# Patient Record
Sex: Male | Born: 1950 | Race: Black or African American | Hispanic: No | Marital: Single | Smoking: Current every day smoker
Health system: Southern US, Community
[De-identification: ages and names within clinical notes are randomized; demographics above are authoritative.]

## PROBLEM LIST (undated history)

## (undated) DIAGNOSIS — E119 Type 2 diabetes mellitus without complications: Secondary | ICD-10-CM

## (undated) DIAGNOSIS — M1990 Unspecified osteoarthritis, unspecified site: Secondary | ICD-10-CM

## (undated) DIAGNOSIS — I10 Essential (primary) hypertension: Secondary | ICD-10-CM

## (undated) DIAGNOSIS — K219 Gastro-esophageal reflux disease without esophagitis: Secondary | ICD-10-CM

## (undated) DIAGNOSIS — I739 Peripheral vascular disease, unspecified: Secondary | ICD-10-CM

## (undated) DIAGNOSIS — K279 Peptic ulcer, site unspecified, unspecified as acute or chronic, without hemorrhage or perforation: Secondary | ICD-10-CM

## (undated) HISTORY — PX: INGUINAL HERNIA REPAIR: SUR1180

## (undated) HISTORY — PX: TONSILLECTOMY: SUR1361

## (undated) HISTORY — DX: Peptic ulcer, site unspecified, unspecified as acute or chronic, without hemorrhage or perforation: K27.9

## (undated) HISTORY — DX: Essential (primary) hypertension: I10

## (undated) HISTORY — PX: COLONOSCOPY: SHX174

## (undated) HISTORY — DX: Unspecified osteoarthritis, unspecified site: M19.90

---

## 1998-03-17 ENCOUNTER — Emergency Department (HOSPITAL_COMMUNITY): Admission: EM | Admit: 1998-03-17 | Discharge: 1998-03-17 | Payer: Self-pay | Admitting: Emergency Medicine

## 1998-05-18 ENCOUNTER — Other Ambulatory Visit: Admission: RE | Admit: 1998-05-18 | Discharge: 1998-05-18 | Payer: Self-pay | Admitting: Family Medicine

## 1998-08-04 ENCOUNTER — Emergency Department (HOSPITAL_COMMUNITY): Admission: EM | Admit: 1998-08-04 | Discharge: 1998-08-04 | Payer: Self-pay | Admitting: Emergency Medicine

## 1998-08-25 ENCOUNTER — Emergency Department (HOSPITAL_COMMUNITY): Admission: EM | Admit: 1998-08-25 | Discharge: 1998-08-25 | Payer: Self-pay | Admitting: Emergency Medicine

## 1998-09-28 ENCOUNTER — Emergency Department (HOSPITAL_COMMUNITY): Admission: EM | Admit: 1998-09-28 | Discharge: 1998-09-28 | Payer: Self-pay | Admitting: Emergency Medicine

## 1998-09-30 ENCOUNTER — Emergency Department (HOSPITAL_COMMUNITY): Admission: EM | Admit: 1998-09-30 | Discharge: 1998-09-30 | Payer: Self-pay | Admitting: Family Medicine

## 1998-11-24 ENCOUNTER — Emergency Department (HOSPITAL_COMMUNITY): Admission: EM | Admit: 1998-11-24 | Discharge: 1998-11-24 | Payer: Self-pay | Admitting: Emergency Medicine

## 1999-11-02 ENCOUNTER — Encounter: Payer: Self-pay | Admitting: Emergency Medicine

## 1999-11-02 ENCOUNTER — Emergency Department (HOSPITAL_COMMUNITY): Admission: EM | Admit: 1999-11-02 | Discharge: 1999-11-02 | Payer: Self-pay | Admitting: Emergency Medicine

## 1999-11-14 ENCOUNTER — Emergency Department (HOSPITAL_COMMUNITY): Admission: EM | Admit: 1999-11-14 | Discharge: 1999-11-14 | Payer: Self-pay | Admitting: Emergency Medicine

## 2000-06-21 ENCOUNTER — Emergency Department (HOSPITAL_COMMUNITY): Admission: EM | Admit: 2000-06-21 | Discharge: 2000-06-21 | Payer: Self-pay

## 2002-06-29 ENCOUNTER — Encounter: Payer: Self-pay | Admitting: Emergency Medicine

## 2002-06-29 ENCOUNTER — Emergency Department (HOSPITAL_COMMUNITY): Admission: EM | Admit: 2002-06-29 | Discharge: 2002-06-29 | Payer: Self-pay | Admitting: Emergency Medicine

## 2003-07-30 ENCOUNTER — Emergency Department (HOSPITAL_COMMUNITY): Admission: EM | Admit: 2003-07-30 | Discharge: 2003-07-30 | Payer: Self-pay | Admitting: Emergency Medicine

## 2003-07-30 ENCOUNTER — Inpatient Hospital Stay (HOSPITAL_COMMUNITY): Admission: AD | Admit: 2003-07-30 | Discharge: 2003-08-01 | Payer: Self-pay | Admitting: Emergency Medicine

## 2003-07-30 ENCOUNTER — Encounter: Payer: Self-pay | Admitting: Emergency Medicine

## 2003-07-31 ENCOUNTER — Encounter: Payer: Self-pay | Admitting: General Surgery

## 2004-08-03 ENCOUNTER — Ambulatory Visit: Payer: Self-pay | Admitting: *Deleted

## 2004-09-14 ENCOUNTER — Emergency Department (HOSPITAL_COMMUNITY): Admission: EM | Admit: 2004-09-14 | Discharge: 2004-09-14 | Payer: Self-pay | Admitting: Emergency Medicine

## 2004-10-26 ENCOUNTER — Ambulatory Visit: Payer: Self-pay | Admitting: Family Medicine

## 2004-10-29 ENCOUNTER — Ambulatory Visit: Payer: Self-pay | Admitting: Family Medicine

## 2004-11-14 ENCOUNTER — Ambulatory Visit: Payer: Self-pay | Admitting: Family Medicine

## 2004-11-21 ENCOUNTER — Ambulatory Visit (HOSPITAL_COMMUNITY): Admission: RE | Admit: 2004-11-21 | Discharge: 2004-11-21 | Payer: Self-pay | Admitting: Family Medicine

## 2005-01-21 ENCOUNTER — Ambulatory Visit: Payer: Self-pay | Admitting: Family Medicine

## 2005-04-26 ENCOUNTER — Ambulatory Visit: Payer: Self-pay | Admitting: Family Medicine

## 2005-07-04 ENCOUNTER — Inpatient Hospital Stay (HOSPITAL_COMMUNITY): Admission: EM | Admit: 2005-07-04 | Discharge: 2005-07-05 | Payer: Self-pay

## 2005-07-22 ENCOUNTER — Ambulatory Visit: Payer: Self-pay | Admitting: Family Medicine

## 2005-10-22 ENCOUNTER — Ambulatory Visit: Payer: Self-pay | Admitting: Family Medicine

## 2006-03-26 ENCOUNTER — Encounter: Payer: Self-pay | Admitting: Emergency Medicine

## 2007-12-13 ENCOUNTER — Emergency Department (HOSPITAL_COMMUNITY): Admission: EM | Admit: 2007-12-13 | Discharge: 2007-12-13 | Payer: Self-pay | Admitting: Emergency Medicine

## 2007-12-22 ENCOUNTER — Emergency Department (HOSPITAL_COMMUNITY): Admission: EM | Admit: 2007-12-22 | Discharge: 2007-12-22 | Payer: Self-pay | Admitting: Emergency Medicine

## 2008-03-21 ENCOUNTER — Ambulatory Visit: Payer: Self-pay | Admitting: Internal Medicine

## 2008-04-13 ENCOUNTER — Ambulatory Visit: Payer: Self-pay | Admitting: Family Medicine

## 2008-04-13 LAB — CONVERTED CEMR LAB
ALT: 15 units/L (ref 0–53)
AST: 16 units/L (ref 0–37)
Creatinine, Ser: 0.71 mg/dL (ref 0.40–1.50)
Potassium: 4.6 meq/L (ref 3.5–5.3)
Total Bilirubin: 0.3 mg/dL (ref 0.3–1.2)
Total Protein: 7.3 g/dL (ref 6.0–8.3)

## 2008-04-19 ENCOUNTER — Ambulatory Visit (HOSPITAL_COMMUNITY): Admission: RE | Admit: 2008-04-19 | Discharge: 2008-04-19 | Payer: Self-pay | Admitting: Family Medicine

## 2008-06-01 ENCOUNTER — Ambulatory Visit: Payer: Self-pay | Admitting: Internal Medicine

## 2008-06-14 ENCOUNTER — Encounter: Admission: RE | Admit: 2008-06-14 | Discharge: 2008-07-13 | Payer: Self-pay | Admitting: Family Medicine

## 2008-08-02 ENCOUNTER — Ambulatory Visit: Payer: Self-pay | Admitting: Internal Medicine

## 2008-09-07 ENCOUNTER — Ambulatory Visit: Payer: Self-pay | Admitting: Internal Medicine

## 2008-09-07 LAB — CONVERTED CEMR LAB
ALT: 21 units/L (ref 0–53)
Albumin: 4.5 g/dL (ref 3.5–5.2)
BUN: 10 mg/dL (ref 6–23)
Calcium: 9.9 mg/dL (ref 8.4–10.5)
Glucose, Bld: 126 mg/dL — ABNORMAL HIGH (ref 70–99)
Phenytoin Lvl: 13.8 ug/mL (ref 10.0–20.0)
Total Protein: 7.3 g/dL (ref 6.0–8.3)

## 2008-09-08 ENCOUNTER — Encounter (INDEPENDENT_AMBULATORY_CARE_PROVIDER_SITE_OTHER): Payer: Self-pay | Admitting: Internal Medicine

## 2008-09-12 ENCOUNTER — Ambulatory Visit (HOSPITAL_COMMUNITY): Admission: RE | Admit: 2008-09-12 | Discharge: 2008-09-12 | Payer: Self-pay | Admitting: Internal Medicine

## 2008-09-27 ENCOUNTER — Ambulatory Visit: Payer: Self-pay | Admitting: Internal Medicine

## 2008-10-06 ENCOUNTER — Ambulatory Visit (HOSPITAL_COMMUNITY): Admission: RE | Admit: 2008-10-06 | Discharge: 2008-10-06 | Payer: Self-pay | Admitting: Internal Medicine

## 2008-11-22 ENCOUNTER — Ambulatory Visit: Payer: Self-pay | Admitting: Internal Medicine

## 2009-01-20 ENCOUNTER — Ambulatory Visit: Payer: Self-pay | Admitting: Internal Medicine

## 2009-01-20 ENCOUNTER — Encounter (INDEPENDENT_AMBULATORY_CARE_PROVIDER_SITE_OTHER): Payer: Self-pay | Admitting: Internal Medicine

## 2009-01-20 LAB — CONVERTED CEMR LAB
Basophils Relative: 1 % (ref 0–1)
Eosinophils Absolute: 0.2 10*3/uL (ref 0.0–0.7)
Eosinophils Relative: 3 % (ref 0–5)
GC Probe Amp, Urine: NEGATIVE
MCV: 93.2 fL (ref 78.0–100.0)
Monocytes Relative: 8 % (ref 3–12)
Neutro Abs: 2.8 10*3/uL (ref 1.7–7.7)
Neutrophils Relative %: 50 % (ref 43–77)
Platelets: 111 10*3/uL — ABNORMAL LOW (ref 150–400)

## 2009-01-21 ENCOUNTER — Encounter (INDEPENDENT_AMBULATORY_CARE_PROVIDER_SITE_OTHER): Payer: Self-pay | Admitting: Internal Medicine

## 2009-02-15 ENCOUNTER — Ambulatory Visit: Payer: Self-pay | Admitting: Internal Medicine

## 2009-02-15 LAB — CONVERTED CEMR LAB
Basophils Absolute: 0 10*3/uL (ref 0.0–0.1)
Eosinophils Relative: 3 % (ref 0–5)
HCT: 41.7 % (ref 39.0–52.0)
Hemoglobin: 13.5 g/dL (ref 13.0–17.0)
Lymphocytes Relative: 39 % (ref 12–46)
Lymphs Abs: 2.7 10*3/uL (ref 0.7–4.0)
Monocytes Absolute: 0.6 10*3/uL (ref 0.1–1.0)
Neutro Abs: 3.5 10*3/uL (ref 1.7–7.7)
RBC: 4.59 M/uL (ref 4.22–5.81)
RDW: 14 % (ref 11.5–15.5)
WBC: 7.1 10*3/uL (ref 4.0–10.5)

## 2009-02-17 ENCOUNTER — Encounter (INDEPENDENT_AMBULATORY_CARE_PROVIDER_SITE_OTHER): Payer: Self-pay | Admitting: Internal Medicine

## 2009-02-17 ENCOUNTER — Ambulatory Visit: Payer: Self-pay | Admitting: Internal Medicine

## 2009-02-17 LAB — CONVERTED CEMR LAB
Basophils Absolute: 0 10*3/uL (ref 0.0–0.1)
Basophils Relative: 1 % (ref 0–1)
Chlamydia, DNA Probe: NEGATIVE
Eosinophils Relative: 3 % (ref 0–5)
GC Probe Amp, Genital: NEGATIVE
HCT: 42.3 % (ref 39.0–52.0)
Hemoglobin: 14.8 g/dL (ref 13.0–17.0)
Iron: 97 ug/dL (ref 42–165)
MCHC: 35 g/dL (ref 30.0–36.0)
Monocytes Absolute: 0.4 10*3/uL (ref 0.1–1.0)
Platelets: 38 10*3/uL — CL (ref 150–400)
RDW: 13.9 % (ref 11.5–15.5)
TIBC: 286 ug/dL (ref 215–435)

## 2009-02-18 ENCOUNTER — Encounter (INDEPENDENT_AMBULATORY_CARE_PROVIDER_SITE_OTHER): Payer: Self-pay | Admitting: Internal Medicine

## 2009-02-18 ENCOUNTER — Telehealth (INDEPENDENT_AMBULATORY_CARE_PROVIDER_SITE_OTHER): Payer: Self-pay | Admitting: Family Medicine

## 2009-02-21 ENCOUNTER — Encounter (INDEPENDENT_AMBULATORY_CARE_PROVIDER_SITE_OTHER): Payer: Self-pay | Admitting: Internal Medicine

## 2009-02-21 ENCOUNTER — Ambulatory Visit: Payer: Self-pay | Admitting: Internal Medicine

## 2009-02-21 LAB — CONVERTED CEMR LAB
Eosinophils Absolute: 0.1 10*3/uL (ref 0.0–0.7)
Eosinophils Relative: 2 % (ref 0–5)
HCT: 43.6 % (ref 39.0–52.0)
Hemoglobin: 14.5 g/dL (ref 13.0–17.0)
Lymphs Abs: 2.3 10*3/uL (ref 0.7–4.0)
MCHC: 33.3 g/dL (ref 30.0–36.0)
MCV: 92.2 fL (ref 78.0–100.0)
Monocytes Absolute: 0.5 10*3/uL (ref 0.1–1.0)
Monocytes Relative: 8 % (ref 3–12)
RBC: 4.73 M/uL (ref 4.22–5.81)
WBC: 6 10*3/uL (ref 4.0–10.5)

## 2009-03-09 ENCOUNTER — Ambulatory Visit: Payer: Self-pay | Admitting: Internal Medicine

## 2009-03-15 ENCOUNTER — Ambulatory Visit: Payer: Self-pay | Admitting: Internal Medicine

## 2009-03-28 ENCOUNTER — Ambulatory Visit: Payer: Self-pay | Admitting: Oncology

## 2009-04-07 LAB — CBC WITH DIFFERENTIAL/PLATELET
Basophils Absolute: 0.1 10*3/uL (ref 0.0–0.1)
EOS%: 2.6 % (ref 0.0–7.0)
HCT: 40 % (ref 38.4–49.9)
HGB: 13.6 g/dL (ref 13.0–17.1)
MCH: 30.9 pg (ref 27.2–33.4)
MCV: 91.2 fL (ref 79.3–98.0)
MONO%: 10 % (ref 0.0–14.0)
NEUT%: 57 % (ref 39.0–75.0)
Platelets: 152 10*3/uL (ref 140–400)

## 2009-04-07 LAB — COMPREHENSIVE METABOLIC PANEL
ALT: 16 U/L (ref 0–53)
Albumin: 3.8 g/dL (ref 3.5–5.2)
BUN: 4 mg/dL — ABNORMAL LOW (ref 6–23)
CO2: 28 mEq/L (ref 19–32)
Calcium: 9.4 mg/dL (ref 8.4–10.5)
Chloride: 103 mEq/L (ref 96–112)
Creatinine, Ser: 0.74 mg/dL (ref 0.40–1.50)
Potassium: 3.8 mEq/L (ref 3.5–5.3)

## 2009-04-07 LAB — LACTATE DEHYDROGENASE: LDH: 129 U/L (ref 94–250)

## 2009-05-31 ENCOUNTER — Ambulatory Visit: Payer: Self-pay | Admitting: Oncology

## 2009-06-01 ENCOUNTER — Ambulatory Visit: Payer: Self-pay | Admitting: Internal Medicine

## 2009-06-09 ENCOUNTER — Ambulatory Visit: Payer: Self-pay | Admitting: Internal Medicine

## 2009-07-26 ENCOUNTER — Ambulatory Visit: Payer: Self-pay | Admitting: Oncology

## 2009-07-28 LAB — CBC WITH DIFFERENTIAL/PLATELET
Basophils Absolute: 0.1 10*3/uL (ref 0.0–0.1)
Eosinophils Absolute: 0.2 10*3/uL (ref 0.0–0.5)
HCT: 37.1 % — ABNORMAL LOW (ref 38.4–49.9)
HGB: 12.5 g/dL — ABNORMAL LOW (ref 13.0–17.1)
MCH: 31.3 pg (ref 27.2–33.4)
MONO#: 0.4 10*3/uL (ref 0.1–0.9)
NEUT%: 47.9 % (ref 39.0–75.0)
WBC: 5.9 10*3/uL (ref 4.0–10.3)
lymph#: 2.4 10*3/uL (ref 0.9–3.3)

## 2009-09-04 ENCOUNTER — Ambulatory Visit: Payer: Self-pay | Admitting: Internal Medicine

## 2009-09-04 LAB — CONVERTED CEMR LAB: GC Probe Amp, Urine: NEGATIVE

## 2009-09-20 ENCOUNTER — Ambulatory Visit: Payer: Self-pay | Admitting: Oncology

## 2009-11-01 ENCOUNTER — Ambulatory Visit: Payer: Self-pay | Admitting: Internal Medicine

## 2009-11-01 LAB — CONVERTED CEMR LAB
ALT: 15 units/L (ref 0–53)
AST: 14 units/L (ref 0–37)
Albumin: 4.3 g/dL (ref 3.5–5.2)
BUN: 11 mg/dL (ref 6–23)
Calcium: 9.4 mg/dL (ref 8.4–10.5)
Chloride: 103 meq/L (ref 96–112)
Eosinophils Relative: 4 % (ref 0–5)
HCT: 45.9 % (ref 39.0–52.0)
Hemoglobin: 15.1 g/dL (ref 13.0–17.0)
Lymphocytes Relative: 31 % (ref 12–46)
Lymphs Abs: 2.3 10*3/uL (ref 0.7–4.0)
Monocytes Relative: 8 % (ref 3–12)
Platelets: 145 10*3/uL — ABNORMAL LOW (ref 150–400)
Potassium: 5.5 meq/L — ABNORMAL HIGH (ref 3.5–5.3)
RBC: 4.81 M/uL (ref 4.22–5.81)
WBC: 7.5 10*3/uL (ref 4.0–10.5)

## 2009-12-11 ENCOUNTER — Ambulatory Visit: Payer: Self-pay | Admitting: Internal Medicine

## 2009-12-28 ENCOUNTER — Encounter: Payer: Self-pay | Admitting: Internal Medicine

## 2009-12-28 ENCOUNTER — Ambulatory Visit: Payer: Self-pay | Admitting: Vascular Surgery

## 2009-12-28 ENCOUNTER — Ambulatory Visit (HOSPITAL_COMMUNITY): Admission: RE | Admit: 2009-12-28 | Discharge: 2009-12-28 | Payer: Self-pay | Admitting: Internal Medicine

## 2009-12-29 ENCOUNTER — Ambulatory Visit: Payer: Self-pay | Admitting: Internal Medicine

## 2010-01-03 ENCOUNTER — Ambulatory Visit: Payer: Self-pay | Admitting: Vascular Surgery

## 2010-01-05 ENCOUNTER — Ambulatory Visit (HOSPITAL_COMMUNITY): Admission: RE | Admit: 2010-01-05 | Discharge: 2010-01-05 | Payer: Self-pay | Admitting: Vascular Surgery

## 2010-01-05 ENCOUNTER — Ambulatory Visit: Payer: Self-pay | Admitting: Vascular Surgery

## 2010-01-12 ENCOUNTER — Ambulatory Visit: Payer: Self-pay | Admitting: Vascular Surgery

## 2010-01-17 ENCOUNTER — Ambulatory Visit: Payer: Self-pay | Admitting: Internal Medicine

## 2010-01-23 ENCOUNTER — Ambulatory Visit (HOSPITAL_COMMUNITY): Admission: RE | Admit: 2010-01-23 | Discharge: 2010-01-23 | Payer: Self-pay | Admitting: Cardiology

## 2010-01-25 ENCOUNTER — Encounter: Admission: RE | Admit: 2010-01-25 | Discharge: 2010-01-25 | Payer: Self-pay | Admitting: Cardiology

## 2010-01-26 ENCOUNTER — Ambulatory Visit: Payer: Self-pay | Admitting: Internal Medicine

## 2010-01-29 ENCOUNTER — Inpatient Hospital Stay (HOSPITAL_BASED_OUTPATIENT_CLINIC_OR_DEPARTMENT_OTHER): Admission: RE | Admit: 2010-01-29 | Discharge: 2010-01-29 | Payer: Self-pay | Admitting: Cardiology

## 2010-02-02 ENCOUNTER — Ambulatory Visit: Payer: Self-pay | Admitting: Vascular Surgery

## 2010-02-05 ENCOUNTER — Encounter: Payer: Self-pay | Admitting: Vascular Surgery

## 2010-02-05 ENCOUNTER — Ambulatory Visit: Payer: Self-pay | Admitting: Vascular Surgery

## 2010-02-05 ENCOUNTER — Inpatient Hospital Stay (HOSPITAL_COMMUNITY): Admission: RE | Admit: 2010-02-05 | Discharge: 2010-02-09 | Payer: Self-pay | Admitting: Vascular Surgery

## 2010-02-06 ENCOUNTER — Encounter: Payer: Self-pay | Admitting: Vascular Surgery

## 2010-02-13 ENCOUNTER — Ambulatory Visit: Payer: Self-pay | Admitting: Vascular Surgery

## 2010-03-02 ENCOUNTER — Ambulatory Visit: Payer: Self-pay | Admitting: Vascular Surgery

## 2010-04-20 ENCOUNTER — Ambulatory Visit: Payer: Self-pay | Admitting: Internal Medicine

## 2010-04-27 ENCOUNTER — Ambulatory Visit: Payer: Self-pay | Admitting: Vascular Surgery

## 2010-06-09 ENCOUNTER — Inpatient Hospital Stay (HOSPITAL_COMMUNITY): Admission: EM | Admit: 2010-06-09 | Discharge: 2010-06-13 | Payer: Self-pay | Admitting: Emergency Medicine

## 2010-06-22 ENCOUNTER — Emergency Department (HOSPITAL_COMMUNITY): Admission: EM | Admit: 2010-06-22 | Discharge: 2010-06-23 | Payer: Self-pay | Admitting: Emergency Medicine

## 2010-06-28 ENCOUNTER — Emergency Department (HOSPITAL_COMMUNITY): Admission: EM | Admit: 2010-06-28 | Discharge: 2010-06-28 | Payer: Self-pay | Admitting: Emergency Medicine

## 2010-06-29 ENCOUNTER — Ambulatory Visit: Payer: Self-pay | Admitting: Internal Medicine

## 2010-08-10 ENCOUNTER — Ambulatory Visit: Payer: Self-pay | Admitting: Vascular Surgery

## 2010-08-13 ENCOUNTER — Encounter (INDEPENDENT_AMBULATORY_CARE_PROVIDER_SITE_OTHER): Payer: Self-pay | Admitting: Internal Medicine

## 2010-08-13 LAB — CONVERTED CEMR LAB
LH: 2.8 milliintl units/mL (ref 1.5–9.3)
TSH: 0.612 microintl units/mL (ref 0.350–4.500)

## 2010-08-15 ENCOUNTER — Ambulatory Visit (HOSPITAL_COMMUNITY): Admission: RE | Admit: 2010-08-15 | Discharge: 2010-08-15 | Payer: Self-pay | Admitting: Internal Medicine

## 2010-11-12 ENCOUNTER — Encounter (INDEPENDENT_AMBULATORY_CARE_PROVIDER_SITE_OTHER): Payer: Self-pay | Admitting: Family Medicine

## 2010-11-12 LAB — CONVERTED CEMR LAB
AST: 21 units/L (ref 0–37)
Albumin: 4.6 g/dL (ref 3.5–5.2)
BUN: 9 mg/dL (ref 6–23)
Calcium: 10 mg/dL (ref 8.4–10.5)
Chloride: 102 meq/L (ref 96–112)
Glucose, Bld: 168 mg/dL — ABNORMAL HIGH (ref 70–99)
HDL: 38 mg/dL — ABNORMAL LOW (ref >39)
Hgb A1c MFr Bld: 7.9 % — ABNORMAL HIGH (ref <5.7)
Potassium: 5 meq/L (ref 3.5–5.3)
Sodium: 141 meq/L (ref 135–145)
Total Protein: 7.4 g/dL (ref 6.0–8.3)

## 2010-12-16 ENCOUNTER — Encounter: Payer: Self-pay | Admitting: Internal Medicine

## 2011-02-04 ENCOUNTER — Other Ambulatory Visit: Payer: Self-pay | Admitting: Internal Medicine

## 2011-02-04 NOTE — Telephone Encounter (Signed)
Patient does not have a PCP listed.  Seems he followed with Dr. Reche Dixon at [facility] and assume he remains a patient at [facility].  Will route back to [facility] and forward to Dr. Reche Dixon to clarify.

## 2011-02-04 NOTE — Telephone Encounter (Signed)
Patient will need to remain at [facility] for ongoing care and medication refills.

## 2011-02-07 LAB — CREATININE, SERUM
Creatinine, Ser: 0.67 mg/dL (ref 0.4–1.5)
GFR calc Af Amer: >60 mL/min (ref >60)
GFR calc non Af Amer: >60 mL/min (ref >60)

## 2011-02-08 LAB — POCT I-STAT, CHEM 8
Calcium, Ion: 1.18 mmol/L (ref 1.12–1.32)
Chloride: 103 mEq/L (ref 96–112)
Glucose, Bld: 261 mg/dL — ABNORMAL HIGH (ref 70–99)
HCT: 47 % (ref 39.0–52.0)
Hemoglobin: 16 g/dL (ref 13.0–17.0)
TCO2: 24 mmol/L (ref 0–100)

## 2011-02-09 LAB — COMPREHENSIVE METABOLIC PANEL
ALT: 11 U/L (ref 0–53)
ALT: 19 U/L (ref 0–53)
AST: 19 U/L (ref 0–37)
Albumin: 3 g/dL — ABNORMAL LOW (ref 3.5–5.2)
Alkaline Phosphatase: 75 U/L (ref 39–117)
Alkaline Phosphatase: 77 U/L (ref 39–117)
Alkaline Phosphatase: 81 U/L (ref 39–117)
Alkaline Phosphatase: 81 U/L (ref 39–117)
BUN: 13 mg/dL (ref 6–23)
BUN: 14 mg/dL (ref 6–23)
BUN: 8 mg/dL (ref 6–23)
BUN: 8 mg/dL (ref 6–23)
CO2: 28 mEq/L (ref 19–32)
CO2: 30 mEq/L (ref 19–32)
Calcium: 8.2 mg/dL — ABNORMAL LOW (ref 8.4–10.5)
Calcium: 8.6 mg/dL (ref 8.4–10.5)
Chloride: 102 mEq/L (ref 96–112)
Chloride: 102 mEq/L (ref 96–112)
Chloride: 102 mEq/L (ref 96–112)
Chloride: 102 mEq/L (ref 96–112)
Creatinine, Ser: 0.83 mg/dL (ref 0.4–1.5)
GFR calc Af Amer: >60 mL/min (ref >60)
GFR calc non Af Amer: >60 mL/min (ref >60)
GFR calc non Af Amer: >60 mL/min (ref >60)
GFR calc non Af Amer: >60 mL/min (ref >60)
Glucose, Bld: 112 mg/dL — ABNORMAL HIGH (ref 70–99)
Glucose, Bld: 153 mg/dL — ABNORMAL HIGH (ref 70–99)
Glucose, Bld: 200 mg/dL — ABNORMAL HIGH (ref 70–99)
Glucose, Bld: 239 mg/dL — ABNORMAL HIGH (ref 70–99)
Potassium: 4.1 mEq/L (ref 3.5–5.1)
Potassium: 4.3 mEq/L (ref 3.5–5.1)
Potassium: 4.5 mEq/L (ref 3.5–5.1)
Sodium: 134 mEq/L — ABNORMAL LOW (ref 135–145)
Sodium: 137 mEq/L (ref 135–145)
Total Bilirubin: 0.3 mg/dL (ref 0.3–1.2)
Total Bilirubin: 0.6 mg/dL (ref 0.3–1.2)
Total Bilirubin: 0.7 mg/dL (ref 0.3–1.2)
Total Bilirubin: 0.9 mg/dL (ref 0.3–1.2)
Total Protein: 5.6 g/dL — ABNORMAL LOW (ref 6.0–8.3)
Total Protein: 5.9 g/dL — ABNORMAL LOW (ref 6.0–8.3)

## 2011-02-09 LAB — LACTIC ACID, PLASMA: Lactic Acid, Venous: 1.9 mmol/L (ref 0.5–2.2)

## 2011-02-09 LAB — DIFFERENTIAL
Basophils Absolute: 0 10*3/uL (ref 0.0–0.1)
Basophils Absolute: 0 10*3/uL (ref 0.0–0.1)
Basophils Absolute: 0.1 10*3/uL (ref 0.0–0.1)
Basophils Absolute: 0.1 10*3/uL (ref 0.0–0.1)
Basophils Relative: 0 % (ref 0–1)
Basophils Relative: 1 % (ref 0–1)
Basophils Relative: 1 % (ref 0–1)
Basophils Relative: 1 % (ref 0–1)
Eosinophils Absolute: 0.1 10*3/uL (ref 0.0–0.7)
Eosinophils Absolute: 0.2 10*3/uL (ref 0.0–0.7)
Eosinophils Relative: 2 % (ref 0–5)
Eosinophils Relative: 3 % (ref 0–5)
Lymphocytes Relative: 22 % (ref 12–46)
Lymphs Abs: 1.2 10*3/uL (ref 0.7–4.0)
Monocytes Absolute: 0.4 10*3/uL (ref 0.1–1.0)
Monocytes Relative: 14 % — ABNORMAL HIGH (ref 3–12)
Monocytes Relative: 8 % (ref 3–12)
Neutro Abs: 3.4 10*3/uL (ref 1.7–7.7)
Neutro Abs: 6.1 10*3/uL (ref 1.7–7.7)
Neutrophils Relative %: 62 % (ref 43–77)
Neutrophils Relative %: 65 % (ref 43–77)
Neutrophils Relative %: 76 % (ref 43–77)

## 2011-02-09 LAB — URINALYSIS, ROUTINE W REFLEX MICROSCOPIC
Bilirubin Urine: NEGATIVE
Hgb urine dipstick: NEGATIVE
Ketones, ur: NEGATIVE mg/dL
Protein, ur: 100 mg/dL — AB
Specific Gravity, Urine: 1.028 (ref 1.005–1.030)
Urobilinogen, UA: 0.2 mg/dL (ref 0.0–1.0)

## 2011-02-09 LAB — CBC
HCT: 34.7 % — ABNORMAL LOW (ref 39.0–52.0)
HCT: 39.5 % (ref 39.0–52.0)
HCT: 44.3 % (ref 39.0–52.0)
HCT: 46.2 % (ref 39.0–52.0)
HCT: 47.6 % (ref 39.0–52.0)
Hemoglobin: 13.4 g/dL (ref 13.0–17.0)
Hemoglobin: 14.9 g/dL (ref 13.0–17.0)
MCH: 31.1 pg (ref 26.0–34.0)
MCHC: 33.8 g/dL (ref 30.0–36.0)
MCHC: 33.8 g/dL (ref 30.0–36.0)
MCV: 92.1 fL (ref 78.0–100.0)
MCV: 92.8 fL (ref 78.0–100.0)
MCV: 92.9 fL (ref 78.0–100.0)
MCV: 92.9 fL (ref 78.0–100.0)
Platelets: 119 10*3/uL — ABNORMAL LOW (ref 150–400)
RBC: 3.74 MIL/uL — ABNORMAL LOW (ref 4.22–5.81)
RBC: 4.72 MIL/uL (ref 4.22–5.81)
RDW: 13.6 % (ref 11.5–15.5)
RDW: 13.9 % (ref 11.5–15.5)
RDW: 14.3 % (ref 11.5–15.5)
WBC: 4.6 10*3/uL (ref 4.0–10.5)
WBC: 8.1 10*3/uL (ref 4.0–10.5)

## 2011-02-09 LAB — GLUCOSE, CAPILLARY
Glucose-Capillary: 142 mg/dL — ABNORMAL HIGH (ref 70–99)
Glucose-Capillary: 149 mg/dL — ABNORMAL HIGH (ref 70–99)
Glucose-Capillary: 181 mg/dL — ABNORMAL HIGH (ref 70–99)
Glucose-Capillary: 190 mg/dL — ABNORMAL HIGH (ref 70–99)
Glucose-Capillary: 219 mg/dL — ABNORMAL HIGH (ref 70–99)
Glucose-Capillary: 235 mg/dL — ABNORMAL HIGH (ref 70–99)

## 2011-02-09 LAB — MAGNESIUM
Magnesium: 1.8 mg/dL (ref 1.5–2.5)
Magnesium: 1.8 mg/dL (ref 1.5–2.5)
Magnesium: 1.9 mg/dL (ref 1.5–2.5)

## 2011-02-09 LAB — HEMOGLOBIN A1C: Hgb A1c MFr Bld: 7.5 % — ABNORMAL HIGH (ref <5.7)

## 2011-02-09 LAB — PHENYTOIN LEVEL, TOTAL: Phenytoin Lvl: 8.3 ug/mL — ABNORMAL LOW (ref 10.0–20.0)

## 2011-02-09 LAB — URINE MICROSCOPIC-ADD ON

## 2011-02-09 LAB — LIPASE, BLOOD: Lipase: 31 U/L (ref 11–59)

## 2011-02-09 LAB — HEMOCCULT GUIAC POC 1CARD (OFFICE): Fecal Occult Bld: NEGATIVE

## 2011-02-11 NOTE — Telephone Encounter (Signed)
Call to [facility] spoke with a Pharmacy Tech.  Informed her that pt goes to [facility].

## 2011-02-14 ENCOUNTER — Ambulatory Visit (INDEPENDENT_AMBULATORY_CARE_PROVIDER_SITE_OTHER): Payer: Self-pay

## 2011-02-14 ENCOUNTER — Encounter (INDEPENDENT_AMBULATORY_CARE_PROVIDER_SITE_OTHER): Payer: Self-pay

## 2011-02-14 DIAGNOSIS — Z48812 Encounter for surgical aftercare following surgery on the circulatory system: Secondary | ICD-10-CM

## 2011-02-14 DIAGNOSIS — I739 Peripheral vascular disease, unspecified: Secondary | ICD-10-CM

## 2011-02-14 LAB — POCT I-STAT, CHEM 8
BUN: 10 mg/dL (ref 6–23)
Calcium, Ion: 1.17 mmol/L (ref 1.12–1.32)
Chloride: 98 mEq/L (ref 96–112)
HCT: 47 % (ref 39.0–52.0)
Potassium: 4.3 mEq/L (ref 3.5–5.1)
Sodium: 132 mEq/L — ABNORMAL LOW (ref 135–145)

## 2011-02-14 LAB — GLUCOSE, CAPILLARY: Glucose-Capillary: 215 mg/dL — ABNORMAL HIGH (ref 70–99)

## 2011-02-14 NOTE — Assessment & Plan Note (Signed)
OFFICE VISIT  [name] [name], [name] [name] DOB:  [birth year]                                       02/14/2011 EAVWU#:[id number]  CHIEF COMPLAINT:  Follow up left-to-right fem-fem bypass.  HISTORY OF PRESENT ILLNESS:  Patient is a 60 year old gentleman who had a left common femoral endarterectomy, profunda endarterectomy, and left- to-right fem-fem bypass with left common iliac artery balloon angioplasty and stenting on 02/05/2010 by Dr. Arbie Cookey. Postoperatively, the patient did well.  He had bilateral femoral nerve pain and numbness with pain in the knees.  Patient states he has been back to work about 3- 4 months and is doing well.  He has developed in the last month or so some sharp, shooting pains in both his knees with increased sensation in the thighs, as his femoral nerve sensation is returning.  The patient states that initially when he is walking, he has some pain in his thighs and knees, but after walking for longer periods of time, his pain is improved.  He denies any claudication symptoms.  He also states he has been doing some muscle stretching, which is helping, and that he is getting more muscle tone and muscle mass in both lower extremities.  The patient had ABIs today, which were 0.64 on the right, inaudible on the left.  PHYSICAL EXAMINATION:  This is a tall, thin gentleman in no acute distress.  His heart rate is 70.  His sats were 100%.  His respiratory rate was 10.  He had a good palpable graft pulse in his fem-fem bypass. Both feet were warm.  He had good venous refill in bilateral lower extremities.  He had a positive posterior tibial Doppler signal bilaterally.  He had no DP signal bilaterally.  All his wounds were well- healed.  ASSESSMENT:  Femoral nerve paresthesias with increasing sharp, shooting pains in the thighs to the knees.  PLAN:  The patient was given a prescription for some Neurontin 300 mg at bedtime, #30, with 1 refill.  He is  also given some Tylox 5/325 one tablet every 6 hours as needed for pain, #20, with no refills.  This should not be refilled again without conferring with Dr. Arbie Cookey.  Della Goo, PA-C  Catlin E. Fields, MD Electronically Signed  RR/MEDQ  D:  02/14/2011  T:  02/14/2011  Job:  [phone]

## 2011-02-17 LAB — POCT I-STAT GLUCOSE: Glucose, Bld: 213 mg/dL — ABNORMAL HIGH (ref 70–99)

## 2011-02-17 LAB — GLUCOSE, CAPILLARY: Glucose-Capillary: 197 mg/dL — ABNORMAL HIGH (ref 70–99)

## 2011-02-18 LAB — URINALYSIS, ROUTINE W REFLEX MICROSCOPIC
Glucose, UA: 100 mg/dL — AB
Protein, ur: NEGATIVE mg/dL
pH: 6 (ref 5.0–8.0)

## 2011-02-18 LAB — GLUCOSE, CAPILLARY
Glucose-Capillary: 133 mg/dL — ABNORMAL HIGH (ref 70–99)
Glucose-Capillary: 152 mg/dL — ABNORMAL HIGH (ref 70–99)
Glucose-Capillary: 173 mg/dL — ABNORMAL HIGH (ref 70–99)
Glucose-Capillary: 175 mg/dL — ABNORMAL HIGH (ref 70–99)
Glucose-Capillary: 176 mg/dL — ABNORMAL HIGH (ref 70–99)
Glucose-Capillary: 192 mg/dL — ABNORMAL HIGH (ref 70–99)
Glucose-Capillary: 197 mg/dL — ABNORMAL HIGH (ref 70–99)
Glucose-Capillary: 202 mg/dL — ABNORMAL HIGH (ref 70–99)
Glucose-Capillary: 205 mg/dL — ABNORMAL HIGH (ref 70–99)
Glucose-Capillary: 224 mg/dL — ABNORMAL HIGH (ref 70–99)
Glucose-Capillary: 225 mg/dL — ABNORMAL HIGH (ref 70–99)
Glucose-Capillary: 226 mg/dL — ABNORMAL HIGH (ref 70–99)
Glucose-Capillary: 95 mg/dL (ref 70–99)

## 2011-02-18 LAB — COMPREHENSIVE METABOLIC PANEL
ALT: 18 U/L (ref 0–53)
AST: 25 U/L (ref 0–37)
Calcium: 9.6 mg/dL (ref 8.4–10.5)
GFR calc Af Amer: >60 mL/min (ref >60)
Sodium: 136 mEq/L (ref 135–145)
Total Protein: 6.8 g/dL (ref 6.0–8.3)

## 2011-02-18 LAB — CBC
Hemoglobin: 11.5 g/dL — ABNORMAL LOW (ref 13.0–17.0)
MCHC: 34.4 g/dL (ref 30.0–36.0)
MCHC: 34.8 g/dL (ref 30.0–36.0)
MCV: 94.2 fL (ref 78.0–100.0)
Platelets: 130 10*3/uL — ABNORMAL LOW (ref 150–400)
Platelets: 139 10*3/uL — ABNORMAL LOW (ref 150–400)
RDW: 14 % (ref 11.5–15.5)
RDW: 14.2 % (ref 11.5–15.5)
WBC: 7.8 10*3/uL (ref 4.0–10.5)

## 2011-02-18 LAB — BASIC METABOLIC PANEL
BUN: 4 mg/dL — ABNORMAL LOW (ref 6–23)
BUN: 6 mg/dL (ref 6–23)
CO2: 27 mEq/L (ref 19–32)
CO2: 28 mEq/L (ref 19–32)
Calcium: 8.4 mg/dL (ref 8.4–10.5)
Calcium: 8.9 mg/dL (ref 8.4–10.5)
Calcium: 8.9 mg/dL (ref 8.4–10.5)
Chloride: 101 mEq/L (ref 96–112)
Chloride: 104 mEq/L (ref 96–112)
Creatinine, Ser: 0.67 mg/dL (ref 0.4–1.5)
GFR calc Af Amer: >60 mL/min (ref >60)
GFR calc non Af Amer: >60 mL/min (ref >60)
Glucose, Bld: 123 mg/dL — ABNORMAL HIGH (ref 70–99)
Glucose, Bld: 212 mg/dL — ABNORMAL HIGH (ref 70–99)
Potassium: 4.1 mEq/L (ref 3.5–5.1)
Sodium: 133 mEq/L — ABNORMAL LOW (ref 135–145)
Sodium: 139 mEq/L (ref 135–145)

## 2011-02-18 LAB — HEMOGLOBIN A1C
Hgb A1c MFr Bld: 9.1 % — ABNORMAL HIGH (ref 4.6–6.1)
Mean Plasma Glucose: 214 mg/dL

## 2011-02-18 LAB — TYPE AND SCREEN: Antibody Screen: NEGATIVE

## 2011-04-09 ENCOUNTER — Other Ambulatory Visit: Payer: Self-pay | Admitting: Vascular Surgery

## 2011-04-09 ENCOUNTER — Emergency Department (HOSPITAL_COMMUNITY)
Admission: EM | Admit: 2011-04-09 | Discharge: 2011-04-09 | Disposition: A | Discharge status: Other Acute Inpatient Hospital | Payer: Medicaid Other | Source: Home / Self Care | Attending: Emergency Medicine | Admitting: Emergency Medicine

## 2011-04-09 ENCOUNTER — Inpatient Hospital Stay (HOSPITAL_COMMUNITY)
Admission: EM | Admit: 2011-04-09 | Discharge: 2011-04-12 | DRG: 253 | Disposition: A | Discharge status: Home / Self Care | Payer: Medicaid Other | Attending: Vascular Surgery | Admitting: Vascular Surgery

## 2011-04-09 DIAGNOSIS — E785 Hyperlipidemia, unspecified: Secondary | ICD-10-CM | POA: Insufficient documentation

## 2011-04-09 DIAGNOSIS — T82898A Other specified complication of vascular prosthetic devices, implants and grafts, initial encounter: Secondary | ICD-10-CM

## 2011-04-09 DIAGNOSIS — G40802 Other epilepsy, not intractable, without status epilepticus: Secondary | ICD-10-CM | POA: Insufficient documentation

## 2011-04-09 DIAGNOSIS — Z880 Allergy status to penicillin: Secondary | ICD-10-CM

## 2011-04-09 DIAGNOSIS — E119 Type 2 diabetes mellitus without complications: Secondary | ICD-10-CM | POA: Insufficient documentation

## 2011-04-09 DIAGNOSIS — I10 Essential (primary) hypertension: Secondary | ICD-10-CM | POA: Diagnosis present

## 2011-04-09 DIAGNOSIS — M79609 Pain in unspecified limb: Secondary | ICD-10-CM

## 2011-04-09 DIAGNOSIS — F17200 Nicotine dependence, unspecified, uncomplicated: Secondary | ICD-10-CM | POA: Diagnosis present

## 2011-04-09 DIAGNOSIS — I743 Embolism and thrombosis of arteries of the lower extremities: Secondary | ICD-10-CM | POA: Diagnosis present

## 2011-04-09 DIAGNOSIS — Z01812 Encounter for preprocedural laboratory examination: Secondary | ICD-10-CM

## 2011-04-09 LAB — GLUCOSE, CAPILLARY: Glucose-Capillary: 114 mg/dL — ABNORMAL HIGH (ref 70–99)

## 2011-04-09 LAB — DIFFERENTIAL
Basophils Absolute: 0.1 10*3/uL (ref 0.0–0.1)
Basophils Relative: 1 % (ref 0–1)
Eosinophils Relative: 4 % (ref 0–5)
Monocytes Absolute: 0.4 10*3/uL (ref 0.1–1.0)

## 2011-04-09 LAB — POCT I-STAT, CHEM 8
Creatinine, Ser: 0.8 mg/dL (ref 0.4–1.5)
Glucose, Bld: 127 mg/dL — ABNORMAL HIGH (ref 70–99)
Hemoglobin: 15.3 g/dL (ref 13.0–17.0)
TCO2: 27 mmol/L (ref 0–100)

## 2011-04-09 LAB — URINALYSIS, ROUTINE W REFLEX MICROSCOPIC
Bilirubin Urine: NEGATIVE
Hgb urine dipstick: NEGATIVE
Ketones, ur: NEGATIVE mg/dL
Protein, ur: NEGATIVE mg/dL
Urobilinogen, UA: 0.2 mg/dL (ref 0.0–1.0)

## 2011-04-09 LAB — CBC
HCT: 42.6 % (ref 39.0–52.0)
MCH: 30.8 pg (ref 26.0–34.0)
MCHC: 33.3 g/dL (ref 30.0–36.0)
RDW: 13.4 % (ref 11.5–15.5)

## 2011-04-09 NOTE — Consult Note (Signed)
NEW PATIENT CONSULTATION   [name] [name], [name] [name]  DOB:  May 22, 1951                                       01/03/2010  GNFAO#:[id number]   Patient presents today for evaluation of severe lower extremity arterial  insufficiency.  He is a 60 year old gentleman with progressively severe  pain in both lower extremities.  He has a long history of calf  claudication, and this has now progressed to a burning and stinging  sensation that awakens him at night.  He does not have any history of  tissue loss.  He does report that the walking symptoms are relieved with  rest and do occur in his calves.  He does not have any buttock  claudication.   PAST HISTORY:  Significant for non-insulin diabetes for approximately 20  years.  He does have elevated blood pressure and elevated cholesterol.  He denies any prior history of congestive heart failure or myocardial  infarction.   FAMILY HISTORY:  Positive for premature atherosclerotic disease in his  mother and brother and sister.   SOCIAL HISTORY:  He is single.  He does have 2 children.  He works as a  Surveyor, minerals.  He currently smokes.  He does not drink alcohol on a  regular basis.   REVIEW OF SYSTEMS:  Negative for weight loss or weight gain.  His weight  is reported at 165 pounds.  He is 6 feet 3 inches tall.  CARDIAC:  Positive for palpitations, shortness of breath with exertion.  PULMONARY:  With wheezing.  GI:  Constipation.  GU:  Urinary frequency.  VASCULAR:  Pain in his legs with walking and with lying flat.  NEUROLOGIC:  History of seizure.  MUSCULOSKELETAL:  History of arthritic and joint pain.  PSYCHIATRIC:  Negative.  ENT:  Negative.  HEMATOLOGIC:  Negative.  Does have a history of rash in his axillae and  chest.   PHYSICAL EXAMINATION:  A well-developed, thin black male appearing his  stated age.  His blood pressure is 125/86, pulse 71, respirations 16.  Generally, he is a well-nourished, thin black male in  no acute distress.  HEENT is normal.  Chest is clear bilaterally without wheezes.  Cardiovascular:  He has no carotid bruits.  Heart is regular rate and  rhythm without murmur.  He does have 1-2+ femoral pulses with extremely  calcified thickened arteries.  He has absent popliteal and distal  pulses.  Abdominal exam is nontender.  No masses and no bruit.  Musculoskeletal:  No major deformities or cyanosis.  Neurologic:  No  focal weakness or paresthesias.  Skin:  No ulcers or rashes.   He did have noninvasive vascular laboratory studies, and I have reviewed  this, from [facility] on 12/28/09.  This shows an audible flow  in his tibial vessels bilaterally.  He has monophasic flow in his  femoral arteries bilaterally, which suggests some proximal and distal  disease.   I discussed this at length with patient.  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  I  have recommended that he undergo arteriography for further evaluation of  this.  I explained that he may be a candidate for a percutaneous  intervention and more likely would require surgical intervention.  He  understands and we will schedule him for  outpatient arteriogram with Dr.  Fabienne Bruns on 02/11 at [facility].     Larina Earthly, M.D.  Electronically Signed   TFE/MEDQ  D:  01/03/2010  T:  01/03/2010  Job:  [id number]   cc:   Dineen Kid. Reche Dixon, M.D.

## 2011-04-09 NOTE — Assessment & Plan Note (Signed)
OFFICE VISIT   [name] [name], [name] [name]  DOB:  [birth year]                                       03/02/2010  ZOXWR#:[id number]   Patient presents today for followup of his left common iliac artery  angioplasty and stenting and left-right fem-fem bypass on 02/05/2010.  He has excellent 2+ femoral pulses.  Groin incisions are healing quite  nicely.  He reports that he is having marked improvement in his  claudication symptoms bilaterally.   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  He will continue his  walking program.  We plan to see him again in 1 month with repeat ankle-  arm indices.     Larina Earthly, M.D.  Electronically Signed   TFE/MEDQ  D:  03/02/2010  T:  03/06/2010  Job:  [id number]

## 2011-04-09 NOTE — Assessment & Plan Note (Signed)
OFFICE VISIT   DEVARIS, QUIRK  DOB:  Mar 30, 1951                                       04/27/2010  ZOXWR#:[id number]   The patient presents today for followup of his left common iliac artery  angioplasty and stenting and left-to-right fem-fem bypass in the  operating room on 02/05/2010.  He continues to do well.  He reports that  although he is not walking like a 60 year old, he is certainly walking  like a 60 year old.  He is his usual animated self.  He reports no rest  pain.  Does have some tingling in his medial right thigh.   PHYSICAL EXAM:  Reveals excellent fem-fem graft pulse with normal  incision healing bilaterally.  Ankle arm index is 0.62 on right 0.59 on  the left.   He will continue his walking program.  He reports that he was able to  paint to all day yesterday in the heat and is quite pleased with this  outcome.  He will continue his usual walking program and we will see him  again on a p.r.n. basis.  He was given a prescription for Ultram 50 mg,  no refills, due to his right leg thigh tingling.     Larina Earthly, M.D.  Electronically Signed   TFE/MEDQ  D:  04/27/2010  T:  04/27/2010  Job:  [id number]

## 2011-04-09 NOTE — Assessment & Plan Note (Signed)
OFFICE VISIT   ROXANNE, ORNER  DOB:  08-07-1951                                       02/13/2010  ZOXWR#:[id number]   DATE OF SURGERY:  02/05/2010.   CHIEF COMPLAINT:  Left groin and swelling, status post fem-fem bypass.   HISTORY OF PRESENT ILLNESS:  Patient is a 60 year old gentleman who  underwent a left common iliac below-knee angioplasty and stenting as  well as a left common femoral artery endarterectomy and profundus  endarterectomy with a left-to-right fem-fem bypass with 8 mm Hemashield  graft on 02/05/2010.  The patient states he has been doing well at home.  He has had no pain in his legs.  His feet are feeling well.  He has had  less numbness and tingling with the gabapentin at home.  However, a few  days ago, he sneezed and felt pain in his left groin and noticed some  ridge at the incision and some swelling but no increased pain.  He comes  in today for follow-up and wound check.  Of significance, the patient is  a diabetic and has diabetic neuropathy.   PHYSICAL EXAMINATION:  This is a well-developed, well-nourished  gentleman in no acute distress.  His heart rate is 80.  His sats are  100%.  His respiratory rate is 10.  Bilateral groin wounds are healing  well.  The ridge is just a normal variant from closure of the wound.  There is no drainage, erythema or other issues with the wound.  He has a  good graft pulse.  He has dopplerable posterior tibials and dorsalis  pedis signals bilaterally.  Both the feet are warm and pink.  He has no  lesions on his feet or heels.   ASSESSMENT:  This is a wound check, status post left-to-right femoral-  femoral bypass with good graft pulse and no signs of infection from the  left groin wound.  There is normal suture ridge at the left groin with  no evidence of pseudoaneurysm or infection.   PLAN:  Have the patient follow up with Dr. Arbie Cookey at his normal  postoperative check in about 3 weeks with  ABI's.   Della Goo, PA-C   Quita Skye. Hart Rochester, M.D.  Electronically Signed   RR/MEDQ  D:  02/13/2010  T:  02/14/2010  Job:  [id number]

## 2011-04-09 NOTE — Assessment & Plan Note (Signed)
OFFICE VISIT   RAFAEL, SALWAY  DOB:  January 22, 1951                                       01/12/2010  HQION#:[id number]   Patient presents today for continued discussion regarding lower  extremity arterial sufficiency, right greater than left.  He is status  post arteriogram on 01/05/10.  I reviewed this with patient.   His physical exam is unchanged.  He continues to have a 1+ to 2+ left  femoral pulse.  He has a 1+ right femoral pulse.  He has absent distal  pulses.  He does have continued severe claudication and mild rest pain  on his right foot.   I have discussed the options with patient.  I explained the option of  aortobifemoral bypass grafting or left common iliac artery angioplasty  of a focal stenosis and left-to-right fem-fem bypass.  I certainly would  recommend this as the initial treatment.  He has focal disease in his  left common iliac artery just at the takeoff of his internal iliac  artery.  He does report impotence, and I explained that this may or may  not improve this.  He is to see cardiology for preoperative clearance  and is tentatively scheduled for a left iliac angioplasty and left-right  fem-fem bypass on 01/28.     Larina Earthly, M.D.  Electronically Signed   TFE/MEDQ  D:  01/12/2010  T:  01/16/2010  Job:  [id number]

## 2011-04-09 NOTE — Assessment & Plan Note (Signed)
OFFICE VISIT   [name] [name], [name] [name]  DOB:  December 22, 1950                                       02/02/2010  AVWUJ#:[id number]   The patient presents today for continued discussion regarding his lower  extremity arterial insufficiency.  He reports that he is having  progressively severe pain associated with this.  He is requesting pain  medication, and I have written him a prescription for Tylox 30, no  refills, 1-2 q.4 h.  He did undergo a Cardiolite which was not normal  and therefore underwent outpatient coronary angiogram on March 7.  He  reports that this was no critical disease.  This was done at the [facility] and we are having some difficulty obtaining the results of  this but will confirm that this is indeed correct.   His physical exam is otherwise unchanged.  He does not have palpable  femoral pulses.   I did again explain the options to the patient in great detail with the  option of aortofemoral bypass grafting or left common iliac artery  angioplasty and left-to-right fem-fem bypass.  I would recommend this  due to the marked the lesser degree of the procedure and would expect  excellent long-term durability from this.  He understands the risk of  failure and eventual need for aortofemoral grafting.  We will schedule  this at his convenience March 14 at [facility].     Larina Earthly, M.D.  Electronically Signed   TFE/MEDQ  D:  02/02/2010  T:  02/02/2010  Job:  [id number]   cc:   Colleen Can. Deborah Chalk, M.D.  Dineen Kid Reche Dixon, M.D.

## 2011-04-10 ENCOUNTER — Inpatient Hospital Stay (HOSPITAL_COMMUNITY): Payer: Medicaid Other

## 2011-04-10 ENCOUNTER — Encounter (HOSPITAL_COMMUNITY): Payer: Self-pay | Admitting: Radiology

## 2011-04-10 DIAGNOSIS — M79609 Pain in unspecified limb: Secondary | ICD-10-CM

## 2011-04-10 LAB — BASIC METABOLIC PANEL
BUN: 5 mg/dL — ABNORMAL LOW (ref 6–23)
CO2: 28 mEq/L (ref 19–32)
Calcium: 8.5 mg/dL (ref 8.4–10.5)
Creatinine, Ser: 0.55 mg/dL (ref 0.4–1.5)
GFR calc Af Amer: >60 mL/min (ref >60)
Glucose, Bld: 209 mg/dL — ABNORMAL HIGH (ref 70–99)

## 2011-04-10 LAB — CBC
Hemoglobin: 11.9 g/dL — ABNORMAL LOW (ref 13.0–17.0)
MCH: 30.7 pg (ref 26.0–34.0)
MCHC: 33.5 g/dL (ref 30.0–36.0)
MCV: 91.5 fL (ref 78.0–100.0)

## 2011-04-10 LAB — GLUCOSE, CAPILLARY: Glucose-Capillary: 170 mg/dL — ABNORMAL HIGH (ref 70–99)

## 2011-04-10 LAB — HEMOGLOBIN A1C
Hgb A1c MFr Bld: 6.5 % — ABNORMAL HIGH (ref <5.7)
Mean Plasma Glucose: 140 mg/dL — ABNORMAL HIGH (ref <117)

## 2011-04-10 MED ORDER — IOHEXOL 350 MG/ML SOLN
100.0000 mL | Freq: Once | INTRAVENOUS | Status: AC | PRN
  Administered 2011-04-10: 100 mL via INTRAVENOUS

## 2011-04-10 NOTE — Op Note (Signed)
NAME:  [name] [name], [name] [name] NO.:  [id number]  MEDICAL RECORD NO.:  [id number]           PATIENT TYPE:  I  LOCATION:  3310                         FACILITY:  [facility]  PHYSICIAN:  Di Kindle. 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  PROCEDURE:  Thrombectomy of fem-fem bypass graft and right femoral thrombectomy.  SURGEON:  Di Kindle. Edilia Bo, MD  ASSISTANT: 1. Durene Cal IV, MD and Jess Barters, RNFA  ANESTHESIA:  General.  INDICATIONS:  This is a 60 year old gentleman who underwent left common iliac artery PTA and stent and left common femoral artery endarterectomy with left-to-right fem-fem bypass graft by Dr. Arbie Cookey in March 2011.  He presented with a 2-3 days history of pain and paresthesias in the right lower extremity and rest pain in the right lower extremity, he was found to have an occluded graft.  Taken to the operating room for thrombectomy urgently.  TECHNIQUE:  The patient was taken to the operating room, received a general anesthetic.  Both groins and both lower extremities were prepped and draped in usual sterile fashion.  A longitudinal incision was made in the right groin and through dense scar tissue, the right limb of the fem-fem bypass graft was dissected free.  In addition, the common femoral artery proximal to the anastomosis was dissected free and controlled.  There were two deep femoral arteries, which were controlled and the superficial femoral artery was chronically occluded was controlled.  The patient was then heparinized.  A longitudinal graftotomy was made over the right femoral anastomosis and there was fair amount of  laminated thrombus, which was retrieved and then fresh thrombus.  Graft thrombectomy was achieved using a #5 Fogarty catheter. Laminated thrombus was retrieved from the left groin and good inflow was established to the graft.  The graft was flushed with heparinized saline and clamp.  Next, a #4 Fogarty was passed individually down each to the deep femoral artery branches and some clot was retrieved distally. There was good backbleeding established.  The graftotomy was then closed with running 5-0 Prolene suture.  At the completion, there was a popliteal signal with the Doppler.  I was unable to obtain a pedal signal with the Doppler.  The foot appeared adequately perfused, however.  I felt the problems which may have possibly caused graft thrombosis was either inflow problem on the right where the patient had a previous stent as the pulse was slightly diminished here versus an outflow problem given the known SFA occlusion, although this was chronic.  I felt the best option would be to evaluate the patient with CT angiogram postoperatively to further evaluate this.  At the completion, the heparin was not reversed with protamine.  The wound was closed with deep layer of 3-0 Vicryl.  The skin was closed with a 4-0 Vicryl.  Sterile dressing was applied.  The patient tolerated the procedure well, transferred  to the recovery room in stable condition.  All needle and sponge counts were correct.     Di Kindle. Edilia Bo, M.D.     CSD/MEDQ  D:  04/09/2011  T:  04/10/2011  Job:  [id number]  Electronically Signed by Waverly Ferrari M.D. on 04/10/2011 11:43:26 AM

## 2011-04-10 NOTE — H&P (Signed)
NAME:  [name] [name], [name] [name] NO.:  [id number]  MEDICAL RECORD NO.:  [id number]           PATIENT TYPE:  E  LOCATION:  [facility]                         FACILITY:  [facility]  PHYSICIAN:  Di Kindle. Edilia Bo, M.D.DATE OF BIRTH:  July 27, 1951  DATE OF ADMISSION:  04/09/2011 DATE OF DISCHARGE:                             HISTORY & PHYSICAL   REASON FOR ADMISSION:  Ischemic right foot.  HISTORY OF PRESENT ILLNESS:  This is a pleasant 60 year old gentleman, who originally presented in February 2011 with multilevel arterial occlusive disease.  He had an arteriogram in February 2011, which showed a tight left external iliac artery stenosis in addition to an occluded right external iliac artery and right common femoral artery.  He also had bilateral superficial femoral artery occlusions with reconstitution of above knee popliteal artery bilaterally.  He had two-vessel runoff via the peroneal and posterior tibial arteries bilaterally.  The patient was taken to the operating room by Dr. Arbie Cookey on February 05, 2010, and underwent PTA and stenting of the left common iliac artery and then a left common femoral artery endarterectomy with profundoplasty and a left-to-right fem-fem bypass graft with an 8-mm Dacron graft.  On their most recent followup visit in June 2011, he had a palpable graft pulse with an ABI of 62% on the right and 59% on the left.  The patient states that approximately 2-3 days ago he developed the fairly sudden onset of pain and numbness in both legs, but more significantly on the right foot.  He has also had rest pain in the right foot for 2-3 days. The pain is aggravated by elevation and relieved with dependency somewhat.  He has had persistent pain and numbness in the right leg.  PAST MEDICAL HISTORY:  Significant for non-insulin-dependent diabetes, which he has had for approximately 20 years.  In addition, he by records has a history of elevated cholesterol and  hypertension.  He denies any history of previous myocardial infarction, history of congestive heart failure, or history of COPD.  FAMILY HISTORY:  He states that his sister had a heart attack at a young age and he also has a brother who had heart transplant.  SOCIAL HISTORY:  He is single.  He has 2 sons.  He smokes a pack per day of cigarettes and has been smoking for 42 years.  MEDICATIONS: 1. Dilantin 1 p.o. b.i.d.  He did not know the dose. 2. Metformin twice a day.  He did not have the dose. 3. Glucotrol p.r.n.  REVIEW OF SYSTEMS:  GENERAL:  He has had no recent weight loss or weight gain or problems with his appetite.  CARDIOVASCULAR:  He has had no chest pain, chest pressure, palpitations, or arrhythmias.  He has had no history of stroke or TIAs.  He has had no history of DVT or phlebitis. PULMONARY:  He has had no productive cough, bronchitis, asthma, or wheezing.  GI, GU, neurologic, musculoskeletal, psychiatric, ENT, hematologic review of systems is unremarkable.  PHYSICAL EXAMINATION:  GENERAL:  This is a pleasant 60 year old gentleman, who appears his stated age. VITAL SIGNS:  His temperature is 98.6, blood pressure  137/93, heart rate is 72, respiratory rate is 18. HEENT:  Unremarkable. LUNGS:  Clear bilaterally to auscultation without rales, rhonchi, or wheezing. CARDIOVASCULAR:  I do not detect any carotid bruits.  He has regular rate and rhythm. EXTREMITIES:  He has a palpable left femoral pulse.  I cannot palpate a right femoral pulse.  I cannot palpate popliteal pedal pulses on either side.  I am unable to obtain Doppler flow in the right foot.  He does have a Doppler posterior tibial and dorsalis pedis signal on the left. The right foot is cooler than the left.  Motor function is currently intact.  RADIOLOGICAL STUDIES:  I have been able to interpret his duplex scan, which shows an occluded fem-fem bypass graft.  LABORATORY DATA:  I have also reviewed his  labs that show white blood cell count of 5, H and H 14 and 42, platelets 128,000.  Creatinine is 0.8, potassium is 4.1.  IMPRESSION:  This patient presents with fairly sudden onset of right lower extremity pain and paresthesias, which began 2 or 3 days ago.  He was found to have occluded fem-fem bypass graft on exam and he states that he has actually been able to feel the pulse in the past without difficulty until just yesterday.  It appears that he occluded his graft 2-3 days ago and I think it is best chance to restore pains is emergent thrombectomy.  I am somewhat concerned about the inflow given that he required iliac stenting on the left, but he does have a palpable femoral pulse on the left which appears to be reasonable.  If this is a concern, consideration could be given to arteriography of the brachial approach to reassess for any proximal inflow disease.  In addition, we will follow his blood sugars closely.     Di Kindle. Edilia Bo, M.D.     CSD/MEDQ  D:  04/09/2011  T:  04/09/2011  Job:  [id number]  Electronically Signed by Waverly Ferrari M.D. on 04/10/2011 11:43:20 AM

## 2011-04-11 LAB — GLUCOSE, CAPILLARY
Glucose-Capillary: 125 mg/dL — ABNORMAL HIGH (ref 70–99)
Glucose-Capillary: 123 mg/dL — ABNORMAL HIGH (ref 70–99)

## 2011-04-11 LAB — CBC
HCT: 33.5 % — ABNORMAL LOW (ref 39.0–52.0)
Hemoglobin: 11.4 g/dL — ABNORMAL LOW (ref 13.0–17.0)
RBC: 3.67 MIL/uL — ABNORMAL LOW (ref 4.22–5.81)
WBC: 7 10*3/uL (ref 4.0–10.5)

## 2011-04-12 LAB — HEPARIN LEVEL (UNFRACTIONATED): Heparin Unfractionated: 0.23 IU/mL — ABNORMAL LOW (ref 0.30–0.70)

## 2011-04-12 LAB — CBC
HCT: 34.7 % — ABNORMAL LOW (ref 39.0–52.0)
Hemoglobin: 11.6 g/dL — ABNORMAL LOW (ref 13.0–17.0)
WBC: 6.6 10*3/uL (ref 4.0–10.5)

## 2011-04-12 LAB — GLUCOSE, CAPILLARY: Glucose-Capillary: 145 mg/dL — ABNORMAL HIGH (ref 70–99)
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
## 2011-04-23 ENCOUNTER — Ambulatory Visit (INDEPENDENT_AMBULATORY_CARE_PROVIDER_SITE_OTHER): Payer: Self-pay | Admitting: Vascular Surgery

## 2011-04-23 DIAGNOSIS — I7092 Chronic total occlusion of artery of the extremities: Secondary | ICD-10-CM

## 2011-04-23 NOTE — Assessment & Plan Note (Signed)
OFFICE VISIT  BRADEY, LUZIER DOB:  [birth year]                                       04/23/2011 OZHYQ#:[id number]  Patient presents today for follow-up of his recent urgent thrombectomy of a left to right fem-fem bypass.  He is well-known to me from prior treatment for severe lower extremity arterial insufficiency.  At that time, he had evaluation in February 2011 with Dr. Darrick Penna, where he underwent left external iliac angioplasty and subsequently underwent a left to right fem-fem bypass on 02/05/10.  He did well but presented with a several-day history of severe ischemia in his right lower extremity with an occluded fem-fem bypass.  He underwent thrombectomy of this by Dr. Cari Caraway on 04/09/11.  He underwent postop CT angiogram for further evaluation, and this has shown development of a severe stenosis in the left common iliac artery just distal to the bifurcation. He does have a well-healed right groin incision and has a normal pulse in his fem-fem bypass.  I discussed the options with patient.  I feel that his graft is still at risk since he has severe high-grade left common iliac artery stenosis. I explained the option of reintervention and angioplasty of this focal lesion versus aortobifemoral bypass.  I explained that the aortobifemoral bypass would have a predicted longer patency and better durability but certainly would be a much greater magnitude than angioplasty of his current stenosis.  He wishes to proceed with reevaluation and potential intervention of this stenosis.  He has been scheduled for outpatient arteriogram and probable left common iliac artery angioplasty with Dr. Cari Caraway on June 11 as an outpatient.  He does request medication for pain and was given Tylox #30, no refills, for his persistent moderate left foot pain.  He will undergo further evaluation with Dr. Edilia Bo on 06/11.    Larina Earthly, M.D. Electronically  Signed  TFE/MEDQ  D:  04/23/2011  T:  04/23/2011  Job:  [id number]  cc:   Di Kindle. Edilia Bo, M.D.

## 2011-05-02 NOTE — Discharge Summary (Signed)
[name] [name], [name] [name]              ACCOUNT NO.:  [id number]  MEDICAL RECORD NO.:  [id number]  LOCATION:  2033                         FACILITY:  [facility]  PHYSICIAN:  Di Kindle. Edilia Bo, M.D.DATE OF BIRTH:  Mar 03, 1951  DATE OF ADMISSION:  04/09/2011 DATE OF DISCHARGE:  04/12/2011                              DISCHARGE SUMMARY   ADMISSION DIAGNOSIS:  Ischemic, right foot.  HISTORY OF PRESENT ILLNESS:  This is a 60 year old male who originally presented in February 2011 with multilevel arterial occlusive disease. He had an arteriogram in February 2011 which revealed a tight left external iliac artery stenosis in addition to an occluded right external iliac artery and right common femoral artery.  He also had bilateral superficial femoral artery occlusions with reconstitution of above-knee popliteal artery bilaterally.  She had two-vessel runoff via the peroneal and posterior tibial arteries bilaterally.  The patient was taken to the operating room by Dr. Arbie Cookey on February 05, 2010, and underwent PTA and stenting of the left common iliac artery and then a left common femoral artery endarterectomy with profundoplasty and a left to right fem-fem bypass graft with an 8-mm Dacron graft.  On the most recent followup visit in June 2011, he had a palpable graft pulse with an ABI of 62% on the right, 59% on the left.  The patient states that approximately 2-3 days before admission he developed the fairly sudden onset of pain and numbness in both legs but more significantly on the right foot.  He also had rest pain in the right foot for 2-3 days.  Pain is aggravated by elevation and relieved with dependent be somewhat.  He has had persistent pain and numbness in the right leg.  HOSPITAL COURSE:  The patient is admitted to the hospital and taken to the operating room on Apr 09, 2011, where he underwent a thrombectomy of his fem-fem bypass graft and right femoral thrombectomy.  He tolerated the  procedure well and was transported to the recovery room in satisfactory condition.  By postoperative day #1, he had brisk PT signal bilaterally and palpable fem-fem pulse.  CT angiogram was ordered to evaluate inflow on the left and outflow.  Tobacco cessation consult was obtained.  The CTA revealed aortoiliac occlusive disease with recurrent left iliac stenosis which compromised outflow on the right with disease, PSA, an occluded bilateral SFA.  There were some scattered clots.  He was then put on heparin while he was in the hospital.  His graft was at risk for reocclusion, but Dr. Edilia Bo was reluctant to start him on Coumadin.  Dr. Edilia Bo believes that his best option is elective aortofemoral bypass grafting and right fem-pop.  The patient was discharged home on postoperative day #3 and will follow with Dr. Arbie Cookey in 1-2 weeks.  Otherwise, his postoperative course included increasing ambulation as well as increasing intake of solids without difficulty.  DISCHARGE INSTRUCTIONS:  He is discharged home with extensive instructions on wound care and progressive ambulation.  He is instructed not to drive or perform any heavy lifting until returning to see Dr. Arbie Cookey.  DISCHARGE DIAGNOSES: 1. Ischemic right lower extremity with occluded femoral-femoral bypass     graft.  a.     Status post thrombectomy of femoral-femoral bypass graft and      right femoral thrombectomy on Apr 09, 2011. 2. 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     a.     He will need aortobifemoral bypass graft in the future with      Dr. Arbie Cookey. 3. Insulin dependent diabetes which she has had for approximately 20     years. 4. Hypercholesterolemia. 5. Hypertension.  DISCHARGE MEDICATIONS: 1. Oxycodone 5 mg one to two p.o. q.4 hours p.r.n. pain. 2. CoQ10 1 tablet b.i.d. 3. Dilantin extended release 100 mg 2-3 tablets b.i.d. 4. Fish oil 1000 mg 1 capsule t.i.d. 5. Glipizide 10 mg p.o.  b.i.d. p.r.n. 6. Metformin 500 mg p.o. b.i.d.  FOLLOWUP:  The patient is to follow up with Dr. Arbie Cookey in 1-2 weeks.    ______________________________ Newton Pigg, PA   ______________________________ Di Kindle. Edilia Bo, M.D.    SE/MEDQ  D:  04/29/2011  T:  04/30/2011  Job:  [id number]  Electronically Signed by Newton Pigg PA on 04/30/2011 10:11:10 AM Electronically Signed by Waverly Ferrari M.D. on 05/02/2011 03:14:20 PM

## 2011-05-06 ENCOUNTER — Ambulatory Visit (HOSPITAL_COMMUNITY)
Admission: RE | Admit: 2011-05-06 | Discharge: 2011-05-07 | Disposition: A | Discharge status: Home / Self Care | Payer: Medicaid Other | Source: Ambulatory Visit | Attending: Vascular Surgery | Admitting: Vascular Surgery

## 2011-05-06 DIAGNOSIS — I70219 Atherosclerosis of native arteries of extremities with intermittent claudication, unspecified extremity: Secondary | ICD-10-CM | POA: Insufficient documentation

## 2011-05-06 DIAGNOSIS — I745 Embolism and thrombosis of iliac artery: Secondary | ICD-10-CM | POA: Insufficient documentation

## 2011-05-06 HISTORY — PX: ILIAC ARTERY STENT: SHX1786

## 2011-05-06 LAB — POCT I-STAT, CHEM 8
BUN: 12 mg/dL (ref 6–23)
Chloride: 106 mEq/L (ref 96–112)
HCT: 43 % (ref 39.0–52.0)
Sodium: 139 mEq/L (ref 135–145)
TCO2: 25 mmol/L (ref 0–100)

## 2011-05-06 LAB — GLUCOSE, CAPILLARY
Glucose-Capillary: 120 mg/dL — ABNORMAL HIGH (ref 70–99)
Glucose-Capillary: 130 mg/dL — ABNORMAL HIGH (ref 70–99)
Glucose-Capillary: 87 mg/dL (ref 70–99)

## 2011-05-06 LAB — POCT ACTIVATED CLOTTING TIME: Activated Clotting Time: 205 seconds

## 2011-05-07 LAB — BASIC METABOLIC PANEL
CO2: 24 mEq/L (ref 19–32)
Calcium: 9.2 mg/dL (ref 8.4–10.5)
Glucose, Bld: 141 mg/dL — ABNORMAL HIGH (ref 70–99)
Sodium: 138 mEq/L (ref 135–145)

## 2011-05-07 LAB — CBC
Hemoglobin: 13.7 g/dL (ref 13.0–17.0)
MCH: 31 pg (ref 26.0–34.0)
RBC: 4.42 MIL/uL (ref 4.22–5.81)

## 2011-05-07 LAB — HEMOGLOBIN A1C: Mean Plasma Glucose: 137 mg/dL — ABNORMAL HIGH (ref <117)

## 2011-05-07 NOTE — Op Note (Signed)
[name] [name], [name] [name]              ACCOUNT NO.:  [id number]  MEDICAL RECORD NO.:  [id number]  LOCATION:  2027                         FACILITY:  [facility]  PHYSICIAN:  Di Kindle. Edilia Bo, M.D.DATE OF BIRTH:  March 01, 1951  DATE OF PROCEDURE:  05/06/2011 DATE OF DISCHARGE:                              OPERATIVE REPORT   PREOPERATIVE DIAGNOSIS:  Bilateral lower extremity claudication with left iliac artery occlusive disease.  POSTOPERATIVE DIAGNOSIS:  Bilateral lower extremity claudication with left iliac artery occlusive disease.  PROCEDURE: 1. Ultrasound-guided access to the left brachial artery. 2. Aortogram. 3. Placement of left external iliac artery stent (EV3 8 x 40-mm,     postdilatation with 7-mm x 3-cm balloon). 4. Placement of left common iliac artery stent with an EV3 12 mm x 40     mm-stent and postdilatation with an 8-mm x 30-cm balloon.  INDICATIONS:  This is a 60 year old gentleman who had undergone previous left external iliac artery stent and a left-to-right fem-fem bypass graft.  He had presented with a clotted fem-fem graft and underwent thrombectomy of his graft.  Postoperatively, he had a CT angio which showed some diffuse plaque within the aorta and narrowing within the proximal left common iliac artery and bilateral superficial femoral artery occlusions.  Given the risk for graft thrombosis because of inflow and outflow problems, consideration was given to aortofemoral bypass grafting, but after discussion with the patient, Dr. Arbie Cookey and the patient wished to attempt dressing the inflow disease and iliac disease before proceeding with aortofemoral bypass grafting.  The procedure and potential complications were discussed with the patient preoperatively.  All of his questions were answered and he was agreeable to proceed.  TECHNIQUE:  He was taken to the PV lab and sedated with a milligram of Versed and 50 mcg of fentanyl.  During the procedure, he received  an additional milligram of Versed and 50 mcg of fentanyl.  The left arm was prepped and draped in usual sterile fashion.  Under ultrasound guidance after the skin was anesthetized, the left brachial artery was cannulated with a micropuncture set and guidewire introduced into the brachial artery.  Micropuncture sheath was placed and then a Wholey wire was placed through micropuncture sheath and this sheath exchanged for a short 5-French sheath.  Nitroglycerin 200 mcg and 2000 mg of heparin were given through the sheath.  The wire was then advanced into the aortic arch and then using the pigtail catheter, the wire was directed down into the descending thoracic aorta and positioned at the L1 vertebral body.  The catheter was positioned here and flush aortogram was obtained.  I then advanced the catheter to the bifurcation and oblique iliac projections were obtained.  The stenosis noted on the CT scan in the proximal left common iliac artery was somewhat difficult to visualize adequately.  I did therefore elect to advance the wire down the left iliac system beyond the stent and beyond the area of mild narrowing in the external iliac artery distal to the stent.  A 135-cm Quick-Cross catheter was then advanced over the wire and positioned in the distal external iliac artery.  A pressure gradient was then measured.  There is only a mild  gradient across the external iliac artery, area of mild narrowing and stenting, but a 35-mm resting gradient across the proximal left common iliac artery.  I therefore elected to stent these areas.  The sheath was then exchanged for a long 6-French sheath which was positioned at the distal aorta.  A Glow and Tell marker was placed.  The wire was re-advanced at the iliac system on the left.  I elected to address the distal stenosis first.  The EV3 8-mm x 40 mm self-expanding stent was positioned across the area of concern with slight overlap into the previously  placed stent.  This was deployed.  Post dilatation was then performed with a 7-mm x 30-mm balloon inflating to 8 atmospheres.  Post completion arteriogram showed an excellent result.  Next, to address the common iliac artery stenosis, an EV3 12-mm x 40 mm self-expanding stent was positioned across the area of stenosis right at the proximal common iliac artery extending into the aorta.  The sheath was retracted and the stent deployed and then postdilatation was done with an 8-mm x 30-mm balloon again with an excellent result.  Bilateral extremity runoff film was then obtained.  FINDINGS: 1. Mild diffuse disease in the infrarenal aorta. 2. Patent renal arteries bilaterally. 3. Proximal left common iliac artery stenosis which was successfully     stented and ballooned as described above. 4. Mild stenosis below the previously placed stent in the left     external iliac artery which was successfully ballooned and stented     as described above. 5. Bilateral superficial femoral artery occlusions with reconstitution     of the above-knee popliteal artery bilaterally.  Anterior tibial     arteries are occluded bilaterally.  Peroneal and posterior tibial     arteries are patent bilaterally.  On the left side, posterior     tibial artery occludes in the mid leg and then there is     reconstitution distally of the posterior tibial artery.     Di Kindle. Edilia Bo, M.D.     CSD/MEDQ  D:  05/06/2011  T:  05/07/2011  Job:  [id number]  cc:   Larina Earthly, M.D.  Electronically Signed by Waverly Ferrari M.D. on 05/07/2011 07:08:05 PM

## 2011-05-28 ENCOUNTER — Ambulatory Visit: Payer: Self-pay | Admitting: Vascular Surgery

## 2011-10-01 ENCOUNTER — Encounter: Payer: Self-pay | Admitting: Vascular Surgery

## 2011-10-08 ENCOUNTER — Encounter: Payer: Self-pay | Admitting: Vascular Surgery

## 2011-10-09 ENCOUNTER — Ambulatory Visit (INDEPENDENT_AMBULATORY_CARE_PROVIDER_SITE_OTHER): Payer: Medicaid Other | Admitting: Vascular Surgery

## 2011-10-09 ENCOUNTER — Encounter: Payer: Self-pay | Admitting: Vascular Surgery

## 2011-10-09 ENCOUNTER — Ambulatory Visit (INDEPENDENT_AMBULATORY_CARE_PROVIDER_SITE_OTHER): Payer: Medicaid Other

## 2011-10-09 VITALS — BP 150/95 | HR 59 | Resp 18 | Ht 75.0 in | Wt 165.0 lb

## 2011-10-09 DIAGNOSIS — I739 Peripheral vascular disease, unspecified: Secondary | ICD-10-CM

## 2011-10-09 DIAGNOSIS — Z48812 Encounter for surgical aftercare following surgery on the circulatory system: Secondary | ICD-10-CM

## 2011-10-09 DIAGNOSIS — I70219 Atherosclerosis of native arteries of extremities with intermittent claudication, unspecified extremity: Secondary | ICD-10-CM

## 2011-10-09 NOTE — Progress Notes (Signed)
[facility] [place]  Patient name: [name] [name] MRN: [id number] DOB: [birth year] Sex: male  CC: followup of her peripheral vascular disease.  HPI: EVANGELOS PAULINO is a 60 y.o. male who was originally seen by Dr. Arbie Cookey in February of 2011. He had severe multilevel arterial occlusive disease. 02/05/2010 Dr. Arbie Cookey performed a left common iliac artery PTA and stenting with a left common femoral artery endarterectomy and left to right fem-fem bypass graft. Thrombectomy of his fem-fem bypass graft on 04/09/2011. 05/06/2011 I stented a left common iliac artery and left external iliac artery a stenosis. He comes in for a followup visit.  I do not get any clear-cut history of claudication, rest pain, or nonhealing ulcers. He doesn't describe some paresthesias in his right foot which sounds like neuropathy. As a history of hypertension and hyperlipidemia which are stable on his current medications. In addition he states his blood sugars have been well controlled.  Past Medical History  Diagnosis Date  . Diabetes mellitus   . Hyperlipidemia   . Hypertension   . Depression with anxiety   . Arthritis   . Leg pain   . Seizure disorder     Family History  Problem Relation Age of Onset  . Hypertension Mother   . Heart attack Mother   . Diabetes Mother   . Aneurysm Father   . Heart attack Sister   . Heart disease Brother     heart transplant    SOCIAL HISTORY: History  Substance Use Topics  . Smoking status: Current Everyday Smoker -- 1.0 packs/day for 42 years    Types: Cigarettes  . Smokeless tobacco: Not on file  . Alcohol Use: No     alcohol abuse in the past    Allergies  Allergen Reactions  . Penicillins     Current Outpatient Prescriptions  Medication Sig Dispense Refill  . aspirin 81 MG tablet Take 81 mg by mouth daily.        Marland Kitchen glipiZIDE (GLUCOTROL) 10 MG tablet Take 10 mg by mouth 2 (two) times daily before a meal.        . METFORMIN HCL PO Take  by mouth.        . phenytoin (DILANTIN) 100 MG ER capsule Take 300 mg by mouth 2 (two) times daily. 200 mg in the morning. 300 mg in the evening.       . Turmeric, Curcuma Longa, POWD          REVIEW OF SYSTEMS: Arly.Keller ] denotes positive finding; [  ] denotes negative finding CARDIOVASCULAR:  [ ]  chest pain   [ ]  chest pressure   [ ]  palpitations   [ ]  orthopnea   [ ]  dyspnea on exertion   Arly.Keller ] claudication   [ ]  rest pain   [ ]  DVT   [ ]  phlebitis PULMONARY:   Arly.Keller ] productive cough   [ ]  asthma   [ ]  wheezing NEUROLOGIC:   [ ]  weakness  [ ]  paresthesias  [ ]  aphasia  [ ]  amaurosis  [ ]  dizziness HEMATOLOGIC:   [ ]  bleeding problems   [ ]  clotting disorders MUSCULOSKELETAL:  [ ]  joint pain   [ ]  joint swelling [ ]  leg swelling GASTROINTESTINAL: [ ]   blood in stool  [ ]   hematemesis GENITOURINARY:  [ ]   dysuria  [ ]   hematuria PSYCHIATRIC:  [ ]  history of major depression INTEGUMENTARY:  [ ]  rashes  [ ]  ulcers  CONSTITUTIONAL:  [ ]  fever   [ ]  chills  PHYSICAL EXAM: Filed Vitals:   10/09/11 1301  BP: 150/95  Pulse: 59  Resp: 18  Height: 6\' 3"  (1.905 m)  Weight: 165 lb (74.844 kg)  SpO2: 96%   Body mass index is 20.62 kg/(m^2). GENERAL: The patient is a well-nourished male, in no acute distress. The vital signs are documented above. CARDIOVASCULAR: There is a regular rate and rhythm without significant murmur appreciated. I do not detect any carotid bruits. He has palpable femoral pulses are palpable graft pulse. I cannot palpate pedal pulses. Both feet are warm and well-perfused. PULMONARY: There is good air exchange bilaterally without wheezing or rales. ABDOMEN: Soft and non-tender with normal pitched bowel sounds.  MUSCULOSKELETAL: There are no major deformities or cyanosis. NEUROLOGIC: No focal weakness or paresthesias are detected. SKIN: There are no ulcers or rashes noted. PSYCHIATRIC: The patient has a normal affect.  DATA:  I independently interpreted his arterial Doppler  study today which shows monophasic Doppler signals in both feet with an ABI of 60% on the right and 72% on the left. The ABI on the right is stable in the ABI on the left is improved from and I will after his problems with his occluded graft.  MEDICAL ISSUES: 1. We have had a long discussion about the importance of tobacco cessation. Currently is not ready to consider tobacco cessation and therefore did not want referral to tobacco cessation clinic. 2. I have encouraged him to stay as active as possible. I have ordered followup ABIs in 6 months and will have him see Dr. Arbie Cookey at that time. He knows to call sooner if he has problems.  DICKSON,CHRISTOPHER S [facility] [place] Office: [phone]

## 2011-11-10 ENCOUNTER — Encounter (HOSPITAL_COMMUNITY): Payer: Self-pay | Admitting: Emergency Medicine

## 2011-11-10 ENCOUNTER — Inpatient Hospital Stay (HOSPITAL_COMMUNITY)
Admission: EM | Admit: 2011-11-10 | Discharge: 2011-11-13 | DRG: 300 | Disposition: A | Discharge status: Home / Self Care | Payer: Medicaid Other | Attending: Vascular Surgery | Admitting: Vascular Surgery

## 2011-11-10 DIAGNOSIS — M129 Arthropathy, unspecified: Secondary | ICD-10-CM | POA: Diagnosis present

## 2011-11-10 DIAGNOSIS — Z7982 Long term (current) use of aspirin: Secondary | ICD-10-CM

## 2011-11-10 DIAGNOSIS — I700 Atherosclerosis of aorta: Secondary | ICD-10-CM | POA: Diagnosis present

## 2011-11-10 DIAGNOSIS — I70309 Unspecified atherosclerosis of unspecified type of bypass graft(s) of the extremities, unspecified extremity: Secondary | ICD-10-CM | POA: Diagnosis present

## 2011-11-10 DIAGNOSIS — G40802 Other epilepsy, not intractable, without status epilepticus: Secondary | ICD-10-CM | POA: Diagnosis present

## 2011-11-10 DIAGNOSIS — E785 Hyperlipidemia, unspecified: Secondary | ICD-10-CM | POA: Diagnosis present

## 2011-11-10 DIAGNOSIS — I70219 Atherosclerosis of native arteries of extremities with intermittent claudication, unspecified extremity: Principal | ICD-10-CM | POA: Diagnosis present

## 2011-11-10 DIAGNOSIS — E119 Type 2 diabetes mellitus without complications: Secondary | ICD-10-CM | POA: Diagnosis present

## 2011-11-10 DIAGNOSIS — I2510 Atherosclerotic heart disease of native coronary artery without angina pectoris: Secondary | ICD-10-CM | POA: Diagnosis present

## 2011-11-10 DIAGNOSIS — I739 Peripheral vascular disease, unspecified: Secondary | ICD-10-CM

## 2011-11-10 DIAGNOSIS — M79609 Pain in unspecified limb: Secondary | ICD-10-CM

## 2011-11-10 DIAGNOSIS — F341 Dysthymic disorder: Secondary | ICD-10-CM | POA: Diagnosis present

## 2011-11-10 DIAGNOSIS — F17200 Nicotine dependence, unspecified, uncomplicated: Secondary | ICD-10-CM | POA: Diagnosis present

## 2011-11-10 DIAGNOSIS — I708 Atherosclerosis of other arteries: Secondary | ICD-10-CM | POA: Diagnosis present

## 2011-11-10 DIAGNOSIS — I10 Essential (primary) hypertension: Secondary | ICD-10-CM | POA: Diagnosis present

## 2011-11-10 LAB — BASIC METABOLIC PANEL
CO2: 29 mEq/L (ref 19–32)
Chloride: 97 mEq/L (ref 96–112)
Creatinine, Ser: 0.78 mg/dL (ref 0.50–1.35)
GFR calc Af Amer: >90 mL/min (ref >90)
Potassium: 3.8 mEq/L (ref 3.5–5.1)
Sodium: 135 mEq/L (ref 135–145)

## 2011-11-10 LAB — DIFFERENTIAL
Basophils Absolute: 0.1 10*3/uL (ref 0.0–0.1)
Basophils Relative: 1 % (ref 0–1)
Lymphocytes Relative: 27 % (ref 12–46)
Monocytes Absolute: 0.5 10*3/uL (ref 0.1–1.0)
Neutro Abs: 5 10*3/uL (ref 1.7–7.7)
Neutrophils Relative %: 63 % (ref 43–77)

## 2011-11-10 LAB — PROTIME-INR
INR: 1 (ref 0.00–1.49)
Prothrombin Time: 13.4 seconds (ref 11.6–15.2)

## 2011-11-10 LAB — GLUCOSE, CAPILLARY

## 2011-11-10 LAB — CBC
HCT: 41.6 % (ref 39.0–52.0)
RDW: 13.7 % (ref 11.5–15.5)
WBC: 7.9 10*3/uL (ref 4.0–10.5)

## 2011-11-10 LAB — TYPE AND SCREEN: ABO/RH(D): A NEG

## 2011-11-10 MED ORDER — OXYCODONE-ACETAMINOPHEN 5-325 MG PO TABS
1.0000 | ORAL_TABLET | ORAL | Status: DC | PRN
  Administered 2011-11-11 – 2011-11-13 (×11): 2 via ORAL
  Filled 2011-11-10 (×11): qty 2

## 2011-11-10 MED ORDER — ALUM & MAG HYDROXIDE-SIMETH 200-200-20 MG/5ML PO SUSP: 15.0000 mL | ORAL | Status: DC | PRN

## 2011-11-10 MED ORDER — HEPARIN BOLUS VIA INFUSION
4000.0000 [IU] | Freq: Once | INTRAVENOUS | Status: AC
  Administered 2011-11-11: 4000 [IU] via INTRAVENOUS
  Filled 2011-11-10: qty 4000

## 2011-11-10 MED ORDER — ACETAMINOPHEN 650 MG RE SUPP
325.0000 mg | RECTAL | Status: DC | PRN
  Filled 2011-11-10: qty 2

## 2011-11-10 MED ORDER — INSULIN ASPART 100 UNIT/ML ~~LOC~~ SOLN: 0.0000 [IU] | Freq: Every day | SUBCUTANEOUS | Status: DC

## 2011-11-10 MED ORDER — HYDROMORPHONE HCL PF 1 MG/ML IJ SOLN
0.5000 mg | INTRAMUSCULAR | Status: DC | PRN
  Administered 2011-11-11 – 2011-11-13 (×16): 1 mg via INTRAVENOUS
  Filled 2011-11-10 (×16): qty 1

## 2011-11-10 MED ORDER — PHENOL 1.4 % MT LIQD: 1.0000 | OROMUCOSAL | Status: DC | PRN

## 2011-11-10 MED ORDER — LABETALOL HCL 5 MG/ML IV SOLN
10.0000 mg | INTRAVENOUS | Status: DC | PRN
  Filled 2011-11-10: qty 4

## 2011-11-10 MED ORDER — HEPARIN SOD (PORCINE) IN D5W 100 UNIT/ML IV SOLN
1200.0000 [IU]/h | INTRAVENOUS | Status: DC
  Administered 2011-11-11: 1200 [IU]/h via INTRAVENOUS
  Filled 2011-11-10 (×2): qty 250

## 2011-11-10 MED ORDER — FAMOTIDINE IN NACL 20-0.9 MG/50ML-% IV SOLN
20.0000 mg | Freq: Two times a day (BID) | INTRAVENOUS | Status: DC
  Administered 2011-11-11 (×2): 20 mg via INTRAVENOUS
  Filled 2011-11-10 (×5): qty 50

## 2011-11-10 MED ORDER — SODIUM CHLORIDE 0.9 % IV SOLN
INTRAVENOUS | Status: DC
  Administered 2011-11-10: via INTRAVENOUS

## 2011-11-10 MED ORDER — HYDROMORPHONE HCL PF 1 MG/ML IJ SOLN
INTRAMUSCULAR | Status: AC
  Administered 2011-11-11: 1 mg via INTRAVENOUS
  Filled 2011-11-10: qty 1

## 2011-11-10 MED ORDER — HYDRALAZINE HCL 20 MG/ML IJ SOLN
10.0000 mg | INTRAMUSCULAR | Status: DC | PRN
  Filled 2011-11-10: qty 0.5

## 2011-11-10 MED ORDER — GUAIFENESIN-DM 100-10 MG/5ML PO SYRP: 15.0000 mL | ORAL_SOLUTION | ORAL | Status: DC | PRN

## 2011-11-10 MED ORDER — DOCUSATE SODIUM 100 MG PO CAPS
100.0000 mg | ORAL_CAPSULE | Freq: Two times a day (BID) | ORAL | Status: DC
  Administered 2011-11-11 – 2011-11-13 (×5): 100 mg via ORAL
  Filled 2011-11-10 (×6): qty 1

## 2011-11-10 MED ORDER — ASPIRIN EC 81 MG PO TBEC
81.0000 mg | DELAYED_RELEASE_TABLET | Freq: Every day | ORAL | Status: DC
  Administered 2011-11-11 – 2011-11-13 (×3): 81 mg via ORAL
  Filled 2011-11-10 (×3): qty 1

## 2011-11-10 MED ORDER — ACETAMINOPHEN 325 MG PO TABS: 325.0000 mg | ORAL_TABLET | ORAL | Status: DC | PRN

## 2011-11-10 MED ORDER — METOPROLOL TARTRATE 1 MG/ML IV SOLN: 2.0000 mg | INTRAVENOUS | Status: DC | PRN

## 2011-11-10 MED ORDER — ONDANSETRON HCL 4 MG/2ML IJ SOLN: 4.0000 mg | Freq: Four times a day (QID) | INTRAMUSCULAR | Status: DC | PRN

## 2011-11-10 MED ORDER — INSULIN ASPART 100 UNIT/ML ~~LOC~~ SOLN
0.0000 [IU] | Freq: Three times a day (TID) | SUBCUTANEOUS | Status: DC
  Administered 2011-11-12: 3 [IU] via SUBCUTANEOUS
  Administered 2011-11-12: 2 [IU] via SUBCUTANEOUS
  Filled 2011-11-10: qty 3

## 2011-11-10 MED ORDER — PHENYTOIN SODIUM EXTENDED 100 MG PO CAPS
300.0000 mg | ORAL_CAPSULE | Freq: Two times a day (BID) | ORAL | Status: DC
  Filled 2011-11-10: qty 3

## 2011-11-10 NOTE — ED Notes (Signed)
Dr Imogene Burn called RE cbg of 53.  He stated just to give pt orange juice.  Pt ao x 4.

## 2011-11-10 NOTE — ED Notes (Signed)
Paged Dr. Imogene Burn @ [phone]

## 2011-11-10 NOTE — ED Notes (Signed)
I gave the patient a happy meal and two cups of orange juice.

## 2011-11-10 NOTE — ED Notes (Signed)
PT. REPORTS RIGHT LOWER LEG PAIN FOR 2 DAYS , DENIES INJURY OR FALL ,  SPOKE WITH DR,. CHEN - ADVISED TO GO TO ER FOR EVALUATION , STATES HE HAD FEMORAL BY-PASS SURGERY 2 YEARS AGO ,  FAINT PEDAL PULSE / COLD.

## 2011-11-10 NOTE — H&P (Signed)
[facility] [place]  Admission History and Physical  History of Present Illness  [name] [name] is a 60 y.o. male who presents with chief complaint: right calf cramping and pain with very short distance ambulation.  The patient previous has undergone endovascular interventions on left iliac arterial system and left-to-right fem-fem bypass.  The fem-fem bypass required thrombecomy on 04/09/11.  Pain is described as cramping, severity 5-10/10, and associated with ambulation.  Patient notes this pain is similar to the episode of thrombosis.  Additionally, he notes the fem-fem pulse has been worsening over the last month.  Patient has attempted to treat this pain with rest.  The patient has intermittent rest pain symptoms also and no leg wounds/ulcers.  Atherosclerotic risk factors include: DM, hyperlipidemia, HTN, smoking.  Of note, baseline this patient has paraesthsias and anesthesia in the right foot.  He was able to ambulated into the hospital.  Past Medical History  Diagnosis Date  . Diabetes mellitus   . Hyperlipidemia   . Hypertension   . Depression with anxiety   . Arthritis   . Leg pain   . Seizure disorder     Past Surgical History  Procedure Date  . Thrombectomy     left to right fem-fem bypass  . Hernia repair   . Aortogram with iliac artery stenting 05/06/2011  02/05/10 PROCEDURE: 1. Left common iliac artery balloon angioplasty and stenting with an 8-     mm x 24-mm stent over an 48- x 3 balloon. 2. Left common femoral artery endarterectomy and profundus     endarterectomy. 3. Left-to-right fem-fem bypass with 8-mm Hemashield graft.  History   Social History  . Marital Status: Single    Spouse Name: N/A    Number of Children: N/A  . Years of Education: N/A   Occupational History  . Not on file.   Social History Main Topics  . Smoking status: Current Everyday Smoker -- 1.0 packs/day for 42 years    Types: Cigarettes  . Smokeless tobacco:  Not on file  . Alcohol Use: No     alcohol abuse in the past  . Drug Use: No  . Sexually Active:    Other Topics Concern  . Not on file   Social History Narrative  . No narrative on file    Family History  Problem Relation Age of Onset  . Hypertension Mother   . Heart attack Mother   . Diabetes Mother   . Aneurysm Father   . Heart attack Sister   . Heart disease Brother     heart transplant    No current facility-administered medications on file prior to encounter.   Current Outpatient Prescriptions on File Prior to Encounter  Medication Sig Dispense Refill  . aspirin 81 MG tablet Take 81 mg by mouth daily.        Marland Kitchen glipiZIDE (GLUCOTROL) 10 MG tablet Take 10 mg by mouth 2 (two) times daily before a meal.        . METFORMIN HCL PO Take 500 mg by mouth 2 (two) times daily.       . phenytoin (DILANTIN) 100 MG ER capsule Take 300 mg by mouth 2 (two) times daily. 200 mg in the morning. 300 mg in the evening.       . Turmeric, Curcuma Longa, POWD Take 1 tablet by mouth daily.         Allergies  Allergen Reactions  . Penicillins Swelling  Review of Systems (Positive items checked otherwise negative)  General: [ ]  Weight loss, [ ]  Weight gain, [ ]   Loss of appetite, [ ]  Fever  Neurologic: [ ]  Dizziness, [ ]  Blackouts, [ ]  Headaches, [x]  Seizure, [x]  Left foot numbness/tingling  Ear/Nose/Throat: [ ]  Change in eyesight, [ ]  Change in hearing, [ ]  Nose bleeds, [ ]  Sore throat  Vascular: [x]  Pain in legs with walking, [ ]  Pain in feet while lying flat, [ ]  Non-healing ulcer, Stroke, [ ]  "Mini stroke", [ ]  Slurred speech, [ ]  Temporary blindness, [ ]  Blood clot in vein, [ ]  Phlebitis  Pulmonary: [ ]  Home oxygen, [ ]  Productive cough, [ ]  Bronchitis, [ ]  Coughing up blood, [ ]  Asthma, [ ]  Wheezing  Musculoskeletal: [ ]  Arthritis, [ ]  Joint pain, [ ]  Muscle pain  Cardiac: [ ]  Chest pain, [ ]  Chest tightness/pressure, [ ]  Shortness of breath when lying flat, [ ]  Shortness of  breath with exertion, [ ]  Palpitations, [ ]  Heart murmur, [ ]  Arrythmia, [ ]  Atrial fibrillation  Hematologic: [ ]  Bleeding problems, [ ]  Clotting disorder, [ ]  Anemia  Psychiatric:  [ ]  Depression, [ ]  Anxiety, [ ]  Attention deficit disorder  Gastrointestinal:  [ ]  Black stool,[ ]   Blood in stool, [ ]  Peptic ulcer disease, [ ]  Reflux,[ ]  Hiatal hernia, [ ]  Trouble swallowing, [ ]  Diarrhea, [ ]  Constipation  Urinary:  [ ]  Kidney disease, [ ]  Burning with urination, [ ]  Frequent urination, [ ]  Difficulty urinating  Skin: [ ]  Ulcers, [ ]  Rashes   Physical Examination  Filed Vitals:   11/10/11 2125  BP: 140/84  Pulse: 79  Temp: 98 F (36.7 C)  TempSrc: Oral  SpO2: 95%   There is no height or weight on file to calculate BMI.  General: A&O x 3, WDWN  Head: [place]/AT  Ear/Nose/Throat: Hearing grossly intact, nares w/o erythema or drainage, oropharynx w/o Erythema/Exudate  Eyes: PERRLA, EOMI  Neck: Supple, no nuchal rigidity, no palpable LAD  Pulmonary: Sym exp, good air movt, CTAB, no rales, rhonchi, & wheezing  Cardiac: RRR, Nl S1, S2, no Murmurs, rubs or gallops  Vascular: Vessel Right Left  Radial Palpable Palpable  Brachial Palpable Palpable  Carotid Palpable, without bruit Palpable, without bruit  Aorta Non-palpable N/A  Femoral Non-palpable, faintly dopplerable Palpable  Popliteal Non-palpable Non-palpable  Peroneal Dopplerable Dopplerable  PT Non-dopplerable Dopplerable  DP Non-dopplerable Dopplerable   Gastrointestinal: soft, NTND, -G/R, - HSM, - masses, - CVAT B, Fem-fem graft does not have have a pulse but has a signal throughout  Musculoskeletal: M/S 5/5 throughout except 4/5 DF/PF in right foot, R lower leg is cool but not ischemic in appearance, intact motor in right foot  Neurologic: CN 2-12 intact , Pain and light touch intact in extremities except decreased in right foot , Motor exam as listed above  Psychiatric: Judgment intact, Mood & affect  appropriatefor pt's clinical situation  Dermatologic: See M/S exam for extremity exam, no rashes otherwise noted  Lymph : No Cervical, Axillary, or Inguinal lymphadenopathy   Medical Decision Making  AGUSTIN SWATEK is a 60 y.o. male who presents with: R leg critical limb ischemia in the setting of thrombosing fem-fem graft.   The graft is nearly thrombosed but the doppler signal suggests persistence of a flow channel.  This patient does not have a threatened limb as motor is intact as evident by the patient's ability to ambulate into the hospital and continued  peroneal artery signal.    I am admitting him for Heparin drip, pain control, and imaging in the morning to help determine the next step in this patient.  I discussed with the patient the natural history of critical limb ischemia: 25% require amputation in one year, 50% are able to maintain their limbs in one year, and 25-30% die in one year due to comorbidities.  Given the limb threatening status of this patient, I recommend an aggressive work up including proceeding with an: Aortogram, Bilateral runoff and intervention. I discussed with the patient the nature of angiographic procedures, especially the limited patencies of any endovascular intervention. The patient is aware of that the risks of an angiographic procedure include but are not limited to: bleeding, infection, access site complications, embolization, rupture of treated vessel, dissection, possible need for emergent surgical intervention, and possible need for surgical procedures to treat the patient's pathology. The patient is aware of the risks and agrees to proceed.  The procedure is scheduled for: tomorrow AM.  I discussed in depth with the patient the nature of atherosclerosis, and emphasized the importance of maximal medical management including strict control of blood pressure, blood glucose, and lipid levels, obtaining regular exercise, and cessation of smoking.   The patient is aware that without maximal medical management the underlying atherosclerotic disease process will progress, limiting the benefit of any interventions.  I will make Dr. Edilia Bo aware of this patient in the AM.  Leonides Sake, MD [facility]: [phone] Pager: [phone]  11/10/2011, 10:48 PM

## 2011-11-10 NOTE — Progress Notes (Signed)
ANTICOAGULATION CONSULT NOTE - Initial Consult  Pharmacy Consult for heparin Indication: R leg ischemia  Allergies  Allergen Reactions  . Penicillins Swelling    Patient Measurements: Height: 6\' 3"  (190.5 cm) Weight: 165 lb (74.844 kg) IBW/kg (Calculated) : 84.5   Vital Signs: Temp: 98 F (36.7 C) (12/16 2125) Temp src: Oral (12/16 2125) BP: 140/84 mmHg (12/16 2125) Pulse Rate: 79  (12/16 2125)  Labs:  Basename 11/10/11 2247  HGB 14.4  HCT 41.6  PLT 129*  APTT 33  LABPROT 13.4  INR 1.00  HEPARINUNFRC --  CREATININE --  CKTOTAL --  CKMB --  TROPONINI --   Estimated Creatinine Clearance: 105.2 ml/min (by C-G formula based on Cr of 0.51).  Medical History: Past Medical History  Diagnosis Date  . Diabetes mellitus   . Hyperlipidemia   . Hypertension   . Depression with anxiety   . Arthritis   . Leg pain   . Seizure disorder     Assessment: 38 yom with history of multilevel arterial occlusive disease to start IV heparin for right leg ischemia. Pt has been on heparin in the past. He is slightly thrombocytopenic. No anticoagulants PTA.   Goal of Therapy:  Heparin level 0.3-0.7 units/ml   Plan:  Heparin bolus 4000units IV x 1 Heparin gtt 1200units/hr  Check 6 hour heparin level Daily heparin level and CBC  Desyre Calma, Drake Leach 11/10/2011,11:27 PM

## 2011-11-10 NOTE — ED Notes (Signed)
CBG was 66. Notified Nurse Steward Drone.

## 2011-11-10 NOTE — ED Notes (Signed)
DR. Imogene Burn NOTIFIED ON PT'S ARRIVAL / LOCATION .

## 2011-11-10 NOTE — ED Notes (Signed)
I gave the patient a warm blanket. 

## 2011-11-11 ENCOUNTER — Other Ambulatory Visit: Payer: Self-pay

## 2011-11-11 ENCOUNTER — Encounter (HOSPITAL_COMMUNITY)
Admission: EM | Disposition: A | Discharge status: Home / Self Care | Payer: Self-pay | Source: Home / Self Care | Attending: Vascular Surgery

## 2011-11-11 ENCOUNTER — Inpatient Hospital Stay (HOSPITAL_COMMUNITY): Payer: Medicaid Other

## 2011-11-11 DIAGNOSIS — I2510 Atherosclerotic heart disease of native coronary artery without angina pectoris: Secondary | ICD-10-CM

## 2011-11-11 DIAGNOSIS — I70219 Atherosclerosis of native arteries of extremities with intermittent claudication, unspecified extremity: Secondary | ICD-10-CM

## 2011-11-11 DIAGNOSIS — Z01810 Encounter for preprocedural cardiovascular examination: Secondary | ICD-10-CM

## 2011-11-11 HISTORY — PX: ABDOMINAL AORTAGRAM: SHX5454

## 2011-11-11 HISTORY — PX: LOWER EXTREMITY ANGIOGRAM: SHX5508

## 2011-11-11 LAB — GLUCOSE, CAPILLARY
Glucose-Capillary: 96 mg/dL (ref 70–99)
Glucose-Capillary: 127 mg/dL — ABNORMAL HIGH (ref 70–99)
Glucose-Capillary: 62 mg/dL — ABNORMAL LOW (ref 70–99)
Glucose-Capillary: 77 mg/dL (ref 70–99)

## 2011-11-11 LAB — CBC
Hemoglobin: 13.4 g/dL (ref 13.0–17.0)
Platelets: 161 10*3/uL (ref 150–400)
RBC: 4.29 MIL/uL (ref 4.22–5.81)
WBC: 7.3 10*3/uL (ref 4.0–10.5)

## 2011-11-11 LAB — HEMOGLOBIN A1C: Hgb A1c MFr Bld: 6.4 % — ABNORMAL HIGH (ref <5.7)

## 2011-11-11 LAB — POCT ACTIVATED CLOTTING TIME: Activated Clotting Time: 155 seconds

## 2011-11-11 LAB — HEPARIN LEVEL (UNFRACTIONATED): Heparin Unfractionated: 0.25 IU/mL — ABNORMAL LOW (ref 0.30–0.70)

## 2011-11-11 SURGERY — ABDOMINAL AORTAGRAM
Anesthesia: Moderate Sedation

## 2011-11-11 MED ORDER — HEPARIN SODIUM (PORCINE) 1000 UNIT/ML IJ SOLN
INTRAMUSCULAR | Status: AC
  Filled 2011-11-11: qty 1

## 2011-11-11 MED ORDER — HEPARIN SOD (PORCINE) IN D5W 100 UNIT/ML IV SOLN
1200.0000 [IU]/h | INTRAVENOUS | Status: DC
  Administered 2011-11-11: 1200 [IU]/h via INTRAVENOUS
  Filled 2011-11-11: qty 250

## 2011-11-11 MED ORDER — HEPARIN SOD (PORCINE) IN D5W 100 UNIT/ML IV SOLN
1350.0000 [IU]/h | INTRAVENOUS | Status: DC
  Administered 2011-11-12 – 2011-11-13 (×2): 1350 [IU]/h via INTRAVENOUS
  Filled 2011-11-11 (×4): qty 250

## 2011-11-11 MED ORDER — HEPARIN (PORCINE) IN NACL 2-0.9 UNIT/ML-% IJ SOLN
INTRAMUSCULAR | Status: AC
  Filled 2011-11-11: qty 1000

## 2011-11-11 MED ORDER — MIDAZOLAM HCL 2 MG/2ML IJ SOLN
INTRAMUSCULAR | Status: AC
  Filled 2011-11-11: qty 2

## 2011-11-11 MED ORDER — SODIUM CHLORIDE 0.9 % IV SOLN
INTRAVENOUS | Status: DC
  Administered 2011-11-11 – 2011-11-13 (×3): via INTRAVENOUS

## 2011-11-11 MED ORDER — METFORMIN HCL 500 MG PO TABS
500.0000 mg | ORAL_TABLET | Freq: Two times a day (BID) | ORAL | Status: DC
  Administered 2011-11-13: 500 mg via ORAL
  Filled 2011-11-11 (×3): qty 1

## 2011-11-11 MED ORDER — EZETIMIBE 10 MG PO TABS
10.0000 mg | ORAL_TABLET | Freq: Every day | ORAL | Status: DC
  Administered 2011-11-11 – 2011-11-13 (×3): 10 mg via ORAL
  Filled 2011-11-11 (×3): qty 1

## 2011-11-11 MED ORDER — GLUCOSE 40 % PO GEL
ORAL | Status: AC
  Administered 2011-11-11: 37.5 g
  Filled 2011-11-11: qty 1

## 2011-11-11 MED ORDER — FENTANYL CITRATE 0.05 MG/ML IJ SOLN
INTRAMUSCULAR | Status: AC
  Filled 2011-11-11: qty 2

## 2011-11-11 MED ORDER — PNEUMOCOCCAL VAC POLYVALENT 25 MCG/0.5ML IJ INJ
0.5000 mL | INJECTION | INTRAMUSCULAR | Status: AC
  Administered 2011-11-12: 0.5 mL via INTRAMUSCULAR
  Filled 2011-11-11: qty 0.5

## 2011-11-11 MED ORDER — ONDANSETRON HCL 4 MG/2ML IJ SOLN: 4.0000 mg | Freq: Four times a day (QID) | INTRAMUSCULAR | Status: DC | PRN

## 2011-11-11 MED ORDER — LIDOCAINE HCL (PF) 1 % IJ SOLN
INTRAMUSCULAR | Status: AC
  Filled 2011-11-11: qty 30

## 2011-11-11 MED ORDER — PHENYTOIN SODIUM EXTENDED 100 MG PO CAPS
200.0000 mg | ORAL_CAPSULE | Freq: Every day | ORAL | Status: DC
  Administered 2011-11-11 – 2011-11-13 (×3): 200 mg via ORAL
  Filled 2011-11-11 (×3): qty 2

## 2011-11-11 MED ORDER — GLIPIZIDE 10 MG PO TABS
10.0000 mg | ORAL_TABLET | Freq: Two times a day (BID) | ORAL | Status: DC
  Administered 2011-11-11 – 2011-11-13 (×4): 10 mg via ORAL
  Filled 2011-11-11 (×6): qty 1

## 2011-11-11 MED ORDER — ACETAMINOPHEN 325 MG PO TABS: 650.0000 mg | ORAL_TABLET | ORAL | Status: DC | PRN

## 2011-11-11 MED ORDER — PHENYTOIN SODIUM EXTENDED 100 MG PO CAPS
300.0000 mg | ORAL_CAPSULE | Freq: Every day | ORAL | Status: DC
  Administered 2011-11-11 – 2011-11-12 (×3): 300 mg via ORAL
  Filled 2011-11-11 (×4): qty 3

## 2011-11-11 MED ORDER — NITROGLYCERIN 0.2 MG/ML ON CALL CATH LAB
INTRAVENOUS | Status: AC
  Filled 2011-11-11: qty 1

## 2011-11-11 NOTE — Interval H&P Note (Signed)
History and Physical Interval Note:  11/11/2011 7:28 AM  [name] [name]  has presented today for arteriography, with the diagnosis of R leg ischemia  The various methods of treatment have been discussed with the patient and family. After consideration of risks, benefits and other options for treatment, the patient has consented to  Procedure(s): ABDOMINAL AORTAGRAM LOWER EXTREMITY ANGIOGRAM via a left brachial approach.  The patients' history has been reviewed, patient examined, no change in status, stable for surgery.  I have reviewed the patients' chart and labs.  Questions were answered to the patient's satisfaction.     Emili Mcloughlin S

## 2011-11-11 NOTE — ED Notes (Signed)
CBG was 72. Notified Nurse Steward Drone.

## 2011-11-11 NOTE — ED Notes (Signed)
CBG increased to 77.  Heparin started.

## 2011-11-11 NOTE — Progress Notes (Signed)
ANTICOAGULATION CONSULT NOTE - Follow Up Consult  Pharmacy Consult for heparin Indication: RLE ischemia  Allergies  Allergen Reactions  . Penicillins Swelling    Patient Measurements: Height: 6\' 3"  (190.5 cm) Weight: 162 lb 14.4 oz (73.891 kg) IBW/kg (Calculated) : 84.5  Adjusted Body Weight:  Vital Signs: Temp: 98.4 F (36.9 C) (12/17 2056) Temp src: Oral (12/17 2056) BP: 150/88 mmHg (12/17 2056) Pulse Rate: 55  (12/17 2056)  Labs:  Basename 11/11/11 2134 11/11/11 0630 11/10/11 2247  HGB -- 13.4 14.4  HCT -- 40.2 41.6  PLT -- 161 129*  APTT -- -- 33  LABPROT -- -- 13.4  INR -- -- 1.00  HEPARINUNFRC 0.25* 0.16* --  CREATININE -- -- 0.78  CKTOTAL -- -- --  CKMB -- -- --  TROPONINI -- -- --   Estimated Creatinine Clearance: 103.9 ml/min (by C-G formula based on Cr of 0.78).   Medications:  Scheduled:    . aspirin EC  81 mg Oral Daily  . dextrose      . docusate sodium  100 mg Oral BID  . ezetimibe  10 mg Oral Daily  . famotidine (PEPCID) IV  20 mg Intravenous Q12H  . fentaNYL      . glipiZIDE  10 mg Oral BID AC  . heparin      . heparin      . heparin  4,000 Units Intravenous Once  . insulin aspart  0-20 Units Subcutaneous TID WC  . insulin aspart  0-5 Units Subcutaneous QHS  . lidocaine      . metFORMIN  500 mg Oral BID WC  . midazolam      . nitroGLYCERIN      . phenytoin  200 mg Oral Daily  . phenytoin  300 mg Oral QHS  . pneumococcal 23 valent vaccine  0.5 mL Intramuscular Tomorrow-1000  . DISCONTD: phenytoin  300 mg Oral BID    Assessment: 60 YOM s/p abdominal aortagram and LE angiogram this AM. Heparin level =0.25 (subtherapeutic).  Goal of Therapy:  Heparin level 0.3-0.7 units/ml   Plan:  1. Increase heparin gtt to 1350units/hr 2 Check 6hr heparin level at 0500  Zeigler, Tad Moore 11/11/2011,10:19 PM

## 2011-11-11 NOTE — Progress Notes (Signed)
ANTICOAGULATION CONSULT NOTE - Follow Up Consult  Pharmacy Consult for heparin Indication: Right leg ischemia  Allergies  Allergen Reactions  . Penicillins Swelling    Patient Measurements: Height: 6\' 3"  (190.5 cm) Weight: 162 lb 14.4 oz (73.891 kg) IBW/kg (Calculated) : 84.5  Adjusted Body Weight:   Vital Signs: Temp: 98.3 F (36.8 C) (12/17 0640) Temp src: Oral (12/17 0640) BP: 125/76 mmHg (12/17 0640) Pulse Rate: 54  (12/17 0803)  Labs:  Basename 11/11/11 0630 11/10/11 2247  HGB 13.4 14.4  HCT 40.2 41.6  PLT 161 129*  APTT -- 33  LABPROT -- 13.4  INR -- 1.00  HEPARINUNFRC 0.16* --  CREATININE -- 0.78  CKTOTAL -- --  CKMB -- --  TROPONINI -- --   Estimated Creatinine Clearance: 103.9 ml/min (by C-G formula based on Cr of 0.78).   Assessment: Patient is a 60 y.o M s/p abdominal aortagram and LE angiogram this AM.  Heparin level this AM was 0.16 at 6:30 AM but not sure when heparin drip was d/ced for procedure. To resume heparin drip at 3 PM today per Dr. Edilia Bo.  Goal of Therapy:  Heparin level 0.3-0.7 units/ml   Plan:  1) Resume heparin gtt at 1200 units/hr at 3 PM today 2) Check 6hr heparin level  Quintyn Dombek P 11/11/2011,9:48 AM

## 2011-11-11 NOTE — Consult Note (Signed)
CARDIOLOGY CONSULT NOTE  Patient ID: [name] [name], MRN: [id number], DOB/AGE: August 01, 1951 60 y.o. Admit date: 11/10/2011 Date of Consult: 11/11/2011  Primary Physician: Pamelia Hoit, MD Primary Cardiologist: Colleen Can. Deborah Chalk, M.D  Reason for Consultation: Preoperative Clearance, History of CAD  HPI: 60yom w/ PMHx significant for CAD, HTN, HLD, DMII, tobacco abuse and PAD who is requiring preop clearance for vascular surgery for right leg critical limb ischemia in the setting of thrombosing fem-fem graft.  Pt was in his usual state of health until about 3 days ago. He reports experiencing R leg pain on exertion. He is a Education administrator and is typically able to perform daily activities including climbing ladders and stairs without symptoms of chest pain, DOE, palpitations, dizziness, edema, orthopnea, PND or n/v. He is an active smoker at 1PPD.   He has a history of fem-fem graft surgery 05/12. Prior to this procedure, he underwent preoperative cardiac cath on 03/11 revealing LV dysfunction (EF 40%), posterior wall hypokinesis and totally occluded left circumflex marginal vessel with LCA and RCA collateral filling. He was felt to be a good candidate and medically managed. Lexiscan Myoview conducted prior to this procedure revealed no stress induced ischemia, but did show fixed thinning of inferior and associated wall motion abnormalities and EF 38%.  He is currently asymptomatic without complaints.   Diagnosis  . Diabetes mellitus  . Hyperlipidemia  . Hypertension  . Depression with anxiety  . Arthritis  . Leg pain  . Seizure disorder      Surgical History:  Past Surgical History  Procedure Date  . Thrombectomy     left to right fem-fem bypass  . Hernia repair   . Aortogram with iliac artery stenting 05/06/2011     Home Meds: Medication Sig  aspirin 81 MG tablet Take 81 mg by mouth daily.    glipiZIDE (GLUCOTROL) 10 MG tablet Take 10 mg by mouth 2 (two) times daily before a  meal.    METFORMIN HCL PO Take 500 mg by mouth 2 (two) times daily.   phenytoin (DILANTIN) 100 MG ER capsule Take 300 mg by mouth 2 (two) times daily. 200 mg in the morning. 300 mg in the evening.  Turmeric, Curcuma Longa, POWD Take 1 tablet by mouth daily.     Inpatient Medications:    . aspirin EC  81 mg Oral Daily  . docusate sodium  100 mg Oral BID  . famotidine (PEPCID) IV  20 mg Intravenous Q12H  . fentaNYL      . glipiZIDE  10 mg Oral BID AC  . heparin      . heparin      . heparin  4,000 Units Intravenous Once  . insulin aspart  0-20 Units Subcutaneous TID WC  . insulin aspart  0-5 Units Subcutaneous QHS  . lidocaine      . metFORMIN  500 mg Oral BID WC  . midazolam      . nitroGLYCERIN      . phenytoin  200 mg Oral Daily  . phenytoin  300 mg Oral QHS  . pneumococcal 23 valent vaccine  0.5 mL Intramuscular Tomorrow-1000  . DISCONTD: phenytoin  300 mg Oral BID    Allergies:  Allergies  Allergen Reactions  . Penicillins Swelling    History   Social History  . Marital Status: Single    Spouse Name: N/A    Number of Children: N/A  . Years of Education: N/A   Occupational History  . Not on  file.   Social History Main Topics  . Smoking status: Current Everyday Smoker -- 1.0 packs/day for 42 years    Types: Cigarettes  . Smokeless tobacco: Not on file  . Alcohol Use: No     alcohol abuse in the past  . Drug Use: No  . Sexually Active:    Other Topics Concern  . Not on file   Social History Narrative  . No narrative on file     Family History  Problem Relation Age of Onset  . Hypertension Mother   . Heart attack Mother   . Diabetes Mother   . Aneurysm Father   . Heart attack Sister   . Heart disease Brother     heart transplant     Review of Systems: General: negative for chills, fever, night sweats or weight changes.  Cardiovascular: Claudication; negative for chest pain, shortness of breath dyspnea on exertion, edema, orthopnea,  palpitations, or paroxysmal nocturnal dyspnea Dermatological: negative for rash Respiratory: negative for cough or wheezing Urologic: negative for hematuria Abdominal: negative for nausea, vomiting, diarrhea, bright red blood per rectum, melena, or hematemesis Neurologic: negative for visual changes, syncope, or dizziness All other systems reviewed and are otherwise negative except as noted above.  Labs:  Lab Results  Component Value Date   WBC 7.3 11/11/2011   HGB 13.4 11/11/2011   HCT 40.2 11/11/2011   MCV 93.7 11/11/2011   PLT 161 11/11/2011    Lab 11/10/11 2247  NA 135  K 3.8  CL 97  CO2 29  BUN 11  CREATININE 0.78  CALCIUM 10.4  GLUCOSE 77   Lab Results  Component Value Date   CHOL 343* 11/12/2010   HDL 38* 11/12/2010   LDLCALC See Comment mg/dL 40/98/1191   TRIG 478* 11/12/2010    Radiology/Studies:  CXR pending  Echo pending  EKG: Sinus rhythm 63bpm, no ST/T wave changes  Physical Exam: Blood pressure 125/76, pulse 54, temperature 98.3 F (36.8 C), temperature source Oral, resp. rate 18, height 6\' 3"  (1.905 m), weight 73.891 kg (162 lb 14.4 oz), SpO2 95.00%. General: Well developed, well nourished, black male in no acute distress. Head: Normocephalic, atraumatic, sclera non-icteric, no xanthomas, nares are without discharge.  Neck: Supple. Negative for carotid bruits. JVD not elevated. Lungs: Clear bilaterally to auscultation without wheezes, rales, or rhonchi. Breathing is unlabored. Heart: RRR with S1 S2. No murmurs, rubs, or gallops appreciated. Abdomen: Soft, non-tender, non-distended with normoactive bowel sounds. No rebound/guarding. No obvious abdominal masses. Msk:  Strength and tone appear normal for age. Extremities: No clubbing or cyanosis. No edema. Right extremity pulses absent. Left distal pedal pulses is 2+ Neuro: Alert and oriented X 3. Moves all extremities spontaneously. Psych:  Responds to questions appropriately with a normal affect.     Assessment and Plan:   Mr. Bruntz is a 59 yo w/ PMHx significant for CAD, HTN, HLD, DMII, tobacco abuse and PAD who is requiring preop clearance for vascular surgery for right leg critical limb ischemia in the setting of thrombosing fem-fem graft. Overall, he has significant cardiac risk factors with suboptimal compliance, but from a cardiac standpoint, is not found to be high risk. He denies ischemic or CHF symptoms without objective evidence of an acute underlying cardiac process. Unfortunately, he does have chronic conditions, some of which have not been managed on a regular basis due to inadequate of follow-up, nonadherance to medical therapy and lack of proactive risk reduction. These include:  1. CAD- pt with hypokinesis  of posterior wall and LV dysfunction evidenced by LVEF 40% on cardiac cath 03/11.   - will order repeat echo to reassess LV function   - CXR pending, will review for evidence of CHF 2. HL- pt reports acute leg pain when on statin therapy in the past  - will start on Zetia  3. Tobacco abuse  - would benefit from tobacco cessation counseling 4. Type II DM- well-controlled, HA1C 6.4  - continue Metformin, SSI 5. HTN- relatively well-controlled; will continue to monitor  Not fluid overloaded on exam. Depending on echo and cxr findings, would consider adding ACEI. Pt with evidence of asymptomatic sinus bradycardia, would hold off on BB.   Signed, HOPE, JESSICA PA-C 11/11/2011, 3:30 PM  R. Hurman Horn, PA-C 11/11/2011 4:47 PM  History reviewed with patient, no changes to be made. He has not had any symptoms to suggest angina or CHF.  Patient exam reveals clear lung fields to auscultation. Heart reveals no murmur gallop or rub. I cannot feel any pulses in the right foot.  All available labs, radiology testing, previous records reviewed.  Agree with documented assessment and plan. We will check a chest xray and update his echo.  He is intolerant of statins in the past  but may be able to tolerate Zetia.  He is felt to be a satisfactory risk for vascular surgery.  11/11/2011, 5:31 PM, Cassell Clement

## 2011-11-11 NOTE — ED Notes (Signed)
I gave the patient a pack of nabs, a cup of ice, and a coke.

## 2011-11-11 NOTE — ED Notes (Signed)
Pt w/hx of femoral artery bipass to ED c/o increased pain and coldness to R foot  x 3 days.  Pedal pulse to R foot unpalpable and foot is cool to touch.

## 2011-11-11 NOTE — Op Note (Signed)
11/11/2011  PREOP DIAGNOSIS: Occluded left to right fem-fem bypass graft.  POSTOP DIAGNOSIS: same  PROCEDURE:  1. Ultrasound-guided access to left brachial artery 2. Selective catheterization of descending thoracic aorta 3. Aortogram with bilateral lower extremity runoff  SURGEON: Di Kindle. Edilia Bo, MD, FACS  ANESTHESIA: local with sedation   EBL: minimal  INDICATIONS: This is a pleasant 60 year old gentleman who had been seen by Dr. Arbie Cookey with multilevel arterial occlusive disease. On 02/05/2010 he underwent left common iliac artery PTA and stenting and left common femoral artery endarterectomy with a left to right fem-fem bypass graft. This graft occluded in May of 2012 and I performed a thrombectomy of his graft. I was then asked to address a recurrent left common iliac artery stenosis. And 05/06/2011 he had PTA and stenting of the left common iliac artery and external iliac artery stenosis. He presented with the gradual onset of right leg pain which occurred approximately 3-4 days ago. He was noted to have an occluded fem-fem bypass graft. He is brought in for diagnostic arteriography.  TECHNIQUE: The patient was brought to the PV lab and received 1 mg of Versed and 50 mcg of fentanyl. The left arm was prepped and draped in the usual sterile fashion. Under ultrasound guidance, and after the skin was anesthetized, the left brachial artery was cannulated using a micropuncture needle.  The wire was introduced into the brachial artery and a micropuncture sheath introduced. This was exchanged over a Versacore wire for a short 5 [place] sheath. 2000 mg of heparin and 200 mcg of nitroglycerin were then flushed through the sheath. A long pigtail catheter was advanced over the wire and directed down into the descending thoracic aorta. The catheter was positioned just above the renal arteries. Aortogram was obtained. The catheter was then advanced below the renals and an oblique iliac projection was  obtained in in bilateral lower extremity runoff obtained. At the completion of the procedure the catheter was removed over a wire. The sheath was removed and pressure held for hemostasis. No immediate complications were noted.  FINDINGS:  1. Minimal atherosclerotic disease of the infrarenal aorta. 2. Patent left common iliac artery and external iliac artery stent. 3. Occluded right common iliac artery with reconstitution of the deep femoral artery on the right. 4. Bilateral superficial femoral artery occlusions. 5. Reconstitution of the above-knee popliteal artery on the right with two-vessel runoff via the peroneal and posterior tibial arteries. 6. Reconstitution of the above-knee popliteal artery on the left with two-vessel runoff via the peroneal and posterior tibial arteries. Disease in the distal posterior tibial artery with a possible AV fistula in the left foot.  Waverly Ferrari, MD, FACS [facility]  DATE OF OPERATION: 11/11/2011 DATE OF DICTATION: 11/11/2011

## 2011-11-11 NOTE — ED Notes (Signed)
Heparin by RN Herbert Seta.

## 2011-11-12 ENCOUNTER — Encounter (HOSPITAL_COMMUNITY): Payer: Self-pay | Admitting: Dietician

## 2011-11-12 DIAGNOSIS — I059 Rheumatic mitral valve disease, unspecified: Secondary | ICD-10-CM

## 2011-11-12 LAB — BASIC METABOLIC PANEL
BUN: 10 mg/dL (ref 6–23)
Chloride: 107 mEq/L (ref 96–112)
GFR calc Af Amer: >90 mL/min (ref >90)
Potassium: 3.8 mEq/L (ref 3.5–5.1)

## 2011-11-12 LAB — CBC
HCT: 38.6 % — ABNORMAL LOW (ref 39.0–52.0)
Platelets: 139 10*3/uL — ABNORMAL LOW (ref 150–400)
RDW: 13.9 % (ref 11.5–15.5)
WBC: 6 10*3/uL (ref 4.0–10.5)

## 2011-11-12 MED ORDER — ALPRAZOLAM 0.25 MG PO TABS
0.2500 mg | ORAL_TABLET | Freq: Once | ORAL | Status: AC
  Administered 2011-11-12: 0.25 mg via ORAL
  Filled 2011-11-12: qty 1

## 2011-11-12 MED ORDER — FAMOTIDINE 20 MG PO TABS
20.0000 mg | ORAL_TABLET | Freq: Two times a day (BID) | ORAL | Status: DC
  Administered 2011-11-12 – 2011-11-13 (×3): 20 mg via ORAL
  Filled 2011-11-12 (×4): qty 1

## 2011-11-12 NOTE — Progress Notes (Signed)
  Echocardiogram 2D Echocardiogram has been performed.  Dorena Cookey 11/12/2011, 12:49 PM

## 2011-11-12 NOTE — Progress Notes (Signed)
[facility]  Daily Progress Note  Assessment/Planning: CLI R leg, Thrombosed L-to-R fem-fem BPG   Echo pending  Once cardiac work-up complete, will D/C with follow-up appointment with Dr. Arbie Cookey for evaluation for Aortobifemoral bypass  Subjective    No sign. change in complaints  Objective Filed Vitals:   11/11/11 0640 11/11/11 0803 11/11/11 2056 11/12/11 0506  BP: 125/76  150/88 129/80  Pulse: 60 54 55 55  Temp: 98.3 F (36.8 C)  98.4 F (36.9 C) 98.2 F (36.8 C)  TempSrc: Oral  Oral Oral  Resp: 18  18 19   Height:      Weight:      SpO2: 95%  95% 97%    Intake/Output Summary (Last 24 hours) at 11/12/11 1124 Last data filed at 11/12/11 0800  Gross per 24 hour  Intake 2395.23 ml  Output   2350 ml  Net  45.23 ml    PULM  CTAB CV  RRR GI  soft, NTND VASC  R peroneal dopplerable, motor intact in R foot, foot warmer than admission  Laboratory CBC    Component Value Date/Time   WBC 6.0 11/12/2011 0600   WBC 5.9 07/28/2009 1533   HGB 12.8* 11/12/2011 0600   HGB 12.5* 07/28/2009 1533   HCT 38.6* 11/12/2011 0600   HCT 37.1* 07/28/2009 1533   PLT 139* 11/12/2011 0600   PLT 145 07/28/2009 1533    BMET    Component Value Date/Time   NA 140 11/12/2011 0600   K 3.8 11/12/2011 0600   CL 107 11/12/2011 0600   CO2 26 11/12/2011 0600   GLUCOSE 98 11/12/2011 0600   BUN 10 11/12/2011 0600   CREATININE 0.61 11/12/2011 0600   CALCIUM 9.2 11/12/2011 0600   GFRNONAA >90 11/12/2011 0600   GFRAA >90 11/12/2011 0600   Leonides Sake, MD [facility] Office: [phone] Pager: [phone]  11/12/2011, 11:24 AM

## 2011-11-12 NOTE — Progress Notes (Signed)
ANTICOAGULATION CONSULT NOTE - Follow Up Consult  Pharmacy Consult for heparin Indication: RLE ischemia  Allergies  Allergen Reactions  . Penicillins Swelling    Patient Measurements: Height: 6\' 3"  (190.5 cm) Weight: 162 lb 14.4 oz (73.891 kg) IBW/kg (Calculated) : 84.5    Vital Signs: Temp: 98.2 F (36.8 C) (12/18 0506) Temp src: Oral (12/18 0506) BP: 129/80 mmHg (12/18 0506) Pulse Rate: 55  (12/18 0506)  Labs:  Basename 11/12/11 0600 11/11/11 2134 11/11/11 0630 11/10/11 2247  HGB 12.8* -- 13.4 --  HCT 38.6* -- 40.2 41.6  PLT 139* -- 161 129*  APTT -- -- -- 33  LABPROT -- -- -- 13.4  INR -- -- -- 1.00  HEPARINUNFRC 0.41 0.25* 0.16* --  CREATININE 0.61 -- -- 0.78  CKTOTAL -- -- -- --  CKMB -- -- -- --  TROPONINI -- -- -- --   Estimated Creatinine Clearance: 103.9 ml/min (by C-G formula based on Cr of 0.61).   Medications:  Scheduled:     . aspirin EC  81 mg Oral Daily  . dextrose      . docusate sodium  100 mg Oral BID  . ezetimibe  10 mg Oral Daily  . famotidine (PEPCID) IV  20 mg Intravenous Q12H  . glipiZIDE  10 mg Oral BID AC  . insulin aspart  0-20 Units Subcutaneous TID WC  . insulin aspart  0-5 Units Subcutaneous QHS  . metFORMIN  500 mg Oral BID WC  . phenytoin  200 mg Oral Daily  . phenytoin  300 mg Oral QHS  . pneumococcal 23 valent vaccine  0.5 mL Intramuscular Tomorrow-1000    Assessment: 60 YOM s/p abdominal aortagram and LE angiogram POD#1. Heparin therapeutic. No bleeding ntoed.   Goal of Therapy:  Heparin level 0.3-0.7 units/ml   Plan:  1.Continue heparin gtt at 1350units/hr 2 F/U morning labs.   Elson Clan 11/12/2011,10:35 AM

## 2011-11-12 NOTE — Progress Notes (Signed)
Pt smokes 1 1/2 ppd. States he's eager to quit. Action stage. Recommended 21 mg patch x 6 weeks, 14 mg patch x 2 weeks and 7 mg patch x 2 weeks. Also to use 4 mg nicotine gum PRN. Discussed patch/gum use insrtuctions. Referred to 1-800 quit now for f/u and support. Discussed oral fixation substitutes, second hand smoke and in home smoking policy. Reviewed and gave pt Written education/contact information.

## 2011-11-12 NOTE — Progress Notes (Signed)
Subjective:  Patient doing well this am from heart standpoint.  No chest pain or dyspnea. Rhythm is NSR.  Objective:  Vital Signs in the last 24 hours: Temp:  [98.2 F (36.8 C)-98.4 F (36.9 C)] 98.2 F (36.8 C) (12/18 0506) Pulse Rate:  [54-55] 55  (12/18 0506) Resp:  [18-19] 19  (12/18 0506) BP: (129-150)/(80-88) 129/80 mmHg (12/18 0506) SpO2:  [95 %-97 %] 97 % (12/18 0506)  Intake/Output from previous day: 12/17 0701 - 12/18 0700 In: 2395.2 [P.O.:1560; I.V.:735.2; IV Piggyback:100] Out: 2450 [Urine:2450] Intake/Output from this shift:       . aspirin EC  81 mg Oral Daily  . dextrose      . docusate sodium  100 mg Oral BID  . ezetimibe  10 mg Oral Daily  . famotidine (PEPCID) IV  20 mg Intravenous Q12H  . glipiZIDE  10 mg Oral BID AC  . insulin aspart  0-20 Units Subcutaneous TID WC  . insulin aspart  0-5 Units Subcutaneous QHS  . metFORMIN  500 mg Oral BID WC  . nitroGLYCERIN      . phenytoin  200 mg Oral Daily  . phenytoin  300 mg Oral QHS  . pneumococcal 23 valent vaccine  0.5 mL Intramuscular Tomorrow-1000      . sodium chloride 50 mL/hr at 11/12/11 0532  . heparin 1,350 Units/hr (11/11/11 2239)  . DISCONTD: sodium chloride 125 mL/hr at 11/10/11 2358  . DISCONTD: heparin 1,200 Units/hr (11/11/11 0005)  . DISCONTD: heparin 1,200 Units/hr (11/11/11 1513)    Physical Exam: The patient appears to be in no distress.  Head and neck exam reveals that the pupils are equal and reactive.  The extraocular movements are full.  There is no scleral icterus.  Mouth and pharynx are benign.  No lymphadenopathy.  No carotid bruits.  The jugular venous pressure is normal.  Thyroid is not enlarged or tender.  Chest is clear to percussion and auscultation.  No rales or rhonchi.  Expansion of the chest is symmetrical.  Heart reveals no abnormal lift or heave.  First and second heart sounds are normal.  There is no murmur gallop rub or click.  The abdomen is soft and  nontender.  Bowel sounds are normoactive.  There is no hepatosplenomegaly or mass.  There are no abdominal bruits.  Extremities reveal right foot colder to touch.  No palpable pulse  Neurologic exam is normal strength and no lateralizing weakness.  No sensory deficits.  Integument reveals no rash  Lab Results:  Basename 11/12/11 0600 11/11/11 0630  WBC 6.0 7.3  HGB 12.8* 13.4  PLT 139* 161    Basename 11/12/11 0600 11/10/11 2247  NA 140 135  K 3.8 3.8  CL 107 97  CO2 26 29  GLUCOSE 98 77  BUN 10 11  CREATININE 0.61 0.78   No results found for this basename: TROPONINI:2,CK,MB:2 in the last 72 hours Hepatic Function Panel No results found for this basename: PROT,ALBUMIN,AST,ALT,ALKPHOS,BILITOT,BILIDIR,IBILI in the last 72 hours No results found for this basename: CHOL in the last 72 hours No results found for this basename: PROTIME in the last 72 hours  Imaging: Imaging results have been reviewed.  Chest xray satisfactory.  Cardiac Studies: 2D echo pending for today.  Assessment/Plan:  Patient Active Problem List  Diagnoses  . Atherosclerosis of native arteries of the extremities with intermittent claudication CAD with LV dysfunction      Plan: 2D echo today.      Continue present meds.  LOS: 2 days    Cassell Clement 11/12/2011, 7:57 AM

## 2011-11-13 ENCOUNTER — Encounter: Payer: Self-pay | Admitting: Vascular Surgery

## 2011-11-13 DIAGNOSIS — I70219 Atherosclerosis of native arteries of extremities with intermittent claudication, unspecified extremity: Principal | ICD-10-CM

## 2011-11-13 LAB — CBC
MCH: 31.1 pg (ref 26.0–34.0)
MCHC: 33.2 g/dL (ref 30.0–36.0)
MCV: 93.6 fL (ref 78.0–100.0)
Platelets: 151 10*3/uL (ref 150–400)
RDW: 13.8 % (ref 11.5–15.5)

## 2011-11-13 LAB — GLUCOSE, CAPILLARY
Glucose-Capillary: 99 mg/dL (ref 70–99)
Glucose-Capillary: 80 mg/dL (ref 70–99)

## 2011-11-13 MED ORDER — EZETIMIBE 10 MG PO TABS: 10.0000 mg | ORAL_TABLET | Freq: Every day | ORAL | Status: DC

## 2011-11-13 MED ORDER — OXYCODONE HCL 5 MG PO TABS: 5.0000 mg | ORAL_TABLET | ORAL | Status: DC | PRN

## 2011-11-13 NOTE — Discharge Summary (Addendum)
[facility] Discharge Summary  [name] [name] 01/21/1951 60 y.o. male  [id number]  Admission Date: 11/10/2011  Discharge Date: 11/13/11  Physician: Chuck Hint, MD  Admission Diagnosis: Atherosclerosis of native arteries of the extremities with intermittent claudication [440.21] LOWER EXTREMITY NUMBNESS   HPI:   This is a 60 y.o. male who presents with chief complaint: right calf cramping and pain with very short distance ambulation. The patient previous has undergone endovascular interventions on left iliac arterial system and left-to-right fem-fem bypass. The fem-fem bypass required thrombecomy on 04/09/11. Pain is described as cramping, severity 5-10/10, and associated with ambulation. Patient notes this pain is similar to the episode of thrombosis. Additionally, he notes the fem-fem pulse has been worsening over the last month. Patient has attempted to treat this pain with rest. The patient has intermittent rest pain symptoms also and no leg wounds/ulcers. Atherosclerotic risk factors include: DM, hyperlipidemia, HTN, smoking. Of note, baseline this patient has paraesthsias and anesthesia in the right foot. He was able to ambulated into the hospital.   Hospital Course:  The patient was admitted to the hospital and taken to the [facility] on  11/11/2011 and underwent  1. Ultrasound-guided access to left brachial artery  2. Selective catheterization of descending thoracic aorta  3. Aortogram with bilateral lower extremity runoff  And the findings were as follows: 1. Minimal atherosclerotic disease of the infrarenal aorta.  2. Patent left common iliac artery and external iliac artery stent.  3. Occluded right common iliac artery with reconstitution of the deep femoral artery on the right.  4. Bilateral superficial femoral artery occlusions.  5. Reconstitution of the above-knee popliteal artery on the right with two-vessel runoff via the peroneal and  posterior tibial arteries.  6. Reconstitution of the above-knee popliteal artery on the left with two-vessel runoff via the peroneal and posterior tibial arteries. Disease in the distal posterior tibial artery with a possible AV fistula in the left foot.  While in the hospital, the pt did undergo cardiac workup, which included an echo, which revealed 2D echo shows hypokinesis of inferior wall with grade 1 diastolic dysfunction. EF45-50%. This is old and was seen at cath in March 2011. Since then EF has improved from 40% to 45-50% It was cardiology's recommendation that it is Rec: Okay to proceed with vascular surgery next week. No further cardiac studies needed. His right foot had intact motor and a peroneal signal on doppler.  The patient was able to ambulate with short distance claudication.  The remainder of the hospital course consisted of increasing ambulation and increasing intake of solids without difficulty.    Basename 11/12/11 0600 11/10/11 2247  NA 140 135  K 3.8 3.8  CL 107 97  CO2 26 29  GLUCOSE 98 77  BUN 10 11  CALCIUM 9.2 10.4    Basename 11/13/11 0500 11/12/11 0600  WBC 6.1 6.0  HGB 12.6* 12.8*  HCT 37.9* 38.6*  PLT 151 139*    Basename 11/10/11 2247  INR 1.00     Discharge Instructions:   The patient is discharged to home with instructions to return to see Dr. Arbie Cookey in 1-2 weeks as he needs evaluation for an AortoBifemoral bypass graft.  He is given a Rx for roxicodone 5mg  one po q4hr prn pain #50 (fifty) no refill.   Discharge Diagnosis:  Atherosclerosis of native arteries of the extremities with intermittent claudication [440.21] LOWER EXTREMITY NUMBNESS  Secondary Diagnosis: Patient Active Problem List  Diagnoses  .  Atherosclerosis of native arteries of the extremities with intermittent claudication   Past Medical History  Diagnosis Date  . Diabetes mellitus   . Hyperlipidemia   . Hypertension   . Depression with anxiety   . Arthritis   . Leg  pain   . Seizure disorder        [name] [name], [name] [name]  Home Medication Instructions [id number]   Printed on:11/13/11 1614  Medication Information                    phenytoin (DILANTIN) 100 MG ER capsule Take 300 mg by mouth 2 (two) times daily. 200 mg in the morning. 300 mg in the evening.           glipiZIDE (GLUCOTROL) 10 MG tablet Take 10 mg by mouth 2 (two) times daily before a meal.             Turmeric, Curcuma Longa, POWD Take 1 tablet by mouth daily.            METFORMIN HCL PO Take 500 mg by mouth 2 (two) times daily.            aspirin 81 MG tablet Take 81 mg by mouth daily.             ezetimibe (ZETIA) 10 MG tablet Take 1 tablet (10 mg total) by mouth daily.           oxyCODONE (ROXICODONE) 5 MG immediate release tablet Take 1 tablet (5 mg total) by mouth every 4 (four) hours as needed for pain.             Disposition: home  Patient's condition: is Good   Newton Pigg, PA-C [facility] [phone] 11/13/2011  4:14 PM  Addendum  I have independently interviewed and examined the patient, and I agree with the physician assistant's findings.    Leonides Sake, MD [facility] [place] Office: [phone] Pager: [phone]  11/13/2011, 4:39 PM

## 2011-11-13 NOTE — Progress Notes (Signed)
ANTICOAGULATION CONSULT NOTE - Follow Up Consult  Pharmacy Consult for heparin Indication: Right ischemic leg  Allergies  Allergen Reactions  . Penicillins Swelling    Patient Measurements: Height: 6\' 3"  (190.5 cm) Weight: 162 lb 4.1 oz (73.6 kg) IBW/kg (Calculated) : 84.5  Adjusted Body Weight:   Vital Signs: Temp: 97.9 F (36.6 C) (12/19 0353) Temp src: Oral (12/19 0353) BP: 153/94 mmHg (12/19 0353) Pulse Rate: 54  (12/19 0353)  Labs:  Basename 11/13/11 0500 11/12/11 0600 11/11/11 2134 11/11/11 0630 11/10/11 2247  HGB 12.6* 12.8* -- -- --  HCT 37.9* 38.6* -- 40.2 --  PLT 151 139* -- 161 --  APTT -- -- -- -- 33  LABPROT -- -- -- -- 13.4  INR -- -- -- -- 1.00  HEPARINUNFRC 0.39 0.41 0.25* -- --  CREATININE -- 0.61 -- -- 0.78  CKTOTAL -- -- -- -- --  CKMB -- -- -- -- --  TROPONINI -- -- -- -- --   Estimated Creatinine Clearance: 103.5 ml/min (by C-G formula based on Cr of 0.61).   Assessment: Patient is a 60 y.o M on anticoagulation for leg ischemia with plan for possible d/c home today and vascular surgery next week.  Heparin level is at goal this AM.  Goal of Therapy:  Heparin level 0.3-0.7 units/ml   Plan:  1)  No change for heparin 2)  F/u with patient in AM if still here  Kayliana Codd P 11/13/2011,9:17 AM

## 2011-11-13 NOTE — Progress Notes (Signed)
[facility]  Daily Progress Note  Assessment/Planning:  CLI R leg, Thrombosed L-to-R fem-fem BPG, s/p diagnostic angiogram  Awaiting final rec. from cardiology  Once cardiac work-up complete, will D/C with follow-up appointment with Dr. Arbie Cookey for evaluation for Aortobifemoral bypass  Subjective   Sx unchg  Objective Filed Vitals:   11/12/11 1330 11/12/11 2031 11/12/11 2100 11/13/11 0353  BP: 126/61 171/97 140/90 153/94  Pulse: 57 54  54  Temp: 98 F (36.7 C) 98.1 F (36.7 C)  97.9 F (36.6 C)  TempSrc: Oral Oral  Oral  Resp: 20 20  19   Height:      Weight:    162 lb 4.1 oz (73.6 kg)  SpO2: 95% 99%  95%    Intake/Output Summary (Last 24 hours) at 11/13/11 0852 Last data filed at 11/13/11 0500  Gross per 24 hour  Intake   2321 ml  Output    950 ml  Net   1371 ml    PULM CTAB  CV RRR  GI soft, NTND  VASC  R peroneal dopplerable: continuous signal, motor intact in R foot, foot warmer than admission  Laboratory CBC    Component Value Date/Time   WBC 6.1 11/13/2011 0500   WBC 5.9 07/28/2009 1533   HGB 12.6* 11/13/2011 0500   HGB 12.5* 07/28/2009 1533   HCT 37.9* 11/13/2011 0500   HCT 37.1* 07/28/2009 1533   PLT 151 11/13/2011 0500   PLT 145 07/28/2009 1533    BMET    Component Value Date/Time   NA 140 11/12/2011 0600   K 3.8 11/12/2011 0600   CL 107 11/12/2011 0600   CO2 26 11/12/2011 0600   GLUCOSE 98 11/12/2011 0600   BUN 10 11/12/2011 0600   CREATININE 0.61 11/12/2011 0600   CALCIUM 9.2 11/12/2011 0600   GFRNONAA >90 11/12/2011 0600   GFRAA >90 11/12/2011 0600    Leonides Sake, MD [facility] [place] Office: [phone] Pager: [phone]  11/13/2011, 8:52 AM

## 2011-11-13 NOTE — Progress Notes (Signed)
Charting error.

## 2011-11-13 NOTE — Progress Notes (Signed)
Subjective:  No chest pain or dyspnea.  Rhythm is NSR.  Objective:  Vital Signs in the last 24 hours: Temp:  [97.9 F (36.6 C)-98.1 F (36.7 C)] 97.9 F (36.6 C) (12/19 0353) Pulse Rate:  [54-57] 54  (12/19 0353) Resp:  [19-20] 19  (12/19 0353) BP: (126-171)/(61-97) 153/94 mmHg (12/19 0353) SpO2:  [95 %-99 %] 95 % (12/19 0353) Weight:  [162 lb 4.1 oz (73.6 kg)] 162 lb 4.1 oz (73.6 kg) (12/19 0353)  Intake/Output from previous day: 12/18 0701 - 12/19 0700 In: 2681 [P.O.:1920; I.V.:761] Out: 1150 [Urine:1150] Intake/Output from this shift:       . ALPRAZolam  0.25 mg Oral Once  . aspirin EC  81 mg Oral Daily  . docusate sodium  100 mg Oral BID  . ezetimibe  10 mg Oral Daily  . famotidine  20 mg Oral BID  . glipiZIDE  10 mg Oral BID AC  . insulin aspart  0-20 Units Subcutaneous TID WC  . insulin aspart  0-5 Units Subcutaneous QHS  . metFORMIN  500 mg Oral BID WC  . phenytoin  200 mg Oral Daily  . phenytoin  300 mg Oral QHS  . pneumococcal 23 valent vaccine  0.5 mL Intramuscular Tomorrow-1000  . DISCONTD: famotidine (PEPCID) IV  20 mg Intravenous Q12H      . sodium chloride 50 mL/hr at 11/13/11 0233  . heparin 1,350 Units/hr (11/13/11 0454)    Physical Exam: The patient appears to be in no distress.  Head and neck exam reveals that the pupils are equal and reactive.  The extraocular movements are full.  There is no scleral icterus.  Mouth and pharynx are benign.  No lymphadenopathy.  No carotid bruits.  The jugular venous pressure is normal.  Thyroid is not enlarged or tender.  Chest is clear to percussion and auscultation.  No rales or rhonchi.  Expansion of the chest is symmetrical.  Heart reveals no abnormal lift or heave.  First and second heart sounds are normal.  There is no murmur gallop rub or click.  The abdomen is soft and nontender.  Bowel sounds are normoactive.  There is no hepatosplenomegaly or mass.  There are no abdominal bruits.  Extremities right  foot cool to touch.  No palpable pulse.  Neurologic exam is normal strength and no lateralizing weakness.  No sensory deficits.  Integument reveals no rash  Lab Results:  Basename 11/13/11 0500 11/12/11 0600  WBC 6.1 6.0  HGB 12.6* 12.8*  PLT 151 139*    Basename 11/12/11 0600 11/10/11 2247  NA 140 135  K 3.8 3.8  CL 107 97  CO2 26 29  GLUCOSE 98 77  BUN 10 11  CREATININE 0.61 0.78   No results found for this basename: TROPONINI:2,CK,MB:2 in the last 72 hours Hepatic Function Panel No results found for this basename: PROT,ALBUMIN,AST,ALT,ALKPHOS,BILITOT,BILIDIR,IBILI in the last 72 hours No results found for this basename: CHOL in the last 72 hours No results found for this basename: PROTIME in the last 72 hours  Imaging: Imaging results have been reviewed  Cardiac Studies: 2D echo shows hypokinesis of inferior wall with grade 1 diastolic dysfunction.  EF45-50%.  This is old and was seen at cath in March 2011.  Since then EF has improved from 40% to 45-50% Assessment/Plan:  Patient Active Problem List  Diagnoses  . Atherosclerosis of native arteries of the extremities with intermittent claudication  Rec:  Okay to proceed with vascular surgery next week.  No  further cardiac studies needed. Okay for discharge.    LOS: 3 days    Cassell Clement 11/13/2011, 9:04 AM

## 2011-11-15 ENCOUNTER — Telehealth: Payer: Self-pay | Admitting: Cardiology

## 2011-11-15 NOTE — Telephone Encounter (Signed)
Advised patient

## 2011-11-15 NOTE — Telephone Encounter (Signed)
Left message

## 2011-11-15 NOTE — Telephone Encounter (Signed)
New Problem:    Patient called having an overall body ache, headache and shortness of breath that he believes is due to his ezetimibe (ZETIA) 10 MG tablet. Please advise.

## 2011-11-15 NOTE — Telephone Encounter (Signed)
Did discuss with  Dr. Patty Sermons and will have patient hold Zetia and contact PCP if not any better.

## 2011-11-21 ENCOUNTER — Encounter (HOSPITAL_COMMUNITY): Payer: Self-pay | Admitting: Pharmacy Technician

## 2011-11-21 ENCOUNTER — Other Ambulatory Visit: Payer: Self-pay | Admitting: *Deleted

## 2011-11-21 ENCOUNTER — Encounter: Payer: Self-pay | Admitting: Vascular Surgery

## 2011-11-21 ENCOUNTER — Ambulatory Visit (INDEPENDENT_AMBULATORY_CARE_PROVIDER_SITE_OTHER): Payer: Medicaid Other | Admitting: Vascular Surgery

## 2011-11-21 VITALS — BP 169/93 | HR 108 | Resp 20 | Ht 75.0 in | Wt 163.0 lb

## 2011-11-21 DIAGNOSIS — I739 Peripheral vascular disease, unspecified: Secondary | ICD-10-CM

## 2011-11-21 NOTE — Progress Notes (Signed)
[facility] [place] History and Physical    Patient name: [name] [name] MRN: [id number] DOB: 09/14/1951 Sex: male       HISTORY OF PRESENT ILLNESS: The patient presents today for further discussion of his right leg ischemia. He has a complex past history. He initially had right leg severely limiting claudication and underwent left iliac angioplasty and a left to right fem-fem bypass. This was in March of 2011. He presented with occlusion of his graft in May of 2012 and underwent thrombectomy of this graft and then re\re angioplasty of his left iliac vessels. He presented in mid-December with recurrent symptoms and a recurrent occlusion of his fem-fem bypass. He underwent arteriography on 11/11/2011 confirming occlusion of his right common iliac artery and his left to right fem-fem bypass. 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 He also has bilateral superficial artery occlusions. He has had continued total leg claudication on the right and rest pain in his right foot. He has no tissue loss currently.  Past Medical History  Diagnosis Date  . Diabetes mellitus   . Hyperlipidemia   . Hypertension   . Depression with anxiety   . Arthritis   . Leg pain   . Seizure disorder     Past Surgical History  Procedure Date  . Thrombectomy     left to right fem-fem bypass  . Hernia repair   . Aortogram with iliac artery stenting 05/06/2011    History   Social History  . Marital Status: Single    Spouse Name: N/A    Number of Children: N/A  . Years of Education: N/A   Occupational History  . Not on file.   Social History Main Topics  . Smoking status: Current Everyday Smoker -- 1.5 packs/day for 42 years    Types: Cigarettes  . Smokeless tobacco: Not on file  . Alcohol Use: No     alcohol abuse in the past  . Drug Use: No  . Sexually Active: Not on file   Other Topics Concern  . Not on file   Social History Narrative  . No narrative  on file    Family History  Problem Relation Age of Onset  . Hypertension Mother   . Heart attack Mother   . Diabetes Mother   . Aneurysm Father   . Heart attack Sister   . Heart disease Brother     heart transplant    Allergies  Allergen Reactions  . Penicillins Swelling  . Zetia (Ezetimibe)     Aches all over, short of breath    Prior to Admission medications   Medication Sig Start Date End Date Taking? Authorizing Provider  aspirin 81 MG tablet Take 81 mg by mouth daily.     Yes Historical Provider, MD  glipiZIDE (GLUCOTROL) 10 MG tablet Take 10 mg by mouth 2 (two) times daily before a meal.     Yes Historical Provider, MD  METFORMIN HCL PO Take 500 mg by mouth 2 (two) times daily.    Yes Historical Provider, MD  oxyCODONE (ROXICODONE) 5 MG immediate release tablet Take 1 tablet (5 mg total) by mouth every 4 (four) hours as needed for pain. 11/13/11 11/23/11 Yes Newton Pigg, PA  phenytoin (DILANTIN) 100 MG ER capsule Take 300 mg by mouth 2 (two) times daily. 200 mg in the morning. 300 mg in the evening.   Yes Historical Provider, MD  Turmeric, Corwin Levins, POWD Take 1  tablet by mouth daily.    Yes Historical Provider, MD     REVIEW OF SYSTEMS: Cardiovascular: No chest pain, chest pressure, palpitations, orthopnea, or dyspnea on exertion. No claudication or rest pain,  No history of DVT or phlebitis. Pulmonary: No productive cough, asthma or wheezing. Neurologic: No weakness, paresthesias, aphasia, or amaurosis. No dizziness. Hematologic: No bleeding problems or clotting disorders. Musculoskeletal: No joint pain or joint swelling. Gastrointestinal: No blood in stool or hematemesis Genitourinary: No dysuria or hematuria. Psychiatric:: No history of major depression. Integumentary: No rashes or ulcers. Constitutional: No fever or chills.  PHYSICAL EXAMINATION:  Filed Vitals:   11/21/11 1151  BP: 169/93  Pulse: 108  Resp: 20    General: The patient appears  their stated age. Pulmonary: There is a good air exchange bilaterally without wheezing or rales. Abdomen: Soft and non-tender with normal pitch bowel sounds. Musculoskeletal: There are no major deformities.   Neurologic: No focal weakness or paresthesias are detected, Skin: There are no ulcer or rashes noted. Psychiatric: The patient has normal affect. Cardiovascular: There is a regular rate and rhythm without significant murmur appreciated. He does have palpable left femoral pulse and absent right femoral pulse. 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  Plan: I discussed options of observation which is not possible due to his rest pain, thrombectomy of his left to right fem-fem bypass, or aortobifemoral bypass. I have recommended aortofemoral bypass due to his episodes of recurrent occlusion of his fem-fem bypass in his known iliac occlusive disease. He understands the magnitude the procedure. I explained less than 5% chance of mortality and and 15% chance of complication prolonged hospitalization. Also explained the mom recovery after this magnitude of the operation. He was cleared for surgery from a cardiac standpoint during his recent admission to the hospital. Surgery is scheduled for 11/25/2011  EARLY,TODD F 12/27/20121:21 PM

## 2011-11-22 ENCOUNTER — Encounter (HOSPITAL_COMMUNITY): Payer: Self-pay

## 2011-11-22 ENCOUNTER — Inpatient Hospital Stay (HOSPITAL_COMMUNITY): Admission: RE | Admit: 2011-11-22 | Payer: Medicaid Other | Source: Ambulatory Visit

## 2011-11-22 NOTE — Progress Notes (Signed)
Clearance note from Dr. Patty Sermons in epic on 11/11/11.

## 2011-11-24 MED ORDER — VANCOMYCIN HCL IN DEXTROSE 1-5 GM/200ML-% IV SOLN
1000.0000 mg | INTRAVENOUS | Status: AC
  Administered 2011-11-25: 1000 mg via INTRAVENOUS
  Filled 2011-11-24: qty 200

## 2011-11-25 ENCOUNTER — Encounter (HOSPITAL_COMMUNITY): Payer: Self-pay | Admitting: *Deleted

## 2011-11-25 ENCOUNTER — Ambulatory Visit (HOSPITAL_COMMUNITY): Payer: Medicaid Other | Admitting: *Deleted

## 2011-11-25 ENCOUNTER — Inpatient Hospital Stay (HOSPITAL_COMMUNITY)
Admission: RE | Admit: 2011-11-25 | Discharge: 2011-12-04 | DRG: 238 | Disposition: A | Discharge status: Home Health | Payer: Medicaid Other | Source: Ambulatory Visit | Attending: Vascular Surgery | Admitting: Vascular Surgery

## 2011-11-25 ENCOUNTER — Encounter (HOSPITAL_COMMUNITY)
Admission: RE | Disposition: A | Discharge status: Home Health | Payer: Self-pay | Source: Ambulatory Visit | Attending: Vascular Surgery

## 2011-11-25 ENCOUNTER — Ambulatory Visit (HOSPITAL_COMMUNITY): Payer: Medicaid Other

## 2011-11-25 ENCOUNTER — Other Ambulatory Visit: Payer: Self-pay | Admitting: Vascular Surgery

## 2011-11-25 DIAGNOSIS — I7409 Other arterial embolism and thrombosis of abdominal aorta: Secondary | ICD-10-CM | POA: Diagnosis present

## 2011-11-25 DIAGNOSIS — Y832 Surgical operation with anastomosis, bypass or graft as the cause of abnormal reaction of the patient, or of later complication, without mention of misadventure at the time of the procedure: Secondary | ICD-10-CM | POA: Diagnosis present

## 2011-11-25 DIAGNOSIS — I70219 Atherosclerosis of native arteries of extremities with intermittent claudication, unspecified extremity: Secondary | ICD-10-CM

## 2011-11-25 DIAGNOSIS — I252 Old myocardial infarction: Secondary | ICD-10-CM

## 2011-11-25 DIAGNOSIS — I70229 Atherosclerosis of native arteries of extremities with rest pain, unspecified extremity: Secondary | ICD-10-CM | POA: Diagnosis present

## 2011-11-25 DIAGNOSIS — M129 Arthropathy, unspecified: Secondary | ICD-10-CM | POA: Diagnosis present

## 2011-11-25 DIAGNOSIS — I10 Essential (primary) hypertension: Secondary | ICD-10-CM | POA: Diagnosis present

## 2011-11-25 DIAGNOSIS — I743 Embolism and thrombosis of arteries of the lower extremities: Secondary | ICD-10-CM

## 2011-11-25 DIAGNOSIS — E785 Hyperlipidemia, unspecified: Secondary | ICD-10-CM | POA: Diagnosis present

## 2011-11-25 DIAGNOSIS — I700 Atherosclerosis of aorta: Principal | ICD-10-CM | POA: Diagnosis present

## 2011-11-25 DIAGNOSIS — F341 Dysthymic disorder: Secondary | ICD-10-CM | POA: Diagnosis present

## 2011-11-25 DIAGNOSIS — I2510 Atherosclerotic heart disease of native coronary artery without angina pectoris: Secondary | ICD-10-CM | POA: Diagnosis present

## 2011-11-25 DIAGNOSIS — F17200 Nicotine dependence, unspecified, uncomplicated: Secondary | ICD-10-CM | POA: Diagnosis present

## 2011-11-25 DIAGNOSIS — I739 Peripheral vascular disease, unspecified: Secondary | ICD-10-CM

## 2011-11-25 DIAGNOSIS — E119 Type 2 diabetes mellitus without complications: Secondary | ICD-10-CM | POA: Diagnosis present

## 2011-11-25 DIAGNOSIS — G40802 Other epilepsy, not intractable, without status epilepticus: Secondary | ICD-10-CM | POA: Diagnosis present

## 2011-11-25 DIAGNOSIS — T82898A Other specified complication of vascular prosthetic devices, implants and grafts, initial encounter: Secondary | ICD-10-CM | POA: Diagnosis present

## 2011-11-25 HISTORY — PX: AORTA - BILATERAL FEMORAL ARTERY BYPASS GRAFT: SHX1175

## 2011-11-25 LAB — BASIC METABOLIC PANEL
BUN: 8 mg/dL (ref 6–23)
Calcium: 8.3 mg/dL — ABNORMAL LOW (ref 8.4–10.5)
Creatinine, Ser: 0.54 mg/dL (ref 0.50–1.35)
GFR calc Af Amer: >90 mL/min (ref >90)

## 2011-11-25 LAB — URINALYSIS, ROUTINE W REFLEX MICROSCOPIC
Glucose, UA: NEGATIVE mg/dL
Ketones, ur: NEGATIVE mg/dL
Leukocytes, UA: NEGATIVE
Nitrite: NEGATIVE
Protein, ur: NEGATIVE mg/dL

## 2011-11-25 LAB — BLOOD GAS, ARTERIAL
Acid-Base Excess: 3.3 mmol/L — ABNORMAL HIGH (ref 0.0–2.0)
Bicarbonate: 27.8 mEq/L — ABNORMAL HIGH (ref 20.0–24.0)
Bicarbonate: 28.3 mEq/L — ABNORMAL HIGH (ref 20.0–24.0)
Drawn by: 2063661
FIO2: 0.21 %
Patient temperature: 98.6
Patient temperature: 98.6
TCO2: 29.9 mmol/L (ref 0–100)
pH, Arterial: 7.41 (ref 7.350–7.450)

## 2011-11-25 LAB — SURGICAL PCR SCREEN: MRSA, PCR: NEGATIVE

## 2011-11-25 LAB — COMPREHENSIVE METABOLIC PANEL
ALT: 17 U/L (ref 0–53)
AST: 21 U/L (ref 0–37)
Albumin: 3.8 g/dL (ref 3.5–5.2)
Alkaline Phosphatase: 72 U/L (ref 39–117)
Calcium: 9.7 mg/dL (ref 8.4–10.5)
Potassium: 4.3 mEq/L (ref 3.5–5.1)
Sodium: 137 mEq/L (ref 135–145)
Total Protein: 6.8 g/dL (ref 6.0–8.3)

## 2011-11-25 LAB — DIFFERENTIAL
Eosinophils Absolute: 0.2 10*3/uL (ref 0.0–0.7)
Eosinophils Relative: 4 % (ref 0–5)
Lymphocytes Relative: 35 % (ref 12–46)
Lymphs Abs: 2.4 10*3/uL (ref 0.7–4.0)
Monocytes Relative: 10 % (ref 3–12)

## 2011-11-25 LAB — CBC
HCT: 35.4 % — ABNORMAL LOW (ref 39.0–52.0)
Hemoglobin: 13.5 g/dL (ref 13.0–17.0)
MCH: 30.8 pg (ref 26.0–34.0)
MCH: 31 pg (ref 26.0–34.0)
MCHC: 33.3 g/dL (ref 30.0–36.0)
MCHC: 33.3 g/dL (ref 30.0–36.0)
MCV: 92.9 fL (ref 78.0–100.0)
Platelets: 125 10*3/uL — ABNORMAL LOW (ref 150–400)
Platelets: 153 10*3/uL (ref 150–400)
RDW: 13.6 % (ref 11.5–15.5)
RDW: 13.6 % (ref 11.5–15.5)

## 2011-11-25 LAB — GLUCOSE, CAPILLARY: Glucose-Capillary: 123 mg/dL — ABNORMAL HIGH (ref 70–99)

## 2011-11-25 LAB — APTT: aPTT: 32 seconds (ref 24–37)

## 2011-11-25 LAB — PROTIME-INR: Prothrombin Time: 15.2 seconds (ref 11.6–15.2)

## 2011-11-25 SURGERY — CREATION, BYPASS, ARTERIAL, AORTA TO FEMORAL, BILATERAL, USING GRAFT
Anesthesia: General | Site: Abdomen | Wound class: Clean

## 2011-11-25 MED ORDER — ONDANSETRON HCL 4 MG/2ML IJ SOLN
INTRAMUSCULAR | Status: DC | PRN
  Administered 2011-11-25: 4 mg via INTRAVENOUS

## 2011-11-25 MED ORDER — FENTANYL CITRATE 0.05 MG/ML IJ SOLN
INTRAMUSCULAR | Status: DC | PRN
  Administered 2011-11-25 (×2): 100 ug via INTRAVENOUS
  Administered 2011-11-25 (×6): 50 ug via INTRAVENOUS
  Administered 2011-11-25: 100 ug via INTRAVENOUS
  Administered 2011-11-25 (×3): 50 ug via INTRAVENOUS
  Administered 2011-11-25: 100 ug via INTRAVENOUS
  Administered 2011-11-25: 50 ug via INTRAVENOUS
  Administered 2011-11-25: 100 ug via INTRAVENOUS

## 2011-11-25 MED ORDER — POTASSIUM CHLORIDE IN NACL 20-0.9 MEQ/L-% IV SOLN
INTRAVENOUS | Status: DC
  Administered 2011-11-25 – 2011-12-02 (×11): via INTRAVENOUS
  Filled 2011-11-25 (×16): qty 1000

## 2011-11-25 MED ORDER — HYDROMORPHONE HCL PF 1 MG/ML IJ SOLN
INTRAMUSCULAR | Status: AC
  Filled 2011-11-25: qty 1

## 2011-11-25 MED ORDER — SODIUM CHLORIDE 0.9 % IV SOLN
250.0000 mg | Freq: Two times a day (BID) | INTRAVENOUS | Status: DC
  Administered 2011-11-25 – 2011-12-01 (×12): 250 mg via INTRAVENOUS
  Filled 2011-11-25 (×26): qty 5

## 2011-11-25 MED ORDER — MANNITOL 25 % IV SOLN
INTRAVENOUS | Status: DC | PRN
  Administered 2011-11-25 (×2): 12.5 g via INTRAVENOUS

## 2011-11-25 MED ORDER — LACTATED RINGERS IV SOLN
INTRAVENOUS | Status: DC | PRN
  Administered 2011-11-25 (×2): via INTRAVENOUS

## 2011-11-25 MED ORDER — PROPOFOL 10 MG/ML IV EMUL
INTRAVENOUS | Status: DC | PRN
  Administered 2011-11-25: 50 mg via INTRAVENOUS
  Administered 2011-11-25: 150 mg via INTRAVENOUS

## 2011-11-25 MED ORDER — PHENYLEPHRINE HCL 10 MG/ML IJ SOLN
INTRAMUSCULAR | Status: DC | PRN
  Administered 2011-11-25: 40 ug via INTRAVENOUS
  Administered 2011-11-25: 20 ug via INTRAVENOUS

## 2011-11-25 MED ORDER — DOCUSATE SODIUM 100 MG PO CAPS
100.0000 mg | ORAL_CAPSULE | Freq: Every day | ORAL | Status: DC
  Administered 2011-12-02 – 2011-12-04 (×3): 100 mg via ORAL
  Filled 2011-11-25 (×7): qty 1

## 2011-11-25 MED ORDER — FAMOTIDINE IN NACL 20-0.9 MG/50ML-% IV SOLN
20.0000 mg | Freq: Two times a day (BID) | INTRAVENOUS | Status: DC
  Administered 2011-11-25 – 2011-12-01 (×12): 20 mg via INTRAVENOUS
  Filled 2011-11-25 (×15): qty 50

## 2011-11-25 MED ORDER — SODIUM CHLORIDE 0.9 % IJ SOLN: 9.0000 mL | INTRAMUSCULAR | Status: DC | PRN

## 2011-11-25 MED ORDER — HETASTARCH-ELECTROLYTES 6 % IV SOLN
INTRAVENOUS | Status: DC | PRN
  Administered 2011-11-25: 09:00:00 via INTRAVENOUS

## 2011-11-25 MED ORDER — SODIUM CHLORIDE 0.9 % IR SOLN
Status: DC | PRN
  Administered 2011-11-25: 1000 mL

## 2011-11-25 MED ORDER — DIPHENHYDRAMINE HCL 12.5 MG/5ML PO ELIX
12.5000 mg | ORAL_SOLUTION | Freq: Four times a day (QID) | ORAL | Status: DC | PRN
  Filled 2011-11-25: qty 5

## 2011-11-25 MED ORDER — GUAIFENESIN-DM 100-10 MG/5ML PO SYRP: 15.0000 mL | ORAL_SOLUTION | ORAL | Status: DC | PRN

## 2011-11-25 MED ORDER — LACTATED RINGERS IV SOLN
INTRAVENOUS | Status: DC | PRN
  Administered 2011-11-25 (×3): via INTRAVENOUS

## 2011-11-25 MED ORDER — LABETALOL HCL 5 MG/ML IV SOLN
INTRAVENOUS | Status: DC | PRN
  Administered 2011-11-25: 5 mg via INTRAVENOUS

## 2011-11-25 MED ORDER — ONDANSETRON HCL 4 MG/2ML IJ SOLN
4.0000 mg | Freq: Four times a day (QID) | INTRAMUSCULAR | Status: DC | PRN
  Administered 2011-11-28: 4 mg via INTRAVENOUS
  Filled 2011-11-25: qty 2

## 2011-11-25 MED ORDER — HYDROMORPHONE HCL PF 1 MG/ML IJ SOLN
0.2500 mg | INTRAMUSCULAR | Status: DC | PRN
  Administered 2011-11-25 (×4): 0.5 mg via INTRAVENOUS

## 2011-11-25 MED ORDER — NALOXONE HCL 0.4 MG/ML IJ SOLN: 0.4000 mg | INTRAMUSCULAR | Status: DC | PRN

## 2011-11-25 MED ORDER — LACTATED RINGERS IV SOLN
INTRAVENOUS | Status: DC | PRN
  Administered 2011-11-25: 07:00:00 via INTRAVENOUS

## 2011-11-25 MED ORDER — MAGNESIUM SULFATE 40 MG/ML IJ SOLN
2.0000 g | Freq: Once | INTRAMUSCULAR | Status: AC | PRN
  Administered 2011-11-25: 2 g via INTRAVENOUS
  Filled 2011-11-25 (×2): qty 100

## 2011-11-25 MED ORDER — ONDANSETRON HCL 4 MG/2ML IJ SOLN: 4.0000 mg | Freq: Four times a day (QID) | INTRAMUSCULAR | Status: DC | PRN

## 2011-11-25 MED ORDER — METOPROLOL TARTRATE 1 MG/ML IV SOLN: 2.0000 mg | INTRAVENOUS | Status: DC | PRN

## 2011-11-25 MED ORDER — NEOSTIGMINE METHYLSULFATE 1 MG/ML IJ SOLN
INTRAMUSCULAR | Status: DC | PRN
  Administered 2011-11-25: 4 mg via INTRAVENOUS

## 2011-11-25 MED ORDER — DOPAMINE-DEXTROSE 3.2-5 MG/ML-% IV SOLN
3.0000 ug/kg/min | INTRAVENOUS | Status: DC
  Filled 2011-11-25: qty 250

## 2011-11-25 MED ORDER — GLYCOPYRROLATE 0.2 MG/ML IJ SOLN
INTRAMUSCULAR | Status: DC | PRN
  Administered 2011-11-25: .6 mg via INTRAVENOUS

## 2011-11-25 MED ORDER — MIDAZOLAM HCL 5 MG/5ML IJ SOLN
INTRAMUSCULAR | Status: DC | PRN
  Administered 2011-11-25: 2 mg via INTRAVENOUS

## 2011-11-25 MED ORDER — SODIUM CHLORIDE 0.9 % IV SOLN: INTRAVENOUS | Status: DC

## 2011-11-25 MED ORDER — SODIUM CHLORIDE 0.9 % IR SOLN
Status: DC | PRN
  Administered 2011-11-25: 08:00:00

## 2011-11-25 MED ORDER — PHENOL 1.4 % MT LIQD: 1.0000 | OROMUCOSAL | Status: DC | PRN

## 2011-11-25 MED ORDER — VECURONIUM BROMIDE 10 MG IV SOLR
INTRAVENOUS | Status: DC | PRN
  Administered 2011-11-25: 5 mg via INTRAVENOUS
  Administered 2011-11-25: 1 mg via INTRAVENOUS
  Administered 2011-11-25: 5 mg via INTRAVENOUS
  Administered 2011-11-25: 2 mg via INTRAVENOUS
  Administered 2011-11-25 (×3): 5 mg via INTRAVENOUS
  Administered 2011-11-25: 3 mg via INTRAVENOUS
  Administered 2011-11-25: 7 mg via INTRAVENOUS
  Administered 2011-11-25: 1 mg via INTRAVENOUS
  Administered 2011-11-25: 5 mg via INTRAVENOUS

## 2011-11-25 MED ORDER — MORPHINE SULFATE (PF) 1 MG/ML IV SOLN
INTRAVENOUS | Status: DC
  Administered 2011-11-25: 19:00:00 via INTRAVENOUS
  Administered 2011-11-25: 12.6 mg via INTRAVENOUS
  Administered 2011-11-25: 23:00:00 via INTRAVENOUS
  Administered 2011-11-25: 15 mg via INTRAVENOUS
  Administered 2011-11-25: 13:00:00 via INTRAVENOUS
  Filled 2011-11-25 (×3): qty 25

## 2011-11-25 MED ORDER — ACETAMINOPHEN 325 MG PO TABS
325.0000 mg | ORAL_TABLET | ORAL | Status: DC | PRN
  Administered 2011-12-02: 650 mg via ORAL
  Filled 2011-11-25: qty 2

## 2011-11-25 MED ORDER — DEXTROSE 5 % IV SOLN: 1.5000 g | Freq: Two times a day (BID) | INTRAVENOUS | Status: DC

## 2011-11-25 MED ORDER — HEPARIN SODIUM (PORCINE) 1000 UNIT/ML IJ SOLN
INTRAMUSCULAR | Status: DC | PRN
  Administered 2011-11-25: 7000 [IU] via INTRAVENOUS
  Administered 2011-11-25: 2000 [IU] via INTRAVENOUS

## 2011-11-25 MED ORDER — SODIUM CHLORIDE 0.9 % IV SOLN: 500.0000 mL | Freq: Once | INTRAVENOUS | Status: AC | PRN

## 2011-11-25 MED ORDER — VANCOMYCIN HCL 1000 MG IV SOLR
2000.0000 mg | Freq: Once | INTRAVENOUS | Status: AC
  Administered 2011-11-25: 2000 mg via INTRAVENOUS
  Filled 2011-11-25 (×2): qty 2000

## 2011-11-25 MED ORDER — EPHEDRINE SULFATE 50 MG/ML IJ SOLN
INTRAMUSCULAR | Status: DC | PRN
  Administered 2011-11-25 (×2): 5 mg via INTRAVENOUS

## 2011-11-25 MED ORDER — CHLORHEXIDINE GLUCONATE 0.12 % MT SOLN
15.0000 mL | Freq: Two times a day (BID) | OROMUCOSAL | Status: DC
  Administered 2011-11-25 – 2011-12-02 (×12): 15 mL via OROMUCOSAL
  Filled 2011-11-25 (×20): qty 15

## 2011-11-25 MED ORDER — INSULIN ASPART 100 UNIT/ML ~~LOC~~ SOLN
0.0000 [IU] | SUBCUTANEOUS | Status: DC
  Administered 2011-11-25: 3 [IU] via SUBCUTANEOUS
  Administered 2011-11-26 – 2011-11-28 (×8): 2 [IU] via SUBCUTANEOUS
  Administered 2011-11-29 (×3): 3 [IU] via SUBCUTANEOUS
  Administered 2011-11-30: 2 [IU] via SUBCUTANEOUS
  Administered 2011-11-30: 3 [IU] via SUBCUTANEOUS
  Filled 2011-11-25: qty 3

## 2011-11-25 MED ORDER — PROTAMINE SULFATE 10 MG/ML IV SOLN
INTRAVENOUS | Status: DC | PRN
  Administered 2011-11-25: 50 mg via INTRAVENOUS

## 2011-11-25 MED ORDER — DIPHENHYDRAMINE HCL 50 MG/ML IJ SOLN
12.5000 mg | Freq: Four times a day (QID) | INTRAMUSCULAR | Status: DC | PRN
  Administered 2011-11-27: 12.5 mg via INTRAVENOUS
  Filled 2011-11-25: qty 1

## 2011-11-25 MED ORDER — MORPHINE SULFATE 10 MG/ML IJ SOLN
INTRAMUSCULAR | Status: DC | PRN
  Administered 2011-11-25 (×2): 5 mg via INTRAVENOUS

## 2011-11-25 MED ORDER — PHENYLEPHRINE HCL 10 MG/ML IJ SOLN
10.0000 mg | INTRAVENOUS | Status: DC | PRN
  Administered 2011-11-25: 5 ug/min via INTRAVENOUS

## 2011-11-25 MED ORDER — POTASSIUM CHLORIDE CRYS ER 20 MEQ PO TBCR: 20.0000 meq | EXTENDED_RELEASE_TABLET | Freq: Once | ORAL | Status: AC | PRN

## 2011-11-25 MED ORDER — HYDRALAZINE HCL 20 MG/ML IJ SOLN
10.0000 mg | INTRAMUSCULAR | Status: DC | PRN
  Filled 2011-11-25: qty 0.5

## 2011-11-25 MED ORDER — LABETALOL HCL 5 MG/ML IV SOLN
10.0000 mg | INTRAVENOUS | Status: DC | PRN
  Filled 2011-11-25: qty 4

## 2011-11-25 MED ORDER — ACETAMINOPHEN 650 MG RE SUPP: 325.0000 mg | RECTAL | Status: DC | PRN

## 2011-11-25 MED ORDER — ENOXAPARIN SODIUM 40 MG/0.4ML ~~LOC~~ SOLN
40.0000 mg | SUBCUTANEOUS | Status: DC
  Administered 2011-11-27 – 2011-12-03 (×6): 40 mg via SUBCUTANEOUS
  Filled 2011-11-25 (×8): qty 0.4

## 2011-11-25 MED ORDER — MUPIROCIN 2 % EX OINT
TOPICAL_OINTMENT | CUTANEOUS | Status: AC
  Filled 2011-11-25: qty 22

## 2011-11-25 MED ORDER — DROPERIDOL 2.5 MG/ML IJ SOLN: 0.6250 mg | INTRAMUSCULAR | Status: DC | PRN

## 2011-11-25 SURGICAL SUPPLY — 63 items
BENZOIN TINCTURE PRP APPL 2/3 (GAUZE/BANDAGES/DRESSINGS) ×4 IMPLANT
CANISTER SUCTION 2500CC (MISCELLANEOUS) ×2 IMPLANT
CANNULA VESSEL W/WING WO/VALVE (CANNULA) ×2 IMPLANT
CLIP LIGATING EXTRA MED SLVR (CLIP) ×2 IMPLANT
CLIP LIGATING EXTRA SM BLUE (MISCELLANEOUS) ×2 IMPLANT
CLOTH BEACON ORANGE TIMEOUT ST (SAFETY) ×2 IMPLANT
COVER SURGICAL LIGHT HANDLE (MISCELLANEOUS) ×4 IMPLANT
COVER TABLE BACK 60X90 (DRAPES) ×2 IMPLANT
DRAPE BILATERAL SPLIT (DRAPES) IMPLANT
DRAPE CV SPLIT W-CLR ANES SCRN (DRAPES) IMPLANT
DRAPE WARM FLUID 44X44 (DRAPE) ×2 IMPLANT
DRSG COVADERM 4X14 (GAUZE/BANDAGES/DRESSINGS) ×4 IMPLANT
ELECT BLADE 4.0 EZ CLEAN MEGAD (MISCELLANEOUS) ×2
ELECT REM PT RETURN 9FT ADLT (ELECTROSURGICAL) ×2
ELECTRODE BLDE 4.0 EZ CLN MEGD (MISCELLANEOUS) ×1 IMPLANT
ELECTRODE REM PT RTRN 9FT ADLT (ELECTROSURGICAL) ×1 IMPLANT
GAUZE SPONGE 4X4 12PLY STRL LF (GAUZE/BANDAGES/DRESSINGS) ×2 IMPLANT
GLOVE BIO SURGEON STRL SZ 6.5 (GLOVE) ×4 IMPLANT
GLOVE BIOGEL PI IND STRL 6.5 (GLOVE) ×1 IMPLANT
GLOVE BIOGEL PI IND STRL 7.0 (GLOVE) ×5 IMPLANT
GLOVE BIOGEL PI IND STRL 7.5 (GLOVE) ×3 IMPLANT
GLOVE BIOGEL PI INDICATOR 6.5 (GLOVE) ×1
GLOVE BIOGEL PI INDICATOR 7.0 (GLOVE) ×5
GLOVE BIOGEL PI INDICATOR 7.5 (GLOVE) ×3
GLOVE SS BIOGEL STRL SZ 6.5 (GLOVE) ×2 IMPLANT
GLOVE SS BIOGEL STRL SZ 7.5 (GLOVE) ×1 IMPLANT
GLOVE SUPERSENSE BIOGEL SZ 6.5 (GLOVE) ×2
GLOVE SUPERSENSE BIOGEL SZ 7.5 (GLOVE) ×1
GLOVE SURG SS PI 7.5 STRL IVOR (GLOVE) ×6 IMPLANT
GOWN PREVENTION PLUS XLARGE (GOWN DISPOSABLE) ×6 IMPLANT
GOWN STRL NON-REIN LRG LVL3 (GOWN DISPOSABLE) ×10 IMPLANT
GRAFT HEMASHIELD 16X8MM (Vascular Products) ×2 IMPLANT
INSERT FOGARTY 61MM (MISCELLANEOUS) ×2 IMPLANT
INSERT FOGARTY SM (MISCELLANEOUS) ×4 IMPLANT
KIT BASIN OR (CUSTOM PROCEDURE TRAY) ×2 IMPLANT
KIT ROOM TURNOVER OR (KITS) ×2 IMPLANT
NS IRRIG 1000ML POUR BTL (IV SOLUTION) ×4 IMPLANT
PACK AORTA (CUSTOM PROCEDURE TRAY) ×2 IMPLANT
PAD ARMBOARD 7.5X6 YLW CONV (MISCELLANEOUS) ×4 IMPLANT
SPECIMEN JAR MEDIUM (MISCELLANEOUS) ×2 IMPLANT
SPONGE LAP 18X18 X RAY DECT (DISPOSABLE) IMPLANT
STAPLER VISISTAT 35W (STAPLE) ×4 IMPLANT
STRIP CLOSURE SKIN 1/2X4 (GAUZE/BANDAGES/DRESSINGS) ×6 IMPLANT
SUT ETHIBOND 5 LR DA (SUTURE) IMPLANT
SUT PDS AB 1 TP1 54 (SUTURE) ×4 IMPLANT
SUT PROLENE 3 0 SH1 36 (SUTURE) ×4 IMPLANT
SUT PROLENE 5 0 C 1 24 (SUTURE) ×10 IMPLANT
SUT PROLENE 5 0 C 1 36 (SUTURE) IMPLANT
SUT SILK 2 0 SH CR/8 (SUTURE) ×2 IMPLANT
SUT SILK 2 0 TIES 17X18 (SUTURE) ×1
SUT SILK 2-0 18XBRD TIE BLK (SUTURE) ×1 IMPLANT
SUT SILK 3 0 TIES 17X18 (SUTURE) ×1
SUT SILK 3-0 18XBRD TIE BLK (SUTURE) ×1 IMPLANT
SUT VIC AB 2-0 CT1 27 (SUTURE) ×1
SUT VIC AB 2-0 CT1 36 (SUTURE) ×4 IMPLANT
SUT VIC AB 2-0 CT1 TAPERPNT 27 (SUTURE) ×1 IMPLANT
SUT VIC AB 3-0 SH 27 (SUTURE) ×4
SUT VIC AB 3-0 SH 27X BRD (SUTURE) ×4 IMPLANT
TOWEL BLUE STERILE X RAY DET (MISCELLANEOUS) ×4 IMPLANT
TOWEL OR 17X24 6PK STRL BLUE (TOWEL DISPOSABLE) ×4 IMPLANT
TOWEL OR 17X26 10 PK STRL BLUE (TOWEL DISPOSABLE) ×4 IMPLANT
TRAY FOLEY CATH 14FRSI W/METER (CATHETERS) ×2 IMPLANT
WATER STERILE IRR 1000ML POUR (IV SOLUTION) ×4 IMPLANT

## 2011-11-25 NOTE — Progress Notes (Signed)
Pharmacy - Dilantin  On Dilantin prior to admission for seizure disorder Presents today for bypass graft for PVD  Dose PTA was Dilantin po 200 in AM, 300 mg in PM  Plan: 1) Continue current regimen of Dilantin 250 mg IV q 12 hours 2) Change back to po home dose as soon as possible  Thank you.  Okey Regal, PharmD [phone]

## 2011-11-25 NOTE — Anesthesia Preprocedure Evaluation (Addendum)
Anesthesia Evaluation  Patient identified by MRN, date of birth, ID band Patient awake    Reviewed: Allergy & Precautions, H&P , NPO status , Patient's Chart, lab work & pertinent test results  History of Anesthesia Complications Negative for: history of anesthetic complications  Airway Mallampati: II      Dental   Pulmonary Current Smoker,  clear to auscultation  Pulmonary exam normal       Cardiovascular hypertension, + CAD and + Past MI Regular Normal- Systolic murmurs    Neuro/Psych Seizures -, Well Controlled,     GI/Hepatic negative GI ROS, Neg liver ROS,   Endo/Other  Diabetes mellitus-, Oral Hypoglycemic Agents  Renal/GU negative Renal ROS     Musculoskeletal   Abdominal   Peds  Hematology   Anesthesia Other Findings   Reproductive/Obstetrics                           Anesthesia Physical Anesthesia Plan  ASA: III  Anesthesia Plan: General   Post-op Pain Management:    Induction: Intravenous  Airway Management Planned: Oral ETT  Additional Equipment: Arterial line and PA Cath  Intra-op Plan:   Post-operative Plan: Possible Post-op intubation/ventilation  Informed Consent: I have reviewed the patients History and Physical, chart, labs and discussed the procedure including the risks, benefits and alternatives for the proposed anesthesia with the patient or authorized representative who has indicated his/her understanding and acceptance.   Dental advisory given  Plan Discussed with: CRNA, Anesthesiologist and Surgeon  Anesthesia Plan Comments:         Anesthesia Quick Evaluation

## 2011-11-25 NOTE — Transfer of Care (Signed)
Immediate Anesthesia Transfer of Care Note  Patient: [name] [name]  Procedure(s) Performed:  AORTA BIFEMORAL BYPASS GRAFT  Patient Location: PACU  Anesthesia Type: General  Level of Consciousness: awake and patient cooperative  Airway & Oxygen Therapy: Patient Spontanous Breathing and Patient connected to face mask oxygen  Post-op Assessment: Report given to PACU RN and Post -op Vital signs reviewed and stable  Post vital signs: Reviewed  Complications: No apparent anesthesia complications

## 2011-11-25 NOTE — Anesthesia Postprocedure Evaluation (Signed)
  Anesthesia Post-op Note  Patient: [name] [name]  Procedure(s) Performed:  AORTA BIFEMORAL BYPASS GRAFT  Patient Location: PACU  Anesthesia Type: General  Level of Consciousness: awake  Airway and Oxygen Therapy: Patient Spontanous Breathing  Post-op Pain: mild  Post-op Assessment: Post-op Vital signs reviewed  Post-op Vital Signs: stable  Complications: No apparent anesthesia complications

## 2011-11-25 NOTE — H&P (View-Only) (Signed)
[facility] [place] History and Physical    Patient name: [name] [name] MRN: [id number] DOB: 08/23/1951 Sex: male       HISTORY OF PRESENT ILLNESS: The patient presents today for further discussion of his right leg ischemia. He has a complex past history. He initially had right leg severely limiting claudication and underwent left iliac angioplasty and a left to right fem-fem bypass. This was in March of 2011. He presented with occlusion of his graft in May of 2012 and underwent thrombectomy of this graft and then re\re angioplasty of his left iliac vessels. He presented in mid-December with recurrent symptoms and a recurrent occlusion of his fem-fem bypass. He underwent arteriography on 11/11/2011 confirming occlusion of his right common iliac artery and his left to right fem-fem bypass. 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 He also has bilateral superficial artery occlusions. He has had continued total leg claudication on the right and rest pain in his right foot. He has no tissue loss currently.  Past Medical History  Diagnosis Date  . Diabetes mellitus   . Hyperlipidemia   . Hypertension   . Depression with anxiety   . Arthritis   . Leg pain   . Seizure disorder     Past Surgical History  Procedure Date  . Thrombectomy     left to right fem-fem bypass  . Hernia repair   . Aortogram with iliac artery stenting 05/06/2011    History   Social History  . Marital Status: Single    Spouse Name: N/A    Number of Children: N/A  . Years of Education: N/A   Occupational History  . Not on file.   Social History Main Topics  . Smoking status: Current Everyday Smoker -- 1.5 packs/day for 42 years    Types: Cigarettes  . Smokeless tobacco: Not on file  . Alcohol Use: No     alcohol abuse in the past  . Drug Use: No  . Sexually Active: Not on file   Other Topics Concern  . Not on file   Social History Narrative  . No narrative  on file    Family History  Problem Relation Age of Onset  . Hypertension Mother   . Heart attack Mother   . Diabetes Mother   . Aneurysm Father   . Heart attack Sister   . Heart disease Brother     heart transplant    Allergies  Allergen Reactions  . Penicillins Swelling  . Zetia (Ezetimibe)     Aches all over, short of breath    Prior to Admission medications   Medication Sig Start Date End Date Taking? Authorizing Provider  aspirin 81 MG tablet Take 81 mg by mouth daily.     Yes Historical Provider, MD  glipiZIDE (GLUCOTROL) 10 MG tablet Take 10 mg by mouth 2 (two) times daily before a meal.     Yes Historical Provider, MD  METFORMIN HCL PO Take 500 mg by mouth 2 (two) times daily.    Yes Historical Provider, MD  oxyCODONE (ROXICODONE) 5 MG immediate release tablet Take 1 tablet (5 mg total) by mouth every 4 (four) hours as needed for pain. 11/13/11 11/23/11 Yes Samantha Ellington, PA  phenytoin (DILANTIN) 100 MG ER capsule Take 300 mg by mouth 2 (two) times daily. 200 mg in the morning. 300 mg in the evening.   Yes Historical Provider, MD  Turmeric, [place], POWD Take 1   tablet by mouth daily.    Yes Historical Provider, MD     REVIEW OF SYSTEMS: Cardiovascular: No chest pain, chest pressure, palpitations, orthopnea, or dyspnea on exertion. No claudication or rest pain,  No history of DVT or phlebitis. Pulmonary: No productive cough, asthma or wheezing. Neurologic: No weakness, paresthesias, aphasia, or amaurosis. No dizziness. Hematologic: No bleeding problems or clotting disorders. Musculoskeletal: No joint pain or joint swelling. Gastrointestinal: No blood in stool or hematemesis Genitourinary: No dysuria or hematuria. Psychiatric:: No history of major depression. Integumentary: No rashes or ulcers. Constitutional: No fever or chills.  PHYSICAL EXAMINATION:  Filed Vitals:   11/21/11 1151  BP: 169/93  Pulse: 108  Resp: 20    General: The patient appears  their stated age. Pulmonary: There is a good air exchange bilaterally without wheezing or rales. Abdomen: Soft and non-tender with normal pitch bowel sounds. Musculoskeletal: There are no major deformities.   Neurologic: No focal weakness or paresthesias are detected, Skin: There are no ulcer or rashes noted. Psychiatric: The patient has normal affect. Cardiovascular: There is a regular rate and rhythm without significant murmur appreciated. He does have palpable left femoral pulse and absent right femoral pulse. 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  Plan: I discussed options of observation which is not possible due to his rest pain, thrombectomy of his left to right fem-fem bypass, or aortobifemoral bypass. I have recommended aortofemoral bypass due to his episodes of recurrent occlusion of his fem-fem bypass in his known iliac occlusive disease. He understands the magnitude the procedure. I explained less than 5% chance of mortality and and 15% chance of complication prolonged hospitalization. Also explained the mom recovery after this magnitude of the operation. He was cleared for surgery from a cardiac standpoint during his recent admission to the hospital. Surgery is scheduled for 11/25/2011  Brendin Situ F 12/27/20121:21 PM  

## 2011-11-25 NOTE — Preoperative (Signed)
Beta Blockers   Reason not to administer Beta Blockers:Not Applicable, not currently taken beta blocker

## 2011-11-25 NOTE — Op Note (Signed)
OPERATIVE REPORT  DATE OF SURGERY: 11/25/2011  PATIENT: [name] [name], 60 y.o. male MRN: [id number]  DOB: Dec 20, 1950  PRE-OPERATIVE DIAGNOSIS: Aortoiliac occlusive disease  POST-OPERATIVE DIAGNOSIS:  Same  PROCEDURE: Aortobifemoral bypass with 16 x 8 Hemashield graft, right external iliac endarterectomy  SURGEON:  Gretta Began, M.D.   ASSISTANT: Dr. Cari Caraway, Della Goo PA  ANESTHESIA:  Gen. endotracheal  EBL: 200 cc ml  Total I/O In: 5330 [I.V.:4800; NG/GT:30; IV Piggyback:500] Out: 1180 [Urine:880; Blood:300]  BLOOD ADMINISTERED: Cell Saver only  DRAINS: None  SPECIMEN: Right iliac endarterectomy specimen  COUNTS CORRECT:  YES  PLAN OF CARE: Recovery room hemodynamically stable and extubated   PATIENT DISPOSITION:  PACU - hemodynamically stable  PROCEDURE DETAILS: The patient was taken to the operating room placed supine position where the area of the abdomen and both groins were prepped and draped in the usual sterile fashion. She had a prior femoral to femoral bypass. The prior groin incisions were reopened and dissection was continued down to the prior anastomoses bilaterally. Fem-fem bypass was occluded and the right had no pulsation. The common femoral superficial femoral and profunda femoris arteries were isolated bilaterally. There were 2 profunda branches bilaterally. The patient had known chronic superficial femoral artery occlusions. After this was all exposed, attention was turned to the abdomen.  An incision was made from just below the xiphoid to below the umbilicus and carried down to the subcutaneous fat with electrocautery. The midline fascia was opened in line with the skin incision. The Omni-Tract was used for exposure. The transverse colon reflected superiorly and the small bowel was reflected to the right. The duodenum was mobilized off the aorta. The aorta was encircled below the level of the renal arteries and also was controlled above the  inferior mesenteric artery takeoff. Tunnels were created from the aortic bifurcation to the respectable groins. Care was taken to pass behind the level of the ureters bilaterally. The patient was given 25 g of mannitol and 7000 units of intravenous heparin. After adequate circulation time the aorta was occluded below the level renal arteries and above the level intermesenteric artery. The aorta was transected below the level of the proximal clamp and a segment of aorta was resected. The distal aorta was oversewn with 2 layers of 3-0 Prolene suture. A 16 x 8 Hemashield graft onto the field it was cut to appropriate size. Using a felt strip for reinforcement and a proximal end-to-end anastomosis was accomplished. This was tested and found to be adequate. The graft limbs were flushed with heparinized saline and reoccluded. A tunnel had been created were used to tunnel the limbs of the graft to the respectable groins. The right iliac artery superficial femoral was restarted occluded. The old fem-fem graft was divided at femoral anastomosis. The common femoral artery was chronically occluded with no inflow from the iliac and no flow into the superficial femoral profundus femoris arteries. The external iliac was bluntly endarterectomized a large roughly 4 cm length segment of the iliac plaque was removed and there was backbleeding from this. As was reoccluded. The superficial femoral artery was chronically occluded and therefore was ligated and divided. The common femoral artery was endarterectomized and into the 2 profunda branches getting good backbleeding from the profundus femoris arteries. The limb was spatulated and sewn end-to-side to the junction of the common femoral and down onto the deep femoral arteries on the right. Additional sutures were required for hemostasis and closure of the right leg. Excellent Doppler flow  was noted in the profundus or murmurs artery on the right.   Next attention was turned to the  left groin. The common superficial femoral arteries were occluded. The hood of the graft was transected. There was some dilatation of the graft but no roll false aneurysm and no true aneurysm in the common femoral artery. The old graft was removed in its entirety. The superficial artery was ligated and divided since it was chronically occluded. The left limb of the graft was isolated and sewn end-to-side to the junction of the common femoral artery onto the profundus femoral artery. After completion anastomosis flow Greggory Stallion of the left leg. Again good Doppler flow was noted and the profundus branches. The patient was given 50 mg of protamine to reverse the heparin. The old nonfunctional fem-fem bypass was removed in its entirety to the groin incisions. Hemostasis electrocautery. The small bowel was run some guarding be without injury the retroperitoneum was closed to exclude the bowel from the graft with a running 2-0 Vicryl suture. Small bowel returned to the pelvis and the transverse colon that were placed over this. The midline fascia was closed with #1 PDS suture beginning proximally and distally and tying in the middle. Skin was closed with 3-0 subcuticular Vicryl stitch. The groins were closed in several layers of 2-0 Vicryl suture and the skin was closed with 3-0 subcuticular Vicryl stitch. Sterile dressing was applied and the patient was taken to the recovery room extubated in stable condition   Gretta Began, M.D. 11/25/2011 3:32 PM

## 2011-11-25 NOTE — Interval H&P Note (Signed)
History and Physical Interval Note:  11/25/2011 7:21 AM  [name] [name]  has presented today for surgery, with the diagnosis of PVD  The various methods of treatment have been discussed with the patient and family. After consideration of risks, benefits and other options for treatment, the patient has consented to  Procedure(s): AORTA BIFEMORAL BYPASS GRAFT as a surgical intervention .  The patients' history has been reviewed, patient examined, no change in status, stable for surgery.  I have reviewed the patients' chart and labs.  Questions were answered to the patient's satisfaction.     Larina Earthly

## 2011-11-26 ENCOUNTER — Inpatient Hospital Stay (HOSPITAL_COMMUNITY): Payer: Medicaid Other

## 2011-11-26 LAB — COMPREHENSIVE METABOLIC PANEL
ALT: 13 U/L (ref 0–53)
Alkaline Phosphatase: 58 U/L (ref 39–117)
BUN: 6 mg/dL (ref 6–23)
CO2: 27 mEq/L (ref 19–32)
Calcium: 8.1 mg/dL — ABNORMAL LOW (ref 8.4–10.5)
GFR calc Af Amer: >90 mL/min (ref >90)
GFR calc non Af Amer: >90 mL/min (ref >90)
Glucose, Bld: 139 mg/dL — ABNORMAL HIGH (ref 70–99)
Sodium: 135 mEq/L (ref 135–145)

## 2011-11-26 LAB — CBC
HCT: 35.7 % — ABNORMAL LOW (ref 39.0–52.0)
Hemoglobin: 11.7 g/dL — ABNORMAL LOW (ref 13.0–17.0)
MCH: 30.8 pg (ref 26.0–34.0)
MCV: 93.9 fL (ref 78.0–100.0)
RBC: 3.8 MIL/uL — ABNORMAL LOW (ref 4.22–5.81)

## 2011-11-26 LAB — GLUCOSE, CAPILLARY
Glucose-Capillary: 112 mg/dL — ABNORMAL HIGH (ref 70–99)
Glucose-Capillary: 135 mg/dL — ABNORMAL HIGH (ref 70–99)
Glucose-Capillary: 104 mg/dL — ABNORMAL HIGH (ref 70–99)
Glucose-Capillary: 145 mg/dL — ABNORMAL HIGH (ref 70–99)

## 2011-11-26 LAB — HEMOGLOBIN A1C: Hgb A1c MFr Bld: 6.3 % — ABNORMAL HIGH (ref <5.7)

## 2011-11-26 LAB — MAGNESIUM: Magnesium: 1.7 mg/dL (ref 1.5–2.5)

## 2011-11-26 MED ORDER — DIPHENHYDRAMINE HCL 50 MG/ML IJ SOLN: 12.5000 mg | Freq: Four times a day (QID) | INTRAMUSCULAR | Status: DC | PRN

## 2011-11-26 MED ORDER — ONDANSETRON HCL 4 MG/2ML IJ SOLN: 4.0000 mg | Freq: Four times a day (QID) | INTRAMUSCULAR | Status: DC | PRN

## 2011-11-26 MED ORDER — NALOXONE HCL 0.4 MG/ML IJ SOLN: 0.4000 mg | INTRAMUSCULAR | Status: DC | PRN

## 2011-11-26 MED ORDER — SODIUM CHLORIDE 0.9 % IJ SOLN: 9.0000 mL | INTRAMUSCULAR | Status: DC | PRN

## 2011-11-26 MED ORDER — DIPHENHYDRAMINE HCL 12.5 MG/5ML PO ELIX: 12.5000 mg | ORAL_SOLUTION | Freq: Four times a day (QID) | ORAL | Status: DC | PRN

## 2011-11-26 MED ORDER — HYDROMORPHONE 0.3 MG/ML IV SOLN: INTRAVENOUS | Status: DC

## 2011-11-26 MED ORDER — HYDROMORPHONE 0.3 MG/ML IV SOLN
INTRAVENOUS | Status: DC
  Administered 2011-11-26 (×2): via INTRAVENOUS
  Administered 2011-11-26: 2.1 mg via INTRAVENOUS
  Administered 2011-11-26: 1.2 mg via INTRAVENOUS
  Administered 2011-11-26: 5.3 mg via INTRAVENOUS
  Administered 2011-11-26: 19:00:00 via INTRAVENOUS
  Administered 2011-11-26: 3.9 mg via INTRAVENOUS
  Administered 2011-11-26: 2.7 mg via INTRAVENOUS
  Administered 2011-11-27: 1.2 mg via INTRAVENOUS
  Administered 2011-11-27: 1.7 mg via INTRAVENOUS
  Administered 2011-11-27: 3 mg via INTRAVENOUS
  Administered 2011-11-27: 2.7 mg via INTRAVENOUS
  Administered 2011-11-27: 2.4 mg via INTRAVENOUS
  Administered 2011-11-27: 1.8 mg via INTRAVENOUS
  Administered 2011-11-27: 1.2 mg via INTRAVENOUS
  Administered 2011-11-28: 1.5 mg via INTRAVENOUS
  Administered 2011-11-28: 2.4 mg via INTRAVENOUS
  Administered 2011-11-28: 1.14 mg via INTRAVENOUS
  Administered 2011-11-28: 21:00:00 via INTRAVENOUS
  Administered 2011-11-28: 1.79 mg via INTRAVENOUS
  Administered 2011-11-28: 1.8 mg via INTRAVENOUS
  Administered 2011-11-29: 0.9 mg via INTRAVENOUS
  Administered 2011-11-29: 25 mL via INTRAVENOUS
  Administered 2011-11-29 (×2): 1.8 mg via INTRAVENOUS
  Administered 2011-11-30 – 2011-12-01 (×2): via INTRAVENOUS
  Administered 2011-12-01: 0.946 mg via INTRAVENOUS
  Administered 2011-12-01: 1.2 mg via INTRAVENOUS
  Administered 2011-12-01: 18:00:00 via INTRAVENOUS
  Administered 2011-12-01: 0.9 mg via INTRAVENOUS
  Administered 2011-12-02: 08:00:00 via INTRAVENOUS
  Administered 2011-12-02: 3.3 mg via INTRAVENOUS
  Filled 2011-11-26 (×12): qty 25

## 2011-11-26 NOTE — Plan of Care (Signed)
Problem: Phase II Progression Outcomes Goal: Tolerates clear liquids without nausea/vomiting Outcome: Not Progressing Dr Arbie Cookey wants  To leave  NG in additional day 2 to increased drainage Goal: Pain controlled Outcome: Progressing Changed to full dose PCA Dilaudid Goal: Progress activity as tolerated unless otherwise ordered Outcome: Progressing OOB

## 2011-11-26 NOTE — Progress Notes (Signed)
Subjective: Interval History: has complaints abd pain..   Objective: Vital signs in last 24 hours: Temp:  [98.1 F (36.7 C)-99.9 F (37.7 C)] 99.6 F (37.6 C) (01/01 0716) Pulse Rate:  [65-85] 84  (01/01 0800) Resp:  [8-18] 17  (01/01 0800) BP: (130-162)/(70-96) 146/79 mmHg (01/01 0800) SpO2:  [98 %-100 %] 99 % (01/01 0800) Arterial Line BP: (160-185)/(72-86) 166/78 mmHg (01/01 0800) FiO2 (%):  [2 %] 2 % (01/01 0800)  Intake/Output from previous day: 12/31 0701 - 01/01 0700 In: 7973.6 [I.V.:6528.6; NG/GT:240; IV Piggyback:1205] Out: 3550 [Urine:2850; Emesis/NG output:400; Blood:300] Intake/Output this shift: Total I/O In: 155 [I.V.:125; NG/GT:30] Out: 30 [Urine:30]  General appearance: alert, cooperative and no distress GI: abd soft, mod tenderness Extremities: warm, palp fem pulses  Lab Results:  Basename 11/26/11 0350 11/25/11 1313  WBC 9.4 11.4*  HGB 11.7* 11.8*  HCT 35.7* 35.4*  PLT 136* 153   BMET  Basename 11/26/11 0350 11/25/11 1313  NA 135 135  K 4.1 3.9  CL 103 103  CO2 27 27  GLUCOSE 139* 207*  BUN 6 8  CREATININE 0.51 0.54  CALCIUM 8.1* 8.3*    Studies/Results: Dg Chest 2 View  11/11/2011  *RADIOLOGY REPORT*  Clinical Data: Preoperative cardiopulmonary evaluation.  CHEST - 2 VIEW  Comparison: None.  Findings: Cardiac silhouette is upper range of normal size.  No hilar or mediastinal lesions are seen.  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 No pulmonary edema, pneumonia, or pleural effusion is seen.  There is very minimal degenerative spondylosis compatible with age.  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 No pulmonary edema, pneumonia, or pleural effusion is seen.  Original Report Authenticated By: Crawford Givens, M.D.   Dg Chest Portable 1 View  11/25/2011  *RADIOLOGY REPORT*  Clinical Data: 61 year old male status post Swan placement.  PORTABLE CHEST  - 1 VIEW  Comparison: 11/11/2011.  Findings: Portable semi upright AP view at 1348 hours.  Right IJ approach Swan-Ganz catheter traverses the heart and pulmonary outflow tract and terminates at the right lung base.  This is about 4 cm distal to the level of the right hilum.  Lower lung volumes.  Enteric tube in place courses to the left upper quadrant.  No pneumothorax.  Crowding of lung markings without overt edema.  No pleural effusion or consolidation.  Stable cardiac size and mediastinal contours.  IMPRESSION: 1.  Right IJ approach Swan-Ganz catheter appears wedged at the right lung base.  This is about 4 cm distal to the right main pulmonary artery level. 2.  Lower lung volumes, no other acute cardiopulmonary abnormality.  Original Report Authenticated By: Harley Hallmark, M.D.   Anti-infectives: Anti-infectives     Start     Dose/Rate Route Frequency Ordered Stop   11/25/11 2000   vancomycin (VANCOCIN) 2,000 mg in sodium chloride 0.9 % 500 mL IVPB     Comments: For 1 dose post-op per Rene Kocher Roczhiak      2,000 mg 250 mL/hr over 120 Minutes Intravenous  Once 11/25/11 1516 11/25/11 2305   11/25/11 1515   cefUROXime (ZINACEF) 1.5 g in dextrose 5 % 50 mL IVPB  Status:  Discontinued        1.5 g 100 mL/hr over 30 Minutes Intravenous Every 12 hours 11/25/11 1500 11/25/11 1511   11/25/11 0600   vancomycin (VANCOCIN) IVPB 1000 mg/200 mL premix        1,000 mg 200 mL/hr over 60  Minutes Intravenous On call to O.R. 11/24/11 1411 11/25/11 0723          Assessment/Plan: s/p Procedure(s): AORTA BIFEMORAL BYPASS GRAFT Stable.  Will keep NG with 400cc out, dc alineand sleeve.  OOB   LOS: 1 day   Brithney Bensen F 11/26/2011, 8:29 AM

## 2011-11-26 NOTE — Progress Notes (Signed)
Physical Therapy Evaluation Patient Details Name: [name] [name] MRN: [id number] DOB: [birth year] Today's Date: 11/26/2011  Problem List:  Patient Active Problem List  Diagnoses  . Atherosclerosis of native arteries of the extremities with intermittent claudication  . PVD (peripheral vascular disease) with claudication    Past Medical History:  Past Medical History  Diagnosis Date  . Diabetes mellitus   . Hyperlipidemia   . Hypertension   . Arthritis   . Leg pain   . Seizure disorder   . Myocardial infarction     "silent heart attack by patient"  . Peripheral vascular disease   . Bronchitis   . 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 Chronic kidney disease     hx UTI, and kidney stones  . Seizures     last seizure "years ago"  . Coronary artery disease    Past Surgical History:  Past Surgical History  Procedure Date  . Thrombectomy     left to right fem-fem bypass  . Hernia repair   . Aortogram with iliac artery stenting 05/06/2011  . Tonsillectomy     PT Assessment/Plan/Recommendation PT Assessment Clinical Impression Statement:  Pt live with a roommate so level of assistance available is unclear.  Pt will most likely need to consider some form of rehab prior to returning to home.  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 Pt reports LE atrophy and weakness is chronic in nature.  PT Recommendation/Assessment: Patient will need skilled PT in the acute care venue PT Problem List: Decreased strength;Decreased activity tolerance;Decreased mobility;Decreased coordination;Decreased knowledge of use of DME;Decreased knowledge of precautions;Cardiopulmonary status limiting activity;Pain Barriers to Discharge: Decreased caregiver support;Inaccessible home environment Barriers to Discharge Comments: Amount of caregiver support is unclear.   PT Therapy Diagnosis :  Difficulty walking;Generalized weakness;Acute pain PT Plan PT Frequency: Min 3X/week PT Treatment/Interventions: Gait training;DME instruction;Stair training;Functional mobility training;Therapeutic activities;Therapeutic exercise;Patient/family education PT Recommendation Recommendations for Other Services: OT consult Follow Up Recommendations: Skilled nursing facility;24 hour supervision/assistance (May be CIR candidate) Equipment Recommended: Rolling walker with 5" wheels;3 in 1 bedside comode;Tub/shower bench PT Goals  Acute Rehab PT Goals PT Goal Formulation: With patient Time For Goal Achievement: 2 weeks Pt will Roll Supine to Left Side: with modified independence PT Goal: Rolling Supine to Left Side - Progress: Not met Pt will go Supine/Side to Sit: with modified independence PT Goal: Supine/Side to Sit - Progress: Not met Pt will Sit at [facility] of Bed: with modified independence;6-10 min;with no upper extremity support PT Goal: Sit at Edge Of Bed - Progress: Not met Pt will go Sit to Supine/Side: with supervision;with HOB 0 degrees PT Goal: Sit to Supine/Side - Progress: Not met Pt will go Sit to Stand: with supervision;with HOB 0 degrees PT Goal: Sit to Stand - Progress: Not met Pt will go Stand to Sit: with supervision;with HOB 0 degrees PT Goal: Stand to Sit - Progress: Not met Pt will Transfer Bed to Chair/Chair to Bed: with supervision PT Transfer Goal: Bed to Chair/Chair to Bed - Progress: Not met Pt will Ambulate: with supervision;with least restrictive assistive device;16 - 50 feet PT Goal: Ambulate - Progress: Not met Pt will Go Up / Down Stairs: 3-5 stairs;with least restrictive assistive device;with min assist PT Goal: Up/Down Stairs - Progress: Not met  PT Evaluation Precautions/Restrictions  Precautions Precautions: Fall Precaution Comments: LE weakness  RN reports pt's legs buckle in standing and pt required two person assist to transfer to chair.  Required  Braces or Orthoses: No Restrictions Weight Bearing Restrictions: No Other Position/Activity Restrictions: Abdominal incission and arterial bp monitor. Pt has NG tube on wall suction, NPO,  IV in both arms and Cardiac monitor,  Prior Functioning  Home Living Lives With: Friend(s) Receives Help From: Friend(s) Type of Home: Apartment Home Layout: One level Home Access: Stairs to enter Entrance Stairs-Rails: Left Entrance Stairs-Number of Steps: 2 Bathroom Shower/Tub: Forensic scientist: Standard Bathroom Accessibility: No Home Adaptive Equipment: None Prior Function Level of Independence: Independent with basic ADLs;Independent with homemaking with ambulation;Independent with gait;Independent with transfers Able to Take Stairs?: Yes Driving: Yes Vocation: On disability Leisure: Hobbies-no Cognition Cognition Arousal/Alertness: Awake/alert Overall Cognitive Status: Appears within functional limits for tasks assessed Orientation Level: Oriented X4 Sensation/Coordination Sensation Light Touch: Appears Intact Stereognosis: Not tested Hot/Cold: Not tested Proprioception: Impaired by gross assessment Coordination Gross Motor Movements are Fluid and Coordinated: No Fine Motor Movements are Fluid and Coordinated: Not tested Coordination and Movement Description: pt unable to actively move bilater lower leg or foot.   Extremity Assessment RUE Assessment RUE Assessment: Within Functional Limits LUE Assessment LUE Assessment: Within Functional Limits RLE Assessment RLE Assessment: Exceptions to [facility] Suite RLE Strength RLE Overall Strength: Deficits RLE Overall Strength Comments: hip strength 3/5 grossly  Right Knee Flexion: 2-/5 Right Knee Extension: 1/5 Right Ankle Dorsiflexion: 1/5 Right Ankle Plantar Flexion: 1/5 LLE Assessment LLE Assessment: Exceptions to WFL LLE Strength LLE Overall Strength: Deficits LLE Overall Strength Comments: Hip strength 3-/5  grossly Left Knee Flexion: 2-/5 Left Knee Extension: 1/5 Left Ankle Dorsiflexion: 1/5 Left Ankle Plantar Flexion: 1/5 Mobility (including Balance) Bed Mobility Bed Mobility: Yes Sit to Supine - Right: 1: +1 Total assist;HOB flat Sit to Supine - Right Details (indicate cue type and reason): Verbal and tactile cues to lean to Rt elbow then shoulder.  Total assist to manage bil LE due to weakness.  Transfers Transfers: Yes Sit to Stand: 3: Mod assist;With upper extremity assist;With armrests;From chair/3-in-1;Patient percentage (comment) (pt 70%) Sit to Stand Details (indicate cue type and reason): cues for technique.  Assist for anterior transition. BIl LE uncontrolled fall into terminal extension for stability in standing.   Stand to Sit: 3: Mod assist;To elevated surface;To bed;With upper extremity assist (pt 70%) Stand to Sit Details: Cues for technique and assist for controlled descent to bed.  Pt fearful of "busting his stiches"  Stand Pivot Transfers: 3: Mod assist (Pt 70%) Stand Pivot Transfer Details (indicate cue type and reason): Verbal cues for sequencing, Assist to manage RW, pt stable on his feet .  No knee buckle noted pt able to pick up bil LE to step over to bed and in fact llifted bilateral LE high enough to clear monitor cords near his feet.   Ambulation/Gait Ambulation/Gait: No Stairs: No Wheelchair Mobility Wheelchair Mobility: No  Posture/Postural Control Posture/Postural Control: No significant limitations Balance Balance Assessed: No Exercise    End of Session PT - End of Session Equipment Utilized During Treatment: Gait belt Activity Tolerance: Patient limited by fatigue;Patient limited by pain Patient left: in bed;with call bell in reach Nurse Communication: Mobility status for transfers General Behavior During Session: [facility] for tasks performed Cognition: [facility] for tasks performed  [name] [name] 11/26/2011, 2:32 PM [name] [name] DPT [phone]

## 2011-11-27 ENCOUNTER — Encounter (HOSPITAL_COMMUNITY): Payer: Self-pay | Admitting: Vascular Surgery

## 2011-11-27 DIAGNOSIS — I739 Peripheral vascular disease, unspecified: Secondary | ICD-10-CM

## 2011-11-27 LAB — GLUCOSE, CAPILLARY
Glucose-Capillary: 109 mg/dL — ABNORMAL HIGH (ref 70–99)
Glucose-Capillary: 131 mg/dL — ABNORMAL HIGH (ref 70–99)

## 2011-11-27 MED ORDER — BISACODYL 10 MG RE SUPP
10.0000 mg | Freq: Every day | RECTAL | Status: DC | PRN
  Administered 2011-11-27 – 2011-12-03 (×2): 10 mg via RECTAL
  Filled 2011-11-27 (×2): qty 1

## 2011-11-27 NOTE — Progress Notes (Addendum)
[facility] [place]  Post-op  Aortobifem bypass  Date of Surgery: 11/25/2011 Surgeon: Surgeon(s): Larina Earthly, MD Chuck Hint, MD POD: 2 Days Post-Op Procedure(s): AORTA BIFEMORAL BYPASS GRAFT  History of Present Illness  [name] [name] is a 61 y.o. male who is  up s/p Procedure(s): AORTA BIFEMORAL BYPASS GRAFT Pt is doing well. complains of incisional pain; denies nausea/vomiting; denies diarrhea. has not had flatus;has not had BM    Significant Diagnostic Studies: CBC    Component Value Date/Time   WBC 9.4 11/26/2011 0350   WBC 5.9 07/28/2009 1533   RBC 3.80* 11/26/2011 0350   RBC 3.98* 07/28/2009 1533   HGB 11.7* 11/26/2011 0350   HGB 12.5* 07/28/2009 1533   HCT 35.7* 11/26/2011 0350   HCT 37.1* 07/28/2009 1533   PLT 136* 11/26/2011 0350   PLT 145 07/28/2009 1533   MCV 93.9 11/26/2011 0350   MCV 93.3 07/28/2009 1533   MCH 30.8 11/26/2011 0350   MCH 31.3 07/28/2009 1533   MCHC 32.8 11/26/2011 0350   MCHC 33.6 07/28/2009 1533   RDW 13.8 11/26/2011 0350   RDW 14.7* 07/28/2009 1533   LYMPHSABS 2.4 11/25/2011 0640   LYMPHSABS 2.4 07/28/2009 1533   MONOABS 0.7 11/25/2011 0640   MONOABS 0.4 07/28/2009 1533   EOSABS 0.2 11/25/2011 0640   EOSABS 0.2 07/28/2009 1533   BASOSABS 0.1 11/25/2011 0640   BASOSABS 0.1 07/28/2009 1533    BMET    Component Value Date/Time   NA 135 11/26/2011 0350   K 4.1 11/26/2011 0350   CL 103 11/26/2011 0350   CO2 27 11/26/2011 0350   GLUCOSE 139* 11/26/2011 0350   BUN 6 11/26/2011 0350   CREATININE 0.51 11/26/2011 0350   CALCIUM 8.1* 11/26/2011 0350   GFRNONAA >90 11/26/2011 0350   GFRAA >90 11/26/2011 0350   I/O last 3 completed shifts: In: 5349.8 [I.V.:4104.8; NG/GT:390; IV Piggyback:855] Out: 4180 [Urine:3280; Emesis/NG output:900]    Physical Examination BP Readings from Last 3 Encounters:  11/27/11 130/68  11/27/11 130/68  11/21/11 169/93   Temp Readings from Last 3 Encounters:  11/27/11 98.8 F (37.1 C) Oral  11/27/11 98.8 F (37.1 C)  Oral  11/13/11 97.8 F (36.6 C) Oral   SpO2 Readings from Last 3 Encounters:  11/27/11 93%  11/27/11 93%  11/13/11 97%     General: A&O x 3, WDWN male in NAD Pulmonary: normal non-labored breathing , without Rales, rhonchi,  wheezing Cardiac: Heart rate : regular ,  Abdomen:abdomen soft and non-tender Abdominal wound:clean, dry, intact  Neurologic: A&O X 3; Appropriate Affect ; SENSATION: normal; MOTOR FUNCTION:  moving all extremities equally. Speech is fluent/normal  Vascular Exam:BLE warm and well perfused Extremities without ischemic changes, no Gangrene , no cellulitis; no open wounds;   LOWER EXTREMITY PULSES           RIGHT                                      LEFT      POSTERIOR TIBIAL doppler doppler       DORSALIS PEDIS      ANTERIOR TIBIAL absent doppler    Assessment: [name] [name] is a 61 y.o. male who is 2 Days Post-Op Procedure(s): AORTA BIFEMORAL BYPASS GRAFT Pt pain controlled on dilaudid PCA Post-op ilieus -No flatus/no BM with 900cc NGT in 24 hours Atelectasis  Plan:  Cont NGT Dulcolax supp today ABI's today Transfer to 3300  IS 10x /hr W/A  [place] [phone] 11/27/2011 7:47 AM      I have reviewed and agree with above.Slow return of GI function.  Larina Earthly, MD 11/27/2011 10:23 AM

## 2011-11-27 NOTE — Progress Notes (Signed)
Occupational Therapy Evaluation Patient Details Name: [name] [name] MRN: [id number] DOB: 07-23-1951 Today's Date: 11/27/2011  Problem List:  Patient Active Problem List  Diagnoses  . Atherosclerosis of native arteries of the extremities with intermittent claudication  . PVD (peripheral vascular disease) with claudication    Past Medical History:  Past Medical History  Diagnosis Date  . Diabetes mellitus   . Hyperlipidemia   . Hypertension   . Arthritis   . Leg pain   . Seizure disorder   . Myocardial infarction     "silent heart attack by patient"  . Peripheral vascular disease   . Bronchitis   . 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 Chronic kidney disease     hx UTI, and kidney stones  . Seizures     last seizure "years ago"  . Coronary artery disease    Past Surgical History:  Past Surgical History  Procedure Date  . Thrombectomy     left to right fem-fem bypass  . Hernia repair   . Aortogram with iliac artery stenting 05/06/2011  . Tonsillectomy   . Aorta - bilateral femoral artery bypass graft 11/25/2011    Procedure: AORTA BIFEMORAL BYPASS GRAFT;  Surgeon: Larina Earthly, MD;  Location: [facility] OR;  Service: Vascular;  Laterality: N/A;    OT Assessment/Plan/Recommendation OT Assessment Clinical Impression Statement: Pt. will benefit from skilled OT to increase functional independence and get pt. to min assist to supervision level at D/C OT Recommendation/Assessment: Patient will need skilled OT in the acute care venue OT Problem List: Decreased strength;Decreased activity tolerance;Decreased safety awareness;Decreased knowledge of use of DME or AE Barriers to Discharge: Decreased caregiver support OT Therapy Diagnosis : Generalized weakness OT Plan OT Frequency: Min 2X/week OT Treatment/Interventions: Self-care/ADL training;DME and/or AE instruction;Therapeutic activities;Patient/family education;Balance training OT  Recommendation Recommendations for Other Services: Rehab consult Follow Up Recommendations: Inpatient Rehab Equipment Recommended: Defer to next venue Individuals Consulted Consulted and Agree with Results and Recommendations: Patient OT Goals Acute Rehab OT Goals OT Goal Formulation: With patient Time For Goal Achievement: 2 weeks ADL Goals Pt Will Perform Grooming: with set-up;with supervision;Standing at sink ADL Goal: Grooming - Progress: Progressing toward goals Pt Will Perform Lower Body Bathing: with min assist;Sit to stand from bed ADL Goal: Lower Body Bathing - Progress: Not met Pt Will Perform Lower Body Dressing: with min assist;Sit to stand from bed ADL Goal: Lower Body Dressing - Progress: Not met Pt Will Transfer to Toilet: with min assist;3-in-1;Stand pivot transfer ADL Goal: Toilet Transfer - Progress: Progressing toward goals Pt Will Perform Toileting - Hygiene: with set-up;Standing at 3-in-1/toilet ADL Goal: Toileting - Hygiene - Progress: Progressing toward goals  OT Evaluation Precautions/Restrictions  Precautions Precautions: Fall Precaution Comments: LE weakness RN reports pt's legs buckle in standing and pt required two person assist to transfer to chair.  Required Braces or Orthoses: No Restrictions Weight Bearing Restrictions: No Other Position/Activity Restrictions: Abdominal incission and arterial bp monitor. Pt has NG tube on wall suction, NPO,  IV in both arms and Cardiac monitor,  Prior Functioning Home Living Lives With: Friend(s) Receives Help From: Friend(s) Type of Home: Apartment Home Layout: One level Home Access: Stairs to enter Entrance Stairs-Rails: Left Entrance Stairs-Number of Steps: 2 Bathroom Shower/Tub: Forensic scientist: Standard Bathroom Accessibility: No Home Adaptive Equipment: None Prior Function Level of Independence: Independent with basic ADLs;Independent with homemaking with ambulation;Independent  with gait;Independent with transfers  Able to Take Stairs?: Yes Driving: Yes Vocation: On disability ADL ADL Eating/Feeding: Simulated;Set up Eating/Feeding Details (indicate cue type and reason): With hand to mouth Where Assessed - Eating/Feeding: Edge of bed Grooming: Performed;Wash/dry face;Set up Where Assessed - Grooming: Sitting, bed Upper Body Bathing: Simulated;Chest;Right arm;Left arm;Abdomen;Moderate assistance Where Assessed - Upper Body Bathing: Sitting, bed Lower Body Bathing: Simulated;Maximal assistance Where Assessed - Lower Body Bathing: Sit to stand from bed Upper Body Dressing: Simulated;Minimal assistance Upper Body Dressing Details (indicate cue type and reason): with donning gown Where Assessed - Upper Body Dressing: Sitting, bed Lower Body Dressing: Simulated;+1 Total assistance Where Assessed - Lower Body Dressing: Sit to stand from bed Toilet Transfer: Simulated;+2 Total assistance;Comment for patient % (pt=70%) Toilet Transfer Details (indicate cue type and reason): Max verbal cues for hand placement and technique Toilet Transfer Method: Stand pivot Toilet Transfer Equipment: Other (comment) Nurse, children's) Toileting - Clothing Manipulation: Simulated;Minimal assistance Where Assessed - Glass blower/designer Manipulation: Standing Toileting - Hygiene: Simulated;Minimal assistance Where Assessed - Toileting Hygiene: Standing Tub/Shower Transfer: Not assessed Tub/Shower Transfer Method: Not assessed ADL Comments: Pt. completed sit-stand EOB x2 with max verbal cues for hand placement and to achieve upright position due to pt. reporting decreased Bil LE weakness.  Vision/Perception  Vision - History Baseline Vision: No visual deficits Patient Visual Report: No change from baseline Vision - Assessment Eye Alignment: Within Functional Limits Vision Assessment: Vision not tested Cognition Cognition Arousal/Alertness: Awake/alert Overall Cognitive Status:  Impaired Orientation Level: Oriented X4 Safety/Judgement: Decreased awareness of safety precautions;Decreased safety judgement for tasks assessed Decreased Safety/Judgement: Impulsive;Decreased awareness of need for assistance Awareness of Deficits: Decreased awareness of deficits Cognition - Other Comments: Pt. with decreased safety awareness due to pain medication Sensation/Coordination   Extremity Assessment RUE Assessment RUE Assessment: Within Functional Limits LUE Assessment LUE Assessment: Within Functional Limits Mobility  Bed Mobility Bed Mobility: Yes Supine to Sit: 1: +2 Total assist;Patient percentage (comment);With rails (pt=60%) Supine to Sit Details (indicate cue type and reason): Mod verbal cues for use of rail and technique Sit to Supine - Right:  (pt=60%) Transfers Transfers: Yes Sit to Stand: 1: +2 Total assist;Patient percentage (comment);From bed;From elevated surface;With upper extremity assist (pt=70%) Sit to Stand Details (indicate cue type and reason): Mod verbal cues for upright position and for straightening of bil LE due to buckling.    End of Session OT - End of Session Equipment Utilized During Treatment: Gait belt Activity Tolerance: Patient tolerated treatment well Patient left: in chair;with call bell in reach Nurse Communication: Mobility status for transfers General Behavior During Session: [facility] for tasks performed Cognition: Impaired (due to pain meds)   Dejuan Elman, OTR/L Pager [phone] 11/27/2011, 12:55 PM

## 2011-11-27 NOTE — Progress Notes (Signed)
Physical Therapy Treatment Patient Details Name: [name] [name] MRN: [id number] DOB: Apr 10, 1951 Today's Date: 11/27/2011  PT Assessment/Plan  PT - Assessment/Plan Comments on Treatment Session: Pt very anxious about falling; needs a lot of encouragement today but once in the chair he felt much better and was releieved to have gotten up. Pt agrees he will need more assist than he can do at home. Will likely need either CIR or SNF prior to d/c home pending his progress.  PT Plan: Discharge plan remains appropriate Equipment Recommended: Defer to next venue PT Goals  Acute Rehab PT Goals PT Goal: Rolling Supine to Left Side - Progress: Progressing toward goal PT Goal: Supine/Side to Sit - Progress: Progressing toward goal PT Goal: Sit at Edge Of Bed - Progress: Progressing toward goal PT Goal: Sit to Supine/Side - Progress: Progressing toward goal PT Goal: Sit to Stand - Progress: Progressing toward goal PT Goal: Stand to Sit - Progress: Progressing toward goal PT Transfer Goal: Bed to Chair/Chair to Bed - Progress: Progressing toward goal PT Goal: Ambulate - Progress: Not met PT Goal: Up/Down Stairs - Progress: Not met  PT Treatment Precautions/Restrictions  Precautions Precautions: Fall Precaution Comments: LE weakness RN reports pt's legs buckle in standing and pt required two person assist to transfer to chair.  Required Braces or Orthoses: No Restrictions Weight Bearing Restrictions: No Other Position/Activity Restrictions: Abdominal incission and arterial bp monitor. Pt has NG tube on wall suction, NPO,  IV in both arms and Cardiac monitor,  Mobility (including Balance) Bed Mobility Supine to Sit: 1: +2 Total assist Supine to Sit Details (indicate cue type and reason): +2totalpt60% with cueing for encouragement and to roll left prior to sidelying->sit to reduce pressue to abdomen Transfers Sit to Stand: 1: +2 Total assist Sit to Stand Details (indicate cue type and reason): pt  stood +2totalpt70% with sequencing cues and from elevated bed; pt requiring much encouragement for anterior translation as pt very fearful to fully extend into standing Stand to Sit: 1: +2 Total assist Stand to Sit Details: sat back on the bed with +2totalpt70% as pt not able to get fully standing and transfer would have been unsafe with this sequencing Stand Pivot Transfers: 1: +2 Total assist Stand Pivot Transfer Details (indicate cue type and reason): +2totalpt65-70% with facilitation at hips to bring pt anteriorly over feet; pt still not standing fully erect but better ability once up to shuffle feet to left to get to chair, still needing cues for sequencing/reaching for arm rests; pt very fearful of falling    Exercise  Total Joint Exercises Ankle Circles/Pumps: AROM;Both;5 reps;Seated Heel Slides: AROM;Both;5 reps;Seated End of Session PT - End of Session Equipment Utilized During Treatment: Gait belt Activity Tolerance: Patient limited by pain;Patient limited by fatigue (limited by fear) Patient left: in chair Nurse Communication: Mobility status for transfers General Behavior During Session: [facility] for tasks performed Cognition:  (very anxious)  [name],[name] Cianci HELEN 11/27/2011, 4:03 PM

## 2011-11-27 NOTE — Progress Notes (Signed)
ABI completed.  Preliminary report is 0.53 on the right and 0.61 on the left. Smiley Houseman 11/27/2011, 9:08 AM

## 2011-11-27 NOTE — Progress Notes (Signed)
Pt. Transferred to room 2020 via w/c with cardiac monitor intact. VSS; no c/o discomforts. Pt. Place in bed tele box on; receiving nurse in room.

## 2011-11-27 NOTE — Progress Notes (Signed)
Utilization review completed. Nakisha Chai, RN, BSN. 11/27/11 

## 2011-11-28 ENCOUNTER — Ambulatory Visit: Payer: Medicaid Other | Admitting: Vascular Surgery

## 2011-11-28 LAB — BASIC METABOLIC PANEL
CO2: 23 mEq/L (ref 19–32)
Calcium: 8.7 mg/dL (ref 8.4–10.5)
Glucose, Bld: 102 mg/dL — ABNORMAL HIGH (ref 70–99)
Potassium: 3.9 mEq/L (ref 3.5–5.1)
Sodium: 137 mEq/L (ref 135–145)

## 2011-11-28 LAB — GLUCOSE, CAPILLARY
Glucose-Capillary: 103 mg/dL — ABNORMAL HIGH (ref 70–99)
Glucose-Capillary: 97 mg/dL (ref 70–99)
Glucose-Capillary: 123 mg/dL — ABNORMAL HIGH (ref 70–99)

## 2011-11-28 LAB — CBC
HCT: 29.2 % — ABNORMAL LOW (ref 39.0–52.0)
Hemoglobin: 9.6 g/dL — ABNORMAL LOW (ref 13.0–17.0)
MCH: 30.9 pg (ref 26.0–34.0)
MCV: 93.9 fL (ref 78.0–100.0)
RBC: 3.11 MIL/uL — ABNORMAL LOW (ref 4.22–5.81)

## 2011-11-28 NOTE — Progress Notes (Signed)
[facility] [place]  Post-op  Aortobifem bypass  Date of Surgery: 11/25/2011 Surgeon: Surgeon(s): Larina Earthly, MD Chuck Hint, MD POD: 3 Days Post-Op Procedure(s): AORTA BIFEMORAL BYPASS GRAFT  History of Present Illness  [name] [name] is a 61 y.o. male who is  up s/p Procedure(s): AORTA BIFEMORAL BYPASS GRAFT Pt is doing well. improved incisional pain; denies nausea/vomiting; denies diarrhea. has had flatus;has had BM  No results found.  Significant Diagnostic Studies: CBC    Component Value Date/Time   WBC 8.4 11/28/2011 0610   WBC 5.9 07/28/2009 1533   RBC 3.11* 11/28/2011 0610   RBC 3.98* 07/28/2009 1533   HGB 9.6* 11/28/2011 0610   HGB 12.5* 07/28/2009 1533   HCT 29.2* 11/28/2011 0610   HCT 37.1* 07/28/2009 1533   PLT 136* 11/28/2011 0610   PLT 145 07/28/2009 1533   MCV 93.9 11/28/2011 0610   MCV 93.3 07/28/2009 1533   MCH 30.9 11/28/2011 0610   MCH 31.3 07/28/2009 1533   MCHC 32.9 11/28/2011 0610   MCHC 33.6 07/28/2009 1533   RDW 13.5 11/28/2011 0610   RDW 14.7* 07/28/2009 1533   LYMPHSABS 2.4 11/25/2011 0640   LYMPHSABS 2.4 07/28/2009 1533   MONOABS 0.7 11/25/2011 0640   MONOABS 0.4 07/28/2009 1533   EOSABS 0.2 11/25/2011 0640   EOSABS 0.2 07/28/2009 1533   BASOSABS 0.1 11/25/2011 0640   BASOSABS 0.1 07/28/2009 1533    BMET    Component Value Date/Time   NA 135 11/26/2011 0350   K 4.1 11/26/2011 0350   CL 103 11/26/2011 0350   CO2 27 11/26/2011 0350   GLUCOSE 139* 11/26/2011 0350   BUN 6 11/26/2011 0350   CREATININE 0.51 11/26/2011 0350   CALCIUM 8.1* 11/26/2011 0350   GFRNONAA >90 11/26/2011 0350   GFRAA >90 11/26/2011 0350    COAG Lab Results  Component Value Date   INR 1.18 11/25/2011   INR 1.01 11/25/2011   INR 1.00 11/10/2011   No results found for this basename: PTT    I/O last 3 completed shifts: In: 2570 [I.V.:1800; NG/GT:470; IV Piggyback:300] Out: 2781 [Urine:1780; Emesis/NG output:1000; Stool:1]    Physical Examination BP Readings from Last 3  Encounters:  11/28/11 129/77  11/28/11 129/77  11/21/11 169/93   Temp Readings from Last 3 Encounters:  11/28/11 97.6 F (36.4 C) Oral  11/28/11 97.6 F (36.4 C) Oral  11/13/11 97.8 F (36.6 C) Oral   SpO2 Readings from Last 3 Encounters:  11/28/11 96%  11/28/11 96%  11/13/11 97%     General: A&O x 3, WDWN male in NAD Pulmonary: normal non-labored breathing , without Rales, rhonchi,  wheezing Cardiac: Heart rate : regular ,  Abdomen:abdomen soft, non-tender and min BS Abdominal wound:clean, dry, intact  Neurologic: A&O X 3; Appropriate Affect ; SENSATION: normal; MOTOR FUNCTION:  moving all extremities equally. Speech is fluent/normal  Vascular Exam:BLE warm and well perfused Extremities without ischemic changes, no Gangrene , no cellulitis; no open wounds;  Pulses unchanged  Non-Invasive Vascular Imaging ABI'S: Right 0.53; Left 0.61  Assessment: [name] [name] is a 61 y.o. male who is 3 Days Post-Op Procedure(s): AORTA BIFEMORAL BYPASS GRAFT Positive flatus and BM Still using PCA frequently Strict I/O  Plan: Mobilize D/C NGT - ice chips only D/C Foley in am    Marlowe Shores [phone] 11/28/2011 7:51 AM

## 2011-11-28 NOTE — Progress Notes (Signed)
Occupational Therapy Treatment Patient Details Name: [name] [name] MRN: [id number] DOB: 07/22/1951 Today's Date: 11/28/2011 14:10-14:31  1ta OT Assessment/Plan OT Assessment/Plan Comments on Treatment Session: Pt with very limited activity in OT secondary to pain and fatigue. Onlya able to tolerate standing for 2 intervals of less tahn 30 seconds.  Attempted to persuade pt to try walking to the bathroom or standing at the sink to help increase endurance and balance, however unable to persuade pt.  Feel he is also anxious about falling. OT Plan: Discharge plan remains appropriate (Only if pt will increase level of participation.) OT Frequency: Min 2X/week Follow Up Recommendations: Inpatient Rehab Equipment Recommended: Defer to next venue OT Goals ADL Goals ADL Goal: Grooming - Progress: Not met ADL Goal: Lower Body Bathing - Progress: Not met ADL Goal: Lower Body Dressing - Progress: Not met ADL Goal: Toilet Transfer - Progress: Not met ADL Goal: Toileting - Hygiene - Progress: Not met  OT Treatment Precautions/Restrictions  Precautions Precautions: Fall Required Braces or Orthoses: No Restrictions Weight Bearing Restrictions: No   ADL   Mobility  Bed Mobility Bed Mobility: No Transfers Sit to Stand: 5: Supervision;With upper extremity assist;With armrests;From chair/3-in-1 Sit to Stand Details (indicate cue type and reason): Min guard assist from bedside chair. Pt only able to tolerate standing for approximately 30 seconds before needing rest break secondary to fatigue.  Pt also demonstrates flexed posture in standing secondary to abdominal pain.  Unable to achieve any upright standing.   Exercises    End of Session OT - End of Session Activity Tolerance: Patient limited by fatigue;Patient limited by pain Patient left: in chair;with call bell in reach Nurse Communication: Mobility status for transfers General Behavior During Session: [facility] for tasks performed Cognition:  [facility] for tasks performed  Warwick Nick OTR/L 11/28/2011, 2:46 PM Pager number [phone]

## 2011-11-28 NOTE — Progress Notes (Signed)
Pharmacy - Dilantin  On Dilantin prior to admission for seizure disorder  Presents today for bypass graft for PVD  Dose PTA was Dilantin po 200 in AM, 300 mg in PM.  NGT d/c'ed today; ice chips for now. Plan:  1) Continue current regimen of Dilantin 250 mg IV q 12 hours  2) Change back to home regimen once tolerates diet well.

## 2011-11-28 NOTE — Progress Notes (Signed)
Pt had BM/reports feeling better afterwards.

## 2011-11-29 LAB — TYPE AND SCREEN
ABO/RH(D): A NEG
Antibody Screen: NEGATIVE
Unit division: 0
Unit division: 0

## 2011-11-29 LAB — GLUCOSE, CAPILLARY
Glucose-Capillary: 190 mg/dL — ABNORMAL HIGH (ref 70–99)
Glucose-Capillary: 182 mg/dL — ABNORMAL HIGH (ref 70–99)

## 2011-11-29 NOTE — Progress Notes (Signed)
Wasted 1 ml of Dilaudid and new syringe of dilaudid was replaced witness by  Argie Ramming  R.N. Wasted in sink. Settings reck and o.k.Marland Kitchen

## 2011-11-29 NOTE — Progress Notes (Addendum)
[facility] Progress Note  11/29/2011 7:46 AM POD 4  Feeling a bit better this am.  Filed Vitals:   11/29/11 0500  BP: 130/70  Pulse: 73  Temp: 99.1 F (37.3 C)  Resp: 16    Incisions:  Abdominal incision c/d/i with steri strips.  No drainage.  Bilateral groin incisions also with steri strips and no drainage. Extremities:  Bilateral feet warm and well perfused.  +palpable DP bilaterally Cardiac:  RRR Lungs:  CTAB Abdomen:  +BS;  NT/ND; Soft  CBC    Component Value Date/Time   WBC 8.4 11/28/2011 0610   WBC 5.9 07/28/2009 1533   RBC 3.11* 11/28/2011 0610   RBC 3.98* 07/28/2009 1533   HGB 9.6* 11/28/2011 0610   HGB 12.5* 07/28/2009 1533   HCT 29.2* 11/28/2011 0610   HCT 37.1* 07/28/2009 1533   PLT 136* 11/28/2011 0610   PLT 145 07/28/2009 1533   MCV 93.9 11/28/2011 0610   MCV 93.3 07/28/2009 1533   MCH 30.9 11/28/2011 0610   MCH 31.3 07/28/2009 1533   MCHC 32.9 11/28/2011 0610   MCHC 33.6 07/28/2009 1533   RDW 13.5 11/28/2011 0610   RDW 14.7* 07/28/2009 1533   LYMPHSABS 2.4 11/25/2011 0640   LYMPHSABS 2.4 07/28/2009 1533   MONOABS 0.7 11/25/2011 0640   MONOABS 0.4 07/28/2009 1533   EOSABS 0.2 11/25/2011 0640   EOSABS 0.2 07/28/2009 1533   BASOSABS 0.1 11/25/2011 0640   BASOSABS 0.1 07/28/2009 1533    BMET    Component Value Date/Time   NA 137 11/28/2011 0610   K 3.9 11/28/2011 0610   CL 100 11/28/2011 0610   CO2 23 11/28/2011 0610   GLUCOSE 102* 11/28/2011 0610   BUN 7 11/28/2011 0610   CREATININE 0.52 11/28/2011 0610   CALCIUM 8.7 11/28/2011 0610   GFRNONAA >90 11/28/2011 0610   GFRAA >90 11/28/2011 0610    INR    Component Value Date/Time   INR 1.18 11/25/2011 1313     Intake/Output Summary (Last 24 hours) at 11/29/11 0746 Last data filed at 11/29/11 0600  Gross per 24 hour  Intake   1225 ml  Output   1445 ml  Net   -220 ml     Assessment/Plan:  61 y.o. male is s/p aorot-bifemoral bypass graft POD 4 -progressing well.  - +BS and +flatus--advance diet to clear  liquids -mobilize   [name] [name], [place] [facility] [phone] 11/29/2011 7:46 AM    I have reviewed and agree with above.  Larina Earthly, MD 11/29/2011 3:17 PM

## 2011-11-30 LAB — GLUCOSE, CAPILLARY
Glucose-Capillary: 157 mg/dL — ABNORMAL HIGH (ref 70–99)
Glucose-Capillary: 147 mg/dL — ABNORMAL HIGH (ref 70–99)
Glucose-Capillary: 173 mg/dL — ABNORMAL HIGH (ref 70–99)

## 2011-11-30 LAB — HEMOGLOBIN A1C: Hgb A1c MFr Bld: 6.4 % — ABNORMAL HIGH (ref <5.7)

## 2011-11-30 MED ORDER — INSULIN ASPART 100 UNIT/ML ~~LOC~~ SOLN
0.0000 [IU] | Freq: Three times a day (TID) | SUBCUTANEOUS | Status: DC
  Administered 2011-11-30 – 2011-12-01 (×2): 2 [IU] via SUBCUTANEOUS
  Administered 2011-12-01: 3 [IU] via SUBCUTANEOUS
  Administered 2011-12-01: 2 [IU] via SUBCUTANEOUS
  Administered 2011-12-02: 3 [IU] via SUBCUTANEOUS
  Administered 2011-12-02 (×2): 2 [IU] via SUBCUTANEOUS
  Administered 2011-12-03 (×2): 3 [IU] via SUBCUTANEOUS
  Administered 2011-12-03: 8 [IU] via SUBCUTANEOUS
  Administered 2011-12-04: 2 [IU] via SUBCUTANEOUS
  Administered 2011-12-04: 3 [IU] via SUBCUTANEOUS

## 2011-11-30 NOTE — Progress Notes (Addendum)
[facility] [place]  Post-op  Note  Date of Surgery: 11/25/2011 Surgeon: Surgeon(s): Larina Earthly, MD Chuck Hint, MD POD: 5 Days Post-Op Procedure(s): AORTA BIFEMORAL BYPASS GRAFT  History of Present Illness  [name] [name] is a 61 y.o. male who is  up s/p Procedure(s): AORTA BIFEMORAL BYPASS GRAFT Pt is doing well. complains of incisional pain; min nausea after eating. has had flatus;has had BM  No results found.  Significant Diagnostic Studies: CBC    Component Value Date/Time   WBC 8.4 11/28/2011 0610   WBC 5.9 07/28/2009 1533   RBC 3.11* 11/28/2011 0610   RBC 3.98* 07/28/2009 1533   HGB 9.6* 11/28/2011 0610   HGB 12.5* 07/28/2009 1533   HCT 29.2* 11/28/2011 0610   HCT 37.1* 07/28/2009 1533   PLT 136* 11/28/2011 0610   PLT 145 07/28/2009 1533   MCV 93.9 11/28/2011 0610   MCV 93.3 07/28/2009 1533   MCH 30.9 11/28/2011 0610   MCH 31.3 07/28/2009 1533   MCHC 32.9 11/28/2011 0610   MCHC 33.6 07/28/2009 1533   RDW 13.5 11/28/2011 0610   RDW 14.7* 07/28/2009 1533   LYMPHSABS 2.4 11/25/2011 0640   LYMPHSABS 2.4 07/28/2009 1533   MONOABS 0.7 11/25/2011 0640   MONOABS 0.4 07/28/2009 1533   EOSABS 0.2 11/25/2011 0640   EOSABS 0.2 07/28/2009 1533   BASOSABS 0.1 11/25/2011 0640   BASOSABS 0.1 07/28/2009 1533    BMET    Component Value Date/Time   NA 137 11/28/2011 0610   K 3.9 11/28/2011 0610   CL 100 11/28/2011 0610   CO2 23 11/28/2011 0610   GLUCOSE 102* 11/28/2011 0610   BUN 7 11/28/2011 0610   CREATININE 0.52 11/28/2011 0610   CALCIUM 8.7 11/28/2011 0610   GFRNONAA >90 11/28/2011 0610   GFRAA >90 11/28/2011 0610    COAG Lab Results  Component Value Date   INR 1.18 11/25/2011   INR 1.01 11/25/2011   INR 1.00 11/10/2011   No results found for this basename: PTT    I/O last 3 completed shifts: In: 2050 [P.O.:840; I.V.:900; IV Piggyback:310] Out: 2050 [Urine:2050]    Physical Examination BP Readings from Last 3 Encounters:  11/30/11 143/82  11/30/11 143/82  11/21/11  169/93   Temp Readings from Last 3 Encounters:  11/30/11 98.1 F (36.7 C) Oral  11/30/11 98.1 F (36.7 C) Oral  11/13/11 97.8 F (36.6 C) Oral   SpO2 Readings from Last 3 Encounters:  11/30/11 96%  11/30/11 96%  11/13/11 97%     General: A&O x 3, WDWN male in NAD Pulmonary: normal non-labored breathing , without Rales, rhonchi,  wheezing Cardiac: Heart rate : regular ,  Abdomen:abdomen soft, non-tender, normal active bowel sounds and mildly distended. Abdominal wound:clean, dry, intact  Neurologic: A&O X 3; Appropriate Affect ; SENSATION: normal; MOTOR FUNCTION:  moving all extremities equally. Speech is fluent/normal Vascular Exam:BLE warm , with good sensation and motion Extremities without ischemic changes, no Gangrene , no cellulitis; no open wounds;  Assessment: [name] [name] is a 61 y.o. male who is 5 Days Post-Op Procedure(s): AORTA BIFEMORAL BYPASS GRAFT  Plan: Ambulate TID Advance diet ? DC PCA - pt still using mod to large amts for pain control.    Marlowe Shores [phone] 11/30/2011 9:01 AM      I have reviewed and agree with above.  Larina Earthly, MD 11/30/2011 9:48 AM

## 2011-12-01 LAB — GLUCOSE, CAPILLARY: Glucose-Capillary: 148 mg/dL — ABNORMAL HIGH (ref 70–99)

## 2011-12-01 MED ORDER — FAMOTIDINE 20 MG PO TABS
20.0000 mg | ORAL_TABLET | Freq: Two times a day (BID) | ORAL | Status: DC
  Administered 2011-12-01 – 2011-12-04 (×6): 20 mg via ORAL
  Filled 2011-12-01 (×7): qty 1

## 2011-12-01 MED ORDER — PHENYTOIN SODIUM EXTENDED 100 MG PO CAPS
300.0000 mg | ORAL_CAPSULE | Freq: Every day | ORAL | Status: DC
  Administered 2011-12-01 – 2011-12-03 (×3): 300 mg via ORAL
  Filled 2011-12-01 (×4): qty 3

## 2011-12-01 MED ORDER — PHENYTOIN SODIUM EXTENDED 100 MG PO CAPS
200.0000 mg | ORAL_CAPSULE | Freq: Every day | ORAL | Status: DC
  Administered 2011-12-02 – 2011-12-04 (×3): 200 mg via ORAL
  Filled 2011-12-01 (×3): qty 2

## 2011-12-01 NOTE — Progress Notes (Signed)
Patient ID: [name] [name], male   DOB: [birth year], 61 y.o.   MRN: [id number] Walking well. + flatus.  Abd soft, nontender. 2+ fem pulses. wds all healing  Regular diet. Mobility   Tawanna Cooler EarlyMD

## 2011-12-01 NOTE — Progress Notes (Signed)
PCA syringe replaced, verified by myself and Edrick Oh RN.

## 2011-12-01 NOTE — Progress Notes (Signed)
MEDICATION RELATED CONSULT NOTE - FOLLOW UP   Pharmacy Consult for Dilantin Indication: Hx seizures  Assessment: 61 y.o. M on dilantin po PTA for hx seizures however developed post-op ileus this admission requiring change to IV therapy. The patient is now +flatus/BM and has progressed to a regular diet. Will change IV dilantin back to home dose of po.  Goal of Therapy:  Phenytoin level: 10-20 (when corrected for low albumin and renal fxn)  Plan:  1) Discontinue IV Dilantin 2) Resume home dilantin dose of 200 mg every a.m and 300 mg every p.m. 3) Will continue to monitor oral tolerance for any additional changes  Georgina Pillion, PharmD, BCPS 12/01/2011 12:54 PM       Allergies  Allergen Reactions  . Penicillins Swelling  . Zetia (Ezetimibe)     Aches all over, short of breath    Patient Measurements: Height: 6\' 3"  (190.5 cm) Weight: 166 lb 0.1 oz (75.3 kg) IBW/kg (Calculated) : 84.5    Vital Signs: Temp: 98.1 F (36.7 C) (01/06 0425) Temp src: Oral (01/06 0425) BP: 134/78 mmHg (01/06 0425) Pulse Rate: 61  (01/06 0425) Intake/Output from previous day: 01/05 0701 - 01/06 0700 In: -  Out: 2601 [Urine:2600; Stool:1] Intake/Output from this shift: Total I/O In: -  Out: 300 [Urine:300]  Labs: No results found for this basename: WBC:3,HGB:3,HCT:3,PLT:3,APTT:3;INR:3,CREATININE:3,LABCREA:3,CREATININE:3,CREAT24HRUR:3,MG:3,PHOS:3,ALBUMIN:3,PROT:3,ALBUMIN:3,AST:3,ALT:3,ALKPHOS:3,BILITOT:3,BILIDIR:3,IBILI:3 in the last 72 hours Estimated Creatinine Clearance: 105.9 ml/min (by C-G formula based on Cr of 0.52).   Microbiology: Recent Results (from the past 720 hour(s))  SURGICAL PCR SCREEN     Status: Normal   Collection Time   11/25/11  6:58 AM      Component Value Range Status Comment   MRSA, PCR NEGATIVE  NEGATIVE  Final    Staphylococcus aureus NEGATIVE  NEGATIVE  Final     Medications:  Prescriptions prior to admission  Medication Sig Dispense Refill  .  aspirin 81 MG tablet Take 81 mg by mouth daily.        Marland Kitchen glipiZIDE (GLUCOTROL) 10 MG tablet Take 10 mg by mouth 2 (two) times daily before a meal.        . metFORMIN (GLUCOPHAGE) 500 MG tablet Take 500 mg by mouth 2 (two) times daily with a meal.        . Multiple Vitamin (MULITIVITAMIN WITH MINERALS) TABS Take 1 tablet by mouth daily.        . phenytoin (DILANTIN) 100 MG ER capsule Take 200-300 mg by mouth 2 (two) times daily. Take 200 mg in the morning & 300 mg in the evening.      . Turmeric, Curcuma Longa, POWD Take 1 tablet by mouth daily.        Scheduled:    . chlorhexidine  15 mL Mouth/Throat BID  . docusate sodium  100 mg Oral Daily  . enoxaparin  40 mg Subcutaneous Q24H  . famotidine (PEPCID) IV  20 mg Intravenous Q12H  . HYDROmorphone PCA 0.3 mg/mL   Intravenous Q4H  . insulin aspart  0-15 Units Subcutaneous TID WC  . phenytoin (DILANTIN) IV  250 mg Intravenous BID

## 2011-12-02 LAB — POCT I-STAT 7, (LYTES, BLD GAS, ICA,H+H)
Acid-Base Excess: 2 mmol/L (ref 0.0–2.0)
Bicarbonate: 27.5 mEq/L — ABNORMAL HIGH (ref 20.0–24.0)
O2 Saturation: 100 %
TCO2: 29 mmol/L (ref 0–100)
pO2, Arterial: 301 mmHg — ABNORMAL HIGH (ref 80.0–100.0)

## 2011-12-02 LAB — CREATININE, SERUM
GFR calc Af Amer: >90 mL/min (ref >90)
GFR calc non Af Amer: >90 mL/min (ref >90)

## 2011-12-02 LAB — GLUCOSE, CAPILLARY
Glucose-Capillary: 144 mg/dL — ABNORMAL HIGH (ref 70–99)
Glucose-Capillary: 181 mg/dL — ABNORMAL HIGH (ref 70–99)

## 2011-12-02 MED ORDER — OXYCODONE-ACETAMINOPHEN 5-325 MG PO TABS
1.0000 | ORAL_TABLET | ORAL | Status: DC | PRN
  Administered 2011-12-02 – 2011-12-04 (×7): 2 via ORAL
  Filled 2011-12-02 (×7): qty 2

## 2011-12-02 MED ORDER — HYDROMORPHONE HCL PF 1 MG/ML IJ SOLN
1.0000 mg | INTRAMUSCULAR | Status: DC | PRN
  Administered 2011-12-02 – 2011-12-03 (×3): 1 mg via INTRAVENOUS
  Filled 2011-12-02 (×3): qty 1

## 2011-12-02 MED FILL — Sodium Chloride IV Soln 0.9%: INTRAVENOUS | Qty: 2000 | Status: AC

## 2011-12-02 MED FILL — Heparin Sodium (Porcine) Inj 1000 Unit/ML: INTRAMUSCULAR | Qty: 30 | Status: AC

## 2011-12-02 NOTE — Progress Notes (Signed)
Pt says after going home he's cut down a lot on his own. He was smoking 1 1/2 ppd and plans to continue until he quits. Pt in preparation/ action stage. Pt had a lot of questions about smoking and its overall affect on his health which were addressed. Emphasized pharmacotherapy options available again. Referred to 1-800 quit now for f/u and support. Discussed oral fixation substitutes, second hand smoke and in home smoking policy. Reviewed and gave pt Written education/contact information.

## 2011-12-02 NOTE — Progress Notes (Signed)
Physical Therapy Treatment Patient Details Name: [name] [name] MRN: [id number] DOB: Sep 29, 1951 Today's Date: 12/02/2011  PT Assessment/Plan  PT - Assessment/Plan Comments on Treatment Session: Pt admitted for aortobifem BPG. Pt familiar from prior admissions and nearing baseline. 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 Pt functional strength is greater than what he presents with during myotome testing and exercise. Feel pt would be able to function at home at this point.  PT Plan: Discharge plan needs to be updated PT Frequency: Min 3X/week Follow Up Recommendations: Home health PT Equipment Recommended: None recommended by PT PT Goals  Acute Rehab PT Goals Time For Goal Achievement: Other (comment) PT Goal: Rolling Supine to Left Side - Progress: Other (comment) PT Goal: Sit at Edge Of Bed - Progress: Met PT Goal: Sit to Supine/Side - Progress: Other (comment) Pt will go Sit to Stand: Independently PT Goal: Sit to Stand - Progress: Updated due to goal met Pt will go Stand to Sit: Independently PT Goal: Stand to Sit - Progress: Updated due to goals met PT Transfer Goal: Bed to Chair/Chair to Bed - Progress: Met Pt will Ambulate: >150 feet;Independently PT Goal: Ambulate - Progress: Revised (modified due to lack of progress/goal met) PT Goal: Up/Down Stairs - Progress: Progressing toward goal  PT Treatment Precautions/Restrictions  Precautions Precautions: Fall Precaution Comments: none Required Braces or Orthoses: No Restrictions Weight Bearing Restrictions: No Other Position/Activity Restrictions:  (none) Mobility (including Balance) Bed Mobility Bed Mobility: No Transfers Sit to Stand: 6: Modified independent (Device/Increase time);From bed Stand to Sit: To chair/3-in-1;6: Modified independent (Device/Increase time) Ambulation/Gait Ambulation/Gait:  Yes Ambulation/Gait Assistance: 6: Modified independent (Device/Increase time) Ambulation Distance (Feet): 250 Feet Assistive device: None Gait Pattern: Decreased stride length Stairs: No  Posture/Postural Control Posture/Postural Control: No significant limitations Balance Balance Assessed: Yes Static Sitting Balance Static Sitting - Balance Support: Feet supported Static Sitting - Level of Assistance: 7: Independent Exercise  General Exercises - Lower Extremity Short Arc Quad: AAROM;Both;10 reps;Seated Hip ABduction/ADduction: AAROM;Both;10 reps;Seated Hip Flexion/Marching: AAROM;Both;10 reps;Seated End of Session PT - End of Session Equipment Utilized During Treatment: Gait belt Activity Tolerance: Patient tolerated treatment well Patient left: in chair;with call bell in reach Nurse Communication: Mobility status for transfers;Mobility status for ambulation General Behavior During Session: [facility] for tasks performed Cognition: [facility] for tasks performed  Delorse Lek 12/02/2011, 2:29 PM Toney Sang, PT [phone]

## 2011-12-02 NOTE — Progress Notes (Signed)
Wasted didaudid pca 12ml  Equals 3.6 mg iv witness by sharon r.n. Press photographer . Wasted in sink.

## 2011-12-02 NOTE — Progress Notes (Signed)
Subjective: Interval History: none.. +BM,  Tolerating diet Objective: Vital signs in last 24 hours: Temp:  [97.8 [name] (36.6 C)-99.3 [name] (37.4 C)] 98.2 [name] (36.8 C) (01/07 0435) Pulse Rate:  [71-97] 77  (01/07 0435) Resp:  [18-20] 19  (01/07 0435) BP: (124-157)/(74-95) 157/95 mmHg (01/07 0435) SpO2:  [95 %-98 %] 98 % (01/07 0435)  Intake/Output from previous day: 01/06 0701 - 01/07 0700 In: 240 [I.V.:240] Out: 1803 [Urine:1800; Stool:3] Intake/Output this shift: Total I/O In: 596 [P.O.:596] Out: 950 [Urine:950]  General appearance: alert, cooperative and no distress GI: soft, non-tender Extremities: extremities normal, atraumatic, no cyanosis or edema  Lab Results: No results found for this basename: WBC:2,HGB:2,HCT:2,PLT:2 in the last 72 hours BMET  Basename 12/02/11 0500  NA --  K --  CL --  CO2 --  GLUCOSE --  BUN --  CREATININE 0.49*  CALCIUM --    Studies/Results: Dg Chest 2 View  11/11/2011  *RADIOLOGY REPORT*  Clinical Data: Preoperative cardiopulmonary evaluation.  CHEST - 2 VIEW  Comparison: None.  Findings: Cardiac silhouette is upper range of normal size.  No hilar or mediastinal lesions are seen.  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 No pulmonary edema, pneumonia, or pleural effusion is seen.  There is very minimal degenerative spondylosis compatible with age.  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 No pulmonary edema, pneumonia, or pleural effusion is seen.  Original Report Authenticated By: Crawford Givens, M.D.   Dg Chest Portable 1 View  11/26/2011  *RADIOLOGY REPORT*  Clinical Data: Postop aortic valve.  PORTABLE CHEST - 1 VIEW  Comparison: 11/25/2011 and 11/11/2011  Findings: Nasogastric tube courses into the stomach and continues below the edge of the image.  Right IJ sheath terminates in the proximal superior vena cava.  Lung volumes are low with streaky  bibasilar atelectasis.  Heart and mediastinal contours are stable.  Pulmonary vascularity appears within normal limits.  No visible pleural effusion or pneumothorax.  IMPRESSION: Low lung volumes with bibasilar atelectasis.  No significant change in aeration compared to 11/25/2011.  Original Report Authenticated By: Britta Mccreedy, M.D.   Dg Chest Portable 1 View  11/25/2011  *RADIOLOGY REPORT*  Clinical Data: 61 year old male status post Swan placement.  PORTABLE CHEST - 1 VIEW  Comparison: 11/11/2011.  Findings: Portable semi upright AP view at 1348 hours.  Right IJ approach Swan-Ganz catheter traverses the heart and pulmonary outflow tract and terminates at the right lung base.  This is about 4 cm distal to the level of the right hilum.  Lower lung volumes.  Enteric tube in place courses to the left upper quadrant.  No pneumothorax.  Crowding of lung markings without overt edema.  No pleural effusion or consolidation.  Stable cardiac size and mediastinal contours.  IMPRESSION: 1.  Right IJ approach Swan-Ganz catheter appears wedged at the right lung base.  This is about 4 cm distal to the right main pulmonary artery level. 2.  Lower lung volumes, no other acute cardiopulmonary abnormality.  Original Report Authenticated By: Harley Hallmark, M.D.   Anti-infectives: Anti-infectives     Start     Dose/Rate Route Frequency Ordered Stop   11/25/11 2000   vancomycin (VANCOCIN) 2,000 mg in sodium chloride 0.9 % 500 mL IVPB     Comments: For 1 dose post-op per Rene Kocher Roczhiak      2,000 mg 250 mL/hr over 120 Minutes Intravenous  Once 11/25/11 1516 11/25/11 2305   11/25/11  1515   cefUROXime (ZINACEF) 1.5 g in dextrose 5 % 50 mL IVPB  Status:  Discontinued        1.5 g 100 mL/hr over 30 Minutes Intravenous Every 12 hours 11/25/11 1500 11/25/11 1511   11/25/11 0600   vancomycin (VANCOCIN) IVPB 1000 mg/200 mL premix        1,000 mg 200 mL/hr over 60 Minutes Intravenous On call to O.R. 11/24/11 1411 11/25/11  0723          Assessment/Plan: s/p Procedure(s): AORTA BIFEMORAL BYPASS GRAFT Doing well,  mobility.  D/C PCA.  Home in 1-2 days   LOS: 7 days   [name] [name] 12/02/2011, 1:29 PM

## 2011-12-02 NOTE — Progress Notes (Signed)
1.8 mg wasted in sharps, witnessed by RN Margaret Pyle.

## 2011-12-02 NOTE — Progress Notes (Signed)
Utilization review completed. Jacquel Mccamish, RN, BSN. 12/02/11 

## 2011-12-03 LAB — GLUCOSE, CAPILLARY: Glucose-Capillary: 272 mg/dL — ABNORMAL HIGH (ref 70–99)

## 2011-12-03 MED ORDER — GABAPENTIN 300 MG PO CAPS
300.0000 mg | ORAL_CAPSULE | Freq: Three times a day (TID) | ORAL | Status: DC
  Administered 2011-12-03 – 2011-12-04 (×3): 300 mg via ORAL
  Filled 2011-12-03 (×8): qty 1

## 2011-12-03 NOTE — Progress Notes (Signed)
Subjective: Interval History: none.. still with pain controlled with narcotics. He does have some neuropathic pain in his right foot  Objective: Vital signs in last 24 hours: Temp:  [98.1 F (36.7 C)-98.3 F (36.8 C)] 98.3 F (36.8 C) (01/08 0439) Pulse Rate:  [65-70] 65  (01/08 0439) Resp:  [18-19] 19  (01/08 0439) BP: (134-137)/(81-82) 137/82 mmHg (01/08 0439) SpO2:  [94 %-98 %] 98 % (01/08 0439) Weight:  [152 lb 6.4 oz (69.128 kg)] 152 lb 6.4 oz (69.128 kg) (01/08 1610)  Intake/Output from previous day: 01/07 0701 - 01/08 0700 In: 1196 [P.O.:1196] Out: 1400 [Urine:1400] Intake/Output this shift:    General appearance: alert, cooperative and no distress GI: Incision healing quite nicely 2+ femoral pulses Extremities:   Lab Results: No results found for this basename: WBC:2,HGB:2,HCT:2,PLT:2 in the last 72 hours BMET  Basename 12/02/11 0500  NA --  K --  CL --  CO2 --  GLUCOSE --  BUN --  CREATININE 0.49*  CALCIUM --    Studies/Results: Dg Chest 2 View  11/11/2011  *RADIOLOGY REPORT*  Clinical Data: Preoperative cardiopulmonary evaluation.  CHEST - 2 VIEW  Comparison: None.  Findings: Cardiac silhouette is upper range of normal size.  No hilar or mediastinal lesions are seen.  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 No pulmonary edema, pneumonia, or pleural effusion is seen.  There is very minimal degenerative spondylosis compatible with age.  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 No pulmonary edema, pneumonia, or pleural effusion is seen.  Original Report Authenticated By: Crawford Givens, M.D.   Dg Chest Portable 1 View  11/26/2011  *RADIOLOGY REPORT*  Clinical Data: Postop aortic valve.  PORTABLE CHEST - 1 VIEW  Comparison: 11/25/2011 and 11/11/2011  Findings: Nasogastric tube courses into the stomach and continues below the edge of the image.  Right IJ sheath terminates  in the proximal superior vena cava.  Lung volumes are low with streaky bibasilar atelectasis.  Heart and mediastinal contours are stable.  Pulmonary vascularity appears within normal limits.  No visible pleural effusion or pneumothorax.  IMPRESSION: Low lung volumes with bibasilar atelectasis.  No significant change in aeration compared to 11/25/2011.  Original Report Authenticated By: Britta Mccreedy, M.D.   Dg Chest Portable 1 View  11/25/2011  *RADIOLOGY REPORT*  Clinical Data: 61 year old male status post Swan placement.  PORTABLE CHEST - 1 VIEW  Comparison: 11/11/2011.  Findings: Portable semi upright AP view at 1348 hours.  Right IJ approach Swan-Ganz catheter traverses the heart and pulmonary outflow tract and terminates at the right lung base.  This is about 4 cm distal to the level of the right hilum.  Lower lung volumes.  Enteric tube in place courses to the left upper quadrant.  No pneumothorax.  Crowding of lung markings without overt edema.  No pleural effusion or consolidation.  Stable cardiac size and mediastinal contours.  IMPRESSION: 1.  Right IJ approach Swan-Ganz catheter appears wedged at the right lung base.  This is about 4 cm distal to the right main pulmonary artery level. 2.  Lower lung volumes, no other acute cardiopulmonary abnormality.  Original Report Authenticated By: Harley Hallmark, M.D.   Anti-infectives: Anti-infectives     Start     Dose/Rate Route Frequency Ordered Stop   11/25/11 2000   vancomycin (VANCOCIN) 2,000 mg in sodium chloride 0.9 % 500 mL IVPB     Comments: For 1 dose post-op per Rosalio Loud  2,000 mg 250 mL/hr over 120 Minutes Intravenous  Once 11/25/11 1516 11/25/11 2305   11/25/11 1515   cefUROXime (ZINACEF) 1.5 g in dextrose 5 % 50 mL IVPB  Status:  Discontinued        1.5 g 100 mL/hr over 30 Minutes Intravenous Every 12 hours 11/25/11 1500 11/25/11 1511   11/25/11 0600   vancomycin (VANCOCIN) IVPB 1000 mg/200 mL premix        1,000 mg 200  mL/hr over 60 Minutes Intravenous On call to O.R. 11/24/11 1411 11/25/11 0723          Assessment/Plan: s/p Procedure(s): AORTA BIFEMORAL BYPASS GRAFT And well overall. Will add Neurontin for right foot discomfort. Tolerating regular diet. Plan discharge tomorrow   LOS: 8 days   Elizabethanne Lusher F 12/03/2011, 7:45 AM

## 2011-12-04 LAB — GLUCOSE, CAPILLARY
Glucose-Capillary: 151 mg/dL — ABNORMAL HIGH (ref 70–99)
Glucose-Capillary: 143 mg/dL — ABNORMAL HIGH (ref 70–99)

## 2011-12-04 MED ORDER — POLYETHYLENE GLYCOL 3350 17 G PO PACK
17.0000 g | PACK | Freq: Once | ORAL | Status: AC
  Administered 2011-12-04: 17 g via ORAL
  Filled 2011-12-04: qty 1

## 2011-12-04 MED ORDER — OXYCODONE HCL 5 MG PO TABS: 5.0000 mg | ORAL_TABLET | ORAL | Status: AC | PRN

## 2011-12-04 MED ORDER — GABAPENTIN 300 MG PO CAPS: 300.0000 mg | ORAL_CAPSULE | Freq: Two times a day (BID) | ORAL | Status: DC

## 2011-12-04 NOTE — Progress Notes (Addendum)
[facility] Progress Note  12/04/2011 8:30 AM POD 9  States the neurontin helped, but it "knocked him out".  Requesting laxative as last BM ~ 3 days ago.  Afebrile;  92%RA Filed Vitals:   12/04/11 0451  BP: 138/91  Pulse: 67  Temp: 98.1 F (36.7 C)  Resp: 12    Incisions:  All incisions are c/d/i with steri strips in place Extremities:  No edema good doppler signal PT bilaterally Abdomen:  Soft; mild distension; NT; + BS Cardiac:  RRR Lungs:  Non-labored.  CBC    Component Value Date/Time   WBC 8.4 11/28/2011 0610   WBC 5.9 07/28/2009 1533   RBC 3.11* 11/28/2011 0610   RBC 3.98* 07/28/2009 1533   HGB 9.6* 11/28/2011 0610   HGB 12.5* 07/28/2009 1533   HCT 29.2* 11/28/2011 0610   HCT 37.1* 07/28/2009 1533   PLT 136* 11/28/2011 0610   PLT 145 07/28/2009 1533   MCV 93.9 11/28/2011 0610   MCV 93.3 07/28/2009 1533   MCH 30.9 11/28/2011 0610   MCH 31.3 07/28/2009 1533   MCHC 32.9 11/28/2011 0610   MCHC 33.6 07/28/2009 1533   RDW 13.5 11/28/2011 0610   RDW 14.7* 07/28/2009 1533   LYMPHSABS 2.4 11/25/2011 0640   LYMPHSABS 2.4 07/28/2009 1533   MONOABS 0.7 11/25/2011 0640   MONOABS 0.4 07/28/2009 1533   EOSABS 0.2 11/25/2011 0640   EOSABS 0.2 07/28/2009 1533   BASOSABS 0.1 11/25/2011 0640   BASOSABS 0.1 07/28/2009 1533    BMET    Component Value Date/Time   NA 137 11/28/2011 0610   K 3.9 11/28/2011 0610   CL 100 11/28/2011 0610   CO2 23 11/28/2011 0610   GLUCOSE 102* 11/28/2011 0610   BUN 7 11/28/2011 0610   CREATININE 0.49* 12/02/2011 0500   CALCIUM 8.7 11/28/2011 0610   GFRNONAA >90 12/02/2011 0500   GFRAA >90 12/02/2011 0500    INR    Component Value Date/Time   INR 1.18 11/25/2011 1313     Intake/Output Summary (Last 24 hours) at 12/04/11 0830 Last data filed at 12/04/11 0400  Gross per 24 hour  Intake    960 ml  Output   1575 ml  Net   -615 ml     Assessment/Plan:  61 y.o. male is s/p AAA POD 9 -will change neurontin to bid to see if he tolerates this better. -Will order a  laxative this am. (miralax) -home later this am after laxative. -tolerating diet; no N/V  Newton Pigg, PA-C [facility] [phone] 12/04/2011 8:30 AM

## 2011-12-04 NOTE — Progress Notes (Addendum)
Physical Therapy Treatment Patient Details Name: [name] [name] MRN: [id number] DOB: Jun 08, 1951 Today's Date: 12/04/2011  Pt declining PT and OT at this time secondary to anticipating D/C to home later today.    Sunny Schlein, [place] [phone] 12/04/2011, 11:16 AM  [place]    OTR/L Pager: [phone] 12/04/2011

## 2011-12-04 NOTE — Progress Notes (Signed)
Pt going home with f/u appointment, prescriptions and DC instructions.

## 2011-12-04 NOTE — Discharge Summary (Signed)
[facility] Discharge Summary  [name] [name] December 24, 1950 61 y.o. male  [id number]  Admission Date: 11/25/2011  Discharge Date: 12/04/11  Physician: Larina Earthly, MD  Admission Diagnosis: PVD   HPI:   This is a 61 y.o. male who presents with chief complaint: right calf cramping and pain with very short distance ambulation. The patient previous has undergone endovascular interventions on left iliac arterial system and left-to-right fem-fem bypass. The fem-fem bypass required thrombecomy on 04/09/11. Pain is described as cramping, severity 5-10/10, and associated with ambulation. Patient notes this pain is similar to the episode of thrombosis. Additionally, he notes the fem-fem pulse has been worsening over the last month. Patient has attempted to treat this pain with rest. The patient has intermittent rest pain symptoms also and no leg wounds/ulcers. Atherosclerotic risk factors include: DM, hyperlipidemia, HTN, smoking. Of note, baseline this patient has paraesthsias and anesthesia in the right foot. He was able to ambulated into the hospital.    Hospital Course:  The patient was admitted to the hospital and taken to the operating room on 11/25/2011 and underwent AAA repair.  The pt tolerated the procedure well and was transported to the PACU in good condition. By POD 1, he was doing well.  His NGT had increased output and remained in place.  He was left in the ICU that day.  bY POD 2, his pain was controlled with a PCA.  He was transferred to the step down unit that day.  By POD 3, he was having flatus and BM and his diet was increased slowly with ice chips.  He was transferred to the telemetry floor on POD 2.  The remainder of his hospital course included mobilization.  His diet was advanced without difficulty.  His PCA was d/c'd on POD 5.  He did continue to have neuropathic pain in his right foot and he was started on neurontin 300mg  tid.  He did have some drowsiness from  this and it was cut back to 300 mg bid.  He is d/c'd home on POD 9.  He did receive a laxative before d/c.    Discharge Instructions:   The patient is discharged to home with extensive instructions on wound care and progressive ambulation.  They are instructed not to drive or perform any heavy lifting until returning to see the physician in his office.  Discharge Diagnosis:  PVD  Secondary Diagnosis: Patient Active Problem List  Diagnoses  . Atherosclerosis of native arteries of the extremities with intermittent claudication  . PVD (peripheral vascular disease) with claudication   Past Medical History  Diagnosis Date  . Diabetes mellitus   . Hyperlipidemia   . Hypertension   . Arthritis   . Leg pain   . Seizure disorder   . Myocardial infarction     "silent heart attack by patient"  . Peripheral vascular disease   . Bronchitis   . 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 Chronic kidney disease     hx UTI, and kidney stones  . Seizures     last seizure "years ago"  . Coronary artery disease    Discharge Orders    Future Appointments: Provider: Department: Dept Phone: Center:   04/07/2012 2:00 PM [facility] [phone] [facility]   04/07/2012 2:45 PM Larina Earthly, MD [facility] [phone] [facility]     Future Orders Please Complete By Expires   Resume  previous diet      Driving Restrictions      Comments:   No driving for 2 weeks   Lifting restrictions      Comments:   No lifting for 6 weeks   Call MD for:  temperature >100.5      Call MD for:  redness, tenderness, or signs of infection (pain, swelling, bleeding, redness, odor or green/yellow discharge around incision site)      Call MD for:  severe or increased pain, loss or decreased feeling  in affected limb(s)      may wash over wound with mild soap and water      Scheduling Instructions:   Shower daily with soap and water   ABDOMINAL PROCEDURE/ANEURYSM  REPAIR/AORTO-BIFEMORAL BYPASS:  Call MD for increased abdominal pain; cramping diarrhea; nausea/vomiting           Skye, Rodarte  Home Medication Instructions [facility]:[id number]   Printed on:12/04/11 0852  Medication Information                    phenytoin (DILANTIN) 100 MG ER capsule Take 200-300 mg by mouth 2 (two) times daily. Take 200 mg in the morning & 300 mg in the evening.           glipiZIDE (GLUCOTROL) 10 MG tablet Take 10 mg by mouth 2 (two) times daily before a meal.             Turmeric, Curcuma Longa, POWD Take 1 tablet by mouth daily.            aspirin 81 MG tablet Take 81 mg by mouth daily.             metFORMIN (GLUCOPHAGE) 500 MG tablet Take 500 mg by mouth 2 (two) times daily with a meal.             Multiple Vitamin (MULITIVITAMIN WITH MINERALS) TABS Take 1 tablet by mouth daily.             gabapentin (NEURONTIN) 300 MG capsule Take 1 capsule (300 mg total) by mouth 2 (two) times daily before a meal.           oxyCODONE (OXY IR/ROXICODONE) 5 MG immediate release tablet Take 1 tablet (5 mg total) by mouth every 4 (four) hours as needed for pain. For pain             Disposition: home  Patient's condition: is Good  Follow up:  Dr. Arbie Cookey 2 weeks   Newton Pigg, PA-C [facility] [phone] 12/04/2011  8:52 AM

## 2011-12-06 ENCOUNTER — Emergency Department (HOSPITAL_COMMUNITY): Payer: Medicaid Other

## 2011-12-06 ENCOUNTER — Other Ambulatory Visit: Payer: Self-pay

## 2011-12-06 ENCOUNTER — Emergency Department (HOSPITAL_COMMUNITY)
Admission: EM | Admit: 2011-12-06 | Discharge: 2011-12-07 | Disposition: A | Discharge status: Home / Self Care | Payer: Medicaid Other | Attending: Emergency Medicine | Admitting: Emergency Medicine

## 2011-12-06 ENCOUNTER — Encounter (HOSPITAL_COMMUNITY): Payer: Self-pay

## 2011-12-06 DIAGNOSIS — I252 Old myocardial infarction: Secondary | ICD-10-CM | POA: Insufficient documentation

## 2011-12-06 DIAGNOSIS — I25810 Atherosclerosis of coronary artery bypass graft(s) without angina pectoris: Secondary | ICD-10-CM | POA: Insufficient documentation

## 2011-12-06 DIAGNOSIS — E119 Type 2 diabetes mellitus without complications: Secondary | ICD-10-CM | POA: Insufficient documentation

## 2011-12-06 DIAGNOSIS — R0989 Other specified symptoms and signs involving the circulatory and respiratory systems: Secondary | ICD-10-CM | POA: Insufficient documentation

## 2011-12-06 DIAGNOSIS — R0602 Shortness of breath: Secondary | ICD-10-CM | POA: Insufficient documentation

## 2011-12-06 DIAGNOSIS — R0609 Other forms of dyspnea: Secondary | ICD-10-CM | POA: Insufficient documentation

## 2011-12-06 DIAGNOSIS — R079 Chest pain, unspecified: Secondary | ICD-10-CM | POA: Insufficient documentation

## 2011-12-06 DIAGNOSIS — I10 Essential (primary) hypertension: Secondary | ICD-10-CM | POA: Insufficient documentation

## 2011-12-06 DIAGNOSIS — Z951 Presence of aortocoronary bypass graft: Secondary | ICD-10-CM | POA: Insufficient documentation

## 2011-12-06 LAB — BASIC METABOLIC PANEL
CO2: 28 mEq/L (ref 19–32)
Calcium: 10 mg/dL (ref 8.4–10.5)
Creatinine, Ser: 0.56 mg/dL (ref 0.50–1.35)
GFR calc Af Amer: >90 mL/min (ref >90)
Sodium: 136 mEq/L (ref 135–145)

## 2011-12-06 LAB — POCT I-STAT TROPONIN I: Troponin i, poc: 0 ng/mL (ref 0.00–0.08)

## 2011-12-06 LAB — D-DIMER, QUANTITATIVE: D-Dimer, Quant: 2.97 ug/mL-FEU — ABNORMAL HIGH (ref 0.00–0.48)

## 2011-12-06 LAB — PHENYTOIN LEVEL, TOTAL: Phenytoin Lvl: 4.6 ug/mL — ABNORMAL LOW (ref 10.0–20.0)

## 2011-12-06 LAB — CBC
MCH: 30.7 pg (ref 26.0–34.0)
Platelets: 317 10*3/uL (ref 150–400)
RBC: 3.87 MIL/uL — ABNORMAL LOW (ref 4.22–5.81)
RDW: 13.5 % (ref 11.5–15.5)
WBC: 7.7 10*3/uL (ref 4.0–10.5)

## 2011-12-06 LAB — GLUCOSE, CAPILLARY

## 2011-12-06 MED ORDER — IOHEXOL 300 MG/ML  SOLN
100.0000 mL | Freq: Once | INTRAMUSCULAR | Status: AC | PRN
  Administered 2011-12-06: 100 mL via INTRAVENOUS

## 2011-12-06 MED ORDER — MORPHINE SULFATE 4 MG/ML IJ SOLN
6.0000 mg | Freq: Once | INTRAMUSCULAR | Status: AC
  Administered 2011-12-06: 6 mg via INTRAVENOUS
  Filled 2011-12-06: qty 2

## 2011-12-06 MED ORDER — ASPIRIN 81 MG PO CHEW
324.0000 mg | CHEWABLE_TABLET | Freq: Once | ORAL | Status: AC
  Administered 2011-12-06: 324 mg via ORAL
  Filled 2011-12-06: qty 4

## 2011-12-06 NOTE — ED Notes (Signed)
MD at bedside. 

## 2011-12-06 NOTE — ED Notes (Signed)
S/P aortic/femoral bypass on 11/25/2011 by Dr. Arbie Cookey.  C/O sob x few days (since he's been home) and chest pain today.

## 2011-12-06 NOTE — ED Provider Notes (Addendum)
History     CSN: [id number]  Arrival date & time 12/06/11  1717   First MD Initiated Contact with Patient 12/06/11 1823      Chief Complaint  Patient presents with  . Shortness of Breath  . Chest Pain    (Consider location/radiation/quality/duration/timing/severity/associated sxs/prior treatment) HPI Complains of shortness of breath onset 2 days ago only has shortness of breath with exertion improved with rest denies any chest pain. No other complaint no other associated symptoms asymptomatic as I examine him. No other associated symptoms.7 Past Medical History  Diagnosis Date  . Diabetes mellitus   . Hyperlipidemia   . Hypertension   . Arthritis   . Leg pain   . Seizure disorder   . Myocardial infarction     "silent heart attack by patient"  . Peripheral vascular disease   . Bronchitis   . 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 Chronic kidney disease     hx UTI, and kidney stones  . Seizures     last seizure "years ago"  . Coronary artery disease     Past Surgical History  Procedure Date  . Thrombectomy     left to right fem-fem bypass  . Hernia repair   . Aortogram with iliac artery stenting 05/06/2011  . Tonsillectomy   . Aorta - bilateral femoral artery bypass graft 11/25/2011    Procedure: AORTA BIFEMORAL BYPASS GRAFT;  Surgeon: Larina Earthly, MD;  Location: [facility] OR;  Service: Vascular;  Laterality: N/A;   Status post AAA repair 11/25/2011 Family History  Problem Relation Age of Onset  . Hypertension Mother   . Heart attack Mother   . Diabetes Mother   . Aneurysm Father   . Heart attack Sister   . Heart disease Brother     heart transplant    History  Substance Use Topics  . Smoking status: Former Smoker -- 1.5 packs/day for 44 years    Types: Cigarettes    Quit date: 11/24/2011  . Smokeless tobacco: Not on file  . Alcohol Use: No     alcohol abuse in the past      Review of Systems    Constitutional: Negative.   HENT: Negative.   Respiratory: Positive for shortness of breath.   Cardiovascular: Negative.   Gastrointestinal: Negative.   Musculoskeletal: Negative.   Skin: Negative.   Neurological: Negative.   Hematological: Negative.   Psychiatric/Behavioral: Negative.     Allergies  Penicillins and Zetia  Home Medications   Current Outpatient Rx  Name Route Sig Dispense Refill  . ASPIRIN 81 MG PO TABS Oral Take 81 mg by mouth daily.      Marland Kitchen GABAPENTIN 300 MG PO CAPS Oral Take 1 capsule (300 mg total) by mouth 2 (two) times daily before a meal. 60 capsule 2  . GLIPIZIDE 10 MG PO TABS Oral Take 10 mg by mouth 2 (two) times daily before a meal.      . METFORMIN HCL 500 MG PO TABS Oral Take 500 mg by mouth 2 (two) times daily with a meal.      . ADULT MULTIVITAMIN W/MINERALS CH Oral Take 1 tablet by mouth daily.      . OXYCODONE HCL 5 MG PO TABS Oral Take 1 tablet (5 mg total) by mouth every 4 (four) hours as needed for pain. For pain 30 tablet 0  . PHENYTOIN SODIUM EXTENDED 100 MG PO CAPS  Oral Take 200-300 mg by mouth 2 (two) times daily. Take 200 mg in the morning & 300 mg in the evening.    . TURMERIC (CURCUMA LONGA) POWD Oral Take 1 tablet by mouth daily.       BP 145/92  Pulse 79  Temp(Src) 98.3 F (36.8 C) (Oral)  Resp 18  Ht 6\' 3"  (1.905 m)  Wt 158 lb (71.668 kg)  BMI 19.75 kg/m2  SpO2 100%  Physical Exam  Constitutional: He appears well-developed and well-nourished.  HENT:  Head: Normocephalic and atraumatic.  Eyes: Conjunctivae are normal. Pupils are equal, round, and reactive to light.  Neck: Neck supple. No tracheal deviation present. No thyromegaly present.  Cardiovascular: Normal rate and regular rhythm.   No murmur heard. Pulmonary/Chest: Effort normal and breath sounds normal.  Abdominal: Soft. Bowel sounds are normal. He exhibits no distension. There is no tenderness.  Musculoskeletal: Normal range of motion. He exhibits no edema and no  tenderness.  Neurological: He is alert. Coordination normal.  Skin: Skin is warm and dry. No rash noted.  Psychiatric: He has a normal mood and affect.    Date: 12/06/2011  Rate: 75  Rhythm: normal sinus rhythm  QRS Axis: normal  Intervals: normal  ST/T Wave abnormalities: nonspecific T wave changes  Conduction Disutrbances:none  Narrative Interpretation:   Old EKG Reviewed: unchanged Unchanged from12/7/12  ED Course  Procedures (including critical care time) 9:50 PM complains of low bowel pain at site of sutures morphine ordered Labs Reviewed - No data to display No results found.  1130 pm pain much improved.alert and ambulatory. No diagnosis found.  Results for orders placed during the hospital encounter of 12/06/11  CBC      Component Value Range   WBC 7.7  4.0 - 10.5 (K/uL)   RBC 3.87 (*) 4.22 - 5.81 (MIL/uL)   Hemoglobin 11.9 (*) 13.0 - 17.0 (g/dL)   HCT 16.1 (*) 09.6 - 52.0 (%)   MCV 92.8  78.0 - 100.0 (fL)   MCH 30.7  26.0 - 34.0 (pg)   MCHC 33.1  30.0 - 36.0 (g/dL)   RDW 04.5  40.9 - 81.1 (%)   Platelets 317  150 - 400 (K/uL)  BASIC METABOLIC PANEL      Component Value Range   Sodium 136  135 - 145 (mEq/L)   Potassium 3.9  3.5 - 5.1 (mEq/L)   Chloride 99  96 - 112 (mEq/L)   CO2 28  19 - 32 (mEq/L)   Glucose, Bld 100 (*) 70 - 99 (mg/dL)   BUN 10  6 - 23 (mg/dL)   Creatinine, Ser 9.14  0.50 - 1.35 (mg/dL)   Calcium 78.2  8.4 - 10.5 (mg/dL)   GFR calc non Af Amer >90  >90 (mL/min)   GFR calc Af Amer >90  >90 (mL/min)  PHENYTOIN LEVEL, TOTAL      Component Value Range   Phenytoin Lvl 4.6 (*) 10.0 - 20.0 (ug/mL)  D-DIMER, QUANTITATIVE      Component Value Range   D-Dimer, Quant 2.97 (*) 0.00 - 0.48 (ug/mL-FEU)  POCT I-STAT TROPONIN I      Component Value Range   Troponin i, poc 0.00  0.00 - 0.08 (ng/mL)   Comment 3           GLUCOSE, CAPILLARY      Component Value Range   Glucose-Capillary 115 (*) 70 - 99 (mg/dL)   Comment 1 Notify RN    POCT I-STAT  TROPONIN I  Component Value Range   Troponin i, poc 0.01  0.00 - 0.08 (ng/mL)   Comment 3            Dg Chest 2 View  11/11/2011  *RADIOLOGY REPORT*  Clinical Data: Preoperative cardiopulmonary evaluation.  CHEST - 2 VIEW  Comparison: None.  Findings: Cardiac silhouette is upper range of normal size.  No hilar or mediastinal lesions are seen.  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 No pulmonary edema, pneumonia, or pleural effusion is seen.  There is very minimal degenerative spondylosis compatible with age.  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 No pulmonary edema, pneumonia, or pleural effusion is seen.  Original Report Authenticated By: Crawford Givens, M.D.   Ct Angio Chest W/cm &/or Wo Cm  12/06/2011  *RADIOLOGY REPORT*  Clinical Data: Shortness of breath, chest pain, question pulmonary embolism; recent aorta bifemoral bypass grafting 11/25/2011, history diabetes, hyperlipidemia, hypertension, MI  CT ANGIOGRAPHY CHEST  Technique:  Multidetector CT imaging of the chest using the standard protocol during bolus administration of intravenous contrast. Multiplanar reconstructed images including MIPs were obtained and reviewed to evaluate the vascular anatomy.  Contrast: OMNIPAQUE IOHEXOL 300 MG/ML IV SOLN  Comparison: None  Findings: Aorta normal caliber with minimal thrombus at arch. No evidence of aneurysm or dissection. Pulmonary arteries patent. No evidence of pulmonary embolism. Visualized portion upper abdomen unremarkable. No thoracic adenopathy. Question few calcified mediastinal and hilar lymph nodes. Scattered emphysematous changes with dependent atelectasis at the lower lobes posteriorly. No pulmonary infiltrate, pleural effusion, or pneumothorax. No acute osseous findings.  IMPRESSION: No evidence pulmonary embolism. Dependent atelectasis. Emphysematous and granulomatous disease changes.   Original Report Authenticated By: Lollie Marrow, M.D.   Dg Chest Port 1 View  12/06/2011  *RADIOLOGY REPORT*  Clinical Data: Shortness of breath.  PORTABLE CHEST - 1 VIEW  Comparison: 11/26/2011  Findings: Cardiomediastinal silhouette is within normal limits. The lungs are free of focal consolidations and pleural effusions. No edema. Old right-sided rib fractures.  IMPRESSION: Negative exam.  Original Report Authenticated By: Patterson Hammersmith, M.D.   Dg Chest Portable 1 View  11/26/2011  *RADIOLOGY REPORT*  Clinical Data: Postop aortic valve.  PORTABLE CHEST - 1 VIEW  Comparison: 11/25/2011 and 11/11/2011  Findings: Nasogastric tube courses into the stomach and continues below the edge of the image.  Right IJ sheath terminates in the proximal superior vena cava.  Lung volumes are low with streaky bibasilar atelectasis.  Heart and mediastinal contours are stable.  Pulmonary vascularity appears within normal limits.  No visible pleural effusion or pneumothorax.  IMPRESSION: Low lung volumes with bibasilar atelectasis.  No significant change in aeration compared to 11/25/2011.  Original Report Authenticated By: Britta Mccreedy, M.D.   Dg Chest Portable 1 View  11/25/2011  *RADIOLOGY REPORT*  Clinical Data: 61 year old male status post Swan placement.  PORTABLE CHEST - 1 VIEW  Comparison: 11/11/2011.  Findings: Portable semi upright AP view at 1348 hours.  Right IJ approach Swan-Ganz catheter traverses the heart and pulmonary outflow tract and terminates at the right lung base.  This is about 4 cm distal to the level of the right hilum.  Lower lung volumes.  Enteric tube in place courses to the left upper quadrant.  No pneumothorax.  Crowding of lung markings without overt edema.  No pleural effusion or consolidation.  Stable cardiac size and mediastinal contours.  IMPRESSION: 1.  Right IJ approach Swan-Ganz catheter appears wedged at  the right lung base.  This is about 4 cm distal to the right main pulmonary  artery level. 2.  Lower lung volumes, no other acute cardiopulmonary abnormality.  Original Report Authenticated By: Harley Hallmark, M.D.    MDM  Anginal equivalent or remote possibility,, however self-limiting. Patient has no evidence of acute coronary syndrome on laboratory work or EKG; no evidence of pulmonary embolism; blood constant improved since discharge from hospital Case discussed with Dr.Balfour Plan discharge to home Followup at [place] [facility] next week.. Diagnosis #1 exertional dyspnea #2 subtherapeutic Dilantin level       Doug Sou, MD 12/07/11 0001  Doug Sou, MD 12/07/11 1610

## 2011-12-06 NOTE — ED Notes (Signed)
WUJ:[id number]<JY> Expected date:<BR> Expected time:<BR> Means of arrival:<BR> Comments:<BR> For triage 4

## 2011-12-07 MED ORDER — PHENYTOIN SODIUM EXTENDED 100 MG PO CAPS
500.0000 mg | ORAL_CAPSULE | Freq: Once | ORAL | Status: AC
  Administered 2011-12-07: 500 mg via ORAL
  Filled 2011-12-07: qty 5

## 2011-12-16 ENCOUNTER — Encounter: Payer: Self-pay | Admitting: Vascular Surgery

## 2011-12-17 ENCOUNTER — Encounter: Payer: Self-pay | Admitting: Vascular Surgery

## 2011-12-17 ENCOUNTER — Ambulatory Visit (INDEPENDENT_AMBULATORY_CARE_PROVIDER_SITE_OTHER): Payer: Medicaid Other | Admitting: Vascular Surgery

## 2011-12-17 VITALS — BP 165/113 | HR 78 | Resp 16 | Ht 75.0 in | Wt 153.0 lb

## 2011-12-17 DIAGNOSIS — I714 Abdominal aortic aneurysm, without rupture: Secondary | ICD-10-CM

## 2011-12-17 NOTE — Progress Notes (Signed)
The patient has today for followup of his aortobifemoral bypass for occlusive disease 3 weeks ago. He was discharged after proximal and one week in the hospital. He still reports soreness and pain. He has lost 10 pounds and is slowly recovering his preoperative stamina. He reports that he is having return of appetite and bowel function. He is no longer having rest pain in his right foot and is walking without difficulty.  Past Medical History  Diagnosis Date  . Diabetes mellitus   . Hyperlipidemia   . Hypertension   . Arthritis   . Leg pain   . Seizure disorder   . Myocardial infarction     "silent heart attack by patient"  . Peripheral vascular disease   . Bronchitis   . 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 Chronic kidney disease     hx UTI, and kidney stones  . Seizures     last seizure "years ago"  . Coronary artery disease     History  Substance Use Topics  . Smoking status: Former Smoker -- 1.5 packs/day for 44 years    Types: Cigarettes    Quit date: 11/24/2011  . Smokeless tobacco: Not on file  . Alcohol Use: No     alcohol abuse in the past    Family History  Problem Relation Age of Onset  . Hypertension Mother   . Heart attack Mother   . Diabetes Mother   . Aneurysm Father   . Heart attack Sister   . Heart disease Brother     heart transplant    Allergies  Allergen Reactions  . Penicillins Swelling  . Zetia (Ezetimibe) Swelling    Aches all over, short of breath    Current outpatient prescriptions:aspirin 81 MG tablet, Take 81 mg by mouth daily.  , Disp: , Rfl: ;  Bisacodyl (LAXATIVE PO), Take by mouth., Disp: , Rfl: ;  gabapentin (NEURONTIN) 300 MG capsule, Take 1 capsule (300 mg total) by mouth 2 (two) times daily before a meal., Disp: 60 capsule, Rfl: 2;  glipiZIDE (GLUCOTROL) 10 MG tablet, Take 10 mg by mouth 2 (two) times daily before a meal.  , Disp: , Rfl:  metFORMIN (GLUCOPHAGE) 500 MG tablet, Take  500 mg by mouth 2 (two) times daily with a meal.  , Disp: , Rfl: ;  Multiple Vitamin (MULITIVITAMIN WITH MINERALS) TABS, Take 1 tablet by mouth daily.  , Disp: , Rfl: ;  phenytoin (DILANTIN) 100 MG ER capsule, Take 200-300 mg by mouth 2 (two) times daily. Take 200 mg in the morning & 300 mg in the evening., Disp: , Rfl: ;  Turmeric, Curcuma [place], POWD, Take 1 tablet by mouth daily. , Disp: , Rfl:   BP 165/113  Pulse 78  Resp 16  Ht 6\' 3"  (1.905 m)  Wt 153 lb (69.4 kg)  BMI 19.12 kg/m2  SpO2 99%  Body mass index is 19.12 kg/(m^2).       Physical exam abdominal wound healing nicely with no evidence of hernia 2+ femoral pulses bilaterally with well-healed femoral incisions bilaterally  Assessment and plan normal recovery following aortofemoral bypass grafting. He was given an additional prescription for Tylox #40 no refills. I will see him again in 2 months for continued followup

## 2011-12-30 LAB — CREATININE, SERUM
Creatinine, Ser: 0.84 mg/dL (ref 0.50–1.35)
GFR calc Af Amer: >90 mL/min (ref >90)
GFR calc non Af Amer: >90 mL/min (ref >90)

## 2012-01-01 ENCOUNTER — Encounter (HOSPITAL_COMMUNITY): Payer: Self-pay | Admitting: *Deleted

## 2012-01-01 ENCOUNTER — Emergency Department (HOSPITAL_COMMUNITY)
Admission: EM | Admit: 2012-01-01 | Discharge: 2012-01-01 | Disposition: A | Discharge status: Home / Self Care | Payer: Medicaid Other | Attending: Emergency Medicine | Admitting: Emergency Medicine

## 2012-01-01 DIAGNOSIS — I252 Old myocardial infarction: Secondary | ICD-10-CM | POA: Insufficient documentation

## 2012-01-01 DIAGNOSIS — Z79899 Other long term (current) drug therapy: Secondary | ICD-10-CM | POA: Insufficient documentation

## 2012-01-01 DIAGNOSIS — M129 Arthropathy, unspecified: Secondary | ICD-10-CM | POA: Insufficient documentation

## 2012-01-01 DIAGNOSIS — IMO0002 Reserved for concepts with insufficient information to code with codable children: Secondary | ICD-10-CM | POA: Insufficient documentation

## 2012-01-01 DIAGNOSIS — E119 Type 2 diabetes mellitus without complications: Secondary | ICD-10-CM | POA: Insufficient documentation

## 2012-01-01 DIAGNOSIS — E785 Hyperlipidemia, unspecified: Secondary | ICD-10-CM | POA: Insufficient documentation

## 2012-01-01 DIAGNOSIS — X500XXA Overexertion from strenuous movement or load, initial encounter: Secondary | ICD-10-CM | POA: Insufficient documentation

## 2012-01-01 DIAGNOSIS — Z7982 Long term (current) use of aspirin: Secondary | ICD-10-CM | POA: Insufficient documentation

## 2012-01-01 DIAGNOSIS — G40909 Epilepsy, unspecified, not intractable, without status epilepticus: Secondary | ICD-10-CM | POA: Insufficient documentation

## 2012-01-01 DIAGNOSIS — I2510 Atherosclerotic heart disease of native coronary artery without angina pectoris: Secondary | ICD-10-CM | POA: Insufficient documentation

## 2012-01-01 DIAGNOSIS — S39011A Strain of muscle, fascia and tendon of abdomen, initial encounter: Secondary | ICD-10-CM

## 2012-01-01 DIAGNOSIS — I10 Essential (primary) hypertension: Secondary | ICD-10-CM | POA: Insufficient documentation

## 2012-01-01 DIAGNOSIS — R1033 Periumbilical pain: Secondary | ICD-10-CM | POA: Insufficient documentation

## 2012-01-01 LAB — GLUCOSE, CAPILLARY: Glucose-Capillary: 154 mg/dL — ABNORMAL HIGH (ref 70–99)

## 2012-01-01 MED ORDER — METHOCARBAMOL 500 MG PO TABS: 500.0000 mg | ORAL_TABLET | Freq: Two times a day (BID) | ORAL | Status: DC

## 2012-01-01 MED ORDER — METHOCARBAMOL 500 MG PO TABS: 500.0000 mg | ORAL_TABLET | Freq: Two times a day (BID) | ORAL | Status: AC

## 2012-01-01 MED ORDER — OXYCODONE-ACETAMINOPHEN 5-325 MG PO TABS: 2.0000 | ORAL_TABLET | ORAL | Status: AC | PRN

## 2012-01-01 NOTE — ED Provider Notes (Signed)
History     CSN: [id number]  Arrival date & time 01/01/12  1059   First MD Initiated Contact with Patient 01/01/12 1337      Chief Complaint  Patient presents with  . Abdominal Pain    (Consider location/radiation/quality/duration/timing/severity/associated sxs/prior treatment) Patient is a 61 y.o. male presenting with abdominal pain. The history is provided by the patient.  Abdominal Pain The primary symptoms of the illness include abdominal pain.   complaint abdominal pain which started after he pushed a car yesterday. Denies any fever, vomiting, diarrhea. Pain is described as sharp and localized to his mid rectus abdominis. No medications taken prior to arrival. He denies any syncope associated with this. Recent surgery on January 1 for a vascular procedure. Denies any lower jimmy numbness or weakness  Past Medical History  Diagnosis Date  . Diabetes mellitus   . Hyperlipidemia   . Hypertension   . Arthritis   . Leg pain   . Seizure disorder   . Myocardial infarction     "silent heart attack by patient"  . Peripheral vascular disease   . Bronchitis   . 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 Chronic kidney disease     hx UTI, and kidney stones  . Seizures     last seizure "years ago"  . Coronary artery disease     Past Surgical History  Procedure Date  . Thrombectomy     left to right fem-fem bypass  . Hernia repair   . Aortogram with iliac artery stenting 05/06/2011  . Tonsillectomy   . Aorta - bilateral femoral artery bypass graft 11/25/2011    Procedure: AORTA BIFEMORAL BYPASS GRAFT;  Surgeon: Larina Earthly, MD;  Location: [facility] OR;  Service: Vascular;  Laterality: N/A;  . Pr vein bypass graft,aorto-fem-pop 11/24/12    Family History  Problem Relation Age of Onset  . Hypertension Mother   . Heart attack Mother   . Diabetes Mother   . Aneurysm Father   . Heart attack Sister   . Heart disease Brother     heart  transplant    History  Substance Use Topics  . Smoking status: Former Smoker -- 1.5 packs/day for 44 years    Types: Cigarettes    Quit date: 11/24/2011  . Smokeless tobacco: Not on file  . Alcohol Use: No     alcohol abuse in the past      Review of Systems  Gastrointestinal: Positive for abdominal pain.  All other systems reviewed and are negative.    Allergies  Penicillins and Zetia  Home Medications   Current Outpatient Rx  Name Route Sig Dispense Refill  . ASPIRIN 81 MG PO TABS Oral Take 81 mg by mouth daily.      Marland Kitchen GABAPENTIN 300 MG PO CAPS Oral Take 300 mg by mouth 2 (two) times daily as needed. FOR PAIN    . GLIPIZIDE 10 MG PO TABS Oral Take 10 mg by mouth 2 (two) times daily before a meal.      . METFORMIN HCL 500 MG PO TABS Oral Take 500 mg by mouth 2 (two) times daily with a meal.      . ADULT MULTIVITAMIN W/MINERALS CH Oral Take 1 tablet by mouth daily.      Marland Kitchen PHENYTOIN SODIUM EXTENDED 100 MG PO CAPS Oral Take 200-300 mg by mouth 2 (two) times daily. Take 200 mg in the morning &  300 mg in the evening.    . TURMERIC (CURCUMA LONGA) POWD Oral Take 1 tablet by mouth daily.       BP 140/97  Pulse 81  Temp(Src) 98 F (36.7 C) (Oral)  Resp 18  Ht 6\' 3"  (1.905 m)  Wt 163 lb (73.936 kg)  BMI 20.37 kg/m2  SpO2 97%  Physical Exam  Nursing note and vitals reviewed. Constitutional: He is oriented to person, place, and time. He appears well-developed and well-nourished.  Non-toxic appearance. No distress.  HENT:  Head: Normocephalic and atraumatic.  Eyes: Conjunctivae, EOM and lids are normal. Pupils are equal, round, and reactive to light.  Neck: Normal range of motion. Neck supple. No tracheal deviation present. No mass present.  Cardiovascular: Normal rate, regular rhythm and normal heart sounds.  Exam reveals no gallop.   No murmur heard. Pulmonary/Chest: Effort normal and breath sounds normal. No stridor. No respiratory distress. He has no decreased breath  sounds. He has no wheezes. He has no rhonchi. He has no rales.  Abdominal: Soft. Normal appearance and bowel sounds are normal. He exhibits no distension. There is no tenderness. There is no rebound and no CVA tenderness.    Musculoskeletal: Normal range of motion. He exhibits no edema and no tenderness.  Neurological: He is alert and oriented to person, place, and time. He has normal strength. No cranial nerve deficit or sensory deficit. GCS eye subscore is 4. GCS verbal subscore is 5. GCS motor subscore is 6.  Skin: Skin is warm and dry. No abrasion and no rash noted.  Psychiatric: He has a normal mood and affect. His speech is normal and behavior is normal.    ED Course  Procedures (including critical care time)  Labs Reviewed  GLUCOSE, CAPILLARY - Abnormal; Notable for the following:    Glucose-Capillary 154 (*)    All other components within normal limits   No results found.   No diagnosis found.    MDM  Patient given pain medications suspect that he has a abdominal wall strain. No signs of surgical abdomen        Toy Baker, MD 01/01/12 1346

## 2012-01-01 NOTE — ED Notes (Signed)
Pt had abdominal surgery on 11/26/11.  He had "aortic femoral bypass".  Yesterday he pushed a car and woke up this morning with abdominal pain.  Abdomen is soft and non tender to touch.  Pt states that he has a "stabbing pain with movement".  No n/v or weakness with this.  No syncope.  Pt is alert and oriented in triage

## 2012-01-20 ENCOUNTER — Encounter: Payer: Self-pay | Admitting: Vascular Surgery

## 2012-01-21 ENCOUNTER — Ambulatory Visit (INDEPENDENT_AMBULATORY_CARE_PROVIDER_SITE_OTHER): Payer: Medicaid Other | Admitting: Vascular Surgery

## 2012-01-21 ENCOUNTER — Encounter: Payer: Self-pay | Admitting: Vascular Surgery

## 2012-01-21 VITALS — BP 152/94 | HR 60 | Resp 20 | Ht 75.0 in | Wt 164.0 lb

## 2012-01-21 DIAGNOSIS — I70219 Atherosclerosis of native arteries of extremities with intermittent claudication, unspecified extremity: Secondary | ICD-10-CM

## 2012-01-21 NOTE — Progress Notes (Signed)
The patient presents today for continued followup of his aorta bifemoral bypass grafting in January. This was for occlusive disease. He reports that he was pushing his car and noted sustaining sensation in his abdomen was concern regarding this. He does have some tingling in his left leg but no arterial rest pain and no claudication.  Physical exam: Abdominal wound well healed with no evidence of ventral hernia area and a well-healed and femoral incisions bilaterally with 2+ femoral pulses. He has absent popliteal and distal pulses with known SFA occlusion.  Impression and plan stable continued followup after aorta bifemoral bypass grafting. He will continue to prevent straining on his abdomen for a total of 3 months after surgery we'll see him again in 2 months. He is requesting pain medication. I explained that we will not we'll do continue to keep him on pain medication he feels that he needs this in the evening to help sleep. Was given a prescription for Tylox #30 no refills and was told this would be the last narcotic meds for this procedure

## 2012-02-11 ENCOUNTER — Ambulatory Visit: Payer: Medicaid Other | Admitting: Vascular Surgery

## 2012-02-17 LAB — CREATININE, SERUM
Creatinine, Ser: 0.99 mg/dL (ref 0.50–1.35)
GFR calc non Af Amer: 88 mL/min — ABNORMAL LOW (ref >90)

## 2012-03-16 ENCOUNTER — Encounter: Payer: Self-pay | Admitting: Vascular Surgery

## 2012-03-17 ENCOUNTER — Ambulatory Visit (INDEPENDENT_AMBULATORY_CARE_PROVIDER_SITE_OTHER): Payer: Medicaid Other | Admitting: Vascular Surgery

## 2012-03-17 ENCOUNTER — Encounter: Payer: Self-pay | Admitting: Vascular Surgery

## 2012-03-17 VITALS — BP 149/94 | HR 64 | Resp 18 | Ht 75.0 in | Wt 166.9 lb

## 2012-03-17 DIAGNOSIS — I70219 Atherosclerosis of native arteries of extremities with intermittent claudication, unspecified extremity: Secondary | ICD-10-CM

## 2012-03-17 NOTE — Progress Notes (Signed)
The patient to stay for followup of his aortobifemoral bypass done in January of 2013. He continues to do well following surgery. He reports that his legs feel the best refill 40 years. Did have a fall last week and reports that since his fall he has had a toothache-type discomfort from his mid back through both legs down to his feet. He has been able to walk without difficulty since this. On physical exam he does have a well-healed abdominal incision with no evidence of hernia. He has 2+ femoral pulses bilaterally. He has known superficial femoral artery occlusive disease. He does not have palpable popliteal or pedal pulses. His feet are warm and well perfused. He will continue his walking program without limitation. He is requesting narcotic pain pills today and I explained that I am not able to provide the since he does not have any vascular etiology pain. I explained he does continue to have difficulty may require orthopedic evaluation following his fall. He does not have any deformities or obvious injury resulting from this. He will see [place] on an as-needed basis

## 2012-04-06 ENCOUNTER — Encounter: Payer: Self-pay | Admitting: Neurosurgery

## 2012-04-07 ENCOUNTER — Ambulatory Visit: Payer: Self-pay | Admitting: Neurosurgery

## 2012-05-24 ENCOUNTER — Emergency Department (HOSPITAL_COMMUNITY)
Admission: EM | Admit: 2012-05-24 | Discharge: 2012-05-24 | Disposition: A | Discharge status: Home / Self Care | Payer: Medicaid Other | Attending: Emergency Medicine | Admitting: Emergency Medicine

## 2012-05-24 ENCOUNTER — Emergency Department (HOSPITAL_COMMUNITY): Payer: Medicaid Other

## 2012-05-24 ENCOUNTER — Encounter (HOSPITAL_COMMUNITY): Payer: Self-pay | Admitting: Emergency Medicine

## 2012-05-24 DIAGNOSIS — R0602 Shortness of breath: Secondary | ICD-10-CM

## 2012-05-24 DIAGNOSIS — M542 Cervicalgia: Secondary | ICD-10-CM | POA: Insufficient documentation

## 2012-05-24 DIAGNOSIS — I252 Old myocardial infarction: Secondary | ICD-10-CM | POA: Insufficient documentation

## 2012-05-24 DIAGNOSIS — F17200 Nicotine dependence, unspecified, uncomplicated: Secondary | ICD-10-CM | POA: Insufficient documentation

## 2012-05-24 DIAGNOSIS — E785 Hyperlipidemia, unspecified: Secondary | ICD-10-CM | POA: Insufficient documentation

## 2012-05-24 DIAGNOSIS — Z79899 Other long term (current) drug therapy: Secondary | ICD-10-CM | POA: Insufficient documentation

## 2012-05-24 DIAGNOSIS — I2510 Atherosclerotic heart disease of native coronary artery without angina pectoris: Secondary | ICD-10-CM | POA: Insufficient documentation

## 2012-05-24 DIAGNOSIS — Z87442 Personal history of urinary calculi: Secondary | ICD-10-CM | POA: Insufficient documentation

## 2012-05-24 DIAGNOSIS — R51 Headache: Secondary | ICD-10-CM | POA: Insufficient documentation

## 2012-05-24 DIAGNOSIS — E119 Type 2 diabetes mellitus without complications: Secondary | ICD-10-CM | POA: Insufficient documentation

## 2012-05-24 DIAGNOSIS — I10 Essential (primary) hypertension: Secondary | ICD-10-CM | POA: Insufficient documentation

## 2012-05-24 LAB — LIPASE, BLOOD: Lipase: 28 U/L (ref 11–59)

## 2012-05-24 LAB — CBC WITH DIFFERENTIAL/PLATELET
Basophils Relative: 1 % (ref 0–1)
Eosinophils Absolute: 0.2 10*3/uL (ref 0.0–0.7)
Hemoglobin: 14.7 g/dL (ref 13.0–17.0)
MCH: 31.1 pg (ref 26.0–34.0)
MCHC: 34.4 g/dL (ref 30.0–36.0)
Monocytes Relative: 9 % (ref 3–12)
Neutrophils Relative %: 50 % (ref 43–77)

## 2012-05-24 LAB — HEPATIC FUNCTION PANEL
AST: 21 U/L (ref 0–37)
Bilirubin, Direct: <0.1 mg/dL (ref 0.0–0.3)

## 2012-05-24 LAB — POCT I-STAT TROPONIN I: Troponin i, poc: 0 ng/mL (ref 0.00–0.08)

## 2012-05-24 LAB — BASIC METABOLIC PANEL
BUN: 11 mg/dL (ref 6–23)
Calcium: 9.9 mg/dL (ref 8.4–10.5)
Creatinine, Ser: 0.62 mg/dL (ref 0.50–1.35)
GFR calc Af Amer: >90 mL/min (ref >90)
GFR calc non Af Amer: >90 mL/min (ref >90)
Potassium: 3.9 mEq/L (ref 3.5–5.1)

## 2012-05-24 MED ORDER — ASPIRIN 81 MG PO CHEW
324.0000 mg | CHEWABLE_TABLET | Freq: Once | ORAL | Status: DC
  Filled 2012-05-24: qty 4

## 2012-05-24 NOTE — ED Provider Notes (Signed)
History     CSN: [id number]  Arrival date & time 05/24/12  1401   First MD Initiated Contact with Patient 05/24/12 1512      Chief Complaint  Patient presents with  . Headache  . Shortness of Breath  . Neck Pain    (Consider location/radiation/quality/duration/timing/severity/associated sxs/prior treatment) Patient is a 61 y.o. male presenting with shortness of breath. The history is provided by the patient.  Shortness of Breath  The current episode started today. The onset was sudden. The problem has been gradually improving. The problem is moderate. Nothing relieves the symptoms. Nothing aggravates the symptoms. Associated symptoms include shortness of breath. Pertinent negatives include no chest pain, no chest pressure, no fever, no sore throat, no cough and no wheezing.  Pt states he was laying down when he began feeling short of breath. States he could not get enough air. States this has happened several times now over the last week. States also has had intermittent headaches, stomach aches, generalized malaise. Denies fever, chills, cough, nausea, vomiting. States has had a aortic to fem fem bipass few months ago, no problems since that surgery. 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 No other complaints. States since he has been here his shortness of breath has resolved. He denies exertional SOB or chest pain. Denies nausea, diaphoresis, dizziness during his episode. States "my stomach feels funny because i ate some garlic earlier."  Past Medical History  Diagnosis Date  . Diabetes mellitus   . Hyperlipidemia   . Hypertension   . Arthritis   . Leg pain   . Seizure disorder   . Myocardial infarction     "silent heart attack by patient"  . Peripheral vascular disease   . Bronchitis   . 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 Seizures     last  seizure "years ago"  . Coronary artery disease   . Chronic kidney disease     hx UTI, and kidney stones    Past Surgical History  Procedure Date  . Thrombectomy     left to right fem-fem bypass  . Hernia repair   . Aortogram with iliac artery stenting 05/06/2011  . Tonsillectomy   . Aorta - bilateral femoral artery bypass graft 11/25/2011    Procedure: AORTA BIFEMORAL BYPASS GRAFT;  Surgeon: Larina Earthly, MD;  Location: [facility] OR;  Service: Vascular;  Laterality: N/A;  . Pr vein bypass graft,aorto-fem-pop 11/24/12    Family History  Problem Relation Age of Onset  . Hypertension Mother   . Heart attack Mother   . Diabetes Mother   . Aneurysm Father   . Heart attack Sister   . Heart disease Brother     heart transplant    History  Substance Use Topics  . Smoking status: Current Everyday Smoker -- 1.0 packs/day for 44 years    Types: Cigarettes  . Smokeless tobacco: Not on file  . Alcohol Use: No     alcohol abuse in the past      Review of Systems  Constitutional: Negative for fever and chills.  HENT: Positive for neck pain. Negative for sore throat.   Respiratory: Positive for shortness of breath. Negative for cough, chest tightness and wheezing.   Cardiovascular: Negative for chest pain, palpitations and leg swelling.  Gastrointestinal: Positive for abdominal pain. Negative for nausea, vomiting,  diarrhea and constipation.  Genitourinary: Negative for dysuria and flank pain.  Musculoskeletal: Negative for back pain.  Skin: Negative.   Neurological: Positive for headaches. Negative for dizziness, seizures, weakness and light-headedness.    Allergies  Penicillins and Zetia  Home Medications   Current Outpatient Rx  Name Route Sig Dispense Refill  . ASPIRIN EC 81 MG PO TBEC Oral Take 81 mg by mouth daily.    Marland Kitchen GABAPENTIN 300 MG PO CAPS Oral Take 300 mg by mouth 2 (two) times daily as needed. FOR PAIN    . GLIPIZIDE 10 MG PO TABS Oral Take 10 mg by mouth 2 (two)  times daily before a meal.      . METFORMIN HCL 500 MG PO TABS Oral Take 500 mg by mouth 2 (two) times daily with a meal.      . ADULT MULTIVITAMIN W/MINERALS CH Oral Take 1 tablet by mouth daily.      Marland Kitchen PHENYTOIN SODIUM EXTENDED 100 MG PO CAPS Oral Take 200-300 mg by mouth 2 (two) times daily. Take 200 mg in the morning & 300 mg in the evening.    . TURMERIC (CURCUMA LONGA) POWD Oral Take 1 tablet by mouth daily.       BP 136/103  Pulse 72  Temp 98.6 F (37 C) (Oral)  Resp 18  SpO2 99%  Physical Exam  Nursing note and vitals reviewed. Constitutional: He is oriented to person, place, and time. He appears well-developed and well-nourished. No distress.  HENT:  Head: Normocephalic.  Eyes: Conjunctivae are normal. Pupils are equal, round, and reactive to light.  Neck: Normal range of motion. Neck supple.  Cardiovascular: Normal rate, regular rhythm and normal heart sounds.   Pulmonary/Chest: Effort normal and breath sounds normal. No respiratory distress. He has no wheezes. He has no rales. He exhibits no tenderness.  Abdominal: Soft. Bowel sounds are normal. He exhibits no distension. There is no tenderness. There is no rebound.       Large vertical healed surgical scar from prior surgery  Musculoskeletal: Normal range of motion. He exhibits no edema and no tenderness.  Neurological: He is alert and oriented to person, place, and time.  Skin: Skin is warm and dry.  Psychiatric: He has a normal mood and affect.    ED Course  Procedures (including critical care time)   Date: 05/24/2012  Rate: 72  Rhythm: normal sinus rhythm  QRS Axis: normal  Intervals: normal  ST/T Wave abnormalities: normal  Conduction Disutrbances:none  Narrative Interpretation:   Old EKG Reviewed: unchanged   ECG unremarkable. Pt with known CAD, managed medically. Currently symptom free. Labs, CXR pending.   Results for orders placed during the hospital encounter of 05/24/12  CBC WITH DIFFERENTIAL       Component Value Range   WBC 6.4  4.0 - 10.5 K/uL   RBC 4.72  4.22 - 5.81 MIL/uL   Hemoglobin 14.7  13.0 - 17.0 g/dL   HCT 16.1  09.6 - 04.5 %   MCV 90.5  78.0 - 100.0 fL   MCH 31.1  26.0 - 34.0 pg   MCHC 34.4  30.0 - 36.0 g/dL   RDW 40.9  81.1 - 91.4 %   Platelets 151  150 - 400 K/uL   Neutrophils Relative 50  43 - 77 %   Neutro Abs 3.2  1.7 - 7.7 K/uL   Lymphocytes Relative 37  12 - 46 %   Lymphs Abs 2.3  0.7 - 4.0 K/uL  Monocytes Relative 9  3 - 12 %   Monocytes Absolute 0.6  0.1 - 1.0 K/uL   Eosinophils Relative 4  0 - 5 %   Eosinophils Absolute 0.2  0.0 - 0.7 K/uL   Basophils Relative 1  0 - 1 %   Basophils Absolute 0.0  0.0 - 0.1 K/uL  BASIC METABOLIC PANEL      Component Value Range   Sodium 135  135 - 145 mEq/L   Potassium 3.9  3.5 - 5.1 mEq/L   Chloride 98  96 - 112 mEq/L   CO2 25  19 - 32 mEq/L   Glucose, Bld 65 (*) 70 - 99 mg/dL   BUN 11  6 - 23 mg/dL   Creatinine, Ser 1.61  0.50 - 1.35 mg/dL   Calcium 9.9  8.4 - 09.6 mg/dL   GFR calc non Af Amer >90  >90 mL/min   GFR calc Af Amer >90  >90 mL/min  HEPATIC FUNCTION PANEL      Component Value Range   Total Protein 7.4  6.0 - 8.3 g/dL   Albumin 4.1  3.5 - 5.2 g/dL   AST 21  0 - 37 U/L   ALT 18  0 - 53 U/L   Alkaline Phosphatase 84  39 - 117 U/L   Total Bilirubin 0.2 (*) 0.3 - 1.2 mg/dL   Bilirubin, Direct <0.4  0.0 - 0.3 mg/dL   Indirect Bilirubin NOT CALCULATED  0.3 - 0.9 mg/dL  LIPASE, BLOOD      Component Value Range   Lipase 28  11 - 59 U/L  POCT I-STAT TROPONIN I      Component Value Range   Troponin i, poc 0.00  0.00 - 0.08 ng/mL   Comment 3           PHENYTOIN LEVEL, TOTAL      Component Value Range   Phenytoin Lvl 10.7  10.0 - 20.0 ug/mL   Dg Chest 2 View  05/24/2012  *RADIOLOGY REPORT*  Clinical Data: Headache, shortness of breath  CHEST - 2 VIEW  Comparison: 12/06/2011  Findings: Mild hyperinflation. Lungs clear.  Heart size and pulmonary vascularity normal.  No effusion.  Visualized bones  unremarkable.  IMPRESSION: No acute disease  Original Report Authenticated By: Osa Craver, M.D.    Negative enzymes, labs, CXR. VS stable. Based on pt's history, we recommended admission for serial cardiac enzymes. Pt refused. Advised that unsure if MI, needs more testing. PT signed out AMA.   1. Shortness of breath       MDM          Lottie Mussel, PA 05/25/12 [phone]

## 2012-05-24 NOTE — ED Notes (Signed)
Pt given putter butter crackers and orange juice for blood sugar.

## 2012-05-24 NOTE — ED Notes (Signed)
Pt reports not feeling well for about 1 week. Pt c/o having a headache x 1 week, shortness of breath for a couple of days. Pt breathing heavily in triage. Pt reports, "I'm scared." Pt asking multiple questions in triage. Skin moist to touch.

## 2012-05-24 NOTE — ED Notes (Signed)
Talked with pt about him being admitted. Pt stated that he did not want to stay and signed AMA papers. Stated that he would come back if he experienced more pain.

## 2012-05-25 ENCOUNTER — Emergency Department (HOSPITAL_COMMUNITY)
Admission: EM | Admit: 2012-05-25 | Discharge: 2012-05-25 | Disposition: A | Discharge status: Home / Self Care | Payer: Medicaid Other | Attending: Emergency Medicine | Admitting: Emergency Medicine

## 2012-05-25 ENCOUNTER — Emergency Department (HOSPITAL_COMMUNITY): Payer: Medicaid Other

## 2012-05-25 ENCOUNTER — Other Ambulatory Visit: Payer: Self-pay

## 2012-05-25 ENCOUNTER — Encounter (HOSPITAL_COMMUNITY): Payer: Self-pay | Admitting: *Deleted

## 2012-05-25 DIAGNOSIS — I10 Essential (primary) hypertension: Secondary | ICD-10-CM | POA: Insufficient documentation

## 2012-05-25 DIAGNOSIS — R05 Cough: Secondary | ICD-10-CM | POA: Insufficient documentation

## 2012-05-25 DIAGNOSIS — R059 Cough, unspecified: Secondary | ICD-10-CM | POA: Insufficient documentation

## 2012-05-25 DIAGNOSIS — E119 Type 2 diabetes mellitus without complications: Secondary | ICD-10-CM | POA: Insufficient documentation

## 2012-05-25 DIAGNOSIS — R0602 Shortness of breath: Secondary | ICD-10-CM | POA: Insufficient documentation

## 2012-05-25 DIAGNOSIS — R079 Chest pain, unspecified: Secondary | ICD-10-CM | POA: Insufficient documentation

## 2012-05-25 DIAGNOSIS — R51 Headache: Secondary | ICD-10-CM | POA: Insufficient documentation

## 2012-05-25 LAB — POCT I-STAT TROPONIN I: Troponin i, poc: 0 ng/mL (ref 0.00–0.08)

## 2012-05-25 LAB — BASIC METABOLIC PANEL
Calcium: 10.1 mg/dL (ref 8.4–10.5)
Creatinine, Ser: 0.66 mg/dL (ref 0.50–1.35)
GFR calc non Af Amer: >90 mL/min (ref >90)
Sodium: 135 mEq/L (ref 135–145)

## 2012-05-25 LAB — CBC
MCH: 31.2 pg (ref 26.0–34.0)
MCHC: 34.5 g/dL (ref 30.0–36.0)
Platelets: 146 10*3/uL — ABNORMAL LOW (ref 150–400)
RDW: 13.6 % (ref 11.5–15.5)

## 2012-05-25 MED ORDER — KETOROLAC TROMETHAMINE 60 MG/2ML IM SOLN
INTRAMUSCULAR | Status: AC
  Filled 2012-05-25: qty 2

## 2012-05-25 MED ORDER — KETOROLAC TROMETHAMINE 60 MG/2ML IM SOLN
60.0000 mg | Freq: Once | INTRAMUSCULAR | Status: AC
  Administered 2012-05-25: 60 mg via INTRAMUSCULAR

## 2012-05-25 MED ORDER — KETOROLAC TROMETHAMINE 10 MG PO TABS: 10.0000 mg | ORAL_TABLET | Freq: Four times a day (QID) | ORAL | Status: AC | PRN

## 2012-05-25 MED ORDER — TRAMADOL HCL 50 MG PO TABS: 50.0000 mg | ORAL_TABLET | Freq: Once | ORAL | Status: DC

## 2012-05-25 NOTE — ED Notes (Signed)
Patient states sob and headache x 1 week, patient states intermittant chest pain, patient with history of pituitary tumor and history of aortic/femoral bypass,

## 2012-05-25 NOTE — ED Provider Notes (Signed)
Medical screening examination/treatment/procedure(s) were performed by non-physician practitioner and as supervising physician I was immediately available for consultation/collaboration.  Ethelda Chick, MD 05/25/12 [phone]

## 2012-05-25 NOTE — Discharge Instructions (Signed)
Headaches, Frequently Asked Questions MIGRAINE HEADACHES Q: What is migraine? What causes it? How can I treat it? A: Generally, migraine headaches begin as a dull ache. Then they develop into a constant, throbbing, and pulsating pain. You may experience pain at the temples. You may experience pain at the front or back of one or both sides of the head. The pain is usually accompanied by a combination of:  Nausea.   Vomiting.   Sensitivity to light and noise.  Some people (about 15%) experience an aura (see below) before an attack. The cause of migraine is believed to be chemical reactions in the brain. Treatment for migraine may include over-the-counter or prescription medications. It may also include self-help techniques. These include relaxation training and biofeedback.  Q: What is an aura? A: About 15% of people with migraine get an "aura". This is a sign of neurological symptoms that occur before a migraine headache. You may see wavy or jagged lines, dots, or flashing lights. You might experience tunnel vision or blind spots in one or both eyes. The aura can include visual or auditory hallucinations (something imagined). It may include disruptions in smell (such as strange odors), taste or touch. Other symptoms include:  Numbness.   A "pins and needles" sensation.   Difficulty in recalling or speaking the correct word.  These neurological events may last as long as 60 minutes. These symptoms will fade as the headache begins. Q: What is a trigger? A: Certain physical or environmental factors can lead to or "trigger" a migraine. These include:  Foods.   Hormonal changes.   Weather.   Stress.  It is important to remember that triggers are different for everyone. To help prevent migraine attacks, you need to figure out which triggers affect you. Keep a headache diary. This is a good way to track triggers. The diary will help you talk to your healthcare professional about your  condition. Q: Does weather affect migraines? A: Bright sunshine, hot, humid conditions, and drastic changes in barometric pressure may lead to, or "trigger," a migraine attack in some people. But studies have shown that weather does not act as a trigger for everyone with migraines. Q: What is the link between migraine and hormones? A: Hormones start and regulate many of your body's functions. Hormones keep your body in balance within a constantly changing environment. The levels of hormones in your body are unbalanced at times. Examples are during menstruation, pregnancy, or menopause. That can lead to a migraine attack. In fact, about three quarters of all women with migraine report that their attacks are related to the menstrual cycle.  Q: Is there an increased risk of stroke for migraine sufferers? A: The likelihood of a migraine attack causing a stroke is very remote. That is not to say that migraine sufferers cannot have a stroke associated with their migraines. In persons under age 40, the most common associated factor for stroke is migraine headache. But over the course of a person's normal life span, the occurrence of migraine headache may actually be associated with a reduced risk of dying from cerebrovascular disease due to stroke.  Q: What are acute medications for migraine? A: Acute medications are used to treat the pain of the headache after it has started. Examples over-the-counter medications, NSAIDs, ergots, and triptans.  Q: What are the triptans? A: Triptans are the newest class of abortive medications. They are specifically targeted to treat migraine. Triptans are vasoconstrictors. They moderate some chemical reactions in the brain.   The triptans work on receptors in your brain. Triptans help to restore the balance of a neurotransmitter called serotonin. Fluctuations in levels of serotonin are thought to be a main cause of migraine.  Q: Are over-the-counter medications for migraine  effective? A: Over-the-counter, or "OTC," medications may be effective in relieving mild to moderate pain and associated symptoms of migraine. But you should see your caregiver before beginning any treatment regimen for migraine.  Q: What are preventive medications for migraine? A: Preventive medications for migraine are sometimes referred to as "prophylactic" treatments. They are used to reduce the frequency, severity, and length of migraine attacks. Examples of preventive medications include antiepileptic medications, antidepressants, beta-blockers, calcium channel blockers, and NSAIDs (nonsteroidal anti-inflammatory drugs). Q: Why are anticonvulsants used to treat migraine? A: During the past few years, there has been an increased interest in antiepileptic drugs for the prevention of migraine. They are sometimes referred to as "anticonvulsants". Both epilepsy and migraine may be caused by similar reactions in the brain.  Q: Why are antidepressants used to treat migraine? A: Antidepressants are typically used to treat people with depression. They may reduce migraine frequency by regulating chemical levels, such as serotonin, in the brain.  Q: What alternative therapies are used to treat migraine? A: The term "alternative therapies" is often used to describe treatments considered outside the scope of conventional Western medicine. Examples of alternative therapy include acupuncture, acupressure, and yoga. Another common alternative treatment is herbal therapy. Some herbs are believed to relieve headache pain. Always discuss alternative therapies with your caregiver before proceeding. Some herbal products contain arsenic and other toxins. TENSION HEADACHES Q: What is a tension-type headache? What causes it? How can I treat it? A: Tension-type headaches occur randomly. They are often the result of temporary stress, anxiety, fatigue, or anger. Symptoms include soreness in your temples, a tightening  band-like sensation around your head (a "vice-like" ache). Symptoms can also include a pulling feeling, pressure sensations, and contracting head and neck muscles. The headache begins in your forehead, temples, or the back of your head and neck. Treatment for tension-type headache may include over-the-counter or prescription medications. Treatment may also include self-help techniques such as relaxation training and biofeedback. CLUSTER HEADACHES Q: What is a cluster headache? What causes it? How can I treat it? A: Cluster headache gets its name because the attacks come in groups. The pain arrives with little, if any, warning. It is usually on one side of the head. A tearing or bloodshot eye and a runny nose on the same side of the headache may also accompany the pain. Cluster headaches are believed to be caused by chemical reactions in the brain. They have been described as the most severe and intense of any headache type. Treatment for cluster headache includes prescription medication and oxygen. SINUS HEADACHES Q: What is a sinus headache? What causes it? How can I treat it? A: When a cavity in the bones of the face and skull (a sinus) becomes inflamed, the inflammation will cause localized pain. This condition is usually the result of an allergic reaction, a tumor, or an infection. If your headache is caused by a sinus blockage, such as an infection, you will probably have a fever. An x-ray will confirm a sinus blockage. Your caregiver's treatment might include antibiotics for the infection, as well as antihistamines or decongestants.  REBOUND HEADACHES Q: What is a rebound headache? What causes it? How can I treat it? A: A pattern of taking acute headache medications too   often can lead to a condition known as "rebound headache." A pattern of taking too much headache medication includes taking it more than 2 days per week or in excessive amounts. That means more than the label or a caregiver advises.  With rebound headaches, your medications not only stop relieving pain, they actually begin to cause headaches. Doctors treat rebound headache by tapering the medication that is being overused. Sometimes your caregiver will gradually substitute a different type of treatment or medication. Stopping may be a challenge. Regularly overusing a medication increases the potential for serious side effects. Consult a caregiver if you regularly use headache medications more than 2 days per week or more than the label advises. ADDITIONAL QUESTIONS AND ANSWERS Q: What is biofeedback? A: Biofeedback is a self-help treatment. Biofeedback uses special equipment to monitor your body's involuntary physical responses. Biofeedback monitors:  Breathing.   Pulse.   Heart rate.   Temperature.   Muscle tension.   Brain activity.  Biofeedback helps you refine and perfect your relaxation exercises. You learn to control the physical responses that are related to stress. Once the technique has been mastered, you do not need the equipment any more. Q: Are headaches hereditary? A: Four out of five (80%) of people that suffer report a family history of migraine. Scientists are not sure if this is genetic or a family predisposition. Despite the uncertainty, a child has a 50% chance of having migraine if one parent suffers. The child has a 75% chance if both parents suffer.  Q: Can children get headaches? A: By the time they reach high school, most young people have experienced some type of headache. Many safe and effective approaches or medications can prevent a headache from occurring or stop it after it has begun.  Q: What type of doctor should I see to diagnose and treat my headache? A: Start with your primary caregiver. Discuss his or her experience and approach to headaches. Discuss methods of classification, diagnosis, and treatment. Your caregiver may decide to recommend you to a headache specialist, depending upon  your symptoms or other physical conditions. Having diabetes, allergies, etc., may require a more comprehensive and inclusive approach to your headache. The National [employer] Foundation will provide, upon request, a list of NHF physician members in your state. Document Released: 02/01/2004 Document Revised: 10/31/2011 Document Reviewed: 07/11/2008 Exit[employer] Patient Information 2012 [employer].General Headache, Without Cause A general headache has no specific cause. These headaches are not life-threatening. They will not lead to other types of headaches. HOME CARE   Make and keep follow-up visits with your doctor.   Only take medicine as told by your doctor.   Try to relax, get a massage, or use your thoughts to control your body (biofeedback).   Apply cold or heat to the head and neck. Apply 3 or 4 times a day or as needed.  Finding out the results of your test Ask when your test results will be ready. Make sure you get your test results. GET HELP RIGHT AWAY IF:   You have problems with medicine.   Your medicine does not help relieve pain.   Your headache changes or becomes worse.   You feel sick to your stomach (nauseous) or throw up (vomit).   You have a temperature by mouth above 102 F (38.9 C), not controlled by medicine.   Your have a stiff neck.   You have vision loss.   You have muscle weakness.   You lose control of your   muscles.   You lose balance or have trouble walking.   You feel like you are going to pass out (faint).  MAKE SURE YOU:   Understand these instructions.   Will watch this condition.   Will get help right away if you are not doing well or get worse.  Document Released: 08/20/2008 Document Revised: 10/31/2011 Document Reviewed: 08/20/2008 Exit[employer] Patient Information 2012 [employer]. 
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
## 2012-05-25 NOTE — ED Provider Notes (Signed)
  I performed a history and physical examination of [name] [name] and discussed his management with Dr.Ritter.  I agree with the history, physical, assessment, and plan of care, with the following exceptions: None  I was present for the following procedures: None Time Spent in Critical Care of the patient: None Time spent in discussions with the patient and family: 10  [name] Starry Corlis Leak, MD 05/25/12 2211

## 2012-05-25 NOTE — ED Provider Notes (Signed)
History     CSN: [id number]  Arrival date & time 05/25/12  2049   First MD Initiated Contact with Patient 05/25/12 2131      Chief Complaint  Patient presents with  . Shortness of Breath  . Headache  . Chest Pain    (Consider location/radiation/quality/duration/timing/severity/associated sxs/prior treatment) Patient is a 61 y.o. male presenting with headaches. The history is provided by the patient.  Headache  This is a new problem. The current episode started more than 1 week ago. The problem occurs every few minutes. The problem has not changed since onset.The headache is associated with nothing. The pain is located in the occipital region. The quality of the pain is described as dull. The pain is at a severity of 6/10. The pain is moderate. The pain does not radiate. Associated symptoms include shortness of breath. Pertinent negatives include no anorexia, no fever, no malaise/fatigue, no chest pressure, no near-syncope, no orthopnea, no palpitations, no syncope, no nausea and no vomiting. He has tried nothing for the symptoms. The treatment provided no relief.    Past Medical History  Diagnosis Date  . Diabetes mellitus   . Hyperlipidemia   . Hypertension   . Arthritis   . Leg pain   . Seizure disorder   . Myocardial infarction     "silent heart attack by patient"  . Peripheral vascular disease   . Bronchitis   . Pituitary disorder     growth on gland, evaluated every 6 months , at [facility]  . Seizures     last seizure "years ago"  . Coronary artery disease   . Chronic kidney disease     hx UTI, and kidney stones    Past Surgical History  Procedure Date  . Thrombectomy     left to right fem-fem bypass  . Hernia repair   . Aortogram with iliac artery stenting 05/06/2011  . Tonsillectomy   . Aorta - bilateral femoral artery bypass graft 11/25/2011    Procedure: AORTA BIFEMORAL BYPASS GRAFT;  Surgeon: Larina Earthly, MD;  Location: [facility] OR;  Service: Vascular;   Laterality: N/A;  . Pr vein bypass graft,aorto-fem-pop 11/24/12    Family History  Problem Relation Age of Onset  . Hypertension Mother   . Heart attack Mother   . Diabetes Mother   . Aneurysm Father   . Heart attack Sister   . Heart disease Brother     heart transplant    History  Substance Use Topics  . Smoking status: Current Everyday Smoker -- 1.0 packs/day for 44 years    Types: Cigarettes  . Smokeless tobacco: Not on file  . Alcohol Use: No     alcohol abuse in the past      Review of Systems  Constitutional: Negative for fever, malaise/fatigue, activity change, appetite change and fatigue.  HENT: Negative for congestion, sore throat, facial swelling, rhinorrhea, trouble swallowing, neck pain, neck stiffness, voice change and sinus pressure.   Eyes: Negative.   Respiratory: Positive for cough and shortness of breath. Negative for choking, chest tightness and wheezing.   Cardiovascular: Negative for chest pain, palpitations, orthopnea, syncope and near-syncope.  Gastrointestinal: Negative for nausea, vomiting, abdominal pain and anorexia.  Genitourinary: Negative for dysuria, urgency, frequency, hematuria, flank pain and difficulty urinating.  Musculoskeletal: Negative for back pain and gait problem.  Skin: Negative for rash and wound.  Neurological: Positive for headaches. Negative for facial asymmetry, weakness and numbness.  Psychiatric/Behavioral: Negative for behavioral problems, confusion  and agitation. The patient is not nervous/anxious and is not hyperactive.   All other systems reviewed and are negative.    Allergies  Penicillins and Zetia  Home Medications   Current Outpatient Rx  Name Route Sig Dispense Refill  . ASPIRIN EC 81 MG PO TBEC Oral Take 81 mg by mouth daily.    Marland Kitchen DOCUSATE SODIUM 100 MG PO CAPS Oral Take 100 mg by mouth 2 (two) times daily.    Marland Kitchen GABAPENTIN 300 MG PO CAPS Oral Take 300 mg by mouth 2 (two) times daily as needed. FOR PAIN      . GLIPIZIDE 10 MG PO TABS Oral Take 10 mg by mouth 2 (two) times daily before a meal.      . METFORMIN HCL 500 MG PO TABS Oral Take 500 mg by mouth 2 (two) times daily with a meal.      . ADULT MULTIVITAMIN W/MINERALS CH Oral Take 1 tablet by mouth daily.      Marland Kitchen PHENYTOIN SODIUM EXTENDED 100 MG PO CAPS Oral Take 200-300 mg by mouth 2 (two) times daily. Take 200 mg in the morning & 300 mg in the evening.    Marland Kitchen ZOLPIDEM TARTRATE 5 MG PO TABS Oral Take 5 mg by mouth at bedtime as needed. For sleep    . KETOROLAC TROMETHAMINE 10 MG PO TABS Oral Take 1 tablet (10 mg total) by mouth every 6 (six) hours as needed for pain. 20 tablet 0    BP 135/85  Pulse 58  Temp 98.1 F (36.7 C) (Oral)  Resp 12  SpO2 98%  Physical Exam  Nursing note and vitals reviewed. Constitutional: He is oriented to person, place, and time. He appears well-developed and well-nourished. No distress.  HENT:  Head: Normocephalic and atraumatic.  Right Ear: External ear normal.  Left Ear: External ear normal.  Mouth/Throat: No oropharyngeal exudate.  Eyes: Conjunctivae and EOM are normal. Pupils are equal, round, and reactive to light. Right eye exhibits no discharge. Left eye exhibits no discharge.  Neck: Normal range of motion. Neck supple. No JVD present. No tracheal deviation present. No thyromegaly present.  Cardiovascular: Normal rate, regular rhythm, normal heart sounds and intact distal pulses.  Exam reveals no gallop and no friction rub.   No murmur heard. Pulmonary/Chest: Effort normal and breath sounds normal. No respiratory distress. He has no wheezes. He exhibits no tenderness.  Abdominal: Soft. Bowel sounds are normal. He exhibits no distension. There is no tenderness. There is no rebound and no guarding.  Musculoskeletal: Normal range of motion. He exhibits no edema and no tenderness.  Lymphadenopathy:    He has no cervical adenopathy.  Neurological: He is alert and oriented to person, place, and time. No  cranial nerve deficit. Coordination normal.  Skin: Skin is warm and dry. No rash noted. He is not diaphoretic. No pallor.  Psychiatric: He has a normal mood and affect. His behavior is normal.    ED Course  Procedures (including critical care time)  Labs Reviewed  CBC - Abnormal; Notable for the following:    Platelets 146 (*)     All other components within normal limits  BASIC METABOLIC PANEL  POCT I-STAT TROPONIN I  GLUCOSE, CAPILLARY   Dg Chest 2 View  05/24/2012  *RADIOLOGY REPORT*  Clinical Data: Headache, shortness of breath  CHEST - 2 VIEW  Comparison: 12/06/2011  Findings: Mild hyperinflation. Lungs clear.  Heart size and pulmonary vascularity normal.  No effusion.  Visualized bones unremarkable.  IMPRESSION: No acute disease  Original Report Authenticated By: Osa Craver, M.D.   Ct Head Wo Contrast  05/25/2012  *RADIOLOGY REPORT*  Clinical Data: Shortness of breath, headache, chest pain.  CT HEAD WITHOUT CONTRAST  Technique:  Contiguous axial images were obtained from the base of the skull through the vertex without contrast.  Comparison: 06/28/2012  Findings: No acute intracranial abnormality.  Specifically, no hemorrhage, hydrocephalus, mass lesion, acute infarction, or significant intracranial injury.  No acute calvarial abnormality.  Adenoma within the pituitary gland again noted as seen on prior MRI.  Visualized paranasal sinuses and mastoids clear.  Orbital soft tissues unremarkable.  IMPRESSION: No acute intracranial abnormality.  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  Original Report Authenticated By: Cyndie Chime, M.D.     1. Headache       MDM  61 year old male patient with past medical history of diabetes hyperlipidemia hypertension and seizure disorder presents with headache. Patient was seen here yesterday with the same complaint. Patient was also complaining of some chest tightness and shortness of breath yesterday. Patient had negative troponin  negative chest x-ray negative labs yesterday. Patient was advised that it might be prudent to be admitted to the hospital for further workup. Patient left AMA. Patient presents today with similar complaints. Patient denies chest pain on my interview. Patient says he has some abdominal pain that's associated with his femoral bypass surgery. Patient says this pain is the same pain that he's been having for the past 6 months since she's had the surgery. Again patient denies chest pain on repeat questioning. The patient does not have any meningeal signs and has no fever. The patient with normal vital signs afebrile normal heart sounds lung sounds normal abdominal exam. Patient without any cranial nerve deficits and normal neurological exam. Given the history of pituitary adenoma and new-onset headache will get a head CT. Head CT shows no acute intracranial abnormality and has a similarly sized pituitary gland. Patient given tramadol which resolved his headache. Doubt pneumonia or pneumothorax pericarditis given exam and normal chest x-ray from yesterday. Doubt ACS this patient had normal troponin yesterday and normal troponin today. Patient without chest pain. Does not appear to be any intracranial abnormality and no signs of infection. Headache now resolved. Patient sent home with instructions to follow up with PCP.  Results for orders placed during the hospital encounter of 05/25/12  CBC      Component Value Range   WBC 6.4  4.0 - 10.5 K/uL   RBC 4.61  4.22 - 5.81 MIL/uL   Hemoglobin 14.4  13.0 - 17.0 g/dL   HCT 46.9  62.9 - 52.8 %   MCV 90.5  78.0 - 100.0 fL   MCH 31.2  26.0 - 34.0 pg   MCHC 34.5  30.0 - 36.0 g/dL   RDW 41.3  24.4 - 01.0 %   Platelets 146 (*) 150 - 400 K/uL  BASIC METABOLIC PANEL      Component Value Range   Sodium 135  135 - 145 mEq/L   Potassium 3.8  3.5 - 5.1 mEq/L   Chloride 98  96 - 112 mEq/L   CO2 26  19 - 32 mEq/L   Glucose, Bld 78  70 - 99 mg/dL   BUN 12  6 - 23 mg/dL    Creatinine, Ser 2.72  0.50 - 1.35 mg/dL   Calcium 53.6  8.4 - 64.4 mg/dL   GFR calc non Af Amer >90  >90  mL/min   GFR calc Af Amer >90  >90 mL/min  POCT I-STAT TROPONIN I      Component Value Range   Troponin i, poc 0.00  0.00 - 0.08 ng/mL   Comment 3           GLUCOSE, CAPILLARY      Component Value Range   Glucose-Capillary 79  70 - 99 mg/dL   Comment 1 Documented in Chart     Comment 2 Notify RN     CT Head Wo Contrast (Final result)   Result time:05/25/12 2254    Final result by Rad Results In Interface (05/25/12 22:54:20)    Narrative:   *RADIOLOGY REPORT*  Clinical Data: Shortness of breath, headache, chest pain.  CT HEAD WITHOUT CONTRAST  Technique: Contiguous axial images were obtained from the base of the skull through the vertex without contrast.  Comparison: 06/28/2012  Findings: No acute intracranial abnormality. Specifically, no hemorrhage, hydrocephalus, mass lesion, acute infarction, or significant intracranial injury. No acute calvarial abnormality.  Adenoma within the pituitary gland again noted as seen on prior MRI.  Visualized paranasal sinuses and mastoids clear. Orbital soft tissues unremarkable.  IMPRESSION: No acute intracranial abnormality.  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  Original Report Authenticated By: Cyndie Chime, M.D.     Case discussed with Dr. Rosalia Hammers.        Sherryl Manges, MD 05/26/12 Marlyne Beards

## 2012-05-28 NOTE — ED Provider Notes (Signed)
See my note  Hilario Quarry, MD 05/28/12 [phone]

## 2012-05-29 ENCOUNTER — Emergency Department (HOSPITAL_COMMUNITY): Payer: Medicaid Other

## 2012-05-29 ENCOUNTER — Emergency Department (HOSPITAL_COMMUNITY)
Admission: EM | Admit: 2012-05-29 | Discharge: 2012-05-29 | Disposition: A | Discharge status: Home / Self Care | Payer: Medicaid Other | Attending: Emergency Medicine | Admitting: Emergency Medicine

## 2012-05-29 ENCOUNTER — Encounter (HOSPITAL_COMMUNITY): Payer: Self-pay | Admitting: Emergency Medicine

## 2012-05-29 DIAGNOSIS — R5381 Other malaise: Secondary | ICD-10-CM | POA: Insufficient documentation

## 2012-05-29 DIAGNOSIS — I129 Hypertensive chronic kidney disease with stage 1 through stage 4 chronic kidney disease, or unspecified chronic kidney disease: Secondary | ICD-10-CM | POA: Insufficient documentation

## 2012-05-29 DIAGNOSIS — Z7982 Long term (current) use of aspirin: Secondary | ICD-10-CM | POA: Insufficient documentation

## 2012-05-29 DIAGNOSIS — E785 Hyperlipidemia, unspecified: Secondary | ICD-10-CM | POA: Insufficient documentation

## 2012-05-29 DIAGNOSIS — R5383 Other fatigue: Secondary | ICD-10-CM

## 2012-05-29 DIAGNOSIS — R0602 Shortness of breath: Secondary | ICD-10-CM | POA: Insufficient documentation

## 2012-05-29 DIAGNOSIS — E119 Type 2 diabetes mellitus without complications: Secondary | ICD-10-CM | POA: Insufficient documentation

## 2012-05-29 DIAGNOSIS — Z79899 Other long term (current) drug therapy: Secondary | ICD-10-CM | POA: Insufficient documentation

## 2012-05-29 DIAGNOSIS — F17200 Nicotine dependence, unspecified, uncomplicated: Secondary | ICD-10-CM | POA: Insufficient documentation

## 2012-05-29 DIAGNOSIS — R110 Nausea: Secondary | ICD-10-CM | POA: Insufficient documentation

## 2012-05-29 DIAGNOSIS — Z8739 Personal history of other diseases of the musculoskeletal system and connective tissue: Secondary | ICD-10-CM | POA: Insufficient documentation

## 2012-05-29 DIAGNOSIS — N189 Chronic kidney disease, unspecified: Secondary | ICD-10-CM | POA: Insufficient documentation

## 2012-05-29 LAB — CBC
Hemoglobin: 14.7 g/dL (ref 13.0–17.0)
MCHC: 33.6 g/dL (ref 30.0–36.0)
RBC: 4.76 MIL/uL (ref 4.22–5.81)
WBC: 6.3 10*3/uL (ref 4.0–10.5)

## 2012-05-29 LAB — BASIC METABOLIC PANEL
CO2: 27 mEq/L (ref 19–32)
Chloride: 98 mEq/L (ref 96–112)
GFR calc non Af Amer: >90 mL/min (ref >90)
Glucose, Bld: 120 mg/dL — ABNORMAL HIGH (ref 70–99)
Potassium: 4.2 mEq/L (ref 3.5–5.1)
Sodium: 136 mEq/L (ref 135–145)

## 2012-05-29 LAB — GLUCOSE, CAPILLARY

## 2012-05-29 LAB — URINALYSIS, ROUTINE W REFLEX MICROSCOPIC
Glucose, UA: NEGATIVE mg/dL
Hgb urine dipstick: NEGATIVE
Ketones, ur: NEGATIVE mg/dL
Leukocytes, UA: NEGATIVE
Protein, ur: NEGATIVE mg/dL

## 2012-05-29 MED ORDER — AZITHROMYCIN 1 G PO PACK
1.0000 g | PACK | Freq: Once | ORAL | Status: AC
  Administered 2012-05-29: 1 g via ORAL
  Filled 2012-05-29: qty 1

## 2012-05-29 MED ORDER — CEFTRIAXONE SODIUM 250 MG IJ SOLR
250.0000 mg | Freq: Once | INTRAMUSCULAR | Status: AC
  Administered 2012-05-29: 250 mg via INTRAMUSCULAR
  Filled 2012-05-29: qty 250

## 2012-05-29 MED ORDER — LIDOCAINE HCL (PF) 1 % IJ SOLN
INTRAMUSCULAR | Status: AC
  Filled 2012-05-29: qty 5

## 2012-05-29 NOTE — ED Provider Notes (Signed)
History     CSN: [id number]  Arrival date & time 05/29/12  1627   First MD Initiated Contact with Patient 05/29/12 2032      Chief Complaint  Patient presents with  . Shortness of Breath  . Nausea    (Consider location/radiation/quality/duration/timing/severity/associated sxs/prior treatment) Patient is a 61 y.o. male presenting with general illness. The history is provided by the patient.  Illness  The current episode started more than 1 week ago. The onset was gradual. The problem occurs frequently. The problem has been unchanged. The problem is mild. Nothing relieves the symptoms. Nothing aggravates the symptoms. Pertinent negatives include no fever, no abdominal pain, no nausea, no vomiting, no congestion, no headaches, no rhinorrhea, no sore throat, no neck pain, no cough, no wheezing and no rash. He has been behaving normally. He has been eating and drinking normally. The last void occurred less than 6 hours ago. There were no sick contacts. Recently, medical care has been given at this facility. Services received include medications given and tests performed.    Past Medical History  Diagnosis Date  . Diabetes mellitus   . Hyperlipidemia   . Hypertension   . Arthritis   . Leg pain   . Seizure disorder   . Myocardial infarction     "silent heart attack by patient"  . Peripheral vascular disease   . Bronchitis   . Pituitary disorder     growth on gland, evaluated every 6 months , at [facility]  . Seizures     last seizure "years ago"  . Coronary artery disease   . Chronic kidney disease     hx UTI, and kidney stones    Past Surgical History  Procedure Date  . Thrombectomy     left to right fem-fem bypass  . Hernia repair   . Aortogram with iliac artery stenting 05/06/2011  . Tonsillectomy   . Aorta - bilateral femoral artery bypass graft 11/25/2011    Procedure: AORTA BIFEMORAL BYPASS GRAFT;  Surgeon: Larina Earthly, MD;  Location: [facility] OR;  Service: Vascular;   Laterality: N/A;  . Pr vein bypass graft,aorto-fem-pop 11/24/12    Family History  Problem Relation Age of Onset  . Hypertension Mother   . Heart attack Mother   . Diabetes Mother   . Aneurysm Father   . Heart attack Sister   . Heart disease Brother     heart transplant    History  Substance Use Topics  . Smoking status: Current Everyday Smoker -- 1.0 packs/day for 44 years    Types: Cigarettes  . Smokeless tobacco: Not on file  . Alcohol Use: No     alcohol abuse in the past      Review of Systems  Constitutional: Positive for fatigue. Negative for fever, activity change and appetite change.  HENT: Negative for congestion, sore throat, facial swelling, rhinorrhea, trouble swallowing, neck pain, neck stiffness, voice change and sinus pressure.   Eyes: Negative.   Respiratory: Positive for shortness of breath. Negative for cough, choking, chest tightness and wheezing.   Cardiovascular: Negative for chest pain.  Gastrointestinal: Negative for nausea, vomiting and abdominal pain.  Genitourinary: Negative for dysuria, urgency, frequency, hematuria, flank pain and difficulty urinating.  Musculoskeletal: Negative for back pain and gait problem.  Skin: Negative for rash and wound.  Neurological: Negative for facial asymmetry, weakness, numbness and headaches.  Psychiatric/Behavioral: Negative for behavioral problems, confusion and agitation. The patient is not nervous/anxious and is not hyperactive.  All other systems reviewed and are negative.    Allergies  Penicillins and Zetia  Home Medications   Current Outpatient Rx  Name Route Sig Dispense Refill  . ASPIRIN EC 81 MG PO TBEC Oral Take 81 mg by mouth daily.    Marland Kitchen DOCUSATE SODIUM 100 MG PO CAPS Oral Take 100 mg by mouth 2 (two) times daily.    Marland Kitchen GABAPENTIN 300 MG PO CAPS Oral Take 300 mg by mouth 2 (two) times daily as needed. FOR PAIN    . GLIPIZIDE 10 MG PO TABS Oral Take 10 mg by mouth 2 (two) times daily before a  meal.      . KETOROLAC TROMETHAMINE 10 MG PO TABS Oral Take 1 tablet (10 mg total) by mouth every 6 (six) hours as needed for pain. 20 tablet 0  . METFORMIN HCL 500 MG PO TABS Oral Take 500 mg by mouth 2 (two) times daily with a meal.      . ADULT MULTIVITAMIN W/MINERALS CH Oral Take 1 tablet by mouth daily.      Marland Kitchen PHENYTOIN SODIUM EXTENDED 100 MG PO CAPS Oral Take 200-300 mg by mouth 2 (two) times daily. Take 200 mg in the morning & 300 mg in the evening.    Marland Kitchen ZOLPIDEM TARTRATE 5 MG PO TABS Oral Take 5 mg by mouth at bedtime as needed. For sleep      BP 139/90  Pulse 71  Temp 98.1 F (36.7 C) (Oral)  Resp 18  SpO2 99%  Physical Exam  Nursing note and vitals reviewed. Constitutional: He is oriented to person, place, and time. He appears well-developed and well-nourished. No distress.  HENT:  Head: Normocephalic and atraumatic.  Right Ear: External ear normal.  Left Ear: External ear normal.  Mouth/Throat: No oropharyngeal exudate.  Eyes: Conjunctivae and EOM are normal. Pupils are equal, round, and reactive to light. Right eye exhibits no discharge. Left eye exhibits no discharge.  Neck: Normal range of motion. Neck supple. No JVD present. No tracheal deviation present. No thyromegaly present.  Cardiovascular: Normal rate, regular rhythm, normal heart sounds and intact distal pulses.  Exam reveals no gallop and no friction rub.   No murmur heard. Pulmonary/Chest: Effort normal and breath sounds normal. No respiratory distress. He has no wheezes. He exhibits no tenderness.  Abdominal: Soft. Bowel sounds are normal. He exhibits no distension. There is no tenderness. There is no rebound and no guarding.  Genitourinary: Penis normal. No penile tenderness.  Musculoskeletal: Normal range of motion. He exhibits no edema and no tenderness.  Lymphadenopathy:    He has no cervical adenopathy.  Neurological: He is alert and oriented to person, place, and time. No cranial nerve deficit.  Skin:  Skin is warm and dry. No rash noted. He is not diaphoretic. No pallor.  Psychiatric: He has a normal mood and affect. His behavior is normal.    ED Course  Procedures (including critical care time)  Labs Reviewed  BASIC METABOLIC PANEL - Abnormal; Notable for the following:    Glucose, Bld 120 (*)     All other components within normal limits  GLUCOSE, CAPILLARY - Abnormal; Notable for the following:    Glucose-Capillary 126 (*)     All other components within normal limits  CBC  POCT I-STAT TROPONIN I  URINALYSIS, ROUTINE W REFLEX MICROSCOPIC  RAPID HIV SCREEN ([facility])  GC/CHLAMYDIA PROBE AMP, URINE   Dg Chest 2 View  05/29/2012  *RADIOLOGY REPORT*  Clinical Data: 1-week history  of wheezing and shortness of breath. Chest pain which began today.  CHEST - 2 VIEW  Comparison: Two-view chest x-ray 05/24/2012, 11/11/2011.  CTA chest 12/06/2011.  Findings: Cardiac silhouette normal in size, unchanged.  Thoracic aorta mildly tortuous, unchanged.  Hilar and mediastinal contours otherwise unremarkable.  Lungs clear.  Bronchovascular markings normal.  Pulmonary vascularity normal.  No pneumothorax.  No pleural effusions.  Pectus excavatum sternal deformity, unchanged. Mild degenerative changes involving the thoracic spine.  IMPRESSION: No acute cardiopulmonary disease.  Stable examination.  Original Report Authenticated By: Arnell Sieving, M.D.     1. Malaise   2. Fatigue       MDM  61 year old male patient with past medical history diabetes hypertension hyperlipidemia presents with general malaise and feelings of shortness of breath. Patient was here 2 days ago for the same complaints. Patient had head CT at that time because he had headache that was negative. Patient also had normal EKG chest x-ray labs and troponins at that time and was discharged feeling better. Patient is returning today complaining of the same but without headaches today. Patient endorses concern about possible STD  exposure. Exam is normal with no rashes normal heart sounds lung sounds abdominal exam. Genital exam shows no lesions tenderness or discharge. Patient doesn't course seeing some discharge his urine sometimes but does not endorse dysuria. Patient does say that he has had unprotected sexual relationships with concerning individuals. He wants to be evaluated for STDs. Here repeat troponins chest x-ray EKG and labs were all normal. Given patient's concern for exposure he was treated empirically with azithromycin and Rocephin and labs were sent off for evaluation for GC chlamydia and HIV.  Results for orders placed during the hospital encounter of 05/29/12  CBC      Component Value Range   WBC 6.3  4.0 - 10.5 K/uL   RBC 4.76  4.22 - 5.81 MIL/uL   Hemoglobin 14.7  13.0 - 17.0 g/dL   HCT 91.4  78.2 - 95.6 %   MCV 92.0  78.0 - 100.0 fL   MCH 30.9  26.0 - 34.0 pg   MCHC 33.6  30.0 - 36.0 g/dL   RDW 21.3  08.6 - 57.8 %   Platelets 162  150 - 400 K/uL  BASIC METABOLIC PANEL      Component Value Range   Sodium 136  135 - 145 mEq/L   Potassium 4.2  3.5 - 5.1 mEq/L   Chloride 98  96 - 112 mEq/L   CO2 27  19 - 32 mEq/L   Glucose, Bld 120 (*) 70 - 99 mg/dL   BUN 14  6 - 23 mg/dL   Creatinine, Ser 4.69  0.50 - 1.35 mg/dL   Calcium 62.9  8.4 - 52.8 mg/dL   GFR calc non Af Amer >90  >90 mL/min   GFR calc Af Amer >90  >90 mL/min  GLUCOSE, CAPILLARY      Component Value Range   Glucose-Capillary 126 (*) 70 - 99 mg/dL   Comment 1 Documented in Chart     Comment 2 Notify RN    POCT I-STAT TROPONIN I      Component Value Range   Troponin i, poc 0.01  0.00 - 0.08 ng/mL   Comment 3           URINALYSIS, ROUTINE W REFLEX MICROSCOPIC      Component Value Range   Color, Urine YELLOW  YELLOW   APPearance CLEAR  CLEAR  Specific Gravity, Urine 1.023  1.005 - 1.030   pH 5.5  5.0 - 8.0   Glucose, UA NEGATIVE  NEGATIVE mg/dL   Hgb urine dipstick NEGATIVE  NEGATIVE   Bilirubin Urine NEGATIVE  NEGATIVE    Ketones, ur NEGATIVE  NEGATIVE mg/dL   Protein, ur NEGATIVE  NEGATIVE mg/dL   Urobilinogen, UA 0.2  0.0 - 1.0 mg/dL   Nitrite NEGATIVE  NEGATIVE   Leukocytes, UA NEGATIVE  NEGATIVE  RAPID HIV SCREEN ([facility])      Component Value Range   SUDS Rapid HIV Screen NON REACTIVE  NON REACTIVE   Date: 05/29/2012  Rate: 80  Rhythm: normal sinus rhythm  QRS Axis: normal  Intervals: normal  ST/T Wave abnormalities: normal  Conduction Disutrbances:none  Narrative Interpretation:   Old EKG Reviewed: unchanged  DG Chest 2 View (Final result)   Result time:05/29/12 1718    Final result by Rad Results In Interface (05/29/12 17:18:02)    Narrative:   *RADIOLOGY REPORT*  Clinical Data: 1-week history of wheezing and shortness of breath. Chest pain which began today.  CHEST - 2 VIEW  Comparison: Two-view chest x-ray 05/24/2012, 11/11/2011. CTA chest 12/06/2011.  Findings: Cardiac silhouette normal in size, unchanged. Thoracic aorta mildly tortuous, unchanged. Hilar and mediastinal contours otherwise unremarkable. Lungs clear. Bronchovascular markings normal. Pulmonary vascularity normal. No pneumothorax. No pleural effusions. Pectus excavatum sternal deformity, unchanged. Mild degenerative changes involving the thoracic spine.  IMPRESSION: No acute cardiopulmonary disease. Stable examination.  Original Report Authenticated By: Arnell Sieving, M.D.    Case discussed with Dr. Burnett Corrente, MD 05/29/12 [phone]

## 2012-05-29 NOTE — ED Notes (Signed)
The pt says he feels very bad for 12-14 days and he has been here  X 3 for the same.  He is a diabetic and his sugar has been up and down.  He takes oral sugar meds and is followed at  [facility]

## 2012-05-29 NOTE — ED Notes (Signed)
Pt now asking if anxiety or STD can cause his symptoms.  Wants to be checked for STD.

## 2012-05-29 NOTE — ED Notes (Signed)
Informed MD Bednar that pt's Rapid HIV test was non reactive per lab

## 2012-05-29 NOTE — ED Notes (Signed)
C/o generalized fatigue, sob, nausea, and stiff neck x 10 days.  C/o intermittent dull pain to L lower chest for "years."

## 2012-05-29 NOTE — ED Provider Notes (Signed)
I saw and evaluated the patient, reviewed the resident's note and I agree with the findings and plan.  Pt nontoxic, normal speech and gait.  Hurman Horn, MD 05/30/12 [phone]

## 2012-05-30 NOTE — ED Provider Notes (Signed)
I saw and evaluated the patient, reviewed the resident's note and I agree with the findings including ECG and plan.  Hurman Horn, MD 05/30/12 1255

## 2012-11-24 HISTORY — PX: PR VEIN BYPASS GRAFT,AORTO-FEM-POP: 35551

## 2013-06-03 ENCOUNTER — Encounter (HOSPITAL_COMMUNITY): Payer: Self-pay

## 2013-06-03 ENCOUNTER — Emergency Department (HOSPITAL_COMMUNITY)
Admission: EM | Admit: 2013-06-03 | Discharge: 2013-06-03 | Disposition: A | Discharge status: Home / Self Care | Payer: Medicaid Other | Attending: Emergency Medicine | Admitting: Emergency Medicine

## 2013-06-03 ENCOUNTER — Telehealth (HOSPITAL_COMMUNITY): Payer: Self-pay | Admitting: Emergency Medicine

## 2013-06-03 DIAGNOSIS — M129 Arthropathy, unspecified: Secondary | ICD-10-CM | POA: Insufficient documentation

## 2013-06-03 DIAGNOSIS — D696 Thrombocytopenia, unspecified: Secondary | ICD-10-CM | POA: Insufficient documentation

## 2013-06-03 DIAGNOSIS — N189 Chronic kidney disease, unspecified: Secondary | ICD-10-CM | POA: Insufficient documentation

## 2013-06-03 DIAGNOSIS — Z862 Personal history of diseases of the blood and blood-forming organs and certain disorders involving the immune mechanism: Secondary | ICD-10-CM | POA: Insufficient documentation

## 2013-06-03 DIAGNOSIS — I129 Hypertensive chronic kidney disease with stage 1 through stage 4 chronic kidney disease, or unspecified chronic kidney disease: Secondary | ICD-10-CM | POA: Insufficient documentation

## 2013-06-03 DIAGNOSIS — I252 Old myocardial infarction: Secondary | ICD-10-CM | POA: Insufficient documentation

## 2013-06-03 DIAGNOSIS — Z7982 Long term (current) use of aspirin: Secondary | ICD-10-CM | POA: Insufficient documentation

## 2013-06-03 DIAGNOSIS — Z8709 Personal history of other diseases of the respiratory system: Secondary | ICD-10-CM | POA: Insufficient documentation

## 2013-06-03 DIAGNOSIS — G40909 Epilepsy, unspecified, not intractable, without status epilepticus: Secondary | ICD-10-CM | POA: Insufficient documentation

## 2013-06-03 DIAGNOSIS — Z880 Allergy status to penicillin: Secondary | ICD-10-CM | POA: Insufficient documentation

## 2013-06-03 DIAGNOSIS — E119 Type 2 diabetes mellitus without complications: Secondary | ICD-10-CM | POA: Insufficient documentation

## 2013-06-03 DIAGNOSIS — M542 Cervicalgia: Secondary | ICD-10-CM | POA: Insufficient documentation

## 2013-06-03 DIAGNOSIS — Z87440 Personal history of urinary (tract) infections: Secondary | ICD-10-CM | POA: Insufficient documentation

## 2013-06-03 DIAGNOSIS — Z87442 Personal history of urinary calculi: Secondary | ICD-10-CM | POA: Insufficient documentation

## 2013-06-03 DIAGNOSIS — I2510 Atherosclerotic heart disease of native coronary artery without angina pectoris: Secondary | ICD-10-CM | POA: Insufficient documentation

## 2013-06-03 DIAGNOSIS — Z8679 Personal history of other diseases of the circulatory system: Secondary | ICD-10-CM | POA: Insufficient documentation

## 2013-06-03 DIAGNOSIS — R079 Chest pain, unspecified: Secondary | ICD-10-CM | POA: Insufficient documentation

## 2013-06-03 DIAGNOSIS — G8929 Other chronic pain: Secondary | ICD-10-CM | POA: Insufficient documentation

## 2013-06-03 DIAGNOSIS — F17200 Nicotine dependence, unspecified, uncomplicated: Secondary | ICD-10-CM | POA: Insufficient documentation

## 2013-06-03 DIAGNOSIS — Z8639 Personal history of other endocrine, nutritional and metabolic disease: Secondary | ICD-10-CM | POA: Insufficient documentation

## 2013-06-03 LAB — CBC WITH DIFFERENTIAL/PLATELET
Basophils Absolute: 0.1 10*3/uL (ref 0.0–0.1)
Basophils Relative: 1 % (ref 0–1)
Eosinophils Absolute: 0.2 10*3/uL (ref 0.0–0.7)
Eosinophils Relative: 3 % (ref 0–5)
HCT: 44.3 % (ref 39.0–52.0)
Hemoglobin: 15.7 g/dL (ref 13.0–17.0)
Lymphocytes Relative: 36 % (ref 12–46)
Lymphs Abs: 2.7 10*3/uL (ref 0.7–4.0)
MCH: 32 pg (ref 26.0–34.0)
MCHC: 35.4 g/dL (ref 30.0–36.0)
MCV: 90.4 fL (ref 78.0–100.0)
Monocytes Absolute: 0.6 10*3/uL (ref 0.1–1.0)
Monocytes Relative: 8 % (ref 3–12)
Neutro Abs: 3.8 10*3/uL (ref 1.7–7.7)
Neutrophils Relative %: 52 % (ref 43–77)
Platelets: 82 10*3/uL — ABNORMAL LOW (ref 150–400)
RBC: 4.9 MIL/uL (ref 4.22–5.81)
RDW: 13.5 % (ref 11.5–15.5)
WBC: 7.3 10*3/uL (ref 4.0–10.5)

## 2013-06-03 LAB — BASIC METABOLIC PANEL
CO2: 19 mEq/L (ref 19–32)
Calcium: 9.6 mg/dL (ref 8.4–10.5)
Creatinine, Ser: 0.53 mg/dL (ref 0.50–1.35)
Glucose, Bld: 112 mg/dL — ABNORMAL HIGH (ref 70–99)

## 2013-06-03 LAB — GLUCOSE, CAPILLARY: Glucose-Capillary: 77 mg/dL (ref 70–99)

## 2013-06-03 MED ORDER — ONDANSETRON HCL 4 MG/2ML IJ SOLN
4.0000 mg | Freq: Once | INTRAMUSCULAR | Status: AC
  Administered 2013-06-03: 4 mg via INTRAVENOUS
  Filled 2013-06-03: qty 2

## 2013-06-03 MED ORDER — MORPHINE SULFATE 4 MG/ML IJ SOLN
4.0000 mg | Freq: Once | INTRAMUSCULAR | Status: AC
Start: 2013-06-03 — End: 2013-06-03
  Administered 2013-06-03: 4 mg via INTRAVENOUS
  Filled 2013-06-03: qty 1

## 2013-06-03 MED ORDER — OXYCODONE-ACETAMINOPHEN 5-325 MG PO TABS: 2.0000 | ORAL_TABLET | Freq: Four times a day (QID) | ORAL | Status: DC | PRN

## 2013-06-03 MED ORDER — SODIUM CHLORIDE 0.9 % IV BOLUS (SEPSIS)
1000.0000 mL | Freq: Once | INTRAVENOUS | Status: AC
  Administered 2013-06-03: 1000 mL via INTRAVENOUS

## 2013-06-03 NOTE — ED Notes (Signed)
Pt requesting more pain meds.  Pt st's he is driving per Dr. Fonnie Jarvis pt can not have any narcotics unless he has a ride.  Pt requesting Morphine.

## 2013-06-03 NOTE — ED Provider Notes (Signed)
MSE performed by me and Scribe by Sonum.  In brief pt has significant hx of cardiac disease and vascular disease presents with neck pain which radiates to L upper chest and L arm not relief from taking high dose of ASA and muscle relaxant.  Pt will need further work up to r/o cardiac etiology.    History    This chart was scribed for non-physician practitioner working with Juliet Rude. Rubin Payor, MD by Leone Payor, ED Scribe. This patient was seen in room TR09C/TR09C and the patient's care was started at 1407.  CSN: [id number] Arrival date & time 06/03/13  1407  First MD Initiated Contact with Patient 06/03/13 1507     No chief complaint on file.  The history is provided by the patient. No language interpreter was used.    HPI Comments: [name] [name] is a 62 y.o. male with past medical history of cardiac and vascular disease who presents to the Emergency Department complaining of constant, unchanged neck pain that radiates to L arm and L chest starting 1-2 days ago. Pt reports he was sleeping in a chair with his neck hanging down for several hours at a time for 2 nights in a row. States he didn't notice the pain after the first night but then he noticed after the second night from sleeping in the same position. Describes the pain as stabbing and sharp. Pain radiates down to L upper chest and L arm.   He has taken large amounts of aspirin and a muscle relaxer with some improvement but states the pain never completely goes away. Movement aggravates the pain. He denies lightheadedness, dizziness, sob, n/v/d, abd pain, numbness or weakness.     Past Medical History  Diagnosis Date  . Diabetes mellitus   . Hyperlipidemia   . Hypertension   . Arthritis   . Leg pain   . Seizure disorder   . Myocardial infarction     "silent heart attack by patient"  . Peripheral vascular disease   . Bronchitis   . 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  . Seizures      last seizure "years ago"  . Coronary artery disease   . Chronic kidney disease     hx UTI, and kidney stones   Past Surgical History  Procedure Laterality Date  . Thrombectomy      left to right fem-fem bypass  . Hernia repair    . Aortogram with iliac artery stenting  05/06/2011  . Tonsillectomy    . Aorta - bilateral femoral artery bypass graft  11/25/2011    Procedure: AORTA BIFEMORAL BYPASS GRAFT;  Surgeon: Larina Earthly, MD;  Location: [facility] OR;  Service: Vascular;  Laterality: N/A;  . Pr vein bypass graft,aorto-fem-pop  11/24/12   Family History  Problem Relation Age of Onset  . Hypertension Mother   . Heart attack Mother   . Diabetes Mother   . Aneurysm Father   . Heart attack Sister   . Heart disease Brother     heart transplant   History  Substance Use Topics  . 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 Smokeless tobacco: Not on file  . Alcohol Use: No     Comment: alcohol abuse in the past    Review of Systems  Constitutional: Negative for fever.  HENT: Positive for neck pain.   Respiratory:  Negative for shortness of breath.   Cardiovascular: Positive for chest pain.  All other systems reviewed and are negative.    Allergies  Penicillins and Zetia  Home Medications   Current Outpatient Rx  Name  Route  Sig  Dispense  Refill  . aspirin 325 MG EC tablet   Oral   Take 325 mg by mouth daily.         Marland Kitchen glipiZIDE (GLUCOTROL) 10 MG tablet   Oral   Take 10 mg by mouth 2 (two) times daily before a meal.           . phenytoin (DILANTIN) 100 MG ER capsule   Oral   Take 200-300 mg by mouth See admin instructions. Take 200 mg in the morning & 300 mg in the evening.         . zolpidem (AMBIEN) 5 MG tablet   Oral   Take 5 mg by mouth at bedtime as needed. For sleep          BP 148/82  Pulse 63  Temp(Src) 97.7 F (36.5 C) (Oral)  Resp 16  SpO2 98% Physical Exam  Nursing note and vitals  reviewed. Constitutional: He is oriented to person, place, and time. He appears well-developed and well-nourished.  HENT:  Head: Normocephalic and atraumatic.  Eyes: Conjunctivae and EOM are normal. Pupils are equal, round, and reactive to light.  Neck: Normal range of motion. Neck supple.  No carotid bruit.   Cardiovascular: Normal rate, regular rhythm and normal heart sounds.   Radial pulses are normal bilaterally.   Pulmonary/Chest: Effort normal and breath sounds normal. No respiratory distress. He has no wheezes. He has no rales.  Abdominal: Soft. Bowel sounds are normal.  No abdominal bruit. No pulsatile abdominal mass.   Musculoskeletal: Normal range of motion.  Tenderness  to neck hyperextension. No significant midline spine tenderness.   Neurological: He is alert and oriented to person, place, and time.  Skin: Skin is warm and dry.  Psychiatric: He has a normal mood and affect.    ED Course  Procedures (including critical care time)  DIAGNOSTIC STUDIES: Oxygen Saturation is 98% on RA, normal by my interpretation.    COORDINATION OF CARE: 3:40 PM Discussed treatment plan with pt at bedside and pt agreed to plan.   Labs Reviewed - No data to display No results found. No diagnosis found.  MDM  BP 148/82  Pulse 63  Temp(Src) 97.7 F (36.5 C) (Oral)  Resp 16  SpO2 98%   I personally performed the services described in this documentation, which was scribed in my presence. The recorded information has been reviewed and is accurate.      Fayrene Helper, PA-C 06/03/13 1551

## 2013-06-03 NOTE — ED Notes (Signed)
Pt presents with 2-3 day h/o neck. Pt reports he was sleeping in a chair, awoke with pain.  Pt reports taking ASA without relief.  Pt reports pain radiates around into arms.

## 2013-06-03 NOTE — Telephone Encounter (Signed)
Pt called angry that his prescription was cancelled. Talked with Dr Fonnie Jarvis about pt's prescription and the fact that he had #90 filled on 6/23 and 6/2 #30. Dr Fonnie Jarvis suggested that pt f/u with Dr Linton Rump tomorrow about his chronic pain problems.

## 2013-06-03 NOTE — ED Provider Notes (Signed)
Medical screening examination/treatment/procedure(s) were conducted as a shared visit with non-physician practitioner(s) and myself.  I personally evaluated the patient during the encounter.  This 62 year old male has history of chronic leg pain on Percocet which she ran out of a couple weeks ago, he was like a refill of his Percocet, he now has 2-3 days of a constant left-sided neck pain worse with palpation or certain neck movements but occasionally radiates towards his left upper chest that is nonexertional and denies chest pain itself denies cough shortness breath exertional symptoms denies cervical spine pain denies radiation down his arm denies weakness or numbness to his left arm denies fever rash trauma abdominal pain vomiting or other concerns. He states he slept funny with this had not unusual position causing his left side of his neck to be tender and sore for the last few days and has been taking aspirin with partial improvement in the discomfort but he still had constant left-sided neck soft tissue tenderness without swelling for the last 2-3 days.  Examination cervical spine nontender the patient has reproducible left paracervical and left trapezius soft tissue tenderness with no tenderness of the left shoulder upper arm forearm wrist or hand radial pulse left arm intact capillary refill less than 2 seconds left hand normal light touch left arm and good strength the left arm 5/5 in the distributions of the deltoid median radial and ulnar nerve function  ECG: sinus bradycardia, ventricular rate 49, normal axis, normal intervals, septal Q waves, no significant change noted compared with prior ECG  Patient's platelet count is low and I believe outpatient follow up is reasonable and he agrees.  I doubt acute coronary syndrome, I doubt cervical radiculopathy, I doubt stroke, the patient is aware the emergency room and does not manage chronic pain however since he has new left-sided neck pain with  tenderness I will prescribe a small amount of Percocet and he will follow up with his primary care physician for chronic pain management.  His initial potassium level was hemolyzed so I have ordered a repeat potassium level.  Hurman Horn, MD 06/05/13 2108

## 2013-06-05 ENCOUNTER — Encounter (HOSPITAL_COMMUNITY): Payer: Self-pay | Admitting: *Deleted

## 2013-06-05 ENCOUNTER — Emergency Department (HOSPITAL_COMMUNITY): Payer: Medicaid Other

## 2013-06-05 ENCOUNTER — Inpatient Hospital Stay (HOSPITAL_COMMUNITY)
Admission: EM | Admit: 2013-06-05 | Discharge: 2013-06-08 | DRG: 247 | Disposition: A | Discharge status: Home / Self Care | Payer: Medicaid Other | Attending: Cardiology | Admitting: Cardiology

## 2013-06-05 DIAGNOSIS — Z7902 Long term (current) use of antithrombotics/antiplatelets: Secondary | ICD-10-CM

## 2013-06-05 DIAGNOSIS — G40909 Epilepsy, unspecified, not intractable, without status epilepticus: Secondary | ICD-10-CM | POA: Diagnosis present

## 2013-06-05 DIAGNOSIS — Z880 Allergy status to penicillin: Secondary | ICD-10-CM

## 2013-06-05 DIAGNOSIS — Z955 Presence of coronary angioplasty implant and graft: Secondary | ICD-10-CM

## 2013-06-05 DIAGNOSIS — I2582 Chronic total occlusion of coronary artery: Secondary | ICD-10-CM | POA: Diagnosis present

## 2013-06-05 DIAGNOSIS — N189 Chronic kidney disease, unspecified: Secondary | ICD-10-CM | POA: Diagnosis present

## 2013-06-05 DIAGNOSIS — I2589 Other forms of chronic ischemic heart disease: Secondary | ICD-10-CM | POA: Diagnosis present

## 2013-06-05 DIAGNOSIS — I739 Peripheral vascular disease, unspecified: Secondary | ICD-10-CM

## 2013-06-05 DIAGNOSIS — Z79899 Other long term (current) drug therapy: Secondary | ICD-10-CM

## 2013-06-05 DIAGNOSIS — I129 Hypertensive chronic kidney disease with stage 1 through stage 4 chronic kidney disease, or unspecified chronic kidney disease: Secondary | ICD-10-CM | POA: Diagnosis present

## 2013-06-05 DIAGNOSIS — Z833 Family history of diabetes mellitus: Secondary | ICD-10-CM

## 2013-06-05 DIAGNOSIS — F1210 Cannabis abuse, uncomplicated: Secondary | ICD-10-CM | POA: Diagnosis present

## 2013-06-05 DIAGNOSIS — I214 Non-ST elevation (NSTEMI) myocardial infarction: Principal | ICD-10-CM | POA: Diagnosis present

## 2013-06-05 DIAGNOSIS — E785 Hyperlipidemia, unspecified: Secondary | ICD-10-CM | POA: Diagnosis present

## 2013-06-05 DIAGNOSIS — E237 Disorder of pituitary gland, unspecified: Secondary | ICD-10-CM | POA: Diagnosis present

## 2013-06-05 DIAGNOSIS — F17200 Nicotine dependence, unspecified, uncomplicated: Secondary | ICD-10-CM | POA: Diagnosis present

## 2013-06-05 DIAGNOSIS — I70219 Atherosclerosis of native arteries of extremities with intermittent claudication, unspecified extremity: Secondary | ICD-10-CM | POA: Diagnosis present

## 2013-06-05 DIAGNOSIS — I498 Other specified cardiac arrhythmias: Secondary | ICD-10-CM | POA: Diagnosis present

## 2013-06-05 DIAGNOSIS — Z7982 Long term (current) use of aspirin: Secondary | ICD-10-CM

## 2013-06-05 DIAGNOSIS — E119 Type 2 diabetes mellitus without complications: Secondary | ICD-10-CM

## 2013-06-05 DIAGNOSIS — Z8249 Family history of ischemic heart disease and other diseases of the circulatory system: Secondary | ICD-10-CM

## 2013-06-05 DIAGNOSIS — I2510 Atherosclerotic heart disease of native coronary artery without angina pectoris: Secondary | ICD-10-CM | POA: Diagnosis present

## 2013-06-05 DIAGNOSIS — R42 Dizziness and giddiness: Secondary | ICD-10-CM | POA: Diagnosis not present

## 2013-06-05 LAB — CBC WITH DIFFERENTIAL/PLATELET
Eosinophils Absolute: 0.2 10*3/uL (ref 0.0–0.7)
Lymphs Abs: 2.7 10*3/uL (ref 0.7–4.0)
MCH: 31.7 pg (ref 26.0–34.0)
Neutro Abs: 3 10*3/uL (ref 1.7–7.7)
Neutrophils Relative %: 46 % (ref 43–77)
Platelets: 131 10*3/uL — ABNORMAL LOW (ref 150–400)
RBC: 4.79 MIL/uL (ref 4.22–5.81)
WBC: 6.4 10*3/uL (ref 4.0–10.5)

## 2013-06-05 LAB — CBC
HCT: 42.6 % (ref 39.0–52.0)
Hemoglobin: 14.7 g/dL (ref 13.0–17.0)
RBC: 4.65 MIL/uL (ref 4.22–5.81)
WBC: 6 10*3/uL (ref 4.0–10.5)

## 2013-06-05 LAB — GLUCOSE, CAPILLARY: Glucose-Capillary: 149 mg/dL — ABNORMAL HIGH (ref 70–99)

## 2013-06-05 LAB — BASIC METABOLIC PANEL
BUN: 10 mg/dL (ref 6–23)
CO2: 26 mEq/L (ref 19–32)
Calcium: 9.7 mg/dL (ref 8.4–10.5)
Chloride: 102 mEq/L (ref 96–112)
Chloride: 101 mEq/L (ref 96–112)
Creatinine, Ser: 0.59 mg/dL (ref 0.50–1.35)
GFR calc Af Amer: >90 mL/min (ref >90)
GFR calc non Af Amer: >90 mL/min (ref >90)
Glucose, Bld: 172 mg/dL — ABNORMAL HIGH (ref 70–99)
Potassium: 4.3 mEq/L (ref 3.5–5.1)
Sodium: 136 mEq/L (ref 135–145)
Sodium: 134 mEq/L — ABNORMAL LOW (ref 135–145)

## 2013-06-05 LAB — POCT I-STAT TROPONIN I: Troponin i, poc: 0.26 ng/mL (ref 0.00–0.08)

## 2013-06-05 LAB — PRO B NATRIURETIC PEPTIDE: Pro B Natriuretic peptide (BNP): 367.5 pg/mL — ABNORMAL HIGH (ref 0–125)

## 2013-06-05 LAB — PROTIME-INR
INR: 1 (ref 0.00–1.49)
Prothrombin Time: 13 seconds (ref 11.6–15.2)

## 2013-06-05 LAB — TROPONIN I: Troponin I: 0.51 ng/mL (ref <0.30)

## 2013-06-05 MED ORDER — POTASSIUM CHLORIDE CRYS ER 20 MEQ PO TBCR
20.0000 meq | EXTENDED_RELEASE_TABLET | Freq: Once | ORAL | Status: AC
  Administered 2013-06-05: 20 meq via ORAL
  Filled 2013-06-05: qty 1

## 2013-06-05 MED ORDER — OXYCODONE-ACETAMINOPHEN 5-325 MG PO TABS: 1.0000 | ORAL_TABLET | ORAL | Status: DC | PRN

## 2013-06-05 MED ORDER — ACETAMINOPHEN 325 MG PO TABS: 650.0000 mg | ORAL_TABLET | ORAL | Status: DC | PRN

## 2013-06-05 MED ORDER — NITROGLYCERIN IN D5W 200-5 MCG/ML-% IV SOLN
INTRAVENOUS | Status: AC
  Filled 2013-06-05: qty 250

## 2013-06-05 MED ORDER — ZOLPIDEM TARTRATE 5 MG PO TABS
5.0000 mg | ORAL_TABLET | Freq: Every evening | ORAL | Status: DC | PRN
  Administered 2013-06-06: 5 mg via ORAL
  Filled 2013-06-05: qty 1

## 2013-06-05 MED ORDER — PHENYTOIN SODIUM EXTENDED 100 MG PO CAPS
300.0000 mg | ORAL_CAPSULE | Freq: Every day | ORAL | Status: DC
  Administered 2013-06-05 – 2013-06-07 (×3): 300 mg via ORAL
  Filled 2013-06-05 (×5): qty 3

## 2013-06-05 MED ORDER — NITROGLYCERIN 0.4 MG SL SUBL: 0.4000 mg | SUBLINGUAL_TABLET | SUBLINGUAL | Status: DC | PRN

## 2013-06-05 MED ORDER — NITROGLYCERIN IN D5W 200-5 MCG/ML-% IV SOLN
2.0000 ug/min | INTRAVENOUS | Status: DC
  Administered 2013-06-05: 10 ug/min via INTRAVENOUS

## 2013-06-05 MED ORDER — ATORVASTATIN CALCIUM 80 MG PO TABS
80.0000 mg | ORAL_TABLET | Freq: Every day | ORAL | Status: DC
  Administered 2013-06-05 – 2013-06-07 (×3): 80 mg via ORAL
  Filled 2013-06-05 (×5): qty 1

## 2013-06-05 MED ORDER — OXYCODONE-ACETAMINOPHEN 5-325 MG PO TABS
1.0000 | ORAL_TABLET | Freq: Once | ORAL | Status: AC
  Administered 2013-06-05: 1 via ORAL
  Filled 2013-06-05: qty 1

## 2013-06-05 MED ORDER — MORPHINE SULFATE 4 MG/ML IJ SOLN
4.0000 mg | Freq: Once | INTRAMUSCULAR | Status: AC
  Administered 2013-06-05: 4 mg via INTRAVENOUS
  Filled 2013-06-05: qty 1

## 2013-06-05 MED ORDER — NICOTINE 21 MG/24HR TD PT24
21.0000 mg | MEDICATED_PATCH | Freq: Every day | TRANSDERMAL | Status: DC
  Administered 2013-06-05 – 2013-06-08 (×4): 21 mg via TRANSDERMAL
  Filled 2013-06-05 (×4): qty 1

## 2013-06-05 MED ORDER — SODIUM CHLORIDE 0.9 % IV SOLN: 250.0000 mL | INTRAVENOUS | Status: DC | PRN

## 2013-06-05 MED ORDER — MORPHINE SULFATE 4 MG/ML IJ SOLN
INTRAMUSCULAR | Status: AC
  Filled 2013-06-05: qty 1

## 2013-06-05 MED ORDER — ONDANSETRON HCL 4 MG/2ML IJ SOLN: 4.0000 mg | Freq: Four times a day (QID) | INTRAMUSCULAR | Status: DC | PRN

## 2013-06-05 MED ORDER — MORPHINE SULFATE 4 MG/ML IJ SOLN
4.0000 mg | INTRAMUSCULAR | Status: DC | PRN
  Administered 2013-06-05 – 2013-06-08 (×12): 4 mg via INTRAVENOUS
  Filled 2013-06-05 (×13): qty 1

## 2013-06-05 MED ORDER — SODIUM CHLORIDE 0.9 % IJ SOLN
3.0000 mL | Freq: Two times a day (BID) | INTRAMUSCULAR | Status: DC
  Administered 2013-06-06: 09:00:00 via INTRAVENOUS

## 2013-06-05 MED ORDER — HEPARIN (PORCINE) IN NACL 100-0.45 UNIT/ML-% IJ SOLN
1200.0000 [IU]/h | INTRAMUSCULAR | Status: DC
  Administered 2013-06-05: 1000 [IU]/h via INTRAVENOUS
  Administered 2013-06-06: 1200 [IU]/h via INTRAVENOUS
  Filled 2013-06-05 (×4): qty 250

## 2013-06-05 MED ORDER — SODIUM CHLORIDE 0.9 % IJ SOLN: 3.0000 mL | INTRAMUSCULAR | Status: DC | PRN

## 2013-06-05 MED ORDER — OXYCODONE-ACETAMINOPHEN 5-325 MG PO TABS
2.0000 | ORAL_TABLET | ORAL | Status: DC | PRN
  Administered 2013-06-06 – 2013-06-08 (×11): 2 via ORAL
  Filled 2013-06-05 (×12): qty 2

## 2013-06-05 MED ORDER — HEPARIN BOLUS VIA INFUSION
4000.0000 [IU] | Freq: Once | INTRAVENOUS | Status: AC
  Administered 2013-06-05: 4000 [IU] via INTRAVENOUS

## 2013-06-05 MED ORDER — ASPIRIN EC 81 MG PO TBEC
81.0000 mg | DELAYED_RELEASE_TABLET | Freq: Every day | ORAL | Status: DC
  Administered 2013-06-06 – 2013-06-08 (×2): 81 mg via ORAL
  Filled 2013-06-05 (×3): qty 1

## 2013-06-05 MED ORDER — PHENYTOIN SODIUM EXTENDED 100 MG PO CAPS
200.0000 mg | ORAL_CAPSULE | Freq: Every day | ORAL | Status: DC
  Administered 2013-06-06 – 2013-06-08 (×3): 200 mg via ORAL
  Filled 2013-06-05 (×3): qty 2

## 2013-06-05 NOTE — H&P (Addendum)
MD SMOLA is an 62 y.o. male.   Chief Complaint: left neck/jaw pain; some chest pain HPI: 62 yo man with PMH of T2DM on oral therapy, dyslipidemia, hypertension, seizure disorder, peripheral vascular disease with prior aorto-bifemoral bypass 12/12 who comes to the [facility] for the second time after having 3 days of intermittent left neck/jaw pain. He tells he the pain started 3 days ago and was associated with a couple of nights where he just fell asleep sitting bolt upright. He initially had some left neck pain that improved with 5-6 large 325 mg aspirin but came by afterwards and involved his jaw as well. The pain came and went and he actually came to the hospital 7/10 but nothing was found so he went home. He tried to fill a prescription for narcotics and was told he had too many prescriptions so he knew he couldn't go to his PCP and tried to tough it out at home. He began having pain with some radiation down his arm ultimately leading him to come to the ER again. He has had some mild substernal chest pain as well. His neck/jaw/chest pain will come and go and worsens a bit with movement and improves with rest. In the ER he received some nitroglycerin and then heparin gtt with improvement/near resolution of pain. We discussed findings and concern for NSTEMI over the last 3 days and likely need to have heart catheterization on Monday unless symptoms or ECG change.   Past Medical History  Diagnosis Date  . Diabetes mellitus   . Hyperlipidemia   . Hypertension   . Arthritis   . Leg pain   . Seizure disorder   . Myocardial infarction     "silent heart attack by patient"  . Peripheral vascular disease   . Bronchitis   . 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 Seizures     last seizure "years ago"  . Coronary artery disease   . Chronic kidney disease     hx UTI, and kidney stones    Past Surgical History  Procedure Laterality Date  .  Thrombectomy      left to right fem-fem bypass  . Hernia repair    . Aortogram with iliac artery stenting  05/06/2011  . Tonsillectomy    . Aorta - bilateral femoral artery bypass graft  11/25/2011    Procedure: AORTA BIFEMORAL BYPASS GRAFT;  Surgeon: Larina Earthly, MD;  Location: [facility] OR;  Service: Vascular;  Laterality: N/A;  . Pr vein bypass graft,aorto-fem-pop  11/24/12    Family History  Problem Relation Age of Onset  . Hypertension Mother   . Heart attack Mother   . Diabetes Mother   . Aneurysm Father   . Heart attack Sister   . Heart disease Brother     heart transplant   Social History:  reports that he has been smoking Cigarettes.  He has a 44 pack-year smoking history. He does not have any smokeless tobacco history on file. He reports that he uses illicit drugs (Marijuana). He reports that he does not drink alcohol. Family history: no known CAD  Allergies:  Allergies  Allergen Reactions  . Penicillins Swelling  . Zetia (Ezetimibe) Swelling    Aches all over, short of breath     (Not in a hospital admission)  Results for orders placed during the hospital encounter of 06/05/13 (from the past 48 hour(s))  CBC  Status: None   Collection Time    06/05/13  4:23 PM      Result Value Range   WBC 6.0  4.0 - 10.5 K/uL   RBC 4.65  4.22 - 5.81 MIL/uL   Hemoglobin 14.7  13.0 - 17.0 g/dL   HCT 02.7  25.3 - 66.4 %   MCV 91.6  78.0 - 100.0 fL   MCH 31.6  26.0 - 34.0 pg   MCHC 34.5  30.0 - 36.0 g/dL   RDW 40.3  47.4 - 25.9 %   Platelets 167  150 - 400 K/uL  BASIC METABOLIC PANEL     Status: Abnormal   Collection Time    06/05/13  4:23 PM      Result Value Range   Sodium 136  135 - 145 mEq/L   Potassium 4.3  3.5 - 5.1 mEq/L   Chloride 102  96 - 112 mEq/L   CO2 26  19 - 32 mEq/L   Glucose, Bld 172 (*) 70 - 99 mg/dL   BUN 10  6 - 23 mg/dL   Creatinine, Ser 5.63  0.50 - 1.35 mg/dL   Calcium 9.7  8.4 - 87.5 mg/dL   GFR calc non Af Amer >90  >90 mL/min   GFR calc Af  Amer >90  >90 mL/min   Comment:            The eGFR has been calculated     using the CKD EPI equation.     This calculation has not been     validated in all clinical     situations.     eGFR's persistently     <90 mL/min signify     possible Chronic Kidney Disease.  POCT I-STAT TROPONIN I     Status: Abnormal   Collection Time    06/05/13  5:11 PM      Result Value Range   Troponin i, poc 0.29 (*) 0.00 - 0.08 ng/mL   Comment NOTIFIED PHYSICIAN     Comment 3            Comment: Due to the release kinetics of cTnI,     a negative result within the first hours     of the onset of symptoms does not rule out     myocardial infarction with certainty.     If myocardial infarction is still suspected,     repeat the test at appropriate intervals.  TROPONIN I     Status: Abnormal   Collection Time    06/05/13  5:39 PM      Result Value Range   Troponin I 0.51 (*) <0.30 ng/mL   Comment:            Due to the release kinetics of cTnI,     a negative result within the first hours     of the onset of symptoms does not rule out     myocardial infarction with certainty.     If myocardial infarction is still suspected,     repeat the test at appropriate intervals.     CRITICAL RESULT CALLED TO, READ BACK BY AND VERIFIED WITH:     RINGLEY,H RN 06/05/13 1858 WOOTEN,K  CBC WITH DIFFERENTIAL     Status: Abnormal   Collection Time    06/05/13  5:53 PM      Result Value Range   WBC 6.4  4.0 - 10.5 K/uL   RBC 4.79  4.22 - 5.81 MIL/uL  Hemoglobin 15.2  13.0 - 17.0 g/dL   HCT 16.1  09.6 - 04.5 %   MCV 91.4  78.0 - 100.0 fL   MCH 31.7  26.0 - 34.0 pg   MCHC 34.7  30.0 - 36.0 g/dL   RDW 40.9  81.1 - 91.4 %   Platelets 131 (*) 150 - 400 K/uL   Neutrophils Relative % 46  43 - 77 %   Neutro Abs 3.0  1.7 - 7.7 K/uL   Lymphocytes Relative 42  12 - 46 %   Lymphs Abs 2.7  0.7 - 4.0 K/uL   Monocytes Relative 7  3 - 12 %   Monocytes Absolute 0.5  0.1 - 1.0 K/uL   Eosinophils Relative 4  0 - 5 %    Eosinophils Absolute 0.2  0.0 - 0.7 K/uL   Basophils Relative 1  0 - 1 %   Basophils Absolute 0.1  0.0 - 0.1 K/uL  PROTIME-INR     Status: None   Collection Time    06/05/13  5:53 PM      Result Value Range   Prothrombin Time 13.0  11.6 - 15.2 seconds   INR 1.00  0.00 - 1.49  BASIC METABOLIC PANEL     Status: Abnormal   Collection Time    06/05/13  5:53 PM      Result Value Range   Sodium 134 (*) 135 - 145 mEq/L   Potassium 3.8  3.5 - 5.1 mEq/L   Chloride 101  96 - 112 mEq/L   CO2 27  19 - 32 mEq/L   Glucose, Bld 137 (*) 70 - 99 mg/dL   BUN 10  6 - 23 mg/dL   Creatinine, Ser 7.82  0.50 - 1.35 mg/dL   Calcium 9.7  8.4 - 95.6 mg/dL   GFR calc non Af Amer >90  >90 mL/min   GFR calc Af Amer >90  >90 mL/min   Comment:            The eGFR has been calculated     using the CKD EPI equation.     This calculation has not been     validated in all clinical     situations.     eGFR's persistently     <90 mL/min signify     possible Chronic Kidney Disease.  POCT I-STAT TROPONIN I     Status: Abnormal   Collection Time    06/05/13  6:20 PM      Result Value Range   Troponin i, poc 0.26 (*) 0.00 - 0.08 ng/mL   Comment NOTIFIED PHYSICIAN     Comment 3            Comment: Due to the release kinetics of cTnI,     a negative result within the first hours     of the onset of symptoms does not rule out     myocardial infarction with certainty.     If myocardial infarction is still suspected,     repeat the test at appropriate intervals.   Dg Chest 2 View  06/05/2013   *RADIOLOGY REPORT*  Clinical Data: Chest pain  CHEST - 2 VIEW  Comparison: 05/29/2012  Findings: The lungs are clear.  Negative for pneumonia.  Lung volume is normal.  Heart size and vascularity normal.  No mass lesion is identified.  IMPRESSION: Negative   Original Report Authenticated By: Janeece Riggers, M.D.    Review of Systems  Constitutional: Positive for  weight loss. Negative for fever, chills and diaphoresis.   HENT: Positive for neck pain. Negative for hearing loss and tinnitus.   Eyes: Negative for blurred vision, double vision and photophobia.  Respiratory: Positive for cough. Negative for hemoptysis and sputum production.   Cardiovascular: Positive for chest pain and claudication. Negative for palpitations, orthopnea, leg swelling and PND.  Gastrointestinal: Negative for heartburn, nausea, vomiting and diarrhea.  Genitourinary: Negative for dysuria, urgency and frequency.  Musculoskeletal: Negative for myalgias and back pain.  Skin: Negative for itching and rash.  Neurological: Negative for dizziness, tingling and headaches.  Endo/Heme/Allergies: Negative for environmental allergies. Does not bruise/bleed easily.  Psychiatric/Behavioral: Negative for depression and suicidal ideas.    Blood pressure 150/91, pulse 49, temperature 97.6 F (36.4 C), temperature source Oral, resp. rate 15, height 6\' 3"  (1.905 m), weight 73.936 kg (163 lb), SpO2 100.00%. Physical Exam  Nursing note and vitals reviewed. Constitutional: He is oriented to person, place, and time. No distress.  Thin man in NAD  HENT:  Head: Normocephalic and atraumatic.  Nose: Nose normal.  Mouth/Throat: Oropharynx is clear and moist. No oropharyngeal exudate.  Eyes: Conjunctivae and EOM are normal. Pupils are equal, round, and reactive to light. No scleral icterus.  Neck: Normal range of motion. Neck supple. No JVD present. No tracheal deviation present. No thyromegaly present.  Cardiovascular: Regular rhythm and normal heart sounds.   No murmur heard. Sinus bradycardia; known minimal to no distal pulses with prior aorta-fem bypass  Respiratory: Effort normal and breath sounds normal. No respiratory distress. He has no wheezes.  GI: Soft. Bowel sounds are normal. He exhibits no distension. There is no tenderness. There is no rebound.  Musculoskeletal: Normal range of motion. He exhibits no edema and no tenderness.  Neurological:  He is alert and oriented to person, place, and time. No cranial nerve deficit.  Skin: Skin is warm and dry. No rash noted. He is not diaphoretic. No erythema.  Psychiatric: He has a normal mood and affect. His behavior is normal.   labs reviewed; wbc 6.4, plt 131, h/h 15/44, na 134, K 3.8, inr 1.0, bun/cr 10/0.6, glucose 130s Chest x-ray reviewed; notable bilateral hilar fullness by my read ECG: sinus bradycardia/NSR, borderline RAD Troponin 0.51 12/12 Echo reviewed; EF 45-50%, hypokinetic inferior/basal-mid inferolateral wall, mild MR, dilated RA  Problem List NSTEMI Left neck/jaw/chest pain Peripheral vascular disease Elevated Troponin Hypertension Dyslipidemia Diabetes Mellitus Tobacco abuse Seizure disorder - no seizure in years  Assessment/Plan 62 yo man with PMH of PVD, prior aorto-bi-femoral bypass, dyslipidemia, tobacco abuse, hypertension, seizure disorder with left neck/chest/jaw pain found to have elevated troponin and history concerning for NSTEMI. Differential diagnosis is ACS/NSTEMI, heart failure, other vascular progression, subclavian steal syndrome, atypical chest pain among other etiologies. It appears to be an NSTEMI, perhaps ongoing for the past few days. He may have concomitant other vascular disease. We discussed need for likely LHC on Monday, smoking cessation and need for likely dual platelet inhibition for 1 year if stent necessary - NPO after MN Sunday for likely AM [facility] Monday - large aspirin at home, heparin gtt with pharmacy dosing - smoking cessation - nicotine patch if necessary - trend troponins, step-down, on telemetry - tsh, bnp, lipid panel in AM - obtain echo - no beta blocker ordered 2/2 HR sinus bradycardia 45-55 on monitor  Arthur Speagle 06/05/2013, 8:41 PM

## 2013-06-05 NOTE — ED Notes (Signed)
Dr.Lockwood shown results of Istat Trop.

## 2013-06-05 NOTE — ED Notes (Signed)
I gave the patient a small ice pack for his neck.

## 2013-06-05 NOTE — ED Notes (Signed)
Dr. Mora Bellman back in to speak with patient.

## 2013-06-05 NOTE — Progress Notes (Signed)
ANTICOAGULATION CONSULT NOTE - Initial Consult  Pharmacy Consult for Heparin Indication: chest pain/ACS  Allergies  Allergen Reactions  . Penicillins Swelling  . Zetia (Ezetimibe) Swelling    Aches all over, short of breath    Patient Measurements:   Heparin Dosing Weight:  73.9 kg  Vital Signs: Temp: 97.6 F (36.4 C) (07/12 1609) Temp src: Oral (07/12 1609) BP: 154/95 mmHg (07/12 1744) Pulse Rate: 66 (07/12 1609)  Labs:  Recent Labs  06/03/13 1543 06/05/13 1623  HGB 15.7 14.7  HCT 44.3 42.6  PLT 82* 167  CREATININE 0.53 0.65  TROPONINI <0.30  --     The CrCl is unknown because both a height and weight (above a minimum accepted value) are required for this calculation.   Medical History: Past Medical History  Diagnosis Date  . Diabetes mellitus   . Hyperlipidemia   . Hypertension   . Arthritis   . Leg pain   . Seizure disorder   . Myocardial infarction     "silent heart attack by patient"  . Peripheral vascular disease   . Bronchitis   . 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 Seizures     last seizure "years ago"  . Coronary artery disease   . Chronic kidney disease     hx UTI, and kidney stones    Medications: ASA, Glipizide, Percocet, Dilantin, Ambien  Assessment: CP, L neck and jaw pain with radiation down his L arm Patient does have a know h/o cardiac dz. I-stat troponin elevated at 0.29. CBC WNL.  Goal of Therapy:  Heparin level 0.3-0.7 units/ml Monitor platelets by anticoagulation protocol: Yes   Plan:  Heparin 4000 unit IV bolus Heparin infusion 1000 units/hr Check heparin level in 6-8 hrs and daily.   Tyreque Finken S. Merilynn Finland, PharmD, BCPS Clinical Staff Pharmacist Pager [phone]  Misty Stanley Stillinger 06/05/2013,6:08 PM

## 2013-06-05 NOTE — ED Provider Notes (Signed)
I evaluated the patient in conjunction with the resident physician.  I saw the relevant studies, including the ecg (as needed) and agree with the interpretation. The documentation is accurate with the following additional / clarifications:  This patient presents with left-sided neck and chest pain.  The patient has an unusual story for angina, he did have elevated troponin, without notable ECG changes.  With his history of prior ischemic disease, vasculopathy, other multiple medical problems, there was immediate suspicion for a non-ST elevation MI. On chart review, and in discussion with the patient is clear the patient has previously been told of prior similar NSTEMI. Although the patient took aspirin, and he required anticoagulation with heparin. The patient was admitted to the cardiology team with his critical NSTEMI. On multiple repeat evaluation as the patient's vital signs remained consistent. Repeat troponin was a similarly elevated, furthering the concern NSTEMI.  CRITICAL CARE Performed by: Gerhard Munch Total critical care time: 35 Critical care time was exclusive of separately billable procedures and treating other patients. Critical care was necessary to treat or prevent imminent or life-threatening deterioration. Critical care was time spent personally by me on the following activities: development of treatment plan with patient and/or surrogate as well as nursing, discussions with consultants, evaluation of patient's response to treatment, examination of patient, obtaining history from patient or surrogate, ordering and performing treatments and interventions, ordering and review of laboratory studies, ordering and review of radiographic studies, pulse oximetry and re-evaluation of patient's condition.     Gerhard Munch, MD 06/05/13 [phone]

## 2013-06-05 NOTE — ED Provider Notes (Signed)
History    CSN: [id number] Arrival date & time 06/05/13  1555  First MD Initiated Contact with Patient 06/05/13 1737     No chief complaint on file.  (Consider location/radiation/quality/duration/timing/severity/associated sxs/prior Treatment) HPI Comments: [name] [name] is a 62 y.o. male here with CP, L neck and jaw pain with radiation down his L arm.  Patient states it started when he was sleeping sitting up with his neck in an awkward position, however he also states it is worse with exertion.  He develops this chest pain with the above radiation, SOB.  He denies emesis or diaphoresis.  He denies previous cardiac history.  ROS for the patient is otherwise negative.  The history is provided by the patient.   Past Medical History  Diagnosis Date  . Diabetes mellitus   . Hyperlipidemia   . Hypertension   . Arthritis   . Leg pain   . Seizure disorder   . Myocardial infarction     "silent heart attack by patient"  . Peripheral vascular disease   . Bronchitis   . 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 Seizures     last seizure "years ago"  . Coronary artery disease   . Chronic kidney disease     hx UTI, and kidney stones   Past Surgical History  Procedure Laterality Date  . Thrombectomy      left to right fem-fem bypass  . Hernia repair    . Aortogram with iliac artery stenting  05/06/2011  . Tonsillectomy    . Aorta - bilateral femoral artery bypass graft  11/25/2011    Procedure: AORTA BIFEMORAL BYPASS GRAFT;  Surgeon: Larina Earthly, MD;  Location: [facility] OR;  Service: Vascular;  Laterality: N/A;  . Pr vein bypass graft,aorto-fem-pop  11/24/12   Family History  Problem Relation Age of Onset  . Hypertension Mother   . Heart attack Mother   . Diabetes Mother   . Aneurysm Father   . Heart attack Sister   . Heart disease Brother     heart transplant   History  Substance Use Topics  . Smoking status: Current Every Day  Smoker -- 1.00 packs/day for 44 years    Types: Cigarettes  . Smokeless tobacco: Not on file  . Alcohol Use: No     Comment: alcohol abuse in the past    Review of Systems 10 Systems reviewed and are negative for acute change except as noted in the HPI.  Allergies  Penicillins and Zetia  Home Medications   Current Outpatient Rx  Name  Route  Sig  Dispense  Refill  . aspirin 325 MG EC tablet   Oral   Take 325 mg by mouth daily.         Marland Kitchen glipiZIDE (GLUCOTROL) 10 MG tablet   Oral   Take 10 mg by mouth 2 (two) times daily before a meal.           . oxyCODONE-acetaminophen (PERCOCET) 5-325 MG per tablet   Oral   Take 2 tablets by mouth every 6 (six) hours as needed for pain.   20 tablet   0   . phenytoin (DILANTIN) 100 MG ER capsule   Oral   Take 200-300 mg by mouth See admin instructions. Take 200 mg in the morning & 300 mg in the evening.         . zolpidem (AMBIEN) 5 MG  tablet   Oral   Take 5 mg by mouth at bedtime as needed. For sleep          BP 154/95  Pulse 66  Temp(Src) 97.6 F (36.4 C) (Oral)  Resp 14  SpO2 100% Physical Exam  Nursing note and vitals reviewed. Constitutional: He is oriented to person, place, and time. Vital signs are normal. He appears well-developed and well-nourished.  Non-toxic appearance. He does not appear ill. No distress.  HENT:  Head: Normocephalic and atraumatic.  Nose: Nose normal.  Mouth/Throat: Oropharynx is clear and moist. No oropharyngeal exudate.  Eyes: Conjunctivae and EOM are normal. Pupils are equal, round, and reactive to light. No scleral icterus.  Neck: Normal range of motion. Neck supple. No tracheal deviation, no edema, no erythema and normal range of motion present. No mass and no thyromegaly present.  No tenderness to palpation, ice pack placed on neck.  Cardiovascular: Regular rhythm, S1 normal, S2 normal, normal heart sounds, intact distal pulses and normal pulses.  Exam reveals no gallop and no friction  rub.   No murmur heard. Pulses:      Radial pulses are 2+ on the right side, and 2+ on the left side.       Dorsalis pedis pulses are 2+ on the right side, and 2+ on the left side.  bradycardia  Pulmonary/Chest: Effort normal and breath sounds normal. No respiratory distress. He has no wheezes. He has no rhonchi. He has no rales.  Abdominal: Soft. Normal appearance and bowel sounds are normal. He exhibits no distension, no ascites and no mass. There is no hepatosplenomegaly. There is no tenderness. There is no rebound, no guarding and no CVA tenderness.  Musculoskeletal: Normal range of motion. He exhibits no edema and no tenderness.  Lymphadenopathy:    He has no cervical adenopathy.  Neurological: He is alert and oriented to person, place, and time. He has normal strength. No cranial nerve deficit or sensory deficit. GCS eye subscore is 4. GCS verbal subscore is 5. GCS motor subscore is 6.  Skin: Skin is warm, dry and intact. No petechiae and no rash noted. He is not diaphoretic. No erythema. No pallor.  Psychiatric: He has a normal mood and affect. His behavior is normal. Judgment normal.    ED Course  Procedures (including critical care time) Labs Reviewed  BASIC METABOLIC PANEL - Abnormal; Notable for the following:    Glucose, Bld 172 (*)    All other components within normal limits  TROPONIN I - Abnormal; Notable for the following:    Troponin I 0.51 (*)    All other components within normal limits  CBC WITH DIFFERENTIAL - Abnormal; Notable for the following:    Platelets 131 (*)    All other components within normal limits  BASIC METABOLIC PANEL - Abnormal; Notable for the following:    Sodium 134 (*)    Glucose, Bld 137 (*)    All other components within normal limits  POCT I-STAT TROPONIN I - Abnormal; Notable for the following:    Troponin i, poc 0.29 (*)    All other components within normal limits  POCT I-STAT TROPONIN I - Abnormal; Notable for the following:     Troponin i, poc 0.26 (*)    All other components within normal limits  CBC  PROTIME-INR  HEPARIN LEVEL (UNFRACTIONATED)  HEPARIN LEVEL (UNFRACTIONATED)  CBC   Dg Chest 2 View  06/05/2013   *RADIOLOGY REPORT*  Clinical Data: Chest pain  CHEST -  2 VIEW  Comparison: 05/29/2012  Findings: The lungs are clear.  Negative for pneumonia.  Lung volume is normal.  Heart size and vascularity normal.  No mass lesion is identified.  IMPRESSION: Negative   Original Report Authenticated By: Janeece Riggers, M.D.   No diagnosis found.  Date: 06/05/2013  Rate: 60  Rhythm: sinus bradycardia  QRS Axis: indeterminate  Intervals: normal  ST/T Wave abnormalities: normal  Conduction Disutrbances:none  Narrative Interpretation:   Old EKG Reviewed: none available   MDM  Patient expresses a story concerning for ACS.  He already took an aspirin today.  He will be given nitro and morphine for pain relief.  Heparin gtt was ordered as well and the patient states he is amendable to admission.   I spoke with Dr. Tresa Endo with cardiology who will evaluate the patient in the ED for possible admission.  Patient is amendable with this plan.  Tomasita Crumble, MD 06/05/13 Windell Moment

## 2013-06-05 NOTE — ED Notes (Signed)
Pt is here with neck pain that radiates down neck, shoulder, and back.  Pt states that pain started after sleeping sitting straight up.  Pt states the pain has been increasing for the last 3 days.  Pt states that he has chronic pain and had lost his pain pills.  The neck pain is new for patient

## 2013-06-05 NOTE — ED Notes (Signed)
Flow Manager notified that Dr. Leeann Must is going to change patient's bed from Tele to Step Down.

## 2013-06-06 DIAGNOSIS — I059 Rheumatic mitral valve disease, unspecified: Secondary | ICD-10-CM

## 2013-06-06 DIAGNOSIS — I214 Non-ST elevation (NSTEMI) myocardial infarction: Secondary | ICD-10-CM

## 2013-06-06 LAB — CBC WITH DIFFERENTIAL/PLATELET
Basophils Absolute: 0.1 10*3/uL (ref 0.0–0.1)
Basophils Relative: 1 % (ref 0–1)
HCT: 39.8 % (ref 39.0–52.0)
Hemoglobin: 13.6 g/dL (ref 13.0–17.0)
Lymphs Abs: 3.3 10*3/uL (ref 0.7–4.0)
MCH: 31.3 pg (ref 26.0–34.0)
MCHC: 34.2 g/dL (ref 30.0–36.0)
Monocytes Absolute: 0.4 10*3/uL (ref 0.1–1.0)
Monocytes Relative: 7 % (ref 3–12)
Neutro Abs: 2.1 10*3/uL (ref 1.7–7.7)
RBC: 4.35 MIL/uL (ref 4.22–5.81)

## 2013-06-06 LAB — COMPREHENSIVE METABOLIC PANEL
AST: 14 U/L (ref 0–37)
Albumin: 3.3 g/dL — ABNORMAL LOW (ref 3.5–5.2)
Calcium: 9 mg/dL (ref 8.4–10.5)
Creatinine, Ser: 0.59 mg/dL (ref 0.50–1.35)

## 2013-06-06 LAB — HEPARIN LEVEL (UNFRACTIONATED)
Heparin Unfractionated: 0.19 IU/mL — ABNORMAL LOW (ref 0.30–0.70)
Heparin Unfractionated: 0.54 IU/mL (ref 0.30–0.70)

## 2013-06-06 LAB — CBC
HCT: 39.8 % (ref 39.0–52.0)
MCHC: 33.9 g/dL (ref 30.0–36.0)
MCV: 91.7 fL (ref 78.0–100.0)
RDW: 13.6 % (ref 11.5–15.5)

## 2013-06-06 LAB — TROPONIN I: Troponin I: 0.33 ng/mL (ref <0.30)

## 2013-06-06 LAB — BASIC METABOLIC PANEL
BUN: 10 mg/dL (ref 6–23)
CO2: 24 mEq/L (ref 19–32)
Chloride: 105 mEq/L (ref 96–112)
Creatinine, Ser: 0.55 mg/dL (ref 0.50–1.35)

## 2013-06-06 LAB — GLUCOSE, CAPILLARY: Glucose-Capillary: 100 mg/dL — ABNORMAL HIGH (ref 70–99)

## 2013-06-06 LAB — HEMOGLOBIN A1C
Hgb A1c MFr Bld: 7.1 % — ABNORMAL HIGH (ref <5.7)
Mean Plasma Glucose: 157 mg/dL — ABNORMAL HIGH (ref <117)

## 2013-06-06 LAB — LIPID PANEL
Cholesterol: 271 mg/dL — ABNORMAL HIGH (ref 0–200)
Triglycerides: 125 mg/dL (ref <150)

## 2013-06-06 LAB — TSH: TSH: 1.462 u[IU]/mL (ref 0.350–4.500)

## 2013-06-06 MED ORDER — HEPARIN BOLUS VIA INFUSION
2000.0000 [IU] | Freq: Once | INTRAVENOUS | Status: AC
  Administered 2013-06-06: 2000 [IU] via INTRAVENOUS
  Filled 2013-06-06: qty 2000

## 2013-06-06 NOTE — Progress Notes (Signed)
ANTICOAGULATION CONSULT NOTE  Pharmacy Consult for Heparin Indication: chest pain/ACS  Allergies  Allergen Reactions  . Penicillins Swelling  . Zetia (Ezetimibe) Swelling    Aches all over, short of breath    Patient Measurements: Height: 6\' 3"  (190.5 cm) Weight: 169 lb 12.1 oz (77 kg) IBW/kg (Calculated) : 84.5 Heparin Dosing Weight:  73.9 kg  Vital Signs: Temp: 97.9 F (36.6 C) (07/12 2151) Temp src: Oral (07/12 2151) BP: 123/72 mmHg (07/13 0000) Pulse Rate: 48 (07/13 0000)  Labs:  Recent Labs  06/03/13 1543 06/05/13 1623 06/05/13 1739 06/05/13 1753 06/05/13 2152 06/06/13 0345  HGB 15.7 14.7  --  15.2  --  13.5  13.6  HCT 44.3 42.6  --  43.8  --  39.8  39.8  PLT 82* 167  --  131*  --  165  151  LABPROT  --   --   --  13.0  --   --   INR  --   --   --  1.00  --   --   HEPARINUNFRC  --   --   --   --   --  0.19*  CREATININE 0.53 0.65  --  0.59  --   --   TROPONINI <0.30  --  0.51*  --  0.55*  --     Estimated Creatinine Clearance: 106.9 ml/min (by C-G formula based on Cr of 0.59).  Assessment: 62 yo male with chest pain for heparin  Goal of Therapy:  Heparin level 0.3-0.7 units/ml Monitor platelets by anticoagulation protocol: Yes  Plan: Heparin 2000 units IV bolus, then increase heparin 1200 units/hr Check heparin level in 6 hours.   Eddie Candle 06/06/2013,4:55 AM

## 2013-06-06 NOTE — Progress Notes (Signed)
ANTICOAGULATION CONSULT NOTE - Follow up Consult  Pharmacy Consult for Heparin Indication: chest pain/ACS  Allergies  Allergen Reactions  . Penicillins Swelling  . Zetia (Ezetimibe) Swelling    Aches all over, short of breath    Patient Measurements: Height: 6\' 3"  (190.5 cm) Weight: 169 lb 1.5 oz (76.7 kg) IBW/kg (Calculated) : 84.5 Heparin Dosing Weight:  73.9 kg  Vital Signs: Temp: 98.5 F (36.9 C) (07/13 1200) Temp src: Oral (07/13 1200) BP: 122/69 mmHg (07/13 1000) Pulse Rate: 50 (07/13 1000)  Labs:  Recent Labs  06/05/13 1623  06/05/13 1753 06/05/13 2152 06/06/13 0345 06/06/13 1133  HGB 14.7  --  15.2  --  13.5  13.6  --   HCT 42.6  --  43.8  --  39.8  39.8  --   PLT 167  --  131*  --  165  151  --   LABPROT  --   --  13.0  --   --   --   INR  --   --  1.00  --   --   --   HEPARINUNFRC  --   --   --   --  0.19* 0.54  CREATININE 0.65  --  0.59  --  0.59  0.55  --   TROPONINI  --   < >  --  0.55* 0.33* 0.37*  < > = values in this interval not displayed.  Estimated Creatinine Clearance: 106.5 ml/min (by C-G formula based on Cr of 0.59).   Assessment:  89 YOM admitted with 3 days of CP, likely NSTEMI, to go to cath on Monday 7/14. Started on heparin. Initial level low, then rebolused and rate increased to 1200 units/hr. Level recheck was therapeutic at 0.54. CBC is all WNL. No bleeding noted.  Goal of Therapy:  Heparin level 0.3-0.7 units/ml Monitor platelets by anticoagulation protocol: Yes   Plan:  1. Continue heparin gtt at 1200 units/hr 2. 6 hour HL to confirm rate 3. Daily HL and CBC 4. F/u results of cath  Kiah Keay D. Skipper Dacosta, PharmD Clinical Pharmacist Pager: [phone] 06/06/2013 1:09 PM

## 2013-06-06 NOTE — Progress Notes (Signed)
ANTICOAGULATION CONSULT NOTE - Follow up Consult  Pharmacy Consult for Heparin Indication: chest pain/ACS  Allergies  Allergen Reactions  . Penicillins Swelling  . Zetia (Ezetimibe) Swelling    Aches all over, short of breath    Patient Measurements: Height: 6\' 3"  (190.5 cm) Weight: 169 lb 1.5 oz (76.7 kg) IBW/kg (Calculated) : 84.5 Heparin Dosing Weight:  73.9 kg  Vital Signs: Temp: 97.7 F (36.5 C) (07/13 1600) Temp src: Oral (07/13 1600) BP: 132/76 mmHg (07/13 1600) Pulse Rate: 48 (07/13 1600)  Labs:  Recent Labs  06/05/13 1623  06/05/13 1753 06/05/13 2152 06/06/13 0345 06/06/13 1133 06/06/13 1728  HGB 14.7  --  15.2  --  13.5  13.6  --   --   HCT 42.6  --  43.8  --  39.8  39.8  --   --   PLT 167  --  131*  --  165  151  --   --   LABPROT  --   --  13.0  --   --   --   --   INR  --   --  1.00  --   --   --   --   HEPARINUNFRC  --   --   --   --  0.19* 0.54 0.63  CREATININE 0.65  --  0.59  --  0.59  0.55  --   --   TROPONINI  --   < >  --  0.55* 0.33* 0.37*  --   < > = values in this interval not displayed.  Estimated Creatinine Clearance: 106.5 ml/min (by C-G formula based on Cr of 0.59).   Assessment:  44 YOM admitted with 3 days of CP, likely NSTEMI, to go to cath on Monday 7/14. Started on heparin. Heparin level in goal range x 2, now 0.63  Goal of Therapy:  Heparin level 0.3-0.7 units/ml Monitor platelets by anticoagulation protocol: Yes   Plan:  1. Continue heparin gtt at 1200 units/hr   Seerat Peaden S. Merilynn Finland, PharmD, [facility] Clinical Staff Pharmacist Pager [phone]  06/06/2013 6:06 PM

## 2013-06-06 NOTE — Progress Notes (Signed)
Pt viewed cardiac cath video .

## 2013-06-06 NOTE — Progress Notes (Signed)
  Echocardiogram 2D Echocardiogram has been performed.  [name] [name] 06/06/2013, 12:22 PM

## 2013-06-06 NOTE — Progress Notes (Signed)
SUBJECTIVE:  He still complains of neck pain.  He is worried about the pulses in his feet.   PHYSICAL EXAM Filed Vitals:   06/06/13 0000 06/06/13 0229 06/06/13 0510 06/06/13 0800  BP: 123/72 108/61 101/61   Pulse: 48 51 53   Temp:   98 F (36.7 C) 97.7 F (36.5 C)  TempSrc:   Oral Oral  Resp: 11 14 16    Height:      Weight:   169 lb 1.5 oz (76.7 kg)   SpO2: 97% 96% 98%    General:  No distress HEENT:  PERRL Lungs:  Clear Heart:  RRR Abdomen:  Positive bowel sounds, no rebound no guarding Extremities:  No edema, unable to appreciate DP/PT with Doppler  LABS: Lab Results  Component Value Date   TROPONINI 0.33* 06/06/2013   Results for orders placed during the hospital encounter of 06/05/13 (from the past 24 hour(s))  CBC     Status: None   Collection Time    06/05/13  4:23 PM      Result Value Range   WBC 6.0  4.0 - 10.5 K/uL   RBC 4.65  4.22 - 5.81 MIL/uL   Hemoglobin 14.7  13.0 - 17.0 g/dL   HCT 16.1  09.6 - 04.5 %   MCV 91.6  78.0 - 100.0 fL   MCH 31.6  26.0 - 34.0 pg   MCHC 34.5  30.0 - 36.0 g/dL   RDW 40.9  81.1 - 91.4 %   Platelets 167  150 - 400 K/uL  BASIC METABOLIC PANEL     Status: Abnormal   Collection Time    06/05/13  4:23 PM      Result Value Range   Sodium 136  135 - 145 mEq/L   Potassium 4.3  3.5 - 5.1 mEq/L   Chloride 102  96 - 112 mEq/L   CO2 26  19 - 32 mEq/L   Glucose, Bld 172 (*) 70 - 99 mg/dL   BUN 10  6 - 23 mg/dL   Creatinine, Ser 7.82  0.50 - 1.35 mg/dL   Calcium 9.7  8.4 - 95.6 mg/dL   GFR calc non Af Amer >90  >90 mL/min   GFR calc Af Amer >90  >90 mL/min  POCT I-STAT TROPONIN I     Status: Abnormal   Collection Time    06/05/13  5:11 PM      Result Value Range   Troponin i, poc 0.29 (*) 0.00 - 0.08 ng/mL   Comment NOTIFIED PHYSICIAN     Comment 3           TROPONIN I     Status: Abnormal   Collection Time    06/05/13  5:39 PM      Result Value Range   Troponin I 0.51 (*) <0.30 ng/mL  CBC WITH DIFFERENTIAL     Status:  Abnormal   Collection Time    06/05/13  5:53 PM      Result Value Range   WBC 6.4  4.0 - 10.5 K/uL   RBC 4.79  4.22 - 5.81 MIL/uL   Hemoglobin 15.2  13.0 - 17.0 g/dL   HCT 21.3  08.6 - 57.8 %   MCV 91.4  78.0 - 100.0 fL   MCH 31.7  26.0 - 34.0 pg   MCHC 34.7  30.0 - 36.0 g/dL   RDW 46.9  62.9 - 52.8 %   Platelets 131 (*) 150 - 400 K/uL  Neutrophils Relative % 46  43 - 77 %   Neutro Abs 3.0  1.7 - 7.7 K/uL   Lymphocytes Relative 42  12 - 46 %   Lymphs Abs 2.7  0.7 - 4.0 K/uL   Monocytes Relative 7  3 - 12 %   Monocytes Absolute 0.5  0.1 - 1.0 K/uL   Eosinophils Relative 4  0 - 5 %   Eosinophils Absolute 0.2  0.0 - 0.7 K/uL   Basophils Relative 1  0 - 1 %   Basophils Absolute 0.1  0.0 - 0.1 K/uL  PROTIME-INR     Status: None   Collection Time    06/05/13  5:53 PM      Result Value Range   Prothrombin Time 13.0  11.6 - 15.2 seconds   INR 1.00  0.00 - 1.49  BASIC METABOLIC PANEL     Status: Abnormal   Collection Time    06/05/13  5:53 PM      Result Value Range   Sodium 134 (*) 135 - 145 mEq/L   Potassium 3.8  3.5 - 5.1 mEq/L   Chloride 101  96 - 112 mEq/L   CO2 27  19 - 32 mEq/L   Glucose, Bld 137 (*) 70 - 99 mg/dL   BUN 10  6 - 23 mg/dL   Creatinine, Ser 1.61  0.50 - 1.35 mg/dL   Calcium 9.7  8.4 - 09.6 mg/dL   GFR calc non Af Amer >90  >90 mL/min   GFR calc Af Amer >90  >90 mL/min  POCT I-STAT TROPONIN I     Status: Abnormal   Collection Time    06/05/13  6:20 PM      Result Value Range   Troponin i, poc 0.26 (*) 0.00 - 0.08 ng/mL   Comment NOTIFIED PHYSICIAN     Comment 3           MRSA PCR SCREENING     Status: None   Collection Time    06/05/13  9:51 PM      Result Value Range   MRSA by PCR NEGATIVE  NEGATIVE  TROPONIN I     Status: Abnormal   Collection Time    06/05/13  9:52 PM      Result Value Range   Troponin I 0.55 (*) <0.30 ng/mL  PRO B NATRIURETIC PEPTIDE     Status: Abnormal   Collection Time    06/05/13  9:52 PM      Result Value Range    Pro B Natriuretic peptide (BNP) 367.5 (*) 0 - 125 pg/mL  GLUCOSE, CAPILLARY     Status: Abnormal   Collection Time    06/05/13 10:38 PM      Result Value Range   Glucose-Capillary 149 (*) 70 - 99 mg/dL  HEPARIN LEVEL (UNFRACTIONATED)     Status: Abnormal   Collection Time    06/06/13  3:45 AM      Result Value Range   Heparin Unfractionated 0.19 (*) 0.30 - 0.70 IU/mL  CBC     Status: None   Collection Time    06/06/13  3:45 AM      Result Value Range   WBC 6.2  4.0 - 10.5 K/uL   RBC 4.34  4.22 - 5.81 MIL/uL   Hemoglobin 13.5  13.0 - 17.0 g/dL   HCT 04.5  40.9 - 81.1 %   MCV 91.7  78.0 - 100.0 fL   MCH 31.1  26.0 -  34.0 pg   MCHC 33.9  30.0 - 36.0 g/dL   RDW 78.2  95.6 - 21.3 %   Platelets 165  150 - 400 K/uL  TROPONIN I     Status: Abnormal   Collection Time    06/06/13  3:45 AM      Result Value Range   Troponin I 0.33 (*) <0.30 ng/mL  CBC WITH DIFFERENTIAL     Status: Abnormal   Collection Time    06/06/13  3:45 AM      Result Value Range   WBC 6.2  4.0 - 10.5 K/uL   RBC 4.35  4.22 - 5.81 MIL/uL   Hemoglobin 13.6  13.0 - 17.0 g/dL   HCT 08.6  57.8 - 46.9 %   MCV 91.5  78.0 - 100.0 fL   MCH 31.3  26.0 - 34.0 pg   MCHC 34.2  30.0 - 36.0 g/dL   RDW 62.9  52.8 - 41.3 %   Platelets 151  150 - 400 K/uL   Neutrophils Relative % 34 (*) 43 - 77 %   Neutro Abs 2.1  1.7 - 7.7 K/uL   Lymphocytes Relative 53 (*) 12 - 46 %   Lymphs Abs 3.3  0.7 - 4.0 K/uL   Monocytes Relative 7  3 - 12 %   Monocytes Absolute 0.4  0.1 - 1.0 K/uL   Eosinophils Relative 5  0 - 5 %   Eosinophils Absolute 0.3  0.0 - 0.7 K/uL   Basophils Relative 1  0 - 1 %   Basophils Absolute 0.1  0.0 - 0.1 K/uL  COMPREHENSIVE METABOLIC PANEL     Status: Abnormal   Collection Time    06/06/13  3:45 AM      Result Value Range   Sodium 137  135 - 145 mEq/L   Potassium 3.8  3.5 - 5.1 mEq/L   Chloride 105  96 - 112 mEq/L   CO2 25  19 - 32 mEq/L   Glucose, Bld 123 (*) 70 - 99 mg/dL   BUN 10  6 - 23 mg/dL    Creatinine, Ser 2.44  0.50 - 1.35 mg/dL   Calcium 9.0  8.4 - 01.0 mg/dL   Total Protein 6.1  6.0 - 8.3 g/dL   Albumin 3.3 (*) 3.5 - 5.2 g/dL   AST 14  0 - 37 U/L   ALT 13  0 - 53 U/L   Alkaline Phosphatase 60  39 - 117 U/L   Total Bilirubin 0.3  0.3 - 1.2 mg/dL   GFR calc non Af Amer >90  >90 mL/min   GFR calc Af Amer >90  >90 mL/min  MAGNESIUM     Status: None   Collection Time    06/06/13  3:45 AM      Result Value Range   Magnesium 1.7  1.5 - 2.5 mg/dL  LIPID PANEL     Status: Abnormal   Collection Time    06/06/13  3:45 AM      Result Value Range   Cholesterol 271 (*) 0 - 200 mg/dL   Triglycerides 272  <536 mg/dL   HDL 39 (*) >64 mg/dL   Total CHOL/HDL Ratio 6.9     VLDL 25  0 - 40 mg/dL   LDL Cholesterol 403 (*) 0 - 99 mg/dL  BASIC METABOLIC PANEL     Status: Abnormal   Collection Time    06/06/13  3:45 AM      Result Value  Range   Sodium 136  135 - 145 mEq/L   Potassium 4.2  3.5 - 5.1 mEq/L   Chloride 105  96 - 112 mEq/L   CO2 24  19 - 32 mEq/L   Glucose, Bld 125 (*) 70 - 99 mg/dL   BUN 10  6 - 23 mg/dL   Creatinine, Ser 9.60  0.50 - 1.35 mg/dL   Calcium 9.0  8.4 - 45.4 mg/dL   GFR calc non Af Amer >90  >90 mL/min   GFR calc Af Amer >90  >90 mL/min  GLUCOSE, CAPILLARY     Status: Abnormal   Collection Time    06/06/13  5:17 AM      Result Value Range   Glucose-Capillary 125 (*) 70 - 99 mg/dL    Intake/Output Summary (Last 24 hours) at 06/06/13 0911 Last data filed at 06/06/13 0600  Gross per 24 hour  Intake  115.8 ml  Output    250 ml  Net -134.2 ml     ASSESSMENT AND PLAN:  NSTEMI (non-ST elevated myocardial infarction):  Cath on Monday.  The patient understands that risks included but are not limited to stroke (1 in 1000), death (1 in 1000), kidney failure [usually temporary] (1 in 500), bleeding (1 in 200), allergic reaction [possibly serious] (1 in 200).  The patient understands and agrees to proceed.   Continue NTG IV and Heparin.   PVD  (peripheral vascular disease) with claudication:  Aortobifem bypass .  Continue risk reduction.   Tobacco abuse: Continue to educate. Continue nicotine patch.   Dyslipidemia:  Lipitor 80 mg.   Type 2 diabetes mellitus:  A1C in process.  Sugars currently OK.   PVD:  He will need follow up with [facility]. He has some resting foot pain.      Rollene Rotunda 06/06/2013 9:11 AM

## 2013-06-06 NOTE — Progress Notes (Signed)
Called into patient's room by patient, who stated he was feeling like he was "having a heart attack".  Complaints were then vague.  He stated "my heart feels sick, what medicine is in the IV?"  I told him he was receiving IV Nitroglycerin and IV Heparin with a mainline IV Saline.  He stated "I can't take Nitroglycerin!  It makes me so sick!  Is that what is making me feel so bad?  Am I allergic to it?"  I proceeded to explain the rational of giving heparin and nitroglycerin IV prior to a cardiac cath, but he was adamant that he wanted the Nitro turned off.  I complied with his wishes, despite several attempts to convince him otherwise.  He said, "I feel better already!"  The IV had been off for 20 seconds.  His primary nurse was notified.

## 2013-06-07 ENCOUNTER — Encounter (HOSPITAL_COMMUNITY)
Admission: EM | Disposition: A | Discharge status: Home / Self Care | Payer: Self-pay | Source: Home / Self Care | Attending: Cardiology

## 2013-06-07 DIAGNOSIS — I2510 Atherosclerotic heart disease of native coronary artery without angina pectoris: Secondary | ICD-10-CM

## 2013-06-07 DIAGNOSIS — I739 Peripheral vascular disease, unspecified: Secondary | ICD-10-CM

## 2013-06-07 DIAGNOSIS — I214 Non-ST elevation (NSTEMI) myocardial infarction: Secondary | ICD-10-CM

## 2013-06-07 HISTORY — PX: PERCUTANEOUS CORONARY STENT INTERVENTION (PCI-S): SHX5485

## 2013-06-07 HISTORY — PX: LEFT HEART CATHETERIZATION WITH CORONARY ANGIOGRAM: SHX5451

## 2013-06-07 LAB — CBC
Hemoglobin: 13.1 g/dL (ref 13.0–17.0)
MCH: 31.4 pg (ref 26.0–34.0)
MCV: 91.4 fL (ref 78.0–100.0)
Platelets: 136 10*3/uL — ABNORMAL LOW (ref 150–400)
RBC: 4.17 MIL/uL — ABNORMAL LOW (ref 4.22–5.81)
WBC: 6 10*3/uL (ref 4.0–10.5)

## 2013-06-07 LAB — GLUCOSE, CAPILLARY
Glucose-Capillary: 133 mg/dL — ABNORMAL HIGH (ref 70–99)
Glucose-Capillary: 131 mg/dL — ABNORMAL HIGH (ref 70–99)

## 2013-06-07 SURGERY — LEFT HEART CATHETERIZATION WITH CORONARY ANGIOGRAM
Anesthesia: LOCAL | Site: Hand | Laterality: Right

## 2013-06-07 MED ORDER — LIDOCAINE HCL (PF) 1 % IJ SOLN
INTRAMUSCULAR | Status: AC
  Filled 2013-06-07: qty 30

## 2013-06-07 MED ORDER — NITROGLYCERIN 0.2 MG/ML ON CALL CATH LAB
INTRAVENOUS | Status: AC
  Filled 2013-06-07: qty 1

## 2013-06-07 MED ORDER — SODIUM CHLORIDE 0.9 % IJ SOLN: 3.0000 mL | Freq: Two times a day (BID) | INTRAMUSCULAR | Status: DC

## 2013-06-07 MED ORDER — BIVALIRUDIN 250 MG IV SOLR
INTRAVENOUS | Status: AC
  Filled 2013-06-07: qty 250

## 2013-06-07 MED ORDER — ASPIRIN 81 MG PO CHEW
CHEWABLE_TABLET | ORAL | Status: AC
  Administered 2013-06-07: 324 mg
  Filled 2013-06-07: qty 4

## 2013-06-07 MED ORDER — SODIUM CHLORIDE 0.9 % IV SOLN
1.0000 mL/kg/h | INTRAVENOUS | Status: AC
  Administered 2013-06-07: 1 mL/kg/h via INTRAVENOUS

## 2013-06-07 MED ORDER — SODIUM CHLORIDE 0.9 % IV SOLN: 250.0000 mL | INTRAVENOUS | Status: DC | PRN

## 2013-06-07 MED ORDER — CLOPIDOGREL BISULFATE 75 MG PO TABS
75.0000 mg | ORAL_TABLET | Freq: Every day | ORAL | Status: DC
  Filled 2013-06-07: qty 1

## 2013-06-07 MED ORDER — MIDAZOLAM HCL 2 MG/2ML IJ SOLN
INTRAMUSCULAR | Status: AC
  Filled 2013-06-07: qty 2

## 2013-06-07 MED ORDER — SODIUM CHLORIDE 0.9 % IV SOLN
1.0000 mL/kg/h | INTRAVENOUS | Status: DC
  Administered 2013-06-07: 1 mL/kg/h via INTRAVENOUS

## 2013-06-07 MED ORDER — FENTANYL CITRATE 0.05 MG/ML IJ SOLN
INTRAMUSCULAR | Status: AC
  Filled 2013-06-07: qty 2

## 2013-06-07 MED ORDER — SODIUM CHLORIDE 0.9 % IJ SOLN: 3.0000 mL | INTRAMUSCULAR | Status: DC | PRN

## 2013-06-07 MED ORDER — HEPARIN (PORCINE) IN NACL 2-0.9 UNIT/ML-% IJ SOLN
INTRAMUSCULAR | Status: AC
  Filled 2013-06-07: qty 1000

## 2013-06-07 NOTE — Care Management Note (Signed)
    Page 1 of 1   06/07/2013     10:57:06 AM   CARE MANAGEMENT NOTE 06/07/2013  Patient:  [name] [name], [name] [name]   Account Number:  [id number]  Date Initiated:  06/07/2013  Documentation initiated by:  Junius Creamer  Subjective/Objective Assessment:   adm w mi     Action/Plan:   lives alone   Anticipated DC Date:     Anticipated DC Plan:        DC Planning Services  CM consult      Choice offered to / List presented to:             Status of service:   Medicare Important Message given?   (If response is "NO", the following Medicare IM given date fields will be blank) Date Medicare IM given:   Date Additional Medicare IM given:    Discharge Disposition:    Per UR Regulation:  Reviewed for med. necessity/level of care/duration of stay  If discussed at Long Length of Stay Meetings, dates discussed:    Comments:

## 2013-06-07 NOTE — CV Procedure (Signed)
   CARDIAC CATH NOTE  Name: [name] [name] MRN: [id number] DOB: 10/07/1951  Procedure: PTCA and stenting of the mid-RCA  Indication: NSTEMI. Diagnostic cath done today shows critical RCA stenosis, CTO of the LCx unchanged from prior cath, and nonobstructive LAD stenosis. Plan for single vessel PCI of the RCA. Pt was loaded with plavix 600 mg on the cath table.   Procedural Details: There was an indwelling 5/6 slim sheath in the right radial artery. Weight-based bivalirudin was given for anticoagulation. Once a therapeutic ACT was achieved, a 6 [place] JR4 guide catheter was inserted.  A BMW coronary guidewire was used to cross the lesion.  The lesion was predilated with a 2.5x15 mm balloon.  The lesion was then stented with a 3.0x32 mm Promus Premier drug-eluting stent.  The stent was postdilated with a 3.5 mm noncompliant balloon.  Following PCI, there was 0% residual stenosis and TIMI-3 flow. Final angiography confirmed an excellent result. The patient tolerated the procedure well. There were no immediate procedural complications. A TR band was used for radial hemostasis. The patient was transferred to the post catheterization recovery area for further monitoring.  Lesion Data: Vessel: RCA/mid Percent stenosis (pre): 99 TIMI-flow (pre):  3 Stent:  3.0x32 mm DES (Promus Premier) Percent stenosis (post): 0 TIMI-flow (post): 3  Conclusions: Successful PCI of the RCA  Recommendations: ASA/Plavix at least 12 months. Tobacco cessation, aggressive risk reduction.  Tonny Bollman 06/07/2013, 12:54 PM

## 2013-06-07 NOTE — Progress Notes (Signed)
    Subjective:  No CP. Has had on and off 'tooth ache.' No shortness of breath.  Objective:  Vital Signs in the last 24 hours: Temp:  [97.2 F (36.2 C)-98.9 F (37.2 C)] 98.9 F (37.2 C) (07/14 0734) Pulse Rate:  [26-57] 50 (07/14 0734) Resp:  [9-25] 16 (07/14 0734) BP: (112-165)/(64-85) 118/67 mmHg (07/14 0734) SpO2:  [93 %-99 %] 97 % (07/14 0734) Weight:  [77.3 kg (170 lb 6.7 oz)] 77.3 kg (170 lb 6.7 oz) (07/14 0348)  Intake/Output from previous day: 07/13 0701 - 07/14 0700 In: 1377.4 [P.O.:840; I.V.:537.4] Out: 3250 [Urine:3250]  Physical Exam: Pt is alert and oriented, NAD HEENT: normal Neck: JVP - normal, carotids 2+= without bruits Lungs: CTA bilaterally CV: RRR without murmur or gallop Abd: soft, NT, Positive BS, no hepatomegaly Ext: no C/C/E Skin: warm/dry no rash  Lab Results:  Recent Labs  06/06/13 0345 06/07/13 0515  WBC 6.2  6.2 6.0  HGB 13.5  13.6 13.1  PLT 165  151 136*    Recent Labs  06/05/13 1753 06/06/13 0345  NA 134* 137  136  K 3.8 3.8  4.2  CL 101 105  105  CO2 27 25  24   GLUCOSE 137* 123*  125*  BUN 10 10  10   CREATININE 0.59 0.59  0.55    Recent Labs  06/06/13 0345 06/06/13 1133  TROPONINI 0.33* 0.37*    Cardiac Studies: 2D Echo: Left ventricle: The cavity size was mildly dilated. Wall thickness was normal. Systolic function was moderately reduced. The estimated ejection fraction was in the range of 35% to 40%. Regional wall motion abnormalities: Akinesis of the basal-midinferior myocardium.  ------------------------------------------------------------ Aortic valve: Structurally normal valve. Cusp separation was normal. Doppler: Transvalvular velocity was within the normal range. There was no stenosis. No regurgitation.  ------------------------------------------------------------ Aorta: Aortic root: The aortic root was normal in size. Ascending aorta: The ascending aorta was normal in  size.  ------------------------------------------------------------ Mitral valve: Structurally normal valve. Doppler: Mild regurgitation.  ------------------------------------------------------------ Left atrium: The atrium was normal in size.  ------------------------------------------------------------ Right ventricle: The cavity size was normal. Systolic function was normal.  ------------------------------------------------------------ Pulmonic valve: Poorly visualized. Doppler: No significant regurgitation.  ------------------------------------------------------------ Tricuspid valve: Poorly visualized. Doppler: No significant regurgitation.  ------------------------------------------------------------ Right atrium: The atrium was normal in size.  ------------------------------------------------------------ Pericardium: There was no pericardial effusion.  ------------------------------------------------------------ Systemic veins: Inferior vena cava: The vessel was dilated; the respirophasic diameter changes were blunted (< 50%); findings are consistent with elevated central venous pressure.  Tele: Sinus brady 50 bpm.  Assessment/Plan:  1. NSTEMI: plan cath possible PCI today. Reviewed procedure and plan in detail. Will do from right radial approach. On appropriate Rx with ASA, lipitor, heparin. Bradycardia precludes use of beta-blocker.  2. PAD with hx aortobifem bypass. Radial cath. Follow-up with vascular surgery.  3. Tobacco - ongoing. Cessation advised.  Dispo: pending cath results.  Tonny Bollman, M.D. 06/07/2013, 8:02 AM

## 2013-06-07 NOTE — Progress Notes (Signed)
ANTICOAGULATION CONSULT NOTE - Follow up Consult  Pharmacy Consult for Heparin Indication: chest pain/ACS  Allergies  Allergen Reactions  . Penicillins Swelling  . Zetia (Ezetimibe) Swelling    Aches all over, short of breath    Patient Measurements: Height: 6\' 3"  (190.5 cm) Weight: 170 lb 6.7 oz (77.3 kg) IBW/kg (Calculated) : 84.5 Heparin Dosing Weight:  74 kg  Vital Signs: Temp: 98.9 F (37.2 C) (07/14 0734) Temp src: Oral (07/14 0734) BP: 118/67 mmHg (07/14 0734) Pulse Rate: 50 (07/14 0734)  Labs:  Recent Labs  06/05/13 1623  06/05/13 1753 06/05/13 2152  06/06/13 0345 06/06/13 1133 06/06/13 1728 06/07/13 0515  HGB 14.7  --  15.2  --   --  13.5  13.6  --   --  13.1  HCT 42.6  --  43.8  --   --  39.8  39.8  --   --  38.1*  PLT 167  --  131*  --   --  165  151  --   --  136*  LABPROT  --   --  13.0  --   --   --   --   --   --   INR  --   --  1.00  --   --   --   --   --   --   HEPARINUNFRC  --   --   --   --   < > 0.19* 0.54 0.63 0.60  CREATININE 0.65  --  0.59  --   --  0.59  0.55  --   --   --   TROPONINI  --   < >  --  0.55*  --  0.33* 0.37*  --   --   < > = values in this interval not displayed.  Estimated Creatinine Clearance: 107.4 ml/min (by C-G formula based on Cr of 0.59).   Assessment:  61 YOM admitted with 3 days of CP, likely NSTEMI- started on heparin. Going to cath today. HL therapeutic x3. Hgb nml, Hct and Plts dropped slightly. No bleeding noted.  Goal of Therapy:  Heparin level 0.3-0.7 units/ml Monitor platelets by anticoagulation protocol: Yes   Plan:  1. Continue heparin gtt at 1200 units/hr 2.  F/u results of cath 3. Daily HL and CBC if heparin to continue   Dugan Vanhoesen D. Hazelynn Mckenny, PharmD Clinical Pharmacist Pager: [phone] 06/07/2013 10:08 AM

## 2013-06-07 NOTE — Progress Notes (Signed)
TR BAND REMOVAL  LOCATION:    right radial  DEFLATED PER PROTOCOL:    yes  TIME BAND OFF / DRESSING APPLIED:    1515   SITE UPON ARRIVAL:    Level 0  SITE AFTER BAND REMOVAL:    Level 0  REVERSE ALLEN'S TEST:     positive  CIRCULATION SENSATION AND MOVEMENT:    Within Normal Limits   yes  COMMENTS:   Tolerated procedure well  

## 2013-06-07 NOTE — CV Procedure (Addendum)
  Cardiac Catheterization Procedure Note  Name: DALLAN SCHONBERG MRN: [id number] DOB: May 30, 1951  Procedure: Left Heart Cath, Selective Coronary Angiography, LV angiography  Indication:   NQWMI  Procedural details: The right radial was prepped, draped, and anesthetized with 1% lidocaine. Using modified Seldinger technique, a 5 French sheath was introduced into the right radial artery. Standard Judkins catheters were used for coronary angiography and left ventriculography. Catheter exchanges were performed over a guidewire. There were no immediate procedural complications. The patient was transferred to the post catheterization recovery area for further monitoring.  Procedural Findings:   Hemodynamics:     AO 127/67    LV 138/10   Coronary angiography:   Coronary dominance: Right  Left mainstem:   Short and normal.   Left anterior descending (LAD):   Very large vessel wrapping the apex.  Proximal 30% at first septal perforator.  Mild mid luminal irregularities.  D1 is moderate sized and branching with ostial 30%.    Left circumflex (LCx):  AV groove long 80% before a small OM.  The vessel is then occluded.  OM2 is occluded at the ostium with long mid subtotal stenosis and fills via left to left collaterals.   Right coronary artery (RCA):  Dominant vessel.  This is a long but somewhat narrow vessel.  The mid vessel has focal 95% stenosis in the midst of a long area of diffuse moderate disease.  PDA is small and normal.  I suspect that the distal circ is codominant although the vessel is not filling adequately via collaterals to assess this.   Left ventriculography: Left ventricular systolic function is mildly reduced with mild inferior hyopkinesis, LVEF is estimated at 60%, there is no significant mitral regurgitation   Final Conclusions:   Severe two vessel CAD.  Mildly abnormal LV function.    Recommendations:   PCI of RCA likely the culprit lesion for acute complaints and the  elevated enzymes.  Medical management for chronic OM occlusion.   Rollene Rotunda 06/07/2013, 12:09 PM

## 2013-06-08 ENCOUNTER — Inpatient Hospital Stay (HOSPITAL_COMMUNITY): Payer: Medicaid Other

## 2013-06-08 LAB — BASIC METABOLIC PANEL
BUN: 10 mg/dL (ref 6–23)
CO2: 25 mEq/L (ref 19–32)
Chloride: 106 mEq/L (ref 96–112)
Creatinine, Ser: 0.61 mg/dL (ref 0.50–1.35)
GFR calc Af Amer: >90 mL/min (ref >90)
Glucose, Bld: 100 mg/dL — ABNORMAL HIGH (ref 70–99)
Potassium: 3.9 mEq/L (ref 3.5–5.1)

## 2013-06-08 LAB — CBC
HCT: 39 % (ref 39.0–52.0)
Hemoglobin: 13.3 g/dL (ref 13.0–17.0)
MCV: 91.8 fL (ref 78.0–100.0)
RBC: 4.25 MIL/uL (ref 4.22–5.81)
RDW: 13.7 % (ref 11.5–15.5)
WBC: 5.5 10*3/uL (ref 4.0–10.5)

## 2013-06-08 LAB — GLUCOSE, CAPILLARY
Glucose-Capillary: 110 mg/dL — ABNORMAL HIGH (ref 70–99)
Glucose-Capillary: 120 mg/dL — ABNORMAL HIGH (ref 70–99)

## 2013-06-08 MED ORDER — ATORVASTATIN CALCIUM 80 MG PO TABS: 80.0000 mg | ORAL_TABLET | Freq: Every day | ORAL | Status: DC

## 2013-06-08 MED ORDER — LOSARTAN POTASSIUM 25 MG PO TABS: 25.0000 mg | ORAL_TABLET | Freq: Every day | ORAL | Status: DC

## 2013-06-08 MED ORDER — NITROGLYCERIN 0.4 MG SL SUBL: 0.4000 mg | SUBLINGUAL_TABLET | SUBLINGUAL | Status: DC | PRN

## 2013-06-08 MED ORDER — ASPIRIN 81 MG PO TBEC: 81.0000 mg | DELAYED_RELEASE_TABLET | Freq: Every day | ORAL | Status: DC

## 2013-06-08 MED ORDER — CLOPIDOGREL BISULFATE 300 MG PO TABS
ORAL_TABLET | ORAL | Status: AC
  Administered 2013-06-08: 10:00:00 75 mg via ORAL
  Filled 2013-06-08: qty 2

## 2013-06-08 MED ORDER — LOSARTAN POTASSIUM 25 MG PO TABS
25.0000 mg | ORAL_TABLET | Freq: Every day | ORAL | Status: DC
  Administered 2013-06-08: 12.5 mg via ORAL
  Filled 2013-06-08 (×2): qty 1

## 2013-06-08 MED ORDER — CLOPIDOGREL BISULFATE 75 MG PO TABS: 75.0000 mg | ORAL_TABLET | Freq: Every day | ORAL | Status: DC

## 2013-06-08 MED FILL — Sodium Chloride IV Soln 0.9%: INTRAVENOUS | Qty: 50 | Status: AC

## 2013-06-08 NOTE — Progress Notes (Signed)
CARDIAC REHAB PHASE I   PRE:  Rate/Rhythm: 66 SR  BP:  Supine: 120/72  Sitting:   Standing:    SaO2:   MODE:  Ambulation: 480 ft   POST:  Rate/Rhythm: 73 SR PVC's  BP:  Supine:   Sitting: 144/93  Standing:    SaO2:  0750-0910 Pt tolerated ambulation well without c/o of cp or SOB. VS stable. Pt admits to limited walking due to leg pain. Completed MI and stent education  with pt. He voices understanding. Pt agrees to Outpt. CRP in [place], will send referral. Discussed smoking cessation with pt and gave hi,m tips for quitting and coaching contact number. Pt seems motivated to quitting using nicotine patch.  Melina Copa RN 06/08/2013 9:10 AM

## 2013-06-08 NOTE — Discharge Summary (Signed)
CARDIOLOGY DISCHARGE SUMMARY   Patient ID: ARBOR COHEN MRN: [id number] DOB/AGE: 1951-01-10 62 y.o.  Admit date: 06/05/2013 Discharge date: 06/08/2013  Primary Discharge Diagnosis:     NSTEMI (non-ST elevated myocardial infarction) - status post drug-eluting stent to the RCA  Secondary Discharge Diagnosis:    PVD (peripheral vascular disease) with claudication   Tobacco abuse   Dyslipidemia   Type 2 diabetes mellitus   Seizure disorder  Procedures: Left Heart Cath, Selective Coronary Angiography, LV angiography, PTCA and stenting of the mid-RCA   Hospital Course: TEAGUE GOYNES is a 62 y.o. male with a history of PVD but not CAD. He had intermittent chest pain he came to the hospital. In the emergency room, his symptoms resolved with IV nitroglycerin and heparin. His initial troponin was slightly elevated. He was admitted for further evaluation and treatment.  His cardiac enzymes elevated indicating a non-ST segment elevation MI. He was continued on nitrates and aspirin as well as heparin. His lipid profile was reviewed and he was started on a statin. Smoking cessation was discussed repeatedly and encouraged. He was continued on his home seizure medications. His blood sugars were managed with a combination of sliding scale insulin and his home medications. A hemoglobin A1c was checked and reviewed. No med changes were made and he is encouraged to be compliant with a diabetic diet.    An echocardiogram was reviewed and he had left ventricular dysfunction. He is felt to have an ischemic cardiomyopathy. He was started on ARB but is not on a beta blocker because of resting bradycardia. He had no more chest pain on medical therapy and was taken to the cath lab on 06/07/2013.  Full cardiac catheterization results are below. The culprit lesion was felt to be the RCA and he had a drug-eluting stent inserted. He tolerated the procedure well. He was seen by cardiac rehabilitation and educated  on MI restrictions, lifestyle modifications and cardiac rehabilitation guidelines.  On 06/08/2013, he was seen by Dr. Excell Seltzer. He was ambulating without chest pain or shortness of breath and considered stable for discharge, to follow up as an outpatient.  Prior to discharge, he was complaining of dizziness. He had no neuro deficits and did not have orthostatic BP changes. His BP was elevated when standing. A head CT was ordered and was without acute abnormalities. His symptoms resolved once the nicotine patch was removed. He is stable for discharge on 06/08/2013 without further medication adjustments.  Labs:  Lab Results  Component Value Date   WBC 5.5 06/08/2013   HGB 13.3 06/08/2013   HCT 39.0 06/08/2013   MCV 91.8 06/08/2013   PLT 151 06/08/2013     Recent Labs Lab 06/06/13 0345 06/08/13 0515  NA 137  136 139  K 3.8  4.2 3.9  CL 105  105 106  CO2 25  24 25   BUN 10  10 10   CREATININE 0.59  0.55 0.61  CALCIUM 9.0  9.0 9.1  PROT 6.1  --   BILITOT 0.3  --   ALKPHOS 60  --   ALT 13  --   AST 14  --   GLUCOSE 123*  125* 100*    Recent Labs  06/05/13 2152 06/06/13 0345 06/06/13 1133  TROPONINI 0.55* 0.33* 0.37*   Lipid Panel     Component Value Date/Time   CHOL 271* 06/06/2013 0345   TRIG 125 06/06/2013 0345   HDL 39* 06/06/2013 0345   CHOLHDL 6.9 06/06/2013 0345  VLDL 25 06/06/2013 0345   LDLCALC 207* 06/06/2013 0345    Pro B Natriuretic peptide (BNP)  Date/Time Value Range Status  06/05/2013  9:52 PM 367.5* 0 - 125 pg/mL Final    Recent Labs  06/05/13 1753  INR 1.00   Lab Results  Component Value Date   HGBA1C 7.1* 06/06/2013      Radiology: Dg Chest 2 View 06/05/2013   *RADIOLOGY REPORT*  Clinical Data: Chest pain  CHEST - 2 VIEW  Comparison: 05/29/2012  Findings: The lungs are clear.  Negative for pneumonia.  Lung volume is normal.  Heart size and vascularity normal.  No mass lesion is identified.  IMPRESSION: Negative   Original Report Authenticated By:  Janeece Riggers, M.D.   Ct Head Wo Contrast 06/08/2013   *RADIOLOGY REPORT*  Clinical Data: Syncopal episode after stent placement.  CT HEAD WITHOUT CONTRAST  Technique:  Contiguous axial images were obtained from the base of the skull through the vertex without contrast.  Comparison: 05/25/2012.  Findings: No acute stroke or hemorrhage.  No mass lesion or hydrocephalus.  No extra-axial fluid.  Mild atrophy with slight small vessel disease.  Suprasellar fullness consistent with known macroadenoma.  Vascular calcification.  Calvarium intact.  Negative orbits, sinuses, and mastoids. Similar appearance to most recent priors.  IMPRESSION: No acute intracranial findings.  See discussion above.   Original Report Authenticated By: Davonna Belling, M.D.    Cardiac Cath: 06/07/2013 Left mainstem: Short and normal.  Left anterior descending (LAD): Very large vessel wrapping the apex. Proximal 30% at first septal perforator. Mild mid luminal irregularities. D1 is moderate sized and branching with ostial 30%.  Left circumflex (LCx): AV groove long 80% before a small OM. The vessel is then occluded. OM2 is occluded at the ostium with long mid subtotal stenosis and fills via left to left collaterals.  Right coronary artery (RCA): Dominant vessel. This is a long but somewhat narrow vessel. The mid vessel has focal 95% stenosis in the midst of a long area of diffuse moderate disease. PDA is small and normal. I suspect that the distal circ is codominant although the vessel is not filling adequately via collaterals to assess this.  Left ventriculography: Left ventricular systolic function is mildly reduced with mild inferior hyopkinesis, LVEF is estimated at 60%, there is no significant mitral regurgitation  Final Conclusions: Severe two vessel CAD. Mildly abnormal LV function.  Lesion Data:  Vessel: RCA/mid  Percent stenosis (pre): 99  TIMI-flow (pre): 3  Stent: 3.0x32 mm DES (Promus Premier)  Percent stenosis (post): 0    TIMI-flow (post): 3  Conclusions: Successful PCI of the RCA  Recommendations: ASA/Plavix at least 12 months. Tobacco cessation, aggressive risk reduction. EKG:  Sinus bradycardia, rate 48, septal infarct  Echo:  06/06/2013  Conclusions - Left ventricle: The cavity size was mildly dilated. Wall thickness was normal. Systolic function was moderately reduced. The estimated ejection fraction was in the range of 35% to 40%. Akinesis of the basal-midinferior myocardium. - Mitral valve: Mild regurgitation.   FOLLOW UP PLANS AND APPOINTMENTS Allergies  Allergen Reactions  . Penicillins Swelling  . Zetia (Ezetimibe) Swelling    Aches all over, short of breath     Medication List         aspirin 81 MG EC tablet  Take 1 tablet (81 mg total) by mouth daily.     atorvastatin 80 MG tablet  Commonly known as:  LIPITOR  Take 1 tablet (80 mg total) by mouth daily.  clopidogrel 75 MG tablet  Commonly known as:  PLAVIX  Take 1 tablet (75 mg total) by mouth daily.     glipiZIDE 10 MG tablet  Commonly known as:  GLUCOTROL  Take 10 mg by mouth 2 (two) times daily before a meal.     losartan 25 MG tablet  Commonly known as:  COZAAR  Take 1 tablet (25 mg total) by mouth daily.     nitroGLYCERIN 0.4 MG SL tablet  Commonly known as:  NITROSTAT  Place 1 tablet (0.4 mg total) under the tongue every 5 (five) minutes as needed for chest pain.     oxyCODONE-acetaminophen 5-325 MG per tablet  Commonly known as:  PERCOCET  Take 2 tablets by mouth every 6 (six) hours as needed for pain.     phenytoin 100 MG ER capsule  Commonly known as:  DILANTIN  Take 200-300 mg by mouth See admin instructions. Take 200 mg in the morning & 300 mg in the evening.     zolpidem 5 MG tablet  Commonly known as:  AMBIEN  Take 5 mg by mouth at bedtime as needed. For sleep        Discharge Orders   Future Appointments Provider Department Dept Phone   06/23/2013 11:50 AM Beatrice Lecher, PA-C [place]  [facility] Main [facility] [place]) [phone]   06/29/2013 3:00 PM [facility] [phone]   06/29/2013 3:45 PM Larina Earthly, MD [facility] [phone]   Future Orders Complete By Expires     Amb Referral to Cardiac Rehabilitation  As directed     Diet - low sodium heart healthy  As directed     Diet Carb Modified  As directed     Increase activity slowly  As directed       Follow-up Information   Follow up with Tereso Newcomer, PA-C On 06/23/2013. (at 11:50 am)    Contact information:   [address] [place] [address] [phone]       BRING ALL MEDICATIONS WITH YOU TO FOLLOW UP APPOINTMENTS  Time spent with patient to include physician time: 38 min Signed: Theodore Demark, PA-C 06/08/2013, 2:48 PM Co-Sign MD

## 2013-06-08 NOTE — Progress Notes (Addendum)
    Subjective:  Feels well this morning. No chest pain or dyspnea.  Objective:  Vital Signs in the last 24 hours: Temp:  [97.4 F (36.3 C)-98.1 F (36.7 C)] 97.8 F (36.6 C) (07/15 0831) Pulse Rate:  [42-60] 60 (07/15 0831) Resp:  [16-20] 20 (07/15 0831) BP: (94-144)/(67-99) 144/93 mmHg (07/15 0831) SpO2:  [96 %-100 %] 96 % (07/15 0831) Weight:  [76.9 kg (169 lb 8.5 oz)] 76.9 kg (169 lb 8.5 oz) (07/15 0031)  Intake/Output from previous day: 07/14 0701 - 07/15 0700 In: 1063.2 [P.O.:480; I.V.:583.2] Out: 1650 [Urine:1650]  Physical Exam: Pt is alert and oriented, NAD HEENT: normal Neck: JVP - normal, carotids 2+= without bruits Lungs: CTA bilaterally CV: RRR without murmur or gallop Abd: soft, NT, Positive BS, no hepatomegaly Ext: no C/C/E, right radial site clear Skin: warm/dry no rash   Lab Results:  Recent Labs  06/07/13 0515 06/08/13 0515  WBC 6.0 5.5  HGB 13.1 13.3  PLT 136* 151    Recent Labs  06/06/13 0345 06/08/13 0515  NA 137  136 139  K 3.8  4.2 3.9  CL 105  105 106  CO2 25  24 25   GLUCOSE 123*  125* 100*  BUN 10  10 10   CREATININE 0.59  0.55 0.61    Recent Labs  06/06/13 0345 06/06/13 1133  TROPONINI 0.33* 0.37*   Tele: Sinus rhythm/sinus bradycardia, personally reviewed  Assessment/Plan:  1. Non-ST elevation MI. Patient is status post PCI to right coronary artery with a drug-eluting stent. He should remain on aspirin and Plavix for at least one year. The importance of medication compliance with dual antiplatelet therapy was reinforced with the patient. He understands. Beta blockers are contraindicated because of fairly marked bradycardia. The patient does have LV dysfunction and will be started on an ARB.  2. Hyperlipidemia. His LDL cholesterol is 207. He will be discharged on atorvastatin 80 mg daily.  3. Tobacco. Extensive tobacco cessation counseling has been done during his hospitalization. He understands the importance of  quitting completely.  4. Peripheral arterial disease status post aortobifemoral bypass. He is followed by Dr. Arbie Cookey.  5. Dispo: home today, meds reviewed and are appropriate post-ACS. Follow-up with APP within 1 week if possible. Since we are starting losartan, should have BMET within 2 weeks.  Tonny Bollman, M.D. 06/08/2013, 9:53 AM

## 2013-06-08 NOTE — Progress Notes (Signed)
Went to Mr. Hottel is room to discuss discharge plans with him. He stated he had recently experienced a sudden onset of dizziness. He stated the room was not spinning but he felt very lightheaded.   His heart rate was in the 50s and his blood pressure was 128/78 lying, 150/86 sitting and 148/100 standing. His heart rate did not change significantly and his symptoms did not significantly change with position changes. He had no unilateral weakness. He had no numbness. He had no visual disturbances. There were no changes in speech. No cranial nerve deficits were noted.  The only recent medications were a nicotine patch at 21 mg and his home dose of Dilantin 200 mg. Cozaar had just come up from the pharmacy but he had not received it yet.  Plan: Will DC the nicotine patch. The patient is reluctant to take the full dose of Cozaar as this is a new medication but he is aware his blood pressure is elevated so is agreeing to take one half tab. We'll check an ECG and a head CT. If these are okay, ambulate patient and possible discharge later today.  Addendum: The patient felt better after The nicotine patch was removed. The ECG had no new changes.

## 2013-06-09 ENCOUNTER — Other Ambulatory Visit: Payer: Self-pay | Admitting: *Deleted

## 2013-06-09 DIAGNOSIS — I739 Peripheral vascular disease, unspecified: Secondary | ICD-10-CM

## 2013-06-09 DIAGNOSIS — Z48812 Encounter for surgical aftercare following surgery on the circulatory system: Secondary | ICD-10-CM

## 2013-06-10 ENCOUNTER — Emergency Department (HOSPITAL_COMMUNITY)
Admission: EM | Admit: 2013-06-10 | Discharge: 2013-06-10 | Disposition: A | Discharge status: Home / Self Care | Payer: Medicaid Other | Attending: Emergency Medicine | Admitting: Emergency Medicine

## 2013-06-10 ENCOUNTER — Encounter (HOSPITAL_COMMUNITY): Payer: Self-pay | Admitting: *Deleted

## 2013-06-10 ENCOUNTER — Telehealth: Payer: Self-pay | Admitting: Physician Assistant

## 2013-06-10 ENCOUNTER — Emergency Department (HOSPITAL_COMMUNITY): Payer: Medicaid Other

## 2013-06-10 DIAGNOSIS — Z862 Personal history of diseases of the blood and blood-forming organs and certain disorders involving the immune mechanism: Secondary | ICD-10-CM | POA: Insufficient documentation

## 2013-06-10 DIAGNOSIS — Z8739 Personal history of other diseases of the musculoskeletal system and connective tissue: Secondary | ICD-10-CM | POA: Insufficient documentation

## 2013-06-10 DIAGNOSIS — Z8639 Personal history of other endocrine, nutritional and metabolic disease: Secondary | ICD-10-CM | POA: Insufficient documentation

## 2013-06-10 DIAGNOSIS — N189 Chronic kidney disease, unspecified: Secondary | ICD-10-CM | POA: Insufficient documentation

## 2013-06-10 DIAGNOSIS — R0600 Dyspnea, unspecified: Secondary | ICD-10-CM

## 2013-06-10 DIAGNOSIS — I2510 Atherosclerotic heart disease of native coronary artery without angina pectoris: Secondary | ICD-10-CM | POA: Insufficient documentation

## 2013-06-10 DIAGNOSIS — E1129 Type 2 diabetes mellitus with other diabetic kidney complication: Secondary | ICD-10-CM | POA: Insufficient documentation

## 2013-06-10 DIAGNOSIS — Z8679 Personal history of other diseases of the circulatory system: Secondary | ICD-10-CM | POA: Insufficient documentation

## 2013-06-10 DIAGNOSIS — R0602 Shortness of breath: Secondary | ICD-10-CM

## 2013-06-10 DIAGNOSIS — F17200 Nicotine dependence, unspecified, uncomplicated: Secondary | ICD-10-CM | POA: Insufficient documentation

## 2013-06-10 DIAGNOSIS — Z8709 Personal history of other diseases of the respiratory system: Secondary | ICD-10-CM | POA: Insufficient documentation

## 2013-06-10 DIAGNOSIS — M549 Dorsalgia, unspecified: Secondary | ICD-10-CM | POA: Insufficient documentation

## 2013-06-10 DIAGNOSIS — H539 Unspecified visual disturbance: Secondary | ICD-10-CM | POA: Insufficient documentation

## 2013-06-10 DIAGNOSIS — I129 Hypertensive chronic kidney disease with stage 1 through stage 4 chronic kidney disease, or unspecified chronic kidney disease: Secondary | ICD-10-CM | POA: Insufficient documentation

## 2013-06-10 DIAGNOSIS — E785 Hyperlipidemia, unspecified: Secondary | ICD-10-CM | POA: Insufficient documentation

## 2013-06-10 DIAGNOSIS — R0609 Other forms of dyspnea: Secondary | ICD-10-CM | POA: Insufficient documentation

## 2013-06-10 DIAGNOSIS — Z8669 Personal history of other diseases of the nervous system and sense organs: Secondary | ICD-10-CM | POA: Insufficient documentation

## 2013-06-10 DIAGNOSIS — I252 Old myocardial infarction: Secondary | ICD-10-CM | POA: Insufficient documentation

## 2013-06-10 DIAGNOSIS — R0989 Other specified symptoms and signs involving the circulatory and respiratory systems: Secondary | ICD-10-CM | POA: Insufficient documentation

## 2013-06-10 DIAGNOSIS — Z7982 Long term (current) use of aspirin: Secondary | ICD-10-CM | POA: Insufficient documentation

## 2013-06-10 DIAGNOSIS — Z79899 Other long term (current) drug therapy: Secondary | ICD-10-CM | POA: Insufficient documentation

## 2013-06-10 LAB — CBC WITH DIFFERENTIAL/PLATELET
Eosinophils Absolute: 0.2 10*3/uL (ref 0.0–0.7)
Hemoglobin: 13.3 g/dL (ref 13.0–17.0)
Lymphs Abs: 1.4 10*3/uL (ref 0.7–4.0)
MCH: 30.9 pg (ref 26.0–34.0)
Monocytes Relative: 9 % (ref 3–12)
Neutro Abs: 2.7 10*3/uL (ref 1.7–7.7)
Neutrophils Relative %: 56 % (ref 43–77)
Platelets: 127 10*3/uL — ABNORMAL LOW (ref 150–400)
RBC: 4.31 MIL/uL (ref 4.22–5.81)
WBC: 4.8 10*3/uL (ref 4.0–10.5)

## 2013-06-10 LAB — COMPREHENSIVE METABOLIC PANEL
ALT: 16 U/L (ref 0–53)
Albumin: 3.5 g/dL (ref 3.5–5.2)
Alkaline Phosphatase: 68 U/L (ref 39–117)
BUN: 10 mg/dL (ref 6–23)
Chloride: 104 mEq/L (ref 96–112)
GFR calc Af Amer: >90 mL/min (ref >90)
Glucose, Bld: 169 mg/dL — ABNORMAL HIGH (ref 70–99)
Potassium: 4.2 mEq/L (ref 3.5–5.1)
Sodium: 137 mEq/L (ref 135–145)
Total Bilirubin: 0.2 mg/dL — ABNORMAL LOW (ref 0.3–1.2)
Total Protein: 6.6 g/dL (ref 6.0–8.3)

## 2013-06-10 LAB — POCT I-STAT TROPONIN I: Troponin i, poc: 0.2 ng/mL (ref 0.00–0.08)

## 2013-06-10 LAB — URINALYSIS, ROUTINE W REFLEX MICROSCOPIC
Glucose, UA: NEGATIVE mg/dL
Hgb urine dipstick: NEGATIVE
Leukocytes, UA: NEGATIVE
pH: 7 (ref 5.0–8.0)

## 2013-06-10 MED ORDER — HYDROMORPHONE HCL PF 1 MG/ML IJ SOLN
1.0000 mg | Freq: Once | INTRAMUSCULAR | Status: AC
  Administered 2013-06-10: 1 mg via INTRAVENOUS
  Filled 2013-06-10: qty 1

## 2013-06-10 NOTE — ED Notes (Signed)
Pt discharged.Vital signs stable and GCS 15 

## 2013-06-10 NOTE — ED Notes (Signed)
Pt very anxious.Pt not happy about discharge.He said he was not happy with the plan and he was worried about his heart.Cardiologist explained to the pt and clarified his doubts and question but pt was not happy.Pt discharged.Vital signs stable and GCS 15.Pt asymptomatic with bradycardia.

## 2013-06-10 NOTE — ED Notes (Signed)
Pt does not have SOB but complains of neck pain.

## 2013-06-10 NOTE — ED Provider Notes (Signed)
9:52 AM  Date: 06/10/2013  Rate: 52  Rhythm: sinus bradycardia  QRS Axis: normal  Intervals: normal QRS:  Poor R wave progression in precordial leads suggests old anterior myocardial infarction.  ST/T Wave abnormalities: normal  Conduction Disutrbances:none  Narrative Interpretation: Abnormal EKG  Old EKG Reviewed: unchanged    Carleene Cooper III, MD 06/10/13 [phone]

## 2013-06-10 NOTE — ED Provider Notes (Signed)
I saw and evaluated the patient, reviewed the resident's note and I agree with the findings and plan. 62 yo man had had stent placed for NSTEMI recently, today complains of shortness of breath, easy bruising.  Exam essentially negative except for minimal bruising on left upper arm.  TNI somewhat elevated.  Seen by [facility], who released him.   Carleene Cooper III, MD 06/10/13 [phone]

## 2013-06-10 NOTE — H&P (Signed)
History and Physical   Patient ID: [name] [name] MRN: [id number], DOB/AGE: June 20, 1951 62 y.o. Date of Encounter: 06/10/2013  Primary Physician: Mia Creek, MD Primary Cardiologist: Tonny Bollman  Chief Complaint:  SOB, diaphoresis.  HPI:  [name] [name] is a 62 y.o. y/o male w/ PMHx of CAD (s/p DES of RCA on 06/08/13), PVD (s/p bilateral fem-pop bypass on 11/25/11), DM, HTN, HLD, seizures, and chronic pain on narcotics, presents to the ED w/ complaints of SOB and diaphoresis. He claims to have woken up in the middle of the night with excessive nights sweats and said he sweated through his sheets and his pillow. This morning he claims to have had an episode of SOB at rest, but admits that it might be more associated with anxiety. He says he was reading the adverse affects page of his new Plavix prescription and started to worry about it being associated with his recent night sweats. He denies any associated chest pain, dizziness, lightheadedness, LOC, fever, nausea, or vomiting.   Past Medical History  Diagnosis Date  . Diabetes mellitus   . Hyperlipidemia   . Hypertension   . Arthritis   . Leg pain   . Seizure disorder   . Myocardial infarction     "silent heart attack by patient"  . Peripheral vascular disease   . Bronchitis   . 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 Seizures     last seizure "years ago"  . Coronary artery disease   . Chronic kidney disease     hx UTI, and kidney stones    Surgical History:  Past Surgical History  Procedure Laterality Date  . Thrombectomy      left to right fem-fem bypass  . Hernia repair    . Aortogram with iliac artery stenting  05/06/2011  . Tonsillectomy    . Aorta - bilateral femoral artery bypass graft  11/25/2011    Procedure: AORTA BIFEMORAL BYPASS GRAFT;  Surgeon: Larina Earthly, MD;  Location: [facility] OR;  Service: Vascular;  Laterality: N/A;  . Pr vein bypass  graft,aorto-fem-pop  11/24/12     I have reviewed the patient's current medications. Prior to Admission medications   Medication Sig Start Date End Date Taking? Authorizing Provider  aspirin 81 MG EC tablet Take 1 tablet (81 mg total) by mouth daily. 06/08/13  Yes Rhonda G Barrett, PA-C  atorvastatin (LIPITOR) 80 MG tablet Take 1 tablet (80 mg total) by mouth daily. 06/08/13  Yes Rhonda G Barrett, PA-C  clopidogrel (PLAVIX) 75 MG tablet Take 1 tablet (75 mg total) by mouth daily. 06/08/13  Yes Rhonda G Barrett, PA-C  glipiZIDE (GLUCOTROL) 10 MG tablet Take 10 mg by mouth 2 (two) times daily before a meal.     Yes Historical Provider, MD  losartan (COZAAR) 25 MG tablet Take 1 tablet (25 mg total) by mouth daily. 06/08/13  Yes Rhonda G Barrett, PA-C  nitroGLYCERIN (NITROSTAT) 0.4 MG SL tablet Place 1 tablet (0.4 mg total) under the tongue every 5 (five) minutes as needed for chest pain. 06/08/13  Yes Rhonda G Barrett, PA-C  oxyCODONE (OXYCONTIN) 10 MG 12 hr tablet Take 10 mg by mouth 3 (three) times daily as needed for pain.   Yes Historical Provider, MD  phenytoin (DILANTIN) 100 MG ER capsule Take 200-300 mg by mouth 2 (two) times daily. Take 200 mg in the morning & 300 mg in the  evening.   Yes Historical Provider, MD  zolpidem (AMBIEN) 5 MG tablet Take 5 mg by mouth at bedtime as needed. For sleep   Yes Historical Provider, MD   Scheduled Meds: Continuous Infusions: PRN Meds:.   Allergies:  Allergies  Allergen Reactions  . Penicillins Other (See Comments)    Unknown  . Zetia (Ezetimibe) Swelling    History   Social History  . Marital Status: Single    Spouse Name: N/A    Number of Children: N/A  . Years of Education: N/A   Occupational History  . Not on file.   Social History Main Topics  . 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 Smokeless tobacco: Not on file  . Alcohol Use: No     Comment: alcohol abuse in the past  .  Drug Use: 1.00 per week    Special: Marijuana     Comment: stopped May 07, 2012  . Sexually Active: Not on file   Other Topics Concern  . Not on file   Social History Narrative  . No narrative on file    Family History  Problem Relation Age of Onset  . Hypertension Mother   . Heart attack Mother   . Diabetes Mother   . Aneurysm Father   . Heart attack Sister   . Heart disease Brother     heart transplant   Family Status  Relation Status Death Age  . Mother Deceased 43  . Father Deceased 2  . Son Alive   . Son Alive     Review of Systems: General: Complains of excessive night sweats in the last 2 days. Denies fever, chills, appetite change and fatigue.  HEENT: Claims he had some blurring of vision yesterday afternoon. Only lasted for a couple minutes. Denies eye pain, redness, hearing loss, congestion, sore throat, rhinorrhea, sneezing, mouth sores, trouble swallowing, neck pain, neck stiffness and tinnitus.   Respiratory: Claims to have had some SOB this morning at rest, but subsided. Denies DOE, cough, chest tightness, and wheezing.   Cardiovascular: Denies chest pain, palpitations and leg swelling.  Gastrointestinal: Denies nausea, vomiting, abdominal pain, diarrhea, constipation, blood in stool and abdominal distention.  Genitourinary: Denies dysuria, urgency, frequency, hematuria, flank pain and difficulty urinating.  Endocrine: Denies hot or cold intolerance, sweats, polyuria, polydipsia. Musculoskeletal: Denies myalgias, back pain, joint swelling, arthralgias and gait problem.  Skin: Denies pallor, rash and wounds.  Neurological: Denies dizziness, seizures, syncope, weakness, lightheadedness, numbness and headaches.  Hematological: Complains of bruising on his left upper arm. Denies adenopathy, personal or family bleeding history.  Psychiatric/Behavioral: Denies mood changes, confusion, nervousness, sleep disturbance and agitation.  Physical Exam: Blood pressure  128/81, pulse 44, temperature 98 F (36.7 C), temperature source Oral, resp. rate 18, SpO2 98.00%. General: Vital signs reviewed.  Patient is a well-developed and well-nourished, in no acute distress and cooperative with exam. Alert and oriented x3.  Head: Normocephalic and atraumatic. Nose: No erythema or drainage noted.  Turbinates normal. Mouth: No erythema, exudates, sores, or ulcerations. Moist mucus membranes. Eyes: PERRL, EOMI, conjunctivae normal, No scleral icterus.  Neck: Supple, trachea midline, normal ROM, No JVD, masses, thyromegaly, or carotid bruit present.  Cardiovascular: Bradycardic on exam w/ some periods in the high 30's. S1 normal, S2 normal, no murmurs, gallops, or rubs. Pulmonary/Chest: normal respiratory effort, CTAB, no wheezes, rales, or rhonchi. Abdominal: Soft. Non-tender, non-distended, bowel sounds are normal, no masses, organomegaly, or  guarding present.  Musculoskeletal: No joint deformities, erythema, or stiffness, ROM full and no nontender. Extremities: No swelling or edema,  pulses symmetric and intact bilaterally. No cyanosis or clubbing.  Neurological: A&O x3, Strength is normal and symmetric bilaterally, cranial nerve II-XII are grossly intact, no focal motor deficit, sensory intact to light touch bilaterally.  Skin: Warm, dry and intact. Rash present on back and abdomen. Ecchymosis present on left upper arm, most likely due to IV placement on last admission. Psychiatric: Normal mood and affect. speech and behavior is normal. Cognition and memory are normal.   Labs:   Lab Results  Component Value Date   WBC 4.8 06/10/2013   HGB 13.3 06/10/2013   HCT 39.6 06/10/2013   MCV 91.9 06/10/2013   PLT 127* 06/10/2013    Recent Labs  06/10/13 0949  INR 1.04    Recent Labs Lab 06/10/13 0949  NA 137  K 4.2  CL 104  CO2 25  BUN 10  CREATININE 0.63  CALCIUM 9.6  PROT 6.6  BILITOT 0.2*  ALKPHOS 68  ALT 16  AST 17  GLUCOSE 169*   No results found for  this basename: CKTOTAL, CKMB, TROPONINI,  in the last 72 hours No results found for this basename: TROPIPOC,  in the last 72 hours Lab Results  Component Value Date   CHOL 271* 06/06/2013   HDL 39* 06/06/2013   LDLCALC 207* 06/06/2013   TRIG 125 06/06/2013   Pro B Natriuretic peptide (BNP)  Date/Time Value Range Status  06/05/2013  9:52 PM 367.5* 0 - 125 pg/mL Final   Lab Results  Component Value Date   DDIMER 2.97* 12/06/2011   Radiology/Studies: Dg Chest 2 View  06/10/2013   *RADIOLOGY REPORT*  Clinical Data: Shortness of breath  CHEST - 2 VIEW  Comparison: 06/05/2013  Findings: Cardiomediastinal silhouette is stable.  No acute infiltrate or pleural effusion.  No pulmonary edema.  Bony thorax is unremarkable.  IMPRESSION: No active disease.  No significant change.   Original Report Authenticated By: Natasha Mead, M.D.   Ct Head Wo Contrast  06/08/2013   *RADIOLOGY REPORT*  Clinical Data: Syncopal episode after stent placement.  CT HEAD WITHOUT CONTRAST  Technique:  Contiguous axial images were obtained from the base of the skull through the vertex without contrast.  Comparison: 05/25/2012.  Findings: No acute stroke or hemorrhage.  No mass lesion or hydrocephalus.  No extra-axial fluid.  Mild atrophy with slight small vessel disease.  Suprasellar fullness consistent with known macroadenoma.  Vascular calcification.  Calvarium intact.  Negative orbits, sinuses, and mastoids. Similar appearance to most recent priors.  IMPRESSION: No acute intracranial findings.  See discussion above.   Original Report Authenticated By: Davonna Belling, M.D.     Cardiac Cath: 06/07/13 Procedure: PTCA and stenting of the mid-RCA  Indication: NSTEMI. Diagnostic cath done today shows critical RCA stenosis, CTO of the LCx unchanged from prior cath, and nonobstructive LAD stenosis. Plan for single vessel PCI of the RCA. Pt was loaded with plavix 600 mg on the cath table.  Procedural Details: There was an indwelling 5/6 slim  sheath in the right radial artery. Weight-based bivalirudin was given for anticoagulation. Once a therapeutic ACT was achieved, a 6 [place] JR4 guide catheter was inserted. A BMW coronary guidewire was used to cross the lesion. The lesion was predilated with a 2.5x15 mm balloon. The lesion was then stented with a 3.0x32 mm Promus Premier drug-eluting stent. The stent was postdilated with a 3.5 mm  noncompliant balloon. Following PCI, there was 0% residual stenosis and TIMI-3 flow. Final angiography confirmed an excellent result. The patient tolerated the procedure well. There were no immediate procedural complications. A TR band was used for radial hemostasis. The patient was transferred to the post catheterization recovery area for further monitoring.  Lesion Data:  Vessel: RCA/mid  Percent stenosis (pre): 99  TIMI-flow (pre): 3  Stent: 3.0x32 mm DES (Promus Premier)  Percent stenosis (post): 0  TIMI-flow (post): 3  Conclusions: Successful PCI of the RCA  Recommendations: ASA/Plavix at least 12 months. Tobacco cessation, aggressive risk reduction.  Echo: 06/06/13 Study Conclusions  - Left ventricle: The cavity size was mildly dilated. Wall thickness was normal. Systolic function was moderately reduced. The estimated ejection fraction was in the range of 35% to 40%. Akinesis of the basal-midinferior myocardium. - Mitral valve: Mild regurgitation.  ECG: NSR @ 52 BPM  ASSESSMENT AND PLAN:  Dyspnea:  Seems functional. Normal exam Not dyspnic with good sats CXR normal with no CHF.  Appears to be anxious about living alone.  MR murmur not audible and only mild on recent echo Bruising.  From recent procedure PLT;s a bit low but not significant.  Doubt reaction to plavix Would continue with new DES to coronary Diaphoresis:  Likely related to anxiety.  No signs of acute ischemia WBC normal and no signs of infection.   Cholesterol:  Continue statin.    If other labs ok can be discharged from ER with  outpatient f/u Signed, Lars Masson, MD 06/10/2013 1:16 PM

## 2013-06-10 NOTE — ED Provider Notes (Signed)
History    CSN: [id number] Arrival date & time 06/10/13  0903  First MD Initiated Contact with Patient 06/10/13 [id number]     Chief Complaint  Patient presents with  . Shortness of Breath    HPI  Pt is a 62 yo AA male with T2DM, HL, HTN, PVD, seizure disorder, chronic pain using narcotics, and recent hospitalization with NSTEMI 7/12-7/15 requiring DES of RCA and started on plavix and lipitor, who presents with dyspnea, diaphoresis, and describes purple patches on skin that began less than 1 day ago.  Pt reports after his cardiac stent placement he was feeling like a million bucks until he began having sweating that began last night causing him to drench his pillow in sweat. He also noticed purple bruises on his left arm that he is not sure if are due to prior IV placement. This morning he called his cardiologist's office and at the time was also short of breath advising him to come into the ED for further evaluation. He reports no fevers, palpitations, CP, pain down left arm or jaw, dizziness, cough, nausea, vomiting, or bleeding. No hematuria or blood in stool. He reports having chronic pain from past aortic bilateral femoral artery bypass graft and takes 30mg  of oxycontin a day for relief of pain.       Past Medical History  Diagnosis Date  . Diabetes mellitus   . Hyperlipidemia   . Hypertension   . Arthritis   . Leg pain   . Seizure disorder   . Myocardial infarction     "silent heart attack by patient"  . Peripheral vascular disease   . Bronchitis   . 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 Seizures     last seizure "years ago"  . Coronary artery disease   . Chronic kidney disease     hx UTI, and kidney stones   Past Surgical History  Procedure Laterality Date  . Thrombectomy      left to right fem-fem bypass  . Hernia repair    . Aortogram with iliac artery stenting  05/06/2011  . Tonsillectomy    . Aorta - bilateral femoral  artery bypass graft  11/25/2011    Procedure: AORTA BIFEMORAL BYPASS GRAFT;  Surgeon: Larina Earthly, MD;  Location: [facility] OR;  Service: Vascular;  Laterality: N/A;  . Pr vein bypass graft,aorto-fem-pop  11/24/12   Family History  Problem Relation Age of Onset  . Hypertension Mother   . Heart attack Mother   . Diabetes Mother   . Aneurysm Father   . Heart attack Sister   . Heart disease Brother     heart transplant   History  Substance Use Topics  . 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 Smokeless tobacco: Not on file  . Alcohol Use: No     Comment: alcohol abuse in the past    Review of Systems  Constitutional: Negative for fever and chills.  Eyes: Positive for visual disturbance.  Respiratory: Positive for shortness of breath. Negative for cough.        Tingling in chest   Cardiovascular: Negative for chest pain, palpitations and leg swelling.  Gastrointestinal: Negative for nausea, vomiting, abdominal pain and blood in stool.  Genitourinary: Negative for hematuria.  Musculoskeletal: Positive for back pain (chronic).  Skin:       Bruises on  left arm   Neurological: Negative for dizziness and headaches.    Allergies  Penicillins and Zetia  Home Medications   Current Outpatient Rx  Name  Route  Sig  Dispense  Refill  . aspirin 81 MG EC tablet   Oral   Take 1 tablet (81 mg total) by mouth daily.         Marland Kitchen atorvastatin (LIPITOR) 80 MG tablet   Oral   Take 1 tablet (80 mg total) by mouth daily.   30 tablet   11   . clopidogrel (PLAVIX) 75 MG tablet   Oral   Take 1 tablet (75 mg total) by mouth daily.   30 tablet   11   . glipiZIDE (GLUCOTROL) 10 MG tablet   Oral   Take 10 mg by mouth 2 (two) times daily before a meal.           . losartan (COZAAR) 25 MG tablet   Oral   Take 1 tablet (25 mg total) by mouth daily.   30 tablet   11   . nitroGLYCERIN (NITROSTAT) 0.4 MG SL tablet   Sublingual    Place 1 tablet (0.4 mg total) under the tongue every 5 (five) minutes as needed for chest pain.   25 tablet   3   . oxyCODONE (OXYCONTIN) 10 MG 12 hr tablet   Oral   Take 10 mg by mouth 3 (three) times daily as needed for pain.         . phenytoin (DILANTIN) 100 MG ER capsule   Oral   Take 200-300 mg by mouth 2 (two) times daily. Take 200 mg in the morning & 300 mg in the evening.         . zolpidem (AMBIEN) 5 MG tablet   Oral   Take 5 mg by mouth at bedtime as needed. For sleep          BP 134/83  Pulse 64  Temp(Src) 98 F (36.7 C) (Oral)  Resp 20  SpO2 100% Physical Exam  Constitutional: He is oriented to person, place, and time. He appears well-developed and well-nourished. No distress.  HENT:  Head: Normocephalic and atraumatic.  Mouth/Throat: Oropharynx is clear and moist.  Eyes: EOM are normal.  Neck: Normal range of motion. Neck supple.  Cardiovascular: Normal rate, regular rhythm and normal heart sounds.   Pulmonary/Chest: Effort normal and breath sounds normal. No respiratory distress. He has no wheezes. He has no rales. He exhibits no tenderness.  Abdominal: Soft. Bowel sounds are normal. He exhibits no distension.  Musculoskeletal: Normal range of motion. He exhibits no edema.  Neurological: He is alert and oriented to person, place, and time.  Skin: Skin is warm and dry. He is not diaphoretic.  3 small areas of ecchymosis on left midarm   Psychiatric: He has a normal mood and affect.    ED Course  Procedures (including critical care time) Labs Reviewed  CBC WITH DIFFERENTIAL - Abnormal; Notable for the following:    Platelets 127 (*)    All other components within normal limits  COMPREHENSIVE METABOLIC PANEL - Abnormal; Notable for the following:    Glucose, Bld 169 (*)    Total Bilirubin 0.2 (*)    All other components within normal limits  PROTIME-INR  APTT  URINALYSIS, ROUTINE W REFLEX MICROSCOPIC    No diagnosis found.  MDM  Assessment:  62 yo AA male with T2DM, HL, HTN, PVD, seizure disorder, chronic pain using narcotics, and  recent hospitalization with NSTEMI 7/12-7/15 requiring DES of RCA and started on plavix and lipitor, who presents with dyspnea, diaphoresis, and describes purple patches on skin  that began less than 1 day ago.       Plan:  Respiratory distress - SE of new medication vs Anxiety vs Bronchospasm vs Cardiac thrombosis  -Obtain 12-lead EKG ---> unchanged from prior EKG, bradycardic, normal sinus  -Obtain Troponin levels ---> 0.20 --> down trending from recent stent placement (0.29 on 7/12) -Obtain CXR --->  No evidence of cardiopulmonary disease -Obtain CBC w/diff ---> thrombocytopenia (127K), with recent heparin, on anti-platlet therapy -->  downtrending, 2 days ago 150K -Obtain CMP ---> hyperglycemia  -Obtain PT/PTT ---> WNL -Obtain UA ---> normal  -Administer dilaudid 1mg  injection for pain ---> pain improved -Monitor on cardiac monitor and oxygen saturation with pulse oximetry  -Consult cardiology ---> Ok to discharge, troponin elevated due to recent NSTEMI and stent placement, needs to continue plavix to prevent reoclussion, doubt SE of medication, to follow-up in clinic     Disposition: Home ----> Pt's dyspnea improved. Breathing comfortably on RA with normal oxygen saturation and normal CXR. EKG unchanged (bradycardia) and troponin levels downtrending since 5 days ago, will likely continue to be elevated considering recent NSTEMI and stent placement. Diaphoresis etiology unknown, unlikely due to Plavix  since rare SE.  Ecchymoses on left arm most likely from previous site of IV.  Mild thrombocytopenia most likely due to antiplatelet therapy vs platelet clumping unlikely due to HIT.    Discharge Instructions: -To continue taking home medications, including Plavix to prevent reocclusion  -To follow-up with PCP or cardiology                      Otis Brace, MD 06/10/13 1729

## 2013-06-10 NOTE — ED Notes (Signed)
Results of troponin given to Dr. Davidson 

## 2013-06-10 NOTE — ED Notes (Signed)
SOB starting this am. Pt reports sweating during the night. Denies chest pain. Lungs clear with EMS. resps 18. bp 143/99. 100% on 2L.

## 2013-06-10 NOTE — Telephone Encounter (Signed)
[name] [name] called the answering service this morning initially with question about his Plavix. He said he read that a side effect could be sweating and he woke up drenched in sweat overnight. He also noticed "purple patches" on his arm. He said he wonders if this is related to his IV. I asked him what he meant and he said he may be beginning to feel more short of breath as we are talking on the phone which was not the case before. I told him this, plus the sweating, is concerning to me. I advise he proceed to ER and call 911 if symptoms are progressing. I told him he is absolutely not to drive himself. He verbalized understand and gratitude. [name] Siebenaler PA-C

## 2013-06-11 ENCOUNTER — Encounter (HOSPITAL_COMMUNITY): Payer: Self-pay | Admitting: *Deleted

## 2013-06-11 ENCOUNTER — Emergency Department (HOSPITAL_COMMUNITY)
Admission: EM | Admit: 2013-06-11 | Discharge: 2013-06-11 | Disposition: A | Discharge status: Home / Self Care | Payer: Medicaid Other | Attending: Emergency Medicine | Admitting: Emergency Medicine

## 2013-06-11 ENCOUNTER — Telehealth: Payer: Self-pay | Admitting: Physician Assistant

## 2013-06-11 ENCOUNTER — Emergency Department (HOSPITAL_COMMUNITY): Payer: Medicaid Other

## 2013-06-11 DIAGNOSIS — Z87440 Personal history of urinary (tract) infections: Secondary | ICD-10-CM | POA: Insufficient documentation

## 2013-06-11 DIAGNOSIS — Z87442 Personal history of urinary calculi: Secondary | ICD-10-CM | POA: Insufficient documentation

## 2013-06-11 DIAGNOSIS — R5381 Other malaise: Secondary | ICD-10-CM | POA: Insufficient documentation

## 2013-06-11 DIAGNOSIS — I998 Other disorder of circulatory system: Secondary | ICD-10-CM

## 2013-06-11 DIAGNOSIS — R0609 Other forms of dyspnea: Secondary | ICD-10-CM | POA: Insufficient documentation

## 2013-06-11 DIAGNOSIS — I129 Hypertensive chronic kidney disease with stage 1 through stage 4 chronic kidney disease, or unspecified chronic kidney disease: Secondary | ICD-10-CM | POA: Insufficient documentation

## 2013-06-11 DIAGNOSIS — I214 Non-ST elevation (NSTEMI) myocardial infarction: Secondary | ICD-10-CM

## 2013-06-11 DIAGNOSIS — M129 Arthropathy, unspecified: Secondary | ICD-10-CM | POA: Insufficient documentation

## 2013-06-11 DIAGNOSIS — Z79899 Other long term (current) drug therapy: Secondary | ICD-10-CM | POA: Insufficient documentation

## 2013-06-11 DIAGNOSIS — Z862 Personal history of diseases of the blood and blood-forming organs and certain disorders involving the immune mechanism: Secondary | ICD-10-CM | POA: Insufficient documentation

## 2013-06-11 DIAGNOSIS — I2510 Atherosclerotic heart disease of native coronary artery without angina pectoris: Secondary | ICD-10-CM | POA: Insufficient documentation

## 2013-06-11 DIAGNOSIS — G40909 Epilepsy, unspecified, not intractable, without status epilepticus: Secondary | ICD-10-CM | POA: Insufficient documentation

## 2013-06-11 DIAGNOSIS — R0789 Other chest pain: Secondary | ICD-10-CM | POA: Insufficient documentation

## 2013-06-11 DIAGNOSIS — F17200 Nicotine dependence, unspecified, uncomplicated: Secondary | ICD-10-CM | POA: Insufficient documentation

## 2013-06-11 DIAGNOSIS — R0989 Other specified symptoms and signs involving the circulatory and respiratory systems: Secondary | ICD-10-CM | POA: Insufficient documentation

## 2013-06-11 DIAGNOSIS — K921 Melena: Secondary | ICD-10-CM | POA: Insufficient documentation

## 2013-06-11 DIAGNOSIS — E785 Hyperlipidemia, unspecified: Secondary | ICD-10-CM | POA: Insufficient documentation

## 2013-06-11 DIAGNOSIS — R5383 Other fatigue: Secondary | ICD-10-CM | POA: Insufficient documentation

## 2013-06-11 DIAGNOSIS — I252 Old myocardial infarction: Secondary | ICD-10-CM | POA: Insufficient documentation

## 2013-06-11 DIAGNOSIS — Z8679 Personal history of other diseases of the circulatory system: Secondary | ICD-10-CM | POA: Insufficient documentation

## 2013-06-11 DIAGNOSIS — N189 Chronic kidney disease, unspecified: Secondary | ICD-10-CM | POA: Insufficient documentation

## 2013-06-11 DIAGNOSIS — R197 Diarrhea, unspecified: Secondary | ICD-10-CM | POA: Insufficient documentation

## 2013-06-11 DIAGNOSIS — Z7982 Long term (current) use of aspirin: Secondary | ICD-10-CM | POA: Insufficient documentation

## 2013-06-11 DIAGNOSIS — Z951 Presence of aortocoronary bypass graft: Secondary | ICD-10-CM | POA: Insufficient documentation

## 2013-06-11 DIAGNOSIS — R0600 Dyspnea, unspecified: Secondary | ICD-10-CM

## 2013-06-11 DIAGNOSIS — F411 Generalized anxiety disorder: Secondary | ICD-10-CM | POA: Insufficient documentation

## 2013-06-11 DIAGNOSIS — E119 Type 2 diabetes mellitus without complications: Secondary | ICD-10-CM | POA: Insufficient documentation

## 2013-06-11 DIAGNOSIS — Z8639 Personal history of other endocrine, nutritional and metabolic disease: Secondary | ICD-10-CM | POA: Insufficient documentation

## 2013-06-11 DIAGNOSIS — Z880 Allergy status to penicillin: Secondary | ICD-10-CM | POA: Insufficient documentation

## 2013-06-11 LAB — CBC WITH DIFFERENTIAL/PLATELET
Basophils Absolute: 0 10*3/uL (ref 0.0–0.1)
Eosinophils Relative: 3 % (ref 0–5)
Lymphocytes Relative: 27 % (ref 12–46)
Neutro Abs: 3.4 10*3/uL (ref 1.7–7.7)
Neutrophils Relative %: 60 % (ref 43–77)
Platelets: 147 10*3/uL — ABNORMAL LOW (ref 150–400)
RBC: 4.45 MIL/uL (ref 4.22–5.81)
RDW: 13.4 % (ref 11.5–15.5)
WBC: 5.7 10*3/uL (ref 4.0–10.5)

## 2013-06-11 LAB — PROTIME-INR
INR: 1.04 (ref 0.00–1.49)
Prothrombin Time: 13.4 seconds (ref 11.6–15.2)

## 2013-06-11 LAB — COMPREHENSIVE METABOLIC PANEL
ALT: 19 U/L (ref 0–53)
AST: 20 U/L (ref 0–37)
Alkaline Phosphatase: 75 U/L (ref 39–117)
CO2: 25 mEq/L (ref 19–32)
Calcium: 9.5 mg/dL (ref 8.4–10.5)
Chloride: 101 mEq/L (ref 96–112)
GFR calc non Af Amer: >90 mL/min (ref >90)
Potassium: 3.8 mEq/L (ref 3.5–5.1)
Sodium: 136 mEq/L (ref 135–145)

## 2013-06-11 MED ORDER — MORPHINE SULFATE 4 MG/ML IJ SOLN
4.0000 mg | Freq: Once | INTRAMUSCULAR | Status: AC
  Administered 2013-06-11: 4 mg via INTRAVENOUS
  Filled 2013-06-11: qty 1

## 2013-06-11 MED ORDER — ONDANSETRON HCL 4 MG/2ML IJ SOLN
4.0000 mg | Freq: Once | INTRAMUSCULAR | Status: AC
  Administered 2013-06-11: 4 mg via INTRAVENOUS
  Filled 2013-06-11: qty 2

## 2013-06-11 NOTE — ED Notes (Signed)
Cardiology NP at bedside.

## 2013-06-11 NOTE — ED Provider Notes (Signed)
History    CSN: [id number] Arrival date & time 06/11/13  1324  First MD Initiated Contact with Patient 06/11/13 [phone]     Chief Complaint  Patient presents with  . Allergic Reaction  . Shortness of Breath   (Consider location/radiation/quality/duration/timing/severity/associated sxs/prior Treatment) HPI Comments: / PMHx of CAD (s/p DES of RCA on 06/08/13), PVD (s/p bilateral fem-pop bypass on 11/25/11), DM, HTN, HLD, seizures, and chronic pain on narcotics, presents to the ED w/ complaints of SOB. Patient was seen yesterday for similar complaints and released. He states he continues to have left-sided chest pain similar to this in mind as well as shortness of breath as been ongoing since he left the hospital. He also reports diarrhea this morning that he thinks might have been bloody but he did not check. Denies any vomiting. Denies any abdominal pain or back pain. Patient is very anxious and believes that he "caught" TTP from his Plavix.  The history is provided by the patient.   Past Medical History  Diagnosis Date  . Diabetes mellitus   . Hyperlipidemia   . Hypertension   . Arthritis   . Leg pain   . Seizure disorder   . Myocardial infarction     "silent heart attack by patient"  . Peripheral vascular disease   . Bronchitis   . 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 Seizures     last seizure "years ago"  . Coronary artery disease   . Chronic kidney disease     hx UTI, and kidney stones   Past Surgical History  Procedure Laterality Date  . Thrombectomy      left to right fem-fem bypass  . Hernia repair    . Aortogram with iliac artery stenting  05/06/2011  . Tonsillectomy    . Aorta - bilateral femoral artery bypass graft  11/25/2011    Procedure: AORTA BIFEMORAL BYPASS GRAFT;  Surgeon: Larina Earthly, MD;  Location: [facility] OR;  Service: Vascular;  Laterality: N/A;  . Pr vein bypass graft,aorto-fem-pop  11/24/12   Family  History  Problem Relation Age of Onset  . Hypertension Mother   . Heart attack Mother   . Diabetes Mother   . Aneurysm Father   . Heart attack Sister   . Heart disease Brother     heart transplant   History  Substance Use Topics  . 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 Smokeless tobacco: Not on file  . Alcohol Use: No     Comment: alcohol abuse in the past    Review of Systems  Constitutional: Negative for activity change, appetite change and fatigue.  HENT: Negative for congestion and rhinorrhea.   Respiratory: Positive for chest tightness and shortness of breath. Negative for cough.   Cardiovascular: Positive for chest pain.  Gastrointestinal: Positive for diarrhea and blood in stool. Negative for nausea, vomiting and abdominal pain.  Genitourinary: Negative for dysuria and hematuria.  Musculoskeletal: Negative for back pain.  Skin: Negative for rash.  Neurological: Positive for weakness. Negative for dizziness and headaches.  A complete 10 system review of systems was obtained and all systems are negative except as noted in the HPI and PMH.    Allergies  Penicillins and Zetia  Home Medications   Current Outpatient Rx  Name  Route  Sig  Dispense  Refill  . aspirin  81 MG EC tablet   Oral   Take 1 tablet (81 mg total) by mouth daily.         Marland Kitchen atorvastatin (LIPITOR) 80 MG tablet   Oral   Take 1 tablet (80 mg total) by mouth daily.   30 tablet   11   . clopidogrel (PLAVIX) 75 MG tablet   Oral   Take 1 tablet (75 mg total) by mouth daily.   30 tablet   11   . glipiZIDE (GLUCOTROL) 10 MG tablet   Oral   Take 10 mg by mouth 2 (two) times daily before a meal.           . losartan (COZAAR) 25 MG tablet   Oral   Take 1 tablet (25 mg total) by mouth daily.   30 tablet   11   . oxyCODONE (OXYCONTIN) 10 MG 12 hr tablet   Oral   Take 10 mg by mouth 3 (three) times daily as needed for pain.          . phenytoin (DILANTIN) 100 MG ER capsule   Oral   Take 200-300 mg by mouth 2 (two) times daily. Take 200 mg in the morning & 300 mg in the evening.         . zolpidem (AMBIEN) 5 MG tablet   Oral   Take 5 mg by mouth at bedtime as needed. For sleep         . nitroGLYCERIN (NITROSTAT) 0.4 MG SL tablet   Sublingual   Place 1 tablet (0.4 mg total) under the tongue every 5 (five) minutes as needed for chest pain.   25 tablet   3    BP 136/84  Pulse 49  Temp(Src) 97.8 F (36.6 C) (Oral)  Resp 11  SpO2 99% Physical Exam  Constitutional: He is oriented to person, place, and time. He appears well-developed and well-nourished.  HENT:  Head: Normocephalic and atraumatic.  Mouth/Throat: Oropharynx is clear and moist. No oropharyngeal exudate.  Ecchymosis to bilateral cheeks No gingival bleeding  Eyes: Conjunctivae and EOM are normal. Pupils are equal, round, and reactive to light.  Neck: Normal range of motion. Neck supple.  Cardiovascular: Normal rate, regular rhythm and normal heart sounds.   No murmur heard. Pulmonary/Chest: Effort normal and breath sounds normal. No respiratory distress. He has no wheezes.  Abdominal: Soft. There is no tenderness. There is no rebound and no guarding.  Musculoskeletal: Normal range of motion. He exhibits no edema and no tenderness.  Neurological: He is alert and oriented to person, place, and time. No cranial nerve deficit. He exhibits normal muscle tone. Coordination normal.  Skin: Skin is warm.  Ecchymosis to L arm    ED Course  Procedures (including critical care time) Labs Reviewed  CBC WITH DIFFERENTIAL - Abnormal; Notable for the following:    Platelets 147 (*)    All other components within normal limits  COMPREHENSIVE METABOLIC PANEL - Abnormal; Notable for the following:    Glucose, Bld 197 (*)    All other components within normal limits  PRO B NATRIURETIC PEPTIDE - Abnormal; Notable for the following:    Pro B Natriuretic  peptide (BNP) 171.6 (*)    All other components within normal limits  GLUCOSE, CAPILLARY - Abnormal; Notable for the following:    Glucose-Capillary 114 (*)    All other components within normal limits  TROPONIN I  PROTIME-INR  OCCULT BLOOD, POC DEVICE   Dg Chest 2 View  06/11/2013   *RADIOLOGY REPORT*  Clinical Data: Shortness of breath  CHEST - 2 VIEW  Comparison: 06/10/2013  Findings: The lungs are clear without focal consolidation, edema, effusion or pneumothorax.  Cardiopericardial silhouette is within normal limits for size.  Imaged bony structures of the thorax are intact. Telemetry leads overlie the chest.  IMPRESSION:  Stable.  No new or acute findings.   Original Report Authenticated By: Kennith Center, M.D.   Dg Chest 2 View  06/10/2013   *RADIOLOGY REPORT*  Clinical Data: Shortness of breath  CHEST - 2 VIEW  Comparison: 06/05/2013  Findings: Cardiomediastinal silhouette is stable.  No acute infiltrate or pleural effusion.  No pulmonary edema.  Bony thorax is unremarkable.  IMPRESSION: No active disease.  No significant change.   Original Report Authenticated By: Natasha Mead, M.D.   1. Dyspnea   2. NSTEMI (non-ST elevated myocardial infarction)     MDM  Patient with recent N. STEMI presenting with shortness of breath and chest pain as well as bruising in his mouth and skin and questionable bloody stools. Seen yesterday for same.  Hemoglobin stable.  Stool brown and FOBT negative. CXR negative.  Platelets 147.  No fever, anemia, renal failure, neuro change.  No evidence of TTP. D/w cardiology given worsening ST depressions and recent stent placement.  Dr. Eden Emms agrees patient is stable from cardiac standpoint. No evidence of TTP or GI bleed. Patient states he is out of his chronic pain medication.  I explained that the ED does not refill this medications and he needs to obtain all of his meds from one provider. He states he has an appointment in 2 days. Patient instructed to  return with worsening bleeding or bruising or any other concerns.    Date: 06/11/2013  Rate: 77  Rhythm: normal sinus rhythm  QRS Axis: normal  Intervals: normal  ST/T Wave abnormalities: ST depressions inferiorly and ST depressions laterally  Conduction Disutrbances:none  Narrative Interpretation: Worsening inferoLateral ST depressions  Old EKG Reviewed: changes noted        Glynn Octave, MD 06/11/13 1654

## 2013-06-11 NOTE — Telephone Encounter (Signed)
Pt called because he is having coffee-ground diarrhea and has purple spots in his mouth. Advised him he should come to the ER. He state he would do so.

## 2013-06-11 NOTE — Consult Note (Signed)
CARDIOLOGY CONSULT NOTE   Patient ID: [name] [name] MRN: [id number] DOB/AGE: July 06, 1951 62 y.o.  Admit date: 06/11/2013  Primary Physician   No primary provider on file. Primary Cardiologist   [facility]  Reason for Consultation   Possible SE Plavix  [name] [name] is a 62 y.o. male with a recent history of CAD.  He had a STEMI and was discharged on 06/08/2013. He developed some symptoms that concerned him. He came back to the emergency room yesterday for transient shortness of breath and diaphoresis. He was discharged home. Today, he had an episode of diarrhea and he was worried that he had TTP. He came to the emergency room.  In the emergency room, he was examined carefully and the signs and symptoms of TTP were discussed. He has a couple of areas of ecchymosis secondary to either the patient pinching himself, IV sticks or blood draws. These were discussed with the patient and he is aware to watch for increasing areas of ecchymosis but he was reassured that he has no signs or symptoms of TTP at this time. His labs were reviewed with him. They need to be compliant with his medications was emphasized. His chest x-ray and his ECG did not show concerning changes.  Currently, he is complaining of pain in the back of his neck which he was having prior to his initial admission and being hungry. The patient currently seems reassured that he is not having any of the major side effects from the Plavix. He is not currently having any chest pain or shortness of breath. Although he had an episode of diarrhea and initially called saying he had coffee-ground emesis, his stool guaiac was negative and he admits he did not look at the stool this morning.  Past Medical History  Diagnosis Date  . Diabetes mellitus   . Hyperlipidemia   . Hypertension   . Arthritis   . Leg pain   . Seizure disorder   . Myocardial infarction     "silent heart attack by patient"  . Peripheral vascular disease   .  Bronchitis   . 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 Seizures     last seizure "years ago"  . Coronary artery disease   . Chronic kidney disease     hx UTI, and kidney stones     Past Surgical History  Procedure Laterality Date  . Thrombectomy      left to right fem-fem bypass  . Hernia repair    . Aortogram with iliac artery stenting  05/06/2011  . Tonsillectomy    . Aorta - bilateral femoral artery bypass graft  11/25/2011    Procedure: AORTA BIFEMORAL BYPASS GRAFT;  Surgeon: Larina Earthly, MD;  Location: [facility] OR;  Service: Vascular;  Laterality: N/A;  . Pr vein bypass graft,aorto-fem-pop  11/24/12    Allergies  Allergen Reactions  . Penicillins Other (See Comments)    Unknown  . Zetia (Ezetimibe) Swelling    I have reviewed the patient's current medications  Prior to Admission medications   Medication Sig Start Date End Date Taking? Authorizing Provider  aspirin 81 MG EC tablet Take 1 tablet (81 mg total) by mouth daily. 06/08/13  Yes Rhonda G Barrett, PA-C  atorvastatin (LIPITOR) 80 MG tablet Take 1 tablet (80 mg total) by mouth daily. 06/08/13  Yes Rhonda G Barrett, PA-C  clopidogrel (PLAVIX) 75 MG tablet Take 1 tablet (75  mg total) by mouth daily. 06/08/13  Yes Rhonda G Barrett, PA-C  glipiZIDE (GLUCOTROL) 10 MG tablet Take 10 mg by mouth 2 (two) times daily before a meal.     Yes Historical Provider, MD  losartan (COZAAR) 25 MG tablet Take 1 tablet (25 mg total) by mouth daily. 06/08/13  Yes Rhonda G Barrett, PA-C  oxyCODONE (OXYCONTIN) 10 MG 12 hr tablet Take 10 mg by mouth 3 (three) times daily as needed for pain.   Yes Historical Provider, MD  phenytoin (DILANTIN) 100 MG ER capsule Take 200-300 mg by mouth 2 (two) times daily. Take 200 mg in the morning & 300 mg in the evening.   Yes Historical Provider, MD  zolpidem (AMBIEN) 5 MG tablet Take 5 mg by mouth at bedtime as needed. For sleep   Yes Historical Provider, MD    nitroGLYCERIN (NITROSTAT) 0.4 MG SL tablet Place 1 tablet (0.4 mg total) under the tongue every 5 (five) minutes as needed for chest pain. 06/08/13   Darrol Jump, PA-C     History   Social History  . Marital Status: Single    Spouse Name: N/A    Number of Children: N/A  . Years of Education: N/A   Occupational History  . Not on file.   Social History Main Topics  . 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 Smokeless tobacco: Not on file  . Alcohol Use: No     Comment: alcohol abuse in the past  . Drug Use: 1.00 per week    Special: Marijuana     Comment: stopped May 07, 2012  . Sexually Active: Not on file   Other Topics Concern  . Not on file   Social History Narrative  . No narrative on file    Family Status  Relation Status Death Age  . Mother Deceased 75  . Father Deceased 83  . Son Alive   . Son Alive    Family History  Problem Relation Age of Onset  . Hypertension Mother   . Heart attack Mother   . Diabetes Mother   . Aneurysm Father   . Heart attack Sister   . Heart disease Brother     heart transplant     ROS:  Full 14 point review of systems complete and found to be negative unless listed above.  Physical Exam: Blood pressure 109/77, pulse 50, temperature 97.8 F (36.6 C), temperature source Oral, resp. rate 15, SpO2 98.00%.  General: Well developed, well nourished, male in no acute distress Head: Eyes PERRLA, No xanthomas.   Normocephalic and atraumatic, oropharynx without edema or exudate. Dentition: Poor Lungs: Essentially clear to auscultation bilaterally  Heart: HRRR S1 S2, no rub/gallop, no significant  murmur. pulses are 2+ all 4 extrem.   Neck: No carotid bruits. No lymphadenopathy.  JVD not Elavil. Abdomen: Bowel sounds present, abdomen soft and non-tender without masses or hernias noted. Msk:  No spine or cva tenderness. No weakness, no joint deformities or  effusions. Extremities: No clubbing or cyanosis. No edema.  Neuro: Alert and oriented X 3. No focal deficits noted. Psych:  Good affect, responds appropriately; possible anxiety but responds to reassurance Skin: No rashes or lesions noted. Some minor areas of ecchymosis secondary to blood draws and IV sticks.  Labs:   Lab Results  Component Value Date   WBC 5.7 06/11/2013   HGB 14.2 06/11/2013   HCT 40.5 06/11/2013  MCV 91.0 06/11/2013   PLT 147* 06/11/2013    Recent Labs  06/11/13 1010  INR 1.04     Recent Labs Lab 06/11/13 1010  NA 136  K 3.8  CL 101  CO2 25  BUN 11  CREATININE 0.63  CALCIUM 9.5  PROT 6.6  BILITOT 0.4  ALKPHOS 75  ALT 19  AST 20  GLUCOSE 197*    Recent Labs  06/11/13 1009  TROPONINI <0.30   Pro B Natriuretic peptide (BNP)  Date/Time Value Range Status  06/11/2013 10:09 AM 171.6* 0 - 125 pg/mL Final  06/05/2013  9:52 PM 367.5* 0 - 125 pg/mL Final   Lab Results  Component Value Date   CHOL 271* 06/06/2013   HDL 39* 06/06/2013   LDLCALC 207* 06/06/2013   TRIG 125 06/06/2013   TSH  Date/Time Value Range Status  06/06/2013  3:45 AM 1.462  0.350 - 4.500 uIU/mL Final     ECG:  S bradycardia, evolving MI changes, no acute problems.  Radiology:  Dg Chest 2 View  06/11/2013   *RADIOLOGY REPORT*  Clinical Data: Shortness of breath  CHEST - 2 VIEW  Comparison: 06/10/2013  Findings: The lungs are clear without focal consolidation, edema, effusion or pneumothorax.  Cardiopericardial silhouette is within normal limits for size.  Imaged bony structures of the thorax are intact. Telemetry leads overlie the chest.  IMPRESSION:  Stable.  No new or acute findings.   Original Report Authenticated By: Kennith Center, M.D.   ASSESSMENT AND PLAN:   The patient was seen today by Dr Elease Hashimoto, the patient evaluated and the data reviewed.  Principal Problem:   Ecchymosis - Mr. Fleet had some small areas of ecchymosis that are in the areas where he they're had IV sites  were blood draws. There is also one area where he states he pinched himself. The patient was reassured that he does not have TTP. His labs were reviewed with him as well as the chest x-ray results and the ECG. Greater than 30 minutes was spent with the patient and reassuring him that his symptoms are not consistent with a recurrence of his MI or TTP.  Neck pain - the patient has had this in the past. He states he has an appointment to see his primary care physician on Monday. He was advised that cardiology does not manage noncardiac pain and he is requested to continue his current medications and followup with primary care.  No further inpatient cardiology workup is indicated.  SignedTheodore Demark, PA-C 06/11/2013 1:15 PM Beeper [phone]  Co-Sign MD   Attending Note:   The patient was seen and examined.  Agree with assessment and plan as noted above.  Changes made to the above note as needed.  Pt has lots of anxiety - mostly about TTP today.  He had a stent placed and is on Plavix and read that Plavix could possible cause TTP.  He has been worried about "catching" TTP. He has no cardiac complaints.  Exam is unremarkable and is above.  No need for further workup.  He has lots of anxiety issues.  He has come to the ER on 2 consecutive days with noncardiology issues and we have been consulted to make the disposition.  Some consideration should be given to consulting psychology or Internal medicine for these non-cardiac issues.    Vesta Mixer, Montez Hageman., MD, [facility] 06/11/2013, 1:36 PM

## 2013-06-11 NOTE — ED Notes (Signed)
Pt reports starting plavix yesterday. Recently had stent placed on Monday. Now having sob, purple spots in his mouth and diarrhea and coffee ground stools. Pt thinks its a reaction to the plavix. Airway intact, ekg done at triage.

## 2013-06-11 NOTE — ED Notes (Signed)
Patient transported to X-ray 

## 2013-06-11 NOTE — ED Notes (Signed)
Pt ambulated in hallway without difficulty. Pt given water for fluid challenge.

## 2013-06-18 ENCOUNTER — Telehealth: Payer: Self-pay | Admitting: Cardiology

## 2013-06-18 NOTE — Telephone Encounter (Signed)
Patient states past couple of days he has experienced mild to moderate discomfort at his right wrist s/p 06/07/13 Cath.  States mild tingling to fingers upon wakening but has improved throughout the day. Denies warmth, discoloration or swelling to cath site or arm/hand. Full flexibility. Has not taken any OTC pain medications for it. Just wanted to "let the doctor know, in case he thinks something is wrong since it was not hurting before yesterday except when I bumped it in to something". Patient has appt on 7/30 in office. Patient also states his blood pressure past month has been averaging 117/70 and he has not started taking his Cozaar as of yet. Instructed patient that message will be routed to MD's nurse and for him to please call back should he have any worsening symptoms, discoloration, warmth to site, swelling or new pain to wrist, hand or arm.

## 2013-06-18 NOTE — Telephone Encounter (Signed)
New Prob     Pt has some questions regarding his cath site. States it is still sore and has numbness in his fingers. Would like to speak to nurse.

## 2013-06-19 ENCOUNTER — Telehealth: Payer: Self-pay | Admitting: Physician Assistant

## 2013-06-19 NOTE — Telephone Encounter (Signed)
Patient called complaining of significant generalized muscle aches and cramping, which he attributed to Lipitor. He states that he started this medication on July 15. I advised him to stop the medication as of now, and then await further instructions from his cardiology team, with whom he is scheduled to follow later this week. He was quite appreciative of this recommendation.

## 2013-06-23 ENCOUNTER — Encounter: Payer: Self-pay | Admitting: Physician Assistant

## 2013-06-23 ENCOUNTER — Ambulatory Visit (INDEPENDENT_AMBULATORY_CARE_PROVIDER_SITE_OTHER): Payer: Medicaid Other | Admitting: Physician Assistant

## 2013-06-23 VITALS — BP 139/92 | HR 62 | Ht 74.0 in | Wt 157.0 lb

## 2013-06-23 DIAGNOSIS — I739 Peripheral vascular disease, unspecified: Secondary | ICD-10-CM

## 2013-06-23 DIAGNOSIS — Z720 Tobacco use: Secondary | ICD-10-CM

## 2013-06-23 DIAGNOSIS — I2510 Atherosclerotic heart disease of native coronary artery without angina pectoris: Secondary | ICD-10-CM

## 2013-06-23 DIAGNOSIS — E785 Hyperlipidemia, unspecified: Secondary | ICD-10-CM

## 2013-06-23 DIAGNOSIS — I2589 Other forms of chronic ischemic heart disease: Secondary | ICD-10-CM

## 2013-06-23 DIAGNOSIS — F17200 Nicotine dependence, unspecified, uncomplicated: Secondary | ICD-10-CM

## 2013-06-23 MED ORDER — LOSARTAN POTASSIUM 25 MG PO TABS: 12.5000 mg | ORAL_TABLET | Freq: Every day | ORAL | Status: DC

## 2013-06-23 MED ORDER — PRAVASTATIN SODIUM 40 MG PO TABS: ORAL_TABLET | ORAL | Status: DC

## 2013-06-23 NOTE — Patient Instructions (Addendum)
RE-START LOSARTAN 25 MG TAB, TAKE ONLY 1/2 (12.5 MG )TAB DAILY  START PRAVASTATIN 40 MG ; TAKE ONLY ON MON, WED, FRI'S  PLEASE FOLLOW UP WITH DR. Excell Seltzer IN 6-8 WEEKS, YOU CAN ALSO HAVE YOUR FASTING LIPID AND LIVER PANEL DONE THE SAME DAY.  PER SCOTT WEAVER, PAC YOU ARE TO REMAIN OUT OF WORK FOR THE NEXT 4 WEEKS WITH GRADUALLY RETURN BACK TO WORK

## 2013-06-23 NOTE — Progress Notes (Signed)
[address] [place], [place]  [address] Phone: [phone] Fax:  [phone]  Date:  06/23/2013   ID:  [name] [name], DOB 07/10/1951, MRN [id number]  PCP:  Mia Creek, MD  Cardiologist:  Dr. Tonny Bollman     History of Present Illness: [name] [name] is a 62 y.o. male who returns for f/u after a recent admission to the hospital with a NSTEMI.    He has a hx of PAD, s/p aortobifemoral bypass graft by Dr. Arbie Cookey, DM 2, HTN, HL, seizure disorder. He was admitted 7/12-7/15 with a non-STEMI. He presented to the hospital with intermittent chest pain. Echocardiogram 06/06/13: EF 35-40%, inferior AK, mild MR.  He was set up for cardiac catheterization. LHC 06/07/13: Proximal LAD 30%, ostial D1 30%, AV circumflex 80%, then occluded, ostial OM2 occluded, left to left collaterals, mid RCA 95%, inferior HK, EF 60%. PCI: Promus DES to the mid RCA.  No intervention was recommended for the CTO circumflex. Of note, patient did have some dizziness prior to discharge. Head CT was negative for acute findings. Patient has been to the emergency room a couple of times since discharge. Most recent visit was on 06/11/13 with c/o's bruising. Patient thought he was having side effects from Plavix. He was seen by Dr. Elease Hashimoto. He was not felt to be having any significant side effects of Plavix and medications were continued.  He has also called the answering service several times. Most recently, he complained of myalgias and his Lipitor was held.  Patient denies any recurrent chest pain. Denies significant dyspnea. He denies orthopnea, PND or edema. He denies syncope.  Labs (7/14):   K 3.8, creatinine 0.63, ALT 19, BNP 171.6, LDL 207, Hgb 14.2, TSH 1.462  Wt Readings from Last 3 Encounters:  06/23/13 157 lb (71.215 kg)  06/08/13 169 lb 8.5 oz (76.9 kg)  06/08/13 169 lb 8.5 oz (76.9 kg)     Past Medical History  Diagnosis Date  . Diabetes mellitus   . Hyperlipidemia   . Hypertension     . Arthritis   . Leg pain   . Seizure disorder   . Peripheral vascular disease     s/p prior Ao-Bifem BPG  . 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 Seizures     last seizure "years ago"  . Coronary artery disease     a. NSTEMI 7/14=> LHC 06/07/13: Proximal LAD 30%, ostial D1 30%, AV circumflex 80%, then occluded, ostial OM2 occluded, left to left collaterals, mid RCA 95%, inferior HK, EF 60%. PCI: Promus DES to the mid RCA.  No intervention was recommended for the CTO circumflex.  . Chronic kidney disease     hx UTI, and kidney stones  . Ischemic cardiomyopathy     a. Echocardiogram 06/06/13: EF 35-40%, inferior AK, mild MR.    Current Outpatient Prescriptions  Medication Sig Dispense Refill  . aspirin 81 MG EC tablet Take 1 tablet (81 mg total) by mouth daily.      . clopidogrel (PLAVIX) 75 MG tablet Take 1 tablet (75 mg total) by mouth daily.  30 tablet  11  . glipiZIDE (GLUCOTROL) 10 MG tablet Take 10 mg by mouth 2 (two) times daily before a meal.        . nitroGLYCERIN (NITROSTAT) 0.4 MG SL tablet Place 1 tablet (0.4 mg total) under the tongue every 5 (five) minutes as needed for  chest pain.  25 tablet  3  . oxyCODONE (OXYCONTIN) 10 MG 12 hr tablet Take 10 mg by mouth 3 (three) times daily as needed for pain.      . phenytoin (DILANTIN) 100 MG ER capsule Take 200-300 mg by mouth 2 (two) times daily. Take 200 mg in the morning & 300 mg in the evening.      . zolpidem (AMBIEN) 5 MG tablet Take 5 mg by mouth at bedtime as needed. For sleep       No current facility-administered medications for this visit.    Allergies:    Allergies  Allergen Reactions  . Penicillins Other (See Comments)    Unknown  . Zetia (Ezetimibe) Swelling    Social History:  The patient  reports that he has been smoking Cigarettes.  He has a 44 pack-year smoking history. He does not have any smokeless tobacco history on file. He reports that he uses illicit  drugs (Marijuana) about once per week. He reports that he does not drink alcohol.   ROS:  Please see the history of present illness.   He denies melena, hematochezia, hematuria.   All other systems reviewed and negative.   PHYSICAL EXAM: VS:  BP 139/92  Pulse 62  Ht 6\' 2"  (1.88 m)  Wt 157 lb (71.215 kg)  BMI 20.15 kg/m2 Well nourished, well developed, in no acute distress HEENT: normal Neck: no JVD Cardiac:  normal S1, S2; RRR; no murmur Lungs:  clear to auscultation bilaterally, no wheezing, rhonchi or rales Abd: soft, nontender, no hepatomegaly Ext: no edema; right wrist without hematoma or mass Skin: warm and dry Neuro:  CNs 2-12 intact, no focal abnormalities noted  EKG:  NSR, HR 62, normal axis, no acute changes     ASSESSMENT AND PLAN:  1. CAD: No further angina.  We discussed the importance of dual antiplatelet therapy. He plans to start cardiac rehabilitation soon. He paints houses for a living and typically will be in the hot sun for several hours. I have advised him to continue to remain out of work for 4 weeks to allow complete recovery from his myocardial infarction.   2. Ischemic Cardiomyopathy:  Beta blocker is contraindicated due to significant bradycardia noted in the hospital. Patient was started on an ARB. He apparently had difficulty taking an ACE inhibitor in the past. He stopped the ARB on his own due to weakness. He tells me his blood pressures at home are usually optimal. I have encouraged him to try taking half a tablet of losartan. Hopefully he will be able to tolerate this. 3. Hyperlipidemia: He stopped Lipitor due to myalgias. He reports significant myalgias with Crestor in the past. I have asked him to try pravastatin 40 mg each bedtime on Monday, Wednesday, Friday. If he can tolerate this, check lipids and LFTs in 6-8 weeks. 4. PAD: Follow up with [facility] as planned. 5. Diabetes Mellitus: Continue follow up with primary care. 6. Tobacco Abuse: I stressed to him  today the importance of tobacco cessation. 7. Disposition: Follow up with Dr. Excell Seltzer in 6-8 weeks  Signed, Tereso Newcomer, PA-C  06/23/2013 12:29 PM

## 2013-06-28 ENCOUNTER — Encounter: Payer: Self-pay | Admitting: Vascular Surgery

## 2013-06-29 ENCOUNTER — Ambulatory Visit: Payer: Medicaid Other | Admitting: Vascular Surgery

## 2013-07-02 ENCOUNTER — Emergency Department (HOSPITAL_COMMUNITY): Payer: Medicaid Other

## 2013-07-02 ENCOUNTER — Observation Stay (HOSPITAL_COMMUNITY)
Admission: EM | Admit: 2013-07-02 | Discharge: 2013-07-02 | Discharge status: Against Medical Advice | Payer: Medicaid Other | Attending: Emergency Medicine | Admitting: Emergency Medicine

## 2013-07-02 ENCOUNTER — Encounter (HOSPITAL_COMMUNITY): Payer: Self-pay | Admitting: *Deleted

## 2013-07-02 DIAGNOSIS — R5383 Other fatigue: Secondary | ICD-10-CM | POA: Insufficient documentation

## 2013-07-02 DIAGNOSIS — I252 Old myocardial infarction: Secondary | ICD-10-CM | POA: Insufficient documentation

## 2013-07-02 DIAGNOSIS — F419 Anxiety disorder, unspecified: Secondary | ICD-10-CM

## 2013-07-02 DIAGNOSIS — R42 Dizziness and giddiness: Principal | ICD-10-CM | POA: Insufficient documentation

## 2013-07-02 DIAGNOSIS — G40909 Epilepsy, unspecified, not intractable, without status epilepticus: Secondary | ICD-10-CM | POA: Insufficient documentation

## 2013-07-02 DIAGNOSIS — R0602 Shortness of breath: Secondary | ICD-10-CM | POA: Insufficient documentation

## 2013-07-02 DIAGNOSIS — F329 Major depressive disorder, single episode, unspecified: Secondary | ICD-10-CM | POA: Insufficient documentation

## 2013-07-02 DIAGNOSIS — R5381 Other malaise: Secondary | ICD-10-CM | POA: Insufficient documentation

## 2013-07-02 DIAGNOSIS — F17200 Nicotine dependence, unspecified, uncomplicated: Secondary | ICD-10-CM | POA: Insufficient documentation

## 2013-07-02 DIAGNOSIS — E785 Hyperlipidemia, unspecified: Secondary | ICD-10-CM

## 2013-07-02 DIAGNOSIS — F411 Generalized anxiety disorder: Secondary | ICD-10-CM | POA: Insufficient documentation

## 2013-07-02 DIAGNOSIS — F341 Dysthymic disorder: Secondary | ICD-10-CM

## 2013-07-02 DIAGNOSIS — F3289 Other specified depressive episodes: Secondary | ICD-10-CM | POA: Insufficient documentation

## 2013-07-02 DIAGNOSIS — Z9861 Coronary angioplasty status: Secondary | ICD-10-CM | POA: Insufficient documentation

## 2013-07-02 DIAGNOSIS — E119 Type 2 diabetes mellitus without complications: Secondary | ICD-10-CM | POA: Insufficient documentation

## 2013-07-02 DIAGNOSIS — I10 Essential (primary) hypertension: Secondary | ICD-10-CM | POA: Insufficient documentation

## 2013-07-02 DIAGNOSIS — I739 Peripheral vascular disease, unspecified: Secondary | ICD-10-CM | POA: Insufficient documentation

## 2013-07-02 LAB — BASIC METABOLIC PANEL
BUN: 10 mg/dL (ref 6–23)
CO2: 25 mEq/L (ref 19–32)
Calcium: 9.7 mg/dL (ref 8.4–10.5)
Creatinine, Ser: 0.75 mg/dL (ref 0.50–1.35)
Glucose, Bld: 71 mg/dL (ref 70–99)

## 2013-07-02 LAB — CBC
MCH: 32.5 pg (ref 26.0–34.0)
MCV: 92.1 fL (ref 78.0–100.0)
Platelets: 150 10*3/uL (ref 150–400)
RBC: 4.55 MIL/uL (ref 4.22–5.81)

## 2013-07-02 LAB — GLUCOSE, CAPILLARY: Glucose-Capillary: 72 mg/dL (ref 70–99)

## 2013-07-02 MED ORDER — ASPIRIN 81 MG PO CHEW: 324.0000 mg | CHEWABLE_TABLET | Freq: Once | ORAL | Status: DC

## 2013-07-02 NOTE — ED Notes (Signed)
The pt is c/o weakness and dizziness for 2 days he has no energy.  He had a stent placed in his heart July 15th .  No pain

## 2013-07-02 NOTE — ED Notes (Signed)
Patient up for discharge when informed that he was going home he stated "I dont feel good, I am not leaving, I just don't feel right"  Informed Dr Jodi Mourning and he called cardiology and referred the hospitalist for admission

## 2013-07-02 NOTE — ED Notes (Signed)
Dr. Nedra Hai in to see patient waiting on room assignment

## 2013-07-02 NOTE — H&P (Signed)
[name] History and Physical  [name] [name] WUJ:[id number] DOB: July 02, 1951    PCP:   Mia Creek, MD   Chief Complaint: lightheadedness.  HPI: [name] [name] is an 62 y.o. male with hx of DM, HTN, Sz disorder, PVD, hyperlipidemia, recent NSTEMI, s/p cardiac stent placement, presents to the ER with lightheadedness.  He dnied any CP, shortness of breath, fever, chills, palpitation or vertigo.  His work up was negative, so he was being discharged home from the ER, but he felt lightheadedness, so hospitalist was asked to admit him for presyncope work up.  He admitted to being anxious and wonder if he has had panic attack.  Rewiew of Systems:  Constitutional: Negative for malaise, fever and chills. No significant weight loss or weight gain Eyes: Negative for eye pain, redness and discharge, diplopia, visual changes, or flashes of light. ENMT: Negative for ear pain, hoarseness, nasal congestion, sinus pressure and sore throat. No headaches; tinnitus, drooling, or problem swallowing. Cardiovascular: Negative for chest pain, palpitations, diaphoresis, dyspnea and peripheral edema. ; No orthopnea, PND Respiratory: Negative for cough, hemoptysis, wheezing and stridor. No pleuritic chestpain. Gastrointestinal: Negative for nausea, vomiting, diarrhea, constipation, abdominal pain, melena, blood in stool, hematemesis, jaundice and rectal bleeding.    Genitourinary: Negative for frequency, dysuria, incontinence,flank pain and hematuria; Musculoskeletal: Negative for back pain and neck pain. Negative for swelling and trauma.;  Skin: . Negative for pruritus, rash, abrasions, bruising and skin lesion.; ulcerations Neuro: Negative for headache, lightheadedness and neck stiffness. Negative for weakness, altered level of consciousness , altered mental status, extremity weakness, burning feet, involuntary movement, seizure and syncope.  Psych: negative for depression, insomnia, tearfulness,  panic attacks, hallucinations, paranoia, suicidal or homicidal ideation    Past Medical History  Diagnosis Date  . Diabetes mellitus   . Hyperlipidemia   . Hypertension   . Arthritis   . Leg pain   . Seizure disorder   . Peripheral vascular disease     s/p prior Ao-Bifem BPG  . 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 Seizures     last seizure "years ago"  . Coronary artery disease     a. NSTEMI 7/14=> LHC 06/07/13: Proximal LAD 30%, ostial D1 30%, AV circumflex 80%, then occluded, ostial OM2 occluded, left to left collaterals, mid RCA 95%, inferior HK, EF 60%. PCI: Promus DES to the mid RCA.  No intervention was recommended for the CTO circumflex.  . Chronic kidney disease     hx UTI, and kidney stones  . Ischemic cardiomyopathy     a. Echocardiogram 06/06/13: EF 35-40%, inferior AK, mild MR.    Past Surgical History  Procedure Laterality Date  . Thrombectomy      left to right fem-fem bypass  . Hernia repair    . Aortogram with iliac artery stenting  05/06/2011  . Tonsillectomy    . Aorta - bilateral femoral artery bypass graft  11/25/2011    Procedure: AORTA BIFEMORAL BYPASS GRAFT;  Surgeon: Larina Earthly, MD;  Location: [facility] OR;  Service: Vascular;  Laterality: N/A;  . Pr vein bypass graft,aorto-fem-pop  11/24/12    Medications:  HOME MEDS: Prior to Admission medications   Medication Sig Start Date End Date Taking? Authorizing Provider  aspirin 81 MG EC tablet Take 1 tablet (81 mg total) by mouth daily. 06/08/13  Yes Rhonda G Barrett, PA-C  clopidogrel (PLAVIX) 75 MG tablet Take 1 tablet (75  mg total) by mouth daily. 06/08/13  Yes Rhonda G Barrett, PA-C  glipiZIDE (GLUCOTROL) 10 MG tablet Take 10 mg by mouth 2 (two) times daily before a meal.     Yes Historical Provider, MD  oxyCODONE (OXYCONTIN) 10 MG 12 hr tablet Take 10 mg by mouth 3 (three) times daily as needed for pain.   Yes Historical Provider, MD  phenytoin (DILANTIN) 100  MG ER capsule Take 200-300 mg by mouth 2 (two) times daily. Take 200 mg in the morning & 300 mg in the evening.   Yes Historical Provider, MD  zolpidem (AMBIEN) 5 MG tablet Take 5 mg by mouth at bedtime as needed. For sleep   Yes Historical Provider, MD  nitroGLYCERIN (NITROSTAT) 0.4 MG SL tablet Place 1 tablet (0.4 mg total) under the tongue every 5 (five) minutes as needed for chest pain. 06/08/13   Joline Salt Barrett, PA-C     Allergies:  Allergies  Allergen Reactions  . Crestor (Rosuvastatin) Other (See Comments)    Malaise & muscle weakness  . Lipitor (Atorvastatin) Other (See Comments)    Malaise & muscle weakness  . Penicillins Other (See Comments)    Unknown  . Zetia (Ezetimibe) Swelling    Social History:   reports that he has been smoking Cigarettes.  He has a 44 pack-year smoking history. He does not have any smokeless tobacco history on file. He reports that he uses illicit drugs (Marijuana) about once per week. He reports that he does not drink alcohol.  Family History: Family History  Problem Relation Age of Onset  . Hypertension Mother   . Heart attack Mother   . Diabetes Mother   . Aneurysm Father   . Heart attack Sister   . Heart disease Brother     heart transplant     Physical Exam: Filed Vitals:   07/02/13 2100 07/02/13 2100 07/02/13 2101 07/02/13 2139  BP:  146/92 146/96   Pulse: 65 65 66 51  Temp:      Resp:    17  SpO2: 98% 99%  99%   Blood pressure 146/96, pulse 51, temperature 98.2 F (36.8 C), resp. rate 17, SpO2 99.00%.  GEN:  Pleasant  patient lying in the stretcher in no acute distress; cooperative with exam. PSYCH:  alert and oriented x4; does not appear anxious or depressed; affect is appropriate. HEENT: Mucous membranes pink and anicteric; PERRLA; EOM intact; no cervical lymphadenopathy nor thyromegaly or carotid bruit; no JVD; There were no stridor. Neck is very supple. Breasts:: Not examined CHEST WALL: No tenderness CHEST: Normal  respiration, clear to auscultation bilaterally.  HEART: Regular rate and rhythm.  There are no murmur, rub, or gallops.   BACK: No kyphosis or scoliosis; no CVA tenderness ABDOMEN: soft and non-tender; no masses, no organomegaly, normal abdominal bowel sounds; no pannus; no intertriginous candida. There is no rebound and no distention. Rectal Exam: Not done EXTREMITIES: No bone or joint deformity; age-appropriate arthropathy of the hands and knees; no edema; no ulcerations.  There is no calf tenderness. Genitalia: not examined PULSES: 2+ and symmetric SKIN: Normal hydration no rash or ulceration CNS: Cranial nerves 2-12 grossly intact no focal lateralizing neurologic deficit.  Speech is fluent; uvula elevated with phonation, facial symmetry and tongue midline. DTR are normal bilaterally, cerebella exam is intact, barbinski is negative and strengths are equaled bilaterally.  No sensory loss.   Labs on Admission:  Basic Metabolic Panel:  Recent Labs Lab 07/02/13 1715  NA 139  K 3.7  CL 104  CO2 25  GLUCOSE 71  BUN 10  CREATININE 0.75  CALCIUM 9.7   Liver Function Tests: No results found for this basename: AST, ALT, ALKPHOS, BILITOT, PROT, ALBUMIN,  in the last 168 hours No results found for this basename: LIPASE, AMYLASE,  in the last 168 hours No results found for this basename: AMMONIA,  in the last 168 hours CBC:  Recent Labs Lab 07/02/13 1715  WBC 5.9  HGB 14.8  HCT 41.9  MCV 92.1  PLT 150   Cardiac Enzymes:  Recent Labs Lab 07/02/13 2220  TROPONINI <0.30    CBG:  Recent Labs Lab 07/02/13 1731  GLUCAP 72     Radiological Exams on Admission: Dg Chest 2 View  07/02/2013   *RADIOLOGY REPORT*  Clinical Data: Weakness today.  Recent cardiac stent placement. History of diabetes and seizures.  CHEST - 2 VIEW  Comparison: 06/11/2013 and 06/10/2013.  Findings: The heart size and mediastinal contours are stable. Coronary artery stent is visible on the lateral view.   There is a mild pectus excavatum deformity.  There is new linear density in the right perihilar region, likely representing atelectasis.  The left lung is clear and there is no pleural effusion or pneumothorax.  IMPRESSION: New perihilar atelectasis on the right.  No other significant findings.   Original Report Authenticated By: Carey Bullocks, M.D.    EKG: Independently reviewed. NSR with no acute ST-T changes.   Assessment/Plan Present on Admission:  . Light headedness . Anxiety and depression . Tobacco abuse . NSTEMI (non-ST elevated myocardial infarction) . Dyslipidemia . Type 2 diabetes mellitus . Seizure disorder  PLAN:  Will admit him for presyncope.  I think he has signnificant anxiety playing into his symptoms.  His HR is a little low, but his BP is OK.  Will continue his meds, cycle his enzymes, and continue his other meds.  I will add anxiolytic to his regimen.  He is stable, full code, and will be admitted to [facility] service.    Other plans as per orders.  Code Status: FULL Unk Lightning, MD. [name] Pager [phone] 7pm to 7am.  07/02/2013, 11:32 PM

## 2013-07-02 NOTE — ED Notes (Signed)
Patient out in the hall yelling using profanity asking "do I have a damn nurse, I am hungry and my neck hurts, I am not getting any service" , I went in to see what patient needed and explained that we were waiting on a bed assignment and he began cursing again stating that this is "bullshit and he is going home and we haven't done anything for him since he's been here. Dr. Jodi Mourning spoke with patient to convince him to stay and patient said he is leaving.

## 2013-07-02 NOTE — ED Provider Notes (Signed)
CSN: [id number]     Arrival date & time 07/02/13  1659 History     First MD Initiated Contact with Patient 07/02/13 1804     Chief Complaint  Patient presents with  . Weakness   (Consider location/radiation/quality/duration/timing/severity/associated sxs/prior Treatment) HPI Comments: 62 yo male with DM, PVD,  PVD, NSTEMI, stent placed 2 wks ago, Dr Gwynneth Macleod presents with general weakness and intermittent lightheadedness for 24 hrs.  Pt feels improved since in the ED, no sxs currently.  Pt had brief episode of sob that resolved.  Since stent he has taken meds as directed and the past few days he has been doing a lot more than usual and feels he may be pushing it too hard.  No cp.  No leg swelling. Nothing spec worsens.  No exertional sxs except mild fatigue.     Patient is a 62 y.o. male presenting with weakness. The history is provided by the patient.  Weakness This is a recurrent problem. Pertinent negatives include no chest pain, no abdominal pain and no headaches.    Past Medical History  Diagnosis Date  . Diabetes mellitus   . Hyperlipidemia   . Hypertension   . Arthritis   . Leg pain   . Seizure disorder   . Peripheral vascular disease     s/p prior Ao-Bifem BPG  . 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 Seizures     last seizure "years ago"  . Coronary artery disease     a. NSTEMI 7/14=> LHC 06/07/13: Proximal LAD 30%, ostial D1 30%, AV circumflex 80%, then occluded, ostial OM2 occluded, left to left collaterals, mid RCA 95%, inferior HK, EF 60%. PCI: Promus DES to the mid RCA.  No intervention was recommended for the CTO circumflex.  . Chronic kidney disease     hx UTI, and kidney stones  . Ischemic cardiomyopathy     a. Echocardiogram 06/06/13: EF 35-40%, inferior AK, mild MR.   Past Surgical History  Procedure Laterality Date  . Thrombectomy      left to right fem-fem bypass  . Hernia repair    . Aortogram with  iliac artery stenting  05/06/2011  . Tonsillectomy    . Aorta - bilateral femoral artery bypass graft  11/25/2011    Procedure: AORTA BIFEMORAL BYPASS GRAFT;  Surgeon: Larina Earthly, MD;  Location: [facility] OR;  Service: Vascular;  Laterality: N/A;  . Pr vein bypass graft,aorto-fem-pop  11/24/12   Family History  Problem Relation Age of Onset  . Hypertension Mother   . Heart attack Mother   . Diabetes Mother   . Aneurysm Father   . Heart attack Sister   . Heart disease Brother     heart transplant   History  Substance Use Topics  . 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 Smokeless tobacco: Not on file  . Alcohol Use: No     Comment: alcohol abuse in the past    Review of Systems  Constitutional: Positive for fatigue. Negative for fever and chills.  HENT: Negative for neck pain and neck stiffness.   Eyes: Negative for visual disturbance.  Respiratory: Negative for cough.   Cardiovascular: Negative for chest pain.  Gastrointestinal: Negative for vomiting and abdominal pain.  Genitourinary: Negative for dysuria and flank pain.  Musculoskeletal: Negative for back pain.  Skin: Negative for rash.  Neurological: Positive for weakness and light-headedness. Negative for headaches.    Allergies  Crestor; Lipitor; Penicillins; and Zetia  Home Medications   Current Outpatient Rx  Name  Route  Sig  Dispense  Refill  . aspirin 81 MG EC tablet   Oral   Take 1 tablet (81 mg total) by mouth daily.         . clopidogrel (PLAVIX) 75 MG tablet   Oral   Take 1 tablet (75 mg total) by mouth daily.   30 tablet   11   . glipiZIDE (GLUCOTROL) 10 MG tablet   Oral   Take 10 mg by mouth 2 (two) times daily before a meal.           . oxyCODONE (OXYCONTIN) 10 MG 12 hr tablet   Oral   Take 10 mg by mouth 3 (three) times daily as needed for pain.         . phenytoin (DILANTIN) 100 MG ER capsule   Oral   Take 200-300 mg by mouth 2  (two) times daily. Take 200 mg in the morning & 300 mg in the evening.         . zolpidem (AMBIEN) 5 MG tablet   Oral   Take 5 mg by mouth at bedtime as needed. For sleep         . nitroGLYCERIN (NITROSTAT) 0.4 MG SL tablet   Sublingual   Place 1 tablet (0.4 mg total) under the tongue every 5 (five) minutes as needed for chest pain.   25 tablet   3    BP 129/86  Pulse 70  Temp(Src) 98.2 F (36.8 C)  Resp 20  SpO2 97% Physical Exam  Nursing note and vitals reviewed. Constitutional: He is oriented to person, place, and time. He appears well-developed and well-nourished.  HENT:  Head: Normocephalic and atraumatic.  Mild dry mm  Eyes: Conjunctivae are normal. Right eye exhibits no discharge. Left eye exhibits no discharge.  Neck: Normal range of motion. Neck supple. No tracheal deviation present.  Cardiovascular: Normal rate and regular rhythm.   No murmur heard. Pulmonary/Chest: Effort normal and breath sounds normal.  Abdominal: Soft. He exhibits no distension. There is no tenderness. There is no guarding.  Musculoskeletal: He exhibits no edema.  Neurological: He is alert and oriented to person, place, and time.  Skin: Skin is warm. No rash noted.  Psychiatric: He has a normal mood and affect.    ED Course   Procedures (including critical care time)  Labs Reviewed  CBC  BASIC METABOLIC PANEL  GLUCOSE, CAPILLARY  TROPONIN I  POCT I-STAT TROPONIN I   No results found. No diagnosis found.  MDM  G weakness.  No sxs currently.  Discussed cardiac/ lab evaluation to ensure no signs of chf or Stent thrombosis/ MI.   Pt has close outpt fup/ appt arranged next week.   PO fluids/ food given.  Pt feels at baseline.   Date: 07/02/2013  Rate: 72  Rhythm: normal sinus rhythm  QRS Axis: normal  Intervals: normal  ST/T Wave abnormalities: nonspecific ST changes  Conduction Disutrbances:none  Narrative Interpretation:   Old EKG Reviewed: subtle decressions  inferior  Patient had no sxs for 4 hrs in ED, spoke with cardiology who agreed with outpt fup. On discussion for discharge and pt standing/ having repeat vitals he started to feel lightheaded and not well again. Recommended tele observation. ASA and cardiology repaged.  Repeat EKG  Date: 07/02/2013  Rate: 72  Rhythm: normal sinus rhythm  QRS Axis: normal  Intervals: QT prolonged  ST/T Wave abnormalities: nonspecific ST changes subtle ST depressions similar to previous  Conduction Disutrbances:none  Narrative Interpretation:   Old EKG Reviewed: unchanged EKGs reviewed.  Dg Chest 2 View  07/02/2013   *RADIOLOGY REPORT*  Clinical Data: Weakness today.  Recent cardiac stent placement. History of diabetes and seizures.  CHEST - 2 VIEW  Comparison: 06/11/2013 and 06/10/2013.  Findings: The heart size and mediastinal contours are stable. Coronary artery stent is visible on the lateral view.  There is a mild pectus excavatum deformity.  There is new linear density in the right perihilar region, likely representing atelectasis.  The left lung is clear and there is no pleural effusion or pneumothorax.  IMPRESSION: New perihilar atelectasis on the right.  No other significant findings.   Original Report Authenticated By: Carey Bullocks, M.D.    Medicine evaluated and agreed with tele.  Pt then changed his mind and wanted to leave AMA.  Pt has cardiology to fup with.  He understand this may be his heart, that is why he came in.  He has capacity to make decisions.      Enid Skeens, MD 07/04/13 [phone]

## 2013-07-02 NOTE — ED Notes (Signed)
Pt request to not have IV.

## 2013-07-06 ENCOUNTER — Telehealth: Payer: Self-pay | Admitting: Physician Assistant

## 2013-07-06 NOTE — Telephone Encounter (Signed)
New problem   Per pt having sob since having stent placed

## 2013-07-06 NOTE — Telephone Encounter (Signed)
Spoke with patient and he started having shortness of breath over the last few days. Patient denies any swelling. Advised to contact PCP since Dr Antoine Poche out of the office. Patient verbalized understanding.

## 2013-07-08 ENCOUNTER — Encounter (HOSPITAL_COMMUNITY)
Admission: RE | Admit: 2013-07-08 | Discharge: 2013-07-08 | Disposition: A | Discharge status: Home / Self Care | Payer: Medicaid Other | Source: Ambulatory Visit | Attending: Cardiovascular Disease | Admitting: Cardiovascular Disease

## 2013-07-08 DIAGNOSIS — I2589 Other forms of chronic ischemic heart disease: Secondary | ICD-10-CM | POA: Insufficient documentation

## 2013-07-08 DIAGNOSIS — I2510 Atherosclerotic heart disease of native coronary artery without angina pectoris: Secondary | ICD-10-CM | POA: Insufficient documentation

## 2013-07-08 DIAGNOSIS — I214 Non-ST elevation (NSTEMI) myocardial infarction: Secondary | ICD-10-CM | POA: Insufficient documentation

## 2013-07-08 DIAGNOSIS — Z7982 Long term (current) use of aspirin: Secondary | ICD-10-CM | POA: Insufficient documentation

## 2013-07-08 DIAGNOSIS — Z7902 Long term (current) use of antithrombotics/antiplatelets: Secondary | ICD-10-CM | POA: Insufficient documentation

## 2013-07-08 DIAGNOSIS — Z5189 Encounter for other specified aftercare: Secondary | ICD-10-CM | POA: Insufficient documentation

## 2013-07-08 DIAGNOSIS — Z8249 Family history of ischemic heart disease and other diseases of the circulatory system: Secondary | ICD-10-CM | POA: Insufficient documentation

## 2013-07-08 DIAGNOSIS — I498 Other specified cardiac arrhythmias: Secondary | ICD-10-CM | POA: Insufficient documentation

## 2013-07-08 NOTE — Progress Notes (Signed)
Cardiac Rehab Medication Review by a Pharmacist  Does the patient  feel that his/her medications are working for him/her?  yes  Has the patient been experiencing any side effects to the medications prescribed?  no  Does the patient measure his/her own blood pressure or blood glucose at home?  no   Does the patient have any problems obtaining medications due to transportation or finances?   no  Understanding of regimen: fair Understanding of indications: fair Potential of compliance: fair    Pharmacist comments:  62 yo M presenting for the first time. Patient did not have a list of medications with him nor was he able to recall which medications he was taking. I spent a substantial amount of time speaking with him about the indications for use and the importance of compliance. He was very appreciative of the time I spent with him and seemed to have a better understanding of his medications at the end of our session. Patient also stated that he smokes marijuana regularly which I discouraged. Overall patient did not seem to have a good understanding of his regimen and indications and likely needs reinforcement.     Margie Billet, PharmD Clinical Pharmacist - Resident Pager: [phone] Pharmacy: [phone] 07/08/2013 9:42 AM

## 2013-07-12 ENCOUNTER — Encounter (HOSPITAL_COMMUNITY): Admission: RE | Admit: 2013-07-12 | Payer: Medicaid Other | Source: Ambulatory Visit

## 2013-07-14 ENCOUNTER — Encounter (HOSPITAL_COMMUNITY)
Admission: RE | Admit: 2013-07-14 | Discharge: 2013-07-14 | Disposition: A | Discharge status: Home / Self Care | Payer: Medicaid Other | Source: Ambulatory Visit | Attending: Cardiovascular Disease | Admitting: Cardiovascular Disease

## 2013-07-14 LAB — GLUCOSE, CAPILLARY: Glucose-Capillary: 157 mg/dL — ABNORMAL HIGH (ref 70–99)

## 2013-07-14 NOTE — Progress Notes (Signed)
Pt in today for first day of exercise at the 6:45 exercise class time.  Pt tolerated light exercise with some difficulty due to "sedentary lifestyle".  Pt needed the assistance of rolling walker for ambulation with rest breaks.  Monitor showed Sr with occasional PVC's.  PHQ2 score - 1.  Pt admits to poor coping skills with the use of marijuana, but reports that he has quit this habit.  Pt demonstrates a strong faith base as a support to hime.

## 2013-07-16 ENCOUNTER — Encounter (HOSPITAL_COMMUNITY)
Admission: RE | Admit: 2013-07-16 | Discharge: 2013-07-16 | Disposition: A | Discharge status: Home / Self Care | Payer: Medicaid Other | Source: Ambulatory Visit | Attending: Cardiovascular Disease | Admitting: Cardiovascular Disease

## 2013-07-16 NOTE — Progress Notes (Signed)
Pt arrived late to cardiac rehab exercise class at 0705 for 6:45 exercise class time. Pt with noted weight gain from previous session.  Pt ate at [facility] the night before.  Pt denies any swelling or shortness of breath.  Continue to monitor.  Pt asked a second time for copy of his medicaid card.  Pt stated that he didn't have it with him.  Pt instructed to bring the card in on Monday.

## 2013-07-19 ENCOUNTER — Encounter (HOSPITAL_COMMUNITY)
Admission: RE | Admit: 2013-07-19 | Discharge: 2013-07-19 | Disposition: A | Discharge status: Home / Self Care | Payer: Medicaid Other | Source: Ambulatory Visit | Attending: Cardiovascular Disease | Admitting: Cardiovascular Disease

## 2013-07-21 ENCOUNTER — Encounter (HOSPITAL_COMMUNITY)
Admission: RE | Admit: 2013-07-21 | Discharge: 2013-07-21 | Disposition: A | Discharge status: Home / Self Care | Payer: Medicaid Other | Source: Ambulatory Visit | Attending: Cardiovascular Disease | Admitting: Cardiovascular Disease

## 2013-07-21 LAB — GLUCOSE, CAPILLARY: Glucose-Capillary: 168 mg/dL — ABNORMAL HIGH (ref 70–99)

## 2013-07-23 ENCOUNTER — Encounter: Payer: Self-pay | Admitting: Vascular Surgery

## 2013-07-23 ENCOUNTER — Encounter (HOSPITAL_COMMUNITY)
Admission: RE | Admit: 2013-07-23 | Discharge: 2013-07-23 | Disposition: A | Discharge status: Home / Self Care | Payer: Medicaid Other | Source: Ambulatory Visit | Attending: Cardiovascular Disease | Admitting: Cardiovascular Disease

## 2013-07-23 NOTE — Progress Notes (Signed)
Reviewed home exercise with pt today.  Pt plans to walk in his neighborhood for exercise.  He will initiate his exercise in 10 minute increments, 3 times per day until he can increase his exercise tolerance.  We discussed the goal and importance of walking for 30 minutes, every day and including hand weights into his regimen.  Pt may need reinforcement to maintain his activity at home.  Reviewed THR, pulse, RPE, sign and symptoms, NTG use, and when to call 911 or MD.  Pt voiced understanding.  Alexia Freestone, MS, ACSM RCEP 9:44 AM

## 2013-07-26 ENCOUNTER — Encounter (HOSPITAL_COMMUNITY): Payer: Medicaid Other

## 2013-07-26 DIAGNOSIS — I214 Non-ST elevation (NSTEMI) myocardial infarction: Secondary | ICD-10-CM | POA: Insufficient documentation

## 2013-07-26 DIAGNOSIS — I2510 Atherosclerotic heart disease of native coronary artery without angina pectoris: Secondary | ICD-10-CM | POA: Insufficient documentation

## 2013-07-26 DIAGNOSIS — Z7982 Long term (current) use of aspirin: Secondary | ICD-10-CM | POA: Insufficient documentation

## 2013-07-26 DIAGNOSIS — I498 Other specified cardiac arrhythmias: Secondary | ICD-10-CM | POA: Insufficient documentation

## 2013-07-26 DIAGNOSIS — Z8249 Family history of ischemic heart disease and other diseases of the circulatory system: Secondary | ICD-10-CM | POA: Insufficient documentation

## 2013-07-26 DIAGNOSIS — Z5189 Encounter for other specified aftercare: Secondary | ICD-10-CM | POA: Insufficient documentation

## 2013-07-26 DIAGNOSIS — I2589 Other forms of chronic ischemic heart disease: Secondary | ICD-10-CM | POA: Insufficient documentation

## 2013-07-26 DIAGNOSIS — Z7902 Long term (current) use of antithrombotics/antiplatelets: Secondary | ICD-10-CM | POA: Insufficient documentation

## 2013-07-27 ENCOUNTER — Encounter (INDEPENDENT_AMBULATORY_CARE_PROVIDER_SITE_OTHER): Payer: Medicaid Other | Admitting: *Deleted

## 2013-07-27 ENCOUNTER — Ambulatory Visit (INDEPENDENT_AMBULATORY_CARE_PROVIDER_SITE_OTHER): Payer: Medicaid Other | Admitting: Vascular Surgery

## 2013-07-27 ENCOUNTER — Encounter: Payer: Self-pay | Admitting: Vascular Surgery

## 2013-07-27 VITALS — BP 118/80 | HR 69 | Resp 18 | Ht 74.0 in | Wt 157.1 lb

## 2013-07-27 DIAGNOSIS — I739 Peripheral vascular disease, unspecified: Secondary | ICD-10-CM

## 2013-07-27 DIAGNOSIS — Z48812 Encounter for surgical aftercare following surgery on the circulatory system: Secondary | ICD-10-CM

## 2013-07-27 NOTE — Progress Notes (Signed)
The patient presents today for followup of his lower extremity arterial insufficiency. 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 He had a femoral to femoral bypass after angioplasty of native iliac stenosis. This had Kraven Calk failure and he he then underwent aortobifemoral bypass for occlusive disease in December of 2012. He has done well since that time. He recently was admitted to the hospital with a non-ST MI he is on cardiac rehabilitation and did not require any interventions. He does have stable Claudication and reports an occasional achiness of his right foot. He is walking well with the claudication and has no tissue loss. He has been concerned because someone at the hospital apparently said that he had a aneurysm requiring repair I explained that this is the same operation for treatment of aneurysmal disease and occlusive disease in that he did not have any history of aneurysm.  Past Medical History  Diagnosis Date  . Diabetes mellitus   . Hyperlipidemia   . Hypertension   . Arthritis   . Leg pain   . Seizure disorder   . Peripheral vascular disease     s/p prior Ao-Bifem BPG  . 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 Seizures     last seizure "years ago"  . Coronary artery disease     a. NSTEMI 7/14=> LHC 06/07/13: Proximal LAD 30%, ostial D1 30%, AV circumflex 80%, then occluded, ostial OM2 occluded, left to left collaterals, mid RCA 95%, inferior HK, EF 60%. PCI: Promus DES to the mid RCA.  No intervention was recommended for the CTO circumflex.  . Chronic kidney disease     hx UTI, and kidney stones  . Ischemic cardiomyopathy     a. Echocardiogram 06/06/13: EF 35-40%, inferior AK, mild MR.    History  Substance Use Topics  . Smoking status: Current Every Day Smoker -- 1.00 packs/day for 44 years    Types: Cigarettes   . Smokeless tobacco: Not on file  . Alcohol Use: No     Comment: alcohol abuse in the past    Family History  Problem Relation Age of Onset  . Hypertension Mother   . Heart attack Mother   . Diabetes Mother   . Aneurysm Father   . Heart attack Sister   . Heart disease Brother     heart transplant    Allergies  Allergen Reactions  . Crestor [Rosuvastatin] Other (See Comments)    Malaise & muscle weakness  . Lipitor [Atorvastatin] Other (See Comments)    Malaise & muscle weakness  . Penicillins Other (See Comments)    Unknown  . Zetia [Ezetimibe] Swelling    Current outpatient prescriptions:aspirin 81 MG EC tablet, Take 1 tablet (81 mg total) by mouth daily., Disp: , Rfl: ;  clopidogrel (PLAVIX) 75 MG tablet, Take 1 tablet (75 mg total) by mouth daily., Disp: 30 tablet, Rfl: 11;  glipiZIDE (GLUCOTROL) 10 MG tablet, Take 10 mg by mouth 2 (two) times daily before a meal.  , Disp: , Rfl:  nitroGLYCERIN (NITROSTAT) 0.4 MG SL tablet, Place 1 tablet (0.4 mg total) under the tongue every 5 (five) minutes as needed for chest pain., Disp: 25 tablet, Rfl: 3;  oxyCODONE (OXYCONTIN) 10 MG 12 hr tablet, Take 10 mg by mouth 3 (three) times daily as needed for pain., Disp: , Rfl: ;  phenytoin (DILANTIN) 100  MG ER capsule, Take 200-300 mg by mouth 2 (two) times daily. Take 200 mg in the morning & 300 mg in the evening., Disp: , Rfl:  zolpidem (AMBIEN) 5 MG tablet, Take 5 mg by mouth at bedtime as needed. For sleep, Disp: , Rfl:   BP 118/80  Pulse 69  Resp 18  Ht 6\' 2"  (1.88 m)  Wt 157 lb 1.6 oz (71.26 kg)  BMI 20.16 kg/m2  Body mass index is 20.16 kg/(m^2).       Physical exam well-developed well-nourished thin gentleman in no acute distress Radial pulses are 2+ bilaterally Respirations are nonlabored Abdomen soft nontender he has a well-healed midline incision with no evidence of hernia 2+ femoral graft pulses Absent popliteal and distal pulses Skin without ulcers or rashes and no  tissue loss on his feet  Noninvasive vascular today with reviewed by myself and discussed this with the patient. This does show an ankle arm index of 0.6 bilaterally which is unchanged from his prior studies  Impression and plan stable status post aortobifemoral bypass grafting for occlusive disease December 2012. None superficial femoral occlusive disease with stable bilateral lower extremity claudication. He was relieved with this discussion will continue his walking program and will see [place] on an as-needed basis

## 2013-07-28 ENCOUNTER — Encounter (HOSPITAL_COMMUNITY): Payer: Medicaid Other

## 2013-07-30 ENCOUNTER — Encounter (HOSPITAL_COMMUNITY)
Admission: RE | Admit: 2013-07-30 | Discharge: 2013-07-30 | Disposition: A | Discharge status: Home / Self Care | Payer: Medicaid Other | Source: Ambulatory Visit | Attending: Cardiovascular Disease | Admitting: Cardiovascular Disease

## 2013-08-02 ENCOUNTER — Encounter (HOSPITAL_COMMUNITY)
Admission: RE | Admit: 2013-08-02 | Discharge: 2013-08-02 | Disposition: A | Discharge status: Home / Self Care | Payer: Medicaid Other | Source: Ambulatory Visit | Attending: Cardiovascular Disease | Admitting: Cardiovascular Disease

## 2013-08-04 ENCOUNTER — Encounter: Payer: Self-pay | Admitting: Cardiovascular Disease

## 2013-08-04 ENCOUNTER — Encounter (HOSPITAL_COMMUNITY)
Admission: RE | Admit: 2013-08-04 | Discharge: 2013-08-04 | Disposition: A | Discharge status: Home / Self Care | Payer: Medicaid Other | Source: Ambulatory Visit | Attending: Cardiovascular Disease | Admitting: Cardiovascular Disease

## 2013-08-04 ENCOUNTER — Ambulatory Visit (INDEPENDENT_AMBULATORY_CARE_PROVIDER_SITE_OTHER): Payer: Medicaid Other | Admitting: Cardiovascular Disease

## 2013-08-04 ENCOUNTER — Other Ambulatory Visit: Payer: Medicaid Other

## 2013-08-04 VITALS — BP 148/80 | HR 61 | Ht 75.0 in | Wt 159.0 lb

## 2013-08-04 DIAGNOSIS — I2589 Other forms of chronic ischemic heart disease: Secondary | ICD-10-CM

## 2013-08-04 NOTE — Patient Instructions (Addendum)
Your physician has requested that you have an echocardiogram. Echocardiography is a painless test that uses sound waves to create images of your heart. It provides your doctor with information about the size and shape of your heart and how well your heart's chambers and valves are working. This procedure takes approximately one hour. There are no restrictions for this procedure.   Your physician wants you to follow-up in: 6 months. You will receive a reminder letter in the mail two months in advance. If you don't receive a letter, please call our office to schedule the follow-up appointment.  Your physician recommends that you continue on your current medications as directed. Please refer to the Current Medication list given to you today.   

## 2013-08-04 NOTE — Progress Notes (Signed)
HPI:   62 year old gentleman presenting for followup evaluation. The patient was hospitalized in July 2014 with non-ST elevation infarction. He was noted to have moderately severe left ventricular dysfunction. He has known peripheral arterial disease with history of aortobifemoral bypass grafting. He underwent cardiac catheterization demonstrating multivessel coronary artery disease. He has a chronic occlusion of the left circumflex. He had severe stenosis of the mid right coronary artery and this was treated with stenting. His left ventricular ejection fraction by cath was 60%.  The patient is doing fairly well. He unfortunately continues to smoke and does not think he will be able to quit. He has mild shortness of breath, but improved from before his PCI. He's had some chest and neck pain, but this has not been similar to the pain he experienced with his MI. He denies edema, palpitations, orthopnea, or PND.  Outpatient Encounter Prescriptions as of 08/04/2013  Medication Sig Dispense Refill  . aspirin 81 MG EC tablet Take 1 tablet (81 mg total) by mouth daily.      . clopidogrel (PLAVIX) 75 MG tablet Take 1 tablet (75 mg total) by mouth daily.  30 tablet  11  . glipiZIDE (GLUCOTROL) 10 MG tablet Take 10 mg by mouth 2 (two) times daily before a meal.        . nitroGLYCERIN (NITROSTAT) 0.4 MG SL tablet Place 1 tablet (0.4 mg total) under the tongue every 5 (five) minutes as needed for chest pain.  25 tablet  3  . oxyCODONE (OXYCONTIN) 10 MG 12 hr tablet Take 10 mg by mouth 3 (three) times daily as needed for pain.      . phenytoin (DILANTIN) 100 MG ER capsule Take 200-300 mg by mouth 2 (two) times daily. Take 200 mg in the morning & 300 mg in the evening.      . zolpidem (AMBIEN) 5 MG tablet Take 5 mg by mouth at bedtime as needed. For sleep       No facility-administered encounter medications on file as of 08/04/2013.    Allergies  Allergen Reactions  . Crestor [Rosuvastatin] Other (See  Comments)    Malaise & muscle weakness  . Lipitor [Atorvastatin] Other (See Comments)    Malaise & muscle weakness  . Penicillins Other (See Comments)    Unknown  . Zetia [Ezetimibe] Swelling    Past Medical History  Diagnosis Date  . Diabetes mellitus   . Hyperlipidemia   . Hypertension   . Arthritis   . Leg pain   . Seizure disorder   . Peripheral vascular disease     s/p prior Ao-Bifem BPG  . 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 Seizures     last seizure "years ago"  . Coronary artery disease     a. NSTEMI 7/14=> LHC 06/07/13: Proximal LAD 30%, ostial D1 30%, AV circumflex 80%, then occluded, ostial OM2 occluded, left to left collaterals, mid RCA 95%, inferior HK, EF 60%. PCI: Promus DES to the mid RCA.  No intervention was recommended for the CTO circumflex.  . Chronic kidney disease     hx UTI, and kidney stones  . Ischemic cardiomyopathy     a. Echocardiogram 06/06/13: EF 35-40%, inferior AK, mild MR.    ROS: Negative except as per HPI  BP 148/80  Pulse 61  Ht 6\' 3"  (1.905 m)  Wt 159 lb (72.122 kg)  BMI 19.87 kg/m2  SpO2 98%  PHYSICAL EXAM: Pt is alert and oriented, NAD HEENT: normal Neck: JVP - normal, carotids 2+= without bruits Lungs: CTA bilaterally CV: RRR without murmur or gallop Abd: soft, NT, Positive BS, no hepatomegaly Ext: no C/C/E, distal pulses intact and equal Skin: warm/dry no rash  ASSESSMENT AND PLAN: 1. Coronary artery disease, native vessel. The patient is stable without anginal symptoms. I would like him to continue his current medical program without changes. He was intolerant to statin drugs. He's had marked bradycardia and was not started on a beta blocker because of that.  2. Ischemic cardiomyopathy. There is discrepant data between his echocardiogram and cath. Now he has undergone revascularization, I would like him to have a repeat echocardiogram. If he has significant LV dysfunction  this will influence his medical therapy.  3. Tobacco abuse. Cessation counseling done. I suspect he will not be able to quit  4. Peripheral arterial disease. Followed by Dr. Arbie Cookey. He is status post aortobifemoral bypass grafting.  Tonny Bollman 08/04/2013 10:26 AM

## 2013-08-06 ENCOUNTER — Encounter (HOSPITAL_COMMUNITY)
Admission: RE | Admit: 2013-08-06 | Discharge: 2013-08-06 | Disposition: A | Discharge status: Home / Self Care | Payer: Medicaid Other | Source: Ambulatory Visit | Attending: Cardiovascular Disease | Admitting: Cardiovascular Disease

## 2013-08-09 ENCOUNTER — Encounter (HOSPITAL_COMMUNITY)
Admission: RE | Admit: 2013-08-09 | Discharge: 2013-08-09 | Disposition: A | Discharge status: Home / Self Care | Payer: Medicaid Other | Source: Ambulatory Visit | Attending: Cardiovascular Disease | Admitting: Cardiovascular Disease

## 2013-08-11 ENCOUNTER — Encounter (HOSPITAL_COMMUNITY)
Admission: RE | Admit: 2013-08-11 | Discharge: 2013-08-11 | Disposition: A | Discharge status: Home / Self Care | Payer: Medicaid Other | Source: Ambulatory Visit | Attending: Cardiovascular Disease | Admitting: Cardiovascular Disease

## 2013-08-13 ENCOUNTER — Encounter (HOSPITAL_COMMUNITY)
Admission: RE | Admit: 2013-08-13 | Discharge: 2013-08-13 | Disposition: A | Discharge status: Home / Self Care | Payer: Medicaid Other | Source: Ambulatory Visit | Attending: Cardiovascular Disease | Admitting: Cardiovascular Disease

## 2013-08-16 ENCOUNTER — Encounter (HOSPITAL_COMMUNITY): Payer: Medicaid Other

## 2013-08-18 ENCOUNTER — Encounter (HOSPITAL_COMMUNITY)
Admission: RE | Admit: 2013-08-18 | Discharge: 2013-08-18 | Disposition: A | Discharge status: Home / Self Care | Payer: Medicaid Other | Source: Ambulatory Visit | Attending: Cardiovascular Disease | Admitting: Cardiovascular Disease

## 2013-08-18 ENCOUNTER — Ambulatory Visit (HOSPITAL_COMMUNITY): Payer: Medicaid Other | Attending: Cardiovascular Disease | Admitting: Radiology

## 2013-08-18 DIAGNOSIS — I079 Rheumatic tricuspid valve disease, unspecified: Secondary | ICD-10-CM | POA: Insufficient documentation

## 2013-08-18 DIAGNOSIS — I059 Rheumatic mitral valve disease, unspecified: Secondary | ICD-10-CM | POA: Insufficient documentation

## 2013-08-18 DIAGNOSIS — I2510 Atherosclerotic heart disease of native coronary artery without angina pectoris: Secondary | ICD-10-CM | POA: Insufficient documentation

## 2013-08-18 DIAGNOSIS — I2589 Other forms of chronic ischemic heart disease: Secondary | ICD-10-CM

## 2013-08-18 NOTE — Progress Notes (Signed)
Echocardiogram performed.  

## 2013-08-20 ENCOUNTER — Encounter (HOSPITAL_COMMUNITY)
Admission: RE | Admit: 2013-08-20 | Discharge: 2013-08-20 | Disposition: A | Discharge status: Home / Self Care | Payer: Medicaid Other | Source: Ambulatory Visit | Attending: Cardiovascular Disease | Admitting: Cardiovascular Disease

## 2013-08-23 ENCOUNTER — Encounter (HOSPITAL_COMMUNITY): Payer: Medicaid Other

## 2013-08-25 ENCOUNTER — Encounter (HOSPITAL_COMMUNITY)
Admission: RE | Admit: 2013-08-25 | Discharge: 2013-08-25 | Disposition: A | Discharge status: Home / Self Care | Payer: Medicaid Other | Source: Ambulatory Visit | Attending: Cardiovascular Disease | Admitting: Cardiovascular Disease

## 2013-08-25 DIAGNOSIS — I2589 Other forms of chronic ischemic heart disease: Secondary | ICD-10-CM | POA: Insufficient documentation

## 2013-08-25 DIAGNOSIS — Z7982 Long term (current) use of aspirin: Secondary | ICD-10-CM | POA: Insufficient documentation

## 2013-08-25 DIAGNOSIS — Z7902 Long term (current) use of antithrombotics/antiplatelets: Secondary | ICD-10-CM | POA: Insufficient documentation

## 2013-08-25 DIAGNOSIS — I214 Non-ST elevation (NSTEMI) myocardial infarction: Secondary | ICD-10-CM | POA: Insufficient documentation

## 2013-08-25 DIAGNOSIS — Z8249 Family history of ischemic heart disease and other diseases of the circulatory system: Secondary | ICD-10-CM | POA: Insufficient documentation

## 2013-08-25 DIAGNOSIS — I2510 Atherosclerotic heart disease of native coronary artery without angina pectoris: Secondary | ICD-10-CM | POA: Insufficient documentation

## 2013-08-25 DIAGNOSIS — Z5189 Encounter for other specified aftercare: Secondary | ICD-10-CM | POA: Insufficient documentation

## 2013-08-25 DIAGNOSIS — I498 Other specified cardiac arrhythmias: Secondary | ICD-10-CM | POA: Insufficient documentation

## 2013-08-27 ENCOUNTER — Encounter (HOSPITAL_COMMUNITY): Payer: Medicaid Other

## 2013-08-30 ENCOUNTER — Encounter (HOSPITAL_COMMUNITY): Payer: Medicaid Other

## 2013-09-01 ENCOUNTER — Encounter (HOSPITAL_COMMUNITY)
Admission: RE | Admit: 2013-09-01 | Discharge: 2013-09-01 | Disposition: A | Discharge status: Home / Self Care | Payer: Medicaid Other | Source: Ambulatory Visit | Attending: Cardiovascular Disease | Admitting: Cardiovascular Disease

## 2013-09-03 ENCOUNTER — Encounter (HOSPITAL_COMMUNITY): Payer: Medicaid Other

## 2013-09-06 ENCOUNTER — Encounter (HOSPITAL_COMMUNITY): Payer: Medicaid Other

## 2013-09-08 ENCOUNTER — Encounter (HOSPITAL_COMMUNITY): Payer: Medicaid Other

## 2013-09-10 ENCOUNTER — Encounter (HOSPITAL_COMMUNITY): Payer: Medicaid Other

## 2013-09-13 ENCOUNTER — Encounter (HOSPITAL_COMMUNITY): Payer: Medicaid Other

## 2013-09-15 ENCOUNTER — Encounter (HOSPITAL_COMMUNITY): Payer: Medicaid Other

## 2013-09-16 ENCOUNTER — Telehealth (HOSPITAL_COMMUNITY): Payer: Self-pay | Admitting: *Deleted

## 2013-09-16 NOTE — Telephone Encounter (Signed)
Pt absent several exercise sessions.  Message left regarding absence and inquire return date to rehab.

## 2013-09-17 ENCOUNTER — Encounter (HOSPITAL_COMMUNITY)
Admission: RE | Admit: 2013-09-17 | Payer: Medicaid Other | Source: Ambulatory Visit | Attending: Cardiology | Admitting: Cardiology

## 2013-09-20 ENCOUNTER — Encounter (HOSPITAL_COMMUNITY): Payer: Medicaid Other

## 2013-09-22 ENCOUNTER — Encounter (HOSPITAL_COMMUNITY): Payer: Medicaid Other

## 2013-09-24 ENCOUNTER — Encounter (HOSPITAL_COMMUNITY): Payer: Medicaid Other

## 2013-09-27 ENCOUNTER — Encounter (HOSPITAL_COMMUNITY): Payer: Medicaid Other

## 2013-09-29 ENCOUNTER — Telehealth (HOSPITAL_COMMUNITY): Payer: Self-pay | Admitting: *Deleted

## 2013-09-29 ENCOUNTER — Encounter (HOSPITAL_COMMUNITY)
Admission: RE | Admit: 2013-09-29 | Payer: Medicaid Other | Source: Ambulatory Visit | Attending: Cardiology | Admitting: Cardiology

## 2013-09-29 NOTE — Telephone Encounter (Signed)
Pt returned message left 1 week prior. Pt informed that due to medicaid guidelines his last day to participate in cardiac rehab will be this Friday November 7

## 2013-10-01 ENCOUNTER — Encounter (HOSPITAL_COMMUNITY)
Admission: RE | Admit: 2013-10-01 | Payer: Medicaid Other | Source: Ambulatory Visit | Attending: Cardiology | Admitting: Cardiology

## 2013-10-22 ENCOUNTER — Telehealth: Payer: Self-pay | Admitting: Cardiovascular Disease

## 2013-10-22 NOTE — Telephone Encounter (Signed)
New Problem\    Pt has no energy, head is stuffy SOB , pt took a nytroglserin last night.    Pt is hot, not feeling well at all.

## 2013-10-22 NOTE — Telephone Encounter (Signed)
SPOKE WITH  PT HAD  VERY LONG CONVERSATION  PT  IS COMPLAINING  WITH   STUFFY NOSE,  BODY ACHES, NO ENERGY , B/P  106/64 AND  HEART RATE 64  AND  ALSO  SOME  SOB   PT  HAD  TAKEN  NTG  THIS  AM   DISCUSSED WITH PT  WHAT  KIND  OF  SYMPTOMS  HAD  PRIOR TO  STENT  PLACEMENT    C/O  JAW  AND  NECK PAIN  PER PT IS NOT HAVING  THOSE SYMPTOMS  ENCOURAGED  TO  INCREASE FLUID INTAKE AND  REST   WILL CALL  BACK IF  DEVELOPS  CARDIAC  SYMPTOMS   WILL FORWARD TO DR Excell Seltzer FOR  REVIEW .Zack Seal

## 2013-10-22 NOTE — Telephone Encounter (Signed)
Sounds like noncardiac issue. Should call PCP if symptoms persist. thx

## 2013-10-22 NOTE — Telephone Encounter (Signed)
LEFT  VOICE MAIL WITH  DR COOPER'S  RECOMMENDATIONS./CY

## 2013-10-23 ENCOUNTER — Emergency Department (HOSPITAL_COMMUNITY): Payer: Medicaid Other

## 2013-10-23 ENCOUNTER — Encounter (HOSPITAL_COMMUNITY): Payer: Self-pay | Admitting: Emergency Medicine

## 2013-10-23 ENCOUNTER — Observation Stay (HOSPITAL_COMMUNITY)
Admission: EM | Admit: 2013-10-23 | Discharge: 2013-10-24 | Disposition: A | Discharge status: Home / Self Care | Payer: Medicaid Other | Attending: Internal Medicine | Admitting: Internal Medicine

## 2013-10-23 DIAGNOSIS — I498 Other specified cardiac arrhythmias: Secondary | ICD-10-CM | POA: Insufficient documentation

## 2013-10-23 DIAGNOSIS — M791 Myalgia, unspecified site: Secondary | ICD-10-CM

## 2013-10-23 DIAGNOSIS — N189 Chronic kidney disease, unspecified: Secondary | ICD-10-CM | POA: Insufficient documentation

## 2013-10-23 DIAGNOSIS — R0602 Shortness of breath: Secondary | ICD-10-CM | POA: Insufficient documentation

## 2013-10-23 DIAGNOSIS — M310 Hypersensitivity angiitis: Secondary | ICD-10-CM | POA: Insufficient documentation

## 2013-10-23 DIAGNOSIS — M129 Arthropathy, unspecified: Secondary | ICD-10-CM | POA: Insufficient documentation

## 2013-10-23 DIAGNOSIS — I739 Peripheral vascular disease, unspecified: Secondary | ICD-10-CM

## 2013-10-23 DIAGNOSIS — R5381 Other malaise: Secondary | ICD-10-CM | POA: Insufficient documentation

## 2013-10-23 DIAGNOSIS — C9000 Multiple myeloma not having achieved remission: Secondary | ICD-10-CM | POA: Insufficient documentation

## 2013-10-23 DIAGNOSIS — G40909 Epilepsy, unspecified, not intractable, without status epilepticus: Secondary | ICD-10-CM

## 2013-10-23 DIAGNOSIS — I714 Abdominal aortic aneurysm, without rupture, unspecified: Secondary | ICD-10-CM

## 2013-10-23 DIAGNOSIS — E237 Disorder of pituitary gland, unspecified: Secondary | ICD-10-CM | POA: Insufficient documentation

## 2013-10-23 DIAGNOSIS — E119 Type 2 diabetes mellitus without complications: Secondary | ICD-10-CM

## 2013-10-23 DIAGNOSIS — I2510 Atherosclerotic heart disease of native coronary artery without angina pectoris: Secondary | ICD-10-CM

## 2013-10-23 DIAGNOSIS — IMO0001 Reserved for inherently not codable concepts without codable children: Principal | ICD-10-CM | POA: Insufficient documentation

## 2013-10-23 DIAGNOSIS — Z880 Allergy status to penicillin: Secondary | ICD-10-CM | POA: Insufficient documentation

## 2013-10-23 DIAGNOSIS — I129 Hypertensive chronic kidney disease with stage 1 through stage 4 chronic kidney disease, or unspecified chronic kidney disease: Secondary | ICD-10-CM | POA: Insufficient documentation

## 2013-10-23 DIAGNOSIS — R531 Weakness: Secondary | ICD-10-CM

## 2013-10-23 DIAGNOSIS — I214 Non-ST elevation (NSTEMI) myocardial infarction: Secondary | ICD-10-CM

## 2013-10-23 DIAGNOSIS — R0789 Other chest pain: Secondary | ICD-10-CM

## 2013-10-23 DIAGNOSIS — D571 Sickle-cell disease without crisis: Secondary | ICD-10-CM | POA: Insufficient documentation

## 2013-10-23 DIAGNOSIS — R42 Dizziness and giddiness: Secondary | ICD-10-CM

## 2013-10-23 DIAGNOSIS — Z79899 Other long term (current) drug therapy: Secondary | ICD-10-CM | POA: Insufficient documentation

## 2013-10-23 DIAGNOSIS — E785 Hyperlipidemia, unspecified: Secondary | ICD-10-CM

## 2013-10-23 DIAGNOSIS — Z720 Tobacco use: Secondary | ICD-10-CM

## 2013-10-23 DIAGNOSIS — Z888 Allergy status to other drugs, medicaments and biological substances status: Secondary | ICD-10-CM | POA: Insufficient documentation

## 2013-10-23 DIAGNOSIS — F17200 Nicotine dependence, unspecified, uncomplicated: Secondary | ICD-10-CM | POA: Insufficient documentation

## 2013-10-23 DIAGNOSIS — Z7982 Long term (current) use of aspirin: Secondary | ICD-10-CM | POA: Insufficient documentation

## 2013-10-23 DIAGNOSIS — R0600 Dyspnea, unspecified: Secondary | ICD-10-CM

## 2013-10-23 DIAGNOSIS — F329 Major depressive disorder, single episode, unspecified: Secondary | ICD-10-CM

## 2013-10-23 DIAGNOSIS — R001 Bradycardia, unspecified: Secondary | ICD-10-CM

## 2013-10-23 DIAGNOSIS — I70219 Atherosclerosis of native arteries of extremities with intermittent claudication, unspecified extremity: Secondary | ICD-10-CM

## 2013-10-23 DIAGNOSIS — F32A Depression, unspecified: Secondary | ICD-10-CM

## 2013-10-23 DIAGNOSIS — R58 Hemorrhage, not elsewhere classified: Secondary | ICD-10-CM

## 2013-10-23 LAB — APTT: aPTT: 32 seconds (ref 24–37)

## 2013-10-23 LAB — CBC
HCT: 40.7 % (ref 39.0–52.0)
Hemoglobin: 13.9 g/dL (ref 13.0–17.0)
MCV: 91.5 fL (ref 78.0–100.0)
RDW: 13.6 % (ref 11.5–15.5)
WBC: 3.9 10*3/uL — ABNORMAL LOW (ref 4.0–10.5)

## 2013-10-23 LAB — URINALYSIS, ROUTINE W REFLEX MICROSCOPIC
Bilirubin Urine: NEGATIVE
Nitrite: NEGATIVE
Protein, ur: NEGATIVE mg/dL
Specific Gravity, Urine: 1.008 (ref 1.005–1.030)
Urobilinogen, UA: 0.2 mg/dL (ref 0.0–1.0)

## 2013-10-23 LAB — COMPREHENSIVE METABOLIC PANEL
Albumin: 3.8 g/dL (ref 3.5–5.2)
Alkaline Phosphatase: 69 U/L (ref 39–117)
BUN: 11 mg/dL (ref 6–23)
Calcium: 9.4 mg/dL (ref 8.4–10.5)
GFR calc Af Amer: >90 mL/min (ref >90)
Glucose, Bld: 90 mg/dL (ref 70–99)
Potassium: 3.9 mEq/L (ref 3.5–5.1)
Sodium: 136 mEq/L (ref 135–145)
Total Protein: 6.7 g/dL (ref 6.0–8.3)

## 2013-10-23 LAB — LIPASE, BLOOD: Lipase: 40 U/L (ref 11–59)

## 2013-10-23 LAB — INFLUENZA PANEL BY PCR (TYPE A & B)
H1N1 flu by pcr: NOT DETECTED
Influenza A By PCR: NEGATIVE
Influenza B By PCR: NEGATIVE

## 2013-10-23 LAB — TROPONIN I
Troponin I: <0.3 ng/mL (ref <0.30)
Troponin I: <0.3 ng/mL (ref <0.30)

## 2013-10-23 LAB — PROTIME-INR
INR: 1.03 (ref 0.00–1.49)
Prothrombin Time: 13.3 seconds (ref 11.6–15.2)

## 2013-10-23 MED ORDER — ACETAMINOPHEN 650 MG RE SUPP: 650.0000 mg | Freq: Four times a day (QID) | RECTAL | Status: DC | PRN

## 2013-10-23 MED ORDER — HYDROMORPHONE HCL PF 1 MG/ML IJ SOLN: 0.5000 mg | INTRAMUSCULAR | Status: DC | PRN

## 2013-10-23 MED ORDER — SODIUM CHLORIDE 0.9 % IV SOLN: INTRAVENOUS | Status: DC

## 2013-10-23 MED ORDER — ONDANSETRON HCL 4 MG/2ML IJ SOLN: 4.0000 mg | Freq: Four times a day (QID) | INTRAMUSCULAR | Status: DC | PRN

## 2013-10-23 MED ORDER — MORPHINE SULFATE 4 MG/ML IJ SOLN
4.0000 mg | Freq: Once | INTRAMUSCULAR | Status: AC
  Administered 2013-10-23: 4 mg via INTRAVENOUS
  Filled 2013-10-23: qty 1

## 2013-10-23 MED ORDER — PHENYTOIN SODIUM EXTENDED 100 MG PO CAPS
200.0000 mg | ORAL_CAPSULE | Freq: Two times a day (BID) | ORAL | Status: DC
  Filled 2013-10-23: qty 3

## 2013-10-23 MED ORDER — ACETAMINOPHEN 325 MG PO TABS: 650.0000 mg | ORAL_TABLET | Freq: Four times a day (QID) | ORAL | Status: DC | PRN

## 2013-10-23 MED ORDER — INSULIN ASPART 100 UNIT/ML ~~LOC~~ SOLN
0.0000 [IU] | Freq: Three times a day (TID) | SUBCUTANEOUS | Status: DC
  Administered 2013-10-24: 2 [IU] via SUBCUTANEOUS

## 2013-10-23 MED ORDER — HYDROCODONE-ACETAMINOPHEN 5-325 MG PO TABS
2.0000 | ORAL_TABLET | Freq: Once | ORAL | Status: AC
  Administered 2013-10-23: 2 via ORAL
  Filled 2013-10-23: qty 2

## 2013-10-23 MED ORDER — ASPIRIN EC 325 MG PO TBEC
325.0000 mg | DELAYED_RELEASE_TABLET | Freq: Every day | ORAL | Status: DC
  Administered 2013-10-24: 325 mg via ORAL
  Filled 2013-10-23: qty 1

## 2013-10-23 MED ORDER — OXYCODONE HCL ER 10 MG PO T12A
10.0000 mg | EXTENDED_RELEASE_TABLET | Freq: Two times a day (BID) | ORAL | Status: DC | PRN
  Administered 2013-10-23: 10 mg via ORAL
  Filled 2013-10-23: qty 1

## 2013-10-23 MED ORDER — INSULIN ASPART 100 UNIT/ML ~~LOC~~ SOLN: 0.0000 [IU] | Freq: Every day | SUBCUTANEOUS | Status: DC

## 2013-10-23 MED ORDER — ZOLPIDEM TARTRATE 5 MG PO TABS: 5.0000 mg | ORAL_TABLET | Freq: Every evening | ORAL | Status: DC | PRN

## 2013-10-23 MED ORDER — ENOXAPARIN SODIUM 40 MG/0.4ML ~~LOC~~ SOLN
40.0000 mg | Freq: Every day | SUBCUTANEOUS | Status: DC
  Administered 2013-10-23: 40 mg via SUBCUTANEOUS
  Filled 2013-10-23 (×2): qty 0.4

## 2013-10-23 MED ORDER — ONDANSETRON HCL 4 MG/2ML IJ SOLN: 4.0000 mg | Freq: Three times a day (TID) | INTRAMUSCULAR | Status: AC | PRN

## 2013-10-23 MED ORDER — OXYCODONE HCL 5 MG PO TABS
5.0000 mg | ORAL_TABLET | ORAL | Status: DC | PRN
  Administered 2013-10-24 (×3): 5 mg via ORAL
  Filled 2013-10-23 (×3): qty 1

## 2013-10-23 MED ORDER — SODIUM CHLORIDE 0.9 % IV BOLUS (SEPSIS)
1000.0000 mL | Freq: Once | INTRAVENOUS | Status: AC
  Administered 2013-10-23: 1000 mL via INTRAVENOUS

## 2013-10-23 MED ORDER — ALUM & MAG HYDROXIDE-SIMETH 200-200-20 MG/5ML PO SUSP: 30.0000 mL | Freq: Four times a day (QID) | ORAL | Status: DC | PRN

## 2013-10-23 MED ORDER — ASPIRIN 81 MG PO CHEW
324.0000 mg | CHEWABLE_TABLET | Freq: Once | ORAL | Status: AC
  Administered 2013-10-23: 324 mg via ORAL
  Filled 2013-10-23: qty 4

## 2013-10-23 MED ORDER — PHENYTOIN SODIUM EXTENDED 100 MG PO CAPS
200.0000 mg | ORAL_CAPSULE | Freq: Every day | ORAL | Status: DC
  Administered 2013-10-24: 200 mg via ORAL
  Filled 2013-10-23: qty 2

## 2013-10-23 MED ORDER — ONDANSETRON HCL 4 MG PO TABS: 4.0000 mg | ORAL_TABLET | Freq: Four times a day (QID) | ORAL | Status: DC | PRN

## 2013-10-23 MED ORDER — HYDROMORPHONE HCL PF 1 MG/ML IJ SOLN: 1.0000 mg | INTRAMUSCULAR | Status: DC | PRN

## 2013-10-23 MED ORDER — GLIPIZIDE 10 MG PO TABS
10.0000 mg | ORAL_TABLET | Freq: Two times a day (BID) | ORAL | Status: DC
  Administered 2013-10-24: 10 mg via ORAL
  Filled 2013-10-23 (×3): qty 1

## 2013-10-23 MED ORDER — CLOPIDOGREL BISULFATE 75 MG PO TABS
75.0000 mg | ORAL_TABLET | Freq: Every day | ORAL | Status: DC
  Administered 2013-10-24: 75 mg via ORAL
  Filled 2013-10-23 (×2): qty 1

## 2013-10-23 MED ORDER — PHENYTOIN SODIUM EXTENDED 100 MG PO CAPS
300.0000 mg | ORAL_CAPSULE | Freq: Every day | ORAL | Status: DC
  Administered 2013-10-23: 300 mg via ORAL
  Filled 2013-10-23 (×2): qty 3

## 2013-10-23 MED ORDER — SODIUM CHLORIDE 0.9 % IJ SOLN
3.0000 mL | Freq: Two times a day (BID) | INTRAMUSCULAR | Status: DC
  Administered 2013-10-23: 3 mL via INTRAVENOUS

## 2013-10-23 MED ORDER — NITROGLYCERIN 0.4 MG SL SUBL: 0.4000 mg | SUBLINGUAL_TABLET | SUBLINGUAL | Status: DC | PRN

## 2013-10-23 NOTE — H&P (Addendum)
[name] History and Physical  ANTWON ROCHIN ZOX:[id number] DOB: [birth year] DOA: 10/23/2013  Referring physician:  EDP PCP: Mia Creek, MD  Specialists:   Chief Complaint:  Chest Discomfort and Weakness  HPI: [name] [name] is a 62 y.o. male who presents to the ED with complaints of chest discomfort, SOB,  and myalgias and fatigue x 3 days.  He denies having any fevers or chills or nausea vomiting or diarrhea.   He has a history of CAD S/P Stent x 1 and type II Diabetes and PVD.     He was seen in the ED and evaluated and found to have bradycardic episodes with the lowest heart rate of 38 so he was  refered for a cardiac rule out.      Review of Systems: The patient denies anorexia, fever, chills, headaches, weight loss, vision loss, diplopia, dizziness, decreased hearing, rhinitis, hoarseness, chest pain, syncope, dyspnea on exertion, peripheral edema, balance deficits, cough, hemoptysis, abdominal pain, nausea, vomiting, diarrhea, constipation, hematemesis, melena, hematochezia, severe indigestion/heartburn, dysuria, hematuria, incontinence, muscle weakness, suspicious skin lesions, transient blindness, difficulty walking, depression, unusual weight change, abnormal bleeding, enlarged lymph nodes, angioedema, and breast masses.    Past Medical History  Diagnosis Date  . Diabetes mellitus   . Hyperlipidemia   . Hypertension   . Arthritis   . Leg pain   . Seizure disorder   . Peripheral vascular disease     s/p prior Ao-Bifem BPG  . 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 Seizures     last seizure "years ago"  . Coronary artery disease     a. NSTEMI 7/14=> LHC 06/07/13: Proximal LAD 30%, ostial D1 30%, AV circumflex 80%, then occluded, ostial OM2 occluded, left to left collaterals, mid RCA 95%, inferior HK, EF 60%. PCI: Promus DES to the mid RCA.  No intervention was recommended for the CTO circumflex.  . Chronic kidney  disease     hx UTI, and kidney stones  . Ischemic cardiomyopathy     a. Echocardiogram 06/06/13: EF 35-40%, inferior AK, mild MR.    Past Surgical History  Procedure Laterality Date  . Thrombectomy      left to right fem-fem bypass  . Hernia repair    . Aortogram with iliac artery stenting  05/06/2011  . Tonsillectomy    . Aorta - bilateral femoral artery bypass graft  11/25/2011    Procedure: AORTA BIFEMORAL BYPASS GRAFT;  Surgeon: Larina Earthly, MD;  Location: [facility] OR;  Service: Vascular;  Laterality: N/A;  . Pr vein bypass graft,aorto-fem-pop  11/24/12    Prior to Admission medications   Medication Sig Start Date End Date Taking? Authorizing Provider  aspirin EC 81 MG tablet Take 81 mg by mouth daily.   Yes Historical Provider, MD  clopidogrel (PLAVIX) 75 MG tablet Take 75 mg by mouth daily with breakfast.   Yes Historical Provider, MD  glipiZIDE (GLUCOTROL) 10 MG tablet Take 10 mg by mouth 2 (two) times daily before a meal.   Yes Historical Provider, MD  nitroGLYCERIN (NITROSTAT) 0.4 MG SL tablet Place 0.4 mg under the tongue every 5 (five) minutes as needed for chest pain.   Yes Historical Provider, MD  OxyCODONE (OXYCONTIN) 10 mg T12A 12 hr tablet Take 10 mg by mouth 2 (two) times daily as needed (for pain).   Yes Historical Provider, MD  phenytoin (DILANTIN) 100 MG ER capsule Take 200-300  mg by mouth 2 (two) times daily. Take 200 mg in the morning & 300 mg in the evening.   Yes Historical Provider, MD  triamcinolone cream (KENALOG) 0.1 % Apply 1 application topically 2 (two) times daily as needed (for eczema).   Yes Historical Provider, MD  zolpidem (AMBIEN) 5 MG tablet Take 5 mg by mouth at bedtime as needed. For sleep   Yes Historical Provider, MD    Allergies  Allergen Reactions  . Crestor [Rosuvastatin] Other (See Comments)    Malaise & muscle weakness  . Lipitor [Atorvastatin] Other (See Comments)    Malaise & muscle weakness  . Penicillins Other (See Comments)    Unknown   . Zetia [Ezetimibe] Swelling    Social History:  reports that he has been smoking Cigarettes.  He has a 44 pack-year smoking history. He does not have any smokeless tobacco history on file. He reports that he uses illicit drugs (Marijuana) about once per week. He reports that he does not drink alcohol.     Family History  Problem Relation Age of Onset  . Hypertension Mother   . Heart attack Mother   . Diabetes Mother   . Aneurysm Father   . Heart attack Sister   . Heart disease Brother     heart transplant     Physical Exam:  GEN:  Pleasant Thin Well developed  62 y.o. African American  male  examined  and in no acute distress; cooperative with exam Filed Vitals:   10/23/13 1821 10/23/13 1930 10/23/13 1954 10/23/13 2127  BP: 136/83 121/70 135/85 121/69  Pulse:  57 52 50  Temp:   97.8 F (36.6 C) 97.8 F (36.6 C)  TempSrc:   Oral Oral  Resp: 16  17   SpO2: 97% 99% 99% 97%   Blood pressure 121/69, pulse 50, temperature 97.8 F (36.6 C), temperature source Oral, resp. rate 17, SpO2 97.00%. PSYCH: He is alert and oriented x4; does not appear anxious does not appear depressed; affect is normal HEENT: Normocephalic and Atraumatic, Mucous membranes pink; PERRLA; EOM intact; Fundi:  Benign;  No scleral icterus, Nares: Patent, Oropharynx: Clear, Fair Dentition, Neck:  FROM, no cervical lymphadenopathy nor thyromegaly or carotid bruit; no JVD; Breasts:: Not examined CHEST WALL: No tenderness CHEST: Normal respiration, clear to auscultation bilaterally HEART: Regular rate and rhythm; no murmurs rubs or gallops BACK: No kyphosis or scoliosis; no CVA tenderness ABDOMEN: Positive Bowel Sounds, soft non-tender; no masses, no organomegaly. Rectal Exam: Not done EXTREMITIES: No cyanosis, clubbing or edema; no ulcerations. Genitalia: not examined PULSES: 2+ and symmetric SKIN: Normal hydration no rash or ulceration CNS: Cranial nerves 2-12 grossly intact no focal neurologic deficit     Labs on Admission:  Basic Metabolic Panel:  Recent Labs Lab 10/23/13 1633  NA 136  K 3.9  CL 103  CO2 25  GLUCOSE 90  BUN 11  CREATININE 0.65  CALCIUM 9.4   Liver Function Tests:  Recent Labs Lab 10/23/13 1633  AST 14  ALT 9  ALKPHOS 69  BILITOT 0.2*  PROT 6.7  ALBUMIN 3.8    Recent Labs Lab 10/23/13 1633  LIPASE 40   No results found for this basename: AMMONIA,  in the last 168 hours CBC:  Recent Labs Lab 10/23/13 1633  WBC 3.9*  HGB 13.9  HCT 40.7  MCV 91.5  PLT 145*   Cardiac Enzymes:  Recent Labs Lab 10/23/13 1811 10/23/13 2011  TROPONINI <0.30 <0.30    BNP (last  3 results)  Recent Labs  06/05/13 2152 06/11/13 1009  PROBNP 367.5* 171.6*   CBG: No results found for this basename: GLUCAP,  in the last 168 hours  Radiological Exams on Admission: Dg Chest 2 View  10/23/2013   CLINICAL DATA:  Chest pain, shortness of breath  EXAM: CHEST  2 VIEW  COMPARISON:  07/02/2013  FINDINGS: Lungs are clear. No pleural effusion or pneumothorax.  The heart is normal in size.  Mild degenerative changes of the visualized thoracolumbar spine.  IMPRESSION: No evidence of acute cardiopulmonary disease.   Electronically Signed   By: Charline Bills M.D.   On: 10/23/2013 17:49     EKG: Independently reviewed.       Assessment/Plan Principal Problem:   Atypical chest pain Active Problems:   Bradycardia   CAD (coronary artery disease)   Tobacco abuse   Dyslipidemia   Type 2 diabetes mellitus   Seizure disorder   Anxiety and depression   Peripheral vascular disease, unspecified   Generalized weakness    1.  Atypical Chest Pain/CAD-  Admit for Observation to Telemetry Bed,  Cycle Troponins, continue Plavix and ASA,   His Cards is [place] Dr. Excell Seltzer.    2.  Bradycardia-  ? Acute versus chronic, if acute plan in #1,  If chronic  Is due to increased vagal tone.     3.  Tobacco-  Counseled.  Nicotine patch daily while inpatient.    4.   DM2-   Continue Glipizide, add SSI coverage PRN, and check HbA1c.    5.  Seizure Disorder- Continue phenytoin Rx.    6.  Anxiety and Depression- May need outpt referral for Rx.   7.  PVD-  continueASA and Plavix Rx.    8.  Generalized Weakness-  Multifactorial, Monitor.  Due to #2, and #6.    9.  DVT prophylaxis with Lovenox.      Code Status:   FULL CODE Family Communication:    No Family Present Disposition Plan:     Observation  Time spent:  29 Minutes  Ron Parker [name] Pager [phone]  If 7PM-7AM, please contact night-coverage [email or web address] Password [facility] 10/23/2013, 9:46 PM

## 2013-10-23 NOTE — ED Notes (Signed)
Pt has been feeling bad for three days and reports left sided chest pain with sob.  Pt has history of cad with stents

## 2013-10-23 NOTE — ED Provider Notes (Signed)
CSN: [id number]     Arrival date & time 10/23/13  1556 History   First MD Initiated Contact with Patient 10/23/13 1600     Chief Complaint  Patient presents with  . Chest Pain  . Shortness of Breath   (Consider location/radiation/quality/duration/timing/severity/associated sxs/prior Treatment) Patient is a 62 y.o. male presenting with chest pain and shortness of breath. The history is provided by the patient and medical records. No language interpreter was used.  Chest Pain Associated symptoms: fatigue, shortness of breath and weakness   Associated symptoms: no abdominal pain, no back pain, no cough, no diaphoresis, no fever, no headache, no nausea and not vomiting   Shortness of Breath Associated symptoms: chest pain   Associated symptoms: no abdominal pain, no cough, no diaphoresis, no fever, no headaches, no rash, no vomiting and no wheezing     AKSHITH MONCUS is a 62 y.o. male  with a hx of DM, HTN, arthritis, PVD, seizures, CAD (NSTEMI 7/14 with stents), CKD presents to the Emergency Department complaining of gradual, persistent, progressively worsening general illness with associated body aches and general weakness onset 4 days ago.  Pt reports he awoke this morning, went to the store and after returning home began to feel SOB.  Pt reports left sided, posterior, intermittent chest pain 4 days ago as well.  Associated symptoms include mild, generalized headache. Nothing makes it better and nothing makes it worse.  Pt denies fever, chills, neck pain, abd pain, N/V/D, dizziness, syncope, dysuria, hematuria.      Past Medical History  Diagnosis Date  . Diabetes mellitus   . Hyperlipidemia   . Hypertension   . Arthritis   . Leg pain   . Seizure disorder   . Peripheral vascular disease     s/p prior Ao-Bifem BPG  . 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 Seizures     last seizure "years ago"  . Coronary artery disease     a. NSTEMI  7/14=> LHC 06/07/13: Proximal LAD 30%, ostial D1 30%, AV circumflex 80%, then occluded, ostial OM2 occluded, left to left collaterals, mid RCA 95%, inferior HK, EF 60%. PCI: Promus DES to the mid RCA.  No intervention was recommended for the CTO circumflex.  . Chronic kidney disease     hx UTI, and kidney stones  . Ischemic cardiomyopathy     a. Echocardiogram 06/06/13: EF 35-40%, inferior AK, mild MR.   Past Surgical History  Procedure Laterality Date  . Thrombectomy      left to right fem-fem bypass  . Hernia repair    . Aortogram with iliac artery stenting  05/06/2011  . Tonsillectomy    . Aorta - bilateral femoral artery bypass graft  11/25/2011    Procedure: AORTA BIFEMORAL BYPASS GRAFT;  Surgeon: Larina Earthly, MD;  Location: [facility] OR;  Service: Vascular;  Laterality: N/A;  . Pr vein bypass graft,aorto-fem-pop  11/24/12   Family History  Problem Relation Age of Onset  . Hypertension Mother   . Heart attack Mother   . Diabetes Mother   . Aneurysm Father   . Heart attack Sister   . Heart disease Brother     heart transplant   History  Substance Use Topics  . 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 Smokeless tobacco: Not on file  . Alcohol Use: No  Comment: alcohol abuse in the past    Review of Systems  Constitutional: Positive for fatigue. Negative for fever, diaphoresis, appetite change and unexpected weight change.       Malaise  HENT: Negative for mouth sores.   Eyes: Negative for visual disturbance.  Respiratory: Positive for shortness of breath. Negative for cough, chest tightness and wheezing.   Cardiovascular: Positive for chest pain.  Gastrointestinal: Negative for nausea, vomiting, abdominal pain, diarrhea and constipation.  Endocrine: Negative for polydipsia, polyphagia and polyuria.  Genitourinary: Negative for dysuria, urgency, frequency and hematuria.  Musculoskeletal: Positive for myalgias. Negative  for back pain and neck stiffness.  Skin: Negative for rash.  Allergic/Immunologic: Negative for immunocompromised state.  Neurological: Positive for weakness. Negative for syncope, light-headedness and headaches.  Hematological: Does not bruise/bleed easily.  Psychiatric/Behavioral: Negative for sleep disturbance. The patient is not nervous/anxious.     Allergies  Crestor; Lipitor; Penicillins; and Zetia  Home Medications   Current Outpatient Rx  Name  Route  Sig  Dispense  Refill  . aspirin EC 81 MG tablet   Oral   Take 81 mg by mouth daily.         . clopidogrel (PLAVIX) 75 MG tablet   Oral   Take 75 mg by mouth daily with breakfast.         . glipiZIDE (GLUCOTROL) 10 MG tablet   Oral   Take 10 mg by mouth 2 (two) times daily before a meal.         . nitroGLYCERIN (NITROSTAT) 0.4 MG SL tablet   Sublingual   Place 0.4 mg under the tongue every 5 (five) minutes as needed for chest pain.         . OxyCODONE (OXYCONTIN) 10 mg T12A 12 hr tablet   Oral   Take 10 mg by mouth 2 (two) times daily as needed (for pain).         . phenytoin (DILANTIN) 100 MG ER capsule   Oral   Take 200-300 mg by mouth 2 (two) times daily. Take 200 mg in the morning & 300 mg in the evening.         . triamcinolone cream (KENALOG) 0.1 %   Topical   Apply 1 application topically 2 (two) times daily as needed (for eczema).         . zolpidem (AMBIEN) 5 MG tablet   Oral   Take 5 mg by mouth at bedtime as needed. For sleep          BP 135/85  Pulse 52  Temp(Src) 97.8 F (36.6 C) (Oral)  Resp 17  SpO2 99% Physical Exam  Nursing note and vitals reviewed. Constitutional: He is oriented to person, place, and time. He appears well-developed and well-nourished. No distress.  Awake, alert, nontoxic appearance  HENT:  Head: Normocephalic and atraumatic.  Mouth/Throat: Oropharynx is clear and moist. No oropharyngeal exudate.  Eyes: Conjunctivae are normal. No scleral icterus.   Neck: Normal range of motion. Neck supple.  Cardiovascular: Normal rate, regular rhythm, normal heart sounds and intact distal pulses.   No murmur heard. Pulmonary/Chest: Effort normal and breath sounds normal. No respiratory distress. He has no wheezes. He has no rales.  Abdominal: Soft. Bowel sounds are normal. He exhibits no distension and no mass. There is no tenderness. There is no rebound and no guarding.  Musculoskeletal: Normal range of motion. He exhibits no edema.  Lymphadenopathy:    He has no cervical adenopathy.  Neurological: He is  alert and oriented to person, place, and time. He exhibits normal muscle tone. Coordination normal.  Speech is clear and goal oriented Moves extremities without ataxia  Skin: Skin is warm and dry. He is not diaphoretic. No erythema.  Psychiatric: He has a normal mood and affect. His behavior is normal.    ED Course  Procedures (including critical care time) Labs Review Labs Reviewed  COMPREHENSIVE METABOLIC PANEL - Abnormal; Notable for the following:    Total Bilirubin 0.2 (*)    All other components within normal limits  CBC - Abnormal; Notable for the following:    WBC 3.9 (*)    Platelets 145 (*)    All other components within normal limits  PROTIME-INR  APTT  URINALYSIS, ROUTINE W REFLEX MICROSCOPIC  PHENYTOIN LEVEL, TOTAL  LIPASE, BLOOD  INFLUENZA PANEL BY PCR  TROPONIN I  TROPONIN I   Imaging Review Dg Chest 2 View  10/23/2013   CLINICAL DATA:  Chest pain, shortness of breath  EXAM: CHEST  2 VIEW  COMPARISON:  07/02/2013  FINDINGS: Lungs are clear. No pleural effusion or pneumothorax.  The heart is normal in size.  Mild degenerative changes of the visualized thoracolumbar spine.  IMPRESSION: No evidence of acute cardiopulmonary disease.   Electronically Signed   By: Charline Bills M.D.   On: 10/23/2013 17:49    EKG Interpretation    Date/Time:  Saturday October 23 2013 20:02:12 EST Ventricular Rate:  47 PR  Interval:  174 QRS Duration: 105 QT Interval:  497 QTC Calculation: 439 R Axis:   85 Text Interpretation:  Sinus bradycardia Anteroseptal infarct, old Unchanged ECG Confirmed by WARD  DO, KRISTEN ([id number]) on 10/23/2013 8:26:05 PM            MDM   1. Myalgia   2. Generalized weakness   3. Bradycardia      ARISTEO HANKERSON presents with several days of worsening general illness peaking today.  Pt with significant Hx of PVD, CAD with stents.  Will assess for ACS, viral and bacterial etiology.    8:00PM Patient with persistent myalgias and generalized weakness. He states he feels very poorly. Her initial troponin negative, influenza screen negative, UA without evidence of urinary tract infection. CMP unremarkable and CBC without leukocytosis.  Bedtime level normal and lipase normal. Chest x-ray without evidence of pneumonia.  Patient with atypical chest pain and signs and symptoms of viral URI.  Several minutes ago patient noted to become bradycardic to 37. He did not have associated chest pain shortness of breath nausea or diaphoresis with this. I have not seen the patient but below 52 but he has been persistently bradycardic after his arrival.  She now complaining of headache neck pain. Concern for possible NSTEMI;  Will consult cardiology and admit for observation.   9:00 PM Patient repeat troponin normal. Dr. Lovell Sheehan will admit, pending cardiology consult.  9:05 PM   Discussed with Dr. Katha Cabal cardiology who will consult.  Dahlia Client Shawntay Prest, PA-C 10/23/13 2105

## 2013-10-23 NOTE — ED Notes (Signed)
RN into room, patient HR dropped to 38 for approx 10 seconds and went up to 47. RN placed patient on 12 lead and shot EKG. Provider and primary RN notified.

## 2013-10-24 ENCOUNTER — Encounter (HOSPITAL_COMMUNITY): Payer: Self-pay | Admitting: *Deleted

## 2013-10-24 ENCOUNTER — Observation Stay (HOSPITAL_COMMUNITY): Payer: Medicaid Other

## 2013-10-24 DIAGNOSIS — I2510 Atherosclerotic heart disease of native coronary artery without angina pectoris: Secondary | ICD-10-CM

## 2013-10-24 DIAGNOSIS — R0789 Other chest pain: Secondary | ICD-10-CM

## 2013-10-24 DIAGNOSIS — I498 Other specified cardiac arrhythmias: Secondary | ICD-10-CM

## 2013-10-24 DIAGNOSIS — R0609 Other forms of dyspnea: Secondary | ICD-10-CM

## 2013-10-24 DIAGNOSIS — E785 Hyperlipidemia, unspecified: Secondary | ICD-10-CM

## 2013-10-24 LAB — CBC
Platelets: 151 10*3/uL (ref 150–400)
RBC: 4.23 MIL/uL (ref 4.22–5.81)
RDW: 13.7 % (ref 11.5–15.5)
WBC: 3.8 10*3/uL — ABNORMAL LOW (ref 4.0–10.5)

## 2013-10-24 LAB — BASIC METABOLIC PANEL
Calcium: 8.8 mg/dL (ref 8.4–10.5)
Creatinine, Ser: 0.68 mg/dL (ref 0.50–1.35)
GFR calc non Af Amer: >90 mL/min (ref >90)
Sodium: 141 mEq/L (ref 135–145)

## 2013-10-24 LAB — TROPONIN I
Troponin I: <0.3 ng/mL (ref <0.30)
Troponin I: <0.3 ng/mL (ref <0.30)
Troponin I: <0.3 ng/mL (ref <0.30)

## 2013-10-24 LAB — GLUCOSE, CAPILLARY: Glucose-Capillary: 52 mg/dL — ABNORMAL LOW (ref 70–99)

## 2013-10-24 LAB — D-DIMER, QUANTITATIVE: D-Dimer, Quant: 1.78 ug/mL-FEU — ABNORMAL HIGH (ref 0.00–0.48)

## 2013-10-24 LAB — HEMOGLOBIN A1C: Hgb A1c MFr Bld: 6.7 % — ABNORMAL HIGH (ref <5.7)

## 2013-10-24 MED ORDER — PROMETHAZINE HCL 12.5 MG PO TABS: 12.5000 mg | ORAL_TABLET | Freq: Four times a day (QID) | ORAL | Status: DC | PRN

## 2013-10-24 MED ORDER — OXYCODONE HCL ER 10 MG PO T12A
10.0000 mg | EXTENDED_RELEASE_TABLET | Freq: Once | ORAL | Status: AC
  Administered 2013-10-24: 10 mg via ORAL
  Filled 2013-10-24: qty 1

## 2013-10-24 MED ORDER — IOHEXOL 350 MG/ML SOLN
80.0000 mL | Freq: Once | INTRAVENOUS | Status: AC | PRN
  Administered 2013-10-24: 80 mL via INTRAVENOUS

## 2013-10-24 MED ORDER — LISINOPRIL 2.5 MG PO TABS: 2.5000 mg | ORAL_TABLET | Freq: Every day | ORAL | Status: DC

## 2013-10-24 MED ORDER — LISINOPRIL 2.5 MG PO TABS
2.5000 mg | ORAL_TABLET | Freq: Every day | ORAL | Status: DC
  Filled 2013-10-24: qty 1

## 2013-10-24 NOTE — Progress Notes (Signed)
Utilization Review Completed.[name] [name] T11/30/2014  

## 2013-10-24 NOTE — Progress Notes (Signed)
Patient ID: YUREM VINER  male  UJW:[id number]    DOB: [birth year]    DOA: 10/23/2013  PCP: Mia Creek, MD  Assessment/Plan: Principal Problem:   Atypical chest pain -Rule out for acute ACS, cardiac troponins negative so far - Ordered D dimer, elevated at 1.78, ordered stat CT angiogram of the chest to rule out PE,  - Cardiology consultation obtained, did not recommend any inpatient stress testing, followup with cardiology Dr. Excell Seltzer of patient   Active Problems:   Tobacco abuse Patient was counseled against smoking    Dyslipidemia -Patient reports allergy to statins      Type 2 diabetes mellitus - Continue sliding scale insulin     Seizure disorder Continue Dilantin per home dose      Bradycardia - Asymptomatic, no heart blocks on the EKG, possibly relative bradycardia due to recent GI illness PTA     DVT Prophylaxis:  Code Status:  Disposition: home if CT angiogram was negative for any PE     Subjective:  denies any specific complaints wants to see his cardiologist   Objective: Weight change:   Intake/Output Summary (Last 24 hours) at 10/24/13 1341 Last data filed at 10/24/13 1100  Gross per 24 hour  Intake    660 ml  Output      0 ml  Net    660 ml   Blood pressure 129/76, pulse 52, temperature 98.2 F (36.8 C), temperature source Oral, resp. rate 18, height 6\' 3"  (1.905 m), weight 70.988 kg (156 lb 8 oz), SpO2 99.00%.  Physical Exam: General: Alert and awake, oriented x3, not in any acute distress. CVS: S1-S2 clear, no murmur rubs or gallops Chest: clear to auscultation bilaterally, no wheezing, rales or rhonchi Abdomen: soft nontender, nondistended, normal bowel sounds  Extremities: no cyanosis, clubbing or edema noted bilaterally Neuro: Cranial nerves II-XII intact, no focal neurological deficits  Lab Results: Basic Metabolic Panel:  Recent Labs Lab 10/23/13 1633 10/24/13 0535  NA 136 141  K 3.9 3.8  CL 103 106  CO2 25 22  GLUCOSE 90  108*  BUN 11 9  CREATININE 0.65 0.68  CALCIUM 9.4 8.8   Liver Function Tests:  Recent Labs Lab 10/23/13 1633  AST 14  ALT 9  ALKPHOS 69  BILITOT 0.2*  PROT 6.7  ALBUMIN 3.8    Recent Labs Lab 10/23/13 1633  LIPASE 40   No results found for this basename: AMMONIA,  in the last 168 hours CBC:  Recent Labs Lab 10/23/13 1633 10/24/13 0535  WBC 3.9* 3.8*  HGB 13.9 13.2  HCT 40.7 39.0  MCV 91.5 92.2  PLT 145* 151   Cardiac Enzymes:  Recent Labs Lab 10/23/13 2335 10/24/13 0535 10/24/13 1200  TROPONINI <0.30 <0.30 <0.30   BNP: No components found with this basename: POCBNP,  CBG:  Recent Labs Lab 10/24/13 0501 10/24/13 0748 10/24/13 1126  GLUCAP 93 155* 52*     Micro Results: No results found for this or any previous visit (from the past 240 hour(s)).  Studies/Results: Dg Chest 2 View  10/23/2013   CLINICAL DATA:  Chest pain, shortness of breath  EXAM: CHEST  2 VIEW  COMPARISON:  07/02/2013  FINDINGS: Lungs are clear. No pleural effusion or pneumothorax.  The heart is normal in size.  Mild degenerative changes of the visualized thoracolumbar spine.  IMPRESSION: No evidence of acute cardiopulmonary disease.   Electronically Signed   By: Charline Bills M.D.   On: 10/23/2013 17:49  Medications: Scheduled Meds: . aspirin EC  325 mg Oral Daily  . clopidogrel  75 mg Oral Q breakfast  . enoxaparin (LOVENOX) injection  40 mg Subcutaneous QHS  . glipiZIDE  10 mg Oral BID AC  . insulin aspart  0-5 Units Subcutaneous QHS  . insulin aspart  0-9 Units Subcutaneous TID WC  . lisinopril  2.5 mg Oral Daily  . OxyCODONE  10 mg Oral Once  . phenytoin  200 mg Oral Daily  . phenytoin  300 mg Oral QHS  . sodium chloride  3 mL Intravenous Q12H      LOS: 1 day   Judge Duque M.D. [name] 10/24/2013, 1:41 PM Pager: [phone]  If 7PM-7AM, please contact night-coverage [email or web address] Password [email or web address]

## 2013-10-24 NOTE — Consult Note (Signed)
CARDIOLOGY CONSULT NOTE   Patient ID: [name] [name] MRN: [id number] DOB/AGE: 11/25/1951 62 y.o.  Admit date: 10/23/2013 Referring Physician:  Isidoro Donning Primary Physician: Mia Creek, MD Primary Cardiologist:  Nahser Reason for Consultation:  Chest Pain  Principal Problem:   Atypical chest pain Active Problems:   Tobacco abuse   Dyslipidemia   Type 2 diabetes mellitus   Seizure disorder   Anxiety and depression   Peripheral vascular disease, unspecified   Generalized weakness   Bradycardia   CAD (coronary artery disease)   HPI:   62 yo history of DM and CAD.  DES to RCA in July.  Had CTO of circumflex with robust collaterals from septals.  complaints of chest discomfort, SOB, and myalgias and fatigue x 3 days. He denies having any fevers or chills or nausea vomiting or diarrhea.  He was seen in the ED and evaluated and found to have bradycardic episodes with the lowest heart rate of 38 so he was refered for a cardiac rule out. No pain this am He has anxiety issues When I interviewed him he was concerned only about low BS.  No symptomatic bradycardia.  Wants to go home "I have a court date in am" Got insulin earlier and normally does not use  Pain was sharp intermitant and stabbing.  Note he was in ER quite a bit post stent with atypical complaints     ROS All other systems reviewed and negative except as noted above  Past Medical History  Diagnosis Date  . Diabetes mellitus   . Hyperlipidemia   . Hypertension   . Arthritis   . Leg pain   . Seizure disorder   . Peripheral vascular disease     s/p prior Ao-Bifem BPG  . 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 Coronary artery disease     a. NSTEMI 7/14=> LHC 06/07/13: Proximal LAD 30%, ostial D1 30%, AV circumflex 80%, then occluded, ostial OM2 occluded, left to left collaterals, mid RCA 95%, inferior HK, EF 60%. PCI: Promus DES to the mid RCA.  No intervention was recommended for  the CTO circumflex.  . Chronic kidney disease     hx UTI, and kidney stones  . Ischemic cardiomyopathy     a. Echocardiogram 06/06/13: EF 35-40%, inferior AK, mild MR.    Family History  Problem Relation Age of Onset  . Hypertension Mother   . Heart attack Mother   . Diabetes Mother   . Aneurysm Father   . Heart attack Sister   . Heart disease Brother     heart transplant    History   Social History  . Marital Status: Single    Spouse Name: N/A    Number of Children: N/A  . Years of Education: N/A   Occupational History  . Not on file.   Social History Main Topics  . 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 Smokeless tobacco: Not on file  . Alcohol Use: No     Comment: alcohol abuse in the past  . Drug Use: 1.00 per week    Special: Marijuana     Comment: stopped May 07, 2012  . Sexual Activity: Not on file   Other Topics Concern  . Not on file   Social History Narrative  . No narrative on file    Past Surgical History  Procedure Laterality Date  . Thrombectomy      left to right fem-fem bypass  . Hernia repair    . Aortogram with iliac artery stenting  05/06/2011  . Tonsillectomy    . Aorta - bilateral femoral artery bypass graft  11/25/2011    Procedure: AORTA BIFEMORAL BYPASS GRAFT;  Surgeon: Larina Earthly, MD;  Location: [facility] OR;  Service: Vascular;  Laterality: N/A;  . Pr vein bypass graft,aorto-fem-pop  11/24/12     . aspirin EC  325 mg Oral Daily  . clopidogrel  75 mg Oral Q breakfast  . enoxaparin (LOVENOX) injection  40 mg Subcutaneous QHS  . glipiZIDE  10 mg Oral BID AC  . insulin aspart  0-5 Units Subcutaneous QHS  . insulin aspart  0-9 Units Subcutaneous TID WC  . phenytoin  200 mg Oral Daily  . phenytoin  300 mg Oral QHS  . sodium chloride  3 mL Intravenous Q12H   . sodium chloride      Physical Exam: Blood pressure 129/76, pulse 52, temperature 98.2 F (36.8 C), temperature source  Oral, resp. rate 18, height 6\' 3"  (1.905 m), weight 156 lb 8 oz (70.988 kg), SpO2 99.00%.   Affect rushed and anxious  Thin black male  HEENT: normal Neck supple with no adenopathy JVP normal no bruits no thyromegaly Lungs clear with no wheezing and good diaphragmatic motion Heart:  S1/S2 no murmur, no rub, gallop or click PMI normal Abdomen: benighn, BS positve, no tenderness, no AAA no bruit.  No HSM or HJR Distal pulses intact with no bruits No edema Neuro non-focal Skin warm and dry No muscular weakness   Labs:   Lab Results  Component Value Date   WBC 3.8* 10/24/2013   HGB 13.2 10/24/2013   HCT 39.0 10/24/2013   MCV 92.2 10/24/2013   PLT 151 10/24/2013    Recent Labs Lab 10/23/13 1633 10/24/13 0535  NA 136 141  K 3.9 3.8  CL 103 106  CO2 25 22  BUN 11 9  CREATININE 0.65 0.68  CALCIUM 9.4 8.8  PROT 6.7  --   BILITOT 0.2*  --   ALKPHOS 69  --   ALT 9  --   AST 14  --   GLUCOSE 90 108*   Lab Results  Component Value Date   TROPONINI <0.30 10/24/2013    Lab Results  Component Value Date   CHOL 271* 06/06/2013   CHOL 343* 11/12/2010   Lab Results  Component Value Date   HDL 39* 06/06/2013   HDL 38* 11/12/2010   Lab Results  Component Value Date   LDLCALC 207* 06/06/2013   LDLCALC See Comment mg/dL 52/84/1324   Lab Results  Component Value Date   TRIG 125 06/06/2013   TRIG 403* 11/12/2010   Lab Results  Component Value Date   CHOLHDL 6.9 06/06/2013   CHOLHDL 9.0 Ratio 11/12/2010   No results found for this basename: LDLDIRECT      Radiology: Dg Chest 2 View  10/23/2013   CLINICAL DATA:  Chest pain, shortness of breath  EXAM: CHEST  2 VIEW  COMPARISON:  07/02/2013  FINDINGS: Lungs are clear. No pleural effusion or pneumothorax.  The heart is normal in size.  Mild degenerative changes of the visualized thoracolumbar spine.  IMPRESSION: No evidence of acute cardiopulmonary disease.   Electronically Signed   By: Charline Bills M.D.   On:  10/23/2013 17:49    EKG:  SR no acute changes  ASSESSMENT AND PLAN:  Chest Pain:  No evidence of stent restenosis Continue ASA and Plavix With CTO and collaterals no role for stress or perfusion testing as collaterals would likely show up positive.  Continue ASA and plaix avoid beta blocker with relative bradycardia DM:  Check BS now A1c pending ? Start on ACE for renal protection Chol: intolerant to statins  Ok to d/c home  Signed: Charlton Haws 10/24/2013, 11:27 AM

## 2013-10-24 NOTE — ED Provider Notes (Signed)
Medical screening examination/treatment/procedure(s) were performed by non-physician practitioner and as supervising physician I was immediately available for consultation/collaboration.  EKG Interpretation    Date/Time:  Saturday October 23 2013 20:02:12 EST Ventricular Rate:  47 PR Interval:  174 QRS Duration: 105 QT Interval:  497 QTC Calculation: 439 R Axis:   85 Text Interpretation:  Sinus bradycardia Anteroseptal infarct, old Unchanged ECG Confirmed by WARD  DO, KRISTEN ([id number]) on 10/23/2013 8:26:05 PM              Layla Maw Ward, DO 10/24/13 1610

## 2013-10-24 NOTE — Discharge Summary (Signed)
Physician Discharge Summary  Patient ID: [name] [name] MRN: [id number] DOB/AGE: [id number] y.o.  Admit date: 10/23/2013 Discharge date: 10/24/2013  Primary Care Physician:  Mia Creek, MD  Final Discharge Diagnoses:   Atypical chest pain with myalgias resolved   Secondary discharge diagnosis . Generalized weakness . Type 2 diabetes mellitus . Tobacco abuse . Dyslipidemia . Peripheral vascular disease, unspecified . Seizure disorder . Anxiety and depression . Bradycardia . CAD (coronary artery disease) -Chronic pain syndrome    Consults:  cardiology, Dr. Eden Emms  Allergies:   Allergies  Allergen Reactions  . Crestor [Rosuvastatin] Other (See Comments)    Malaise & muscle weakness  . Lipitor [Atorvastatin] Other (See Comments)    Malaise & muscle weakness  . Penicillins Other (See Comments)    Unknown  . Zetia [Ezetimibe] Swelling     Discharge Medications:   Medication List         aspirin EC 81 MG tablet  Take 81 mg by mouth daily.     clopidogrel 75 MG tablet  Commonly known as:  PLAVIX  Take 75 mg by mouth daily with breakfast.     glipiZIDE 10 MG tablet  Commonly known as:  GLUCOTROL  Take 10 mg by mouth 2 (two) times daily before a meal.     lisinopril 2.5 MG tablet  Commonly known as:  PRINIVIL,ZESTRIL  Take 1 tablet (2.5 mg total) by mouth daily.     nitroGLYCERIN 0.4 MG SL tablet  Commonly known as:  NITROSTAT  Place 0.4 mg under the tongue every 5 (five) minutes as needed for chest pain.     OXYCONTIN 10 mg T12a 12 hr tablet  Generic drug:  OxyCODONE  Take 10 mg by mouth 2 (two) times daily as needed (for pain).     phenytoin 100 MG ER capsule  Commonly known as:  DILANTIN  Take 200-300 mg by mouth 2 (two) times daily. Take 200 mg in the morning & 300 mg in the evening.     promethazine 12.5 MG tablet  Commonly known as:  PHENERGAN  Take 1 tablet (12.5 mg total) by mouth every 6 (six) hours as needed for nausea or vomiting.      triamcinolone cream 0.1 %  Commonly known as:  KENALOG  Apply 1 application topically 2 (two) times daily as needed (for eczema).     zolpidem 5 MG tablet  Commonly known as:  AMBIEN  Take 5 mg by mouth at bedtime as needed. For sleep         Brief H and P: For complete details please refer to admission H and P, but in brief patient is a 62-year-old male who presented to ER with chest discomfort, shortness of breath, myalgias and fatigue for last 3 days. He denied having any fevers or chills. Has a history of CAD. Patient was seen in the ED and was having bradycardiac episodes so was referred for cardiac rule out.   Hospital Course:  Atypical chest pain: Resolved, troponins were negative for acute ACS. EKG was unchanged. Patient was ruled out for an acute MI. However patient had shortness of breath with the chest discomfort with dyspnea, d-dimer was elevated at 1.78. CT angiogram of the chest showed no evidence of pulmonary embolus or any acute abnormality. Cardiology consultation was obtained as patient has a history of CAD with PCI, diabetes, hypertension, hyperlipidemia. Patient had an echo in September 2014 which had shown EF of 55-60%, normal wall motion. Per cardiology,  Dr Eden Emms, continue aspirin and Plavix. No role of stress or perfusion testing as collaterals would likely show positive. Avoid beta blocker with relative bradycardia. He will follow up with his regular cardiologist, Dr. Tonny Bollman in 2 weeks.  Bradycardia: Patient had no heart blocks on the EKG or telemetry monitor. He did mention having "food poisoning" with nausea and vomiting, myalgias in the last 3 days which could have caused vasovagal effect and bradycardia. His heart rate has been in the 50s.  Diabetes: Patient's blood sugars remained controlled, he was recommended to start on low dose ACE inhibitor for  renal protection  Day of Discharge BP 129/76  Pulse 52  Temp(Src) 98.2 F (36.8 C) (Oral)  Resp  18  Ht 6\' 3"  (1.905 m)  Wt 70.988 kg (156 lb 8 oz)  BMI 19.56 kg/m2  SpO2 99%  Physical Exam: General: Alert and awake oriented x3 not in any acute distress. CVS: S1-S2 clear no murmur rubs or gallops Chest: clear to auscultation bilaterally, no wheezing rales or rhonchi Abdomen: soft nontender, nondistended, normal bowel sounds Extremities: no cyanosis, clubbing or edema noted bilaterally Neuro: Cranial nerves II-XII intact, no focal neurological deficits   The results of significant diagnostics from this hospitalization (including imaging, microbiology, ancillary and laboratory) are listed below for reference.    LAB RESULTS: Basic Metabolic Panel:  Recent Labs Lab 10/23/13 1633 10/24/13 0535  NA 136 141  K 3.9 3.8  CL 103 106  CO2 25 22  GLUCOSE 90 108*  BUN 11 9  CREATININE 0.65 0.68  CALCIUM 9.4 8.8   Liver Function Tests:  Recent Labs Lab 10/23/13 1633  AST 14  ALT 9  ALKPHOS 69  BILITOT 0.2*  PROT 6.7  ALBUMIN 3.8    Recent Labs Lab 10/23/13 1633  LIPASE 40   No results found for this basename: AMMONIA,  in the last 168 hours CBC:  Recent Labs Lab 10/23/13 1633 10/24/13 0535  WBC 3.9* 3.8*  HGB 13.9 13.2  HCT 40.7 39.0  MCV 91.5 92.2  PLT 145* 151   Cardiac Enzymes:  Recent Labs Lab 10/24/13 0535 10/24/13 1200  TROPONINI <0.30 <0.30   BNP: No components found with this basename: POCBNP,  CBG:  Recent Labs Lab 10/24/13 0748 10/24/13 1126  GLUCAP 155* 52*    Significant Diagnostic Studies:  Dg Chest 2 View  10/23/2013   CLINICAL DATA:  Chest pain, shortness of breath  EXAM: CHEST  2 VIEW  COMPARISON:  07/02/2013  FINDINGS: Lungs are clear. No pleural effusion or pneumothorax.  The heart is normal in size.  Mild degenerative changes of the visualized thoracolumbar spine.  IMPRESSION: No evidence of acute cardiopulmonary disease.   Electronically Signed   By: Charline Bills M.D.   On: 10/23/2013 17:49     Disposition and  Follow-up:     Discharge Orders   Future Orders Complete By Expires   Diet Carb Modified  As directed    Increase activity slowly  As directed        DISPOSITION: Home   DIET: Heart healthy diet   ACTIVITY:As tolerated    DISCHARGE FOLLOW-UP Follow-up Information   Follow up with TALBOT, DAVID C, MD. Schedule an appointment as soon as possible for a visit in 10 days. (for hospital follow-up)    Specialty:  Internal Medicine   Contact information:   [address] [place] [address] [phone]       Follow up with Casimiro Needle  Excell Seltzer, MD. Schedule an appointment as soon as possible for a visit in 2 weeks.   Specialty:  Cardiology   Contact information:   [address] [place] [address] [phone]       Time spent on Discharge: 40 minutes   Signed:   Kesleigh Morson M.D. [name] 10/24/2013, 1:30 PM Pager: [phone]

## 2013-10-25 LAB — GLUCOSE, CAPILLARY: Glucose-Capillary: 138 mg/dL — ABNORMAL HIGH (ref 70–99)

## 2013-11-01 ENCOUNTER — Emergency Department (HOSPITAL_COMMUNITY)
Admission: EM | Admit: 2013-11-01 | Discharge: 2013-11-01 | Disposition: A | Discharge status: Home / Self Care | Payer: Medicaid Other | Attending: Emergency Medicine | Admitting: Emergency Medicine

## 2013-11-01 ENCOUNTER — Encounter (HOSPITAL_COMMUNITY): Payer: Self-pay | Admitting: Emergency Medicine

## 2013-11-01 ENCOUNTER — Emergency Department (HOSPITAL_COMMUNITY): Payer: Medicaid Other

## 2013-11-01 DIAGNOSIS — Z7901 Long term (current) use of anticoagulants: Secondary | ICD-10-CM | POA: Insufficient documentation

## 2013-11-01 DIAGNOSIS — R0600 Dyspnea, unspecified: Secondary | ICD-10-CM

## 2013-11-01 DIAGNOSIS — I129 Hypertensive chronic kidney disease with stage 1 through stage 4 chronic kidney disease, or unspecified chronic kidney disease: Secondary | ICD-10-CM | POA: Insufficient documentation

## 2013-11-01 DIAGNOSIS — R109 Unspecified abdominal pain: Secondary | ICD-10-CM | POA: Insufficient documentation

## 2013-11-01 DIAGNOSIS — G40909 Epilepsy, unspecified, not intractable, without status epilepticus: Secondary | ICD-10-CM | POA: Insufficient documentation

## 2013-11-01 DIAGNOSIS — R531 Weakness: Secondary | ICD-10-CM

## 2013-11-01 DIAGNOSIS — K5900 Constipation, unspecified: Secondary | ICD-10-CM | POA: Insufficient documentation

## 2013-11-01 DIAGNOSIS — Z7982 Long term (current) use of aspirin: Secondary | ICD-10-CM | POA: Insufficient documentation

## 2013-11-01 DIAGNOSIS — Z880 Allergy status to penicillin: Secondary | ICD-10-CM | POA: Insufficient documentation

## 2013-11-01 DIAGNOSIS — I2510 Atherosclerotic heart disease of native coronary artery without angina pectoris: Secondary | ICD-10-CM | POA: Insufficient documentation

## 2013-11-01 DIAGNOSIS — Z79899 Other long term (current) drug therapy: Secondary | ICD-10-CM | POA: Insufficient documentation

## 2013-11-01 DIAGNOSIS — Z951 Presence of aortocoronary bypass graft: Secondary | ICD-10-CM | POA: Insufficient documentation

## 2013-11-01 DIAGNOSIS — N189 Chronic kidney disease, unspecified: Secondary | ICD-10-CM | POA: Insufficient documentation

## 2013-11-01 DIAGNOSIS — R0602 Shortness of breath: Secondary | ICD-10-CM | POA: Insufficient documentation

## 2013-11-01 DIAGNOSIS — Z7902 Long term (current) use of antithrombotics/antiplatelets: Secondary | ICD-10-CM | POA: Insufficient documentation

## 2013-11-01 DIAGNOSIS — IMO0001 Reserved for inherently not codable concepts without codable children: Secondary | ICD-10-CM | POA: Insufficient documentation

## 2013-11-01 DIAGNOSIS — F17200 Nicotine dependence, unspecified, uncomplicated: Secondary | ICD-10-CM | POA: Insufficient documentation

## 2013-11-01 DIAGNOSIS — E119 Type 2 diabetes mellitus without complications: Secondary | ICD-10-CM | POA: Insufficient documentation

## 2013-11-01 DIAGNOSIS — M129 Arthropathy, unspecified: Secondary | ICD-10-CM | POA: Insufficient documentation

## 2013-11-01 DIAGNOSIS — M791 Myalgia, unspecified site: Secondary | ICD-10-CM

## 2013-11-01 DIAGNOSIS — R5381 Other malaise: Secondary | ICD-10-CM | POA: Insufficient documentation

## 2013-11-01 LAB — COMPREHENSIVE METABOLIC PANEL
ALT: 16 U/L (ref 0–53)
AST: 22 U/L (ref 0–37)
Albumin: 4.1 g/dL (ref 3.5–5.2)
Calcium: 9.4 mg/dL (ref 8.4–10.5)
Chloride: 103 mEq/L (ref 96–112)
Creatinine, Ser: 0.53 mg/dL (ref 0.50–1.35)
GFR calc non Af Amer: >90 mL/min (ref >90)
Sodium: 137 mEq/L (ref 135–145)
Total Bilirubin: 0.2 mg/dL — ABNORMAL LOW (ref 0.3–1.2)
Total Protein: 7.1 g/dL (ref 6.0–8.3)

## 2013-11-01 LAB — URINALYSIS, ROUTINE W REFLEX MICROSCOPIC
Bilirubin Urine: NEGATIVE
Glucose, UA: NEGATIVE mg/dL
Hgb urine dipstick: NEGATIVE
Ketones, ur: NEGATIVE mg/dL
Protein, ur: NEGATIVE mg/dL
Urobilinogen, UA: 0.2 mg/dL (ref 0.0–1.0)

## 2013-11-01 LAB — CBC WITH DIFFERENTIAL/PLATELET
Basophils Absolute: 0.1 10*3/uL (ref 0.0–0.1)
Basophils Relative: 2 % — ABNORMAL HIGH (ref 0–1)
Eosinophils Absolute: 0.2 10*3/uL (ref 0.0–0.7)
Eosinophils Relative: 3 % (ref 0–5)
HCT: 42.3 % (ref 39.0–52.0)
MCH: 31.9 pg (ref 26.0–34.0)
MCHC: 34.3 g/dL (ref 30.0–36.0)
MCV: 93 fL (ref 78.0–100.0)
Monocytes Absolute: 0.4 10*3/uL (ref 0.1–1.0)
Platelets: 153 10*3/uL (ref 150–400)
RDW: 13.5 % (ref 11.5–15.5)

## 2013-11-01 LAB — POCT I-STAT TROPONIN I: Troponin i, poc: 0.01 ng/mL (ref 0.00–0.08)

## 2013-11-01 MED ORDER — OXYCODONE-ACETAMINOPHEN 5-325 MG PO TABS
2.0000 | ORAL_TABLET | Freq: Once | ORAL | Status: AC
  Administered 2013-11-01: 2 via ORAL
  Filled 2013-11-01: qty 2

## 2013-11-01 MED ORDER — OXYCODONE-ACETAMINOPHEN 5-325 MG PO TABS: 2.0000 | ORAL_TABLET | ORAL | Status: DC | PRN

## 2013-11-01 NOTE — ED Provider Notes (Signed)
CSN: [id number]     Arrival date & time 11/01/13  0246 History   First MD Initiated Contact with Patient 11/01/13 [phone]     Chief Complaint  Patient presents with  . Abdominal Pain   (Consider location/radiation/quality/duration/timing/severity/associated sxs/prior Treatment) HPI 62 yo male presents to the ER with complaint of malaise, abdominal pain, weakness, shortness of breath.  Symptoms have been ongoing for the last 11 days.  Pt was seen and admitted for same, also seen by his primary care Dr. this past Wednesday.  No specific cause was found.  Thought to be influenza-like virus.  Patient denies any nausea or vomiting.  He, reports he's had some constipation, but this is improving with MiraLAX.  No fevers or chills.  Patient reports that he lost the prescription for pain medicine given to him by his doctor on Wednesday.  Patient reports that he felt like his blood pressure was up earlier tonight, and called 911.  They came out to check his blood pressure, and it was normal.  Patient reports shortness of breath with exertion.  No cough.  No leg swelling.  No history of PE or DVT. Past Medical History  Diagnosis Date  . Diabetes mellitus   . Hyperlipidemia   . Hypertension   . Arthritis   . Leg pain   . Seizure disorder   . Peripheral vascular disease     s/p prior Ao-Bifem BPG  . 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 Coronary artery disease     a. NSTEMI 7/14=> LHC 06/07/13: Proximal LAD 30%, ostial D1 30%, AV circumflex 80%, then occluded, ostial OM2 occluded, left to left collaterals, mid RCA 95%, inferior HK, EF 60%. PCI: Promus DES to the mid RCA.  No intervention was recommended for the CTO circumflex.  . Chronic kidney disease     hx UTI, and kidney stones  . Ischemic cardiomyopathy     a. Echocardiogram 06/06/13: EF 35-40%, inferior AK, mild MR.   Past Surgical History  Procedure Laterality Date  . Thrombectomy      left to  right fem-fem bypass  . Hernia repair    . Aortogram with iliac artery stenting  05/06/2011  . Tonsillectomy    . Aorta - bilateral femoral artery bypass graft  11/25/2011    Procedure: AORTA BIFEMORAL BYPASS GRAFT;  Surgeon: Larina Earthly, MD;  Location: [facility] OR;  Service: Vascular;  Laterality: N/A;  . Pr vein bypass graft,aorto-fem-pop  11/24/12   Family History  Problem Relation Age of Onset  . Hypertension Mother   . Heart attack Mother   . Diabetes Mother   . Aneurysm Father   . Heart attack Sister   . Heart disease Brother     heart transplant   History  Substance Use Topics  . 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 Smokeless tobacco: Not on file  . Alcohol Use: No     Comment: alcohol abuse in the past    Review of Systems  See History of Present Illness; otherwise all other systems are reviewed and negative  Allergies  Crestor; Lipitor; Penicillins; and Zetia  Home Medications   Current Outpatient Rx  Name  Route  Sig  Dispense  Refill  . acetaminophen (TYLENOL) 500 MG tablet   Oral   Take 1,000 mg by mouth every 6 (six) hours as needed  for moderate pain.         Marland Kitchen aspirin EC 81 MG tablet   Oral   Take 81 mg by mouth daily.         . clopidogrel (PLAVIX) 75 MG tablet   Oral   Take 75 mg by mouth daily with breakfast.         . glipiZIDE (GLUCOTROL) 10 MG tablet   Oral   Take 10 mg by mouth 2 (two) times daily before a meal.         . guaiFENesin-dextromethorphan (ROBITUSSIN DM) 100-10 MG/5ML syrup   Oral   Take 10 mLs by mouth every 4 (four) hours as needed for cough.         . OxyCODONE (OXYCONTIN) 10 mg T12A 12 hr tablet   Oral   Take 10 mg by mouth 3 (three) times daily as needed (for pain).         . phenytoin (DILANTIN) 100 MG ER capsule   Oral   Take 200-300 mg by mouth 2 (two) times daily. Take 200 mg in the morning & 300 mg in the evening.         . polyethylene glycol  (MIRALAX / GLYCOLAX) packet   Oral   Take 17 g by mouth daily as needed.         . promethazine (PHENERGAN) 12.5 MG tablet   Oral   Take 1 tablet (12.5 mg total) by mouth every 6 (six) hours as needed for nausea or vomiting.   30 tablet   0   . triamcinolone cream (KENALOG) 0.1 %   Topical   Apply 1 application topically 2 (two) times daily as needed (for eczema).         . zolpidem (AMBIEN) 5 MG tablet   Oral   Take 5 mg by mouth at bedtime as needed. For sleep         . nitroGLYCERIN (NITROSTAT) 0.4 MG SL tablet   Sublingual   Place 0.4 mg under the tongue every 5 (five) minutes as needed for chest pain.         Marland Kitchen oxyCODONE-acetaminophen (PERCOCET/ROXICET) 5-325 MG per tablet   Oral   Take 2 tablets by mouth every 4 (four) hours as needed for severe pain.   10 tablet   0    BP 128/80  Pulse 65  Temp(Src) 97.9 F (36.6 C)  Resp 16  SpO2 99% Physical Exam  Nursing note and vitals reviewed. Constitutional: He is oriented to person, place, and time. He appears well-developed and well-nourished. No distress.  HENT:  Head: Normocephalic and atraumatic.  Right Ear: External ear normal.  Left Ear: External ear normal.  Nose: Nose normal.  Mouth/Throat: Oropharynx is clear and moist.  Eyes: Conjunctivae and EOM are normal. Pupils are equal, round, and reactive to light.  Neck: Normal range of motion. Neck supple. No JVD present. No tracheal deviation present. No thyromegaly present.  Cardiovascular: Normal rate, regular rhythm, normal heart sounds and intact distal pulses.  Exam reveals no gallop and no friction rub.   No murmur heard. Pulmonary/Chest: Effort normal and breath sounds normal. No stridor. No respiratory distress. He has no wheezes. He has no rales. He exhibits no tenderness.  Abdominal: Soft. Bowel sounds are normal. He exhibits no distension and no mass. There is no tenderness. There is no rebound and no guarding.  Patient reports abdominal pain,  reports by palpating his belly makes it feel better.  Musculoskeletal: Normal range of motion. He exhibits no edema and no tenderness.  Lymphadenopathy:    He has no cervical adenopathy.  Neurological: He is alert and oriented to person, place, and time. He exhibits normal muscle tone. Coordination normal.  Skin: Skin is warm and dry. No rash noted. No erythema. No pallor.  Psychiatric: He has a normal mood and affect. His behavior is normal. Judgment and thought content normal.    ED Course  Procedures (including critical care time) Labs Review Labs Reviewed  CBC WITH DIFFERENTIAL - Abnormal; Notable for the following:    Basophils Relative 2 (*)    All other components within normal limits  COMPREHENSIVE METABOLIC PANEL - Abnormal; Notable for the following:    Total Bilirubin 0.2 (*)    All other components within normal limits  URINALYSIS, ROUTINE W REFLEX MICROSCOPIC  LIPASE, BLOOD  CBC WITH DIFFERENTIAL  COMPREHENSIVE METABOLIC PANEL  LIPASE, BLOOD  POCT I-STAT TROPONIN I   Imaging Review Dg Abd Acute W/chest  11/01/2013   CLINICAL DATA:  Shortness of breath and abdominal pain.  EXAM: ACUTE ABDOMEN SERIES (ABDOMEN 2 VIEW & CHEST 1 VIEW)  COMPARISON:  CT angiogram of the chest October 24, 2013.  FINDINGS: Cardiomediastinal silhouette is unremarkable. The lungs are clear. No pneumothorax. Multiple EKG lines overlie the patient and may obscure subtle underlying pathology. Soft tissue planes and included osseous structures are nonsuspicious.  Bowel gas pattern is nondilated and nonobstructive. Aortoiliac stent noted. No intra-abdominal mass effect, pathologic calcifications or free air. Mild degenerative change of the lumbar spine. Surgical clip projects over the right femoral head likely reflects prior catheterization.  IMPRESSION: No acute cardiopulmonary process.  Nonspecific bowel gas pattern.   Electronically Signed   By: Awilda Metro   On: 11/01/2013 05:39    EKG  Interpretation    Date/Time:  Monday November 01 2013 03:17:43 EST Ventricular Rate:  54 PR Interval:  169 QRS Duration: 107 QT Interval:  463 QTC Calculation: 439 R Axis:   86 Text Interpretation:  Sinus rhythm Anterior infarct, old No significant change since last tracing Confirmed by Darrielle Pflieger  MD, Millisa Giarrusso ([id number]) on 11/01/2013 5:07:10 AM            MDM   1. Dyspnea   2. Generalized weakness   3. Abdominal pain   4. Myalgia    62 year old male with multiple complaints.  No specific findings for his complaints.  Patient not hypoxic here, no increased work of breathing.  His chest x-ray is clear.  We'll plan to have him follow up with primary care Dr.    Olivia Mackie, MD 11/01/13 [phone]

## 2013-11-01 NOTE — ED Notes (Signed)
Per EMS: Pt c/o chronic abdominal pain and back pain. Pt reports pain increased at 20:00. Pt states he has nausea but no vomiting. Pt also reports constipation without relief from Miralax. Pt has history of Aorta surgery. Some SOB reported by patient. Pt bradycardic @ 56 bpm, BP 142/92, O2 100%, & R 20.

## 2013-11-01 NOTE — ED Notes (Signed)
Patient continues to request pain medication at this time. [facility] aware, but has not seen patient yet. Informed patient that MD should be able to see him soon. Patient agreeable.

## 2013-11-02 ENCOUNTER — Telehealth: Payer: Self-pay | Admitting: Cardiovascular Disease

## 2013-11-02 NOTE — Telephone Encounter (Signed)
Left message on machine for pt to contact the office.   

## 2013-11-02 NOTE — Telephone Encounter (Signed)
New problem   Pt need to speak to nurse concerning his heart rate and pulse rate. Please call pt

## 2013-11-02 NOTE — Telephone Encounter (Signed)
I spoke with the pt and he states that he thinks he has turned the corner today and has noticed some improvement in his symptoms. The pt called because in the ER his monitor kept ringing when his heart rate was 50.  I made the pt aware that this is okay.  The monitors have settings on them and the staff have to set parameters to keep the monitors from ringing. The pt does still have some chest discomfort and he states that he moved a very heavy TV and could have pulled a muscle.  I advised the pt that if he continued to have symptoms then he could call our office to schedule an appointment.  I made the pt aware that he has had cardiac enzymes and EKG performed during hospital admission and ER visit and these were okay. At this time the pt will continue to remain well hydrated for possible viral illness.

## 2013-11-03 ENCOUNTER — Encounter: Payer: Medicaid Other | Admitting: Physician Assistant

## 2013-11-07 ENCOUNTER — Emergency Department (HOSPITAL_COMMUNITY)
Admission: EM | Admit: 2013-11-07 | Discharge: 2013-11-07 | Disposition: A | Discharge status: Home / Self Care | Payer: Medicaid Other | Attending: Emergency Medicine | Admitting: Emergency Medicine

## 2013-11-07 ENCOUNTER — Encounter (HOSPITAL_COMMUNITY): Payer: Self-pay | Admitting: Emergency Medicine

## 2013-11-07 ENCOUNTER — Emergency Department (HOSPITAL_COMMUNITY): Payer: Medicaid Other

## 2013-11-07 DIAGNOSIS — Y939 Activity, unspecified: Secondary | ICD-10-CM | POA: Insufficient documentation

## 2013-11-07 DIAGNOSIS — Z79899 Other long term (current) drug therapy: Secondary | ICD-10-CM | POA: Insufficient documentation

## 2013-11-07 DIAGNOSIS — E119 Type 2 diabetes mellitus without complications: Secondary | ICD-10-CM | POA: Insufficient documentation

## 2013-11-07 DIAGNOSIS — I2510 Atherosclerotic heart disease of native coronary artery without angina pectoris: Secondary | ICD-10-CM | POA: Insufficient documentation

## 2013-11-07 DIAGNOSIS — M542 Cervicalgia: Secondary | ICD-10-CM | POA: Insufficient documentation

## 2013-11-07 DIAGNOSIS — Z7982 Long term (current) use of aspirin: Secondary | ICD-10-CM | POA: Insufficient documentation

## 2013-11-07 DIAGNOSIS — G40909 Epilepsy, unspecified, not intractable, without status epilepticus: Secondary | ICD-10-CM | POA: Insufficient documentation

## 2013-11-07 DIAGNOSIS — M129 Arthropathy, unspecified: Secondary | ICD-10-CM | POA: Insufficient documentation

## 2013-11-07 DIAGNOSIS — M1710 Unilateral primary osteoarthritis, unspecified knee: Secondary | ICD-10-CM | POA: Insufficient documentation

## 2013-11-07 DIAGNOSIS — S40019A Contusion of unspecified shoulder, initial encounter: Secondary | ICD-10-CM | POA: Insufficient documentation

## 2013-11-07 DIAGNOSIS — F17200 Nicotine dependence, unspecified, uncomplicated: Secondary | ICD-10-CM | POA: Insufficient documentation

## 2013-11-07 DIAGNOSIS — Z87442 Personal history of urinary calculi: Secondary | ICD-10-CM | POA: Insufficient documentation

## 2013-11-07 DIAGNOSIS — I10 Essential (primary) hypertension: Secondary | ICD-10-CM | POA: Insufficient documentation

## 2013-11-07 DIAGNOSIS — X58XXXA Exposure to other specified factors, initial encounter: Secondary | ICD-10-CM | POA: Insufficient documentation

## 2013-11-07 DIAGNOSIS — S40012A Contusion of left shoulder, initial encounter: Secondary | ICD-10-CM

## 2013-11-07 DIAGNOSIS — Z880 Allergy status to penicillin: Secondary | ICD-10-CM | POA: Insufficient documentation

## 2013-11-07 DIAGNOSIS — R0789 Other chest pain: Secondary | ICD-10-CM | POA: Insufficient documentation

## 2013-11-07 DIAGNOSIS — Z7902 Long term (current) use of antithrombotics/antiplatelets: Secondary | ICD-10-CM | POA: Insufficient documentation

## 2013-11-07 DIAGNOSIS — Y929 Unspecified place or not applicable: Secondary | ICD-10-CM | POA: Insufficient documentation

## 2013-11-07 LAB — COMPREHENSIVE METABOLIC PANEL
Albumin: 4.4 g/dL (ref 3.5–5.2)
Alkaline Phosphatase: 71 U/L (ref 39–117)
BUN: 5 mg/dL — ABNORMAL LOW (ref 6–23)
CO2: 27 mEq/L (ref 19–32)
Chloride: 98 mEq/L (ref 96–112)
Creatinine, Ser: 0.62 mg/dL (ref 0.50–1.35)
GFR calc Af Amer: >90 mL/min (ref >90)
GFR calc non Af Amer: >90 mL/min (ref >90)
Glucose, Bld: 178 mg/dL — ABNORMAL HIGH (ref 70–99)
Potassium: 4.1 mEq/L (ref 3.5–5.1)
Sodium: 135 mEq/L (ref 135–145)
Total Bilirubin: 0.2 mg/dL — ABNORMAL LOW (ref 0.3–1.2)

## 2013-11-07 LAB — CBC WITH DIFFERENTIAL/PLATELET
Basophils Relative: 1 % (ref 0–1)
Eosinophils Absolute: 0.2 10*3/uL (ref 0.0–0.7)
HCT: 42.4 % (ref 39.0–52.0)
Hemoglobin: 14.7 g/dL (ref 13.0–17.0)
Lymphs Abs: 1.8 10*3/uL (ref 0.7–4.0)
MCH: 32.3 pg (ref 26.0–34.0)
MCHC: 34.7 g/dL (ref 30.0–36.0)
Monocytes Absolute: 0.3 10*3/uL (ref 0.1–1.0)
Monocytes Relative: 7 % (ref 3–12)
Neutro Abs: 2 10*3/uL (ref 1.7–7.7)
Neutrophils Relative %: 47 % (ref 43–77)
RBC: 4.55 MIL/uL (ref 4.22–5.81)

## 2013-11-07 LAB — TROPONIN I
Troponin I: <0.3 ng/mL (ref <0.30)
Troponin I: <0.3 ng/mL (ref <0.30)

## 2013-11-07 MED ORDER — MORPHINE SULFATE 4 MG/ML IJ SOLN
4.0000 mg | Freq: Once | INTRAMUSCULAR | Status: AC
  Administered 2013-11-07: 4 mg via INTRAVENOUS
  Filled 2013-11-07: qty 1

## 2013-11-07 MED ORDER — ASPIRIN 325 MG PO TABS
325.0000 mg | ORAL_TABLET | ORAL | Status: AC
  Administered 2013-11-07: 325 mg via ORAL
  Filled 2013-11-07: qty 1

## 2013-11-07 MED ORDER — HYDROMORPHONE HCL PF 1 MG/ML IJ SOLN
1.0000 mg | Freq: Once | INTRAMUSCULAR | Status: AC
  Administered 2013-11-07: 1 mg via INTRAVENOUS
  Filled 2013-11-07: qty 1

## 2013-11-07 MED ORDER — OXYCODONE-ACETAMINOPHEN 5-325 MG PO TABS: 1.0000 | ORAL_TABLET | Freq: Four times a day (QID) | ORAL | Status: DC | PRN

## 2013-11-07 NOTE — ED Provider Notes (Signed)
CSN: [id number]     Arrival date & time 11/07/13  1124 History   First MD Initiated Contact with Patient 11/07/13 1133     Chief Complaint  Patient presents with  . Chest Pain   (Consider location/radiation/quality/duration/timing/severity/associated sxs/prior Treatment) Patient is a 62 y.o. male presenting with chest pain. The history is provided by the patient.  Chest Pain Pain location:  L chest Pain quality comment:  Fullness Pain radiates to:  Does not radiate Pain radiates to the back: no   Pain severity:  Mild Onset quality:  Gradual Duration:  3 days Timing:  Intermittent Progression:  Unchanged Chronicity:  New Context: at rest   Relieved by:  Nothing Worsened by:  Nothing tried Ineffective treatments: percocet. Associated symptoms: no abdominal pain, no cough, no fever, no headache, no nausea, no numbness, no shortness of breath and not vomiting     Past Medical History  Diagnosis Date  . Diabetes mellitus   . Hyperlipidemia   . Hypertension   . Arthritis   . Leg pain   . Seizure disorder   . Peripheral vascular disease     s/p prior Ao-Bifem BPG  . 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 Coronary artery disease     a. NSTEMI 7/14=> LHC 06/07/13: Proximal LAD 30%, ostial D1 30%, AV circumflex 80%, then occluded, ostial OM2 occluded, left to left collaterals, mid RCA 95%, inferior HK, EF 60%. PCI: Promus DES to the mid RCA.  No intervention was recommended for the CTO circumflex.  . Chronic kidney disease     hx UTI, and kidney stones  . Ischemic cardiomyopathy     a. Echocardiogram 06/06/13: EF 35-40%, inferior AK, mild MR.   Past Surgical History  Procedure Laterality Date  . Thrombectomy      left to right fem-fem bypass  . Hernia repair    . Aortogram with iliac artery stenting  05/06/2011  . Tonsillectomy    . Aorta - bilateral femoral artery bypass graft  11/25/2011    Procedure: AORTA BIFEMORAL BYPASS  GRAFT;  Surgeon: Larina Earthly, MD;  Location: [facility] OR;  Service: Vascular;  Laterality: N/A;  . Pr vein bypass graft,aorto-fem-pop  11/24/12   Family History  Problem Relation Age of Onset  . Hypertension Mother   . Heart attack Mother   . Diabetes Mother   . Aneurysm Father   . Heart attack Sister   . Heart disease Brother     heart transplant   History  Substance Use Topics  . 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 Smokeless tobacco: Not on file  . Alcohol Use: No     Comment: alcohol abuse in the past    Review of Systems  Constitutional: Negative for fever.  HENT: Negative for drooling and rhinorrhea.   Eyes: Negative for pain.  Respiratory: Negative for cough and shortness of breath.   Cardiovascular: Positive for chest pain. Negative for leg swelling.  Gastrointestinal: Negative for nausea, vomiting, abdominal pain and diarrhea.  Genitourinary: Negative for dysuria and hematuria.  Musculoskeletal: Negative for gait problem and neck pain.  Skin: Negative for color change.  Neurological: Negative for numbness and headaches.  Hematological: Negative for adenopathy.  Psychiatric/Behavioral: Negative for behavioral problems.  All other systems reviewed and are negative.    Allergies  Crestor; Lipitor; Penicillins; and Zetia  Home  Medications   Current Outpatient Rx  Name  Route  Sig  Dispense  Refill  . acetaminophen (TYLENOL) 500 MG tablet   Oral   Take 1,000 mg by mouth every 6 (six) hours as needed for moderate pain.         Marland Kitchen aspirin EC 81 MG tablet   Oral   Take 81 mg by mouth daily.         . clopidogrel (PLAVIX) 75 MG tablet   Oral   Take 75 mg by mouth daily with breakfast.         . glipiZIDE (GLUCOTROL) 10 MG tablet   Oral   Take 10 mg by mouth 2 (two) times daily before a meal.         . guaiFENesin-dextromethorphan (ROBITUSSIN DM) 100-10 MG/5ML syrup   Oral   Take 10 mLs by  mouth every 4 (four) hours as needed for cough.         . nitroGLYCERIN (NITROSTAT) 0.4 MG SL tablet   Sublingual   Place 0.4 mg under the tongue every 5 (five) minutes as needed for chest pain.         Marland Kitchen oxyCODONE-acetaminophen (PERCOCET/ROXICET) 5-325 MG per tablet   Oral   Take 2 tablets by mouth every 4 (four) hours as needed for severe pain.   10 tablet   0   . phenytoin (DILANTIN) 100 MG ER capsule   Oral   Take 200-300 mg by mouth 2 (two) times daily. Take 200 mg in the morning & 300 mg in the evening.         . polyethylene glycol (MIRALAX / GLYCOLAX) packet   Oral   Take 17 g by mouth daily as needed.         . promethazine (PHENERGAN) 12.5 MG tablet   Oral   Take 1 tablet (12.5 mg total) by mouth every 6 (six) hours as needed for nausea or vomiting.   30 tablet   0   . triamcinolone cream (KENALOG) 0.1 %   Topical   Apply 1 application topically 2 (two) times daily as needed (for eczema).         . zolpidem (AMBIEN) 5 MG tablet   Oral   Take 5 mg by mouth at bedtime as needed. For sleep          BP 128/85  Pulse 55  Temp(Src) 97.5 F (36.4 C)  Resp 18  SpO2 99% Physical Exam  Nursing note and vitals reviewed. Constitutional: He is oriented to person, place, and time. He appears well-developed and well-nourished.  HENT:  Head: Normocephalic and atraumatic.  Right Ear: External ear normal.  Left Ear: External ear normal.  Nose: Nose normal.  Mouth/Throat: Oropharynx is clear and moist. No oropharyngeal exudate.  Eyes: Conjunctivae and EOM are normal. Pupils are equal, round, and reactive to light.  Neck: Normal range of motion. Neck supple.  No focal tenderness to palpation of the neck.  Cardiovascular: Normal rate, regular rhythm, normal heart sounds and intact distal pulses.  Exam reveals no gallop and no friction rub.   No murmur heard. Pulmonary/Chest: Effort normal and breath sounds normal. No respiratory distress. He has no wheezes. He  exhibits no tenderness.  Abdominal: Soft. Bowel sounds are normal. He exhibits no distension. There is no tenderness. There is no rebound and no guarding.  Musculoskeletal: Normal range of motion. He exhibits no edema and no tenderness.  Left lateral shoulder tenderness to palpation. Mild reproduction  of pain with range of motion of the left shoulder. Mild tenderness to palpation of the left midforearm. Normal sensation and motor skills in the left upper extremity. 2+ distal pulses in all extremities.   Neurological: He is alert and oriented to person, place, and time.  Skin: Skin is warm and dry.  Psychiatric: He has a normal mood and affect. His behavior is normal.    ED Course  Procedures (including critical care time) Labs Review Labs Reviewed  COMPREHENSIVE METABOLIC PANEL - Abnormal; Notable for the following:    Glucose, Bld 178 (*)    BUN 5 (*)    Total Bilirubin 0.2 (*)    All other components within normal limits  CBC WITH DIFFERENTIAL  TROPONIN I  TROPONIN I   Imaging Review Dg Chest 2 View  11/07/2013   CLINICAL DATA:  Left arm pain, chest pain  EXAM: CHEST  2 VIEW  COMPARISON:  11/01/2013  FINDINGS: The heart size and mediastinal contours are within normal limits. Both lungs are clear. The visualized skeletal structures are unremarkable. Coronary stents noted on the lateral view.  IMPRESSION: No active cardiopulmonary disease.   Electronically Signed   By: Ruel Favors M.D.   On: 11/07/2013 12:29    EKG Interpretation   None      Date: 11/08/2013  Rate: 65  Rhythm: normal sinus rhythm  QRS Axis: normal  Intervals: normal  ST/T Wave abnormalities: normal  Conduction Disutrbances:none  Narrative Interpretation: No ST or T wave changes cw ischemia  Old EKG Reviewed: unchanged    MDM   1. Atypical chest pain   2. Shoulder contusion, left, initial encounter    12:56 PM 62 y.o. male with multiple risk factors for cardiac disease status post PCI who presents  with intermittent left arm and neck pain for the last 2-3 days. The patient also notes a fullness in the left upper chest which is also intermittent. He does not have this fullness on exam.  He was recently admitted for atypical chest pain and a viral-like syndrome. He was evaluated by cardiology at that time. He is afebrile and vital signs are unremarkable here today. He complains of left arm pain which is reproducible with palpation and range of motion on exam. Will get screening labwork and morphine for pain. He is requesting something stronger than the Percocet which he takes at home. The pt also fell onto the left shoulder recently and recently moved and has been moving heavy objects.    Pt's pain controlled. Likely MSK in nature as it is easily reproduced on exam. Delta trop neg. Pt requesting percocet Rx as he was using more during his recent illness and cannot refill his Rx until this Thursday. Will provide a small Rx of percocet until he gets his new Rx.  I have discussed the diagnosis/risks/treatment options with the patient and believe the pt to be eligible for discharge home to follow-up with his pcp for continued pain. We also discussed returning to the ED immediately if new or worsening sx occur. We discussed the sx which are most concerning (e.g., worsening pain, sob, fever) that necessitate immediate return. Any new prescriptions provided to the patient are listed below.      Junius Argyle, MD 11/08/13 1131

## 2013-11-07 NOTE — ED Notes (Addendum)
Pt states that he has had cp x 1 1/2 days and states that his left arm hurt has been nauseated some sob has hx of cardiac stent states quit cocaine but smokeing has been very hard to give up

## 2013-11-10 ENCOUNTER — Encounter (HOSPITAL_COMMUNITY): Payer: Self-pay | Admitting: Emergency Medicine

## 2013-11-10 ENCOUNTER — Telehealth: Payer: Self-pay | Admitting: *Deleted

## 2013-11-10 ENCOUNTER — Observation Stay (HOSPITAL_COMMUNITY)
Admission: EM | Admit: 2013-11-10 | Discharge: 2013-11-11 | Disposition: A | Discharge status: Home / Self Care | Payer: Medicaid Other | Attending: Internal Medicine | Admitting: Internal Medicine

## 2013-11-10 ENCOUNTER — Emergency Department (HOSPITAL_COMMUNITY): Payer: Medicaid Other

## 2013-11-10 ENCOUNTER — Ambulatory Visit: Payer: Medicaid Other | Admitting: Nurse Practitioner

## 2013-11-10 ENCOUNTER — Telehealth: Payer: Self-pay | Admitting: Physician Assistant

## 2013-11-10 DIAGNOSIS — Z7982 Long term (current) use of aspirin: Secondary | ICD-10-CM | POA: Insufficient documentation

## 2013-11-10 DIAGNOSIS — Z7902 Long term (current) use of antithrombotics/antiplatelets: Secondary | ICD-10-CM | POA: Insufficient documentation

## 2013-11-10 DIAGNOSIS — Z87440 Personal history of urinary (tract) infections: Secondary | ICD-10-CM | POA: Insufficient documentation

## 2013-11-10 DIAGNOSIS — R079 Chest pain, unspecified: Secondary | ICD-10-CM | POA: Insufficient documentation

## 2013-11-10 DIAGNOSIS — Z720 Tobacco use: Secondary | ICD-10-CM

## 2013-11-10 DIAGNOSIS — R0789 Other chest pain: Secondary | ICD-10-CM

## 2013-11-10 DIAGNOSIS — R0602 Shortness of breath: Principal | ICD-10-CM | POA: Insufficient documentation

## 2013-11-10 DIAGNOSIS — F329 Major depressive disorder, single episode, unspecified: Secondary | ICD-10-CM

## 2013-11-10 DIAGNOSIS — M79609 Pain in unspecified limb: Secondary | ICD-10-CM

## 2013-11-10 DIAGNOSIS — R0600 Dyspnea, unspecified: Secondary | ICD-10-CM

## 2013-11-10 DIAGNOSIS — G40909 Epilepsy, unspecified, not intractable, without status epilepticus: Secondary | ICD-10-CM

## 2013-11-10 DIAGNOSIS — I129 Hypertensive chronic kidney disease with stage 1 through stage 4 chronic kidney disease, or unspecified chronic kidney disease: Secondary | ICD-10-CM | POA: Insufficient documentation

## 2013-11-10 DIAGNOSIS — E785 Hyperlipidemia, unspecified: Secondary | ICD-10-CM | POA: Insufficient documentation

## 2013-11-10 DIAGNOSIS — Z880 Allergy status to penicillin: Secondary | ICD-10-CM | POA: Insufficient documentation

## 2013-11-10 DIAGNOSIS — F17200 Nicotine dependence, unspecified, uncomplicated: Secondary | ICD-10-CM | POA: Insufficient documentation

## 2013-11-10 DIAGNOSIS — Z87442 Personal history of urinary calculi: Secondary | ICD-10-CM | POA: Insufficient documentation

## 2013-11-10 DIAGNOSIS — R531 Weakness: Secondary | ICD-10-CM

## 2013-11-10 DIAGNOSIS — R58 Hemorrhage, not elsewhere classified: Secondary | ICD-10-CM

## 2013-11-10 DIAGNOSIS — M129 Arthropathy, unspecified: Secondary | ICD-10-CM | POA: Insufficient documentation

## 2013-11-10 DIAGNOSIS — R001 Bradycardia, unspecified: Secondary | ICD-10-CM

## 2013-11-10 DIAGNOSIS — I739 Peripheral vascular disease, unspecified: Secondary | ICD-10-CM | POA: Insufficient documentation

## 2013-11-10 DIAGNOSIS — E237 Disorder of pituitary gland, unspecified: Secondary | ICD-10-CM | POA: Insufficient documentation

## 2013-11-10 DIAGNOSIS — I2589 Other forms of chronic ischemic heart disease: Secondary | ICD-10-CM | POA: Insufficient documentation

## 2013-11-10 DIAGNOSIS — Z79899 Other long term (current) drug therapy: Secondary | ICD-10-CM | POA: Insufficient documentation

## 2013-11-10 DIAGNOSIS — E119 Type 2 diabetes mellitus without complications: Secondary | ICD-10-CM | POA: Insufficient documentation

## 2013-11-10 DIAGNOSIS — R42 Dizziness and giddiness: Secondary | ICD-10-CM

## 2013-11-10 DIAGNOSIS — I2510 Atherosclerotic heart disease of native coronary artery without angina pectoris: Secondary | ICD-10-CM

## 2013-11-10 DIAGNOSIS — M79602 Pain in left arm: Secondary | ICD-10-CM

## 2013-11-10 DIAGNOSIS — Z23 Encounter for immunization: Secondary | ICD-10-CM | POA: Insufficient documentation

## 2013-11-10 DIAGNOSIS — I214 Non-ST elevation (NSTEMI) myocardial infarction: Secondary | ICD-10-CM

## 2013-11-10 DIAGNOSIS — M25529 Pain in unspecified elbow: Secondary | ICD-10-CM | POA: Insufficient documentation

## 2013-11-10 DIAGNOSIS — N189 Chronic kidney disease, unspecified: Secondary | ICD-10-CM | POA: Insufficient documentation

## 2013-11-10 DIAGNOSIS — I70219 Atherosclerosis of native arteries of extremities with intermittent claudication, unspecified extremity: Secondary | ICD-10-CM

## 2013-11-10 DIAGNOSIS — M25519 Pain in unspecified shoulder: Secondary | ICD-10-CM | POA: Insufficient documentation

## 2013-11-10 DIAGNOSIS — I714 Abdominal aortic aneurysm, without rupture: Secondary | ICD-10-CM

## 2013-11-10 DIAGNOSIS — M7989 Other specified soft tissue disorders: Secondary | ICD-10-CM | POA: Insufficient documentation

## 2013-11-10 LAB — BASIC METABOLIC PANEL
BUN: 7 mg/dL (ref 6–23)
CO2: 25 mEq/L (ref 19–32)
Calcium: 9.3 mg/dL (ref 8.4–10.5)
Creatinine, Ser: 0.62 mg/dL (ref 0.50–1.35)
GFR calc non Af Amer: >90 mL/min (ref >90)
Sodium: 133 mEq/L — ABNORMAL LOW (ref 135–145)

## 2013-11-10 LAB — CBC WITH DIFFERENTIAL/PLATELET
Basophils Absolute: 0.1 10*3/uL (ref 0.0–0.1)
Eosinophils Absolute: 0.2 10*3/uL (ref 0.0–0.7)
Eosinophils Relative: 3 % (ref 0–5)
HCT: 40.8 % (ref 39.0–52.0)
Hemoglobin: 14.2 g/dL (ref 13.0–17.0)
Lymphocytes Relative: 40 % (ref 12–46)
MCH: 32 pg (ref 26.0–34.0)
MCHC: 34.8 g/dL (ref 30.0–36.0)
MCV: 91.9 fL (ref 78.0–100.0)
Monocytes Absolute: 0.4 10*3/uL (ref 0.1–1.0)
Platelets: ADEQUATE 10*3/uL (ref 150–400)
RDW: 13.6 % (ref 11.5–15.5)
WBC: 5.3 10*3/uL (ref 4.0–10.5)

## 2013-11-10 LAB — GLUCOSE, CAPILLARY: Glucose-Capillary: 133 mg/dL — ABNORMAL HIGH (ref 70–99)

## 2013-11-10 LAB — PRO B NATRIURETIC PEPTIDE: Pro B Natriuretic peptide (BNP): 32.9 pg/mL (ref 0–125)

## 2013-11-10 LAB — TROPONIN I: Troponin I: <0.3 ng/mL (ref <0.30)

## 2013-11-10 MED ORDER — ZOLPIDEM TARTRATE 5 MG PO TABS: 5.0000 mg | ORAL_TABLET | Freq: Every evening | ORAL | Status: DC | PRN

## 2013-11-10 MED ORDER — PROMETHAZINE HCL 12.5 MG PO TABS: 12.5000 mg | ORAL_TABLET | Freq: Four times a day (QID) | ORAL | Status: DC | PRN

## 2013-11-10 MED ORDER — PHENYTOIN SODIUM EXTENDED 100 MG PO CAPS
300.0000 mg | ORAL_CAPSULE | Freq: Every day | ORAL | Status: DC
  Administered 2013-11-11: 300 mg via ORAL
  Filled 2013-11-10 (×3): qty 3

## 2013-11-10 MED ORDER — ASPIRIN 325 MG PO TABS
325.0000 mg | ORAL_TABLET | Freq: Once | ORAL | Status: AC
  Administered 2013-11-10: 325 mg via ORAL
  Filled 2013-11-10: qty 1

## 2013-11-10 MED ORDER — SODIUM CHLORIDE 0.9 % IV BOLUS (SEPSIS)
1000.0000 mL | Freq: Once | INTRAVENOUS | Status: AC
  Administered 2013-11-10: 1000 mL via INTRAVENOUS

## 2013-11-10 MED ORDER — INSULIN ASPART 100 UNIT/ML ~~LOC~~ SOLN
0.0000 [IU] | Freq: Three times a day (TID) | SUBCUTANEOUS | Status: DC
  Administered 2013-11-11: 2 [IU] via SUBCUTANEOUS

## 2013-11-10 MED ORDER — INFLUENZA VAC SPLIT QUAD 0.5 ML IM SUSP
0.5000 mL | INTRAMUSCULAR | Status: AC
  Administered 2013-11-11: 0.5 mL via INTRAMUSCULAR
  Filled 2013-11-10 (×2): qty 0.5

## 2013-11-10 MED ORDER — NITROGLYCERIN 0.4 MG SL SUBL: 0.4000 mg | SUBLINGUAL_TABLET | SUBLINGUAL | Status: DC | PRN

## 2013-11-10 MED ORDER — GLIPIZIDE 10 MG PO TABS
10.0000 mg | ORAL_TABLET | Freq: Two times a day (BID) | ORAL | Status: DC
  Administered 2013-11-11: 10 mg via ORAL
  Filled 2013-11-10 (×3): qty 1

## 2013-11-10 MED ORDER — ENOXAPARIN SODIUM 40 MG/0.4ML ~~LOC~~ SOLN
40.0000 mg | SUBCUTANEOUS | Status: DC
  Administered 2013-11-11: 40 mg via SUBCUTANEOUS
  Filled 2013-11-10 (×2): qty 0.4

## 2013-11-10 MED ORDER — ASPIRIN EC 81 MG PO TBEC
81.0000 mg | DELAYED_RELEASE_TABLET | Freq: Every day | ORAL | Status: DC
  Administered 2013-11-11: 81 mg via ORAL
  Filled 2013-11-10 (×2): qty 1

## 2013-11-10 MED ORDER — PHENYTOIN SODIUM EXTENDED 100 MG PO CAPS
200.0000 mg | ORAL_CAPSULE | Freq: Every day | ORAL | Status: DC
  Administered 2013-11-11: 200 mg via ORAL
  Filled 2013-11-10 (×2): qty 2

## 2013-11-10 MED ORDER — ACETAMINOPHEN 325 MG PO TABS: 650.0000 mg | ORAL_TABLET | ORAL | Status: DC | PRN

## 2013-11-10 MED ORDER — MORPHINE SULFATE 2 MG/ML IJ SOLN
2.0000 mg | INTRAMUSCULAR | Status: DC | PRN
  Administered 2013-11-11 (×2): 2 mg via INTRAVENOUS
  Filled 2013-11-10 (×2): qty 1

## 2013-11-10 MED ORDER — HYDROMORPHONE HCL PF 1 MG/ML IJ SOLN: 1.0000 mg | Freq: Once | INTRAMUSCULAR | Status: DC

## 2013-11-10 MED ORDER — INSULIN ASPART 100 UNIT/ML ~~LOC~~ SOLN: 0.0000 [IU] | Freq: Every day | SUBCUTANEOUS | Status: DC

## 2013-11-10 MED ORDER — CLOPIDOGREL BISULFATE 75 MG PO TABS
75.0000 mg | ORAL_TABLET | Freq: Every day | ORAL | Status: DC
  Administered 2013-11-11: 75 mg via ORAL

## 2013-11-10 MED ORDER — ONDANSETRON HCL 4 MG/2ML IJ SOLN: 4.0000 mg | Freq: Four times a day (QID) | INTRAMUSCULAR | Status: DC | PRN

## 2013-11-10 MED ORDER — HYDROMORPHONE HCL PF 1 MG/ML IJ SOLN
0.5000 mg | Freq: Once | INTRAMUSCULAR | Status: AC
  Administered 2013-11-10: 0.5 mg via INTRAVENOUS
  Filled 2013-11-10: qty 1

## 2013-11-10 MED ORDER — OXYCODONE-ACETAMINOPHEN 5-325 MG PO TABS
1.0000 | ORAL_TABLET | ORAL | Status: DC | PRN
  Administered 2013-11-10 – 2013-11-11 (×2): 1 via ORAL
  Filled 2013-11-10 (×2): qty 1

## 2013-11-10 MED ORDER — MORPHINE SULFATE 4 MG/ML IJ SOLN
4.0000 mg | Freq: Once | INTRAMUSCULAR | Status: AC
  Administered 2013-11-10: 4 mg via INTRAVENOUS
  Filled 2013-11-10: qty 1

## 2013-11-10 MED ORDER — ACETAMINOPHEN 500 MG PO TABS: 1000.0000 mg | ORAL_TABLET | Freq: Four times a day (QID) | ORAL | Status: DC | PRN

## 2013-11-10 MED ORDER — POLYETHYLENE GLYCOL 3350 17 G PO PACK
17.0000 g | PACK | Freq: Every day | ORAL | Status: DC | PRN
  Filled 2013-11-10: qty 1

## 2013-11-10 NOTE — ED Notes (Addendum)
Pt states he's had L arm pain and SOB for 3 days.  Pt seen on 12/14 for same complaint.  Pt moves arm freely and in NAD.  Pt rates pain as 8/10.  Pt states he fell on the arm about a month ago.

## 2013-11-10 NOTE — ED Notes (Signed)
Pt returned from [facility].

## 2013-11-10 NOTE — ED Notes (Signed)
Pt requesting pain medication.  

## 2013-11-10 NOTE — Telephone Encounter (Signed)
Pt called into [facility] in [facility] this morning complaining of: flu like symptoms x 3 weeks; CP for 2/3 days; 1 nitroglycerin last night --- discussed with Dr. Graciela Husbands --- pt advised to go to the ED. Stacy from [facility] was going to call the patient back and forward recommendations to the patient.

## 2013-11-10 NOTE — Telephone Encounter (Signed)
Pt called because he states has been having chest left arm pain, SOB and LE edema. Pt was in the ER 11/07/13. With the same symptoms. An EKG, Enzymes were collected  And they were fine. Pt states was seen by the PCP which tried to called to speak with Dr Excell Seltzer about pt's symptoms but was unable to do so. Pt states the last night pt has a very difficult for him to sleep. According to Norma Fredrickson , Dory Horn RN, DR. Klein's nurse tolled pt that he needs to go to the ER. Per DR. Klein's recommendations. Pt aware he verbalized understanding.

## 2013-11-10 NOTE — ED Notes (Signed)
EKG completed given to EDP.  

## 2013-11-10 NOTE — Telephone Encounter (Signed)
New Problem:  Pt is c/o of pain in his left arm, chest pain and SOB

## 2013-11-10 NOTE — ED Provider Notes (Signed)
Pt signed out to me by Raymon Mutton, PA-C at shift change.  BNP and 2nd troponin pending.    If negative, pt may be discharged home with PCP f/u.  Pt has percocet at home.    7:21 PM: checked on progress of BNP and troponin.   Lab has not been able to find.  Will redraw and run.  7:33 PM: pt requesting more pain medication, stating morphine does not work for him, he "needs" dilaudid. Denies SOB at this time but pt questioning if he may have the flu.  Due to pt's significant PMH for CAD including extensive PVD and this being pt's 2nd time to ED for CP since 12/14 will call hospitalist to admit to observation for CP r/o.  Pt does not have cardiologist for f/u.  Troponin and BNP still pending.   Will admit pt to tele obs, attending: Dr. Allena Katz, internal medicine.       Junius Finner, PA-C 11/10/13 2043

## 2013-11-10 NOTE — ED Notes (Addendum)
Spoke with lab stated sees order for troponin and BNP and was collected however does not currently have specimen.  Spoke to phlebotomy and will draw right now and is currently at bedside drawing blood.

## 2013-11-10 NOTE — ED Provider Notes (Signed)
CSN: [id number]     Arrival date & time 11/10/13  1136 History   First MD Initiated Contact with Patient 11/10/13 1412     Chief Complaint  Patient presents with  . Shortness of Breath  . Arm Pain   (Consider location/radiation/quality/duration/timing/severity/associated sxs/prior Treatment) The history is provided by the patient. No language interpreter was used.  [name] [name] is a 62 y/o M with PMHx of DM, HLD, HTN, arthritis, leg pain, seizure disorder, peripheral vascular disease with history of bilateral femoral bypass graft, CAD with NSTEMI in7/2014, chronic kidney disease presenting to the ED with shortness of breath and chest pain that has been ongoing for the past 3 days. As per patient reported that the chest pain is in the center of his chest and described as a pressure sensation, tightness. Patient reported that the chest pain occurs both at rest and with activity. Patient reported that he started to experience shortness of breath, reported that it occurred when he was at rest. Stated that he has been getting tired easily and feeling weak. Stated that he noticed bilateral ankle swelling today that made him nervous. Patient reported that he fell on his left shoulder approximately one month ago and reported that he has been having shoulder pain described as an aching sensation with radiation down the left arm to the elbow. Patient reported that he started to develop left elbow pain has intensified today, reported a sharp aching sensation with and without motion. Patient reported that he called cardiology, [facility], reported symptoms and the office recommended that the patient come into the office to see them - stated that he has not seen [facility] just yet, reported that he was not followed up with them as an outpatient. Patient reported that the office called him back and stated that they were unable to fit him into the schedule and recommended the patient come to the ED. Denied loss of  sensation, weakness, neck pain, back pain, recent fall or injury, sweating, nausea, vomiting, diarrhea, melena, hematochezia, dizziness, headache.  PCP Dr. Reche Dixon  Past Medical History  Diagnosis Date  . Diabetes mellitus   . Hyperlipidemia   . Hypertension   . Arthritis   . Leg pain   . Seizure disorder   . Peripheral vascular disease     s/p prior Ao-Bifem BPG  . 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 Coronary artery disease     a. NSTEMI 7/14=> LHC 06/07/13: Proximal LAD 30%, ostial D1 30%, AV circumflex 80%, then occluded, ostial OM2 occluded, left to left collaterals, mid RCA 95%, inferior HK, EF 60%. PCI: Promus DES to the mid RCA.  No intervention was recommended for the CTO circumflex.  . Chronic kidney disease     hx UTI, and kidney stones  . Ischemic cardiomyopathy     a. Echocardiogram 06/06/13: EF 35-40%, inferior AK, mild MR.   Past Surgical History  Procedure Laterality Date  . Thrombectomy      left to right fem-fem bypass  . Hernia repair    . Aortogram with iliac artery stenting  05/06/2011  . Tonsillectomy    . Aorta - bilateral femoral artery bypass graft  11/25/2011    Procedure: AORTA BIFEMORAL BYPASS GRAFT;  Surgeon: Larina Earthly, MD;  Location: [facility] OR;  Service: Vascular;  Laterality: N/A;  . Pr vein bypass graft,aorto-fem-pop  11/24/12   Family History  Problem Relation Age of Onset  .  Hypertension Mother   . Heart attack Mother   . Diabetes Mother   . Aneurysm Father   . Heart attack Sister   . Heart disease Brother     heart transplant   History  Substance Use Topics  . 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 Smokeless tobacco: Not on file  . Alcohol Use: No     Comment: alcohol abuse in the past    Review of Systems  Constitutional: Negative for fever and chills.  Respiratory: Positive for shortness of breath. Negative for chest tightness.    Cardiovascular: Positive for leg swelling (bilateral leg swelling). Negative for chest pain.  Gastrointestinal: Negative for nausea, vomiting, abdominal pain and diarrhea.  Musculoskeletal: Positive for arthralgias (left shoulder and elbow pain ).  Neurological: Negative for dizziness and weakness.  All other systems reviewed and are negative.    Allergies  Penicillins and Zetia  Home Medications   Current Outpatient Rx  Name  Route  Sig  Dispense  Refill  . acetaminophen (TYLENOL) 500 MG tablet   Oral   Take 1,000 mg by mouth every 6 (six) hours as needed for moderate pain.         Marland Kitchen aspirin EC 81 MG tablet   Oral   Take 81 mg by mouth daily.         . clopidogrel (PLAVIX) 75 MG tablet   Oral   Take 75 mg by mouth daily with breakfast.         . glipiZIDE (GLUCOTROL) 10 MG tablet   Oral   Take 10 mg by mouth 2 (two) times daily before a meal.         . guaiFENesin-dextromethorphan (ROBITUSSIN DM) 100-10 MG/5ML syrup   Oral   Take 10 mLs by mouth every 4 (four) hours as needed for cough.         . nitroGLYCERIN (NITROSTAT) 0.4 MG SL tablet   Sublingual   Place 0.4 mg under the tongue every 5 (five) minutes as needed for chest pain.         Marland Kitchen oxyCODONE-acetaminophen (PERCOCET) 5-325 MG per tablet   Oral   Take 1 tablet by mouth every 6 (six) hours as needed.   15 tablet   0   . phenytoin (DILANTIN) 100 MG ER capsule   Oral   Take 200-300 mg by mouth 2 (two) times daily. Take 200 mg in the morning & 300 mg in the evening.         . polyethylene glycol (MIRALAX / GLYCOLAX) packet   Oral   Take 17 g by mouth daily as needed.         . promethazine (PHENERGAN) 12.5 MG tablet   Oral   Take 1 tablet (12.5 mg total) by mouth every 6 (six) hours as needed for nausea or vomiting.   30 tablet   0   . triamcinolone cream (KENALOG) 0.1 %   Topical   Apply 1 application topically 2 (two) times daily as needed (for eczema).         . zolpidem  (AMBIEN) 5 MG tablet   Oral   Take 5 mg by mouth at bedtime as needed. For sleep          BP 144/84  Pulse 56  Temp(Src) 97.6 F (36.4 C) (Oral)  Resp 17  Ht 6\' 3"  (1.905 m)  Wt 157 lb 4.8 oz (71.351  kg)  BMI 19.66 kg/m2  SpO2 95% Physical Exam  Nursing note and vitals reviewed. Constitutional: He is oriented to person, place, and time. He appears well-developed and well-nourished. No distress.  HENT:  Head: Normocephalic and atraumatic.  Eyes: Conjunctivae and EOM are normal. Pupils are equal, round, and reactive to light.  Neck: Normal range of motion. Neck supple.  Negative neck stiffness Negative nuchal rigidity Negative pain upon palpation to the c-spine and bilateral aspects of the neck   Cardiovascular: Normal rate, regular rhythm and normal heart sounds.  Exam reveals no friction rub.   No murmur heard. Pulses:      Radial pulses are 2+ on the right side, and 2+ on the left side.       Dorsalis pedis pulses are 1+ on the right side, and 1+ on the left side.  Decreased pulses to bilateral DP - patient reported that he has had that for years Negative pallor, ulcer, noted Negative swelling, pitting edema noted to BLE  Pulmonary/Chest: Effort normal and breath sounds normal. No respiratory distress. He has no wheezes. He exhibits no tenderness.  Airway intact Patient able to speak in full sentences without difficulty  Negative respiratory distress noted  Musculoskeletal: Normal range of motion. He exhibits no tenderness.  Negative swelling, erythema, inflammation, lesions, sores, deformities, noted to the left shoulder and left elbow. Mild discomfort upon palpation to the left deltoid and left mid-forearm. Full ROM noted to the left shoulder, left elbow, left wrist, left hand and digits without difficulty noted.   Neurological: He is alert and oriented to person, place, and time. He exhibits normal muscle tone. Coordination normal.  Strength 5+/5+ to upper and lower  extremities bilaterally with resistance applied, equal distribution noted.  Strength intact to MCP, PIP, DIP joints of the left hand Sensation intact   Skin: Skin is warm and dry. No rash noted. He is not diaphoretic. No erythema.  Psychiatric: He has a normal mood and affect. His behavior is normal.    ED Course  Procedures (including critical care time)  This provider reviewed the patient's chart. Patient was seen in the ED setting on 11/07/2013 for similar complaints - stated that he was suffering from shortness of breath, chest pain, and left shoulder pain. EKG negative findings noted, two troponins with negative elevation. Patient was discharged with recommendation to follow-up with cardiology as outpatient. Patient was seen in the ED and admitted to the hospital regarding chest pain and shortness of breath in 10/23/2013 - multiple troponins performed with negative elevation. Patient was consulted by cardiology.   Results for orders placed during the hospital encounter of 11/10/13  CBC WITH DIFFERENTIAL      Result Value Range   WBC 5.3  4.0 - 10.5 K/uL   RBC 4.44  4.22 - 5.81 MIL/uL   Hemoglobin 14.2  13.0 - 17.0 g/dL   HCT 45.4  09.8 - 11.9 %   MCV 91.9  78.0 - 100.0 fL   MCH 32.0  26.0 - 34.0 pg   MCHC 34.8  30.0 - 36.0 g/dL   RDW 14.7  82.9 - 56.2 %   Platelets    150 - 400 K/uL   Value: PLATELET CLUMPS NOTED ON SMEAR, COUNT APPEARS ADEQUATE   Neutrophils Relative % 49  43 - 77 %   Neutro Abs 2.6  1.7 - 7.7 K/uL   Lymphocytes Relative 40  12 - 46 %   Lymphs Abs 2.1  0.7 - 4.0 K/uL  Monocytes Relative 7  3 - 12 %   Monocytes Absolute 0.4  0.1 - 1.0 K/uL   Eosinophils Relative 3  0 - 5 %   Eosinophils Absolute 0.2  0.0 - 0.7 K/uL   Basophils Relative 1  0 - 1 %   Basophils Absolute 0.1  0.0 - 0.1 K/uL  BASIC METABOLIC PANEL      Result Value Range   Sodium 133 (*) 135 - 145 mEq/L   Potassium 4.1  3.5 - 5.1 mEq/L   Chloride 97  96 - 112 mEq/L   CO2 25  19 - 32 mEq/L    Glucose, Bld 185 (*) 70 - 99 mg/dL   BUN 7  6 - 23 mg/dL   Creatinine, Ser 1.61  0.50 - 1.35 mg/dL   Calcium 9.3  8.4 - 09.6 mg/dL   GFR calc non Af Amer >90  >90 mL/min   GFR calc Af Amer >90  >90 mL/min  POCT I-STAT TROPONIN I      Result Value Range   Troponin i, poc 0.00  0.00 - 0.08 ng/mL   Comment 3            Dg Chest 2 View  11/10/2013   CLINICAL DATA:  Shortness of breath  EXAM: CHEST  2 VIEW  COMPARISON:  11/07/2013  FINDINGS: The heart size and mediastinal contours are within normal limits. Both lungs are clear. The visualized skeletal structures are unremarkable. Stable hyperinflation. Coronary stents noted on the lateral view. Remote healed right anterior rib fractures.  IMPRESSION: Stable hyperinflation.  No acute chest process.   Electronically Signed   By: Ruel Favors M.D.   On: 11/10/2013 15:25   Dg Chest 2 View  11/07/2013   CLINICAL DATA:  Left arm pain, chest pain  EXAM: CHEST  2 VIEW  COMPARISON:  11/01/2013  FINDINGS: The heart size and mediastinal contours are within normal limits. Both lungs are clear. The visualized skeletal structures are unremarkable. Coronary stents noted on the lateral view.  IMPRESSION: No active cardiopulmonary disease.   Electronically Signed   By: Ruel Favors M.D.   On: 11/07/2013 12:29   Dg Chest 2 View  10/23/2013   CLINICAL DATA:  Chest pain, shortness of breath  EXAM: CHEST  2 VIEW  COMPARISON:  07/02/2013  FINDINGS: Lungs are clear. No pleural effusion or pneumothorax.  The heart is normal in size.  Mild degenerative changes of the visualized thoracolumbar spine.  IMPRESSION: No evidence of acute cardiopulmonary disease.   Electronically Signed   By: Charline Bills M.D.   On: 10/23/2013 17:49   Dg Elbow Complete Left  11/10/2013   CLINICAL DATA:  Pain  EXAM: LEFT ELBOW - COMPLETE 3+ VIEW  COMPARISON:  None.  FINDINGS: There is no evidence of fracture, dislocation, or malalignment. Areas of osteophytosis identified along the  periphery of the olecranon. The soft tissues are unremarkable.  IMPRESSION: Osteoarthritic changes without evidence of acute osseous abnormalities.   Electronically Signed   By: Salome Holmes M.D.   On: 11/10/2013 15:27   Ct Angio Chest Pe W/cm &/or Wo Cm  10/24/2013   CLINICAL DATA:  Chest pain.  Elevated D-dimer.  EXAM: CT ANGIOGRAPHY CHEST WITH CONTRAST  TECHNIQUE: Multidetector CT imaging of the chest was performed using the standard protocol during bolus administration of intravenous contrast. Multiplanar CT image reconstructions including MIPs were obtained to evaluate the vascular anatomy.  CONTRAST:  80mL OMNIPAQUE IOHEXOL 350 MG/ML SOLN  COMPARISON:  Chest x-ray 10/23/2013.  CT chest 12/06/2011.  FINDINGS: No evidence of pulmonary embolus. Pulmonary arteries are normal. Heart size is normal. Coronary artery calcification. No pericardial effusion. Thoracic aorta normal caliber.  Large airways are widely patent. Tiny punctate densities noted within the mainstem bronchi most likely represent small foci of mucous. No focal alveolar infiltrate. No focal pulmonary mass lesion noted. Mild passive atelectasis noted. Minimal nodularity in the pleural surfaces and mild interstitial prominence is stable most consistent with fibrosis. Evidence of prior granulomatous disease.  Visualized upper abdominal organs are unremarkable. Chest wall is intact. Stable deformity is noted of the left sternoclavicular joint. This may be degenerative no displaced rib fracture noted.  Review of the MIP images confirms the above findings.  IMPRESSION: No evidence of pulmonary embolus. No acute abnormality. Coronary artery disease.   Electronically Signed   By: Maisie Fus  Register   On: 10/24/2013 15:27     Labs Review Labs Reviewed  BASIC METABOLIC PANEL - Abnormal; Notable for the following:    Sodium 133 (*)    Glucose, Bld 185 (*)    All other components within normal limits  CBC WITH DIFFERENTIAL  TROPONIN I  PRO B  NATRIURETIC PEPTIDE  POCT I-STAT TROPONIN I   Imaging Review Dg Chest 2 View  11/10/2013   CLINICAL DATA:  Shortness of breath  EXAM: CHEST  2 VIEW  COMPARISON:  11/07/2013  FINDINGS: The heart size and mediastinal contours are within normal limits. Both lungs are clear. The visualized skeletal structures are unremarkable. Stable hyperinflation. Coronary stents noted on the lateral view. Remote healed right anterior rib fractures.  IMPRESSION: Stable hyperinflation.  No acute chest process.   Electronically Signed   By: Ruel Favors M.D.   On: 11/10/2013 15:25   Dg Elbow Complete Left  11/10/2013   CLINICAL DATA:  Pain  EXAM: LEFT ELBOW - COMPLETE 3+ VIEW  COMPARISON:  None.  FINDINGS: There is no evidence of fracture, dislocation, or malalignment. Areas of osteophytosis identified along the periphery of the olecranon. The soft tissues are unremarkable.  IMPRESSION: Osteoarthritic changes without evidence of acute osseous abnormalities.   Electronically Signed   By: Salome Holmes M.D.   On: 11/10/2013 15:27    EKG Interpretation   None      Date: 11/11/2013  Rate: 47  Rhythm: sinus bradycardia  QRS Axis: right  Intervals: normal  ST/T Wave abnormalities: normal  Conduction Disutrbances:none  Narrative Interpretation:   Old EKG Reviewed: unchanged EKG analyzed and reviewed by this provider and attending physician.    MDM  No diagnosis found.   Filed Vitals:   11/10/13 1715 11/10/13 1730 11/10/13 1745 11/10/13 1927  BP: 134/86 124/83 144/84 120/69  Pulse: 49 51 56 55  Temp:      TempSrc:      Resp: 16 16 17 14   Height:      Weight:      SpO2: 99% 98% 95% 99%   Patient presenting to the ED with chest pain, shortness of breath for the past 3 days, left shoulder /elbow pain that has been ongoing for the past 3 days - reported that he fell on his left shoulder approximately one month ago and continues to have shoulder and arm discomfort with worsening pain in the left elbow.  Reported that he noticed bilateral ankle swelling today.  Alert and oriented. GCS 15. Heart rate and rhythm normal. Radial pulses 2+ bilaterally. DP mildly diminished - patient has history of PVD with  bilateral femoral BPG. Negative swelling or pitting edema noted to the ankles bilaterally. Strength intact. Sensation intact. EKG noted bradycardia - negative acute abnormalities or ischemic findings/changes noted. Reviewed patient's chart patient has bradycardia consistent in other EKGs. I-stat troponin negative elevation. CBC negative findings. CMP noted mild hyponatremia of 133 - patient placed on fluids - negative elevation in BUN and Cr. Chest xray negative acute cardiopulmonary disease. Left elbow plain film noted OA - negative acute abnormalities noted.  Troponin and BNP ordered and pending. Patient placed on cardiac monitoring and pulse oximetry. Pain medications administered for pain control. Doubt PE - patient recently had CT angio of chest on 10/23/2013 with negative findings for PE. Suspicion high to be atypical chest pain - patient has a strong history and has not been following up as an outpatient. Recommend admission to Telemetry, observation due to patient's significant PMHx and second visit for similar complaint this week. Discussed case with Junius Finner PA-C at change in shift, transfer of care to [facility] at change in shift.     Raymon Mutton, PA-C 11/11/13 [phone]

## 2013-11-10 NOTE — H&P (Signed)
@LOGO @ [name] History and Physical  Patient: [name] [name]  MVH:[id number]  DOB: 1951/08/09  DOS: the patient was seen and examined on 11/10/2013 PCP: Mia Creek, MD  Chief Complaint: Shortness of breath  HPI: [name] [name] is a 62 y.o. male with Past medical history of coronary artery disease, hypertension, dyslipidemia, peripheral vascular disease, ischemic cardiomyopathy. The patient is coming from home. The patient is presenting with complaints of shortness of breath that has been ongoing since last one week. He mentions of the shortness of breath primarily comes on exertion and since last to 3 days has been occurring even at rest and therefore he decided to come to the hospital. 2 week ago he had upper respiratory symptoms and was given Robitussin for the same. He denies any fever but he does mention about a cough with brownish expectoration. He denies any dizziness or lightheadedness. He does mention about orthopnea and PND. Since last night he started having complaints of pain in his shoulder and also in his elbow, the pain was felt like a sharp pain. He did some nitroglycerin and slept and woke up with worsening pain and therefore he called his cardiology who recommended the patient to come to the ED. Pt denies any fever, chills, headache,palpitation,nausea, vomiting, abdominal pain, diarrhea, constipation, active bleeding, burning urination, dizziness, pedal edema,  focal neurological deficit.   Review of Systems: as mentioned in the history of present illness.  A Comprehensive review of the other systems is negative.  Past Medical History  Diagnosis Date  . Hyperlipidemia   . Hypertension   . Leg pain   . Peripheral vascular disease     s/p prior Ao-Bifem BPG  . 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 Coronary artery disease     a. NSTEMI 7/14=> LHC 06/07/13: Proximal LAD 30%, ostial D1 30%, AV circumflex 80%,  then occluded, ostial OM2 occluded, left to left collaterals, mid RCA 95%, inferior HK, EF 60%. PCI: Promus DES to the mid RCA.  No intervention was recommended for the CTO circumflex.  . Ischemic cardiomyopathy     a. Echocardiogram 06/06/13: EF 35-40%, inferior AK, mild MR.  Marland Kitchen Shortness of breath     "can come on at anytime lately; before that it was just w/exertion" (11/10/2013)  . Type II diabetes mellitus   . Grand mal seizure     "last one was ~ 3 yr ago; controlled w/daily RX" (11/10/2013)  . Arthritis     "neck, back, legs" (11/10/2013)  . Kidney stones     "when I was younger; passed w/o treatment" (11/10/2013)   Past Surgical History  Procedure Laterality Date  . Iliac artery stent  05/06/2011  . Tonsillectomy    . Aorta - bilateral femoral artery bypass graft  11/25/2011    Procedure: AORTA BIFEMORAL BYPASS GRAFT;  Surgeon: Larina Earthly, MD;  Location: [facility] OR;  Service: Vascular;  Laterality: N/A;  . Pr vein bypass graft,aorto-fem-pop  11/24/2012  . Inguinal hernia repair Right    Social History:  reports that he has been smoking Cigarettes.  He has a 46 pack-year smoking history. He has never used smokeless tobacco. He reports that he drinks alcohol. He reports that he uses illicit drugs (Marijuana and Cocaine) about once per week. Independent for most of his  ADL.  Allergies  Allergen Reactions  . Penicillins Other (See Comments)    Unknown  . Zetia [  Ezetimibe] Swelling    Family History  Problem Relation Age of Onset  . Hypertension Mother   . Heart attack Mother   . Diabetes Mother   . Aneurysm Father   . Heart attack Sister   . Heart disease Brother     heart transplant    Prior to Admission medications   Medication Sig Start Date End Date Taking? Authorizing Provider  acetaminophen (TYLENOL) 500 MG tablet Take 1,000 mg by mouth every 6 (six) hours as needed for moderate pain.   Yes Historical Provider, MD  aspirin EC 81 MG tablet Take 81 mg by mouth daily.    Yes Historical Provider, MD  clopidogrel (PLAVIX) 75 MG tablet Take 75 mg by mouth daily with breakfast.   Yes Historical Provider, MD  glipiZIDE (GLUCOTROL) 10 MG tablet Take 10 mg by mouth 2 (two) times daily before a meal.   Yes Historical Provider, MD  guaiFENesin-dextromethorphan (ROBITUSSIN DM) 100-10 MG/5ML syrup Take 10 mLs by mouth every 4 (four) hours as needed for cough.   Yes Historical Provider, MD  nitroGLYCERIN (NITROSTAT) 0.4 MG SL tablet Place 0.4 mg under the tongue every 5 (five) minutes as needed for chest pain.   Yes Historical Provider, MD  oxyCODONE-acetaminophen (PERCOCET) 5-325 MG per tablet Take 1 tablet by mouth every 6 (six) hours as needed. 11/07/13  Yes Junius Argyle, MD  phenytoin (DILANTIN) 100 MG ER capsule Take 200-300 mg by mouth 2 (two) times daily. Take 200 mg in the morning & 300 mg in the evening.   Yes Historical Provider, MD  polyethylene glycol (MIRALAX / GLYCOLAX) packet Take 17 g by mouth daily as needed.   Yes Historical Provider, MD  promethazine (PHENERGAN) 12.5 MG tablet Take 1 tablet (12.5 mg total) by mouth every 6 (six) hours as needed for nausea or vomiting. 10/24/13  Yes Ripudeep Jenna Luo, MD  triamcinolone cream (KENALOG) 0.1 % Apply 1 application topically 2 (two) times daily as needed (for eczema).   Yes Historical Provider, MD  zolpidem (AMBIEN) 5 MG tablet Take 5 mg by mouth at bedtime as needed. For sleep   Yes Historical Provider, MD    Physical Exam: Filed Vitals:   11/10/13 1927 11/10/13 1930 11/10/13 2030 11/10/13 2127  BP: 120/69 120/70 134/83 169/88  Pulse: 55 53 67 49  Temp:    97.4 F (36.3 C)  TempSrc:    Oral  Resp: 14 14 17 18   Height:    6\' 3"  (1.905 m)  Weight:    70.035 kg (154 lb 6.4 oz)  SpO2: 99% 98% 97% 100%    General: Alert, Awake and Oriented to Time, Place and Person. Appear in mild distress Eyes: PERRL ENT: Oral Mucosa clear moist. Neck: No JVD Cardiovascular: S1 and S2 Present, no Murmur, Peripheral  Pulses Present Respiratory: Bilateral Air entry equal and Decreased, Clear to Auscultation,  No Crackles, no wheezes Abdomen: Bowel Sound Present, Soft and Non tender Skin: No Rash Extremities: Trace Pedal edema, no calf tenderness Neurologic: Grossly Unremarkable.  Labs on Admission:  CBC:  Recent Labs Lab 11/07/13 1159 11/10/13 1300  WBC 4.3 5.3  NEUTROABS 2.0 2.6  HGB 14.7 14.2  HCT 42.4 40.8  MCV 93.2 91.9  PLT PLATELET CLUMPS NOTED ON SMEAR, COUNT APPEARS ADEQUATE PLATELET CLUMPS NOTED ON SMEAR, COUNT APPEARS ADEQUATE    CMP     Component Value Date/Time   NA 133* 11/10/2013 1300   K 4.1 11/10/2013 1300   CL 97 11/10/2013  1300   CO2 25 11/10/2013 1300   GLUCOSE 185* 11/10/2013 1300   BUN 7 11/10/2013 1300   CREATININE 0.62 11/10/2013 1300   CALCIUM 9.3 11/10/2013 1300   PROT 7.6 11/07/2013 1159   ALBUMIN 4.4 11/07/2013 1159   AST 18 11/07/2013 1159   ALT 16 11/07/2013 1159   ALKPHOS 71 11/07/2013 1159   BILITOT 0.2* 11/07/2013 1159   GFRNONAA >90 11/10/2013 1300   GFRAA >90 11/10/2013 1300    No results found for this basename: LIPASE, AMYLASE,  in the last 168 hours No results found for this basename: AMMONIA,  in the last 168 hours   Recent Labs Lab 11/07/13 1159 11/07/13 1606 11/10/13 1701  TROPONINI <0.30 <0.30 <0.30   BNP (last 3 results)  Recent Labs  06/05/13 2152 06/11/13 1009 11/10/13 1701  PROBNP 367.5* 171.6* 32.9    Radiological Exams on Admission: Dg Chest 2 View  11/10/2013   CLINICAL DATA:  Shortness of breath  EXAM: CHEST  2 VIEW  COMPARISON:  11/07/2013  FINDINGS: The heart size and mediastinal contours are within normal limits. Both lungs are clear. The visualized skeletal structures are unremarkable. Stable hyperinflation. Coronary stents noted on the lateral view. Remote healed right anterior rib fractures.  IMPRESSION: Stable hyperinflation.  No acute chest process.   Electronically Signed   By: Ruel Favors M.D.   On:  11/10/2013 15:25   Dg Elbow Complete Left  11/10/2013   CLINICAL DATA:  Pain  EXAM: LEFT ELBOW - COMPLETE 3+ VIEW  COMPARISON:  None.  FINDINGS: There is no evidence of fracture, dislocation, or malalignment. Areas of osteophytosis identified along the periphery of the olecranon. The soft tissues are unremarkable.  IMPRESSION: Osteoarthritic changes without evidence of acute osseous abnormalities.   Electronically Signed   By: Salome Holmes M.D.   On: 11/10/2013 15:27    EKG: Independently reviewed. normal sinus rhythm, nonspecific ST and T waves changes.  Assessment/Plan Principal Problem:   Shortness of breath Active Problems:   PVD (peripheral vascular disease) with claudication   Dyslipidemia   Type 2 diabetes mellitus   CAD (coronary artery disease)   Chest pain   1. Shortness of breath The patient is presenting complaints of shortness of breath on exertion. Considering his history of coronary artery disease this could be considered as angina equivalent. He also had some complaints of left arm pain which appears to is currently in nature. He denies any chest pain at present, but initially he was complaining of some and he was given some IV pain medications. His 2 sets of troponins are negative and EKG also does not show any acute abnormality. He will be admitted for observation, serial troponin and telemetry and an echocardiogram in the morning. His proBNP is negative and he does not appear to be in volume overload although he does have history of ischemic cardio myopathy.  2. Left arm pain primarily in the shoulder More likely muscular in nature I would continue his Percocet. I would avoid further escalating doses on the narcotics since he seems comfortable with movement of his shoulder as well.  3. History of diabetes mellitus Continue glipizide and placed on sliding scale  4. CAD Continue aspirin and Plavix  DVT Prophylaxis: subcutaneous Heparin Nutrition: Cardiac and  diabetic diet  Code Status: Full  Disposition: Admitted to observation in telemetry unit.  Author: Lynden Oxford, MD [facility] Pager: [phone] 11/10/2013, 9:42 PM    If 7PM-7AM, please contact night-coverage [email or web address] Password [email or web address]

## 2013-11-10 NOTE — ED Notes (Signed)
Attempted to give report 

## 2013-11-10 NOTE — ED Notes (Signed)
PA informed of pt's request. PA at [facility].

## 2013-11-10 NOTE — ED Notes (Addendum)
Lab called stated spoke with triage nurse and stated does not see ordered for blood that was tubed. Will reorder.

## 2013-11-10 NOTE — ED Notes (Signed)
Patient transported to XR. 

## 2013-11-11 ENCOUNTER — Observation Stay (HOSPITAL_COMMUNITY): Payer: Medicaid Other

## 2013-11-11 ENCOUNTER — Encounter (HOSPITAL_COMMUNITY): Payer: Self-pay | Admitting: Radiology

## 2013-11-11 DIAGNOSIS — R0609 Other forms of dyspnea: Secondary | ICD-10-CM

## 2013-11-11 DIAGNOSIS — E785 Hyperlipidemia, unspecified: Secondary | ICD-10-CM

## 2013-11-11 DIAGNOSIS — I2510 Atherosclerotic heart disease of native coronary artery without angina pectoris: Secondary | ICD-10-CM

## 2013-11-11 DIAGNOSIS — R079 Chest pain, unspecified: Secondary | ICD-10-CM

## 2013-11-11 DIAGNOSIS — I498 Other specified cardiac arrhythmias: Secondary | ICD-10-CM

## 2013-11-11 DIAGNOSIS — R0789 Other chest pain: Secondary | ICD-10-CM

## 2013-11-11 LAB — CBC WITH DIFFERENTIAL/PLATELET
Basophils Absolute: 0.1 10*3/uL (ref 0.0–0.1)
Eosinophils Absolute: 0.2 10*3/uL (ref 0.0–0.7)
Eosinophils Relative: 3 % (ref 0–5)
HCT: 38.8 % — ABNORMAL LOW (ref 39.0–52.0)
Hemoglobin: 13.1 g/dL (ref 13.0–17.0)
Lymphocytes Relative: 48 % — ABNORMAL HIGH (ref 12–46)
Lymphs Abs: 2.1 10*3/uL (ref 0.7–4.0)
MCH: 31.6 pg (ref 26.0–34.0)
MCHC: 33.8 g/dL (ref 30.0–36.0)
MCV: 93.5 fL (ref 78.0–100.0)
Monocytes Absolute: 0.5 10*3/uL (ref 0.1–1.0)
RDW: 13.8 % (ref 11.5–15.5)

## 2013-11-11 LAB — COMPREHENSIVE METABOLIC PANEL
AST: 13 U/L (ref 0–37)
Alkaline Phosphatase: 56 U/L (ref 39–117)
CO2: 26 mEq/L (ref 19–32)
Calcium: 9 mg/dL (ref 8.4–10.5)
Creatinine, Ser: 0.59 mg/dL (ref 0.50–1.35)
GFR calc Af Amer: >90 mL/min (ref >90)
GFR calc non Af Amer: >90 mL/min (ref >90)
Glucose, Bld: 79 mg/dL (ref 70–99)

## 2013-11-11 LAB — GLUCOSE, CAPILLARY
Glucose-Capillary: 142 mg/dL — ABNORMAL HIGH (ref 70–99)
Glucose-Capillary: 85 mg/dL (ref 70–99)
Glucose-Capillary: 78 mg/dL (ref 70–99)

## 2013-11-11 LAB — INFLUENZA PANEL BY PCR (TYPE A & B)
H1N1 flu by pcr: NOT DETECTED
Influenza A By PCR: NEGATIVE
Influenza B By PCR: NEGATIVE

## 2013-11-11 LAB — PHENYTOIN LEVEL, TOTAL: Phenytoin Lvl: 20.7 ug/mL — ABNORMAL HIGH (ref 10.0–20.0)

## 2013-11-11 LAB — D-DIMER, QUANTITATIVE: D-Dimer, Quant: 1.6 ug/mL-FEU — ABNORMAL HIGH (ref 0.00–0.48)

## 2013-11-11 MED ORDER — IOHEXOL 350 MG/ML SOLN
100.0000 mL | Freq: Once | INTRAVENOUS | Status: AC | PRN
  Administered 2013-11-11: 100 mL via INTRAVENOUS

## 2013-11-11 MED ORDER — ALBUTEROL SULFATE HFA 108 (90 BASE) MCG/ACT IN AERS: 2.0000 | INHALATION_SPRAY | Freq: Four times a day (QID) | RESPIRATORY_TRACT | Status: DC | PRN

## 2013-11-11 MED ORDER — GLUCERNA SHAKE PO LIQD
237.0000 mL | Freq: Three times a day (TID) | ORAL | Status: DC
  Administered 2013-11-11 (×2): 237 mL via ORAL

## 2013-11-11 NOTE — Discharge Summary (Signed)
Physician Discharge Summary  Patient ID: [name] [name] MRN: [id number] DOB/AGE: 07/10/1951 62 y.o.  Admit date: 11/10/2013 Discharge date: 11/11/2013  Primary Care Physician:  Mia Creek, MD  Discharge Diagnoses:    . atypical Chest pain- resolved  . Shortness of breath- resolved, recent acute bronchitis  . PVD (peripheral vascular disease) with claudication . CAD (coronary artery disease) . Type 2 diabetes mellitus . Dyslipidemia  Consults: Cardiology, Dr. Excell Seltzer via phone consultation   Recommendations for Outpatient Follow-up:  Discussed with Dr. Excell Seltzer, cardiology will arrange outpatient nuclear medicine stress test  Allergies:   Allergies  Allergen Reactions  . Penicillins Other (See Comments)    Unknown  . Zetia [Ezetimibe] Swelling     Discharge Medications:   Medication List         acetaminophen 500 MG tablet  Commonly known as:  TYLENOL  Take 1,000 mg by mouth every 6 (six) hours as needed for moderate pain.     albuterol 108 (90 BASE) MCG/ACT inhaler  Commonly known as:  PROVENTIL HFA;VENTOLIN HFA  Inhale 2 puffs into the lungs every 6 (six) hours as needed for wheezing or shortness of breath.     aspirin EC 81 MG tablet  Take 81 mg by mouth daily.     clopidogrel 75 MG tablet  Commonly known as:  PLAVIX  Take 75 mg by mouth daily with breakfast.     glipiZIDE 10 MG tablet  Commonly known as:  GLUCOTROL  Take 10 mg by mouth 2 (two) times daily before a meal.     guaiFENesin-dextromethorphan 100-10 MG/5ML syrup  Commonly known as:  ROBITUSSIN DM  Take 10 mLs by mouth every 4 (four) hours as needed for cough.     nitroGLYCERIN 0.4 MG SL tablet  Commonly known as:  NITROSTAT  Place 0.4 mg under the tongue every 5 (five) minutes as needed for chest pain.     oxyCODONE-acetaminophen 5-325 MG per tablet  Commonly known as:  PERCOCET  Take 1 tablet by mouth every 6 (six) hours as needed.     phenytoin 100 MG ER capsule  Commonly known  as:  DILANTIN  Take 200-300 mg by mouth 2 (two) times daily. Take 200 mg in the morning & 300 mg in the evening.     polyethylene glycol packet  Commonly known as:  MIRALAX / GLYCOLAX  Take 17 g by mouth daily as needed.     promethazine 12.5 MG tablet  Commonly known as:  PHENERGAN  Take 1 tablet (12.5 mg total) by mouth every 6 (six) hours as needed for nausea or vomiting.     triamcinolone cream 0.1 %  Commonly known as:  KENALOG  Apply 1 application topically 2 (two) times daily as needed (for eczema).     zolpidem 5 MG tablet  Commonly known as:  AMBIEN  Take 5 mg by mouth at bedtime as needed. For sleep         Brief H and P: For complete details please refer to admission H and P, but in brief the patient is a 62 year old male with history of coronary disease, hypertension dyslipidemia, ischemic cardiomyopathy presented with shortness of breath for last one week. Patient reported that 2 weeks ago he had upper respiratory symptoms and was given Robitussin for the same. He denied any fevers but mentioned about cough with brownish expectoration. He did mention orthopnea, PND and pain in his shoulder and elbow. Given his cardiac history patient was admitted for  chest pain rule out.   Hospital Course:  Atypical chest pain with shortness of breath, left shoulder pain:  given patient has a history of coronary artery disease, he was admitted for rule out MI. Cardiac enzymes remained negative, EKG did not show any acute ST-T wave changes suggestive of ischemia. Patient had a 2-D echo recently in September 2014 which showed EF of 55-60% with no regional wall motion abnormalities. Patient has multiple ER visits with admission for the chest pain or dyspnea, I discussed with Dr. Tonny Bollman (patient's cardiologist) who recommended outpatient stress test which will be arranged by the cardiology office. Given patient had recent bronchitis with upper respiratory symptoms and dyspnea, influenza  PCR was negative. D-dimer was slightly elevated at 1.6. CT angiogram of the chest showed no acute cardiopulmonary disease and no evidence of pulmonary embolism   Left elbow pain, left shoulder pain  - Likely due to arthritis. Left elbow x-ray showed osteoarthritic changes without evidence of acute osseous abnormalities.   Day of Discharge BP 142/74  Pulse 57  Temp(Src) 98.9 F (37.2 C) (Oral)  Resp 17  Ht 6\' 3"  (1.905 m)  Wt 70.035 kg (154 lb 6.4 oz)  BMI 19.30 kg/m2  SpO2 98%  Physical Exam: General: Alert and awake oriented x3 not in any acute distress. CVS: S1-S2 clear no murmur rubs or gallops Chest: clear to auscultation bilaterally, no wheezing rales or rhonchi Abdomen: soft nontender, nondistended, normal bowel sounds Extremities: no cyanosis, clubbing or edema noted bilaterally Neuro: Cranial nerves II-XII intact, no focal neurological deficits   The results of significant diagnostics from this hospitalization (including imaging, microbiology, ancillary and laboratory) are listed below for reference.    LAB RESULTS: Basic Metabolic Panel:  Recent Labs Lab 11/10/13 1300 11/11/13 0627  NA 133* 140  K 4.1 4.2  CL 97 107  CO2 25 26  GLUCOSE 185* 79  BUN 7 8  CREATININE 0.62 0.59  CALCIUM 9.3 9.0   Liver Function Tests:  Recent Labs Lab 11/07/13 1159 11/11/13 0627  AST 18 13  ALT 16 10  ALKPHOS 71 56  BILITOT 0.2* 0.1*  PROT 7.6 6.2  ALBUMIN 4.4 3.5   No results found for this basename: LIPASE, AMYLASE,  in the last 168 hours No results found for this basename: AMMONIA,  in the last 168 hours CBC:  Recent Labs Lab 11/10/13 1300 11/11/13 0627  WBC 5.3 4.4  NEUTROABS 2.6 1.6*  HGB 14.2 13.1  HCT 40.8 38.8*  MCV 91.9 93.5  PLT PLATELET CLUMPS NOTED ON SMEAR, COUNT APPEARS ADEQUATE 152   Cardiac Enzymes:  Recent Labs Lab 11/11/13 0215 11/11/13 0820  TROPONINI <0.30 <0.30   BNP: No components found with this basename: POCBNP,   CBG:  Recent Labs Lab 11/11/13 1035 11/11/13 1144  GLUCAP 85 78    Significant Diagnostic Studies:  Dg Chest 2 View  11/10/2013   CLINICAL DATA:  Shortness of breath  EXAM: CHEST  2 VIEW  COMPARISON:  11/07/2013  FINDINGS: The heart size and mediastinal contours are within normal limits. Both lungs are clear. The visualized skeletal structures are unremarkable. Stable hyperinflation. Coronary stents noted on the lateral view. Remote healed right anterior rib fractures.  IMPRESSION: Stable hyperinflation.  No acute chest process.   Electronically Signed   By: Ruel Favors M.D.   On: 11/10/2013 15:25   Dg Elbow Complete Left  11/10/2013   CLINICAL DATA:  Pain  EXAM: LEFT ELBOW - COMPLETE 3+ VIEW  COMPARISON:  None.  FINDINGS: There is no evidence of fracture, dislocation, or malalignment. Areas of osteophytosis identified along the periphery of the olecranon. The soft tissues are unremarkable.  IMPRESSION: Osteoarthritic changes without evidence of acute osseous abnormalities.   Electronically Signed   By: Salome Holmes M.D.   On: 11/10/2013 15:27   Ct Angio Chest Pe W/cm &/or Wo Cm  11/11/2013   CLINICAL DATA:  Elevated D-dimer.  Rule out PE.  EXAM: CT ANGIOGRAPHY CHEST WITH CONTRAST  TECHNIQUE: Multidetector CT imaging of the chest was performed using the standard protocol during bolus administration of intravenous contrast. Multiplanar CT image reconstructions including MIPs were obtained to evaluate the vascular anatomy.  CONTRAST:  OMNIPAQUE IOHEXOL 350 MG/ML SOLN  COMPARISON:  10/24/2013, 12/06/2011 and 07/04/2005  FINDINGS: The lungs are well inflated with mild bibasilar dependent atelectasis. There is no evidence of effusion. There is minimal emphysematous disease. There is mild cardiomegaly. There is no evidence of pulmonary embolism. There is minimal calcified plaque over the left main coronary and lateral circumflex arteries. There are a few calcified mediastinal lymph nodes.  There is no mediastinal, hilar or axillary adenopathy. Remaining mediastinal structures are unremarkable. Ascending thoracic aorta measures 3.6 cm in AP diameter.  Images through the upper abdomen are unremarkable.  Review of the MIP images confirms the above findings.  IMPRESSION: No acute cardiopulmonary disease and no evidence of pulmonary embolism.  Minimal emphysematous disease and mild dependent bibasilar atelectatic change.  Minimal atherosclerotic coronary artery disease.   Electronically Signed   By: Elberta Fortis M.D.   On: 11/11/2013 12:16    Disposition and Follow-up:     Discharge Orders   Future Orders Complete By Expires   Diet Carb Modified  As directed    Increase activity slowly  As directed        DISPOSITION: Home  DIET:, Carb modified diet   DISCHARGE FOLLOW-UP Follow-up Information   Follow up with TALBOT, DAVID C, MD. Schedule an appointment as soon as possible for a visit in 2 weeks.   Specialty:  Internal Medicine   Contact information:   [address] [place] [address] [phone]       Follow up with Tonny Bollman, MD. Schedule an appointment as soon as possible for a visit in 2 weeks.   Specialty:  Cardiology   Contact information:   [address] [place] [address] [phone]       Time spent on Discharge: 45 minutes   Signed:   Murvin Gift M.D. [name] 11/11/2013, 3:04 PM Pager: [phone]

## 2013-11-11 NOTE — ED Provider Notes (Signed)
Medical screening examination/treatment/procedure(s) were performed by non-physician practitioner and as supervising physician I was immediately available for consultation/collaboration.  Flint Melter, MD 11/11/13 Windy Fast

## 2013-11-11 NOTE — Progress Notes (Addendum)
INITIAL NUTRITION ASSESSMENT  DOCUMENTATION CODES Per approved criteria  -Severe malnutrition in the context of chronic illness   INTERVENTION:  Glucerna Shake PO TID, each supplement provides 220 kcal and 10 grams of protein  NUTRITION DIAGNOSIS: Malnutrition related to inadequate oral intake as evidenced by severe depletion of muscle stores and intake </= 75% of estimated energy requirement for >/= 1 month.   Goal: Intake to meet >90% of estimated nutrition needs.  Monitor:  PO intake, labs, weight trend.  Reason for Assessment: MST  62 y.o. male  Admitting Dx: Shortness of breath  ASSESSMENT: Patient presented to the ED on 12/17 with complaints of shortness of breath that has been ongoing for one week. He also c/o worsening sharp pain in his shoulder and elbow after taking nitroglycerin the night before.  Patient reports that he has no appetite and eats like a bird at home. He lives alone and does not like eating alone, so he wastes a lot of food. He lost ~10-12 lb over the summer due to surgery and has not gained any of the weight back.   Nutrition Focused Physical Exam:  Subcutaneous Fat:  Orbital Region: mild-moderate depletion Upper Arm Region: WNL Thoracic and Lumbar Region: mild-moderate depletion  Muscle:  Temple Region: severe depletion Clavicle Bone Region: severe depletion Clavicle and Acromion Bone Region: severe depletion Scapular Bone Region: mild-moderate depletion Dorsal Hand: WNL Patellar Region: WNL Anterior Thigh Region: WNL Posterior Calf Region: WNL  Edema: none  Pt meets criteria for severe MALNUTRITION in the context of chronic illness as evidenced by severe depletion of muscle stores and intake </= 75% of estimated energy requirement for >/= 1 month.  Height: Ht Readings from Last 1 Encounters:  11/10/13 6\' 3"  (1.905 m)    Weight: Wt Readings from Last 1 Encounters:  11/10/13 154 lb 6.4 oz (70.035 kg)    Ideal Body Weight: 89.1  kg  % Ideal Body Weight: 79%  Wt Readings from Last 10 Encounters:  11/10/13 154 lb 6.4 oz (70.035 kg)  10/23/13 156 lb 8 oz (70.988 kg)  08/04/13 159 lb (72.122 kg)  07/27/13 157 lb 1.6 oz (71.26 kg)  07/08/13 154 lb 8.7 oz (70.1 kg)  06/23/13 157 lb (71.215 kg)  06/08/13 169 lb 8.5 oz (76.9 kg)  06/08/13 169 lb 8.5 oz (76.9 kg)  03/17/12 166 lb 14.4 oz (75.705 kg)  01/21/12 164 lb (74.39 kg)    Usual Body Weight: 169 lb  % Usual Body Weight: 91%  BMI:  Body mass index is 19.3 kg/(m^2).  Estimated Nutritional Needs: Kcal: 2100-2300 Protein: 110-120 gm Fluid: 2.1-2.3 L  Skin: no wounds  Diet Order: Carb Control  EDUCATION NEEDS: -Education needs addressed-discussed ways to maintain good glycemic control while increasing calorie intake; encouraged eating a protein source with all meals and snacks. Also discussed eating on a schedule, 3 meals and 2-3 snacks per day to increase intake. Patient willing to try Glucerna Shake between meals to help increase oral intake.   Intake/Output Summary (Last 24 hours) at 11/11/13 0950 Last data filed at 11/11/13 1610  Gross per 24 hour  Intake   1000 ml  Output   1400 ml  Net   -400 ml    Last BM: 12/17   Labs:   Recent Labs Lab 11/07/13 1159 11/10/13 1300 11/11/13 0627  NA 135 133* 140  K 4.1 4.1 4.2  CL 98 97 107  CO2 27 25 26   BUN 5* 7 8  CREATININE 0.62  0.62 0.59  CALCIUM 9.5 9.3 9.0  GLUCOSE 178* 185* 79    CBG (last 3)   Recent Labs  11/10/13 2127 11/11/13 0628 11/11/13 0724  GLUCAP 133* 86 142*    Scheduled Meds: . aspirin EC  81 mg Oral Daily  . clopidogrel  75 mg Oral Q breakfast  . enoxaparin (LOVENOX) injection  40 mg Subcutaneous Q24H  . glipiZIDE  10 mg Oral BID AC  . influenza vac split quadrivalent PF  0.5 mL Intramuscular Tomorrow-1000  . insulin aspart  0-15 Units Subcutaneous TID WC  . insulin aspart  0-5 Units Subcutaneous QHS  . phenytoin  200 mg Oral Daily  . phenytoin  300 mg  Oral QHS    Continuous Infusions:   Past Medical History  Diagnosis Date  . Hyperlipidemia   . Hypertension   . Leg pain   . Peripheral vascular disease     s/p prior Ao-Bifem BPG  . 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 Coronary artery disease     a. NSTEMI 7/14=> LHC 06/07/13: Proximal LAD 30%, ostial D1 30%, AV circumflex 80%, then occluded, ostial OM2 occluded, left to left collaterals, mid RCA 95%, inferior HK, EF 60%. PCI: Promus DES to the mid RCA.  No intervention was recommended for the CTO circumflex.  . Ischemic cardiomyopathy     a. Echocardiogram 06/06/13: EF 35-40%, inferior AK, mild MR.  Marland Kitchen Shortness of breath     "can come on at anytime lately; before that it was just w/exertion" (11/10/2013)  . Type II diabetes mellitus   . Grand mal seizure     "last one was ~ 3 yr ago; controlled w/daily RX" (11/10/2013)  . Arthritis     "neck, back, legs" (11/10/2013)  . Kidney stones     "when I was younger; passed w/o treatment" (11/10/2013)    Past Surgical History  Procedure Laterality Date  . Iliac artery stent  05/06/2011  . Tonsillectomy    . Aorta - bilateral femoral artery bypass graft  11/25/2011    Procedure: AORTA BIFEMORAL BYPASS GRAFT;  Surgeon: Larina Earthly, MD;  Location: [facility] OR;  Service: Vascular;  Laterality: N/A;  . Pr vein bypass graft,aorto-fem-pop  11/24/2012  . Inguinal hernia repair Right     Joaquin Courts, RD, LDN, CNSC Pager [phone] After Hours Pager [phone]

## 2013-11-12 ENCOUNTER — Other Ambulatory Visit: Payer: Self-pay

## 2013-11-12 DIAGNOSIS — R079 Chest pain, unspecified: Secondary | ICD-10-CM

## 2013-11-13 NOTE — ED Provider Notes (Signed)
Medical screening examination/treatment/procedure(s) were performed by non-physician practitioner and as supervising physician I was immediately available for consultation/collaboration.  Pualani Borah T Kassadi Presswood, MD 11/13/13 1622 

## 2013-11-29 ENCOUNTER — Encounter: Payer: Self-pay | Admitting: Cardiovascular Disease

## 2013-11-29 ENCOUNTER — Ambulatory Visit (HOSPITAL_COMMUNITY): Payer: Medicaid Other | Attending: Cardiovascular Disease | Admitting: Radiology

## 2013-11-29 VITALS — BP 136/85 | HR 56 | Ht 75.0 in | Wt 153.0 lb

## 2013-11-29 DIAGNOSIS — I2510 Atherosclerotic heart disease of native coronary artery without angina pectoris: Secondary | ICD-10-CM | POA: Insufficient documentation

## 2013-11-29 DIAGNOSIS — R0989 Other specified symptoms and signs involving the circulatory and respiratory systems: Secondary | ICD-10-CM | POA: Insufficient documentation

## 2013-11-29 DIAGNOSIS — I10 Essential (primary) hypertension: Secondary | ICD-10-CM | POA: Insufficient documentation

## 2013-11-29 DIAGNOSIS — I252 Old myocardial infarction: Secondary | ICD-10-CM | POA: Insufficient documentation

## 2013-11-29 DIAGNOSIS — E119 Type 2 diabetes mellitus without complications: Secondary | ICD-10-CM | POA: Insufficient documentation

## 2013-11-29 DIAGNOSIS — F17200 Nicotine dependence, unspecified, uncomplicated: Secondary | ICD-10-CM | POA: Insufficient documentation

## 2013-11-29 DIAGNOSIS — R0609 Other forms of dyspnea: Secondary | ICD-10-CM | POA: Insufficient documentation

## 2013-11-29 DIAGNOSIS — I739 Peripheral vascular disease, unspecified: Secondary | ICD-10-CM | POA: Insufficient documentation

## 2013-11-29 DIAGNOSIS — R0602 Shortness of breath: Secondary | ICD-10-CM | POA: Insufficient documentation

## 2013-11-29 DIAGNOSIS — R079 Chest pain, unspecified: Secondary | ICD-10-CM | POA: Insufficient documentation

## 2013-11-29 MED ORDER — REGADENOSON 0.4 MG/5ML IV SOLN
0.4000 mg | Freq: Once | INTRAVENOUS | Status: AC
  Administered 2013-11-29: 0.4 mg via INTRAVENOUS

## 2013-11-29 MED ORDER — AMINOPHYLLINE 25 MG/ML IV SOLN
75.0000 mg | Freq: Once | INTRAVENOUS | Status: AC
  Administered 2013-11-29: 75 mg via INTRAVENOUS

## 2013-11-29 MED ORDER — TECHNETIUM TC 99M SESTAMIBI GENERIC - CARDIOLITE
30.0000 | Freq: Once | INTRAVENOUS | Status: AC | PRN
  Administered 2013-11-29: 30 via INTRAVENOUS

## 2013-11-29 MED ORDER — TECHNETIUM TC 99M SESTAMIBI GENERIC - CARDIOLITE
10.0000 | Freq: Once | INTRAVENOUS | Status: AC | PRN
  Administered 2013-11-29: 10 via INTRAVENOUS

## 2013-11-29 NOTE — Progress Notes (Signed)
[facility] [address] [place], [name] [address] [phone]    Cardiology Nuclear Med Study  [name] [name] is a 63 y.o. male     MRN : [id number]     DOB: 03/26/1951  Procedure Date: 11/29/2013  Nuclear Med Background Indication for Stress Test:  Evaluation for Ischemia, and Patient seen in hospital on 11-07-13 for chest pain, enzymes negative History:  CAD, MI, Cath 2014, Stent RCA 2014, Echo 2014 EF 55-60%, MPI 2011 (scar) EF 38%, COPD Cardiac Risk Factors: Family History - CAD, Hypertension, Lipids, NIDDM, PVD and Smoker  Symptoms:  Chest Pain, DOE and SOB   Nuclear Pre-Procedure Caffeine/Decaff Intake:  None > 12 hrs NPO After: 7:00am   Lungs:  clear O2 Sat: 97% on room air. IV 0.9% NS with Angio Cath:  22g  IV Site: R Wrist x 1, tolerated well IV Started by:  Irven Baltimore, RN  Chest Size (in):  38 Cup Size: n/a  Height: 6\' 3"  (1.905 m)  Weight:  153 lb (69.4 kg)  BMI:  Body mass index is 19.12 kg/(m^2). Tech Comments:  Took glucotrol with breakfast    Nuclear Med Study 1 or 2 day study: 1 day  Stress Test Type:  Lexiscan  Reading MD: N/A  Order Authorizing Provider:  Sherren Mocha, MD  Resting Radionuclide: Technetium 73m Sestamibi  Resting Radionuclide Dose: 11.0 mCi   Stress Radionuclide:  Technetium 42m Sestamibi  Stress Radionuclide Dose: 33.0 mCi           Stress Protocol Rest HR: 56 Stress HR: 81  Rest BP: 136/85 Stress BP: 135/90  Exercise Time (min): n/a METS: n/a           Dose of Adenosine (mg):  n/a Dose of Lexiscan: 0.4 mg  Dose of Atropine (mg): n/a Dose of Dobutamine: n/a mcg/kg/min (at max HR)  Stress Test Technologist: Glade Lloyd, BS-ES  Nuclear Technologist:  Charlton Amor, CNMT     Rest Procedure:  Myocardial perfusion imaging was performed at rest 45 minutes following the intravenous administration of Technetium 36m Sestamibi. Rest ECG: NSR poor R wave progressin nonspecific T wave  changes  Stress Procedure:  The patient received IV Lexiscan 0.4 mg over 15-seconds.  Technetium 47m Sestamibi injected at 30-seconds.  Quantitative spect images were obtained after a 45 minute delay.  During the infusion of Lexiscan, the patient stated he felt "scared", felt he had to go to the bathroom, had pain in his neck and a headache.  He continued to complain of symptoms so the patient was administered 75 mg aminophylline by Hermine Messick, RN. Within five minutes of aminophylline the patient was feeling better.  Stress ECG: No significant change from baseline ECG and No significant ST segment change suggestive of ischemia.  QPS Raw Data Images:  Normal; no motion artifact; normal heart/lung ratio. Stress Images:  There is decreased uptake in the inferior wall. Rest Images:  There is decreased uptake in the inferior wall. Subtraction (SDS):  mixed infarct and ischemia Transient Ischemic Dilatation (Normal <1.22):  1.02 Lung/Heart Ratio (Normal <0.45):  0.25  Quantitative Gated Spect Images QGS EDV:  146 ml QGS ESV:  86 ml  Impression Exercise Capacity:  Lexiscan with no exercise. BP Response:  Normal blood pressure response. Clinical Symptoms:  Scared given 75 mg of aminophyline  ECG Impression:  No significant ST segment change suggestive of ischemia. Comparison with Prior Nuclear Study: No images to compare  Overall Impression:  Intermediate risk  Small inferior and lateral wall infarct at mid and basal level with mild peri infarct ischemia  LV Ejection Fraction: 41%.  LV Wall Motion:  Diffuse hypokinesis worse in the apex and inferior base   [employer]

## 2013-12-01 ENCOUNTER — Ambulatory Visit (INDEPENDENT_AMBULATORY_CARE_PROVIDER_SITE_OTHER): Payer: Medicaid Other | Admitting: Cardiovascular Disease

## 2013-12-01 ENCOUNTER — Encounter: Payer: Self-pay | Admitting: Cardiovascular Disease

## 2013-12-01 ENCOUNTER — Encounter: Payer: Self-pay | Admitting: Nurse Practitioner

## 2013-12-01 VITALS — BP 134/82 | HR 64 | Ht 75.0 in | Wt 154.8 lb

## 2013-12-01 DIAGNOSIS — I2510 Atherosclerotic heart disease of native coronary artery without angina pectoris: Secondary | ICD-10-CM

## 2013-12-01 NOTE — Patient Instructions (Signed)
Your physician has requested that you have a cardiac catheterization. Cardiac catheterization is used to diagnose and/or treat various heart conditions. Doctors may recommend this procedure for a number of different reasons. The most common reason is to evaluate chest pain. Chest pain can be a symptom of coronary artery disease (CAD), and cardiac catheterization can show whether plaque is narrowing or blocking your heart's arteries. This procedure is also used to evaluate the valves, as well as measure the blood flow and oxygen levels in different parts of your heart. For further information please visit [email or web address]. Please follow instruction sheet, as given.   Your physician recommends that you have lab work today in preparation for your cardiac catheterization

## 2013-12-01 NOTE — Progress Notes (Signed)
HPI:   63 year old gentleman presenting for followup evaluation. The patient was hospitalized in July 2014 with non-ST elevation infarction. He was noted to have moderately severe left ventricular dysfunction. He has known peripheral arterial disease with history of aortobifemoral bypass grafting. He underwent cardiac catheterization demonstrating multivessel coronary artery disease. He has a chronic occlusion of the left circumflex. He had severe stenosis of the mid right coronary artery and this was treated with stenting. His left ventricular ejection fraction by cath was 60%.  The patient has had multiple ER and hospital evaluation is for chest pain since his PCI procedure. He has a difficult time distinguishing cardiac symptoms. He's had recent cold and flu symptoms. He describes left-sided chest and back pain with a pleuritic component. He's had some neck pain which was present when he had acute coronary syndrome last year. 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  At the time of his last hospitalization, we elected to arrange an outpatient nuclear scan for risk stratification. He had ruled out for myocardial infarction and his chest pain symptoms were atypical. However, his nuclear scan demonstrated inferior infarct and ischemia and was interpreted as a moderate risk study.  The patient has not been taking aspirin regularly because of concerns about gastric irritation. He does take his Plavix every day. He continues to smoke.   Outpatient Encounter Prescriptions as of 12/01/2013  Medication Sig  . acetaminophen (TYLENOL) 500 MG tablet Take 1,000 mg by mouth every 6 (six) hours as needed for moderate pain.  Marland Kitchen albuterol (PROVENTIL HFA;VENTOLIN HFA) 108 (90 BASE) MCG/ACT inhaler Inhale 2 puffs into the lungs every 6 (six) hours as needed for wheezing or shortness of breath.  Marland Kitchen aspirin EC 81 MG tablet Take 81 mg by mouth daily.  . clopidogrel (PLAVIX) 75 MG tablet Take 75 mg by  mouth daily with breakfast.  . glipiZIDE (GLUCOTROL) 10 MG tablet Take 10 mg by mouth 2 (two) times daily before a meal.  . guaiFENesin-dextromethorphan (ROBITUSSIN DM) 100-10 MG/5ML syrup Take 10 mLs by mouth every 4 (four) hours as needed for cough.  . nitroGLYCERIN (NITROSTAT) 0.4 MG SL tablet Place 0.4 mg under the tongue every 5 (five) minutes as needed for chest pain.  Marland Kitchen oxyCODONE-acetaminophen (PERCOCET) 5-325 MG per tablet Take 1 tablet by mouth every 6 (six) hours as needed.  . phenytoin (DILANTIN) 100 MG ER capsule Take 200-300 mg by mouth 2 (two) times daily. Take 200 mg in the morning & 300 mg in the evening.  . polyethylene glycol (MIRALAX / GLYCOLAX) packet Take 17 g by mouth daily as needed.  . promethazine (PHENERGAN) 12.5 MG tablet Take 1 tablet (12.5 mg total) by mouth every 6 (six) hours as needed for nausea or vomiting.  . triamcinolone cream (KENALOG) 0.1 % Apply 1 application topically 2 (two) times daily as needed (for eczema).  . zolpidem (AMBIEN) 5 MG tablet Take 5 mg by mouth at bedtime as needed. For sleep    Allergies  Allergen Reactions  . Penicillins Other (See Comments)    Unknown  . Zetia [Ezetimibe] Swelling    Past Medical History  Diagnosis Date  . Hyperlipidemia   . Hypertension   . Leg pain   . Peripheral vascular disease     s/p prior Ao-Bifem BPG  . 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 Coronary artery disease     a. NSTEMI 7/14=> LHC  06/07/13: Proximal LAD 30%, ostial D1 30%, AV circumflex 80%, then occluded, ostial OM2 occluded, left to left collaterals, mid RCA 95%, inferior HK, EF 60%. PCI: Promus DES to the mid RCA.  No intervention was recommended for the CTO circumflex.  . Ischemic cardiomyopathy     a. Echocardiogram 06/06/13: EF 35-40%, inferior AK, mild MR.  Marland Kitchen Shortness of breath     "can come on at anytime lately; before that it was just w/exertion" (11/10/2013)  . Type II diabetes  mellitus   . Grand mal seizure     "last one was ~ 3 yr ago; controlled w/daily RX" (11/10/2013)  . Arthritis     "neck, back, legs" (11/10/2013)  . Kidney stones     "when I was younger; passed w/o treatment" (11/10/2013)    ROS: Negative except as per HPI  BP 134/82  Pulse 64  Ht 6\' 3"  (1.905 m)  Wt 154 lb 12.8 oz (70.217 kg)  BMI 19.35 kg/m2  SpO2 98%  PHYSICAL EXAM: Pt is alert and oriented, NAD HEENT: normal Neck: JVP - normal, carotids 2+= without bruits Lungs: CTA bilaterally CV: RRR without murmur or gallop Abd: soft, NT, Positive BS, no hepatomegaly Ext: no C/C/E Skin: warm/dry no rash  Myoview Scan 11/30/2013: Impression  Exercise Capacity: Lexiscan with no exercise.  BP Response: Normal blood pressure response.  Clinical Symptoms: Scared given 75 mg of aminophyline  ECG Impression: No significant ST segment change suggestive of ischemia.  Comparison with Prior Nuclear Study: No images to compare  Overall Impression: Intermediate risk Small inferior and lateral wall infarct at mid and basal level with mild peri infarct ischemia  LV Ejection Fraction: 41%. LV Wall Motion: Diffuse hypokinesis worse in the apex and inferior base   Echo 08/18/2013: Study Conclusions  - Left ventricle: The cavity size was normal. Systolic function was normal. The estimated ejection fraction was in the range of 55% to 60%. Wall motion was normal; there were no regional wall motion abnormalities. - Atrial septum: No defect or patent foramen ovale was identified.  ASSESSMENT AND PLAN: 1. Coronary artery disease, native vessel. Discussed options with the patient regarding his recurrent chest pain, frequent hospital evaluation, and moderate risk stress study demonstrating inferior infarction with peri-infarct ischemia. Considering his chronic occlusion of the left circumflex/obtuse marginal branches, and PCI of the RCA last year, we elected to proceed with cardiac catheterization and  possible PCI for definitive evaluation and treatment. I have reviewed the risks, indications, and alternatives with the patient who understands and agrees to proceed. We'll plan a right radial approach. His angina is atypical and difficult to characterize by CCS criteria because he is very inactive. He is not on a beta-blocker because of marked bradycardia.   2. Tobacco abuse. Cessation counseling done.  3. Hyperlipidemia. The patient is intolerant to statins and to zetia. Will continue with an approach of lifestyle modification.  Sherren Mocha 12/01/2013 6:03 PM

## 2013-12-02 LAB — BASIC METABOLIC PANEL
BUN: 11 mg/dL (ref 6–23)
CHLORIDE: 104 meq/L (ref 96–112)
CO2: 28 mEq/L (ref 19–32)
Calcium: 9.3 mg/dL (ref 8.4–10.5)
Creatinine, Ser: 0.7 mg/dL (ref 0.4–1.5)
GFR: 152.42 mL/min (ref >60.00)
Glucose, Bld: 57 mg/dL — ABNORMAL LOW (ref 70–99)
POTASSIUM: 4.2 meq/L (ref 3.5–5.1)
Sodium: 137 mEq/L (ref 135–145)

## 2013-12-02 LAB — CBC WITH DIFFERENTIAL/PLATELET
Basophils Absolute: 0 10*3/uL (ref 0.0–0.1)
Basophils Relative: 0.7 % (ref 0.0–3.0)
Eosinophils Absolute: 0.2 10*3/uL (ref 0.0–0.7)
Eosinophils Relative: 4.3 % (ref 0.0–5.0)
HEMATOCRIT: 40.8 % (ref 39.0–52.0)
Hemoglobin: 13.6 g/dL (ref 13.0–17.0)
Lymphocytes Relative: 39.3 % (ref 12.0–46.0)
Lymphs Abs: 2 10*3/uL (ref 0.7–4.0)
MCHC: 33.5 g/dL (ref 30.0–36.0)
MCV: 93.3 fl (ref 78.0–100.0)
MONO ABS: 0.5 10*3/uL (ref 0.1–1.0)
Monocytes Relative: 9.7 % (ref 3.0–12.0)
Neutro Abs: 2.3 10*3/uL (ref 1.4–7.7)
Neutrophils Relative %: 46 % (ref 43.0–77.0)
PLATELETS: 114 10*3/uL — AB (ref 150.0–400.0)
RBC: 4.37 Mil/uL (ref 4.22–5.81)
RDW: 14.4 % (ref 11.5–14.6)
WBC: 5 10*3/uL (ref 4.5–10.5)

## 2013-12-02 LAB — PROTIME-INR
INR: 1.3 ratio — ABNORMAL HIGH (ref 0.8–1.0)
Prothrombin Time: 13.7 s — ABNORMAL HIGH (ref 10.2–12.4)

## 2013-12-03 ENCOUNTER — Encounter (HOSPITAL_COMMUNITY): Payer: Self-pay | Admitting: Respiratory Therapy

## 2013-12-10 ENCOUNTER — Encounter (HOSPITAL_COMMUNITY)
Admission: RE | Disposition: A | Discharge status: Home / Self Care | Payer: Self-pay | Source: Ambulatory Visit | Attending: Cardiovascular Disease

## 2013-12-10 ENCOUNTER — Ambulatory Visit (HOSPITAL_COMMUNITY)
Admission: RE | Admit: 2013-12-10 | Discharge: 2013-12-10 | Disposition: A | Discharge status: Home / Self Care | Payer: Medicaid Other | Source: Ambulatory Visit | Attending: Cardiovascular Disease | Admitting: Cardiovascular Disease

## 2013-12-10 DIAGNOSIS — I209 Angina pectoris, unspecified: Secondary | ICD-10-CM | POA: Insufficient documentation

## 2013-12-10 DIAGNOSIS — I10 Essential (primary) hypertension: Secondary | ICD-10-CM | POA: Insufficient documentation

## 2013-12-10 DIAGNOSIS — I2510 Atherosclerotic heart disease of native coronary artery without angina pectoris: Secondary | ICD-10-CM | POA: Insufficient documentation

## 2013-12-10 DIAGNOSIS — E785 Hyperlipidemia, unspecified: Secondary | ICD-10-CM | POA: Insufficient documentation

## 2013-12-10 DIAGNOSIS — Z9861 Coronary angioplasty status: Secondary | ICD-10-CM | POA: Insufficient documentation

## 2013-12-10 DIAGNOSIS — Z7982 Long term (current) use of aspirin: Secondary | ICD-10-CM | POA: Insufficient documentation

## 2013-12-10 DIAGNOSIS — E119 Type 2 diabetes mellitus without complications: Secondary | ICD-10-CM | POA: Insufficient documentation

## 2013-12-10 DIAGNOSIS — F17200 Nicotine dependence, unspecified, uncomplicated: Secondary | ICD-10-CM | POA: Insufficient documentation

## 2013-12-10 DIAGNOSIS — Z7902 Long term (current) use of antithrombotics/antiplatelets: Secondary | ICD-10-CM | POA: Insufficient documentation

## 2013-12-10 DIAGNOSIS — I2589 Other forms of chronic ischemic heart disease: Secondary | ICD-10-CM | POA: Insufficient documentation

## 2013-12-10 DIAGNOSIS — I739 Peripheral vascular disease, unspecified: Secondary | ICD-10-CM | POA: Insufficient documentation

## 2013-12-10 DIAGNOSIS — I252 Old myocardial infarction: Secondary | ICD-10-CM | POA: Insufficient documentation

## 2013-12-10 HISTORY — PX: LEFT HEART CATHETERIZATION WITH CORONARY ANGIOGRAM: SHX5451

## 2013-12-10 LAB — GLUCOSE, CAPILLARY
GLUCOSE-CAPILLARY: 85 mg/dL (ref 70–99)
Glucose-Capillary: 83 mg/dL (ref 70–99)

## 2013-12-10 SURGERY — LEFT HEART CATHETERIZATION WITH CORONARY ANGIOGRAM
Anesthesia: LOCAL

## 2013-12-10 MED ORDER — HEPARIN (PORCINE) IN NACL 2-0.9 UNIT/ML-% IJ SOLN
INTRAMUSCULAR | Status: AC
  Filled 2013-12-10: qty 1000

## 2013-12-10 MED ORDER — LIDOCAINE HCL (PF) 1 % IJ SOLN
INTRAMUSCULAR | Status: AC
  Filled 2013-12-10: qty 30

## 2013-12-10 MED ORDER — ACETAMINOPHEN 325 MG PO TABS: 650.0000 mg | ORAL_TABLET | ORAL | Status: DC | PRN

## 2013-12-10 MED ORDER — NITROGLYCERIN 0.2 MG/ML ON CALL CATH LAB
INTRAVENOUS | Status: AC
  Filled 2013-12-10: qty 1

## 2013-12-10 MED ORDER — SODIUM CHLORIDE 0.9 % IJ SOLN: 3.0000 mL | Freq: Two times a day (BID) | INTRAMUSCULAR | Status: DC

## 2013-12-10 MED ORDER — SODIUM CHLORIDE 0.9 % IV SOLN
INTRAVENOUS | Status: DC
  Administered 2013-12-10: 07:00:00 via INTRAVENOUS

## 2013-12-10 MED ORDER — SODIUM CHLORIDE 0.9 % IV SOLN: 1.0000 mL/kg/h | INTRAVENOUS | Status: DC

## 2013-12-10 MED ORDER — SODIUM CHLORIDE 0.9 % IV SOLN: 250.0000 mL | INTRAVENOUS | Status: DC | PRN

## 2013-12-10 MED ORDER — ASPIRIN 81 MG PO CHEW
CHEWABLE_TABLET | ORAL | Status: AC
  Filled 2013-12-10: qty 1

## 2013-12-10 MED ORDER — MIDAZOLAM HCL 2 MG/2ML IJ SOLN
INTRAMUSCULAR | Status: AC
  Filled 2013-12-10: qty 2

## 2013-12-10 MED ORDER — HEPARIN SODIUM (PORCINE) 1000 UNIT/ML IJ SOLN
INTRAMUSCULAR | Status: AC
  Filled 2013-12-10: qty 1

## 2013-12-10 MED ORDER — FENTANYL CITRATE 0.05 MG/ML IJ SOLN
INTRAMUSCULAR | Status: AC
  Filled 2013-12-10: qty 2

## 2013-12-10 MED ORDER — VERAPAMIL HCL 2.5 MG/ML IV SOLN
INTRAVENOUS | Status: AC
  Filled 2013-12-10: qty 2

## 2013-12-10 MED ORDER — SODIUM CHLORIDE 0.9 % IJ SOLN: 3.0000 mL | INTRAMUSCULAR | Status: DC | PRN

## 2013-12-10 MED ORDER — ONDANSETRON HCL 4 MG/2ML IJ SOLN: 4.0000 mg | Freq: Four times a day (QID) | INTRAMUSCULAR | Status: DC | PRN

## 2013-12-10 MED ORDER — OXYCODONE-ACETAMINOPHEN 5-325 MG PO TABS
2.0000 | ORAL_TABLET | Freq: Once | ORAL | Status: AC
  Administered 2013-12-10: 2 via ORAL
  Filled 2013-12-10: qty 2

## 2013-12-10 MED ORDER — ASPIRIN 81 MG PO CHEW
81.0000 mg | CHEWABLE_TABLET | ORAL | Status: AC
  Administered 2013-12-10: 81 mg via ORAL

## 2013-12-10 NOTE — H&P (View-Only) (Signed)
  HPI:   63-year-old gentleman presenting for followup evaluation. The patient was hospitalized in July 2014 with non-ST elevation infarction. He was noted to have moderately severe left ventricular dysfunction. He has known peripheral arterial disease with history of aortobifemoral bypass grafting. He underwent cardiac catheterization demonstrating multivessel coronary artery disease. He has a chronic occlusion of the left circumflex. He had severe stenosis of the mid right coronary artery and this was treated with stenting. His left ventricular ejection fraction by cath was 60%.  The patient has had multiple ER and hospital evaluation is for chest pain since his PCI procedure. He has a difficult time distinguishing cardiac symptoms. He's had recent cold and flu symptoms. He describes left-sided chest and back pain with a pleuritic component. He's had some neck pain which was present when he had acute coronary syndrome last year. 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  At the time of his last hospitalization, we elected to arrange an outpatient nuclear scan for risk stratification. He had ruled out for myocardial infarction and his chest pain symptoms were atypical. However, his nuclear scan demonstrated inferior infarct and ischemia and was interpreted as a moderate risk study.  The patient has not been taking aspirin regularly because of concerns about gastric irritation. He does take his Plavix every day. He continues to smoke.   Outpatient Encounter Prescriptions as of 12/01/2013  Medication Sig  . acetaminophen (TYLENOL) 500 MG tablet Take 1,000 mg by mouth every 6 (six) hours as needed for moderate pain.  . albuterol (PROVENTIL HFA;VENTOLIN HFA) 108 (90 BASE) MCG/ACT inhaler Inhale 2 puffs into the lungs every 6 (six) hours as needed for wheezing or shortness of breath.  . aspirin EC 81 MG tablet Take 81 mg by mouth daily.  . clopidogrel (PLAVIX) 75 MG tablet Take 75 mg by  mouth daily with breakfast.  . glipiZIDE (GLUCOTROL) 10 MG tablet Take 10 mg by mouth 2 (two) times daily before a meal.  . guaiFENesin-dextromethorphan (ROBITUSSIN DM) 100-10 MG/5ML syrup Take 10 mLs by mouth every 4 (four) hours as needed for cough.  . nitroGLYCERIN (NITROSTAT) 0.4 MG SL tablet Place 0.4 mg under the tongue every 5 (five) minutes as needed for chest pain.  . oxyCODONE-acetaminophen (PERCOCET) 5-325 MG per tablet Take 1 tablet by mouth every 6 (six) hours as needed.  . phenytoin (DILANTIN) 100 MG ER capsule Take 200-300 mg by mouth 2 (two) times daily. Take 200 mg in the morning & 300 mg in the evening.  . polyethylene glycol (MIRALAX / GLYCOLAX) packet Take 17 g by mouth daily as needed.  . promethazine (PHENERGAN) 12.5 MG tablet Take 1 tablet (12.5 mg total) by mouth every 6 (six) hours as needed for nausea or vomiting.  . triamcinolone cream (KENALOG) 0.1 % Apply 1 application topically 2 (two) times daily as needed (for eczema).  . zolpidem (AMBIEN) 5 MG tablet Take 5 mg by mouth at bedtime as needed. For sleep    Allergies  Allergen Reactions  . Penicillins Other (See Comments)    Unknown  . Zetia [Ezetimibe] Swelling    Past Medical History  Diagnosis Date  . Hyperlipidemia   . Hypertension   . Leg pain   . Peripheral vascular disease     s/p prior Ao-Bifem BPG  . 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 Coronary artery disease     a. NSTEMI 7/14=> LHC   06/07/13: Proximal LAD 30%, ostial D1 30%, AV circumflex 80%, then occluded, ostial OM2 occluded, left to left collaterals, mid RCA 95%, inferior HK, EF 60%. PCI: Promus DES to the mid RCA.  No intervention was recommended for the CTO circumflex.  . Ischemic cardiomyopathy     a. Echocardiogram 06/06/13: EF 35-40%, inferior AK, mild MR.  Marland Kitchen Shortness of breath     "can come on at anytime lately; before that it was just w/exertion" (11/10/2013)  . Type II diabetes  mellitus   . Grand mal seizure     "last one was ~ 3 yr ago; controlled w/daily RX" (11/10/2013)  . Arthritis     "neck, back, legs" (11/10/2013)  . Kidney stones     "when I was younger; passed w/o treatment" (11/10/2013)    ROS: Negative except as per HPI  BP 134/82  Pulse 64  Ht 6\' 3"  (1.905 m)  Wt 154 lb 12.8 oz (70.217 kg)  BMI 19.35 kg/m2  SpO2 98%  PHYSICAL EXAM: Pt is alert and oriented, NAD HEENT: normal Neck: JVP - normal, carotids 2+= without bruits Lungs: CTA bilaterally CV: RRR without murmur or gallop Abd: soft, NT, Positive BS, no hepatomegaly Ext: no C/C/E Skin: warm/dry no rash  Myoview Scan 11/30/2013: Impression  Exercise Capacity: Lexiscan with no exercise.  BP Response: Normal blood pressure response.  Clinical Symptoms: Scared given 75 mg of aminophyline  ECG Impression: No significant ST segment change suggestive of ischemia.  Comparison with Prior Nuclear Study: No images to compare  Overall Impression: Intermediate risk Small inferior and lateral wall infarct at mid and basal level with mild peri infarct ischemia  LV Ejection Fraction: 41%. LV Wall Motion: Diffuse hypokinesis worse in the apex and inferior base   Echo 08/18/2013: Study Conclusions  - Left ventricle: The cavity size was normal. Systolic function was normal. The estimated ejection fraction was in the range of 55% to 60%. Wall motion was normal; there were no regional wall motion abnormalities. - Atrial septum: No defect or patent foramen ovale was identified.  ASSESSMENT AND PLAN: 1. Coronary artery disease, native vessel. Discussed options with the patient regarding his recurrent chest pain, frequent hospital evaluation, and moderate risk stress study demonstrating inferior infarction with peri-infarct ischemia. Considering his chronic occlusion of the left circumflex/obtuse marginal branches, and PCI of the RCA last year, we elected to proceed with cardiac catheterization and  possible PCI for definitive evaluation and treatment. I have reviewed the risks, indications, and alternatives with the patient who understands and agrees to proceed. We'll plan a right radial approach. His angina is atypical and difficult to characterize by CCS criteria because he is very inactive. He is not on a beta-blocker because of marked bradycardia.   2. Tobacco abuse. Cessation counseling done.  3. Hyperlipidemia. The patient is intolerant to statins and to zetia. Will continue with an approach of lifestyle modification.  Sherren Mocha 12/01/2013 6:03 PM

## 2013-12-10 NOTE — Discharge Instructions (Signed)

## 2013-12-10 NOTE — Interval H&P Note (Signed)
History and Physical Interval Note:  12/10/2013 7:51 AM  [name] [name]  has presented today for surgery, with the diagnosis of Chest pain  The various methods of treatment have been discussed with the patient and family. After consideration of risks, benefits and other options for treatment, the patient has consented to  Procedure(s): LEFT HEART CATHETERIZATION WITH CORONARY ANGIOGRAM (N/A) as a surgical intervention .  The patient's history has been reviewed, patient examined, no change in status, stable for surgery.  I have reviewed the patient's chart and labs.  Questions were answered to the patient's satisfaction.    Cath Lab Visit (complete for each Cath Lab visit)  Clinical Evaluation Leading to the Procedure:   ACS: no  Non-ACS:    Anginal Classification: CCS III  Anti-ischemic medical therapy: No Therapy  Non-Invasive Test Results: Intermediate-risk stress test findings: cardiac mortality 1-3%/year  Prior CABG: No previous CABG       Sherren Mocha

## 2013-12-10 NOTE — CV Procedure (Signed)
    Cardiac Catheterization Procedure Note  Name: [name] [name] MRN: [id number] DOB: [birth year]  Procedure: Left Heart Cath, Selective Coronary Angiography, LV angiography  Indication: Chest pain, known CAD, intermediate risk stress test   Procedural Details: The right wrist was prepped, draped, and anesthetized with 1% lidocaine. Using the modified Seldinger technique, a 5 French sheath was introduced into the right radial artery. 3 mg of verapamil was administered through the sheath, weight-based unfractionated heparin was administered intravenously. Standard Judkins catheters were used for selective coronary angiography and left ventriculography. Catheter exchanges were performed over an exchange length guidewire. There were no immediate procedural complications. A TR band was used for radial hemostasis at the completion of the procedure.  The patient was transferred to the post catheterization recovery area for further monitoring.  Procedural Findings: Hemodynamics: AO 111/58 LV 110/4  Coronary angiography: Coronary dominance: right  Left mainstem: The left mainstem is patent with no obstructive disease. The left main is short  Left anterior descending (LAD): The LAD is a large vessel that wraps around the left ventricular apex. The first diagonal branch is patent with minor ostial stenosis. The mid LAD at the first septal perforator has hypodense 30% stenosis unchanged from the previous study. The remaining portions of the LAD has minor nonobstructive disease.  Left circumflex (LCx): The left circumflex is patent the proximal aspect. The continuation of the AV circumflex is patent. The first obtuse marginal is totally occluded. The first OM fills from left to left collaterals. The vessel appears diffusely diseased in its midportion. The distal OM is patent without obstructive disease as it fills from left to left collaterals.  Right coronary artery (RCA): Large, dominant vessel. A  long stented segment in the proximal vessel is widely patent with no obstructive disease. There is no significant stenosis throughout the remaining portions of the RCA. The PDA and PLA branches are patent.  Left ventriculography: Left ventricular systolic function is normal, LVEF is estimated at 55-65%, there is no significant mitral regurgitation   Final Conclusions:   1. Widely patent RCA stent with no evidence of restenosis 2. Patent left main and LAD with minor nonobstructive disease in the mid LAD unchanged from the previous study 3. 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 Normal LV function  Recommendations: The patient has stable coronary anatomy with preserved LV function. His stress test primarily showed infarct with only mild peri-infarct ischemia. He is not having typical angina with exertion. With wide patency of his RCA stent site, I would recommend continued medical therapy. He has not been a candidate for a beta blocker because of bradycardia. His LV filling pressures are low and I don't think he'll tolerate a long-acting nitrate. Continue current medications. He was reassured about the procedural findings.  Sherren Mocha 12/10/2013, 8:19 AM

## 2014-02-12 ENCOUNTER — Emergency Department (HOSPITAL_COMMUNITY)
Admission: EM | Admit: 2014-02-12 | Discharge: 2014-02-12 | Disposition: A | Discharge status: Home / Self Care | Payer: Medicaid Other | Attending: Emergency Medicine | Admitting: Emergency Medicine

## 2014-02-12 ENCOUNTER — Emergency Department (HOSPITAL_COMMUNITY): Payer: Medicaid Other

## 2014-02-12 ENCOUNTER — Encounter (HOSPITAL_COMMUNITY): Payer: Self-pay | Admitting: Emergency Medicine

## 2014-02-12 DIAGNOSIS — M542 Cervicalgia: Secondary | ICD-10-CM

## 2014-02-12 DIAGNOSIS — Z880 Allergy status to penicillin: Secondary | ICD-10-CM | POA: Insufficient documentation

## 2014-02-12 DIAGNOSIS — I10 Essential (primary) hypertension: Secondary | ICD-10-CM | POA: Insufficient documentation

## 2014-02-12 DIAGNOSIS — Z7902 Long term (current) use of antithrombotics/antiplatelets: Secondary | ICD-10-CM | POA: Insufficient documentation

## 2014-02-12 DIAGNOSIS — M25519 Pain in unspecified shoulder: Secondary | ICD-10-CM | POA: Insufficient documentation

## 2014-02-12 DIAGNOSIS — E119 Type 2 diabetes mellitus without complications: Secondary | ICD-10-CM | POA: Insufficient documentation

## 2014-02-12 DIAGNOSIS — Z8639 Personal history of other endocrine, nutritional and metabolic disease: Secondary | ICD-10-CM | POA: Insufficient documentation

## 2014-02-12 DIAGNOSIS — R5383 Other fatigue: Principal | ICD-10-CM

## 2014-02-12 DIAGNOSIS — R0602 Shortness of breath: Secondary | ICD-10-CM

## 2014-02-12 DIAGNOSIS — R109 Unspecified abdominal pain: Secondary | ICD-10-CM

## 2014-02-12 DIAGNOSIS — Z87442 Personal history of urinary calculi: Secondary | ICD-10-CM | POA: Insufficient documentation

## 2014-02-12 DIAGNOSIS — Z8711 Personal history of peptic ulcer disease: Secondary | ICD-10-CM | POA: Insufficient documentation

## 2014-02-12 DIAGNOSIS — M129 Arthropathy, unspecified: Secondary | ICD-10-CM | POA: Insufficient documentation

## 2014-02-12 DIAGNOSIS — R42 Dizziness and giddiness: Secondary | ICD-10-CM | POA: Insufficient documentation

## 2014-02-12 DIAGNOSIS — Z862 Personal history of diseases of the blood and blood-forming organs and certain disorders involving the immune mechanism: Secondary | ICD-10-CM | POA: Insufficient documentation

## 2014-02-12 DIAGNOSIS — R531 Weakness: Secondary | ICD-10-CM

## 2014-02-12 DIAGNOSIS — F17200 Nicotine dependence, unspecified, uncomplicated: Secondary | ICD-10-CM | POA: Insufficient documentation

## 2014-02-12 DIAGNOSIS — I2510 Atherosclerotic heart disease of native coronary artery without angina pectoris: Secondary | ICD-10-CM | POA: Insufficient documentation

## 2014-02-12 DIAGNOSIS — Z7982 Long term (current) use of aspirin: Secondary | ICD-10-CM | POA: Insufficient documentation

## 2014-02-12 DIAGNOSIS — Z79899 Other long term (current) drug therapy: Secondary | ICD-10-CM | POA: Insufficient documentation

## 2014-02-12 DIAGNOSIS — G40309 Generalized idiopathic epilepsy and epileptic syndromes, not intractable, without status epilepticus: Secondary | ICD-10-CM | POA: Insufficient documentation

## 2014-02-12 DIAGNOSIS — R5381 Other malaise: Secondary | ICD-10-CM | POA: Insufficient documentation

## 2014-02-12 DIAGNOSIS — G8929 Other chronic pain: Secondary | ICD-10-CM | POA: Insufficient documentation

## 2014-02-12 LAB — BASIC METABOLIC PANEL
BUN: 10 mg/dL (ref 6–23)
CALCIUM: 9.7 mg/dL (ref 8.4–10.5)
CHLORIDE: 94 meq/L — AB (ref 96–112)
CO2: 24 meq/L (ref 19–32)
Creatinine, Ser: 0.72 mg/dL (ref 0.50–1.35)
GFR calc Af Amer: >90 mL/min (ref >90)
GFR calc non Af Amer: >90 mL/min (ref >90)
Glucose, Bld: 120 mg/dL — ABNORMAL HIGH (ref 70–99)
POTASSIUM: 4.1 meq/L (ref 3.7–5.3)
SODIUM: 132 meq/L — AB (ref 137–147)

## 2014-02-12 LAB — CBC
HCT: 43.9 % (ref 39.0–52.0)
HEMOGLOBIN: 15.4 g/dL (ref 13.0–17.0)
MCH: 32 pg (ref 26.0–34.0)
MCHC: 35.1 g/dL (ref 30.0–36.0)
MCV: 91.1 fL (ref 78.0–100.0)
Platelets: 148 10*3/uL — ABNORMAL LOW (ref 150–400)
RBC: 4.82 MIL/uL (ref 4.22–5.81)
RDW: 13.1 % (ref 11.5–15.5)
WBC: 4.1 10*3/uL (ref 4.0–10.5)

## 2014-02-12 LAB — I-STAT TROPONIN, ED: Troponin i, poc: 0 ng/mL (ref 0.00–0.08)

## 2014-02-12 LAB — RAPID URINE DRUG SCREEN, HOSP PERFORMED
Amphetamines: NOT DETECTED
BENZODIAZEPINES: NOT DETECTED
Barbiturates: NOT DETECTED
Cocaine: NOT DETECTED
OPIATES: NOT DETECTED
Tetrahydrocannabinol: NOT DETECTED

## 2014-02-12 LAB — PRO B NATRIURETIC PEPTIDE: Pro B Natriuretic peptide (BNP): 25.5 pg/mL (ref 0–125)

## 2014-02-12 MED ORDER — ASPIRIN 81 MG PO CHEW
324.0000 mg | CHEWABLE_TABLET | Freq: Once | ORAL | Status: DC
  Filled 2014-02-12: qty 4

## 2014-02-12 MED ORDER — ONDANSETRON HCL 4 MG/2ML IJ SOLN
4.0000 mg | Freq: Once | INTRAMUSCULAR | Status: AC
  Administered 2014-02-12: 4 mg via INTRAVENOUS
  Filled 2014-02-12: qty 2

## 2014-02-12 MED ORDER — MORPHINE SULFATE 4 MG/ML IJ SOLN
4.0000 mg | Freq: Once | INTRAMUSCULAR | Status: AC
Start: 2014-02-12 — End: 2014-02-12
  Administered 2014-02-12: 4 mg via INTRAVENOUS
  Filled 2014-02-12: qty 1

## 2014-02-12 MED ORDER — ALUM & MAG HYDROXIDE-SIMETH 200-200-20 MG/5ML PO SUSP
15.0000 mL | Freq: Once | ORAL | Status: AC
  Administered 2014-02-12: 15 mL via ORAL
  Filled 2014-02-12: qty 30

## 2014-02-12 NOTE — ED Provider Notes (Signed)
CSN: [id number]     Arrival date & time 02/12/14  1414 History   None    Chief Complaint  Patient presents with  . Chest Pain    HPI  63 year old male extensive past medical history detailed below including coronary artery disease here with several complaints including left-sided neck pain, left shoulder pain, shortness of breath, lightheadedness, abdominal pain. His symptoms are largely chronic.   He complains most prominently of neck pain.  He has chronic neck pain.  No acute injury.  No acute worsening of his neck pain.  He reports "it's just not getting better."  He uses a TENS unit at home.  He has been evaluated with similar complaints before.  Pain is moderate.  It is aching and stabbing.  It radiates to his left shoulder, left trapezius, and left arm.  It is constant, for months.  It is aggravated by nothing, relieved by nothing.  He complains of chronic shortness of breath.  SOB is mild.  He is able to walk long distances, but states his SOB gets worse when he walks.  He has been evaluated for this many times before also.  The pattern is unchanged.  No associated fever, chills, cough, wheezing, or chest pain.  No edema.  No treatments tried.  Relieved by nothing.  Intermittent.  Can occur at rest or with exertion.  He has a history of ischemic cardiomyopathy and coronary disease, but no COPD.  He does smoke cigarrettes.  He occasionally smoke marijuana.  Former cocaine use.  He complains of abdominal pain.  Pain is burning "like my stomach is on fire."  It is poorly localized to his upper abdomen.  He has a history of peptic ulcer disease.  Has been evaluated for similar pain many times.  Is followed by GI.  Is on Protonix and Sucralfate.  He reports that the medications work well, but admits that he sometimes stops taking these medicines for long periods of time "because I feel better" and then his stomach starts to hurt again.  He resumed taking his protonix and his sucralfate one week ago,  but is not feeling better yet.  No nausea, vomiting, diarrhea, constipation, hematemesis, blood per rectum, abdominal distension, weight loss, dysphagia, or other symptoms.   He was evaluated for very similar complaints back in January of this year.  At that time, it was felt he warranted workup for possible atypical ACS given his risk factors and history.  He had a heart catheterization that showed his stent and large vessels were widely patent.  He had occlusion of the OM1, but with good collateral blood flow.  He was medically managed.  His symptoms were deemed non-cardiac.  He denies any chest pain.   Past Medical History  Diagnosis Date  . Hyperlipidemia   . Hypertension   . Leg pain   . Peripheral vascular disease     s/p prior Ao-Bifem BPG  . 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 Coronary artery disease     a. NSTEMI 7/14=> LHC 06/07/13: Proximal LAD 30%, ostial D1 30%, AV circumflex 80%, then occluded, ostial OM2 occluded, left to left collaterals, mid RCA 95%, inferior HK, EF 60%. PCI: Promus DES to the mid RCA.  No intervention was recommended for the CTO circumflex.  . Ischemic cardiomyopathy     a. Echocardiogram 06/06/13: EF 35-40%, inferior AK, mild MR.  Marland Kitchen Shortness of breath     "  can come on at anytime lately; before that it was just w/exertion" (11/10/2013)  . Type II diabetes mellitus   . Grand mal seizure     "last one was ~ 3 yr ago; controlled w/daily RX" (11/10/2013)  . Arthritis     "neck, back, legs" (11/10/2013)  . Kidney stones     "when I was younger; passed w/o treatment" (11/10/2013)   Past Surgical History  Procedure Laterality Date  . Iliac artery stent  05/06/2011  . Tonsillectomy    . Aorta - bilateral femoral artery bypass graft  11/25/2011    Procedure: AORTA BIFEMORAL BYPASS GRAFT;  Surgeon: Rosetta Posner, MD;  Location: [place];  Service: Vascular;  Laterality: N/A;  . Pr vein bypass graft,aorto-fem-pop   11/24/2012  . Inguinal hernia repair Right    Family History  Problem Relation Age of Onset  . Hypertension Mother   . Heart attack Mother   . Diabetes Mother   . Aneurysm Father   . Heart attack Sister   . Heart disease Brother     heart transplant   History  Substance Use Topics  . Smoking status: Current Every Day Smoker -- 1.00 packs/day for 46 years    Types: Cigarettes  . Smokeless tobacco: Never Used  . Alcohol Use: Yes     Comment: a12/17/20214 "last drink was 3-4 yr ago; never had problem w/it"    Review of Systems  Constitutional: Negative for fever and chills.  HENT: Negative for congestion and rhinorrhea.   Eyes: Negative for visual disturbance.  Respiratory: Positive for shortness of breath. Negative for cough.   Cardiovascular: Negative for chest pain and leg swelling.  Gastrointestinal: Positive for abdominal pain. Negative for nausea, vomiting and diarrhea.  Genitourinary: Negative for dysuria, urgency, frequency, flank pain and difficulty urinating.  Musculoskeletal: Positive for neck pain. Negative for back pain and neck stiffness.  Skin: Negative for rash.  Neurological: Negative for syncope, weakness, numbness and headaches.  All other systems reviewed and are negative.      Allergies  Penicillins and Zetia  Home Medications   Current Outpatient Rx  Name  Route  Sig  Dispense  Refill  . albuterol (PROVENTIL HFA;VENTOLIN HFA) 108 (90 BASE) MCG/ACT inhaler   Inhalation   Inhale 2 puffs into the lungs every 6 (six) hours as needed for wheezing or shortness of breath.   1 Inhaler   2   . aspirin EC 81 MG tablet   Oral   Take 81 mg by mouth daily.         . clopidogrel (PLAVIX) 75 MG tablet   Oral   Take 75 mg by mouth daily with breakfast.         . glipiZIDE (GLUCOTROL) 10 MG tablet   Oral   Take 10 mg by mouth 2 (two) times daily before a meal.         . nitroGLYCERIN (NITROSTAT) 0.4 MG SL tablet   Sublingual   Place 0.4 mg  under the tongue every 5 (five) minutes as needed for chest pain.         Marland Kitchen omeprazole (PRILOSEC) 40 MG capsule   Oral   Take 40 mg by mouth daily.         . Oxycodone HCl 10 MG TABS   Oral   Take 10 mg by mouth every 6 (six) hours as needed (for pain).         . phenytoin (DILANTIN) 100 MG ER capsule  Oral   Take 200-300 mg by mouth 2 (two) times daily. Take 200 mg in the morning & 300 mg in the evening.         . polyethylene glycol (MIRALAX / GLYCOLAX) packet   Oral   Take 17 g by mouth daily as needed for mild constipation.          . sucralfate (CARAFATE) 1 G tablet   Oral   Take 1 g by mouth 4 (four) times daily -  with meals and at bedtime.         . triamcinolone cream (KENALOG) 0.1 %   Topical   Apply 1 application topically 2 (two) times daily as needed (for eczema).         . zolpidem (AMBIEN) 10 MG tablet   Oral   Take 10 mg by mouth at bedtime as needed for sleep.          BP 134/80  Pulse 51  Temp(Src) 97.5 F (36.4 C) (Oral)  Resp 20  Ht 6\' 3"  (1.905 m)  Wt 165 lb (74.844 kg)  BMI 20.62 kg/m2  SpO2 94% Physical Exam  Nursing note and vitals reviewed. Constitutional: He is oriented to person, place, and time. He appears well-developed and well-nourished. No distress.  HENT:  Head: Normocephalic and atraumatic.  Mouth/Throat: Oropharynx is clear and moist.  Eyes: Conjunctivae and EOM are normal. Pupils are equal, round, and reactive to light. No scleral icterus.  Neck: Normal range of motion. Neck supple. No JVD present.  Cardiovascular: Normal rate, regular rhythm, normal heart sounds and intact distal pulses.  Exam reveals no gallop and no friction rub.   No murmur heard. Pulses:      Radial pulses are 2+ on the right side, and 2+ on the left side.       Femoral pulses are 2+ on the right side, and 2+ on the left side. Pulmonary/Chest: Effort normal and breath sounds normal. No respiratory distress. He has no wheezes. He has no  rales.  Abdominal: Soft. Normal appearance, normal aorta and bowel sounds are normal. He exhibits no distension and no ascites. There is no tenderness. There is no rigidity, no rebound, no guarding, no CVA tenderness and negative Murphy's sign. No hernia.  Musculoskeletal: He exhibits no edema.       Cervical back: Normal. He exhibits normal range of motion, no bony tenderness, no deformity and no spasm.  Tenderness to palpation over left lateral neck soft tissues, left trapezius region.  Neurological: He is alert and oriented to person, place, and time. No cranial nerve deficit. He exhibits normal muscle tone. Coordination normal.  Skin: Skin is warm and dry. He is not diaphoretic.    ED Course  Procedures (including critical care time) Labs Review Labs Reviewed  BASIC METABOLIC PANEL - Abnormal; Notable for the following:    Sodium 132 (*)    Chloride 94 (*)    Glucose, Bld 120 (*)    All other components within normal limits  CBC - Abnormal; Notable for the following:    Platelets 148 (*)    All other components within normal limits  PRO B NATRIURETIC PEPTIDE  URINE RAPID DRUG SCREEN (HOSP PERFORMED)  I-STAT TROPOININ, ED   Imaging Review Dg Chest 2 View  02/12/2014   CLINICAL DATA:  Left-sided neck pain and left shoulder pain. Left sternal chest pain for 1 day.  EXAM: CHEST  2 VIEW  COMPARISON:  CT ANGIO CHEST W/CM &/OR WO/CM  dated 11/11/2013; DG CHEST 2 VIEW dated 11/07/2013; CT ANGIO CHEST W/CM &/OR WO/CM dated 10/24/2013  FINDINGS: The heart size and mediastinal contours are within normal limits. There are bilateral emphysematous changes. Both lungs are clear. The visualized skeletal structures are unremarkable.  IMPRESSION: No active cardiopulmonary disease.   Electronically Signed   By: Kathreen Devoid   On: 02/12/2014 17:19   Dg Cervical Spine 2-3 Views  02/12/2014   CLINICAL DATA:  Left-sided neck pain  EXAM: CERVICAL SPINE - 2-3 VIEW  COMPARISON:  None.  FINDINGS: The cervical  spine is visualized to the level of C7-T1.  The vertebral body heights are maintained. The alignment is normal. The prevertebral soft tissues are normal. There is no acute fracture or static listhesis. There is severe degenerative disc disease at C5-6 with disc space narrowing and discogenic endplate osteophytes.  IMPRESSION: Degenerative disc disease at C5-6.   Electronically Signed   By: Kathreen Devoid   On: 02/12/2014 17:18     EKG Interpretation   Date/Time:  Saturday February 12 2014 14:19:44 EDT Ventricular Rate:  60 PR Interval:  154 QRS Duration: 98 QT Interval:  444 QTC Calculation: 444 R Axis:   87 Text Interpretation:  Sinus rhythm with occasional Premature ventricular  complexes Nonspecific ST abnormality No significant change since last  tracing Confirmed by Ashok Cordia  MD, Lennette Bihari ([id number]) on 02/12/2014 3:24:55 PM      MDM   [name] is a 63 y.o. male with h/o CAD, peptic ulcer disease, and chronic neck pain, chronic SOB, here with multiple complaints detailed above. No chest pain. He is afebrile, mildly hypertensive, and otherwise has normal vitals.  He is well appearing, in NAD.  Normal neurologic, cardiopulmonary, abd exam.  Left lateral neck tenderness, no midline tenderness. EKG with no acute ischemic changes. Troponin negative X 1. XR C spine with degenerative changes. Essentially normal CBC, BMP, BNP.  His symptoms are chronic, nothing acute.  He has been evaluated for similar complaints multiple times.  He had a cardiac cath in January to evaluate for atypical ACS.  He had patent vessels, except OM1 occlusion with good collaterals.  Pain was symptoms were deemed non-cardiac.  Medically managed for chronic CAD.  My impression is: 1.  Chronic neck pain 2/2 degenerative changes, possibly with pinched nerve. - manage conservatively with tylenol TENS unit, PCP followup 2.  Chronic abd pain.  Burning, suggestive of dyspepsia in patient with long h/o PUD. - followed by  GI.  Not compliant with meds consistently.  Advised he continue his sucralfate, PPI, and may take Maalox prn.  Otherwise, will defer to GI.  Imaging not warranted.  abd soft and non-tender. 3.  Chronic SOB.   - No evidence of PNA, CHF, or ACS.  Long time smoker, but no wheezing or prolonged expiration.  Doubt cardiac etiology.  No acute management.  He is able to walk without difficulty.  He has been evaluated for these complaints in the ED many times.  I advised him PCP followup is important to sort out his chronic medical issues.  Gave ED return precautions.  DC home.   Final diagnoses:  Generalized weakness  Neck pain  Abdominal pain  Shortness of breath       Wendall Papa, MD 02/13/14 1355

## 2014-02-12 NOTE — Discharge Instructions (Signed)
Abdominal Pain, Adult °Many things can cause abdominal pain. Usually, abdominal pain is not caused by a disease and will improve without treatment. It can often be observed and treated at home. Your health care provider will do a physical exam and possibly order blood tests and X-rays to help determine the seriousness of your pain. However, in many cases, more time must pass before a clear cause of the pain can be found. Before that point, your health care provider may not know if you need more testing or further treatment. °HOME CARE INSTRUCTIONS  °Monitor your abdominal pain for any changes. The following actions may help to alleviate any discomfort you are experiencing: °· Only take over-the-counter or prescription medicines as directed by your health care provider. °· Do not take laxatives unless directed to do so by your health care provider. °· Try a clear liquid diet (broth, tea, or water) as directed by your health care provider. Slowly move to a bland diet as tolerated. °SEEK MEDICAL CARE IF: °· You have unexplained abdominal pain. °· You have abdominal pain associated with nausea or diarrhea. °· You have pain when you urinate or have a bowel movement. °· You experience abdominal pain that wakes you in the night. °· You have abdominal pain that is worsened or improved by eating food. °· You have abdominal pain that is worsened with eating fatty foods. °SEEK IMMEDIATE MEDICAL CARE IF:  °· Your pain does not go away within 2 hours. °· You have a fever. °· You keep throwing up (vomiting). °· Your pain is felt only in portions of the abdomen, such as the right side or the left lower portion of the abdomen. °· You pass bloody or black tarry stools. °MAKE SURE YOU: °· Understand these instructions.   °· Will watch your condition.   °· Will get help right away if you are not doing well or get worse.   °Document Released: 08/21/2005 Document Revised: 09/01/2013 Document Reviewed: 07/21/2013 °[employer]® Patient  Information ©2014 [employer]. ° °

## 2014-02-12 NOTE — ED Notes (Signed)
Pt concerned about taking aspirin due to ulcers in his stomach and several aorta operations in the past. EDP and resident aware and ordered D/C.

## 2014-02-12 NOTE — ED Notes (Signed)
Pt presents with left sternal CP X 1 day  that radiates to left shoulder and left side of neck. with SHOB, N, lightheadness.  Pt denies any vomitting. Condition is worse with "excertion" condtion is made better by nothing. Pt reports a similar episode 3 weeks ago and states that he was seen here at this facility. Pt has a cardiac history and reports that he has 1 stent

## 2014-02-13 NOTE — ED Provider Notes (Signed)
I saw and evaluated the patient, reviewed the resident's note and I agree with the findings and plan.   EKG Interpretation   Date/Time:  Saturday February 12 2014 14:19:44 EDT Ventricular Rate:  60 PR Interval:  154 QRS Duration: 98 QT Interval:  444 QTC Calculation: 444 R Axis:   87 Text Interpretation:  Sinus rhythm with occasional Premature ventricular  complexes Nonspecific ST abnormality No significant change since last  tracing Confirmed by Lilit Cinelli  MD, Lennette Bihari ([id number]) on 02/12/2014 3:24:55 PM      Pt c/o left shoulder area pain x past few days. Constant. At rest. Not pleuritic. Area tender. No sts. Chest cta. Labs. Cxr.    Mirna Mires, MD 02/13/14 [phone]

## 2014-03-09 ENCOUNTER — Ambulatory Visit: Payer: Medicaid Other | Admitting: Cardiovascular Disease

## 2014-03-23 ENCOUNTER — Telehealth: Payer: Self-pay | Admitting: Cardiovascular Disease

## 2014-03-23 NOTE — Telephone Encounter (Signed)
I spoke with the pt and he is on a treatment plan by his PCP for IBS/Crohns disease. The pt stopped ASA on his own and when he made the PCP aware they told him to contact cardiology.  I confirmed with the pt that he is still taking plavix.  I advised the pt that at this time he should remain on plavix and when his GI issues are improving then he can restart ASA 81mg .  I will make Dr Burt Knack aware of my conversation with the pt.

## 2014-03-23 NOTE — Telephone Encounter (Signed)
New message      Pt is being treated for IBS or Chrons disease----he is on 81mg  of aspirin.  He has stopped taking it for 2wks because it is irritating his stomach.  Should he take something else in place of the aspirin?

## 2014-03-23 NOTE — Telephone Encounter (Signed)
Agree with plan thx!

## 2014-04-07 ENCOUNTER — Ambulatory Visit: Payer: Medicaid Other | Admitting: Cardiovascular Disease

## 2014-04-07 ENCOUNTER — Ambulatory Visit (INDEPENDENT_AMBULATORY_CARE_PROVIDER_SITE_OTHER): Payer: Medicaid Other | Admitting: Physician Assistant

## 2014-04-07 ENCOUNTER — Encounter: Payer: Self-pay | Admitting: Physician Assistant

## 2014-04-07 VITALS — BP 140/78 | HR 55 | Ht 75.0 in | Wt 149.8 lb

## 2014-04-07 DIAGNOSIS — E785 Hyperlipidemia, unspecified: Secondary | ICD-10-CM

## 2014-04-07 DIAGNOSIS — I2589 Other forms of chronic ischemic heart disease: Secondary | ICD-10-CM

## 2014-04-07 DIAGNOSIS — I2510 Atherosclerotic heart disease of native coronary artery without angina pectoris: Secondary | ICD-10-CM

## 2014-04-07 DIAGNOSIS — Z720 Tobacco use: Secondary | ICD-10-CM

## 2014-04-07 DIAGNOSIS — I739 Peripheral vascular disease, unspecified: Secondary | ICD-10-CM

## 2014-04-07 DIAGNOSIS — F17200 Nicotine dependence, unspecified, uncomplicated: Secondary | ICD-10-CM

## 2014-04-07 NOTE — Progress Notes (Signed)
[place], [place], [place]  [address] Phone: [phone] Fax:  [phone]  Date:  04/07/2014   ID:  [name] [name], DOB [birth year], MRN [id number]  PCP:  Marijean Bravo, MD  Cardiologist:  Dr. Sherren Mocha      History of Present Illness: [name] [name] is a 63 y.o. male with a hx of CAD s/p NSTEMI tx with Promus DES to mid RCA (CTO of OM1 tx medically), ischemic CM with EF 35-40%, PAD s/p aortobifemoral bypass, HTN, HL, DM, seizure d/o.  EF has recovered.  Last seen by Dr. Sherren Mocha in the office in 11/2013.  Patient had multiple ER and hospital evaluations for chest pain.  Nuclear study was done and was noted to be intermediate risk.  LHC was arranged.  This demonstrated patent RCA stent, CTO of OM1 and no significant CAD elsewhere.  Med Rx was continued.    He returns for follow up.  He denies further chest pain.  He did have another visit to the ED in 01/2014 with chest pain.  Symptoms were felt to be GI.  He is being followed by his PCP for possible IBS.  He notes chronic DOE.  He also admits to wheezing. He continues to smoke. He has a chronic cough. He denies orthopnea, PND or edema. He denies syncope.   Studies:  - LHC (11/2013):  Mid LAD 30%, OM1 occluded (L-L collats), prox RCA stent ok, EF 55-65%.    - Echo (07/2013):  EF 55-60%, no RWMA.  - Nuclear (11/2013):  Inf and lat infarct with mild peri-infarct ischemia, EF 41%; intermediate risk  - ABI (07/2013):  R 0.65; L 0.65.   Recent Labs: 06/06/2013: HDL Cholesterol 39*; LDL (calc) 207*; TSH 1.462  11/11/2013: ALT 10  02/12/2014: Creatinine 0.72; Hemoglobin 15.4; Potassium 4.1; Pro B Natriuretic peptide (BNP) 25.5   Wt Readings from Last 3 Encounters:  02/12/14 165 lb (74.844 kg)  12/10/13 160 lb (72.576 kg)  12/10/13 160 lb (72.576 kg)     Past Medical History  Diagnosis Date  . Hyperlipidemia   . Hypertension   . Leg pain   . Peripheral vascular disease     s/p prior Ao-Bifem BPG  .  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 Coronary artery disease     a. NSTEMI 7/14=> LHC 06/07/13: Proximal LAD 30%, ostial D1 30%, AV circumflex 80%, then occluded, ostial OM2 occluded, left to left collaterals, mid RCA 95%, inferior HK, EF 60%. PCI: Promus DES to the mid RCA.  No intervention was recommended for the CTO circumflex.  . Ischemic cardiomyopathy     a. Echocardiogram 06/06/13: EF 35-40%, inferior AK, mild MR.  Marland Kitchen Shortness of breath     "can come on at anytime lately; before that it was just w/exertion" (11/10/2013)  . Type II diabetes mellitus   . Grand mal seizure     "last one was ~ 3 yr ago; controlled w/daily RX" (11/10/2013)  . Arthritis     "neck, back, legs" (11/10/2013)  . Kidney stones     "when I was younger; passed w/o treatment" (11/10/2013)    Current Outpatient Prescriptions  Medication Sig Dispense Refill  . albuterol (PROVENTIL HFA;VENTOLIN HFA) 108 (90 BASE) MCG/ACT inhaler Inhale 2 puffs into the lungs every 6 (six) hours as needed for wheezing or shortness of breath.  1 Inhaler  2  . aspirin EC  81 MG tablet Take 81 mg by mouth daily.      . clopidogrel (PLAVIX) 75 MG tablet Take 75 mg by mouth daily with breakfast.      . glipiZIDE (GLUCOTROL) 10 MG tablet Take 10 mg by mouth 2 (two) times daily before a meal.      . nitroGLYCERIN (NITROSTAT) 0.4 MG SL tablet Place 0.4 mg under the tongue every 5 (five) minutes as needed for chest pain.      Marland Kitchen omeprazole (PRILOSEC) 40 MG capsule Take 40 mg by mouth daily.      . Oxycodone HCl 10 MG TABS Take 10 mg by mouth every 6 (six) hours as needed (for pain).      . phenytoin (DILANTIN) 100 MG ER capsule Take 200-300 mg by mouth 2 (two) times daily. Take 200 mg in the morning & 300 mg in the evening.      . polyethylene glycol (MIRALAX / GLYCOLAX) packet Take 17 g by mouth daily as needed for mild constipation.       . sucralfate (CARAFATE) 1 G tablet Take 1 g by mouth 4  (four) times daily -  with meals and at bedtime.      . triamcinolone cream (KENALOG) 0.1 % Apply 1 application topically 2 (two) times daily as needed (for eczema).      . zolpidem (AMBIEN) 10 MG tablet Take 10 mg by mouth at bedtime as needed for sleep.       No current facility-administered medications for this visit.    Allergies:   Penicillins and Zetia   Social History:  The patient  reports that he has been smoking Cigarettes.  He has a 46 pack-year smoking history. He has never used smokeless tobacco. He reports that he drinks alcohol. He reports that he uses illicit drugs (Marijuana and Cocaine) about once per week.   Family History:  The patient's family history includes Aneurysm in his father; Diabetes in his mother; Heart attack in his mother and sister; Heart disease in his brother; Hypertension in his mother.   ROS:  Please see the history of present illness.      All other systems reviewed and negative.   PHYSICAL EXAM: VS:  BP 140/78  Pulse 55  Ht 6\' 3"  (1.905 m)  Wt 149 lb 12.8 oz (67.949 kg)  BMI 18.72 kg/m2 Well nourished, well developed, in no acute distress HEENT: normal Neck: no JVD Vascular: No carotid bruits Cardiac:  normal S1, S2; RRR; no murmur Lungs:  Decreased breath sounds bilaterally, no wheezing, rhonchi or rales Abd: soft, nontender, no hepatomegaly Ext: no edema Skin: warm and dry Neuro:  CNs 2-12 intact, no focal abnormalities noted  EKG:  Sinus bradycardia, HR 55, normal axis, no ST changes     ASSESSMENT AND PLAN:  1. CAD (coronary artery disease): No angina. Continue aspirin, Plavix. He is intolerant to statins. 2. Ischemic Cardiomyopathy with recovery of LV function: Ejection fraction has recovered to normal. He has been intolerant of ACE inhibitors and ARBs.  He cannot take beta blockers secondary to bradycardia. 3. PVD (peripheral vascular disease) with claudication: Continue followup with vascular surgery. 4. Dyslipidemia: He has  significantly elevated LDL. He is intolerant to statins. I have recommended referral to the lipid clinic. He may qualify for one of PCSK9 trials.  5. Tobacco abuse:  We discussed the importance of quitting. 6. Disposition: Followup with Dr. Burt Knack in 6 months.   Signed, Richardson Dopp, PA-C  04/07/2014 3:06 PM

## 2014-04-07 NOTE — Patient Instructions (Signed)
YOU HAVE BEEN REFERRED TO THE LIPID CLINIC  Your physician recommends that you continue on your current medications as directed. Please refer to the Current Medication list given to you today.   Your physician wants you to follow-up in: [place]. You will receive a reminder letter in the mail two months in advance. If you don't receive a letter, please call our office to schedule the follow-up appointment.

## 2014-04-08 ENCOUNTER — Telehealth: Payer: Self-pay

## 2014-04-08 DIAGNOSIS — I739 Peripheral vascular disease, unspecified: Secondary | ICD-10-CM

## 2014-04-08 DIAGNOSIS — Z95828 Presence of other vascular implants and grafts: Secondary | ICD-10-CM

## 2014-04-08 NOTE — Telephone Encounter (Signed)
Phone call from pt.  Stated he has been "more aware of the blood gushing through the bypass graft on the right side".  C/o aching in feet and legs x 2 days.  Reported the aching in the legs have stopped, "but my feet hurt like crazy".  Stated he has Diabetic Neuropathy.  Denies open sores on lower extremities/feet.  Reported he has intermittent pain in bil. legs, at night.  Stated he has cramping in bilat. calves when walks for extended periods of time; reported the cramping eases-up with rest. Advised will contact pt. with appt. w/ Dr. Donnetta Hutching.  Advised to call if symptoms worsen prior to appt.  Verb. understanding.

## 2014-04-11 NOTE — Telephone Encounter (Signed)
Spoke with pt to arrange an appointment for labs on 06/03 and to see TFE on 06/09. He was appreciative and stated "you all do good work". He is happy to get "the ball rolling". Very pleasant and will be here on 04/27/14. Dpm

## 2014-04-14 ENCOUNTER — Ambulatory Visit: Payer: Medicaid Other | Admitting: Pharmacist

## 2014-04-21 ENCOUNTER — Ambulatory Visit (INDEPENDENT_AMBULATORY_CARE_PROVIDER_SITE_OTHER): Payer: Medicaid Other | Admitting: Pharmacist

## 2014-04-21 VITALS — Wt 150.0 lb

## 2014-04-21 DIAGNOSIS — E785 Hyperlipidemia, unspecified: Secondary | ICD-10-CM

## 2014-04-21 MED ORDER — ROSUVASTATIN CALCIUM 5 MG PO TABS: 5.0000 mg | ORAL_TABLET | Freq: Every day | ORAL | Status: DC

## 2014-04-21 NOTE — Patient Instructions (Signed)
1.  Start Crestor 5 mg once daily. 2.  If muscle aches occur, call Ysidro Evert at [phone]. Will try to get into study.   3.  If can't get into study, will see if we can get the drug approved in 06/2014.  4.  If able to tolerate Crestor, will plan on rechecking cholesterol in a few months.  Call Ysidro Evert if you run out of samples and are tolerating it.

## 2014-04-21 NOTE — Assessment & Plan Note (Signed)
Patient hasn't failed lowest possible dose of statin it seems to qualify him for study, so he is willing to try Crestor 5 mg once daily.  If he can't tolerate this, will try to enroll him into SPIRE-II.  If we can't get him into study for any reason, will try to get him on one of the agents later this year, if they are covered by Medicaid. Plan: 1.  Start Crestor 5 mg once daily. 2.  If muscle aches occur, call Ysidro Evert at [phone]. Will try to get into study.   3.  If can't get into study, will see if we can get the drug approved in 06/2014.  4.  If able to tolerate Crestor, will plan on rechecking cholesterol in a few months.  Call Ysidro Evert if you run out of samples and are tolerating it.

## 2014-04-21 NOTE — Progress Notes (Signed)
Patient of Dr. Burt Knack, referred to lipid clinic by Richardson Dopp, PA to see if he qualifies for [facility] clinical trial.  Patient had an MI 05/2013, and has failed multiple statins due to severe muscle aches.  He has documented failure to pravastatin 40 mg at just three times per week, and lipitor 80 mg qd.  Also failed Zetia.  Patient's most recent LDL was 207 mg/dL with a goal of < 70 mg/dL.  He tells me that he used cocaine when he was younger, and feels this is what caused much of his heart disease, but tells me he hasn't used this in over 20 years.  He does smoke marijuana periodically, but this is not often.  He does not have h/o cancer. He also has Type II diabetes and PVD so would benefit from aggressive lipid lowering therapy.  He understands the [facility] clinical trial uses a SQ medication every 2 weeks, and is placebo controlled.  Of note, he has Medicaid, and don't know if the PCSK-9 agents will be covered by this in near future.  Current Outpatient Prescriptions  Medication Sig Dispense Refill  . albuterol (PROVENTIL HFA;VENTOLIN HFA) 108 (90 BASE) MCG/ACT inhaler Inhale 2 puffs into the lungs every 6 (six) hours as needed for wheezing or shortness of breath.  1 Inhaler  2  . aspirin EC 81 MG tablet Take 81 mg by mouth daily.      . clopidogrel (PLAVIX) 75 MG tablet Take 75 mg by mouth daily with breakfast.      . esomeprazole (NEXIUM) 40 MG capsule Take 40 mg by mouth daily at 12 noon.      Marland Kitchen glipiZIDE (GLUCOTROL) 10 MG tablet Take 10 mg by mouth 2 (two) times daily before a meal.      . mirtazapine (REMERON) 15 MG tablet Take 15 mg by mouth at bedtime.      . nitroGLYCERIN (NITROSTAT) 0.4 MG SL tablet Place 0.4 mg under the tongue every 5 (five) minutes as needed for chest pain.      . Oxycodone HCl 10 MG TABS Take 10 mg by mouth every 6 (six) hours as needed (for pain).      . phenytoin (DILANTIN) 100 MG ER capsule Take 200-300 mg by mouth 2 (two) times daily. Take 200 mg in the morning & 300 mg  in the evening.      . polyethylene glycol (MIRALAX / GLYCOLAX) packet Take 17 g by mouth daily as needed for mild constipation.       . sucralfate (CARAFATE) 1 G tablet Take 1 g by mouth 4 (four) times daily -  with meals and at bedtime.      . triamcinolone cream (KENALOG) 0.1 % Apply 1 application topically 2 (two) times daily as needed (for eczema).      . zolpidem (AMBIEN) 10 MG tablet Take 10 mg by mouth at bedtime as needed for sleep.       No current facility-administered medications for this visit.   Allergies  Allergen Reactions  . Atorvastatin Other (See Comments)    Malaise & muscle weakness  . Penicillins Other (See Comments)    Unknown  . Pravastatin Sodium     Pravastatin 40 mg qday and 40 mg q M/W/F caused muscle aches  . Zetia [Ezetimibe] Swelling   Family History  Problem Relation Age of Onset  . Hypertension Mother   . Heart attack Mother   . Diabetes Mother   . Aneurysm Father   .  Heart attack Sister   . Heart disease Brother     heart transplant

## 2014-04-27 ENCOUNTER — Ambulatory Visit (INDEPENDENT_AMBULATORY_CARE_PROVIDER_SITE_OTHER)
Admission: RE | Admit: 2014-04-27 | Discharge: 2014-04-27 | Disposition: A | Discharge status: Home / Self Care | Payer: Medicaid Other | Source: Ambulatory Visit | Attending: Vascular Surgery | Admitting: Vascular Surgery

## 2014-04-27 ENCOUNTER — Ambulatory Visit (HOSPITAL_COMMUNITY)
Admission: RE | Admit: 2014-04-27 | Discharge: 2014-04-27 | Disposition: A | Discharge status: Home / Self Care | Payer: Medicaid Other | Source: Ambulatory Visit | Attending: Vascular Surgery | Admitting: Vascular Surgery

## 2014-04-27 DIAGNOSIS — I739 Peripheral vascular disease, unspecified: Secondary | ICD-10-CM

## 2014-04-27 DIAGNOSIS — Z95828 Presence of other vascular implants and grafts: Secondary | ICD-10-CM

## 2014-04-27 DIAGNOSIS — Z9889 Other specified postprocedural states: Secondary | ICD-10-CM

## 2014-04-27 DIAGNOSIS — Z48812 Encounter for surgical aftercare following surgery on the circulatory system: Secondary | ICD-10-CM | POA: Insufficient documentation

## 2014-05-02 ENCOUNTER — Encounter: Payer: Self-pay | Admitting: Vascular Surgery

## 2014-05-03 ENCOUNTER — Ambulatory Visit (INDEPENDENT_AMBULATORY_CARE_PROVIDER_SITE_OTHER): Payer: Medicaid Other | Admitting: Vascular Surgery

## 2014-05-03 ENCOUNTER — Encounter: Payer: Self-pay | Admitting: Vascular Surgery

## 2014-05-03 VITALS — BP 112/77 | HR 57 | Resp 18 | Ht 74.0 in | Wt 154.3 lb

## 2014-05-03 DIAGNOSIS — I739 Peripheral vascular disease, unspecified: Secondary | ICD-10-CM

## 2014-05-03 NOTE — Progress Notes (Signed)
Patient has today for discussion of lower surety arterial insufficiency. He is well-known to me from prior aortobifemoral bypass for occlusive disease December 2012 he had a prior fem-fem bypass with Sussan Meter occlusion with limited flow in his donor artery. He reports a catching sensation in his right lower quadrant. He does have a hernia which has been known for quite some time prior to surgery. He does have chronic achy sensation in both legs which is not related to walking. He has no tissue loss in his lower extremities  Past Medical History  Diagnosis Date  . Hyperlipidemia   . Hypertension   . Leg pain   . Peripheral vascular disease     s/p prior Ao-Bifem BPG  . 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 Coronary artery disease     a. NSTEMI 7/14=> LHC 06/07/13: Proximal LAD 30%, ostial D1 30%, AV circumflex 80%, then occluded, ostial OM2 occluded, left to left collaterals, mid RCA 95%, inferior HK, EF 60%. PCI: Promus DES to the mid RCA.  No intervention was recommended for the CTO circumflex.  . Ischemic cardiomyopathy     a. Echocardiogram 06/06/13: EF 35-40%, inferior AK, mild MR.  Marland Kitchen Shortness of breath     "can come on at anytime lately; before that it was just w/exertion" (11/10/2013)  . Type II diabetes mellitus   . Grand mal seizure     "last one was ~ 3 yr ago; controlled w/daily RX" (11/10/2013)  . Arthritis     "neck, back, legs" (11/10/2013)  . Kidney stones     "when I was younger; passed w/o treatment" (11/10/2013)    History  Substance Use Topics  . Smoking status: Current Every Day Smoker -- 1.00 packs/day for 46 years    Types: Cigarettes  . Smokeless tobacco: Never Used  . Alcohol Use: Yes     Comment: [id number]/17/20214 "last drink was 3-4 yr ago; never had problem w/it"    Family History  Problem Relation Age of Onset  . Hypertension Mother   . Heart attack Mother   . Diabetes Mother   . Aneurysm Father   . Heart  attack Sister   . Heart disease Brother     heart transplant    Allergies  Allergen Reactions  . Atorvastatin Other (See Comments)    Malaise & muscle weakness  . Penicillins Other (See Comments)    Unknown  . Pravastatin Sodium     Pravastatin 40 mg qday and 40 mg q M/W/F caused muscle aches  . Zetia [Ezetimibe] Swelling    Current outpatient prescriptions:albuterol (PROVENTIL HFA;VENTOLIN HFA) 108 (90 BASE) MCG/ACT inhaler, Inhale 2 puffs into the lungs every 6 (six) hours as needed for wheezing or shortness of breath., Disp: 1 Inhaler, Rfl: 2;  aspirin EC 81 MG tablet, Take 81 mg by mouth daily., Disp: , Rfl: ;  clopidogrel (PLAVIX) 75 MG tablet, Take 75 mg by mouth daily with breakfast., Disp: , Rfl:  esomeprazole (NEXIUM) 40 MG capsule, Take 40 mg by mouth daily at 12 noon., Disp: , Rfl: ;  glipiZIDE (GLUCOTROL) 10 MG tablet, Take 10 mg by mouth 2 (two) times daily before a meal., Disp: , Rfl: ;  mirtazapine (REMERON) 15 MG tablet, Take 15 mg by mouth at bedtime., Disp: , Rfl: ;  nitroGLYCERIN (NITROSTAT) 0.4 MG SL tablet, Place 0.4 mg under the tongue every 5 (five) minutes as needed for chest pain., Disp: ,  Rfl:  Oxycodone HCl 10 MG TABS, Take 10 mg by mouth every 6 (six) hours as needed (for pain)., Disp: , Rfl: ;  phenytoin (DILANTIN) 100 MG ER capsule, Take 200-300 mg by mouth 2 (two) times daily. Take 200 mg in the morning & 300 mg in the evening., Disp: , Rfl: ;  polyethylene glycol (MIRALAX / GLYCOLAX) packet, Take 17 g by mouth daily as needed for mild constipation. , Disp: , Rfl:  rosuvastatin (CRESTOR) 5 MG tablet, Take 1 tablet (5 mg total) by mouth daily., Disp: 90 tablet, Rfl: 3;  sucralfate (CARAFATE) 1 G tablet, Take 1 g by mouth 4 (four) times daily -  with meals and at bedtime., Disp: , Rfl: ;  triamcinolone cream (KENALOG) 0.1 %, Apply 1 application topically 2 (two) times daily as needed (for eczema)., Disp: , Rfl: ;  zolpidem (AMBIEN) 10 MG tablet, Take 10 mg by mouth at  bedtime as needed for sleep., Disp: , Rfl:   BP 112/77  Pulse 57  Resp 18  Ht 6\' 2"  (1.88 m)  Wt 154 lb 4.8 oz (69.99 kg)  BMI 19.80 kg/m2  Body mass index is 19.8 kg/(m^2).       Physical exam: He has 2+ radial and 2+ femoral pulses bilaterally. He has an easily palpable graft pulses and graft anastomoses bilaterally with no evidence of false aneurysm. He does have an easily reducible right inguinal hernia. No tenderness. He has no palpable popliteal or distal pulses. His feet are well-perfused bilaterally with no tissue loss Neurologically he is grossly intact Respirations are equal and nonlabored  He did undergo noninvasive vascular lab studies on 04/27/2014 and I reviewed these with him. 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 Ankle arm index is 0.63 on the right and 0.64 on the left  Impression and plan: Symptoms of gushing sensation in his right groin most likely related to right inguinal hernia. Certainly no evidence of arterial cause for this. He does have chronic known SFA occlusive disease with no symptoms related to this. He was reassured this discussion will see [place] in an as-needed basis

## 2014-05-04 ENCOUNTER — Telehealth: Payer: Self-pay | Admitting: Cardiovascular Disease

## 2014-05-04 NOTE — Telephone Encounter (Signed)
New message    Office calling check on fax that was sent on 6/4 .

## 2014-05-04 NOTE — Telephone Encounter (Signed)
Returned call to Manuela Schwartz at [employer].She wanted to know if we received form stating ok for patient to hold plavix 7 days prior to procedure.Dr.Cooper's nurse out of office today will send message to her.

## 2014-05-05 NOTE — Telephone Encounter (Signed)
I spoke with [name] [name] and made her aware that the pt cannot hold plavix until after 05/25/14.  At that time the pt is okay to hold Plavix 5 days prior to epidural steroid injection. I have faxed records to [phone] per Susan's request.

## 2014-05-05 NOTE — Telephone Encounter (Signed)
Follow up     Need to know if pt can hold plavix prior to surgery.  Please fax clearance to [phone].  The PA who is taking care of this pt will be out of the office after today for a week and they want to take care of this before he leaves.

## 2014-05-05 NOTE — Telephone Encounter (Signed)
This is okay after 05/25/2014.

## 2014-05-26 ENCOUNTER — Other Ambulatory Visit (HOSPITAL_COMMUNITY): Payer: Self-pay | Admitting: Physician Assistant

## 2014-06-04 ENCOUNTER — Emergency Department (HOSPITAL_COMMUNITY): Payer: Medicaid Other

## 2014-06-04 ENCOUNTER — Encounter (HOSPITAL_COMMUNITY): Payer: Self-pay | Admitting: Emergency Medicine

## 2014-06-04 ENCOUNTER — Emergency Department (HOSPITAL_COMMUNITY)
Admission: EM | Admit: 2014-06-04 | Discharge: 2014-06-05 | Disposition: A | Discharge status: Home / Self Care | Payer: Medicaid Other | Attending: Emergency Medicine | Admitting: Emergency Medicine

## 2014-06-04 DIAGNOSIS — Z7902 Long term (current) use of antithrombotics/antiplatelets: Secondary | ICD-10-CM | POA: Diagnosis not present

## 2014-06-04 DIAGNOSIS — Z951 Presence of aortocoronary bypass graft: Secondary | ICD-10-CM | POA: Insufficient documentation

## 2014-06-04 DIAGNOSIS — F17200 Nicotine dependence, unspecified, uncomplicated: Secondary | ICD-10-CM | POA: Diagnosis not present

## 2014-06-04 DIAGNOSIS — I2510 Atherosclerotic heart disease of native coronary artery without angina pectoris: Secondary | ICD-10-CM | POA: Insufficient documentation

## 2014-06-04 DIAGNOSIS — K5900 Constipation, unspecified: Secondary | ICD-10-CM | POA: Diagnosis not present

## 2014-06-04 DIAGNOSIS — Z9861 Coronary angioplasty status: Secondary | ICD-10-CM | POA: Insufficient documentation

## 2014-06-04 DIAGNOSIS — I10 Essential (primary) hypertension: Secondary | ICD-10-CM | POA: Diagnosis not present

## 2014-06-04 DIAGNOSIS — G608 Other hereditary and idiopathic neuropathies: Secondary | ICD-10-CM | POA: Diagnosis not present

## 2014-06-04 DIAGNOSIS — K5909 Other constipation: Secondary | ICD-10-CM

## 2014-06-04 DIAGNOSIS — R42 Dizziness and giddiness: Secondary | ICD-10-CM | POA: Diagnosis not present

## 2014-06-04 DIAGNOSIS — E119 Type 2 diabetes mellitus without complications: Secondary | ICD-10-CM | POA: Diagnosis not present

## 2014-06-04 DIAGNOSIS — R079 Chest pain, unspecified: Secondary | ICD-10-CM | POA: Diagnosis not present

## 2014-06-04 DIAGNOSIS — I2589 Other forms of chronic ischemic heart disease: Secondary | ICD-10-CM | POA: Insufficient documentation

## 2014-06-04 DIAGNOSIS — E237 Disorder of pituitary gland, unspecified: Secondary | ICD-10-CM | POA: Insufficient documentation

## 2014-06-04 DIAGNOSIS — Z79899 Other long term (current) drug therapy: Secondary | ICD-10-CM | POA: Insufficient documentation

## 2014-06-04 DIAGNOSIS — Z87442 Personal history of urinary calculi: Secondary | ICD-10-CM | POA: Diagnosis not present

## 2014-06-04 DIAGNOSIS — R109 Unspecified abdominal pain: Secondary | ICD-10-CM | POA: Insufficient documentation

## 2014-06-04 LAB — COMPREHENSIVE METABOLIC PANEL
ALT: 22 U/L (ref 0–53)
AST: 20 U/L (ref 0–37)
Albumin: 3.7 g/dL (ref 3.5–5.2)
Alkaline Phosphatase: 90 U/L (ref 39–117)
Anion gap: 11 (ref 5–15)
BUN: 8 mg/dL (ref 6–23)
CALCIUM: 9.7 mg/dL (ref 8.4–10.5)
CHLORIDE: 100 meq/L (ref 96–112)
CO2: 27 meq/L (ref 19–32)
CREATININE: 0.64 mg/dL (ref 0.50–1.35)
GFR calc Af Amer: >90 mL/min (ref >90)
GFR calc non Af Amer: >90 mL/min (ref >90)
Glucose, Bld: 156 mg/dL — ABNORMAL HIGH (ref 70–99)
Potassium: 4.2 mEq/L (ref 3.7–5.3)
Sodium: 138 mEq/L (ref 137–147)
Total Bilirubin: <0.2 mg/dL — ABNORMAL LOW (ref 0.3–1.2)
Total Protein: 7.2 g/dL (ref 6.0–8.3)

## 2014-06-04 LAB — CBC WITH DIFFERENTIAL/PLATELET
BASOS ABS: 0.1 10*3/uL (ref 0.0–0.1)
BASOS PCT: 1 % (ref 0–1)
EOS PCT: 6 % — AB (ref 0–5)
Eosinophils Absolute: 0.3 10*3/uL (ref 0.0–0.7)
HEMATOCRIT: 39.4 % (ref 39.0–52.0)
HEMOGLOBIN: 13.6 g/dL (ref 13.0–17.0)
LYMPHS PCT: 36 % (ref 12–46)
Lymphs Abs: 2 10*3/uL (ref 0.7–4.0)
MCH: 31.6 pg (ref 26.0–34.0)
MCHC: 34.5 g/dL (ref 30.0–36.0)
MCV: 91.4 fL (ref 78.0–100.0)
MONO ABS: 0.5 10*3/uL (ref 0.1–1.0)
MONOS PCT: 9 % (ref 3–12)
NEUTROS ABS: 2.6 10*3/uL (ref 1.7–7.7)
Neutrophils Relative %: 48 % (ref 43–77)
Platelets: 126 10*3/uL — ABNORMAL LOW (ref 150–400)
RBC: 4.31 MIL/uL (ref 4.22–5.81)
RDW: 13.9 % (ref 11.5–15.5)
WBC: 5.5 10*3/uL (ref 4.0–10.5)

## 2014-06-04 LAB — URINALYSIS, ROUTINE W REFLEX MICROSCOPIC
BILIRUBIN URINE: NEGATIVE
GLUCOSE, UA: NEGATIVE mg/dL
Hgb urine dipstick: NEGATIVE
Ketones, ur: NEGATIVE mg/dL
Leukocytes, UA: NEGATIVE
Nitrite: NEGATIVE
PROTEIN: NEGATIVE mg/dL
Specific Gravity, Urine: 1.005 (ref 1.005–1.030)
UROBILINOGEN UA: 0.2 mg/dL (ref 0.0–1.0)
pH: 7.5 (ref 5.0–8.0)

## 2014-06-04 LAB — CBG MONITORING, ED: Glucose-Capillary: 161 mg/dL — ABNORMAL HIGH (ref 70–99)

## 2014-06-04 LAB — I-STAT TROPONIN, ED: TROPONIN I, POC: 0 ng/mL (ref 0.00–0.08)

## 2014-06-04 LAB — LIPASE, BLOOD: LIPASE: 22 U/L (ref 11–59)

## 2014-06-04 MED ORDER — SODIUM CHLORIDE 0.9 % IV BOLUS (SEPSIS)
1000.0000 mL | Freq: Once | INTRAVENOUS | Status: AC
  Administered 2014-06-04: 1000 mL via INTRAVENOUS

## 2014-06-04 MED ORDER — ONDANSETRON HCL 4 MG/2ML IJ SOLN
4.0000 mg | Freq: Once | INTRAMUSCULAR | Status: AC
  Administered 2014-06-04: 4 mg via INTRAVENOUS
  Filled 2014-06-04: qty 2

## 2014-06-04 NOTE — ED Provider Notes (Signed)
CSN: [id number]     Arrival date & time 06/04/14  2121 History   First MD Initiated Contact with Patient 06/04/14 2151     Chief Complaint  Patient presents with  . Abdominal Pain     (Consider location/radiation/quality/duration/timing/severity/associated sxs/prior Treatment) The history is provided by the patient.  YUYA VANWINGERDEN is a 63 y.o. male hx of HTN, HL, PVD, CAD with stent here with dizziness, chest pain. Chronically constipated. Had a large bowel movement today and then felt light headed and dizzy. Then had some chest pain and worsening chronic neck pain. Had intermittent chest pressure last several days. He took some nitro that gave him headache.    Past Medical History  Diagnosis Date  . Hyperlipidemia   . Hypertension   . Leg pain   . Peripheral vascular disease     s/p prior Ao-Bifem BPG  . 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 Coronary artery disease     a. NSTEMI 7/14=> LHC 06/07/13: Proximal LAD 30%, ostial D1 30%, AV circumflex 80%, then occluded, ostial OM2 occluded, left to left collaterals, mid RCA 95%, inferior HK, EF 60%. PCI: Promus DES to the mid RCA.  No intervention was recommended for the CTO circumflex.  . Ischemic cardiomyopathy     a. Echocardiogram 06/06/13: EF 35-40%, inferior AK, mild MR.  Marland Kitchen Shortness of breath     "can come on at anytime lately; before that it was just w/exertion" (11/10/2013)  . Type II diabetes mellitus   . Grand mal seizure     "last one was ~ 3 yr ago; controlled w/daily RX" (11/10/2013)  . Arthritis     "neck, back, legs" (11/10/2013)  . Kidney stones     "when I was younger; passed w/o treatment" (11/10/2013)   Past Surgical History  Procedure Laterality Date  . Iliac artery stent  05/06/2011  . Tonsillectomy    . Aorta - bilateral femoral artery bypass graft  11/25/2011    Procedure: AORTA BIFEMORAL BYPASS GRAFT;  Surgeon: Rosetta Posner, MD;  Location: [place];  Service:  Vascular;  Laterality: N/A;  . Pr vein bypass graft,aorto-fem-pop  11/24/2012  . Inguinal hernia repair Right    Family History  Problem Relation Age of Onset  . Hypertension Mother   . Heart attack Mother   . Diabetes Mother   . Aneurysm Father   . Heart attack Sister   . Heart disease Brother     heart transplant   History  Substance Use Topics  . Smoking status: Current Every Day Smoker -- 1.00 packs/day for 46 years    Types: Cigarettes  . Smokeless tobacco: Never Used  . Alcohol Use: Yes     Comment: a12/17/20214 "last drink was 3-4 yr ago; never had problem w/it"    Review of Systems  Cardiovascular: Positive for chest pain.  Neurological: Positive for dizziness.  All other systems reviewed and are negative.     Allergies  Atorvastatin; Penicillins; Pravastatin sodium; and Zetia  Home Medications   Prior to Admission medications   Medication Sig Start Date End Date Taking? Authorizing Provider  albuterol (PROVENTIL HFA;VENTOLIN HFA) 108 (90 BASE) MCG/ACT inhaler Inhale 2 puffs into the lungs every 6 (six) hours as needed for wheezing or shortness of breath. 11/11/13  Yes Ripudeep Krystal Eaton, MD  clopidogrel (PLAVIX) 75 MG tablet Take 75 mg by mouth daily with breakfast.   Yes Historical  Provider, MD  esomeprazole (NEXIUM) 40 MG capsule Take 40 mg by mouth daily at 12 noon.   Yes Historical Provider, MD  glipiZIDE (GLUCOTROL) 10 MG tablet Take 10 mg by mouth 2 (two) times daily before a meal.   Yes Historical Provider, MD  mirtazapine (REMERON) 15 MG tablet Take 15 mg by mouth daily as needed (sleep).    Yes Historical Provider, MD  nitroGLYCERIN (NITROSTAT) 0.4 MG SL tablet Place 0.4 mg under the tongue every 5 (five) minutes as needed for chest pain.   Yes Historical Provider, MD  Oxycodone HCl 10 MG TABS Take 10 mg by mouth every 4 (four) hours as needed (for pain).    Yes Historical Provider, MD  phenytoin (DILANTIN) 100 MG ER capsule Take 200-300 mg by mouth 2 (two)  times daily. Take 200 mg in the morning & 300 mg in the evening.   Yes Historical Provider, MD  polyethylene glycol (MIRALAX / GLYCOLAX) packet Take 17 g by mouth daily as needed for mild constipation.    Yes Historical Provider, MD  triamcinolone cream (KENALOG) 0.1 % Apply 1 application topically 2 (two) times daily as needed (for eczema).   Yes Historical Provider, MD  zolpidem (AMBIEN) 10 MG tablet Take 10 mg by mouth at bedtime as needed for sleep.   Yes Historical Provider, MD   BP 113/81  Pulse 51  Temp(Src) 98.2 F (36.8 C) (Oral)  Resp 16  Ht 6\' 2"  (1.88 m)  Wt 145 lb (65.772 kg)  BMI 18.61 kg/m2  SpO2 97% Physical Exam  Nursing note and vitals reviewed. Constitutional: He is oriented to person, place, and time. He appears well-developed and well-nourished.  Anxious   HENT:  Head: Normocephalic.  Mouth/Throat: Oropharynx is clear and moist.  Eyes: Conjunctivae are normal. Pupils are equal, round, and reactive to light.  Neck: Normal range of motion. Neck supple.  Cardiovascular: Normal rate, regular rhythm and normal heart sounds.   Pulmonary/Chest: Effort normal and breath sounds normal. No respiratory distress. He has no wheezes. He has no rales. He exhibits no tenderness.  Abdominal: Soft. Bowel sounds are normal. He exhibits no distension. There is no tenderness. There is no rebound.  Musculoskeletal: Normal range of motion. He exhibits no edema and no tenderness.  Neurological: He is alert and oriented to person, place, and time. No cranial nerve deficit. Coordination normal.  Skin: Skin is warm and dry.  Psychiatric: He has a normal mood and affect. His behavior is normal. Judgment and thought content normal.    ED Course  Procedures (including critical care time) Labs Review Labs Reviewed  CBC WITH DIFFERENTIAL - Abnormal; Notable for the following:    Platelets 126 (*)    Eosinophils Relative 6 (*)    All other components within normal limits  COMPREHENSIVE  METABOLIC PANEL - Abnormal; Notable for the following:    Glucose, Bld 156 (*)    Total Bilirubin <0.2 (*)    All other components within normal limits  CBG MONITORING, ED - Abnormal; Notable for the following:    Glucose-Capillary 161 (*)    All other components within normal limits  LIPASE, BLOOD  URINALYSIS, ROUTINE W REFLEX MICROSCOPIC  I-STAT TROPOININ, ED    Imaging Review Dg Abd Acute W/chest  06/04/2014   CLINICAL DATA:  Chest pain and abdominal pain  EXAM: ACUTE ABDOMEN SERIES (ABDOMEN 2 VIEW & CHEST 1 VIEW)  COMPARISON:  02/12/2014  FINDINGS: There is no evidence of dilated bowel loops or  free intraperitoneal air. No radiopaque calculi or other significant radiographic abnormality is seen. Heart size and mediastinal contours are within normal limits. Both lungs are clear. Vascular stents are identified over the pelvis.  IMPRESSION: Negative abdominal radiographs.  No acute cardiopulmonary disease.   Electronically Signed   By: Conchita Paris M.D.   On: 06/04/2014 22:58     EKG Interpretation   Date/Time:  Saturday June 04 2014 21:24:26 EDT Ventricular Rate:  54 PR Interval:  168 QRS Duration: 106 QT Interval:  454 QTC Calculation: 430 R Axis:   41 Text Interpretation:  Sinus rhythm Consider left atrial enlargement No  significant change since last tracing Confirmed by YAO  MD, DAVID ([id number])  on 06/04/2014 10:10:51 PM      MDM   Final diagnoses:  Chest pain, unspecified chest pain type  Dizziness  Other constipation   SEKOU ZUCKERMAN is a 63 y.o. male here with chest pain, near syncope after large bowel movement. Likely vasovagal. But given hx of CAD will get delta trop. Will check orthostatics and hydrate.   12:03 AM Trop neg x 1. Labs stable. Pending second troponin at 2 am.    Wandra Arthurs, MD 06/05/14 [phone]

## 2014-06-04 NOTE — Discharge Instructions (Signed)
Stay hydrated.   Take your meds as prescribed.   Follow up with your cardiologist.   Return to ER if you have chest pain, abdominal pain, vomiting, passing out.

## 2014-06-04 NOTE — ED Notes (Signed)
EMS reports, pt was picked up from a [employer] parking lot.  Pt reports he had a big BM today, has been constipated.  Reports feeling dizzy after having a BM, laid down and dizziness resolved.  Pt reports he then became anxious and started to have chest pain, took 2 nitro SL and pain went away, but started to have h/a from the nitro.  Pt now reports dull h/a but pain in his chest is resolved.

## 2014-06-04 NOTE — ED Notes (Signed)
Bed: HE03 Expected date:  Expected time:  Means of arrival:  Comments: EMS cp

## 2014-06-05 LAB — I-STAT TROPONIN, ED
Troponin i, poc: 0 ng/mL (ref 0.00–0.08)
Troponin i, poc: 0.01 ng/mL (ref 0.00–0.08)

## 2014-09-11 ENCOUNTER — Encounter (HOSPITAL_COMMUNITY): Payer: Self-pay | Admitting: Emergency Medicine

## 2014-09-11 ENCOUNTER — Emergency Department (HOSPITAL_COMMUNITY)
Admission: EM | Admit: 2014-09-11 | Discharge: 2014-09-11 | Disposition: A | Discharge status: Home / Self Care | Payer: Medicaid Other | Attending: Emergency Medicine | Admitting: Emergency Medicine

## 2014-09-11 DIAGNOSIS — I252 Old myocardial infarction: Secondary | ICD-10-CM | POA: Diagnosis not present

## 2014-09-11 DIAGNOSIS — I10 Essential (primary) hypertension: Secondary | ICD-10-CM | POA: Insufficient documentation

## 2014-09-11 DIAGNOSIS — Z720 Tobacco use: Secondary | ICD-10-CM | POA: Insufficient documentation

## 2014-09-11 DIAGNOSIS — G40409 Other generalized epilepsy and epileptic syndromes, not intractable, without status epilepticus: Secondary | ICD-10-CM | POA: Diagnosis not present

## 2014-09-11 DIAGNOSIS — R42 Dizziness and giddiness: Secondary | ICD-10-CM | POA: Diagnosis not present

## 2014-09-11 DIAGNOSIS — R1084 Generalized abdominal pain: Secondary | ICD-10-CM | POA: Diagnosis present

## 2014-09-11 DIAGNOSIS — Z87442 Personal history of urinary calculi: Secondary | ICD-10-CM | POA: Insufficient documentation

## 2014-09-11 DIAGNOSIS — R1013 Epigastric pain: Secondary | ICD-10-CM | POA: Diagnosis not present

## 2014-09-11 DIAGNOSIS — Z880 Allergy status to penicillin: Secondary | ICD-10-CM | POA: Diagnosis not present

## 2014-09-11 DIAGNOSIS — Z7902 Long term (current) use of antithrombotics/antiplatelets: Secondary | ICD-10-CM | POA: Insufficient documentation

## 2014-09-11 DIAGNOSIS — I2510 Atherosclerotic heart disease of native coronary artery without angina pectoris: Secondary | ICD-10-CM | POA: Insufficient documentation

## 2014-09-11 DIAGNOSIS — Z8639 Personal history of other endocrine, nutritional and metabolic disease: Secondary | ICD-10-CM | POA: Insufficient documentation

## 2014-09-11 DIAGNOSIS — I739 Peripheral vascular disease, unspecified: Secondary | ICD-10-CM | POA: Insufficient documentation

## 2014-09-11 DIAGNOSIS — M159 Polyosteoarthritis, unspecified: Secondary | ICD-10-CM | POA: Diagnosis not present

## 2014-09-11 DIAGNOSIS — R531 Weakness: Secondary | ICD-10-CM | POA: Insufficient documentation

## 2014-09-11 DIAGNOSIS — E119 Type 2 diabetes mellitus without complications: Secondary | ICD-10-CM | POA: Diagnosis not present

## 2014-09-11 DIAGNOSIS — Z8619 Personal history of other infectious and parasitic diseases: Secondary | ICD-10-CM | POA: Diagnosis not present

## 2014-09-11 DIAGNOSIS — R51 Headache: Secondary | ICD-10-CM | POA: Insufficient documentation

## 2014-09-11 DIAGNOSIS — Z79899 Other long term (current) drug therapy: Secondary | ICD-10-CM | POA: Diagnosis not present

## 2014-09-11 LAB — COMPREHENSIVE METABOLIC PANEL
ALK PHOS: 93 U/L (ref 39–117)
ALT: 17 U/L (ref 0–53)
AST: 20 U/L (ref 0–37)
Albumin: 4.3 g/dL (ref 3.5–5.2)
Anion gap: 14 (ref 5–15)
BILIRUBIN TOTAL: 0.3 mg/dL (ref 0.3–1.2)
BUN: 11 mg/dL (ref 6–23)
CHLORIDE: 93 meq/L — AB (ref 96–112)
CO2: 24 meq/L (ref 19–32)
Calcium: 9.5 mg/dL (ref 8.4–10.5)
Creatinine, Ser: 0.61 mg/dL (ref 0.50–1.35)
GFR calc non Af Amer: >90 mL/min (ref >90)
GLUCOSE: 173 mg/dL — AB (ref 70–99)
POTASSIUM: 4.4 meq/L (ref 3.7–5.3)
Sodium: 131 mEq/L — ABNORMAL LOW (ref 137–147)
Total Protein: 7.8 g/dL (ref 6.0–8.3)

## 2014-09-11 LAB — URINALYSIS, ROUTINE W REFLEX MICROSCOPIC
BILIRUBIN URINE: NEGATIVE
GLUCOSE, UA: NEGATIVE mg/dL
Hgb urine dipstick: NEGATIVE
KETONES UR: NEGATIVE mg/dL
LEUKOCYTES UA: NEGATIVE
Nitrite: NEGATIVE
PH: 6.5 (ref 5.0–8.0)
Protein, ur: NEGATIVE mg/dL
SPECIFIC GRAVITY, URINE: 1.008 (ref 1.005–1.030)
Urobilinogen, UA: 0.2 mg/dL (ref 0.0–1.0)

## 2014-09-11 LAB — I-STAT TROPONIN, ED: Troponin i, poc: 0 ng/mL (ref 0.00–0.08)

## 2014-09-11 LAB — CBC WITH DIFFERENTIAL/PLATELET
Basophils Absolute: 0 10*3/uL (ref 0.0–0.1)
Basophils Relative: 1 % (ref 0–1)
Eosinophils Absolute: 0.1 10*3/uL (ref 0.0–0.7)
Eosinophils Relative: 2 % (ref 0–5)
HCT: 47.1 % (ref 39.0–52.0)
HEMOGLOBIN: 16 g/dL (ref 13.0–17.0)
LYMPHS PCT: 45 % (ref 12–46)
Lymphs Abs: 2 10*3/uL (ref 0.7–4.0)
MCH: 31.5 pg (ref 26.0–34.0)
MCHC: 34 g/dL (ref 30.0–36.0)
MCV: 92.7 fL (ref 78.0–100.0)
MONOS PCT: 10 % (ref 3–12)
Monocytes Absolute: 0.4 10*3/uL (ref 0.1–1.0)
NEUTROS ABS: 1.9 10*3/uL (ref 1.7–7.7)
NEUTROS PCT: 42 % — AB (ref 43–77)
Platelets: 123 10*3/uL — ABNORMAL LOW (ref 150–400)
RBC: 5.08 MIL/uL (ref 4.22–5.81)
RDW: 13.3 % (ref 11.5–15.5)
WBC: 4.4 10*3/uL (ref 4.0–10.5)

## 2014-09-11 LAB — LIPASE, BLOOD: Lipase: 23 U/L (ref 11–59)

## 2014-09-11 MED ORDER — SODIUM CHLORIDE 0.9 % IV BOLUS (SEPSIS): 1000.0000 mL | Freq: Once | INTRAVENOUS | Status: DC

## 2014-09-11 MED ORDER — SUCRALFATE 1 G PO TABS: 1.0000 g | ORAL_TABLET | Freq: Three times a day (TID) | ORAL | Status: DC

## 2014-09-11 MED ORDER — GI COCKTAIL ~~LOC~~
30.0000 mL | Freq: Once | ORAL | Status: AC
  Administered 2014-09-11: 30 mL via ORAL
  Filled 2014-09-11: qty 30

## 2014-09-11 MED ORDER — PROMETHAZINE HCL 25 MG PO TABS: 25.0000 mg | ORAL_TABLET | Freq: Three times a day (TID) | ORAL | Status: DC | PRN

## 2014-09-11 MED ORDER — SODIUM CHLORIDE 0.9 % IV BOLUS (SEPSIS)
1000.0000 mL | Freq: Once | INTRAVENOUS | Status: AC
  Administered 2014-09-11: 1000 mL via INTRAVENOUS

## 2014-09-11 MED ORDER — MORPHINE SULFATE 4 MG/ML IJ SOLN
6.0000 mg | Freq: Once | INTRAMUSCULAR | Status: AC
  Administered 2014-09-11: 6 mg via INTRAVENOUS
  Filled 2014-09-11: qty 2

## 2014-09-11 MED ORDER — ONDANSETRON HCL 4 MG/2ML IJ SOLN
4.0000 mg | Freq: Once | INTRAMUSCULAR | Status: AC
  Administered 2014-09-11: 4 mg via INTRAVENOUS
  Filled 2014-09-11: qty 2

## 2014-09-11 MED ORDER — HYDROCODONE-ACETAMINOPHEN 5-325 MG PO TABS: 1.0000 | ORAL_TABLET | Freq: Four times a day (QID) | ORAL | Status: DC | PRN

## 2014-09-11 NOTE — ED Provider Notes (Signed)
CSN: [id number]     Arrival date & time 09/11/14  1237 History   First MD Initiated Contact with Patient 09/11/14 1306     Chief Complaint  Patient presents with  . Abdominal Pain  . Dizziness  . Headache     (Consider location/radiation/quality/duration/timing/severity/associated sxs/prior Treatment) HPI Patient presents to the emergency department with dizziness and generalized abdominal discomfort.  The patient, states, that he recently was treated for H. pylori.  Patient, states, that he's also had some mild nausea, but no vomiting.  He also, states, that he has decreased appetite.  Patient, states, that he's worried that eating because he doesn't want his blood sugar to go up and cause any further abdominal discomfort.  Patient denies chest pain, shortness of breath, fever, dysuria, back pain, weakness, lightheadedness, rash, headache, blurred vision, near syncope, or syncope.  Patient, states he took his home medications as prescribed, but no other medications.  Patient, states nothing seems make his condition, better or worse Past Medical History  Diagnosis Date  . Hyperlipidemia   . Hypertension   . Leg pain   . Peripheral vascular disease     s/p prior Ao-Bifem BPG  . 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 Coronary artery disease     a. NSTEMI 7/14=> LHC 06/07/13: Proximal LAD 30%, ostial D1 30%, AV circumflex 80%, then occluded, ostial OM2 occluded, left to left collaterals, mid RCA 95%, inferior HK, EF 60%. PCI: Promus DES to the mid RCA.  No intervention was recommended for the CTO circumflex.  . Ischemic cardiomyopathy     a. Echocardiogram 06/06/13: EF 35-40%, inferior AK, mild MR.  Marland Kitchen Shortness of breath     "can come on at anytime lately; before that it was just w/exertion" (11/10/2013)  . Type II diabetes mellitus   . Grand mal seizure     "last one was ~ 3 yr ago; controlled w/daily RX" (11/10/2013)  . Arthritis    "neck, back, legs" (11/10/2013)  . Kidney stones     "when I was younger; passed w/o treatment" (11/10/2013)   Past Surgical History  Procedure Laterality Date  . Iliac artery stent  05/06/2011  . Tonsillectomy    . Aorta - bilateral femoral artery bypass graft  11/25/2011    Procedure: AORTA BIFEMORAL BYPASS GRAFT;  Surgeon: Rosetta Posner, MD;  Location: [place];  Service: Vascular;  Laterality: N/A;  . Pr vein bypass graft,aorto-fem-pop  11/24/2012  . Inguinal hernia repair Right    Family History  Problem Relation Age of Onset  . Hypertension Mother   . Heart attack Mother   . Diabetes Mother   . Aneurysm Father   . Heart attack Sister   . Heart disease Brother     heart transplant   History  Substance Use Topics  . Smoking status: Current Every Day Smoker -- 1.00 packs/day for 46 years    Types: Cigarettes  . Smokeless tobacco: Never Used  . Alcohol Use: Yes     Comment: a12/17/20214 "last drink was 3-4 yr ago; never had problem w/it"    Review of Systems   All other systems negative except as documented in the HPI. All pertinent positives and negatives as reviewed in the HPI. Allergies  Zetia; Atorvastatin; Penicillins; and Pravastatin sodium  Home Medications   Prior to Admission medications   Medication Sig Start Date End Date Taking? Authorizing Provider  albuterol (PROVENTIL HFA;VENTOLIN  HFA) 108 (90 BASE) MCG/ACT inhaler Inhale 2 puffs into the lungs every 6 (six) hours as needed for wheezing or shortness of breath. 11/11/13  Yes Ripudeep Krystal Eaton, MD  Calcium-Magnesium-Vitamin D (CALCIUM MAGNESIUM PO) Take 4 tablets by mouth 3 (three) times daily.   Yes Historical Provider, MD  clopidogrel (PLAVIX) 75 MG tablet Take 75 mg by mouth daily with breakfast.   Yes Historical Provider, MD  esomeprazole (NEXIUM) 40 MG capsule Take 40 mg by mouth daily as needed (for acid reflex).    Yes Historical Provider, MD  glipiZIDE (GLUCOTROL) 10 MG tablet Take 10 mg by mouth 2 (two)  times daily before a meal.   Yes Historical Provider, MD  Influenza Vac Split Quad (FLUZONE) 0.25 ML injection Inject 0.25 mLs into the muscle once.   Yes Historical Provider, MD  ketoconazole (NIZORAL) 2 % cream Apply 1 application topically daily as needed for irritation.   Yes Historical Provider, MD  Multiple Vitamins-Minerals (MULTIVITAMIN WITH MINERALS) tablet Take 4 tablets by mouth 3 (three) times daily.   Yes Historical Provider, MD  nitroGLYCERIN (NITROSTAT) 0.4 MG SL tablet Place 0.4 mg under the tongue every 5 (five) minutes as needed for chest pain.   Yes Historical Provider, MD  oxycodone (ROXICODONE) 30 MG immediate release tablet Take 30 mg by mouth every 8 (eight) hours as needed for pain.   Yes Historical Provider, MD  phenytoin (DILANTIN) 100 MG ER capsule Take 200-300 mg by mouth 2 (two) times daily. Take 200 mg in the morning & 300 mg in the evening.   Yes Historical Provider, MD  polyethylene glycol (MIRALAX / GLYCOLAX) packet Take 17 g by mouth 2 (two) times daily.    Yes Historical Provider, MD  triamcinolone cream (KENALOG) 0.1 % Apply 1 application topically 2 (two) times daily as needed (for eczema).   Yes Historical Provider, MD  zolpidem (AMBIEN) 10 MG tablet Take 10 mg by mouth at bedtime as needed for sleep.   Yes Historical Provider, MD   BP 151/92  Pulse 62  Temp(Src) 97.5 F (36.4 C) (Oral)  Resp 20  SpO2 95% Physical Exam  Nursing note and vitals reviewed. Constitutional: He is oriented to person, place, and time. He appears well-developed and well-nourished. No distress.  HENT:  Head: Normocephalic and atraumatic.  Mouth/Throat: Oropharynx is clear and moist.  Eyes: Pupils are equal, round, and reactive to light.  Neck: Normal range of motion. Neck supple.  Cardiovascular: Normal rate, regular rhythm and normal heart sounds.  Exam reveals no gallop and no friction rub.   No murmur heard. Pulmonary/Chest: Effort normal and breath sounds normal. No  respiratory distress.  Abdominal: Soft. Bowel sounds are normal. He exhibits no distension and no mass. There is no tenderness. There is no rebound and no guarding.  Neurological: He is alert and oriented to person, place, and time. He exhibits normal muscle tone. Coordination normal.  Skin: Skin is warm and dry. No rash noted. No erythema.    ED Course  Procedures (including critical care time) Labs Review Labs Reviewed  CBC WITH DIFFERENTIAL - Abnormal; Notable for the following:    Platelets 123 (*)    Neutrophils Relative % 42 (*)    All other components within normal limits  COMPREHENSIVE METABOLIC PANEL - Abnormal; Notable for the following:    Sodium 131 (*)    Chloride 93 (*)    Glucose, Bld 173 (*)    All other components within normal limits  LIPASE,  BLOOD  URINALYSIS, ROUTINE W REFLEX MICROSCOPIC  I-STAT TROPOININ, ED   Patient, currently does not have any abdominal pain.  Patient has multiple complaints that seemed to be waxing and waning and of a chronic nature patient, in stable here in the emergency department  Brent General, PA-C 09/11/14 1518

## 2014-09-11 NOTE — ED Notes (Signed)
Pt from home via [facility] c/o generalized abdominal pain in which he states has been going on for a long time. He also c/o dizziness that started this morning. He denies blurred vision. HX of H. Pylori. Reports nausea and decreased appetite. He is alert and oriented.

## 2014-09-11 NOTE — Discharge Instructions (Signed)
Return here as needed. Follow up with your doctor. °

## 2014-09-11 NOTE — ED Notes (Addendum)
Pt aware of the need for a urine sample. Urinal provided.

## 2014-09-11 NOTE — ED Notes (Signed)
Pt was asked to changed into gown.

## 2014-09-12 NOTE — ED Provider Notes (Signed)
  This was a shared visit with a mid-level provided (NP or PA).  Throughout the patient's course I was available for consultation/collaboration.  I saw the ECG (if appropriate), relevant labs and studies - I agree with the interpretation.  On my exam the patient was in no distress.  He had a variety of complaints, in a waxing, waning pattern, with different answers each time I checked on him in regards to what was currently bothering him. Patient was in no distress throughout his emergency department course, and during my several evaluations. He was sitting upright, speaking clearly, with no visible increased work of breathing, nor pain or distress.  He was hungry requesting food. Patient's labs, bradycardic studies, EKG all unremarkable,  sinus rhythm, rate 57, artifact, otherwise unremarkable . Patient discharged in stable condition after several hours of monitoring, with no decompensation, no change in his condition, the aforementioned reassuring evaluation.      Carmin Muskrat, MD 09/12/14 [phone]

## 2014-09-16 ENCOUNTER — Emergency Department (HOSPITAL_COMMUNITY)
Admission: EM | Admit: 2014-09-16 | Discharge: 2014-09-16 | Disposition: A | Discharge status: Home / Self Care | Payer: Medicaid Other | Attending: Emergency Medicine | Admitting: Emergency Medicine

## 2014-09-16 ENCOUNTER — Emergency Department (HOSPITAL_COMMUNITY): Payer: Medicaid Other

## 2014-09-16 ENCOUNTER — Encounter (HOSPITAL_COMMUNITY): Payer: Self-pay | Admitting: Emergency Medicine

## 2014-09-16 DIAGNOSIS — Z79899 Other long term (current) drug therapy: Secondary | ICD-10-CM | POA: Insufficient documentation

## 2014-09-16 DIAGNOSIS — I10 Essential (primary) hypertension: Secondary | ICD-10-CM | POA: Insufficient documentation

## 2014-09-16 DIAGNOSIS — M1990 Unspecified osteoarthritis, unspecified site: Secondary | ICD-10-CM | POA: Insufficient documentation

## 2014-09-16 DIAGNOSIS — I2510 Atherosclerotic heart disease of native coronary artery without angina pectoris: Secondary | ICD-10-CM | POA: Diagnosis not present

## 2014-09-16 DIAGNOSIS — R079 Chest pain, unspecified: Secondary | ICD-10-CM | POA: Diagnosis present

## 2014-09-16 DIAGNOSIS — R0789 Other chest pain: Secondary | ICD-10-CM | POA: Diagnosis not present

## 2014-09-16 DIAGNOSIS — E119 Type 2 diabetes mellitus without complications: Secondary | ICD-10-CM | POA: Diagnosis not present

## 2014-09-16 DIAGNOSIS — G40409 Other generalized epilepsy and epileptic syndromes, not intractable, without status epilepticus: Secondary | ICD-10-CM | POA: Diagnosis not present

## 2014-09-16 DIAGNOSIS — Z880 Allergy status to penicillin: Secondary | ICD-10-CM | POA: Diagnosis not present

## 2014-09-16 DIAGNOSIS — Z87442 Personal history of urinary calculi: Secondary | ICD-10-CM | POA: Diagnosis not present

## 2014-09-16 DIAGNOSIS — R1013 Epigastric pain: Secondary | ICD-10-CM | POA: Diagnosis not present

## 2014-09-16 DIAGNOSIS — Z7902 Long term (current) use of antithrombotics/antiplatelets: Secondary | ICD-10-CM | POA: Insufficient documentation

## 2014-09-16 DIAGNOSIS — R7889 Finding of other specified substances, not normally found in blood: Secondary | ICD-10-CM | POA: Insufficient documentation

## 2014-09-16 DIAGNOSIS — Z7952 Long term (current) use of systemic steroids: Secondary | ICD-10-CM | POA: Insufficient documentation

## 2014-09-16 DIAGNOSIS — Z720 Tobacco use: Secondary | ICD-10-CM | POA: Diagnosis not present

## 2014-09-16 LAB — BASIC METABOLIC PANEL
Anion gap: 15 (ref 5–15)
BUN: 8 mg/dL (ref 6–23)
CHLORIDE: 94 meq/L — AB (ref 96–112)
CO2: 23 meq/L (ref 19–32)
Calcium: 9.8 mg/dL (ref 8.4–10.5)
Creatinine, Ser: 0.55 mg/dL (ref 0.50–1.35)
GFR calc Af Amer: >90 mL/min (ref >90)
GLUCOSE: 201 mg/dL — AB (ref 70–99)
POTASSIUM: 4.2 meq/L (ref 3.7–5.3)
SODIUM: 132 meq/L — AB (ref 137–147)

## 2014-09-16 LAB — CBC
HEMATOCRIT: 43.9 % (ref 39.0–52.0)
HEMOGLOBIN: 15.4 g/dL (ref 13.0–17.0)
MCH: 31.4 pg (ref 26.0–34.0)
MCHC: 35.1 g/dL (ref 30.0–36.0)
MCV: 89.4 fL (ref 78.0–100.0)
PLATELETS: 125 10*3/uL — AB (ref 150–400)
RBC: 4.91 MIL/uL (ref 4.22–5.81)
RDW: 13 % (ref 11.5–15.5)
WBC: 3.8 10*3/uL — AB (ref 4.0–10.5)

## 2014-09-16 LAB — PHENYTOIN LEVEL, TOTAL: Phenytoin Lvl: 27.2 ug/mL — ABNORMAL HIGH (ref 10.0–20.0)

## 2014-09-16 LAB — I-STAT TROPONIN, ED: Troponin i, poc: 0 ng/mL (ref 0.00–0.08)

## 2014-09-16 LAB — LIPASE, BLOOD: LIPASE: 21 U/L (ref 11–59)

## 2014-09-16 MED ORDER — GI COCKTAIL ~~LOC~~
30.0000 mL | Freq: Once | ORAL | Status: AC
  Administered 2014-09-16: 30 mL via ORAL
  Filled 2014-09-16: qty 30

## 2014-09-16 MED ORDER — MORPHINE SULFATE 4 MG/ML IJ SOLN: 4.0000 mg | Freq: Once | INTRAMUSCULAR | Status: DC

## 2014-09-16 NOTE — ED Provider Notes (Signed)
CSN: [id number]     Arrival date & time 09/16/14  1516 History   First MD Initiated Contact with Patient 09/16/14 1608     Chief Complaint  Patient presents with  . Chest Pain     (Consider location/radiation/quality/duration/timing/severity/associated sxs/prior Treatment) HPI Comments: Patient is a 63 year old male with history of coronary artery disease status post stent placement one year ago. He presents today with multiple complaints including intermittent sharp pains in his chest and upper abdomen, weakness, intermittent dyspnea, and generalized malaise that has been one on for the past week. He states that something "just ain't right". He was seen at [facility] 5 days ago and had an extensive workup, however no cause was found. He is not feeling any better today and presents for evaluation of this.  Patient is a 63 y.o. male presenting with chest pain. The history is provided by the patient.  Chest Pain Pain location:  Substernal area Pain quality: sharp   Pain radiates to:  Does not radiate Pain radiates to the back: no   Pain severity:  Moderate Onset quality:  Sudden Duration:  1 week Timing:  Intermittent Progression:  Worsening Chronicity:  New Context: not breathing and no movement   Relieved by:  Nothing Worsened by:  Nothing tried Ineffective treatments:  None tried   Past Medical History  Diagnosis Date  . Hyperlipidemia   . Hypertension   . Leg pain   . Peripheral vascular disease     s/p prior Ao-Bifem BPG  . 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 Coronary artery disease     a. NSTEMI 7/14=> LHC 06/07/13: Proximal LAD 30%, ostial D1 30%, AV circumflex 80%, then occluded, ostial OM2 occluded, left to left collaterals, mid RCA 95%, inferior HK, EF 60%. PCI: Promus DES to the mid RCA.  No intervention was recommended for the CTO circumflex.  . Ischemic cardiomyopathy     a. Echocardiogram 06/06/13: EF 35-40%,  inferior AK, mild MR.  Marland Kitchen Shortness of breath     "can come on at anytime lately; before that it was just w/exertion" (11/10/2013)  . Type II diabetes mellitus   . Grand mal seizure     "last one was ~ 3 yr ago; controlled w/daily RX" (11/10/2013)  . Arthritis     "neck, back, legs" (11/10/2013)  . Kidney stones     "when I was younger; passed w/o treatment" (11/10/2013)   Past Surgical History  Procedure Laterality Date  . Iliac artery stent  05/06/2011  . Tonsillectomy    . Aorta - bilateral femoral artery bypass graft  11/25/2011    Procedure: AORTA BIFEMORAL BYPASS GRAFT;  Surgeon: Rosetta Posner, MD;  Location: [place];  Service: Vascular;  Laterality: N/A;  . Pr vein bypass graft,aorto-fem-pop  11/24/2012  . Inguinal hernia repair Right    Family History  Problem Relation Age of Onset  . Hypertension Mother   . Heart attack Mother   . Diabetes Mother   . Aneurysm Father   . Heart attack Sister   . Heart disease Brother     heart transplant   History  Substance Use Topics  . Smoking status: Current Every Day Smoker -- 1.00 packs/day for 46 years    Types: Cigarettes  . Smokeless tobacco: Never Used  . Alcohol Use: Yes     Comment: [id number]/17/[id number] "last drink was 3-4 yr ago; never had problem w/it"  Review of Systems  Cardiovascular: Positive for chest pain.  All other systems reviewed and are negative.     Allergies  Zetia; Atorvastatin; Penicillins; and Pravastatin sodium  Home Medications   Prior to Admission medications   Medication Sig Start Date End Date Taking? Authorizing Provider  albuterol (PROVENTIL HFA;VENTOLIN HFA) 108 (90 BASE) MCG/ACT inhaler Inhale 2 puffs into the lungs every 6 (six) hours as needed for wheezing or shortness of breath. 11/11/13   Ripudeep Krystal Eaton, MD  Calcium-Magnesium-Vitamin D (CALCIUM MAGNESIUM PO) Take 4 tablets by mouth 3 (three) times daily.    Historical Provider, MD  clopidogrel (PLAVIX) 75 MG tablet Take 75 mg by mouth daily  with breakfast.    Historical Provider, MD  esomeprazole (NEXIUM) 40 MG capsule Take 40 mg by mouth daily as needed (for acid reflex).     Historical Provider, MD  glipiZIDE (GLUCOTROL) 10 MG tablet Take 10 mg by mouth 2 (two) times daily before a meal.    Historical Provider, MD  HYDROcodone-acetaminophen (NORCO/VICODIN) 5-325 MG per tablet Take 1 tablet by mouth every 6 (six) hours as needed for moderate pain. 09/11/14   Resa Miner Lawyer, PA-C  Influenza Vac Split Quad (FLUZONE) 0.25 ML injection Inject 0.25 mLs into the muscle once.    Historical Provider, MD  ketoconazole (NIZORAL) 2 % cream Apply 1 application topically daily as needed for irritation.    Historical Provider, MD  Multiple Vitamins-Minerals (MULTIVITAMIN WITH MINERALS) tablet Take 4 tablets by mouth 3 (three) times daily.    Historical Provider, MD  nitroGLYCERIN (NITROSTAT) 0.4 MG SL tablet Place 0.4 mg under the tongue every 5 (five) minutes as needed for chest pain.    Historical Provider, MD  oxycodone (ROXICODONE) 30 MG immediate release tablet Take 30 mg by mouth every 8 (eight) hours as needed for pain.    Historical Provider, MD  phenytoin (DILANTIN) 100 MG ER capsule Take 200-300 mg by mouth 2 (two) times daily. Take 200 mg in the morning & 300 mg in the evening.    Historical Provider, MD  polyethylene glycol (MIRALAX / GLYCOLAX) packet Take 17 g by mouth 2 (two) times daily.     Historical Provider, MD  promethazine (PHENERGAN) 25 MG tablet Take 1 tablet (25 mg total) by mouth every 8 (eight) hours as needed for nausea or vomiting. 09/11/14   Resa Miner Lawyer, PA-C  sucralfate (CARAFATE) 1 G tablet Take 1 tablet (1 g total) by mouth 4 (four) times daily -  with meals and at bedtime. 09/11/14   Resa Miner Lawyer, PA-C  triamcinolone cream (KENALOG) 0.1 % Apply 1 application topically 2 (two) times daily as needed (for eczema).    Historical Provider, MD  zolpidem (AMBIEN) 10 MG tablet Take 10 mg by mouth at  bedtime as needed for sleep.    Historical Provider, MD   BP 126/87  Pulse 71  Temp(Src) 97.7 F (36.5 C) (Oral)  Resp 18  SpO2 98% Physical Exam  Nursing note and vitals reviewed. Constitutional: He is oriented to person, place, and time. He appears well-developed and well-nourished. No distress.  HENT:  Head: Normocephalic and atraumatic.  Mouth/Throat: Oropharynx is clear and moist.  Eyes: EOM are normal. Pupils are equal, round, and reactive to light.  Neck: Normal range of motion. Neck supple.  Cardiovascular: Normal rate, regular rhythm and normal heart sounds.   No murmur heard. Pulmonary/Chest: Effort normal and breath sounds normal. No respiratory distress. He has no wheezes.  Abdominal:  Soft. Bowel sounds are normal. He exhibits no distension. There is no tenderness.  Musculoskeletal: Normal range of motion. He exhibits no edema.  Lymphadenopathy:    He has no cervical adenopathy.  Neurological: He is alert and oriented to person, place, and time. No cranial nerve deficit. Coordination normal.  Skin: Skin is warm and dry. He is not diaphoretic.    ED Course  Procedures (including critical care time) Labs Review Labs Reviewed  BASIC METABOLIC PANEL - Abnormal; Notable for the following:    Sodium 132 (*)    Chloride 94 (*)    Glucose, Bld 201 (*)    All other components within normal limits  CBC - Abnormal; Notable for the following:    WBC 3.8 (*)    Platelets 125 (*)    All other components within normal limits  LIPASE, BLOOD  PHENYTOIN LEVEL, TOTAL  I-STAT TROPOININ, ED    Imaging Review Dg Chest 2 View  09/16/2014   CLINICAL DATA:  Chest pain and shortness of breath.  EXAM: CHEST - 2 VIEW  COMPARISON:  02/12/2014 and abdominal series on 06/04/2014.  FINDINGS: The heart size and mediastinal contours are within normal limits. Stable probable underlying COPD. There is no evidence of pulmonary edema, consolidation, pneumothorax, nodule or pleural fluid. The  visualized skeletal structures are unremarkable.  IMPRESSION: No active disease.   Electronically Signed   By: Aletta Edouard M.D.   On: 09/16/2014 16:00     EKG Interpretation   Date/Time:  Friday September 16 2014 15:22:24 EDT Ventricular Rate:  70 PR Interval:  160 QRS Duration: 96 QT Interval:  404 QTC Calculation: 436 R Axis:   95 Text Interpretation:  Normal sinus rhythm Rightward axis ST \\T \ T wave  abnormality, consider inferior ischemia Abnormal ECG No significant change  since 09/11/14 Confirmed by DELOS  MD, Aleeza Bellville ([id number]) on 09/16/2014  4:12:07 PM      MDM   Final diagnoses:  Chest pain    Patient is a 63 year old male who presents with multiple complaints that are seemingly unrelated. He is complaining of intermittent sharp pains in his chest and upper abdomen which do not sound cardiac in nature. These have been going on all week and there are no changes in his EKG and his troponin has remained flat. I highly doubt a cardiac etiology and do not feel as though further workup and this is indicated.  There are no other significant laboratory abnormalities with the exception of a supratherapeutic dilantin level. I will invite him to hold his Dilantin for a day then resume at a lower dose. He is to have this followed up with his primary Dr. in one to 2 weeks.    Veryl Speak, MD 09/16/14 [phone]

## 2014-09-16 NOTE — Discharge Instructions (Signed)
Hold your Dilantin for the next 24 hours, then resume taking it at 200 mg twice daily.  Followup with your primary Dr. for a recheck and repeat Dilantin level in the next 1-2 weeks. Return to the emergency department if your symptoms substantially worsen or change.   Chest Pain (Nonspecific) It is often hard to give a specific diagnosis for the cause of chest pain. There is always a chance that your pain could be related to something serious, such as a heart attack or a blood clot in the lungs. You need to follow up with your health care provider for further evaluation. CAUSES   Heartburn.  Pneumonia or bronchitis.  Anxiety or stress.  Inflammation around your heart (pericarditis) or lung (pleuritis or pleurisy).  A blood clot in the lung.  A collapsed lung (pneumothorax). It can develop suddenly on its own (spontaneous pneumothorax) or from trauma to the chest.  Shingles infection (herpes zoster virus). The chest wall is composed of bones, muscles, and cartilage. Any of these can be the source of the pain.  The bones can be bruised by injury.  The muscles or cartilage can be strained by coughing or overwork.  The cartilage can be affected by inflammation and become sore (costochondritis). DIAGNOSIS  Lab tests or other studies may be needed to find the cause of your pain. Your health care provider may have you take a test called an ambulatory electrocardiogram (ECG). An ECG records your heartbeat patterns over a 24-hour period. You may also have other tests, such as:  Transthoracic echocardiogram (TTE). During echocardiography, sound waves are used to evaluate how blood flows through your heart.  Transesophageal echocardiogram (TEE).  Cardiac monitoring. This allows your health care provider to monitor your heart rate and rhythm in real time.  Holter monitor. This is a portable device that records your heartbeat and can help diagnose heart arrhythmias. It allows your health care  provider to track your heart activity for several days, if needed.  Stress tests by exercise or by giving medicine that makes the heart beat faster. TREATMENT   Treatment depends on what may be causing your chest pain. Treatment may include:  Acid blockers for heartburn.  Anti-inflammatory medicine.  Pain medicine for inflammatory conditions.  Antibiotics if an infection is present.  You may be advised to change lifestyle habits. This includes stopping smoking and avoiding alcohol, caffeine, and chocolate.  You may be advised to keep your head raised (elevated) when sleeping. This reduces the chance of acid going backward from your stomach into your esophagus. Most of the time, nonspecific chest pain will improve within 2-3 days with rest and mild pain medicine.  HOME CARE INSTRUCTIONS   If antibiotics were prescribed, take them as directed. Finish them even if you start to feel better.  For the next few days, avoid physical activities that bring on chest pain. Continue physical activities as directed.  Do not use any tobacco products, including cigarettes, chewing tobacco, or electronic cigarettes.  Avoid drinking alcohol.  Only take medicine as directed by your health care provider.  Follow your health care provider's suggestions for further testing if your chest pain does not go away.  Keep any follow-up appointments you made. If you do not go to an appointment, you could develop lasting (chronic) problems with pain. If there is any problem keeping an appointment, call to reschedule. SEEK MEDICAL CARE IF:   Your chest pain does not go away, even after treatment.  You have a rash  with blisters on your chest.  You have a fever. SEEK IMMEDIATE MEDICAL CARE IF:   You have increased chest pain or pain that spreads to your arm, neck, jaw, back, or abdomen.  You have shortness of breath.  You have an increasing cough, or you cough up blood.  You have severe back or  abdominal pain.  You feel nauseous or vomit.  You have severe weakness.  You faint.  You have chills. This is an emergency. Do not wait to see if the pain will go away. Get medical help at once. Call your local emergency services (911 in [place] Do not drive yourself to the hospital. MAKE SURE YOU:   Understand these instructions.  Will watch your condition.  Will get help right away if you are not doing well or get worse. Document Released: 08/21/2005 Document Revised: 11/16/2013 Document Reviewed: 06/16/2008 [facility] Patient Information 2015 [place], [place]. This information is not intended to replace advice given to you by your health care provider. Make sure you discuss any questions you have with your health care provider.

## 2014-09-16 NOTE — ED Notes (Signed)
Presents with left sided chest pain, weakness, SOB, nausea, indigestion and "feels like something ain't right" pain is described as sharp. Walking makes pain worse. Oxycodone makes pain better. Pain is shooting, sharp and intermittent.

## 2014-10-11 ENCOUNTER — Telehealth: Payer: Self-pay | Admitting: Cardiovascular Disease

## 2014-10-14 ENCOUNTER — Ambulatory Visit (INDEPENDENT_AMBULATORY_CARE_PROVIDER_SITE_OTHER): Payer: Medicaid Other | Admitting: Cardiovascular Disease

## 2014-10-14 ENCOUNTER — Encounter: Payer: Self-pay | Admitting: Cardiovascular Disease

## 2014-10-14 VITALS — BP 115/68 | HR 64 | Ht 74.0 in | Wt 150.4 lb

## 2014-10-14 DIAGNOSIS — E785 Hyperlipidemia, unspecified: Secondary | ICD-10-CM

## 2014-10-14 DIAGNOSIS — I2510 Atherosclerotic heart disease of native coronary artery without angina pectoris: Secondary | ICD-10-CM

## 2014-10-14 NOTE — Patient Instructions (Signed)
Your physician recommends that you return for a FASTING LIPID and LIVER profile--nothing to eat or drink after midnight, lab opens at 7:30 AM  Your physician recommends that you continue on your current medications as directed. Please refer to the Current Medication list given to you today.  Your physician wants you to follow-up in: 1 YEAR with Dr Cooper. You will receive a reminder letter in the mail two months in advance. If you don't receive a letter, please call our office to schedule the follow-up appointment.  

## 2014-10-16 NOTE — Progress Notes (Signed)
Background: The patient is followed for CAD. He presented in 2014 with NSTEMI and was found to have severe stenosis of the RCA treated with PCI (Promus DES) and chronic occlusion of the LCx/OM1 branch treated medically. Other medical problems include PAD with history of aortobifemoral bypass, HTN, hyperlipidemia, diabetes, and seizure disorder. He is a former polysubstance user but has quit everything except cigarettes.  HPI:  63 year-old male presenting for follow-up evaluation. He has done reasonably well since I last saw him. Denies chest pain, shortness of breath, edema, or palpitations. He continues to smoke. Was seen in [facility] and give Rx for Crestor 5 mg but didn't fill the prescription.  Outpatient Encounter Prescriptions as of 10/14/2014  Medication Sig  . albuterol (PROVENTIL HFA;VENTOLIN HFA) 108 (90 BASE) MCG/ACT inhaler Inhale 2 puffs into the lungs every 6 (six) hours as needed for wheezing or shortness of breath.  . Calcium-Magnesium-Vitamin D (CALCIUM MAGNESIUM PO) Take 4 tablets by mouth 3 (three) times daily.  Marland Kitchen esomeprazole (NEXIUM) 40 MG capsule Take 40 mg by mouth daily as needed (for acid reflex).   Marland Kitchen glipiZIDE (GLUCOTROL) 10 MG tablet Take 10 mg by mouth 2 (two) times daily before a meal.  . HYDROcodone-acetaminophen (NORCO/VICODIN) 5-325 MG per tablet Take 1 tablet by mouth every 6 (six) hours as needed for moderate pain.  Marland Kitchen HYDROMET 5-1.5 MG/5ML syrup   . Influenza Vac Split Quad (FLUZONE) 0.25 ML injection Inject 0.25 mLs into the muscle once.  Marland Kitchen ketoconazole (NIZORAL) 2 % cream Apply 1 application topically daily as needed for irritation.  . Multiple Vitamins-Minerals (MULTIVITAMIN WITH MINERALS) tablet Take 4 tablets by mouth 3 (three) times daily.  . nitroGLYCERIN (NITROSTAT) 0.4 MG SL tablet Place 0.4 mg under the tongue every 5 (five) minutes as needed for chest pain.  Marland Kitchen oxycodone (ROXICODONE) 30 MG immediate release tablet Take 30 mg by mouth every 8 (eight)  hours as needed for pain.  Marland Kitchen PATADAY 0.2 % SOLN   . phenytoin (DILANTIN) 100 MG ER capsule Take 200-300 mg by mouth 2 (two) times daily. Take 200 mg in the morning & 300 mg in the evening.  . polyethylene glycol (MIRALAX / GLYCOLAX) packet Take 17 g by mouth 2 (two) times daily.   . promethazine (PHENERGAN) 25 MG tablet Take 1 tablet (25 mg total) by mouth every 8 (eight) hours as needed for nausea or vomiting.  . sucralfate (CARAFATE) 1 G tablet Take 1 tablet (1 g total) by mouth 4 (four) times daily -  with meals and at bedtime.  . triamcinolone cream (KENALOG) 0.1 % Apply 1 application topically 2 (two) times daily as needed (for eczema).  . zolpidem (AMBIEN) 10 MG tablet Take 10 mg by mouth at bedtime as needed for sleep.  . [DISCONTINUED] clopidogrel (PLAVIX) 75 MG tablet Take 75 mg by mouth daily with breakfast.    Allergies  Allergen Reactions  . Zetia [Ezetimibe] Anaphylaxis and Swelling    Tongue and throat   . Atorvastatin Other (See Comments)    Malaise & muscle weakness  . Penicillins Other (See Comments)    Unknown  . Pravastatin Sodium     Pravastatin 40 mg qday and 40 mg q M/W/F caused muscle aches    Past Medical History  Diagnosis Date  . Hyperlipidemia   . Hypertension   . Leg pain   . Peripheral vascular disease     s/p prior Ao-Bifem BPG  . Pituitary disorder     growth  on gland, evaluated every 6 months , at [facility]  . Coronary artery disease     a. NSTEMI 7/14=> LHC 06/07/13: Proximal LAD 30%, ostial D1 30%, AV circumflex 80%, then occluded, ostial OM2 occluded, left to left collaterals, mid RCA 95%, inferior HK, EF 60%. PCI: Promus DES to the mid RCA.  No intervention was recommended for the CTO circumflex.  . Ischemic cardiomyopathy     a. Echocardiogram 06/06/13: EF 35-40%, inferior AK, mild MR.  Marland Kitchen Shortness of breath     "can come on at anytime lately; before that it was just w/exertion" (11/10/2013)  . Type II diabetes mellitus   . Grand mal  seizure     "last one was ~ 3 yr ago; controlled w/daily RX" (11/10/2013)  . Arthritis     "neck, back, legs" (11/10/2013)  . Kidney stones     "when I was younger; passed w/o treatment" (11/10/2013)    family history includes Aneurysm in his father; Diabetes in his mother; Heart attack in his mother and sister; Heart disease in his brother; Hypertension in his mother.   ROS: Negative except as per HPI  BP 115/68 mmHg  Pulse 64  Ht 6\' 2"  (1.88 m)  Wt 150 lb 6.4 oz (68.221 kg)  BMI 19.30 kg/m2  PHYSICAL EXAM: Pt is alert and oriented, NAD HEENT: normal Neck: JVP - normal, carotids 2+= without bruits Lungs: CTA bilaterally CV: RRR without murmur or gallop Abd: soft, NT, Positive BS, no hepatomegaly Ext: no C/C/E, distal pulses intact and equal Skin: warm/dry no rash  ASSESSMENT AND PLAN: 1. CAD, native vessel, without angina. He has quit plavix on his own, now over 12 months out from PCI and he can remain on ASA alone. Otherwise he is not really interested in risk reduction measures. He does not think he can quit smoking. He is not interested in any further lipid-lowering therapies. I will see him back in one year.  2. Hyperlipidemia: will follow-up lipids, but I'm not going to refer him back to lipid clinic for statin or PCSK9 since he is clear that he won't take any of the medications.  3. Diabetes: followed by PCP. Complications include PAD/CAD.  4. PAD s/p aortobifemoral bypass. No claudication symptoms. Followed by Dr Donnetta Hutching.  Sherren Mocha, MD 10/16/2014 6:30 PM

## 2014-10-18 ENCOUNTER — Other Ambulatory Visit: Payer: Medicaid Other

## 2014-10-27 ENCOUNTER — Other Ambulatory Visit: Payer: Medicaid Other

## 2014-11-03 ENCOUNTER — Encounter (HOSPITAL_COMMUNITY): Payer: Self-pay | Admitting: Vascular Surgery

## 2015-01-17 ENCOUNTER — Emergency Department (HOSPITAL_COMMUNITY)
Admission: EM | Admit: 2015-01-17 | Discharge: 2015-01-17 | Disposition: A | Discharge status: Home / Self Care | Payer: Medicaid Other | Attending: Emergency Medicine | Admitting: Emergency Medicine

## 2015-01-17 ENCOUNTER — Emergency Department (HOSPITAL_COMMUNITY): Payer: Medicaid Other

## 2015-01-17 ENCOUNTER — Encounter (HOSPITAL_COMMUNITY): Payer: Self-pay | Admitting: Neurology

## 2015-01-17 DIAGNOSIS — E785 Hyperlipidemia, unspecified: Secondary | ICD-10-CM | POA: Insufficient documentation

## 2015-01-17 DIAGNOSIS — K589 Irritable bowel syndrome without diarrhea: Secondary | ICD-10-CM | POA: Diagnosis not present

## 2015-01-17 DIAGNOSIS — Z720 Tobacco use: Secondary | ICD-10-CM | POA: Insufficient documentation

## 2015-01-17 DIAGNOSIS — M1990 Unspecified osteoarthritis, unspecified site: Secondary | ICD-10-CM | POA: Insufficient documentation

## 2015-01-17 DIAGNOSIS — Z87442 Personal history of urinary calculi: Secondary | ICD-10-CM | POA: Insufficient documentation

## 2015-01-17 DIAGNOSIS — R0789 Other chest pain: Secondary | ICD-10-CM | POA: Diagnosis not present

## 2015-01-17 DIAGNOSIS — Z880 Allergy status to penicillin: Secondary | ICD-10-CM | POA: Insufficient documentation

## 2015-01-17 DIAGNOSIS — R079 Chest pain, unspecified: Secondary | ICD-10-CM | POA: Diagnosis present

## 2015-01-17 DIAGNOSIS — I739 Peripheral vascular disease, unspecified: Secondary | ICD-10-CM | POA: Diagnosis not present

## 2015-01-17 DIAGNOSIS — I2510 Atherosclerotic heart disease of native coronary artery without angina pectoris: Secondary | ICD-10-CM | POA: Diagnosis not present

## 2015-01-17 DIAGNOSIS — I10 Essential (primary) hypertension: Secondary | ICD-10-CM | POA: Diagnosis not present

## 2015-01-17 DIAGNOSIS — Z9889 Other specified postprocedural states: Secondary | ICD-10-CM | POA: Diagnosis not present

## 2015-01-17 DIAGNOSIS — E119 Type 2 diabetes mellitus without complications: Secondary | ICD-10-CM | POA: Diagnosis not present

## 2015-01-17 DIAGNOSIS — G40409 Other generalized epilepsy and epileptic syndromes, not intractable, without status epilepticus: Secondary | ICD-10-CM | POA: Insufficient documentation

## 2015-01-17 DIAGNOSIS — Z79899 Other long term (current) drug therapy: Secondary | ICD-10-CM | POA: Diagnosis not present

## 2015-01-17 LAB — I-STAT TROPONIN, ED
TROPONIN I, POC: 0 ng/mL (ref 0.00–0.08)
TROPONIN I, POC: 0 ng/mL (ref 0.00–0.08)

## 2015-01-17 LAB — BASIC METABOLIC PANEL
Anion gap: 8 (ref 5–15)
BUN: 13 mg/dL (ref 6–23)
CALCIUM: 9.7 mg/dL (ref 8.4–10.5)
CO2: 29 mmol/L (ref 19–32)
Chloride: 97 mmol/L (ref 96–112)
Creatinine, Ser: 0.78 mg/dL (ref 0.50–1.35)
GFR calc Af Amer: >90 mL/min (ref >90)
Glucose, Bld: 175 mg/dL — ABNORMAL HIGH (ref 70–99)
POTASSIUM: 4.7 mmol/L (ref 3.5–5.1)
Sodium: 134 mmol/L — ABNORMAL LOW (ref 135–145)

## 2015-01-17 LAB — CBC
HCT: 43.7 % (ref 39.0–52.0)
Hemoglobin: 14.9 g/dL (ref 13.0–17.0)
MCH: 31 pg (ref 26.0–34.0)
MCHC: 34.1 g/dL (ref 30.0–36.0)
MCV: 90.9 fL (ref 78.0–100.0)
PLATELETS: 139 10*3/uL — AB (ref 150–400)
RBC: 4.81 MIL/uL (ref 4.22–5.81)
RDW: 13.5 % (ref 11.5–15.5)
WBC: 4.9 10*3/uL (ref 4.0–10.5)

## 2015-01-17 LAB — HEPATIC FUNCTION PANEL
ALBUMIN: 4.3 g/dL (ref 3.5–5.2)
ALK PHOS: 78 U/L (ref 39–117)
ALT: 20 U/L (ref 0–53)
AST: 29 U/L (ref 0–37)
BILIRUBIN TOTAL: 0.8 mg/dL (ref 0.3–1.2)
Bilirubin, Direct: 0.3 mg/dL (ref 0.0–0.5)
Indirect Bilirubin: 0.5 mg/dL (ref 0.3–0.9)
TOTAL PROTEIN: 6.7 g/dL (ref 6.0–8.3)

## 2015-01-17 LAB — CBG MONITORING, ED: GLUCOSE-CAPILLARY: 89 mg/dL (ref 70–99)

## 2015-01-17 LAB — LIPASE, BLOOD: LIPASE: 29 U/L (ref 11–59)

## 2015-01-17 MED ORDER — GI COCKTAIL ~~LOC~~
30.0000 mL | Freq: Once | ORAL | Status: AC
  Administered 2015-01-17: 30 mL via ORAL
  Filled 2015-01-17: qty 30

## 2015-01-17 MED ORDER — OXYCODONE-ACETAMINOPHEN 5-325 MG PO TABS
1.0000 | ORAL_TABLET | Freq: Once | ORAL | Status: AC
  Administered 2015-01-17: 1 via ORAL
  Filled 2015-01-17: qty 1

## 2015-01-17 NOTE — Discharge Instructions (Signed)

## 2015-01-17 NOTE — ED Notes (Addendum)
Pt reports intermittent cp for 3 days, pain moves all over chest. Reports GI and cardiac issues. Denies n/v. Pt is a x 4. Has been taking hydrocodone for pain and herbs.

## 2015-01-17 NOTE — ED Notes (Signed)
Pt verified that he had all belongings before leaving room, double checked by RN.

## 2015-01-17 NOTE — ED Notes (Signed)
Pt remains monitored by blood pressure, pulse ox, and 5 lead.  

## 2015-01-17 NOTE — ED Provider Notes (Signed)
CSN: [id number]     Arrival date & time 01/17/15  1538 History   First MD Initiated Contact with Patient 01/17/15 1608     Chief Complaint  Patient presents with  . Chest Pain     (Consider location/radiation/quality/duration/timing/severity/associated sxs/prior Treatment) HPI  64 year old male with past medical history of hypertension, hyperlipidemia, coronary artery disease status post NSTEMI with stenting in July 2014, type 2 diabetes, and chronic atypical chest pain as well as IBS and chronic abdominal pain who presents with persistent abdominal pain as well as left sided chest pain. Regarding his chest pain, he describes a sharp, pinpoint pain over his left lateral chest that began while having a bowel movement. The pain began acutely, lasted several seconds, and resolved with sitting up and changing his position. He has had no CP since then. No cough, SOB, or sputum production. No hemoptysis. Regarding his abdominal pain, he describes a constant, aching, epigastric pain but also a dull "pressure" that does not radiate. It is similar to his usual abdominal pain. No vomiting. No diarrhea. No dysuria or hematuria.  Past Medical History  Diagnosis Date  . Hyperlipidemia   . Hypertension   . Leg pain   . Peripheral vascular disease     s/p prior Ao-Bifem BPG  . 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 Coronary artery disease     a. NSTEMI 7/14=> LHC 06/07/13: Proximal LAD 30%, ostial D1 30%, AV circumflex 80%, then occluded, ostial OM2 occluded, left to left collaterals, mid RCA 95%, inferior HK, EF 60%. PCI: Promus DES to the mid RCA.  No intervention was recommended for the CTO circumflex.  . Ischemic cardiomyopathy     a. Echocardiogram 06/06/13: EF 35-40%, inferior AK, mild MR.  Marland Kitchen Shortness of breath     "can come on at anytime lately; before that it was just w/exertion" (11/10/2013)  . Type II diabetes mellitus   . Grand mal seizure    "last one was ~ 3 yr ago; controlled w/daily RX" (11/10/2013)  . Arthritis     "neck, back, legs" (11/10/2013)  . Kidney stones     "when I was younger; passed w/o treatment" (11/10/2013)   Past Surgical History  Procedure Laterality Date  . Iliac artery stent  05/06/2011  . Tonsillectomy    . Aorta - bilateral femoral artery bypass graft  11/25/2011    Procedure: AORTA BIFEMORAL BYPASS GRAFT;  Surgeon: Rosetta Posner, MD;  Location: [place];  Service: Vascular;  Laterality: N/A;  . Pr vein bypass graft,aorto-fem-pop  11/24/2012  . Inguinal hernia repair Right   . Abdominal aortagram N/A 11/11/2011    Procedure: ABDOMINAL Maxcine Ham;  Surgeon: Angelia Mould, MD;  Location: [facility] CATH LAB;  Service: Cardiovascular;  Laterality: N/A;  . Lower extremity angiogram Bilateral 11/11/2011    Procedure: LOWER EXTREMITY ANGIOGRAM;  Surgeon: Angelia Mould, MD;  Location: [facility] CATH LAB;  Service: Cardiovascular;  Laterality: Bilateral;  . Left heart catheterization with coronary angiogram N/A 06/07/2013    Procedure: LEFT HEART CATHETERIZATION WITH CORONARY ANGIOGRAM;  Surgeon: Sherren Mocha, MD;  Location: [facility] CATH LAB;  Service: Cardiovascular;  Laterality: N/A;  . Percutaneous coronary stent intervention (pci-s) Right 06/07/2013    Procedure: PERCUTANEOUS CORONARY STENT INTERVENTION (PCI-S);  Surgeon: Sherren Mocha, MD;  Location: [facility] CATH LAB;  Service: Cardiovascular;  Laterality: Right;  . Left heart catheterization with coronary angiogram N/A 12/10/2013    Procedure:  LEFT HEART CATHETERIZATION WITH CORONARY ANGIOGRAM;  Surgeon: Blane Ohara, MD;  Location: [facility] CATH LAB;  Service: Cardiovascular;  Laterality: N/A;   Family History  Problem Relation Age of Onset  . Hypertension Mother   . Heart attack Mother   . Diabetes Mother   . Aneurysm Father   . Heart attack Sister   . Heart disease Brother     heart transplant   History  Substance Use Topics  . Smoking status: Current Every  Day Smoker -- 1.00 packs/day for 46 years    Types: Cigarettes  . Smokeless tobacco: Never Used  . Alcohol Use: Yes     Comment: a12/17/20214 "last drink was 3-4 yr ago; never had problem w/it"    Review of Systems  Constitutional: Negative for fever, chills and diaphoresis.  HENT: Negative for congestion, rhinorrhea and sore throat.   Eyes: Negative for visual disturbance.  Respiratory: Negative for cough, shortness of breath and wheezing.   Cardiovascular: Positive for chest pain. Negative for leg swelling.  Gastrointestinal: Positive for abdominal pain. Negative for nausea, vomiting and diarrhea.  Genitourinary: Negative for dysuria and flank pain.  Musculoskeletal: Negative for neck pain and neck stiffness.  Skin: Negative for rash.  Allergic/Immunologic: Negative for immunocompromised state.  Neurological: Negative for dizziness, syncope, weakness and headaches.      Allergies  Zetia; Atorvastatin; Penicillins; and Pravastatin sodium  Home Medications   Prior to Admission medications   Medication Sig Start Date End Date Taking? Authorizing Provider  albuterol (PROVENTIL HFA;VENTOLIN HFA) 108 (90 BASE) MCG/ACT inhaler Inhale 2 puffs into the lungs every 6 (six) hours as needed for wheezing or shortness of breath. 11/11/13   Ripudeep Krystal Eaton, MD  Calcium-Magnesium-Vitamin D (CALCIUM MAGNESIUM PO) Take 4 tablets by mouth 3 (three) times daily.    Historical Provider, MD  esomeprazole (NEXIUM) 40 MG capsule Take 40 mg by mouth daily as needed (for acid reflex).     Historical Provider, MD  glipiZIDE (GLUCOTROL) 10 MG tablet Take 10 mg by mouth 2 (two) times daily before a meal.    Historical Provider, MD  HYDROcodone-acetaminophen (NORCO/VICODIN) 5-325 MG per tablet Take 1 tablet by mouth every 6 (six) hours as needed for moderate pain. 09/11/14   Brent General, PA-C  HYDROMET 5-1.5 MG/5ML syrup  08/30/14   Historical Provider, MD  Influenza Vac Split Quad (FLUZONE) 0.25 ML  injection Inject 0.25 mLs into the muscle once.    Historical Provider, MD  ketoconazole (NIZORAL) 2 % cream Apply 1 application topically daily as needed for irritation.    Historical Provider, MD  Multiple Vitamins-Minerals (MULTIVITAMIN WITH MINERALS) tablet Take 4 tablets by mouth 3 (three) times daily.    Historical Provider, MD  nitroGLYCERIN (NITROSTAT) 0.4 MG SL tablet Place 0.4 mg under the tongue every 5 (five) minutes as needed for chest pain.    Historical Provider, MD  oxycodone (ROXICODONE) 30 MG immediate release tablet Take 30 mg by mouth every 8 (eight) hours as needed for pain.    Historical Provider, MD  PATADAY 0.2 % SOLN  08/11/14   Historical Provider, MD  phenytoin (DILANTIN) 100 MG ER capsule Take 200-300 mg by mouth 2 (two) times daily. Take 200 mg in the morning & 300 mg in the evening.    Historical Provider, MD  polyethylene glycol (MIRALAX / GLYCOLAX) packet Take 17 g by mouth 2 (two) times daily.     Historical Provider, MD  promethazine (PHENERGAN) 25 MG tablet Take 1  tablet (25 mg total) by mouth every 8 (eight) hours as needed for nausea or vomiting. 09/11/14   Resa Miner Lawyer, PA-C  sucralfate (CARAFATE) 1 G tablet Take 1 tablet (1 g total) by mouth 4 (four) times daily -  with meals and at bedtime. 09/11/14   Resa Miner Lawyer, PA-C  triamcinolone cream (KENALOG) 0.1 % Apply 1 application topically 2 (two) times daily as needed (for eczema).    Historical Provider, MD  zolpidem (AMBIEN) 10 MG tablet Take 10 mg by mouth at bedtime as needed for sleep.    Historical Provider, MD   BP 124/79 mmHg  Pulse 62  Temp(Src) 97.9 F (36.6 C) (Oral)  Resp 18  SpO2 100% Physical Exam  Constitutional: He is oriented to person, place, and time. He appears well-developed and well-nourished. No distress.  HENT:  Head: Normocephalic and atraumatic.  Mouth/Throat: No oropharyngeal exudate.  Eyes: Conjunctivae are normal. Pupils are equal, round, and reactive to light.    Neck: Neck supple.  Cardiovascular: Normal rate, normal heart sounds and intact distal pulses.  Exam reveals no friction rub.   No murmur heard. Pulmonary/Chest: Effort normal and breath sounds normal. No respiratory distress. He has no wheezes. He has no rales.    Abdominal: Soft. Bowel sounds are normal. He exhibits no distension. There is no tenderness.  Musculoskeletal: He exhibits no edema.  Neurological: He is alert and oriented to person, place, and time. He exhibits normal muscle tone.  Skin: Skin is warm. No rash noted.  Nursing note and vitals reviewed.   ED Course  Procedures (including critical care time) Labs Review Labs Reviewed  CBC - Abnormal; Notable for the following:    Platelets 139 (*)    All other components within normal limits  BASIC METABOLIC PANEL - Abnormal; Notable for the following:    Sodium 134 (*)    Glucose, Bld 175 (*)    All other components within normal limits  LIPASE, BLOOD  HEPATIC FUNCTION PANEL  I-STAT TROPOININ, ED    Imaging Review Dg Chest 2 View  01/17/2015   CLINICAL DATA:  Left side chest pain, dizziness  EXAM: CHEST  2 VIEW  COMPARISON:  09/16/2014  FINDINGS: Cardiomediastinal silhouette is stable. No acute infiltrate or pleural effusion. No pulmonary edema. Bony thorax is unremarkable.  IMPRESSION: No active cardiopulmonary disease.   Electronically Signed   By: Lahoma Crocker M.D.   On: 01/17/2015 16:28     EKG Interpretation   Date/Time:  Tuesday January 17 2015 15:42:46 EST Ventricular Rate:  64 PR Interval:  162 QRS Duration: 102 QT Interval:  418 QTC Calculation: 431 R Axis:   80 Text Interpretation:  Normal sinus rhythm Normal ECG Confirmed by  Alvino Chapel  MD, Ovid Curd [phone]) on 01/17/2015 4:50:02 PM      MDM   65 yo M with PMHx of HTN, HLD, CAD s/p NSTEMI in 05/2013, T2DM, and chronic atypical CP as well as IBS/chronic abdominal pain who presents with transient, sharp chest pain, now resolved, as well as acute on  chronic epigastric abdominal pain. See HPI above. On arrival, pt AF, VSS and WNL. Exam as above, pt overall very well-appearing and in NAD.  Regarding pt's chest pain, he describes a sharp, transient, positional CP in the lateral chest wall that occurred while having a forecful BM, which pt has a history of in the past. The CP resolved with positional change but is mildly reproducible on exam. This is highly c/w musculoskeletal chest  wall pain. No recent fever, cough, sputum production, or s/s of PNA or PTX but will evaluate on CXR. Pt has no tachycardia, SOB, tachypnea, and CP resolved with position change and do not suspet PE. Re: possible cardiac component, see below. Will send CXR, bsaic labs, and re-assess. NSAIDs contraindicated given h/o gastritis and H.Pylori.  Regarding abdominal pain, this is similar in quality and severity to his chronic gastritis/abdominal pain, for which he is on appropraite tx. No recent NSAID or EtOH use. Will check basic labs. Will also check troponin given his h/o CAD, but pain is chronic, similar to his usual atypical/abdominal pain, and I do not suspect ACS at this time. Given sx present for >12 hours, will check delta tropoinin with d/c if negative. Pain is mild, not sharp, and not c/w dissection. Abdomen is o/w soft, non-tender, without evidence of SBO, peritonitis, cholecystitis, appendicitis, or other acute intra-abdominal emergency.  Labs reviewed as above. CBC with no leukocytosis or anemia. BMP at baseline and LFTs, lipae normal without evidence of hepatitis or pancreatitis. Troponin negative. CXR clear and EKG without acute changes. Will plan for delta. Pain resolved with GI cocktail.  Delta remains undetectable. Pain resolved. Will d/c with outpt f/u and return precautions.  Clinical Impression: 1. Atypical chest pain   2. IBS (irritable bowel syndrome)     Disposition: Discharge  Condition: Good  I have discussed the results, Dx and Tx plan with the  pt(& family if present). He/she/they expressed understanding and agree(s) with the plan. Discharge instructions discussed at great length. Strict return precautions discussed and pt &/or family have verbalized understanding of the instructions. No further questions at time of discharge.    Discharge Medication List as of 01/17/2015  8:19 PM      Follow Up: Marijean Bravo, MD [address] [place] [address] [phone]  In 3 days Follow-up with your PCP in 3-5 days for repeat exam and ED follow-up   Pt seen in conjunction with Dr. Zandra Abts, MD 01/18/15 [place]. Alvino Chapel, MD 01/21/15 [phone]

## 2015-01-17 NOTE — ED Notes (Signed)
Pt placed into gown and on monitor upon arrival to room. Pt monitored by blood pressure, pulse ox, and 5 lead. Pt asked to remove clothing underneath gown but pt refused to get undressed. Pt placed on monitor under clothes.

## 2015-01-17 NOTE — ED Notes (Signed)
Patient transported to X-ray 

## 2015-07-04 ENCOUNTER — Telehealth: Payer: Self-pay

## 2015-07-04 DIAGNOSIS — M79605 Pain in left leg: Secondary | ICD-10-CM

## 2015-07-04 DIAGNOSIS — Z95828 Presence of other vascular implants and grafts: Secondary | ICD-10-CM

## 2015-07-04 DIAGNOSIS — I998 Other disorder of circulatory system: Secondary | ICD-10-CM

## 2015-07-04 NOTE — Telephone Encounter (Signed)
Rec'd request for an ASAP appt. from Dr. Jeronimo Greaves for Severe Ischemia Left LE, and redness /swelling left foot over past 2 days; stated pt. is in constant pain.  Discussed with Dr. Donnetta Hutching.  Recommended to schedule ABI's and MD appt. 07/05/15.

## 2015-07-05 ENCOUNTER — Ambulatory Visit (HOSPITAL_COMMUNITY)
Admission: RE | Admit: 2015-07-05 | Discharge: 2015-07-05 | Disposition: A | Discharge status: Home / Self Care | Payer: Medicaid Other | Source: Ambulatory Visit | Attending: Vascular Surgery | Admitting: Vascular Surgery

## 2015-07-05 ENCOUNTER — Ambulatory Visit (INDEPENDENT_AMBULATORY_CARE_PROVIDER_SITE_OTHER): Payer: Medicaid Other | Admitting: Vascular Surgery

## 2015-07-05 ENCOUNTER — Encounter: Payer: Self-pay | Admitting: Vascular Surgery

## 2015-07-05 VITALS — BP 125/95 | HR 80 | Temp 97.9°F | Ht 74.0 in | Wt 145.5 lb

## 2015-07-05 DIAGNOSIS — Z716 Tobacco abuse counseling: Secondary | ICD-10-CM | POA: Diagnosis not present

## 2015-07-05 DIAGNOSIS — L97909 Non-pressure chronic ulcer of unspecified part of unspecified lower leg with unspecified severity: Secondary | ICD-10-CM

## 2015-07-05 DIAGNOSIS — Z9889 Other specified postprocedural states: Secondary | ICD-10-CM | POA: Insufficient documentation

## 2015-07-05 DIAGNOSIS — M79605 Pain in left leg: Secondary | ICD-10-CM | POA: Diagnosis not present

## 2015-07-05 DIAGNOSIS — I998 Other disorder of circulatory system: Secondary | ICD-10-CM | POA: Insufficient documentation

## 2015-07-05 DIAGNOSIS — I70299 Other atherosclerosis of native arteries of extremities, unspecified extremity: Secondary | ICD-10-CM

## 2015-07-05 DIAGNOSIS — Z95828 Presence of other vascular implants and grafts: Secondary | ICD-10-CM

## 2015-07-05 NOTE — Progress Notes (Signed)
[facility] [place]  Patient name: [name] [name] MRN: [id number] DOB: 1950/12/30 Sex: male  REASON FOR VISIT: Atherosclerosis with ulceration  HPI: [name] [name] is a 64 y.o. male who was last seen by Dr. Donnetta Hutching on 05/03/2014. He has undergone previous aortobifemoral bypass graft in December 2012. He had a prior femorofemoral bypass graft with early occlusion secondary to limited inflow from the donor artery. At the time of this visit he was noted to have a right inguinal hernia. He had known chronic SFA occlusions. Dr. Donnetta Hutching planned on seeing him back as needed.  He is seen today as an add-on. Dr. Jeronimo Greaves referred him with a wound on the left foot. He tells me that he dropped something on his left great toe about 10 days ago. This toe was discolored but has improved. He recently had some new shoes and developed a blister on the dorsum of his left foot. I do not get any clear-cut history of claudication although I think his activity is fairly limited. He denies any history of rest pain or previous nonhealing ulcers. He denies any fever or chills. He states that both the left great toe and the wound on the dorsum of his foot have improved significantly.  He does smoke 1 pack per day of cigarettes.  Past Medical History  Diagnosis Date  . Hyperlipidemia   . Hypertension   . Leg pain   . Peripheral vascular disease     s/p prior Ao-Bifem BPG  . 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 Coronary artery disease     a. NSTEMI 7/14=> LHC 06/07/13: Proximal LAD 30%, ostial D1 30%, AV circumflex 80%, then occluded, ostial OM2 occluded, left to left collaterals, mid RCA 95%, inferior HK, EF 60%. PCI: Promus DES to the mid RCA.  No intervention was recommended for the CTO circumflex.  . Ischemic cardiomyopathy     a. Echocardiogram 06/06/13: EF 35-40%, inferior AK, mild MR.  Marland Kitchen Shortness of breath     "can come on at anytime  lately; before that it was just w/exertion" (11/10/2013)  . Type II diabetes mellitus   . Grand mal seizure     "last one was ~ 3 yr ago; controlled w/daily RX" (11/10/2013)  . Arthritis     "neck, back, legs" (11/10/2013)  . Kidney stones     "when I was younger; passed w/o treatment" (11/10/2013)   Family History  Problem Relation Age of Onset  . Hypertension Mother   . Heart attack Mother   . Diabetes Mother   . Aneurysm Father   . Heart attack Sister   . Heart disease Brother     heart transplant   SOCIAL HISTORY: Social History  Substance Use Topics  . Smoking status: Current Every Day Smoker -- 1.00 packs/day for 46 years    Types: Cigarettes  . Smokeless tobacco: Never Used  . Alcohol Use: 0.0 oz/week    0 Standard drinks or equivalent per week     Comment: a12/17/20214 "last drink was 3-4 yr ago; never had problem w/it"   Allergies  Allergen Reactions  . Zetia [Ezetimibe] Anaphylaxis and Swelling    Tongue and throat   . Atorvastatin Other (See Comments)    Malaise & muscle weakness  . Penicillins Other (See Comments)    Unknown  . Pravastatin Sodium     Pravastatin 40 mg qday and 40  mg q M/W/F caused muscle aches   Current Outpatient Prescriptions  Medication Sig Dispense Refill  . albuterol (PROVENTIL HFA;VENTOLIN HFA) 108 (90 BASE) MCG/ACT inhaler Inhale 2 puffs into the lungs every 6 (six) hours as needed for wheezing or shortness of breath. 1 Inhaler 2  . glipiZIDE (GLUCOTROL) 10 MG tablet Take 10 mg by mouth 2 (two) times daily before a meal.    . ketoconazole (NIZORAL) 2 % cream Apply 1 application topically daily as needed for irritation.    . Multiple Vitamins-Minerals (MULTIVITAMIN WITH MINERALS) tablet Take 4 tablets by mouth 3 (three) times daily.    Marland Kitchen oxycodone (ROXICODONE) 30 MG immediate release tablet Take 30 mg by mouth every 8 (eight) hours as needed for pain.    Marland Kitchen PATADAY 0.2 % SOLN   3  . phenytoin (DILANTIN) 100 MG ER capsule Take 200-300  mg by mouth 2 (two) times daily. Take 200 mg in the morning & 300 mg in the evening.    . promethazine (PHENERGAN) 25 MG tablet Take 1 tablet (25 mg total) by mouth every 8 (eight) hours as needed for nausea or vomiting. 15 tablet 0  . triamcinolone cream (KENALOG) 0.1 % Apply 1 application topically 2 (two) times daily as needed (for eczema).    . zolpidem (AMBIEN) 10 MG tablet Take 10 mg by mouth at bedtime as needed for sleep.    . Calcium-Magnesium-Vitamin D (CALCIUM MAGNESIUM PO) Take 4 tablets by mouth 3 (three) times daily.    Marland Kitchen esomeprazole (NEXIUM) 40 MG capsule Take 40 mg by mouth daily as needed (for acid reflex).     Marland Kitchen HYDROcodone-acetaminophen (NORCO/VICODIN) 5-325 MG per tablet Take 1 tablet by mouth every 6 (six) hours as needed for moderate pain. (Patient not taking: Reported on 07/05/2015) 15 tablet 0  . HYDROMET 5-1.5 MG/5ML syrup   0  . Influenza Vac Split Quad (FLUZONE) 0.25 ML injection Inject 0.25 mLs into the muscle once.    . nitroGLYCERIN (NITROSTAT) 0.4 MG SL tablet Place 0.4 mg under the tongue every 5 (five) minutes as needed for chest pain.    . polyethylene glycol (MIRALAX / GLYCOLAX) packet Take 17 g by mouth 2 (two) times daily.     . sucralfate (CARAFATE) 1 G tablet Take 1 tablet (1 g total) by mouth 4 (four) times daily -  with meals and at bedtime. (Patient not taking: Reported on 07/05/2015) 30 tablet 0   No current facility-administered medications for this visit.   REVIEW OF SYSTEMS: Valu.Nieves ] denotes positive finding; [  ] denotes negative finding  CARDIOVASCULAR:  [ ]  chest pain   [ ]  chest pressure   [ ]  palpitations   [ ]  orthopnea   [ ]  dyspnea on exertion   Valu.Nieves ] claudication   [ ]  rest pain   [ ]  DVT   [ ]  phlebitis PULMONARY:   [ ]  productive cough   [ ]  asthma   [ ]  wheezing NEUROLOGIC:   [ ]  weakness  [ ]  paresthesias  [ ]  aphasia  [ ]  amaurosis  [ ]  dizziness HEMATOLOGIC:   [ ]  bleeding problems   [ ]  clotting disorders MUSCULOSKELETAL:  [ ]  joint pain    [ ]  joint swelling [ ]  leg swelling GASTROINTESTINAL: [ ]   blood in stool  [ ]   hematemesis GENITOURINARY:  [ ]   dysuria  [ ]   hematuria PSYCHIATRIC:  [ ]  history of major depression INTEGUMENTARY:  [ ]  rashes  [ ]   ulcers CONSTITUTIONAL:  [ ]  fever   [ ]  chills  PHYSICAL EXAM: Filed Vitals:   07/05/15 1055  BP: 125/95  Pulse: 80  Temp: 97.9 F (36.6 C)  TempSrc: Oral  Height: 6\' 2"  (1.88 m)  Weight: 145 lb 8 oz (65.998 kg)  SpO2: 99%   GENERAL: The patient is a well-nourished male, in no acute distress. The vital signs are documented above. CARDIAC: There is a regular rate and rhythm.  VASCULAR: I do not detect carotid bruits. On the right side, he has a palpable femoral pulse. I cannot palpate pedal pulses. On the left side, he has a palpable femoral pulse. I cannot palpate pedal pulses. PULMONARY: There is good air exchange bilaterally without wheezing or rales. ABDOMEN: Soft and non-tender with normal pitched bowel sounds.  MUSCULOSKELETAL: There are no major deformities or cyanosis. NEUROLOGIC: No focal weakness or paresthesias are detected. SKIN: There is some bruising of the left great toe but no open ulcer. There is a superficial blister on the dorsum of the left foot which measures approximately 1 cm in diameter. There is no significant erythema or drainage. PSYCHIATRIC: The patient has a normal affect.  DATA:  I reviewed his duplex scan from last year which showed that the aortofemoral bypass graft was patent. ABIs at that time were 63% on the right and 64% on the left with monophasic Doppler signals in the posterior tibial arteries bilaterally.  I have independently interpreted his arterial Doppler study today which shows that he has monophasic Doppler signals in the posterior tibial positions bilaterally. ABI on the right is 61%. ABI on the left is 53%.  MEDICAL ISSUES:  INFRAINGUINAL ARTERIAL OCCLUSIVE DISEASE: His aortobifemoral bypass graft is functioning well and  his ABIs have not changed over the last year. He has known superficial femoral artery occlusions. ABI on the left is 53%. Given that the wounds on his foot are improving according to the patient, I think it is safe to follow these wounds closely. I have explained however, that if the wounds do not heal he would require arteriography in evaluation for a possible left femoropopliteal bypass graft. I have scheduled him to see Dr. Donnetta Hutching in 3 weeks. He knows to call sooner if the wounds do not continue to improve or he develops any fever. We have also had a long discussion about the importance of tobacco cessation and how this affects wound healing. I spent greater than 3 minutes on this discussion.  HYPERTENSION: The patient's initial blood pressure today was elevated. We repeated this and this was still elevated. We have encouraged the patient to follow up with their primary care physician for management of their blood pressure.   No Follow-up on file.   Deitra Mayo [facility] [place]: [phone]

## 2015-07-05 NOTE — Patient Instructions (Addendum)
[facility]           [place]                 [phone]      Please review the tobacco cessation information given to you today. It lists many hints that are useful in your effort to stop smoking. The [place] [facility]ess[facility] contact phone # is [phone] These nurses and advisors offer lots of FREE information and aids to help you quit.    The [place] Quit Smoking line #  [phone], they will also assist you with programs designed to help you stop smoking.     Peripheral Vascular Disease Peripheral Vascular Disease (PVD), also called Peripheral Arterial Disease (PAD), is a circulation problem caused by cholesterol (atherosclerotic plaque) deposits in the arteries. PVD commonly occurs in the lower extremities (legs) but it can occur in other areas of the body, such as your arms. The cholesterol buildup in the arteries reduces blood flow which can cause pain and other serious problems. The presence of PVD can place a person at risk for Coronary Artery Disease (CAD).  CAUSES  Causes of PVD can be many. It is usually associated with more than one risk factor such as:   High Cholesterol.  Smoking.  Diabetes.  Lack of exercise or inactivity.  High blood pressure (hypertension).  Obesity.  Family history. SYMPTOMS   When the lower extremities are affected, patients with PVD may experience:  Leg pain with exertion or physical activity. This is called INTERMITTENT CLAUDICATION. This may present as cramping or numbness with physical activity. The location of the pain is associated with the level of blockage. For example, blockage at the abdominal level (distal abdominal aorta) may result in buttock or hip pain. Lower leg arterial blockage may result in calf pain.  As PVD becomes more severe, pain can develop with less physical activity.  In people with severe PVD, leg pain may occur at rest.  Other PVD signs and symptoms:  Leg numbness or  weakness.  Coldness in the affected leg or foot, especially when compared to the other leg.  A change in leg color.  Patients with significant PVD are more prone to ulcers or sores on toes, feet or legs. These may take longer to heal or may reoccur. The ulcers or sores can become infected.  If signs and symptoms of PVD are ignored, gangrene may occur. This can result in the loss of toes or loss of an entire limb.  Not all leg pain is related to PVD. Other medical conditions can cause leg pain such as:  Blood clots (embolism) or Deep Vein Thrombosis.  Inflammation of the blood vessels (vasculitis).  Spinal stenosis. DIAGNOSIS  Diagnosis of PVD can involve several different types of tests. These can include:  Pulse Volume Recording Method (PVR). This test is simple, painless and does not involve the use of X-rays. PVR involves measuring and comparing the blood pressure in the arms and legs. An ABI (Ankle-Brachial Index) is calculated. The normal ratio of blood pressures is 1. As this number becomes smaller, it indicates more severe disease.  < 0.95 - indicates significant narrowing in one or more leg vessels.  <0.8 - there will usually be pain in the foot, leg or buttock with exercise.  <0.4 - will usually have pain in the legs at rest.  <0.25 - usually indicates limb threatening PVD.  Doppler detection of pulses in the legs. This test  is painless and checks to see if you have a pulses in your legs/feet.  A dye or contrast material (a substance that highlights the blood vessels so they show up on x-ray) may be given to help your caregiver better see the arteries for the following tests. The dye is eliminated from your body by the kidney's. Your caregiver may order blood work to check your kidney function and other laboratory values before the following tests are performed:  Magnetic Resonance Angiography (MRA). An MRA is a picture study of the blood vessels and arteries. The MRA  machine uses a large magnet to produce images of the blood vessels.  Computed Tomography Angiography (CTA). A CTA is a specialized x-ray that looks at how the blood flows in your blood vessels. An IV may be inserted into your arm so contrast dye can be injected.  Angiogram. Is a procedure that uses x-rays to look at your blood vessels. This procedure is minimally invasive, meaning a small incision (cut) is made in your groin. A small tube (catheter) is then inserted into the artery of your groin. The catheter is guided to the blood vessel or artery your caregiver wants to examine. Contrast dye is injected into the catheter. X-rays are then taken of the blood vessel or artery. After the images are obtained, the catheter is taken out. TREATMENT  Treatment of PVD involves many interventions which may include:  Lifestyle changes:  Quitting smoking.  Exercise.  Following a low fat, low cholesterol diet.  Control of diabetes.  Foot care is very important to the PVD patient. Good foot care can help prevent infection.  Medication:  Cholesterol-lowering medicine.  Blood pressure medicine.  Anti-platelet drugs.  Certain medicines may reduce symptoms of Intermittent Claudication.  Interventional/Surgical options:  Angioplasty. An Angioplasty is a procedure that inflates a balloon in the blocked artery. This opens the blocked artery to improve blood flow.  Stent Implant. A wire mesh tube (stent) is placed in the artery. The stent expands and stays in place, allowing the artery to remain open.  Peripheral Bypass Surgery. This is a surgical procedure that reroutes the blood around a blocked artery to help improve blood flow. This type of procedure may be performed if Angioplasty or stent implants are not an option. SEEK IMMEDIATE MEDICAL CARE IF:   You develop pain or numbness in your arms or legs.  Your arm or leg turns cold, becomes blue in color.  You develop redness, warmth, swelling  and pain in your arms or legs. MAKE SURE YOU:   Understand these instructions.  Will watch your condition.  Will get help right away if you are not doing well or get worse.

## 2015-07-13 ENCOUNTER — Encounter (HOSPITAL_COMMUNITY): Payer: Self-pay | Admitting: *Deleted

## 2015-07-13 ENCOUNTER — Emergency Department (HOSPITAL_COMMUNITY)
Admission: EM | Admit: 2015-07-13 | Discharge: 2015-07-13 | Disposition: A | Discharge status: Home / Self Care | Payer: Medicaid Other | Source: Home / Self Care | Attending: Family Medicine | Admitting: Family Medicine

## 2015-07-13 DIAGNOSIS — E10621 Type 1 diabetes mellitus with foot ulcer: Secondary | ICD-10-CM

## 2015-07-13 DIAGNOSIS — L97519 Non-pressure chronic ulcer of other part of right foot with unspecified severity: Secondary | ICD-10-CM

## 2015-07-13 MED ORDER — GABAPENTIN 300 MG PO CAPS: 300.0000 mg | ORAL_CAPSULE | Freq: Three times a day (TID) | ORAL | Status: DC

## 2015-07-13 NOTE — ED Provider Notes (Signed)
CSN: [id number]     Arrival date & time 07/13/15  1326 History   First MD Initiated Contact with Patient 07/13/15 1511     Chief Complaint  Patient presents with  . Wound Check   (Consider location/radiation/quality/duration/timing/severity/associated sxs/prior Treatment) Patient is a 64 y.o. male presenting with wound check. The history is provided by the patient.  Wound Check This is a new problem. The current episode started more than 1 week ago. The problem has not changed since onset.Associated symptoms comments: Diabetic foot pain seen by dr early and podiatrist..    Past Medical History  Diagnosis Date  . Hyperlipidemia   . Hypertension   . Leg pain   . Peripheral vascular disease     s/p prior Ao-Bifem BPG  . 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 Coronary artery disease     a. NSTEMI 7/14=> LHC 06/07/13: Proximal LAD 30%, ostial D1 30%, AV circumflex 80%, then occluded, ostial OM2 occluded, left to left collaterals, mid RCA 95%, inferior HK, EF 60%. PCI: Promus DES to the mid RCA.  No intervention was recommended for the CTO circumflex.  . Ischemic cardiomyopathy     a. Echocardiogram 06/06/13: EF 35-40%, inferior AK, mild MR.  Marland Kitchen Shortness of breath     "can come on at anytime lately; before that it was just w/exertion" (11/10/2013)  . Type II diabetes mellitus   . Grand mal seizure     "last one was ~ 3 yr ago; controlled w/daily RX" (11/10/2013)  . Arthritis     "neck, back, legs" (11/10/2013)  . Kidney stones     "when I was younger; passed w/o treatment" (11/10/2013)   Past Surgical History  Procedure Laterality Date  . Iliac artery stent  05/06/2011  . Tonsillectomy    . Aorta - bilateral femoral artery bypass graft  11/25/2011    Procedure: AORTA BIFEMORAL BYPASS GRAFT;  Surgeon: Rosetta Posner, MD;  Location: [place];  Service: Vascular;  Laterality: N/A;  . Pr vein bypass graft,aorto-fem-pop  11/24/2012  . Inguinal  hernia repair Right   . Abdominal aortagram N/A 11/11/2011    Procedure: ABDOMINAL Maxcine Ham;  Surgeon: Angelia Mould, MD;  Location: [facility] CATH LAB;  Service: Cardiovascular;  Laterality: N/A;  . Lower extremity angiogram Bilateral 11/11/2011    Procedure: LOWER EXTREMITY ANGIOGRAM;  Surgeon: Angelia Mould, MD;  Location: [facility] CATH LAB;  Service: Cardiovascular;  Laterality: Bilateral;  . Left heart catheterization with coronary angiogram N/A 06/07/2013    Procedure: LEFT HEART CATHETERIZATION WITH CORONARY ANGIOGRAM;  Surgeon: Sherren Mocha, MD;  Location: [facility] CATH LAB;  Service: Cardiovascular;  Laterality: N/A;  . Percutaneous coronary stent intervention (pci-s) Right 06/07/2013    Procedure: PERCUTANEOUS CORONARY STENT INTERVENTION (PCI-S);  Surgeon: Sherren Mocha, MD;  Location[facility] CATH LAB;  Service: Cardiovascular;  Laterality: Right;  . Left heart catheterization with coronary angiogram N/A 12/10/2013    Procedure: LEFT HEART CATHETERIZATION WITH CORONARY ANGIOGRAM;  Surgeon: Blane Ohara, MD;  Location: [facility] CATH LAB;  Service: Cardiovascular;  Laterality: N/A;   Family History  Problem Relation Age of Onset  . Hypertension Mother   . Heart attack Mother   . Diabetes Mother   . Aneurysm Father   . Heart attack Sister   . Heart disease Brother     heart transplant   Social History  Substance Use Topics  . Smoking status: Current Every Day  Smoker -- 1.00 packs/day for 46 years    Types: Cigarettes  . Smokeless tobacco: Never Used  . Alcohol Use: 0.0 oz/week    0 Standard drinks or equivalent per week     Comment: [id number]/17/[id number] "last drink was 3-4 yr ago; never had problem w/it"    Review of Systems  Constitutional: Negative.   Musculoskeletal: Positive for joint swelling.  Skin: Positive for wound.    Allergies  Zetia; Atorvastatin; Penicillins; and Pravastatin sodium  Home Medications   Prior to Admission medications   Medication Sig Start Date End Date  Taking? Authorizing Provider  albuterol (PROVENTIL HFA;VENTOLIN HFA) 108 (90 BASE) MCG/ACT inhaler Inhale 2 puffs into the lungs every 6 (six) hours as needed for wheezing or shortness of breath. 11/11/13   Ripudeep Krystal Eaton, MD  Calcium-Magnesium-Vitamin D (CALCIUM MAGNESIUM PO) Take 4 tablets by mouth 3 (three) times daily.    Historical Provider, MD  esomeprazole (NEXIUM) 40 MG capsule Take 40 mg by mouth daily as needed (for acid reflex).     Historical Provider, MD  gabapentin (NEURONTIN) 300 MG capsule Take 1 capsule (300 mg total) by mouth 3 (three) times daily. 07/13/15   Billy Fischer, MD  glipiZIDE (GLUCOTROL) 10 MG tablet Take 10 mg by mouth 2 (two) times daily before a meal.    Historical Provider, MD  HYDROcodone-acetaminophen (NORCO/VICODIN) 5-325 MG per tablet Take 1 tablet by mouth every 6 (six) hours as needed for moderate pain. Patient not taking: Reported on 07/05/2015 09/11/14   Dalia Heading, PA-C  HYDROMET 5-1.5 MG/5ML syrup  08/30/14   Historical Provider, MD  Influenza Vac Split Quad (FLUZONE) 0.25 ML injection Inject 0.25 mLs into the muscle once.    Historical Provider, MD  ketoconazole (NIZORAL) 2 % cream Apply 1 application topically daily as needed for irritation.    Historical Provider, MD  Multiple Vitamins-Minerals (MULTIVITAMIN WITH MINERALS) tablet Take 4 tablets by mouth 3 (three) times daily.    Historical Provider, MD  nitroGLYCERIN (NITROSTAT) 0.4 MG SL tablet Place 0.4 mg under the tongue every 5 (five) minutes as needed for chest pain.    Historical Provider, MD  oxycodone (ROXICODONE) 30 MG immediate release tablet Take 30 mg by mouth every 8 (eight) hours as needed for pain.    Historical Provider, MD  PATADAY 0.2 % SOLN  08/11/14   Historical Provider, MD  phenytoin (DILANTIN) 100 MG ER capsule Take 200-300 mg by mouth 2 (two) times daily. Take 200 mg in the morning & 300 mg in the evening.    Historical Provider, MD  polyethylene glycol (MIRALAX / GLYCOLAX)  packet Take 17 g by mouth 2 (two) times daily.     Historical Provider, MD  promethazine (PHENERGAN) 25 MG tablet Take 1 tablet (25 mg total) by mouth every 8 (eight) hours as needed for nausea or vomiting. 09/11/14   Christopher Lawyer, PA-C  sucralfate (CARAFATE) 1 G tablet Take 1 tablet (1 g total) by mouth 4 (four) times daily -  with meals and at bedtime. Patient not taking: Reported on 07/05/2015 09/11/14   Dalia Heading, PA-C  triamcinolone cream (KENALOG) 0.1 % Apply 1 application topically 2 (two) times daily as needed (for eczema).    Historical Provider, MD  zolpidem (AMBIEN) 10 MG tablet Take 10 mg by mouth at bedtime as needed for sleep.    Historical Provider, MD   BP 146/85 mmHg  Pulse 61  Temp(Src) 97.3 F (36.3 C) (Oral)  Resp 16  SpO2 99% Physical Exam  Constitutional: He is oriented to person, place, and time. He appears well-developed and well-nourished.  Neurological: He is alert and oriented to person, place, and time.  Skin: Skin is warm and dry.  Avascular diabetic foot wound on dorsum of right foot, no sign of infection,absent pedal pulses.  Nursing note and vitals reviewed.   ED Course  Procedures (including critical care time) Labs Review Labs Reviewed - No data to display  Imaging Review No results found.   MDM   1. Diabetic ulcer of right foot associated with type 1 diabetes mellitus        Billy Fischer, MD 07/13/15 1553

## 2015-07-13 NOTE — ED Notes (Signed)
Pt    Here  For  Wound  Check       He  Reports  He  Injured  His  l  Big  Toe           About   1  Month  Ago     He  Has     Seen       A  Advertising account planner and  A   Podiatrist    He  Has  Sores  As  Well  As  A   Dusky  Appearance        He  Also  Reports    Pain in  The  Affected  Foot     With areas  Of  Skin breakdown

## 2015-07-24 ENCOUNTER — Encounter: Payer: Self-pay | Admitting: Vascular Surgery

## 2015-07-25 ENCOUNTER — Ambulatory Visit (INDEPENDENT_AMBULATORY_CARE_PROVIDER_SITE_OTHER): Payer: Medicaid Other | Admitting: Vascular Surgery

## 2015-07-25 ENCOUNTER — Other Ambulatory Visit: Payer: Self-pay

## 2015-07-25 ENCOUNTER — Encounter: Payer: Self-pay | Admitting: Vascular Surgery

## 2015-07-25 VITALS — BP 132/89 | HR 66 | Temp 97.0°F | Resp 18 | Ht 74.0 in | Wt 143.7 lb

## 2015-07-25 DIAGNOSIS — T8131XD Disruption of external operation (surgical) wound, not elsewhere classified, subsequent encounter: Secondary | ICD-10-CM | POA: Diagnosis not present

## 2015-07-25 DIAGNOSIS — I998 Other disorder of circulatory system: Secondary | ICD-10-CM

## 2015-07-25 DIAGNOSIS — I70229 Atherosclerosis of native arteries of extremities with rest pain, unspecified extremity: Secondary | ICD-10-CM

## 2015-07-25 MED ORDER — SILVER SULFADIAZINE 1 % EX CREA: 1.0000 "application " | TOPICAL_CREAM | Freq: Every day | CUTANEOUS | Status: DC

## 2015-07-25 NOTE — Progress Notes (Signed)
Here today for continued follow-up of his left foot. Seen Dr. Scot Dock 3 weeks ago. He is well-known to me from prior aortobifemoral bypass for severe rest pain in 2012. He initially had trauma to the dorsum of his left great toe. He also has some blistering on the dorsum of his foot. This has progressed extensively since his visit with Dr. Scot Dock 3 weeks ago. He now has a large 5-6 cm full-thickness ulcer on the dorsum of his foot. His toe actually looks okay. He has diffuse erythema of his foot as well.  3+ femoral pulse from his aortobifemoral bypass.  I reviewed his most recent arteriogram which was 2012. At that time he had bilateral superficial femoral artery occlusions with reconstitution of his above-knee popliteal and 2 vessel runoff.  Impression and plan limb threatening ischemia with extensive extensive tissue loss on the dorsum of his left foot. Explained that he will certainly lose his foot without revascularization. He will undergo arteriogram tomorrow and we will plan for infrainguinal bypass Friday, 07/28/2015.

## 2015-07-26 ENCOUNTER — Encounter (HOSPITAL_COMMUNITY): Payer: Self-pay | Admitting: *Deleted

## 2015-07-26 ENCOUNTER — Ambulatory Visit (HOSPITAL_COMMUNITY): Payer: Medicaid Other

## 2015-07-26 ENCOUNTER — Encounter (HOSPITAL_COMMUNITY)
Admission: RE | Disposition: A | Discharge status: Home / Self Care | Payer: Medicaid Other | Source: Ambulatory Visit | Attending: Surgery

## 2015-07-26 ENCOUNTER — Other Ambulatory Visit: Payer: Self-pay

## 2015-07-26 ENCOUNTER — Ambulatory Visit (HOSPITAL_COMMUNITY)
Admission: RE | Admit: 2015-07-26 | Discharge: 2015-07-26 | Disposition: A | Discharge status: Home / Self Care | Payer: Medicaid Other | Source: Ambulatory Visit | Attending: Surgery | Admitting: Surgery

## 2015-07-26 DIAGNOSIS — I70245 Atherosclerosis of native arteries of left leg with ulceration of other part of foot: Secondary | ICD-10-CM | POA: Insufficient documentation

## 2015-07-26 DIAGNOSIS — L97529 Non-pressure chronic ulcer of other part of left foot with unspecified severity: Secondary | ICD-10-CM | POA: Insufficient documentation

## 2015-07-26 DIAGNOSIS — Z95828 Presence of other vascular implants and grafts: Secondary | ICD-10-CM | POA: Diagnosis not present

## 2015-07-26 DIAGNOSIS — I70248 Atherosclerosis of native arteries of left leg with ulceration of other part of lower left leg: Secondary | ICD-10-CM | POA: Diagnosis present

## 2015-07-26 DIAGNOSIS — I70244 Atherosclerosis of native arteries of left leg with ulceration of heel and midfoot: Secondary | ICD-10-CM | POA: Diagnosis not present

## 2015-07-26 HISTORY — PX: PERIPHERAL VASCULAR CATHETERIZATION: SHX172C

## 2015-07-26 LAB — POCT I-STAT, CHEM 8
BUN: 11 mg/dL (ref 6–20)
CREATININE: 0.6 mg/dL — AB (ref 0.61–1.24)
Calcium, Ion: 1.25 mmol/L (ref 1.13–1.30)
Chloride: 98 mmol/L — ABNORMAL LOW (ref 101–111)
GLUCOSE: 136 mg/dL — AB (ref 65–99)
HEMATOCRIT: 44 % (ref 39.0–52.0)
Hemoglobin: 15 g/dL (ref 13.0–17.0)
POTASSIUM: 4.3 mmol/L (ref 3.5–5.1)
Sodium: 134 mmol/L — ABNORMAL LOW (ref 135–145)
TCO2: 28 mmol/L (ref 0–100)

## 2015-07-26 LAB — TYPE AND SCREEN
ABO/RH(D): A NEG
Antibody Screen: NEGATIVE

## 2015-07-26 LAB — URINALYSIS, ROUTINE W REFLEX MICROSCOPIC
Bilirubin Urine: NEGATIVE
Glucose, UA: NEGATIVE mg/dL
Hgb urine dipstick: NEGATIVE
Ketones, ur: NEGATIVE mg/dL
LEUKOCYTES UA: NEGATIVE
NITRITE: NEGATIVE
PROTEIN: NEGATIVE mg/dL
SPECIFIC GRAVITY, URINE: 1.005 (ref 1.005–1.030)
UROBILINOGEN UA: 0.2 mg/dL (ref 0.0–1.0)
pH: 7.5 (ref 5.0–8.0)

## 2015-07-26 LAB — CBC
HEMATOCRIT: 41.6 % (ref 39.0–52.0)
HEMOGLOBIN: 13.8 g/dL (ref 13.0–17.0)
MCH: 30.5 pg (ref 26.0–34.0)
MCHC: 33.2 g/dL (ref 30.0–36.0)
MCV: 92 fL (ref 78.0–100.0)
Platelets: 197 10*3/uL (ref 150–400)
RBC: 4.52 MIL/uL (ref 4.22–5.81)
RDW: 13.2 % (ref 11.5–15.5)
WBC: 4.7 10*3/uL (ref 4.0–10.5)

## 2015-07-26 LAB — COMPREHENSIVE METABOLIC PANEL
ALBUMIN: 3.2 g/dL — AB (ref 3.5–5.0)
ALK PHOS: 76 U/L (ref 38–126)
ALT: 21 U/L (ref 17–63)
ANION GAP: 6 (ref 5–15)
AST: 22 U/L (ref 15–41)
BILIRUBIN TOTAL: 0.5 mg/dL (ref 0.3–1.2)
BUN: 7 mg/dL (ref 6–20)
CALCIUM: 9.4 mg/dL (ref 8.9–10.3)
CO2: 30 mmol/L (ref 22–32)
CREATININE: 0.64 mg/dL (ref 0.61–1.24)
Chloride: 99 mmol/L — ABNORMAL LOW (ref 101–111)
GFR calc Af Amer: >60 mL/min (ref >60)
GFR calc non Af Amer: >60 mL/min (ref >60)
GLUCOSE: 195 mg/dL — AB (ref 65–99)
Potassium: 4 mmol/L (ref 3.5–5.1)
Sodium: 135 mmol/L (ref 135–145)
TOTAL PROTEIN: 6.8 g/dL (ref 6.5–8.1)

## 2015-07-26 LAB — GLUCOSE, CAPILLARY: Glucose-Capillary: 116 mg/dL — ABNORMAL HIGH (ref 65–99)

## 2015-07-26 LAB — PROTIME-INR
INR: 1.09 (ref 0.00–1.49)
Prothrombin Time: 14.3 seconds (ref 11.6–15.2)

## 2015-07-26 LAB — SURGICAL PCR SCREEN
MRSA, PCR: NEGATIVE
Staphylococcus aureus: NEGATIVE

## 2015-07-26 LAB — APTT: APTT: 37 s (ref 24–37)

## 2015-07-26 SURGERY — ABDOMINAL AORTOGRAM

## 2015-07-26 MED ORDER — MIDAZOLAM HCL 2 MG/2ML IJ SOLN
INTRAMUSCULAR | Status: AC
  Filled 2015-07-26: qty 4

## 2015-07-26 MED ORDER — ALUM & MAG HYDROXIDE-SIMETH 200-200-20 MG/5ML PO SUSP
15.0000 mL | ORAL | Status: DC | PRN
  Filled 2015-07-26: qty 30

## 2015-07-26 MED ORDER — FENTANYL CITRATE (PF) 100 MCG/2ML IJ SOLN
INTRAMUSCULAR | Status: DC | PRN
  Administered 2015-07-26: 50 ug via INTRAVENOUS
  Administered 2015-07-26: 25 ug via INTRAVENOUS

## 2015-07-26 MED ORDER — SODIUM CHLORIDE 0.9 % IV SOLN
INTRAVENOUS | Status: DC
  Administered 2015-07-26: 09:00:00 via INTRAVENOUS

## 2015-07-26 MED ORDER — OXYCODONE HCL 5 MG PO TABS
ORAL_TABLET | ORAL | Status: AC
  Administered 2015-07-26: 10 mg
  Filled 2015-07-26: qty 2

## 2015-07-26 MED ORDER — FENTANYL CITRATE (PF) 100 MCG/2ML IJ SOLN
INTRAMUSCULAR | Status: AC
  Filled 2015-07-26: qty 4

## 2015-07-26 MED ORDER — MIDAZOLAM HCL 2 MG/2ML IJ SOLN
INTRAMUSCULAR | Status: DC | PRN
  Administered 2015-07-26: 1 mg via INTRAVENOUS
  Administered 2015-07-26: 2 mg via INTRAVENOUS

## 2015-07-26 MED ORDER — MORPHINE SULFATE (PF) 2 MG/ML IV SOLN
INTRAVENOUS | Status: AC
  Administered 2015-07-26: 2 mg via INTRAVENOUS
  Filled 2015-07-26: qty 1

## 2015-07-26 MED ORDER — ACETAMINOPHEN 325 MG RE SUPP
325.0000 mg | RECTAL | Status: DC | PRN
  Filled 2015-07-26: qty 2

## 2015-07-26 MED ORDER — MORPHINE SULFATE (PF) 4 MG/ML IV SOLN
INTRAVENOUS | Status: AC
  Filled 2015-07-26: qty 1

## 2015-07-26 MED ORDER — DOCUSATE SODIUM 100 MG PO CAPS: 100.0000 mg | ORAL_CAPSULE | Freq: Every day | ORAL | Status: DC

## 2015-07-26 MED ORDER — MIDAZOLAM HCL 2 MG/2ML IJ SOLN
INTRAMUSCULAR | Status: AC
Start: 2015-07-26 — End: 2015-07-26
  Filled 2015-07-26: qty 4

## 2015-07-26 MED ORDER — LIDOCAINE HCL (PF) 1 % IJ SOLN
INTRAMUSCULAR | Status: AC
  Filled 2015-07-26: qty 30

## 2015-07-26 MED ORDER — GUAIFENESIN-DM 100-10 MG/5ML PO SYRP
15.0000 mL | ORAL_SOLUTION | ORAL | Status: DC | PRN
  Filled 2015-07-26: qty 15

## 2015-07-26 MED ORDER — SODIUM CHLORIDE 0.9 % IV SOLN: 1.0000 mL/kg/h | INTRAVENOUS | Status: DC

## 2015-07-26 MED ORDER — MORPHINE SULFATE (PF) 10 MG/ML IV SOLN
2.0000 mg | INTRAVENOUS | Status: DC | PRN
  Administered 2015-07-26: 4 mg via INTRAVENOUS

## 2015-07-26 MED ORDER — LABETALOL HCL 5 MG/ML IV SOLN
10.0000 mg | INTRAVENOUS | Status: DC | PRN
Start: 2015-07-26 — End: 2015-07-26

## 2015-07-26 MED ORDER — HEPARIN (PORCINE) IN NACL 2-0.9 UNIT/ML-% IJ SOLN
INTRAMUSCULAR | Status: AC
  Filled 2015-07-26: qty 1000

## 2015-07-26 MED ORDER — PHENOL 1.4 % MT LIQD: 1.0000 | OROMUCOSAL | Status: DC | PRN

## 2015-07-26 MED ORDER — HYDRALAZINE HCL 20 MG/ML IJ SOLN
5.0000 mg | INTRAMUSCULAR | Status: DC | PRN
Start: 2015-07-26 — End: 2015-07-26

## 2015-07-26 MED ORDER — ONDANSETRON HCL 4 MG/2ML IJ SOLN: 4.0000 mg | Freq: Four times a day (QID) | INTRAMUSCULAR | Status: DC | PRN

## 2015-07-26 MED ORDER — METOPROLOL TARTRATE 1 MG/ML IV SOLN: 2.0000 mg | INTRAVENOUS | Status: DC | PRN

## 2015-07-26 MED ORDER — LIDOCAINE HCL (PF) 1 % IJ SOLN
INTRAMUSCULAR | Status: DC | PRN
  Administered 2015-07-26: 12 mL

## 2015-07-26 MED ORDER — OXYCODONE HCL 5 MG PO TABS: 5.0000 mg | ORAL_TABLET | ORAL | Status: DC | PRN

## 2015-07-26 MED ORDER — ACETAMINOPHEN 325 MG PO TABS
325.0000 mg | ORAL_TABLET | ORAL | Status: DC | PRN
  Filled 2015-07-26: qty 2

## 2015-07-26 SURGICAL SUPPLY — 11 items
CATH ANGIO 5F PIGTAIL 65CM (CATHETERS) ×4 IMPLANT
CATH ANGIO 5F SIM1 100CM (CATHETERS) ×4 IMPLANT
COVER PRB 48X5XTLSCP FOLD TPE (BAG) ×2 IMPLANT
COVER PROBE 5X48 (BAG) ×2
KIT MICROINTRODUCER STIFF 5F (SHEATH) ×4 IMPLANT
KIT PV (KITS) ×4 IMPLANT
SHEATH PINNACLE 5F 10CM (SHEATH) ×4 IMPLANT
SYR MEDRAD MARK V 150ML (SYRINGE) ×4 IMPLANT
TRANSDUCER W/STOPCOCK (MISCELLANEOUS) ×4 IMPLANT
TRAY PV CATH (CUSTOM PROCEDURE TRAY) ×4 IMPLANT
WIRE BENTSON .035X145CM (WIRE) ×4 IMPLANT

## 2015-07-26 NOTE — H&P (View-Only) (Signed)
Here today for continued follow-up of his left foot. Seen Dr. Scot Dock 3 weeks ago. He is well-known to me from prior aortobifemoral bypass for severe rest pain in 2012. He initially had trauma to the dorsum of his left great toe. He also has some blistering on the dorsum of his foot. This has progressed extensively since his visit with Dr. Scot Dock 3 weeks ago. He now has a large 5-6 cm full-thickness ulcer on the dorsum of his foot. His toe actually looks okay. He has diffuse erythema of his foot as well.  3+ femoral pulse from his aortobifemoral bypass.  I reviewed his most recent arteriogram which was 2012. At that time he had bilateral superficial femoral artery occlusions with reconstitution of his above-knee popliteal and 2 vessel runoff.  Impression and plan limb threatening ischemia with extensive extensive tissue loss on the dorsum of his left foot. Explained that he will certainly lose his foot without revascularization. He will undergo arteriogram tomorrow and we will plan for infrainguinal bypass Friday, 07/28/2015.

## 2015-07-26 NOTE — Discharge Instructions (Signed)

## 2015-07-26 NOTE — Progress Notes (Signed)
   07/26/15 1334  OBSTRUCTIVE SLEEP APNEA  Have you ever been diagnosed with sleep apnea through a sleep study? No  Do you snore loudly (loud enough to be heard through closed doors)?  1  Do you often feel tired, fatigued, or sleepy during the daytime? 0  Has anyone observed you stop breathing during your sleep? 0  Do you have, or are you being treated for high blood pressure? 1  BMI more than 35 kg/m2? 0  Age over 63 years old? 1  Neck circumference greater than 40 cm/16 inches? 0  Gender: 1  Obstructive Sleep Apnea Score 4

## 2015-07-26 NOTE — Interval H&P Note (Signed)
History and Physical Interval Note:  07/26/2015 9:01 AM  [name] [name]  has presented today for surgery, with the diagnosis of left foot ulcer  The various methods of treatment have been discussed with the patient and family. After consideration of risks, benefits and other options for treatment, the patient has consented to  Procedure(s): Abdominal Aortogram (N/A) as a surgical intervention .  The patient's history has been reviewed, patient examined, no change in status, stable for surgery.  I have reviewed the patient's chart and labs.  Questions were answered to the patient's satisfaction.     [name]

## 2015-07-26 NOTE — Progress Notes (Signed)
Site area: rt groin Site Prior to Removal:  Level  0 Pressure Applied For:  20 minutes Manual:   yes Patient Status During Pull:  stable Post Pull Site:  Level  0 Post Pull Instructions Given:  yes Post Pull Pulses Present: yes Dressing Applied:  tegaderm Bedrest begins @  1130 Comments:  0

## 2015-07-26 NOTE — Progress Notes (Signed)
Pt denies SOB and chest pain but is under the care of Dr.Michael Burt Knack, cardiology at [facility]. Pt chart forwarded to [place], [place], anesthesia, to review cardiac history.

## 2015-07-26 NOTE — Progress Notes (Signed)
Pt was to be ambulated but when I went to get him a sock that would fit he was up and dressed. Pt could not reach a family member to pick up but got a neighbor to come and pick him up. Pt removed his own IV. Pt did have someone arrive at the front to take him home.

## 2015-07-26 NOTE — Progress Notes (Signed)
Patient ID: [name] [name], male   DOB: 09-26-1951, 63 y.o.   MRN: [id number] Reviewed his arteriogram. Discussed need for left femoral to pop bypass. Has good appearing saphenous vein by physical exam. We'll discharge to home this afternoon and admit on 07/28/2015 for bypass

## 2015-07-26 NOTE — Op Note (Signed)
    Patient name: [name] [name] MRN: [id number] DOB: 23-Feb-1951 Sex: male  07/26/2015 Pre-operative Diagnosis: Left leg ulcer Post-operative diagnosis:  Same Surgeon:  [name] Procedure Performed:  1.  Ultrasound-guided access, right femoral artery  2.  Abdominal aortogram  3.  First order catheterization  4.  Bilateral lower extremity runoff     Indications:  The patient has a history of an aortobifemoral bypass graft.  He has developed an ulcer and rest pain of the left.  He is here for further evaluation.  Procedure:  The patient was identified in the holding area and taken to room 8.  The patient was then placed supine on the table and prepped and draped in the usual sterile fashion.  A time out was called.  Ultrasound was used to evaluate the right common femoral artery.  It was patent .  A digital ultrasound image was acquired.  A micropuncture needle was used to access the right common femoral artery under ultrasound guidance.  An 018 wire was advanced without resistance and a micropuncture sheath was placed.  The 018 wire was removed and a benson wire was placed.  The micropuncture sheath was exchanged for a 5 french sheath.  An omniflush catheter was advanced over the wire to the level of L-1.  An abdominal angiogram was obtained.  Next, the cath was pulled down to the graft bifurcation and a pelvic antegrade and was performed.  A Simmons 1 catheter was then used to cross the aortic bifurcation and with the catheter tip in the left common iliac artery the left lower sternum a runoff was performed.  Retrograde injections were performed to evaluate the right leg.  Findings:   Aortogram:  No significant renal artery stenosis is identified.  The infrarenal abdominal aorta and its associated bifurcated graft are widely patent.  Right Lower Extremity:  The femoral anastomosis in the right groin is to the profundofemoral artery and is widely patent the superficial femoral artery is  occluded.  He reconstitutes within the adductor canal and is mild to moderately diseased through the popliteal artery.  No significant stenosis is identified.  2 vessel runoff via the posterior tibial and peroneal artery are observed.  Left Lower Extremity:  The femoral anastomosis of the left groin is to the profundofemoral artery and is widely patent.  There is occlusion of superficial femoral artery with reconstitution within the adductor canal to a mild to moderately diseased artery with no significant stenosis identified.  The popliteal artery remains similar in caliber.  The anterior tibial artery is occluded.  There is two-vessel runoff to the ankle through the peroneal and posterior tibial artery.  The posterior tibial artery occludes in the peroneal artery reconstitutes the posterior tibial onto the foot.  Intervention:  None  Impression:  #1  patent aorta bifemoral bypass graft   #2  bilateral superficial femoral artery occlusion with reconstitution of the above-knee popliteal artery  #3  two-vessel runoff bilaterally.  The dominant vessel left is a peroneal.    Theotis Burrow, M.D. [facility] [place] Office: [phone] Pager:  [phone]

## 2015-07-26 NOTE — Pre-Procedure Instructions (Signed)
[name] [name]  07/26/2015      [place], [place] [address] Phone: [phone] Fax: [phone]  [id number] [place], [place] - [address] [place] [address] Phone: [phone] Fax: [phone]  [id number] [place], [place] [address] [place] [address] Phone: [phone] Fax: [phone]    Your procedure is scheduled on Friday, July 28, 2015.  Report to [facility] Admitting at 6:30 A.M.  Call this number if you have problems the morning of surgery:  [phone]   Remember:  Do not eat food or drink liquids after midnight Thursday, July 27, 2015  Take these medicines the morning of surgery with A SIP OF WATER :gabapentin (NEURONTIN),  oxyCODONE (ROXICODONE), phenytoin (DILANTIN), PATADAY eye drops if needed:promethazine (PHENERGAN) for nausea or vomiting, nitroGLYCERIN (NITROSTAT) for chest pain, esomeprazole (NEXIUM) for acid reflux, albuterol (PROVENTIL HFA;VENTOLIN HFA) 108 (90 BASE) MCG/ACT inhaler for wheezing or shortness of breath ( bring inhaler in with you on day of procedure). DO NOT take oral diabetic medications the morning of surgery such as glipiZIDE (GLUCOTROL).  Stop taking vitamins and herbal medications . Do not take any NSAIDs ie: Ibuprofen, Advil, Naproxen, BC and Goody Headache powder; stop now.  How to Manage Your Diabetes Before Surgery Why is it important to control my blood sugar before and after surgery?   Improving blood sugar levels before and after surgery helps healing and can limit problems.  A way of improving blood sugar control is eating a healthy diet by:  - Eating less sugar and carbohydrates  - Increasing activity/exercise  - Talk with your doctor about reaching your blood sugar goals  High blood sugars (greater than 180  mg/dL) can raise your risk of infections and slow down your recovery so you will need to focus on controlling your diabetes during the weeks before surgery.  Make sure that the doctor who takes care of your diabetes knows about your planned surgery including the date and location.  How do I manage my blood sugars before surgery?   Check your blood sugar at least 4 times a day, 2 days before surgery to make sure that they are not too high or low.   Check your blood sugar the morning of your surgery when you wake up and every 2               hours until you get to the Short-Stay unit.  If your blood sugar is less than 70 mg/dL, you will need to treat for low blood sugar by:  Treat a low blood sugar (less than 70 mg/dL) with 1/2 cup of clear juice (cranberry or apple), 4 glucose tablets, OR glucose gel.  Recheck blood sugar in 15 minutes after treatment (to make sure it is greater than 70 mg/dL).  If blood sugar is not greater than 70 mg/dL on re-check, call [phone] for further instructions.   Report your blood sugar to the Short-Stay nurse when you get to Short-Stay.  References:  [facility], 2007 "How to Manage your Diabetes Before and After Surgery".    Do not wear jewelry, make-up or nail polish.  Do not wear lotions, powders, or perfumes.  You may not wear deodorant.  Do not shave 48 hours prior to surgery.  Men may shave  face and neck.  Do not bring valuables to the hospital.  [facility] is not responsible for any belongings or valuables.  Contacts, dentures or bridgework may not be worn into surgery.  Leave your suitcase in the car.  After surgery it may be brought to your room.  For patients admitted to the hospital, discharge time will be determined by your treatment team.  Patients discharged the day of surgery will not be allowed to drive home.   Name and phone number of your driver:   Special instructions: Shower the night before surgery  and the morning of surgery with CHG.  Please read over the following fact sheets that you were given. Pain Booklet, Coughing and Deep Breathing, Blood Transfusion Information, MRSA Information and Surgical Site Infection Prevention

## 2015-07-27 ENCOUNTER — Encounter (HOSPITAL_COMMUNITY): Payer: Self-pay | Admitting: Surgery

## 2015-07-27 LAB — HEMOGLOBIN A1C
Hgb A1c MFr Bld: 7.4 % — ABNORMAL HIGH (ref 4.8–5.6)
Mean Plasma Glucose: 166 mg/dL

## 2015-07-27 MED FILL — Morphine Sulfate Inj 4 MG/ML: INTRAMUSCULAR | Qty: 1 | Status: AC

## 2015-07-27 NOTE — Anesthesia Preprocedure Evaluation (Addendum)
Anesthesia Evaluation  Patient identified by MRN, date of birth, ID band Patient awake    Reviewed: Allergy & Precautions, H&P , NPO status , Patient's Chart, lab work & pertinent test results  History of Anesthesia Complications Negative for: history of anesthetic complications  Airway Mallampati: II  TM Distance: >3 FB Neck ROM: Full    Dental  (+) Poor Dentition, Dental Advisory Given,    Pulmonary shortness of breath, Current Smoker,  breath sounds clear to auscultation  Pulmonary exam normal       Cardiovascular hypertension, + CAD, + Past MI and + Peripheral Vascular Disease Normal cardiovascular examRhythm:Regular Rate:Normal - Systolic murmurs Echo 1610 Study Conclusions  - Left ventricle: The cavity size was normal. Systolic function was normal. The estimated ejection fraction was in the range of 55% to 60%. Wall motion was normal; therewere no regional wall motion abnormalities. - Atrial septum: No defect or patent foramen ovale wasidentified.   Cath 11/2013 Final Conclusions:  1. Widely patent RCA stent with no evidence of restenosis 2. Patent left main and LAD with minor nonobstructive disease in the mid LAD unchanged from the previous study 3. 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 Normal LV function  Recommendations: The patient has stable coronary anatomy with preserved LV function. His stress test primarily showed infarct with only mild peri-infarct ischemia. He is not having typical angina with exertion. With wide patency of his RCA stent site, I would recommend continued medical therapy. He has not been a candidate for a beta blocker because of bradycardia. His LV filling pressures are low and I don't think he'll tolerate a long-acting nitrate. Continue current medications. He was reassured about the procedural findings.      Neuro/Psych Seizures -, Well Controlled,  PSYCHIATRIC DISORDERS    GI/Hepatic negative GI ROS, Neg liver ROS,   Endo/Other  diabetes, Oral Hypoglycemic Agents  Renal/GU Renal disease     Musculoskeletal  (+) Arthritis -,   Abdominal   Peds  Hematology   Anesthesia Other Findings   Reproductive/Obstetrics                       Anesthesia Physical  Anesthesia Plan  ASA: III  Anesthesia Plan: General   Post-op Pain Management:    Induction: Intravenous  Airway Management Planned: Oral ETT  Additional Equipment:   Intra-op Plan:   Post-operative Plan: Possible Post-op intubation/ventilation  Informed Consent: I have reviewed the patients History and Physical, chart, labs and discussed the procedure including the risks, benefits and alternatives for the proposed anesthesia with the patient or authorized representative who has indicated his/her understanding and acceptance.   Dental advisory given  Plan Discussed with: CRNA  Anesthesia Plan Comments: (2 x PIV, +/- aline)     Anesthesia Quick Evaluation

## 2015-07-27 NOTE — Progress Notes (Signed)
Anesthesia Chart Review: Patient is a 64 year old male scheduled for left FPBG on 07/28/15 by Dr. Donnetta Hutching. He has known PVD and DM with limb threatening ischemia and extensive tissue loss on the dorsum of his left foot. He is at risk for limb loss without revascularization.   History includes smoking, HLD, HTN, CAD/NSTEMI 05/2013 s/p Promus DES RCA and chronic occlusion of LCX/OM1 branch (treated medically), ischemic CM, SOB, DM2, seizure disorder, pituitary tumor, PVD s/p AFBG 11/25/11. PCP is listed as Dr. Lysle Pearl.  Cardiologist is Dr. Sherren Mocha, last visit 10/16/14. No angina at that time. He was not interested in smoking cessation. He is intolerant to Zetia and multiple statin agents, so he was not interested in trying other lipid lowering therapies. One year follow-up recommended. Of note, records from cardiology PA, Richardson Dopp indicate that patient is also intolerant to ACE inhibitors and ARBs and couldn't take b-blocker therapy due to bradycardia.   Meds include albuterol, Augmentin, Nexium, Neurontin, glipizide, Nitro, Dilantin, Ambien.  01/17/15 EKG: NSR.  11/29/13 Nuclear stress test: Overall Impression: Intermediate risk Small inferior and lateral wall infarct at mid and basal level with mild peri infarct ischemia. LV Ejection Fraction: 41%. LV Wall Motion: Diffuse hypokinesis worse in the apex and inferior base. Based on results, Dr. Burt Knack recommended LHC (see below).  12/10/13 LHC (Dr. Sherren Mocha): Final Conclusions:  1. Widely patent RCA stent with no evidence of restenosis 2. Patent left main and LAD with minor nonobstructive disease in the mid LAD unchanged from the previous study 3. 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 Normal LV function Recommendations: The patient has stable coronary anatomy with preserved LV function. His stress test primarily showed infarct with only mild peri-infarct  ischemia. He is not having typical angina with exertion. With wide patency of his RCA stent site, I would recommend continued medical therapy. He has not been a candidate for a beta blocker because of bradycardia. His LV filling pressures are low and I don't think he'll tolerate a long-acting nitrate. Continue current medications. He was reassured about the procedural findings.  08/18/13 Echo: Study Conclusions - Left ventricle: The cavity size was normal. Systolic function was normal. The estimated ejection fraction was in the range of 55% to 60%. Wall motion was normal; there were no regional wall motion abnormalities. - Atrial septum: No defect or patent foramen ovale was identified.  01/17/15 CXR: IMPRESSION: No active cardiopulmonary disease.  Preoperative labs noted. A1C 7.4.   Above reviewed with anesthesiologist Dr. Ermalene Postin. Patient with cardiology follow-up within the past year and functional testing within the past two years. He denied CP and SOB when evaluated by PAT RN. His medical therapy has been limited due to bradycardia and multiple drug intolerances. It would be ideal if he weren't smoking, but unfortunately he has not had any desire to quit. Dr. Luther Parody note indicates this is a limb threatening situation. If no new CV symptoms or other acute changes it is anticipated that he can proceed as planned.  George Hugh [facility] Short Stay Center/Anesthesiology Phone [phone] 07/27/2015 1:19 PM

## 2015-07-28 ENCOUNTER — Inpatient Hospital Stay (HOSPITAL_COMMUNITY): Payer: Medicaid Other | Admitting: Anesthesiology

## 2015-07-28 ENCOUNTER — Inpatient Hospital Stay (HOSPITAL_COMMUNITY)
Admission: RE | Admit: 2015-07-28 | Discharge: 2015-07-30 | DRG: 252 | Disposition: A | Discharge status: Home / Self Care | Payer: Medicaid Other | Source: Ambulatory Visit | Attending: Vascular Surgery | Admitting: Vascular Surgery

## 2015-07-28 ENCOUNTER — Encounter (HOSPITAL_COMMUNITY): Payer: Self-pay | Admitting: *Deleted

## 2015-07-28 ENCOUNTER — Telehealth: Payer: Self-pay | Admitting: Vascular Surgery

## 2015-07-28 ENCOUNTER — Inpatient Hospital Stay (HOSPITAL_COMMUNITY): Payer: Medicaid Other | Admitting: Vascular Surgery

## 2015-07-28 ENCOUNTER — Encounter (HOSPITAL_COMMUNITY)
Admission: RE | Disposition: A | Discharge status: Home / Self Care | Payer: Self-pay | Source: Ambulatory Visit | Attending: Vascular Surgery

## 2015-07-28 DIAGNOSIS — E1140 Type 2 diabetes mellitus with diabetic neuropathy, unspecified: Secondary | ICD-10-CM | POA: Diagnosis present

## 2015-07-28 DIAGNOSIS — M1990 Unspecified osteoarthritis, unspecified site: Secondary | ICD-10-CM | POA: Diagnosis present

## 2015-07-28 DIAGNOSIS — E785 Hyperlipidemia, unspecified: Secondary | ICD-10-CM | POA: Diagnosis present

## 2015-07-28 DIAGNOSIS — E1152 Type 2 diabetes mellitus with diabetic peripheral angiopathy with gangrene: Principal | ICD-10-CM | POA: Diagnosis present

## 2015-07-28 DIAGNOSIS — I96 Gangrene, not elsewhere classified: Secondary | ICD-10-CM | POA: Diagnosis not present

## 2015-07-28 DIAGNOSIS — Z888 Allergy status to other drugs, medicaments and biological substances status: Secondary | ICD-10-CM | POA: Diagnosis not present

## 2015-07-28 DIAGNOSIS — I2510 Atherosclerotic heart disease of native coronary artery without angina pectoris: Secondary | ICD-10-CM | POA: Diagnosis present

## 2015-07-28 DIAGNOSIS — Z880 Allergy status to penicillin: Secondary | ICD-10-CM

## 2015-07-28 DIAGNOSIS — E43 Unspecified severe protein-calorie malnutrition: Secondary | ICD-10-CM | POA: Insufficient documentation

## 2015-07-28 DIAGNOSIS — I10 Essential (primary) hypertension: Secondary | ICD-10-CM | POA: Diagnosis present

## 2015-07-28 DIAGNOSIS — L97529 Non-pressure chronic ulcer of other part of left foot with unspecified severity: Secondary | ICD-10-CM | POA: Diagnosis present

## 2015-07-28 DIAGNOSIS — Z681 Body mass index (BMI) 19 or less, adult: Secondary | ICD-10-CM | POA: Diagnosis not present

## 2015-07-28 DIAGNOSIS — L97409 Non-pressure chronic ulcer of unspecified heel and midfoot with unspecified severity: Secondary | ICD-10-CM | POA: Diagnosis present

## 2015-07-28 DIAGNOSIS — I252 Old myocardial infarction: Secondary | ICD-10-CM | POA: Diagnosis not present

## 2015-07-28 DIAGNOSIS — F17210 Nicotine dependence, cigarettes, uncomplicated: Secondary | ICD-10-CM | POA: Diagnosis present

## 2015-07-28 HISTORY — PX: FEMORAL-POPLITEAL BYPASS GRAFT: SHX937

## 2015-07-28 LAB — GLUCOSE, CAPILLARY
GLUCOSE-CAPILLARY: 134 mg/dL — AB (ref 65–99)
Glucose-Capillary: 148 mg/dL — ABNORMAL HIGH (ref 65–99)
Glucose-Capillary: 125 mg/dL — ABNORMAL HIGH (ref 65–99)
Glucose-Capillary: 107 mg/dL — ABNORMAL HIGH (ref 65–99)
Glucose-Capillary: 97 mg/dL (ref 65–99)

## 2015-07-28 LAB — CREATININE, SERUM: Creatinine, Ser: 0.69 mg/dL (ref 0.61–1.24)

## 2015-07-28 LAB — CBC
HEMATOCRIT: 37.3 % — AB (ref 39.0–52.0)
HEMOGLOBIN: 12.4 g/dL — AB (ref 13.0–17.0)
MCH: 30.4 pg (ref 26.0–34.0)
MCHC: 33.2 g/dL (ref 30.0–36.0)
MCV: 91.4 fL (ref 78.0–100.0)
Platelets: UNDETERMINED 10*3/uL (ref 150–400)
RBC: 4.08 MIL/uL — ABNORMAL LOW (ref 4.22–5.81)
RDW: 13.1 % (ref 11.5–15.5)
WBC: 7.6 10*3/uL (ref 4.0–10.5)

## 2015-07-28 SURGERY — BYPASS GRAFT FEMORAL-POPLITEAL ARTERY
Anesthesia: General | Site: Leg Upper | Laterality: Left

## 2015-07-28 MED ORDER — ONDANSETRON HCL 4 MG/2ML IJ SOLN
4.0000 mg | Freq: Four times a day (QID) | INTRAMUSCULAR | Status: DC | PRN
Start: 2015-07-28 — End: 2015-07-30

## 2015-07-28 MED ORDER — FENTANYL CITRATE (PF) 250 MCG/5ML IJ SOLN
INTRAMUSCULAR | Status: AC
  Filled 2015-07-28: qty 5

## 2015-07-28 MED ORDER — ONDANSETRON HCL 4 MG/2ML IJ SOLN
INTRAMUSCULAR | Status: AC
  Filled 2015-07-28: qty 2

## 2015-07-28 MED ORDER — VANCOMYCIN HCL IN DEXTROSE 1-5 GM/200ML-% IV SOLN
1000.0000 mg | Freq: Two times a day (BID) | INTRAVENOUS | Status: DC
  Administered 2015-07-28 – 2015-07-30 (×4): 1000 mg via INTRAVENOUS
  Filled 2015-07-28 (×7): qty 200

## 2015-07-28 MED ORDER — MIDAZOLAM HCL 2 MG/2ML IJ SOLN
INTRAMUSCULAR | Status: AC
Start: 2015-07-28 — End: 2015-07-28
  Filled 2015-07-28: qty 4

## 2015-07-28 MED ORDER — LABETALOL HCL 5 MG/ML IV SOLN: 10.0000 mg | INTRAVENOUS | Status: DC | PRN

## 2015-07-28 MED ORDER — PHENOL 1.4 % MT LIQD: 1.0000 | OROMUCOSAL | Status: DC | PRN

## 2015-07-28 MED ORDER — PHENYTOIN SODIUM EXTENDED 100 MG PO CAPS: 200.0000 mg | ORAL_CAPSULE | Freq: Two times a day (BID) | ORAL | Status: DC

## 2015-07-28 MED ORDER — ACETAMINOPHEN 650 MG RE SUPP: 325.0000 mg | RECTAL | Status: DC | PRN

## 2015-07-28 MED ORDER — MORPHINE SULFATE (PF) 2 MG/ML IV SOLN
INTRAVENOUS | Status: AC
  Filled 2015-07-28: qty 1

## 2015-07-28 MED ORDER — GLIPIZIDE 10 MG PO TABS
10.0000 mg | ORAL_TABLET | Freq: Two times a day (BID) | ORAL | Status: DC
  Administered 2015-07-28 – 2015-07-30 (×4): 10 mg via ORAL
  Filled 2015-07-28 (×6): qty 1

## 2015-07-28 MED ORDER — SODIUM CHLORIDE 0.9 % IV SOLN: INTRAVENOUS | Status: DC

## 2015-07-28 MED ORDER — POTASSIUM CHLORIDE CRYS ER 20 MEQ PO TBCR
20.0000 meq | EXTENDED_RELEASE_TABLET | Freq: Every day | ORAL | Status: AC | PRN
  Administered 2015-07-29: 20 meq via ORAL
  Filled 2015-07-28: qty 1

## 2015-07-28 MED ORDER — FENTANYL CITRATE (PF) 250 MCG/5ML IJ SOLN
INTRAMUSCULAR | Status: AC
Start: 2015-07-28 — End: 2015-07-28
  Filled 2015-07-28: qty 5

## 2015-07-28 MED ORDER — OXYCODONE HCL 5 MG PO TABS
ORAL_TABLET | ORAL | Status: AC
  Filled 2015-07-28: qty 9

## 2015-07-28 MED ORDER — LACTATED RINGERS IV SOLN
INTRAVENOUS | Status: DC | PRN
  Administered 2015-07-28: 10:00:00 via INTRAVENOUS

## 2015-07-28 MED ORDER — NEOSTIGMINE METHYLSULFATE 10 MG/10ML IV SOLN
INTRAVENOUS | Status: DC | PRN
  Administered 2015-07-28: 3 mg via INTRAVENOUS

## 2015-07-28 MED ORDER — PROMETHAZINE HCL 25 MG/ML IJ SOLN
INTRAMUSCULAR | Status: AC
  Filled 2015-07-28: qty 1

## 2015-07-28 MED ORDER — ENOXAPARIN SODIUM 30 MG/0.3ML ~~LOC~~ SOLN
30.0000 mg | SUBCUTANEOUS | Status: DC
  Administered 2015-07-29: 30 mg via SUBCUTANEOUS
  Filled 2015-07-28 (×2): qty 0.3

## 2015-07-28 MED ORDER — ROCURONIUM BROMIDE 100 MG/10ML IV SOLN
INTRAVENOUS | Status: DC | PRN
  Administered 2015-07-28: 50 mg via INTRAVENOUS

## 2015-07-28 MED ORDER — ALUM & MAG HYDROXIDE-SIMETH 200-200-20 MG/5ML PO SUSP: 15.0000 mL | ORAL | Status: DC | PRN

## 2015-07-28 MED ORDER — LIDOCAINE HCL (CARDIAC) 20 MG/ML IV SOLN
INTRAVENOUS | Status: DC | PRN
  Administered 2015-07-28: 80 mg via INTRAVENOUS

## 2015-07-28 MED ORDER — PROPOFOL 10 MG/ML IV BOLUS
INTRAVENOUS | Status: DC | PRN
  Administered 2015-07-28: 160 mg via INTRAVENOUS
  Administered 2015-07-28: 20 mg via INTRAVENOUS

## 2015-07-28 MED ORDER — OXYCODONE HCL 5 MG PO TABS
45.0000 mg | ORAL_TABLET | Freq: Four times a day (QID) | ORAL | Status: DC
  Administered 2015-07-28 – 2015-07-30 (×9): 45 mg via ORAL
  Filled 2015-07-28 (×8): qty 9

## 2015-07-28 MED ORDER — VANCOMYCIN HCL IN DEXTROSE 1-5 GM/200ML-% IV SOLN
1000.0000 mg | INTRAVENOUS | Status: AC
  Administered 2015-07-28: 1000 mg via INTRAVENOUS
  Filled 2015-07-28: qty 200

## 2015-07-28 MED ORDER — NEOSTIGMINE METHYLSULFATE 10 MG/10ML IV SOLN
INTRAVENOUS | Status: AC
  Filled 2015-07-28: qty 1

## 2015-07-28 MED ORDER — HYDROMORPHONE HCL 1 MG/ML IJ SOLN
INTRAMUSCULAR | Status: AC
  Filled 2015-07-28: qty 1

## 2015-07-28 MED ORDER — MEPERIDINE HCL 25 MG/ML IJ SOLN: 6.2500 mg | INTRAMUSCULAR | Status: DC | PRN

## 2015-07-28 MED ORDER — KETOCONAZOLE 2 % EX CREA: 1.0000 "application " | TOPICAL_CREAM | Freq: Every day | CUTANEOUS | Status: DC | PRN

## 2015-07-28 MED ORDER — GABAPENTIN 300 MG PO CAPS
300.0000 mg | ORAL_CAPSULE | Freq: Three times a day (TID) | ORAL | Status: DC
  Administered 2015-07-29: 300 mg via ORAL
  Filled 2015-07-28 (×7): qty 1

## 2015-07-28 MED ORDER — 0.9 % SODIUM CHLORIDE (POUR BTL) OPTIME
TOPICAL | Status: DC | PRN
  Administered 2015-07-28: 2000 mL

## 2015-07-28 MED ORDER — PANTOPRAZOLE SODIUM 40 MG PO TBEC
40.0000 mg | DELAYED_RELEASE_TABLET | Freq: Every day | ORAL | Status: DC
Start: 2015-07-28 — End: 2015-07-28

## 2015-07-28 MED ORDER — INSULIN ASPART 100 UNIT/ML ~~LOC~~ SOLN: 0.0000 [IU] | Freq: Three times a day (TID) | SUBCUTANEOUS | Status: DC

## 2015-07-28 MED ORDER — HYDROMORPHONE HCL 1 MG/ML IJ SOLN
0.2500 mg | INTRAMUSCULAR | Status: DC | PRN
  Administered 2015-07-28 (×4): 0.5 mg via INTRAVENOUS

## 2015-07-28 MED ORDER — ALBUTEROL SULFATE (2.5 MG/3ML) 0.083% IN NEBU: 2.5000 mg | INHALATION_SOLUTION | Freq: Four times a day (QID) | RESPIRATORY_TRACT | Status: DC | PRN

## 2015-07-28 MED ORDER — BISACODYL 10 MG RE SUPP: 10.0000 mg | Freq: Every day | RECTAL | Status: DC | PRN

## 2015-07-28 MED ORDER — ENSURE ENLIVE PO LIQD
237.0000 mL | Freq: Two times a day (BID) | ORAL | Status: DC
  Administered 2015-07-29: 237 mL via ORAL

## 2015-07-28 MED ORDER — GUAIFENESIN-DM 100-10 MG/5ML PO SYRP: 15.0000 mL | ORAL_SOLUTION | ORAL | Status: DC | PRN

## 2015-07-28 MED ORDER — FENTANYL CITRATE (PF) 100 MCG/2ML IJ SOLN
50.0000 ug | Freq: Once | INTRAMUSCULAR | Status: AC
  Administered 2015-07-28: 50 ug via INTRAVENOUS

## 2015-07-28 MED ORDER — TRIAMCINOLONE ACETONIDE 0.1 % EX CREA: 1.0000 "application " | TOPICAL_CREAM | Freq: Two times a day (BID) | CUTANEOUS | Status: DC | PRN

## 2015-07-28 MED ORDER — MAGNESIUM HYDROXIDE 400 MG/5ML PO SUSP: 30.0000 mL | Freq: Every day | ORAL | Status: DC | PRN

## 2015-07-28 MED ORDER — FENTANYL CITRATE (PF) 100 MCG/2ML IJ SOLN
INTRAMUSCULAR | Status: DC | PRN
  Administered 2015-07-28: 50 ug via INTRAVENOUS
  Administered 2015-07-28: 100 ug via INTRAVENOUS
  Administered 2015-07-28 (×7): 50 ug via INTRAVENOUS

## 2015-07-28 MED ORDER — SILVER SULFADIAZINE 1 % EX CREA: 1.0000 "application " | TOPICAL_CREAM | Freq: Every day | CUTANEOUS | Status: DC

## 2015-07-28 MED ORDER — PHENYTOIN SODIUM EXTENDED 100 MG PO CAPS
200.0000 mg | ORAL_CAPSULE | Freq: Every day | ORAL | Status: DC
  Administered 2015-07-29 – 2015-07-30 (×2): 200 mg via ORAL
  Filled 2015-07-28 (×2): qty 2

## 2015-07-28 MED ORDER — SODIUM CHLORIDE 0.9 % IR SOLN
Status: DC | PRN
  Administered 2015-07-28: 500 mL

## 2015-07-28 MED ORDER — LACTATED RINGERS IV SOLN
INTRAVENOUS | Status: DC
  Administered 2015-07-28 (×3): via INTRAVENOUS

## 2015-07-28 MED ORDER — SODIUM CHLORIDE 0.9 % IV SOLN
INTRAVENOUS | Status: DC
  Administered 2015-07-28: 17:00:00 via INTRAVENOUS

## 2015-07-28 MED ORDER — MORPHINE SULFATE (PF) 2 MG/ML IV SOLN
2.0000 mg | INTRAVENOUS | Status: DC | PRN
  Administered 2015-07-28 – 2015-07-29 (×4): 2 mg via INTRAVENOUS
  Administered 2015-07-29 (×3): 3 mg via INTRAVENOUS
  Administered 2015-07-29: 2 mg via INTRAVENOUS
  Administered 2015-07-29: 3 mg via INTRAVENOUS
  Administered 2015-07-29: 2 mg via INTRAVENOUS
  Administered 2015-07-30: 3 mg via INTRAVENOUS
  Administered 2015-07-30: 2 mg via INTRAVENOUS
  Filled 2015-07-28 (×3): qty 1
  Filled 2015-07-28: qty 2
  Filled 2015-07-28: qty 1
  Filled 2015-07-28: qty 2
  Filled 2015-07-28: qty 1
  Filled 2015-07-28: qty 2
  Filled 2015-07-28: qty 1
  Filled 2015-07-28 (×3): qty 2

## 2015-07-28 MED ORDER — MIDAZOLAM HCL 5 MG/5ML IJ SOLN
INTRAMUSCULAR | Status: DC | PRN
  Administered 2015-07-28: 2 mg via INTRAVENOUS

## 2015-07-28 MED ORDER — PROTAMINE SULFATE 10 MG/ML IV SOLN
INTRAVENOUS | Status: AC
  Filled 2015-07-28: qty 5

## 2015-07-28 MED ORDER — FENTANYL CITRATE (PF) 100 MCG/2ML IJ SOLN
INTRAMUSCULAR | Status: AC
  Administered 2015-07-28: 50 ug via INTRAVENOUS
  Filled 2015-07-28: qty 2

## 2015-07-28 MED ORDER — PHENYTOIN SODIUM EXTENDED 100 MG PO CAPS
300.0000 mg | ORAL_CAPSULE | Freq: Every day | ORAL | Status: DC
  Administered 2015-07-28 – 2015-07-29 (×2): 300 mg via ORAL
  Filled 2015-07-28 (×3): qty 3

## 2015-07-28 MED ORDER — HEPARIN SODIUM (PORCINE) 1000 UNIT/ML IJ SOLN
INTRAMUSCULAR | Status: AC
Start: 2015-07-28 — End: 2015-07-28
  Filled 2015-07-28: qty 1

## 2015-07-28 MED ORDER — EPHEDRINE SULFATE 50 MG/ML IJ SOLN
INTRAMUSCULAR | Status: DC | PRN
  Administered 2015-07-28 (×2): 10 mg via INTRAVENOUS

## 2015-07-28 MED ORDER — ZOLPIDEM TARTRATE 5 MG PO TABS: 10.0000 mg | ORAL_TABLET | Freq: Every evening | ORAL | Status: DC | PRN

## 2015-07-28 MED ORDER — PROTAMINE SULFATE 10 MG/ML IV SOLN
INTRAVENOUS | Status: DC | PRN
  Administered 2015-07-28 (×5): 10 mg via INTRAVENOUS

## 2015-07-28 MED ORDER — HEPARIN SODIUM (PORCINE) 1000 UNIT/ML IJ SOLN
INTRAMUSCULAR | Status: DC | PRN
Start: 2015-07-28 — End: 2015-07-28
  Administered 2015-07-28: 7000 [IU] via INTRAVENOUS

## 2015-07-28 MED ORDER — HYDRALAZINE HCL 20 MG/ML IJ SOLN
5.0000 mg | INTRAMUSCULAR | Status: DC | PRN
Start: 2015-07-28 — End: 2015-07-30

## 2015-07-28 MED ORDER — ONDANSETRON HCL 4 MG/2ML IJ SOLN
INTRAMUSCULAR | Status: DC | PRN
  Administered 2015-07-28: 4 mg via INTRAVENOUS

## 2015-07-28 MED ORDER — SODIUM CHLORIDE 0.9 % IV SOLN: 500.0000 mL | Freq: Once | INTRAVENOUS | Status: DC | PRN

## 2015-07-28 MED ORDER — GLYCOPYRROLATE 0.2 MG/ML IJ SOLN
INTRAMUSCULAR | Status: DC | PRN
  Administered 2015-07-28: 0.4 mg via INTRAVENOUS

## 2015-07-28 MED ORDER — ACETAMINOPHEN 325 MG PO TABS: 325.0000 mg | ORAL_TABLET | ORAL | Status: DC | PRN

## 2015-07-28 MED ORDER — ADULT MULTIVITAMIN W/MINERALS CH
1.0000 | ORAL_TABLET | Freq: Every day | ORAL | Status: DC
  Administered 2015-07-28 – 2015-07-30 (×3): 1 via ORAL
  Filled 2015-07-28 (×3): qty 1

## 2015-07-28 MED ORDER — METOPROLOL TARTRATE 1 MG/ML IV SOLN: 2.0000 mg | INTRAVENOUS | Status: DC | PRN

## 2015-07-28 MED ORDER — GLYCOPYRROLATE 0.2 MG/ML IJ SOLN
INTRAMUSCULAR | Status: AC
  Filled 2015-07-28: qty 2

## 2015-07-28 MED ORDER — DOCUSATE SODIUM 100 MG PO CAPS
100.0000 mg | ORAL_CAPSULE | Freq: Every day | ORAL | Status: DC
  Administered 2015-07-29: 100 mg via ORAL
  Filled 2015-07-28 (×2): qty 1

## 2015-07-28 MED ORDER — PROMETHAZINE HCL 25 MG/ML IJ SOLN
6.2500 mg | INTRAMUSCULAR | Status: DC | PRN
  Administered 2015-07-28: 6.25 mg via INTRAVENOUS

## 2015-07-28 MED ORDER — OLOPATADINE HCL 0.1 % OP SOLN: 1.0000 [drp] | Freq: Two times a day (BID) | OPHTHALMIC | Status: DC

## 2015-07-28 MED ORDER — NITROGLYCERIN 0.4 MG SL SUBL: 0.4000 mg | SUBLINGUAL_TABLET | SUBLINGUAL | Status: DC | PRN

## 2015-07-28 SURGICAL SUPPLY — 63 items
BANDAGE ESMARK 6X9 LF (GAUZE/BANDAGES/DRESSINGS) IMPLANT
BENZOIN TINCTURE PRP APPL 2/3 (GAUZE/BANDAGES/DRESSINGS) ×3 IMPLANT
BNDG ESMARK 6X9 LF (GAUZE/BANDAGES/DRESSINGS)
CANISTER SUCTION 2500CC (MISCELLANEOUS) ×3 IMPLANT
CANNULA VESSEL 3MM 2 BLNT TIP (CANNULA) ×6 IMPLANT
CATH EMB 3FR 80CM (CATHETERS) ×3 IMPLANT
CLIP LIGATING EXTRA MED SLVR (CLIP) ×3 IMPLANT
CLIP LIGATING EXTRA SM BLUE (MISCELLANEOUS) ×6 IMPLANT
CLOSURE WOUND 1/2 X4 (GAUZE/BANDAGES/DRESSINGS) ×1
COVER PROBE W GEL 5X96 (DRAPES) ×3 IMPLANT
CUFF TOURNIQUET SINGLE 34IN LL (TOURNIQUET CUFF) IMPLANT
CUFF TOURNIQUET SINGLE 44IN (TOURNIQUET CUFF) IMPLANT
DRAIN SNY 10X20 3/4 PERF (WOUND CARE) IMPLANT
DRAPE PROXIMA HALF (DRAPES) ×3 IMPLANT
DRAPE X-RAY CASS 24X20 (DRAPES) IMPLANT
DRSG COVADERM 4X10 (GAUZE/BANDAGES/DRESSINGS) ×3 IMPLANT
DRSG COVADERM 4X14 (GAUZE/BANDAGES/DRESSINGS) ×3 IMPLANT
DRSG COVADERM 4X8 (GAUZE/BANDAGES/DRESSINGS) ×3 IMPLANT
ELECT REM PT RETURN 9FT ADLT (ELECTROSURGICAL) ×3
ELECTRODE REM PT RTRN 9FT ADLT (ELECTROSURGICAL) ×1 IMPLANT
EVACUATOR SILICONE 100CC (DRAIN) IMPLANT
GAUZE SPONGE 4X4 12PLY STRL (GAUZE/BANDAGES/DRESSINGS) IMPLANT
GLOVE BIO SURGEON STRL SZ 6.5 (GLOVE) ×6 IMPLANT
GLOVE BIO SURGEON STRL SZ7.5 (GLOVE) ×3 IMPLANT
GLOVE BIO SURGEONS STRL SZ 6.5 (GLOVE) ×3
GLOVE BIOGEL PI IND STRL 6.5 (GLOVE) ×4 IMPLANT
GLOVE BIOGEL PI IND STRL 8 (GLOVE) ×1 IMPLANT
GLOVE BIOGEL PI INDICATOR 6.5 (GLOVE) ×8
GLOVE BIOGEL PI INDICATOR 8 (GLOVE) ×2
GLOVE ECLIPSE 6.5 STRL STRAW (GLOVE) ×9 IMPLANT
GLOVE SS BIOGEL STRL SZ 7.5 (GLOVE) ×1 IMPLANT
GLOVE SUPERSENSE BIOGEL SZ 7.5 (GLOVE) ×2
GLOVE SURG SS PI 7.0 STRL IVOR (GLOVE) ×3 IMPLANT
GOWN STRL REUS W/ TWL LRG LVL3 (GOWN DISPOSABLE) ×5 IMPLANT
GOWN STRL REUS W/ TWL XL LVL3 (GOWN DISPOSABLE) ×1 IMPLANT
GOWN STRL REUS W/TWL LRG LVL3 (GOWN DISPOSABLE) ×10
GOWN STRL REUS W/TWL XL LVL3 (GOWN DISPOSABLE) ×2
INSERT FOGARTY SM (MISCELLANEOUS) IMPLANT
KIT BASIN OR (CUSTOM PROCEDURE TRAY) ×3 IMPLANT
KIT ROOM TURNOVER OR (KITS) ×3 IMPLANT
NS IRRIG 1000ML POUR BTL (IV SOLUTION) ×6 IMPLANT
PACK PERIPHERAL VASCULAR (CUSTOM PROCEDURE TRAY) ×3 IMPLANT
PAD ARMBOARD 7.5X6 YLW CONV (MISCELLANEOUS) ×6 IMPLANT
PADDING CAST COTTON 6X4 STRL (CAST SUPPLIES) IMPLANT
SET COLLECT BLD 21X3/4 12 (NEEDLE) IMPLANT
STAPLER VISISTAT 35W (STAPLE) IMPLANT
STOPCOCK 4 WAY LG BORE MALE ST (IV SETS) IMPLANT
STRIP CLOSURE SKIN 1/2X4 (GAUZE/BANDAGES/DRESSINGS) ×2 IMPLANT
SUT ETHILON 3 0 PS 1 (SUTURE) IMPLANT
SUT PROLENE 5 0 C 1 24 (SUTURE) ×3 IMPLANT
SUT PROLENE 6 0 CC (SUTURE) ×18 IMPLANT
SUT SILK 2 0 SH (SUTURE) ×3 IMPLANT
SUT SILK 4 0 (SUTURE) ×2
SUT SILK 4-0 18XBRD TIE 12 (SUTURE) ×1 IMPLANT
SUT VIC AB 2-0 CTX 36 (SUTURE) ×6 IMPLANT
SUT VIC AB 3-0 SH 27 (SUTURE) ×6
SUT VIC AB 3-0 SH 27X BRD (SUTURE) ×3 IMPLANT
SUT VICRYL 4-0 PS2 18IN ABS (SUTURE) ×6 IMPLANT
SYR 3ML LL SCALE MARK (SYRINGE) ×3 IMPLANT
TRAY FOLEY W/METER SILVER 16FR (SET/KITS/TRAYS/PACK) ×3 IMPLANT
TUBING EXTENTION W/L.L. (IV SETS) IMPLANT
UNDERPAD 30X30 INCONTINENT (UNDERPADS AND DIAPERS) ×3 IMPLANT
WATER STERILE IRR 1000ML POUR (IV SOLUTION) ×3 IMPLANT

## 2015-07-28 NOTE — Progress Notes (Signed)
  [facility] Day of Surgery Note  Subjective:  Patient seen in PACU. Having minimal pain but was recently medicated.   Filed Vitals:   07/28/15 1631  BP: 157/76  Pulse: 62  Temp:   Resp: 11   Left leg incisions dressed with minimal drainage on bandages Left foot with strong PT and DP doppler signals Ulceration of left dorsum of foot and ulceration of left great toe.   Assessment/Plan:  This is a 64 y.o. male who is s/p left femoropopliteal bypass  Stable post-op Bypass patent To 3S soon.   Virgina Jock, [place] Pager: [phone] 07/28/2015 4:36 PM

## 2015-07-28 NOTE — Progress Notes (Signed)
ANTIBIOTIC CONSULT NOTE - INITIAL  Pharmacy Consult for Vancomycin Indication: Surgical prophylaxis / foot infection  Allergies  Allergen Reactions  . Zetia [Ezetimibe] Anaphylaxis and Swelling    Tongue and throat   . Atorvastatin Other (See Comments)    Malaise & muscle weakness  . Penicillins Other (See Comments)    Unknown  . Pravastatin Sodium     Pravastatin 40 mg qday and 40 mg q M/W/F caused muscle aches    Patient Measurements: Weight: 145 lb (65.772 kg)  Vital Signs: Temp: 97.1 F (36.2 C) (09/02 1615) Temp Source: Oral (09/02 0808) BP: 157/76 mmHg (09/02 1631) Pulse Rate: 62 (09/02 1631) Intake/Output from previous day:   Intake/Output from this shift: Total I/O In: 2560 [P.O.:60; I.V.:2500] Out: 2225 [Urine:2075; Blood:150]  Labs:  Recent Labs  07/26/15 0858 07/26/15 1415  WBC  --  4.7  HGB 15.0 13.8  PLT  --  197  CREATININE 0.60* 0.64   Estimated Creatinine Clearance: 89.1 mL/min (by C-G formula based on Cr of 0.64). No results for input(s): VANCOTROUGH, VANCOPEAK, VANCORANDOM, GENTTROUGH, GENTPEAK, GENTRANDOM, TOBRATROUGH, TOBRAPEAK, TOBRARND, AMIKACINPEAK, AMIKACINTROU, AMIKACIN in the last 72 hours.   Microbiology: Recent Results (from the past 720 hour(s))  Surgical pcr screen     Status: None   Collection Time: 07/26/15  2:14 PM  Result Value Ref Range Status   MRSA, PCR NEGATIVE NEGATIVE Final   Staphylococcus aureus NEGATIVE NEGATIVE Final    Comment:        The Xpert SA Assay (FDA approved for NASAL specimens in patients over 63 years of age), is one component of a comprehensive surveillance program.  Test performance has been validated by [facility] for patients greater than or equal to 63 year old. It is not intended to diagnose infection nor to guide or monitor treatment.     Medical History: Past Medical History  Diagnosis Date  . Hyperlipidemia   . Hypertension   . Leg pain   . Peripheral vascular disease     s/p  prior Ao-Bifem BPG  . 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 Coronary artery disease     a. NSTEMI 7/14=> LHC 06/07/13: Proximal LAD 30%, ostial D1 30%, AV circumflex 80%, then occluded, ostial OM2 occluded, left to left collaterals, mid RCA 95%, inferior HK, EF 60%. PCI: Promus DES to the mid RCA.  No intervention was recommended for the CTO circumflex.  . Ischemic cardiomyopathy     a. Echocardiogram 06/06/13: EF 35-40%, inferior AK, mild MR.  Marland Kitchen Shortness of breath     "can come on at anytime lately; before that it was just w/exertion" (11/10/2013)  . Type II diabetes mellitus   . Grand mal seizure     "last one was ~ 3 yr ago; controlled w/daily RX" (11/10/2013)  . Arthritis     "neck, back, legs" (11/10/2013)  . Kidney stones     "when I was younger; passed w/o treatment" (11/10/2013)  . Myocardial infarction   . Diabetic neuropathy   . H. pylori infection   . Wears glasses    Assessment: 64 yo M presents on 8/30 for a follow up on his foot. Seems to have progressively gotten worse with a larger ulcer on the dorsum of his foot and has diffuse erythema. Now s/p L below-knee popliteal endarterectomy. Pharmacy consulted to start vancomycin for foot infection / surgical prophylaxis. Afebrile and WBC wnl. SCr stable,  CrCl ~74ml/min. Received 1g of vancomycin this morning at 1000.  Goal of Therapy:  Vancomycin trough level 10-15 mcg/ml  Resolution of infection  Plan:  Start vancomycin 1g IV Q12 tonight at 2200 Monitor clinical picture, renal function, VT prn F/U LOT    Lailynn Southgate J 07/28/2015,5:00 PM

## 2015-07-28 NOTE — Transfer of Care (Signed)
Immediate Anesthesia Transfer of Care Note  Patient: [name] [name]  Procedure(s) Performed: Procedure(s): LEFT FEMORAL-POPLITEAL ARTERY BYPASS GRAFT (Left)  Patient Location: PACU  Anesthesia Type:General  Level of Consciousness: awake, oriented and patient cooperative  Airway & Oxygen Therapy: Patient Spontanous Breathing and Patient connected to nasal cannula oxygen  Post-op Assessment: Report given to RN and Post -op Vital signs reviewed and stable  Post vital signs: Reviewed  Last Vitals:  Filed Vitals:   07/28/15 1445  BP:   Pulse: 99  Temp:     Complications: No apparent anesthesia complications

## 2015-07-28 NOTE — H&P (View-Only) (Signed)
Patient ID: [name] [name], male   DOB: 1950-12-12, 64 y.o.   MRN: [id number] Reviewed his arteriogram. Discussed need for left femoral to pop bypass. Has good appearing saphenous vein by physical exam. We'll discharge to home this afternoon and admit on 07/28/2015 for bypass

## 2015-07-28 NOTE — Anesthesia Postprocedure Evaluation (Signed)
Anesthesia Post Note  Patient: [name] [name]  Procedure(s) Performed: Procedure(s) (LRB): LEFT FEMORAL-POPLITEAL ARTERY BYPASS GRAFT (Left)  Anesthesia type: General  Patient location: PACU  Post pain: Pain level controlled  Post assessment: Post-op Vital signs reviewed  Last Vitals: BP 146/63 mmHg  Pulse 55  Temp(Src) 36.9 C (Oral)  Resp 16  Wt 145 lb (65.772 kg)  SpO2 100%  Post vital signs: Reviewed  Level of consciousness: sedated  Complications: No apparent anesthesia complications

## 2015-07-28 NOTE — Anesthesia Procedure Notes (Signed)
Procedure Name: Intubation Date/Time: 07/28/2015 10:39 AM Performed by: Jenne Campus Pre-anesthesia Checklist: Patient identified, Emergency Drugs available, Suction available, Patient being monitored and Timeout performed Patient Re-evaluated:Patient Re-evaluated prior to inductionOxygen Delivery Method: Circle system utilized Preoxygenation: Pre-oxygenation with 100% oxygen Intubation Type: IV induction Ventilation: Mask ventilation without difficulty and Oral airway inserted - appropriate to patient size Laryngoscope Size: Miller and 3 Grade View: Grade I Tube type: Oral Tube size: 7.5 mm Number of attempts: 1 Airway Equipment and Method: Stylet Placement Confirmation: ETT inserted through vocal cords under direct vision,  positive ETCO2,  CO2 detector and breath sounds checked- equal and bilateral Secured at: 22 cm Tube secured with: Tape Dental Injury: Teeth and Oropharynx as per pre-operative assessment

## 2015-07-28 NOTE — Op Note (Signed)
OPERATIVE REPORT  DATE OF SURGERY: 07/28/2015  PATIENT: [name] [name], 64 y.o. male MRN: [id number]  DOB: 12-09-1950  PRE-OPERATIVE DIAGNOSIS: Gangrenous changes of left foot with severe ischemia  POST-OPERATIVE DIAGNOSIS:  Same  PROCEDURE: Left femoral to below-knee popliteal bypass with reversed great saphenous vein, below-knee popliteal endarterectomy  SURGEON:  Curt Jews, M.D.  PHYSICIAN ASSISTANT: Dr. Gae Gallop, Leontine Locket PA-C  ANESTHESIA:  Gen.  EBL: 100 ml  Total I/O In: 2400 [I.V.:2400] Out: 1575 [Urine:1425; Blood:150]  BLOOD ADMINISTERED: None  DRAINS: None  SPECIMEN: None  COUNTS CORRECT:  YES  PLAN OF CARE: PACU   PATIENT DISPOSITION:  PACU - hemodynamically stable  PROCEDURE DETAILS: Patient was taken up and placed supine position where the area of the left groin left leg prepped in usual sterile fashion. Incision was made over the prior femoral incision carried down to isolate the left limb of his aortofemoral bypass. The saphenous vein was exposed the same incision. The saphenous vein was imaged with SonoSite ultrasound was good caliber. Multiple incisions were made throughout the thigh down to the level of the calf exposure of the saphenous vein. Tributary branches were tributary branches were ligated with 3 or 4-0 silk ties and divided. The below-knee popliteal artery was exposed the same vein harvest incision on the medial approach. It was of large caliber. Did have atherosclerotic change present. The vein was ligated distally and divided. The vein was occluded at the saphenofemoral junction with a Cooley clamp and the saphenous vein was excised from the saphenofemoral junction. The saphenofemoral junction was oversewn with 50 proline suture. The vein was gently dilated was of very good caliber throughout its course. A tunnel was created from the level of the below-knee popliteal artery to the groin. The patient was given 7000 units intravenous  heparin. After adequate circulation time the hood of the prior aortofemoral graft was occluded with a Cooley clamp. An ellipse of graft was removed. There was some mural thrombus present and this was removed. The vein was spatulated and sewn end-to-side to the old femoropopliteal graft with a running 60 proline suture. Anastomosis tested and found to be adequate. The vein was brought to the prior created tunnel down to the level of the below-knee popliteal artery. The below-knee popliteal artery was occluded proximally and distally was opened with 11 blade incision longitudinally with Potts scissors. The patient had extensive ectasia of the artery and subtotal occlusion. Appeared to represent a very small flow lumen this appeared thrombosed. The patient had an arteriogram 2 days earlier which indicated at this area was opened. Onto the anterior tibial and tibioperoneal trunk takeoff. These vessels were controlled and the arteriotomy was continued down onto the tibioperoneal trunk. There was backbleeding from this area. The below-knee popliteal artery was endarterectomized through this ectatic portion of subtotal occlusion. The tack the plaque was tacked proximally and distally with 60 proline sutures. The vein was cut to appropriate length and was spatulated and sewn as a patch angioplasty over the endarterectomy and on down onto the tibioperoneal trunk. Prior to completion of the closure the usual flushing maneuvers were undertaken. Anastomosis was completed and flow was restored to the prior graft. Excellent graft dependent Doppler flow is noted at the posterior tibial at the ankle. The patient was given 50 mg of protamine to reverse the heparin. Wounds irrigated with saline. Hemostasis tablet cautery. The wounds were closed with 30 Vicryls and used and subcuticular tissue of the harvest sites. The popliteal and  groin incisions were closed with 20 Vicryls in the fascia and 30 Vicryls and the skin. Sterile  dressings were applied the patient was transferred to the recovery stable condition   Curt Jews, M.D. 07/28/2015 2:51 PM

## 2015-07-28 NOTE — Interval H&P Note (Signed)
History and Physical Interval Note:  07/28/2015 8:35 AM  [name] [name]  has presented today for surgery, with the diagnosis of Left foot ulcer L97.409  The various methods of treatment have been discussed with the patient and family. After consideration of risks, benefits and other options for treatment, the patient has consented to  Procedure(s): BYPASS GRAFT FEMORAL-POPLITEAL ARTERY (Left) as a surgical intervention .  The patient's history has been reviewed, patient examined, no change in status, stable for surgery.  I have reviewed the patient's chart and labs.  Questions were answered to the patient's satisfaction.     Curt Jews

## 2015-07-28 NOTE — Telephone Encounter (Addendum)
-----   Message from Mena Goes, RN sent at 07/28/2015 12:58 PM EDT ----- Regarding: Schedule   ----- Message -----    From: Gabriel Earing, PA-C    Sent: 07/28/2015  12:14 PM      To: [facility] Charge Pool  S/p left fem pop 07/28/15.  F/u with Dr. Donnetta Hutching in 2 weeks.  Thanks, Samantha  notified patient of post op appt. on 08-14-14 12:15 with dr. early

## 2015-07-29 LAB — CBC
HCT: 38 % — ABNORMAL LOW (ref 39.0–52.0)
Hemoglobin: 12.7 g/dL — ABNORMAL LOW (ref 13.0–17.0)
MCH: 31 pg (ref 26.0–34.0)
MCHC: 33.4 g/dL (ref 30.0–36.0)
MCV: 92.7 fL (ref 78.0–100.0)
PLATELETS: 120 10*3/uL — AB (ref 150–400)
RBC: 4.1 MIL/uL — AB (ref 4.22–5.81)
RDW: 13.2 % (ref 11.5–15.5)
WBC: 6.5 10*3/uL (ref 4.0–10.5)

## 2015-07-29 LAB — BASIC METABOLIC PANEL
ANION GAP: 6 (ref 5–15)
BUN: 6 mg/dL (ref 6–20)
CO2: 30 mmol/L (ref 22–32)
Calcium: 9.1 mg/dL (ref 8.9–10.3)
Chloride: 99 mmol/L — ABNORMAL LOW (ref 101–111)
Creatinine, Ser: 0.68 mg/dL (ref 0.61–1.24)
GFR calc Af Amer: >60 mL/min (ref >60)
GLUCOSE: 82 mg/dL (ref 65–99)
POTASSIUM: 3.7 mmol/L (ref 3.5–5.1)
Sodium: 135 mmol/L (ref 135–145)

## 2015-07-29 LAB — GLUCOSE, CAPILLARY
GLUCOSE-CAPILLARY: 105 mg/dL — AB (ref 65–99)
Glucose-Capillary: 194 mg/dL — ABNORMAL HIGH (ref 65–99)
Glucose-Capillary: 81 mg/dL (ref 65–99)
Glucose-Capillary: 111 mg/dL — ABNORMAL HIGH (ref 65–99)

## 2015-07-29 MED ORDER — ENSURE ENLIVE PO LIQD: 237.0000 mL | Freq: Three times a day (TID) | ORAL | Status: DC

## 2015-07-29 NOTE — Progress Notes (Signed)
Initial Nutrition Assessment  DOCUMENTATION CODES:   Severe malnutrition in context of chronic illness  INTERVENTION:   Ensure Enlive po TID, each supplement provides 350 kcal and 20 grams of protein  NUTRITION DIAGNOSIS:   Increased nutrient needs related to wound healing as evidenced by estimated needs  GOAL:   Patient will meet greater than or equal to 90% of their needs  MONITOR:   PO intake, Supplement acceptance, Labs, Weight trends, I & O's, Skin  REASON FOR ASSESSMENT:   Malnutrition Screening Tool  ASSESSMENT:   64 year old Male with PMH of DM, HTN, seizure d/o and PVD; scheduled for left FPBG on 07/28/15 by Dr. Donnetta Hutching. He has known PVD and DM with limb threatening ischemia and extensive tissue loss on the dorsum of his left foot. He is at risk for limb loss without revascularization.   Patient s/p procedures 9/2: LEFT FEMORAL TO BELOW-KNEE POPLITEAL BYPASS WITH REVERSED GREAT SAPHENOUS VEIN BELOW-KNEE POPLITEAL ENDARTERECTOMY  Pt not very talkative upon RD visit.  Nods his head "No" when asked if he's eating well.  Pt seen per Clinical Nutrition during previous admission.  Pt with hx of malnutrition which is ongoing.  Nutrient needs given post-op, wound healing.  Would like Ensure Enlive supplements.  RD to order.  Nutrition-Focused physical exam completed. Findings are moderate fat depletion, severe muscle depletion, and no edema.   Diet Order:  Diet heart healthy/carb modified Room service appropriate?: Yes; Fluid consistency:: Thin  Skin:  Wound (see comment) (diabetic toe ulcer)  Last BM:  N/A  Height:   Ht Readings from Last 1 Encounters:  07/28/15 6\' 2"  (5.88 m)    Weight:   Wt Readings from Last 1 Encounters:  07/28/15 152 lb 12.5 oz (69.3 kg)    CBG (last 3)   Recent Labs  07/28/15 1720 07/28/15 2149 07/29/15 0838  GLUCAP 107* 97 105*    Ideal Body Weight:  86.3 kg  BMI:  Body mass index is 19.61 kg/(m^2).  Estimated Nutritional  Needs:   Kcal:  1900-2100  Protein:  100-110 gm  Fluid:  1.9-2.1 L  EDUCATION NEEDS:   No education needs identified at this time  Arthur Holms, RD, LDN Pager #: [phone] After-Hours Pager #: [phone]

## 2015-07-29 NOTE — Clinical Documentation Improvement (Signed)
Vascular Surgery  Please further clarify the skin ulcer specificity and document findings in next progress note and include in discharge summary if applicable.   Pressure Ulcer - stage if applicable  Non Pressure Ulcer due to DM2 PAD and Neuropathy  Other  Clinically Undetermined  Supporting Information:  Stable dry ulceration of left dorsum of foot and great toe.   Please exercise your independent, professional judgment when responding. A specific answer is not anticipated or expected.  Thank You, Zoila Shutter BSN, [place] [phone]

## 2015-07-29 NOTE — Progress Notes (Addendum)
  [facility] Progress Note  Subjective  - POD #1  Pain with incisions  Objective Filed Vitals:   07/29/15 0840  BP: 112/62  Pulse: 68  Temp: 98.3 F (36.8 C)  Resp: 16    Intake/Output Summary (Last 24 hours) at 07/29/15 0918 Last data filed at 07/29/15 0601  Gross per 24 hour  Intake   3260 ml  Output   3700 ml  Net   -440 ml    Leg left incisions clean and intact. Strong doppler flow to left DP and PT Stable dry ulceration of left dorsum of foot and great toe.   Assessment/Planning: 64 y.o. male is s/p: left femoropopliteal bypass 1 Day Post-Op   Bypass is patent with good doppler flow. Counseled on smoking cessation.  Mobilize. Transfer to 2W. DVT prophylaxis: Lovenox  Anticipate d/c in next 1-2 days.   [name] [name] 07/29/2015 9:18 AM --  Laboratory CBC    Component Value Date/Time   WBC 6.5 07/29/2015 0237   WBC 5.9 07/28/2009 1533   HGB 12.7* 07/29/2015 0237   HGB 12.5* 07/28/2009 1533   HCT 38.0* 07/29/2015 0237   HCT 37.1* 07/28/2009 1533   PLT 120* 07/29/2015 0237   PLT 145 07/28/2009 1533    BMET    Component Value Date/Time   NA 135 07/29/2015 0237   K 3.7 07/29/2015 0237   CL 99* 07/29/2015 0237   CO2 30 07/29/2015 0237   GLUCOSE 82 07/29/2015 0237   BUN 6 07/29/2015 0237   CREATININE 0.68 07/29/2015 0237   CALCIUM 9.1 07/29/2015 0237   GFRNONAA >60 07/29/2015 0237   GFRAA >60 07/29/2015 0237    COAG Lab Results  Component Value Date   INR 1.09 07/26/2015   INR 1.3* 12/01/2013   INR 1.03 10/23/2013   No results found for: PTT  Antibiotics Anti-infectives    Start     Dose/Rate Route Frequency Ordered Stop   07/28/15 1715  vancomycin (VANCOCIN) IVPB 1000 mg/200 mL premix     1,000 mg 200 mL/hr over 60 Minutes Intravenous Every 12 hours 07/28/15 1706     07/28/15 0739  vancomycin (VANCOCIN) IVPB 1000 mg/200 mL premix     1,000 mg 200 mL/hr over 60 Minutes Intravenous 60 min pre-op 07/28/15 0739  07/28/15 1115       Virgina Jock, PA-C [facility] Office: [phone] Pager: [phone] 07/29/2015 9:18 AM  Agree with above.  Left foot warm Incisions look fine Continue dressing changes with silvadene Home when ambulating. Possibly tomorrow.  Deitra Mayo, MD, [place] [phone] Office: [phone]

## 2015-07-29 NOTE — Progress Notes (Signed)
Report called to Jeneen Rinks, RN on 2W. Patient to be transferred via bed on tele to 2W18 by Tilda Burrow, RN and Patsy [place], NT. Belongings sent with patient. No family at bedside.

## 2015-07-29 NOTE — Evaluation (Signed)
Physical Therapy Evaluation Patient Details Name: [name] [name] MRN: [id number] DOB: 11/16/1951 Today's Date: 07/29/2015   History of Present Illness  Pt admitted 9/2 with ganrenous changes to L foot with severe ischemia whose now s/p L fem pop.  Clinical Impression  Pt admitted with above. Pt with significant L LE pain however educated pt on importance of AROM to L LE and mobility to help with blood flow especially when L LE in dependent position. Concerned for pt living by himself however he reports "i have a guy who can stay with me, i don't really want him to but he can." PT to see again Sunday to assess stair negotiation and progress ambulation for re-assessment of d/c recs.    Follow Up Recommendations Home health PT;Supervision/Assistance - 24 hour    Equipment Recommendations  Rolling walker with 5" wheels    Recommendations for Other Services       Precautions / Restrictions Precautions Precautions: Fall Precaution Comments: L LE pain and dorsum of foot ulcer      Mobility  Bed Mobility Overal bed mobility: Modified Independent             General bed mobility comments: increased time, uses UE to assist with LE managment  Transfers Overall transfer level: Needs assistance Equipment used: Rolling walker (2 wheeled) Transfers: Sit to/from Stand Sit to Stand: Min guard         General transfer comment: min guard for safety due to pain , v/c's for walker safety  Ambulation/Gait Ambulation/Gait assistance: Min assist Ambulation Distance (Feet): 75 Feet Assistive device: Rolling walker (2 wheeled) Gait Pattern/deviations: Step-through pattern;Decreased stride length;Decreased dorsiflexion - left;Decreased stance time - left Gait velocity: slow   General Gait Details: antalgic, depends on UEs to offweight L LE, v/c's for walker safety. pt did report decreased pain after mobility  Stairs            Wheelchair Mobility    Modified Rankin (Stroke  Patients Only)       Balance Overall balance assessment: Needs assistance         Standing balance support: Bilateral upper extremity supported Standing balance-Leahy Scale: Poor Standing balance comment: needs UE support due to L LE pain                             Pertinent Vitals/Pain Pain Assessment: 0-10 Pain Score: 7  (7 lying in bed, 10+ with mobility) Pain Location: L LE Pain Descriptors / Indicators: Burning Pain Intervention(s): Monitored during session;Patient requesting pain meds-RN notified    Home Living Family/patient expects to be discharged to:: Private residence Living Arrangements: Alone Available Help at Discharge: Friend(s);Available 24 hours/day Type of Home: House Home Access: Stairs to enter Entrance Stairs-Rails: Left Entrance Stairs-Number of Steps: 2 Home Layout: One level Home Equipment: Cane - single point      Prior Function Level of Independence: Independent               Hand Dominance   Dominant Hand: Right    Extremity/Trunk Assessment   Upper Extremity Assessment: Overall WFL for tasks assessed           Lower Extremity Assessment: LLE deficits/detail   LLE Deficits / Details:  foot ulcers, fem, pop, limited active hip/knee flexion due to pain, able to dorsiflex  Cervical / Trunk Assessment: Normal  Communication   Communication: No difficulties  Cognition Arousal/Alertness: Awake/alert Behavior During Therapy: WFL for tasks  assessed/performed Overall Cognitive Status: Within Functional Limits for tasks assessed                      General Comments      Exercises General Exercises - Lower Extremity Ankle Circles/Pumps: AROM;Both;20 reps Quad Sets: AROM;20 reps;Both Heel Slides: AROM;AAROM;Left;10 reps;Supine      Assessment/Plan    PT Assessment Patient needs continued PT services  PT Diagnosis Difficulty walking;Acute pain   PT Problem List Decreased strength;Decreased range of  motion;Decreased activity tolerance;Decreased balance;Decreased mobility  PT Treatment Interventions DME instruction;Gait training;Stair training;Functional mobility training;Therapeutic activities;Therapeutic exercise;Balance training;Neuromuscular re-education   PT Goals (Current goals can be found in the Care Plan section) Acute Rehab PT Goals Patient Stated Goal: decreased pain PT Goal Formulation: With patient Time For Goal Achievement: 08/05/15 Potential to Achieve Goals: Good    Frequency Min 3X/week   Barriers to discharge Decreased caregiver support lives alone    Co-evaluation               End of Session Equipment Utilized During Treatment: Gait belt Activity Tolerance: Patient limited by pain Patient left: in bed;with call bell/phone within reach (L LE elevated) Nurse Communication: Mobility status         Time: 3790-2409 PT Time Calculation (min) (ACUTE ONLY): 41 min   Charges:   PT Evaluation $Initial PT Evaluation Tier I: 1 Procedure PT Treatments $Gait Training: 8-22 mins $Therapeutic Exercise: 8-22 mins   PT G CodesKingsley Callander 07/29/2015, 3:37 PM   Kittie Plater, PT, DPT Pager #: [phone] Office #: [phone]

## 2015-07-30 ENCOUNTER — Encounter (HOSPITAL_COMMUNITY): Payer: Medicaid Other

## 2015-07-30 DIAGNOSIS — E43 Unspecified severe protein-calorie malnutrition: Secondary | ICD-10-CM | POA: Insufficient documentation

## 2015-07-30 LAB — GLUCOSE, CAPILLARY
GLUCOSE-CAPILLARY: 120 mg/dL — AB (ref 65–99)
Glucose-Capillary: 181 mg/dL — ABNORMAL HIGH (ref 65–99)

## 2015-07-30 MED ORDER — OXYCODONE HCL 15 MG PO TABS: 45.0000 mg | ORAL_TABLET | Freq: Four times a day (QID) | ORAL | Status: DC | PRN

## 2015-07-30 MED ORDER — SILVER SULFADIAZINE 1 % EX CREA
1.0000 "application " | TOPICAL_CREAM | Freq: Every day | CUTANEOUS | Status: DC
  Filled 2015-07-30: qty 85

## 2015-07-30 MED ORDER — OXYCODONE HCL 15 MG PO TABS: 45.0000 mg | ORAL_TABLET | ORAL | Status: DC | PRN

## 2015-07-30 NOTE — Care Management Note (Signed)
Case Management Note  Patient Details  Name: [name] [name] MRN: [id number] Date of Birth: Aug 18, 1951  Subjective/Objective:       Left femoral to below-knee popliteal bypass              Action/Plan: Home Health due to insurance [facility]PT is non-covered for listed condition. [place] DME rep delivered RW to pt room prior to dc. Contacted pt via phone to make aware [facility] not covered. Left message for return call.   Expected Discharge Date:                  Expected Discharge Plan:  Home/Self Care  In-House Referral:     Discharge planning Services  CM Consult  Post Acute Care Choice:    Choice offered to:     DME Arranged:  Walker rolling DME Agency:  [place]:    [place]:     Status of Service:  Completed, signed off  Medicare Important Message Given:    Date Medicare IM Given:    Medicare IM give by:    Date Additional Medicare IM Given:    Additional Medicare Important Message give by:     If discussed at [place] of Stay Meetings, dates discussed:    Additional Comments:  Erenest Rasher, RN 07/30/2015, 7:50 PM

## 2015-07-30 NOTE — Progress Notes (Signed)
Utilization Review Completed.Reita Shindler T9/02/2015  

## 2015-07-30 NOTE — Progress Notes (Addendum)
  [facility] Progress Note  Subjective  - POD #2  Foot feeling better. Wants to go home.   Objective Filed Vitals:   07/30/15 0519  BP: 110/65  Pulse: 59  Temp: 99.1 F (37.3 C)  Resp: 16    Intake/Output Summary (Last 24 hours) at 07/30/15 0757 Last data filed at 07/30/15 0542  Gross per 24 hour  Intake    720 ml  Output   1475 ml  Net   -755 ml    Leg leg groin and medial leg incisions healing well. Large ulcer on dorsum of foot clean Left great toe dry ulceration stable Brisk doppler flow to left foot  Assessment/Planning: 64 y.o. male is s/p: left femoropopliteal bypass 2 Days Post-Op   Left foot well perfused.  ABIs pending. Has ambulated well in halls with walker. Wants to go home, but pain is marginally controlled. Will discontinue morphine and see if pain is controlled on only po meds.  Silvadene daily to left foot ulceration. Will check on patient later today. Possible d/c home today.  Alvia Grove 07/30/2015 7:57 AM --  Laboratory CBC    Component Value Date/Time   WBC 6.5 07/29/2015 0237   WBC 5.9 07/28/2009 1533   HGB 12.7* 07/29/2015 0237   HGB 12.5* 07/28/2009 1533   HCT 38.0* 07/29/2015 0237   HCT 37.1* 07/28/2009 1533   PLT 120* 07/29/2015 0237   PLT 145 07/28/2009 1533    BMET    Component Value Date/Time   NA 135 07/29/2015 0237   K 3.7 07/29/2015 0237   CL 99* 07/29/2015 0237   CO2 30 07/29/2015 0237   GLUCOSE 82 07/29/2015 0237   BUN 6 07/29/2015 0237   CREATININE 0.68 07/29/2015 0237   CALCIUM 9.1 07/29/2015 0237   GFRNONAA >60 07/29/2015 0237   GFRAA >60 07/29/2015 0237    COAG Lab Results  Component Value Date   INR 1.09 07/26/2015   INR 1.3* 12/01/2013   INR 1.03 10/23/2013   No results found for: PTT  Antibiotics Anti-infectives    Start     Dose/Rate Route Frequency Ordered Stop   07/28/15 1715  vancomycin (VANCOCIN) IVPB 1000 mg/200 mL premix     1,000 mg 200 mL/hr over 60 Minutes  Intravenous Every 12 hours 07/28/15 1706     07/28/15 0739  vancomycin (VANCOCIN) IVPB 1000 mg/200 mL premix     1,000 mg 200 mL/hr over 60 Minutes Intravenous 60 min pre-op 07/28/15 0739 07/28/15 1115       Virgina Jock, PA-C [facility] Office: [phone] Pager: [phone] 07/30/2015 7:57 AM  Agree with above. For discharge today. Continue dressing changes with silvadene to wound on dorsum of left foot.   Deitra Mayo, MD, [place] [phone]

## 2015-07-30 NOTE — Progress Notes (Signed)
Discharged to home with family office visits in place teaching done  

## 2015-08-01 ENCOUNTER — Encounter (HOSPITAL_COMMUNITY): Payer: Self-pay | Admitting: Vascular Surgery

## 2015-08-14 ENCOUNTER — Encounter: Payer: Self-pay | Admitting: Vascular Surgery

## 2015-08-15 ENCOUNTER — Encounter: Payer: Self-pay | Admitting: Vascular Surgery

## 2015-08-15 ENCOUNTER — Ambulatory Visit (INDEPENDENT_AMBULATORY_CARE_PROVIDER_SITE_OTHER): Payer: Medicaid Other | Admitting: Vascular Surgery

## 2015-08-15 VITALS — BP 151/80 | HR 56 | Temp 98.2°F | Resp 18 | Ht 74.0 in | Wt 140.2 lb

## 2015-08-15 DIAGNOSIS — I998 Other disorder of circulatory system: Secondary | ICD-10-CM

## 2015-08-15 DIAGNOSIS — I70229 Atherosclerosis of native arteries of extremities with rest pain, unspecified extremity: Secondary | ICD-10-CM

## 2015-08-15 NOTE — Progress Notes (Signed)
    Postoperative Visit   History of Present Illness  [name] [name] is a 64 y.o. year old male who presents for postoperative follow-up for: left femoral to below-knee popliteal bypass with reversed great saphenous vein and below knee popliteal endarterectomy (Date: 07/28/15).  The patient's incisions are nearly healed. The patient continues to use silvadene every other day to the large wound on the dorsum of his left foot.  The patient notes improvement of his foot pain stating he "can wiggle my toes now."   For VQI Use Only  PRE-ADM LIVING: Home  AMB STATUS: Ambulatory  Physical Examination  Filed Vitals:   08/15/15 1253  BP: 151/80  Pulse: 56  Temp:   Resp:    LLE: Incisions are nearly healed, full thickness wound to left dorsum of foot with pink granulation tissue at perimeter. Dry black eschar to left great toe, no drainage from wounds, no evidence of infection. Palpable left popliteal pulse.   Medical Decision Making  [name] [name] is a 64 y.o. year old male who presents s/p  left femoral to below-knee popliteal bypass with reversed great saphenous vein and below knee popliteal endarterectomy (Date: 07/28/15) for gangrenous changes of left foot with severe ischemia .   The patient's bypass is patient and his incisions are healing appropriately with improvement of pre-operative symptoms. He will need to continue silvadene daily to every other day to the large wound on the dorsum of his foot. His left great toe wound is improving. The wounds do not appear infected.   Follow up in 3 weeks to evaluate wounds. He may need some debridement at that time.   Virgina Jock, PA-C [facility] [place] Office: [phone] Pager: [phone]  08/15/2015, 1:17 PM  This patient was seen and examined in conjunction with Dr. Donnetta Hutching.   I have examined the patient, reviewed and agree with above. Easily palpable popliteal graft pulse. Foot well-perfused.  Healing of the groin and other femoropopliteal incisions. Continues to have a very worrisome full-thickness eschar on the dorsum of his foot. This is not separating as of yet. Explained the patient this eventually will require sharp debridement as it begins to separate. Also has an eschar over the dorsum of his great toe just proximal to the nailbed. This does appear to be healing nicely as well. We'll continue Silvadene treatment to this area. We'll see him again in 3 weeks for continued follow-up  Curt Jews, MD 08/15/2015 1:29 PM

## 2015-08-15 NOTE — Progress Notes (Signed)
Filed Vitals:   08/15/15 1249 08/15/15 1253  BP: 137/95 151/80  Pulse: 55 56  Temp: 98.2 F (36.8 C)   TempSrc: Oral   Resp: 18   Height: 6\' 2"  (1.88 m)   Weight: 140 lb 3.2 oz (63.594 kg)   SpO2: 100%

## 2015-08-31 NOTE — Discharge Summary (Signed)
[facility] Discharge Summary  WILBERN PENNYPACKER 1951/08/23 64 y.o. male  [id number]  Admission Date: 07/28/2015  Discharge Date: 07/30/2015  Physician: Curt Jews, MD  Admission Diagnosis: Left foot ulcer L97.409  HPI:   This is a 64 y.o. male who presented for continued follow-up of his left foot. Seen Dr. Scot Dock 3 weeks prior. He is well-known to [facility] from prior aortobifemoral bypass for severe rest pain in 2012. He initially had trauma to the dorsum of his left great toe. He also has some blistering on the dorsum of his foot. This has progressed extensively since his visit with Dr. Scot Dock 3 weeks ago. He now has a large 5-6 cm full-thickness ulcer on the dorsum of his foot. His toe actually looks okay. He has diffuse erythema of his foot as well.  Hospital Course:  The patient was admitted to the hospital and taken to the operating room on 07/28/2015 and underwent: Left femoral to below-knee popliteal bypass with reversed great saphenous vein, below-knee popliteal endarterectomy   The patient tolerated the procedure well and was transported to the PACU in  stable condition.   POD 1: His bypass was patent with good doppler flow. His incisions were clean. His left foot wounds were stable and dry.   POD 2: His pain was adequately controlled and he was ambulating well in the halls. He was discharged home in good condition. He was to continue with silvadene dressing changes daily to the dorsum of his left foot.    CBC    Component Value Date/Time   WBC 6.5 07/29/2015 0237   WBC 5.9 07/28/2009 1533   RBC 4.10* 07/29/2015 0237   RBC 3.98* 07/28/2009 1533   HGB 12.7* 07/29/2015 0237   HGB 12.5* 07/28/2009 1533   HCT 38.0* 07/29/2015 0237   HCT 37.1* 07/28/2009 1533   PLT 120* 07/29/2015 0237   PLT 145 07/28/2009 1533   MCV 92.7 07/29/2015 0237   MCV 93.3 07/28/2009 1533   MCH 31.0 07/29/2015 0237   MCH 31.3 07/28/2009 1533   MCHC 33.4 07/29/2015 0237   MCHC 33.6 07/28/2009 1533   RDW 13.2 07/29/2015 0237   RDW 14.7* 07/28/2009 1533   LYMPHSABS 2.0 09/11/2014 1301   LYMPHSABS 2.4 07/28/2009 1533   MONOABS 0.4 09/11/2014 1301   MONOABS 0.4 07/28/2009 1533   EOSABS 0.1 09/11/2014 1301   EOSABS 0.2 07/28/2009 1533   BASOSABS 0.0 09/11/2014 1301   BASOSABS 0.1 07/28/2009 1533    BMET    Component Value Date/Time   NA 135 07/29/2015 0237   K 3.7 07/29/2015 0237   CL 99* 07/29/2015 0237   CO2 30 07/29/2015 0237   GLUCOSE 82 07/29/2015 0237   BUN 6 07/29/2015 0237   CREATININE 0.68 07/29/2015 0237   CALCIUM 9.1 07/29/2015 0237   GFRNONAA >60 07/29/2015 0237   GFRAA >60 07/29/2015 0237     Discharge Instructions:   The patient is discharged to home with extensive instructions on wound care and progressive ambulation.  They are instructed not to drive or perform any heavy lifting until returning to see the physician in his office.  Discharge Instructions    Call MD for:  redness, tenderness, or signs of infection (pain, swelling, bleeding, redness, odor or green/yellow discharge around incision site)    Complete by:  As directed      Call MD for:  severe or increased pain, loss or decreased feeling  in affected limb(s)    Complete by:  As directed      Call MD for:  temperature >100.5    Complete by:  As directed      Discharge wound care:    Complete by:  As directed   Shower daily. Wash leg wounds daily with soap and water and pat dry. Apply silvadene cream to left foot wound daily and apply dry dressing on top. Wash left big toe daily. Wrap toe with dry gauze daily.     Driving Restrictions    Complete by:  As directed   No driving for 2 weeks     Increase activity slowly    Complete by:  As directed   Walk with assistance use walker or cane as needed     Lifting restrictions    Complete by:  As directed   No lifting for 2 weeks     Resume previous diet    Complete by:  As directed            Discharge Diagnosis:    Left foot ulcer L97.409  Secondary Diagnosis: Patient Active Problem List   Diagnosis Date Noted  . Protein-calorie malnutrition, severe ([place]) 07/30/2015  . PAD (peripheral artery disease) ([place]) 07/28/2015  . Chest pain 11/10/2013  . Shortness of breath 11/10/2013  . Shoulder contusion 11/07/2013  . Generalized weakness 10/23/2013  . Atypical chest pain 10/23/2013  . Bradycardia 10/23/2013  . CAD (coronary artery disease) 10/23/2013  . Peripheral vascular disease, unspecified ([place]) 07/27/2013  . Light headedness 07/02/2013  . Anxiety and depression 07/02/2013  . Ecchymosis 06/11/2013  . Dyspnea 06/10/2013  . NSTEMI (non-ST elevated myocardial infarction) ([place]) 06/05/2013  . Tobacco abuse 06/05/2013  . Dyslipidemia 06/05/2013  . Type 2 diabetes mellitus ([place]) 06/05/2013  . Seizure disorder ([place]) 06/05/2013  . Abdominal aneurysm without mention of rupture 12/17/2011  . PVD (peripheral vascular disease) with claudication ([place]) 11/21/2011  . Atherosclerosis of native arteries of the extremities with intermittent claudication 10/09/2011   Past Medical History  Diagnosis Date  . Hyperlipidemia   . Hypertension   . Leg pain   . Peripheral vascular disease     s/p prior Ao-Bifem BPG  . 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 Coronary artery disease     a. NSTEMI 7/14=> LHC 06/07/13: Proximal LAD 30%, ostial D1 30%, AV circumflex 80%, then occluded, ostial OM2 occluded, left to left collaterals, mid RCA 95%, inferior HK, EF 60%. PCI: Promus DES to the mid RCA.  No intervention was recommended for the CTO circumflex.  . Ischemic cardiomyopathy     a. Echocardiogram 06/06/13: EF 35-40%, inferior AK, mild MR.  Marland Kitchen Shortness of breath     "can come on at anytime lately; before that it was just w/exertion" (11/10/2013)  . Type II diabetes mellitus   . Grand mal seizure     "last one was ~ 3 yr ago; controlled w/daily RX" (11/10/2013)  .  Arthritis     "neck, back, legs" (11/10/2013)  . Kidney stones     "when I was younger; passed w/o treatment" (11/10/2013)  . Myocardial infarction   . Diabetic neuropathy   . H. pylori infection   . Wears glasses        Medication List    STOP taking these medications        amoxicillin-clavulanate 875-125 MG tablet  Commonly known as:  AUGMENTIN      TAKE these medications  albuterol 108 (90 BASE) MCG/ACT inhaler  Commonly known as:  PROVENTIL HFA;VENTOLIN HFA  Inhale 2 puffs into the lungs every 6 (six) hours as needed for wheezing or shortness of breath.     CALCIUM MAGNESIUM PO  Take 4 tablets by mouth 3 (three) times daily.     esomeprazole 40 MG capsule  Commonly known as:  NEXIUM  Take 40 mg by mouth daily as needed (for acid reflex).     gabapentin 300 MG capsule  Commonly known as:  NEURONTIN  Take 1 capsule (300 mg total) by mouth 3 (three) times daily.     glipiZIDE 10 MG tablet  Commonly known as:  GLUCOTROL  Take 10 mg by mouth 2 (two) times daily before a meal.     ketoconazole 2 % cream  Commonly known as:  NIZORAL  Apply 1 application topically daily as needed for irritation.     multivitamin with minerals tablet  Take 4 tablets by mouth 3 (three) times daily.     nitroGLYCERIN 0.4 MG SL tablet  Commonly known as:  NITROSTAT  Place 0.4 mg under the tongue every 5 (five) minutes as needed for chest pain.     oxyCODONE 15 MG immediate release tablet  Commonly known as:  ROXICODONE  Take 3 tablets by mouth 4 (four) times daily.     oxyCODONE 15 MG immediate release tablet  Commonly known as:  ROXICODONE  Take 3 tablets (45 mg total) by mouth every 6 (six) hours as needed (severe pain).     PATADAY 0.2 % Soln  Generic drug:  Olopatadine HCl  Place 1 drop into both eyes daily.     phenytoin 100 MG ER capsule  Commonly known as:  DILANTIN  Take 200-300 mg by mouth 2 (two) times daily. Take 200 mg in the morning & 300 mg in the  evening.     polyethylene glycol packet  Commonly known as:  MIRALAX / GLYCOLAX  Take 17 g by mouth 2 (two) times daily.     promethazine 25 MG tablet  Commonly known as:  PHENERGAN  Take 1 tablet (25 mg total) by mouth every 8 (eight) hours as needed for nausea or vomiting.     silver sulfADIAZINE 1 % cream  Commonly known as:  SILVADENE  Apply 1 application topically daily.     triamcinolone cream 0.1 %  Commonly known as:  KENALOG  Apply 1 application topically 2 (two) times daily as needed (for eczema).     zolpidem 10 MG tablet  Commonly known as:  AMBIEN  Take 10 mg by mouth at bedtime as needed for sleep.        Oxycodone #60 No Refill  Disposition: Home  Patient's condition: is Good  Follow up: 1. Dr. Donnetta Hutching in 2 weeks   Virgina Jock, PA-C [facility] [phone] 08/31/2015  8:24 AM  - For VQI Registry use --- Instructions: Press F2 to tab through selections.  Delete question if not applicable.   Post-op:  Wound infection: No  Graft infection: No  Transfusion: No  New Arrhythmia: No Ipsilateral amputation: No, [ ]  Minor, [ ]  BKA, [ ]  AKA Discharge patency: [x ] Primary, [ ]  Primary assisted, [ ]  Secondary, [ ]  Occluded Patency judged by: [ x] Dopper only, [ ]  Palpable graft pulse, [ ]  Palpable distal pulse, [ ]  ABI inc. > 0.15, [ ]  Duplex D/C Ambulatory Status: Ambulatory with Assistance  Complications: MI: No, [ ]  Troponin  only, [ ]  EKG or Clinical CHF: No Resp failure:No, [ ]  Pneumonia, [ ]  Ventilator Chg in renal function: No, [ ]  Inc. Cr > 0.5, [ ]  Temp. Dialysis, [ ]  Permanent dialysis Stroke: No, [ ]  Minor, [ ]  Major Return to OR: No  Reason for return to OR: [ ]  Bleeding, [ ]  Infection, [ ]  Thrombosis, [ ]  Revision  Discharge medications: Statin use:  No, cannot tolerate ASA use:  no Plavix use:  no Beta blocker use: no Coumadin use: no

## 2015-09-04 ENCOUNTER — Encounter: Payer: Self-pay | Admitting: Vascular Surgery

## 2015-09-05 ENCOUNTER — Encounter: Payer: Self-pay | Admitting: Vascular Surgery

## 2015-09-05 ENCOUNTER — Ambulatory Visit (INDEPENDENT_AMBULATORY_CARE_PROVIDER_SITE_OTHER): Payer: Self-pay | Admitting: Vascular Surgery

## 2015-09-05 VITALS — BP 153/91 | HR 57 | Temp 98.3°F | Resp 14 | Ht 75.0 in | Wt 140.0 lb

## 2015-09-05 DIAGNOSIS — I70229 Atherosclerosis of native arteries of extremities with rest pain, unspecified extremity: Secondary | ICD-10-CM

## 2015-09-05 DIAGNOSIS — I998 Other disorder of circulatory system: Secondary | ICD-10-CM

## 2015-09-05 NOTE — Patient Instructions (Signed)
Please review the tobacco cessation information given to you today. It lists many hints that are useful in your effort to stop smoking. The [facility] contact phone # is [phone] These nurses and advisors offer lots of FREE information and aids to help you quit.    The [facility]uit [facility]oking line #  ([phone], they will also assist you with programs designed to help you stop smoking.    Smoking Cessation, Tips for Success If you are ready to quit smoking, congratulations! You have chosen to help yourself be healthier. Cigarettes bring nicotine, tar, carbon monoxide, and other irritants into your body. Your lungs, heart, and blood vessels will be able to work better without these poisons. There are many different ways to quit smoking. Nicotine gum, nicotine patches, a nicotine inhaler, or nicotine nasal spray can help with physical craving. Hypnosis, support groups, and medicines help break the habit of smoking. WHAT THINGS CAN I DO TO MAKE QUITTING EASIER?  Here are some tips to help you quit for good:  Pick a date when you will quit smoking completely. Tell all of your friends and family about your plan to quit on that date.  Do not try to slowly cut down on the number of cigarettes you are smoking. Pick a quit date and quit smoking completely starting on that day.  Throw away all cigarettes.   Clean and remove all ashtrays from your home, work, and car.  On a card, write down your reasons for quitting. Carry the card with you and read it when you get the urge to smoke.  Cleanse your body of nicotine. Drink enough water and fluids to keep your urine clear or pale yellow. Do this after quitting to flush the nicotine from your body.  Learn to predict your moods. Do not let a bad situation be your excuse to have a cigarette. Some situations in your life might tempt you into wanting a cigarette.  Never have "just one" cigarette. It leads to wanting another and another. Remind  yourself of your decision to quit.  Change habits associated with smoking. If you smoked while driving or when feeling stressed, try other activities to replace smoking. Stand up when drinking your coffee. Brush your teeth after eating. Sit in a different chair when you read the paper. Avoid alcohol while trying to quit, and try to drink fewer caffeinated beverages. Alcohol and caffeine may urge you to smoke.  Avoid foods and drinks that can trigger a desire to smoke, such as sugary or spicy foods and alcohol.  Ask people who smoke not to smoke around you.  Have something planned to do right after eating or having a cup of coffee. For example, plan to take a walk or exercise.  Try a relaxation exercise to calm you down and decrease your stress. Remember, you may be tense and nervous for the first 2 weeks after you quit, but this will pass.  Find new activities to keep your hands busy. Play with a pen, coin, or rubber band. Doodle or draw things on paper.  Brush your teeth right after eating. This will help cut down on the craving for the taste of tobacco after meals. You can also try mouthwash.   Use oral substitutes in place of cigarettes. Try using lemon drops, carrots, cinnamon sticks, or chewing gum. Keep them handy so they are available when you have the urge to smoke.  When you have the urge to smoke, try deep breathing.  Designate  your home as a nonsmoking area.  If you are a heavy smoker, ask your health care provider about a prescription for nicotine chewing gum. It can ease your withdrawal from nicotine.  Reward yourself. Set aside the cigarette money you save and buy yourself something nice.  Look for support from others. Join a support group or smoking cessation program. Ask someone at home or at work to help you with your plan to quit smoking.  Always ask yourself, "Do I need this cigarette or is this just a reflex?" Tell yourself, "Today, I choose not to smoke," or "I do  not want to smoke." You are reminding yourself of your decision to quit.  Do not replace cigarette smoking with electronic cigarettes (commonly called e-cigarettes). The safety of e-cigarettes is unknown, and some may contain harmful chemicals.  If you relapse, do not give up! Plan ahead and think about what you will do the next time you get the urge to smoke. HOW WILL I FEEL WHEN I QUIT SMOKING? You may have symptoms of withdrawal because your body is used to nicotine (the addictive substance in cigarettes). You may crave cigarettes, be irritable, feel very hungry, cough often, get headaches, or have difficulty concentrating. The withdrawal symptoms are only temporary. They are strongest when you first quit but will go away within 10-14 days. When withdrawal symptoms occur, stay in control. Think about your reasons for quitting. Remind yourself that these are signs that your body is healing and getting used to being without cigarettes. Remember that withdrawal symptoms are easier to treat than the major diseases that smoking can cause.  Even after the withdrawal is over, expect periodic urges to smoke. However, these cravings are generally short lived and will go away whether you smoke or not. Do not smoke! WHAT RESOURCES ARE AVAILABLE TO HELP ME QUIT SMOKING? Your health care provider can direct you to community resources or hospitals for support, which may include:  Group support.  Education.  Hypnosis.  Therapy.   This information is not intended to replace advice given to you by your health care provider. Make sure you discuss any questions you have with your health care provider.   Smoking Cessation, Tips for Success If you are ready to quit smoking, congratulations! You have chosen to help yourself be healthier. Cigarettes bring nicotine, tar, carbon monoxide, and other irritants into your body. Your lungs, heart, and blood vessels will be able to work better without these poisons. There  are many different ways to quit smoking. Nicotine gum, nicotine patches, a nicotine inhaler, or nicotine nasal spray can help with physical craving. Hypnosis, support groups, and medicines help break the habit of smoking. WHAT THINGS CAN I DO TO MAKE QUITTING EASIER?  Here are some tips to help you quit for good:  Pick a date when you will quit smoking completely. Tell all of your friends and family about your plan to quit on that date.  Do not try to slowly cut down on the number of cigarettes you are smoking. Pick a quit date and quit smoking completely starting on that day.  Throw away all cigarettes.   Clean and remove all ashtrays from your home, work, and car.  On a card, write down your reasons for quitting. Carry the card with you and read it when you get the urge to smoke.  Cleanse your body of nicotine. Drink enough water and fluids to keep your urine clear or pale yellow. Do this after quitting to  flush the nicotine from your body.  Learn to predict your moods. Do not let a bad situation be your excuse to have a cigarette. Some situations in your life might tempt you into wanting a cigarette.  Never have "just one" cigarette. It leads to wanting another and another. Remind yourself of your decision to quit.  Change habits associated with smoking. If you smoked while driving or when feeling stressed, try other activities to replace smoking. Stand up when drinking your coffee. Brush your teeth after eating. Sit in a different chair when you read the paper. Avoid alcohol while trying to quit, and try to drink fewer caffeinated beverages. Alcohol and caffeine may urge you to smoke.  Avoid foods and drinks that can trigger a desire to smoke, such as sugary or spicy foods and alcohol.  Ask people who smoke not to smoke around you.  Have something planned to do right after eating or having a cup of coffee. For example, plan to take a walk or exercise.  Try a relaxation exercise to  calm you down and decrease your stress. Remember, you may be tense and nervous for the first 2 weeks after you quit, but this will pass.  Find new activities to keep your hands busy. Play with a pen, coin, or rubber band. Doodle or draw things on paper.  Brush your teeth right after eating. This will help cut down on the craving for the taste of tobacco after meals. You can also try mouthwash.   Use oral substitutes in place of cigarettes. Try using lemon drops, carrots, cinnamon sticks, or chewing gum. Keep them handy so they are available when you have the urge to smoke.  When you have the urge to smoke, try deep breathing.  Designate your home as a nonsmoking area.  If you are a heavy smoker, ask your health care provider about a prescription for nicotine chewing gum. It can ease your withdrawal from nicotine.  Reward yourself. Set aside the cigarette money you save and buy yourself something nice.  Look for support from others. Join a support group or smoking cessation program. Ask someone at home or at work to help you with your plan to quit smoking.  Always ask yourself, "Do I need this cigarette or is this just a reflex?" Tell yourself, "Today, I choose not to smoke," or "I do not want to smoke." You are reminding yourself of your decision to quit.  Do not replace cigarette smoking with electronic cigarettes (commonly called e-cigarettes). The safety of e-cigarettes is unknown, and some may contain harmful chemicals.  If you relapse, do not give up! Plan ahead and think about what you will do the next time you get the urge to smoke. HOW WILL I FEEL WHEN I QUIT SMOKING? You may have symptoms of withdrawal because your body is used to nicotine (the addictive substance in cigarettes). You may crave cigarettes, be irritable, feel very hungry, cough often, get headaches, or have difficulty concentrating. The withdrawal symptoms are only temporary. They are strongest when you first quit but  will go away within 10-14 days. When withdrawal symptoms occur, stay in control. Think about your reasons for quitting. Remind yourself that these are signs that your body is healing and getting used to being without cigarettes. Remember that withdrawal symptoms are easier to treat than the major diseases that smoking can cause.  Even after the withdrawal is over, expect periodic urges to smoke. However, these cravings are generally short lived and  will go away whether you smoke or not. Do not smoke! WHAT RESOURCES ARE AVAILABLE TO HELP ME QUIT SMOKING? Your health care provider can direct you to community resources or hospitals for support, which may include:  Group support.  Education.  Hypnosis.  Therapy.   This information is not intended to replace advice given to you by your health care provider. Make sure you discuss any questions you have with your health care provider.   Document Released: 08/09/2004 Document Revised: 12/02/2014 Document Reviewed: 04/29/2013 Elsevier Interactive Patient Education [employer].

## 2015-09-05 NOTE — Progress Notes (Signed)
Here today for continued follow-up of his left foot wound. He had presented with severe gangrenous changes over the dorsum of his left foot. Underwent urgent left femoral to below-knee popliteal bypass on 07/28/2015. Had improved flow and good Lachelle Rissler healing of this on 3 weeks ago. He is undergoing daily Silvadene dressing changes.  On physical exam his left groin popliteal incisions are healed. He has easily palpable popliteal and posterior tibial pulse. He does have improvement on the dorsum of his foot. There is a black eschar on the dorsum of his great toe and also over the entire dorsum of his foot. He is beginning to have Yashvi Jasinski separation of this and has nice granulation tissue around the periphery of this large wound.  Again stressed the importance of smoking cessation with the patient. We'll continue with daily Silvadene dressing changes. We'll see him again in 3 months. May need a formal debridement at some point.

## 2015-09-05 NOTE — Progress Notes (Signed)
Filed Vitals:   09/05/15 1417 09/05/15 1418  BP: 153/69 153/91  Pulse: 57 57  Temp: 98.3 F (36.8 C)   Resp: 14   Height: 6\' 3"  (1.905 m)   Weight: 140 lb (63.504 kg)   SpO2: 100%

## 2015-09-21 ENCOUNTER — Encounter: Payer: Self-pay | Admitting: Vascular Surgery

## 2015-09-26 ENCOUNTER — Ambulatory Visit (INDEPENDENT_AMBULATORY_CARE_PROVIDER_SITE_OTHER): Payer: Medicaid Other | Admitting: Vascular Surgery

## 2015-09-26 ENCOUNTER — Encounter: Payer: Self-pay | Admitting: Vascular Surgery

## 2015-09-26 VITALS — BP 139/93 | HR 57 | Ht 75.0 in | Wt 138.4 lb

## 2015-09-26 DIAGNOSIS — I70229 Atherosclerosis of native arteries of extremities with rest pain, unspecified extremity: Secondary | ICD-10-CM

## 2015-09-26 DIAGNOSIS — I998 Other disorder of circulatory system: Secondary | ICD-10-CM

## 2015-09-26 NOTE — Progress Notes (Signed)
Here today for follow-up of his recent left femoral to popliteal bypass for limb salvage. He presented with extensive edematous changes over his left great toe and the entire dorsum of his foot. He underwent left femoral to below-knee popliteal bypass with reversed great saphenous vein and below-knee endarterectomy on 04/27/2015. He is having dressing changes with Silvadene on an every other day basis by home health nurse.  On physical exam today he does have a 2+ popliteal and posterior tibial pulse.  He is having some separation of the extensive eschar over the dorsum of his foot and does have granulation tissue around the periphery of this.  We'll continue local wound care and we will see him again in 3 weeks. Understands that there still a concern regarding the loss until this whole area is closed. Did discuss the operative dictation of plastic surgical evaluation with the potential debridement and skin grafting  Fillet this would have been a little to speed this up. He is comfortable with continued local care separation

## 2015-10-17 ENCOUNTER — Encounter: Payer: Self-pay | Admitting: Vascular Surgery

## 2015-10-24 ENCOUNTER — Ambulatory Visit (INDEPENDENT_AMBULATORY_CARE_PROVIDER_SITE_OTHER): Payer: Medicaid Other | Admitting: Vascular Surgery

## 2015-10-24 ENCOUNTER — Encounter: Payer: Self-pay | Admitting: Vascular Surgery

## 2015-10-24 VITALS — BP 160/88 | HR 49 | Ht 75.0 in | Wt 141.0 lb

## 2015-10-24 DIAGNOSIS — I70229 Atherosclerosis of native arteries of extremities with rest pain, unspecified extremity: Secondary | ICD-10-CM

## 2015-10-24 DIAGNOSIS — I998 Other disorder of circulatory system: Secondary | ICD-10-CM

## 2015-10-24 NOTE — Progress Notes (Signed)
Here today for continued follow-up after left femoral to below-knee popliteal bypass for critical limb ischemia on 07/28/2015. He had a full-thickness skin loss over the entire dorsum of his left foot. He has been having oh mouth every other day dressed this with  Silvadene. This also involves the dorsum of his great toe   On physical exam today he does have good granulation tissue around the periphery of this large area. The eschar is separating.   Does have palpable popliteal pulse.   I debrided the entire large eschar of also dorsum of his foot. He does have good granulation tissue over the majority of the area below this with some exudate as well.   Impression and plan continued progress. We will let home health no 2 change toSantyl for enzymatic debridement. We'll see him again in 3 weeks

## 2015-10-26 ENCOUNTER — Other Ambulatory Visit: Payer: Self-pay

## 2015-10-26 DIAGNOSIS — I998 Other disorder of circulatory system: Secondary | ICD-10-CM

## 2015-10-26 DIAGNOSIS — S91302D Unspecified open wound, left foot, subsequent encounter: Secondary | ICD-10-CM

## 2015-10-26 DIAGNOSIS — I70229 Atherosclerosis of native arteries of extremities with rest pain, unspecified extremity: Secondary | ICD-10-CM

## 2015-10-26 MED ORDER — COLLAGENASE 250 UNIT/GM EX OINT: TOPICAL_OINTMENT | CUTANEOUS | Status: DC

## 2015-11-06 ENCOUNTER — Encounter: Payer: Self-pay | Admitting: Cardiovascular Disease

## 2015-11-06 ENCOUNTER — Ambulatory Visit (INDEPENDENT_AMBULATORY_CARE_PROVIDER_SITE_OTHER): Payer: Medicaid Other | Admitting: Cardiovascular Disease

## 2015-11-06 VITALS — BP 128/90 | HR 48 | Ht 74.5 in | Wt 139.8 lb

## 2015-11-06 DIAGNOSIS — E785 Hyperlipidemia, unspecified: Secondary | ICD-10-CM

## 2015-11-06 DIAGNOSIS — I2510 Atherosclerotic heart disease of native coronary artery without angina pectoris: Secondary | ICD-10-CM | POA: Diagnosis not present

## 2015-11-06 MED ORDER — ASPIRIN EC 81 MG PO TBEC: 81.0000 mg | DELAYED_RELEASE_TABLET | Freq: Every day | ORAL | Status: DC

## 2015-11-06 NOTE — Progress Notes (Signed)
Cardiology Office Note Date:  11/06/2015   ID:  [name] [name], DOB 1951-10-28, MRN [id number]  PCP:  Marijean Bravo, MD  Cardiologist:  Sherren Mocha, MD    Chief Complaint  Patient presents with  . Annual Exam  . Bradycardia   History of Present Illness: [name] [name] is a 64 y.o. male who presents for follow-up evaluation. The patient has CAD - He presented in 2014 with NSTEMI and was found to have severe stenosis of the RCA treated with PCI (Promus DES) and chronic occlusion of the LCx/OM1 branch treated medically. Other medical problems include PAD with history of aortobifemoral bypass, HTN, hyperlipidemia, diabetes, and seizure disorder. Since he was last seen, he developed critical limb ischemia on the left leg and required fem-pop bypass.  He states that his foot is healing well now. He denies chest pain or pressure. He has not been active past. He has no symptoms with physical exertion. He denies orthopnea, PND , or leg swelling. He has lost some weight and describes a poor appetite. He has not been on antiplatelet therapy with aspirin or clopidogrel. States that these medicines have upset his stomach at times in the past.   Past Medical History  Diagnosis Date  . Hyperlipidemia   . Hypertension   . Leg pain   . Peripheral vascular disease ([place])     s/p prior Ao-Bifem BPG  . 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 Coronary artery disease     a. NSTEMI 7/14=> LHC 06/07/13: Proximal LAD 30%, ostial D1 30%, AV circumflex 80%, then occluded, ostial OM2 occluded, left to left collaterals, mid RCA 95%, inferior HK, EF 60%. PCI: Promus DES to the mid RCA.  No intervention was recommended for the CTO circumflex.  . Ischemic cardiomyopathy     a. Echocardiogram 06/06/13: EF 35-40%, inferior AK, mild MR.  Marland Kitchen Shortness of breath     "can come on at anytime lately; before that it was just w/exertion" (11/10/2013)  . Type II  diabetes mellitus ([place])   . [place] mal seizure [facility])     "last one was ~ 3 yr ago; controlled w/daily RX" (11/10/2013)  . Arthritis     "neck, back, legs" (11/10/2013)  . Kidney stones     "when I was younger; passed w/o treatment" (11/10/2013)  . Myocardial infarction ([place])   . Diabetic neuropathy ([place])   . H. pylori infection   . Wears glasses     Past Surgical History  Procedure Laterality Date  . Iliac artery stent  05/06/2011  . Tonsillectomy    . Aorta - bilateral femoral artery bypass graft  11/25/2011    Procedure: AORTA BIFEMORAL BYPASS GRAFT;  Surgeon: Rosetta Posner, MD;  Location: [place];  Service: Vascular;  Laterality: N/A;  . Pr vein bypass graft,aorto-fem-pop  11/24/2012  . Inguinal hernia repair Right   . Abdominal aortagram N/A 11/11/2011    Procedure: ABDOMINAL Maxcine Ham;  Surgeon: Angelia Mould, MD;  Location: [facility] CATH LAB;  Service: Cardiovascular;  Laterality: N/A;  . Lower extremity angiogram Bilateral 11/11/2011    Procedure: LOWER EXTREMITY ANGIOGRAM;  Surgeon: Angelia Mould, MD;  Location: [facility] CATH LAB;  Service: Cardiovascular;  Laterality: Bilateral;  . Left heart catheterization with coronary angiogram N/A 06/07/2013    Procedure: LEFT HEART CATHETERIZATION WITH CORONARY ANGIOGRAM;  Surgeon: Sherren Mocha, MD;  Location: [facility] CATH LAB;  Service: Cardiovascular;  Laterality: N/A;  . Percutaneous coronary stent intervention (pci-s) Right 06/07/2013    Procedure: PERCUTANEOUS CORONARY STENT INTERVENTION (PCI-S);  Surgeon: Sherren Mocha, MD;  Location: [facility] CATH LAB;  Service: Cardiovascular;  Laterality: Right;  . Left heart catheterization with coronary angiogram N/A 12/10/2013    Procedure: LEFT HEART CATHETERIZATION WITH CORONARY ANGIOGRAM;  Surgeon: Blane Ohara, MD;  Location: [facility] CATH LAB;  Service: Cardiovascular;  Laterality: N/A;  . Colonoscopy    . Peripheral vascular catheterization N/A 07/26/2015    Procedure: Abdominal Aortogram;  Surgeon:  Serafina Mitchell, MD;  Location: [place] CV LAB;  Service: Cardiovascular;  Laterality: N/A;  . Peripheral vascular catheterization Bilateral 07/26/2015    Procedure: Lower Extremity Angiography;  Surgeon: Serafina Mitchell, MD;  Location: [place] CV LAB;  Service: Cardiovascular;  Laterality: Bilateral;  . Femoral-popliteal bypass graft Left 07/28/2015    Procedure: LEFT FEMORAL-POPLITEAL ARTERY BYPASS GRAFT;  Surgeon: Rosetta Posner, MD;  Location: [facility] OR;  Service: Vascular;  Laterality: Left;    Current Outpatient Prescriptions  Medication Sig Dispense Refill  . albuterol (PROVENTIL HFA;VENTOLIN HFA) 108 (90 BASE) MCG/ACT inhaler Inhale 2 puffs into the lungs every 6 (six) hours as needed for wheezing or shortness of breath. 1 Inhaler 2  . collagenase (SANTYL) ointment Apply thin layer to wound bed of left foot daily 30 g 1  . glipiZIDE (GLUCOTROL) 10 MG tablet Take 10 mg by mouth 2 (two) times daily before a meal.    . ketoconazole (NIZORAL) 2 % cream Apply 1 application topically daily as needed for irritation.    . Multiple Vitamins-Minerals (MULTIVITAMIN WITH MINERALS) tablet Take 4 tablets by mouth 3 (three) times daily.    . nitroGLYCERIN (NITROSTAT) 0.4 MG SL tablet Place 0.4 mg under the tongue every 5 (five) minutes as needed for chest pain.    Marland Kitchen oxyCODONE (ROXICODONE) 15 MG immediate release tablet Take 3 tablets (45 mg total) by mouth every 6 (six) hours as needed (severe pain). 60 tablet 0  . PATADAY 0.2 % SOLN Place 1 drop into both eyes daily.   3  . polyethylene glycol (MIRALAX / GLYCOLAX) packet Take 17 g by mouth 2 (two) times daily.     . silver sulfADIAZINE (SILVADENE) 1 % cream Apply 1 application topically daily. 50 g 0  . triamcinolone cream (KENALOG) 0.1 % Apply 1 application topically 2 (two) times daily as needed (for eczema).    . zolpidem (AMBIEN) 10 MG tablet Take 10 mg by mouth at bedtime as needed for sleep.    Marland Kitchen aspirin EC 81 MG tablet Take 1 tablet (81 mg  total) by mouth daily. 90 tablet 3   No current facility-administered medications for this visit.    Allergies:   Zetia; Atorvastatin; Penicillins; and Pravastatin sodium   Social History:  The patient  reports that he has been smoking Cigarettes.  He has a 23 pack-year smoking history. He has never used smokeless tobacco. He reports that he drinks alcohol. He reports that he uses illicit drugs (Marijuana and Cocaine) about once per week.   Family History:  The patient's  family history includes Aneurysm in his father; Diabetes in his mother; Heart attack in his mother and sister; Heart disease in his brother; Hypertension in his mother.    ROS:  Please see the history of present illness.  Otherwise, review of systems is positive for  Weight loss, chills, muscle pain, easy bruising, constipation , sweating, leg pain.  All other  systems are reviewed and negative.    PHYSICAL EXAM: VS:  BP 128/90 mmHg  Pulse 48  Ht 6' 2.5" (1.892 m)  Wt 139 lb 12.8 oz (63.413 kg)  BMI 17.71 kg/m2 , BMI Body mass index is 17.71 kg/(m^2). GEN: Thin male, in no acute distress HEENT: normal Neck: no JVD, no masses. No carotid bruits Cardiac: bradycardic and regular without murmur or gallop  Respiratory:  clear to auscultation bilaterally, normal work of breathing GI: soft, nontender, nondistended, + BS MS: no deformity or atrophy Ext: no pretibial edema Skin: warm and dry, no rash Neuro:  Strength and sensation are intact Psych: euthymic mood, full affect  EKG:  EKG is ordered today. The ekg ordered today shows marked sinus bradycardia 48 bpm, otherwise within normal limits  Recent Labs: 07/26/2015: ALT 21 07/29/2015: BUN 6; Creatinine, Ser 0.68; Hemoglobin 12.7*; Platelets 120*; Potassium 3.7; Sodium 135   Lipid Panel     Component Value Date/Time   CHOL 271* 06/06/2013 0345   TRIG 125 06/06/2013 0345   HDL 39* 06/06/2013 0345   CHOLHDL 6.9 06/06/2013 0345   VLDL 25 06/06/2013 0345   LDLCALC  207* 06/06/2013 0345      Wt Readings from Last 3 Encounters:  11/06/15 139 lb 12.8 oz (63.413 kg)  10/24/15 141 lb (63.957 kg)  09/26/15 138 lb 6.4 oz (62.778 kg)    ASSESSMENT AND PLAN: 1.   CAD, native vessel, without angina: patient appears stable from a cardiac perspective. Unfortunately continues to smoke cigarettes. He's been counseled extensively and is not going to be able to quit. I asked him to start back on aspirin 81 mg daily. If he cannot tolerate this, I asked him to contact the office and we will switch him to Plavix 75 mg daily. I suspect he will be able to tolerate one or the other of these antiplatelet agents.  2. Hyperlipidemia: patient has not been able to take statin drugs or Zetia due to side effects. He doesn't have an interest in lipid-lowering medication at this time.  3. Type 2 diabetes, complicated by coronary and peripheral arterial disease : Followed by primary physician.   4. Peripheral arterial disease status post aortobifemoral bypass and more recently with left femoral to below-knee popliteal bypass for critical limb ischemia with tissue loss.  5. Sinus bradycardia: on no AV blocking agents. Asymptomatic - continue observation.  Current medicines are reviewed with the patient today.  The patient does not have concerns regarding medicines.  Labs/ tests ordered today include:   Orders Placed This Encounter  Procedures  . EKG 12-Lead    Disposition:   FU one year  Signed, Sherren Mocha, MD  11/06/2015 1:12 PM    [facility] [place], [place], [place]  [address] Phone: [phone]; Fax: [phone]

## 2015-11-06 NOTE — Patient Instructions (Addendum)
Medication Instructions:  Your physician has recommended you make the following change in your medication:  1. START Aspirin 81mg  take one tablet by mouth daily (enteric coated)  Labwork: No new orders.   Testing/Procedures: No new orders.   Follow-Up: Your physician wants you to follow-up in: 1 YEAR with Dr Burt Knack.  You will receive a reminder letter in the mail two months in advance. If you don't receive a letter, please call our office to schedule the follow-up appointment.   Any Other Special Instructions Will Be Listed Below (If Applicable).     If you need a refill on your cardiac medications before your next appointment, please call your pharmacy.

## 2015-11-08 ENCOUNTER — Encounter: Payer: Self-pay | Admitting: Vascular Surgery

## 2015-11-14 ENCOUNTER — Ambulatory Visit (INDEPENDENT_AMBULATORY_CARE_PROVIDER_SITE_OTHER): Payer: Medicaid Other | Admitting: Vascular Surgery

## 2015-11-14 ENCOUNTER — Encounter: Payer: Self-pay | Admitting: Vascular Surgery

## 2015-11-14 VITALS — BP 144/89 | HR 51 | Temp 97.8°F | Resp 16 | Ht 74.0 in | Wt 137.0 lb

## 2015-11-14 DIAGNOSIS — Z95828 Presence of other vascular implants and grafts: Secondary | ICD-10-CM

## 2015-11-14 DIAGNOSIS — I739 Peripheral vascular disease, unspecified: Secondary | ICD-10-CM

## 2015-11-14 NOTE — Progress Notes (Signed)
Here today for continued follow-up of his left foot. He continues to progress. He did undergo left femoral to below-knee popliteal bypass with saphenous vein on 07/28/2015. Can have excellent flow to his foot. He had an extremely large wound over the dorsum of his foot of her great toe. I debrided this eschar on my last visit. He continues to have a nice granulating base on the dorsum of his foot. The wound is still very large but is contracting. I did review the eschar off his great toe. This does appear to be a deeper with some necrotic debris at the base.   Will continue with local wound care. We'll see him again in 3 weeks for continued follow-up

## 2015-11-14 NOTE — Progress Notes (Signed)
Filed Vitals:   11/14/15 1459 11/14/15 1507  BP: 150/84 144/89  Pulse: 52 51  Temp: 97.8 F (36.6 C)   TempSrc: Oral   Resp: 16   Height: 6\' 2"  (1.88 m)   Weight: 137 lb (62.143 kg)   SpO2: 100%

## 2015-11-21 ENCOUNTER — Inpatient Hospital Stay (HOSPITAL_COMMUNITY)
Admission: EM | Admit: 2015-11-21 | Discharge: 2015-12-30 | DRG: 329 | Disposition: A | Discharge status: Skilled Nursing Facility | Payer: Medicaid Other | Attending: Internal Medicine | Admitting: Internal Medicine

## 2015-11-21 ENCOUNTER — Emergency Department (HOSPITAL_COMMUNITY): Payer: Medicaid Other

## 2015-11-21 ENCOUNTER — Inpatient Hospital Stay (HOSPITAL_COMMUNITY): Payer: Medicaid Other

## 2015-11-21 ENCOUNTER — Encounter (HOSPITAL_COMMUNITY): Payer: Self-pay | Admitting: Emergency Medicine

## 2015-11-21 DIAGNOSIS — Z8679 Personal history of other diseases of the circulatory system: Secondary | ICD-10-CM

## 2015-11-21 DIAGNOSIS — I82A19 Acute embolism and thrombosis of unspecified axillary vein: Secondary | ICD-10-CM | POA: Diagnosis present

## 2015-11-21 DIAGNOSIS — R6520 Severe sepsis without septic shock: Secondary | ICD-10-CM | POA: Diagnosis not present

## 2015-11-21 DIAGNOSIS — M1990 Unspecified osteoarthritis, unspecified site: Secondary | ICD-10-CM | POA: Diagnosis present

## 2015-11-21 DIAGNOSIS — D709 Neutropenia, unspecified: Secondary | ICD-10-CM | POA: Diagnosis present

## 2015-11-21 DIAGNOSIS — J189 Pneumonia, unspecified organism: Secondary | ICD-10-CM | POA: Diagnosis not present

## 2015-11-21 DIAGNOSIS — Z9911 Dependence on respirator [ventilator] status: Secondary | ICD-10-CM | POA: Diagnosis not present

## 2015-11-21 DIAGNOSIS — E1142 Type 2 diabetes mellitus with diabetic polyneuropathy: Secondary | ICD-10-CM | POA: Diagnosis present

## 2015-11-21 DIAGNOSIS — Z681 Body mass index (BMI) 19 or less, adult: Secondary | ICD-10-CM | POA: Diagnosis not present

## 2015-11-21 DIAGNOSIS — Z794 Long term (current) use of insulin: Secondary | ICD-10-CM

## 2015-11-21 DIAGNOSIS — E1159 Type 2 diabetes mellitus with other circulatory complications: Secondary | ICD-10-CM | POA: Diagnosis present

## 2015-11-21 DIAGNOSIS — D696 Thrombocytopenia, unspecified: Secondary | ICD-10-CM | POA: Diagnosis not present

## 2015-11-21 DIAGNOSIS — R109 Unspecified abdominal pain: Secondary | ICD-10-CM | POA: Diagnosis present

## 2015-11-21 DIAGNOSIS — I10 Essential (primary) hypertension: Secondary | ICD-10-CM | POA: Diagnosis present

## 2015-11-21 DIAGNOSIS — G8929 Other chronic pain: Secondary | ICD-10-CM | POA: Diagnosis present

## 2015-11-21 DIAGNOSIS — Z515 Encounter for palliative care: Secondary | ICD-10-CM | POA: Diagnosis present

## 2015-11-21 DIAGNOSIS — F17210 Nicotine dependence, cigarettes, uncomplicated: Secondary | ICD-10-CM | POA: Diagnosis present

## 2015-11-21 DIAGNOSIS — L0291 Cutaneous abscess, unspecified: Secondary | ICD-10-CM

## 2015-11-21 DIAGNOSIS — L97529 Non-pressure chronic ulcer of other part of left foot with unspecified severity: Secondary | ICD-10-CM | POA: Diagnosis present

## 2015-11-21 DIAGNOSIS — Z7982 Long term (current) use of aspirin: Secondary | ICD-10-CM

## 2015-11-21 DIAGNOSIS — K5660 Unspecified intestinal obstruction: Secondary | ICD-10-CM | POA: Diagnosis present

## 2015-11-21 DIAGNOSIS — Z720 Tobacco use: Secondary | ICD-10-CM | POA: Diagnosis not present

## 2015-11-21 DIAGNOSIS — K565 Intestinal adhesions [bands] with obstruction (postprocedural) (postinfection): Secondary | ICD-10-CM | POA: Diagnosis present

## 2015-11-21 DIAGNOSIS — Z888 Allergy status to other drugs, medicaments and biological substances status: Secondary | ICD-10-CM | POA: Diagnosis not present

## 2015-11-21 DIAGNOSIS — J9811 Atelectasis: Secondary | ICD-10-CM | POA: Diagnosis not present

## 2015-11-21 DIAGNOSIS — I252 Old myocardial infarction: Secondary | ICD-10-CM | POA: Diagnosis not present

## 2015-11-21 DIAGNOSIS — Z452 Encounter for adjustment and management of vascular access device: Secondary | ICD-10-CM

## 2015-11-21 DIAGNOSIS — I739 Peripheral vascular disease, unspecified: Secondary | ICD-10-CM | POA: Diagnosis present

## 2015-11-21 DIAGNOSIS — K659 Peritonitis, unspecified: Secondary | ICD-10-CM | POA: Diagnosis not present

## 2015-11-21 DIAGNOSIS — I82A11 Acute embolism and thrombosis of right axillary vein: Secondary | ICD-10-CM | POA: Diagnosis present

## 2015-11-21 DIAGNOSIS — Z9889 Other specified postprocedural states: Secondary | ICD-10-CM | POA: Diagnosis not present

## 2015-11-21 DIAGNOSIS — K567 Ileus, unspecified: Secondary | ICD-10-CM | POA: Diagnosis present

## 2015-11-21 DIAGNOSIS — R001 Bradycardia, unspecified: Secondary | ICD-10-CM | POA: Diagnosis present

## 2015-11-21 DIAGNOSIS — R609 Edema, unspecified: Secondary | ICD-10-CM

## 2015-11-21 DIAGNOSIS — G9340 Encephalopathy, unspecified: Secondary | ICD-10-CM | POA: Diagnosis not present

## 2015-11-21 DIAGNOSIS — K651 Peritoneal abscess: Secondary | ICD-10-CM

## 2015-11-21 DIAGNOSIS — D638 Anemia in other chronic diseases classified elsewhere: Secondary | ICD-10-CM | POA: Diagnosis not present

## 2015-11-21 DIAGNOSIS — I808 Phlebitis and thrombophlebitis of other sites: Secondary | ICD-10-CM | POA: Diagnosis not present

## 2015-11-21 DIAGNOSIS — I2510 Atherosclerotic heart disease of native coronary artery without angina pectoris: Secondary | ICD-10-CM | POA: Diagnosis present

## 2015-11-21 DIAGNOSIS — Z880 Allergy status to penicillin: Secondary | ICD-10-CM

## 2015-11-21 DIAGNOSIS — Z87442 Personal history of urinary calculi: Secondary | ICD-10-CM | POA: Diagnosis not present

## 2015-11-21 DIAGNOSIS — I248 Other forms of acute ischemic heart disease: Secondary | ICD-10-CM | POA: Diagnosis not present

## 2015-11-21 DIAGNOSIS — A419 Sepsis, unspecified organism: Secondary | ICD-10-CM | POA: Diagnosis not present

## 2015-11-21 DIAGNOSIS — J90 Pleural effusion, not elsewhere classified: Secondary | ICD-10-CM | POA: Diagnosis present

## 2015-11-21 DIAGNOSIS — K5669 Other intestinal obstruction: Secondary | ICD-10-CM | POA: Diagnosis not present

## 2015-11-21 DIAGNOSIS — G40409 Other generalized epilepsy and epileptic syndromes, not intractable, without status epilepticus: Secondary | ICD-10-CM | POA: Diagnosis present

## 2015-11-21 DIAGNOSIS — K650 Generalized (acute) peritonitis: Secondary | ICD-10-CM | POA: Diagnosis present

## 2015-11-21 DIAGNOSIS — I255 Ischemic cardiomyopathy: Secondary | ICD-10-CM | POA: Diagnosis present

## 2015-11-21 DIAGNOSIS — F1290 Cannabis use, unspecified, uncomplicated: Secondary | ICD-10-CM | POA: Diagnosis present

## 2015-11-21 DIAGNOSIS — Z7984 Long term (current) use of oral hypoglycemic drugs: Secondary | ICD-10-CM | POA: Diagnosis not present

## 2015-11-21 DIAGNOSIS — E43 Unspecified severe protein-calorie malnutrition: Secondary | ICD-10-CM | POA: Diagnosis present

## 2015-11-21 DIAGNOSIS — Z8249 Family history of ischemic heart disease and other diseases of the circulatory system: Secondary | ICD-10-CM | POA: Diagnosis not present

## 2015-11-21 DIAGNOSIS — Z79899 Other long term (current) drug therapy: Secondary | ICD-10-CM

## 2015-11-21 DIAGNOSIS — Z955 Presence of coronary angioplasty implant and graft: Secondary | ICD-10-CM | POA: Diagnosis not present

## 2015-11-21 DIAGNOSIS — J95821 Acute postprocedural respiratory failure: Secondary | ICD-10-CM | POA: Diagnosis not present

## 2015-11-21 DIAGNOSIS — D72829 Elevated white blood cell count, unspecified: Secondary | ICD-10-CM | POA: Diagnosis present

## 2015-11-21 DIAGNOSIS — R188 Other ascites: Secondary | ICD-10-CM | POA: Diagnosis present

## 2015-11-21 DIAGNOSIS — R0902 Hypoxemia: Secondary | ICD-10-CM

## 2015-11-21 DIAGNOSIS — K56609 Unspecified intestinal obstruction, unspecified as to partial versus complete obstruction: Secondary | ICD-10-CM

## 2015-11-21 DIAGNOSIS — N179 Acute kidney failure, unspecified: Secondary | ICD-10-CM | POA: Diagnosis present

## 2015-11-21 DIAGNOSIS — E119 Type 2 diabetes mellitus without complications: Secondary | ICD-10-CM | POA: Diagnosis not present

## 2015-11-21 DIAGNOSIS — R809 Proteinuria, unspecified: Secondary | ICD-10-CM | POA: Diagnosis present

## 2015-11-21 DIAGNOSIS — Z79891 Long term (current) use of opiate analgesic: Secondary | ICD-10-CM | POA: Diagnosis not present

## 2015-11-21 DIAGNOSIS — K631 Perforation of intestine (nontraumatic): Secondary | ICD-10-CM | POA: Diagnosis present

## 2015-11-21 DIAGNOSIS — E785 Hyperlipidemia, unspecified: Secondary | ICD-10-CM | POA: Diagnosis present

## 2015-11-21 DIAGNOSIS — E11621 Type 2 diabetes mellitus with foot ulcer: Secondary | ICD-10-CM | POA: Diagnosis present

## 2015-11-21 DIAGNOSIS — Z9119 Patient's noncompliance with other medical treatment and regimen: Secondary | ICD-10-CM | POA: Diagnosis not present

## 2015-11-21 DIAGNOSIS — R509 Fever, unspecified: Secondary | ICD-10-CM

## 2015-11-21 DIAGNOSIS — Z833 Family history of diabetes mellitus: Secondary | ICD-10-CM | POA: Diagnosis not present

## 2015-11-21 DIAGNOSIS — I82621 Acute embolism and thrombosis of deep veins of right upper extremity: Secondary | ICD-10-CM | POA: Diagnosis not present

## 2015-11-21 DIAGNOSIS — R569 Unspecified convulsions: Secondary | ICD-10-CM

## 2015-11-21 DIAGNOSIS — J9601 Acute respiratory failure with hypoxia: Secondary | ICD-10-CM | POA: Diagnosis not present

## 2015-11-21 DIAGNOSIS — D62 Acute posthemorrhagic anemia: Secondary | ICD-10-CM | POA: Diagnosis not present

## 2015-11-21 DIAGNOSIS — R1011 Right upper quadrant pain: Secondary | ICD-10-CM | POA: Diagnosis not present

## 2015-11-21 DIAGNOSIS — R410 Disorientation, unspecified: Secondary | ICD-10-CM | POA: Diagnosis not present

## 2015-11-21 DIAGNOSIS — E46 Unspecified protein-calorie malnutrition: Secondary | ICD-10-CM | POA: Diagnosis present

## 2015-11-21 DIAGNOSIS — K9189 Other postprocedural complications and disorders of digestive system: Secondary | ICD-10-CM | POA: Diagnosis present

## 2015-11-21 DIAGNOSIS — R1013 Epigastric pain: Secondary | ICD-10-CM | POA: Diagnosis not present

## 2015-11-21 DIAGNOSIS — R451 Restlessness and agitation: Secondary | ICD-10-CM | POA: Diagnosis not present

## 2015-11-21 DIAGNOSIS — Z4659 Encounter for fitting and adjustment of other gastrointestinal appliance and device: Secondary | ICD-10-CM

## 2015-11-21 DIAGNOSIS — Z978 Presence of other specified devices: Secondary | ICD-10-CM

## 2015-11-21 DIAGNOSIS — J9600 Acute respiratory failure, unspecified whether with hypoxia or hypercapnia: Secondary | ICD-10-CM

## 2015-11-21 DIAGNOSIS — R6510 Systemic inflammatory response syndrome (SIRS) of non-infectious origin without acute organ dysfunction: Secondary | ICD-10-CM

## 2015-11-21 DIAGNOSIS — I82401 Acute embolism and thrombosis of unspecified deep veins of right lower extremity: Secondary | ICD-10-CM

## 2015-11-21 LAB — URINE MICROSCOPIC-ADD ON

## 2015-11-21 LAB — CBC
HCT: 47 % (ref 39.0–52.0)
HEMOGLOBIN: 15.7 g/dL (ref 13.0–17.0)
MCH: 29.6 pg (ref 26.0–34.0)
MCHC: 33.4 g/dL (ref 30.0–36.0)
MCV: 88.5 fL (ref 78.0–100.0)
PLATELETS: ADEQUATE 10*3/uL (ref 150–400)
RBC: 5.31 MIL/uL (ref 4.22–5.81)
RDW: 14.7 % (ref 11.5–15.5)
WBC: 9.9 10*3/uL (ref 4.0–10.5)

## 2015-11-21 LAB — COMPREHENSIVE METABOLIC PANEL
ALBUMIN: 4.7 g/dL (ref 3.5–5.0)
ALK PHOS: 66 U/L (ref 38–126)
ALT: 32 U/L (ref 17–63)
AST: 36 U/L (ref 15–41)
Anion gap: 12 (ref 5–15)
BUN: 13 mg/dL (ref 6–20)
CALCIUM: 10.3 mg/dL (ref 8.9–10.3)
CHLORIDE: 97 mmol/L — AB (ref 101–111)
CO2: 30 mmol/L (ref 22–32)
CREATININE: 0.74 mg/dL (ref 0.61–1.24)
GFR calc Af Amer: >60 mL/min (ref >60)
GFR calc non Af Amer: >60 mL/min (ref >60)
GLUCOSE: 232 mg/dL — AB (ref 65–99)
Potassium: 3.7 mmol/L (ref 3.5–5.1)
SODIUM: 139 mmol/L (ref 135–145)
Total Bilirubin: 1.1 mg/dL (ref 0.3–1.2)
Total Protein: 7.7 g/dL (ref 6.5–8.1)

## 2015-11-21 LAB — URINALYSIS, ROUTINE W REFLEX MICROSCOPIC
Glucose, UA: 100 mg/dL — AB
Ketones, ur: NEGATIVE mg/dL
LEUKOCYTES UA: NEGATIVE
Nitrite: NEGATIVE
Specific Gravity, Urine: 1.03 (ref 1.005–1.030)
pH: 6 (ref 5.0–8.0)

## 2015-11-21 LAB — I-STAT CG4 LACTIC ACID, ED: Lactic Acid, Venous: 2.99 mmol/L (ref 0.5–2.0)

## 2015-11-21 LAB — I-STAT TROPONIN, ED: TROPONIN I, POC: 0 ng/mL (ref 0.00–0.08)

## 2015-11-21 LAB — LIPASE, BLOOD: LIPASE: 21 U/L (ref 11–51)

## 2015-11-21 MED ORDER — POTASSIUM CHLORIDE IN NACL 20-0.9 MEQ/L-% IV SOLN
INTRAVENOUS | Status: DC
  Administered 2015-11-21 – 2015-11-23 (×3): via INTRAVENOUS
  Filled 2015-11-21 (×5): qty 1000

## 2015-11-21 MED ORDER — HYDROMORPHONE HCL 2 MG/ML IJ SOLN
2.0000 mg | Freq: Once | INTRAMUSCULAR | Status: AC
  Administered 2015-11-21: 2 mg via INTRAVENOUS
  Filled 2015-11-21: qty 1

## 2015-11-21 MED ORDER — HYDROMORPHONE HCL 2 MG/ML IJ SOLN
2.0000 mg | INTRAMUSCULAR | Status: DC | PRN
  Administered 2015-11-22 (×2): 2 mg via INTRAVENOUS
  Filled 2015-11-21 (×2): qty 1

## 2015-11-21 MED ORDER — ONDANSETRON HCL 4 MG/2ML IJ SOLN
4.0000 mg | Freq: Three times a day (TID) | INTRAMUSCULAR | Status: DC | PRN
  Administered 2015-11-21: 4 mg via INTRAVENOUS
  Filled 2015-11-21: qty 2

## 2015-11-21 MED ORDER — KETOROLAC TROMETHAMINE 30 MG/ML IJ SOLN
30.0000 mg | Freq: Once | INTRAMUSCULAR | Status: AC
  Administered 2015-11-21: 30 mg via INTRAVENOUS
  Filled 2015-11-21: qty 1

## 2015-11-21 MED ORDER — HYDROMORPHONE HCL 1 MG/ML IJ SOLN
1.0000 mg | Freq: Once | INTRAMUSCULAR | Status: AC
  Administered 2015-11-21: 1 mg via INTRAVENOUS
  Filled 2015-11-21: qty 1

## 2015-11-21 MED ORDER — PANTOPRAZOLE SODIUM 40 MG IV SOLR
40.0000 mg | INTRAVENOUS | Status: DC
  Administered 2015-11-21 – 2015-11-22 (×2): 40 mg via INTRAVENOUS
  Filled 2015-11-21 (×2): qty 40

## 2015-11-21 MED ORDER — MORPHINE SULFATE (PF) 2 MG/ML IV SOLN: 2.0000 mg | Freq: Once | INTRAVENOUS | Status: DC

## 2015-11-21 MED ORDER — SODIUM CHLORIDE 0.9 % IV BOLUS (SEPSIS)
1000.0000 mL | Freq: Once | INTRAVENOUS | Status: AC
  Administered 2015-11-21: 1000 mL via INTRAVENOUS

## 2015-11-21 MED ORDER — ONDANSETRON HCL 4 MG/2ML IJ SOLN
4.0000 mg | Freq: Four times a day (QID) | INTRAMUSCULAR | Status: AC | PRN
  Administered 2015-11-22: 4 mg via INTRAVENOUS
  Filled 2015-11-21: qty 2

## 2015-11-21 MED ORDER — SODIUM CHLORIDE 0.9 % IV SOLN
INTRAVENOUS | Status: AC
  Administered 2015-11-21: 22:00:00 via INTRAVENOUS

## 2015-11-21 MED ORDER — MORPHINE SULFATE (PF) 4 MG/ML IV SOLN
4.0000 mg | Freq: Once | INTRAVENOUS | Status: AC
  Administered 2015-11-21: 4 mg via INTRAVENOUS
  Filled 2015-11-21: qty 1

## 2015-11-21 MED ORDER — MORPHINE SULFATE (PF) 2 MG/ML IV SOLN
2.0000 mg | Freq: Once | INTRAVENOUS | Status: AC
  Administered 2015-11-21: 2 mg via INTRAVENOUS
  Filled 2015-11-21: qty 1

## 2015-11-21 MED ORDER — IOHEXOL 300 MG/ML  SOLN
100.0000 mL | Freq: Once | INTRAMUSCULAR | Status: AC | PRN
  Administered 2015-11-21: 100 mL via INTRAVENOUS

## 2015-11-21 MED ORDER — SODIUM CHLORIDE 0.9 % IJ SOLN
3.0000 mL | Freq: Two times a day (BID) | INTRAMUSCULAR | Status: DC
Start: 2015-11-21 — End: 2015-12-30
  Administered 2015-11-21 – 2015-12-29 (×39): 3 mL via INTRAVENOUS

## 2015-11-21 MED ORDER — HYDROMORPHONE HCL 1 MG/ML IJ SOLN
1.0000 mg | INTRAMUSCULAR | Status: DC | PRN
  Administered 2015-11-21: 1 mg via INTRAVENOUS
  Filled 2015-11-21: qty 1

## 2015-11-21 MED ORDER — IOHEXOL 300 MG/ML  SOLN
25.0000 mL | Freq: Once | INTRAMUSCULAR | Status: AC | PRN
  Administered 2015-11-21: 25 mL via ORAL

## 2015-11-21 MED ORDER — ONDANSETRON HCL 4 MG/2ML IJ SOLN
4.0000 mg | Freq: Once | INTRAMUSCULAR | Status: AC
  Administered 2015-11-21: 4 mg via INTRAVENOUS
  Filled 2015-11-21: qty 2

## 2015-11-21 NOTE — ED Notes (Signed)
Pt reported that he was feeling worse in the lobby. Pt moved to triage 1 complaining of weakness. EKG done and placed on the monitor.

## 2015-11-21 NOTE — ED Notes (Signed)
Pt alert and oriented x4. Respirations even and unlabored, bilateral symmetrical rise and fall of chest. Skin warm and dry. In no acute distress. Denies needs.   

## 2015-11-21 NOTE — ED Notes (Signed)
Patient reports that he has an opiate problem and it takes "a lot of pain medication " to work for him.

## 2015-11-21 NOTE — ED Notes (Signed)
PA at bedside.

## 2015-11-21 NOTE — ED Notes (Signed)
Pt back from x-ray.

## 2015-11-21 NOTE — ED Notes (Signed)
PA at bedside  Pt alert and oriented x4. Respirations even and unlabored, bilateral symmetrical rise and fall of chest. Skin warm and dry.

## 2015-11-21 NOTE — ED Notes (Signed)
Pt keeps yelling, "I'm in pain, pain for 8 hours". Hitting the side rail. Writhing back and forth in bed. Pt stood on his own, walked to door and called for nurse to get his urine sample.

## 2015-11-21 NOTE — ED Provider Notes (Signed)
CSN: [id number]     Arrival date & time 11/21/15  1343 History   First MD Initiated Contact with Patient 11/21/15 1545     Chief Complaint  Patient presents with  . Abdominal Pain    HPI  [name] [name] is an 64 y.o. male with history of HTN, HLD, PVD, CAD, cardiomyopathy, DM, chronic pain, who presents to the ED for evaluation of abdominal pain. He states his pain started this AM around 8:00. He describes the pain as diffuse pain all over. He is not cooperative in the ED. He is writhing, moaning in bed and screaming "Oh God." On my initial exam pt was markedly diffusely tender across his entire abdomen. However, on re-eval with distraction he exhibits no tenderness, guarding, or rigidity. WHen I tried to placate pt and calm him down he states that he is hurting all over and thinks he might be constipated, but then reports he has been having normal BM with last BM yesterday. Endorses nausea but denies emesis or diarrhea. He states he has oxycodone at home for chronic pain and took one dose this morning which initially helped his pain but now is wearing off. Denies fever, chills, chest pain, SOB.  Pt is bradycardic in the ED but review of EMR shows that this is baseline.   Past Medical History  Diagnosis Date  . Hyperlipidemia   . Hypertension   . Leg pain   . Peripheral vascular disease ([place])     s/p prior Ao-Bifem BPG  . 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 Coronary artery disease     a. NSTEMI 7/14=> LHC 06/07/13: Proximal LAD 30%, ostial D1 30%, AV circumflex 80%, then occluded, ostial OM2 occluded, left to left collaterals, mid RCA 95%, inferior HK, EF 60%. PCI: Promus DES to the mid RCA.  No intervention was recommended for the CTO circumflex.  . Ischemic cardiomyopathy     a. Echocardiogram 06/06/13: EF 35-40%, inferior AK, mild MR.  Marland Kitchen Shortness of breath     "can come on at anytime lately; before that it was just w/exertion"  (11/10/2013)  . Type II diabetes mellitus ([place])   . 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 Arthritis     "neck, back, legs" (11/10/2013)  . Kidney stones     "when I was younger; passed w/o treatment" (11/10/2013)  . Myocardial infarction ([place])   . Diabetic neuropathy ([place])   . H. pylori infection   . Wears glasses    Past Surgical History  Procedure Laterality Date  . Iliac artery stent  05/06/2011  . Tonsillectomy    . Aorta - bilateral femoral artery bypass graft  11/25/2011    Procedure: AORTA BIFEMORAL BYPASS GRAFT;  Surgeon: Rosetta Posner, MD;  Location: [place];  Service: Vascular;  Laterality: N/A;  . Pr vein bypass graft,aorto-fem-pop  11/24/2012  . Inguinal hernia repair Right   . Abdominal aortagram N/A 11/11/2011    Procedure: ABDOMINAL Maxcine Ham;  Surgeon: Angelia Mould, MD;  Location[facility] CATH LAB;  Service: Cardiovascular;  Laterality: N/A;  . Lower extremity angiogram Bilateral 11/11/2011    Procedure: LOWER EXTREMITY ANGIOGRAM;  Surgeon: Angelia Mould, MD;  Location: [facility] CATH LAB;  Service: Cardiovascular;  Laterality: Bilateral;  . Left heart catheterization with coronary angiogram N/A 06/07/2013    Procedure: LEFT HEART CATHETERIZATION  WITH CORONARY ANGIOGRAM;  Surgeon: Sherren Mocha, MD;  Location: [facility] CATH LAB;  Service: Cardiovascular;  Laterality: N/A;  . Percutaneous coronary stent intervention (pci-s) Right 06/07/2013    Procedure: PERCUTANEOUS CORONARY STENT INTERVENTION (PCI-S);  Surgeon: Sherren Mocha, MD;  Location: [facility] CATH LAB;  Service: Cardiovascular;  Laterality: Right;  . Left heart catheterization with coronary angiogram N/A 12/10/2013    Procedure: LEFT HEART CATHETERIZATION WITH CORONARY ANGIOGRAM;  Surgeon: Blane Ohara, MD;  Location: [facility] CATH LAB;  Service: Cardiovascular;  Laterality: N/A;  . Colonoscopy    . Peripheral vascular catheterization N/A 07/26/2015    Procedure:  Abdominal Aortogram;  Surgeon: Serafina Mitchell, MD;  Location: [place] CV LAB;  Service: Cardiovascular;  Laterality: N/A;  . Peripheral vascular catheterization Bilateral 07/26/2015    Procedure: Lower Extremity Angiography;  Surgeon: Serafina Mitchell, MD;  Location: [place] CV LAB;  Service: Cardiovascular;  Laterality: Bilateral;  . Femoral-popliteal bypass graft Left 07/28/2015    Procedure: LEFT FEMORAL-POPLITEAL ARTERY BYPASS GRAFT;  Surgeon: Rosetta Posner, MD;  Location: [facility] OR;  Service: Vascular;  Laterality: Left;   Family History  Problem Relation Age of Onset  . Hypertension Mother   . Heart attack Mother   . Diabetes Mother   . Aneurysm Father   . Heart attack Sister   . Heart disease Brother     heart transplant   Social History  Substance Use Topics  . Smoking status: Current Every Day Smoker -- 0.50 packs/day for 46 years    Types: Cigarettes  . Smokeless tobacco: Never Used  . Alcohol Use: 0.0 oz/week    0 Standard drinks or equivalent per week     Comment: [id number] "last drink was 3-4 yr ago; never had problem w/it"    Review of Systems  All other systems reviewed and are negative.     Allergies  Zetia; Atorvastatin; Penicillins; and Pravastatin sodium  Home Medications   Prior to Admission medications   Medication Sig Start Date End Date Taking? Authorizing Provider  albuterol (PROVENTIL HFA;VENTOLIN HFA) 108 (90 BASE) MCG/ACT inhaler Inhale 2 puffs into the lungs every 6 (six) hours as needed for wheezing or shortness of breath. 11/11/13  Yes Ripudeep Krystal Eaton, MD  aspirin EC 81 MG tablet Take 1 tablet (81 mg total) by mouth daily. 11/06/15  Yes Sherren Mocha, MD  collagenase (SANTYL) ointment Apply thin layer to wound bed of left foot daily Patient taking differently: Apply 1 application topically daily. Apply thin layer to wound bed of left foot daily 10/26/15  Yes Rosetta Posner, MD  gabapentin (NEURONTIN) 300 MG capsule Take 300 mg by mouth 3  (three) times daily. 09/30/15  Yes Historical Provider, MD  glipiZIDE (GLUCOTROL) 10 MG tablet Take 10 mg by mouth 2 (two) times daily before a meal.   Yes Historical Provider, MD  ketoconazole (NIZORAL) 2 % cream Apply 1 application topically daily as needed for irritation.   Yes Historical Provider, MD  Multiple Vitamins-Minerals (MULTIVITAMIN WITH MINERALS) tablet Take 4 tablets by mouth 3 (three) times daily.   Yes Historical Provider, MD  nitroGLYCERIN (NITROSTAT) 0.4 MG SL tablet Place 0.4 mg under the tongue every 5 (five) minutes as needed for chest pain.   Yes Historical Provider, MD  oxyCODONE (ROXICODONE) 15 MG immediate release tablet Take 3 tablets (45 mg total) by mouth every 6 (six) hours as needed (severe pain). 07/30/15  Yes Kimberly A Trinh, PA-C  PATADAY 0.2 % SOLN Place 1  drop into both eyes daily.  08/11/14  Yes Historical Provider, MD  polyethylene glycol (MIRALAX / GLYCOLAX) packet Take 17 g by mouth daily as needed for mild constipation or moderate constipation.    Yes Historical Provider, MD  triamcinolone cream (KENALOG) 0.1 % Apply 1 application topically 2 (two) times daily as needed (for eczema).   Yes Historical Provider, MD  zolpidem (AMBIEN) 10 MG tablet Take 10 mg by mouth at bedtime as needed for sleep.   Yes Historical Provider, MD  silver sulfADIAZINE (SILVADENE) 1 % cream Apply 1 application topically daily. Patient not taking: Reported on 11/14/2015 07/25/15   Arvilla Meres Early, MD   BP 160/98 mmHg  Pulse 54  Temp(Src) 97.7 F (36.5 C) (Oral)  Resp 18  SpO2 98% Physical Exam  Constitutional: He is oriented to person, place, and time.  Moaning, screaming in the room.   HENT:  Right Ear: External ear normal.  Left Ear: External ear normal.  Nose: Nose normal.  Eyes: Conjunctivae are normal. No scleral icterus.  Neck: Normal range of motion. Neck supple.  Cardiovascular: Normal rate, regular rhythm, normal heart sounds and intact distal pulses.   Pulmonary/Chest:  Effort normal. No respiratory distress.  Abdominal: Soft. Bowel sounds are normal.  Abdomen is mildly distended. On initial exam pt would scream whenever i touched his abdomen with guarding. However, on re-eval with distraction pt has no tenderness, rigidity, guarding.  Musculoskeletal: He exhibits no edema.  Neurological: He is alert and oriented to person, place, and time. No cranial nerve deficit.  Skin: Skin is warm and dry.  Psychiatric: He is agitated.  Nursing note and vitals reviewed.   ED Course  Procedures (including critical care time) Labs Review Labs Reviewed  COMPREHENSIVE METABOLIC PANEL - Abnormal; Notable for the following:    Chloride 97 (*)    Glucose, Bld 232 (*)    All other components within normal limits  URINALYSIS, ROUTINE W REFLEX MICROSCOPIC (NOT AT [facility]) - Abnormal; Notable for the following:    Color, Urine AMBER (*)    Glucose, UA 100 (*)    Hgb urine dipstick SMALL (*)    Bilirubin Urine SMALL (*)    Protein, ur >300 (*)    All other components within normal limits  URINE MICROSCOPIC-ADD ON - Abnormal; Notable for the following:    Squamous Epithelial / LPF 0-5 (*)    Bacteria, UA FEW (*)    Casts GRANULAR CAST (*)    All other components within normal limits  I-STAT CG4 LACTIC ACID, ED - Abnormal; Notable for the following:    Lactic Acid, Venous 2.99 (*)    All other components within normal limits  LIPASE, BLOOD  CBC  I-STAT TROPOININ, ED    Imaging Review Ct Abdomen Pelvis W Contrast  11/21/2015  CLINICAL DATA:  Mid abdominal pain beginning this morning. Distension in right lower quadrant. Withdrawing from oxycodone. Small bowel obstruction. EXAM: CT ABDOMEN AND PELVIS WITH CONTRAST TECHNIQUE: Multidetector CT imaging of the abdomen and pelvis was performed using the standard protocol following bolus administration of intravenous contrast. CONTRAST:  44mL OMNIPAQUE IOHEXOL 300 MG/ML SOLN, 183mL OMNIPAQUE IOHEXOL 300 MG/ML SOLN COMPARISON:   11/21/2015 plain films.  CT of 10/18/2014. FINDINGS: Lower chest: Bibasilar atelectasis. Normal heart size without pericardial or pleural effusion. Right coronary artery stent. Hepatobiliary: Probable perfusion anomaly in the high right hepatic lobe on image 14/series 2, similar. A too small to characterize lesion in the left hepatic lobe on image  16/series 2. Normal gallbladder, without biliary ductal dilatation. Pancreas: Mild pancreatic atrophy, without duct dilatation or dominant mass. Spleen: Normal in size, without focal abnormality. Adrenals/Urinary Tract: Normal adrenal glands. Bilateral fluid density renal lesions, most consistent with cysts. An inter/lower polar right renal lesion measures 5.3 x 5.8 cm including on image 42/series 2. This has a perceptible peripheral wall, suggesting complexity. Is however, decreased in size since the 2011 exam, favoring a benign etiology. No hydronephrosis. Normal urinary bladder. Stomach/Bowel: Mild to moderate gastric distention. Distal colon is diminutive, likely decompressed. The proximal colon is stool-filled. Appendix is not confidently identified. Terminal ileum is decompressed on image 56 of series 2. Proximal small bowel loops are mildly dilated and fluid-filled, including at 3.3 cm on image 60/series 2. Transition point is identified within the left hemipelvis, involving distal small bowel. Example image 58/series 2. No pneumatosis or free intraperitoneal air. No bowel wall thickening to suggest complicating ischemia. Vascular/Lymphatic: Status post aortic to femoral bypass. Celiac and superior mesenteric artery both patent. 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 No abdominopelvic adenopathy. Reproductive: Normal prostate.  Small bilateral hydroceles. Other: Small volume abdominal pelvic ascites. Musculoskeletal: Left iliac bone island. Advanced degenerative disc disease at L5-S1. IMPRESSION: 1. Partial small bowel  obstruction. Transition point identified within the distal small bowel, likely secondary to adhesions. 2. Although no specific evidence of complicating ischemia is identified, there is new small volume abdominal pelvic ascites, nonspecific. 3. Bilateral hydroceles. Electronically Signed   By: Abigail Miyamoto M.D.   On: 11/21/2015 19:03   Dg Abd Acute W/chest  11/21/2015  CLINICAL DATA:  64 year old presenting with severe acute superimposed upon chronic intermittent generalized abdominal pain, associated with nausea and vomiting. EXAM: DG ABDOMEN ACUTE W/ 1V CHEST COMPARISON:  CT abdomen and pelvis 10/18/2014. Chest x-rays 01/17/2015 and earlier. FINDINGS: Multiple dilated loops of small bowel throughout the abdomen and pelvis demonstrating air-fluid levels on the erect image. Air-fluid level in the mildly distended and relatively featureless cecum and proximal ascending colon. Gas throughout otherwise normal caliber colon to the rectum. No evidence of free air on the lateral decubitus image. Native abdominal aortic and left common iliac and external iliac artery stents, though the patient has undergone a prior aortobifemoral bypass graft procedure as noted on the prior CT. Cardiac silhouette mildly enlarged, unchanged allowing for differences in technique. Lungs emphysematous though clear. No pleural effusions. IMPRESSION: 1. Small bowel obstruction.  No free intraperitoneal air. 2. Possible ascending colitis. 3. Mild cardiomegaly. COPD/emphysema. No acute cardiopulmonary disease. Electronically Signed   By: Evangeline Dakin M.D.   On: 11/21/2015 16:57   I have personally reviewed and evaluated these images and lab results as part of my medical decision-making.   EKG Interpretation None      MDM   Final diagnoses:  SBO (small bowel obstruction) ([place])    Pt is bradycardic but EMR review reveals that he has been bradycardic at baseline. Pt is agitated in the ED and his history is not very clear to me.  His exam is also unclear as he was initially quite tender with guarding but on my reassessment he was not tender with no guarding. Difficult to discern if abdomen is peritoneal. Given pt's agitation and lack of cooperation I will start with labs and acute abdomen XR, will hold off on CT now. Will give morphine, toradol, zofran, and NS bolus.  Pt with SBO on acute abdomen XR with possible colitis. Pt reports much improvement in pain and is  now more amenable to exam. His belly is soft though diffusely tender. Will order lactic acid and CT abd/pelvis.  Lactic acid 2.99. CT shows partial SBO, transition point visualized. Adhesions likely 2/2 aortic bypass surgery. I spoke to Dr. Olevia Bowens for medicine admission. Surgery was also consulted.     Anne Ng, PA-C 11/22/15 [id number]  Quintella Reichert, MD 11/23/15 [phone]

## 2015-11-21 NOTE — ED Notes (Signed)
UNABLE TO COLLECT LABS BECAUSE OF PATIENT BEHAVIOR.  PATIENT WILL NOT BE STILL.  I MADE THE NURSE AWARE.

## 2015-11-21 NOTE — ED Notes (Signed)
Pt to CT

## 2015-11-21 NOTE — ED Notes (Signed)
Per EMS. Pt began to have abd pain that began this am. Also noticed distention in RLQ. Pt also told EMS he was withdrawing from oxycodone.

## 2015-11-21 NOTE — ED Notes (Signed)
Pt keeps yelling, talking about how "Muslims go to paradise if they die of abdominal pain, because the pain is so great". Pt keeps screaming "Oh God".

## 2015-11-21 NOTE — Consult Note (Signed)
Reason for Consult:Abd pain, nausea Referring Physician: Dr Luella Cook is an 64 y.o. male.  HPI: Pt presents to the ED for evaluation of abdominal pain. He states his pain started this AM around 8:00. He describes the pain as diffuse pain all over. He reports he has been having normal BM's with last BM yesterday. Endorses nausea but denies emesis or diarrhea. He states he has oxycodone at home for chronic pain.  Denies fever, chills, chest pain, SOB.  Surgical history significant for open AAA repair.  Past Medical History  Diagnosis Date  . Hyperlipidemia   . Hypertension   . Leg pain   . Peripheral vascular disease ([place])     s/p prior Ao-Bifem BPG  . 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 Coronary artery disease     a. NSTEMI 7/14=> LHC 06/07/13: Proximal LAD 30%, ostial D1 30%, AV circumflex 80%, then occluded, ostial OM2 occluded, left to left collaterals, mid RCA 95%, inferior HK, EF 60%. PCI: Promus DES to the mid RCA.  No intervention was recommended for the CTO circumflex.  . Ischemic cardiomyopathy     a. Echocardiogram 06/06/13: EF 35-40%, inferior AK, mild MR.  Marland Kitchen Shortness of breath     "can come on at anytime lately; before that it was just w/exertion" (11/10/2013)  . Type II diabetes mellitus ([place])   . [place] mal seizure [facility])     "last one was ~ 3 yr ago; controlled w/daily RX" (11/10/2013)  . Arthritis     "neck, back, legs" (11/10/2013)  . Kidney stones     "when I was younger; passed w/o treatment" (11/10/2013)  . Myocardial infarction ([place])   . Diabetic neuropathy ([place])   . H. pylori infection   . Wears glasses     Past Surgical History  Procedure Laterality Date  . Iliac artery stent  05/06/2011  . Tonsillectomy    . Aorta - bilateral femoral artery bypass graft  11/25/2011    Procedure: AORTA BIFEMORAL BYPASS GRAFT;  Surgeon: Rosetta Posner, MD;  Location: [place];  Service: Vascular;  Laterality: N/A;   . Pr vein bypass graft,aorto-fem-pop  11/24/2012  . Inguinal hernia repair Right   . Abdominal aortagram N/A 11/11/2011    Procedure: ABDOMINAL Maxcine Ham;  Surgeon: Angelia Mould, MD;  Location: [facility] CATH LAB;  Service: Cardiovascular;  Laterality: N/A;  . Lower extremity angiogram Bilateral 11/11/2011    Procedure: LOWER EXTREMITY ANGIOGRAM;  Surgeon: Angelia Mould, MD;  Location: [facility] CATH LAB;  Service: Cardiovascular;  Laterality: Bilateral;  . Left heart catheterization with coronary angiogram N/A 06/07/2013    Procedure: LEFT HEART CATHETERIZATION WITH CORONARY ANGIOGRAM;  Surgeon: Sherren Mocha, MD;  Location: [facility] CATH LAB;  Service: Cardiovascular;  Laterality: N/A;  . Percutaneous coronary stent intervention (pci-s) Right 06/07/2013    Procedure: PERCUTANEOUS CORONARY STENT INTERVENTION (PCI-S);  Surgeon: Sherren Mocha, MD;  Location: [facility] CATH LAB;  Service: Cardiovascular;  Laterality: Right;  . Left heart catheterization with coronary angiogram N/A 12/10/2013    Procedure: LEFT HEART CATHETERIZATION WITH CORONARY ANGIOGRAM;  Surgeon: Blane Ohara, MD;  Location: [facility] CATH LAB;  Service: Cardiovascular;  Laterality: N/A;  . Colonoscopy    . Peripheral vascular catheterization N/A 07/26/2015    Procedure: Abdominal Aortogram;  Surgeon: Serafina Mitchell, MD;  Location: [place] CV LAB;  Service: Cardiovascular;  Laterality: N/A;  . Peripheral vascular catheterization  Bilateral 07/26/2015    Procedure: Lower Extremity Angiography;  Surgeon: Serafina Mitchell, MD;  Location: [place] CV LAB;  Service: Cardiovascular;  Laterality: Bilateral;  . Femoral-popliteal bypass graft Left 07/28/2015    Procedure: LEFT FEMORAL-POPLITEAL ARTERY BYPASS GRAFT;  Surgeon: Rosetta Posner, MD;  Location[facility] OR;  Service: Vascular;  Laterality: Left;    Family History  Problem Relation Age of Onset  . Hypertension Mother   . Heart attack Mother   . Diabetes Mother   . Aneurysm Father   .  Heart attack Sister   . Heart disease Brother     heart transplant    Social History:  reports that he has been smoking Cigarettes.  He has a 23 pack-year smoking history. He has never used smokeless tobacco. He reports that he drinks alcohol. He reports that he uses illicit drugs (Marijuana and Cocaine) about once per week.  Allergies:  Allergies  Allergen Reactions  . Zetia [Ezetimibe] Anaphylaxis and Swelling    Tongue and throat   . Atorvastatin Other (See Comments)    Malaise & muscle weakness  . Penicillins Other (See Comments)    Unknown  . Pravastatin Sodium     Pravastatin 40 mg qday and 40 mg q M/W/F caused muscle aches    Medications: I have reviewed the patient's current medications.  Results for orders placed or performed during the hospital encounter of 11/21/15 (from the past 48 hour(s))  Urinalysis, Routine w reflex microscopic (not at [facility])     Status: Abnormal   Collection Time: 11/21/15  3:54 PM  Result Value Ref Range   Color, Urine AMBER (A) YELLOW    Comment: BIOCHEMICALS MAY BE AFFECTED BY COLOR   APPearance CLEAR CLEAR   Specific Gravity, Urine 1.030 1.005 - 1.030   pH 6.0 5.0 - 8.0   Glucose, UA 100 (A) NEGATIVE mg/dL   Hgb urine dipstick SMALL (A) NEGATIVE   Bilirubin Urine SMALL (A) NEGATIVE   Ketones, ur NEGATIVE NEGATIVE mg/dL   Protein, ur >300 (A) NEGATIVE mg/dL   Nitrite NEGATIVE NEGATIVE   Leukocytes, UA NEGATIVE NEGATIVE  Urine microscopic-add on     Status: Abnormal   Collection Time: 11/21/15  3:54 PM  Result Value Ref Range   Squamous Epithelial / LPF 0-5 (A) NONE SEEN   WBC, UA 0-5 0 - 5 WBC/hpf   RBC / HPF 6-30 0 - 5 RBC/hpf   Bacteria, UA FEW (A) NONE SEEN   Casts GRANULAR CAST (A) NEGATIVE   Urine-Other MUCOUS PRESENT   Lipase, blood     Status: None   Collection Time: 11/21/15  4:21 PM  Result Value Ref Range   Lipase 21 11 - 51 U/L  Comprehensive metabolic panel     Status: Abnormal   Collection Time: 11/21/15  4:21 PM   Result Value Ref Range   Sodium 139 135 - 145 mmol/L   Potassium 3.7 3.5 - 5.1 mmol/L   Chloride 97 (L) 101 - 111 mmol/L   CO2 30 22 - 32 mmol/L   Glucose, Bld 232 (H) 65 - 99 mg/dL   BUN 13 6 - 20 mg/dL   Creatinine, Ser 0.74 0.61 - 1.24 mg/dL   Calcium 10.3 8.9 - 10.3 mg/dL   Total Protein 7.7 6.5 - 8.1 g/dL   Albumin 4.7 3.5 - 5.0 g/dL   AST 36 15 - 41 U/L   ALT 32 17 - 63 U/L   Alkaline Phosphatase 66 38 - 126  U/L   Total Bilirubin 1.1 0.3 - 1.2 mg/dL   GFR calc non Af Amer >60 >60 mL/min   GFR calc Af Amer >60 >60 mL/min    Comment: (NOTE) The eGFR has been calculated using the CKD EPI equation. This calculation has not been validated in all clinical situations. eGFR's persistently <60 mL/min signify possible Chronic Kidney Disease.    Anion gap 12 5 - 15  CBC     Status: None   Collection Time: 11/21/15  4:21 PM  Result Value Ref Range   WBC 9.9 4.0 - 10.5 K/uL   RBC 5.31 4.22 - 5.81 MIL/uL   Hemoglobin 15.7 13.0 - 17.0 g/dL   HCT 47.0 39.0 - 52.0 %   MCV 88.5 78.0 - 100.0 fL   MCH 29.6 26.0 - 34.0 pg   MCHC 33.4 30.0 - 36.0 g/dL   RDW 14.7 11.5 - 15.5 %   Platelets  150 - 400 K/uL    PLATELET CLUMPS NOTED ON SMEAR, COUNT APPEARS ADEQUATE  I-stat troponin, ED     Status: None   Collection Time: 11/21/15  4:25 PM  Result Value Ref Range   Troponin i, poc 0.00 0.00 - 0.08 ng/mL   Comment 3            Comment: Due to the release kinetics of cTnI, a negative result within the first hours of the onset of symptoms does not rule out myocardial infarction with certainty. If myocardial infarction is still suspected, repeat the test at appropriate intervals.   I-Stat CG4 Lactic Acid, ED     Status: Abnormal   Collection Time: 11/21/15  5:55 PM  Result Value Ref Range   Lactic Acid, Venous 2.99 (HH) 0.5 - 2.0 mmol/L   Comment NOTIFIED PHYSICIAN     Ct Abdomen Pelvis W Contrast  11/21/2015  CLINICAL DATA:  Mid abdominal pain beginning this morning. Distension in  right lower quadrant. Withdrawing from oxycodone. Small bowel obstruction. EXAM: CT ABDOMEN AND PELVIS WITH CONTRAST TECHNIQUE: Multidetector CT imaging of the abdomen and pelvis was performed using the standard protocol following bolus administration of intravenous contrast. CONTRAST:  91m OMNIPAQUE IOHEXOL 300 MG/ML SOLN, 108mOMNIPAQUE IOHEXOL 300 MG/ML SOLN COMPARISON:  11/21/2015 plain films.  CT of 10/18/2014. FINDINGS: Lower chest: Bibasilar atelectasis. Normal heart size without pericardial or pleural effusion. Right coronary artery stent. Hepatobiliary: Probable perfusion anomaly in the high right hepatic lobe on image 14/series 2, similar. A too small to characterize lesion in the left hepatic lobe on image 16/series 2. Normal gallbladder, without biliary ductal dilatation. Pancreas: Mild pancreatic atrophy, without duct dilatation or dominant mass. Spleen: Normal in size, without focal abnormality. Adrenals/Urinary Tract: Normal adrenal glands. Bilateral fluid density renal lesions, most consistent with cysts. An inter/lower polar right renal lesion measures 5.3 x 5.8 cm including on image 42/series 2. This has a perceptible peripheral wall, suggesting complexity. Is however, decreased in size since the 2011 exam, favoring a benign etiology. No hydronephrosis. Normal urinary bladder. Stomach/Bowel: Mild to moderate gastric distention. Distal colon is diminutive, likely decompressed. The proximal colon is stool-filled. Appendix is not confidently identified. Terminal ileum is decompressed on image 56 of series 2. Proximal small bowel loops are mildly dilated and fluid-filled, including at 3.3 cm on image 60/series 2. Transition point is identified within the left hemipelvis, involving distal small bowel. Example image 58/series 2. No pneumatosis or free intraperitoneal air. No bowel wall thickening to suggest complicating ischemia. Vascular/Lymphatic: Status post aortic to  femoral bypass. Celiac and  superior mesenteric artery both patent. 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 No abdominopelvic adenopathy. Reproductive: Normal prostate.  Small bilateral hydroceles. Other: Small volume abdominal pelvic ascites. Musculoskeletal: Left iliac bone island. Advanced degenerative disc disease at L5-S1. IMPRESSION: 1. Partial small bowel obstruction. Transition point identified within the distal small bowel, likely secondary to adhesions. 2. Although no specific evidence of complicating ischemia is identified, there is new small volume abdominal pelvic ascites, nonspecific. 3. Bilateral hydroceles. Electronically Signed   By: Abigail Miyamoto M.D.   On: 11/21/2015 19:03   Dg Abd Acute W/chest  11/21/2015  CLINICAL DATA:  64 year old presenting with severe acute superimposed upon chronic intermittent generalized abdominal pain, associated with nausea and vomiting. EXAM: DG ABDOMEN ACUTE W/ 1V CHEST COMPARISON:  CT abdomen and pelvis 10/18/2014. Chest x-rays 01/17/2015 and earlier. FINDINGS: Multiple dilated loops of small bowel throughout the abdomen and pelvis demonstrating air-fluid levels on the erect image. Air-fluid level in the mildly distended and relatively featureless cecum and proximal ascending colon. Gas throughout otherwise normal caliber colon to the rectum. No evidence of free air on the lateral decubitus image. Native abdominal aortic and left common iliac and external iliac artery stents, though the patient has undergone a prior aortobifemoral bypass graft procedure as noted on the prior CT. Cardiac silhouette mildly enlarged, unchanged allowing for differences in technique. Lungs emphysematous though clear. No pleural effusions. IMPRESSION: 1. Small bowel obstruction.  No free intraperitoneal air. 2. Possible ascending colitis. 3. Mild cardiomegaly. COPD/emphysema. No acute cardiopulmonary disease. Electronically Signed   By: Evangeline Dakin M.D.   On: 11/21/2015  16:57    ROS Blood pressure 143/98, pulse 48, temperature 98.1 F (36.7 C), temperature source Oral, resp. rate 15, SpO2 98 %. Physical Exam  Assessment/Plan: 63 y.o. M with SBO, most likely due to adhesive disease.  Rec NG insertion to LWS overnight.  Cont IVF's.  NPO.  Will follow.  Oluwaseun Cremer C. 25/42/7062, 8:47 PM

## 2015-11-21 NOTE — ED Notes (Signed)
Patient denies nausea/vomiting.

## 2015-11-22 ENCOUNTER — Inpatient Hospital Stay (HOSPITAL_COMMUNITY): Payer: Medicaid Other

## 2015-11-22 LAB — CBC WITH DIFFERENTIAL/PLATELET
Basophils Absolute: 0 10*3/uL (ref 0.0–0.1)
Basophils Relative: 0 %
EOS ABS: 0 10*3/uL (ref 0.0–0.7)
Eosinophils Relative: 0 %
HEMATOCRIT: 45.4 % (ref 39.0–52.0)
HEMOGLOBIN: 15 g/dL (ref 13.0–17.0)
LYMPHS ABS: 1.2 10*3/uL (ref 0.7–4.0)
LYMPHS PCT: 23 %
MCH: 29.3 pg (ref 26.0–34.0)
MCHC: 33 g/dL (ref 30.0–36.0)
MCV: 88.7 fL (ref 78.0–100.0)
Monocytes Absolute: 0.5 10*3/uL (ref 0.1–1.0)
Monocytes Relative: 10 %
NEUTROS ABS: 3.7 10*3/uL (ref 1.7–7.7)
NEUTROS PCT: 67 %
Platelets: 121 10*3/uL — ABNORMAL LOW (ref 150–400)
RBC: 5.12 MIL/uL (ref 4.22–5.81)
RDW: 14.7 % (ref 11.5–15.5)
WBC: 5.4 10*3/uL (ref 4.0–10.5)

## 2015-11-22 LAB — COMPREHENSIVE METABOLIC PANEL
ALBUMIN: 3.7 g/dL (ref 3.5–5.0)
ALT: 24 U/L (ref 17–63)
AST: 22 U/L (ref 15–41)
Alkaline Phosphatase: 57 U/L (ref 38–126)
Anion gap: 10 (ref 5–15)
BILIRUBIN TOTAL: 0.6 mg/dL (ref 0.3–1.2)
BUN: 15 mg/dL (ref 6–20)
CHLORIDE: 99 mmol/L — AB (ref 101–111)
CO2: 29 mmol/L (ref 22–32)
Calcium: 9.3 mg/dL (ref 8.9–10.3)
Creatinine, Ser: 0.63 mg/dL (ref 0.61–1.24)
GFR calc Af Amer: >60 mL/min (ref >60)
GFR calc non Af Amer: >60 mL/min (ref >60)
GLUCOSE: 125 mg/dL — AB (ref 65–99)
POTASSIUM: 4.1 mmol/L (ref 3.5–5.1)
Sodium: 138 mmol/L (ref 135–145)
Total Protein: 6.2 g/dL — ABNORMAL LOW (ref 6.5–8.1)

## 2015-11-22 LAB — PHOSPHORUS: Phosphorus: 3.1 mg/dL (ref 2.5–4.6)

## 2015-11-22 LAB — GLUCOSE, CAPILLARY
Glucose-Capillary: 135 mg/dL — ABNORMAL HIGH (ref 65–99)
Glucose-Capillary: 108 mg/dL — ABNORMAL HIGH (ref 65–99)

## 2015-11-22 LAB — MAGNESIUM: MAGNESIUM: 1.7 mg/dL (ref 1.7–2.4)

## 2015-11-22 MED ORDER — BACITRACIN ZINC 500 UNIT/GM EX OINT
TOPICAL_OINTMENT | Freq: Every day | CUTANEOUS | Status: DC
  Administered 2015-11-22: 16:00:00 via TOPICAL
  Administered 2015-11-24 – 2015-11-26 (×3): 1 via TOPICAL
  Administered 2015-11-27: 12:00:00 via TOPICAL
  Administered 2015-11-28 – 2015-12-01 (×4): 1 via TOPICAL
  Administered 2015-12-02 – 2015-12-29 (×28): via TOPICAL
  Filled 2015-11-22 (×2): qty 28.35
  Filled 2015-11-22 (×2): qty 0.9
  Filled 2015-11-22 (×2): qty 28.35
  Filled 2015-11-22 (×3): qty 0.9
  Filled 2015-11-22 (×2): qty 28.35
  Filled 2015-11-22: qty 0.9
  Filled 2015-11-22 (×2): qty 28.35
  Filled 2015-11-22: qty 0.9
  Filled 2015-11-22 (×10): qty 28.35

## 2015-11-22 MED ORDER — KETOROLAC TROMETHAMINE 30 MG/ML IJ SOLN
30.0000 mg | Freq: Once | INTRAMUSCULAR | Status: AC
  Administered 2015-11-23: 30 mg via INTRAVENOUS
  Filled 2015-11-22: qty 1

## 2015-11-22 MED ORDER — PROMETHAZINE HCL 25 MG/ML IJ SOLN
12.5000 mg | Freq: Four times a day (QID) | INTRAMUSCULAR | Status: DC | PRN
  Administered 2015-11-22 – 2015-11-30 (×24): 12.5 mg via INTRAVENOUS
  Filled 2015-11-22 (×25): qty 1

## 2015-11-22 MED ORDER — PHENOL 1.4 % MT LIQD
1.0000 | OROMUCOSAL | Status: DC | PRN
  Administered 2015-11-22 – 2015-11-23 (×2): 1 via OROMUCOSAL
  Filled 2015-11-22: qty 177

## 2015-11-22 MED ORDER — DIATRIZOATE MEGLUMINE & SODIUM 66-10 % PO SOLN
90.0000 mL | Freq: Once | ORAL | Status: AC
  Administered 2015-11-22: 90 mL via NASOGASTRIC
  Filled 2015-11-22: qty 90

## 2015-11-22 MED ORDER — HYDROMORPHONE HCL 2 MG/ML IJ SOLN
2.0000 mg | INTRAMUSCULAR | Status: DC | PRN
  Administered 2015-11-22 – 2015-11-28 (×49): 2 mg via INTRAVENOUS
  Filled 2015-11-22 (×50): qty 1

## 2015-11-22 NOTE — Clinical Documentation Improvement (Signed)
Internal Medicine  Based on the clinical findings below, please document any associated diagnoses/conditions the patient has or may have.  Underweight  Other  Clinically Undetermined  Supporting Information:  Initial Nutrition Assessment by Rosezetta Schlatter, RD at 11/22/2015 12:09 PM  DOCUMENTATION CODES:    Underweight  INTERVENTION:   - RD will monitor for POC and diet advancement versus need for nutrition support  NUTRITION DIAGNOSIS:     Inadequate oral intake related to inability to eat as evidenced by NPO status.  REASON FOR ASSESSMENT:       Malnutrition Screening Tool  ASSESSMENT:      64 y.o. male with a past medical history of hyperlipidemia, hypertension, CAD, type 2 diabetes, diabetic peripheral neuropathy, seizure disorder who comes to the ER due to abdominal pain since this morning, shortly after he woke up, associated with abdominal distention, nausea and 2 episodes of emesis, one at home and one in the emergency department this afternoon per patient. He denies fever, chills, diarrhea, constipation, melena or hematochezia.   Height:   Ht Readings from Last 1 Encounters:   11/21/15   6\' 2"  (1.88 m)      Weight:   Wt Readings from Last 1 Encounters:   11/21/15   141 lb 8.6 oz (64.2 kg)      BMI:  Body mass index is 18.16 kg/(m^2).  Please update your documentation within the medical record to reflect your response to this query. Thank you. Please exercise your independent, professional judgment when responding. A specific answer is not anticipated or expected.  Thank You, Alessandra Grout, RN, BSN, CCDS,Clinical Documentation Specialist:  [phone]  [phone]=[facility] [place]- Health Information Management

## 2015-11-22 NOTE — Progress Notes (Signed)
Reported ab pain/nausea better, on prn phenergan , dilaudid to q2hrs prn,  On ng suction.  Ab soft, less tender, surgery following Reported chronic pain, not under control.

## 2015-11-22 NOTE — Consult Note (Signed)
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  Patient of [facility] Surgeon Dr. Sherren Mocha Early and he has undergone left femoral to below-knee popliteal bypass with saphenous vein on 07/28/15. Last seen by that service on 11/14/15 and is continuing to see them every 3 weeks for follow up.  I will continue their conservative orders. Patient is also followed by [facility] Wound type:arterial insufficiency Pressure Ulcer POA: No Measurement:Dorsal foot:  7.5cm x 5.5cm x -.4cm in the areas not obscured by thin yellow slough (25% of wound bed). Left great toe:  Debrided on 11/14/15 by Dr. Jule Ser.  2cm x 1.5cm x 0.2cm.  Pale dry wound bed with yellow slough obscuring 60% of wound bed. Wound bed:AS described above. Drainage (amount, consistency, odor) scant serous Periwound:Intact, dry Dressing procedure/placement/frequency: Xeroform gauze 4x4 to the dorsal foot daily, bacitracin ointment and a dry gauze to the left great toe daily.  Both are secured with a Kerliz roll gauze. Patient's foot is to be protected and the heel elevated while he is in bed by way of a Prevalon pressure redistribution boot.  It is noted that patient is being followed by [facility] for a possible SBO.  [place] nursing team will not follow, but will remain available to this patient, the nursing and medical teams.  Please re-consult if needed. Thanks, Maudie Flakes, MSN, RN, [place], Arther Abbott  Pager# [phone]

## 2015-11-22 NOTE — H&P (Signed)
[name] History and Physical  [name] [name] [id number] DOB: Oct 04, 1951 DOA: 11/21/2015  Referring physician: Anne Ng, PA-C PCP: Marijean Bravo, MD   Chief Complaint: Abdominal pain.  HPI: [name] [name] is a 64 y.o. male with a past medical history of hyperlipidemia, hypertension, CAD, type 2 diabetes, diabetic peripheral neuropathy, seizure disorder who comes to the ER due to abdominal pain since this morning, shortly after he woke up, associated with abdominal distention, nausea and 2 episodes of emesis, one at home and one in the emergency department this afternoon per patient. He denies fever, chills, diarrhea, constipation, melena or hematochezia.   Patient is being treated for chronic neuropathic pain with oxycodone. He stated that he ran out of oxycodone and had to come to the ER due to the intensity of the pain. Work up reveals a SBO on CT scan of abdomen.     Review of Systems:  Constitutional:  Positive fatigue.  No weight loss, night sweats, Fevers, chills, HEENT:  No headaches, Difficulty swallowing,Tooth/dental problems,Sore throat,  No sneezing, itching, ear ache, nasal congestion, post nasal drip,  Cardio-vascular:  No chest pain, Orthopnea, PND, swelling in lower extremities, anasarca, dizziness, palpitations  GI: Positive for abdominal pain, nausea, vomiting, loss of appetite. No heartburn, indigestion,  diarrhea, change in bowel habits  Resp:  No shortness of breath with exertion or at rest. No excess mucus, no productive cough, No non-productive cough, No coughing up of blood.No change in color of mucus.No wheezing.No chest wall deformity  Skin:  no rash or lesions.  GU:  no dysuria, change in color of urine, no urgency or frequency. No flank pain.  Musculoskeletal:  No joint pain or swelling. No decreased range of motion. No back pain.  Psych:  No change in mood or affect. No depression or anxiety. No memory loss.   Past Medical  History  Diagnosis Date  . Hyperlipidemia   . Hypertension   . Leg pain   . Peripheral vascular disease ([place])     s/p prior Ao-Bifem BPG  . 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 Coronary artery disease     a. NSTEMI 7/14=> LHC 06/07/13: Proximal LAD 30%, ostial D1 30%, AV circumflex 80%, then occluded, ostial OM2 occluded, left to left collaterals, mid RCA 95%, inferior HK, EF 60%. PCI: Promus DES to the mid RCA.  No intervention was recommended for the CTO circumflex.  . Ischemic cardiomyopathy     a. Echocardiogram 06/06/13: EF 35-40%, inferior AK, mild MR.  Marland Kitchen Shortness of breath     "can come on at anytime lately; before that it was just w/exertion" (11/10/2013)  . Type II diabetes mellitus ([place])   . [place] mal seizure [facility])     "last one was ~ 3 yr ago; controlled w/daily RX" (11/10/2013)  . Arthritis     "neck, back, legs" (11/10/2013)  . Kidney stones     "when I was younger; passed w/o treatment" (11/10/2013)  . Myocardial infarction ([place])   . Diabetic neuropathy ([place])   . H. pylori infection   . Wears glasses    Past Surgical History  Procedure Laterality Date  . Iliac artery stent  05/06/2011  . Tonsillectomy    . Aorta - bilateral femoral artery bypass graft  11/25/2011    Procedure: AORTA BIFEMORAL BYPASS GRAFT;  Surgeon: Rosetta Posner, MD;  Location: [place];  Service: Vascular;  Laterality: N/A;  . Pr vein bypass graft,aorto-fem-pop  11/24/2012  . Inguinal hernia repair Right   . Abdominal aortagram N/A 11/11/2011    Procedure: ABDOMINAL Maxcine Ham;  Surgeon: Angelia Mould, MD;  Location: [name] CATH LAB;  Service: Cardiovascular;  Laterality: N/A;  . Lower extremity angiogram Bilateral 11/11/2011    Procedure: LOWER EXTREMITY ANGIOGRAM;  Surgeon: Angelia Mould, MD;  Location: [facility] CATH LAB;  Service: Cardiovascular;  Laterality: Bilateral;  . Left heart catheterization with coronary angiogram N/A 06/07/2013     Procedure: LEFT HEART CATHETERIZATION WITH CORONARY ANGIOGRAM;  Surgeon: Sherren Mocha, MD;  Location: [facility] CATH LAB;  Service: Cardiovascular;  Laterality: N/A;  . Percutaneous coronary stent intervention (pci-s) Right 06/07/2013    Procedure: PERCUTANEOUS CORONARY STENT INTERVENTION (PCI-S);  Surgeon: Sherren Mocha, MD;  Location: [facility] CATH LAB;  Service: Cardiovascular;  Laterality: Right;  . Left heart catheterization with coronary angiogram N/A 12/10/2013    Procedure: LEFT HEART CATHETERIZATION WITH CORONARY ANGIOGRAM;  Surgeon: Blane Ohara, MD;  Location: [facility] CATH LAB;  Service: Cardiovascular;  Laterality: N/A;  . Colonoscopy    . Peripheral vascular catheterization N/A 07/26/2015    Procedure: Abdominal Aortogram;  Surgeon: Serafina Mitchell, MD;  Location: [place] CV LAB;  Service: Cardiovascular;  Laterality: N/A;  . Peripheral vascular catheterization Bilateral 07/26/2015    Procedure: Lower Extremity Angiography;  Surgeon: Serafina Mitchell, MD;  Location: [place] CV LAB;  Service: Cardiovascular;  Laterality: Bilateral;  . Femoral-popliteal bypass graft Left 07/28/2015    Procedure: LEFT FEMORAL-POPLITEAL ARTERY BYPASS GRAFT;  Surgeon: Rosetta Posner, MD;  Location: [place];  Service: Vascular;  Laterality: Left;   Social History:  reports that he has been smoking Cigarettes.  He has a 23 pack-year smoking history. He has never used smokeless tobacco. He reports that he drinks alcohol. He reports that he uses illicit drugs (Marijuana and Cocaine) about once per week.  Allergies  Allergen Reactions  . Zetia [Ezetimibe] Anaphylaxis and Swelling    Tongue and throat   . Atorvastatin Other (See Comments)    Malaise & muscle weakness  . Penicillins Other (See Comments)    Unknown  . Pravastatin Sodium     Pravastatin 40 mg qday and 40 mg q M/W/F caused muscle aches    Family History  Problem Relation Age of Onset  . Hypertension Mother   . Heart attack Mother   . Diabetes Mother    . Aneurysm Father   . Heart attack Sister   . Heart disease Brother     heart transplant    Prior to Admission medications   Medication Sig Start Date End Date Taking? Authorizing Provider  albuterol (PROVENTIL HFA;VENTOLIN HFA) 108 (90 BASE) MCG/ACT inhaler Inhale 2 puffs into the lungs every 6 (six) hours as needed for wheezing or shortness of breath. 11/11/13  Yes Ripudeep Krystal Eaton, MD  aspirin EC 81 MG tablet Take 1 tablet (81 mg total) by mouth daily. 11/06/15  Yes Sherren Mocha, MD  collagenase (SANTYL) ointment Apply thin layer to wound bed of left foot daily Patient taking differently: Apply 1 application topically daily. Apply thin layer to wound bed of left foot daily 10/26/15  Yes Rosetta Posner, MD  gabapentin (NEURONTIN) 300 MG capsule Take 300 mg by mouth 3 (three) times daily. 09/30/15  Yes Historical Provider, MD  glipiZIDE (GLUCOTROL) 10 MG tablet Take 10 mg by mouth 2 (two) times daily before a meal.   Yes Historical Provider,  MD  ketoconazole (NIZORAL) 2 % cream Apply 1 application topically daily as needed for irritation.   Yes Historical Provider, MD  Multiple Vitamins-Minerals (MULTIVITAMIN WITH MINERALS) tablet Take 4 tablets by mouth 3 (three) times daily.   Yes Historical Provider, MD  nitroGLYCERIN (NITROSTAT) 0.4 MG SL tablet Place 0.4 mg under the tongue every 5 (five) minutes as needed for chest pain.   Yes Historical Provider, MD  oxyCODONE (ROXICODONE) 15 MG immediate release tablet Take 3 tablets (45 mg total) by mouth every 6 (six) hours as needed (severe pain). 07/30/15  Yes Kimberly A Trinh, PA-C  PATADAY 0.2 % SOLN Place 1 drop into both eyes daily.  08/11/14  Yes Historical Provider, MD  polyethylene glycol (MIRALAX / GLYCOLAX) packet Take 17 g by mouth daily as needed for mild constipation or moderate constipation.    Yes Historical Provider, MD  triamcinolone cream (KENALOG) 0.1 % Apply 1 application topically 2 (two) times daily as needed (for eczema).   Yes  Historical Provider, MD  zolpidem (AMBIEN) 10 MG tablet Take 10 mg by mouth at bedtime as needed for sleep.   Yes Historical Provider, MD  silver sulfADIAZINE (SILVADENE) 1 % cream Apply 1 application topically daily. Patient not taking: Reported on 11/14/2015 07/25/15   Rosetta Posner, MD   Physical Exam: Filed Vitals:   11/21/15 1748 11/21/15 1810 11/21/15 1927 11/21/15 2101  BP: 157/85  143/98 158/81  Pulse: 51 49 48 85  Temp:   98.1 F (36.7 C) 97.8 F (36.6 C)  TempSrc:   Oral Oral  Resp: 25 15 15 18   Height:    6\' 2"  (1.88 m)  Weight:    64.2 kg (141 lb 8.6 oz)  SpO2: 99% 100% 98% 95%    Wt Readings from Last 3 Encounters:  11/21/15 64.2 kg (141 lb 8.6 oz)  11/14/15 62.143 kg (137 lb)  11/06/15 63.413 kg (139 lb 12.8 oz)    General:  Appears calm and comfortable Eyes: PERRL, normal lids, irises & conjunctiva ENT: grossly normal hearing, lips, oral mucosa are dry. Neck: no LAD, masses or thyromegaly Cardiovascular: RRR, no m/r/g. No LE edema. Telemetry: SR, no arrhythmias  Respiratory: CTA bilaterally, no w/r/r. Normal respiratory effort. Abdomen:   Midline surgical scar, bowel sounds decreased, soft, positive epigastric and right lower quadrant tenderness without guarding or rebound tenderness. Skin: no rash or induration seen on limited exam Musculoskeletal: grossly normal tone BUE/BLE Psychiatric: grossly normal mood and affect, speech fluent and appropriate Neurologic: grossly non-focal.          Labs on Admission:  Basic Metabolic Panel:  Recent Labs Lab 11/21/15 1621  NA 139  K 3.7  CL 97*  CO2 30  GLUCOSE 232*  BUN 13  CREATININE 0.74  CALCIUM 10.3   Liver Function Tests:  Recent Labs Lab 11/21/15 1621  AST 36  ALT 32  ALKPHOS 66  BILITOT 1.1  PROT 7.7  ALBUMIN 4.7    Recent Labs Lab 11/21/15 1621  LIPASE 21   CBC:  Recent Labs Lab 11/21/15 1621  WBC 9.9  HGB 15.7  HCT 47.0  MCV 88.5  PLT PLATELET CLUMPS NOTED ON SMEAR, COUNT  APPEARS ADEQUATE    Radiological Exams on Admission: Dg Abd 1 View  11/22/2015  CLINICAL DATA:  Status post nasogastric tube placement. Initial encounter. EXAM: ABDOMEN - 1 VIEW COMPARISON:  CT of the abdomen and pelvis performed earlier today at 6:45 p.m. FINDINGS: The patient's enteric tube is seen  ending overlying the body of the stomach, with the side port about the gastroesophageal junction. There is dilatation of small bowel loops up to 4.9 cm in maximal diameter, compatible with small bowel obstruction as previously noted. No definite free air is seen within the abdomen, though evaluation is limited on a single supine view. No acute osseous abnormalities are identified. A left common iliac stent is noted. The visualized lung apices are grossly clear. IMPRESSION: 1. Enteric tube seen ending overlying the body of the stomach, with the side port about the gastroesophageal junction. 2. Dilatation of small bowel loops up to 4.9 cm in maximal diameter, compatible with small bowel obstruction as previously noted. 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 There is approximately 360 degrees of twisting at the mesenteric axis. Would correlate clinically. These results were called by telephone at the time of interpretation on 11/22/2015 at 12:04 am to Dr. Marcello Moores, who verbally acknowledged these results. Electronically Signed   By: Garald Balding M.D.   On: 11/22/2015 00:08   Ct Abdomen Pelvis W Contrast  11/21/2015  CLINICAL DATA:  Mid abdominal pain beginning this morning. Distension in right lower quadrant. Withdrawing from oxycodone. Small bowel obstruction. EXAM: CT ABDOMEN AND PELVIS WITH CONTRAST TECHNIQUE: Multidetector CT imaging of the  abdomen and pelvis was performed using the standard protocol following bolus administration of intravenous contrast. CONTRAST:  38mL OMNIPAQUE IOHEXOL 300 MG/ML SOLN, 143mL OMNIPAQUE IOHEXOL 300 MG/ML SOLN COMPARISON:  11/21/2015 plain films.  CT of 10/18/2014. FINDINGS: Lower chest: Bibasilar atelectasis. Normal heart size without pericardial or pleural effusion. Right coronary artery stent. Hepatobiliary: Probable perfusion anomaly in the high right hepatic lobe on image 14/series 2, similar. A too small to characterize lesion in the left hepatic lobe on image 16/series 2. Normal gallbladder, without biliary ductal dilatation. Pancreas: Mild pancreatic atrophy, without duct dilatation or dominant mass. Spleen: Normal in size, without focal abnormality. Adrenals/Urinary Tract: Normal adrenal glands. Bilateral fluid density renal lesions, most consistent with cysts. An inter/lower polar right renal lesion measures 5.3 x 5.8 cm including on image 42/series 2. This has a perceptible peripheral wall, suggesting complexity. Is however, decreased in size since the 2011 exam, favoring a benign etiology. No hydronephrosis. Normal urinary bladder. Stomach/Bowel: Mild to moderate gastric distention. Distal colon is diminutive, likely decompressed. The proximal colon is stool-filled. Appendix is not confidently identified. Terminal ileum is decompressed on image 56 of series 2. Proximal small bowel loops are mildly dilated and fluid-filled, including at 3.3 cm on image 60/series 2. Transition point is identified within the left hemipelvis, involving distal small bowel. Example image 58/series 2. No pneumatosis or free intraperitoneal air. No bowel wall thickening to suggest complicating ischemia. Vascular/Lymphatic: Status post aortic to femoral bypass. Celiac and superior mesenteric artery both patent. 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 No abdominopelvic adenopathy. Reproductive:  Normal prostate.  Small bilateral hydroceles. Other: Small volume abdominal pelvic ascites. Musculoskeletal: Left iliac bone island. Advanced degenerative disc disease at L5-S1. IMPRESSION: 1. Partial small bowel obstruction. Transition point identified within the distal small bowel, likely secondary to adhesions. 2. Although no specific evidence of complicating ischemia is identified, there is new small volume abdominal pelvic ascites, nonspecific. 3. Bilateral hydroceles. Electronically Signed   By: Marylyn Ishihara  Jobe Igo M.D.   On: 11/21/2015 19:03   Dg Abd Acute W/chest  11/21/2015  CLINICAL DATA:  64 year old presenting with severe acute superimposed upon chronic intermittent generalized abdominal pain, associated with nausea and vomiting. EXAM: DG ABDOMEN ACUTE W/ 1V CHEST COMPARISON:  CT abdomen and pelvis 10/18/2014. Chest x-rays 01/17/2015 and earlier. FINDINGS: Multiple dilated loops of small bowel throughout the abdomen and pelvis demonstrating air-fluid levels on the erect image. Air-fluid level in the mildly distended and relatively featureless cecum and proximal ascending colon. Gas throughout otherwise normal caliber colon to the rectum. No evidence of free air on the lateral decubitus image. Native abdominal aortic and left common iliac and external iliac artery stents, though the patient has undergone a prior aortobifemoral bypass graft procedure as noted on the prior CT. Cardiac silhouette mildly enlarged, unchanged allowing for differences in technique. Lungs emphysematous though clear. No pleural effusions. IMPRESSION: 1. Small bowel obstruction.  No free intraperitoneal air. 2. Possible ascending colitis. 3. Mild cardiomegaly. COPD/emphysema. No acute cardiopulmonary disease. Electronically Signed   By: Evangeline Dakin M.D.   On: 11/21/2015 16:57    EKG: Independently reviewed. Vent. rate 40 BPM PR interval 166 ms QRS duration 118 ms QT/QTc 530/432 ms P-R-T axes 93 95 74 Sinus  bradycardia Nonspecific intraventricular conduction delay Anteroseptal infarct, old   Assessment/Plan Principal Problem:   SBO (small bowel obstruction) ([place]) Admit to MedSurg. Nothing by mouth. NGT suctioning. Continue IV fluids with potassium supplementation. I would like to thank Dr. Leighton Ruff for her input.  Active Problems:   Tobacco abuse Nicotine replacement therapy ordered.    Type 2 diabetes mellitus (H[facility]) CBG monitoring. Resume oral hypoglycemic agents once the patient is tolerating by mouth.    CAD (coronary artery disease) As symptomatic at this time. Resume low-dose aspirin once tolerating by mouth.    Proteinuria Consider starting low-dose ACE inhibitor. Patient to follow up with nephrology as an outpatient.    Code Status: Full code. DVT Prophylaxis: SCDs. Family Communication:  Disposition Plan: Admit to MedSurg for NGT suctioning and pain management.  Time spent: Over 70 minutes were spent in the process of his admission.  Reubin Milan [name] Pager [phone].

## 2015-11-22 NOTE — Progress Notes (Addendum)
Patient ID: [name] [name], male   DOB: 31-Mar-1951, 64 y.o.   MRN: [id number]    Subjective: Pt crying.  Mad at the hospital and everyone taking care of him.  He's mad that he has had chronic pain for the last 3 years and feels like no one is addressing his pain here, despite the nurse giving him pain meds as we were talking. (2mg  of dilaudid q 2hrs!)  Denies flatus or BM Objective: Vital signs in last 24 hours: Temp:  [97.7 F (36.5 C)-98.3 F (36.8 C)] 98.3 F (36.8 C) (12/28 0434) Pulse Rate:  [43-85] 47 (12/28 0434) Resp:  [15-25] 19 (12/28 0434) BP: (143-170)/(81-98) 163/81 mmHg (12/28 0434) SpO2:  [95 %-100 %] 96 % (12/28 0434) Weight:  [64.2 kg (141 lb 8.6 oz)] 64.2 kg (141 lb 8.6 oz) (12/27 2101) Last BM Date: 11/17/15  Intake/Output from previous day: 12/27 0701 - 12/28 0700 In: 1240 [P.O.:240; I.V.:1000] Out: 300 [Urine:300] Intake/Output this shift:    PE: Heart: regular Lungs: CTAB Abd: soft, quite tender in the lower abdomen, he is poochy, but not tight distended, hypoactive BS Extremities:  Scars from fem-pop on left with 5 cm ulcer on top of foot  Lab Results:   Recent Labs  11/21/15 1621 11/22/15 0452  WBC 9.9 5.4  HGB 15.7 15.0  HCT 47.0 45.4  PLT PLATELET CLUMPS NOTED ON SMEAR, COUNT APPEARS ADEQUATE 121*   BMET  Recent Labs  11/21/15 1621 11/22/15 0452  NA 139 138  K 3.7 4.1  CL 97* 99*  CO2 30 29  GLUCOSE 232* 125*  BUN 13 15  CREATININE 0.74 0.63  CALCIUM 10.3 9.3   PT/INR No results for input(s): LABPROT, INR in the last 72 hours. CMP     Component Value Date/Time   NA 138 11/22/2015 0452   K 4.1 11/22/2015 0452   CL 99* 11/22/2015 0452   CO2 29 11/22/2015 0452   GLUCOSE 125* 11/22/2015 0452   BUN 15 11/22/2015 0452   CREATININE 0.63 11/22/2015 0452   CALCIUM 9.3 11/22/2015 0452   PROT 6.2* 11/22/2015 0452   ALBUMIN 3.7 11/22/2015 0452   AST 22 11/22/2015 0452   ALT 24 11/22/2015 0452   ALKPHOS 57 11/22/2015 0452    BILITOT 0.6 11/22/2015 0452   GFRNONAA >60 11/22/2015 0452   GFRAA >60 11/22/2015 0452   Lipase     Component Value Date/Time   LIPASE 21 11/21/2015 1621       Studies/Results: Dg Abd 1 View  11/22/2015  CLINICAL DATA:  Status post nasogastric tube placement. Initial encounter. EXAM: ABDOMEN - 1 VIEW COMPARISON:  CT of the abdomen and pelvis performed earlier today at 6:45 p.m. FINDINGS: The patient's enteric tube is seen ending overlying the body of the stomach, with the side port about the gastroesophageal junction. There is dilatation of small bowel loops up to 4.9 cm in maximal diameter, compatible with small bowel obstruction as previously noted. No definite free air is seen within the abdomen, though evaluation is limited on a single supine view. No acute osseous abnormalities are identified. A left common iliac stent is noted. The visualized lung apices are grossly clear. IMPRESSION: 1. Enteric tube seen ending overlying the body of the stomach, with the side port about the gastroesophageal junction. 2. Dilatation of small bowel loops up to 4.9 cm in maximal diameter, compatible with small bowel obstruction as previously noted. On further evaluation of recent CT, the appearance is suspicious for a somewhat  complex mesenteric volvulus at the lower abdomen with concern for closed loop obstruction, and decreased enhancement of a long segment of the distal ileum best noted on coronal images, concerning for impending bowel ischemia. There is approximately 360 degrees of twisting at the mesenteric axis. Would correlate clinically. These results were called by telephone at the time of interpretation on 11/22/2015 at 12:04 am to Dr. Marcello Moores, who verbally acknowledged these results. Electronically Signed   By: Garald Balding M.D.   On: 11/22/2015 00:08   Ct Abdomen Pelvis W Contrast  11/21/2015  CLINICAL DATA:  Mid abdominal pain beginning this morning. Distension in right lower quadrant.  Withdrawing from oxycodone. Small bowel obstruction. EXAM: CT ABDOMEN AND PELVIS WITH CONTRAST TECHNIQUE: Multidetector CT imaging of the abdomen and pelvis was performed using the standard protocol following bolus administration of intravenous contrast. CONTRAST:  20mL OMNIPAQUE IOHEXOL 300 MG/ML SOLN, 128mL OMNIPAQUE IOHEXOL 300 MG/ML SOLN COMPARISON:  11/21/2015 plain films.  CT of 10/18/2014. FINDINGS: Lower chest: Bibasilar atelectasis. Normal heart size without pericardial or pleural effusion. Right coronary artery stent. Hepatobiliary: Probable perfusion anomaly in the high right hepatic lobe on image 14/series 2, similar. A too small to characterize lesion in the left hepatic lobe on image 16/series 2. Normal gallbladder, without biliary ductal dilatation. Pancreas: Mild pancreatic atrophy, without duct dilatation or dominant mass. Spleen: Normal in size, without focal abnormality. Adrenals/Urinary Tract: Normal adrenal glands. Bilateral fluid density renal lesions, most consistent with cysts. An inter/lower polar right renal lesion measures 5.3 x 5.8 cm including on image 42/series 2. This has a perceptible peripheral wall, suggesting complexity. Is however, decreased in size since the 2011 exam, favoring a benign etiology. No hydronephrosis. Normal urinary bladder. Stomach/Bowel: Mild to moderate gastric distention. Distal colon is diminutive, likely decompressed. The proximal colon is stool-filled. Appendix is not confidently identified. Terminal ileum is decompressed on image 56 of series 2. Proximal small bowel loops are mildly dilated and fluid-filled, including at 3.3 cm on image 60/series 2. Transition point is identified within the left hemipelvis, involving distal small bowel. Example image 58/series 2. No pneumatosis or free intraperitoneal air. No bowel wall thickening to suggest complicating ischemia. Vascular/Lymphatic: Status post aortic to femoral bypass. Celiac and superior mesenteric artery  both patent. 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 No abdominopelvic adenopathy. Reproductive: Normal prostate.  Small bilateral hydroceles. Other: Small volume abdominal pelvic ascites. Musculoskeletal: Left iliac bone island. Advanced degenerative disc disease at L5-S1. IMPRESSION: 1. Partial small bowel obstruction. Transition point identified within the distal small bowel, likely secondary to adhesions. 2. Although no specific evidence of complicating ischemia is identified, there is new small volume abdominal pelvic ascites, nonspecific. 3. Bilateral hydroceles. Electronically Signed   By: Abigail Miyamoto M.D.   On: 11/21/2015 19:03   Dg Abd Acute W/chest  11/21/2015  CLINICAL DATA:  64 year old presenting with severe acute superimposed upon chronic intermittent generalized abdominal pain, associated with nausea and vomiting. EXAM: DG ABDOMEN ACUTE W/ 1V CHEST COMPARISON:  CT abdomen and pelvis 10/18/2014. Chest x-rays 01/17/2015 and earlier. FINDINGS: Multiple dilated loops of small bowel throughout the abdomen and pelvis demonstrating air-fluid levels on the erect image. Air-fluid level in the mildly distended and relatively featureless cecum and proximal ascending colon. Gas throughout otherwise normal caliber colon to the rectum. No evidence of free air on the lateral decubitus image. Native abdominal aortic and left common iliac and external iliac artery stents, though the patient has undergone a prior aortobifemoral bypass graft  procedure as noted on the prior CT. Cardiac silhouette mildly enlarged, unchanged allowing for differences in technique. Lungs emphysematous though clear. No pleural effusions. IMPRESSION: 1. Small bowel obstruction.  No free intraperitoneal air. 2. Possible ascending colitis. 3. Mild cardiomegaly. COPD/emphysema. No acute cardiopulmonary disease. Electronically Signed   By: Evangeline Dakin M.D.   On: 11/21/2015 16:57     Anti-infectives: Anti-infectives    None     Assessment/Plan  1. SBO  -clinically SBO persists.  Will see what x-rays show this morning.  May need SBO protocol to start.  There was some question of close loop SBO, but not definite.  He is not tachy, BP normal, and WBC normal.  He does have a lot of pain, but his complaints are all over from his wound on his heel, to his belly, to just everywhere.  It is very difficult to tell right now how much of this pain is acute vs chronic.  His abdomen is very soft right now.  Will follow x-ray and d/w Dr. Lucia Gaskins 2. Chronic pain  -per medicine 3. MMP  -per medicine  4. h/o Polysubstance abuse  -admits to h/o cocaine and marijuana in the past  -admits to drinking in the past  -tobacco abuse ongoing 5.  5cm ulcer on the dorsum of the left foot   LOS: 1 day    OSBORNE,KELLY E 11/22/2015, 8:45 AM Pager: [phone]  Agree with above. His chronic pain will make management difficult - no matter what we do.  He identifies Dr. Joylene Igo as his PCP.  He was on 30 mg oxycodone 8 times per day prior to admission.  Though he says he was on 60 mg eight times a day as recently as a month ago. At this moment, his abdomen is completely benign.  Alphonsa Overall, MD, [facility] Pager: [phone] Office phone:  [phone]

## 2015-11-22 NOTE — Progress Notes (Signed)
Initial Nutrition Assessment  DOCUMENTATION CODES:   Underweight  INTERVENTION:  - RD will monitor for POC and diet advancement versus need for nutrition support  NUTRITION DIAGNOSIS:   Inadequate oral intake related to inability to eat as evidenced by NPO status.  GOAL:   Patient will meet greater than or equal to 90% of their needs  MONITOR:   Diet advancement, Weight trends, Labs, Skin, I & O's  REASON FOR ASSESSMENT:   Malnutrition Screening Tool  ASSESSMENT:   64 y.o. male with a past medical history of hyperlipidemia, hypertension, CAD, type 2 diabetes, diabetic peripheral neuropathy, seizure disorder who comes to the ER due to abdominal pain since this morning, shortly after he woke up, associated with abdominal distention, nausea and 2 episodes of emesis, one at home and one in the emergency department this afternoon per patient. He denies fever, chills, diarrhea, constipation, melena or hematochezia.   Pt seen for MST. BMI indicates underweight status. Pt has been NPO since admission and unable to meet needs. Pt sleeping at the time of RD visit and no family/visitors present in the room.   Per rounds and other chart notes, pt seems to be agitated/upset. Spoke with RN outside of pt's room and informed her that RD would not awake pt at this time. Will attempt to collect information on follow-up. NGT to suction with ~500cc drainage at time of visit.   Unable to complete physical assessment at this time. Per chart review, pt's weight has been stable (137-140 lbs) since 08/15/15. Will monitor for possible weight fluctuations as weight had fluctuated from the end of August through the end of September of this year. Pt may meet criteria for malnutrition to some degree but unable to confirm at this time.   Medications reviewed. Labs reviewed.  Diet Order:  Diet NPO time specified  Skin:  Wound (see comment) (L foot and toe DM ulcers)  Last BM:  PTA  Height:   Ht Readings  from Last 1 Encounters:  11/21/15 6\' 2"  (1.88 m)    Weight:   Wt Readings from Last 1 Encounters:  11/21/15 141 lb 8.6 oz (64.2 kg)    Ideal Body Weight:  86.36 kg (kg)  BMI:  Body mass index is 18.16 kg/(m^2).  Estimated Nutritional Needs:   Kcal:  1600-1800  Protein:  65-80 grams  Fluid:  >/= 2 L/day  EDUCATION NEEDS:   No education needs identified at this time     Jarome Matin, RD, LDN Inpatient Clinical Dietitian Pager # [phone] After hours/weekend pager # [phone]

## 2015-11-23 DIAGNOSIS — R809 Proteinuria, unspecified: Secondary | ICD-10-CM

## 2015-11-23 DIAGNOSIS — Z720 Tobacco use: Secondary | ICD-10-CM

## 2015-11-23 DIAGNOSIS — E119 Type 2 diabetes mellitus without complications: Secondary | ICD-10-CM

## 2015-11-23 LAB — GLUCOSE, CAPILLARY
GLUCOSE-CAPILLARY: 71 mg/dL (ref 65–99)
GLUCOSE-CAPILLARY: 112 mg/dL — AB (ref 65–99)
Glucose-Capillary: 76 mg/dL (ref 65–99)
Glucose-Capillary: 79 mg/dL (ref 65–99)
Glucose-Capillary: 88 mg/dL (ref 65–99)
Glucose-Capillary: 90 mg/dL (ref 65–99)

## 2015-11-23 LAB — CBC WITH DIFFERENTIAL/PLATELET
Basophils Absolute: 0 10*3/uL (ref 0.0–0.1)
Basophils Relative: 1 %
EOS ABS: 0.1 10*3/uL (ref 0.0–0.7)
Eosinophils Relative: 1 %
HEMATOCRIT: 42.6 % (ref 39.0–52.0)
HEMOGLOBIN: 13.7 g/dL (ref 13.0–17.0)
LYMPHS ABS: 1.2 10*3/uL (ref 0.7–4.0)
LYMPHS PCT: 32 %
MCH: 29.1 pg (ref 26.0–34.0)
MCHC: 32.2 g/dL (ref 30.0–36.0)
MCV: 90.4 fL (ref 78.0–100.0)
MONOS PCT: 16 %
Monocytes Absolute: 0.6 10*3/uL (ref 0.1–1.0)
NEUTROS PCT: 50 %
Neutro Abs: 1.8 10*3/uL (ref 1.7–7.7)
PLATELETS: 124 10*3/uL — AB (ref 150–400)
RBC: 4.71 MIL/uL (ref 4.22–5.81)
RDW: 14.9 % (ref 11.5–15.5)
WBC: 3.7 10*3/uL — ABNORMAL LOW (ref 4.0–10.5)

## 2015-11-23 LAB — BASIC METABOLIC PANEL
ANION GAP: 10 (ref 5–15)
BUN: 19 mg/dL (ref 6–20)
CALCIUM: 9.2 mg/dL (ref 8.9–10.3)
CO2: 28 mmol/L (ref 22–32)
CREATININE: 0.69 mg/dL (ref 0.61–1.24)
Chloride: 104 mmol/L (ref 101–111)
Glucose, Bld: 87 mg/dL (ref 65–99)
Potassium: 4.4 mmol/L (ref 3.5–5.1)
SODIUM: 142 mmol/L (ref 135–145)

## 2015-11-23 LAB — MAGNESIUM: Magnesium: 2 mg/dL (ref 1.7–2.4)

## 2015-11-23 LAB — TSH: TSH: 0.747 u[IU]/mL (ref 0.350–4.500)

## 2015-11-23 LAB — LACTIC ACID, PLASMA: Lactic Acid, Venous: 0.9 mmol/L (ref 0.5–2.0)

## 2015-11-23 MED ORDER — BISACODYL 10 MG RE SUPP
10.0000 mg | Freq: Once | RECTAL | Status: AC
  Administered 2015-11-23: 10 mg via RECTAL
  Filled 2015-11-23: qty 1

## 2015-11-23 MED ORDER — SORBITOL 70 % SOLN
960.0000 mL | TOPICAL_OIL | Freq: Once | ORAL | Status: AC
  Administered 2015-11-23: 960 mL via RECTAL
  Filled 2015-11-23: qty 240

## 2015-11-23 MED ORDER — KCL IN DEXTROSE-NACL 20-5-0.9 MEQ/L-%-% IV SOLN
INTRAVENOUS | Status: DC
  Administered 2015-11-23 – 2015-11-24 (×2): via INTRAVENOUS
  Filled 2015-11-23 (×3): qty 1000

## 2015-11-23 MED ORDER — GLUCOSE 40 % PO GEL
1.0000 | Freq: Once | ORAL | Status: AC
  Administered 2015-11-23: 37.5 g via ORAL

## 2015-11-23 MED ORDER — GLUCOSE 40 % PO GEL
ORAL | Status: AC
  Administered 2015-11-23: 37.5 g
  Filled 2015-11-23: qty 1

## 2015-11-23 MED ORDER — PANTOPRAZOLE SODIUM 40 MG IV SOLR
40.0000 mg | Freq: Two times a day (BID) | INTRAVENOUS | Status: DC
  Administered 2015-11-23 – 2015-12-10 (×35): 40 mg via INTRAVENOUS
  Filled 2015-11-23 (×39): qty 40

## 2015-11-23 NOTE — Progress Notes (Addendum)
PROGRESS NOTE  [name] [name] [id number] DOB: 06/06/1951 DOA: 11/21/2015 PCP: Marijean Bravo, MD  HPI/Recap of past 24 hours:  Ab pain much better, no n/v, abdominal film with persistent sbo, keep npo, ng removed Hypoglycemia, start d5/ns/k  Assessment/Plan: Principal Problem:   SBO (small bowel obstruction) (HCC) Active Problems:   Tobacco abuse   Type 2 diabetes mellitus (HCC)   CAD (coronary artery disease)   Proteinuria  SBO (small bowel obstruction) (HCC) NGT suctioning, IV fluids with d5/nd/20k Repeat abdominal imaging, persistent sbo, no bm, no flatus, continue npo, ng suction. Ab pain has much improved. Patient agreed to enema. Encourage ambulation. Appreciate general surgery input, will f/u on general surgery rec's.  Type 2 diabetes mellitus ([place]), previous diet controlled, not on meds a1c pending, last a1c in 06/2015 was 7.4, report not able to tolerate metformin.  CAD (coronary artery disease) with h/o NSTEMI om 2014, s/p DES to RCA Denies chest pain, has been refusing to take asa or plavix in the past. Closed followed by cardiology.  HLD: not interested in taking any meds.  PVD/ Nonhealing left dorsal foot ulcer and left great toe ulcer, s/p left femoral to below knee popliteal bypass on 07/28/2015, closed followed by vascular surgery Dr. Donnetta Hutching , general surgery Dr Mariea Clonts (left great toe debrided on 11/14/15 by dr Mariea Clonts) ,wound care.   H/o sinus bradycardia, asymptomatic, avoid nodal blocking agent.  H/o seizure, reported dilantin was topped a month ago, and he was started on neurontin, last seizure two years ago.  Proteinuria Consider starting low-dose ACE inhibitor. Patient to follow up with nephrology as an outpatient.  Chronic pain, need adjust pain meds  Tobacco abuse Smoking cessation education provided Nicotine replacement therapy ordered.  Code Status: Full code. DVT Prophylaxis: SCDs. Family Communication: patient Disposition Plan: home  with home health RN when medically stable    Consultants:  General surgery  Procedures:  Ng suction from admission, removed on 12/29  Antibiotics:  none   Objective: BP 144/69 mmHg  Pulse 47  Temp(Src) 97.9 F (36.6 C) (Oral)  Resp 18  Ht 6\' 2"  (1.88 m)  Wt 141 lb 8.6 oz (64.2 kg)  BMI 18.16 kg/m2  SpO2 98%  Intake/Output Summary (Last 24 hours) at 11/23/15 1545 Last data filed at 11/23/15 1300  Gross per 24 hour  Intake   1610 ml  Output   1601 ml  Net      9 ml   Filed Weights   11/21/15 2101  Weight: 141 lb 8.6 oz (64.2 kg)    Exam:   General:  NAD  Cardiovascular: RRR  Respiratory: CTABL  Abdomen: Soft/ND/NT, positive BS  Musculoskeletal: left dorsal foot ulcer and left great toe ulcer (chronic, followed by vascular surgery Dr .Donnetta Hutching)  Neuro: aaox3  Data Reviewed: Basic Metabolic Panel:  Recent Labs Lab 11/21/15 1621 11/22/15 0452 11/23/15 0534  NA 139 138 142  K 3.7 4.1 4.4  CL 97* 99* 104  CO2 30 29 28   GLUCOSE 232* 125* 87  BUN 13 15 19   CREATININE 0.74 0.63 0.69  CALCIUM 10.3 9.3 9.2  MG  --  1.7 2.0  PHOS  --  3.1  --    Liver Function Tests:  Recent Labs Lab 11/21/15 1621 11/22/15 0452  AST 36 22  ALT 32 24  ALKPHOS 66 57  BILITOT 1.1 0.6  PROT 7.7 6.2*  ALBUMIN 4.7 3.7    Recent Labs Lab 11/21/15 1621  LIPASE 21  No results for input(s): AMMONIA in the last 168 hours. CBC:  Recent Labs Lab 11/21/15 1621 11/22/15 0452 11/23/15 0534  WBC 9.9 5.4 3.7*  NEUTROABS  --  3.7 1.8  HGB 15.7 15.0 13.7  HCT 47.0 45.4 42.6  MCV 88.5 88.7 90.4  PLT PLATELET CLUMPS NOTED ON SMEAR, COUNT APPEARS ADEQUATE 121* 124*   Cardiac Enzymes:   No results for input(s): CKTOTAL, CKMB, CKMBINDEX, TROPONINI in the last 168 hours. BNP (last 3 results) No results for input(s): BNP in the last 8760 hours.  ProBNP (last 3 results) No results for input(s): PROBNP in the last 8760 hours.  CBG:  Recent Labs Lab  11/22/15 1749 11/23/15 0009 11/23/15 0530 11/23/15 1249 11/23/15 1512  GLUCAP 108* 90 79 76 88    No results found for this or any previous visit (from the past 240 hour(s)).   Studies: Dg Abd Portable 1v-small Bowel Obstruction Protocol-initial, 8 Hr Delay  11/23/2015  CLINICAL DATA:  8 hours following administration of contrast. Assess small-bowel obstruction. Initial encounter. EXAM: PORTABLE ABDOMEN - 1 VIEW COMPARISON:  Abdominal radiograph performed earlier today at 8:49 a.m., and CT of the abdomen and pelvis performed 11/21/2015 FINDINGS: Per clinical notes, contrast was administered 8 hours ago. It is not seen at this time. This may reflect diffuse dilution of contrast within the small bowel. There is persistent dilatation of small-bowel loops to 5.2 cm in maximal diameter, compatible with small bowel obstruction. No definite free intra-abdominal air is seen. Residual stool is seen within the colon. Contrast is seen within the bladder. Mild degenerative change is noted along the lumbar spine. The patient's enteric tube is seen ending at the body of the stomach, with the side port at the distal esophagus. A vascular stent is noted along the left common iliac and external iliac arteries. IMPRESSION: Contrast is not seen at this time. This may reflect diffuse dilution of contrast within the small bowel. Persistent dilatation of small-bowel loops to 5.2 cm in maximal diameter, compatible with small bowel obstruction. No definite free intra-abdominal air seen. 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 Would follow the patient's clinical condition carefully. Electronically Signed   By: Garald Balding M.D.   On: 11/23/2015 01:09    Scheduled Meds: . bacitracin   Topical Daily  . pantoprazole (PROTONIX) IV  40 mg Intravenous Q12H  . sodium chloride  3 mL Intravenous Q12H  . sorbitol, milk of mag, mineral oil, glycerin (SMOG) enema  960 mL Rectal Once     Continuous Infusions: . dextrose 5 % and 0.9 % NaCl with KCl 20 mEq/L 75 mL/hr at 11/23/15 1505     Time spent: 80mins  Vernecia Umble MD, PhD  [name] Pager [phone]. If 7PM-7AM, please contact night-coverage at [email or web address], password [facility] 11/23/2015, 3:45 PM  LOS: 2 days

## 2015-11-23 NOTE — Progress Notes (Signed)
Portable abdominal study completed. There was communication with radiology as to the time the gastrografin was administered and that the study showed no contrast seen at this time.

## 2015-11-23 NOTE — Progress Notes (Addendum)
Patient ID: [name] [name], male   DOB: [birth year], 64 y.o.   MRN: [id number]    Subjective: Pt feeling better today.  Much happier today and not having any pain.  No flatus or BM yet.  Did admit to eating chia and flax seeds prior this occurrence.  He also states he has been so constipated that he has had to manually disimpact himself multiple times prior to admission with rock hard stool.  Objective: Vital signs in last 24 hours: Temp:  [98.1 F (36.7 C)-98.9 F (37.2 C)] 98.3 F (36.8 C) (12/29 0501) Pulse Rate:  [47-49] 47 (12/29 0501) Resp:  [18] 18 (12/29 0501) BP: (139-146)/(73-80) 139/76 mmHg (12/29 0501) SpO2:  [97 %-100 %] 97 % (12/29 0501) Last BM Date: 11/17/15  Intake/Output from previous day: 12/28 0701 - 12/29 0700 In: 2410 [I.V.:2410] Out: 1400 [Urine:350; Emesis/NG output:1050] Intake/Output this shift:    PE: Abd: soft, NT, Nd, +BS, NGT with some dark bilious blood tinged output  Lab Results:   Recent Labs  11/22/15 0452 11/23/15 0534  WBC 5.4 3.7*  HGB 15.0 13.7  HCT 45.4 42.6  PLT 121* 124*   BMET  Recent Labs  11/22/15 0452 11/23/15 0534  NA 138 142  K 4.1 4.4  CL 99* 104  CO2 29 28  GLUCOSE 125* 87  BUN 15 19  CREATININE 0.63 0.69  CALCIUM 9.3 9.2   PT/INR No results for input(s): LABPROT, INR in the last 72 hours. CMP     Component Value Date/Time   NA 142 11/23/2015 0534   K 4.4 11/23/2015 0534   CL 104 11/23/2015 0534   CO2 28 11/23/2015 0534   GLUCOSE 87 11/23/2015 0534   BUN 19 11/23/2015 0534   CREATININE 0.69 11/23/2015 0534   CALCIUM 9.2 11/23/2015 0534   PROT 6.2* 11/22/2015 0452   ALBUMIN 3.7 11/22/2015 0452   AST 22 11/22/2015 0452   ALT 24 11/22/2015 0452   ALKPHOS 57 11/22/2015 0452   BILITOT 0.6 11/22/2015 0452   GFRNONAA >60 11/23/2015 0534   GFRAA >60 11/23/2015 0534   Lipase     Component Value Date/Time   LIPASE 21 11/21/2015 1621       Studies/Results: Dg Abd 1 View  11/22/2015  CLINICAL  DATA:  Follow-up SBO EXAM: ABDOMEN - 1 VIEW COMPARISON:  11/21/2015 FINDINGS: Enteric tube in the proximal stomach. Moderate dilatation of multiple small bowel loops, compatible with persistent small bowel obstruction. Vascular stents. Residual extra contrast in the bladder. IMPRESSION: Enteric tube in the proximal stomach. Stable small bowel dilatation, consistent with persistent small bowel obstruction. Electronically Signed   By: Julian Hy M.D.   On: 11/22/2015 09:13   Dg Abd 1 View  11/22/2015  CLINICAL DATA:  Status post nasogastric tube placement. Initial encounter. EXAM: ABDOMEN - 1 VIEW COMPARISON:  CT of the abdomen and pelvis performed earlier today at 6:45 p.m. FINDINGS: The patient's enteric tube is seen ending overlying the body of the stomach, with the side port about the gastroesophageal junction. There is dilatation of small bowel loops up to 4.9 cm in maximal diameter, compatible with small bowel obstruction as previously noted. No definite free air is seen within the abdomen, though evaluation is limited on a single supine view. No acute osseous abnormalities are identified. A left common iliac stent is noted. The visualized lung apices are grossly clear. IMPRESSION: 1. Enteric tube seen ending overlying the body of the stomach, with the side port about the  gastroesophageal junction. 2. Dilatation of small bowel loops up to 4.9 cm in maximal diameter, compatible with small bowel obstruction as previously noted. 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 There is approximately 360 degrees of twisting at the mesenteric axis. Would correlate clinically. These results were called by telephone at the time of interpretation on  11/22/2015 at 12:04 am to Dr. Marcello Moores, who verbally acknowledged these results. Electronically Signed   By: Garald Balding M.D.   On: 11/22/2015 00:08   Ct Abdomen Pelvis W Contrast  11/21/2015  CLINICAL DATA:  Mid abdominal pain beginning this morning. Distension in right lower quadrant. Withdrawing from oxycodone. Small bowel obstruction. EXAM: CT ABDOMEN AND PELVIS WITH CONTRAST TECHNIQUE: Multidetector CT imaging of the abdomen and pelvis was performed using the standard protocol following bolus administration of intravenous contrast. CONTRAST:  40mL OMNIPAQUE IOHEXOL 300 MG/ML SOLN, 169mL OMNIPAQUE IOHEXOL 300 MG/ML SOLN COMPARISON:  11/21/2015 plain films.  CT of 10/18/2014. FINDINGS: Lower chest: Bibasilar atelectasis. Normal heart size without pericardial or pleural effusion. Right coronary artery stent. Hepatobiliary: Probable perfusion anomaly in the high right hepatic lobe on image 14/series 2, similar. A too small to characterize lesion in the left hepatic lobe on image 16/series 2. Normal gallbladder, without biliary ductal dilatation. Pancreas: Mild pancreatic atrophy, without duct dilatation or dominant mass. Spleen: Normal in size, without focal abnormality. Adrenals/Urinary Tract: Normal adrenal glands. Bilateral fluid density renal lesions, most consistent with cysts. An inter/lower polar right renal lesion measures 5.3 x 5.8 cm including on image 42/series 2. This has a perceptible peripheral wall, suggesting complexity. Is however, decreased in size since the 2011 exam, favoring a benign etiology. No hydronephrosis. Normal urinary bladder. Stomach/Bowel: Mild to moderate gastric distention. Distal colon is diminutive, likely decompressed. The proximal colon is stool-filled. Appendix is not confidently identified. Terminal ileum is decompressed on image 56 of series 2. Proximal small bowel loops are mildly dilated and fluid-filled, including at 3.3 cm on image 60/series 2. Transition point is  identified within the left hemipelvis, involving distal small bowel. Example image 58/series 2. No pneumatosis or free intraperitoneal air. No bowel wall thickening to suggest complicating ischemia. Vascular/Lymphatic: Status post aortic to femoral bypass. Celiac and superior mesenteric artery both patent. 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 No abdominopelvic adenopathy. Reproductive: Normal prostate.  Small bilateral hydroceles. Other: Small volume abdominal pelvic ascites. Musculoskeletal: Left iliac bone island. Advanced degenerative disc disease at L5-S1. IMPRESSION: 1. Partial small bowel obstruction. Transition point identified within the distal small bowel, likely secondary to adhesions. 2. Although no specific evidence of complicating ischemia is identified, there is new small volume abdominal pelvic ascites, nonspecific. 3. Bilateral hydroceles. Electronically Signed   By: Abigail Miyamoto M.D.   On: 11/21/2015 19:03   Dg Abd Acute W/chest  11/21/2015  CLINICAL DATA:  64 year old presenting with severe acute superimposed upon chronic intermittent generalized abdominal pain, associated with nausea and vomiting. EXAM: DG ABDOMEN ACUTE W/ 1V CHEST COMPARISON:  CT abdomen and pelvis 10/18/2014. Chest x-rays 01/17/2015 and earlier. FINDINGS: Multiple dilated loops of small bowel throughout the abdomen and pelvis demonstrating air-fluid levels on the erect image. Air-fluid level in the mildly distended and relatively featureless cecum and proximal ascending colon. Gas throughout otherwise normal caliber  colon to the rectum. No evidence of free air on the lateral decubitus image. Native abdominal aortic and left common iliac and external iliac artery stents, though the patient has undergone a prior aortobifemoral bypass graft procedure as noted on the prior CT. Cardiac silhouette mildly enlarged, unchanged allowing for differences in technique. Lungs emphysematous though  clear. No pleural effusions. IMPRESSION: 1. Small bowel obstruction.  No free intraperitoneal air. 2. Possible ascending colitis. 3. Mild cardiomegaly. COPD/emphysema. No acute cardiopulmonary disease. Electronically Signed   By: Evangeline Dakin M.D.   On: 11/21/2015 16:57   Dg Abd Portable 1v-small Bowel Obstruction Protocol-initial, 8 Hr Delay  11/23/2015  CLINICAL DATA:  8 hours following administration of contrast. Assess small-bowel obstruction. Initial encounter. EXAM: PORTABLE ABDOMEN - 1 VIEW COMPARISON:  Abdominal radiograph performed earlier today at 8:49 a.m., and CT of the abdomen and pelvis performed 11/21/2015 FINDINGS: Per clinical notes, contrast was administered 8 hours ago. It is not seen at this time. This may reflect diffuse dilution of contrast within the small bowel. There is persistent dilatation of small-bowel loops to 5.2 cm in maximal diameter, compatible with small bowel obstruction. No definite free intra-abdominal air is seen. Residual stool is seen within the colon. Contrast is seen within the bladder. Mild degenerative change is noted along the lumbar spine. The patient's enteric tube is seen ending at the body of the stomach, with the side port at the distal esophagus. A vascular stent is noted along the left common iliac and external iliac arteries. IMPRESSION: Contrast is not seen at this time. This may reflect diffuse dilution of contrast within the small bowel. Persistent dilatation of small-bowel loops to 5.2 cm in maximal diameter, compatible with small bowel obstruction. No definite free intra-abdominal air seen. 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 Would follow the patient's clinical condition carefully. Electronically Signed   By: Garald Balding M.D.   On: 11/23/2015 01:09    Anti-infectives: Anti-infectives    None      Assessment/Plan  1. SBO  -secondary to adhesions vs bowel dysmotility/constipation from  chronic narcotic use  -cont NGT today, increase protonix to BID to help his stomach, but blood in NG likely from trauma  -needs to get OOB and mobilize -suppository today and if no results, will give a SMOG enema -SBO protocol films does not show much change.  Contrast not seen, but dilatation persists.  Repeat films in am -patient's abdomen is completely benign.  No concerns for bowel ischemia.    LOS: 2 days    OSBORNE,KELLY E 11/23/2015, 10:12 AM Pager: [phone]  Agree above. 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 His abdomen remains completely benign.  We can probably get the NGT out and see how he does.  This whole event is appearing more like an ileus - possibly related to his narcotic use.  Alphonsa Overall, MD, [facility] Surgery Pager: [phone] Office phone:  [phone]

## 2015-11-24 ENCOUNTER — Inpatient Hospital Stay (HOSPITAL_COMMUNITY): Payer: Medicaid Other

## 2015-11-24 LAB — CBC
HEMATOCRIT: 36.3 % — AB (ref 39.0–52.0)
HEMATOCRIT: 39.1 % (ref 39.0–52.0)
HEMOGLOBIN: 12 g/dL — AB (ref 13.0–17.0)
Hemoglobin: 12.8 g/dL — ABNORMAL LOW (ref 13.0–17.0)
MCH: 30.4 pg (ref 26.0–34.0)
MCH: 29.4 pg (ref 26.0–34.0)
MCHC: 33.1 g/dL (ref 30.0–36.0)
MCHC: 32.7 g/dL (ref 30.0–36.0)
MCV: 91.9 fL (ref 78.0–100.0)
MCV: 89.9 fL (ref 78.0–100.0)
Platelets: UNDETERMINED 10*3/uL (ref 150–400)
Platelets: 71 10*3/uL — ABNORMAL LOW (ref 150–400)
RBC: 3.95 MIL/uL — ABNORMAL LOW (ref 4.22–5.81)
RBC: 4.35 MIL/uL (ref 4.22–5.81)
RDW: 15.1 % (ref 11.5–15.5)
RDW: 14.6 % (ref 11.5–15.5)
WBC: 2.7 10*3/uL — ABNORMAL LOW (ref 4.0–10.5)
WBC: 3 10*3/uL — ABNORMAL LOW (ref 4.0–10.5)

## 2015-11-24 LAB — GLUCOSE, CAPILLARY
GLUCOSE-CAPILLARY: 105 mg/dL — AB (ref 65–99)
Glucose-Capillary: 109 mg/dL — ABNORMAL HIGH (ref 65–99)
Glucose-Capillary: 425 mg/dL — ABNORMAL HIGH (ref 65–99)
Glucose-Capillary: 116 mg/dL — ABNORMAL HIGH (ref 65–99)
Glucose-Capillary: 134 mg/dL — ABNORMAL HIGH (ref 65–99)

## 2015-11-24 LAB — BASIC METABOLIC PANEL
ANION GAP: 6 (ref 5–15)
BUN: 15 mg/dL (ref 6–20)
CHLORIDE: 109 mmol/L (ref 101–111)
CO2: 28 mmol/L (ref 22–32)
Calcium: 9.2 mg/dL (ref 8.9–10.3)
Creatinine, Ser: 0.57 mg/dL — ABNORMAL LOW (ref 0.61–1.24)
GFR calc non Af Amer: >60 mL/min (ref >60)
GLUCOSE: 121 mg/dL — AB (ref 65–99)
Potassium: 4.1 mmol/L (ref 3.5–5.1)
Sodium: 143 mmol/L (ref 135–145)

## 2015-11-24 LAB — LIPID PANEL
CHOL/HDL RATIO: 4.3 ratio
CHOLESTEROL: 154 mg/dL (ref 0–200)
HDL: 36 mg/dL — AB (ref >40)
LDL Cholesterol: 96 mg/dL (ref 0–99)
Triglycerides: 110 mg/dL (ref <150)
VLDL: 22 mg/dL (ref 0–40)

## 2015-11-24 LAB — HEMOGLOBIN A1C
HEMOGLOBIN A1C: 6.9 % — AB (ref 4.8–5.6)
Mean Plasma Glucose: 151 mg/dL

## 2015-11-24 LAB — LACTIC ACID, PLASMA: LACTIC ACID, VENOUS: 0.8 mmol/L (ref 0.5–2.0)

## 2015-11-24 LAB — MAGNESIUM: Magnesium: 1.8 mg/dL (ref 1.7–2.4)

## 2015-11-24 MED ORDER — MAGNESIUM SULFATE IN D5W 10-5 MG/ML-% IV SOLN
1.0000 g | Freq: Once | INTRAVENOUS | Status: AC
  Administered 2015-11-24: 1 g via INTRAVENOUS
  Filled 2015-11-24: qty 100

## 2015-11-24 MED ORDER — POLYETHYLENE GLYCOL 3350 17 G PO PACK
17.0000 g | PACK | Freq: Every day | ORAL | Status: DC
  Administered 2015-11-25: 17 g via ORAL
  Filled 2015-11-24 (×2): qty 1

## 2015-11-24 MED ORDER — LORAZEPAM 2 MG/ML IJ SOLN
0.5000 mg | Freq: Once | INTRAMUSCULAR | Status: AC
  Administered 2015-11-24: 0.5 mg via INTRAVENOUS
  Filled 2015-11-24: qty 1

## 2015-11-24 MED ORDER — POTASSIUM CHLORIDE IN NACL 20-0.9 MEQ/L-% IV SOLN
INTRAVENOUS | Status: DC
  Administered 2015-11-24 (×2): via INTRAVENOUS
  Filled 2015-11-24 (×3): qty 1000

## 2015-11-24 MED ORDER — POLYETHYLENE GLYCOL 3350 17 G PO PACK: 17.0000 g | PACK | Freq: Every day | ORAL | Status: DC | PRN

## 2015-11-24 MED ORDER — COLLAGENASE 250 UNIT/GM EX OINT
1.0000 "application " | TOPICAL_OINTMENT | Freq: Every day | CUTANEOUS | Status: DC
  Administered 2015-11-25 – 2015-12-29 (×31): 1 via TOPICAL
  Filled 2015-11-24 (×2): qty 30

## 2015-11-24 MED ORDER — KETOROLAC TROMETHAMINE 30 MG/ML IJ SOLN
30.0000 mg | Freq: Four times a day (QID) | INTRAMUSCULAR | Status: AC | PRN
  Administered 2015-11-24 – 2015-11-29 (×16): 30 mg via INTRAVENOUS
  Filled 2015-11-24 (×17): qty 1

## 2015-11-24 MED ORDER — GABAPENTIN 300 MG PO CAPS
300.0000 mg | ORAL_CAPSULE | Freq: Three times a day (TID) | ORAL | Status: DC
  Administered 2015-11-24 – 2015-11-25 (×2): 300 mg via ORAL
  Filled 2015-11-24 (×2): qty 1

## 2015-11-24 MED ORDER — TRIAMCINOLONE ACETONIDE 0.1 % EX CREA
1.0000 "application " | TOPICAL_CREAM | Freq: Two times a day (BID) | CUTANEOUS | Status: DC | PRN
  Filled 2015-11-24: qty 15

## 2015-11-24 MED ORDER — ALBUTEROL SULFATE (2.5 MG/3ML) 0.083% IN NEBU
2.5000 mg | INHALATION_SOLUTION | Freq: Four times a day (QID) | RESPIRATORY_TRACT | Status: DC | PRN
  Administered 2015-12-18 – 2015-12-22 (×4): 2.5 mg via RESPIRATORY_TRACT
  Filled 2015-11-24 (×3): qty 3

## 2015-11-24 MED ORDER — ASPIRIN EC 81 MG PO TBEC
81.0000 mg | DELAYED_RELEASE_TABLET | Freq: Every day | ORAL | Status: DC
  Administered 2015-11-25: 81 mg via ORAL
  Filled 2015-11-24: qty 1

## 2015-11-24 NOTE — Progress Notes (Signed)
NG tube inserted, pt tolerated well, connected to LWS and pt with bright red output.  Pt stated that he had a red popsicle and cranberry juice earlier.  MD informed of output color and patient intake, orders received for continued monitoring. [name] [name] A

## 2015-11-24 NOTE — Progress Notes (Signed)
Patient ID: [name] [name], male   DOB: [birth year], 64 y.o.   MRN: [id number]    Subjective: Pt feels well this morning.  Denies nausea.  Had a good BM yesterday with suppository and enema.  Some flatus since then.  Objective: Vital signs in last 24 hours: Temp:  [97.9 F (36.6 C)] 97.9 F (36.6 C) (12/29 2144) Pulse Rate:  [47-50] 50 (12/29 2144) Resp:  [16-18] 16 (12/29 2144) BP: (136-144)/(69-71) 136/71 mmHg (12/29 2144) SpO2:  [97 %-98 %] 97 % (12/29 2144) Last BM Date: 11/17/15  Intake/Output from previous day: 12/29 0701 - 12/30 0700 In: 1393.8 [I.V.:1393.8] Out: 201 [Urine:200; Stool:1] Intake/Output this shift:    PE: Abd: soft, NT, ND, +BS  Lab Results:   Recent Labs  11/23/15 0534 11/24/15 0445  WBC 3.7* 2.7*  HGB 13.7 12.0*  HCT 42.6 36.3*  PLT 124* PLATELET CLUMPS NOTED ON SMEAR, UNABLE TO ESTIMATE   BMET  Recent Labs  11/23/15 0534 11/24/15 0445  NA 142 143  K 4.4 4.1  CL 104 109  CO2 28 28  GLUCOSE 87 121*  BUN 19 15  CREATININE 0.69 0.57*  CALCIUM 9.2 9.2   PT/INR No results for input(s): LABPROT, INR in the last 72 hours. CMP     Component Value Date/Time   NA 143 11/24/2015 0445   K 4.1 11/24/2015 0445   CL 109 11/24/2015 0445   CO2 28 11/24/2015 0445   GLUCOSE 121* 11/24/2015 0445   BUN 15 11/24/2015 0445   CREATININE 0.57* 11/24/2015 0445   CALCIUM 9.2 11/24/2015 0445   PROT 6.2* 11/22/2015 0452   ALBUMIN 3.7 11/22/2015 0452   AST 22 11/22/2015 0452   ALT 24 11/22/2015 0452   ALKPHOS 57 11/22/2015 0452   BILITOT 0.6 11/22/2015 0452   GFRNONAA >60 11/24/2015 0445   GFRAA >60 11/24/2015 0445   Lipase     Component Value Date/Time   LIPASE 21 11/21/2015 1621       Studies/Results: Dg Abd 1 View  11/22/2015  CLINICAL DATA:  Follow-up SBO EXAM: ABDOMEN - 1 VIEW COMPARISON:  11/21/2015 FINDINGS: Enteric tube in the proximal stomach. Moderate dilatation of multiple small bowel loops, compatible with persistent small  bowel obstruction. Vascular stents. Residual extra contrast in the bladder. IMPRESSION: Enteric tube in the proximal stomach. Stable small bowel dilatation, consistent with persistent small bowel obstruction. Electronically Signed   By: Julian Hy M.D.   On: 11/22/2015 09:13   Dg Abd 2 Views  11/24/2015  CLINICAL DATA:  Follow-up small bowel obstruction. Initial encounter. EXAM: ABDOMEN - 2 VIEW COMPARISON:  Abdominal radiograph performed 11/22/2015 FINDINGS: The small bowel is dilated to 5.2 cm in maximal diameter, again reflecting small bowel obstruction. There is a decreased amount of stool in the colon. No definite free intra-abdominal air is seen. A vascular stent is noted at the left hemipelvis. No acute osseous abnormalities are identified. The visualized lung bases are grossly clear. IMPRESSION: Small bowel dilatation to 5.2 cm in maximal diameter, reflecting persistent small-bowel obstruction. No definite free intra-abdominal air seen. Electronically Signed   By: Garald Balding M.D.   On: 11/24/2015 00:38   Dg Abd Portable 1v-small Bowel Obstruction Protocol-initial, 8 Hr Delay  11/23/2015  CLINICAL DATA:  8 hours following administration of contrast. Assess small-bowel obstruction. Initial encounter. EXAM: PORTABLE ABDOMEN - 1 VIEW COMPARISON:  Abdominal radiograph performed earlier today at 8:49 a.m., and CT of the abdomen and pelvis performed 11/21/2015 FINDINGS: Per clinical notes,  contrast was administered 8 hours ago. It is not seen at this time. This may reflect diffuse dilution of contrast within the small bowel. There is persistent dilatation of small-bowel loops to 5.2 cm in maximal diameter, compatible with small bowel obstruction. No definite free intra-abdominal air is seen. Residual stool is seen within the colon. Contrast is seen within the bladder. Mild degenerative change is noted along the lumbar spine. The patient's enteric tube is seen ending at the body of the stomach,  with the side port at the distal esophagus. A vascular stent is noted along the left common iliac and external iliac arteries. IMPRESSION: Contrast is not seen at this time. This may reflect diffuse dilution of contrast within the small bowel. Persistent dilatation of small-bowel loops to 5.2 cm in maximal diameter, compatible with small bowel obstruction. No definite free intra-abdominal air seen. 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 Would follow the patient's clinical condition carefully. Electronically Signed   By: Garald Balding M.D.   On: 11/23/2015 01:09    Anti-infectives: Anti-infectives    None       Assessment/Plan   1. SBO -secondary to adhesions vs bowel dysmotility/constipation from chronic narcotic use -tolerated NG out well.  Will give clear liquids today.  Will also add miralax to help with constipation as well -needs to get OOB and mobilize             -will stop checking routine films for now and treat clinically.  If he worsens with clear liquids though with more distention, pain, nausea/vomiting, then we may need to recheck some films.   LOS: 3 days    OSBORNE,KELLY E 11/24/2015, 8:01 AM Pager: [phone]  Agree with above. He seems to be doing much better.  The nurses have struggled with him because of his personality.  Alphonsa Overall, MD, [facility] Surgery Pager: [phone] Office phone:  [phone]

## 2015-11-24 NOTE — Progress Notes (Signed)
PROGRESS NOTE  [name] [name] [id number] DOB: 08-14-1951 DOA: 11/21/2015 PCP: Marijean Bravo, MD  HPI/Recap of past 24 hours:  Increase pain after started on clear, blood in gastric content?  Assessment/Plan: Principal Problem:   SBO (small bowel obstruction) (HCC) Active Problems:   Tobacco abuse   Type 2 diabetes mellitus (HCC)   CAD (coronary artery disease)   Proteinuria  SBO (small bowel obstruction) ([place]) NGT removed on 12/29, surgery started clears on 12/30, but not able to tolerate with increase ab pain, restarted ng suction,  keep IV fluids for another 24hrs, keep k>4, mag>2. Appreciate general surgery input, will f/u on general surgery rec's. May need to have surgery  Type 2 diabetes mellitus ([place]), previous diet controlled, not on meds a1c pending, last a1c in 06/2015 was 7.4, report not able to tolerate metformin.  CAD (coronary artery disease) with h/o NSTEMI om 2014, s/p DES to RCA Denies chest pain, has been refusing to take asa or plavix in the past. Closed followed by cardiology.  HLD: not interested in taking any meds.  PVD/ Nonhealing left dorsal foot ulcer and left great toe ulcer, s/p left femoral to below knee popliteal bypass on 07/28/2015, closed followed by vascular surgery Dr. Donnetta Hutching , general surgery Dr Mariea Clonts (left great toe debrided on 11/14/15 by dr Mariea Clonts) ,wound care.   H/o sinus bradycardia, asymptomatic, avoid nodal blocking agent.  H/o seizure, reported dilantin was topped a month ago, and he was started on neurontin, last seizure two years ago.  Proteinuria Consider starting low-dose ACE inhibitor. Patient to follow up with nephrology as an outpatient.  Chronic pain, need adjust pain meds  Tobacco abuse Smoking cessation education provided Nicotine replacement therapy ordered.  Code Status: Full code. DVT Prophylaxis: SCDs. Family Communication: patient Disposition Plan: home with home health RN when medically  stable    Consultants:  General surgery  Procedures:  Ng suction from admission, removed on 12/29, reinserted on 12/30  Antibiotics:  none   Objective: BP 136/71 mmHg  Pulse 50  Temp(Src) 97.9 F (36.6 C) (Oral)  Resp 16  Ht 6\' 2"  (1.88 m)  Wt 141 lb 8.6 oz (64.2 kg)  BMI 18.16 kg/m2  SpO2 97%  Intake/Output Summary (Last 24 hours) at 11/24/15 0801 Last data filed at 11/23/15 2300  Gross per 24 hour  Intake 1393.75 ml  Output    201 ml  Net 1192.75 ml   Filed Weights   11/21/15 2101  Weight: 141 lb 8.6 oz (64.2 kg)    Exam:   General:  NAD  Cardiovascular: RRR  Respiratory: CTABL  Abdomen: tender, mild distension, decreased BS  Musculoskeletal: left dorsal foot ulcer and left great toe ulcer (chronic, followed by vascular surgery Dr .Donnetta Hutching)  Neuro: aaox3  Data Reviewed: Basic Metabolic Panel:  Recent Labs Lab 11/21/15 1621 11/22/15 0452 11/23/15 0534 11/24/15 0445  NA 139 138 142 143  K 3.7 4.1 4.4 4.1  CL 97* 99* 104 109  CO2 30 29 28 28   GLUCOSE 232* 125* 87 121*  BUN 13 15 19 15   CREATININE 0.74 0.63 0.69 0.57*  CALCIUM 10.3 9.3 9.2 9.2  MG  --  1.7 2.0 1.8  PHOS  --  3.1  --   --    Liver Function Tests:  Recent Labs Lab 11/21/15 1621 11/22/15 0452  AST 36 22  ALT 32 24  ALKPHOS 66 57  BILITOT 1.1 0.6  PROT 7.7 6.2*  ALBUMIN 4.7 3.7  Recent Labs Lab 11/21/15 1621  LIPASE 21   No results for input(s): AMMONIA in the last 168 hours. CBC:  Recent Labs Lab 11/21/15 1621 11/22/15 0452 11/23/15 0534 11/24/15 0445  WBC 9.9 5.4 3.7* 2.7*  NEUTROABS  --  3.7 1.8  --   HGB 15.7 15.0 13.7 12.0*  HCT 47.0 45.4 42.6 36.3*  MCV 88.5 88.7 90.4 91.9  PLT PLATELET CLUMPS NOTED ON SMEAR, COUNT APPEARS ADEQUATE 121* 124* PLATELET CLUMPS NOTED ON SMEAR, UNABLE TO ESTIMATE   Cardiac Enzymes:   No results for input(s): CKTOTAL, CKMB, CKMBINDEX, TROPONINI in the last 168 hours. BNP (last 3 results) No results for  input(s): BNP in the last 8760 hours.  ProBNP (last 3 results) No results for input(s): PROBNP in the last 8760 hours.  CBG:  Recent Labs Lab 11/23/15 1512 11/23/15 1657 11/24/15 11/24/15 0548 11/24/15 0728  GLUCAP 88 112* 105* 109* 425*    No results found for this or any previous visit (from the past 240 hour(s)).   Studies: Dg Abd 2 Views  11/24/2015  CLINICAL DATA:  Follow-up small bowel obstruction. Initial encounter. EXAM: ABDOMEN - 2 VIEW COMPARISON:  Abdominal radiograph performed 11/22/2015 FINDINGS: The small bowel is dilated to 5.2 cm in maximal diameter, again reflecting small bowel obstruction. There is a decreased amount of stool in the colon. No definite free intra-abdominal air is seen. A vascular stent is noted at the left hemipelvis. No acute osseous abnormalities are identified. The visualized lung bases are grossly clear. IMPRESSION: Small bowel dilatation to 5.2 cm in maximal diameter, reflecting persistent small-bowel obstruction. No definite free intra-abdominal air seen. Electronically Signed   By: Garald Balding M.D.   On: 11/24/2015 00:38    Scheduled Meds: . bacitracin   Topical Daily  . magnesium sulfate 1 - 4 g bolus IVPB  1 g Intravenous Once  . pantoprazole (PROTONIX) IV  40 mg Intravenous Q12H  . sodium chloride  3 mL Intravenous Q12H    Continuous Infusions: . 0.9 % NaCl with KCl 20 mEq / L       Time spent: 17mins  Jadden Yim MD, PhD  [name] Pager [phone]. If 7PM-7AM, please contact night-coverage at [email or web address], password [facility] 11/24/2015, 8:01 AM  LOS: 3 days

## 2015-11-25 ENCOUNTER — Inpatient Hospital Stay (HOSPITAL_COMMUNITY): Payer: Medicaid Other

## 2015-11-25 DIAGNOSIS — I739 Peripheral vascular disease, unspecified: Secondary | ICD-10-CM

## 2015-11-25 LAB — BASIC METABOLIC PANEL
ANION GAP: 8 (ref 5–15)
BUN: 9 mg/dL (ref 6–20)
CALCIUM: 8.7 mg/dL — AB (ref 8.9–10.3)
CHLORIDE: 105 mmol/L (ref 101–111)
CO2: 26 mmol/L (ref 22–32)
Creatinine, Ser: 0.49 mg/dL — ABNORMAL LOW (ref 0.61–1.24)
GFR calc non Af Amer: >60 mL/min (ref >60)
Glucose, Bld: 77 mg/dL (ref 65–99)
POTASSIUM: 4 mmol/L (ref 3.5–5.1)
Sodium: 139 mmol/L (ref 135–145)

## 2015-11-25 LAB — GLUCOSE, CAPILLARY
GLUCOSE-CAPILLARY: 99 mg/dL (ref 65–99)
Glucose-Capillary: 67 mg/dL (ref 65–99)
Glucose-Capillary: 73 mg/dL (ref 65–99)
Glucose-Capillary: 69 mg/dL (ref 65–99)
Glucose-Capillary: 93 mg/dL (ref 65–99)
Glucose-Capillary: 90 mg/dL (ref 65–99)

## 2015-11-25 LAB — MAGNESIUM: Magnesium: 1.6 mg/dL — ABNORMAL LOW (ref 1.7–2.4)

## 2015-11-25 LAB — CBC WITH DIFFERENTIAL/PLATELET
BASOS ABS: 0 10*3/uL (ref 0.0–0.1)
Basophils Relative: 0 %
EOS PCT: 3 %
Eosinophils Absolute: 0.1 10*3/uL (ref 0.0–0.7)
HCT: 37.3 % — ABNORMAL LOW (ref 39.0–52.0)
Hemoglobin: 12 g/dL — ABNORMAL LOW (ref 13.0–17.0)
LYMPHS PCT: 44 %
Lymphs Abs: 1.3 10*3/uL (ref 0.7–4.0)
MCH: 28.8 pg (ref 26.0–34.0)
MCHC: 32.2 g/dL (ref 30.0–36.0)
MCV: 89.4 fL (ref 78.0–100.0)
Monocytes Absolute: 0.6 10*3/uL (ref 0.1–1.0)
Monocytes Relative: 20 %
NEUTROS ABS: 0.9 10*3/uL — AB (ref 1.7–7.7)
Neutrophils Relative %: 33 %
PLATELETS: 100 10*3/uL — AB (ref 150–400)
RBC: 4.17 MIL/uL — AB (ref 4.22–5.81)
RDW: 14.7 % (ref 11.5–15.5)
WBC: 2.9 10*3/uL — AB (ref 4.0–10.5)

## 2015-11-25 LAB — LACTIC ACID, PLASMA: LACTIC ACID, VENOUS: 0.7 mmol/L (ref 0.5–2.0)

## 2015-11-25 MED ORDER — HYDRALAZINE HCL 20 MG/ML IJ SOLN
10.0000 mg | Freq: Four times a day (QID) | INTRAMUSCULAR | Status: DC | PRN
  Administered 2015-12-04: 10 mg via INTRAVENOUS
  Filled 2015-11-25: qty 1

## 2015-11-25 MED ORDER — KCL IN DEXTROSE-NACL 20-5-0.9 MEQ/L-%-% IV SOLN
INTRAVENOUS | Status: AC
  Administered 2015-11-25 – 2015-11-28 (×5): via INTRAVENOUS
  Filled 2015-11-25 (×8): qty 1000

## 2015-11-25 MED ORDER — MAGNESIUM SULFATE 2 GM/50ML IV SOLN
2.0000 g | Freq: Once | INTRAVENOUS | Status: AC
  Administered 2015-11-25: 2 g via INTRAVENOUS
  Filled 2015-11-25: qty 50

## 2015-11-25 NOTE — Progress Notes (Signed)
Subjective: Pt had worsening pain late yesterday pm. Film ordered which showed worsened sb dilation. Npo, NG re-inserted. States he feels about same. Still having some abd pain. Reports BM late yesterday with suppository. Perhaps some flatus. Has multiple questions. C/o of NG discomfort  Objective: Vital signs in last 24 hours: Temp:  [98.2 F (36.8 C)-99 F (37.2 C)] 98.6 F (37 C) 11/28/23 0645) Pulse Rate:  [41-49] 49 11/28/23 0645) Resp:  [18] 18 11/28/2023 0645) BP: (146-167)/(76-81) 160/81 mmHg 2023/11/28 0645) SpO2:  [100 %] 100 % 28-Nov-2023 0645) Last BM Date: 11/24/15  Intake/Output from previous day: 12/30 0701 11/28/23 0700 In: 2434.6 [P.O.:480; I.V.:1954.6] Out: 900 [Urine:400; Emesis/NG output:500] Intake/Output this shift:    Alert, nontoxic, not ill appearing cta  Reg Soft, mild distension, NON tender. Some BS NG - 800cc in can, clear to red tinged  Lab Results:   Recent Labs  11/24/15 1844 11-28-15 0500  WBC 3.0* 2.9*  HGB 12.8* 12.0*  HCT 39.1 37.3*  PLT 71* 100*   BMET  Recent Labs  11/24/15 0445 Nov 28, 2015 0115  NA 143 139  K 4.1 4.0  CL 109 105  CO2 28 26  GLUCOSE 121* 77  BUN 15 9  CREATININE 0.57* 0.49*  CALCIUM 9.2 8.7*   PT/INR No results for input(s): LABPROT, INR in the last 72 hours. ABG No results for input(s): PHART, HCO3 in the last 72 hours.  Invalid input(s): PCO2, PO2  Studies/Results: Dg Abd 2 Views  2015/11/28  CLINICAL DATA:  Small bowel obstruction.  Nausea. EXAM: ABDOMEN - 2 VIEW COMPARISON:  November 24, 2015. FINDINGS: There remains severe small bowel dilatation with air-fluid levels consistent with distal small bowel obstruction. Nasogastric tube tip is seen in expected position of distal esophagus. Vascular calcifications are noted. IMPRESSION: Continued presence of severe small bowel dilatation with air-fluid levels consistent with distal small bowel obstruction. Distal tip of nasogastric tube seen in expected position of  distal esophagus. Electronically Signed   By: Marijo Conception, M.D.   On: 11/28/2015 08:47   Dg Abd 2 Views  11/24/2015  CLINICAL DATA:  Patient had surgery this morning, 11/24/2015, for small-bowel obstruction. History of diabetes. EXAM: ABDOMEN - 2 VIEW COMPARISON:  Abdominal plain film from earlier same day. FINDINGS: There is increased distension of the small bowel, now with small bowel involvement throughout the abdomen and upper pelvis. Associated air-fluid levels on the decubitus images. IMPRESSION: Significantly increased distention of the small bowel, and now with small bowel involvement throughout the abdomen and upper pelvis. Dr. Erlinda Hong tells me that patient did NOT have surgery so these findings represent a significant worsening of small bowel obstruction, likely a complete small bowel obstruction. Immediate surgical consultation recommended. These results were called by telephone at the time of interpretation on 11/24/2015 at 3:35 pm to Dr. Florencia Reasons , who verbally acknowledged these results. Electronically Signed   By: Franki Cabot M.D.   On: 11/24/2015 15:39   Dg Abd 2 Views  11/24/2015  CLINICAL DATA:  Follow-up small bowel obstruction. Initial encounter. EXAM: ABDOMEN - 2 VIEW COMPARISON:  Abdominal radiograph performed 11/22/2015 FINDINGS: The small bowel is dilated to 5.2 cm in maximal diameter, again reflecting small bowel obstruction. There is a decreased amount of stool in the colon. No definite free intra-abdominal air is seen. A vascular stent is noted at the left hemipelvis. No acute osseous abnormalities are identified. The visualized lung bases are grossly clear. IMPRESSION: Small bowel dilatation to 5.2 cm  in maximal diameter, reflecting persistent small-bowel obstruction. No definite free intra-abdominal air seen. Electronically Signed   By: Garald Balding M.D.   On: 11/24/2015 00:38    Anti-infectives: Anti-infectives    None      Assessment/Plan: SBO  No fever. No  tachycardia. abd exam essentially benign. Film this am shows dilated SB loops, air fluid levels. NG in distal esoph  i advanced NG about 8 cm and his discomfort with NG resolved.   i would continue bowel rest, ng to LIWS. Can have sips of water and 1 [place] icee per day Repeat film in am  Will continue to follow  Leighton Ruff. Redmond Pulling, MD, FACS General, Bariatric, & Minimally Invasive Surgery [facility], [place]   LOS: 4 days    Gayland Curry 11/25/2015

## 2015-11-25 NOTE — Progress Notes (Addendum)
PROGRESS NOTE  [name] [name] [id number] DOB: 1951-02-04 DOA: 11/21/2015 PCP: Marijean Bravo, MD  HPI/Recap of past 24 hours:  Ng reinserted yesterday, still c/o ab pain  Assessment/Plan: Principal Problem:   SBO (small bowel obstruction) ([place]) Active Problems:   Tobacco abuse   Type 2 diabetes mellitus ([place])   CAD (coronary artery disease)   Proteinuria  SBO (small bowel obstruction) ([place]) NGT removed on 12/29, surgery started clears on 12/30, but not able to tolerate with increase ab pain, restarted ng suction,  keep IV fluids for another 24hrs, keep k>4, mag>2. Appreciate general surgery input, will f/u on general surgery rec's. May need to have surgery  Hypomagnesemia: replace mag.  Type 2 diabetes mellitus ([place]), previous diet controlled, not on meds a1c 6.9, last a1c in 06/2015 was 7.4, report not able to tolerate metformin.  CAD (coronary artery disease) with h/o NSTEMI om 2014, s/p DES to RCA Denies chest pain, has been refusing to take asa or plavix in the past. Closed followed by cardiology.  HLD: not interested in taking any meds.  PVD/ Nonhealing left dorsal foot ulcer and left great toe ulcer, s/p left femoral to below knee popliteal bypass on 07/28/2015, closed followed by vascular surgery Dr. Donnetta Hutching , general surgery Dr Mariea Clonts (left great toe debrided on 11/14/15 by dr Mariea Clonts) ,wound care.   H/o sinus bradycardia, asymptomatic, avoid nodal blocking agent.  H/o seizure, reported dilantin was topped a month ago, and he was started on neurontin, last seizure two years ago.  Proteinuria Consider starting low-dose ACE inhibitor. Patient to follow up with nephrology as an outpatient.  Chronic pain, need adjust pain meds  Tobacco abuse Smoking cessation education provided Nicotine replacement therapy ordered.  Code Status: Full code. DVT Prophylaxis: SCDs. Family Communication: patient Disposition Plan: not ready to be discharge, eventually home with  home health RN when medically stable    Consultants:  General surgery  Procedures:  Ng suction from admission, removed on 12/29, reinserted on 12/30  Antibiotics:  none   Objective: BP 160/81 mmHg  Pulse 49  Temp(Src) 98.6 F (37 C) (Oral)  Resp 18  Ht 6\' 2"  (1.88 m)  Wt 141 lb 8.6 oz (64.2 kg)  BMI 18.16 kg/m2  SpO2 100%  Intake/Output Summary (Last 24 hours) at 11/25/15 1351 Last data filed at 11/25/15 0700  Gross per 24 hour  Intake 1954.58 ml  Output    900 ml  Net 1054.58 ml   Filed Weights   11/21/15 2101  Weight: 141 lb 8.6 oz (64.2 kg)    Exam:   General:  NAD  Cardiovascular: RRR  Respiratory: CTABL  Abdomen: tender, distension, more localized distension and tender left lower quadrant with associated high pitched BS, mild line well healed surgical scar ( per patient from prior abdominal aorta surgery)  Musculoskeletal: left dorsal foot ulcer and left great toe ulcer (chronic, followed by vascular surgery Dr .Donnetta Hutching)  Neuro: aaox3  Data Reviewed: Basic Metabolic Panel:  Recent Labs Lab 11/21/15 1621 11/22/15 0452 11/23/15 0534 11/24/15 0445 11/25/15 0115  NA 139 138 142 143 139  K 3.7 4.1 4.4 4.1 4.0  CL 97* 99* 104 109 105  CO2 30 29 28 28 26   GLUCOSE 232* 125* 87 121* 77  BUN 13 15 19 15 9   CREATININE 0.74 0.63 0.69 0.57* 0.49*  CALCIUM 10.3 9.3 9.2 9.2 8.7*  MG  --  1.7 2.0 1.8 1.6*  PHOS  --  3.1  --   --   --  Liver Function Tests:  Recent Labs Lab 11/21/15 1621 11/22/15 0452  AST 36 22  ALT 32 24  ALKPHOS 66 57  BILITOT 1.1 0.6  PROT 7.7 6.2*  ALBUMIN 4.7 3.7    Recent Labs Lab 11/21/15 1621  LIPASE 21   No results for input(s): AMMONIA in the last 168 hours. CBC:  Recent Labs Lab 11/22/15 0452 11/23/15 0534 11/24/15 0445 11/24/15 1844 11/25/15 0500  WBC 5.4 3.7* 2.7* 3.0* 2.9*  NEUTROABS 3.7 1.8  --   --  0.9*  HGB 15.0 13.7 12.0* 12.8* 12.0*  HCT 45.4 42.6 36.3* 39.1 37.3*  MCV 88.7 90.4 91.9  89.9 89.4  PLT 121* 124* PLATELET CLUMPS NOTED ON SMEAR, UNABLE TO ESTIMATE 71* 100*   Cardiac Enzymes:   No results for input(s): CKTOTAL, CKMB, CKMBINDEX, TROPONINI in the last 168 hours. BNP (last 3 results) No results for input(s): BNP in the last 8760 hours.  ProBNP (last 3 results) No results for input(s): PROBNP in the last 8760 hours.  CBG:  Recent Labs Lab 11/25/15 0008 11/25/15 0640 11/25/15 0802 11/25/15 0854 11/25/15 1340  GLUCAP 93 69 67 73 90    No results found for this or any previous visit (from the past 240 hour(s)).   Studies: Dg Abd 2 Views  11/25/2015  CLINICAL DATA:  Small bowel obstruction.  Nausea. EXAM: ABDOMEN - 2 VIEW COMPARISON:  November 24, 2015. FINDINGS: There remains severe small bowel dilatation with air-fluid levels consistent with distal small bowel obstruction. Nasogastric tube tip is seen in expected position of distal esophagus. Vascular calcifications are noted. IMPRESSION: Continued presence of severe small bowel dilatation with air-fluid levels consistent with distal small bowel obstruction. Distal tip of nasogastric tube seen in expected position of distal esophagus. Electronically Signed   By: Marijo Conception, M.D.   On: 11/25/2015 08:47   Dg Abd 2 Views  11/24/2015  CLINICAL DATA:  Patient had surgery this morning, 11/24/2015, for small-bowel obstruction. History of diabetes. EXAM: ABDOMEN - 2 VIEW COMPARISON:  Abdominal plain film from earlier same day. FINDINGS: There is increased distension of the small bowel, now with small bowel involvement throughout the abdomen and upper pelvis. Associated air-fluid levels on the decubitus images. IMPRESSION: Significantly increased distention of the small bowel, and now with small bowel involvement throughout the abdomen and upper pelvis. Dr. Erlinda Hong tells me that patient did NOT have surgery so these findings represent a significant worsening of small bowel obstruction, likely a complete small bowel  obstruction. Immediate surgical consultation recommended. These results were called by telephone at the time of interpretation on 11/24/2015 at 3:35 pm to Dr. Florencia Reasons , who verbally acknowledged these results. Electronically Signed   By: Franki Cabot M.D.   On: 11/24/2015 15:39    Scheduled Meds: . aspirin EC  81 mg Oral Daily  . bacitracin   Topical Daily  . collagenase  1 application Topical Daily  . gabapentin  300 mg Oral TID  . pantoprazole (PROTONIX) IV  40 mg Intravenous Q12H  . polyethylene glycol  17 g Oral Daily  . sodium chloride  3 mL Intravenous Q12H    Continuous Infusions: . dextrose 5 % and 0.9 % NaCl with KCl 20 mEq/L 75 mL/hr at 11/25/15 0855     Time spent: 46mins  Himani Corona MD, PhD  [name] Pager [phone]. If 7PM-7AM, please contact night-coverage at [email or web address], password [facility] 11/25/2015, 1:51 PM  LOS: 4 days

## 2015-11-25 NOTE — Progress Notes (Signed)
Patient's BCG rechecked now 56. Asymptomatic. Patient educated and refuses intervention. Will continue to monitor.

## 2015-11-25 NOTE — Progress Notes (Signed)
CBG 69. Patient educated and refuses intervention. MD informed. Will continue to monitor.

## 2015-11-26 ENCOUNTER — Inpatient Hospital Stay (HOSPITAL_COMMUNITY): Payer: Medicaid Other

## 2015-11-26 LAB — BASIC METABOLIC PANEL
ANION GAP: 9 (ref 5–15)
BUN: 10 mg/dL (ref 6–20)
CHLORIDE: 105 mmol/L (ref 101–111)
CO2: 27 mmol/L (ref 22–32)
CREATININE: 0.55 mg/dL — AB (ref 0.61–1.24)
Calcium: 8.8 mg/dL — ABNORMAL LOW (ref 8.9–10.3)
GLUCOSE: 126 mg/dL — AB (ref 65–99)
POTASSIUM: 4 mmol/L (ref 3.5–5.1)
Sodium: 141 mmol/L (ref 135–145)

## 2015-11-26 LAB — CBC
HEMATOCRIT: 37.6 % — AB (ref 39.0–52.0)
HEMOGLOBIN: 12.2 g/dL — AB (ref 13.0–17.0)
MCH: 29 pg (ref 26.0–34.0)
MCHC: 32.4 g/dL (ref 30.0–36.0)
MCV: 89.5 fL (ref 78.0–100.0)
Platelets: 118 10*3/uL — ABNORMAL LOW (ref 150–400)
RBC: 4.2 MIL/uL — ABNORMAL LOW (ref 4.22–5.81)
RDW: 14.5 % (ref 11.5–15.5)
WBC: 3.9 10*3/uL — ABNORMAL LOW (ref 4.0–10.5)

## 2015-11-26 LAB — LACTIC ACID, PLASMA: Lactic Acid, Venous: 0.7 mmol/L (ref 0.5–2.0)

## 2015-11-26 LAB — GLUCOSE, CAPILLARY
GLUCOSE-CAPILLARY: 105 mg/dL — AB (ref 65–99)
GLUCOSE-CAPILLARY: 126 mg/dL — AB (ref 65–99)
GLUCOSE-CAPILLARY: 92 mg/dL (ref 65–99)
Glucose-Capillary: 106 mg/dL — ABNORMAL HIGH (ref 65–99)

## 2015-11-26 LAB — SEDIMENTATION RATE: SED RATE: 5 mm/h (ref 0–16)

## 2015-11-26 LAB — C-REACTIVE PROTEIN: CRP: 10.5 mg/dL — ABNORMAL HIGH (ref <1.0)

## 2015-11-26 LAB — MAGNESIUM: MAGNESIUM: 1.7 mg/dL (ref 1.7–2.4)

## 2015-11-26 MED ORDER — MINERAL OIL RE ENEM
1.0000 | ENEMA | Freq: Once | RECTAL | Status: AC
  Administered 2015-11-26: 1 via RECTAL
  Filled 2015-11-26: qty 1

## 2015-11-26 MED ORDER — DIPHENHYDRAMINE HCL 12.5 MG/5ML PO ELIX: 12.5000 mg | ORAL_SOLUTION | Freq: Four times a day (QID) | ORAL | Status: DC | PRN

## 2015-11-26 MED ORDER — SODIUM CHLORIDE 0.9 % IJ SOLN: 9.0000 mL | INTRAMUSCULAR | Status: DC | PRN

## 2015-11-26 MED ORDER — HYDROMORPHONE 1 MG/ML IV SOLN
INTRAVENOUS | Status: DC
  Administered 2015-11-26: 3 mg via INTRAVENOUS
  Administered 2015-11-26: 17:00:00 via INTRAVENOUS
  Administered 2015-11-27: 3 mg via INTRAVENOUS
  Administered 2015-11-27: 5 mg via INTRAVENOUS
  Administered 2015-11-27: 09:00:00 via INTRAVENOUS
  Administered 2015-11-27: 3 mg via INTRAVENOUS
  Filled 2015-11-26 (×2): qty 25

## 2015-11-26 MED ORDER — VITAMINS A & D EX OINT
TOPICAL_OINTMENT | CUTANEOUS | Status: AC
  Administered 2015-11-26: 10:00:00
  Filled 2015-11-26: qty 5

## 2015-11-26 MED ORDER — NALOXONE HCL 0.4 MG/ML IJ SOLN: 0.4000 mg | INTRAMUSCULAR | Status: DC | PRN

## 2015-11-26 MED ORDER — DIPHENHYDRAMINE HCL 50 MG/ML IJ SOLN: 12.5000 mg | Freq: Four times a day (QID) | INTRAMUSCULAR | Status: DC | PRN

## 2015-11-26 MED ORDER — ONDANSETRON HCL 4 MG/2ML IJ SOLN
4.0000 mg | Freq: Four times a day (QID) | INTRAMUSCULAR | Status: DC | PRN
  Administered 2015-11-27: 4 mg via INTRAVENOUS
  Filled 2015-11-26: qty 2

## 2015-11-26 NOTE — Progress Notes (Signed)
Subjective: Less abd pain today but no flatus/bm.   Objective: Vital signs in last 24 hours: Temp:  [97.3 F (36.3 C)-98.9 F (37.2 C)] 98.3 F (36.8 C) (01/01 0656) Pulse Rate:  [47-51] 51 (01/01 0656) Resp:  [18-20] 18 (01/01 0656) BP: (140-162)/(63-78) 140/63 mmHg (01/01 0656) SpO2:  [94 %-100 %] 94 % (01/01 0656) Last BM Date: 2015/12/01  Intake/Output from previous day: 12/01/23 0701 - 01/01 0700 In: -  Out: 1300 [Urine:500; Emesis/NG output:800] Intake/Output this shift:    Alert, nontoxic cta  Reg Soft, nt, some distension, hypoBS  Lab Results:   Recent Labs  01-Dec-2015 0500 11/26/15 0545  WBC 2.9* 3.9*  HGB 12.0* 12.2*  HCT 37.3* 37.6*  PLT 100* 118*   BMET  Recent Labs  Dec 01, 2015 0115 11/26/15 0545  NA 139 141  K 4.0 4.0  CL 105 105  CO2 26 27  GLUCOSE 77 126*  BUN 9 10  CREATININE 0.49* 0.55*  CALCIUM 8.7* 8.8*   PT/INR No results for input(s): LABPROT, INR in the last 72 hours. ABG No results for input(s): PHART, HCO3 in the last 72 hours.  Invalid input(s): PCO2, PO2  Studies/Results: Dg Abd 2 Views  12/01/15  CLINICAL DATA:  Small bowel obstruction.  Nausea. EXAM: ABDOMEN - 2 VIEW COMPARISON:  November 24, 2015. FINDINGS: There remains severe small bowel dilatation with air-fluid levels consistent with distal small bowel obstruction. Nasogastric tube tip is seen in expected position of distal esophagus. Vascular calcifications are noted. IMPRESSION: Continued presence of severe small bowel dilatation with air-fluid levels consistent with distal small bowel obstruction. Distal tip of nasogastric tube seen in expected position of distal esophagus. Electronically Signed   By: Marijo Conception, M.D.   On: 2015-12-01 08:47   Dg Abd 2 Views  11/24/2015  CLINICAL DATA:  Patient had surgery this morning, 11/24/2015, for small-bowel obstruction. History of diabetes. EXAM: ABDOMEN - 2 VIEW COMPARISON:  Abdominal plain film from earlier same day.  FINDINGS: There is increased distension of the small bowel, now with small bowel involvement throughout the abdomen and upper pelvis. Associated air-fluid levels on the decubitus images. IMPRESSION: Significantly increased distention of the small bowel, and now with small bowel involvement throughout the abdomen and upper pelvis. Dr. Erlinda Hong tells me that patient did NOT have surgery so these findings represent a significant worsening of small bowel obstruction, likely a complete small bowel obstruction. Immediate surgical consultation recommended. These results were called by telephone at the time of interpretation on 11/24/2015 at 3:35 pm to Dr. Florencia Reasons , who verbally acknowledged these results. Electronically Signed   By: Franki Cabot M.D.   On: 11/24/2015 15:39   Dg Abd Portable 2v  11/26/2015  CLINICAL DATA:  Small bowel obstruction and abdominal pain. EXAM: PORTABLE ABDOMEN - 2 VIEW COMPARISON:  Dec 01, 2015 and multiple recent abdominal films as well as CT of the abdomen and pelvis on 11/21/2015. FINDINGS: Since the prior examination the nasogastric tube is been advanced into the stomach. Multiple dilated loops of small bowel containing gas are again identified primarily involving proximal jejunal loops. Degree of small bowel dilatation appears slightly improved. No evidence of free intraperitoneal air. The colon remains decompressed. IMPRESSION: Slightly improved small bowel dilatation. Electronically Signed   By: Aletta Edouard M.D.   On: 11/26/2015 09:37    Anti-infectives: Anti-infectives    None      Assessment/Plan: pSBO  Still obstipated. Plain films may be a little better. Discussed indications for surgery  which would be worsening of obstruction vs no-improvement. Pt is not worse - no fever, no tachycardia, exam stable, normal lactate, wbc ok. However he hasn't made a lot of progress.   Pt states as of now he is not interested in surgery.   Will proceed with PICC placement/TPN. Awaiting  confirmation that PICC can be placed today before ordering TPN.   Leighton Ruff. Redmond Pulling, MD, FACS General, Bariatric, & Minimally Invasive Surgery [facility], [place]   LOS: 5 days    Gayland Curry 11/26/2015

## 2015-11-26 NOTE — Progress Notes (Signed)
PARENTERAL NUTRITION CONSULT NOTE - INITIAL  Pharmacy Consult for TPN Indication: Bowel obstruction  Allergies  Allergen Reactions  . Zetia [Ezetimibe] Anaphylaxis and Swelling    Tongue and throat   . Atorvastatin Other (See Comments)    Malaise & muscle weakness  . Penicillins Other (See Comments)    Unknown  . Pravastatin Sodium     Pravastatin 40 mg qday and 40 mg q M/W/F caused muscle aches    Patient Measurements: Height: 6\' 2"  (188 cm) Weight: 141 lb 8.6 oz (64.2 kg) IBW/kg (Calculated) : 82.2 Usual Weight: 137-140 lbs  Vital Signs: Temp: 98.3 F (36.8 C) (01/01 0656) Temp Source: Oral (01/01 0656) BP: 140/63 mmHg (01/01 0656) Pulse Rate: 51 (01/01 0656) Intake/Output from previous day: 12/31 0701 - 01/01 0700 In: -  Out: 1300 [Urine:500; Emesis/NG output:800] Intake/Output from this shift:    Labs:  Recent Labs  11/24/15 1844 11/25/15 0500 11/26/15 0545  WBC 3.0* 2.9* 3.9*  HGB 12.8* 12.0* 12.2*  HCT 39.1 37.3* 37.6*  PLT 71* 100* 118*     Recent Labs  11/24/15 0445 11/25/15 0115 11/26/15 0545  NA 143 139 141  K 4.1 4.0 4.0  CL 109 105 105  CO2 28 26 27   GLUCOSE 121* 77 126*  BUN 15 9 10   CREATININE 0.57* 0.49* 0.55*  CALCIUM 9.2 8.7* 8.8*  MG 1.8 1.6* 1.7  TRIG 110  --   --   CHOLHDL 4.3  --   --   CHOL 154  --   --    Estimated Creatinine Clearance: 85.8 mL/min (by C-G formula based on Cr of 0.55).    Recent Labs  11/25/15 1340 11/25/15 1745 11/25/15 2358  GLUCAP 90 99 92    Medical History: Past Medical History  Diagnosis Date  . Hyperlipidemia   . Hypertension   . Leg pain   . Peripheral vascular disease ([place])     s/p prior Ao-Bifem BPG  . 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 Coronary artery disease     a. NSTEMI 7/14=> LHC 06/07/13: Proximal LAD 30%, ostial D1 30%, AV circumflex 80%, then occluded, ostial OM2 occluded, left to left collaterals, mid RCA 95%,  inferior HK, EF 60%. PCI: Promus DES to the mid RCA.  No intervention was recommended for the CTO circumflex.  . Ischemic cardiomyopathy     a. Echocardiogram 06/06/13: EF 35-40%, inferior AK, mild MR.  Marland Kitchen Shortness of breath     "can come on at anytime lately; before that it was just w/exertion" (11/10/2013)  . Type II diabetes mellitus ([place])   . [place] mal seizure [facility])     "last one was ~ 3 yr ago; controlled w/daily RX" (11/10/2013)  . Arthritis     "neck, back, legs" (11/10/2013)  . Kidney stones     "when I was younger; passed w/o treatment" (11/10/2013)  . Myocardial infarction ([place])   . Diabetic neuropathy ([place])   . H. pylori infection   . Wears glasses     Medications:  Infusions:  . dextrose 5 % and 0.9 % NaCl with KCl 20 mEq/L 75 mL/hr at 11/25/15 2245    Insulin Requirements in the past 24 hours: None  Current Nutrition: NPO  IVF: D5NS KCl 20 meq/L @ 75 ml/hr  Central access: PICC ordered 1/1 (pt refused) TPN start date: pending PICC line placement  ASSESSMENT  HPI: 68 yoM presented to ED on 12/27 with abdominal pain.  PMH includes HTN, HLD, PVD - s/p fem bypass, CAD s/p NSTEMI 2014, cardiomyopathy, DM, diabetic peripheral neuropathy, seizures, and chronic pain on chronic opioids.  Surgical history significant for open AAA repair.  CT shows partial SBO, likely adhesions.  Pt was made NPO and NGT inserted.  Today, 1/1, pt has not made much progress, no surgical plans.  Pharmacy is consulted to start TPN when PICC line can be placed.  Significant events:  1/1 TPN per pharmacy ordered - unable to start without central access.    Today:   Glucose - at goal < 150 (Hx DM on glipizide PTA)  Electrolytes - WNL  Renal - SCr low  LFTs - pending collection  TGs - pending collection  Prealbumin - pending collection  NUTRITIONAL GOALS                                                                                              RD recs (12/28): 1600-1800 Kcal/day, 65-80 g protein/day, > 2 L fluid/day  Clinimix 5/20 at a goal rate of 60 ml/hr + 20% fat emulsion at 10 ml/hr to provide: 72 g/day protein, 1746 Kcal/day.  PLAN                                                                                                                          Pharmacy to dose TPN when PICC line has been placed  TPN lab panels on Mondays & Thursdays.  F/u daily.   Gretta Arab PharmD, BCPS Pager [phone] 11/26/2015 10:27 AM

## 2015-11-26 NOTE — Progress Notes (Signed)
Dr. Redmond Pulling notified of pt refusal for PICC line insertion.

## 2015-11-26 NOTE — Progress Notes (Addendum)
PROGRESS NOTE  [name] [name] [id number] DOB: December 21, 1950 DOA: 11/21/2015 PCP: Marijean Bravo, MD  HPI/Recap of past 24 hours:  Ng reinserted yesterday, no bm, no flatus, still c/o ab pain, reluctant to have surgery  Assessment/Plan: Principal Problem:   SBO (small bowel obstruction) ([place]) Active Problems:   Tobacco abuse   Type 2 diabetes mellitus ([place])   CAD (coronary artery disease)   Proteinuria  SBO (small bowel obstruction) ([place]) NGT removed on 12/29, surgery started clears on 12/30, but not able to tolerate with increase ab pain, restarted ng suction,  keep IV fluids keep k>4, mag>2. Surgery started patient on TPN, repeat imaging per surgery, Appreciate general surgery input,  Patient agreed to another enema on 1/1  4pm, Addendum: worsening of ab pain, asking for pain meds more frequently, not happy, very agitated, still refusing picc line, will start dilaudid pca, repeat ab x ray.  Hypomagnesemia: replace mag.  Type 2 diabetes mellitus ([place]), previous diet controlled, not on meds a1c 6.9, last a1c in 06/2015 was 7.4, report not able to tolerate metformin.  CAD (coronary artery disease) with h/o NSTEMI om 2014, s/p DES to RCA Denies chest pain, has been refusing to take asa or plavix in the past. Closed followed by cardiology.  HLD: not interested in taking any meds.  PVD/ Nonhealing left dorsal foot ulcer and left great toe ulcer, s/p left femoral to below knee popliteal bypass on 07/28/2015, closed followed by vascular surgery Dr. Donnetta Hutching , general surgery Dr Mariea Clonts (left great toe debrided on 11/14/15 by dr Mariea Clonts) ,wound care.   H/o sinus bradycardia, asymptomatic, avoid nodal blocking agent.  H/o seizure, reported dilantin was topped a month ago, and he was started on neurontin, last seizure two years ago.  Proteinuria Consider starting low-dose ACE inhibitor. Patient to follow up with nephrology as an outpatient.  Chronic pain, need adjust pain  meds  Tobacco abuse Smoking cessation education provided Nicotine replacement therapy ordered.  Code Status: Full code. DVT Prophylaxis: SCDs. Family Communication: patient Disposition Plan: not ready to be discharge, eventually home with home health RN when medically stable    Consultants:  General surgery  Procedures:  Ng suction from admission, removed on 12/29, reinserted on 12/30  Antibiotics:  none   Objective: BP 140/63 mmHg  Pulse 51  Temp(Src) 98.3 F (36.8 C) (Oral)  Resp 18  Ht 6\' 2"  (1.88 m)  Wt 141 lb 8.6 oz (64.2 kg)  BMI 18.16 kg/m2  SpO2 94%  Intake/Output Summary (Last 24 hours) at 11/26/15 1342 Last data filed at 11/25/15 2000  Gross per 24 hour  Intake      0 ml  Output   1300 ml  Net  -1300 ml   Filed Weights   11/21/15 2101  Weight: 141 lb 8.6 oz (64.2 kg)    Exam:   General:  NAD, no ng suction  Cardiovascular: RRR  Respiratory: CTABL  Abdomen: tender, distension, more localized distension and tender left lower quadrant with associated high pitched BS, mild line well healed surgical scar ( per patient from prior abdominal aorta surgery)  Musculoskeletal: left dorsal foot ulcer and left great toe ulcer (chronic, followed by vascular surgery Dr .Donnetta Hutching)  Neuro: aaox3  Data Reviewed: Basic Metabolic Panel:  Recent Labs Lab 11/22/15 0452 11/23/15 0534 11/24/15 0445 11/25/15 0115 11/26/15 0545  NA 138 142 143 139 141  K 4.1 4.4 4.1 4.0 4.0  CL 99* 104 109 105 105  CO2 29 28 28  26 27  GLUCOSE 125* 87 121* 77 126*  BUN 15 19 15 9 10   CREATININE 0.63 0.69 0.57* 0.49* 0.55*  CALCIUM 9.3 9.2 9.2 8.7* 8.8*  MG 1.7 2.0 1.8 1.6* 1.7  PHOS 3.1  --   --   --   --    Liver Function Tests:  Recent Labs Lab 11/21/15 1621 11/22/15 0452  AST 36 22  ALT 32 24  ALKPHOS 66 57  BILITOT 1.1 0.6  PROT 7.7 6.2*  ALBUMIN 4.7 3.7    Recent Labs Lab 11/21/15 1621  LIPASE 21   No results for input(s): AMMONIA in the last  168 hours. CBC:  Recent Labs Lab 11/22/15 0452 11/23/15 0534 11/24/15 0445 11/24/15 1844 11/25/15 0500 11/26/15 0545  WBC 5.4 3.7* 2.7* 3.0* 2.9* 3.9*  NEUTROABS 3.7 1.8  --   --  0.9*  --   HGB 15.0 13.7 12.0* 12.8* 12.0* 12.2*  HCT 45.4 42.6 36.3* 39.1 37.3* 37.6*  MCV 88.7 90.4 91.9 89.9 89.4 89.5  PLT 121* 124* PLATELET CLUMPS NOTED ON SMEAR, UNABLE TO ESTIMATE 71* 100* 118*   Cardiac Enzymes:   No results for input(s): CKTOTAL, CKMB, CKMBINDEX, TROPONINI in the last 168 hours. BNP (last 3 results) No results for input(s): BNP in the last 8760 hours.  ProBNP (last 3 results) No results for input(s): PROBNP in the last 8760 hours.  CBG:  Recent Labs Lab 11/25/15 0854 11/25/15 1340 11/25/15 1745 11/25/15 2358 11/26/15 1147  GLUCAP 73 90 99 92 126*    No results found for this or any previous visit (from the past 240 hour(s)).   Studies: Dg Abd Portable 2v  11/26/2015  CLINICAL DATA:  Small bowel obstruction and abdominal pain. EXAM: PORTABLE ABDOMEN - 2 VIEW COMPARISON:  11/25/2015 and multiple recent abdominal films as well as CT of the abdomen and pelvis on 11/21/2015. FINDINGS: Since the prior examination the nasogastric tube is been advanced into the stomach. Multiple dilated loops of small bowel containing gas are again identified primarily involving proximal jejunal loops. Degree of small bowel dilatation appears slightly improved. No evidence of free intraperitoneal air. The colon remains decompressed. IMPRESSION: Slightly improved small bowel dilatation. Electronically Signed   By: Aletta Edouard M.D.   On: 11/26/2015 09:37    Scheduled Meds: . bacitracin   Topical Daily  . collagenase  1 application Topical Daily  . mineral oil  1 enema Rectal Once  . pantoprazole (PROTONIX) IV  40 mg Intravenous Q12H  . sodium chloride  3 mL Intravenous Q12H    Continuous Infusions: . dextrose 5 % and 0.9 % NaCl with KCl 20 mEq/L 75 mL/hr at 11/26/15 1224     Time  spent: 71mins, more then 50% spent on discuss the case with patient,  Patient has been refusing picc line, refusing surgery, he repeated ask to eat something, explained to him eating only make situation worse, he needs alternative nutrition. Patient does not seem to have good insight in the disease process, agitated at times  Sairah Knobloch MD, PhD  [name] Pager [phone]. If 7PM-7AM, please contact night-coverage at [email or web address], password [facility] 11/26/2015, 1:42 PM  LOS: 5 days

## 2015-11-26 NOTE — Progress Notes (Signed)
Discuss risk and benefits of PICC line insertion. States understands such and refused PICC insertion.

## 2015-11-27 ENCOUNTER — Inpatient Hospital Stay (HOSPITAL_COMMUNITY): Payer: Medicaid Other

## 2015-11-27 LAB — CBC
HEMATOCRIT: 39.1 % (ref 39.0–52.0)
HEMOGLOBIN: 12.5 g/dL — AB (ref 13.0–17.0)
MCH: 28.9 pg (ref 26.0–34.0)
MCHC: 32 g/dL (ref 30.0–36.0)
MCV: 90.3 fL (ref 78.0–100.0)
Platelets: 122 10*3/uL — ABNORMAL LOW (ref 150–400)
RBC: 4.33 MIL/uL (ref 4.22–5.81)
RDW: 14.5 % (ref 11.5–15.5)
WBC: 5.4 10*3/uL (ref 4.0–10.5)

## 2015-11-27 LAB — COMPREHENSIVE METABOLIC PANEL
ALBUMIN: 3.7 g/dL (ref 3.5–5.0)
ALT: 24 U/L (ref 17–63)
AST: 29 U/L (ref 15–41)
Alkaline Phosphatase: 57 U/L (ref 38–126)
Anion gap: 10 (ref 5–15)
BUN: 12 mg/dL (ref 6–20)
CHLORIDE: 106 mmol/L (ref 101–111)
CO2: 27 mmol/L (ref 22–32)
CREATININE: 0.62 mg/dL (ref 0.61–1.24)
Calcium: 9.4 mg/dL (ref 8.9–10.3)
GFR calc Af Amer: >60 mL/min (ref >60)
GFR calc non Af Amer: >60 mL/min (ref >60)
Glucose, Bld: 123 mg/dL — ABNORMAL HIGH (ref 65–99)
Potassium: 4.1 mmol/L (ref 3.5–5.1)
SODIUM: 143 mmol/L (ref 135–145)
Total Bilirubin: 1.4 mg/dL — ABNORMAL HIGH (ref 0.3–1.2)
Total Protein: 6.5 g/dL (ref 6.5–8.1)

## 2015-11-27 LAB — GLUCOSE, CAPILLARY
GLUCOSE-CAPILLARY: 108 mg/dL — AB (ref 65–99)
GLUCOSE-CAPILLARY: 113 mg/dL — AB (ref 65–99)
Glucose-Capillary: 89 mg/dL (ref 65–99)
Glucose-Capillary: 101 mg/dL — ABNORMAL HIGH (ref 65–99)

## 2015-11-27 LAB — DIFFERENTIAL
BASOS ABS: 0 10*3/uL (ref 0.0–0.1)
Basophils Relative: 0 %
EOS ABS: 0.1 10*3/uL (ref 0.0–0.7)
Eosinophils Relative: 2 %
LYMPHS ABS: 0.8 10*3/uL (ref 0.7–4.0)
LYMPHS PCT: 15 %
MONOS PCT: 21 %
Monocytes Absolute: 1.1 10*3/uL — ABNORMAL HIGH (ref 0.1–1.0)
Neutro Abs: 3.4 10*3/uL (ref 1.7–7.7)
Neutrophils Relative %: 62 %

## 2015-11-27 LAB — MAGNESIUM: Magnesium: 1.7 mg/dL (ref 1.7–2.4)

## 2015-11-27 LAB — TRIGLYCERIDES: Triglycerides: 124 mg/dL (ref <150)

## 2015-11-27 LAB — PREALBUMIN: Prealbumin: 3.7 mg/dL — ABNORMAL LOW (ref 18–38)

## 2015-11-27 LAB — PHOSPHORUS: PHOSPHORUS: 3.1 mg/dL (ref 2.5–4.6)

## 2015-11-27 MED ORDER — LORAZEPAM 2 MG/ML IJ SOLN
0.5000 mg | Freq: Once | INTRAMUSCULAR | Status: AC
  Administered 2015-11-27: 0.5 mg via INTRAVENOUS
  Filled 2015-11-27: qty 1

## 2015-11-27 MED ORDER — MAGNESIUM SULFATE 2 GM/50ML IV SOLN
2.0000 g | Freq: Once | INTRAVENOUS | Status: AC
  Administered 2015-11-27: 2 g via INTRAVENOUS
  Filled 2015-11-27: qty 50

## 2015-11-27 NOTE — Progress Notes (Signed)
PARENTERAL NUTRITION CONSULT NOTE - INITIAL  Pharmacy Consult for TPN Indication: Bowel obstruction  Allergies  Allergen Reactions  . Zetia [Ezetimibe] Anaphylaxis and Swelling    Tongue and throat   . Atorvastatin Other (See Comments)    Malaise & muscle weakness  . Penicillins Other (See Comments)    Unknown  . Pravastatin Sodium     Pravastatin 40 mg qday and 40 mg q M/W/F caused muscle aches    Patient Measurements: Height: 6\' 2"  (188 cm) Weight: 141 lb 8.6 oz (64.2 kg) IBW/kg (Calculated) : 82.2 Usual Weight: 137-140 lbs  Vital Signs: Temp: 98.4 F (36.9 C) (01/02 0436) Temp Source: Oral (01/02 0436) BP: 135/83 mmHg (01/02 0436) Pulse Rate: 61 (01/02 0436) Intake/Output from previous day: 01/01 0701 - 01/02 0700 In: 2856.3 [I.V.:2856.3] Out: 500 [Urine:500] Intake/Output from this shift:    Labs:  Recent Labs  11/25/15 0500 11/26/15 0545 11/27/15 0555  WBC 2.9* 3.9* 5.4  HGB 12.0* 12.2* 12.5*  HCT 37.3* 37.6* 39.1  PLT 100* 118* 122*     Recent Labs  11/25/15 0115 11/26/15 0545 11/27/15 0555  NA 139 141 143  K 4.0 4.0 4.1  CL 105 105 106  CO2 26 27 27   GLUCOSE 77 126* 123*  BUN 9 10 12   CREATININE 0.49* 0.55* 0.62  CALCIUM 8.7* 8.8* 9.4  MG 1.6* 1.7 1.7  PHOS  --   --  3.1  PROT  --   --  6.5  ALBUMIN  --   --  3.7  AST  --   --  29  ALT  --   --  24  ALKPHOS  --   --  57  BILITOT  --   --  1.4*   Estimated Creatinine Clearance: 85.8 mL/min (by C-G formula based on Cr of 0.62).    Recent Labs  11/26/15 1712 11/26/15 2234 11/27/15 0003  GLUCAP 105* 106* 113*    Medical History: Past Medical History  Diagnosis Date  . Hyperlipidemia   . Hypertension   . Leg pain   . Peripheral vascular disease ([place])     s/p prior Ao-Bifem BPG  . 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 Coronary artery disease     a. NSTEMI 7/14=> LHC 06/07/13: Proximal LAD 30%, ostial D1 30%, AV  circumflex 80%, then occluded, ostial OM2 occluded, left to left collaterals, mid RCA 95%, inferior HK, EF 60%. PCI: Promus DES to the mid RCA.  No intervention was recommended for the CTO circumflex.  . Ischemic cardiomyopathy     a. Echocardiogram 06/06/13: EF 35-40%, inferior AK, mild MR.  Marland Kitchen Shortness of breath     "can come on at anytime lately; before that it was just w/exertion" (11/10/2013)  . Type II diabetes mellitus ([place])   . [place] mal seizure [facility])     "last one was ~ 3 yr ago; controlled w/daily RX" (11/10/2013)  . Arthritis     "neck, back, legs" (11/10/2013)  . Kidney stones     "when I was younger; passed w/o treatment" (11/10/2013)  . Myocardial infarction ([place])   . Diabetic neuropathy ([place])   . H. pylori infection   . Wears glasses     Medications:  Infusions:  . dextrose 5 % and 0.9 % NaCl with KCl 20 mEq/L 75 mL/hr at 11/26/15 1224    Insulin Requirements in the past 24 hours: None Hx  DM  Current Nutrition: NPO  IVF: D5NS KCl 20 meq/L @ 75 ml/hr  Central access: PICC ordered 1/1 (pt refused) TPN start date: pending PICC line placement  ASSESSMENT                                                                                                          HPI: 48 yoM presented to ED on 12/27 with abdominal pain.  PMH includes HTN, HLD, PVD - s/p fem bypass, CAD s/p NSTEMI 2014, cardiomyopathy, DM, diabetic peripheral neuropathy, seizures, and chronic pain on chronic opioids.  Surgical history significant for open AAA repair.  CT shows partial SBO, likely adhesions.  Pt was made NPO and NGT inserted.  Today, 1/1, pt has not made much progress, no surgical plans.  Pharmacy is consulted to start TPN when PICC line can be placed.  Significant events:  1/1 TPN per pharmacy ordered - unable to start without central access.    Today:   Glucose - at goal < 150 (Hx DM on glipizide PTA)  Electrolytes - Mag 1.7 (s/p mag sulfate 2 gm on 12/31) , all other lytes wnl  Renal  - SCr wnl  LFTs - AST/ALT wnl, Tbili slightly elevated at 1.4  TGs - 110 (12/30)  Prealbumin - pending collection  NUTRITIONAL GOALS                                                                                             RD recs: 1600-1800 Kcal/day, 65-80 g protein/day, > 2 L fluid/day  Clinimix E 5/15 at a goal rate of 65 ml/hr + 20% fat emulsion at 10 ml/hr to provide: 78 g/day protein, 1588 Kcal/day.  PLAN                                                                                                                          Patient's currently refusing PICC placement  Pharmacy to dose TPN when PICC line has been placed  Will enter TPN lab panels on Mondays & Thursdays once TPN has been initiated  F/u daily.  Dia Sitter, PharmD, BCPS 11/27/2015 7:36 AM

## 2015-11-27 NOTE — Progress Notes (Signed)
Subjective: No bowel function per pt  Objective: Vital signs in last 24 hours: Temp:  [98.4 F (36.9 C)-99.4 F (37.4 C)] 98.4 F (36.9 C) December 25, 2022 0436) Pulse Rate:  [58-65] 61 12/25/22 0436) Resp:  [11-20] 20 12-25-2022 0833) BP: (129-152)/(73-86) 135/83 mmHg December 25, 2022 0436) SpO2:  [94 %-100 %] 98 % 12-25-22 0833) Last BM Date: 11/25/15  Intake/Output from previous day: 01/01 0701 - 12-25-2022 0700 In: 3506.3 [I.V.:3456.3; NG/GT:50] Out: 800 [Urine:500; Emesis/NG output:300] Intake/Output this shift: Total I/O In: -  Out: 300 [Emesis/NG output:300]  Alert, nontoxic, not ill appearing cta  Reg Soft, mild distension, NON tender. Some BS NG - clear to red tinged  Lab Results:   Recent Labs  11/26/15 0545 December 26, 2015 0555  WBC 3.9* 5.4  HGB 12.2* 12.5*  HCT 37.6* 39.1  PLT 118* 122*   BMET  Recent Labs  11/26/15 0545 12/26/2015 0555  NA 141 143  K 4.0 4.1  CL 105 106  CO2 27 27  GLUCOSE 126* 123*  BUN 10 12  CREATININE 0.55* 0.62  CALCIUM 8.8* 9.4   PT/INR No results for input(s): LABPROT, INR in the last 72 hours. ABG No results for input(s): PHART, HCO3 in the last 72 hours.  Invalid input(s): PCO2, PO2  Studies/Results: Dg Abd 2 Views  12-26-2015  CLINICAL DATA:  Generalized abdominal pain with nausea vomiting. EXAM: ABDOMEN - 2 VIEW COMPARISON:  11/26/2015 FINDINGS: Upright film shows no evidence for intraperitoneal free air. Gas-filled dilated small bowel loops in the upper abdomen persist but are slightly decreased in distention since yesterday's exam. NG tube tip is in the proximal stomach with proximal port of the NG tube in the region of the GE junction. IMPRESSION: Slight interval improvement in the proximal small bowel dilatation. Electronically Signed   By: Misty Stanley M.D.   On: 2015-12-26 10:00   Dg Abd 2 Views  11/26/2015  CLINICAL DATA:  Abdominal pain, distension, and weakness. Small bowel obstruction. EXAM: ABDOMEN - 2 VIEW COMPARISON:  11/26/2015  FINDINGS: Nasogastric tube enters the stomach. Dilated loops of small bowel up to 5.8 cm in diameter, formerly 5.1 cm. Air-fluid levels are present in loops of bowel, some and differing vertical levels within the same loop. Indistinct airspace opacity in the retro diaphragmatic region on the left. Trace left pleural effusion. No free intraperitoneal gas observed beneath the hemidiaphragms. Left iliac stent. Possible gas density projecting over the pubis, significance uncertain. IMPRESSION: 1. Worsening small bowel obstruction, dilated loops up to 5.8 cm in diameter currently. 2. Airspace opacity in the left lower lobe with trace left pleural effusion. Electronically Signed   By: Van Clines M.D.   On: 11/26/2015 18:11   Dg Abd Portable 2v  11/26/2015  CLINICAL DATA:  Small bowel obstruction and abdominal pain. EXAM: PORTABLE ABDOMEN - 2 VIEW COMPARISON:  11/25/2015 and multiple recent abdominal films as well as CT of the abdomen and pelvis on 11/21/2015. FINDINGS: Since the prior examination the nasogastric tube is been advanced into the stomach. Multiple dilated loops of small bowel containing gas are again identified primarily involving proximal jejunal loops. Degree of small bowel dilatation appears slightly improved. No evidence of free intraperitoneal air. The colon remains decompressed. IMPRESSION: Slightly improved small bowel dilatation. Electronically Signed   By: Aletta Edouard M.D.   On: 11/26/2015 09:37    Anti-infectives: Anti-infectives    None      Assessment/Plan: SBO  No fever. No tachycardia. abd exam essentially benign. Film this am shows  dilated SB loops, air fluid levels.   Pt refused PICC for TPN yesterday.  Refusing surgery as well.  When asked what he'd like to do to get nutrition, he told me he would think about it and get back to [place] tom am  Rosario Adie, MD  [facility]    LOS: 6 days    [place], Gifford Shave. [phone]

## 2015-11-27 NOTE — Progress Notes (Signed)
Nutrition Follow-up  DOCUMENTATION CODES:   Underweight  INTERVENTION:   Estimated needs provided below when/if patient has PICC placed. RD to continue to monitor for plan of TPN vs. Diet advancment  NUTRITION DIAGNOSIS:   Inadequate oral intake related to inability to eat as evidenced by NPO status.  Ongoing.  GOAL:   Patient will meet greater than or equal to 90% of their needs  Not meeting.  MONITOR:   Diet advancement, Weight trends, Labs, Skin, I & O's  REASON FOR ASSESSMENT:   Consult New TPN/TNA  ASSESSMENT:   65 y.o. male with a past medical history of hyperlipidemia, hypertension, CAD, type 2 diabetes, diabetic peripheral neuropathy, seizure disorder who comes to the ER due to abdominal pain since this morning, shortly after he woke up, associated with abdominal distention, nausea and 2 episodes of emesis, one at home and one in the emergency department this afternoon per patient. He denies fever, chills, diarrhea, constipation, melena or hematochezia.   Per surgery note, pt refusing surgery and PICC placement. Pt to decide nutrition plan tomorrow. Needs provided below if needed.  Labs reviewed: CBGs: 89-113 Mg/Phos WNL  Diet Order:  Diet NPO time specified  Skin:  Wound (see comment) (L foot and toe DM ulcers)  Last BM:  12/31  Height:   Ht Readings from Last 1 Encounters:  11/21/15 6\' 2"  (1.88 m)    Weight:   Wt Readings from Last 1 Encounters:  11/21/15 141 lb 8.6 oz (64.2 kg)    Ideal Body Weight:  86.36 kg (kg)  BMI:  Body mass index is 18.16 kg/(m^2).  Estimated Nutritional Needs:   Kcal:  1600-1800  Protein:  65-80 grams  Fluid:  >/= 2 L/day  EDUCATION NEEDS:   No education needs identified at this time  Clayton Bibles, MS, RD, LDN Pager: [phone] After Hours Pager: [phone]

## 2015-11-27 NOTE — Progress Notes (Signed)
Patient received daily italian ice at 06:00am.

## 2015-11-27 NOTE — Progress Notes (Signed)
PROGRESS NOTE  [name] [name] [id number] DOB: 05/12/1951 DOA: 11/21/2015 PCP: Marijean Bravo, MD  HPI/Recap of past 24 hours:  On NG suction, no bm, no flatus, still c/o ab pain, reluctant to have surgery, thinking about picc line for nutrition. A friend in room.  Assessment/Plan: Principal Problem:   SBO (small bowel obstruction) (HCC) Active Problems:   Tobacco abuse   Type 2 diabetes mellitus (HCC)   CAD (coronary artery disease)   Proteinuria  SBO (small bowel obstruction) ([place]) NGT removed on 12/29, surgery started clears on 12/30, but not able to tolerate with increase ab pain, restarted ng suction,  keep on  IV fluids, keep k>4, mag>2. Pain control, pca pump started on 1/1, patient did not like pca pump , want to change back to dilaudid 2mg  q2hrs prn,  Patient is refusing picc line for nutrition and reluctant to have surgery, Will f/u on general surgery recommendations   Hypomagnesemia: replace mag.  Type 2 diabetes mellitus ([place]), previous diet controlled, not on meds a1c 6.9, last a1c in 06/2015 was 7.4, report not able to tolerate metformin.  CAD (coronary artery disease) with h/o NSTEMI om 2014, s/p DES to RCA Denies chest pain, has been refusing to take asa or plavix in the past. Closed followed by cardiology.  HLD: not interested in taking any meds.  PVD/ Nonhealing left dorsal foot ulcer and left great toe ulcer, s/p left femoral to below knee popliteal bypass on 07/28/2015, closed followed by vascular surgery Dr. Donnetta Hutching , general surgery Dr Mariea Clonts (left great toe debrided on 11/14/15 by dr Mariea Clonts) ,wound care.   H/o sinus bradycardia, asymptomatic, avoid nodal blocking agent.  H/o seizure, reported dilantin was topped a month ago, and he was started on neurontin, last seizure two years ago.  Proteinuria Consider starting low-dose ACE inhibitor. Patient to follow up with nephrology as an outpatient.  Chronic pain, need adjust pain meds  Tobacco  abuse Smoking cessation education provided Nicotine replacement therapy ordered.  Code Status: Full code. DVT Prophylaxis: SCDs. Family Communication: patient Disposition Plan: not ready to be discharge, eventually home with home health RN when medically stable    Consultants:  General surgery  Procedures:  Ng suction from admission, removed on 12/29, reinserted on 12/30  Antibiotics:  none   Objective: BP 135/83 mmHg  Pulse 61  Temp(Src) 98.4 F (36.9 C) (Oral)  Resp 20  Ht 6\' 2"  (1.88 m)  Wt 141 lb 8.6 oz (64.2 kg)  BMI 18.16 kg/m2  SpO2 98%  Intake/Output Summary (Last 24 hours) at 11/27/15 1053 Last data filed at 11/27/15 0845  Gross per 24 hour  Intake 3506.25 ml  Output   1100 ml  Net 2406.25 ml   Filed Weights   11/21/15 2101  Weight: 141 lb 8.6 oz (64.2 kg)    Exam:   General:  NAD, on ng suction  Cardiovascular: RRR  Respiratory: CTABL  Abdomen: on 1/1 patient hadd localized distension and tender left lower quadrant with associated high pitched BS, today  generalized pain and generalized distension,  mild line well healed surgical scar ( per patient from prior abdominal aorta surgery)  Musculoskeletal: left dorsal foot ulcer and left great toe ulcer (chronic, followed by vascular surgery Dr .Donnetta Hutching)  Neuro: [email or web address]  Data Reviewed: Basic Metabolic Panel:  Recent Labs Lab 11/22/15 [id number] 11/23/15 0534 11/24/15 0445 11/25/15 0115 11/26/15 0545 11/27/15 0555  NA 138 142 143 139 141 143  K 4.1 4.4 4.1 4.0 4.0 4.1  CL 99* 104 109 105 105 106  CO2 29 28 28 26 27 27   GLUCOSE 125* 87 121* 77 126* 123*  BUN 15 19 15 9 10 12   CREATININE [id number] 0.69 0.57* 0.49* 0.55* 0.62  CALCIUM 9.3 9.2 9.2 8.7* 8.8* 9.4  MG 1.7 2.0 1.8 1.6* 1.7 1.7  PHOS 3.1  --   --   --   --  3.1   Liver Function Tests:  Recent Labs Lab 11/21/15 1621 11/22/15 0452 11/27/15 0555  AST 36 22 29  ALT 32 24 24  ALKPHOS 66 57 57  BILITOT 1.1 0.6 1.4*  PROT 7.7 6.2*  6.5  ALBUMIN 4.7 3.7 3.7    Recent Labs Lab 11/21/15 1621  LIPASE 21   No results for input(s): AMMONIA in the last 168 hours. CBC:  Recent Labs Lab 11/22/15 0452 11/23/15 0534 11/24/15 0445 11/24/15 1844 11/25/15 0500 11/26/15 0545 11/27/15 0555  WBC 5.4 3.7* 2.7* 3.0* 2.9* 3.9* 5.4  NEUTROABS 3.7 1.8  --   --  0.9*  --  3.4  HGB 15.0 13.7 12.0* 12.8* 12.0* 12.2* 12.5*  HCT 45.4 42.6 36.3* 39.1 37.3* 37.6* 39.1  MCV 88.7 90.4 91.9 89.9 89.4 89.5 90.3  PLT 121* 124* PLATELET CLUMPS NOTED ON SMEAR, UNABLE TO ESTIMATE 71* 100* 118* 122*   Cardiac Enzymes:   No results for input(s): CKTOTAL, CKMB, CKMBINDEX, TROPONINI in the last 168 hours. BNP (last 3 results) No results for input(s): BNP in the last 8760 hours.  ProBNP (last 3 results) No results for input(s): PROBNP in the last 8760 hours.  CBG:  Recent Labs Lab 11/26/15 1147 11/26/15 1712 11/26/15 2234 11/27/15 0003 11/27/15 0757  GLUCAP 126* 105* 106* 113* 89    No results found for this or any previous visit (from the past 240 hour(s)).   Studies: Dg Abd 2 Views  11/27/2015  CLINICAL DATA:  Generalized abdominal pain with nausea vomiting. EXAM: ABDOMEN - 2 VIEW COMPARISON:  11/26/2015 FINDINGS: Upright film shows no evidence for intraperitoneal free air. Gas-filled dilated small bowel loops in the upper abdomen persist but are slightly decreased in distention since yesterday's exam. NG tube tip is in the proximal stomach with proximal port of the NG tube in the region of the GE junction. IMPRESSION: Slight interval improvement in the proximal small bowel dilatation. Electronically Signed   By: Misty Stanley M.D.   On: 11/27/2015 10:00   Dg Abd 2 Views  11/26/2015  CLINICAL DATA:  Abdominal pain, distension, and weakness. Small bowel obstruction. EXAM: ABDOMEN - 2 VIEW COMPARISON:  11/26/2015 FINDINGS: Nasogastric tube enters the stomach. Dilated loops of small bowel up to 5.8 cm in diameter, formerly 5.1 cm.  Air-fluid levels are present in loops of bowel, some and differing vertical levels within the same loop. Indistinct airspace opacity in the retro diaphragmatic region on the left. Trace left pleural effusion. No free intraperitoneal gas observed beneath the hemidiaphragms. Left iliac stent. Possible gas density projecting over the pubis, significance uncertain. IMPRESSION: 1. Worsening small bowel obstruction, dilated loops up to 5.8 cm in diameter currently. 2. Airspace opacity in the left lower lobe with trace left pleural effusion. Electronically Signed   By: Van Clines M.D.   On: 11/26/2015 18:11    Scheduled Meds: . bacitracin   Topical Daily  . collagenase  1 application Topical Daily  . HYDROmorphone   Intravenous 6 times per day  . pantoprazole (PROTONIX) IV  40 mg Intravenous Q12H  .  sodium chloride  3 mL Intravenous Q12H    Continuous Infusions: . dextrose 5 % and 0.9 % NaCl with KCl 20 mEq/L 75 mL/hr at 11/26/15 1224     Time spent: 66mins, more then 50% spent on discuss the case with patient,  Patient has been refusing picc line, refusing surgery, he repeated ask to eat something, explained to him eating only make situation worse, he needs alternative nutrition. Patient does not seem to have good insight in the disease process, agitated at times  Rondo Spittler MD, PhD  [name] Pager [phone]. If 7PM-7AM, please contact night-coverage at [email or web address], password [facility] 11/27/2015, 10:53 AM  LOS: 6 days

## 2015-11-28 LAB — COMPREHENSIVE METABOLIC PANEL
ALBUMIN: 3.2 g/dL — AB (ref 3.5–5.0)
ALT: 20 U/L (ref 17–63)
ANION GAP: 6 (ref 5–15)
AST: 21 U/L (ref 15–41)
Alkaline Phosphatase: 55 U/L (ref 38–126)
BUN: 12 mg/dL (ref 6–20)
CHLORIDE: 108 mmol/L (ref 101–111)
CO2: 27 mmol/L (ref 22–32)
Calcium: 9 mg/dL (ref 8.9–10.3)
Creatinine, Ser: 0.49 mg/dL — ABNORMAL LOW (ref 0.61–1.24)
GFR calc Af Amer: >60 mL/min (ref >60)
GFR calc non Af Amer: >60 mL/min (ref >60)
GLUCOSE: 138 mg/dL — AB (ref 65–99)
POTASSIUM: 4 mmol/L (ref 3.5–5.1)
SODIUM: 141 mmol/L (ref 135–145)
Total Bilirubin: 1 mg/dL (ref 0.3–1.2)
Total Protein: 5.9 g/dL — ABNORMAL LOW (ref 6.5–8.1)

## 2015-11-28 LAB — CBC
HCT: 37.2 % — ABNORMAL LOW (ref 39.0–52.0)
HEMOGLOBIN: 12 g/dL — AB (ref 13.0–17.0)
MCH: 29.7 pg (ref 26.0–34.0)
MCHC: 32.3 g/dL (ref 30.0–36.0)
MCV: 92.1 fL (ref 78.0–100.0)
PLATELETS: 149 10*3/uL — AB (ref 150–400)
RBC: 4.04 MIL/uL — ABNORMAL LOW (ref 4.22–5.81)
RDW: 14.7 % (ref 11.5–15.5)
WBC: 4.8 10*3/uL (ref 4.0–10.5)

## 2015-11-28 LAB — GLUCOSE, CAPILLARY
GLUCOSE-CAPILLARY: 115 mg/dL — AB (ref 65–99)
GLUCOSE-CAPILLARY: 121 mg/dL — AB (ref 65–99)
GLUCOSE-CAPILLARY: 125 mg/dL — AB (ref 65–99)
Glucose-Capillary: 106 mg/dL — ABNORMAL HIGH (ref 65–99)
Glucose-Capillary: 123 mg/dL — ABNORMAL HIGH (ref 65–99)

## 2015-11-28 LAB — LACTIC ACID, PLASMA: Lactic Acid, Venous: 0.7 mmol/L (ref 0.5–2.0)

## 2015-11-28 MED ORDER — MAGNESIUM SULFATE IN D5W 10-5 MG/ML-% IV SOLN
1.0000 g | Freq: Once | INTRAVENOUS | Status: DC
  Filled 2015-11-28: qty 100

## 2015-11-28 MED ORDER — M.V.I. ADULT IV INJ
INJECTION | INTRAVENOUS | Status: AC
  Administered 2015-11-28: 22:00:00 via INTRAVENOUS
  Filled 2015-11-28: qty 660

## 2015-11-28 MED ORDER — SODIUM CHLORIDE 0.9 % IJ SOLN
10.0000 mL | INTRAMUSCULAR | Status: DC | PRN
Start: 2015-11-28 — End: 2015-12-30
  Administered 2015-11-28 – 2015-12-27 (×7): 10 mL
  Filled 2015-11-28 (×7): qty 40

## 2015-11-28 MED ORDER — FAT EMULSION 20 % IV EMUL: 220.0000 mL | INTRAVENOUS | Status: DC

## 2015-11-28 MED ORDER — BISACODYL 10 MG RE SUPP: 10.0000 mg | Freq: Every day | RECTAL | Status: DC | PRN

## 2015-11-28 MED ORDER — TRACE MINERALS CR-CU-MN-SE-ZN 10-1000-500-60 MCG/ML IV SOLN: INTRAVENOUS | Status: DC

## 2015-11-28 MED ORDER — HYDROMORPHONE HCL 1 MG/ML IJ SOLN
1.0000 mg | INTRAMUSCULAR | Status: DC | PRN
  Administered 2015-11-28 – 2015-11-29 (×8): 1 mg via INTRAVENOUS
  Filled 2015-11-28 (×9): qty 1

## 2015-11-28 MED ORDER — FAT EMULSION 20 % IV EMUL
220.0000 mL | INTRAVENOUS | Status: AC
  Administered 2015-11-28: 220 mL via INTRAVENOUS
  Filled 2015-11-28: qty 220

## 2015-11-28 MED ORDER — ENOXAPARIN SODIUM 40 MG/0.4ML ~~LOC~~ SOLN
40.0000 mg | SUBCUTANEOUS | Status: DC
  Administered 2015-11-29 – 2015-11-30 (×2): 40 mg via SUBCUTANEOUS
  Filled 2015-11-28 (×3): qty 0.4

## 2015-11-28 MED ORDER — INSULIN ASPART 100 UNIT/ML ~~LOC~~ SOLN
0.0000 [IU] | Freq: Four times a day (QID) | SUBCUTANEOUS | Status: DC
  Administered 2015-11-29 (×2): 1 [IU] via SUBCUTANEOUS
  Administered 2015-11-29 – 2015-11-30 (×2): 2 [IU] via SUBCUTANEOUS

## 2015-11-28 MED ORDER — KCL IN DEXTROSE-NACL 20-5-0.9 MEQ/L-%-% IV SOLN
INTRAVENOUS | Status: AC
  Filled 2015-11-28: qty 1000

## 2015-11-28 NOTE — Progress Notes (Signed)
Subjective: No real change, he has agreed to having PICC and TNA.  I will switch him to xeroform gauze over the open wounds Left foot.  He has a good PT pulse.  Objective: Vital signs in last 24 hours: Temp:  [97.9 F (36.6 C)-98.9 F (37.2 C)] 98.2 F (36.8 C) (01/03 0628) Pulse Rate:  [53-62] 62 (01/03 0628) Resp:  [16-19] 16 (01/03 0628) BP: (135-147)/(72-80) 135/72 mmHg (01/03 0628) SpO2:  [96 %-98 %] 97 % (01/03 0628) Last BM Date: 11/25/15 Afebrile, VSS Labs OK Prealbumin 3.7 12-17-15 DG abd yesterday showed minimal improvement   Intake/Output from previous day: 12-16-22 0701 - 01/03 0700 In: 2015 [P.O.:240; I.V.:1725; IV Piggyback:50] Out: 1200 [Urine:200; Emesis/NG output:1000] Intake/Output this shift:    General appearance: alert, cooperative and no distress Resp: clear to auscultation bilaterally GI: soft, distended, no BS, no flatus, he said he had BM a couple days agol Extremities: Left foot open ulcer on great toe and upper portion of foot.  It is fairly clean.  Dressed with [employer].    Lab Results:   Recent Labs  12/17/15 0555 11/28/15 0557  WBC 5.4 4.8  HGB 12.5* 12.0*  HCT 39.1 37.2*  PLT 122* 149*    BMET  Recent Labs  December 17, 2015 0555 11/28/15 0557  NA 143 141  K 4.1 4.0  CL 106 108  CO2 27 27  GLUCOSE 123* 138*  BUN 12 12  CREATININE 0.62 0.49*  CALCIUM 9.4 9.0   PT/INR No results for input(s): LABPROT, INR in the last 72 hours.   Recent Labs Lab 11/21/15 1621 11/22/15 0452 2015-12-17 0555 11/28/15 0557  AST 36 22 29 21   ALT 32 24 24 20   ALKPHOS 66 57 57 55  BILITOT 1.1 0.6 1.4* 1.0  PROT 7.7 6.2* 6.5 5.9*  ALBUMIN 4.7 3.7 3.7 3.2*     Lipase     Component Value Date/Time   LIPASE 21 11/21/2015 1621     Studies/Results: Dg Abd 2 Views  2015/12/17  CLINICAL DATA:  Generalized abdominal pain with nausea vomiting. EXAM: ABDOMEN - 2 VIEW COMPARISON:  11/26/2015 FINDINGS: Upright film shows no evidence for intraperitoneal  free air. Gas-filled dilated small bowel loops in the upper abdomen persist but are slightly decreased in distention since yesterday's exam. NG tube tip is in the proximal stomach with proximal port of the NG tube in the region of the GE junction. IMPRESSION: Slight interval improvement in the proximal small bowel dilatation. Electronically Signed   By: Misty Stanley M.D.   On: 12/17/15 10:00   Dg Abd 2 Views  11/26/2015  CLINICAL DATA:  Abdominal pain, distension, and weakness. Small bowel obstruction. EXAM: ABDOMEN - 2 VIEW COMPARISON:  11/26/2015 FINDINGS: Nasogastric tube enters the stomach. Dilated loops of small bowel up to 5.8 cm in diameter, formerly 5.1 cm. Air-fluid levels are present in loops of bowel, some and differing vertical levels within the same loop. Indistinct airspace opacity in the retro diaphragmatic region on the left. Trace left pleural effusion. No free intraperitoneal gas observed beneath the hemidiaphragms. Left iliac stent. Possible gas density projecting over the pubis, significance uncertain. IMPRESSION: 1. Worsening small bowel obstruction, dilated loops up to 5.8 cm in diameter currently. 2. Airspace opacity in the left lower lobe with trace left pleural effusion. Electronically Signed   By: Van Clines M.D.   On: 11/26/2015 18:11    Medications: . bacitracin   Topical Daily  . collagenase  1 application Topical Daily  .  pantoprazole (PROTONIX) IV  40 mg Intravenous Q12H  . 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 CAD s/p NSTEMI 05/2013 WITH stendts to RCA Ischemic cardiomyopathy  S/p iliac artery stent AODM Diabetic neuropathy Arthritis  Tobacco use Antibiotics:  None DVT: SCD  Plan:  Continue NG, he is taking allot of ice chips, ambulate in halls, PICC/TNA, dressing change to foot BID. Add Lovenox, suppository daily.       LOS: 7 days    [name],[name] 11/28/2015

## 2015-11-28 NOTE — Progress Notes (Addendum)
Patient already had 2 cups of Ice chips since 7 pm. If RN tried to explain about the risk he stated he dont care and cursed. And at 2230, RN caught pt drinking 1 cup of orange juice that was given by the Visitor. When patient knew that MD changed the dilaudid to every 3 hours and lower the dose. Pt gets upset and asking for the Charge nurse and [facility]. Charge nurse and [facility] talk to the patient and tried to explain about the medication and diet orders but patient still does not understand it. Will continue to monitor and give his medicine PRN.

## 2015-11-28 NOTE — Progress Notes (Signed)
PROGRESS NOTE  [name] [name] [id number] DOB: 1951/07/17 DOA: 11/21/2015 PCP: Marijean Bravo, MD  HPI/Recap of past 24 hours:  no bm, no flatus, persistent sbo, on ng suction, patient has finally agreed to picc line insertion  Currently not in pain, talking and joking with a visitor,reluctant to have surgery,    Assessment/Plan: Principal Problem:   SBO (small bowel obstruction) ([place]) Active Problems:   Tobacco abuse   Type 2 diabetes mellitus ([place])   CAD (coronary artery disease)   Proteinuria  SBO (small bowel obstruction) ([place]) NGT removed on 12/29, surgery started clears on 12/30, but not able to tolerate with increase ab pain, restarted ng suction,  keep on  IV fluids, keep k>4, mag>2. Pain control, pca pump started on 1/1, patient did not like pca pump , want to change back to dilaudid 2mg  q2hrs prn,  1/3: today patient does not seem in patient, talking and joking around, I have explained to the patient to limit amount of dilaudid if possible due to concerning for side effect of decrease gi motility which will not help sbo, he states " i do not trust you anymore, i want a different doctor" dilaudid changed to 1mg  q3hrs prn.  reluctant to have surgery,  has been refusing picc line , today has finally agreed to picc line for nutrition. Will f/u on general surgery recommendations   Hypomagnesemia: replace mag.  Type 2 diabetes mellitus ([place]), previous diet controlled, not on meds a1c 6.9, last a1c in 06/2015 was 7.4, report not able to tolerate metformin.  CAD (coronary artery disease) with h/o NSTEMI om 2014, s/p DES to RCA Denies chest pain, has been refusing to take asa or plavix in the past. Closed followed by cardiology.  HLD: not interested in taking any meds.  PVD/ Nonhealing left dorsal foot ulcer and left great toe ulcer, s/p left femoral to below knee popliteal bypass on 07/28/2015, closed followed by vascular surgery Dr. Donnetta Hutching , general surgery Dr Mariea Clonts  (left great toe debrided on 11/14/15 by dr Mariea Clonts) ,wound care.   H/o sinus bradycardia, asymptomatic, avoid nodal blocking agent.  H/o seizure, reported dilantin was topped a month ago, and he was started on neurontin, last seizure two years ago.  Proteinuria Consider starting low-dose ACE inhibitor. Patient to follow up with nephrology as an outpatient.  Chronic pain, need adjust pain meds  Tobacco abuse Smoking cessation education provided Nicotine replacement therapy ordered.  Code Status: Full code. DVT Prophylaxis: SCDs. Family Communication: patient Disposition Plan: not ready to be discharge, eventually home with home health RN when medically stable    Consultants:  General surgery  Procedures:  Ng suction from admission, removed on 12/29, reinserted on 12/30  Antibiotics:  none   Objective: BP 135/72 mmHg  Pulse 62  Temp(Src) 98.2 F (36.8 C) (Oral)  Resp 16  Ht 6\' 2"  (1.88 m)  Wt 141 lb 8.6 oz (64.2 kg)  BMI 18.16 kg/m2  SpO2 97%  Intake/Output Summary (Last 24 hours) at 11/28/15 1102 Last data filed at 11/28/15 0600  Gross per 24 hour  Intake   2015 ml  Output    900 ml  Net   1115 ml   Filed Weights   11/21/15 2101  Weight: 141 lb 8.6 oz (64.2 kg)    Exam:   General:  NAD, on ng suction  Cardiovascular: RRR  Respiratory: CTABL  Abdomen: on 1/1 patient hadd localized distension and tender left lower quadrant with associated high pitched  BS, today  Soft, generalized pain and generalized distension,  mild line well healed surgical scar ( per patient from prior abdominal aorta surgery)  Musculoskeletal: left dorsal foot ulcer and left great toe ulcer (chronic, followed by vascular surgery Dr .Donnetta Hutching)  Neuro: aaox3  Data Reviewed: Basic Metabolic Panel:  Recent Labs Lab 11/22/15 [id number] 11/23/15 0534 11/24/15 0445 11/25/15 0115 11/26/15 0545 11/27/15 0555 11/28/15 0557  NA 138 142 143 139 141 143 141  K 4.1 4.4 4.1 4.0 4.0 4.1  4.0  CL 99* 104 109 105 105 106 108  CO2 29 28 28 26 27 27 27   GLUCOSE 125* 87 121* 77 126* 123* 138*  BUN 15 19 15 9 10 12 12   CREATININE 0.63 0.69 0.57* 0.49* 0.55* 0.62 0.49*  CALCIUM 9.3 9.2 9.2 8.7* 8.8* 9.4 9.0  MG 1.7 2.0 1.8 1.6* 1.7 1.7  --   PHOS 3.1  --   --   --   --  3.1  --    Liver Function Tests:  Recent Labs Lab 11/21/15 1621 11/22/15 0452 11/27/15 0555 11/28/15 0557  AST 36 22 29 21   ALT 32 24 24 20   ALKPHOS 66 57 57 55  BILITOT 1.1 0.6 1.4* 1.0  PROT 7.7 6.2* 6.5 5.9*  ALBUMIN 4.7 3.7 3.7 3.2*    Recent Labs Lab 11/21/15 1621  LIPASE 21   No results for input(s): AMMONIA in the last 168 hours. CBC:  Recent Labs Lab 11/22/15 0452 11/23/15 0534  11/24/15 1844 11/25/15 0500 11/26/15 0545 11/27/15 0555 11/28/15 0557  WBC 5.4 3.7*  < > 3.0* 2.9* 3.9* 5.4 4.8  NEUTROABS 3.7 1.8  --   --  0.9*  --  3.4  --   HGB 15.0 13.7  < > 12.8* 12.0* 12.2* 12.5* 12.0*  HCT 45.4 42.6  < > 39.1 37.3* 37.6* 39.1 37.2*  MCV 88.7 90.4  < > 89.9 89.4 89.5 90.3 92.1  PLT 121* 124*  < > 71* 100* 118* 122* 149*  < > = values in this interval not displayed. Cardiac Enzymes:   No results for input(s): CKTOTAL, CKMB, CKMBINDEX, TROPONINI in the last 168 hours. BNP (last 3 results) No results for input(s): BNP in the last 8760 hours.  ProBNP (last 3 results) No results for input(s): PROBNP in the last 8760 hours.  CBG:  Recent Labs Lab 11/27/15 0757 11/27/15 1210 11/27/15 1756 11/28/15 0032 11/28/15 0626  GLUCAP 89 108* 101* 123* 125*    No results found for this or any previous visit (from the past 240 hour(s)).   Studies: No results found.  Scheduled Meds: . bacitracin   Topical Daily  . collagenase  1 application Topical Daily  . pantoprazole (PROTONIX) IV  40 mg Intravenous Q12H  . sodium chloride  3 mL Intravenous Q12H    Continuous Infusions: . dextrose 5 % and 0.9 % NaCl with KCl 20 mEq/L 75 mL/hr at 11/28/15 1057     Time spent:  10mins, more then 50% spent on discuss the case with patient,  Patient has been refusing picc line, refusing surgery, he repeated ask to eat something, explained to him eating only make situation worse, he needs alternative nutrition. Patient does not seem to have good insight in the disease process, agitated at times. By the end of the day on 1/3, he finally agreed to picc line,but still ingest  lots of ice chips and water.  Almarosa Bohac MD, PhD  [name] Pager [phone]. If 7PM-7AM,  please contact night-coverage at [email or web address], password [facility] 11/28/2015, 11:02 AM  LOS: 7 days

## 2015-11-28 NOTE — Progress Notes (Addendum)
PARENTERAL NUTRITION CONSULT NOTE - INITIAL  Pharmacy Consult for TPN Indication: Bowel obstruction  Allergies  Allergen Reactions  . Zetia [Ezetimibe] Anaphylaxis and Swelling    Tongue and throat   . Atorvastatin Other (See Comments)    Malaise & muscle weakness  . Penicillins Other (See Comments)    Unknown  . Pravastatin Sodium     Pravastatin 40 mg qday and 40 mg q M/W/F caused muscle aches    Patient Measurements: Height: 6\' 2"  (188 cm) Weight: 141 lb 8.6 oz (64.2 kg) IBW/kg (Calculated) : 82.2 Usual Weight: 137-140 lbs  Vital Signs: Temp: 98.2 F (36.8 C) (01/03 0628) Temp Source: Oral (01/03 0628) BP: 135/72 mmHg (01/03 0628) Pulse Rate: [name] (01/03 0628) Intake/Output from previous day: 01/02 0701 - 01/03 0700 In: 2015 [P.O.:240; I.V.:1725; IV Piggyback:50] Out: 1200 [Urine:200; Emesis/NG output:1000] Intake/Output from this shift:    Labs:  Recent Labs  11/26/15 0545 11/27/15 0555 11/28/15 0557  WBC 3.9* 5.4 4.8  HGB 12.2* 12.5* 12.0*  HCT 37.6* 39.1 37.2*  PLT 118* 122* 149*     Recent Labs  11/26/15 0545 11/27/15 0555 11/28/15 0557  NA 141 143 141  K 4.0 4.1 4.0  CL 105 106 108  CO2 27 27 27   GLUCOSE 126* 123* 138*  BUN 10 12 12   CREATININE 0.55* 0.[name] 0.49*  CALCIUM 8.8* 9.4 9.0  MG 1.7 1.7  --   PHOS  --  3.1  --   PROT  --  6.5 5.9*  ALBUMIN  --  3.7 3.2*  AST  --  29 21  ALT  --  24 20  ALKPHOS  --  57 55  BILITOT  --  1.4* 1.0  PREALBUMIN  --  3.7*  --   TRIG  --  124  --    Estimated Creatinine Clearance: 85.8 mL/min (by C-G formula based on Cr of 0.49).    Recent Labs  11/27/15 1756 11/28/15 0032 11/28/15 0626  GLUCAP 101* 123* 125*    Medical History: Past Medical History  Diagnosis Date  . Hyperlipidemia   . Hypertension   . Leg pain   . Peripheral vascular disease ([place])     s/p prior Ao-Bifem BPG  . 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 Coronary  artery disease     a. NSTEMI 7/14=> LHC 06/07/13: Proximal LAD 30%, ostial D1 30%, AV circumflex 80%, then occluded, ostial OM2 occluded, left to left collaterals, mid RCA 95%, inferior HK, EF 60%. PCI: Promus DES to the mid RCA.  No intervention was recommended for the CTO circumflex.  . Ischemic cardiomyopathy     a. Echocardiogram 06/06/13: EF 35-40%, inferior AK, mild MR.  Marland Kitchen Shortness of breath     "can come on at anytime lately; before that it was just w/exertion" (11/10/2013)  . Type II diabetes mellitus ([place])   . [place] mal seizure [facility])     "last one was ~ 3 yr ago; controlled w/daily RX" (11/10/2013)  . Arthritis     "neck, back, legs" (11/10/2013)  . Kidney stones     "when I was younger; passed w/o treatment" (11/10/2013)  . Myocardial infarction ([place])   . Diabetic neuropathy ([place])   . H. pylori infection   . Wears glasses     Medications:  Infusions:  . dextrose 5 % and 0.9 % NaCl with KCl 20 mEq/L 75 mL/hr at 11/28/15  1057    Insulin Requirements in the past 24 hours: None Hx DM  Current Nutrition: NPO  IVF: D5NS KCl 20 meq/L @ 75 ml/hr  Central access: PICC ordered 1/1 (pt refused) TPN start date: pending PICC line placement  ASSESSMENT                                                                                                          HPI: [name] [name] presented to ED on 12/27 with abdominal pain.  PMH includes HTN, HLD, PVD - s/p fem bypass, CAD s/p NSTEMI 2014, cardiomyopathy, DM, diabetic peripheral neuropathy, seizures, and chronic pain on chronic opioids.  Surgical history significant for open AAA repair.  CT shows partial SBO, likely adhesions.  Pt was made NPO and NGT inserted.  Today, 1/1, pt has not made much progress, no surgical plans.  Pharmacy is consulted to start TPN when PICC line can be placed.  Significant events:  1/1 TPN per pharmacy ordered - unable to start without central access.    Today:   Glucose - at goal < 150 (Hx DM on glipizide  PTA)  Electrolytes - Mag 1.7 on 1/2 (repleted with 2gm on 1/2); all other lytes wnl  Renal - SCr wnl  LFTs - AST/ALT and Tbili wnl  TGs - 110 (12/30)  Prealbumin - 3.7 (1/2)  NUTRITIONAL GOALS                                                                                             RD recs: 1600-1800 Kcal/day, 65-80 g protein/day, > 2 L fluid/day  Clinimix E 5/15 at a goal rate of 65 ml/hr + 20% fat emulsion at 10 ml/hr to provide: 78 g/day protein, 1588 Kcal/day.  PLAN                                                                                                                          Patient agreed to PICC placement later today  If able to place PICC today, will start TPN at 1800:   Clinimix E 5/15 at 30 ml/hr + 20% fat emulsion at 10 ml/hr; Reduce IV fluid to 35 ml/hr  at initiation of TPN  Start Sensitive SSI q6h if TPN is started  TPN lab panels on Mondays & Thursdays   F/u daily.  Dia Sitter, PharmD, BCPS 11/28/2015 12:06 PM  [place]: will start lovenox 40 mg SQ q24h for VTE.  Pharmacy will sign off for lovenox. Dia Sitter, PharmD, BCPS 11/28/2015 12:36 PM'

## 2015-11-28 NOTE — Progress Notes (Signed)
Peripherally Inserted Central Catheter/Midline Placement  The IV Nurse has discussed with the patient and/or persons authorized to consent for the patient, the purpose of this procedure and the potential benefits and risks involved with this procedure.  The benefits include less needle sticks, lab draws from the catheter and patient may be discharged home with the catheter.  Risks include, but not limited to, infection, bleeding, blood clot (thrombus formation), and puncture of an artery; nerve damage and irregular heat beat.  Alternatives to this procedure were also discussed.  PICC/Midline Placement Documentation        Kamarrion Stfort, Nicolette Bang 11/28/2015, 7:00 PM

## 2015-11-28 NOTE — Progress Notes (Signed)
Patient ID: [name] [name], male   DOB: 1951-09-26, 65 y.o.   MRN: [id number] [facility] Surgery Progress Note:   * No surgery found *  Subjective: Mental status is alert; cooperative this morning.  Agreed to have PIC line placed Objective: Vital signs in last 24 hours: Temp:  [97.9 F (36.6 C)-98.2 F (36.8 C)] 98.2 F (36.8 C) (01/03 0628) Pulse Rate:  [53-62] 62 (01/03 0628) Resp:  [16-18] 16 (01/03 0628) BP: (135-147)/(72-80) 135/72 mmHg (01/03 0628) SpO2:  [96 %-97 %] 97 % (01/03 0628)  Intake/Output from previous day: 01/02 0701 - 01/03 0700 In: 2015 [P.O.:240; I.V.:1725; IV Piggyback:50] Out: 1200 [Urine:200; Emesis/NG output:1000] Intake/Output this shift:    Physical Exam: Work of breathing is not labored.  No flatus yet but not in any abdominal pain at present  Lab Results:  Results for orders placed or performed during the hospital encounter of 11/21/15 (from the past 48 hour(s))  Glucose, capillary     Status: Abnormal   Collection Time: 11/26/15  5:12 PM  Result Value Ref Range   Glucose-Capillary 105 (H) 65 - 99 mg/dL  Glucose, capillary     Status: Abnormal   Collection Time: 11/26/15 10:34 PM  Result Value Ref Range   Glucose-Capillary 106 (H) 65 - 99 mg/dL  Glucose, capillary     Status: Abnormal   Collection Time: 11/27/15 12:03 AM  Result Value Ref Range   Glucose-Capillary 113 (H) 65 - 99 mg/dL  Comprehensive metabolic panel     Status: Abnormal   Collection Time: 11/27/15  5:55 AM  Result Value Ref Range   Sodium 143 135 - 145 mmol/L   Potassium 4.1 3.5 - 5.1 mmol/L   Chloride 106 101 - 111 mmol/L   CO2 27 22 - 32 mmol/L   Glucose, Bld 123 (H) 65 - 99 mg/dL   BUN 12 6 - 20 mg/dL   Creatinine, Ser 0.62 0.61 - 1.24 mg/dL   Calcium 9.4 8.9 - 10.3 mg/dL   Total Protein 6.5 6.5 - 8.1 g/dL   Albumin 3.7 3.5 - 5.0 g/dL   AST 29 15 - 41 U/L   ALT 24 17 - 63 U/L   Alkaline Phosphatase 57 38 - 126 U/L   Total Bilirubin 1.4 (H) 0.3 - 1.2 mg/dL   GFR calc non Af Amer >60 >60 mL/min   GFR calc Af Amer >60 >60 mL/min    Comment: (NOTE) The eGFR has been calculated using the CKD EPI equation. This calculation has not been validated in all clinical situations. eGFR's persistently <60 mL/min signify possible Chronic Kidney Disease.    Anion gap 10 5 - 15  Magnesium     Status: None   Collection Time: 11/27/15  5:55 AM  Result Value Ref Range   Magnesium 1.7 1.7 - 2.4 mg/dL  Phosphorus     Status: None   Collection Time: 11/27/15  5:55 AM  Result Value Ref Range   Phosphorus 3.1 2.5 - 4.6 mg/dL  CBC     Status: Abnormal   Collection Time: 11/27/15  5:55 AM  Result Value Ref Range   WBC 5.4 4.0 - 10.5 K/uL   RBC 4.33 4.22 - 5.81 MIL/uL   Hemoglobin 12.5 (L) 13.0 - 17.0 g/dL   HCT 39.1 39.0 - 52.0 %   MCV 90.3 78.0 - 100.0 fL   MCH 28.9 26.0 - 34.0 pg   MCHC 32.0 30.0 - 36.0 g/dL   RDW 14.5 11.5 - 15.5 %  Platelets 122 (L) 150 - 400 K/uL  Differential     Status: Abnormal   Collection Time: 11/27/15  5:55 AM  Result Value Ref Range   Neutrophils Relative % 62 %   Neutro Abs 3.4 1.7 - 7.7 K/uL   Lymphocytes Relative 15 %   Lymphs Abs 0.8 0.7 - 4.0 K/uL   Monocytes Relative 21 %   Monocytes Absolute 1.1 (H) 0.1 - 1.0 K/uL   Eosinophils Relative 2 %   Eosinophils Absolute 0.1 0.0 - 0.7 K/uL   Basophils Relative 0 %   Basophils Absolute 0.0 0.0 - 0.1 K/uL  Triglycerides     Status: None   Collection Time: 11/27/15  5:55 AM  Result Value Ref Range   Triglycerides 124 <150 mg/dL    Comment: Performed at [facility]  Prealbumin     Status: Abnormal   Collection Time: 11/27/15  5:55 AM  Result Value Ref Range   Prealbumin 3.7 (L) 18 - 38 mg/dL    Comment: Performed at [facility]  Glucose, capillary     Status: None   Collection Time: 11/27/15  7:57 AM  Result Value Ref Range   Glucose-Capillary 89 65 - 99 mg/dL  Glucose, capillary     Status: Abnormal   Collection Time: 11/27/15 12:10 PM  Result  Value Ref Range   Glucose-Capillary 108 (H) 65 - 99 mg/dL  Glucose, capillary     Status: Abnormal   Collection Time: 11/27/15  5:56 PM  Result Value Ref Range   Glucose-Capillary 101 (H) 65 - 99 mg/dL  Glucose, capillary     Status: Abnormal   Collection Time: 11/28/15 12:32 AM  Result Value Ref Range   Glucose-Capillary 123 (H) 65 - 99 mg/dL  Lactic acid, plasma     Status: None   Collection Time: 11/28/15  5:52 AM  Result Value Ref Range   Lactic Acid, Venous 0.7 0.5 - 2.0 mmol/L  CBC     Status: Abnormal   Collection Time: 11/28/15  5:57 AM  Result Value Ref Range   WBC 4.8 4.0 - 10.5 K/uL   RBC 4.04 (L) 4.22 - 5.81 MIL/uL   Hemoglobin 12.0 (L) 13.0 - 17.0 g/dL   HCT 37.2 (L) 39.0 - 52.0 %   MCV 92.1 78.0 - 100.0 fL   MCH 29.7 26.0 - 34.0 pg   MCHC 32.3 30.0 - 36.0 g/dL   RDW 14.7 11.5 - 15.5 %   Platelets 149 (L) 150 - 400 K/uL  Comprehensive metabolic panel     Status: Abnormal   Collection Time: 11/28/15  5:57 AM  Result Value Ref Range   Sodium 141 135 - 145 mmol/L   Potassium 4.0 3.5 - 5.1 mmol/L   Chloride 108 101 - 111 mmol/L   CO2 27 22 - 32 mmol/L   Glucose, Bld 138 (H) 65 - 99 mg/dL   BUN 12 6 - 20 mg/dL   Creatinine, Ser 0.49 (L) 0.61 - 1.24 mg/dL   Calcium 9.0 8.9 - 10.3 mg/dL   Total Protein 5.9 (L) 6.5 - 8.1 g/dL   Albumin 3.2 (L) 3.5 - 5.0 g/dL   AST 21 15 - 41 U/L   ALT 20 17 - 63 U/L   Alkaline Phosphatase 55 38 - 126 U/L   Total Bilirubin 1.0 0.3 - 1.2 mg/dL   GFR calc non Af Amer >60 >60 mL/min   GFR calc Af Amer >60 >60 mL/min    Comment: (NOTE)  The eGFR has been calculated using the CKD EPI equation. This calculation has not been validated in all clinical situations. eGFR's persistently <60 mL/min signify possible Chronic Kidney Disease.    Anion gap 6 5 - 15  Glucose, capillary     Status: Abnormal   Collection Time: 11/28/15  6:26 AM  Result Value Ref Range   Glucose-Capillary 125 (H) 65 - 99 mg/dL    Radiology/Results: Dg Abd 2  Views  11/27/2015  CLINICAL DATA:  Generalized abdominal pain with nausea vomiting. EXAM: ABDOMEN - 2 VIEW COMPARISON:  11/26/2015 FINDINGS: Upright film shows no evidence for intraperitoneal free air. Gas-filled dilated small bowel loops in the upper abdomen persist but are slightly decreased in distention since yesterday's exam. NG tube tip is in the proximal stomach with proximal port of the NG tube in the region of the GE junction. IMPRESSION: Slight interval improvement in the proximal small bowel dilatation. Electronically Signed   By: Misty Stanley M.D.   On: 11/27/2015 10:00   Dg Abd 2 Views  11/26/2015  CLINICAL DATA:  Abdominal pain, distension, and weakness. Small bowel obstruction. EXAM: ABDOMEN - 2 VIEW COMPARISON:  11/26/2015 FINDINGS: Nasogastric tube enters the stomach. Dilated loops of small bowel up to 5.8 cm in diameter, formerly 5.1 cm. Air-fluid levels are present in loops of bowel, some and differing vertical levels within the same loop. Indistinct airspace opacity in the retro diaphragmatic region on the left. Trace left pleural effusion. No free intraperitoneal gas observed beneath the hemidiaphragms. Left iliac stent. Possible gas density projecting over the pubis, significance uncertain. IMPRESSION: 1. Worsening small bowel obstruction, dilated loops up to 5.8 cm in diameter currently. 2. Airspace opacity in the left lower lobe with trace left pleural effusion. Electronically Signed   By: Van Clines M.D.   On: 11/26/2015 18:11    Anti-infectives: Anti-infectives    None      Assessment/Plan: Problem List: Patient Active Problem List   Diagnosis Date Noted  . Proteinuria 11/22/2015  . SBO (small bowel obstruction) ([place]) 11/21/2015  . Protein-calorie malnutrition, severe ([place]) 07/30/2015  . PAD (peripheral artery disease) ([place]) 07/28/2015  . Chest pain 11/10/2013  . Shortness of breath 11/10/2013  . Shoulder contusion 11/07/2013  . Generalized weakness 10/23/2013   . Atypical chest pain 10/23/2013  . Bradycardia 10/23/2013  . CAD (coronary artery disease) 10/23/2013  . Peripheral vascular disease, unspecified ([place]) 07/27/2013  . Light headedness 07/02/2013  . Anxiety and depression 07/02/2013  . Ecchymosis 06/11/2013  . Dyspnea 06/10/2013  . NSTEMI (non-ST elevated myocardial infarction) ([place]) 06/05/2013  . Tobacco abuse 06/05/2013  . Dyslipidemia 06/05/2013  . Type 2 diabetes mellitus ([place]) 06/05/2013  . Seizure disorder ([place]) 06/05/2013  . Abdominal aneurysm without mention of rupture 12/17/2011  . PVD (peripheral vascular disease) with claudication ([place]) 11/21/2011  . Atherosclerosis of native arteries of the extremities with intermittent claudication 10/09/2011    Bowel obstruction on multiple narcotics and with protein calorie malnutrition.  Begin TNA after pic line placement.  * No surgery found *    LOS: 7 days   Matt B. Hassell Done, MD, [facility] [phone] beeper [phone]  11/28/2015 12:39 PM

## 2015-11-29 ENCOUNTER — Inpatient Hospital Stay (HOSPITAL_COMMUNITY): Payer: Medicaid Other

## 2015-11-29 DIAGNOSIS — E11621 Type 2 diabetes mellitus with foot ulcer: Secondary | ICD-10-CM

## 2015-11-29 DIAGNOSIS — L97529 Non-pressure chronic ulcer of other part of left foot with unspecified severity: Secondary | ICD-10-CM

## 2015-11-29 DIAGNOSIS — I2510 Atherosclerotic heart disease of native coronary artery without angina pectoris: Secondary | ICD-10-CM

## 2015-11-29 LAB — COMPREHENSIVE METABOLIC PANEL
ALBUMIN: 3.3 g/dL — AB (ref 3.5–5.0)
ALK PHOS: 58 U/L (ref 38–126)
ALT: 18 U/L (ref 17–63)
ANION GAP: 11 (ref 5–15)
AST: 20 U/L (ref 15–41)
BILIRUBIN TOTAL: 1.1 mg/dL (ref 0.3–1.2)
BUN: 13 mg/dL (ref 6–20)
CALCIUM: 9.2 mg/dL (ref 8.9–10.3)
CO2: 27 mmol/L (ref 22–32)
Chloride: 106 mmol/L (ref 101–111)
Creatinine, Ser: 0.58 mg/dL — ABNORMAL LOW (ref 0.61–1.24)
GFR calc non Af Amer: >60 mL/min (ref >60)
GLUCOSE: 149 mg/dL — AB (ref 65–99)
POTASSIUM: 3.9 mmol/L (ref 3.5–5.1)
Sodium: 144 mmol/L (ref 135–145)
TOTAL PROTEIN: 5.8 g/dL — AB (ref 6.5–8.1)

## 2015-11-29 LAB — GLUCOSE, CAPILLARY
GLUCOSE-CAPILLARY: 151 mg/dL — AB (ref 65–99)
GLUCOSE-CAPILLARY: 150 mg/dL — AB (ref 65–99)
Glucose-Capillary: 116 mg/dL — ABNORMAL HIGH (ref 65–99)
Glucose-Capillary: 136 mg/dL — ABNORMAL HIGH (ref 65–99)

## 2015-11-29 LAB — CBC
HCT: 36.4 % — ABNORMAL LOW (ref 39.0–52.0)
HEMOGLOBIN: 11.6 g/dL — AB (ref 13.0–17.0)
MCH: 29.3 pg (ref 26.0–34.0)
MCHC: 31.9 g/dL (ref 30.0–36.0)
MCV: 91.9 fL (ref 78.0–100.0)
Platelets: UNDETERMINED 10*3/uL (ref 150–400)
RBC: 3.96 MIL/uL — AB (ref 4.22–5.81)
RDW: 14.8 % (ref 11.5–15.5)
WBC: 6.6 10*3/uL (ref 4.0–10.5)

## 2015-11-29 LAB — DIFFERENTIAL
BASOS ABS: 0 10*3/uL (ref 0.0–0.1)
Basophils Relative: 0 %
EOS PCT: 2 %
Eosinophils Absolute: 0.1 10*3/uL (ref 0.0–0.7)
Lymphocytes Relative: 17 %
Lymphs Abs: 1.1 10*3/uL (ref 0.7–4.0)
MONOS PCT: 14 %
Monocytes Absolute: 0.9 10*3/uL (ref 0.1–1.0)
NEUTROS PCT: 67 %
Neutro Abs: 4.5 10*3/uL (ref 1.7–7.7)

## 2015-11-29 LAB — MAGNESIUM: MAGNESIUM: 1.7 mg/dL (ref 1.7–2.4)

## 2015-11-29 LAB — PHOSPHORUS: PHOSPHORUS: 2.7 mg/dL (ref 2.5–4.6)

## 2015-11-29 MED ORDER — MAGNESIUM SULFATE IN D5W 10-5 MG/ML-% IV SOLN
1.0000 g | Freq: Once | INTRAVENOUS | Status: AC
  Administered 2015-11-29: 1 g via INTRAVENOUS
  Filled 2015-11-29: qty 100

## 2015-11-29 MED ORDER — POTASSIUM PHOSPHATES 15 MMOLE/5ML IV SOLN
10.0000 mmol | Freq: Once | INTRAVENOUS | Status: AC
  Administered 2015-11-29: 10 mmol via INTRAVENOUS
  Filled 2015-11-29: qty 3.33

## 2015-11-29 MED ORDER — LORAZEPAM 2 MG/ML IJ SOLN
0.5000 mg | Freq: Once | INTRAMUSCULAR | Status: AC
  Administered 2015-11-29: 0.5 mg via INTRAVENOUS
  Filled 2015-11-29: qty 1

## 2015-11-29 MED ORDER — TRACE MINERALS CR-CU-MN-SE-ZN 10-1000-500-60 MCG/ML IV SOLN
INTRAVENOUS | Status: AC
  Administered 2015-11-29: 17:00:00 via INTRAVENOUS
  Filled 2015-11-29: qty 1200

## 2015-11-29 MED ORDER — KCL IN DEXTROSE-NACL 20-5-0.9 MEQ/L-%-% IV SOLN
INTRAVENOUS | Status: DC
  Filled 2015-11-29: qty 1000

## 2015-11-29 MED ORDER — FAT EMULSION 20 % IV EMUL
240.0000 mL | INTRAVENOUS | Status: AC
  Administered 2015-11-29: 240 mL via INTRAVENOUS
  Filled 2015-11-29: qty 250

## 2015-11-29 NOTE — Progress Notes (Signed)
  Subjective: NG is back in.  He pulled it out last PM when he was angry over pain med issues.  ? Flatus, he isn't sure.  TNA is going.  I don't hear much in the way of BS, he isn't real distended, and he is soft.    Objective: Vital signs in last 24 hours: Temp:  [98 F (36.7 C)] 98 F (36.7 C) (01/04 0603) Pulse Rate:  [56-59] 56 (01/04 0603) Resp:  [16] 16 (01/04 0603) BP: (145-149)/(79) 149/79 mmHg (01/04 0603) SpO2:  [98 %-99 %] 99 % (01/04 0603) Last BM Date: 11/25/15 1800 from NG yesterday, he got angry and pulled NG last PM Afebrile, VSS Labs OK Intake/Output from previous day: 01/03 0701 - 01/04 0700 In: 997.4 [I.V.:997.4] Out: 2500 [Urine:700; Emesis/NG output:1800] Intake/Output this shift: Total I/O In: -  Out: 225 [Urine:225]  General appearance: alert, cooperative and no distress GI: soft, rare BS, no real flatus, he isn't real tender on exam.  Lab Results:   Recent Labs  11/28/15 0557 11/29/15 0452  WBC 4.8 6.6  HGB 12.0* 11.6*  HCT 37.2* 36.4*  PLT 149* PLATELET CLUMPS NOTED ON SMEAR, UNABLE TO ESTIMATE    BMET  Recent Labs  11/28/15 0557 11/29/15 0452  NA 141 144  K 4.0 3.9  CL 108 106  CO2 27 27  GLUCOSE 138* 149*  BUN 12 13  CREATININE 0.49* 0.58*  CALCIUM 9.0 9.2   PT/INR No results for input(s): LABPROT, INR in the last 72 hours.   Recent Labs Lab 2015-12-27 0555 11/28/15 0557 11/29/15 0452  AST 29 21 20   ALT 24 20 18   ALKPHOS 57 55 58  BILITOT 1.4* 1.0 1.1  PROT 6.5 5.9* 5.8*  ALBUMIN 3.7 3.2* 3.3*     Lipase     Component Value Date/Time   LIPASE 21 11/21/2015 1621     Studies/Results: Dg Abd 2 Views  12-27-2015  CLINICAL DATA:  Generalized abdominal pain with nausea vomiting. EXAM: ABDOMEN - 2 VIEW COMPARISON:  11/26/2015 FINDINGS: Upright film shows no evidence for intraperitoneal free air. Gas-filled dilated small bowel loops in the upper abdomen persist but are slightly decreased in distention since yesterday's  exam. NG tube tip is in the proximal stomach with proximal port of the NG tube in the region of the GE junction. IMPRESSION: Slight interval improvement in the proximal small bowel dilatation. Electronically Signed   By: Misty Stanley M.D.   On: 12/27/2015 10:00    Medications: . bacitracin   Topical Daily  . collagenase  1 application Topical Daily  . enoxaparin (LOVENOX) injection  40 mg Subcutaneous Q24H  . insulin aspart  0-9 Units Subcutaneous 4 times per day  . magnesium sulfate 1 - 4 g bolus IVPB  1 g Intravenous Once  . pantoprazole (PROTONIX) IV  40 mg Intravenous Q12H  . potassium phosphate IVPB (mmol)  10 mmol Intravenous Once  . sodium chloride  3 mL Intravenous Q12H    Assessment/Plan SBO Chronic constipation on pain medications Malnutrition with prealbumin 3.7 on 12/27/15 Hx of AO bifem 11/25/11. CAD s/p NSTEMI 05/2013 WITH stendts to RCA Ischemic cardiomyopathy  S/p iliac artery stent AODM Diabetic neuropathy Arthritis  Tobacco use Antibiotics: None DVT: SCD/Lovenox   Plan:  i will recheck his film, continue NG decompression, and TNA.  I don't think he has improved much since yesterday.    LOS: 8 days    JENNINGS,WILLARD 11/29/2015

## 2015-11-29 NOTE — Progress Notes (Signed)
Patient has reported several occassions of passing gas today.

## 2015-11-29 NOTE — Progress Notes (Signed)
[facility]                                                                              Patient Demographics  [name] [name], is a 65 y.o. male, DOB - [birth year], [id number]  Admit date - 11/21/2015   Admitting Physician Reubin Milan, MD  Outpatient Primary MD for the patient is TALBOT, DAVID Loletha Grayer, MD  LOS - 8   Chief Complaint  Patient presents with  . Abdominal Pain      Interim history 12 old male history of hyperlipidemia, hypertension, CAD, diabetes mellitus, presented to the emergency department with complaints of abdominal pain. Patient admitted for small bowel obstruction noted on CT scan of the abdomen. General surgery currently following.  Assessment & Plan   Abdominal pain secondary to small bowel obstruction -Noted on CT of the abdomen -Gen. surgery consultation appreciated -Patient had NG tube removed 11/23/2015 and was started on clears however was unable to tolerate and began to have increased abdominal pain -NG tube was then replaced. Patient decided to pull out the NG tube on 11/28/2015.  Replaced again. -Patient is very reluctant to have surgery as well as PICC line however finally received PICC line for TPN -Continue pain control (of note patient was on PCA pump on 11/26/2015 however this was discontinued as patient did not feel he was getting any more medication. He was on Dilaudid 2 mg every 2 hours this was changed to 1 mg every 3 hours) -Abdominal x-ray on 11/29/2015 shows persistent mild dilated small bowel loops, multiple air filled levels consistent with small bowel obstruction.  Diabetes mellitus, type II -Hemoglobin A1c 6.9 -Patient unable to take metformin -Continue CBG monitoring and insulin sliding scale  Coronary artery disease -Patient had NSTEMI in 2014 -Currently denies chest pain -Refuse taking aspirin or Plavix in the past  Hyperlipidemia -Not any medications, has no intentions of starting  PVD/nonhealing left foot  ulcer -s/p left femoral to below knee popliteal bypass 07/28/2015, by vascular surgery, Dr. Donnetta Hutching. -Patient also had left great toe debridement on 11/14/2015 by Dr. Mariea Clonts -Continue wound care  History of sinus bradycardia -Currently asymptomatic, avoid AV nodal blocking agents  History of seizures -Dilantin supposedly stopped one month ago -Last seizure activity was 2 years ago  Proteinuria -Patient will need outpatient follow-up with nephrology  Tobacco abuse -Discussed smoking cessation  Code Status: Full  Family Communication: None at bedside.  Disposition Plan: Admitted, continue to monitor SBO   Time Spent in minutes   30  minutes  Procedures  Placement of PICC line Placement of NGT  Consults   Gen. surgery  DVT Prophylaxis  Lovenox  Lab Results  Component Value Date   PLT PLATELET CLUMPS NOTED ON SMEAR, UNABLE TO ESTIMATE 11/29/2015    Medications  Scheduled Meds: . bacitracin   Topical Daily  . collagenase  1 application Topical Daily  . enoxaparin (LOVENOX) injection  40 mg Subcutaneous Q24H  . insulin aspart  0-9 Units Subcutaneous 4 times per day  . pantoprazole (PROTONIX) IV  40 mg Intravenous Q12H  . potassium phosphate IVPB (mmol)  10 mmol Intravenous Once  . sodium chloride  3 mL Intravenous Q12H  Continuous Infusions: . dextrose 5 % and 0.9 % NaCl with KCl 20 mEq/L 35 mL/hr at 11/28/15 2013  . dextrose 5 % and 0.9 % NaCl with KCl 20 mEq/L    . Marland KitchenTPN (CLINIMIX-E) Adult 30 mL/hr at 11/28/15 2154   And  . fat emulsion 220 mL (11/28/15 2153)  . Marland KitchenTPN (CLINIMIX-E) Adult     And  . fat emulsion     PRN Meds:.albuterol, bisacodyl, hydrALAZINE, HYDROmorphone (DILAUDID) injection, ketorolac, phenol, polyethylene glycol, promethazine, sodium chloride, triamcinolone cream  Antibiotics    Anti-infectives    None      Subjective:   [name] [name] seen and examined today.  Patient continues to complain of pain is very tearful and taking about  his left foot. States that before mean to him in the hospital therefore, he pulled out his NG tube overnight. Patient currently denies any chest pain or shortness of breath. States he would like to eat and drink something. Does admit to passing gas.  Objective:   Filed Vitals:   11/27/15 1951 11/28/15 0628 11/28/15 2047 11/29/15 0603  BP: 147/80 135/72 145/79 149/79  Pulse: 61 62 59 56  Temp: 98 F (36.7 C) 98.2 F (36.8 C) 98 F (36.7 C) 98 F (36.7 C)  TempSrc: Oral Oral Oral Oral  Resp: 18 16 16 16   Height:      Weight:      SpO2: 96% 97% 98% 99%    Wt Readings from Last 3 Encounters:  11/21/15 64.2 kg (141 lb 8.6 oz)  11/14/15 62.143 kg (137 lb)  11/06/15 63.413 kg (139 lb 12.8 oz)     Intake/Output Summary (Last 24 hours) at 11/29/15 1322 Last data filed at 11/29/15 1110  Gross per 24 hour  Intake 1390.75 ml  Output   2550 ml  Net -1159.25 ml    Exam  General: Well developed, well nourished, NAD, appears stated age  HEENT: NCAT, mucous membranes moist. NGT in place  Cardiovascular: S1 S2 auscultated, RRR, no murmurs  Respiratory: Clear to auscultation bilaterally   Abdomen: Soft, nontender, nondistended, + bowel sounds, +flatus  Extremities: warm dry without cyanosis clubbing or edema  Neuro: AAOx3,nonfocal   Data Review   Micro Results No results found for this or any previous visit (from the past 240 hour(s)).  Radiology Reports Dg Abd 1 View  11/22/2015  CLINICAL DATA:  Follow-up SBO EXAM: ABDOMEN - 1 VIEW COMPARISON:  11/21/2015 FINDINGS: Enteric tube in the proximal stomach. Moderate dilatation of multiple small bowel loops, compatible with persistent small bowel obstruction. Vascular stents. Residual extra contrast in the bladder. IMPRESSION: Enteric tube in the proximal stomach. Stable small bowel dilatation, consistent with persistent small bowel obstruction. Electronically Signed   By: Julian Hy M.D.   On: 11/22/2015 09:13   Dg Abd 1  View  11/22/2015  CLINICAL DATA:  Status post nasogastric tube placement. Initial encounter. EXAM: ABDOMEN - 1 VIEW COMPARISON:  CT of the abdomen and pelvis performed earlier today at 6:45 p.m. FINDINGS: The patient's enteric tube is seen ending overlying the body of the stomach, with the side port about the gastroesophageal junction. There is dilatation of small bowel loops up to 4.9 cm in maximal diameter, compatible with small bowel obstruction as previously noted. No definite free air is seen within the abdomen, though evaluation is limited on a single supine view. No acute osseous abnormalities are identified. A left common iliac stent is noted. The visualized lung apices are grossly clear. IMPRESSION:  1. Enteric tube seen ending overlying the body of the stomach, with the side port about the gastroesophageal junction. 2. Dilatation of small bowel loops up to 4.9 cm in maximal diameter, compatible with small bowel obstruction as previously noted. 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 There is approximately 360 degrees of twisting at the mesenteric axis. Would correlate clinically. These results were called by telephone at the time of interpretation on 11/22/2015 at 12:04 am to Dr. Marcello Moores, who verbally acknowledged these results. Electronically Signed   By: Garald Balding M.D.   On: 11/22/2015 00:08   Ct Abdomen Pelvis W Contrast  11/21/2015  CLINICAL DATA:  Mid abdominal pain beginning this morning. Distension in right lower quadrant. Withdrawing from oxycodone. Small bowel obstruction. EXAM: CT ABDOMEN AND PELVIS WITH CONTRAST TECHNIQUE: Multidetector CT imaging of the abdomen and pelvis was performed using the standard protocol following  bolus administration of intravenous contrast. CONTRAST:  60mL OMNIPAQUE IOHEXOL 300 MG/ML SOLN, 151mL OMNIPAQUE IOHEXOL 300 MG/ML SOLN COMPARISON:  11/21/2015 plain films.  CT of 10/18/2014. FINDINGS: Lower chest: Bibasilar atelectasis. Normal heart size without pericardial or pleural effusion. Right coronary artery stent. Hepatobiliary: Probable perfusion anomaly in the high right hepatic lobe on image 14/series 2, similar. A too small to characterize lesion in the left hepatic lobe on image 16/series 2. Normal gallbladder, without biliary ductal dilatation. Pancreas: Mild pancreatic atrophy, without duct dilatation or dominant mass. Spleen: Normal in size, without focal abnormality. Adrenals/Urinary Tract: Normal adrenal glands. Bilateral fluid density renal lesions, most consistent with cysts. An inter/lower polar right renal lesion measures 5.3 x 5.8 cm including on image 42/series 2. This has a perceptible peripheral wall, suggesting complexity. Is however, decreased in size since the 2011 exam, favoring a benign etiology. No hydronephrosis. Normal urinary bladder. Stomach/Bowel: Mild to moderate gastric distention. Distal colon is diminutive, likely decompressed. The proximal colon is stool-filled. Appendix is not confidently identified. Terminal ileum is decompressed on image 56 of series 2. Proximal small bowel loops are mildly dilated and fluid-filled, including at 3.3 cm on image 60/series 2. Transition point is identified within the left hemipelvis, involving distal small bowel. Example image 58/series 2. No pneumatosis or free intraperitoneal air. No bowel wall thickening to suggest complicating ischemia. Vascular/Lymphatic: Status post aortic to femoral bypass. Celiac and superior mesenteric artery both patent. 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 No abdominopelvic adenopathy. Reproductive: Normal prostate.  Small bilateral hydroceles. Other: Small volume abdominal  pelvic ascites. Musculoskeletal: Left iliac bone island. Advanced degenerative disc disease at L5-S1. IMPRESSION: 1. Partial small bowel obstruction. Transition point identified within the distal small bowel, likely secondary to adhesions. 2. Although no specific evidence of complicating ischemia is identified, there is new small volume abdominal pelvic ascites, nonspecific. 3. Bilateral hydroceles. Electronically Signed   By: Abigail Miyamoto M.D.   On: 11/21/2015 19:03   Dg Abd 2 Views  11/29/2015  CLINICAL DATA:  Follow-up small bowel obstruction EXAM: ABDOMEN - 2 VIEW COMPARISON:  11/27/2015 FINDINGS: NG tube in place again noted. Persistent mild dilated small bowel loops with multiple air-fluid levels consistent with small bowel obstruction. No free abdominal air. IMPRESSION: Persistent mild dilated small bowel loops and multiple air-fluid levels consistent with small bowel obstruction. NG tube in  place. No free abdominal air. Electronically Signed   By: Lahoma Crocker M.D.   On: 11/29/2015 11:10   Dg Abd 2 Views  11/27/2015  CLINICAL DATA:  Generalized abdominal pain with nausea vomiting. EXAM: ABDOMEN - 2 VIEW COMPARISON:  11/26/2015 FINDINGS: Upright film shows no evidence for intraperitoneal free air. Gas-filled dilated small bowel loops in the upper abdomen persist but are slightly decreased in distention since yesterday's exam. NG tube tip is in the proximal stomach with proximal port of the NG tube in the region of the GE junction. IMPRESSION: Slight interval improvement in the proximal small bowel dilatation. Electronically Signed   By: Misty Stanley M.D.   On: 11/27/2015 10:00   Dg Abd 2 Views  11/26/2015  CLINICAL DATA:  Abdominal pain, distension, and weakness. Small bowel obstruction. EXAM: ABDOMEN - 2 VIEW COMPARISON:  11/26/2015 FINDINGS: Nasogastric tube enters the stomach. Dilated loops of small bowel up to 5.8 cm in diameter, formerly 5.1 cm. Air-fluid levels are present in loops of bowel, some  and differing vertical levels within the same loop. Indistinct airspace opacity in the retro diaphragmatic region on the left. Trace left pleural effusion. No free intraperitoneal gas observed beneath the hemidiaphragms. Left iliac stent. Possible gas density projecting over the pubis, significance uncertain. IMPRESSION: 1. Worsening small bowel obstruction, dilated loops up to 5.8 cm in diameter currently. 2. Airspace opacity in the left lower lobe with trace left pleural effusion. Electronically Signed   By: Van Clines M.D.   On: 11/26/2015 18:11   Dg Abd 2 Views  11/25/2015  CLINICAL DATA:  Small bowel obstruction.  Nausea. EXAM: ABDOMEN - 2 VIEW COMPARISON:  November 24, 2015. FINDINGS: There remains severe small bowel dilatation with air-fluid levels consistent with distal small bowel obstruction. Nasogastric tube tip is seen in expected position of distal esophagus. Vascular calcifications are noted. IMPRESSION: Continued presence of severe small bowel dilatation with air-fluid levels consistent with distal small bowel obstruction. Distal tip of nasogastric tube seen in expected position of distal esophagus. Electronically Signed   By: Marijo Conception, M.D.   On: 11/25/2015 08:47   Dg Abd 2 Views  11/24/2015  CLINICAL DATA:  Patient had surgery this morning, 11/24/2015, for small-bowel obstruction. History of diabetes. EXAM: ABDOMEN - 2 VIEW COMPARISON:  Abdominal plain film from earlier same day. FINDINGS: There is increased distension of the small bowel, now with small bowel involvement throughout the abdomen and upper pelvis. Associated air-fluid levels on the decubitus images. IMPRESSION: Significantly increased distention of the small bowel, and now with small bowel involvement throughout the abdomen and upper pelvis. Dr. Erlinda Hong tells me that patient did NOT have surgery so these findings represent a significant worsening of small bowel obstruction, likely a complete small bowel obstruction.  Immediate surgical consultation recommended. These results were called by telephone at the time of interpretation on 11/24/2015 at 3:35 pm to Dr. Florencia Reasons , who verbally acknowledged these results. Electronically Signed   By: Franki Cabot M.D.   On: 11/24/2015 15:39   Dg Abd 2 Views  11/24/2015  CLINICAL DATA:  Follow-up small bowel obstruction. Initial encounter. EXAM: ABDOMEN - 2 VIEW COMPARISON:  Abdominal radiograph performed 11/22/2015 FINDINGS: The small bowel is dilated to 5.2 cm in maximal diameter, again reflecting small bowel obstruction. There is a decreased amount of stool in the colon. No definite free intra-abdominal air is seen. A vascular stent is noted at the left hemipelvis. No acute osseous abnormalities are identified.  The visualized lung bases are grossly clear. IMPRESSION: Small bowel dilatation to 5.2 cm in maximal diameter, reflecting persistent small-bowel obstruction. No definite free intra-abdominal air seen. Electronically Signed   By: Garald Balding M.D.   On: 11/24/2015 00:38   Dg Abd Acute W/chest  11/21/2015  CLINICAL DATA:  65 year old presenting with severe acute superimposed upon chronic intermittent generalized abdominal pain, associated with nausea and vomiting. EXAM: DG ABDOMEN ACUTE W/ 1V CHEST COMPARISON:  CT abdomen and pelvis 10/18/2014. Chest x-rays 01/17/2015 and earlier. FINDINGS: Multiple dilated loops of small bowel throughout the abdomen and pelvis demonstrating air-fluid levels on the erect image. Air-fluid level in the mildly distended and relatively featureless cecum and proximal ascending colon. Gas throughout otherwise normal caliber colon to the rectum. No evidence of free air on the lateral decubitus image. Native abdominal aortic and left common iliac and external iliac artery stents, though the patient has undergone a prior aortobifemoral bypass graft procedure as noted on the prior CT. Cardiac silhouette mildly enlarged, unchanged allowing for  differences in technique. Lungs emphysematous though clear. No pleural effusions. IMPRESSION: 1. Small bowel obstruction.  No free intraperitoneal air. 2. Possible ascending colitis. 3. Mild cardiomegaly. COPD/emphysema. No acute cardiopulmonary disease. Electronically Signed   By: Evangeline Dakin M.D.   On: 11/21/2015 16:57   Dg Abd Portable 1v-small Bowel Obstruction Protocol-initial, 8 Hr Delay  11/23/2015  CLINICAL DATA:  8 hours following administration of contrast. Assess small-bowel obstruction. Initial encounter. EXAM: PORTABLE ABDOMEN - 1 VIEW COMPARISON:  Abdominal radiograph performed earlier today at 8:49 a.m., and CT of the abdomen and pelvis performed 11/21/2015 FINDINGS: Per clinical notes, contrast was administered 8 hours ago. It is not seen at this time. This may reflect diffuse dilution of contrast within the small bowel. There is persistent dilatation of small-bowel loops to 5.2 cm in maximal diameter, compatible with small bowel obstruction. No definite free intra-abdominal air is seen. Residual stool is seen within the colon. Contrast is seen within the bladder. Mild degenerative change is noted along the lumbar spine. The patient's enteric tube is seen ending at the body of the stomach, with the side port at the distal esophagus. A vascular stent is noted along the left common iliac and external iliac arteries. IMPRESSION: Contrast is not seen at this time. This may reflect diffuse dilution of contrast within the small bowel. Persistent dilatation of small-bowel loops to 5.2 cm in maximal diameter, compatible with small bowel obstruction. No definite free intra-abdominal air seen. 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 Would follow the patient's clinical condition carefully. Electronically Signed   By: Garald Balding M.D.   On: 11/23/2015 01:09   Dg Abd Portable 2v  11/26/2015  CLINICAL DATA:  Small bowel obstruction and abdominal pain.  EXAM: PORTABLE ABDOMEN - 2 VIEW COMPARISON:  11/25/2015 and multiple recent abdominal films as well as CT of the abdomen and pelvis on 11/21/2015. FINDINGS: Since the prior examination the nasogastric tube is been advanced into the stomach. Multiple dilated loops of small bowel containing gas are again identified primarily involving proximal jejunal loops. Degree of small bowel dilatation appears slightly improved. No evidence of free intraperitoneal air. The colon remains decompressed. IMPRESSION: Slightly improved small bowel dilatation. Electronically Signed   By: Aletta Edouard M.D.   On: 11/26/2015 09:37    CBC  Recent Labs Lab 11/23/15 0534  11/25/15 0500 11/26/15 0545 11/27/15 0555 11/28/15 0557 11/29/15 0452  WBC 3.7*  < >  2.9* 3.9* 5.4 4.8 6.6  HGB 13.7  < > 12.0* 12.2* 12.5* 12.0* 11.6*  HCT 42.6  < > 37.3* 37.6* 39.1 37.2* 36.4*  PLT 124*  < > 100* 118* 122* 149* PLATELET CLUMPS NOTED ON SMEAR, UNABLE TO ESTIMATE  MCV 90.4  < > 89.4 89.5 90.3 92.1 91.9  MCH 29.1  < > 28.8 29.0 28.9 29.7 29.3  MCHC 32.2  < > 32.2 32.4 32.0 32.3 31.9  RDW 14.9  < > 14.7 14.5 14.5 14.7 14.8  LYMPHSABS 1.2  --  1.3  --  0.8  --  1.1  MONOABS 0.6  --  0.6  --  1.1*  --  0.9  EOSABS 0.1  --  0.1  --  0.1  --  0.1  BASOSABS 0.0  --  0.0  --  0.0  --  0.0  < > = values in this interval not displayed.  Chemistries   Recent Labs Lab 11/24/15 0445 11/25/15 0115 11/26/15 0545 11/27/15 0555 11/28/15 0557 11/29/15 0452  NA 143 139 141 143 141 144  K 4.1 4.0 4.0 4.1 4.0 3.9  CL 109 105 105 106 108 106  CO2 28 26 27 27 27 27   GLUCOSE 121* 77 126* 123* 138* 149*  BUN 15 9 10 12 12 13   CREATININE 0.57* 0.49* 0.55* 0.62 0.49* 0.58*  CALCIUM 9.2 8.7* 8.8* 9.4 9.0 9.2  MG 1.8 1.6* 1.7 1.7  --  1.7  AST  --   --   --  29 21 20   ALT  --   --   --  24 20 18   ALKPHOS  --   --   --  57 55 58  BILITOT  --   --   --  1.4* 1.0 1.1    ------------------------------------------------------------------------------------------------------------------ estimated creatinine clearance is 85.8 mL/min (by C-G formula based on Cr of 0.58). ------------------------------------------------------------------------------------------------------------------ No results for input(s): HGBA1C in the last 72 hours. ------------------------------------------------------------------------------------------------------------------  Recent Labs  11/27/15 0555  TRIG 124   ------------------------------------------------------------------------------------------------------------------ No results for input(s): TSH, T4TOTAL, T3FREE, THYROIDAB in the last 72 hours.  Invalid input(s): FREET3 ------------------------------------------------------------------------------------------------------------------ No results for input(s): VITAMINB12, FOLATE, FERRITIN, TIBC, IRON, RETICCTPCT in the last 72 hours.  Coagulation profile No results for input(s): INR, PROTIME in the last 168 hours.  No results for input(s): DDIMER in the last 72 hours.  Cardiac Enzymes No results for input(s): CKMB, TROPONINI, MYOGLOBIN in the last 168 hours.  Invalid input(s): CK ------------------------------------------------------------------------------------------------------------------ Invalid input(s): POCBNP    Cloyde Oregel D.O. on 11/29/2015 at 1:22 PM  Between 7am to 7pm - Pager - [phone]  After 7pm go to [email or web address] - password [email or web address]  And look for the night coverage person covering for me after hours  [facility] Office  [phone]

## 2015-11-29 NOTE — Progress Notes (Signed)
Patient requested pain medicine prior to due time. RN explained to the pt that pain meds were not due. RN paged N.P on call to notify them that pt was requesting more pain medicine. Patient became agitated and very upset when told that meds were not due at this time. Patient removed his Ng tube from wall suction, came out of the room up to the nurse station yelling and acting belligerent. Escorted back to room by charge nurse. Patient found drinking water from the sink in the room. Charge nurse tried to calm patient down with no results. House supervisor was paged to come talk to the patient. Patient yelling and rude to house supervisor also. Will continue to monitor the patient.

## 2015-11-29 NOTE — Progress Notes (Signed)
PARENTERAL NUTRITION CONSULT NOTE - FOLLOW-UP  Pharmacy Consult for TPN Indication: Bowel obstruction  Allergies  Allergen Reactions  . Zetia [Ezetimibe] Anaphylaxis and Swelling    Tongue and throat   . Atorvastatin Other (See Comments)    Malaise & muscle weakness  . Penicillins Other (See Comments)    Unknown  . Pravastatin Sodium     Pravastatin 40 mg qday and 40 mg q M/W/F caused muscle aches    Patient Measurements: Height: 6\' 2"  (188 cm) Weight: 141 lb 8.6 oz (64.2 kg) IBW/kg (Calculated) : 82.2 Usual Weight: 137-140 lbs  Vital Signs: Temp: 98 F (36.7 C) (01/04 0603) Temp Source: Oral (01/04 0603) BP: 149/79 mmHg (01/04 0603) Pulse Rate: 56 (01/04 0603) Intake/Output from previous day: 01/03 0701 - 01/04 0700 In: 997.4 [I.V.:997.4] Out: 2500 [Urine:700; Emesis/NG output:1800] Intake/Output from this shift:    Labs:  Recent Labs  11/27/15 0555 11/28/15 0557 11/29/15 0452  WBC 5.4 4.8 6.6  HGB 12.5* 12.0* 11.6*  HCT 39.1 37.2* 36.4*  PLT 122* 149* PLATELET CLUMPS NOTED ON SMEAR, UNABLE TO ESTIMATE     Recent Labs  11/27/15 0555 11/28/15 0557 11/29/15 0452  NA 143 141 144  K 4.1 4.0 3.9  CL 106 108 106  CO2 27 27 27   GLUCOSE 123* 138* 149*  BUN 12 12 13   CREATININE 0.62 0.49* 0.58*  CALCIUM 9.4 9.0 9.2  MG 1.7  --  1.7  PHOS 3.1  --  2.7  PROT 6.5 5.9* 5.8*  ALBUMIN 3.7 3.2* 3.3*  AST 29 21 20   ALT 24 20 18   ALKPHOS 57 55 58  BILITOT 1.4* 1.0 1.1  PREALBUMIN 3.7*  --   --   TRIG 124  --   --    Estimated Creatinine Clearance: 85.8 mL/min (by C-G formula based on Cr of 0.58).    Recent Labs  11/28/15 1742 11/28/15 2355 11/29/15 0558  GLUCAP 106* 121* 136*    Medical History: Past Medical History  Diagnosis Date  . Hyperlipidemia   . Hypertension   . Leg pain   . Peripheral vascular disease ([place])     s/p prior Ao-Bifem BPG  . 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  Coronary artery disease     a. NSTEMI 7/14=> LHC 06/07/13: Proximal LAD 30%, ostial D1 30%, AV circumflex 80%, then occluded, ostial OM2 occluded, left to left collaterals, mid RCA 95%, inferior HK, EF 60%. PCI: Promus DES to the mid RCA.  No intervention was recommended for the CTO circumflex.  . Ischemic cardiomyopathy     a. Echocardiogram 06/06/13: EF 35-40%, inferior AK, mild MR.  Marland Kitchen Shortness of breath     "can come on at anytime lately; before that it was just w/exertion" (11/10/2013)  . Type II diabetes mellitus ([place])   . [place] mal seizure [facility])     "last one was ~ 3 yr ago; controlled w/daily RX" (11/10/2013)  . Arthritis     "neck, back, legs" (11/10/2013)  . Kidney stones     "when I was younger; passed w/o treatment" (11/10/2013)  . Myocardial infarction ([place])   . Diabetic neuropathy ([place])   . H. pylori infection   . Wears glasses     Medications:  Infusions:  . dextrose 5 % and 0.9 % NaCl with KCl 20 mEq/L 35 mL/hr at 11/28/15 2013  . Marland KitchenTPN (CLINIMIX-E) Adult 30 mL/hr at 11/28/15  2154   And  . fat emulsion 220 mL (11/28/15 2153)    Insulin Requirements in the past 24 hours: None Hx DM  Current Nutrition: NPO  IVF: D5NS KCl 20 meq/L @ 35 ml/hr  Central access: PICC placed 1/3 TPN start date: 1/3  ASSESSMENT                                                                                                          HPI: 28 yoM presented to ED on 12/27 with abdominal pain.  PMH includes HTN, HLD, PVD - s/p fem bypass, CAD s/p NSTEMI 2014, cardiomyopathy, DM, diabetic peripheral neuropathy, seizures, and chronic pain on chronic opioids.  Surgical history significant for open AAA repair.  CT shows partial SBO, likely adhesions.  Pt was made NPO and NGT inserted.  Start TPN while pt is NPO.   Significant events:  1/1 TPN per pharmacy ordered - unable to start without central access.    Today 11/29/2015:   Glucose - at goal < 150, 1 unit of insulin in last 24 hours (Hx DM  on glipizide PTA)  Electrolytes - Mag 1.7, Phos 2.7, both low end of normal; all other lytes wnl  Renal - SCr wnl, stable  LFTs - AST/ALT and Tbili wnl  TGs - 110 (12/30)  Prealbumin - 3.7 (1/2)  Significant output through NG tube, will continue to monitor   NUTRITIONAL GOALS                                                                                             RD recs: 1600-1800 Kcal/day, 65-80 g protein/day, > 2 L fluid/day  Clinimix E 5/15 at a goal rate of 65 ml/hr + 20% fat emulsion at 10 ml/hr to provide: 78 g/day protein, 1588 Kcal/day.  PLAN                                                                                                                          PICC placed on 11/27/14  Advance TPN at 1800:   Clinimix E 5/15 at 50 ml/hr + 20% fat emulsion at 10 ml/hr; Reduce IV fluid to 10 ml/hr  at   1800  Standard MVI, MTE in TPN bag  KPhos 10 mmol x1  Magnesium 1 gram x1   Continue Sensitive SSI q6h   TPN lab panels on Mondays & Thursdays   CMET, Phos, Mg with AM labs  F/u daily.  Royetta Asal, PharmD, BCPS Pager [phone] 11/29/2015 8:01 AM

## 2015-11-30 ENCOUNTER — Inpatient Hospital Stay (HOSPITAL_COMMUNITY): Payer: Medicaid Other

## 2015-11-30 ENCOUNTER — Inpatient Hospital Stay (HOSPITAL_COMMUNITY)
Admission: EM | Admit: 2015-11-30 | Discharge: 2015-11-30 | Disposition: A | Discharge status: Home / Self Care | Payer: Medicaid Other | Source: Home / Self Care | Attending: Internal Medicine | Admitting: Internal Medicine

## 2015-11-30 LAB — CBC
HEMATOCRIT: 39.8 % (ref 39.0–52.0)
HEMOGLOBIN: 12.9 g/dL — AB (ref 13.0–17.0)
MCH: 29.3 pg (ref 26.0–34.0)
MCHC: 32.4 g/dL (ref 30.0–36.0)
MCV: 90.2 fL (ref 78.0–100.0)
Platelets: UNDETERMINED 10*3/uL (ref 150–400)
RBC: 4.41 MIL/uL (ref 4.22–5.81)
RDW: 14.6 % (ref 11.5–15.5)
WBC: 1.4 10*3/uL — AB (ref 4.0–10.5)

## 2015-11-30 LAB — CK: CK TOTAL: 103 U/L (ref 49–397)

## 2015-11-30 LAB — PHOSPHORUS: PHOSPHORUS: 2.5 mg/dL (ref 2.5–4.6)

## 2015-11-30 LAB — COMPREHENSIVE METABOLIC PANEL
ALBUMIN: 3.2 g/dL — AB (ref 3.5–5.0)
ALK PHOS: 57 U/L (ref 38–126)
ALT: 18 U/L (ref 17–63)
ANION GAP: 11 (ref 5–15)
AST: 21 U/L (ref 15–41)
BUN: 15 mg/dL (ref 6–20)
CALCIUM: 8.9 mg/dL (ref 8.9–10.3)
CHLORIDE: 103 mmol/L (ref 101–111)
CO2: 26 mmol/L (ref 22–32)
CREATININE: 0.6 mg/dL — AB (ref 0.61–1.24)
GFR calc non Af Amer: >60 mL/min (ref >60)
GLUCOSE: 214 mg/dL — AB (ref 65–99)
Potassium: 3.7 mmol/L (ref 3.5–5.1)
SODIUM: 140 mmol/L (ref 135–145)
Total Bilirubin: 0.9 mg/dL (ref 0.3–1.2)
Total Protein: 6 g/dL — ABNORMAL LOW (ref 6.5–8.1)

## 2015-11-30 LAB — URINALYSIS, ROUTINE W REFLEX MICROSCOPIC
Glucose, UA: NEGATIVE mg/dL
Hgb urine dipstick: NEGATIVE
Ketones, ur: 15 mg/dL — AB
Leukocytes, UA: NEGATIVE
NITRITE: NEGATIVE
PH: 6 (ref 5.0–8.0)
Protein, ur: NEGATIVE mg/dL
SPECIFIC GRAVITY, URINE: 1.025 (ref 1.005–1.030)

## 2015-11-30 LAB — LACTIC ACID, PLASMA
LACTIC ACID, VENOUS: 1.8 mmol/L (ref 0.5–2.0)
Lactic Acid, Venous: 2.3 mmol/L (ref 0.5–2.0)
Lactic Acid, Venous: 1.8 mmol/L (ref 0.5–2.0)

## 2015-11-30 LAB — MAGNESIUM: Magnesium: 1.5 mg/dL — ABNORMAL LOW (ref 1.7–2.4)

## 2015-11-30 LAB — GLUCOSE, CAPILLARY
GLUCOSE-CAPILLARY: 160 mg/dL — AB (ref 65–99)
GLUCOSE-CAPILLARY: 159 mg/dL — AB (ref 65–99)
Glucose-Capillary: 184 mg/dL — ABNORMAL HIGH (ref 65–99)

## 2015-11-30 LAB — INFLUENZA PANEL BY PCR (TYPE A & B)
H1N1FLUPCR: NOT DETECTED
INFLAPCR: NEGATIVE
INFLBPCR: NEGATIVE

## 2015-11-30 LAB — AMMONIA: AMMONIA: 69 umol/L — AB (ref 9–35)

## 2015-11-30 MED ORDER — TRACE MINERALS CR-CU-MN-SE-ZN 10-1000-500-60 MCG/ML IV SOLN
INTRAVENOUS | Status: AC
  Administered 2015-11-30: 17:00:00 via INTRAVENOUS
  Filled 2015-11-30: qty 1200

## 2015-11-30 MED ORDER — FAT EMULSION 20 % IV EMUL
240.0000 mL | INTRAVENOUS | Status: AC
  Administered 2015-11-30: 240 mL via INTRAVENOUS
  Filled 2015-11-30: qty 250

## 2015-11-30 MED ORDER — INSULIN ASPART 100 UNIT/ML ~~LOC~~ SOLN
0.0000 [IU] | Freq: Four times a day (QID) | SUBCUTANEOUS | Status: DC
  Administered 2015-11-30 (×2): 3 [IU] via SUBCUTANEOUS
  Administered 2015-12-01: 5 [IU] via SUBCUTANEOUS
  Administered 2015-12-01: 3 [IU] via SUBCUTANEOUS

## 2015-11-30 MED ORDER — LORAZEPAM 2 MG/ML IJ SOLN
1.0000 mg | Freq: Once | INTRAMUSCULAR | Status: AC
  Administered 2015-11-30: 1 mg via INTRAVENOUS
  Filled 2015-11-30: qty 1

## 2015-11-30 MED ORDER — MAGNESIUM SULFATE 2 GM/50ML IV SOLN
2.0000 g | Freq: Once | INTRAVENOUS | Status: AC
  Administered 2015-11-30: 2 g via INTRAVENOUS
  Filled 2015-11-30: qty 50

## 2015-11-30 MED ORDER — KETOROLAC TROMETHAMINE 30 MG/ML IJ SOLN
30.0000 mg | Freq: Four times a day (QID) | INTRAMUSCULAR | Status: DC | PRN
  Administered 2015-11-30 – 2015-12-01 (×4): 30 mg via INTRAVENOUS
  Filled 2015-11-30 (×4): qty 1

## 2015-11-30 MED ORDER — HYDROMORPHONE HCL 1 MG/ML IJ SOLN
1.0000 mg | Freq: Once | INTRAMUSCULAR | Status: DC
  Filled 2015-11-30: qty 1

## 2015-11-30 MED ORDER — ACETAMINOPHEN 650 MG RE SUPP
650.0000 mg | Freq: Once | RECTAL | Status: AC
  Administered 2015-11-30: 650 mg via RECTAL
  Filled 2015-11-30: qty 1

## 2015-11-30 MED ORDER — IMIPENEM-CILASTATIN 500 MG IV SOLR
500.0000 mg | Freq: Three times a day (TID) | INTRAVENOUS | Status: DC
  Administered 2015-11-30: 500 mg via INTRAVENOUS
  Filled 2015-11-30 (×2): qty 500

## 2015-11-30 MED ORDER — INSULIN ASPART 100 UNIT/ML ~~LOC~~ SOLN: 0.0000 [IU] | Freq: Three times a day (TID) | SUBCUTANEOUS | Status: DC

## 2015-11-30 MED ORDER — HYDROMORPHONE HCL 1 MG/ML IJ SOLN
1.0000 mg | INTRAMUSCULAR | Status: DC | PRN
  Administered 2015-12-01 (×2): 1 mg via INTRAVENOUS
  Filled 2015-11-30 (×3): qty 1

## 2015-11-30 MED ORDER — LACTULOSE ENEMA
300.0000 mL | Freq: Once | ORAL | Status: AC
  Administered 2015-11-30: 300 mL via RECTAL
  Filled 2015-11-30: qty 300

## 2015-11-30 MED ORDER — DEXTROSE 5 % IV SOLN
1.0000 g | Freq: Three times a day (TID) | INTRAVENOUS | Status: DC
  Administered 2015-11-30 – 2015-12-01 (×4): 1 g via INTRAVENOUS
  Filled 2015-11-30 (×5): qty 1

## 2015-11-30 MED ORDER — METRONIDAZOLE IN NACL 5-0.79 MG/ML-% IV SOLN
500.0000 mg | Freq: Three times a day (TID) | INTRAVENOUS | Status: AC
  Administered 2015-11-30 – 2015-12-07 (×21): 500 mg via INTRAVENOUS
  Filled 2015-11-30 (×21): qty 100

## 2015-11-30 MED ORDER — VANCOMYCIN HCL IN DEXTROSE 750-5 MG/150ML-% IV SOLN
750.0000 mg | Freq: Three times a day (TID) | INTRAVENOUS | Status: DC
  Administered 2015-11-30 – 2015-12-02 (×6): 750 mg via INTRAVENOUS
  Filled 2015-11-30 (×10): qty 150

## 2015-11-30 MED ORDER — SODIUM CHLORIDE 0.9 % IV BOLUS (SEPSIS)
2000.0000 mL | Freq: Once | INTRAVENOUS | Status: AC
  Administered 2015-11-30: 2000 mL via INTRAVENOUS

## 2015-11-30 MED ORDER — MAGNESIUM SULFATE 2 GM/50ML IV SOLN: 2.0000 g | Freq: Once | INTRAVENOUS | Status: DC

## 2015-11-30 NOTE — Progress Notes (Signed)
[name] PROGRESS NOTE  [name] [name] [id number] DOB: 1951/10/18 DOA: 11/21/2015 PCP: Marijean Bravo, MD  brief narrative 65 year old male with hypertension, hyperlipidemia, CAD, diabetes mellitus, history of seizures (reportedly stopped Dilantin and placed on Neurontin one month back) presented to the ED with abdominal pain. He was found to have small bowel obstruction on CT scan of the abdomen. Patient refused surgery and being managed conservative late. PICC line placed for TPN. On 1/4 overnight patient became acutely encephalopathic and septic with fever of 102F and hypertensive. Sepsis workup initiated.  Assessment/Plan: Sepsis with acute encephalopathy No clear etiology. My initial suspicion was acute seizures with aspiration pneumonia. No witnessed seizures. Patient was quite somnolent on my evaluation this morning but has been more alert and awake during the day. -WBC dropped to 1.4. Also had mildly elevated lactic acid of 2.3 which normalized. Sepsis pathway was initiated. Blood cultures and urine cultures ordered. UA and chest x-ray unremarkable. -Patient placed on empiric vancomycin and Primaxin (switched to cefepime given his seizure history). -Follow cultures. Supportive care. Symptoms could be associated with significant narcotics that he is receiving (is on 1 mg Dilaudid every 3 hours, was on 2 mg every 2 hours previously and on a PCA pump until 1/1). Provide pain medication with caution. -EEG done not suggestive of acute epileptiform activity.  Small bowel obstruction CCS following. Patient was unable to tolerate clears and with increased abdominal pain, NG placed back. His refusing surgery and a PICC line was placed for TPN. -Serial abdominal exam.   Type 2 diabetes mellitus A1c of 6.9. On sliding scale insulin. Patient is on metformin at home.  CAD Hx of NSTEMI in 2014. Noncompliant with aspirin and  History of seizures Dilantin was stopped about one  month back and was placed on? Neurontin. Reportedly last seizure activity was 2 years ago. EEG done this morning negative for acute seizures.  Tobacco abuse Needs counseling on cessation.  Peripheral vascular disease with nonhealing left foot ulcer Status post left femoral to below-knee popliteal bypass on 07/2015 by Dr. early. He also had left great toe debridement on 11/14/2015 by Dr. Mariea Clonts. Continue wound care  Hypomagnesemia Replenish  Severe protein calorie malnutrition Getting TNA   DVT prophylaxis:  Diet: Nothing by mouth for now given encephalopathy. Resume diet once mental status much improved.  Code Status: Full code Family Communication: None at bedside Disposition Plan: Continue inpatient monitoring   Consultants:  Surgery  Procedures:  None  Antibiotics:  IV vancomycin, cefepime and Flagyl since 1/5.    HPI/Subjective: Seen and examined. Overnight became encephalopathic and septic with fever of 102F, hypertensive and elevated lactic acid. Sepsis workup initiated and placed on empiric antibiotics.  Objective: Filed Vitals:   11/30/15 0910 11/30/15 1100  BP: 125/87 138/84  Pulse: 104 82  Temp: 97.5 F (36.4 C) 98.1 F (36.7 C)  Resp: 18 18    Intake/Output Summary (Last 24 hours) at 11/30/15 1507 Last data filed at 11/30/15 1100  Gross per 24 hour  Intake 1220.5 ml  Output   1400 ml  Net -179.5 ml   Filed Weights   11/21/15 2101  Weight: 64.2 kg (141 lb 8.6 oz)    Exam:   General:  Elderly male lying in bed somnolent on restraints  HEENT: No pallor, dry mucosa  Chest: Clear bilaterally  CVS: Normal S1 and S2, no murmurs  GI: Soft, nondistended, sluggish bowel sounds, nontender  Musculoskeletal: Warm, no edema, chronic left leg ulcer  CNS: Somnolent  and poorly arousable.    Data Reviewed: Basic Metabolic Panel:  Recent Labs Lab 11/25/15 0115 11/26/15 0545 11/27/15 0555 11/28/15 0557 11/29/15 0452 11/30/15 0415   NA 139 141 143 141 144 140  K 4.0 4.0 4.1 4.0 3.9 3.7  CL 105 105 106 108 106 103  CO2 26 27 27 27 27 26   GLUCOSE 77 126* 123* 138* 149* 214*  BUN 9 10 12 12 13 15   CREATININE 0.49* 0.55* 0.62 0.49* 0.58* 0.60*  CALCIUM 8.7* 8.8* 9.4 9.0 9.2 8.9  MG 1.6* 1.7 1.7  --  1.7 1.5*  PHOS  --   --  3.1  --  2.7 2.5   Liver Function Tests:  Recent Labs Lab 11/27/15 0555 11/28/15 0557 11/29/15 0452 11/30/15 0415  AST 29 21 20 21   ALT 24 20 18 18   ALKPHOS 57 55 58 57  BILITOT 1.4* 1.0 1.1 0.9  PROT 6.5 5.9* 5.8* 6.0*  ALBUMIN 3.7 3.2* 3.3* 3.2*   No results for input(s): LIPASE, AMYLASE in the last 168 hours.  Recent Labs Lab 11/30/15 1020  AMMONIA 69*   CBC:  Recent Labs Lab 11/25/15 0500 11/26/15 0545 11/27/15 0555 11/28/15 0557 11/29/15 0452 11/30/15 0415  WBC 2.9* 3.9* 5.4 4.8 6.6 1.4*  NEUTROABS 0.9*  --  3.4  --  4.5  --   HGB 12.0* 12.2* 12.5* 12.0* 11.6* 12.9*  HCT 37.3* 37.6* 39.1 37.2* 36.4* 39.8  MCV 89.4 89.5 90.3 92.1 91.9 90.2  PLT 100* 118* 122* 149* PLATELET CLUMPS NOTED ON SMEAR, UNABLE TO ESTIMATE PLATELET CLUMPS NOTED ON SMEAR, UNABLE TO ESTIMATE   Cardiac Enzymes:  Recent Labs Lab 11/30/15 1020  CKTOTAL 103   BNP (last 3 results) No results for input(s): BNP in the last 8760 hours.  ProBNP (last 3 results) No results for input(s): PROBNP in the last 8760 hours.  CBG:  Recent Labs Lab 11/29/15 1201 11/29/15 1733 11/29/15 2334 11/30/15 0557 11/30/15 1240  GLUCAP 151* 150* 116* 184* 160*    No results found for this or any previous visit (from the past 240 hour(s)).   Studies: Dg Chest Port 1 View  11/30/2015  CLINICAL DATA:  PICC placement. Systemic inflammatory response syndrome. Altered mental status. EXAM: PORTABLE CHEST 1 VIEW COMPARISON:  Single view of the chest 11/21/2015. PA and lateral chest 01/17/2015. FINDINGS: NG tube is in place and courses into the stomach and below the inferior margin of the film. Right side PICC  is also identified with the tip projecting in the lower superior vena cava. Lung volumes are lower than on the comparison studies with basilar atelectasis. No pneumothorax or pleural effusion. Heart size is normal. IMPRESSION: NG tube courses into the stomach and below the inferior margin of film. PICC projects in good position. Bibasilar atelectasis in a low volume chest. Electronically Signed   By: Inge Rise M.D.   On: 11/30/2015 07:25   Dg Abd 2 Views  11/29/2015  CLINICAL DATA:  Follow-up small bowel obstruction EXAM: ABDOMEN - 2 VIEW COMPARISON:  11/27/2015 FINDINGS: NG tube in place again noted. Persistent mild dilated small bowel loops with multiple air-fluid levels consistent with small bowel obstruction. No free abdominal air. IMPRESSION: Persistent mild dilated small bowel loops and multiple air-fluid levels consistent with small bowel obstruction. NG tube in place. No free abdominal air. Electronically Signed   By: Lahoma Crocker M.D.   On: 11/29/2015 11:10   Dg Abd Portable 1v  11/30/2015  CLINICAL DATA:  Nasogastric tube placement.  Initial encounter. EXAM: PORTABLE ABDOMEN - 1 VIEW COMPARISON:  Abdominal radiograph performed 11/29/2015 FINDINGS: The patient's nasogastric tube is noted ending overlying the body of the stomach. There is increased dilatation of small bowel loops, measuring up to 4.8 cm in diameter, concerning for worsening small bowel obstruction. No free intra-abdominal air is seen, though evaluation free air is limited on a single supine view. No acute osseous abnormalities are identified. IMPRESSION: 1. Nasogastric tube noted ending overlying the body of the stomach. 2. Diffuse dilatation of small-bowel loops, measuring up to 4.8 cm in diameter, concerning for worsening small bowel obstruction. No free intra-abdominal air seen. Electronically Signed   By: Garald Balding M.D.   On: 11/30/2015 02:35    Scheduled Meds: . bacitracin   Topical Daily  . ceFEPime (MAXIPIME) IV  1  g Intravenous 3 times per day  . collagenase  1 application Topical Daily  . enoxaparin (LOVENOX) injection  40 mg Subcutaneous Q24H  .  HYDROmorphone (DILAUDID) injection  1 mg Intravenous Once  . insulin aspart  0-15 Units Subcutaneous 4 times per day  . metronidazole  500 mg Intravenous Q8H  . pantoprazole (PROTONIX) IV  40 mg Intravenous Q12H  . sodium chloride  3 mL Intravenous Q12H  . vancomycin  750 mg Intravenous 3 times per day   Continuous Infusions: . dextrose 5 % and 0.9 % NaCl with KCl 20 mEq/L 10 mL/hr at 11/29/15 1727  . Marland KitchenTPN (CLINIMIX-E) Adult 50 mL/hr at 11/29/15 1705   And  . fat emulsion 240 mL (11/29/15 1705)  . Marland KitchenTPN (CLINIMIX-E) Adult     And  . fat emulsion        Time spent:35 minutes    Guinevere Stephenson, [place]  [name] Pager [phone] If 7PM-7AM, please contact night-coverage at [email or web address], password [facility] 11/30/2015, 3:07 PM  LOS: 9 days

## 2015-11-30 NOTE — Progress Notes (Signed)
Rapid Response called to room 1437 at 05:30, Upon arrival to the pt room at 0535 Pt noted to be confused and restless.  Nurse reports pt had, change in mental status and increasing restlessness. Upon assessment of the patient pupils equal and reactive to light 46mm brisk, Pt purposefully moving all extremities. Breath sounds clear and diminished on auscultation pt oxygen saturation 94% on room air. Pt placed on Rapid response monitor and pt noted to be in sinus tach. Abdomen firm, tender and distended. Vitals were obtained HR 114 BP 154/93 O2sat 94% Temp 102.1 Axillary. NP paged to bedside. NP placed orders for and decided there was no need to transfer at this time. Advised bedside nurse to call if further decline or change in pt satus. Vitals on departure BP 137/99 HR 115 O2 sat 93% on room air.

## 2015-11-30 NOTE — Progress Notes (Addendum)
Pt spiked fever early am with change in mental status and tachycardia. RRRN was called. NP to bedside. S: RN reports pt became agitated acutely. Pt endorses feeling poorly. Pt says he is hurting all over-head, arms, legs and abdomen.  O: Frail, then elderly AAM in NAD, but agitated. Appears older than stated age and acutely ill but not toxic. T 102.1 rectally. HR 110s. BP 130-140. RR 18. O2 sat 94% pm RA. wake and answers questions appropriately but fighting staff. Cursing. Card: reg. Tachy. Lungs: Can not examine as pt is uncooperative. Abd: Distended, soft, + tenderness with palpation but no rebound or guarding. MSK: MOE x 4. Neuro: Agitated. PERRL. Speech is clear. Extremity strength 5/5. No focal neuro deficits. Skin: Warm and dry.  A/P: 1. Sepsis-? Etiology of infection. Pt with known SBO and had refused surgery to correct this so one must think about gut as etiology. KUB performed earlier in shift (for NGT placement) without free air but shows progression of SBO. Pt unable to cooperate for r/p CT abdomen at this time. Surgery is following.  Start broad spectrum abx-Vanc and Imipenem. Blood cultures, LA stat. CBC was already drawn and shows leukopenia. Complete CMP is pending, but bili is normal. Add UA, urine culture, CXR. Flu.  2. Tachycardia-secondary to fever and #1.  3. Altered mental status-could be the Ativan he had earlier but that was 4 hours ago. May be secondary to #1 as well.  4. Fever-see #1, tylenol after cultures done. Check Flu, HIV. Droplet precs. 5. SBO-see #1. Continue NGT. Surgery following.  6. Hypomagnesia-replete. 7. NPO/malnutrition-TPN.  Rest per orders.  At present time, pt is hemodynamically stable. Should he deteriorate, low threshold for SDU transfer. Report given to oncoming attending at 0700.  Clance Boll, NP [name] Attempted to call surgery PA, but no return call.  KJKG, NP

## 2015-11-30 NOTE — Procedures (Signed)
ELECTROENCEPHALOGRAM REPORT   Patient: [name] [name]        Age: 65 y.o.        Sex: male Referring Physician: Dr Clementeen Graham Report Date:  11/30/2015        Interpreting Physician: Hulen Luster  History: [name] [name] is an 65 y.o. male admitted with SBO, now with altered mental status  Medications:  I have reviewed the patient's current medications. -patient has received multiple doses of ativan prior to EEG  Conditions of Recording:  This is a 16 channel EEG carried out with the patient in the drowsy state.  Description:  The background activity consists predominantly of a very low voltage, symmetrical, fairly well organized, beta activity, seen from the parieto-occipital and posterior temporal regions. No posterior dominant alpha rhythm is noted. No focal slowing or epileptiform activity is noted.    Hyperventilation was not performed. Intermittent photic stimulation was performed but failed to illicit any change in the tracing.   IMPRESSION: Abnormal EEG due to the presence of generalized low voltage activity and intermittent generalized beta activity. This is likely a medication effect. No epileptiform activity noted.    Jim Like, DO [facility] [phone]  If 7pm- 7am, please page neurology on call as listed in AMION. 11/30/2015, 1:01 PM

## 2015-11-30 NOTE — Progress Notes (Signed)
Patient only able to retain the lactulose enema for about 15 minutes. Patient was placed on a bedpan and he expelled a large amount of watery stool that was brown in color.  Night RN to continue to monitor. Ammonia level has been ordered to be checked in the AM.   [name] [name] [place] 11/30/2015 7:44 PM

## 2015-11-30 NOTE — Progress Notes (Signed)
PARENTERAL NUTRITION CONSULT NOTE - FOLLOW-UP  Pharmacy Consult for TPN Indication: Bowel obstruction  Allergies  Allergen Reactions  . Zetia [Ezetimibe] Anaphylaxis and Swelling    Tongue and throat   . Atorvastatin Other (See Comments)    Malaise & muscle weakness  . Penicillins Other (See Comments)    Unknown  . Pravastatin Sodium     Pravastatin 40 mg qday and 40 mg q M/W/F caused muscle aches    Patient Measurements: Height: 6\' 2"  (188 cm) Weight: 141 lb 8.6 oz (64.2 kg) IBW/kg (Calculated) : 82.2 Usual Weight: 137-140 lbs  Vital Signs: Temp: 102.1 F (38.9 C) (01/05 0539) Temp Source: Axillary (01/05 0539) BP: 154/93 mmHg (01/05 0539) Pulse Rate: 112 (01/05 0539) Intake/Output from previous day: 01/04 0701 - 01/05 0700 In: 1703.8 [P.O.:130; I.V.:135.5; IV Piggyback:603.3; TPN:835] Out: 1050 [Urine:700; Emesis/NG output:350] Intake/Output from this shift:    Labs:  Recent Labs  11/28/15 0557 11/29/15 0452 11/30/15 0415  WBC 4.8 6.6 1.4*  HGB 12.0* 11.6* 12.9*  HCT 37.2* 36.4* 39.8  PLT 149* PLATELET CLUMPS NOTED ON SMEAR, UNABLE TO ESTIMATE PLATELET CLUMPS NOTED ON SMEAR, UNABLE TO ESTIMATE     Recent Labs  11/28/15 0557 11/29/15 0452 11/30/15 0415  NA 141 144 140  K 4.0 3.9 3.7  CL 108 106 103  CO2 27 27 26   GLUCOSE 138* 149* 214*  BUN 12 13 15   CREATININE 0.49* 0.58* 0.60*  CALCIUM 9.0 9.2 8.9  MG  --  1.7 1.5*  PHOS  --  2.7 2.5  PROT 5.9* 5.8* 6.0*  ALBUMIN 3.2* 3.3* 3.2*  AST 21 20 21   ALT 20 18 18   ALKPHOS 55 58 57  BILITOT 1.0 1.1 0.9   Estimated Creatinine Clearance: 85.8 mL/min (by C-G formula based on Cr of 0.6).    Recent Labs  11/29/15 1733 11/29/15 2334 11/30/15 0557  GLUCAP 150* 116* 184*    Medical History: Past Medical History  Diagnosis Date  . Hyperlipidemia   . Hypertension   . Leg pain   . Peripheral vascular disease ([place])     s/p prior Ao-Bifem BPG  . 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 Coronary artery disease     a. NSTEMI 7/14=> LHC 06/07/13: Proximal LAD 30%, ostial D1 30%, AV circumflex 80%, then occluded, ostial OM2 occluded, left to left collaterals, mid RCA 95%, inferior HK, EF 60%. PCI: Promus DES to the mid RCA.  No intervention was recommended for the CTO circumflex.  . Ischemic cardiomyopathy     a. Echocardiogram 06/06/13: EF 35-40%, inferior AK, mild MR.  Marland Kitchen Shortness of breath     "can come on at anytime lately; before that it was just w/exertion" (11/10/2013)  . Type II diabetes mellitus ([place])   . [place] mal seizure [facility])     "last one was ~ 3 yr ago; controlled w/daily RX" (11/10/2013)  . Arthritis     "neck, back, legs" (11/10/2013)  . Kidney stones     "when I was younger; passed w/o treatment" (11/10/2013)  . Myocardial infarction ([place])   . Diabetic neuropathy ([place])   . H. pylori infection   . Wears glasses     Medications:  Infusions:  . dextrose 5 % and 0.9 % NaCl with KCl 20 mEq/L 10 mL/hr at 11/29/15 1727  . Marland KitchenTPN (CLINIMIX-E) Adult 50 mL/hr at 11/29/15 1705   And  . fat  emulsion 240 mL (11/29/15 1705)    Insulin Requirements in the past 24 hours: 3 units Hx DM  Current Nutrition: NPO  IVF: D5NS KCl 20 meq/L @ 35 ml/hr  Central access: PICC placed 1/3 TPN start date: 1/3  ASSESSMENT                                                                                                          HPI: 38 yoM presented to ED on 12/27 with abdominal pain.  PMH includes HTN, HLD, PVD - s/p fem bypass, CAD s/p NSTEMI 2014, cardiomyopathy, DM, diabetic peripheral neuropathy, seizures, and chronic pain on chronic opioids.  Surgical history significant for open AAA repair.  CT shows partial SBO, likely adhesions.  Pt was made NPO and NGT inserted.  Start TPN while pt is NPO.   Significant events:  1/1 TPN per pharmacy ordered - unable to start without central access.   1/3 PICC line placed; TPN started  Today  11/30/2015:   Rapid response called over night for confusion.  Patient febrile (Tm 102.1) and LA elevated (2.3).  Patient started on empiric antibiotic regimen of Vancomycin & Primaxin.   Glucose >200 (Hx DM on glipizide PTA).  Likely due to acute illness and advanced TPN rate.   Electrolytes - Mag 1.5; all other lytes wnl  Renal - SCr wnl, stable  LFTs - AST/ALT and Tbili wnl  TGs - 124 (1/2)  Prealbumin - 3.7 (1/2)  Decreased output through NG tube, will continue to monitor   NUTRITIONAL GOALS                                                                                             RD recs: 1600-1800 Kcal/day, 65-80 g protein/day, > 2 L fluid/day  Clinimix E 5/15 at a goal rate of 65 ml/hr + 20% fat emulsion at 10 ml/hr to provide: 78 g/day protein, 1588 Kcal/day.  PLAN                                                                                                                          PICC placed on 11/27/14  Continue Clinimix E 5/15  at 50 ml/hr + 20% fat emulsion at 10 ml/hr; IV fluid to 10 ml/hr   Standard MVI, MTE in TPN bag  Magnesium 2 gram IV x1   Increase moderate SSI q6h   TPN lab panels on Mondays & Thursdays   CMET, Phos, Mg with AM labs  F/u daily.  Netta Cedars, PharmD, BCPS Pager: [phone] 11/30/2015 7:59 AM

## 2015-11-30 NOTE — Progress Notes (Signed)
CRITICAL VALUE ALERT  Critical value received:  5:52 AM   Date of notification:  11/30/2015   Time of notification:  5:52 AM   Critical value read back:Yes.    Nurse who received alert:  Erling Conte  MD notified (1st page):  Baltazar Najjar  Time of first page:  5:52 AM   MD notified (2nd page):  Time of second page:  Responding MD:  Baltazar Najjar   Time MD responded:  5:53 AM

## 2015-11-30 NOTE — Progress Notes (Signed)
ANTIBIOTIC CONSULT NOTE - INITIAL  Pharmacy Consult for Vancomycin, primaxin Indication: Sepsis  Allergies  Allergen Reactions  . Zetia [Ezetimibe] Anaphylaxis and Swelling    Tongue and throat   . Atorvastatin Other (See Comments)    Malaise & muscle weakness  . Penicillins Other (See Comments)    Unknown  . Pravastatin Sodium     Pravastatin 40 mg qday and 40 mg q M/W/F caused muscle aches    Patient Measurements: Height: 6\' 2"  (188 cm) Weight: 141 lb 8.6 oz (64.2 kg) IBW/kg (Calculated) : 82.2 Adjusted Body Weight:   Vital Signs: Temp: 102.1 F (38.9 C) (01/05 0539) Temp Source: Axillary (01/05 0539) BP: 154/93 mmHg (01/05 0539) Pulse Rate: 112 (01/05 0539) Intake/Output from previous day: 01/04 0701 - 01/05 0700 In: 893.8 [P.O.:130; I.V.:55.5; IV Piggyback:353.3; TPN:355] Out: 1050 [Urine:700; Emesis/NG output:350] Intake/Output from this shift: Total I/O In: 410.5 [I.V.:55.5; TPN:355] Out: 250 [Urine:250]  Labs:  Recent Labs  11/28/15 0557 11/29/15 0452 11/30/15 0415  WBC 4.8 6.6 1.4*  HGB 12.0* 11.6* 12.9*  PLT 149* PLATELET CLUMPS NOTED ON SMEAR, UNABLE TO ESTIMATE PLATELET CLUMPS NOTED ON SMEAR, UNABLE TO ESTIMATE  CREATININE 0.49* 0.58* 0.60*   Estimated Creatinine Clearance: 85.8 mL/min (by C-G formula based on Cr of 0.6). No results for input(s): VANCOTROUGH, VANCOPEAK, VANCORANDOM, GENTTROUGH, GENTPEAK, GENTRANDOM, TOBRATROUGH, TOBRAPEAK, TOBRARND, AMIKACINPEAK, AMIKACINTROU, AMIKACIN in the last 72 hours.   Microbiology: No results found for this or any previous visit (from the past 720 hour(s)).  Medical History: Past Medical History  Diagnosis Date  . Hyperlipidemia   . Hypertension   . Leg pain   . Peripheral vascular disease ([place])     s/p prior Ao-Bifem BPG  . 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 Coronary artery disease     a. NSTEMI 7/14=> LHC 06/07/13: Proximal LAD 30%, ostial  D1 30%, AV circumflex 80%, then occluded, ostial OM2 occluded, left to left collaterals, mid RCA 95%, inferior HK, EF 60%. PCI: Promus DES to the mid RCA.  No intervention was recommended for the CTO circumflex.  . Ischemic cardiomyopathy     a. Echocardiogram 06/06/13: EF 35-40%, inferior AK, mild MR.  Marland Kitchen Shortness of breath     "can come on at anytime lately; before that it was just w/exertion" (11/10/2013)  . Type II diabetes mellitus ([place])   . [place] mal seizure [facility])     "last one was ~ 3 yr ago; controlled w/daily RX" (11/10/2013)  . Arthritis     "neck, back, legs" (11/10/2013)  . Kidney stones     "when I was younger; passed w/o treatment" (11/10/2013)  . Myocardial infarction ([place])   . Diabetic neuropathy ([place])   . H. pylori infection   . Wears glasses     Medications:  Anti-infectives    Start     Dose/Rate Route Frequency Ordered Stop   11/30/15 0645  vancomycin (VANCOCIN) IVPB 750 mg/150 ml premix     750 mg 150 mL/hr over 60 Minutes Intravenous 3 times per day 11/30/15 0630     11/30/15 0645  imipenem-cilastatin (PRIMAXIN) 500 mg in sodium chloride 0.9 % 100 mL IVPB     500 mg 200 mL/hr over 30 Minutes Intravenous 3 times per day 11/30/15 0630       Assessment: Patient with sepsis.  Goal of Therapy:  Vancomycin trough level 15-20 mcg/ml  Plan:  Measure antibiotic drug  levels at steady state Follow up culture results Vancomycin 750mg  iv q8hr, primaxin 500mg  iv q8hr  Tyler Deis, Shea Stakes [place] 11/30/2015,6:31 AM

## 2015-11-30 NOTE — Progress Notes (Signed)
Nutrition Follow-up  DOCUMENTATION CODES:   Underweight  INTERVENTION:  - Continue TPN per pharmacy - RD will continue to monitor for needs  NUTRITION DIAGNOSIS:   Inadequate oral intake related to inability to eat as evidenced by NPO status. -ongoing  GOAL:   Patient will meet greater than or equal to 90% of their needs -will be met with TPN regimen  MONITOR:   Weight trends, Labs, Skin, I & O's, Other (Comment) (TPN regimen)  ASSESSMENT:   65 y.o. male with a past medical history of hyperlipidemia, hypertension, CAD, type 2 diabetes, diabetic peripheral neuropathy, seizure disorder who comes to the ER due to abdominal pain since this morning, shortly after he woke up, associated with abdominal distention, nausea and 2 episodes of emesis, one at home and one in the emergency department this afternoon per patient. He denies fever, chills, diarrhea, constipation, melena or hematochezia.   1/5 Continue to be unable to complete physical assessment due to agitation which required restraints overnight. Pt calling out at this time. Per chart review, pt's weight has been fluctuating (137-145 lbs) since 07/25/15.   PICC now in place with TPN initiated 1/3 PM. Visualized Clinimix E 5/15 @ 50 mL/hr with 20% lipids @ 10 mL/hr infusing at time of RD visit. This is providing 60 grams protein (92% minimum estimated protein needs) and 1332 kcal (83% minimum estimated kcal needs). Per pharmacy, plan is for goal for TPN: Clinimix E 5/15 @ 65 mL/hr with 20% lipids @ 10 mL/hr which will provide 78 grams of protein and 1588 kcal (99% minimum estimated kcal needs).  Per pharmacy note today at 0759:  PICC placed on 11/27/14  Continue Clinimix E 5/15 at 50 ml/hr + 20% fat emulsion at 10 ml/hr; IV fluid to 10 ml/hr   Standard MVI, MTE in TPN bag  Magnesium 2 gram IV x1   Increase moderate SSI q6h   TPN lab panels on Mondays & Thursdays   CMET, Phos, Mg with AM labs  Medications reviewed. Labs  reviewed; CBGs: 116-184 mg/dL, Mg: 1.5 mg/dL.   1/2 - Per surgery note, pt refusing surgery and PICC placement.  - Pt to decide nutrition plan tomorrow (1/3).   Diet Order:  Diet NPO time specified TPN (CLINIMIX-E) Adult TPN (CLINIMIX-E) Adult  Skin:  Wound (see comment) (L foot and toe DM ulcers)  Last BM:  1/4  Height:   Ht Readings from Last 1 Encounters:  11/21/15 6' 2" (1.88 m)    Weight:   Wt Readings from Last 1 Encounters:  11/21/15 141 lb 8.6 oz (64.2 kg)    Ideal Body Weight:  86.36 kg (kg)  BMI:  Body mass index is 18.16 kg/(m^2).  Estimated Nutritional Needs:   Kcal:  1600-1800  Protein:  65-80 grams  Fluid:  >/= 2 L/day  EDUCATION NEEDS:   No education needs identified at this time     Jarome Matin, RD, LDN Inpatient Clinical Dietitian Pager # [phone] After hours/weekend pager # [phone]

## 2015-11-30 NOTE — Progress Notes (Signed)
Subjective: Seen early this AM with fever and mental status change, rapid response team and NP up to see him around 6 AM. Temp up to 102.  He had ativan 4 hours prior to incident.  He is currently in 4 point restraints, eyes closed but talking some(dreaming) His abdomen is distended, No bowel sounds.  Objective: Vital signs in last 24 hours: Temp:  [97.5 F (36.4 C)-102.1 F (38.9 C)] 97.5 F (36.4 C) (01/05 0910) Pulse Rate:  [53-112] 104 (01/05 0910) Resp:  [18] 18 (01/05 0910) BP: (125-161)/(74-93) 125/87 mmHg (01/05 0910) SpO2:  [94 %-100 %] 94 % (01/05 0539) Last BM Date: 11/29/15 350 from NG  2 stools recorded Temp down again at 0900 to normal, VSS 4 AM labs shows normal CMP  WBC 1.4, platelets have been low or unable to count last 2 test CXR  Bibasilar atelectasis Abdomen:Nasogastric tube noted ending overlying the body of the stomach. 2. Diffuse dilatation of small-bowel loops, measuring up to 4.8 cm in diameter, concerning for worsening small bowel obstruction. No free intra-abdominal air seen.  Intake/Output from previous day: 01/04 0701 - 01/05 0700 In: 1703.8 [P.O.:130; I.V.:135.5; IV Piggyback:603.3; TPN:835] Out: 1050 [Urine:700; Emesis/NG output:350] Intake/Output this shift:    General appearance: Unresponsive to people in room, eyes closed and dream like responses.  he is unaware of me doing exam. GI: distended more than yesterday, no apparent peritonitis, he does not respond to me touching him.  I hear no BS.    Lab Results:   Recent Labs  11/29/15 0452 11/30/15 0415  WBC 6.6 1.4*  HGB 11.6* 12.9*  HCT 36.4* 39.8  PLT PLATELET CLUMPS NOTED ON SMEAR, UNABLE TO ESTIMATE PLATELET CLUMPS NOTED ON SMEAR, UNABLE TO ESTIMATE    BMET  Recent Labs  11/29/15 0452 11/30/15 0415  NA 144 140  K 3.9 3.7  CL 106 103  CO2 27 26  GLUCOSE 149* 214*  BUN 13 15  CREATININE 0.58* 0.60*  CALCIUM 9.2 8.9   PT/INR No results for input(s): LABPROT, INR in  the last 72 hours.   Recent Labs Lab 11/27/15 0555 11/28/15 0557 11/29/15 0452 11/30/15 0415  AST 29 21 20 21   ALT 24 20 18 18   ALKPHOS 57 55 58 57  BILITOT 1.4* 1.0 1.1 0.9  PROT 6.5 5.9* 5.8* 6.0*  ALBUMIN 3.7 3.2* 3.3* 3.2*     Lipase     Component Value Date/Time   LIPASE 21 11/21/2015 1621     Studies/Results: Dg Chest Port 1 View  11/30/2015  CLINICAL DATA:  PICC placement. Systemic inflammatory response syndrome. Altered mental status. EXAM: PORTABLE CHEST 1 VIEW COMPARISON:  Single view of the chest 11/21/2015. PA and lateral chest 01/17/2015. FINDINGS: NG tube is in place and courses into the stomach and below the inferior margin of the film. Right side PICC is also identified with the tip projecting in the lower superior vena cava. Lung volumes are lower than on the comparison studies with basilar atelectasis. No pneumothorax or pleural effusion. Heart size is normal. IMPRESSION: NG tube courses into the stomach and below the inferior margin of film. PICC projects in good position. Bibasilar atelectasis in a low volume chest. Electronically Signed   By: Inge Rise M.D.   On: 11/30/2015 07:25   Dg Abd 2 Views  11/29/2015  CLINICAL DATA:  Follow-up small bowel obstruction EXAM: ABDOMEN - 2 VIEW COMPARISON:  11/27/2015 FINDINGS: NG tube in place again noted. Persistent mild dilated small bowel loops  with multiple air-fluid levels consistent with small bowel obstruction. No free abdominal air. IMPRESSION: Persistent mild dilated small bowel loops and multiple air-fluid levels consistent with small bowel obstruction. NG tube in place. No free abdominal air. Electronically Signed   By: Lahoma Crocker M.D.   On: 11/29/2015 11:10   Dg Abd Portable 1v  11/30/2015  CLINICAL DATA:  Nasogastric tube placement.  Initial encounter. EXAM: PORTABLE ABDOMEN - 1 VIEW COMPARISON:  Abdominal radiograph performed 11/29/2015 FINDINGS: The patient's nasogastric tube is noted ending overlying the  body of the stomach. There is increased dilatation of small bowel loops, measuring up to 4.8 cm in diameter, concerning for worsening small bowel obstruction. No free intra-abdominal air is seen, though evaluation free air is limited on a single supine view. No acute osseous abnormalities are identified. IMPRESSION: 1. Nasogastric tube noted ending overlying the body of the stomach. 2. Diffuse dilatation of small-bowel loops, measuring up to 4.8 cm in diameter, concerning for worsening small bowel obstruction. No free intra-abdominal air seen. Electronically Signed   By: Garald Balding M.D.   On: 11/30/2015 02:35    Medications: . bacitracin   Topical Daily  . ceFEPime (MAXIPIME) IV  1 g Intravenous 3 times per day  . collagenase  1 application Topical Daily  . enoxaparin (LOVENOX) injection  40 mg Subcutaneous Q24H  . insulin aspart  0-15 Units Subcutaneous TID WC  . magnesium sulfate 1 - 4 g bolus IVPB  2 g Intravenous Once  . metronidazole  500 mg Intravenous Q8H  . pantoprazole (PROTONIX) IV  40 mg Intravenous Q12H  . sodium chloride  2,000 mL Intravenous Once  . sodium chloride  3 mL Intravenous Q12H  . 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 CAD s/p NSTEMI 05/2013 WITH stendts to RCA Ischemic cardiomyopathy  S/p iliac artery stent AODM Diabetic neuropathy Arthritis  Tobacco use Antibiotics: None DVT: SCD/Lovenox  Plan:  Fever of uncertain etiology, new Mental status changes, distension is worse today on an early film.  He is to confused to undergo CT right now.        LOS: 9 days    Umair Rosiles 11/30/2015

## 2015-11-30 NOTE — Progress Notes (Signed)
Pharmacy Antibiotic Follow-up Note  [name] [name] is a 65 y.o. year-old male admitted on 11/21/2015.  The patient is currently on day 1 of Vancomycin and Primaxin for sepsis.  He spiked fever overnight and LA elevated.  Source of infection currently unknown: he is admitted with abdominal pain and SBO, has open wounds of LLE, and PICC line inserted 11/27/14.   He was initially started on Vanc and Primaxin overnight due to PCN allergy.  However, patient has hx of seizures.  Assessment/Plan: After discussion with Dr Clementeen Graham, Primaxin will be changed to Cefepime 1gm IV q8h and Flagyl 500mg  IV q8h.   Continue Vancomycin 750mg  IV q8h Continue to monitor patient's clinical course, culture data & renal function  Temp (24hrs), Avg:100.1 F (37.8 C), Min:99.1 F (37.3 C), Max:102.1 F (38.9 C)   Recent Labs Lab 11/26/15 0545 11/27/15 0555 11/28/15 0557 11/29/15 0452 11/30/15 0415  WBC 3.9* 5.4 4.8 6.6 1.4*    Recent Labs Lab 11/26/15 0545 11/27/15 0555 11/28/15 0557 11/29/15 0452 11/30/15 0415  CREATININE 0.55* 0.62 0.49* 0.58* 0.60*   Estimated Creatinine Clearance: 85.8 mL/min (by C-G formula based on Cr of 0.6).    Allergies  Allergen Reactions  . Zetia [Ezetimibe] Anaphylaxis and Swelling    Tongue and throat   . Atorvastatin Other (See Comments)    Malaise & muscle weakness  . Penicillins Other (See Comments)    Unknown  . Pravastatin Sodium     Pravastatin 40 mg qday and 40 mg q M/W/F caused muscle aches    Antimicrobials this admission: Vancomycin 1/5 >>  Primaxin 1/5 >> 1/5 Cefepime 1/5>> Flagyl 1/5>>  Levels/dose changes this admission:   Microbiology results: 1/5 BCx: IP 1/5 UCx: IP  Thank you for allowing pharmacy to be a part of this patient's care.  Netta Cedars, PharmD, BCPS Pager: [phone] 11/30/2015 9:00 AM

## 2015-11-30 NOTE — Progress Notes (Signed)
Patient was able to tolerate the lactulose enema. RN was able to give all 1048ml of the enema. Patient instructed to attempt to hold the enema for 30 minutes. Night shift RN to follow up with removal of rectal catheter and enema results.   [name] [name] [facility] 11/30/2015 7:19 PM

## 2015-11-30 NOTE — Progress Notes (Signed)
EEG completed, results pending. 

## 2015-11-30 NOTE — Progress Notes (Signed)
CRITICAL VALUE ALERT  Critical value received: Lactic Acid 2.3  Date of notification:  11/30/2015   Time of notification:  0727  Critical value read back:yes  Nurse who received alert:  Kaylyn Layer   MD notified (1st page): Dr. Clementeen Graham  Time of first page:  0728  MD notified (2nd page):  Time of second page:  Responding MD:  Dr. Anders Grant  Time MD responded:  [phone], placed orders for NS bolus 2067ml

## 2015-12-01 ENCOUNTER — Encounter: Payer: Self-pay | Admitting: Vascular Surgery

## 2015-12-01 ENCOUNTER — Inpatient Hospital Stay (HOSPITAL_COMMUNITY): Payer: Medicaid Other

## 2015-12-01 ENCOUNTER — Inpatient Hospital Stay (HOSPITAL_COMMUNITY): Payer: Medicaid Other | Admitting: Anesthesiology

## 2015-12-01 ENCOUNTER — Encounter (HOSPITAL_COMMUNITY)
Admission: EM | Disposition: A | Discharge status: Skilled Nursing Facility | Payer: Self-pay | Source: Home / Self Care | Attending: Internal Medicine

## 2015-12-01 DIAGNOSIS — K5669 Other intestinal obstruction: Secondary | ICD-10-CM

## 2015-12-01 DIAGNOSIS — E43 Unspecified severe protein-calorie malnutrition: Secondary | ICD-10-CM

## 2015-12-01 DIAGNOSIS — A419 Sepsis, unspecified organism: Secondary | ICD-10-CM

## 2015-12-01 HISTORY — PX: LAPAROTOMY: SHX154

## 2015-12-01 LAB — URINE CULTURE: CULTURE: NO GROWTH

## 2015-12-01 LAB — CBC WITH DIFFERENTIAL/PLATELET
BASOS PCT: 1 %
Basophils Absolute: 0 10*3/uL (ref 0.0–0.1)
EOS ABS: 0 10*3/uL (ref 0.0–0.7)
EOS PCT: 1 %
HEMATOCRIT: 48 % (ref 39.0–52.0)
Hemoglobin: 16.1 g/dL (ref 13.0–17.0)
LYMPHS ABS: 0.4 10*3/uL — AB (ref 0.7–4.0)
Lymphocytes Relative: 12 %
MCH: 29.5 pg (ref 26.0–34.0)
MCHC: 33.5 g/dL (ref 30.0–36.0)
MCV: 87.9 fL (ref 78.0–100.0)
MONO ABS: 0.3 10*3/uL (ref 0.1–1.0)
Monocytes Relative: 8 %
NEUTROS ABS: 2.6 10*3/uL (ref 1.7–7.7)
NEUTROS PCT: 78 %
PLATELETS: UNDETERMINED 10*3/uL (ref 150–400)
RBC: 5.46 MIL/uL (ref 4.22–5.81)
RDW: 14.6 % (ref 11.5–15.5)
WBC: 3.3 10*3/uL — ABNORMAL LOW (ref 4.0–10.5)

## 2015-12-01 LAB — BLOOD GAS, ARTERIAL
ACID-BASE DEFICIT: 5 mmol/L — AB (ref 0.0–2.0)
Bicarbonate: 20 mEq/L (ref 20.0–24.0)
FIO2: 1
MECHVT: 650 mL
O2 SAT: 98.8 %
PATIENT TEMPERATURE: 98.6
PCO2 ART: 39.1 mmHg (ref 35.0–45.0)
PEEP/CPAP: 5 cmH2O
PH ART: 7.33 — AB (ref 7.350–7.450)
PO2 ART: 209 mmHg — AB (ref 80.0–100.0)
RATE: 14 resp/min
TCO2: 18 mmol/L (ref 0–100)

## 2015-12-01 LAB — BASIC METABOLIC PANEL
ANION GAP: 10 (ref 5–15)
BUN: 16 mg/dL (ref 6–20)
CALCIUM: 8.8 mg/dL — AB (ref 8.9–10.3)
CO2: 23 mmol/L (ref 22–32)
Chloride: 108 mmol/L (ref 101–111)
Creatinine, Ser: 1.07 mg/dL (ref 0.61–1.24)
Glucose, Bld: 167 mg/dL — ABNORMAL HIGH (ref 65–99)
Potassium: 3.8 mmol/L (ref 3.5–5.1)
Sodium: 141 mmol/L (ref 135–145)

## 2015-12-01 LAB — GLUCOSE, CAPILLARY
GLUCOSE-CAPILLARY: 197 mg/dL — AB (ref 65–99)
GLUCOSE-CAPILLARY: 220 mg/dL — AB (ref 65–99)
GLUCOSE-CAPILLARY: 283 mg/dL — AB (ref 65–99)

## 2015-12-01 LAB — MAGNESIUM: MAGNESIUM: 2 mg/dL (ref 1.7–2.4)

## 2015-12-01 LAB — PHOSPHORUS: Phosphorus: 2.3 mg/dL — ABNORMAL LOW (ref 2.5–4.6)

## 2015-12-01 LAB — AMMONIA: Ammonia: 38 umol/L — ABNORMAL HIGH (ref 9–35)

## 2015-12-01 SURGERY — LAPAROTOMY, EXPLORATORY
Anesthesia: General

## 2015-12-01 MED ORDER — ALBUMIN HUMAN 5 % IV SOLN
INTRAVENOUS | Status: DC | PRN
  Administered 2015-12-01: 14:00:00 via INTRAVENOUS

## 2015-12-01 MED ORDER — ENOXAPARIN SODIUM 40 MG/0.4ML ~~LOC~~ SOLN
40.0000 mg | SUBCUTANEOUS | Status: DC
  Administered 2015-12-02: 40 mg via SUBCUTANEOUS
  Filled 2015-12-01: qty 0.4

## 2015-12-01 MED ORDER — POTASSIUM CHLORIDE IN NACL 20-0.9 MEQ/L-% IV SOLN
INTRAVENOUS | Status: DC
  Administered 2015-12-01 – 2015-12-05 (×5): via INTRAVENOUS
  Filled 2015-12-01 (×7): qty 1000

## 2015-12-01 MED ORDER — SODIUM BICARBONATE 8.4 % IV SOLN
INTRAVENOUS | Status: DC | PRN
  Administered 2015-12-01: 50 meq via INTRAVENOUS

## 2015-12-01 MED ORDER — TRACE MINERALS CR-CU-MN-SE-ZN 10-1000-500-60 MCG/ML IV SOLN
INTRAVENOUS | Status: AC
  Administered 2015-12-01: 17:00:00 via INTRAVENOUS
  Filled 2015-12-01: qty 1560

## 2015-12-01 MED ORDER — PHENYLEPHRINE 40 MCG/ML (10ML) SYRINGE FOR IV PUSH (FOR BLOOD PRESSURE SUPPORT)
PREFILLED_SYRINGE | INTRAVENOUS | Status: AC
  Filled 2015-12-01: qty 10

## 2015-12-01 MED ORDER — MIDAZOLAM HCL 2 MG/2ML IJ SOLN
2.0000 mg | INTRAMUSCULAR | Status: DC | PRN
  Administered 2015-12-01: 2 mg via INTRAVENOUS
  Filled 2015-12-01: qty 2

## 2015-12-01 MED ORDER — ROCURONIUM BROMIDE 100 MG/10ML IV SOLN
INTRAVENOUS | Status: AC
  Filled 2015-12-01: qty 1

## 2015-12-01 MED ORDER — INSULIN ASPART 100 UNIT/ML ~~LOC~~ SOLN
0.0000 [IU] | SUBCUTANEOUS | Status: DC
  Administered 2015-12-01 – 2015-12-02 (×3): 8 [IU] via SUBCUTANEOUS
  Administered 2015-12-02 (×4): 2 [IU] via SUBCUTANEOUS
  Administered 2015-12-02: 3 [IU] via SUBCUTANEOUS
  Administered 2015-12-03 (×2): 2 [IU] via SUBCUTANEOUS
  Administered 2015-12-03 – 2015-12-04 (×5): 3 [IU] via SUBCUTANEOUS
  Administered 2015-12-05 (×2): 2 [IU] via SUBCUTANEOUS
  Administered 2015-12-05: 3 [IU] via SUBCUTANEOUS
  Administered 2015-12-05: 2 [IU] via SUBCUTANEOUS
  Administered 2015-12-05: 3 [IU] via SUBCUTANEOUS
  Administered 2015-12-06 (×4): 2 [IU] via SUBCUTANEOUS
  Administered 2015-12-06: 3 [IU] via SUBCUTANEOUS
  Administered 2015-12-06 – 2015-12-07 (×3): 2 [IU] via SUBCUTANEOUS
  Administered 2015-12-07: 3 [IU] via SUBCUTANEOUS
  Administered 2015-12-07: 2 [IU] via SUBCUTANEOUS
  Administered 2015-12-08: 3 [IU] via SUBCUTANEOUS
  Administered 2015-12-08 (×2): 2 [IU] via SUBCUTANEOUS
  Administered 2015-12-08 – 2015-12-09 (×3): 3 [IU] via SUBCUTANEOUS
  Administered 2015-12-09 – 2015-12-12 (×4): 2 [IU] via SUBCUTANEOUS
  Administered 2015-12-13: 3 [IU] via SUBCUTANEOUS
  Administered 2015-12-13 – 2015-12-16 (×11): 2 [IU] via SUBCUTANEOUS

## 2015-12-01 MED ORDER — FAT EMULSION 20 % IV EMUL
240.0000 mL | INTRAVENOUS | Status: AC
Start: 2015-12-01 — End: 2015-12-02
  Administered 2015-12-01: 240 mL via INTRAVENOUS
  Filled 2015-12-01: qty 250

## 2015-12-01 MED ORDER — DEXTROSE 5 % IV SOLN
15.0000 mmol | Freq: Once | INTRAVENOUS | Status: AC
  Administered 2015-12-01: 15 mmol via INTRAVENOUS
  Filled 2015-12-01: qty 5

## 2015-12-01 MED ORDER — DEXTROSE 5 % IV SOLN
1.0000 g | Freq: Three times a day (TID) | INTRAVENOUS | Status: DC
  Administered 2015-12-01 – 2015-12-02 (×2): 1 g via INTRAVENOUS
  Filled 2015-12-01 (×3): qty 1

## 2015-12-01 MED ORDER — SODIUM BICARBONATE 8.4 % IV SOLN
INTRAVENOUS | Status: AC
  Filled 2015-12-01: qty 50

## 2015-12-01 MED ORDER — LACTATED RINGERS IV SOLN
INTRAVENOUS | Status: DC | PRN
  Administered 2015-12-01 (×2): via INTRAVENOUS

## 2015-12-01 MED ORDER — ETOMIDATE 2 MG/ML IV SOLN
INTRAVENOUS | Status: DC | PRN
  Administered 2015-12-01: 16 mg via INTRAVENOUS

## 2015-12-01 MED ORDER — LIDOCAINE HCL 1 % IJ SOLN
INTRAMUSCULAR | Status: AC
  Filled 2015-12-01: qty 40

## 2015-12-01 MED ORDER — FENTANYL CITRATE (PF) 100 MCG/2ML IJ SOLN
INTRAMUSCULAR | Status: DC | PRN
  Administered 2015-12-01 (×5): 50 ug via INTRAVENOUS

## 2015-12-01 MED ORDER — LIDOCAINE HCL (CARDIAC) 20 MG/ML IV SOLN
INTRAVENOUS | Status: DC | PRN
  Administered 2015-12-01: 50 mg via INTRAVENOUS

## 2015-12-01 MED ORDER — FENTANYL CITRATE (PF) 250 MCG/5ML IJ SOLN
INTRAMUSCULAR | Status: AC
  Filled 2015-12-01: qty 5

## 2015-12-01 MED ORDER — FENTANYL CITRATE (PF) 100 MCG/2ML IJ SOLN
100.0000 ug | INTRAMUSCULAR | Status: DC | PRN
  Administered 2015-12-01 (×2): 100 ug via INTRAVENOUS
  Filled 2015-12-01 (×2): qty 2

## 2015-12-01 MED ORDER — PHENYLEPHRINE HCL 10 MG/ML IJ SOLN
INTRAMUSCULAR | Status: AC
  Filled 2015-12-01: qty 1

## 2015-12-01 MED ORDER — PHENYLEPHRINE HCL 10 MG/ML IJ SOLN
INTRAMUSCULAR | Status: DC | PRN
  Administered 2015-12-01 (×2): 80 ug via INTRAVENOUS

## 2015-12-01 MED ORDER — SUCCINYLCHOLINE CHLORIDE 20 MG/ML IJ SOLN
INTRAMUSCULAR | Status: DC | PRN
  Administered 2015-12-01: 100 mg via INTRAVENOUS

## 2015-12-01 MED ORDER — MIDAZOLAM HCL 5 MG/5ML IJ SOLN
INTRAMUSCULAR | Status: DC | PRN
  Administered 2015-12-01 (×2): 1 mg via INTRAVENOUS

## 2015-12-01 MED ORDER — MIDAZOLAM HCL 2 MG/2ML IJ SOLN
INTRAMUSCULAR | Status: AC
  Filled 2015-12-01: qty 2

## 2015-12-01 MED ORDER — LIDOCAINE HCL (CARDIAC) 20 MG/ML IV SOLN
INTRAVENOUS | Status: AC
  Filled 2015-12-01: qty 5

## 2015-12-01 MED ORDER — PROPOFOL 10 MG/ML IV BOLUS
INTRAVENOUS | Status: AC
  Filled 2015-12-01: qty 20

## 2015-12-01 MED ORDER — ALBUMIN HUMAN 5 % IV SOLN
INTRAVENOUS | Status: AC
  Filled 2015-12-01: qty 250

## 2015-12-01 MED ORDER — 0.9 % SODIUM CHLORIDE (POUR BTL) OPTIME
TOPICAL | Status: DC | PRN
  Administered 2015-12-01: 2000 mL

## 2015-12-01 MED ORDER — SODIUM CHLORIDE 0.9 % IR SOLN
Status: DC | PRN
  Administered 2015-12-01: 2000 mL

## 2015-12-01 MED ORDER — ROCURONIUM BROMIDE 100 MG/10ML IV SOLN
INTRAVENOUS | Status: DC | PRN
  Administered 2015-12-01 (×2): 50 mg via INTRAVENOUS

## 2015-12-01 MED ORDER — PHENYLEPHRINE HCL 10 MG/ML IJ SOLN
10.0000 mg | INTRAVENOUS | Status: DC | PRN
  Administered 2015-12-01: 20 ug/min via INTRAVENOUS

## 2015-12-01 MED ORDER — PROMETHAZINE HCL 25 MG/ML IJ SOLN: 6.2500 mg | Freq: Four times a day (QID) | INTRAMUSCULAR | Status: DC | PRN

## 2015-12-01 MED ORDER — HYDROMORPHONE HCL 1 MG/ML IJ SOLN
1.0000 mg | INTRAMUSCULAR | Status: DC | PRN
  Administered 2015-12-01 – 2015-12-02 (×2): 1 mg via INTRAVENOUS
  Administered 2015-12-02: 0.5 mg via INTRAVENOUS
  Filled 2015-12-01 (×3): qty 1

## 2015-12-01 SURGICAL SUPPLY — 49 items
APPLICATOR COTTON TIP 6IN STRL (MISCELLANEOUS) ×3 IMPLANT
BLADE EXTENDED COATED 6.5IN (ELECTRODE) ×3 IMPLANT
BLADE HEX COATED 2.75 (ELECTRODE) ×3 IMPLANT
CATH FOLEY 2WAY 5CC 16FR (CATHETERS)
CATH URTH STD 16FR FL 2W DRN (CATHETERS) IMPLANT
COVER MAYO STAND STRL (DRAPES) ×3 IMPLANT
COVER SURGICAL LIGHT HANDLE (MISCELLANEOUS) IMPLANT
DRAIN CHANNEL 19F RND (DRAIN) IMPLANT
DRAPE LAPAROSCOPIC ABDOMINAL (DRAPES) ×3 IMPLANT
DRAPE WARM FLUID 44X44 (DRAPE) ×3 IMPLANT
ELECT REM PT RETURN 9FT ADLT (ELECTROSURGICAL) ×3
ELECTRODE REM PT RTRN 9FT ADLT (ELECTROSURGICAL) ×1 IMPLANT
EVACUATOR SILICONE 100CC (DRAIN) IMPLANT
GAUZE SPONGE 4X4 12PLY STRL (GAUZE/BANDAGES/DRESSINGS) ×3 IMPLANT
GLOVE BIOGEL M 8.0 STRL (GLOVE) ×6 IMPLANT
GOWN SPEC L4 XLG W/TWL (GOWN DISPOSABLE) ×3 IMPLANT
GOWN STRL REUS W/TWL XL LVL3 (GOWN DISPOSABLE) IMPLANT
HANDLE SUCTION POOLE (INSTRUMENTS) ×1 IMPLANT
HANDPIECE INTERPULSE COAX TIP (DISPOSABLE) ×2
KIT BASIN OR (CUSTOM PROCEDURE TRAY) ×3 IMPLANT
LIGASURE IMPACT 36 18CM CVD LR (INSTRUMENTS) IMPLANT
NS IRRIG 1000ML POUR BTL (IV SOLUTION) ×6 IMPLANT
PACK GENERAL/GYN (CUSTOM PROCEDURE TRAY) ×3 IMPLANT
PAD ABD 8X10 STRL (GAUZE/BANDAGES/DRESSINGS) ×3 IMPLANT
SET HNDPC FAN SPRY TIP SCT (DISPOSABLE) ×1 IMPLANT
SPONGE LAP 18X18 X RAY DECT (DISPOSABLE) ×6 IMPLANT
STAPLER VISISTAT 35W (STAPLE) ×3 IMPLANT
SUCTION POOLE HANDLE (INSTRUMENTS) ×3
SUT ETHILON 1 LR 30 (SUTURE) ×9 IMPLANT
SUT NOVA 1 T20/GS 25DT (SUTURE) ×12 IMPLANT
SUT PDS AB 1 CTX 36 (SUTURE) IMPLANT
SUT RET BRIDGE (SUTURE) ×3 IMPLANT
SUT SILK 2 0 (SUTURE) ×2
SUT SILK 2 0 SH CR/8 (SUTURE) ×3 IMPLANT
SUT SILK 2-0 18XBRD TIE 12 (SUTURE) ×1 IMPLANT
SUT SILK 3 0 (SUTURE) ×2
SUT SILK 3 0 SH CR/8 (SUTURE) ×3 IMPLANT
SUT SILK 3-0 18XBRD TIE 12 (SUTURE) ×1 IMPLANT
SUT VIC AB 3-0 SH 18 (SUTURE) ×3 IMPLANT
SUT VIC AB 4-0 RB1 27 (SUTURE) ×2
SUT VIC AB 4-0 RB1 27XBRD (SUTURE) ×1 IMPLANT
SUT VICRYL 0 UR6 27IN ABS (SUTURE) ×3 IMPLANT
SUT VICRYL 2 0 18  UND BR (SUTURE)
SUT VICRYL 2 0 18 UND BR (SUTURE) IMPLANT
TAPE CLOTH SURG 4X10 WHT LF (GAUZE/BANDAGES/DRESSINGS) ×3 IMPLANT
TOWEL OR 17X26 10 PK STRL BLUE (TOWEL DISPOSABLE) ×3 IMPLANT
TOWEL OR NON WOVEN STRL DISP B (DISPOSABLE) ×3 IMPLANT
TRAY FOLEY W/METER SILVER 14FR (SET/KITS/TRAYS/PACK) IMPLANT
TRAY FOLEY W/METER SILVER 16FR (SET/KITS/TRAYS/PACK) ×3 IMPLANT

## 2015-12-01 NOTE — Interval H&P Note (Signed)
History and Physical Interval Note:  12/01/2015 12:22 PM  [name] [name]  has presented today for surgery, with the diagnosis of small bowel obstruction  The various methods of treatment have been discussed with the patient and family. After consideration of risks, benefits and other options for treatment, the patient has consented to  Procedure(s): EXPLORATORY LAPAROTOMY (N/A) as a surgical intervention .  The patient's history has been reviewed, patient examined, no change in status, stable for surgery.  I have reviewed the patient's chart and labs.  Questions were answered to the patient's satisfaction.     Alphonse Asbridge B

## 2015-12-01 NOTE — Progress Notes (Signed)
RT called to bedside due to Pt self extubating.  Pt in no distress and tolerating well on room air.  MD aware, RT to monitor and assess as needed.

## 2015-12-01 NOTE — Progress Notes (Signed)
[place] Progress Note Patient Name: [name] [name] DOB: [birth year] MRN: [id number]   Date of Service  12/01/2015  HPI/Events of Note  Pt self extubated himself.  Looks great.  eICU Interventions  Monitor off vent.     Intervention Category Major Interventions: Other:  Trace Cederberg 12/01/2015, 10:18 PM

## 2015-12-01 NOTE — Brief Op Note (Signed)
11/21/2015 - 12/01/2015  4:14 PM  PATIENT:  [name] [name]  65 y.o. male  PRE-OPERATIVE DIAGNOSIS:  small bowel obstruction  POST-OPERATIVE DIAGNOSIS:  small bowel obstruction  PROCEDURE:  Procedure(s): EXPLORATORY LAPAROTOMY WITH RADICAL PERITONEAL DEBRIDEMENT, CLOSURE OF SMALL BOWEL PERFORATION AND LYSIS OF ADHESIONS. (N/A)  SURGEON:  Surgeon(s) and Role:    * Johnathan Hausen, MD - Primary  PHYSICIAN ASSISTANT:   ASSISTANTS: Sharin Grave, RNFA   ANESTHESIA:   general  EBL:  Total I/O In: 2100 [I.V.:1600; IV Piggyback:500] Out: 100 [Urine:100]  BLOOD ADMINISTERED:none  DRAINS: none   LOCAL MEDICATIONS USED:  NONE  SPECIMEN:  No Specimen  DISPOSITION OF SPECIMEN:  N/A  COUNTS:  YES  TOURNIQUET:  * No tourniquets in log *  DICTATION: .Other Dictation: Dictation Number [id number]  PLAN OF CARE: To ICU directly  PATIENT DISPOSITION:  ICU - intubated and critically ill.   Delay start of Pharmacological VTE agent (>24hrs) due to surgical blood loss or risk of bleeding: yes

## 2015-12-01 NOTE — Transfer of Care (Signed)
Immediate Anesthesia Transfer of Care Note  Patient: [name] [name]  Procedure(s) Performed: Procedure(s): EXPLORATORY LAPAROTOMY WITH RADICAL PERITONEAL DEBRIDEMENT, CLOSURE OF SMALL BOWEL PERFORATION AND LYSIS OF ADHESIONS. (N/A)  Patient Location: ICU  Anesthesia Type:General  Level of Consciousness: sedated, responds to stimulation and Patient remains intubated per anesthesia plan  Airway & Oxygen Therapy: Patient placed on Ventilator (see vital sign flow sheet for setting)  Post-op Assessment: Report given to RN and Post -op Vital signs reviewed and stable  Post vital signs: Reviewed and stable  Last Vitals:  Filed Vitals:   11/30/15 2125 12/01/15 0450  BP: 125/90 90/66  Pulse: 99 111  Temp: 36.3 C 36.6 C  Resp: 18 24    Complications: No apparent anesthesia complications

## 2015-12-01 NOTE — Consult Note (Signed)
PULMONARY / CRITICAL CARE MEDICINE   Name: [name] [name] MRN: [id number] DOB: 1951-10-01    ADMISSION DATE:  11/21/2015 CONSULTATION DATE:  1/6  REFERRING MD:  Hassell Done   CHIEF COMPLAINT:    HISTORY OF PRESENT ILLNESS:   65 year old aam who was admitted on 12/27 w/ abd pain found to be 2/2 SBO on CT scan. Initially managed conservatively, but then on 1/1 was offered surgery given no significant improvement. He refused surgery for several days. His hospital course was complicated by fever spike and delirium on 1/5. At that time has abd was more distended. He had an EEG which was did not show seizures. He finally agreed to surgery on 1/6 d/t no resolution of abd pain. He went for exploratory lap which was notable for significant peritoneal contamination. He is s/p exploration, washout and repair of perforated viscous. PCCM was asked to assess post op and assist w/ post op critical care support.   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 pylori infection; and Wears glasses.  PAST SURGICAL HISTORY: He  has past surgical history that includes Iliac artery stent (05/06/2011); Tonsillectomy; Aorta - bilateral femoral artery bypass graft (11/25/2011); vein bypass graft,aorto-fem-pop (11/24/2012); Inguinal hernia repair (Right); abdominal aortagram (N/A, 11/11/2011); lower extremity angiogram (Bilateral, 11/11/2011); left heart catheterization with coronary angiogram (N/A, 06/07/2013); percutaneous coronary stent intervention (pci-s) (Right, 06/07/2013); left heart catheterization with coronary angiogram (N/A, 12/10/2013); Colonoscopy; Cardiac catheterization (N/A, 07/26/2015);  Cardiac catheterization (Bilateral, 07/26/2015); and Femoral-popliteal Bypass Graft (Left, 07/28/2015).  Allergies  Allergen Reactions  . Zetia [Ezetimibe] Anaphylaxis and Swelling    Tongue and throat   . Atorvastatin Other (See Comments)    Malaise & muscle weakness  . Penicillins Other (See Comments)    Unknown  . Pravastatin Sodium     Pravastatin 40 mg qday and 40 mg q M/W/F caused muscle aches    No current facility-administered medications on file prior to encounter.   Current Outpatient Prescriptions on File Prior to Encounter  Medication Sig  . albuterol (PROVENTIL HFA;VENTOLIN HFA) 108 (90 BASE) MCG/ACT inhaler Inhale 2 puffs into the lungs every 6 (six) hours as needed for wheezing or shortness of breath.  Marland Kitchen aspirin EC 81 MG tablet Take 1 tablet (81 mg total) by mouth daily.  . collagenase (SANTYL) ointment Apply thin layer to wound bed of left foot daily (Patient taking differently: Apply 1 application topically daily. Apply thin layer to wound bed of left foot daily)  . glipiZIDE (GLUCOTROL) 10 MG tablet Take 10 mg by mouth 2 (two) times daily before a meal.  . ketoconazole (NIZORAL) 2 % cream Apply 1 application topically daily as needed for irritation.  . Multiple Vitamins-Minerals (MULTIVITAMIN WITH MINERALS) tablet Take 4 tablets by mouth 3 (three) times daily.  . nitroGLYCERIN (NITROSTAT) 0.4 MG SL tablet Place 0.4 mg under the tongue every 5 (five) minutes as needed for chest pain.  Marland Kitchen oxyCODONE (ROXICODONE) 15 MG immediate release tablet Take 3 tablets (45 mg total) by mouth every 6 (six) hours as needed (severe pain).  Marland Kitchen PATADAY 0.2 % SOLN Place 1 drop into both eyes daily.   . polyethylene glycol (MIRALAX / GLYCOLAX) packet Take 17 g by mouth daily as needed for mild  constipation or moderate constipation.   . triamcinolone cream (KENALOG) 0.1 % Apply 1 application topically 2 (two) times daily as needed (for eczema).  . zolpidem (AMBIEN) 10 MG tablet Take 10 mg by mouth  at bedtime as needed for sleep.  . silver sulfADIAZINE (SILVADENE) 1 % cream Apply 1 application topically daily. (Patient not taking: Reported on 11/14/2015)    FAMILY HISTORY:  His indicated that his mother is deceased. He indicated that his father is deceased. He indicated that both of his sons are alive.   SOCIAL HISTORY: He  reports that he has been smoking Cigarettes.  He has a 23 pack-year smoking history. He has never used smokeless tobacco. He reports that he drinks alcohol. He reports that he uses illicit drugs (Marijuana and Cocaine) about once per week.  REVIEW OF SYSTEMS:   Unable   SUBJECTIVE:  Sedated on vent   VITAL SIGNS: BP 90/66 mmHg  Pulse 111  Temp(Src) 97.8 F (36.6 C) (Axillary)  Resp 24  Ht 6\' 2"  (1.88 m)  Wt 68.5 kg (151 lb 0.2 oz)  BMI 19.38 kg/m2  SpO2 96%  HEMODYNAMICS:    VENTILATOR SETTINGS: Vent Mode:  [-] PRVC FiO2 (%):  [100 %] 100 % Set Rate:  [14 bmp] 14 bmp Vt Set:  [650 mL] 650 mL PEEP:  [5 cmH20] 5 cmH20 Plateau Pressure:  [18 cmH20] 18 cmH20  INTAKE / OUTPUT: I/O last 3 completed shifts: In: 3321.5 [I.V.:375.5; IV [id number] Out: [id number] [Urine:1475; Emesis/NG output:2000]  PHYSICAL EXAMINATION: General:  Chronically ill appearing 65 year old AAM. Sedated on vent  Neuro:  Sedated, opens eyes. Generalized weakness  HEENT:  NCAT, orally intubated  Cardiovascular:  Tachy rrr; no MRG Lungs:  Clear, equal rise on vent. Crackles bases  Abdomen:  Distended. Mid abd dressing CD&I Musculoskeletal:  Equal st and bulk  Skin:  Dry and intact. LLE non healing ulcer dressing   LABS:  BMET  Recent Labs Lab 11/29/15 0452 11/30/15 0415 12/01/15 0315  NA 144 140 141  K 3.9 3.7 3.8  CL 106 103 108  CO2 27 26 23   BUN 13 15 16   CREATININE 0.58* 0.60* 1.07  GLUCOSE 149* 214* 167*    Electrolytes  Recent Labs Lab 11/29/15 0452 11/30/15 0415 12/01/15 0315  CALCIUM 9.2 8.9 8.8*  MG 1.7 1.5* 2.0  PHOS 2.7 2.5 2.3*     CBC  Recent Labs Lab 11/29/15 0452 11/30/15 0415 12/01/15 0315  WBC 6.6 1.4* 3.3*  HGB 11.6* 12.9* 16.1  HCT 36.4* 39.8 48.0  PLT PLATELET CLUMPS NOTED ON SMEAR, UNABLE TO ESTIMATE PLATELET CLUMPS NOTED ON SMEAR, UNABLE TO ESTIMATE PLATELET CLUMPS NOTED ON SMEAR, UNABLE TO ESTIMATE    Coag's No results for input(s): APTT, INR in the last 168 hours.  Sepsis Markers  Recent Labs Lab 11/30/15 0645 11/30/15 1020 11/30/15 1300  LATICACIDVEN 2.3* 1.8 1.8    ABG  Recent Labs Lab 12/01/15 1527  PHART 7.330*  PCO2ART 39.1  PO2ART 209*    Liver Enzymes  Recent Labs Lab 11/28/15 0557 11/29/15 0452 11/30/15 0415  AST 21 20 21   ALT 20 18 18   ALKPHOS 55 58 57  BILITOT 1.0 1.1 0.9  ALBUMIN 3.2* 3.3* 3.2*    Cardiac Enzymes No results for input(s): TROPONINI, PROBNP in the last 168 hours.  Glucose  Recent Labs Lab 11/29/15 2334 11/30/15 0557 11/30/15 1240 11/30/15 1814 12/01/15 0041 12/01/15 0614  GLUCAP 116* 184* 160* 159* 220* 197*  Imaging Dg Abd 2 Views  12/01/2015  CLINICAL DATA:  Follow up small bowel obstruction. EXAM: ABDOMEN - 2 VIEW COMPARISON:  Multiple prior radiographs obtained over the last week. FINDINGS: The nasogastric tube appears unchanged, terminating in the mid abdomen. Small bowel distention in the upper abdomen is unchanged. There is some gas within the colon. There is no free intraperitoneal air on the decubitus view. Vascular stents are noted. IMPRESSION: No significant change in residual partial small bowel obstruction compared with recent priors. The degree of bowel distention is improved from the studies done last week. Electronically Signed   By: Richardean Sale M.D.   On: 12/01/2015 08:44     STUDIES:  1/5 EEG: no seizures. C/w metabolic changes/meds  CULTURES: BCX2 1/5>>>  ANTIBIOTICS: Cefepime 1/5>>> Flagyl 1/5>>> vanc 1/4>>> Imipenem 1/4>>>1/5 (stopped d/t concern for seizure)  SIGNIFICANT  EVENTS:   LINES/TUBES: RUE PICC 1/3>>> OETT 1/6>>>  DISCUSSION: 65 year old male admitted 12/27 for SBO. Initially managed medically but when sxs not improved surgery was recommended on 1/1; the pt refused @ that time. He presents to the ICU on full vent support on 1/6 after abd exploration where he was found to have perforated viscous and peritonitis. He underwent washout & repair. Will Cont IVFs, abx and tele. Gas exchange looks good. We will hope to extubate tomorrow am if no issues over night.   ASSESSMENT / PLAN:  PULMONARY A: Ventilator dependence s/p exploratory laparotomy  H/o nicotine abuse  P:   Full vent support F/u ABG F/u CXR If stable over night will initiate weaning protocol   CARDIOVASCULAR A:  Severe sepsis  H/o cad S/p left fem pop bypass w/ non-healing ulcer  P:  Resume ASA when ok w/surg Tele Wound care to LLE   RENAL A:   No acute  P:   Avoid hypotension  Renal dose meds   GASTROINTESTINAL A:   SBO w/ perforated viscous and resultant peritonitis  Severe Protein calorie malnutrition  P:   Bowel rest Cont TPN Wound care per surgery  PRN Fentanyl   HEMATOLOGIC A:   Neutropenia  Dilutional anemia w/expected post-op blood loss  P:  Trend CBC [place] LMWH   INFECTIOUS A:   Peritonitis in setting of perforation d/t SBO Severe sepsis  P:   abx per above  F/u cultures   ENDOCRINE A:   Type II DM  P:   ssi   NEUROLOGIC A:   Acute Encephalopathy in setting of sepsis  H/o seizure d/o P:   RASS goal: -1 PAD protocol  Off AEDs X 2 mo before admit-->EEG 1/5 negative.  Watch closely for evidence of seizures.  If seizure activity would load Keppra   FAMILY  - Updates: pending   - Inter-disciplinary family meet or Palliative Care meeting due by:  1/13   Erick Colace ACNP-BC [place] Pager # [phone] OR # [phone] if no answer   12/01/2015, 3:50 PM

## 2015-12-01 NOTE — Progress Notes (Signed)
Subjective: He is awake and coherent this AM.  After discussion today he is amenable to surgery.   1000 ml enema yesterday with some results at the time, but none since. He still has a Actuary with him. Objective: Vital signs in last 24 hours: Temp:  [97.4 F (36.3 C)-98.1 F (36.7 C)] 97.8 F (36.6 C) (01/06 0450) Pulse Rate:  [82-111] 111 (01/06 0450) Resp:  [18-24] 24 (01/06 0450) BP: (90-138)/(66-90) 90/66 mmHg (01/06 0450) SpO2:  [96 %-100 %] 96 % (01/06 0450) Last BM Date: 11/29/15 NG 2000, 1225 urine recorded Negative 1.1liters yesterday for I/O No further fever recorded since 3 AM yesterday Labs OK Film this AM: Small bowel distention in the upper abdomen is unchanged. There is some gas within the colon. There is no free intraperitoneal air on the decubitus view. Vascular stents are noted. Intake/Output from previous day: 01/05 0701 - 01/06 0700 In: 2101 [I.V.:240; IV Piggyback:900; TPN:961] Out: 3225 [Urine:1225; Emesis/NG output:2000] Intake/Output this shift:    General appearance: alert, cooperative and no distress GI: soft, still distended but not any worse than before.  few BS.  he had some enemas yesterday with some stool after enema.  Lab Results:   Recent Labs  11/30/15 0415 12/01/15 0315  WBC 1.4* 3.3*  HGB 12.9* 16.1  HCT 39.8 48.0  PLT PLATELET CLUMPS NOTED ON SMEAR, UNABLE TO ESTIMATE PLATELET CLUMPS NOTED ON SMEAR, UNABLE TO ESTIMATE    BMET  Recent Labs  11/30/15 0415 12/01/15 0315  NA 140 141  K 3.7 3.8  CL 103 108  CO2 26 23  GLUCOSE 214* 167*  BUN 15 16  CREATININE 0.60* 1.07  CALCIUM 8.9 8.8*   PT/INR No results for input(s): LABPROT, INR in the last 72 hours.   Recent Labs Lab 11/27/15 0555 11/28/15 0557 11/29/15 0452 11/30/15 0415  AST 29 21 20 21   ALT 24 20 18 18   ALKPHOS 57 55 58 57  BILITOT 1.4* 1.0 1.1 0.9  PROT 6.5 5.9* 5.8* 6.0*  ALBUMIN 3.7 3.2* 3.3* 3.2*     Lipase     Component Value Date/Time    LIPASE 21 11/21/2015 1621     Studies/Results: Dg Chest Port 1 View  11/30/2015  CLINICAL DATA:  PICC placement. Systemic inflammatory response syndrome. Altered mental status. EXAM: PORTABLE CHEST 1 VIEW COMPARISON:  Single view of the chest 11/21/2015. PA and lateral chest 01/17/2015. FINDINGS: NG tube is in place and courses into the stomach and below the inferior margin of the film. Right side PICC is also identified with the tip projecting in the lower superior vena cava. Lung volumes are lower than on the comparison studies with basilar atelectasis. No pneumothorax or pleural effusion. Heart size is normal. IMPRESSION: NG tube courses into the stomach and below the inferior margin of film. PICC projects in good position. Bibasilar atelectasis in a low volume chest. Electronically Signed   By: Inge Rise M.D.   On: 11/30/2015 07:25   Dg Abd 2 Views  11/29/2015  CLINICAL DATA:  Follow-up small bowel obstruction EXAM: ABDOMEN - 2 VIEW COMPARISON:  11/27/2015 FINDINGS: NG tube in place again noted. Persistent mild dilated small bowel loops with multiple air-fluid levels consistent with small bowel obstruction. No free abdominal air. IMPRESSION: Persistent mild dilated small bowel loops and multiple air-fluid levels consistent with small bowel obstruction. NG tube in place. No free abdominal air. Electronically Signed   By: Lahoma Crocker M.D.   On: 11/29/2015 11:10  Dg Abd Portable 1v  11/30/2015  CLINICAL DATA:  Nasogastric tube placement.  Initial encounter. EXAM: PORTABLE ABDOMEN - 1 VIEW COMPARISON:  Abdominal radiograph performed 11/29/2015 FINDINGS: The patient's nasogastric tube is noted ending overlying the body of the stomach. There is increased dilatation of small bowel loops, measuring up to 4.8 cm in diameter, concerning for worsening small bowel obstruction. No free intra-abdominal air is seen, though evaluation free air is limited on a single supine view. No acute osseous abnormalities are  identified. IMPRESSION: 1. Nasogastric tube noted ending overlying the body of the stomach. 2. Diffuse dilatation of small-bowel loops, measuring up to 4.8 cm in diameter, concerning for worsening small bowel obstruction. No free intra-abdominal air seen. Electronically Signed   By: Garald Balding M.D.   On: 11/30/2015 02:35    Medications: . bacitracin   Topical Daily  . ceFEPime (MAXIPIME) IV  1 g Intravenous 3 times per day  . collagenase  1 application Topical Daily  . enoxaparin (LOVENOX) injection  40 mg Subcutaneous Q24H  .  HYDROmorphone (DILAUDID) injection  1 mg Intravenous Once  . insulin aspart  0-15 Units Subcutaneous 4 times per day  . metronidazole  500 mg Intravenous Q8H  . pantoprazole (PROTONIX) IV  40 mg Intravenous Q12H  . sodium chloride  3 mL Intravenous Q12H  . 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 CAD s/p NSTEMI 05/2013 WITH stendts to RCA Ischemic cardiomyopathy  S/p iliac artery stent AODM Diabetic neuropathy Arthritis  Tobacco use Antibiotics: None DVT: SCD/Lovenox  Plan:  No significant improvement this week with NG suction.  He has been here 10 days with SBO. Chronic pain issues and some kind of confusion/encephalopathy yesterday.  He is willing to have surgery now, he has refused in the past.  I will let Dr. Hassell Done know.         LOS: 10 days    JENNINGS,WILLARD 12/01/2015

## 2015-12-01 NOTE — Anesthesia Procedure Notes (Signed)
Procedure Name: Intubation Date/Time: 12/01/2015 12:51 PM Performed by: Noralyn Pick D Pre-anesthesia Checklist: Patient identified, Emergency Drugs available, Suction available and Patient being monitored Patient Re-evaluated:Patient Re-evaluated prior to inductionOxygen Delivery Method: Circle System Utilized Preoxygenation: Pre-oxygenation with 100% oxygen Intubation Type: IV induction, Rapid sequence and Cricoid Pressure applied Laryngoscope Size: Mac and 4 Grade View: Grade III Tube type: Subglottic suction tube Tube size: 7.5 mm Number of attempts: 1 Airway Equipment and Method: Stylet and Oral airway Placement Confirmation: ETT inserted through vocal cords under direct vision,  positive ETCO2 and breath sounds checked- equal and bilateral Secured at: 23 cm Tube secured with: Tape Dental Injury: Teeth and Oropharynx as per pre-operative assessment

## 2015-12-01 NOTE — Progress Notes (Signed)
PARENTERAL NUTRITION CONSULT NOTE - FOLLOW-UP  Pharmacy Consult for TPN Indication: Bowel obstruction  Allergies  Allergen Reactions  . Zetia [Ezetimibe] Anaphylaxis and Swelling    Tongue and throat   . Atorvastatin Other (See Comments)    Malaise & muscle weakness  . Penicillins Other (See Comments)    Unknown  . Pravastatin Sodium     Pravastatin 40 mg qday and 40 mg q M/W/F caused muscle aches    Patient Measurements: Height: 6\' 2"  (188 cm) Weight: 141 lb 8.6 oz (64.2 kg) IBW/kg (Calculated) : 82.2 Usual Weight: 137-140 lbs  Vital Signs: Temp: 97.8 F (36.6 C) (01/06 0450) Temp Source: Axillary (01/06 0450) BP: 90/66 mmHg (01/06 0450) Pulse Rate: 111 (01/06 0450) Intake/Output from previous day: 01/05 0701 - 01/06 0700 In: 2101 [I.V.:240; IV Piggyback:900; TPN:961] Out: 3225 [Urine:1225; Emesis/NG output:2000] Intake/Output from this shift:    Labs:  Recent Labs  11/29/15 0452 11/30/15 0415 12/01/15 0315  WBC 6.6 1.4* 3.3*  HGB 11.6* 12.9* 16.1  HCT 36.4* 39.8 48.0  PLT PLATELET CLUMPS NOTED ON SMEAR, UNABLE TO ESTIMATE PLATELET CLUMPS NOTED ON SMEAR, UNABLE TO ESTIMATE PLATELET CLUMPS NOTED ON SMEAR, UNABLE TO ESTIMATE     Recent Labs  11/29/15 0452 11/30/15 0415 12/01/15 0315  NA 144 140 141  K 3.9 3.7 3.8  CL 106 103 108  CO2 27 26 23   GLUCOSE 149* 214* 167*  BUN 13 15 16   CREATININE 0.58* 0.60* 1.07  CALCIUM 9.2 8.9 8.8*  MG 1.7 1.5* 2.0  PHOS 2.7 2.5 2.3*  PROT 5.8* 6.0*  --   ALBUMIN 3.3* 3.2*  --   AST 20 21  --   ALT 18 18  --   ALKPHOS 58 57  --   BILITOT 1.1 0.9  --    Estimated Creatinine Clearance: 64.2 mL/min (by C-G formula based on Cr of 1.07).    Recent Labs  11/30/15 1814 12/01/15 0041 12/01/15 0614  GLUCAP 159* 220* 197*    Medical History: Past Medical History  Diagnosis Date  . Hyperlipidemia   . Hypertension   . Leg pain   . Peripheral vascular disease ([place])     s/p prior Ao-Bifem BPG  . Pituitary  disorder ([place])     growth on gland, evaluated every 6 months , at [facility]  . Coronary artery disease     a. NSTEMI 7/14=> LHC 06/07/13: Proximal LAD 30%, ostial D1 30%, AV circumflex 80%, then occluded, ostial OM2 occluded, left to left collaterals, mid RCA 95%, inferior HK, EF 60%. PCI: Promus DES to the mid RCA.  No intervention was recommended for the CTO circumflex.  . Ischemic cardiomyopathy     a. Echocardiogram 06/06/13: EF 35-40%, inferior AK, mild MR.  Marland Kitchen Shortness of breath     "can come on at anytime lately; before that it was just w/exertion" (11/10/2013)  . Type II diabetes mellitus ([place])   . [place] mal seizure [facility])     "last one was ~ 3 yr ago; controlled w/daily RX" (11/10/2013)  . Arthritis     "neck, back, legs" (11/10/2013)  . Kidney stones     "when I was younger; passed w/o treatment" (11/10/2013)  . Myocardial infarction ([place])   . Diabetic neuropathy ([place])   . H. pylori infection   . Wears glasses     Medications:  Infusions:  . dextrose 5 % and 0.9 % NaCl with KCl 20 mEq/L 10 mL/hr at 11/29/15 1727  . Marland Kitchen  TPN (CLINIMIX-E) Adult 50 mL/hr at 11/30/15 1657   And  . fat emulsion 240 mL (11/30/15 1658)    Insulin Requirements in the past 24 hours: 11 units   Current Nutrition: NPO  IVF: D5NS KCl 20 meq/L @ 10 ml/hr  Central access: PICC placed 1/3 TPN start date: 1/3  ASSESSMENT                                                                                                          HPI: 16 yoM presented to ED on 12/27 with abdominal pain.  PMH includes HTN, HLD, PVD - s/p fem bypass, CAD s/p NSTEMI 2014, cardiomyopathy, DM, diabetic peripheral neuropathy, seizures, and chronic pain on chronic opioids.  Surgical history significant for open AAA repair.  CT shows partial SBO, likely adhesions.  Patient refusing surgery.  Plan for conservative management with TPN, bowel rest.   Significant events:  1/1: TPN per pharmacy ordered - unable to start without central  access.   1/3: PICC line placed; TPN started 1/5: Acute encephalopathy with fevers (102F) overnight- sepsis protocol initiated.  Broad-spectrum abx started.   Today 12/01/2015:   Glucose remains >150 (Hx DM on glipizide PTA)  Electrolytes - Phos 2.3; all other lytes wnl.  CorrCa wnl.  Renal - Bump in SCr today.  Recorded I/O unchanged (-232ml/24hrs)  LFTs - AST/ALT and Tbili wnl  TGs - 124 (1/2)  Prealbumin - 3.7 (1/2)  Significant NG tube output (1764ml), will continue to monitor   NUTRITIONAL GOALS                                                                                             RD recs: 1600-1800 Kcal/day, 65-80 g protein/day, > 2 L fluid/day  Clinimix E 5/15 at a goal rate of 65 ml/hr + 20% fat emulsion at 10 ml/hr to provide: 78 g/day protein, 1588 Kcal/day.  PLAN                                                                                                                          Increase Clinimix E 5/15 to 65 ml/hr + 20% fat emulsion at 10  ml/hr (goal rate)  Continue D5NS + 66mEq/L KCl at 53ml/hr  Standard MVI, MTE in TPN bag.    Continue q6h CBGs with moderate correction insulin.  Add 10 units insulin in TPN.   KPhos 16mMol IV x1 today.  Recheck Bmet, Phos, Mg with AM labs.  TPN lab panels on Mondays & Thursdays.    Netta Cedars, PharmD, BCPS Pager: [phone] 12/01/2015 8:19 AM

## 2015-12-01 NOTE — H&P (View-Only) (Signed)
  Subjective: NG is back in.  He pulled it out last PM when he was angry over pain med issues.  ? Flatus, he isn't sure.  TNA is going.  I don't hear much in the way of BS, he isn't real distended, and he is soft.    Objective: Vital signs in last 24 hours: Temp:  [98 F (36.7 C)] 98 F (36.7 C) (01/04 0603) Pulse Rate:  [56-59] 56 (01/04 0603) Resp:  [16] 16 (01/04 0603) BP: (145-149)/(79) 149/79 mmHg (01/04 0603) SpO2:  [98 %-99 %] 99 % (01/04 0603) Last BM Date: 11/25/15 1800 from NG yesterday, he got angry and pulled NG last PM Afebrile, VSS Labs OK Intake/Output from previous day: 01/03 0701 - 01/04 0700 In: 997.4 [I.V.:997.4] Out: 2500 [Urine:700; Emesis/NG output:1800] Intake/Output this shift: Total I/O In: -  Out: 225 [Urine:225]  General appearance: alert, cooperative and no distress GI: soft, rare BS, no real flatus, he isn't real tender on exam.  Lab Results:   Recent Labs  11/28/15 0557 11/29/15 0452  WBC 4.8 6.6  HGB 12.0* 11.6*  HCT 37.2* 36.4*  PLT 149* PLATELET CLUMPS NOTED ON SMEAR, UNABLE TO ESTIMATE    BMET  Recent Labs  11/28/15 0557 11/29/15 0452  NA 141 144  K 4.0 3.9  CL 108 106  CO2 27 27  GLUCOSE 138* 149*  BUN 12 13  CREATININE 0.49* 0.58*  CALCIUM 9.0 9.2   PT/INR No results for input(s): LABPROT, INR in the last 72 hours.   Recent Labs Lab 06-Dec-2015 0555 11/28/15 0557 11/29/15 0452  AST 29 21 20   ALT 24 20 18   ALKPHOS 57 55 58  BILITOT 1.4* 1.0 1.1  PROT 6.5 5.9* 5.8*  ALBUMIN 3.7 3.2* 3.3*     Lipase     Component Value Date/Time   LIPASE 21 11/21/2015 1621     Studies/Results: Dg Abd 2 Views  06-Dec-2015  CLINICAL DATA:  Generalized abdominal pain with nausea vomiting. EXAM: ABDOMEN - 2 VIEW COMPARISON:  11/26/2015 FINDINGS: Upright film shows no evidence for intraperitoneal free air. Gas-filled dilated small bowel loops in the upper abdomen persist but are slightly decreased in distention since yesterday's  exam. NG tube tip is in the proximal stomach with proximal port of the NG tube in the region of the GE junction. IMPRESSION: Slight interval improvement in the proximal small bowel dilatation. Electronically Signed   By: Misty Stanley M.D.   On: 12/06/2015 10:00    Medications: . bacitracin   Topical Daily  . collagenase  1 application Topical Daily  . enoxaparin (LOVENOX) injection  40 mg Subcutaneous Q24H  . insulin aspart  0-9 Units Subcutaneous 4 times per day  . magnesium sulfate 1 - 4 g bolus IVPB  1 g Intravenous Once  . pantoprazole (PROTONIX) IV  40 mg Intravenous Q12H  . potassium phosphate IVPB (mmol)  10 mmol Intravenous Once  . 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 CAD s/p NSTEMI 05/2013 WITH stendts to RCA Ischemic cardiomyopathy  S/p iliac artery stent AODM Diabetic neuropathy Arthritis  Tobacco use Antibiotics: None DVT: SCD/Lovenox   Plan:  i will recheck his film, continue NG decompression, and TNA.  I don't think he has improved much since yesterday.    LOS: 8 days    JENNINGS,WILLARD 11/29/2015

## 2015-12-01 NOTE — Progress Notes (Signed)
Pt had daily italian ice at this time. Will continue to monitor.  Carnella [place] I

## 2015-12-01 NOTE — Progress Notes (Signed)
Patient's belongings taken to room 1229 where patient was placed after surgery. His belongings that were taken to that room included: a silver ring, cell phone and charger, glucose meter, key chain with keys, driver's license, small wood stick, clothing- including 2 religious hats and a whole other bag of clothing and shoes. Patient also had other miscellaneous things in a blue bag.   [name] [name] [facility] 12/01/2015 3:33 PM

## 2015-12-01 NOTE — Progress Notes (Signed)
[name] PROGRESS NOTE  [name] [name] [id number] DOB: 1951/03/08 DOA: 11/21/2015 PCP: Marijean Bravo, MD  brief narrative 65 year old male with hypertension, hyperlipidemia, CAD, diabetes mellitus, history of seizures (reportedly stopped Dilantin and placed on Neurontin one month back) presented to the ED with abdominal pain. He was found to have small bowel obstruction on CT scan of the abdomen. Patient refused surgery and being managed conservative late. PICC line placed for TPN. On 1/4 overnight patient became acutely encephalopathic and septic with fever of 102F and hypertensive. Sepsis workup initiated.  Assessment/Plan: Sepsis with acute encephalopathy (on 11/30/2015) No clear etiology. My initial suspicion was acute seizures with aspiration pneumonia. No witnessed seizures. Patient was quite somnolent  all day yesterday but now back to baseline. -WBC dropped to 1.4. Also had mildly elevated lactic acid of 2.3 which normalized. Sepsis pathway was initiated. Follow Blood cultures and urine cultures .. UA and chest x-ray unremarkable. -Patient placed on empiric vancomycin and Primaxin (switched to cefepime given his seizure history). -Follow cultures. Supportive care. Symptoms could be associated with significant narcotics that he is receiving (is on 1 mg Dilaudid every 3 hours, was on 2 mg every 2 hours previously and on a PCA pump until 1/1). Provide pain medication with caution. -EEG done not suggestive of acute epileptiform activity. -Narrow down antibiotics in the morning if cultures negative.  Small bowel obstruction Patient refused surgery for several days and was started on T any after PICC line placed. Finally agreed with surgery today and taken to OR  for excoriated a laparotomy.  Type 2 diabetes mellitus A1c of 6.9. On sliding scale insulin. Patient is on metformin at home.  CAD Hx of NSTEMI in 2014. Noncompliant with aspirin and  History of seizures Dilantin  was stopped about one month back and was placed on? Neurontin. Reportedly last seizure activity was 2 years ago. EEG done this morning negative for acute seizures.  Tobacco abuse Needs counseling on cessation.  Peripheral vascular disease with nonhealing left foot ulcer Status post left femoral to below-knee popliteal bypass on 07/2015 by Dr. early. He also had left great toe debridement on 11/14/2015 by Dr. Mariea Clonts. Continue wound care.  Hypomagnesemia Replenished  Severe protein calorie malnutrition On TNA   DVT prophylaxis:  Diet: NA  Code Status: Full code Family Communication: None at bedside Disposition Plan: Continue inpatient monitoring   Consultants:  Surgery  Procedures:  None  Antibiotics:  IV vancomycin, cefepime and Flagyl since 1/5--    HPI/Subjective: Seen and examined. Appears alert and oriented this morning.  Objective: Filed Vitals:   11/30/15 2125 12/01/15 0450  BP: 125/90 90/66  Pulse: 99 111  Temp: 97.4 F (36.3 C) 97.8 F (36.6 C)  Resp: 18 24    Intake/Output Summary (Last 24 hours) at 12/01/15 1337 Last data filed at 12/01/15 1330  Gross per 24 hour  Intake 3100.99 ml  Output   2425 ml  Net 675.99 ml   Filed Weights   11/21/15 2101  Weight: 64.2 kg (141 lb 8.6 oz)    Exam:   General:  Elderly male lying in bed , not in distress  HEENT: No pallor, dry mucosa, temporal wasting, NG in place draining dark bilious content  Chest: Clear bilaterally  CVS: Normal S1 and S2, no murmurs  GI: Soft, nondistended, sluggish bowel sounds, tender to pressure  Musculoskeletal: Warm, no edema, chronic left leg ulcer  CNS: Alert and oriented    Data Reviewed: Basic Metabolic Panel:  Recent  Labs Lab 11/26/15 0545 11/27/15 0555 11/28/15 0557 11/29/15 0452 11/30/15 0415 12/01/15 0315  NA 141 143 141 144 140 141  K 4.0 4.1 4.0 3.9 3.7 3.8  CL 105 106 108 106 103 108  CO2 27 27 27 27 26 23   GLUCOSE 126* 123* 138* 149*  214* 167*  BUN 10 12 12 13 15 16   CREATININE 0.55* 0.62 0.49* 0.58* 0.60* 1.07  CALCIUM 8.8* 9.4 9.0 9.2 8.9 8.8*  MG 1.7 1.7  --  1.7 1.5* 2.0  PHOS  --  3.1  --  2.7 2.5 2.3*   Liver Function Tests:  Recent Labs Lab 11/27/15 0555 11/28/15 0557 11/29/15 0452 11/30/15 0415  AST 29 21 20 21   ALT 24 20 18 18   ALKPHOS 57 55 58 57  BILITOT 1.4* 1.0 1.1 0.9  PROT 6.5 5.9* 5.8* 6.0*  ALBUMIN 3.7 3.2* 3.3* 3.2*   No results for input(s): LIPASE, AMYLASE in the last 168 hours.  Recent Labs Lab 11/30/15 1020 12/01/15 0315  AMMONIA 69* 38*   CBC:  Recent Labs Lab 11/25/15 0500  11/27/15 0555 11/28/15 0557 11/29/15 0452 11/30/15 0415 12/01/15 0315  WBC 2.9*  < > 5.4 4.8 6.6 1.4* 3.3*  NEUTROABS 0.9*  --  3.4  --  4.5  --  2.6  HGB 12.0*  < > 12.5* 12.0* 11.6* 12.9* 16.1  HCT 37.3*  < > 39.1 37.2* 36.4* 39.8 48.0  MCV 89.4  < > 90.3 92.1 91.9 90.2 87.9  PLT 100*  < > 122* 149* PLATELET CLUMPS NOTED ON SMEAR, UNABLE TO ESTIMATE PLATELET CLUMPS NOTED ON SMEAR, UNABLE TO ESTIMATE PLATELET CLUMPS NOTED ON SMEAR, UNABLE TO ESTIMATE  < > = values in this interval not displayed. Cardiac Enzymes:  Recent Labs Lab 11/30/15 1020  CKTOTAL 103   BNP (last 3 results) No results for input(s): BNP in the last 8760 hours.  ProBNP (last 3 results) No results for input(s): PROBNP in the last 8760 hours.  CBG:  Recent Labs Lab 11/30/15 0557 11/30/15 1240 11/30/15 1814 12/01/15 0041 12/01/15 0614  GLUCAP 184* 160* 159* 220* 197*    Recent Results (from the past 240 hour(s))  Culture, Urine     Status: None   Collection Time: 11/30/15  6:21 AM  Result Value Ref Range Status   Specimen Description URINE, CLEAN CATCH  Final   Special Requests NONE  Final   Culture   Final    NO GROWTH 1 DAY Performed at [facility]    Report Status 12/01/2015 FINAL  Final  Culture, blood (Routine X 2) w Reflex to ID Panel     Status: None (Preliminary result)   Collection  Time: 11/30/15  6:31 AM  Result Value Ref Range Status   Specimen Description BLOOD LEFT ARM  Final   Special Requests IN PEDIATRIC BOTTLE Radar Base  Final   Culture   Final    NO GROWTH 1 DAY Performed at [facility]    Report Status PENDING  Incomplete  Culture, blood (Routine X 2) w Reflex to ID Panel     Status: None (Preliminary result)   Collection Time: 11/30/15  6:45 AM  Result Value Ref Range Status   Specimen Description BLOOD RIGHT PICC LINE  Final   Special Requests BOTTLES DRAWN AEROBIC AND ANAEROBIC 10CC  Final   Culture   Final    NO GROWTH 1 DAY Performed at [facility]    Report Status PENDING  Incomplete     Studies: Dg Chest Port 1 View  11/30/2015  CLINICAL DATA:  PICC placement. Systemic inflammatory response syndrome. Altered mental status. EXAM: PORTABLE CHEST 1 VIEW COMPARISON:  Single view of the chest 11/21/2015. PA and lateral chest 01/17/2015. FINDINGS: NG tube is in place and courses into the stomach and below the inferior margin of the film. Right side PICC is also identified with the tip projecting in the lower superior vena cava. Lung volumes are lower than on the comparison studies with basilar atelectasis. No pneumothorax or pleural effusion. Heart size is normal. IMPRESSION: NG tube courses into the stomach and below the inferior margin of film. PICC projects in good position. Bibasilar atelectasis in a low volume chest. Electronically Signed   By: Inge Rise M.D.   On: 11/30/2015 07:25   Dg Abd 2 Views  12/01/2015  CLINICAL DATA:  Follow up small bowel obstruction. EXAM: ABDOMEN - 2 VIEW COMPARISON:  Multiple prior radiographs obtained over the last week. FINDINGS: The nasogastric tube appears unchanged, terminating in the mid abdomen. Small bowel distention in the upper abdomen is unchanged. There is some gas within the colon. There is no free intraperitoneal air on the decubitus view. Vascular stents are noted. IMPRESSION: No significant  change in residual partial small bowel obstruction compared with recent priors. The degree of bowel distention is improved from the studies done last week. Electronically Signed   By: Richardean Sale M.D.   On: 12/01/2015 08:44   Dg Abd Portable 1v  11/30/2015  CLINICAL DATA:  Nasogastric tube placement.  Initial encounter. EXAM: PORTABLE ABDOMEN - 1 VIEW COMPARISON:  Abdominal radiograph performed 11/29/2015 FINDINGS: The patient's nasogastric tube is noted ending overlying the body of the stomach. There is increased dilatation of small bowel loops, measuring up to 4.8 cm in diameter, concerning for worsening small bowel obstruction. No free intra-abdominal air is seen, though evaluation free air is limited on a single supine view. No acute osseous abnormalities are identified. IMPRESSION: 1. Nasogastric tube noted ending overlying the body of the stomach. 2. Diffuse dilatation of small-bowel loops, measuring up to 4.8 cm in diameter, concerning for worsening small bowel obstruction. No free intra-abdominal air seen. Electronically Signed   By: Garald Balding M.D.   On: 11/30/2015 02:35    Scheduled Meds: . [MAR Hold] bacitracin   Topical Daily  . [MAR Hold] ceFEPime (MAXIPIME) IV  1 g Intravenous 3 times per day  . [MAR Hold] collagenase  1 application Topical Daily  . [MAR Hold] enoxaparin (LOVENOX) injection  40 mg Subcutaneous Q24H  .  HYDROmorphone (DILAUDID) injection  1 mg Intravenous Once  . [MAR Hold] insulin aspart  0-15 Units Subcutaneous 4 times per day  . [MAR Hold] metronidazole  500 mg Intravenous Q8H  . [MAR Hold] pantoprazole (PROTONIX) IV  40 mg Intravenous Q12H  . potassium phosphate IVPB (mmol)  15 mmol Intravenous Once  . [MAR Hold] sodium chloride  3 mL Intravenous Q12H  . [MAR Hold] vancomycin  750 mg Intravenous 3 times per day   Continuous Infusions: . Marland KitchenTPN (CLINIMIX-E) Adult     And  . fat emulsion    . dextrose 5 % and 0.9 % NaCl with KCl 20 mEq/L 10 mL/hr at  11/29/15 1727  . Marland KitchenTPN (CLINIMIX-E) Adult 50 mL/hr at 11/30/15 1657   And  . fat emulsion 240 mL (11/30/15 1658)      Time spent:25 minutes    Dolce Sylvia  [name]  Pager [phone] If 7PM-7AM, please contact night-coverage at [email or web address], password [facility] 12/01/2015, 1:37 PM  LOS: 10 days

## 2015-12-01 NOTE — Progress Notes (Signed)
Pharmacy Antibiotic Follow-up Note  [name] [name] is a 65 y.o. year-old male admitted on 11/21/2015.  The patient is currently on day 1 of Vancomycin and Primaxin for sepsis.  He spiked fever overnight and LA elevated.  Source of infection currently unknown: he is admitted with abdominal pain and SBO, has open wounds of LLE, and PICC line inserted 11/27/14.   He was initially started on Vanc and Primaxin overnight due to PCN allergy.  However, patient has hx of seizures and Primaxin changed to Cefepime + Flagyl  Assessment/Plan:  Continue vancomycin, cefepime, and Flagyl as ordered  Vanc trough tonight at 2130 (original trough at 1330 was canceled d/t surgery)  Continue to monitor patient's clinical course, culture data & renal function  Temp (24hrs), Avg:97.6 F (36.4 C), Min:97.4 F (36.3 C), Max:97.8 F (36.6 C)   Recent Labs Lab 11/27/15 0555 11/28/15 0557 11/29/15 0452 11/30/15 0415 12/01/15 0315  WBC 5.4 4.8 6.6 1.4* 3.3*     Recent Labs Lab 11/27/15 0555 11/28/15 0557 11/29/15 0452 11/30/15 0415 12/01/15 0315  CREATININE 0.62 0.49* 0.58* 0.60* 1.07   Estimated Creatinine Clearance: 68.5 mL/min (by C-G formula based on Cr of 1.07).    Allergies  Allergen Reactions  . Zetia [Ezetimibe] Anaphylaxis and Swelling    Tongue and throat   . Atorvastatin Other (See Comments)    Malaise & muscle weakness  . Penicillins Other (See Comments)    Unknown  . Pravastatin Sodium     Pravastatin 40 mg qday and 40 mg q M/W/F caused muscle aches   Today, 12/01/2015: Temp: afebrile since fever spike on 1/5 AM WBC: low Renal: SCr bumped overnight; CrCl still wnl at 69 ml/min CG  Antimicrobials this admission: Vancomycin 1/5 >>  Primaxin 1/5 >> 1/5 Cefepime 1/5>> Flagyl 1/5>>  Levels/dose changes this admission:   Microbiology results: 1/5 BCx: ngtd 1/5 UCx: ngf  Thank you for allowing pharmacy to be a part of this patient's care.  Reuel Boom, PharmD,  BCPS Pager: [phone] 12/01/2015, 3:51 PM

## 2015-12-01 NOTE — Anesthesia Preprocedure Evaluation (Addendum)
Anesthesia Evaluation  Patient identified by MRN, date of birth, ID band Patient awake  General Assessment Comment:Low O2 sat% on arrival, placed on oxygen  Reviewed: Allergy & Precautions, NPO status , Patient's Chart, lab work & pertinent test results  Airway Mallampati: II  TM Distance: >3 FB Neck ROM: Full    Dental  (+) Edentulous Upper   Pulmonary shortness of breath, Current Smoker,   Mildly tachypneic   + decreased breath sounds      Cardiovascular hypertension, + CAD, + Past MI, + Cardiac Stents and + Peripheral Vascular Disease   Rhythm:Regular Rate:Tachycardia     Neuro/Psych Seizures -,     GI/Hepatic Small bowel obst   Endo/Other  diabetes  Renal/GU Renal InsufficiencyRenal disease     Musculoskeletal  (+) Arthritis ,   Abdominal   Peds  Hematology   Anesthesia Other Findings   Reproductive/Obstetrics                           Anesthesia Physical Anesthesia Plan  ASA: IV and emergent  Anesthesia Plan: General   Post-op Pain Management:    Induction: Intravenous  Airway Management Planned: Oral ETT  Additional Equipment: Arterial line  Intra-op Plan:   Post-operative Plan: Post-operative intubation/ventilation and Possible Post-op intubation/ventilation  Informed Consent: I have reviewed the patients History and Physical, chart, labs and discussed the procedure including the risks, benefits and alternatives for the proposed anesthesia with the patient or authorized representative who has indicated his/her understanding and acceptance.   Dental advisory given  Plan Discussed with: CRNA and Surgeon  Anesthesia Plan Comments:       Anesthesia Quick Evaluation

## 2015-12-02 DIAGNOSIS — K631 Perforation of intestine (nontraumatic): Secondary | ICD-10-CM | POA: Diagnosis present

## 2015-12-02 DIAGNOSIS — K650 Generalized (acute) peritonitis: Secondary | ICD-10-CM | POA: Diagnosis present

## 2015-12-02 DIAGNOSIS — J95821 Acute postprocedural respiratory failure: Secondary | ICD-10-CM

## 2015-12-02 LAB — CBC
HCT: 37.8 % — ABNORMAL LOW (ref 39.0–52.0)
Hemoglobin: 12.4 g/dL — ABNORMAL LOW (ref 13.0–17.0)
MCH: 30.1 pg (ref 26.0–34.0)
MCHC: 32.8 g/dL (ref 30.0–36.0)
MCV: 91.7 fL (ref 78.0–100.0)
Platelets: 123 10*3/uL — ABNORMAL LOW (ref 150–400)
RBC: 4.12 MIL/uL — AB (ref 4.22–5.81)
RDW: 15.5 % (ref 11.5–15.5)
WBC: 5.7 10*3/uL (ref 4.0–10.5)

## 2015-12-02 LAB — GLUCOSE, CAPILLARY
GLUCOSE-CAPILLARY: 136 mg/dL — AB (ref 65–99)
GLUCOSE-CAPILLARY: 273 mg/dL — AB (ref 65–99)
Glucose-Capillary: 171 mg/dL — ABNORMAL HIGH (ref 65–99)
Glucose-Capillary: 145 mg/dL — ABNORMAL HIGH (ref 65–99)
Glucose-Capillary: 139 mg/dL — ABNORMAL HIGH (ref 65–99)
Glucose-Capillary: 122 mg/dL — ABNORMAL HIGH (ref 65–99)

## 2015-12-02 LAB — PHOSPHORUS: Phosphorus: 6.3 mg/dL — ABNORMAL HIGH (ref 2.5–4.6)

## 2015-12-02 LAB — MAGNESIUM: MAGNESIUM: 2.2 mg/dL (ref 1.7–2.4)

## 2015-12-02 LAB — BASIC METABOLIC PANEL
Anion gap: 5 (ref 5–15)
Anion gap: 7 (ref 5–15)
BUN: 29 mg/dL — AB (ref 6–20)
BUN: 31 mg/dL — ABNORMAL HIGH (ref 6–20)
CALCIUM: 7.8 mg/dL — AB (ref 8.9–10.3)
CHLORIDE: 111 mmol/L (ref 101–111)
CO2: 22 mmol/L (ref 22–32)
CO2: 24 mmol/L (ref 22–32)
CREATININE: 1.62 mg/dL — AB (ref 0.61–1.24)
CREATININE: 1.66 mg/dL — AB (ref 0.61–1.24)
Calcium: 7.7 mg/dL — ABNORMAL LOW (ref 8.9–10.3)
Chloride: 105 mmol/L (ref 101–111)
GFR calc non Af Amer: 42 mL/min — ABNORMAL LOW (ref >60)
GFR, EST AFRICAN AMERICAN: 51 mL/min — AB (ref >60)
GFR, EST AFRICAN AMERICAN: 49 mL/min — AB (ref >60)
GFR, EST NON AFRICAN AMERICAN: 44 mL/min — AB (ref >60)
Glucose, Bld: 719 mg/dL (ref 65–99)
Glucose, Bld: 169 mg/dL — ABNORMAL HIGH (ref 65–99)
POTASSIUM: 5.5 mmol/L — AB (ref 3.5–5.1)
Potassium: 4 mmol/L (ref 3.5–5.1)
SODIUM: 132 mmol/L — AB (ref 135–145)
SODIUM: 142 mmol/L (ref 135–145)

## 2015-12-02 LAB — BLOOD GAS, ARTERIAL
Acid-base deficit: 5.6 mmol/L — ABNORMAL HIGH (ref 0.0–2.0)
BICARBONATE: 19.8 meq/L — AB (ref 20.0–24.0)
Drawn by: 308601
FIO2: 0.55
O2 Saturation: 93.6 %
PH ART: 7.311 — AB (ref 7.350–7.450)
Patient temperature: 98.6
TCO2: 17.8 mmol/L (ref 0–100)
pCO2 arterial: 40.4 mmHg (ref 35.0–45.0)
pO2, Arterial: 81.7 mmHg (ref 80.0–100.0)

## 2015-12-02 LAB — VANCOMYCIN, TROUGH
VANCOMYCIN TR: 14 ug/mL (ref 10.0–20.0)
VANCOMYCIN TR: 29 ug/mL — AB (ref 10.0–20.0)

## 2015-12-02 MED ORDER — ALUM & MAG HYDROXIDE-SIMETH 200-200-20 MG/5ML PO SUSP
30.0000 mL | Freq: Four times a day (QID) | ORAL | Status: DC | PRN
  Administered 2015-12-13: 30 mL via ORAL
  Filled 2015-12-02: qty 30

## 2015-12-02 MED ORDER — CETYLPYRIDINIUM CHLORIDE 0.05 % MT LIQD
7.0000 mL | Freq: Two times a day (BID) | OROMUCOSAL | Status: DC
  Administered 2015-12-02 – 2015-12-29 (×26): 7 mL via OROMUCOSAL

## 2015-12-02 MED ORDER — DIPHENHYDRAMINE HCL 50 MG/ML IJ SOLN
12.5000 mg | Freq: Four times a day (QID) | INTRAMUSCULAR | Status: DC | PRN
  Administered 2015-12-02 – 2015-12-20 (×9): 25 mg via INTRAVENOUS
  Filled 2015-12-02 (×9): qty 1

## 2015-12-02 MED ORDER — MENTHOL 3 MG MT LOZG: 1.0000 | LOZENGE | OROMUCOSAL | Status: DC | PRN

## 2015-12-02 MED ORDER — FENTANYL CITRATE (PF) 100 MCG/2ML IJ SOLN
25.0000 ug | INTRAMUSCULAR | Status: DC | PRN
  Administered 2015-12-02 (×2): 50 ug via INTRAVENOUS
  Administered 2015-12-02: 25 ug via INTRAVENOUS
  Administered 2015-12-02 (×2): 50 ug via INTRAVENOUS
  Filled 2015-12-02 (×5): qty 2

## 2015-12-02 MED ORDER — CHLORHEXIDINE GLUCONATE 0.12 % MT SOLN
15.0000 mL | Freq: Two times a day (BID) | OROMUCOSAL | Status: DC
  Administered 2015-12-02 – 2015-12-29 (×43): 15 mL via OROMUCOSAL
  Filled 2015-12-02 (×53): qty 15

## 2015-12-02 MED ORDER — LACTATED RINGERS IV BOLUS (SEPSIS): 1000.0000 mL | Freq: Three times a day (TID) | INTRAVENOUS | Status: AC | PRN

## 2015-12-02 MED ORDER — LACTATED RINGERS IV BOLUS (SEPSIS)
1000.0000 mL | Freq: Once | INTRAVENOUS | Status: AC
  Administered 2015-12-02: 1000 mL via INTRAVENOUS

## 2015-12-02 MED ORDER — FAT EMULSION 20 % IV EMUL
240.0000 mL | INTRAVENOUS | Status: AC
  Administered 2015-12-02: 240 mL via INTRAVENOUS
  Filled 2015-12-02: qty 250

## 2015-12-02 MED ORDER — DEXTROSE 5 % IV SOLN
1.0000 g | Freq: Two times a day (BID) | INTRAVENOUS | Status: AC
  Administered 2015-12-02 – 2015-12-09 (×13): 1 g via INTRAVENOUS
  Filled 2015-12-02 (×16): qty 1

## 2015-12-02 MED ORDER — PHENOL 1.4 % MT LIQD: 2.0000 | OROMUCOSAL | Status: DC | PRN

## 2015-12-02 MED ORDER — LIP MEDEX EX OINT
1.0000 "application " | TOPICAL_OINTMENT | Freq: Two times a day (BID) | CUTANEOUS | Status: DC
  Administered 2015-12-02 – 2015-12-29 (×52): 1 via TOPICAL
  Filled 2015-12-02 (×3): qty 7

## 2015-12-02 MED ORDER — ACETAMINOPHEN 650 MG RE SUPP
650.0000 mg | Freq: Four times a day (QID) | RECTAL | Status: DC | PRN
  Administered 2015-12-13: 650 mg via RECTAL
  Filled 2015-12-02: qty 1

## 2015-12-02 MED ORDER — TRACE MINERALS CR-CU-MN-SE-ZN 10-1000-500-60 MCG/ML IV SOLN
INTRAVENOUS | Status: AC
  Administered 2015-12-02: 17:00:00 via INTRAVENOUS
  Filled 2015-12-02: qty 1560

## 2015-12-02 MED ORDER — VANCOMYCIN HCL IN DEXTROSE 1-5 GM/200ML-% IV SOLN
1000.0000 mg | Freq: Three times a day (TID) | INTRAVENOUS | Status: DC
  Administered 2015-12-02: 1000 mg via INTRAVENOUS
  Filled 2015-12-02: qty 200

## 2015-12-02 MED ORDER — MAGIC MOUTHWASH
15.0000 mL | Freq: Four times a day (QID) | ORAL | Status: DC | PRN
  Administered 2015-12-12 – 2015-12-25 (×4): 15 mL via ORAL
  Filled 2015-12-02 (×6): qty 15

## 2015-12-02 MED ORDER — HALOPERIDOL LACTATE 5 MG/ML IJ SOLN
1.0000 mg | INTRAMUSCULAR | Status: DC | PRN
  Administered 2015-12-02: 2 mg via INTRAVENOUS
  Administered 2015-12-03: 4 mg via INTRAVENOUS
  Filled 2015-12-02 (×2): qty 1

## 2015-12-02 MED ORDER — BISACODYL 10 MG RE SUPP
10.0000 mg | Freq: Two times a day (BID) | RECTAL | Status: DC | PRN
  Administered 2015-12-20 – 2015-12-28 (×2): 10 mg via RECTAL
  Filled 2015-12-02 (×2): qty 1

## 2015-12-02 MED ORDER — SODIUM CHLORIDE 0.9 % IV BOLUS (SEPSIS)
1000.0000 mL | Freq: Once | INTRAVENOUS | Status: AC
  Administered 2015-12-02: 1000 mL via INTRAVENOUS

## 2015-12-02 NOTE — Progress Notes (Signed)
[place] Progress Note Patient Name: [name] [name] DOB: 04-28-1951 MRN: [id number]   Date of Service  12/02/2015  HPI/Events of Note  BP low and decreased urine outpt.  eICU Interventions  Will give 1 liter NS.     Intervention Category Major Interventions: Other:  Vanna Sailer 12/02/2015, 3:29 AM

## 2015-12-02 NOTE — Progress Notes (Signed)
[place] Progress Note Patient Name: [name] RILE DOB: 1951-11-21 MRN: [id number]   Date of Service  12/02/2015  HPI/Events of Note  Pt with agitated delirium  eICU Interventions  Started prn haldol     Intervention Category Major Interventions: Delirium, psychosis, severe agitation - evaluation and management  Asencion Noble 12/02/2015, 6:15 PM

## 2015-12-02 NOTE — Progress Notes (Signed)
Patient calling out approximately every 2-5 minutes with complaints of pain in various parts of the body (head, back, knee, foot, buttocks, abdomen, neck, arm). Pain medication administered, patient repositioned, ice applied, environmental changes, distraction, reorientation, and talking with the patient have all been tried without any changes in patients condition. Patient is increasingly confused and delusional and talking to people who are not in the room. PRN Haldol administered. Will continue to monitor. Luther Parody, RN

## 2015-12-02 NOTE — Op Note (Signed)
NAME:  DOYT, HETZEL NO.:  [id number]  MEDICAL RECORD NO.:  [id number]  LOCATION:  [id number]                         FACILITY:  [facility] ([facility]  PHYSICIAN:  Isabel Caprice. Hassell Done, MD  DATE OF BIRTH:  [birth year]  DATE OF PROCEDURE:  12/01/2015 DATE OF DISCHARGE:                              OPERATIVE REPORT   PREOPERATIVE DIAGNOSIS:  Protracted small-bowel obstruction.  PREOP INDICATIONS:  This patient is a 65 year old African American gentleman, who is about 2 years out from an aorto-bifem and presented about 10 to 12 days ago with abdominal pain and bowel obstruction by x- rays.  The patient has a chronic pain condition and takes narcotics for this.  The patient has adamantly refused surgery until this morning.  At this point, he decided he would allow [place] to operate on him.  We therefore made arrangements for surgery at this time.  SURGEON:  Pedro Earls, M.D.  ASSISTANT:  Anna Genre, RNFA.  ANESTHESIA:  General endotracheal.  DESCRIPTION OF PROCEDURE:  This 65 year old male was taken to room 6 and given general anesthesia.  The abdomen was prepped with PCMX and draped sterilely.  A Foley catheter was inserted.  I made a lower midline incision and entered the abdomen without difficulty.  I immediately encountered tremendous amount of bilious fluid, some with flocculation. Upon exposing the bowel, I found basically all of the small intestine was coated with exudative material consistent with a chronic perforation with exudative peritonitis.  After running his bowel, we found a pin hole in the mid ileum, which I ended up oversewing with a figure-of- eight of Vicryl and then with imbricating this with Lembert sutures of 3- 0 silk.  I used pulsatile lavage to irrigate the small bowel.  In the meantime, I tracked back and found a single dense adhesion pinning the bowel down and kinking it off rendering him having a near total obstruction, and I lysed that adhesion and  that freed up the bowel.  I was able to free up the entire bowel, and I ran it from the terminal ileum back to the ligament of Treitz.  Once everything, we looked at the bowel, and it appeared viable.  He does have somewhat of a mobile cecum, but in light of all the peritonitis, I did not feel any further resective issues would be wise at this time.  He did seem to have good closure of this one pencil hole in his small intestine.  I went ahead and following complete irrigation, debridement, and enterolysis closed him with first the peritoneum with running 2-0 Vicryl.  Then, I closed him with interrupted #1 Novafils, and 2 retention sutures were placed. The wound was packed open.  The patient was taken directly from the OR to the ICU postoperatively.  FINAL DIAGNOSIS:  Perforation of the small intestine with peritonitis status post radical peritoneal debridement, enterolysis.     Isabel Caprice Hassell Done, MD     MBM/MEDQ  D:  12/01/2015  T:  12/02/2015  Job:  [id number]

## 2015-12-02 NOTE — Progress Notes (Signed)
PARENTERAL NUTRITION CONSULT NOTE - FOLLOW-UP  Pharmacy Consult for TPN Indication: Bowel obstruction  Allergies  Allergen Reactions  . Zetia [Ezetimibe] Anaphylaxis and Swelling    Tongue and throat   . Atorvastatin Other (See Comments)    Malaise & muscle weakness  . Penicillins Other (See Comments)    Unknown  . Pravastatin Sodium     Pravastatin 40 mg qday and 40 mg q M/W/F caused muscle aches    Patient Measurements: Height: 6\' 2"  (188 cm) Weight: 153 lb 3.5 oz (69.5 kg) IBW/kg (Calculated) : 82.2 Usual Weight: 137-140 lbs  Vital Signs: Temp: 97.6 F (36.4 C) (01/07 0800) Temp Source: Axillary (01/07 0800) BP: 106/70 mmHg (01/07 0900) Pulse Rate: 97 (01/07 0900) Intake/Output from previous day: 01/06 0701 - 01/07 0700 In: 6653.8 [I.V.:3603.8; IV Piggyback:1000; TPN:2050] Out: 1055 [Urine:475; Emesis/NG output:580] Intake/Output from this shift: Total I/O In: 200 [I.V.:125; TPN:75] Out: 35 [Urine:35]  Labs:  Recent Labs  11/30/15 0415 12/01/15 0315 12/02/15 0508  WBC 1.4* 3.3* 5.7  HGB 12.9* 16.1 12.4*  HCT 39.8 48.0 37.8*  PLT PLATELET CLUMPS NOTED ON SMEAR, UNABLE TO ESTIMATE PLATELET CLUMPS NOTED ON SMEAR, UNABLE TO ESTIMATE 123*     Recent Labs  11/30/15 0415 12/01/15 0315 12/02/15 0508 12/02/15 0624  NA 140 141 132* 142  K 3.7 3.8 5.5* 4.0  CL 103 108 105 111  CO2 26 23 22 24   GLUCOSE 214* 167* 719* 169*  BUN 15 16 29* 31*  CREATININE 0.60* 1.07 1.62* 1.66*  CALCIUM 8.9 8.8* 7.7* 7.8*  MG 1.5* 2.0 2.2  --   PHOS 2.5 2.3* 6.3*  --   PROT 6.0*  --   --   --   ALBUMIN 3.2*  --   --   --   AST 21  --   --   --   ALT 18  --   --   --   ALKPHOS 57  --   --   --   BILITOT 0.9  --   --   --    Estimated Creatinine Clearance: 44.8 mL/min (by C-G formula based on Cr of 1.66).    Recent Labs  12/02/15 0011 12/02/15 0405 12/02/15 0801  GLUCAP 273* 171* 145*    Medical History: Past Medical History  Diagnosis Date  . Hyperlipidemia    . Hypertension   . Leg pain   . Peripheral vascular disease ([place])     s/p prior Ao-Bifem BPG  . 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 Coronary artery disease     a. NSTEMI 7/14=> LHC 06/07/13: Proximal LAD 30%, ostial D1 30%, AV circumflex 80%, then occluded, ostial OM2 occluded, left to left collaterals, mid RCA 95%, inferior HK, EF 60%. PCI: Promus DES to the mid RCA.  No intervention was recommended for the CTO circumflex.  . Ischemic cardiomyopathy     a. Echocardiogram 06/06/13: EF 35-40%, inferior AK, mild MR.  Marland Kitchen Shortness of breath     "can come on at anytime lately; before that it was just w/exertion" (11/10/2013)  . Type II diabetes mellitus ([place])   . [place] mal seizure [facility])     "last one was ~ 3 yr ago; controlled w/daily RX" (11/10/2013)  . Arthritis     "neck, back, legs" (11/10/2013)  . Kidney stones     "when I was younger; passed w/o treatment" (11/10/2013)  . Myocardial  infarction ([place])   . Diabetic neuropathy ([place])   . H. pylori infection   . Wears glasses     Medications:  Infusions:  . Marland KitchenTPN (CLINIMIX-E) Adult 65 mL/hr at 12/01/15 1724   And  . fat emulsion 240 mL (12/01/15 1724)  . 0.9 % NaCl with KCl 20 mEq / L 125 mL/hr at 12/01/15 1503    Insulin Requirements in the past 24 hours: 24 units yesterday  Current Nutrition: NPO  IVF: NS KCl 20 meq/L @ 125 ml/hr  Central access: PICC placed 1/3 TPN start date: 1/3  ASSESSMENT                                                                                                          HPI: 56 yoM presented to ED on 12/27 with abdominal pain.  PMH includes HTN, HLD, PVD - s/p fem bypass, CAD s/p NSTEMI 2014, cardiomyopathy, DM, diabetic peripheral neuropathy, seizures, and chronic pain on chronic opioids.  Surgical history significant for open AAA repair.  CT shows partial SBO, likely adhesions.  Patient refusing surgery.  Plan for conservative management with  TPN, bowel rest.   Significant events:  1/1: TPN per pharmacy ordered - unable to start without central access.  1/3: PICC line placed; TPN started 1/5: Acute encephalopathy with fevers (102F) overnight- sepsis protocol initiated.  Broad-spectrum abx started.  1/6: Consented to laparotomy w/ findings of perforated viscus and peritonitis. Txfer to ICU  Today 12/02/2015:   Glucose control worse yesterday perioperatively though appears improved this AM (Hx DM on glipizide PTA)  Electrolytes - Phos now elevated; Corr Ca borderline low; all other lytes wnl.  Renal - new AKI.  UOP poor; ordered copious IVF postop  LFTs - AST/ALT and Tbili wnl  TGs - 124 (1/2)  Prealbumin - 3.7 (1/2)  NG output slowing 1700 ml yesterday > 580 ml today  NUTRITIONAL GOALS                                                                                             RD recs: 1600-1800 Kcal/day, 65-80 g protein/day, > 2 L fluid/day  Clinimix E 5/15 at a goal rate of 65 ml/hr + 20% fat emulsion at 10 ml/hr to provide: 78 g/day protein, 1588 Kcal/day.  PLAN  Continue Clinimix 5/15 to 65 ml/hr + 20% fat emulsion at 10 ml/hr (goal rate) but will remove lytes with elevated phos and K in IVF  Continue NS + 20 K at 125 ml/hr per Surgery  Standard MVI, MTE in TPN bag.    Continue q6h CBGs with moderate correction insulin.  Continue 10 units insulin in TPN  Recheck Bmet, Phos, Mg with AM labs.  TPN lab panels on Mondays & Thursdays.    Reuel Boom, PharmD, BCPS Pager: [phone] 12/02/2015, 10:32 AM

## 2015-12-02 NOTE — Progress Notes (Signed)
CRITICAL VALUE ALERT  Critical value received:  Vanc trough 29  Date of notification:  12/02/2015  Time of notification:  [id number]  Critical value read back:Yes.    Nurse who received alert:  Lacinda Axon, RN  MD notified (1st page):  Elink  Time of first page:  17  MD notified (2nd page):  Time of second page:  Responding MD:  Pharmacy notified   Time MD responded: [phone]

## 2015-12-02 NOTE — Progress Notes (Signed)
Called E-link and notified physician of patient's low BP's reading from arterial line as well as automatic cuff and notified of low urinary output of 175 cc. New orders were written.

## 2015-12-02 NOTE — Progress Notes (Signed)
PULMONARY / CRITICAL CARE MEDICINE   Name: [name] [name] MRN: [id number] DOB: 01-28-1951    ADMISSION DATE:  11/21/2015 CONSULTATION DATE:  1/6  REFERRING MD:  Hassell Done   CHIEF COMPLAINT:    HISTORY OF PRESENT ILLNESS:   65 year old aam who was admitted on 12/27 w/ abd pain found to be 2/2 SBO on CT scan. Initially managed conservatively, but then on 1/1 was offered surgery given no significant improvement. He refused surgery for several days. His hospital course was complicated by fever spike and delirium on 1/5. At that time has abd was more distended. He had an EEG which was did not show seizures. He finally agreed to surgery on 1/6 d/t no resolution of abd pain. He went for exploratory lap which was notable for significant peritoneal contamination. He is s/p exploration, washout and repair of perforated viscous. PCCM was asked to assess post op and assist w/ post op critical care support.   SUBJECTIVE:  Self extubated On NRB, confused needs constant re-orientation C/o pain UO low  VITAL SIGNS: BP 101/70 mmHg  Pulse 98  Temp(Src) 97.6 F (36.4 C) (Axillary)  Resp 26  Ht 6\' 2"  (1.88 m)  Wt 69.5 kg (153 lb 3.5 oz)  BMI 19.66 kg/m2  SpO2 97%  HEMODYNAMICS:    VENTILATOR SETTINGS: Vent Mode:  [-] PRVC FiO2 (%):  [40 %-100 %] 55 % Set Rate:  [14 bmp] 14 bmp Vt Set:  [650 mL] 650 mL PEEP:  [5 cmH20] 5 cmH20 Plateau Pressure:  [18 cmH20-20 cmH20] 20 cmH20  INTAKE / OUTPUT: I/O last 3 completed shifts: In: 8754.7 [I.V.:3843.8; IV Piggyback:1900] Out: 33 [Urine:900; Emesis/NG output:1530]  PHYSICAL EXAMINATION: General:  Chronically ill appearing 65 year old AAM.  Neuro:  Awake, interactive, confused, oriented to name & palce Generalized weakness  HEENT:  NCAT, NRB Cardiovascular:  Tachy rrr; no MRG Lungs:  Clear, equal rise on vent. Crackles bases  Abdomen:  Distended. Mid abd dressing CD&I Musculoskeletal:  Equal st and bulk  Skin:  Dry and intact. LLE non  healing ulcer dressing   LABS:  BMET  Recent Labs Lab 12/01/15 0315 12/02/15 0508 12/02/15 0624  NA 141 132* 142  K 3.8 5.5* 4.0  CL 108 105 111  CO2 23 22 24   BUN 16 29* 31*  CREATININE 1.07 1.62* 1.66*  GLUCOSE 167* 719* 169*    Electrolytes  Recent Labs Lab 11/30/15 0415 12/01/15 0315 12/02/15 0508 12/02/15 0624  CALCIUM 8.9 8.8* 7.7* 7.8*  MG 1.5* 2.0 2.2  --   PHOS 2.5 2.3* 6.3*  --     CBC  Recent Labs Lab 11/30/15 0415 12/01/15 0315 12/02/15 0508  WBC 1.4* 3.3* 5.7  HGB 12.9* 16.1 12.4*  HCT 39.8 48.0 37.8*  PLT PLATELET CLUMPS NOTED ON SMEAR, UNABLE TO ESTIMATE PLATELET CLUMPS NOTED ON SMEAR, UNABLE TO ESTIMATE 123*    Coag's No results for input(s): APTT, INR in the last 168 hours.  Sepsis Markers  Recent Labs Lab 11/30/15 0645 11/30/15 1020 11/30/15 1300  LATICACIDVEN 2.3* 1.8 1.8    ABG  Recent Labs Lab 12/01/15 1527 12/02/15 0115  PHART 7.330* 7.311*  PCO2ART 39.1 40.4  PO2ART 209* 81.7    Liver Enzymes  Recent Labs Lab 11/28/15 0557 11/29/15 0452 11/30/15 0415  AST 21 20 21   ALT 20 18 18   ALKPHOS 55 58 57  BILITOT 1.0 1.1 0.9  ALBUMIN 3.2* 3.3* 3.2*    Cardiac Enzymes No results for input(s):  TROPONINI, PROBNP in the last 168 hours.  Glucose  Recent Labs Lab 12/01/15 0041 12/01/15 0614 12/01/15 1717 12/02/15 0011 12/02/15 0405 12/02/15 0801  GLUCAP 220* 197* 283* 273* 171* 145*    Imaging Dg Chest Port 1 View  12/01/2015  CLINICAL DATA:  Endotracheal tube placement ; sepsis, encephalopathy, coronary artery disease, current smoker. EXAM: PORTABLE CHEST 1 VIEW COMPARISON:  Portable chest x-ray of November 30, 2015 FINDINGS: There is been interval intubation of the trachea. The tip of the tube lies approximately 6.4 cm above the carina. There bibasilar infiltrates. The heart is normal in size. The pulmonary vascularity is not engorged. The esophagogastric tube tip projects below the inferior margin of the  image. The right-sided PICC line tip projects over the proximal SVC. IMPRESSION: 1. Interval intubation of the trachea with reasonable positioning of the endotracheal tube. The other support tubes are in reasonable position. 2. Bibasilar alveolar opacities consistent with pneumonia. Electronically Signed   By: David  Martinique M.D.   On: 12/01/2015 16:14     STUDIES:  1/5 EEG: no seizures. C/w metabolic changes/meds  CULTURES: BCX2 1/5>>>ng  ANTIBIOTICS: Cefepime 1/5>>> Flagyl 1/5>>> vanc 1/4>>> Imipenem 1/4>>>1/5 (stopped d/t concern for seizure)  SIGNIFICANT EVENTS:   LINES/TUBES: RUE PICC 1/3>>> OETT 1/6>>>1/6   DISCUSSION: 65 year old male admitted 12/27 for SBO. Initially managed medically but when sxs not improved surgery was recommended on 1/1; the pt refused @ that time. He presents to the ICU on full vent support on 1/6 after abd exploration where he was found to have perforated viscous and peritonitis. He underwent washout & repair.   ASSESSMENT / PLAN:  PULMONARY A: Ventilator dependence s/p exploratory laparotomy -resolved H/o nicotine abuse  P:  Ct NRB, dial down FIO2   CARDIOVASCULAR A:  Severe sepsis  H/o cad S/p left fem pop bypass w/ non-healing ulcer  P:  Resume ASA when ok w/surg Tele Wound care to LLE   RENAL A:   AKI - oliguric P:   Ct IVFs @ 125/h Renal dose meds   GASTROINTESTINAL A:   SBO w/ perforated viscous and resultant peritonitis  Severe Protein calorie malnutrition  P:   Bowel rest Cont TPN Wound care per surgery  PRN Fentanyl   HEMATOLOGIC A:   Neutropenia - resolved Dilutional anemia w/expected post-op blood loss  P:  Trend CBC [place] LMWH   INFECTIOUS A:   Peritonitis in setting of perforation d/t SBO Severe sepsis  P:   abx per above  F/u cultures   ENDOCRINE A:   Type II DM  P:   ssi   NEUROLOGIC A:   Acute Encephalopathy in setting of sepsis  H/o seizure d/o P:   Off AEDs X 2 mo before admit-->EEG  1/5 negative.  Watch closely for evidence of seizures.  If seizure activity would load Keppra   FAMILY  - Updates: none available  - Inter-disciplinary family meet or Palliative Care meeting due by:  1/13  Summary - Self extubated, expect prolonged course with peritonitis, delerium & oliguria are present concerns - hope to improve  The patient is critically ill with multiple organ systems failure and requires high complexity decision making for assessment and support, frequent evaluation and titration of therapies, application of advanced monitoring technologies and extensive interpretation of multiple databases. Critical Care Time devoted to patient care services described in this note independent of APP time is 35 minutes.   Rigoberto Noel MD   12/02/2015, 10:42 AM

## 2015-12-02 NOTE — Progress Notes (Signed)
Pharmacy - Abx dosing  Assessment:    Please see note from earlier today for full details.  Briefly, 65 y.o. male on vancomycin + cefepime + Flagyl for peritonitis.  Vanc dose recently increased w/ borderline low trough, but SCr increased this morning.  Stopped vanc and checked level at time next dose would have been due   Vanc level 29; high as expected with new AKI.  Plan:   Continue to hold vancomycin  Recheck random level in 24 hr  Recheck SCr tomorrow AM  Reuel Boom, PharmD, BCPS Pager: [phone] 12/02/2015, 5:11 PM

## 2015-12-02 NOTE — Progress Notes (Signed)
Was notified by charge RN of patients am lab results, lab was called to request material for lab re-draw. Labs were re-drawn and oncoming RN was notified that new labs have been drawn.

## 2015-12-02 NOTE — Progress Notes (Signed)
Patient had received prn fentanyl IV push and prn versed IV push throughout the shift for agitation. He had soft mitts on to prevent him from attempting to pull lines. RT was called due to ventilator alarm due to patient biting on oropharyngeal tube. Pt.became agitated again and it was too soon to give prn IV push medications. Called E-link to notify physician for restraints order was given, however patient pulled out oropharyngeal tube before restraint could be applied. RT was present in the room. Patient was maintaining oxygen saturations above 90% no sign so respiratory distress. Called E-link to notify physician, orders continued for soft wrist restraints to keep patient from pulling out PICC line and urinary  cath.

## 2015-12-02 NOTE — Progress Notes (Signed)
ANTIBIOTIC CONSULT NOTE - INITIAL  Pharmacy Consult for Vancomycin Indication: Sepsis  Allergies  Allergen Reactions  . Zetia [Ezetimibe] Anaphylaxis and Swelling    Tongue and throat   . Atorvastatin Other (See Comments)    Malaise & muscle weakness  . Penicillins Other (See Comments)    Unknown  . Pravastatin Sodium     Pravastatin 40 mg qday and 40 mg q M/W/F caused muscle aches    Patient Measurements: Height: 6\' 2"  (188 cm) Weight: 151 lb 0.2 oz (68.5 kg) IBW/kg (Calculated) : 82.2 Adjusted Body Weight:   Vital Signs: Temp: 97.4 F (36.3 C) (01/07 0425) Temp Source: Oral (01/07 0425) BP: 107/78 mmHg (01/06 2025) Pulse Rate: 115 (01/06 2000) Intake/Output from previous day: 01/06 0701 - 01/07 0700 In: 4503.8 [I.V.:2603.8; IV Piggyback:750; TPN:1150] Out: 380 [Urine:200; Emesis/NG output:180] Intake/Output from this shift: Total I/O In: 560 [I.V.:510; IV Piggyback:50] Out: -   Labs:  Recent Labs  11/30/15 0415 12/01/15 0315 12/02/15 0508  WBC 1.4* 3.3* 5.7  HGB 12.9* 16.1 12.4*  PLT PLATELET CLUMPS NOTED ON SMEAR, UNABLE TO ESTIMATE PLATELET CLUMPS NOTED ON SMEAR, UNABLE TO ESTIMATE 123*  CREATININE 0.60* 1.07  --    Estimated Creatinine Clearance: 68.5 mL/min (by C-G formula based on Cr of 1.07).  Recent Labs  12/01/15 2300  [place] 14     Microbiology: Recent Results (from the past 720 hour(s))  Culture, Urine     Status: None   Collection Time: 11/30/15  6:21 AM  Result Value Ref Range Status   Specimen Description URINE, CLEAN CATCH  Final   Special Requests NONE  Final   Culture   Final    NO GROWTH 1 DAY Performed at [facility]    Report Status 12/01/2015 FINAL  Final  Culture, blood (Routine X 2) w Reflex to ID Panel     Status: None (Preliminary result)   Collection Time: 11/30/15  6:31 AM  Result Value Ref Range Status   Specimen Description BLOOD LEFT ARM  Final   Special Requests IN PEDIATRIC BOTTLE 2CC  Final   Culture   Final    NO GROWTH 1 DAY Performed at [facility]    Report Status PENDING  Incomplete  Culture, blood (Routine X 2) w Reflex to ID Panel     Status: None (Preliminary result)   Collection Time: 11/30/15  6:45 AM  Result Value Ref Range Status   Specimen Description BLOOD RIGHT PICC LINE  Final   Special Requests BOTTLES DRAWN AEROBIC AND ANAEROBIC 10CC  Final   Culture   Final    NO GROWTH 1 DAY Performed at [facility]    Report Status PENDING  Incomplete    Medical History: Past Medical History  Diagnosis Date  . Hyperlipidemia   . Hypertension   . Leg pain   . Peripheral vascular disease ([place])     s/p prior Ao-Bifem BPG  . 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 Coronary artery disease     a. NSTEMI 7/14=> LHC 06/07/13: Proximal LAD 30%, ostial D1 30%, AV circumflex 80%, then occluded, ostial OM2 occluded, left to left collaterals, mid RCA 95%, inferior HK, EF 60%. PCI: Promus DES to the mid RCA.  No intervention was recommended for the CTO circumflex.  . Ischemic cardiomyopathy     a. Echocardiogram 06/06/13: EF 35-40%, inferior AK, mild MR.  Marland Kitchen Shortness  of breath     "can come on at anytime lately; before that it was just w/exertion" (11/10/2013)  . Type II diabetes mellitus ([place])   . [place] mal seizure [facility])     "last one was ~ 3 yr ago; controlled w/daily RX" (11/10/2013)  . Arthritis     "neck, back, legs" (11/10/2013)  . Kidney stones     "when I was younger; passed w/o treatment" (11/10/2013)  . Myocardial infarction ([place])   . Diabetic neuropathy ([place])   . H. pylori infection   . Wears glasses     Medications:  Anti-infectives    Start     Dose/Rate Route Frequency Ordered Stop   12/02/15 0600  vancomycin (VANCOCIN) IVPB 1000 mg/200 mL premix     1,000 mg 200 mL/hr over 60 Minutes Intravenous 3 times per day 12/02/15 0547     12/01/15 2200  ceFEPIme (MAXIPIME) 1 g in dextrose 5 %  50 mL IVPB     1 g 100 mL/hr over 30 Minutes Intravenous 3 times per day 12/01/15 1455     11/30/15 1400  ceFEPIme (MAXIPIME) 1 g in dextrose 5 % 50 mL IVPB  Status:  Discontinued     1 g 100 mL/hr over 30 Minutes Intravenous 3 times per day 11/30/15 0913 12/01/15 1455   11/30/15 1000  metroNIDAZOLE (FLAGYL) IVPB 500 mg     500 mg 100 mL/hr over 60 Minutes Intravenous Every 8 hours 11/30/15 0858     11/30/15 0645  vancomycin (VANCOCIN) IVPB 750 mg/150 ml premix  Status:  Discontinued     750 mg 150 mL/hr over 60 Minutes Intravenous 3 times per day 11/30/15 0630 12/02/15 0547   11/30/15 0645  imipenem-cilastatin (PRIMAXIN) 500 mg in sodium chloride 0.9 % 100 mL IVPB  Status:  Discontinued     500 mg 200 mL/hr over 30 Minutes Intravenous 3 times per day 11/30/15 0630 11/30/15 0859     Assessment: Patient with low vancomycin level with vancomycin 750mg  iv q8hr dosing.  Goal of Therapy:  Vancomycin trough level 15-20 mcg/ml  Plan:  Measure antibiotic drug levels at steady state Follow up culture results Change vancomycin to 1gm iv q8hr  Tyler Deis, Shea Stakes [place] 12/02/2015,5:49 AM

## 2015-12-02 NOTE — Progress Notes (Signed)
Pt becoming increasingly more agitated and delirious- yelling out, attempting to get out of bed, and rambling. Elink MD notified. PRN orders for haldol received. Will continue to monitor.

## 2015-12-02 NOTE — Progress Notes (Signed)
[address] [place], [place] [address] Phone: [phone] FAX: [phone]   TAVION SENKBEIL [id number] 04-Jul-1951   Assessment  GUARDED s/p ex lap & repair of SB perforation in setting of SB obstruction  Problem List:   Principal Problem:   Small bowel obstruction & perforation s/p OR exploration & repair 12/01/2015 Active Problems:   Tobacco abuse   Type 2 diabetes mellitus ([facility])   CAD (coronary artery disease)   SBO (small bowel obstruction) ([facility])   Proteinuria   Encephalopathy   Sepsis ([facility])   1 Day Post-Op  11/21/2015 - 12/01/2015  Procedure(s):  EXPLORATORY LAPAROTOMY LYSIS OF ADHESIONS RADICAL PERITONEAL DEBRIDEMENT CLOSURE OF SMALL BOWEL PERFORATION     Plan:  -IVF -NGT/NPO -see if he will fly off of vent s/p self extubation - inc oxygen requirements -TNA for prolonged ileus & severe malnutrition -start dressing changes Monday  -VTE prophylaxis- SCDs, etc Peripheral vascular disease with nonhealing left foot ulcer  Status post left femoral to below-knee popliteal bypass on 07/2015 by Dr. early. He also had left great toe debridement on 11/14/2015 by Dr. Mariea Clonts. Continue wound care. -mobilize as tolerated to help recovery  Adin Hector, M.D., F.A.C.S. Gastrointestinal and Minimally Invasive Surgery [facility] [address] [place], [place] [address] [phone] Main / Paging   12/02/2015  Subjective:  Deteriated & required ex lap Self extubated Was on Opp but inc oxygen requirements this AM RN in room UOP fair BP soft w narcotics  Objective:  Vital signs:  Filed Vitals:   12/02/15 0800 12/02/15 0900 12/02/15 1000 12/02/15 1008  BP:  106/70  101/70  Pulse: 97 97 94 98  Temp: 97.6 F (36.4 C)     TempSrc: Axillary     Resp: 14 30 24 26   Height:      Weight:      SpO2: 98% 97% 98% 97%    Last BM Date: 11/30/15  Intake/Output    Yesterday:  01/06 0701 - 01/07 0700 In: 6653.8 [I.V.:3603.8; IV Piggyback:1000; TPN:2050] Out: 1055 [Urine:475; Emesis/NG output:580] This shift:  Total I/O In: 52 [I.V.:375; IV Piggyback:200; TPN:225] Out: 94 [Urine:65]  Bowel function:  Flatus: n  BM: n  Drain: thick bile  Physical Exam:  General: Pt awake/alert/oriented x4 in mild acute distress Eyes: PERRL, normal EOM.  Sclera clear.  No icterus Neuro: CN II-XII intact w/o focal sensory/motor deficits. Lymph: No head/neck/groin lymphadenopathy Psych:  No delerium/psychosis/paranoia HENT: Normocephalic, Mucus membranes moist.  No thrush Neck: Supple, No tracheal deviation Chest: No chest wall pain w good excursion CV:  Pulses intact.  Regular rhythm MS: Normal AROM mjr joints.  No obvious deformity Abdomen: Somewhat firm.  Mildly distended.   Dressing c/d/i.  Mildly tender at incisions only.  No evidence of peritonitis.  No incarcerated hernias. Ext:  SCDs BLE.  No mjr edema.  No cyanosis.  Stable foot ulcer Skin: No petechiae / purpura  Results:   Labs: Results for orders placed or performed during the hospital encounter of 11/21/15 (from the past 48 hour(s))  Glucose, capillary     Status: Abnormal   Collection Time: 11/30/15 12:40 PM  Result Value Ref Range   Glucose-Capillary 160 (H) 65 - 99 mg/dL  Lactic acid, plasma     Status: None   Collection Time: 11/30/15  1:00 PM  Result Value Ref Range   Lactic Acid, Venous 1.8 0.5 - 2.0 mmol/L  Glucose,  capillary     Status: Abnormal   Collection Time: 11/30/15  6:14 PM  Result Value Ref Range   Glucose-Capillary 159 (H) 65 - 99 mg/dL  Glucose, capillary     Status: Abnormal   Collection Time: 12/01/15 12:41 AM  Result Value Ref Range   Glucose-Capillary 220 (H) 65 - 99 mg/dL  Magnesium     Status: None   Collection Time: 12/01/15  3:15 AM  Result Value Ref Range   Magnesium 2.0 1.7 - 2.4 mg/dL  Phosphorus     Status: Abnormal   Collection Time: 12/01/15   3:15 AM  Result Value Ref Range   Phosphorus 2.3 (L) 2.5 - 4.6 mg/dL  CBC with Differential/Platelet     Status: Abnormal   Collection Time: 12/01/15  3:15 AM  Result Value Ref Range   WBC 3.3 (L) 4.0 - 10.5 K/uL   RBC 5.46 4.22 - 5.81 MIL/uL   Hemoglobin 16.1 13.0 - 17.0 g/dL   HCT 48.0 39.0 - 52.0 %   MCV 87.9 78.0 - 100.0 fL   MCH 29.5 26.0 - 34.0 pg   MCHC 33.5 30.0 - 36.0 g/dL   RDW 14.6 11.5 - 15.5 %   Platelets PLATELET CLUMPS NOTED ON SMEAR, UNABLE TO ESTIMATE 150 - 400 K/uL   Neutrophils Relative % 78 %   Lymphocytes Relative 12 %   Monocytes Relative 8 %   Eosinophils Relative 1 %   Basophils Relative 1 %   Neutro Abs 2.6 1.7 - 7.7 K/uL   Lymphs Abs 0.4 (L) 0.7 - 4.0 K/uL   Monocytes Absolute 0.3 0.1 - 1.0 K/uL   Eosinophils Absolute 0.0 0.0 - 0.7 K/uL   Basophils Absolute 0.0 0.0 - 0.1 K/uL   Smear Review MORPHOLOGY UNREMARKABLE   Basic metabolic panel     Status: Abnormal   Collection Time: 12/01/15  3:15 AM  Result Value Ref Range   Sodium 141 135 - 145 mmol/L   Potassium 3.8 3.5 - 5.1 mmol/L   Chloride 108 101 - 111 mmol/L   CO2 23 22 - 32 mmol/L   Glucose, Bld 167 (H) 65 - 99 mg/dL   BUN 16 6 - 20 mg/dL   Creatinine, Ser 1.07 0.61 - 1.24 mg/dL   Calcium 8.8 (L) 8.9 - 10.3 mg/dL   GFR calc non Af Amer >60 >60 mL/min   GFR calc Af Amer >60 >60 mL/min    Comment: (NOTE) The eGFR has been calculated using the CKD EPI equation. This calculation has not been validated in all clinical situations. eGFR's persistently <60 mL/min signify possible Chronic Kidney Disease.    Anion gap 10 5 - 15  Ammonia     Status: Abnormal   Collection Time: 12/01/15  3:15 AM  Result Value Ref Range   Ammonia 38 (H) 9 - 35 umol/L  Glucose, capillary     Status: Abnormal   Collection Time: 12/01/15  6:14 AM  Result Value Ref Range   Glucose-Capillary 197 (H) 65 - 99 mg/dL  Blood gas, arterial     Status: Abnormal   Collection Time: 12/01/15  3:27 PM  Result Value Ref Range    FIO2 1.00    Delivery systems VENTILATOR    Mode PRESSURE REGULATED VOLUME CONTROL    VT 650 mL   LHR 14 resp/min   Peep/cpap 5.0 cm H20   pH, Arterial 7.330 (L) 7.350 - 7.450   pCO2 arterial 39.1 35.0 - 45.0 mmHg   pO2,  Arterial 209 (H) 80.0 - 100.0 mmHg   Bicarbonate 20.0 20.0 - 24.0 mEq/L   TCO2 18.0 0 - 100 mmol/L   Acid-base deficit 5.0 (H) 0.0 - 2.0 mmol/L   O2 Saturation 98.8 %   Patient temperature 98.6    Collection site A-LINE    Drawn by COLLECTED BY RT    Sample type ARTERIAL DRAW    Allens test (pass/fail) PASS PASS  Glucose, capillary     Status: Abnormal   Collection Time: 12/01/15  5:17 PM  Result Value Ref Range   Glucose-Capillary 283 (H) 65 - 99 mg/dL   Comment 1 Notify RN    Comment 2 Document in Chart   Vancomycin, trough     Status: None   Collection Time: 12/01/15 11:00 PM  Result Value Ref Range   Vancomycin Tr 14 10.0 - 20.0 ug/mL  Glucose, capillary     Status: Abnormal   Collection Time: 12/02/15 12:11 AM  Result Value Ref Range   Glucose-Capillary 273 (H) 65 - 99 mg/dL  Blood gas, arterial     Status: Abnormal   Collection Time: 12/02/15  1:15 AM  Result Value Ref Range   FIO2 0.55    Delivery systems VENTURI MASK    pH, Arterial 7.311 (L) 7.350 - 7.450   pCO2 arterial 40.4 35.0 - 45.0 mmHg   pO2, Arterial 81.7 80.0 - 100.0 mmHg   Bicarbonate 19.8 (L) 20.0 - 24.0 mEq/L   TCO2 17.8 0 - 100 mmol/L   Acid-base deficit 5.6 (H) 0.0 - 2.0 mmol/L   O2 Saturation 93.6 %   Patient temperature 98.6    Collection site A-LINE    Drawn by [id number]    Sample type ARTERIAL DRAW   Glucose, capillary     Status: Abnormal   Collection Time: 12/02/15  4:05 AM  Result Value Ref Range   Glucose-Capillary 171 (H) 65 - 99 mg/dL  Basic metabolic panel     Status: Abnormal   Collection Time: 12/02/15  5:08 AM  Result Value Ref Range   Sodium 132 (L) 135 - 145 mmol/L    Comment: DELTA CHECK NOTED REPEATED TO VERIFY    Potassium 5.5 (H) 3.5 - 5.1 mmol/L     Comment: DELTA CHECK NOTED REPEATED TO VERIFY SLIGHT HEMOLYSIS    Chloride 105 101 - 111 mmol/L   CO2 22 22 - 32 mmol/L   Glucose, Bld 719 (HH) 65 - 99 mg/dL    Comment: CRITICAL RESULT CALLED TO, READ BACK BY AND VERIFIED WITHLoletha Grayer [facility] RN [id number] 12/02/15 A NAVARRO MIGHT RECOLLECT    BUN 29 (H) 6 - 20 mg/dL   Creatinine, Ser 1.62 (H) 0.61 - 1.24 mg/dL   Calcium 7.7 (L) 8.9 - 10.3 mg/dL   GFR calc non Af Amer 44 (L) >60 mL/min   GFR calc Af Amer 51 (L) >60 mL/min    Comment: (NOTE) The eGFR has been calculated using the CKD EPI equation. This calculation has not been validated in all clinical situations. eGFR's persistently <60 mL/min signify possible Chronic Kidney Disease.    Anion gap 5 5 - 15  Magnesium     Status: None   Collection Time: 12/02/15  5:08 AM  Result Value Ref Range   Magnesium 2.2 1.7 - 2.4 mg/dL  Phosphorus     Status: Abnormal   Collection Time: 12/02/15  5:08 AM  Result Value Ref Range   Phosphorus 6.3 (H) 2.5 - 4.6 mg/dL  CBC  Status: Abnormal   Collection Time: 12/02/15  5:08 AM  Result Value Ref Range   WBC 5.7 4.0 - 10.5 K/uL   RBC 4.12 (L) 4.22 - 5.81 MIL/uL   Hemoglobin 12.4 (L) 13.0 - 17.0 g/dL    Comment: RESULT REPEATED AND VERIFIED DELTA CHECK NOTED    HCT 37.8 (L) 39.0 - 52.0 %   MCV 91.7 78.0 - 100.0 fL   MCH 30.1 26.0 - 34.0 pg   MCHC 32.8 30.0 - 36.0 g/dL   RDW 15.5 11.5 - 15.5 %   Platelets 123 (L) 150 - 400 K/uL  Basic metabolic panel     Status: Abnormal   Collection Time: 12/02/15  6:24 AM  Result Value Ref Range   Sodium 142 135 - 145 mmol/L    Comment: DELTA CHECK NOTED   Potassium 4.0 3.5 - 5.1 mmol/L   Chloride 111 101 - 111 mmol/L   CO2 24 22 - 32 mmol/L   Glucose, Bld 169 (H) 65 - 99 mg/dL   BUN 31 (H) 6 - 20 mg/dL   Creatinine, Ser 1.66 (H) 0.61 - 1.24 mg/dL   Calcium 7.8 (L) 8.9 - 10.3 mg/dL   GFR calc non Af Amer 42 (L) >60 mL/min   GFR calc Af Amer 49 (L) >60 mL/min    Comment: (NOTE) The eGFR has been  calculated using the CKD EPI equation. This calculation has not been validated in all clinical situations. eGFR's persistently <60 mL/min signify possible Chronic Kidney Disease.    Anion gap 7 5 - 15  Glucose, capillary     Status: Abnormal   Collection Time: 12/02/15  8:01 AM  Result Value Ref Range   Glucose-Capillary 145 (H) 65 - 99 mg/dL    Imaging / Studies: Dg Chest Port 1 View  12/01/2015  CLINICAL DATA:  Endotracheal tube placement ; sepsis, encephalopathy, coronary artery disease, current smoker. EXAM: PORTABLE CHEST 1 VIEW COMPARISON:  Portable chest x-ray of November 30, 2015 FINDINGS: There is been interval intubation of the trachea. The tip of the tube lies approximately 6.4 cm above the carina. There bibasilar infiltrates. The heart is normal in size. The pulmonary vascularity is not engorged. The esophagogastric tube tip projects below the inferior margin of the image. The right-sided PICC line tip projects over the proximal SVC. IMPRESSION: 1. Interval intubation of the trachea with reasonable positioning of the endotracheal tube. The other support tubes are in reasonable position. 2. Bibasilar alveolar opacities consistent with pneumonia. Electronically Signed   By: David  Martinique M.D.   On: 12/01/2015 16:14   Dg Abd 2 Views  12/01/2015  CLINICAL DATA:  Follow up small bowel obstruction. EXAM: ABDOMEN - 2 VIEW COMPARISON:  Multiple prior radiographs obtained over the last week. FINDINGS: The nasogastric tube appears unchanged, terminating in the mid abdomen. Small bowel distention in the upper abdomen is unchanged. There is some gas within the colon. There is no free intraperitoneal air on the decubitus view. Vascular stents are noted. IMPRESSION: No significant change in residual partial small bowel obstruction compared with recent priors. The degree of bowel distention is improved from the studies done last week. Electronically Signed   By: Richardean Sale M.D.   On: 12/01/2015 08:44     Medications / Allergies: per chart  Antibiotics: Anti-infectives    Start     Dose/Rate Route Frequency Ordered Stop   12/02/15 1800  ceFEPIme (MAXIPIME) 1 g in dextrose 5 % 50 mL IVPB  1 g 100 mL/hr over 30 Minutes Intravenous Every 12 hours 12/02/15 1014     12/02/15 0600  vancomycin (VANCOCIN) IVPB 1000 mg/200 mL premix  Status:  Discontinued     1,000 mg 200 mL/hr over 60 Minutes Intravenous 3 times per day 12/02/15 0547 12/02/15 0954   12/01/15 2200  ceFEPIme (MAXIPIME) 1 g in dextrose 5 % 50 mL IVPB  Status:  Discontinued     1 g 100 mL/hr over 30 Minutes Intravenous 3 times per day 12/01/15 1455 12/02/15 1014   11/30/15 1400  ceFEPIme (MAXIPIME) 1 g in dextrose 5 % 50 mL IVPB  Status:  Discontinued     1 g 100 mL/hr over 30 Minutes Intravenous 3 times per day 11/30/15 0913 12/01/15 1455   11/30/15 1000  metroNIDAZOLE (FLAGYL) IVPB 500 mg     500 mg 100 mL/hr over 60 Minutes Intravenous Every 8 hours 11/30/15 0858     11/30/15 0645  vancomycin (VANCOCIN) IVPB 750 mg/150 ml premix  Status:  Discontinued     750 mg 150 mL/hr over 60 Minutes Intravenous 3 times per day 11/30/15 0630 12/02/15 0547   11/30/15 0645  imipenem-cilastatin (PRIMAXIN) 500 mg in sodium chloride 0.9 % 100 mL IVPB  Status:  Discontinued     500 mg 200 mL/hr over 30 Minutes Intravenous 3 times per day 11/30/15 0630 11/30/15 0859        Note: Portions of this report may have been transcribed using voice recognition software. Every effort was made to ensure accuracy; however, inadvertent computerized transcription errors may be present.   Any transcriptional errors that result from this process are unintentional.     Adin Hector, M.D., F.A.C.S. Gastrointestinal and Minimally Invasive Surgery [facility] [address] [place], [place] [address] [phone] Main / Paging   12/02/2015  CARE TEAM:  PCP: Marijean Bravo, MD  Outpatient Care Team:  Patient Care Team: Marijean Bravo, MD as PCP - General (Internal Medicine) Dorna Mai, MD as Referring Physician (Family Medicine) Minda Ditto, DPM as Referring Physician (Podiatry) Rosetta Posner, MD as Consulting Physician (Vascular Surgery)  Inpatient Treatment Team: Treatment Team: Attending Provider: Rigoberto Noel, MD; Consulting Physician: Nolon Nations, MD; Technician: Jani Files, NT; Registered Nurse: Junior Marga Melnick, RN; Technician: Milagros Reap, NT; Rounding Team: Md Pccm, MD; Registered Nurse: Alfonse Spruce, RN

## 2015-12-02 NOTE — Progress Notes (Addendum)
Pharmacy Antibiotic Follow-up Note  [name] [name] is a 65 y.o. year-old male admitted on 11/21/2015.  The patient is currently on day 3 of Vancomycin + cefepime + Flagyl for peritonitis from perforated viscus.    Assessment/Plan:  Flagyl dosing per MD  Reduce cefepime to 1g IV q12 given Hx seizures and new AKI  Hold vancomycin with worsening renal function.  Will check trough at 1430 (when next dose would have been due)  Will continue to follow renal function and re-enter vancomycin orders as appropriate.  Please consider appropriateness of continued vancomycin given cultures and clinical status  Continue to monitor patient's clinical course, culture data & renal function  Temp (24hrs), Avg:97.9 F (36.6 C), Min:97.4 F (36.3 C), Max:98.8 F (37.1 C)   Recent Labs Lab 11/28/15 0557 11/29/15 0452 11/30/15 0415 12/01/15 0315 12/02/15 0508  WBC 4.8 6.6 1.4* 3.3* 5.7     Recent Labs Lab 11/29/15 0452 11/30/15 0415 12/01/15 0315 12/02/15 0508 12/02/15 0624  CREATININE 0.58* 0.60* 1.07 1.62* 1.66*   Estimated Creatinine Clearance: 44.8 mL/min (by C-G formula based on Cr of 1.66).    Allergies  Allergen Reactions  . Zetia [Ezetimibe] Anaphylaxis and Swelling    Tongue and throat   . Atorvastatin Other (See Comments)    Malaise & muscle weakness  . Penicillins Other (See Comments)    Unknown  . Pravastatin Sodium     Pravastatin 40 mg qday and 40 mg q M/W/F caused muscle aches   Today, 12/02/2015: Temp: afebrile following fever spike 1/5 AM WBC: recovered from low to wnl Renal: SCr markedly increased overnight  Antimicrobials this admission: 1/5>> vanc >> 1/5>> primaxin >>1/5 (d/c'd due to seizure hx- concern for sz activity this admit although EEG negative) 1/5>>cefepime>> 1/5>>flagyl>>  Levels/dose changes this admission: 1/6 VT sl low at 14, increased to 1000 mg q8h  Microbiology results: 1/5 Blood x2: ngtd 1/5 Urine: ngf  Thank you for allowing  pharmacy to be a part of this patient's care.  Reuel Boom, PharmD, BCPS Pager: [phone] 12/02/2015, 10:04 AM

## 2015-12-03 ENCOUNTER — Inpatient Hospital Stay (HOSPITAL_COMMUNITY): Payer: Medicaid Other

## 2015-12-03 ENCOUNTER — Encounter (HOSPITAL_COMMUNITY): Payer: Self-pay | Admitting: Surgery

## 2015-12-03 DIAGNOSIS — K650 Generalized (acute) peritonitis: Secondary | ICD-10-CM

## 2015-12-03 LAB — GLUCOSE, CAPILLARY
GLUCOSE-CAPILLARY: 124 mg/dL — AB (ref 65–99)
GLUCOSE-CAPILLARY: 136 mg/dL — AB (ref 65–99)
GLUCOSE-CAPILLARY: 113 mg/dL — AB (ref 65–99)
GLUCOSE-CAPILLARY: 155 mg/dL — AB (ref 65–99)
Glucose-Capillary: 106 mg/dL — ABNORMAL HIGH (ref 65–99)
Glucose-Capillary: 118 mg/dL — ABNORMAL HIGH (ref 65–99)

## 2015-12-03 LAB — BASIC METABOLIC PANEL
ANION GAP: 8 (ref 5–15)
BUN: 39 mg/dL — ABNORMAL HIGH (ref 6–20)
CALCIUM: 7.8 mg/dL — AB (ref 8.9–10.3)
CO2: 21 mmol/L — ABNORMAL LOW (ref 22–32)
Chloride: 112 mmol/L — ABNORMAL HIGH (ref 101–111)
Creatinine, Ser: 1.89 mg/dL — ABNORMAL HIGH (ref 0.61–1.24)
GFR calc Af Amer: 42 mL/min — ABNORMAL LOW (ref >60)
GFR, EST NON AFRICAN AMERICAN: 36 mL/min — AB (ref >60)
GLUCOSE: 154 mg/dL — AB (ref 65–99)
POTASSIUM: 3.8 mmol/L (ref 3.5–5.1)
SODIUM: 141 mmol/L (ref 135–145)

## 2015-12-03 LAB — VANCOMYCIN, RANDOM: VANCOMYCIN RM: 17 ug/mL

## 2015-12-03 LAB — PHOSPHORUS: PHOSPHORUS: 1.9 mg/dL — AB (ref 2.5–4.6)

## 2015-12-03 LAB — MAGNESIUM: MAGNESIUM: 1.6 mg/dL — AB (ref 1.7–2.4)

## 2015-12-03 MED ORDER — POTASSIUM PHOSPHATES 15 MMOLE/5ML IV SOLN
30.0000 mmol | Freq: Once | INTRAVENOUS | Status: AC
  Administered 2015-12-03: 30 mmol via INTRAVENOUS
  Filled 2015-12-03: qty 10

## 2015-12-03 MED ORDER — LORAZEPAM 2 MG/ML IJ SOLN
1.0000 mg | Freq: Four times a day (QID) | INTRAMUSCULAR | Status: AC | PRN
  Administered 2015-12-03 – 2015-12-05 (×5): 1 mg via INTRAVENOUS
  Filled 2015-12-03 (×5): qty 1

## 2015-12-03 MED ORDER — LORAZEPAM 1 MG PO TABS: 1.0000 mg | ORAL_TABLET | Freq: Four times a day (QID) | ORAL | Status: AC | PRN

## 2015-12-03 MED ORDER — VITAMIN B-1 100 MG PO TABS
100.0000 mg | ORAL_TABLET | Freq: Every day | ORAL | Status: DC
  Administered 2015-12-09 – 2015-12-30 (×21): 100 mg via ORAL
  Filled 2015-12-03 (×22): qty 1

## 2015-12-03 MED ORDER — VANCOMYCIN HCL IN DEXTROSE 750-5 MG/150ML-% IV SOLN
750.0000 mg | INTRAVENOUS | Status: DC
  Administered 2015-12-03: 750 mg via INTRAVENOUS
  Filled 2015-12-03: qty 150

## 2015-12-03 MED ORDER — THIAMINE HCL 100 MG/ML IJ SOLN
100.0000 mg | Freq: Every day | INTRAMUSCULAR | Status: DC
  Administered 2015-12-03 – 2015-12-08 (×6): 100 mg via INTRAVENOUS
  Filled 2015-12-03: qty 1
  Filled 2015-12-03 (×4): qty 2
  Filled 2015-12-03 (×2): qty 1
  Filled 2015-12-03 (×2): qty 2
  Filled 2015-12-03: qty 1

## 2015-12-03 MED ORDER — HYDROMORPHONE HCL 1 MG/ML IJ SOLN
0.5000 mg | INTRAMUSCULAR | Status: DC | PRN
  Administered 2015-12-03 (×8): 1 mg via INTRAVENOUS
  Administered 2015-12-04: 2 mg via INTRAVENOUS
  Administered 2015-12-04: 1 mg via INTRAVENOUS
  Administered 2015-12-04: 2 mg via INTRAVENOUS
  Administered 2015-12-04 (×5): 1 mg via INTRAVENOUS
  Administered 2015-12-04: 2 mg via INTRAVENOUS
  Administered 2015-12-05 – 2015-12-07 (×12): 1 mg via INTRAVENOUS
  Administered 2015-12-07: 2 mg via INTRAVENOUS
  Administered 2015-12-07 (×2): 1 mg via INTRAVENOUS
  Administered 2015-12-07: 2 mg via INTRAVENOUS
  Administered 2015-12-07: 1 mg via INTRAVENOUS
  Administered 2015-12-08 (×7): 2 mg via INTRAVENOUS
  Administered 2015-12-08: 1 mg via INTRAVENOUS
  Administered 2015-12-08 – 2015-12-11 (×11): 2 mg via INTRAVENOUS
  Administered 2015-12-11 – 2015-12-12 (×8): 1 mg via INTRAVENOUS
  Filled 2015-12-03 (×2): qty 2
  Filled 2015-12-03: qty 1
  Filled 2015-12-03: qty 2
  Filled 2015-12-03 (×3): qty 1
  Filled 2015-12-03 (×2): qty 2
  Filled 2015-12-03 (×3): qty 1
  Filled 2015-12-03 (×2): qty 2
  Filled 2015-12-03: qty 1
  Filled 2015-12-03: qty 2
  Filled 2015-12-03: qty 1
  Filled 2015-12-03: qty 2
  Filled 2015-12-03 (×2): qty 1
  Filled 2015-12-03 (×2): qty 2
  Filled 2015-12-03 (×6): qty 1
  Filled 2015-12-03: qty 2
  Filled 2015-12-03 (×2): qty 1
  Filled 2015-12-03: qty 2
  Filled 2015-12-03: qty 1
  Filled 2015-12-03 (×2): qty 2
  Filled 2015-12-03: qty 1
  Filled 2015-12-03: qty 2
  Filled 2015-12-03 (×2): qty 1
  Filled 2015-12-03: qty 2
  Filled 2015-12-03 (×3): qty 1
  Filled 2015-12-03: qty 2
  Filled 2015-12-03 (×2): qty 1
  Filled 2015-12-03: qty 2
  Filled 2015-12-03 (×2): qty 1
  Filled 2015-12-03: qty 2
  Filled 2015-12-03 (×7): qty 1
  Filled 2015-12-03 (×3): qty 2
  Filled 2015-12-03: qty 1
  Filled 2015-12-03: qty 2
  Filled 2015-12-03: qty 1

## 2015-12-03 MED ORDER — MAGNESIUM SULFATE 2 GM/50ML IV SOLN
2.0000 g | Freq: Once | INTRAVENOUS | Status: AC
  Administered 2015-12-03: 2 g via INTRAVENOUS
  Filled 2015-12-03: qty 50

## 2015-12-03 MED ORDER — TRACE MINERALS CR-CU-MN-SE-ZN 10-1000-500-60 MCG/ML IV SOLN
INTRAVENOUS | Status: AC
  Administered 2015-12-03: 18:00:00 via INTRAVENOUS
  Filled 2015-12-03: qty 1560

## 2015-12-03 MED ORDER — FAT EMULSION 20 % IV EMUL
240.0000 mL | INTRAVENOUS | Status: AC
  Administered 2015-12-03: 240 mL via INTRAVENOUS
  Filled 2015-12-03: qty 250

## 2015-12-03 MED ORDER — ENOXAPARIN SODIUM 30 MG/0.3ML ~~LOC~~ SOLN
30.0000 mg | SUBCUTANEOUS | Status: DC
  Administered 2015-12-03: 30 mg via SUBCUTANEOUS
  Filled 2015-12-03: qty 0.3

## 2015-12-03 MED ORDER — HYDROMORPHONE HCL 1 MG/ML IJ SOLN
1.0000 mg | INTRAMUSCULAR | Status: DC | PRN
  Administered 2015-12-03: 2 mg via INTRAVENOUS
  Administered 2015-12-03: 1 mg via INTRAVENOUS
  Filled 2015-12-03: qty 1
  Filled 2015-12-03: qty 2

## 2015-12-03 NOTE — Progress Notes (Signed)
While in the room at change of shift it was noted that patients oxygen saturations were decreasing on the nasal cannula. The day shift nurse informed me that this had not occurred on day shift. The patient was placed on a venti mask at 10L and encouraged to cough and deep breathe. The patient managed to cough up a small amount of tan colored sputum and it was suctioned from his mouth. His cough was weak. Cough and deep breathing were encouraged. The patient was sat upright in the bed and the venti mask left in place. Will continue to monitor for signs to return the patient to nasal cannula. Luther Parody, RN

## 2015-12-03 NOTE — Progress Notes (Addendum)
Pharmacy Antibiotic Follow-up Note  DYLYN LIGHTER is a 65 y.o. year-old male admitted on 11/21/2015.  The patient is currently on day 4 of Vancomycin + cefepime + Flagyl for peritonitis from perforated viscus.  Assessment/Plan:  Resume vancomycin at 750 mg IV q24 hr.  If continuing, may need to increase to 1000 mg q24 as renal function recovers  Continue Cefepime and Flagyl as ordered  Temp (24hrs), Avg:97.8 F (36.6 C), Min:97.2 F (36.2 C), Max:98.1 F (36.7 C)   Recent Labs Lab 11/28/15 0557 11/29/15 0452 11/30/15 0415 12/01/15 0315 12/02/15 0508  WBC 4.8 6.6 1.4* 3.3* 5.7    Recent Labs Lab 11/30/15 0415 12/01/15 0315 12/02/15 0508 12/02/15 0624 12/03/15 0545  CREATININE 0.60* 1.07 1.62* 1.66* 1.89*   Estimated Creatinine Clearance: 39.3 mL/min (by C-G formula based on Cr of 1.89).    Allergies  Allergen Reactions  . Zetia [Ezetimibe] Anaphylaxis and Swelling    Tongue and throat   . Atorvastatin Other (See Comments)    Malaise & muscle weakness  . Penicillins Other (See Comments)    Unknown.  Tolerates cefepime  . Pravastatin Sodium     Pravastatin 40 mg qday and 40 mg q M/W/F caused muscle aches  . Lisinopril Rash  . Promethazine Rash    Today Temp: remains afebrile following fever spike 1/5 AM WBC: recovered from low to wnl Renal: SCr bumped yesterday, slightly higher today, but UOP starting to recover.  CrCl 39 ml/min  Antimicrobials this admission: 1/5>> vanc >> 1/5>> primaxin >>1/5 (d/c'd due to seizure hx- concern for sz activity this admit although EEG negative) 1/5>>cefepime>> 1/5>>flagyl>>  Levels/dose changes this admission: 1/6 VT sl low at 14, increased to 1000 mg q8h 1/7: VT 29, vanc held, Cefepime reduced to 1g IV q12h.   1/8: VRm 17, 24 hr after previous trough  Microbiology results: 1/5 Blood x2: ngtd 1/5 Urine: ngf   Thank you for allowing pharmacy to be a part of this patient's care.  Hend Mccarrell A PharmD 12/03/2015  4:56 PM

## 2015-12-03 NOTE — Progress Notes (Signed)
PARENTERAL NUTRITION CONSULT NOTE - FOLLOW-UP  Pharmacy Consult for TPN Indication: Bowel obstruction  Allergies  Allergen Reactions  . Zetia [Ezetimibe] Anaphylaxis and Swelling    Tongue and throat   . Atorvastatin Other (See Comments)    Malaise & muscle weakness  . Penicillins Other (See Comments)    Unknown.  Tolerates cefepime  . Pravastatin Sodium     Pravastatin 40 mg qday and 40 mg q M/W/F caused muscle aches  . Lisinopril Rash  . Promethazine Rash    Patient Measurements: Height: 6\' 2"  (188 cm) Weight: 153 lb 3.5 oz (69.5 kg) IBW/kg (Calculated) : 82.2 Usual Weight: 137-140 lbs  Vital Signs: Temp: 98 F (36.7 C) (01/08 0817) Temp Source: Oral (01/08 0817) Pulse Rate: 79 (01/08 0900) Intake/Output from previous day: 01/07 0701 - 01/08 0700 In: 6084.6 [I.V.:2875; IV Piggyback:1500; TPN:1709.6] Out: 1531 [Urine:680; Emesis/NG output:850; Stool:1] Intake/Output from this shift: Total I/O In: 359.2 [I.V.:209.2; TPN:150] Out: 135 [Urine:135]  Labs:  Recent Labs  12/01/15 0315 12/02/15 0508  WBC 3.3* 5.7  HGB 16.1 12.4*  HCT 48.0 37.8*  PLT PLATELET CLUMPS NOTED ON SMEAR, UNABLE TO ESTIMATE 123*     Recent Labs  12/01/15 0315 12/02/15 0508 12/02/15 0624 12/03/15 0545  NA 141 132* 142 141  K 3.8 5.5* 4.0 3.8  CL 108 105 111 112*  CO2 23 22 24  21*  GLUCOSE 167* 719* 169* 154*  BUN 16 29* 31* 39*  CREATININE 1.07 1.62* 1.66* 1.89*  CALCIUM 8.8* 7.7* 7.8* 7.8*  MG 2.0 2.2  --  1.6*  PHOS 2.3* 6.3*  --  1.9*   Estimated Creatinine Clearance: 39.3 mL/min (by C-G formula based on Cr of 1.89).    Recent Labs  12/03/15 0019 12/03/15 0412 12/03/15 0740  GLUCAP 106* 124* 136*    Medical History: Past Medical History  Diagnosis Date  . Hyperlipidemia   . Hypertension   . Leg pain   . Peripheral vascular disease ([place])     s/p prior Ao-Bifem BPG  . 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  Coronary artery disease     a. NSTEMI 7/14=> LHC 06/07/13: Proximal LAD 30%, ostial D1 30%, AV circumflex 80%, then occluded, ostial OM2 occluded, left to left collaterals, mid RCA 95%, inferior HK, EF 60%. PCI: Promus DES to the mid RCA.  No intervention was recommended for the CTO circumflex.  . Ischemic cardiomyopathy     a. Echocardiogram 06/06/13: EF 35-40%, inferior AK, mild MR.  Marland Kitchen Shortness of breath     "can come on at anytime lately; before that it was just w/exertion" (11/10/2013)  . Type II diabetes mellitus ([place])   . 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 Arthritis     "neck, back, legs" (11/10/2013)  . Kidney stones     "when I was younger; passed w/o treatment" (11/10/2013)  . Myocardial infarction ([place])   . Diabetic neuropathy ([place])   . H. pylori infection   . Wears glasses   . NSTEMI (non-ST elevated myocardial infarction) ([place]) 06/05/2013  . Shoulder contusion 11/07/2013    Medications:  Infusions:  . 0.9 % NaCl with KCl 20 mEq / L 75 mL/hr at 12/03/15 0811  . TPN (CLINIMIX) Adult without lytes 65 mL/hr at 12/03/15 0900   And  . fat emulsion 240 mL (12/02/15  1711)    Insulin Requirements in the past 24 hours: 19 units yesterday + 10 units Novolog in TPN  Current Nutrition: NPO  IVF: NS KCl 20 meq/L @ 75 ml/hr  Central access: PICC placed 1/3 TPN start date: 1/3  ASSESSMENT                                                                                                          HPI: 85 yoM presented to ED on 12/27 with abdominal pain.  PMH includes HTN, HLD, PVD - s/p fem bypass, CAD s/p NSTEMI 2014, cardiomyopathy, DM, diabetic peripheral neuropathy, seizures, and chronic pain on chronic opioids.  Surgical history significant for open AAA repair.  CT shows partial SBO, likely adhesions.  Patient refusing surgery.  Plan for conservative management with TPN, bowel rest.   Significant events:  1/1: TPN per  pharmacy ordered - unable to start without central access.  1/3: PICC line placed; TPN started 1/5: Acute encephalopathy with fevers (102F) overnight- sepsis protocol initiated.  Broad-spectrum abx started.  1/6: Consented to laparotomy w/ findings of perforated viscus and peritonitis. Txfer to ICU 1/7:    Today 12/03/2015:   Glucose: CBGs now at goal since yesterday afternoon (may have been transiently worsened perioperatively).  Hx DM on glipizide PTA  Electrolytes - Mg/Phos now low (not sure if yesterday's elevated Phos was d/t new AKI or spurious value - 1st lab draw appeared spurious but phos not rechecked w/ 2nd draw); Corr Ca borderline low; all other lytes wnl.  Renal - new AKI; SCr continues to rise but slower.  UOP poor but improving  I/O - MD reduced IVF, 850 ml drained from NGT yesterday  LFTs - AST/ALT and Tbili wnl  TGs - 124 (1/2)  Prealbumin - 3.7 (1/2)  NUTRITIONAL GOALS                                                                                             RD recs: 1600-1800 Kcal/day, 65-80 g protein/day, > 2 L fluid/day  Clinimix E 5/15 at a goal rate of 65 ml/hr + 20% fat emulsion at 10 ml/hr to provide: 78 g/day protein, 1588 Kcal/day.  PLAN  Will replace lytes in Clinimix E 5/15 and maintain goal rate of 65 ml/hr  Continue 20% fat emulsion at 10 ml/hr  Continue NS + 20 K at 75 ml/hr per Surgery  Standard MVI, MTE in TPN bag.  Mg 2g IV x 1  KPhos 30 mmol IV x 1 (will provide 45 mEq potassium)  Continue q6h CBGs with moderate correction insulin.  Continue 10 units insulin in TPN  TPN lab panels on Mondays & Thursdays.    Reuel Boom, PharmD, BCPS Pager: [phone] 12/03/2015, 9:06 AM

## 2015-12-03 NOTE — Progress Notes (Signed)
eLink Physician-Brief Progress Note Patient Name: [name] [name] DOB: Mar 02, 1951 MRN: [id number]   Date of Service  12/03/2015  HPI/Events of Note  Pt still having pain.  He takes opiates at home.  Fentanyl no controlling his pain.   eICU Interventions  Will re-order prn dilaudid.     Intervention Category Major Interventions: Other:  Arianne Klinge 12/03/2015, 12:41 AM

## 2015-12-03 NOTE — Progress Notes (Signed)
Patient removed venti mask and oxygen saturations began to drop into the 80's. I replaced the mask, but saturations did not improve at the 10L setting. The mask was increased to 14L and the patient was encouraged to cough and deep breathe. A pillow was placed over the patients abdominal incision and he was educated on placing counter pressure to assist with decreasing the pain with coughing. The patients oxygen saturations improved to the low 90's. The mask setting was left at 14L for the time being until saturations increase. Will continue to monitor and encourage coughing, turning, and deep breathing. Luther Parody, RN

## 2015-12-03 NOTE — Progress Notes (Signed)
PULMONARY / CRITICAL CARE MEDICINE   Name: [name] [name] MRN: [id number] DOB: 1951-08-10    ADMISSION DATE:  11/21/2015 CONSULTATION DATE:  1/6  REFERRING MD:  Hassell Done   CHIEF COMPLAINT:    HISTORY OF PRESENT ILLNESS:   65 year old aam who was admitted on 12/27 w/ abd pain found to be 2/2 SBO on CT scan. Initially managed conservatively, but then on 1/1 was offered surgery given no significant improvement. He refused surgery for several days. His hospital course was complicated by fever spike and delirium on 1/5. At that time has abd was more distended. He had an EEG which was did not show seizures. He finally agreed to surgery on 1/6 d/t no resolution of abd pain. He went for exploratory lap which was notable for significant peritoneal contamination. He is s/p exploration, washout and repair of perforated viscous. PCCM was asked to assess post op and assist w/ post op critical care support.   SUBJECTIVE:  C/o pain -better with dlaudid UO low afebrile  VITAL SIGNS: BP 106/67 mmHg  Pulse 79  Temp(Src) 98 F (36.7 C) (Oral)  Resp 25  Ht 6\' 2"  (1.88 m)  Wt 69.5 kg (153 lb 3.5 oz)  BMI 19.66 kg/m2  SpO2 95%  HEMODYNAMICS:    VENTILATOR SETTINGS:    INTAKE / OUTPUT: I/O last 3 completed shifts: In: 8719.6 [I.V.:4385; IV Piggyback:1800] Out: 2206 [Urine:955; Emesis/NG output:1250; Stool:1]  PHYSICAL EXAMINATION: General:  Chronically ill appearing 64 year old AAM.  Neuro:  Awake, interactive, confused, oriented to name & palce Generalized weakness  HEENT:  NCAT, NRB Cardiovascular:  Tachy rrr; no MRG Lungs:  Clear, equal rise on vent. Crackles bases  Abdomen:  Distended. Mid abd dressing CD&I Musculoskeletal:  Equal st and bulk  Skin:  Dry and intact. LLE non healing ulcer dressing   LABS:  BMET  Recent Labs Lab 12/02/15 0508 12/02/15 0624 12/03/15 0545  NA 132* 142 141  K 5.5* 4.0 3.8  CL 105 111 112*  CO2 22 24 21*  BUN 29* 31* 39*  CREATININE 1.62*  1.66* 1.89*  GLUCOSE 719* 169* 154*    Electrolytes  Recent Labs Lab 12/01/15 0315 12/02/15 0508 12/02/15 0624 12/03/15 0545  CALCIUM 8.8* 7.7* 7.8* 7.8*  MG 2.0 2.2  --  1.6*  PHOS 2.3* 6.3*  --  1.9*    CBC  Recent Labs Lab 11/30/15 0415 12/01/15 0315 12/02/15 0508  WBC 1.4* 3.3* 5.7  HGB 12.9* 16.1 12.4*  HCT 39.8 48.0 37.8*  PLT PLATELET CLUMPS NOTED ON SMEAR, UNABLE TO ESTIMATE PLATELET CLUMPS NOTED ON SMEAR, UNABLE TO ESTIMATE 123*    Coag's No results for input(s): APTT, INR in the last 168 hours.  Sepsis Markers  Recent Labs Lab 11/30/15 0645 11/30/15 1020 11/30/15 1300  LATICACIDVEN 2.3* 1.8 1.8    ABG  Recent Labs Lab 12/01/15 1527 12/02/15 0115  PHART 7.330* 7.311*  PCO2ART 39.1 40.4  PO2ART 209* 81.7    Liver Enzymes  Recent Labs Lab 11/28/15 0557 11/29/15 0452 11/30/15 0415  AST 21 20 21   ALT 20 18 18   ALKPHOS 55 58 57  BILITOT 1.0 1.1 0.9  ALBUMIN 3.2* 3.3* 3.2*    Cardiac Enzymes No results for input(s): TROPONINI, PROBNP in the last 168 hours.  Glucose  Recent Labs Lab 12/02/15 1153 12/02/15 1549 12/02/15 2019 12/03/15 0019 12/03/15 0412 12/03/15 0740  GLUCAP 139* 122* 136* 106* 124* 136*    Imaging Dg Chest Port 1 [id number]  12/03/2015  CLINICAL DATA:  Acute respiratory failure. History of hypertension, smoking. EXAM: PORTABLE CHEST 1 VIEW COMPARISON:  Chest x-rays dated 12/01/2015, 11/30/2015 and 11/21/2015. FINDINGS: Right-sided PICC line remains well positioned with tip projected over the expected region of the cavoatrial junction. Nasogastric tube passes below the diaphragm. Heart size is upper normal, unchanged. Overall cardiomediastinal silhouette is stable in size and configuration. Endotracheal tube has been removed in the interval. Dense opacity at the left lung base is unchanged, probably atelectasis. Mildly prominent interstitial markings persist bilaterally suggesting mild edema, perhaps mildly increased on  the right. Veiled opacities at each lung base suggests additional small bilateral pleural effusions. IMPRESSION: 1. Perhaps mildly increased interstitial edema on the right. 2. Endotracheal tube has been removed. 3. No other significant interval change. Dense opacity at the left lung base is unchanged, atelectasis versus pneumonia. Mild bilateral interstitial edema. Probable small bilateral pleural effusions. Electronically Signed   By: Franki Cabot M.D.   On: 12/03/2015 07:56     STUDIES:  1/5 EEG: no seizures. C/w metabolic changes/meds  CULTURES: BCX2 1/5>>>ng  ANTIBIOTICS: Cefepime 1/5>>> Flagyl 1/5>>> vanc 1/4>>> Imipenem 1/4>>>1/5 (stopped d/t concern for seizure)  SIGNIFICANT EVENTS:   LINES/TUBES: RUE PICC 1/3>>> OETT 1/6>>>1/6   DISCUSSION: 65 year old male admitted 12/27 for SBO. Initially managed medically but when sxs not improved surgery was recommended on 1/1; the pt refused @ that time. He presents to the ICU on full vent support on 1/6 after abd exploration where he was found to have perforated viscous and peritonitis. He underwent washout & repair.   ASSESSMENT / PLAN:  PULMONARY A: POst op resp failure s/p exploratory laparotomy -resolved H/o nicotine abuse  P:  Ct [place]   CARDIOVASCULAR A:  Severe sepsis  H/o cad S/p left fem pop bypass w/ non-healing ulcer  P:  Resume ASA when ok w/surg Tele Wound care to LLE   RENAL A:   AKI - oliguric P:   Ct IVFs @ 125/h Renal dose meds   GASTROINTESTINAL A:   SBO w/ perforated viscous and resultant peritonitis  Severe Protein calorie malnutrition  P:   Bowel rest Cont TPN Wound care per surgery  PRN Fentanyl   HEMATOLOGIC A:   Neutropenia - resolved Dilutional anemia w/expected post-op blood loss  P:  Trend CBC [place] LMWH   INFECTIOUS A:   Peritonitis in setting of perforation d/t SBO Severe sepsis  P:   abx per above  F/u cultures   ENDOCRINE A:   Type II DM  P:   ssi    NEUROLOGIC A:   Acute Encephalopathy in setting of sepsis  H/o seizure d/o P:   Off AEDs X 2 mo before admit-->EEG 1/5 negative.  Watch closely for evidence of seizures.  If seizure activity would load Keppra   FAMILY  - Updates: none available  - Inter-disciplinary family meet or Palliative Care meeting due by:  1/13  Summary - Self extubated - doing now, expect prolonged course with peritonitis, delerium  - improving & oliguria are present concerns - hope to improve  The patient is critically ill with multiple organ systems failure and requires high complexity decision making for assessment and support, frequent evaluation and titration of therapies, application of advanced monitoring technologies and extensive interpretation of multiple databases. Critical Care Time devoted to patient care services described in this note independent of APP time is 31 minutes.   Rigoberto Noel MD   12/03/2015, 10:26 AM

## 2015-12-03 NOTE — Progress Notes (Signed)
[facility]  [address]  [place], [place] [address] Phone: [phone] FAX: [phone]   [name] [name] [id number] 02/04/1951   Assessment  GUARDED s/p ex lap & repair of SB perforation in setting of SB obstruction  Problem List:   Principal Problem:   Small bowel obstruction & perforation s/p OR exploration & repair 12/01/2015 Active Problems:   Tobacco abuse   CAD (coronary artery disease)   Protein-calorie malnutrition, severe (HCC)   Proteinuria   Encephalopathy   Sepsis (HCC)   Purulent peritonitis (HCC)   Pituitary disorder (HCC)   Ischemic cardiomyopathy   Type II diabetes mellitus (HCC)   Diabetic neuropathy (HCC)   Noncompliance   2 Days Post-Op  11/21/2015 - 12/01/2015  Procedure(s):  EXPLORATORY LAPAROTOMY LYSIS OF ADHESIONS RADICAL PERITONEAL DEBRIDEMENT CLOSURE OF SMALL BOWEL PERFORATION     Plan:  -IVF -NGT/NPO -follow MS - CIWA like protocol & gentle restraints until MS improves -seems to be flying off of vent s/p self extubation - wean [place] oxygen requirements as tolerated -TNA for prolonged ileus & severe malnutrition -start dressing changes   -VTE prophylaxis- SCDs, etc Peripheral vascular disease with nonhealing left foot ulcer  Status post left femoral to below-knee popliteal bypass on 07/2015 by Dr. early. He also had left great toe debridement on 11/14/2015 by Dr. Cabello. Continue wound care. -mobilize as tolerated to help recovery   C. , M.D., F.A.C.S. [facility] [address] [place], [place] [address] [phone] Main / Paging   12/03/2015  Subjective:  Confused & pulling at lines yesterday - soft restraints & meds needed More calm this AM but confused RN in room - pt flirting w her Dilaudid working better for pain - switched back - BP handling it better  Objective:  Vital  signs:  Filed Vitals:   12/03/15 0300 12/03/15 0400 12/03/15 0500 12/03/15 0600  Pulse: 81 86 84 82  Temp:      TempSrc:      Resp: 21 22 34 21  Height:      Weight:      SpO2: 96% 96% 96% 96%    Last BM Date: 12/02/15  Intake/Output   Yesterday:  01/07 0701 - 01/08 0700 In: 6084.6 [I.V.:2875; IV Piggyback:1500; TPN:1709.6] Out: 1531 [Urine:680; Emesis/NG output:850; Stool:1] This shift:     Bowel function:  Flatus: n  BM: small  Drain: thick bile  Physical Exam:  General: Pt awake/alert/oriented x4 in mild acute distress Eyes: PERRL, normal EOM.  Sclera clear.  No icterus Neuro: CN II-XII intact w/o focal sensory/motor deficits. Lymph: No head/neck/groin lymphadenopathy Psych:  No delerium/psychosis/paranoia HENT: Normocephalic, Mucus membranes moist.  No thrush Neck: Supple, No tracheal deviation Chest: No chest wall pain w good excursion CV:  Pulses intact.  Regular rhythm MS: Normal AROM mjr joints.  No obvious deformity Abdomen: Softer.  Mildly distended.   Dressing with old serosanguinous blood.  retention sutures in place.  Mildly tender at incisions only.  No evidence of peritonitis.  No incarcerated hernias. Ext:  SCDs BLE.  No mjr edema.  No cyanosis.  Stable foot ulcer Skin: No petechiae / purpura  Results:   Labs: Results for orders placed or performed during the hospital encounter of 11/21/15 (from the past 48 hour(s))  Blood gas, arterial     Status: Abnormal   Collection Time: 12/01/15  3:27 PM  Result Value Ref Range   FIO2   1.00    Delivery systems VENTILATOR    Mode PRESSURE REGULATED VOLUME CONTROL    VT 650 mL   LHR 14 resp/min   Peep/cpap 5.0 cm H20   pH, Arterial 7.330 (L) 7.350 - 7.450   pCO2 arterial 39.1 35.0 - 45.0 mmHg   pO2, Arterial 209 (H) 80.0 - 100.0 mmHg   Bicarbonate 20.0 20.0 - 24.0 mEq/L   TCO2 18.0 0 - 100 mmol/L   Acid-base deficit 5.0 (H) 0.0 - 2.0 mmol/L   O2 Saturation 98.8 %   Patient temperature 98.6     Collection site A-LINE    Drawn by COLLECTED BY RT    Sample type ARTERIAL DRAW    Allens test (pass/fail) PASS PASS  Glucose, capillary     Status: Abnormal   Collection Time: 12/01/15  5:17 PM  Result Value Ref Range   Glucose-Capillary 283 (H) 65 - 99 mg/dL   Comment 1 Notify RN    Comment 2 Document in Chart   Vancomycin, trough     Status: None   Collection Time: 12/01/15 11:00 PM  Result Value Ref Range   Vancomycin Tr 14 10.0 - 20.0 ug/mL  Glucose, capillary     Status: Abnormal   Collection Time: 12/02/15 12:11 AM  Result Value Ref Range   Glucose-Capillary 273 (H) 65 - 99 mg/dL  Blood gas, arterial     Status: Abnormal   Collection Time: 12/02/15  1:15 AM  Result Value Ref Range   FIO2 0.55    Delivery systems VENTURI MASK    pH, Arterial 7.311 (L) 7.350 - 7.450   pCO2 arterial 40.4 35.0 - 45.0 mmHg   pO2, Arterial 81.7 80.0 - 100.0 mmHg   Bicarbonate 19.8 (L) 20.0 - 24.0 mEq/L   TCO2 17.8 0 - 100 mmol/L   Acid-base deficit 5.6 (H) 0.0 - 2.0 mmol/L   O2 Saturation 93.6 %   Patient temperature 98.6    Collection site A-LINE    Drawn by [id number]    Sample type ARTERIAL DRAW   Glucose, capillary     Status: Abnormal   Collection Time: 12/02/15  4:05 AM  Result Value Ref Range   Glucose-Capillary 171 (H) 65 - 99 mg/dL  Basic metabolic panel     Status: Abnormal   Collection Time: 12/02/15  5:08 AM  Result Value Ref Range   Sodium 132 (L) 135 - 145 mmol/L    Comment: DELTA CHECK NOTED REPEATED TO VERIFY    Potassium 5.5 (H) 3.5 - 5.1 mmol/L    Comment: DELTA CHECK NOTED REPEATED TO VERIFY SLIGHT HEMOLYSIS    Chloride 105 101 - 111 mmol/L   CO2 22 22 - 32 mmol/L   Glucose, Bld 719 (HH) 65 - 99 mg/dL    Comment: CRITICAL RESULT CALLED TO, READ BACK BY AND VERIFIED WITHLoletha Grayer [facility] RN 1102 12/02/15 A NAVARRO MIGHT RECOLLECT    BUN 29 (H) 6 - 20 mg/dL   Creatinine, Ser 1.62 (H) 0.61 - 1.24 mg/dL   Calcium 7.7 (L) 8.9 - 10.3 mg/dL   GFR calc non Af Amer 44 (L)  >60 mL/min   GFR calc Af Amer 51 (L) >60 mL/min    Comment: (NOTE) The eGFR has been calculated using the CKD EPI equation. This calculation has not been validated in all clinical situations. eGFR's persistently <60 mL/min signify possible Chronic Kidney Disease.    Anion gap 5 5 - 15  Magnesium     Status: None  Collection Time: 12/02/15  5:08 AM  Result Value Ref Range   Magnesium 2.2 1.7 - 2.4 mg/dL  Phosphorus     Status: Abnormal   Collection Time: 12/02/15  5:08 AM  Result Value Ref Range   Phosphorus 6.3 (H) 2.5 - 4.6 mg/dL  CBC     Status: Abnormal   Collection Time: 12/02/15  5:08 AM  Result Value Ref Range   WBC 5.7 4.0 - 10.5 K/uL   RBC 4.12 (L) 4.22 - 5.81 MIL/uL   Hemoglobin 12.4 (L) 13.0 - 17.0 g/dL    Comment: RESULT REPEATED AND VERIFIED DELTA CHECK NOTED    HCT 37.8 (L) 39.0 - 52.0 %   MCV 91.7 78.0 - 100.0 fL   MCH 30.1 26.0 - 34.0 pg   MCHC 32.8 30.0 - 36.0 g/dL   RDW 15.5 11.5 - 15.5 %   Platelets 123 (L) 150 - 400 K/uL  Basic metabolic panel     Status: Abnormal   Collection Time: 12/02/15  6:24 AM  Result Value Ref Range   Sodium 142 135 - 145 mmol/L    Comment: DELTA CHECK NOTED   Potassium 4.0 3.5 - 5.1 mmol/L   Chloride 111 101 - 111 mmol/L   CO2 24 22 - 32 mmol/L   Glucose, Bld 169 (H) 65 - 99 mg/dL   BUN 31 (H) 6 - 20 mg/dL   Creatinine, Ser 1.66 (H) 0.61 - 1.24 mg/dL   Calcium 7.8 (L) 8.9 - 10.3 mg/dL   GFR calc non Af Amer 42 (L) >60 mL/min   GFR calc Af Amer 49 (L) >60 mL/min    Comment: (NOTE) The eGFR has been calculated using the CKD EPI equation. This calculation has not been validated in all clinical situations. eGFR's persistently <60 mL/min signify possible Chronic Kidney Disease.    Anion gap 7 5 - 15  Glucose, capillary     Status: Abnormal   Collection Time: 12/02/15  8:01 AM  Result Value Ref Range   Glucose-Capillary 145 (H) 65 - 99 mg/dL  Glucose, capillary     Status: Abnormal   Collection Time: 12/02/15 11:53 AM   Result Value Ref Range   Glucose-Capillary 139 (H) 65 - 99 mg/dL  Vancomycin, trough     Status: Abnormal   Collection Time: 12/02/15  3:00 PM  Result Value Ref Range   Vancomycin Tr 29 (HH) 10.0 - 20.0 ug/mL    Comment: CRITICAL RESULT CALLED TO, READ BACK BY AND VERIFIED WITH: A.ARNOLD AT [id number] ON 12/02/15 BY S.VANHOORNE   Glucose, capillary     Status: Abnormal   Collection Time: 12/02/15  3:49 PM  Result Value Ref Range   Glucose-Capillary 122 (H) 65 - 99 mg/dL  Glucose, capillary     Status: Abnormal   Collection Time: 12/02/15  8:19 PM  Result Value Ref Range   Glucose-Capillary 136 (H) 65 - 99 mg/dL  Glucose, capillary     Status: Abnormal   Collection Time: 12/03/15 12:19 AM  Result Value Ref Range   Glucose-Capillary 106 (H) 65 - 99 mg/dL  Glucose, capillary     Status: Abnormal   Collection Time: 12/03/15  4:12 AM  Result Value Ref Range   Glucose-Capillary 124 (H) 65 - 99 mg/dL  Basic metabolic panel     Status: Abnormal   Collection Time: 12/03/15  5:45 AM  Result Value Ref Range   Sodium 141 135 - 145 mmol/L   Potassium 3.8 3.5 - 5.1 mmol/L  Chloride 112 (H) 101 - 111 mmol/L   CO2 21 (L) 22 - 32 mmol/L   Glucose, Bld 154 (H) 65 - 99 mg/dL   BUN 39 (H) 6 - 20 mg/dL   Creatinine, Ser 1.89 (H) 0.61 - 1.24 mg/dL   Calcium 7.8 (L) 8.9 - 10.3 mg/dL   GFR calc non Af Amer 36 (L) >60 mL/min   GFR calc Af Amer 42 (L) >60 mL/min    Comment: (NOTE) The eGFR has been calculated using the CKD EPI equation. This calculation has not been validated in all clinical situations. eGFR's persistently <60 mL/min signify possible Chronic Kidney Disease.    Anion gap 8 5 - 15  Magnesium     Status: Abnormal   Collection Time: 12/03/15  5:45 AM  Result Value Ref Range   Magnesium 1.6 (L) 1.7 - 2.4 mg/dL  Phosphorus     Status: Abnormal   Collection Time: 12/03/15  5:45 AM  Result Value Ref Range   Phosphorus 1.9 (L) 2.5 - 4.6 mg/dL    Imaging / Studies: Dg Chest Port 1  View  12/01/2015  CLINICAL DATA:  Endotracheal tube placement ; sepsis, encephalopathy, coronary artery disease, current smoker. EXAM: PORTABLE CHEST 1 VIEW COMPARISON:  Portable chest x-ray of November 30, 2015 FINDINGS: There is been interval intubation of the trachea. The tip of the tube lies approximately 6.4 cm above the carina. There bibasilar infiltrates. The heart is normal in size. The pulmonary vascularity is not engorged. The esophagogastric tube tip projects below the inferior margin of the image. The right-sided PICC line tip projects over the proximal SVC. IMPRESSION: 1. Interval intubation of the trachea with reasonable positioning of the endotracheal tube. The other support tubes are in reasonable position. 2. Bibasilar alveolar opacities consistent with pneumonia. Electronically Signed   By: David  Jordan M.D.   On: 12/01/2015 16:14   Dg Abd 2 Views  12/01/2015  CLINICAL DATA:  Follow up small bowel obstruction. EXAM: ABDOMEN - 2 VIEW COMPARISON:  Multiple prior radiographs obtained over the last week. FINDINGS: The nasogastric tube appears unchanged, terminating in the mid abdomen. Small bowel distention in the upper abdomen is unchanged. There is some gas within the colon. There is no free intraperitoneal air on the decubitus view. Vascular stents are noted. IMPRESSION: No significant change in residual partial small bowel obstruction compared with recent priors. The degree of bowel distention is improved from the studies done last week. Electronically Signed   By: William  Veazey M.D.   On: 12/01/2015 08:44    Medications / Allergies: per chart  Antibiotics: Anti-infectives    Start     Dose/Rate Route Frequency Ordered Stop   12/02/15 1800  ceFEPIme (MAXIPIME) 1 g in dextrose 5 % 50 mL IVPB     1 g 100 mL/hr over 30 Minutes Intravenous Every 12 hours 12/02/15 1014     12/02/15 0600  vancomycin (VANCOCIN) IVPB 1000 mg/200 mL premix  Status:  Discontinued     1,000 mg 200 mL/hr over  60 Minutes Intravenous 3 times per day 12/02/15 0547 12/02/15 0954   12/01/15 2200  ceFEPIme (MAXIPIME) 1 g in dextrose 5 % 50 mL IVPB  Status:  Discontinued     1 g 100 mL/hr over 30 Minutes Intravenous 3 times per day 12/01/15 1455 12/02/15 1014   11/30/15 1400  ceFEPIme (MAXIPIME) 1 g in dextrose 5 % 50 mL IVPB  Status:  Discontinued     1 g 100 mL/hr   over 30 Minutes Intravenous 3 times per day 11/30/15 0913 12/01/15 1455   11/30/15 1000  metroNIDAZOLE (FLAGYL) IVPB 500 mg     500 mg 100 mL/hr over 60 Minutes Intravenous Every 8 hours 11/30/15 0858     11/30/15 0645  vancomycin (VANCOCIN) IVPB 750 mg/150 ml premix  Status:  Discontinued     750 mg 150 mL/hr over 60 Minutes Intravenous 3 times per day 11/30/15 0630 12/02/15 0547   11/30/15 0645  imipenem-cilastatin (PRIMAXIN) 500 mg in sodium chloride 0.9 % 100 mL IVPB  Status:  Discontinued     500 mg 200 mL/hr over 30 Minutes Intravenous 3 times per day 11/30/15 0630 11/30/15 0859        Note: Portions of this report may have been transcribed using voice recognition software. Every effort was made to ensure accuracy; however, inadvertent computerized transcription errors may be present.   Any transcriptional errors that result from this process are unintentional.      C. , M.D., F.A.C.S. [facility] [address] [place], [place] [address] [phone] Main / Paging   12/03/2015  CARE TEAM:  PCP: TALBOT, DAVID C, MD  Outpatient Care Team: Patient Care Team: David C Talbot, MD as PCP - General (Internal Medicine) Amelia Wilson, MD as Referring Physician (Family Medicine) Michael A Jones, DPM as Referring Physician (Podiatry) Todd F Early, MD as Consulting Physician (Vascular Surgery)  Inpatient Treatment Team: Treatment Team: Attending Provider: Rakesh Alva V, MD; Consulting Physician: Md Ccs, MD; Technician: Niozjia S  Hoover, NT; Rounding Team: Md Pccm, MD; Registered Nurse: Mandesia R Hairston, RN  

## 2015-12-03 NOTE — Progress Notes (Signed)
Patient c/o severe pain after receiving fentanyl. He is very agitated and angry. He was getting dilaudid which was changed to fentanyl yesterday. He takes oxycodone 45mg  q 6 hours at home prior to hospitalization. Discussed his pain management with pharmacist and requested PCCM MD to assess. Concerned that patient could be having withdrawal from dilaudid.

## 2015-12-04 ENCOUNTER — Encounter (HOSPITAL_COMMUNITY): Payer: Self-pay | Admitting: Surgery

## 2015-12-04 DIAGNOSIS — N179 Acute kidney failure, unspecified: Secondary | ICD-10-CM

## 2015-12-04 DIAGNOSIS — K659 Peritonitis, unspecified: Secondary | ICD-10-CM

## 2015-12-04 LAB — COMPREHENSIVE METABOLIC PANEL
ALT: 15 U/L — AB (ref 17–63)
ANION GAP: 4 — AB (ref 5–15)
AST: 30 U/L (ref 15–41)
Albumin: 1.9 g/dL — ABNORMAL LOW (ref 3.5–5.0)
Alkaline Phosphatase: 66 U/L (ref 38–126)
BUN: 42 mg/dL — ABNORMAL HIGH (ref 6–20)
CHLORIDE: 115 mmol/L — AB (ref 101–111)
CO2: 22 mmol/L (ref 22–32)
Calcium: 8.4 mg/dL — ABNORMAL LOW (ref 8.9–10.3)
Creatinine, Ser: 1.88 mg/dL — ABNORMAL HIGH (ref 0.61–1.24)
GFR, EST AFRICAN AMERICAN: 42 mL/min — AB (ref >60)
GFR, EST NON AFRICAN AMERICAN: 36 mL/min — AB (ref >60)
Glucose, Bld: 161 mg/dL — ABNORMAL HIGH (ref 65–99)
POTASSIUM: 4.4 mmol/L (ref 3.5–5.1)
SODIUM: 141 mmol/L (ref 135–145)
Total Bilirubin: 0.5 mg/dL (ref 0.3–1.2)
Total Protein: 4.6 g/dL — ABNORMAL LOW (ref 6.5–8.1)

## 2015-12-04 LAB — DIFFERENTIAL
BASOS ABS: 0.1 10*3/uL (ref 0.0–0.1)
BASOS PCT: 1 %
EOS ABS: 0.3 10*3/uL (ref 0.0–0.7)
Eosinophils Relative: 3 %
LYMPHS PCT: 13 %
Lymphs Abs: 1.4 10*3/uL (ref 0.7–4.0)
MONO ABS: 0.9 10*3/uL (ref 0.1–1.0)
Monocytes Relative: 8 %
NEUTROS ABS: 8 10*3/uL — AB (ref 1.7–7.7)
NEUTROS PCT: 75 %

## 2015-12-04 LAB — CBC
HEMATOCRIT: 32.8 % — AB (ref 39.0–52.0)
HEMOGLOBIN: 11 g/dL — AB (ref 13.0–17.0)
MCH: 29.3 pg (ref 26.0–34.0)
MCHC: 33.5 g/dL (ref 30.0–36.0)
MCV: 87.5 fL (ref 78.0–100.0)
Platelets: 133 10*3/uL — ABNORMAL LOW (ref 150–400)
RBC: 3.75 MIL/uL — ABNORMAL LOW (ref 4.22–5.81)
RDW: 15.5 % (ref 11.5–15.5)
WBC: 10.7 10*3/uL — AB (ref 4.0–10.5)

## 2015-12-04 LAB — GLUCOSE, CAPILLARY
GLUCOSE-CAPILLARY: 154 mg/dL — AB (ref 65–99)
GLUCOSE-CAPILLARY: 169 mg/dL — AB (ref 65–99)
GLUCOSE-CAPILLARY: 182 mg/dL — AB (ref 65–99)
Glucose-Capillary: 294 mg/dL — ABNORMAL HIGH (ref 65–99)
Glucose-Capillary: 115 mg/dL — ABNORMAL HIGH (ref 65–99)
Glucose-Capillary: 104 mg/dL — ABNORMAL HIGH (ref 65–99)
Glucose-Capillary: 157 mg/dL — ABNORMAL HIGH (ref 65–99)

## 2015-12-04 LAB — MRSA PCR SCREENING: MRSA BY PCR: NEGATIVE

## 2015-12-04 LAB — PREALBUMIN: Prealbumin: <2 mg/dL — ABNORMAL LOW (ref 18–38)

## 2015-12-04 LAB — PHOSPHORUS: PHOSPHORUS: 3.4 mg/dL (ref 2.5–4.6)

## 2015-12-04 LAB — MAGNESIUM: MAGNESIUM: 2 mg/dL (ref 1.7–2.4)

## 2015-12-04 LAB — TRIGLYCERIDES: TRIGLYCERIDES: 164 mg/dL — AB (ref <150)

## 2015-12-04 MED ORDER — FAT EMULSION 20 % IV EMUL
240.0000 mL | INTRAVENOUS | Status: AC
  Administered 2015-12-04: 240 mL via INTRAVENOUS
  Filled 2015-12-04: qty 250

## 2015-12-04 MED ORDER — ENOXAPARIN SODIUM 40 MG/0.4ML ~~LOC~~ SOLN
40.0000 mg | SUBCUTANEOUS | Status: DC
  Administered 2015-12-04 – 2015-12-05 (×2): 40 mg via SUBCUTANEOUS
  Filled 2015-12-04 (×2): qty 0.4

## 2015-12-04 MED ORDER — M.V.I. ADULT IV INJ
INJECTION | INTRAVENOUS | Status: AC
  Administered 2015-12-04: 17:00:00 via INTRAVENOUS
  Filled 2015-12-04: qty 1560

## 2015-12-04 MED ORDER — HYDRALAZINE HCL 20 MG/ML IJ SOLN
10.0000 mg | Freq: Four times a day (QID) | INTRAMUSCULAR | Status: DC | PRN
  Administered 2015-12-04: 20 mg via INTRAVENOUS
  Filled 2015-12-04 (×2): qty 1

## 2015-12-04 NOTE — Progress Notes (Signed)
Nutrition Follow-up  INTERVENTION:   TPN per Pharmacy RD to continue to monitor   NUTRITION DIAGNOSIS:   Inadequate oral intake related to inability to eat as evidenced by NPO status.  Ongoing.  GOAL:   Patient will meet greater than or equal to 90% of their needs  Meeting with TPN.  MONITOR:   Labs, Weight trends, Skin, I & O's (TPN)  ASSESSMENT:   65 y.o. male with a past medical history of hyperlipidemia, hypertension, CAD, type 2 diabetes, diabetic peripheral neuropathy, seizure disorder who comes to the ER due to abdominal pain since this morning, shortly after he woke up, associated with abdominal distention, nausea and 2 episodes of emesis, one at home and one in the emergency department this afternoon per patient. He denies fever, chills, diarrhea, constipation, melena or hematochezia.  1/6: s/p ex lap & repair of SB perforation in setting of SB obstruction  Pt continues on TPN d/t post-op ileus and need for bowel rest. Pt intubated after surgery but was extubated hours later. Pt with NGT for suction with 250 ml of output.  Plan per Pharmacy:  Continue Clinimix E 5/15 at goal rate of 65 ml/h  Continue 20% fat emulsion at 10 ml/hr  Labs reviewed: CBGs: 115-169 Elevated BUN & Creatinine Mg/Phos WNL  Diet Order:  Diet NPO time specified Except for: Ice Chips .TPN (CLINIMIX-E) Adult TPN (CLINIMIX-E) Adult  Skin:  Wound (see comment) (L foot and toe DM ulcers)  Last BM:  1/7  Height:   Ht Readings from Last 1 Encounters:  12/01/15 6\' 2"  (1.88 m)    Weight:   Wt Readings from Last 1 Encounters:  12/02/15 153 lb 3.5 oz (69.5 kg)    Ideal Body Weight:  86.36 kg (kg)  BMI:  Body mass index is 19.66 kg/(m^2).  Estimated Nutritional Needs:   Kcal:  1600-1800  Protein:  65-80 grams  Fluid:  >/= 2 L/day  EDUCATION NEEDS:   No education needs identified at this time  Clayton Bibles, MS, RD, LDN Pager: [phone] After Hours Pager: [phone]

## 2015-12-04 NOTE — Progress Notes (Signed)
Date: December 04, 2015 Chart reviewed for concurrent status and case management needs. Will continue to follow patient for changes and needs: Rhonda Davis, RN, BSN, CCM   [phone] 

## 2015-12-04 NOTE — Progress Notes (Signed)
3 Days Post-Op  Subjective: There are allot of things going on with him.  Right now he's back on [place] with sats 95%.  Brown colored sputum.  He is on 3 antibiotics so I'm not sure a culture will grow much.  The nurses say he cannot lift his leg up to get in back in bed.  From a surgical standpoint he has no BS, his open wound looks fine.  Continue dressing changes.  He has an ileus and is on TNA for nutrition.    Objective: Vital signs in last 24 hours: Temp:  [97.2 F (36.2 C)-99.4 F (37.4 C)] 99.4 F (37.4 C) (01/09 0400) Pulse Rate:  [72-96] 88 (01/09 0600) Resp:  [11-36] 28 (01/09 0600) BP: (141-171)/(78-97) 162/90 mmHg (01/09 0600) SpO2:  [87 %-96 %] 88 % (01/09 0600) Arterial Line BP: (113-148)/(59-65) 148/64 mmHg (01/08 1200) Last BM Date: 12/02/15 1544IV 1320 urine NG 650 Afebrile,  BP up,  Sats are down and staff trying to treat, but pt not always cooperative Labs:  Creatinine up, glucose is up, WBC is up, platelets are down some CXR yesterday shows mild interstitial edema, dense opacity LLL, small effusions bilat, tan colored sputum last PM Intake/Output from previous day: 01/08 0701 - 01/09 0700 In: 4544.2 [I.V.:1784.2; IV Piggyback:1110; TPN:1650] Out: 1970 [Urine:1320; Emesis/NG output:650] Intake/Output this shift:    General appearance: alert, cooperative and no distress GI: mildly distended, no bowel sounds, wound is OK,  No flatus or BM.  Lab Results:   Recent Labs  12/02/15 0508 12/04/15 0447  WBC 5.7 10.7*  HGB 12.4* 11.0*  HCT 37.8* 32.8*  PLT 123* 133*    BMET  Recent Labs  12/03/15 0545 12/04/15 0447  NA 141 141  K 3.8 4.4  CL 112* 115*  CO2 21* 22  GLUCOSE 154* 161*  BUN 39* 42*  CREATININE 1.89* 1.88*  CALCIUM 7.8* 8.4*   PT/INR No results for input(s): LABPROT, INR in the last 72 hours.   Recent Labs Lab 11/28/15 0557 11/29/15 0452 11/30/15 0415 12/04/15 0447  AST 21 20 21 30   ALT 20 18 18  15*  ALKPHOS 55 58 57 66   BILITOT 1.0 1.1 0.9 0.5  PROT 5.9* 5.8* 6.0* 4.6*  ALBUMIN 3.2* 3.3* 3.2* 1.9*     Lipase     Component Value Date/Time   LIPASE 21 11/21/2015 1621     Studies/Results: Dg Chest Port 1 View  12/03/2015  CLINICAL DATA:  Acute respiratory failure. History of hypertension, smoking. EXAM: PORTABLE CHEST 1 VIEW COMPARISON:  Chest x-rays dated 12/01/2015, 11/30/2015 and 11/21/2015. FINDINGS: Right-sided PICC line remains well positioned with tip projected over the expected region of the cavoatrial junction. Nasogastric tube passes below the diaphragm. Heart size is upper normal, unchanged. Overall cardiomediastinal silhouette is stable in size and configuration. Endotracheal tube has been removed in the interval. Dense opacity at the left lung base is unchanged, probably atelectasis. Mildly prominent interstitial markings persist bilaterally suggesting mild edema, perhaps mildly increased on the right. Veiled opacities at each lung base suggests additional small bilateral pleural effusions. IMPRESSION: 1. Perhaps mildly increased interstitial edema on the right. 2. Endotracheal tube has been removed. 3. No other significant interval change. Dense opacity at the left lung base is unchanged, atelectasis versus pneumonia. Mild bilateral interstitial edema. Probable small bilateral pleural effusions. Electronically Signed   By: Franki Cabot M.D.   On: 12/03/2015 07:56    Medications: . antiseptic oral rinse  7 mL Mouth Rinse  q12n4p  . bacitracin   Topical Daily  . ceFEPime (MAXIPIME) IV  1 g Intravenous Q12H  . chlorhexidine  15 mL Mouth Rinse BID  . collagenase  1 application Topical Daily  . enoxaparin (LOVENOX) injection  30 mg Subcutaneous Q24H  . insulin aspart  0-15 Units Subcutaneous 6 times per day  . lip balm  1 application Topical BID  . metronidazole  500 mg Intravenous Q8H  . pantoprazole (PROTONIX) IV  40 mg Intravenous Q12H  . sodium chloride  3 mL Intravenous Q12H  . thiamine  100  mg Oral Daily   Or  . thiamine  100 mg Intravenous Daily  . vancomycin  750 mg Intravenous Q24H   . Marland KitchenTPN (CLINIMIX-E) Adult 65 mL/hr at 12/03/15 1745   And  . fat emulsion 240 mL (12/03/15 1745)  . 0.9 % NaCl with KCl 20 mEq / L 75 mL/hr at 12/04/15 0600   Assessment/Plan s/p ex lap & repair of SB perforation in setting of SB obstruction, 12/01/15, Dr. Johnathan Hausen Sepsis  SBO day 10 before he accepted surgery Post op ileus Atelectasis, and pleural effusion, possible pneumonia Chronic constipation on pain medications Encephalopathy/confusion now appears resolved Mild renal insuffiencey  Malnutrition with prealbumin 3.7 on 11/27/15 Hx of AO bifem 11/25/11. CAD s/p NSTEMI 05/2013 WITH stendts to RCA Ischemic cardiomyopathy  S/p iliac artery stent AODM Diabetic neuropathy Arthritis  Tobacco use Antibiotics: day 5 vancomycin/Cefepime/flagyl DVT: SCD/Lovenox  Plan:  From our standpoint, continue TNA, bowel rest, and NG suction. Continue antibiotics.  Not allot coming out of the NG, but he has now bowel function.  His confusion is better this AM with me anyway, he says her feels very sleepy.       LOS: 13 days    [name] [name] 12/04/2015

## 2015-12-04 NOTE — Progress Notes (Addendum)
PULMONARY / CRITICAL CARE MEDICINE   Name: [name] [name] MRN: [id number] DOB: 1951-03-08    ADMISSION DATE:  11/21/2015 CONSULTATION DATE:  1/6  REFERRING MD:  Hassell Done   CHIEF COMPLAINT:    HISTORY OF PRESENT ILLNESS:   65 year old aam who was admitted on 12/27 w/ abd pain found to be 2/2 SBO on CT scan. Initially managed conservatively, but then on 1/1 was offered surgery given no significant improvement. He refused surgery for several days. His hospital course was complicated by fever spike and delirium on 1/5. At that time has abd was more distended. He had an EEG which was did not show seizures. He finally agreed to surgery on 1/6 d/t no resolution of abd pain. He went for exploratory lap which was notable for significant peritoneal contamination. He is s/p exploration, washout and repair of perforated viscous. PCCM was asked to assess post op and assist w/ post op critical care support.   SUBJECTIVE:  C/o pain -better  UO low afebrile  VITAL SIGNS: BP 163/96 mmHg  Pulse 89  Temp(Src) 98.2 F (36.8 C) (Axillary)  Resp 19  Ht 6\' 2"  (1.88 m)  Wt 69.5 kg (153 lb 3.5 oz)  BMI 19.66 kg/m2  SpO2 95%  HEMODYNAMICS:    VENTILATOR SETTINGS:    INTAKE / OUTPUT: I/O last 3 completed shifts: In: 7078.8 [I.V.:3284.2; IV Piggyback:1260] Out: [id number]; Emesis/NG output:1500]  PHYSICAL EXAMINATION: General:  Chronically ill appearing AAM.  Neuro:  Awake, interactive, confused, oriented to name & palce Generalized weakness  HEENT:  NCAT, NRB Cardiovascular:  Tachy rrr; no MRG Lungs:  Clear, equal rise on vent. Crackles bases  Abdomen:  Distended. Mid abd dressing CD&I Musculoskeletal:  Equal st and bulk  Skin:  Dry and intact. LLE non healing ulcer dressing   LABS:  BMET  Recent Labs Lab 12/02/15 0624 12/03/15 0545 12/04/15 0447  NA 142 141 141  K 4.0 3.8 4.4  CL 111 112* 115*  CO2 24 21* 22  BUN 31* 39* 42*  CREATININE 1.66* 1.89* 1.88*  GLUCOSE 169*  154* 161*    Electrolytes  Recent Labs Lab 12/02/15 0508 12/02/15 0624 12/03/15 0545 12/04/15 0447  CALCIUM 7.7* 7.8* 7.8* 8.4*  MG 2.2  --  1.6* 2.0  PHOS 6.3*  --  1.9* 3.4    CBC  Recent Labs Lab 12/01/15 0315 12/02/15 0508 12/04/15 0447  WBC 3.3* 5.7 10.7*  HGB 16.1 12.4* 11.0*  HCT 48.0 37.8* 32.8*  PLT PLATELET CLUMPS NOTED ON SMEAR, UNABLE TO ESTIMATE 123* 133*    Coag's No results for input(s): APTT, INR in the last 168 hours.  Sepsis Markers  Recent Labs Lab 11/30/15 0645 11/30/15 1020 11/30/15 1300  LATICACIDVEN 2.3* 1.8 1.8    ABG  Recent Labs Lab 12/01/15 1527 12/02/15 0115  PHART 7.330* 7.311*  PCO2ART 39.1 40.4  PO2ART 209* 81.7    Liver Enzymes  Recent Labs Lab 11/29/15 0452 11/30/15 0415 12/04/15 0447  AST 20 21 30   ALT 18 18 15*  ALKPHOS 58 57 66  BILITOT 1.1 0.9 0.5  ALBUMIN 3.3* 3.2* 1.9*    Cardiac Enzymes No results for input(s): TROPONINI, PROBNP in the last 168 hours.  Glucose  Recent Labs Lab 12/03/15 1217 12/03/15 1628 12/03/15 2000 12/04/15 0057 12/04/15 0406 12/04/15 0744  GLUCAP 113* 155* 118* 169* 154* 115*    Imaging No results found.   STUDIES:  1/5 EEG: no seizures. C/w metabolic changes/meds  CULTURES: BCX2  1/5>>>ng  ANTIBIOTICS: Cefepime 1/5>>> Flagyl 1/5>>> vanc 1/4>>> 1/9 Imipenem 1/4>>>1/5 (stopped d/t concern for seizure)  SIGNIFICANT EVENTS:   LINES/TUBES: RUE PICC 1/3>>> OETT 1/6>>>1/6   DISCUSSION: 65 year old male admitted 12/27 for SBO. Initially managed medically but when sxs not improved surgery was recommended on 1/1; the pt refused @ that time. He presents to the ICU on full vent support on 1/6 after abd exploration where he was found to have perforated viscous and peritonitis. He underwent washout & repair.   ASSESSMENT / PLAN:  PULMONARY A: POst op resp failure s/p exploratory laparotomy -resolved H/o nicotine abuse  P:  Ct [place]   CARDIOVASCULAR A:   Severe sepsis  H/o cad S/p left fem pop bypass w/ non-healing ulcer  P:  Resume ASA when ok w/surg Tele Wound care to LLE   RENAL A:   AKI -UO improving P:   Ct IVFs @ 75/h Renal dose meds   GASTROINTESTINAL A:   SBO w/ perforated viscous and resultant peritonitis  Severe Protein calorie malnutrition - underweight, alb 1.9 P:   Bowel rest Cont TPN Wound care per surgery  PRN Fentanyl   HEMATOLOGIC A:   Neutropenia - resolved Dilutional anemia w/expected post-op blood loss  P:  Trend CBC [place] LMWH   INFECTIOUS A:   Peritonitis in setting of perforation d/t SBO Severe sepsis  P:   Dc vanc, ct cefepime   ENDOCRINE A:   Type II DM  P:   ssi   NEUROLOGIC A:   Acute Encephalopathy in setting of sepsis  H/o seizure d/o-Off AEDs X 2 mo before admit-->EEG 1/5 negative.  P:   Watch closely for evidence of seizures.  If seizure activity would load Keppra   FAMILY  - Updates: none available  - Inter-disciplinary family meet or Palliative Care meeting due by:  1/13  Summary -Expect prolonged course with peritonitis, on bowel rest , delerium & oliguria  - improving, are present concerns  Try to mobilise with PT  The patient is critically ill with multiple organ systems failure and requires high complexity decision making for assessment and support, frequent evaluation and titration of therapies, application of advanced monitoring technologies and extensive interpretation of multiple databases. Critical Care Time devoted to patient care services described in this note independent of APP time is 31 minutes.   Rigoberto Noel MD   12/04/2015, 9:06 AM

## 2015-12-04 NOTE — Progress Notes (Signed)
PCCM pick up for 12/05/15.  The patient is a 65 year old man with a PMH of hyperlipidemia, hypertension, CAD, type 2 diabetes, diabetic peripheral neuropathy, seizure disorder, polysubstance abuse and chronic pain issues (on high doses of oxycodone prior to admission) who was admitted 11/22/15 with a small bowel obstruction. Surgical consultation was obtained on admission with conservative therapy initially recommended. His bowel obstruction was felt to be from adhesions and possible narcotic bowel. His NG tube was removed 11/23/15 and he seemed to be improving clinically, but pain returned 11/24/15 with diet advancement to clears. Repeat films showed worsening small bowel dilatation so his NG tube was reinserted. TPN was subsequently ordered. On 11/27/15, the patient was noted to be refusing PICC line insertion for TPN and surgical intervention. PICC finally placed 11/28/15 with initiation of TPN. On 11/30/15, the patient's condition deteriorated acutely and he was found to be confused and restless. Rapid response was called. He was febrile and tachycardic with an elevated lactate level. Due to concerns for sepsis, broad-spectrum antibiotics were initiated with vancomycin and Primaxin which was switched to cefepime given his seizure history. Underwent exploratory laparotomy on 12/01/15 with findings of perforated viscus with peritoneal contamination/peritonitis. He remained intubated postoperatively and was under the care of the critical care team. He self extubated 12/02/15. Surgery continues to follow, and has an open abdominal wound. He continues to have an ileus and is on TNA at this time. [name] will assume care 12/05/15.  RAMA,CHRISTINA 12/04/2015 4:14 PM

## 2015-12-04 NOTE — Progress Notes (Addendum)
PARENTERAL NUTRITION CONSULT NOTE - FOLLOW-UP  Pharmacy Consult for TPN Indication: Bowel obstruction  Allergies  Allergen Reactions  . Zetia [Ezetimibe] Anaphylaxis and Swelling    Tongue and throat   . Atorvastatin Other (See Comments)    Malaise & muscle weakness  . Penicillins Other (See Comments)    Unknown.  Tolerates cefepime  . Pravastatin Sodium     Pravastatin 40 mg qday and 40 mg q M/W/F caused muscle aches  . Lisinopril Rash  . Promethazine Rash    Patient Measurements: Height: 6\' 2"  (188 cm) Weight: 153 lb 3.5 oz (69.5 kg) IBW/kg (Calculated) : 82.2 Usual Weight: 137-140 lbs  Vital Signs: Temp: 99.4 F (37.4 C) (01/09 0400) Temp Source: Axillary (01/09 0400) BP: 163/96 mmHg (01/09 0700) Pulse Rate: 87 (01/09 0700) Intake/Output from previous day: 01/08 0701 - 01/09 0700 In: 4544.2 [I.V.:1784.2; IV Piggyback:1110; TPN:1650] Out: 1970 [Urine:1320; Emesis/NG output:650] Intake/Output from this shift:    Labs:  Recent Labs  12/02/15 0508 12/04/15 0447  WBC 5.7 10.7*  HGB 12.4* 11.0*  HCT 37.8* 32.8*  PLT 123* 133*     Recent Labs  12/02/15 0508 12/02/15 0624 12/03/15 0545 12/04/15 0447  NA 132* 142 141 141  K 5.5* 4.0 3.8 4.4  CL 105 111 112* 115*  CO2 22 24 21* 22  GLUCOSE 719* 169* 154* 161*  BUN 29* 31* 39* 42*  CREATININE 1.62* 1.66* 1.89* 1.88*  CALCIUM 7.7* 7.8* 7.8* 8.4*  MG 2.2  --  1.6* 2.0  PHOS 6.3*  --  1.9* 3.4  PROT  --   --   --  4.6*  ALBUMIN  --   --   --  1.9*  AST  --   --   --  30  ALT  --   --   --  15*  ALKPHOS  --   --   --  66  BILITOT  --   --   --  0.5   Estimated Creatinine Clearance: 39.5 mL/min (by C-G formula based on Cr of 1.88).    Recent Labs  12/03/15 1217 12/03/15 1628 12/03/15 2000  GLUCAP 113* 155* 118*    Medical History: Past Medical History  Diagnosis Date  . Hyperlipidemia   . Hypertension   . Leg pain   . Peripheral vascular disease ([place])     s/p prior Ao-Bifem BPG  .  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 Coronary artery disease     a. NSTEMI 7/14=> LHC 06/07/13: Proximal LAD 30%, ostial D1 30%, AV circumflex 80%, then occluded, ostial OM2 occluded, left to left collaterals, mid RCA 95%, inferior HK, EF 60%. PCI: Promus DES to the mid RCA.  No intervention was recommended for the CTO circumflex.  . Ischemic cardiomyopathy     a. Echocardiogram 06/06/13: EF 35-40%, inferior AK, mild MR.  Marland Kitchen Shortness of breath     "can come on at anytime lately; before that it was just w/exertion" (11/10/2013)  . Type II diabetes mellitus ([place])   . [place] mal seizure [facility])     "last one was ~ 3 yr ago; controlled w/daily RX" (11/10/2013)  . Arthritis     "neck, back, legs" (11/10/2013)  . Kidney stones     "when I was younger; passed w/o treatment" (11/10/2013)  . Myocardial infarction ([place])   . Diabetic neuropathy ([place])   . H. pylori infection   .  Wears glasses   . NSTEMI (non-ST elevated myocardial infarction) ([place]) 06/05/2013  . Shoulder contusion 11/07/2013    Medications:  Infusions:  . Marland KitchenTPN (CLINIMIX-E) Adult 65 mL/hr at 12/03/15 1745   And  . fat emulsion 240 mL (12/03/15 1745)  . 0.9 % NaCl with KCl 20 mEq / L 75 mL/hr at 12/04/15 0600    Insulin Requirements in the past 24 hours: 11 units yesterday + 10 units Regular insulin in TPN  Current Nutrition: NPO  IVF: NS KCl 20 meq/L @ 75 ml/hr  Central access: PICC placed 1/3 TPN start date: 1/3  ASSESSMENT                                                                                                          HPI: 23 yoM presented to ED on 12/27 with abdominal pain.  PMH includes HTN, HLD, PVD - s/p fem bypass, CAD s/p NSTEMI 2014, cardiomyopathy, DM, diabetic peripheral neuropathy, seizures, and chronic pain on chronic opioids.  Surgical history significant for open AAA repair.  CT shows partial SBO, likely adhesions.  Patient refusing surgery.  Plan for  conservative management with TPN, bowel rest.   Significant events:  1/1: TPN per pharmacy ordered - unable to start without central access.  1/3: PICC line placed; TPN started 1/5: Acute encephalopathy with fevers (102F) overnight- sepsis protocol initiated.  Broad-spectrum abx started.  1/6: Consented to laparotomy w/ findings of perforated viscus and peritonitis. Txfer to ICU    Today 12/04/2015:   Glucose: CBGs at goal of < 155mg /dl except 1 at 155mg /dl.  Insulin added to TPN on  1/6. Hx DM on glipizide PTA  Electrolytes - Mg/Phos WNL s/p replacement 1/8; Corr Ca WNL; Cl- increased.  Renal - new AKI; SCr continues to rise but plateauing.  UOP poor but improving  I/O -  + 2.5L, NGT = 650 pit, UOP 0.8 ml/kg/h - improving  LFTs - AST/ALT and Tbili wnl  TGs - 124 (1/2), 164 (1/9)  Prealbumin - 3.7 (1/2), < 2 (1/9)  NUTRITIONAL GOALS                                                                                             RD recs: 1600-1800 Kcal/day, 65-80 g protein/day, > 2 L fluid/day  Clinimix E 5/15 at a goal rate of 65 ml/hr + 20% fat emulsion at 10 ml/hr to provide: 78 g/day protein, 1588 Kcal/day.  PLAN  Continue Clinimix E 5/15 at goal rate of 65 ml/h  Continue 20% fat emulsion at 10 ml/hr  Continue NS + 20 K at 75 ml/hr per Surgery  Consider removing KCl if K continues to trend up  Standard MVI, MTE in TPN bag.  Continue q6h CBGs with moderate correction insulin.  Continue 10 units insulin in TPN  TPN lab panels on Mondays & Thursdays.    Doreene Eland, PharmD, BCPS.   Pager: [phone] 12/04/2015 8:00 AM

## 2015-12-05 ENCOUNTER — Inpatient Hospital Stay (HOSPITAL_COMMUNITY): Payer: Medicaid Other

## 2015-12-05 ENCOUNTER — Ambulatory Visit: Payer: Medicaid Other | Admitting: Vascular Surgery

## 2015-12-05 DIAGNOSIS — K631 Perforation of intestine (nontraumatic): Secondary | ICD-10-CM

## 2015-12-05 DIAGNOSIS — K659 Peritonitis, unspecified: Secondary | ICD-10-CM

## 2015-12-05 LAB — POCT I-STAT 7, (LYTES, BLD GAS, ICA,H+H)
ACID-BASE DEFICIT: 7 mmol/L — AB (ref 0.0–2.0)
BICARBONATE: 21.4 meq/L (ref 20.0–24.0)
CALCIUM ION: 1.22 mmol/L (ref 1.13–1.30)
HEMATOCRIT: 42 % (ref 39.0–52.0)
Hemoglobin: 14.3 g/dL (ref 13.0–17.0)
O2 Saturation: 100 %
Potassium: 3.9 mmol/L (ref 3.5–5.1)
SODIUM: 138 mmol/L (ref 135–145)
TCO2: 23 mmol/L (ref 0–100)
pCO2 arterial: 55.6 mmHg — ABNORMAL HIGH (ref 35.0–45.0)
pH, Arterial: 7.193 — CL (ref 7.350–7.450)
pO2, Arterial: 220 mmHg — ABNORMAL HIGH (ref 80.0–100.0)

## 2015-12-05 LAB — GLUCOSE, CAPILLARY
GLUCOSE-CAPILLARY: 95 mg/dL (ref 65–99)
Glucose-Capillary: 140 mg/dL — ABNORMAL HIGH (ref 65–99)
Glucose-Capillary: 199 mg/dL — ABNORMAL HIGH (ref 65–99)
Glucose-Capillary: 133 mg/dL — ABNORMAL HIGH (ref 65–99)
Glucose-Capillary: 130 mg/dL — ABNORMAL HIGH (ref 65–99)
Glucose-Capillary: 145 mg/dL — ABNORMAL HIGH (ref 65–99)

## 2015-12-05 LAB — BASIC METABOLIC PANEL
ANION GAP: 4 — AB (ref 5–15)
BUN: 41 mg/dL — AB (ref 6–20)
CO2: 22 mmol/L (ref 22–32)
CREATININE: 1.92 mg/dL — AB (ref 0.61–1.24)
Calcium: 8.6 mg/dL — ABNORMAL LOW (ref 8.9–10.3)
Chloride: 116 mmol/L — ABNORMAL HIGH (ref 101–111)
GFR calc Af Amer: 41 mL/min — ABNORMAL LOW (ref >60)
GFR, EST NON AFRICAN AMERICAN: 35 mL/min — AB (ref >60)
GLUCOSE: 165 mg/dL — AB (ref 65–99)
POTASSIUM: 4.4 mmol/L (ref 3.5–5.1)
Sodium: 142 mmol/L (ref 135–145)

## 2015-12-05 LAB — CULTURE, BLOOD (ROUTINE X 2)
Culture: NO GROWTH
Culture: NO GROWTH

## 2015-12-05 LAB — MAGNESIUM: MAGNESIUM: 1.8 mg/dL (ref 1.7–2.4)

## 2015-12-05 LAB — PHOSPHORUS: Phosphorus: 3.4 mg/dL (ref 2.5–4.6)

## 2015-12-05 MED ORDER — SODIUM CHLORIDE 0.9 % IV SOLN
INTRAVENOUS | Status: DC
  Administered 2015-12-05: 09:00:00 via INTRAVENOUS

## 2015-12-05 MED ORDER — FUROSEMIDE 10 MG/ML IJ SOLN
80.0000 mg | Freq: Once | INTRAMUSCULAR | Status: AC
  Administered 2015-12-05: 80 mg via INTRAVENOUS
  Filled 2015-12-05: qty 8

## 2015-12-05 MED ORDER — TRACE MINERALS CR-CU-MN-SE-ZN 10-1000-500-60 MCG/ML IV SOLN
INTRAVENOUS | Status: AC
  Administered 2015-12-05: 17:00:00 via INTRAVENOUS
  Filled 2015-12-05: qty 1560

## 2015-12-05 MED ORDER — FAT EMULSION 20 % IV EMUL
240.0000 mL | INTRAVENOUS | Status: AC
  Administered 2015-12-05: 240 mL via INTRAVENOUS
  Filled 2015-12-05: qty 250

## 2015-12-05 NOTE — Progress Notes (Signed)
Pt has repeatedly tried to pull out NG tube today. At 1543 found in bed with patient. Call NP to ask about replacement. Per NP, no need to replace NG. Watch for signs of increased distention, n/v and discomfort. Pt currently has hypoactive BS in which NP is aware. Will continue to monitor.

## 2015-12-05 NOTE — Evaluation (Signed)
Physical Therapy Evaluation Patient Details Name: [name] [name] MRN: [id number] DOB: 10/04/1951 Today's Date: 12/05/2015   History of Present Illness  65 year old aam who was admitted on 12/27 w/ abd pain found to be 2/2 SBO on CT scan. Marland Kitchen His hospital course was complicated by fever spike and delirium on 1/5. At that time has abd was more distended.  He went for exploratory lap 12/01/15 which was notable for significant peritoneal contamination. He is s/p exploration, washout and repair of perforated viscous  Clinical Impression  Patient is on HFNC at 13 liters. sats > 90% and BP 165/95.Evaluation was very limited. Patient emotional at times. Pt admitted with above diagnosis. Pt currently with functional limitations due to the deficits listed below (see PT Problem List).  Pt will benefit from skilled PT to increase their independence and safety with mobility to allow discharge to the venue listed below.       Follow Up Recommendations SNF;Supervision/Assistance - 24 hour    Equipment Recommendations  None recommended by PT    Recommendations for Other Services       Precautions / Restrictions Precautions Precautions: Fall Precaution Comments: NG suction, on HFNC      Mobility  Bed Mobility Overal bed mobility: Needs Assistance             General bed mobility comments: patient assisted in bed/chair position with  legs dependent and trunk at 60*. Unable to get patient to participate in  attempts at sitting forward. Patient very emotional.  Transfers                    Ambulation/Gait                Stairs            Wheelchair Mobility    Modified Rankin (Stroke Patients Only)       Balance                                             Pertinent Vitals/Pain Pain Assessment: Faces Faces Pain Scale: Hurts whole lot Pain Location: abdomen Pain Descriptors / Indicators: Discomfort;Grimacing;Guarding;Crying Pain  Intervention(s): Limited activity within patient's tolerance;Repositioned    Home Living Family/patient expects to be discharged to:: Private residence Living Arrangements: Alone Available Help at Discharge: Friend(s);Available 24 hours/day Type of Home: House Home Access: Stairs to enter Entrance Stairs-Rails: Left Entrance Stairs-Number of Steps: 2 Home Layout: One level Home Equipment: Cane - single point      Prior Function           Comments: info obtained from previous admission in EPIC     Hand Dominance        Extremity/Trunk Assessment   Upper Extremity Assessment: RUE deficits/detail;LUE deficits/detail RUE Deficits / Details: patient did raise  arms to shoulder height at  some point when he was frustrated, beat his arms onto his legs.mittens on hands     LUE Deficits / Details: similar to r   Lower Extremity Assessment: RLE deficits/detail;LLE deficits/detail RLE Deficits / Details: prevalon boot  on, noted some ankle movement, did not demonstrate any knee extension nor hip flexion with much encouragement, does not hold the leg against gravity when lifted LLE Deficits / Details: similar to R with no boot.  Cervical / Trunk Assessment:  (uansble to hold balce, does sit at midline  with support at back in chair/bed position.)  Communication   Communication:  (difficult to understand at times, mucosa very dry.)  Cognition Arousal/Alertness: Awake/alert Behavior During Therapy:  (emotional.) Overall Cognitive Status: Impaired/Different from baseline Area of Impairment: Orientation;Following commands Orientation Level: Place;Time     Following Commands: Follows one step commands inconsistently       General Comments: patient repeated'I am lost". I just want to go to my house"    General Comments General comments (skin integrity, edema, etc.): unable to  attempt sitting. has L foot  dressings from previous vascular compromise.    Exercises General  Exercises - Lower Extremity Ankle Circles/Pumps: PROM;Both;10 reps;Seated Long Arc Quad: PROM;Both;10 reps;Seated      Assessment/Plan    PT Assessment Patient needs continued PT services  PT Diagnosis Generalized weakness;Acute pain;Altered mental status   PT Problem List Decreased strength;Decreased activity tolerance;Decreased balance;Decreased mobility;Decreased cognition;Cardiopulmonary status limiting activity;Decreased knowledge of precautions;Decreased safety awareness;Decreased knowledge of use of DME;Pain;Decreased skin integrity  PT Treatment Interventions Functional mobility training;Therapeutic activities;Therapeutic exercise;Balance training;Patient/family education   PT Goals (Current goals can be found in the Care Plan section) Acute Rehab PT Goals Patient Stated Goal: to go to my house PT Goal Formulation: Patient unable to participate in goal setting Time For Goal Achievement: 12/19/15 Potential to Achieve Goals: Fair    Frequency Min 3X/week   Barriers to discharge Decreased caregiver support      Co-evaluation               End of Session   Activity Tolerance: Patient limited by pain;Treatment limited secondary to agitation Patient left: in bed;with call bell/phone within reach;with bed alarm set Nurse Communication: Mobility status;Need for lift equipment         Time: [phone] PT Time Calculation (min) (ACUTE ONLY): 25 min   Charges:   PT Evaluation $PT Eval Moderate Complexity: 1 Procedure     PT G Codes:        Claretha Cooper 12/05/2015, 10:11 AM Tresa Endo PT [phone]

## 2015-12-05 NOTE — Progress Notes (Signed)
PARENTERAL NUTRITION CONSULT NOTE - FOLLOW-UP  Pharmacy Consult for TPN Indication: Bowel obstruction  Allergies  Allergen Reactions  . Zetia [Ezetimibe] Anaphylaxis and Swelling    Tongue and throat   . Atorvastatin Other (See Comments)    Malaise & muscle weakness  . Penicillins Other (See Comments)    Unknown.  Tolerates cefepime  . Pravastatin Sodium     Pravastatin 40 mg qday and 40 mg q M/W/F caused muscle aches  . Lisinopril Rash  . Promethazine Rash    Patient Measurements: Height: 6\' 2"  (188 cm) Weight: 153 lb 3.5 oz (69.5 kg) IBW/kg (Calculated) : 82.2 Usual Weight: 137-140 lbs  Vital Signs: Temp: 98.5 F (36.9 C) (01/10 0800) Temp Source: Oral (01/10 0800) BP: 168/92 mmHg (01/10 0700) Pulse Rate: 92 (01/10 0700) Intake/Output from previous day: 01/09 0701 - 01/10 0700 In: 3425 [I.V.:1800; IV Piggyback:150; TPN:825] Out: 2200 [Urine:1900; Emesis/NG output:300] Intake/Output from this shift:    Labs:  Recent Labs  12/04/15 0447  WBC 10.7*  HGB 11.0*  HCT 32.8*  PLT 133*     Recent Labs  12/03/15 0545 12/04/15 0447 12/05/15 0430  NA 141 141 142  K 3.8 4.4 4.4  CL 112* 115* 116*  CO2 21* 22 22  GLUCOSE 154* 161* 165*  BUN 39* 42* 41*  CREATININE 1.89* 1.[name]* 1.92*  CALCIUM 7.8* 8.4* 8.6*  MG 1.6* 2.0 1.8  PHOS 1.9* 3.4 3.4  PROT  --  4.6*  --   ALBUMIN  --  1.9*  --   AST  --  30  --   ALT  --  15*  --   ALKPHOS  --  66  --   BILITOT  --  0.5  --   PREALBUMIN  --  <2*  --   TRIG  --  164*  --    Estimated Creatinine Clearance: 38.7 mL/min (by C-G formula based on Cr of 1.92).    Recent Labs  12/04/15 2014 12/04/15 2353 12/05/15 0405  GLUCAP 182* 199* 140*    Medical History: Past Medical History  Diagnosis Date  . Hyperlipidemia   . Hypertension   . Leg pain   . Peripheral vascular disease ([place])     s/p prior Ao-Bifem BPG  . 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  Coronary artery disease     a. NSTEMI 7/14=> LHC 06/07/13: Proximal LAD 30%, ostial D1 30%, AV circumflex 80%, then occluded, ostial OM2 occluded, left to left collaterals, mid RCA 95%, inferior HK, EF 60%. PCI: Promus DES to the mid RCA.  No intervention was recommended for the CTO circumflex.  . Ischemic cardiomyopathy     a. Echocardiogram 06/06/13: EF 35-40%, inferior AK, mild MR.  Marland Kitchen Shortness of breath     "can come on at anytime lately; before that it was just w/exertion" (11/10/2013)  . Type II diabetes mellitus ([place])   . [place] mal seizure [facility])     "last one was ~ 3 yr ago; controlled w/daily RX" (11/10/2013)  . Arthritis     "neck, back, legs" (11/10/2013)  . Kidney stones     "when I was younger; passed w/o treatment" (11/10/2013)  . Myocardial infarction ([place])   . Diabetic neuropathy ([place])   . H. pylori infection   . Wears glasses   . NSTEMI (non-ST elevated myocardial infarction) ([place]) 06/05/2013  . Shoulder contusion 11/07/2013    Medications:  Infusions:  . sodium chloride 75 mL/hr at 12/05/15 0843  . Marland KitchenTPN (CLINIMIX-E) Adult 65 mL/hr at 12/04/15 1715   And  . fat emulsion 240 mL (12/04/15 1715)    Insulin Requirements in the past 24 hours: 14 units yesterday + 10 units Regular insulin in TPN  Current Nutrition: NPO  IVF: NS @ 75 ml/hr  Central access: PICC placed 1/3 TPN start date: 1/3  ASSESSMENT                                                                                                          HPI: [name] [name] presented to ED on 12/27 with abdominal pain.  PMH includes HTN, HLD, PVD - s/p fem bypass, CAD s/p NSTEMI 2014, cardiomyopathy, DM, diabetic peripheral neuropathy, seizures, and chronic pain on chronic opioids.  Surgical history significant for open AAA repair.  CT shows partial SBO, likely adhesions.  Patient refusing surgery.  Plan for conservative management with TPN, bowel rest.   Significant events:  1/1: TPN per pharmacy ordered - unable to start  without central access.  1/3: PICC line placed; TPN started 1/5: Acute encephalopathy with fevers (102F) overnight- sepsis protocol initiated.  Broad-spectrum abx started.  1/6: Consented to laparotomy w/ findings of perforated viscus and peritonitis. Txfer to ICU    Today 12/05/2015:   Glucose: several CBGs > goal of < 150mg /dl.  Insulin added to TPN on  1/6. Hx DM on glipizide PTA  Electrolytes - Mg/Phos WNL s/p replacement 1/8; Corr Ca at ULN; Cl- increased.  Renal - new AKI; SCr continues to rise but plateauing.  UOP improved- adequate  I/O -  + 1.2L, NGT = 360ml out (improved)  LFTs - AST/ALT and Tbili wnl  TGs - 124 (1/2), 164 (1/9)  Prealbumin - 3.7 (1/2), < 2 (1/9)  NUTRITIONAL GOALS                                                                                             RD recs: 1600-1800 Kcal/day, 65-80 g protein/day, > 2 L fluid/day  Clinimix E 5/15 at a goal rate of 65 ml/hr + 20% fat emulsion at 10 ml/hr to provide: 78 g/day protein, 1588 Kcal/day.  PLAN  Continue Clinimix E 5/15 at goal rate of 65 ml/h  Continue 20% fat emulsion at 10 ml/hr  Continue NS at 75 ml/hr per TRH  Standard MVI, MTE in TPN bag.  Continue q6h CBGs with moderate correction insulin.  Based on CBGs above goal and on SSI use, increase to 15 units regular insulin in TPN  TPN lab panels on Mondays & Thursdays.    Doreene Eland, PharmD, BCPS.   Pager: [phone] 12/05/2015 8:46 AM

## 2015-12-05 NOTE — Progress Notes (Signed)
[name] PROGRESS NOTE  [name] [name] [id number] DOB: 1951-05-15 DOA: 11/21/2015 PCP: Marijean Bravo, MD  Assessment/Plan:  65 year old man with a PMH of hyperlipidemia, hypertension, CAD, type 2 diabetes, diabetic peripheral neuropathy, seizure disorder, polysubstance abuse and chronic pain issues (on high doses of oxycodone prior to admission) who was admitted 11/22/15 with a small bowel obstruction. Surgical consultation was obtained on admission with conservative therapy initially recommended. His bowel obstruction was felt to be from adhesions and possible narcotic bowel. His NG tube was removed 11/23/15 and he seemed to be improving clinically, but pain returned 11/24/15 with diet advancement to clears. Repeat films showed worsening small bowel dilatation so his NG tube was reinserted. TPN was subsequently ordered. On 11/27/15, the patient was noted to be refusing PICC line insertion for TPN and surgical intervention. PICC finally placed 11/28/15 with initiation of TPN. On 11/30/15, the patient's condition deteriorated acutely and he was found to be confused and restless. Rapid response was called. He was febrile and tachycardic with an elevated lactate level. Due to concerns for sepsis, broad-spectrum antibiotics were initiated with vancomycin and Primaxin which was switched to cefepime given his seizure history. Underwent exploratory laparotomy on 12/01/15 with findings of perforated viscus with peritoneal contamination/peritonitis. He remained intubated postoperatively and was under the care of the critical care team. He self extubated 12/02/15. Surgery continues to follow, and has an open abdominal wound. He continues to have an ileus and is on TNA at this time. [name] will assume care 12/05/15.  SBO w/ perforated viscous and resultant peritonitis  Severe Protein calorie malnutrition - underweight, alb 1.9 Bowel rest Cont TPN Wound care per surgery  PRN Fentanyl   Severe  sepsis  BP stable.  Continue with IV antibiotics.   Post op resp failure s/p exploratory laparotomy.  vent support on 1/6 , extubation: 1-6 Patient on 15 L of oxygen. Will check chest x ray.  Monitor resp. Status closely  Peritonitis in setting of perforation d/t SBO  Severe sepsis  Dc vanc, ct cefepime  Acute Encephalopathy in setting of sepsis  H/o seizure d/o-Off AEDs X 2 mo before admit-->EEG 1/5 negative.  Watch closely for evidence of seizures.  If seizure activity would load Keppra.  AKI -UO improving Urine out put 1.9 L last 24 hours.  Follow cr trend.  Continue with IV fluids.   H/o cad  S/p left fem pop bypass w/ non-healing ulcer  Neutropenia - resolved Dilutional anemia w/expected post-op blood loss   Type II DM SSI.    Code Status: Full Code.  Family Communication: none at bedside, care discussed with patient.  Disposition Plan: Remain in the step down.    Consultants:  Surgery   CCM transfer care to [facility]   Procedures: exploratory laparotomy on 12/01/15   Antibiotics:  Flagyl 11-30-2015  Cefepime 12-02-2015  HPI/Subjective: Alert, ill appearing.  Complaining of abdominal pain.  Denies dyspnea.   Objective: Filed Vitals:   12/05/15 0600 12/05/15 0700  BP: 147/89 168/92  Pulse: 88 92  Temp:    Resp: 27 18    Intake/Output Summary (Last 24 hours) at 12/05/15 0739 Last data filed at 12/05/15 0600  Gross per 24 hour  Intake   3425 ml  Output   2200 ml  Net   1225 ml   Filed Weights   12/01/15 1500 12/02/15 0425 12/02/15 0651  Weight: 68.5 kg (151 lb 0.2 oz) 72.3 kg (159 lb 6.3 oz) 69.5 kg (153 lb 3.5  oz)    Exam:   General:  Chronic ill appearing. No acute distress  Cardiovascular: S 1, S 2 RRR  Respiratory: decrease breath sounds.   Abdomen: decrease Bowel sound, wound mid abdomen with clean dressing.   Musculoskeletal: scd , no LE edema. Right arm with swelling   Data Reviewed: Basic Metabolic  Panel:  Recent Labs Lab 12/01/15 0315 12/02/15 0508 12/02/15 0624 12/03/15 0545 12/04/15 0447 12/05/15 0430  NA 141 132* 142 141 141 142  K 3.8 5.5* 4.0 3.8 4.4 4.4  CL 108 105 111 112* 115* 116*  CO2 23 22 24  21* 22 22  GLUCOSE 167* 719* 169* 154* 161* 165*  BUN 16 29* 31* 39* 42* 41*  CREATININE 1.07 1.62* 1.66* 1.89* 1.88* 1.92*  CALCIUM 8.8* 7.7* 7.8* 7.8* 8.4* 8.6*  MG 2.0 2.2  --  1.6* 2.0 1.8  PHOS 2.3* 6.3*  --  1.9* 3.4 3.4   Liver Function Tests:  Recent Labs Lab 11/29/15 0452 11/30/15 0415 12/04/15 0447  AST 20 21 30   ALT 18 18 15*  ALKPHOS 58 57 66  BILITOT 1.1 0.9 0.5  PROT 5.8* 6.0* 4.6*  ALBUMIN 3.3* 3.2* 1.9*   No results for input(s): LIPASE, AMYLASE in the last 168 hours.  Recent Labs Lab 11/30/15 1020 12/01/15 0315  AMMONIA 69* 38*   CBC:  Recent Labs Lab 11/29/15 0452 11/30/15 0415 12/01/15 0315 12/02/15 0508 12/04/15 0447  WBC 6.6 1.4* 3.3* 5.7 10.7*  NEUTROABS 4.5  --  2.6  --  8.0*  HGB 11.6* 12.9* 16.1 12.4* 11.0*  HCT 36.4* 39.8 48.0 37.8* 32.8*  MCV 91.9 90.2 87.9 91.7 87.5  PLT PLATELET CLUMPS NOTED ON SMEAR, UNABLE TO ESTIMATE PLATELET CLUMPS NOTED ON SMEAR, UNABLE TO ESTIMATE PLATELET CLUMPS NOTED ON SMEAR, UNABLE TO ESTIMATE 123* 133*   Cardiac Enzymes:  Recent Labs Lab 11/30/15 1020  CKTOTAL 103   BNP (last 3 results) No results for input(s): BNP in the last 8760 hours.  ProBNP (last 3 results) No results for input(s): PROBNP in the last 8760 hours.  CBG:  Recent Labs Lab 12/04/15 1131 12/04/15 1511 12/04/15 2014 12/04/15 2353 12/05/15 0405  GLUCAP 104* 157* 182* 199* 140*    Recent Results (from the past 240 hour(s))  Culture, Urine     Status: None   Collection Time: 11/30/15  6:21 AM  Result Value Ref Range Status   Specimen Description URINE, CLEAN CATCH  Final   Special Requests NONE  Final   Culture   Final    NO GROWTH 1 DAY Performed at [facility]    Report Status 12/01/2015  FINAL  Final  Culture, blood (Routine X 2) w Reflex to ID Panel     Status: None (Preliminary result)   Collection Time: 11/30/15  6:31 AM  Result Value Ref Range Status   Specimen Description BLOOD LEFT ARM  Final   Special Requests IN PEDIATRIC BOTTLE 2CC  Final   Culture   Final    NO GROWTH 4 DAYS Performed at [facility]    Report Status PENDING  Incomplete  Culture, blood (Routine X 2) w Reflex to ID Panel     Status: None (Preliminary result)   Collection Time: 11/30/15  6:45 AM  Result Value Ref Range Status   Specimen Description BLOOD RIGHT PICC LINE  Final   Special Requests BOTTLES DRAWN AEROBIC AND ANAEROBIC 10CC  Final   Culture   Final    NO  GROWTH 4 DAYS Performed at [facility]    Report Status PENDING  Incomplete  MRSA PCR Screening     Status: None   Collection Time: 12/04/15  9:57 AM  Result Value Ref Range Status   MRSA by PCR NEGATIVE NEGATIVE Final    Comment:        The GeneXpert MRSA Assay (FDA approved for NASAL specimens only), is one component of a comprehensive MRSA colonization surveillance program. It is not intended to diagnose MRSA infection nor to guide or monitor treatment for MRSA infections.      Studies: No results found.  Scheduled Meds: . antiseptic oral rinse  7 mL Mouth Rinse q12n4p  . bacitracin   Topical Daily  . ceFEPime (MAXIPIME) IV  1 g Intravenous Q12H  . chlorhexidine  15 mL Mouth Rinse BID  . collagenase  1 application Topical Daily  . enoxaparin (LOVENOX) injection  40 mg Subcutaneous Q24H  . insulin aspart  0-15 Units Subcutaneous 6 times per day  . lip balm  1 application Topical BID  . metronidazole  500 mg Intravenous Q8H  . pantoprazole (PROTONIX) IV  40 mg Intravenous Q12H  . sodium chloride  3 mL Intravenous Q12H  . thiamine  100 mg Oral Daily   Or  . thiamine  100 mg Intravenous Daily   Continuous Infusions: . 0.9 % NaCl with KCl 20 mEq / L 75 mL/hr at 12/05/15 0600  . Marland KitchenTPN  (CLINIMIX-E) Adult 65 mL/hr at 12/04/15 1715   And  . 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     Niel Hummer A  [name] Pager [phone]. If 7PM-7AM, please contact night-coverage at [email or web address], password [email or web address] 12/05/2015, 7:40 AM  LOS: 14 days

## 2015-12-05 NOTE — Progress Notes (Signed)
Patient ID: [name] [name], male   DOB: [birth year], 65 y.o.   MRN: [id number]     [facility]      [address] [place], [place] [address]    Phone: [phone] FAX: [phone]     Subjective: Agitated.  abd pain.  sCr up to 1.9, good UOP.  No BMs recorded.   369m NGT Objective:  Vital signs:  Filed Vitals:   12/05/15 0400 12/05/15 0500 12/05/15 0600 12/05/15 0700  BP: 150/95 144/84 147/89 168/92  Pulse: 89 88 88 92  Temp: 99.4 F (37.4 C)     TempSrc: Axillary     Resp: _0 Height:      Weight:      SpO2: 97% 97% 93% 93%    Last BM Date: 12/02/15  Intake/Output   Yesterday:  01/09 0701 - 01/10 0700 In: 37124[I.V.:1800; IV Piggyback:150; TPN:825] Out: 2200 [Urine:1900; Emesis/NG output:300] This shift:    I/O last 3 completed shifts: In: 55809[I.V.:2700; Other:650; IV Piggyback:450] Out: 3150 [Urine:2600; Emesis/NG output:550]     Physical Exam: General: Pt awake/alert to self.   Abdomen: hypoactive BS, abdomen is soft, distended.  Midline wound is clean, fascia is intact, 2 retention sutures in place.   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[place]   Ischemic cardiomyopathy   Type II diabetes mellitus ([place]   Diabetic neuropathy ([place]   Noncompliance   Underweight    Results:   Labs: Results for orders placed or performed during the hospital encounter of 11/21/15 (from the past 48 hour(s))  Glucose, capillary     Status: Abnormal   Collection Time: 12/03/15 12:17 PM   Result Value Ref Range   Glucose-Capillary 113 (H) 65 - 99 mg/dL  Vancomycin, random     Status: None   Collection Time: 12/03/15  3:00 PM  Result Value Ref Range   Vancomycin Rm 17 ug/mL    Comment:        Random Vancomycin therapeutic range is dependent on dosage and time of specimen collection. A peak range is 20.0-40.0 ug/mL A trough range is 5.0-15.0 ug/mL          Glucose, capillary     Status: Abnormal   Collection Time: 12/03/15  4:28 PM  Result Value Ref Range   Glucose-Capillary 155 (H) 65 - 99 mg/dL  Glucose, capillary     Status: Abnormal   Collection Time: 12/03/15  8:00 PM  Result Value Ref Range   Glucose-Capillary 118 (H) 65 - 99 mg/dL  Glucose, capillary     Status: Abnormal   Collection Time: 12/04/15 12:57 AM  Result Value Ref Range   Glucose-Capillary 169 (H) 65 - 99 mg/dL  Glucose, capillary     Status: Abnormal   Collection Time: 12/04/15  4:06 AM  Result Value Ref Range   Glucose-Capillary 154 (H) 65 - 99 mg/dL  Comprehensive metabolic panel     Status: Abnormal   Collection Time: 12/04/15  4:47 AM  Result Value Ref Range  Sodium 141 135 - 145 mmol/L   Potassium 4.4 3.5 - 5.1 mmol/L   Chloride 115 (H) 101 - 111 mmol/L   CO2 22 22 - 32 mmol/L   Glucose, Bld 161 (H) 65 - 99 mg/dL   BUN 42 (H) 6 - 20 mg/dL   Creatinine, Ser 1.88 (H) 0.61 - 1.24 mg/dL   Calcium 8.4 (L) 8.9 - 10.3 mg/dL   Total Protein 4.6 (L) 6.5 - 8.1 g/dL   Albumin 1.9 (L) 3.5 - 5.0 g/dL   AST 30 15 - 41 U/L   ALT 15 (L) 17 - 63 U/L   Alkaline Phosphatase 66 38 - 126 U/L   Total Bilirubin 0.5 0.3 - 1.2 mg/dL   GFR calc non Af Amer 36 (L) >60 mL/min   GFR calc Af Amer 42 (L) >60 mL/min    Comment: (NOTE) The eGFR has been calculated using the CKD EPI equation. This calculation has not been validated in all clinical situations. eGFR's persistently <60 mL/min signify possible Chronic Kidney Disease.    Anion gap 4 (L) 5 - 15  Magnesium     Status: None   Collection Time:  12/04/15  4:47 AM  Result Value Ref Range   Magnesium 2.0 1.7 - 2.4 mg/dL  Phosphorus     Status: None   Collection Time: 12/04/15  4:47 AM  Result Value Ref Range   Phosphorus 3.4 2.5 - 4.6 mg/dL  CBC     Status: Abnormal   Collection Time: 12/04/15  4:47 AM  Result Value Ref Range   WBC 10.7 (H) 4.0 - 10.5 K/uL   RBC 3.75 (L) 4.22 - 5.81 MIL/uL   Hemoglobin 11.0 (L) 13.0 - 17.0 g/dL   HCT 32.8 (L) 39.0 - 52.0 %   MCV 87.5 78.0 - 100.0 fL   MCH 29.3 26.0 - 34.0 pg   MCHC 33.5 30.0 - 36.0 g/dL   RDW 15.5 11.5 - 15.5 %   Platelets 133 (L) 150 - 400 K/uL  Differential     Status: Abnormal   Collection Time: 12/04/15  4:47 AM  Result Value Ref Range   Neutrophils Relative % 75 %   Lymphocytes Relative 13 %   Monocytes Relative 8 %   Eosinophils Relative 3 %   Basophils Relative 1 %   Neutro Abs 8.0 (H) 1.7 - 7.7 K/uL   Lymphs Abs 1.4 0.7 - 4.0 K/uL   Monocytes Absolute 0.9 0.1 - 1.0 K/uL   Eosinophils Absolute 0.3 0.0 - 0.7 K/uL   Basophils Absolute 0.1 0.0 - 0.1 K/uL   RBC Morphology TARGET CELLS    WBC Morphology TOXIC GRANULATION     Comment: DOHLE BODIES VACUOLATED NEUTROPHILS    Smear Review LARGE PLATELETS PRESENT   Triglycerides     Status: Abnormal   Collection Time: 12/04/15  4:47 AM  Result Value Ref Range   Triglycerides 164 (H) <150 mg/dL    Comment: Performed at [facility]  Prealbumin     Status: Abnormal   Collection Time: 12/04/15  4:47 AM  Result Value Ref Range   Prealbumin <2 (L) 18 - 38 mg/dL    Comment: REPEATED TO VERIFY Performed at [facility]   Glucose, capillary     Status: Abnormal   Collection Time: 12/04/15  7:44 AM  Result Value Ref Range   Glucose-Capillary 115 (H) 65 - 99 mg/dL  MRSA PCR Screening     Status: None   Collection Time: 12/04/15  9:19 AM  Result Value Ref Range   MRSA by PCR NEGATIVE NEGATIVE    Comment:        The GeneXpert MRSA Assay (FDA approved for NASAL specimens only), is one component of  a comprehensive MRSA colonization surveillance program. It is not intended to diagnose MRSA infection nor to guide or monitor treatment for MRSA infections.   Glucose, capillary     Status: Abnormal   Collection Time: 12/04/15 11:31 AM  Result Value Ref Range   Glucose-Capillary 104 (H) 65 - 99 mg/dL  Glucose, capillary     Status: Abnormal   Collection Time: 12/04/15  3:11 PM  Result Value Ref Range   Glucose-Capillary 157 (H) 65 - 99 mg/dL  Glucose, capillary     Status: Abnormal   Collection Time: 12/04/15  8:14 PM  Result Value Ref Range   Glucose-Capillary 182 (H) 65 - 99 mg/dL   Comment 1 Notify RN    Comment 2 Document in Chart   Glucose, capillary     Status: Abnormal   Collection Time: 12/04/15 11:53 PM  Result Value Ref Range   Glucose-Capillary 199 (H) 65 - 99 mg/dL  Glucose, capillary     Status: Abnormal   Collection Time: 12/05/15  4:05 AM  Result Value Ref Range   Glucose-Capillary 140 (H) 65 - 99 mg/dL  Basic metabolic panel     Status: Abnormal   Collection Time: 12/05/15  4:30 AM  Result Value Ref Range   Sodium 142 135 - 145 mmol/L   Potassium 4.4 3.5 - 5.1 mmol/L   Chloride 116 (H) 101 - 111 mmol/L   CO2 22 22 - 32 mmol/L   Glucose, Bld 165 (H) 65 - 99 mg/dL   BUN 41 (H) 6 - 20 mg/dL   Creatinine, Ser 1.92 (H) 0.61 - 1.24 mg/dL   Calcium 8.6 (L) 8.9 - 10.3 mg/dL   GFR calc non Af Amer 35 (L) >60 mL/min   GFR calc Af Amer 41 (L) >60 mL/min    Comment: (NOTE) The eGFR has been calculated using the CKD EPI equation. This calculation has not been validated in all clinical situations. eGFR's persistently <60 mL/min signify possible Chronic Kidney Disease.    Anion gap 4 (L) 5 - 15  Magnesium     Status: None   Collection Time: 12/05/15  4:30 AM  Result Value Ref Range   Magnesium 1.8 1.7 - 2.4 mg/dL  Phosphorus     Status: None   Collection Time: 12/05/15  4:30 AM  Result Value Ref Range   Phosphorus 3.4 2.5 - 4.6 mg/dL    Imaging /  Studies: No results found.  Medications / Allergies:  Scheduled Meds: . antiseptic oral rinse  7 mL Mouth Rinse q12n4p  . bacitracin   Topical Daily  . ceFEPime (MAXIPIME) IV  1 g Intravenous Q12H  . chlorhexidine  15 mL Mouth Rinse BID  . collagenase  1 application Topical Daily  . enoxaparin (LOVENOX) injection  40 mg Subcutaneous Q24H  . insulin aspart  0-15 Units Subcutaneous 6 times per day  . lip balm  1 application Topical BID  . metronidazole  500 mg Intravenous Q8H  . pantoprazole (PROTONIX) IV  40 mg Intravenous Q12H  . sodium chloride  3 mL Intravenous Q12H  . thiamine  100 mg Oral Daily   Or  . thiamine  100 mg Intravenous Daily   Continuous Infusions: . sodium chloride    . Marland KitchenTPN (CLINIMIX-E)  Adult 65 mL/hr at 12/04/15 1715   And  . fat emulsion 240 mL (12/04/15 1715)   PRN Meds:.acetaminophen, albuterol, alum & mag hydroxide-simeth, bisacodyl, diphenhydrAMINE, haloperidol lactate, hydrALAZINE, HYDROmorphone (DILAUDID) injection, LORazepam **OR** LORazepam, magic mouthwash, menthol-cetylpyridinium, phenol, sodium chloride, triamcinolone cream  Antibiotics: Anti-infectives    Start     Dose/Rate Route Frequency Ordered Stop   12/03/15 1800  vancomycin (VANCOCIN) IVPB 750 mg/150 ml premix  Status:  Discontinued     750 mg 150 mL/hr over 60 Minutes Intravenous Every 24 hours 12/03/15 1715 12/04/15 0911   12/02/15 1800  ceFEPIme (MAXIPIME) 1 g in dextrose 5 % 50 mL IVPB     1 g 100 mL/hr over 30 Minutes Intravenous Every 12 hours 12/02/15 1014 12/09/15 1759   12/02/15 0600  vancomycin (VANCOCIN) IVPB 1000 mg/200 mL premix  Status:  Discontinued     1,000 mg 200 mL/hr over 60 Minutes Intravenous 3 times per day 12/02/15 0547 12/02/15 0954   12/01/15 2200  ceFEPIme (MAXIPIME) 1 g in dextrose 5 % 50 mL IVPB  Status:  Discontinued     1 g 100 mL/hr over 30 Minutes Intravenous 3 times per day 12/01/15 1455 12/02/15 1014   11/30/15 1400  ceFEPIme (MAXIPIME) 1 g in  dextrose 5 % 50 mL IVPB  Status:  Discontinued     1 g 100 mL/hr over 30 Minutes Intravenous 3 times per day 11/30/15 0913 12/01/15 1455   11/30/15 1000  metroNIDAZOLE (FLAGYL) IVPB 500 mg     500 mg 100 mL/hr over 60 Minutes Intravenous Every 8 hours 11/30/15 0858 12/07/15 0959   11/30/15 0645  vancomycin (VANCOCIN) IVPB 750 mg/150 ml premix  Status:  Discontinued     750 mg 150 mL/hr over 60 Minutes Intravenous 3 times per day 11/30/15 0630 12/02/15 0547   11/30/15 0645  imipenem-cilastatin (PRIMAXIN) 500 mg in sodium chloride 0.9 % 100 mL IVPB  Status:  Discontinued     500 mg 200 mL/hr over 30 Minutes Intravenous 3 times per day 11/30/15 0630 11/30/15 0859      Assessment/Plan POD#4 exlap, and repair of SB perforation in setting of SBO--Dr. Hassell Done 12/01/15  Post op ileus -await bowel function, NGT LIWS -pulmonary toilet -BID wet to dry dressing changes to open wound/retention sutures ID-cefepime/flagyl D#6 VTE prophylaxis-SCD/lovenox PCM-TPN Dispo-SDU  Erby Pian, ANP-BC [facility] Pager [phone](7A-4:30P)   12/05/2015 8:37 AM  Agree with above.  Alphonsa Overall, MD, [facility] Pager: [phone] Office phone:  [phone]

## 2015-12-05 NOTE — Progress Notes (Signed)
PULMONARY / CRITICAL CARE MEDICINE   Name: [name] [name] MRN: [id number] DOB: 15-Aug-1951    ADMISSION DATE:  11/21/2015 CONSULTATION DATE:  1/6  REFERRING MD:  Hassell Done   CHIEF COMPLAINT:    HISTORY OF PRESENT ILLNESS:   65 year old aam who was admitted on 12/27 w/ abd pain found to be 2/2 SBO on CT scan. Initially managed conservatively, but then on 1/1 was offered surgery given no significant improvement. He refused surgery for several days. His hospital course was complicated by fever spike and delirium on 1/5. At that time has abd was more distended. He had an EEG which was did not show seizures. He finally agreed to surgery on 1/6 d/t no resolution of abd pain. He went for exploratory lap which was notable for significant peritoneal contamination. He is s/p exploration, washout and repair of perforated viscous. PCCM was asked to assess post op and assist w/ post op critical care support.   PMH of  hypertension, CAD, type 2 diabetes, diabetic peripheral neuropathy, seizure disorder, polysubstance abuse and chronic pain issues   Significant tests/ events  SUBJECTIVE:  'take me home' UO low Afebrile Remains on high flow O2  VITAL SIGNS: BP 161/95 mmHg  Pulse 88  Temp(Src) 98.5 F (36.9 C) (Oral)  Resp 22  Ht 6\' 2"  (1.88 m)  Wt 153 lb 3.5 oz (69.5 kg)  BMI 19.66 kg/m2  SpO2 94%  HEMODYNAMICS:    VENTILATOR SETTINGS:    INTAKE / OUTPUT: I/O last 3 completed shifts: In: [id number] [I.V.:2700; Other:650; IV Piggyback:450] Out: 3150 [Urine:2600; Emesis/NG output:550]  PHYSICAL EXAMINATION: General:  Chronically ill appearing AAM.  Neuro:  Awake, interactive, confused, oriented to name & place Generalized weakness  HEENT:  NCAT, NRB Cardiovascular:  Tachy rrr; no MRG Lungs:  Clear, equal rise on vent. Crackles bases  Abdomen:  Distended. Mid abd dressing CD&I Musculoskeletal:  Equal st and bulk  Skin:  Dry and intact. LLE non healing ulcer dressing    LABS:  BMET  Recent Labs Lab 12/03/15 0545 12/04/15 0447 12/05/15 0430  NA 141 141 142  K 3.8 4.4 4.4  CL 112* 115* 116*  CO2 21* 22 22  BUN 39* 42* 41*  CREATININE 1.89* 1.88* 1.92*  GLUCOSE 154* 161* 165*    Electrolytes  Recent Labs Lab 12/03/15 0545 12/04/15 0447 12/05/15 0430  CALCIUM 7.8* 8.4* 8.6*  MG 1.6* 2.0 1.8  PHOS 1.9* 3.4 3.4    CBC  Recent Labs Lab 12/01/15 0315 12/02/15 0508 12/04/15 0447  WBC 3.3* 5.7 10.7*  HGB 16.1 12.4* 11.0*  HCT 48.0 37.8* 32.8*  PLT PLATELET CLUMPS NOTED ON SMEAR, UNABLE TO ESTIMATE 123* 133*    Coag's No results for input(s): APTT, INR in the last 168 hours.  Sepsis Markers  Recent Labs Lab 11/30/15 0645 11/30/15 1020 11/30/15 1300  LATICACIDVEN 2.3* 1.8 1.8    ABG  Recent Labs Lab 12/01/15 1527 12/02/15 0115  PHART 7.330* 7.311*  PCO2ART 39.1 40.4  PO2ART 209* 81.7    Liver Enzymes  Recent Labs Lab 11/29/15 0452 11/30/15 0415 12/04/15 0447  AST 20 21 30   ALT 18 18 15*  ALKPHOS 58 57 66  BILITOT 1.1 0.9 0.5  ALBUMIN 3.3* 3.2* 1.9*    Cardiac Enzymes No results for input(s): TROPONINI, PROBNP in the last 168 hours.  Glucose  Recent Labs Lab 12/04/15 1131 12/04/15 1511 12/04/15 2014 12/04/15 2353 12/05/15 0405 12/05/15 0756  GLUCAP 104* 157* 182* 199* 140* 133*  Imaging Dg Chest Port 1 View  12/05/2015  CLINICAL DATA:  Small bowel obstruction, acute onset of hypoxia ; history of diabetes, coronary artery disease with stent placement, peripheral vascular disease, current smoker. EXAM: PORTABLE CHEST 1 VIEW COMPARISON:  Chest x-ray of December 03, 2015 FINDINGS: Acute worsening of pulmonary interstitial density has occurred bilaterally. The right hemidiaphragm is now obscured. There is partial obscuration of the left hemidiaphragm. There are small bilateral pleural effusions. The heart is not enlarged. The pulmonary vascularity is not clearly engorged. The right-sided PICC line  tip projects over the distal third of the SVC. The esophagogastric tube tip projects below the inferior margin of the image. IMPRESSION: Worsening of pulmonary interstitial infiltrates bilaterally. This may reflect pulmonary edema of cardiac or noncardiac cause or could reflect pneumonia. There are small bilateral pleural effusions. Electronically Signed   By: David  Martinique M.D.   On: 12/05/2015 08:52     STUDIES:  1/5 EEG: no seizures. C/w metabolic changes/meds  CULTURES: BCX2 1/5>>>ng  ANTIBIOTICS: Cefepime 1/5>>> Flagyl 1/5>>> vanc 1/4>>> 1/9 Imipenem 1/4>>>1/5 (stopped d/t concern for seizure)    LINES/TUBES: RUE PICC 1/3>>> OETT 1/6>>>1/6   DISCUSSION: 65 year old male admitted 12/27 for SBO. Initially managed medically but when sxs not improved surgery was recommended on 1/1; the pt refused @ that time. He presents to the ICU on full vent support on 1/6 after abd exploration where he was found to have perforated viscous and peritonitis. He underwent washout & repair.   ASSESSMENT / PLAN:  PULMONARY A: POst op resp failure s/p exploratory laparotomy , continues to require high O2 - ? Edema vs atelectasis H/o nicotine abuse  P:  Ct high flow [place] diurese   CARDIOVASCULAR A:  Severe sepsis  H/o cad S/p left fem pop bypass w/ non-healing ulcer  P:  Resume ASA when ok w/surg Tele Wound care to LLE   RENAL A:   AKI -UO improving P:   dc IVFs @ 75/h Renal dose meds  Lasix 80 x 1  GASTROINTESTINAL A:   SBO w/ perforated viscous and resultant peritonitis  Ileus Severe Protein calorie malnutrition - underweight, alb 1.9 P:   Bowel rest Cont TPN Wound care per surgery  PRN Fentanyl   HEMATOLOGIC A:   Neutropenia - resolved Dilutional anemia w/expected post-op blood loss  P:  Trend CBC [place] LMWH   INFECTIOUS A:   Peritonitis in setting of perforation d/t SBO Severe sepsis  P:   Dc vanc, ct cefepime x 10-14 days   ENDOCRINE A:   Type II  DM  P:   ssi   NEUROLOGIC A:   Acute Encephalopathy in setting of sepsis  H/o seizure d/o-Off AEDs X 2 mo before admit-->EEG 1/5 negative. Severe deconditioning  P:   Try to mobilise with PT Watch closely for evidence of seizures.  If seizure activity would load Keppra   FAMILY  - Updates: none available  - Inter-disciplinary family meet or Palliative Care meeting due by:  1/13  Summary -Expect prolonged course with peritonitis, on bowel rest , delerium & oliguria , persistent hypoxia - improving, are present concerns    The patient is critically ill with multiple organ systems failure and requires high complexity decision making for assessment and support, frequent evaluation and titration of therapies, application of advanced monitoring technologies and extensive interpretation of multiple databases. Critical Care Time devoted to patient care services described in this note independent of APP time is 31 minutes.  Rigoberto Noel MD   12/05/2015, 9:19 AM

## 2015-12-06 ENCOUNTER — Inpatient Hospital Stay (HOSPITAL_COMMUNITY): Payer: Medicaid Other

## 2015-12-06 DIAGNOSIS — I82A11 Acute embolism and thrombosis of right axillary vein: Secondary | ICD-10-CM | POA: Diagnosis present

## 2015-12-06 DIAGNOSIS — R609 Edema, unspecified: Secondary | ICD-10-CM

## 2015-12-06 DIAGNOSIS — J9601 Acute respiratory failure with hypoxia: Secondary | ICD-10-CM

## 2015-12-06 DIAGNOSIS — I808 Phlebitis and thrombophlebitis of other sites: Secondary | ICD-10-CM

## 2015-12-06 LAB — GLUCOSE, CAPILLARY
GLUCOSE-CAPILLARY: 144 mg/dL — AB (ref 65–99)
GLUCOSE-CAPILLARY: 144 mg/dL — AB (ref 65–99)
GLUCOSE-CAPILLARY: 156 mg/dL — AB (ref 65–99)
GLUCOSE-CAPILLARY: 135 mg/dL — AB (ref 65–99)
Glucose-Capillary: 128 mg/dL — ABNORMAL HIGH (ref 65–99)
Glucose-Capillary: 130 mg/dL — ABNORMAL HIGH (ref 65–99)
Glucose-Capillary: 136 mg/dL — ABNORMAL HIGH (ref 65–99)

## 2015-12-06 LAB — CBC
HCT: 31.8 % — ABNORMAL LOW (ref 39.0–52.0)
HEMATOCRIT: 32.4 % — AB (ref 39.0–52.0)
HEMOGLOBIN: 11 g/dL — AB (ref 13.0–17.0)
Hemoglobin: 11.1 g/dL — ABNORMAL LOW (ref 13.0–17.0)
MCH: 29 pg (ref 26.0–34.0)
MCH: 29.3 pg (ref 26.0–34.0)
MCHC: 34.3 g/dL (ref 30.0–36.0)
MCHC: 34.6 g/dL (ref 30.0–36.0)
MCV: 84.6 fL (ref 78.0–100.0)
MCV: 84.6 fL (ref 78.0–100.0)
PLATELETS: 110 10*3/uL — AB (ref 150–400)
Platelets: 118 10*3/uL — ABNORMAL LOW (ref 150–400)
RBC: 3.83 MIL/uL — AB (ref 4.22–5.81)
RBC: 3.76 MIL/uL — AB (ref 4.22–5.81)
RDW: 15.1 % (ref 11.5–15.5)
RDW: 15.1 % (ref 11.5–15.5)
WBC: 15.2 10*3/uL — AB (ref 4.0–10.5)
WBC: 14.6 10*3/uL — AB (ref 4.0–10.5)

## 2015-12-06 LAB — APTT: APTT: 45 s — AB (ref 24–37)

## 2015-12-06 LAB — BASIC METABOLIC PANEL
ANION GAP: 8 (ref 5–15)
BUN: 49 mg/dL — ABNORMAL HIGH (ref 6–20)
CO2: 24 mmol/L (ref 22–32)
Calcium: 8.5 mg/dL — ABNORMAL LOW (ref 8.9–10.3)
Chloride: 108 mmol/L (ref 101–111)
Creatinine, Ser: 1.92 mg/dL — ABNORMAL HIGH (ref 0.61–1.24)
GFR, EST AFRICAN AMERICAN: 41 mL/min — AB (ref >60)
GFR, EST NON AFRICAN AMERICAN: 35 mL/min — AB (ref >60)
GLUCOSE: 134 mg/dL — AB (ref 65–99)
POTASSIUM: 4.1 mmol/L (ref 3.5–5.1)
Sodium: 140 mmol/L (ref 135–145)

## 2015-12-06 LAB — PROTIME-INR
INR: 1.89 — ABNORMAL HIGH (ref 0.00–1.49)
PROTHROMBIN TIME: 21.6 s — AB (ref 11.6–15.2)

## 2015-12-06 LAB — MAGNESIUM: Magnesium: 1.8 mg/dL (ref 1.7–2.4)

## 2015-12-06 MED ORDER — HEPARIN (PORCINE) IN NACL 100-0.45 UNIT/ML-% IJ SOLN
1150.0000 [IU]/h | INTRAMUSCULAR | Status: DC
  Administered 2015-12-06: 1150 [IU]/h via INTRAVENOUS
  Filled 2015-12-06: qty 250

## 2015-12-06 MED ORDER — BISACODYL 10 MG RE SUPP
10.0000 mg | Freq: Once | RECTAL | Status: AC
  Administered 2015-12-06: 10 mg via RECTAL
  Filled 2015-12-06: qty 1

## 2015-12-06 MED ORDER — TRACE MINERALS CR-CU-MN-SE-ZN 10-1000-500-60 MCG/ML IV SOLN
INTRAVENOUS | Status: AC
  Administered 2015-12-06: 17:00:00 via INTRAVENOUS
  Filled 2015-12-06: qty 1560

## 2015-12-06 MED ORDER — FAT EMULSION 20 % IV EMUL
240.0000 mL | INTRAVENOUS | Status: AC
  Administered 2015-12-06: 240 mL via INTRAVENOUS
  Filled 2015-12-06: qty 250

## 2015-12-06 NOTE — Progress Notes (Signed)
Patient ID: [name] [name], male   DOB: March 08, 1951, 65 y.o.   MRN: [id number]     [facility]      [address] [place], [place] [address]    Phone: [phone] FAX: [phone]     Subjective: No n/v.  NGT pulled out yesterday. Passing flatus. No BM.   Good UOP. WBC up to 15, afebrile.   Objective:  Vital signs:  Filed Vitals:   12/06/15 0300 12/06/15 0400 12/06/15 0500 12/06/15 0600  BP: 159/100 158/97 148/91 159/92  Pulse: 93 96 98 97  Temp:  98.9 F (37.2 C)    TempSrc:  Axillary    Resp: 23 22 20 20   Height:      Weight:  72 kg (158 lb 11.7 oz)    SpO2: 93% 94% 93% 95%    Last BM Date: 12/02/15  Intake/Output   Yesterday:  01/10 0701 - 01/11 0700 In: 2270 [I.V.:220; IV Piggyback:400; TPN:1650] Out: 7000 [Urine:5700; Emesis/NG output:1300] This shift:     Physical Exam: General: Pt awake/alert to self.   Abdomen: +BS, abdomen is soft, nondistended. Midline wound is clean, fascia is intact, 2 retention sutures in place.   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 It is not intended to diagnose MRSA infection nor to guide or monitor treatment for MRSA infections.   Glucose, capillary     Status: Abnormal   Collection Time: 12/04/15 11:31 AM  Result Value Ref Range   Glucose-Capillary 104 (H) 65 - 99 mg/dL  Glucose, capillary     Status: Abnormal   Collection Time: 12/04/15  3:11 PM  Result Value Ref Range   Glucose-Capillary 157 (H) 65 - 99 mg/dL  Glucose, capillary     Status: Abnormal   Collection Time: 12/04/15  8:14 PM  Result Value Ref Range   Glucose-Capillary 182 (H) 65 - 99 mg/dL   Comment 1 Notify RN    Comment 2 Document in Chart   Glucose, capillary     Status: Abnormal   Collection Time: 12/04/15 11:53 PM  Result Value Ref Range   Glucose-Capillary 199 (H) 65 - 99 mg/dL  Glucose, capillary     Status: Abnormal   Collection Time: 12/05/15  4:05 AM  Result Value Ref Range   Glucose-Capillary 140 (H) 65 - 99 mg/dL  Basic metabolic panel     Status: Abnormal   Collection Time: 12/05/15  4:30 AM  Result Value Ref Range   Sodium 142 135 - 145 mmol/L   Potassium 4.4 3.5 - 5.1 mmol/L   Chloride 116 (H) 101 - 111 mmol/L   CO2 22 22 - 32 mmol/L   Glucose, Bld 165 (H) 65 - 99 mg/dL   BUN 41 (H) 6 - 20 mg/dL   Creatinine, Ser 1.92 (H) 0.61 - 1.24 mg/dL   Calcium 8.6 (L) 8.9 - 10.3 mg/dL   GFR calc non Af Amer 35 (L) >60 mL/min   GFR calc Af Amer 41 (L) >60 mL/min    Comment: (NOTE) The eGFR has been calculated using the CKD EPI equation. This calculation has not been validated in all clinical situations. eGFR's persistently <60 mL/min signify possible Chronic Kidney Disease.    Anion gap 4 (L) 5 - 15  Magnesium     Status: None   Collection Time: 12/05/15  4:30 AM  Result Value Ref Range   Magnesium 1.8 1.7 - 2.4 mg/dL  Phosphorus     Status: None   Collection Time: 12/05/15  4:30 AM  Result Value Ref  Range   Phosphorus 3.4 2.5 - 4.6 mg/dL  Glucose, capillary     Status: Abnormal   Collection Time: 12/05/15  7:56 AM  Result Value Ref Range   Glucose-Capillary 133 (H) 65 - 99 mg/dL   Comment 1 Notify RN    Comment 2 Document in Chart   Glucose, capillary     Status: None   Collection Time: 12/05/15  1:09 PM  Result Value Ref Range   Glucose-Capillary 95 65 - 99 mg/dL   Comment 1 Notify RN    Comment 2 Document in Chart   Glucose, capillary     Status: Abnormal   Collection Time: 12/05/15  4:34 PM  Result Value Ref Range   Glucose-Capillary 130 (H) 65 - 99 mg/dL   Comment 1 Notify RN    Comment 2 Document in Chart   Glucose, capillary     Status: Abnormal   Collection Time: 12/05/15  8:18 PM  Result Value Ref Range   Glucose-Capillary 145 (H) 65 - 99 mg/dL   Comment 1 Notify RN    Comment 2 Document in Chart   CBC     Status: Abnormal   Collection Time: 12/06/15  6:10 AM  Result Value Ref Range   WBC 15.2 (H) 4.0 - 10.5 K/uL   RBC 3.83 (L) 4.22 - 5.81 MIL/uL   Hemoglobin 11.1 (L) 13.0 - 17.0 g/dL   HCT 32.4 (L) 39.0 - 52.0 %   MCV 84.6 78.0 - 100.0 fL   MCH 29.0 26.0 - 34.0 pg   MCHC 34.3 30.0 - 36.0 g/dL   RDW 15.1 11.5 - 15.5 %   Platelets 110 (L) 150 - 400 K/uL    Comment: SPECIMEN CHECKED FOR CLOTS PLATELET COUNT CONFIRMED BY SMEAR LARGE PLATELETS PRESENT   Basic metabolic panel     Status: Abnormal   Collection Time: 12/06/15  6:10 AM  Result Value Ref Range   Sodium 140 135 - 145 mmol/L   Potassium 4.1 3.5 - 5.1 mmol/L   Chloride 108 101 - 111 mmol/L   CO2 24 22 - 32 mmol/L   Glucose, Bld 134 (H) 65 - 99 mg/dL   BUN 49 (H) 6 - 20 mg/dL   Creatinine, Ser 1.92 (H) 0.61 - 1.24 mg/dL   Calcium 8.5 (L) 8.9 - 10.3 mg/dL   GFR calc non Af [place] Lora  35 (L) >60 mL/min   GFR calc Af Amer 41 (L) >60 mL/min    Comment: (NOTE) The eGFR has been calculated using the CKD EPI equation. This calculation has not been validated in all clinical situations. eGFR's persistently  <60 mL/min signify possible Chronic Kidney Disease.    Anion gap 8 5 - 15  Magnesium     Status: None   Collection Time: 12/06/15  6:10 AM  Result Value Ref Range   Magnesium 1.8 1.7 - 2.4 mg/dL    Imaging / Studies: Dg Chest Port 1 View  12/05/2015  CLINICAL DATA:  Small bowel obstruction, acute onset of hypoxia ; history of diabetes, coronary artery disease with stent placement, peripheral vascular disease, current smoker. EXAM: PORTABLE CHEST 1 VIEW COMPARISON:  Chest x-ray of December 03, 2015 FINDINGS: Acute worsening of pulmonary interstitial density has occurred bilaterally. The right hemidiaphragm is now obscured. There is partial obscuration of the left hemidiaphragm. There are small bilateral pleural effusions. The heart is not enlarged. The pulmonary vascularity is not clearly engorged. The right-sided PICC line tip projects over the distal third of the SVC. The esophagogastric tube tip projects below the inferior margin of the image. IMPRESSION: Worsening of pulmonary interstitial infiltrates bilaterally. This may reflect pulmonary edema of cardiac or noncardiac cause or could reflect pneumonia. There are small bilateral pleural effusions. Electronically Signed   By: David  Martinique M.D.   On: 12/05/2015 08:52    Medications / Allergies:  Scheduled Meds: . antiseptic oral rinse  7 mL Mouth Rinse q12n4p  . bacitracin   Topical Daily  . bisacodyl  10 mg Rectal Once  . ceFEPime (MAXIPIME) IV  1 g Intravenous Q12H  . chlorhexidine  15 mL Mouth Rinse BID  . collagenase  1 application Topical Daily  . enoxaparin (LOVENOX) injection  40 mg Subcutaneous Q24H  . insulin aspart  0-15 Units Subcutaneous 6 times per day  . lip balm  1 application Topical BID  . metronidazole  500 mg Intravenous Q8H  . pantoprazole (PROTONIX) IV  40 mg Intravenous Q12H  . sodium chloride  3 mL Intravenous Q12H  . thiamine  100 mg Oral Daily   Or  . thiamine  100 mg Intravenous Daily   Continuous  Infusions: . Marland KitchenTPN (CLINIMIX-E) Adult 65 mL/hr at 12/05/15 1716   And  . fat emulsion 240 mL (12/05/15 1716)   PRN Meds:.acetaminophen, albuterol, alum & mag hydroxide-simeth, bisacodyl, diphenhydrAMINE, haloperidol lactate, hydrALAZINE, HYDROmorphone (DILAUDID) injection, magic mouthwash, menthol-cetylpyridinium, phenol, sodium chloride, triamcinolone cream  Antibiotics: Anti-infectives    Start     Dose/Rate Route Frequency Ordered Stop   12/03/15 1800  vancomycin (VANCOCIN) IVPB 750 mg/150 ml premix  Status:  Discontinued     750 mg 150 mL/hr over 60 Minutes Intravenous Every 24 hours 12/03/15 1715 12/04/15 0911   12/02/15 1800  ceFEPIme (MAXIPIME) 1 g in dextrose 5 % 50 mL IVPB     1 g 100 mL/hr over 30 Minutes Intravenous Every 12 hours 12/02/15 1014 12/09/15 1759   12/02/15 0600  vancomycin (VANCOCIN) IVPB 1000 mg/200 mL premix  Status:  Discontinued     1,000 mg 200 mL/hr over 60 Minutes Intravenous 3 times per day 12/02/15 0547 12/02/15 0954   12/01/15 2200  ceFEPIme (MAXIPIME) 1 g in dextrose 5 % 50 mL IVPB  Status:  Discontinued     1 g 100 mL/hr over 30 Minutes Intravenous 3 times per day 12/01/15 1455 12/02/15 1014   11/30/15  1400  ceFEPIme (MAXIPIME) 1 g in dextrose 5 % 50 mL IVPB  Status:  Discontinued     1 g 100 mL/hr over 30 Minutes Intravenous 3 times per day 11/30/15 0913 12/01/15 1455   11/30/15 1000  metroNIDAZOLE (FLAGYL) IVPB 500 mg     500 mg 100 mL/hr over 60 Minutes Intravenous Every 8 hours 11/30/15 0858 12/07/15 0959   11/30/15 0645  vancomycin (VANCOCIN) IVPB 750 mg/150 ml premix  Status:  Discontinued     750 mg 150 mL/hr over 60 Minutes Intravenous 3 times per day 11/30/15 0630 12/02/15 0547   11/30/15 0645  imipenem-cilastatin (PRIMAXIN) 500 mg in sodium chloride 0.9 % 100 mL IVPB  Status:  Discontinued     500 mg 200 mL/hr over 30 Minutes Intravenous 3 times per day 11/30/15 0630 11/30/15 0859       Assessment/Plan POD#5 exlap, and repair of SB  perforation in setting of SBO--Dr. Hassell Done 12/01/15 Post op ileus -NGT out, +BS, allow for sips from the floor.  Give dulcolax suppository x1.  -pulmonary toilet(refusing) -BID wet to dry dressing changes to open wound/retention sutures ID-cefepime/flagyl D#7.  WBC up to 15k, afebrile, monitor.  VTE prophylaxis-SCD/lovenox PCM-TPN Dispo-may transfer to floor from a surgical standpoint    Erby Pian, [facility] Surgery Pager [phone])  12/06/2015 8:02 AM

## 2015-12-06 NOTE — Progress Notes (Signed)
Pharmacy Antibiotic Follow-up Note  [name] [name] is a 65 y.o. year-old male admitted on 11/21/2015.  The patient is currently on day #7 of cefepime/flagyl for intra-abdominal infection. WBC is trending up,  No fevers, renal function elevated but stable  Assessment/Plan:  Cefepime 1gm IV q12h adjusted for renal function remains appropriate  Stop date of 1/14 in place for cefepime  Temp (24hrs), Avg:98.7 F (37.1 C), Min:98.2 F (36.8 C), Max:98.9 F (37.2 C)   Recent Labs Lab 11/30/15 0415 12/01/15 0315 12/02/15 0508 12/04/15 0447 12/06/15 0610  WBC 1.4* 3.3* 5.7 10.7* 15.2*    Recent Labs Lab 12/02/15 0624 12/03/15 0545 12/04/15 0447 12/05/15 0430 12/06/15 0610  CREATININE 1.66* 1.89* 1.88* 1.92* 1.92*   Estimated Creatinine Clearance: 40.1 mL/min (by C-G formula based on Cr of 1.92).    Allergies  Allergen Reactions  . Zetia [Ezetimibe] Anaphylaxis and Swelling    Tongue and throat   . Atorvastatin Other (See Comments)    Malaise & muscle weakness  . Penicillins Other (See Comments)    Unknown.  Tolerates cefepime  . Pravastatin Sodium     Pravastatin 40 mg qday and 40 mg q M/W/F caused muscle aches  . Lisinopril Rash  . Promethazine Rash    Antimicrobials this admission:  1/5>> vanc >> 1/9  1/5>> primaxin >>1/5 (d/c'd due to seizure hx- concern for sz activity this admit although EEG negative)  1/5>>cefepime>>  1/5>>flagyl>>   Levels/dose changes this admission:  1/6 VT sl low at 14, increased to 1000 mg q8h  1/7: VT 29, vanc held, Cefepime reduced to 1g IV q12h.  1/8: VRm 17, 24 hr after previous trough   Microbiology results:  1/5 Blood x2: ngtd  1/5 Urine: ngf  Thank you for allowing pharmacy to be a part of this patient's care.  Doreene Eland, PharmD, BCPS.   Pager: [phone] 12/06/2015 8:28 AM

## 2015-12-06 NOTE — Procedures (Signed)
Central Venous Catheter Insertion Procedure Note [name] [name] [id number] 04-04-1951  Procedure: Insertion of Central Venous Catheter Indications: Assessment of intravascular volume, Drug and/or fluid administration and Frequent blood sampling  Procedure Details Consent: Risks of procedure as well as the alternatives and risks of each were explained to the (patient/caregiver).  Consent for procedure obtained.   Time Out: Verified patient identification, verified procedure, site/side was marked, verified correct patient position, special equipment/implants available, medications/allergies/relevent history reviewed, required imaging and test results available.  Performed  Maximum sterile technique was used including antiseptics, cap, gloves, gown, hand hygiene, mask and sheet. Skin prep: Chlorhexidine; local anesthetic administered A antimicrobial bonded/coated triple lumen catheter was placed in the left internal jugular vein using the Seldinger technique.  Evaluation Blood flow good Complications: No apparent complications Patient did tolerate procedure well. Chest X-ray ordered to verify placement.  CXR: pending.   Procedure performed under direct supervision of Dr. Elsworth Soho and with ultrasound guidance for real time vessel cannulation.      [name] Gens, NP-C [facility] Pgr: [phone] or if no answer [phone] 12/06/2015, 4:06 PM

## 2015-12-06 NOTE — Anesthesia Postprocedure Evaluation (Signed)
Anesthesia Post Note  Patient: [name] [name]  Procedure(s) Performed: Procedure(s) (LRB): EXPLORATORY LAPAROTOMY WITH RADICAL PERITONEAL DEBRIDEMENT, CLOSURE OF SMALL BOWEL PERFORATION AND LYSIS OF ADHESIONS. (N/A)  Patient location during evaluation: PACU Anesthesia Type: General Level of consciousness: sedated and patient remains intubated per anesthesia plan Pain management: pain level controlled Vital Signs Assessment: post-procedure vital signs reviewed and stable Respiratory status: respiratory function stable, patient connected to nasal cannula oxygen, patient remains intubated per anesthesia plan, patient connected to T-piece oxygen and patient on ventilator - see flowsheet for VS Cardiovascular status: blood pressure returned to baseline and stable Postop Assessment: no signs of nausea or vomiting Anesthetic complications: no    Last Vitals:  Filed Vitals:   12/06/15 1200 12/06/15 1300  BP: 152/89 161/87  Pulse: 89 96  Temp:  36.4 C  Resp: 23 23    Last Pain:  Filed Vitals:   12/06/15 1345  PainSc: Asleep                 Aliyana Dlugosz,JAMES TERRILL

## 2015-12-06 NOTE — Progress Notes (Signed)
PULMONARY / CRITICAL CARE MEDICINE   Name: [name] [name] MRN: [id number] DOB: July 13, 1951    ADMISSION DATE:  11/21/2015 CONSULTATION DATE:  1/6  REFERRING MD:  Hassell Done   CHIEF COMPLAINT:    HISTORY OF PRESENT ILLNESS:   65 year old aam who was admitted on 12/27 w/ abd pain found to be 2/2 SBO on CT scan. Initially managed conservatively, but then on 1/1 was offered surgery given no significant improvement. He refused surgery for several days. His hospital course was complicated by fever spike and delirium on 1/5. At that time has abd was more distended. He had an EEG which was did not show seizures. He finally agreed to surgery on 1/6 d/t no resolution of abd pain. He went for exploratory lap which was notable for significant peritoneal contamination. He is s/p exploration, washout and repair of perforated viscous. PCCM was asked to assess post op and assist w/ post op critical care support.   PMH of  hypertension, CAD, type 2 diabetes, diabetic peripheral neuropathy, seizure disorder, polysubstance abuse and chronic pain issues   Significant tests/ events 1/10 diuresed 5L with lasix 1/11 duplex >. RUE DVT along PICC   SUBJECTIVE:  UO picked up with lasix Afebrile on lower O2  VITAL SIGNS: BP 153/88 mmHg  Pulse 86  Temp(Src) 98.9 F (37.2 C) (Axillary)  Resp 21  Ht 6\' 2"  (1.88 m)  Wt 158 lb 11.7 oz (72 kg)  BMI 20.37 kg/m2  SpO2 96%  HEMODYNAMICS:    VENTILATOR SETTINGS:    INTAKE / OUTPUT: I/O last 3 completed shifts: In: [id number] [I.V.:2020; IV Piggyback:550] Out: 72 [Urine:7600; Emesis/NG output:1600]  PHYSICAL EXAMINATION: General:  Chronically ill appearing AAM.  Neuro:  Awake, interactive, confused, oriented to name & place Generalized weakness  HEENT:  NCAT, NRB Cardiovascular:  Tachy rrr; no MRG Lungs:  Clear, equal rise on vent. Crackles bases  Abdomen:  Distended. Mid abd dressing CD&I Musculoskeletal:  Equal st and bulk  Skin:  Dry and intact. LLE  non healing ulcer dressing   LABS:  BMET  Recent Labs Lab 12/04/15 0447 12/05/15 0430 12/06/15 0610  NA 141 142 140  K 4.4 4.4 4.1  CL 115* 116* 108  CO2 22 22 24   BUN 42* 41* 49*  CREATININE 1.88* 1.92* 1.92*  GLUCOSE 161* 165* 134*    Electrolytes  Recent Labs Lab 12/03/15 0545 12/04/15 0447 12/05/15 0430 12/06/15 0610  CALCIUM 7.8* 8.4* 8.6* 8.5*  MG 1.6* 2.0 1.8 1.8  PHOS 1.9* 3.4 3.4  --     CBC  Recent Labs Lab 12/02/15 0508 12/04/15 0447 12/06/15 0610  WBC 5.7 10.7* 15.2*  HGB 12.4* 11.0* 11.1*  HCT 37.8* 32.8* 32.4*  PLT 123* 133* 110*    Coag's No results for input(s): APTT, INR in the last 168 hours.  Sepsis Markers  Recent Labs Lab 11/30/15 0645 11/30/15 1020 11/30/15 1300  LATICACIDVEN 2.3* 1.8 1.8    ABG  Recent Labs Lab 12/01/15 1347 12/01/15 1527 12/02/15 0115  PHART 7.193* 7.330* 7.311*  PCO2ART 55.6* 39.1 40.4  PO2ART 220.0* 209* 81.7    Liver Enzymes  Recent Labs Lab 11/30/15 0415 12/04/15 0447  AST 21 30  ALT 18 15*  ALKPHOS 57 66  BILITOT 0.9 0.5  ALBUMIN 3.2* 1.9*    Cardiac Enzymes No results for input(s): TROPONINI, PROBNP in the last 168 hours.  Glucose  Recent Labs Lab 12/04/15 2353 12/05/15 0405 12/05/15 0756 12/05/15 1309 12/05/15 1634 12/05/15 2018  GLUCAP 199* 140* 133* 95 130* 145*    Imaging No results found.   STUDIES:  1/5 EEG: no seizures. C/w metabolic changes/meds  CULTURES: BCX2 1/5>>>ng  ANTIBIOTICS: Cefepime 1/5>>> Flagyl 1/5>>> vanc 1/4>>> 1/9 Imipenem 1/4>>>1/5 (stopped d/t concern for seizure)    LINES/TUBES: RUE PICC 1/3>>> OETT 1/6>>>1/6   DISCUSSION: 65 year old male admitted 12/27 for SBO. Initially managed medically but when sxs not improved surgery was recommended on 1/1; the pt refused @ that time. He presents to the ICU on full vent support on 1/6 after abd exploration where he was found to have perforated viscous and peritonitis. He underwent  washout & repair.  Course complicated by ileus & RUE DVT  ASSESSMENT / PLAN:  PULMONARY A: POst op resp failure s/p exploratory laparotomy , continues to require high O2 - ? Edema vs atelectasis - improved with diuresis H/o nicotine abuse  P:  Ct  [place] Diurese as needed    RENAL A:   AKI -UO improving P:   Renal dose meds  Lasix 80 x 1  GASTROINTESTINAL A:   SBO w/ perforated viscous and resultant peritonitis  Ileus Severe Protein calorie malnutrition - underweight, alb 1.9 P:   Bowel rest Cont TPN Wound care per surgery  PRN Fentanyl   HEMATOLOGIC A:   Neutropenia - resolved Dilutional anemia w/expected post-op blood loss  Thrombocytopenia RUE DVT  P:  Trend CBC Monitor plts on heparin Start IV heparin, dc PICC, will need CVL    INFECTIOUS A:   Peritonitis in setting of perforation d/t SBO Severe sepsis  P:   Dc vanc, ct cefepime x 10-14 days   NEUROLOGIC A:   Acute Encephalopathy in setting of sepsis  H/o seizure d/o-Off AEDs X 2 mo before admit-->EEG 1/5 negative. Severe deconditioning  P:   Try to mobilise with PT Watch closely for evidence of seizures.  If seizure activity would load Keppra   FAMILY  - Updates: none available  - Inter-disciplinary family meet or Palliative Care meeting due by:  1/13  Summary -Expect prolonged course with peritonitis, on bowel rest , delerium & oliguria , hypoxia - improved, RUE DVT & malnutriton are present concerns     Rigoberto Noel. MD   12/06/2015, 9:22 AM

## 2015-12-06 NOTE — Progress Notes (Addendum)
PARENTERAL NUTRITION CONSULT NOTE - FOLLOW-UP  Pharmacy Consult for TPN Indication: Bowel obstruction  Allergies  Allergen Reactions  . Zetia [Ezetimibe] Anaphylaxis and Swelling    Tongue and throat   . Atorvastatin Other (See Comments)    Malaise & muscle weakness  . Penicillins Other (See Comments)    Unknown.  Tolerates cefepime  . Pravastatin Sodium     Pravastatin 40 mg qday and 40 mg q M/W/F caused muscle aches  . Lisinopril Rash  . Promethazine Rash    Patient Measurements: Height: 6\' 2"  (188 cm) Weight: 158 lb 11.7 oz (72 kg) IBW/kg (Calculated) : 82.2 Usual Weight: 137-140 lbs  Vital Signs: Temp: 98.9 F (37.2 C) (01/11 0400) Temp Source: Axillary (01/11 0400) BP: 159/92 mmHg (01/11 0600) Pulse Rate: 97 (01/11 0600) Intake/Output from previous day: 01/10 0701 - 01/11 0700 In: 2270 [I.V.:220; IV Piggyback:400; TPN:1650] Out: 7000 [Urine:5700; Emesis/NG output:1300] Intake/Output from this shift:    Labs:  Recent Labs  12/04/15 0447 12/06/15 0610  WBC 10.7* 15.2*  HGB 11.0* 11.1*  HCT 32.8* 32.4*  PLT 133* 110*     Recent Labs  12/04/15 0447 12/05/15 0430 12/06/15 0610  NA 141 142 140  K 4.4 4.4 4.1  CL 115* 116* 108  CO2 22 22 24   GLUCOSE 161* 165* 134*  BUN 42* 41* 49*  CREATININE 1.88* 1.92* 1.92*  CALCIUM 8.4* 8.6* 8.5*  MG 2.0 1.8 1.8  PHOS 3.4 3.4  --   PROT 4.6*  --   --   ALBUMIN 1.9*  --   --   AST 30  --   --   ALT 15*  --   --   ALKPHOS 66  --   --   BILITOT 0.5  --   --   PREALBUMIN <2*  --   --   TRIG 164*  --   --    Estimated Creatinine Clearance: 40.1 mL/min (by C-G formula based on Cr of 1.92).    Recent Labs  12/05/15 1309 12/05/15 1634 12/05/15 2018  GLUCAP 95 130* 145*    Medical History: Past Medical History  Diagnosis Date  . Hyperlipidemia   . Hypertension   . Leg pain   . Peripheral vascular disease ([place])     s/p prior Ao-Bifem BPG  . 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 Coronary artery disease     a. NSTEMI 7/14=> LHC 06/07/13: Proximal LAD 30%, ostial D1 30%, AV circumflex 80%, then occluded, ostial OM2 occluded, left to left collaterals, mid RCA 95%, inferior HK, EF 60%. PCI: Promus DES to the mid RCA.  No intervention was recommended for the CTO circumflex.  . Ischemic cardiomyopathy     a. Echocardiogram 06/06/13: EF 35-40%, inferior AK, mild MR.  Marland Kitchen Shortness of breath     "can come on at anytime lately; before that it was just w/exertion" (11/10/2013)  . Type II diabetes mellitus ([place])   . [place] mal seizure [facility])     "last one was ~ 3 yr ago; controlled w/daily RX" (11/10/2013)  . Arthritis     "neck, back, legs" (11/10/2013)  . Kidney stones     "when I was younger; passed w/o treatment" (11/10/2013)  . Myocardial infarction ([place])   . Diabetic neuropathy ([place])   . H. pylori infection   . Wears glasses   . NSTEMI (non-ST elevated myocardial infarction) ([place]) 06/05/2013  .  Shoulder contusion 11/07/2013    Medications:  Infusions:  . Marland KitchenTPN (CLINIMIX-E) Adult 65 mL/hr at 12/05/15 1716   And  . fat emulsion 240 mL (12/05/15 1716)    Insulin Requirements in the past 24 hours: 10 units SSI/24h + 15 units Regular insulin in TPN  Current Nutrition: NPO. Clear liquids started 1/11  IVF: None  Central access: PICC placed 1/3 TPN start date: 1/3  ASSESSMENT                                                                                                          HPI: 82 yoM presented to ED on 12/27 with abdominal pain.  PMH includes HTN, HLD, PVD - s/p fem bypass, CAD s/p NSTEMI 2014, cardiomyopathy, DM, diabetic peripheral neuropathy, seizures, and chronic pain on chronic opioids.  Surgical history significant for open AAA repair.  CT shows partial SBO, likely adhesions.  Patient refused surgery, until 1/6 s/p repair of perforated visous.  Plan for conservative management with TPN, bowel rest.   Significant events:   1/1: TPN per pharmacy ordered - unable to start without central access.  1/3: PICC line placed; TPN started 1/5: Acute encephalopathy with fevers (102F) overnight- sepsis protocol initiated.  Broad-spectrum abx started.  1/6: Consented to laparotomy w/ findings of perforated viscus and peritonitis. Txfer to ICU   1/10: NGT removed 1/11: started clear liquid diet  Today 12/06/2015:   Glucose: CBGs at goal of < 150mg /dl.  Insulin added to TPN. Hx DM on glipizide PTA. CBGs better with insulin adjustment in TPN 1/10  Electrolytes - Mg/Phos WNL s/p replacement 1/8; Corr Ca at ULN;   Renal - new AKI; SCr continues to rise but plateauing.  UOP improved  I/O: -4.6L, NGT = 1360ml out?, UOP 5.7 L follow x1 dose lasix  LFTs - AST/ALT and Tbili wnl  TGs - 124 (1/2), 164 (1/9)  Prealbumin - 3.7 (1/2), < 2 (1/9)  NUTRITIONAL GOALS                                                                                             RD recs: 1600-1800 Kcal/day, 65-80 g protein/day, > 2 L fluid/day  Clinimix E 5/15 at a goal rate of 65 ml/hr + 20% fat emulsion at 10 ml/hr to provide: 78 g/day protein, 1588 Kcal/day.  PLAN  Continue Clinimix E 5/15 at goal rate of 65 ml/h  Continue 20% fat emulsion at 10 ml/hr  Standard MVI, MTE in TPN bag.  Continue q6h CBGs with moderate correction insulin.  Continue 15 units regular insulin in TPN  TPN lab panels on Mondays & Thursdays.    Doreene Eland, PharmD, BCPS.   Pager: [phone] 12/06/2015 8:08 AM

## 2015-12-06 NOTE — Progress Notes (Signed)
ANTICOAGULATION CONSULT NOTE - Initial Consult  Pharmacy Consult for heparin Indication: DVT  Allergies  Allergen Reactions  . Zetia [Ezetimibe] Anaphylaxis and Swelling    Tongue and throat   . Atorvastatin Other (See Comments)    Malaise & muscle weakness  . Penicillins Other (See Comments)    Unknown.  Tolerates cefepime  . Pravastatin Sodium     Pravastatin 40 mg qday and 40 mg q M/W/F caused muscle aches  . Lisinopril Rash  . Promethazine Rash    Patient Measurements: Height: 6\' 2"  (188 cm) Weight: 158 lb 11.7 oz (72 kg) IBW/kg (Calculated) : 82.2 Heparin Dosing Weight: 72kg  Vital Signs: Temp: 98.9 F (37.2 C) (01/11 0400) Temp Source: Axillary (01/11 0400) BP: 153/88 mmHg (01/11 0800) Pulse Rate: 86 (01/11 0839)  Labs:  Recent Labs  12/04/15 0447 12/05/15 0430 12/06/15 0610  HGB 11.0*  --  11.1*  HCT 32.8*  --  32.4*  PLT 133*  --  110*  CREATININE 1.88* 1.92* 1.92*    Estimated Creatinine Clearance: 40.1 mL/min (by C-G formula based on Cr of 1.92).   Medical History: Past Medical History  Diagnosis Date  . Hyperlipidemia   . Hypertension   . Leg pain   . Peripheral vascular disease ([place])     s/p prior Ao-Bifem BPG  . 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 Coronary artery disease     a. NSTEMI 7/14=> LHC 06/07/13: Proximal LAD 30%, ostial D1 30%, AV circumflex 80%, then occluded, ostial OM2 occluded, left to left collaterals, mid RCA 95%, inferior HK, EF 60%. PCI: Promus DES to the mid RCA.  No intervention was recommended for the CTO circumflex.  . Ischemic cardiomyopathy     a. Echocardiogram 06/06/13: EF 35-40%, inferior AK, mild MR.  Marland Kitchen Shortness of breath     "can come on at anytime lately; before that it was just w/exertion" (11/10/2013)  . Type II diabetes mellitus ([place])   . [place] mal seizure [facility])     "last one was ~ 3 yr ago; controlled w/daily RX" (11/10/2013)  . Arthritis     "neck,  back, legs" (11/10/2013)  . Kidney stones     "when I was younger; passed w/o treatment" (11/10/2013)  . Myocardial infarction ([place])   . Diabetic neuropathy ([place])   . H. pylori infection   . Wears glasses   . NSTEMI (non-ST elevated myocardial infarction) ([place]) 06/05/2013  . Shoulder contusion 11/07/2013    Assessment: 75 YOM known to pharmacy for TPN management and antibiotics dosing.  He is s/p ex lap on 1/6 and found to have perforated viscous and peritonitis.  RUE doppler 1/11 reveals DVT at and/or near PICC site.    Today, 12/06/2015  On enoxaparin 40mg  q24h - last dose 1/10 17:00  CBC: anemia - relatively stable, thrombocytopenia worse this am but > 100  AKI - SCr stable with CrCl > 89ml/min  Goal of Therapy:  Heparin level 0.3-0.7 units/ml Monitor platelets by anticoagulation protocol: Yes   Plan:   Order baseline labs - aPTT/INR  D/C enoxaparin VTE prophylaxis  Heparin (no bolus d/t recent surgery) at 1150 units/hr  Check 6-8h heparin level  Daily CBC and heparin level  Doreene Eland, PharmD, BCPS.   Pager: [phone] 12/06/2015 9:27 AM

## 2015-12-06 NOTE — Progress Notes (Signed)
[name] PROGRESS NOTE  NOURI BRUSKY [id number] DOB: [birth year] DOA: 11/21/2015 PCP: Marijean Bravo, MD  Assessment/Plan:  65 year old man with a PMH of hyperlipidemia, hypertension, CAD, type 2 diabetes, diabetic peripheral neuropathy, seizure disorder, polysubstance abuse and chronic pain issues (on high doses of oxycodone prior to admission) who was admitted 11/22/15 with a small bowel obstruction. Surgical consultation was obtained on admission with conservative therapy initially recommended. His bowel obstruction was felt to be from adhesions and possible narcotic bowel. His NG tube was removed 11/23/15 and he seemed to be improving clinically, but pain returned 11/24/15 with diet advancement to clears. Repeat films showed worsening small bowel dilatation so his NG tube was reinserted. TPN was subsequently ordered. On 11/27/15, the patient was noted to be refusing PICC line insertion for TPN and surgical intervention. PICC finally placed 11/28/15 with initiation of TPN. On 11/30/15, the patient's condition deteriorated acutely and he was found to be confused and restless. Rapid response was called. He was febrile and tachycardic with an elevated lactate level. Due to concerns for sepsis, broad-spectrum antibiotics were initiated with vancomycin and Primaxin which was switched to cefepime given his seizure history. Underwent exploratory laparotomy on 12/01/15 with findings of perforated viscus with peritoneal contamination/peritonitis. He remained intubated postoperatively and was under the care of the critical care team. He self extubated 12/02/15. Surgery continues to follow, and has an open abdominal wound. He continues to have an ileus and is on TNA at this time. [name] will assume care 12/05/15.  SBO w/ perforated viscous and resultant peritonitis  Severe Protein calorie malnutrition - underweight, alb 1.9 Bowel rest Hold TPN , PICC line will be remove due to acute DVT. He was  started on clear diet today. If not significant intake will need to have another PICC line for TPN.  Wound care per surgery  PRN Fentanyl   Acute upper extremity DVT:  [place]; DVT right distal axillary vein, subclavian vein.  Discussed with surgery ok to start heparin Gtt. I will check hb in am.  Will remove PICC line.   Severe sepsis  BP stable.  Continue with IV antibiotics.   Post op resp failure s/p exploratory laparotomy.  vent support on 1/6 , extubation: 1-6 Worsening hypoxemia in setting of pulmonary edema. Chest x ray with pulmonary interstitial infiltrates.  Respiratory failure improved with 80 mg IV lasix given 1-10.  Will hold on further lasix today. He had 5 L urine out put yesterday. Might need further doses tomorrow.   Peritonitis in setting of perforation d/t SBO  Severe sepsis  Dc vanc, ct cefepime WBC increases this am. Repeat labs tomorrow.   Acute Encephalopathy in setting of sepsis  H/o seizure d/o-Off AEDs X 2 mo before admit-->EEG 1/5 negative.  Watch closely for evidence of seizures.  If seizure activity would load Keppra.  AKI -UO improving Urine out put 5  L last 24 hours.  Follow cr trend. Stable at 1.9 today. Repeat labs in am.  Holding fluids due to pulmonary edema.   H/o cad  S/p left fem pop bypass w/ non-healing ulcer  Neutropenia - resolved Dilutional anemia w/expected post-op blood loss   Type II DM SSI.    Code Status: Full Code.  Family Communication: none at bedside, care discussed with patient.  Disposition Plan: Remain in the step down.    Consultants:  Surgery   CCM transfer care to [facility] 1-10   Procedures: exploratory laparotomy on 12/01/15   Antibiotics:  Flagyl  11-30-2015  Cefepime 12-02-2015  HPI/Subjective: Alert, ill appearing.  Complaining of dyspnea and chest pain.   Objective: Filed Vitals:   12/06/15 0000 12/06/15 0400  BP:    Pulse:    Temp: 98.7 F (37.1 C) 98.9 F (37.2 C)  Resp:       Intake/Output Summary (Last 24 hours) at 12/06/15 0743 Last data filed at 12/05/15 2200  Gross per 24 hour  Intake   1520 ml  Output   5600 ml  Net  -4080 ml   Filed Weights   12/02/15 0425 12/02/15 0651 12/06/15 0400  Weight: 72.3 kg (159 lb 6.3 oz) 69.5 kg (153 lb 3.5 oz) 72 kg (158 lb 11.7 oz)    Exam:   General:  Chronic ill appearing. No acute distress  Cardiovascular: S 1, S 2 RRR  Respiratory: good air sound on the right, no significant crackles.   Abdomen: decrease Bowel sound, wound mid abdomen with clean dressing.   Musculoskeletal: scd , no LE edema. Right arm with swelling   Data Reviewed: Basic Metabolic Panel:  Recent Labs Lab 12/01/15 0315  12/02/15 0508 12/02/15 [id number] 12/03/15 0545 12/04/15 0447 12/05/15 0430 12/06/15 0610  NA 141  < > 132* 142 141 141 142 140  K 3.8  < > 5.5* 4.0 3.8 4.4 4.4 4.1  CL 108  --  105 111 112* 115* 116* 108  CO2 23  --  22 24 21* 22 22 24   GLUCOSE 167*  --  719* 169* 154* 161* 165* 134*  BUN 16  --  29* 31* 39* 42* 41* 49*  CREATININE 1.07  --  1.62* 1.66* 1.89* 1.88* 1.92* 1.92*  CALCIUM 8.8*  --  7.7* 7.8* 7.8* 8.4* 8.6* 8.5*  MG 2.0  --  2.2  --  1.6* 2.0 1.8 1.8  PHOS 2.3*  --  6.3*  --  1.9* 3.4 3.4  --   < > = values in this interval not displayed. Liver Function Tests:  Recent Labs Lab 11/30/15 0415 12/04/15 0447  AST 21 30  ALT 18 15*  ALKPHOS 57 66  BILITOT 0.9 0.5  PROT 6.0* 4.6*  ALBUMIN 3.2* 1.9*   No results for input(s): LIPASE, AMYLASE in the last 168 hours.  Recent Labs Lab 11/30/15 1020 12/01/15 0315  AMMONIA 69* 38*   CBC:  Recent Labs Lab 11/30/15 0415 12/01/15 0315 12/01/15 1347 12/02/15 0508 12/04/15 0447 12/06/15 0610  WBC 1.4* 3.3*  --  5.7 10.7* 15.2*  NEUTROABS  --  2.6  --   --  8.0*  --   HGB 12.9* 16.1 14.3 12.4* 11.0* 11.1*  HCT 39.8 48.0 42.0 37.8* 32.8* 32.4*  MCV 90.2 87.9  --  91.7 87.5 84.6  PLT PLATELET CLUMPS NOTED ON SMEAR, UNABLE TO ESTIMATE  PLATELET CLUMPS NOTED ON SMEAR, UNABLE TO ESTIMATE  --  123* 133* 110*   Cardiac Enzymes:  Recent Labs Lab 11/30/15 1020  CKTOTAL 103   BNP (last 3 results) No results for input(s): BNP in the last 8760 hours.  ProBNP (last 3 results) No results for input(s): PROBNP in the last 8760 hours.  CBG:  Recent Labs Lab 12/05/15 0405 12/05/15 0756 12/05/15 1309 12/05/15 1634 12/05/15 2018  GLUCAP 140* 133* 95 130* 145*    Recent Results (from the past 240 hour(s))  Culture, Urine     Status: None   Collection Time: 11/30/15  6:21 AM  Result Value Ref Range Status   Specimen Description URINE,  CLEAN CATCH  Final   Special Requests NONE  Final   Culture   Final    NO GROWTH 1 DAY Performed at [facility]    Report Status 12/01/2015 FINAL  Final  Culture, blood (Routine X 2) w Reflex to ID Panel     Status: None   Collection Time: 11/30/15  6:31 AM  Result Value Ref Range Status   Specimen Description BLOOD LEFT ARM  Final   Special Requests IN PEDIATRIC BOTTLE [place]  Final   Culture   Final    NO GROWTH 5 DAYS Performed at [facility]    Report Status 12/05/2015 FINAL  Final  Culture, blood (Routine X 2) w Reflex to ID Panel     Status: None   Collection Time: 11/30/15  6:45 AM  Result Value Ref Range Status   Specimen Description BLOOD RIGHT PICC LINE  Final   Special Requests BOTTLES DRAWN AEROBIC AND ANAEROBIC 10CC  Final   Culture   Final    NO GROWTH 5 DAYS Performed at [facility]    Report Status 12/05/2015 FINAL  Final  MRSA PCR Screening     Status: None   Collection Time: 12/04/15  9:57 AM  Result Value Ref Range Status   MRSA by PCR NEGATIVE NEGATIVE Final    Comment:        The GeneXpert MRSA Assay (FDA approved for NASAL specimens only), is one component of a comprehensive MRSA colonization surveillance program. It is not intended to diagnose MRSA infection nor to guide or monitor treatment for MRSA infections.       Studies: Dg Chest Port 1 View  12/05/2015  CLINICAL DATA:  Small bowel obstruction, acute onset of hypoxia ; history of diabetes, coronary artery disease with stent placement, peripheral vascular disease, current smoker. EXAM: PORTABLE CHEST 1 VIEW COMPARISON:  Chest x-ray of December 03, 2015 FINDINGS: Acute worsening of pulmonary interstitial density has occurred bilaterally. The right hemidiaphragm is now obscured. There is partial obscuration of the left hemidiaphragm. There are small bilateral pleural effusions. The heart is not enlarged. The pulmonary vascularity is not clearly engorged. The right-sided PICC line tip projects over the distal third of the SVC. The esophagogastric tube tip projects below the inferior margin of the image. IMPRESSION: Worsening of pulmonary interstitial infiltrates bilaterally. This may reflect pulmonary edema of cardiac or noncardiac cause or could reflect pneumonia. There are small bilateral pleural effusions. Electronically Signed   By: David  Martinique M.D.   On: 12/05/2015 08:52    Scheduled Meds: . antiseptic oral rinse  7 mL Mouth Rinse q12n4p  . bacitracin   Topical Daily  . ceFEPime (MAXIPIME) IV  1 g Intravenous Q12H  . chlorhexidine  15 mL Mouth Rinse BID  . collagenase  1 application Topical Daily  . enoxaparin (LOVENOX) injection  40 mg Subcutaneous Q24H  . insulin aspart  0-15 Units Subcutaneous 6 times per day  . lip balm  1 application Topical BID  . metronidazole  500 mg Intravenous Q8H  . pantoprazole (PROTONIX) IV  40 mg Intravenous Q12H  . sodium chloride  3 mL Intravenous Q12H  . thiamine  100 mg Oral Daily   Or  . thiamine  100 mg Intravenous Daily   Continuous Infusions: . Marland KitchenTPN (CLINIMIX-E) Adult 65 mL/hr at 12/05/15 1716   And  . 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     Niel Hummer A  [name] Pager [phone]. If 7PM-7AM, please contact night-coverage at [email or web address], password [facility] 12/06/2015, 7:43 AM  LOS: 15 days

## 2015-12-06 NOTE — Progress Notes (Signed)
VASCULAR LAB PRELIMINARY  PRELIMINARY  PRELIMINARY  PRELIMINARY  Right upper extremity venous duplex completed.    Preliminary report:  Positive for an acute DVT  coursing along the PICC line from the right distal axillary vein through the subclavian vein with worsening at the proximal subclavian. There does not appear to be propagation to the left side at this time, Also noted is a superficial thrombus noted in the basilic vein just above the [facility]. Unable to follow proximally due to  PICC line bandaging. There also appears to be a chronic superficial thrombus in the forearm cephalic. Unable to visualize further due to interstitial fluid. The remainder of the right upper extremity appears thrombus free at this time  Toma Copier, RVS 12/06/2015, 8:44 AM

## 2015-12-07 ENCOUNTER — Encounter (HOSPITAL_COMMUNITY): Payer: Self-pay | Admitting: Radiology

## 2015-12-07 ENCOUNTER — Inpatient Hospital Stay (HOSPITAL_COMMUNITY): Payer: Medicaid Other

## 2015-12-07 DIAGNOSIS — K631 Perforation of intestine (nontraumatic): Secondary | ICD-10-CM

## 2015-12-07 LAB — COMPREHENSIVE METABOLIC PANEL
ALT: 14 U/L — ABNORMAL LOW (ref 17–63)
AST: 27 U/L (ref 15–41)
Albumin: 1.8 g/dL — ABNORMAL LOW (ref 3.5–5.0)
Alkaline Phosphatase: 72 U/L (ref 38–126)
Anion gap: 7 (ref 5–15)
BUN: 51 mg/dL — ABNORMAL HIGH (ref 6–20)
CO2: 24 mmol/L (ref 22–32)
Calcium: 8.4 mg/dL — ABNORMAL LOW (ref 8.9–10.3)
Chloride: 110 mmol/L (ref 101–111)
Creatinine, Ser: 1.89 mg/dL — ABNORMAL HIGH (ref 0.61–1.24)
GFR calc non Af Amer: 36 mL/min — ABNORMAL LOW (ref >60)
GFR, EST AFRICAN AMERICAN: 42 mL/min — AB (ref >60)
Glucose, Bld: 140 mg/dL — ABNORMAL HIGH (ref 65–99)
POTASSIUM: 4 mmol/L (ref 3.5–5.1)
Sodium: 141 mmol/L (ref 135–145)
Total Bilirubin: 0.9 mg/dL (ref 0.3–1.2)
Total Protein: 5.3 g/dL — ABNORMAL LOW (ref 6.5–8.1)

## 2015-12-07 LAB — CBC
HCT: 29.5 % — ABNORMAL LOW (ref 39.0–52.0)
HEMOGLOBIN: 10 g/dL — AB (ref 13.0–17.0)
MCH: 28.8 pg (ref 26.0–34.0)
MCHC: 33.9 g/dL (ref 30.0–36.0)
MCV: 85 fL (ref 78.0–100.0)
Platelets: 120 10*3/uL — ABNORMAL LOW (ref 150–400)
RBC: 3.47 MIL/uL — AB (ref 4.22–5.81)
RDW: 15.1 % (ref 11.5–15.5)
WBC: 15.9 10*3/uL — ABNORMAL HIGH (ref 4.0–10.5)

## 2015-12-07 LAB — PHOSPHORUS: PHOSPHORUS: 4.1 mg/dL (ref 2.5–4.6)

## 2015-12-07 LAB — HEPARIN LEVEL (UNFRACTIONATED): HEPARIN UNFRACTIONATED: 0.21 [IU]/mL — AB (ref 0.30–0.70)

## 2015-12-07 LAB — GLUCOSE, CAPILLARY
GLUCOSE-CAPILLARY: 120 mg/dL — AB (ref 65–99)
GLUCOSE-CAPILLARY: 146 mg/dL — AB (ref 65–99)
GLUCOSE-CAPILLARY: 154 mg/dL — AB (ref 65–99)
GLUCOSE-CAPILLARY: 150 mg/dL — AB (ref 65–99)
GLUCOSE-CAPILLARY: 132 mg/dL — AB (ref 65–99)
Glucose-Capillary: 117 mg/dL — ABNORMAL HIGH (ref 65–99)

## 2015-12-07 LAB — MAGNESIUM: MAGNESIUM: 1.6 mg/dL — AB (ref 1.7–2.4)

## 2015-12-07 MED ORDER — TRACE MINERALS CR-CU-MN-SE-ZN 10-1000-500-60 MCG/ML IV SOLN
INTRAVENOUS | Status: AC
  Administered 2015-12-07: 18:00:00 via INTRAVENOUS
  Filled 2015-12-07: qty 1560

## 2015-12-07 MED ORDER — MAGNESIUM SULFATE 2 GM/50ML IV SOLN
2.0000 g | Freq: Once | INTRAVENOUS | Status: AC
  Administered 2015-12-07: 2 g via INTRAVENOUS
  Filled 2015-12-07: qty 50

## 2015-12-07 MED ORDER — FUROSEMIDE 10 MG/ML IJ SOLN
40.0000 mg | Freq: Once | INTRAMUSCULAR | Status: AC
  Administered 2015-12-07: 40 mg via INTRAVENOUS
  Filled 2015-12-07: qty 4

## 2015-12-07 MED ORDER — IOHEXOL 300 MG/ML  SOLN
25.0000 mL | INTRAMUSCULAR | Status: AC
  Administered 2015-12-07 (×2): 25 mL via ORAL

## 2015-12-07 MED ORDER — HEPARIN (PORCINE) IN NACL 100-0.45 UNIT/ML-% IJ SOLN
1600.0000 [IU]/h | INTRAMUSCULAR | Status: DC
  Administered 2015-12-07: 1600 [IU]/h via INTRAVENOUS
  Filled 2015-12-07 (×3): qty 250

## 2015-12-07 MED ORDER — HEPARIN (PORCINE) IN NACL 100-0.45 UNIT/ML-% IJ SOLN
1450.0000 [IU]/h | INTRAMUSCULAR | Status: DC
  Administered 2015-12-07: 1450 [IU]/h via INTRAVENOUS
  Filled 2015-12-07: qty 250

## 2015-12-07 MED ORDER — HEPARIN (PORCINE) IN NACL 100-0.45 UNIT/ML-% IJ SOLN
2100.0000 [IU]/h | INTRAMUSCULAR | Status: DC
  Administered 2015-12-08 – 2015-12-10 (×4): 1800 [IU]/h via INTRAVENOUS
  Filled 2015-12-07 (×7): qty 250

## 2015-12-07 MED ORDER — FAT EMULSION 20 % IV EMUL
240.0000 mL | INTRAVENOUS | Status: AC
  Administered 2015-12-07: 240 mL via INTRAVENOUS
  Filled 2015-12-07: qty 250

## 2015-12-07 MED ORDER — OXYCODONE HCL 5 MG PO TABS
5.0000 mg | ORAL_TABLET | ORAL | Status: DC | PRN
  Administered 2015-12-07 (×2): 10 mg via ORAL
  Administered 2015-12-08: 5 mg via ORAL
  Filled 2015-12-07: qty 1
  Filled 2015-12-07 (×2): qty 2

## 2015-12-07 NOTE — Progress Notes (Addendum)
Nutrition Follow-up  INTERVENTION:   Provide Boost Breeze po TID, each supplement provides 250 kcal and 9 grams of protein RD to continue to monitor   NUTRITION DIAGNOSIS:   Inadequate oral intake related to inability to eat as evidenced by other (see comment) (liquid diet).  Ongoing.  GOAL:   Patient will meet greater than or equal to 90% of their needs  Not meeting.  MONITOR:   PO intake, Supplement acceptance, Labs, Weight trends, Skin, I & O's, Other (Comment) (TPN)  ASSESSMENT:   65 y.o. male with a past medical history of hyperlipidemia, hypertension, CAD, type 2 diabetes, diabetic peripheral neuropathy, seizure disorder who comes to the ER due to abdominal pain since this morning, shortly after he woke up, associated with abdominal distention, nausea and 2 episodes of emesis, one at home and one in the emergency department this afternoon per patient. He denies fever, chills, diarrhea, constipation, melena or hematochezia.   RD attempted to see patient to complete NFPE. Pt states that he was having a BM and tech outside of room let RD know pt is on the bedpan. Will attempt at follow-up. Pt continues on TPN an diet has been advanced to full liquids. Will monitor PO intake. RD to order supplement, Boost Breeze.  Plan per Pharmacy:  Continue Clinimix E 5/15 at goal rate of 65 ml/h  Continue 20% fat emulsion at 10 ml/hr  Labs reviewed: CBGs: 120-154 Elevated BUN, Creatinine Phos WNL Low Mg  Diet Order:  TPN (CLINIMIX-E) Adult Diet full liquid Room service appropriate?: Yes; Fluid consistency:: Thin TPN (CLINIMIX-E) Adult  Skin:  Wound (see comment) (L foot and toe DM ulcers)  Last BM:  1/11  Height:   Ht Readings from Last 1 Encounters:  12/01/15 6\' 2"  (1.88 m)    Weight:   Wt Readings from Last 1 Encounters:  12/06/15 158 lb 11.7 oz (72 kg)    Ideal Body Weight:  86.36 kg (kg)  BMI:  Body mass index is 20.37 kg/(m^2).  Estimated Nutritional Needs:    Kcal:  1600-1800  Protein:  65-80 grams  Fluid:  >/= 2 L/day  EDUCATION NEEDS:   No education needs identified at this time  Clayton Bibles, MS, RD, LDN Pager: [phone] After Hours Pager: [phone]

## 2015-12-07 NOTE — Progress Notes (Signed)
Date: December 07, 2015 Chart reviewed for concurrent status and case management needs. Will continue to follow patient for changes and needs: continues to have post op ileus and high o2 requirement with frequency adjustment to the o2 to maintain sats. Velva Harman, RN, BSN, [place]   [phone]

## 2015-12-07 NOTE — Progress Notes (Signed)
Patient ID: [name] [name], male   DOB: 02/24/1951, 65 y.o.   MRN: [id number]     [address] [place], [place] [address]    Phone: [phone] FAX: [phone]     Subjective: Alert, talkative pleasant.  Having bowel movements and tolerating liquids.  Afebrile.  WBC rising, up to 15.9k.   C/o pain. No n/v.    Objective:  Vital signs:  Filed Vitals:   12/07/15 0300 12/07/15 0400 12/07/15 0500 12/07/15 0600  BP: 166/89 157/96 157/96 129/63  Pulse: 87 81 84 84  Temp:  99.7 F (37.6 C)    TempSrc:  Oral    Resp: 17 19 17 18   Height:      Weight:      SpO2: 95% 95% 96% 97%    Last BM Date: 12/06/15  Intake/Output   Yesterday:  01/11 0701 - 01/12 0700 In: 2309.1 [P.O.:240; I.V.:143; IV Piggyback:350; TPN:1576.1] Out: 2100 [Urine:2100] This shift:     Physical Exam: General: Pt awake/alert to self.   Abdomen: +BS, abdomen is soft, nondistended. Midline wound is clean, fascia is intact, 2 retention sutures in place, packing replaced.     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[place])   Ischemic cardiomyopathy   Type II diabetes mellitus ([place])   Diabetic neuropathy ([place])   Noncompliance   Underweight   Deep vein thrombosis (DVT) of right upper extremity ([place])   Acute respiratory failure with hypoxemia ([place])   DVT of axillary vein, acute right    Results:   Labs: Results for orders placed or performed during the hospital encounter of 11/21/15 (from the past 48 hour(s))   Glucose, capillary     Status: None   Collection Time: 12/05/15  1:09 PM  Result Value Ref Range   Glucose-Capillary 95 65 - 99 mg/dL   Comment 1 Notify RN    Comment 2 Document in Chart   Glucose, capillary     Status: Abnormal   Collection Time: 12/05/15  4:34 PM  Result Value Ref Range   Glucose-Capillary 130 (H) 65 - 99 mg/dL   Comment 1 Notify RN    Comment 2 Document in Chart   Glucose, capillary     Status: Abnormal   Collection Time: 12/05/15  8:18 PM  Result Value Ref Range   Glucose-Capillary 145 (H) 65 - 99 mg/dL   Comment 1 Notify RN    Comment 2 Document in Chart   Glucose, capillary     Status: Abnormal   Collection Time: 12/05/15 11:30 PM  Result Value Ref Range   Glucose-Capillary 130 (H) 65 - 99 mg/dL   Comment 1 Notify RN    Comment 2 Document in Chart   Glucose, capillary     Status: Abnormal   Collection Time: 12/06/15  3:33 AM  Result Value Ref Range   Glucose-Capillary 144 (H) 65 - 99 mg/dL   Comment 1 Notify RN    Comment 2 Document in Chart   CBC  Status: Abnormal   Collection Time: 12/06/15  6:10 AM  Result Value Ref Range   WBC 15.2 (H) 4.0 - 10.5 K/uL   RBC 3.83 (L) 4.22 - 5.81 MIL/uL   Hemoglobin 11.1 (L) 13.0 - 17.0 g/dL   HCT 32.4 (L) 39.0 - 52.0 %   MCV 84.6 78.0 - 100.0 fL   MCH 29.0 26.0 - 34.0 pg   MCHC 34.3 30.0 - 36.0 g/dL   RDW 15.1 11.5 - 15.5 %   Platelets 110 (L) 150 - 400 K/uL    Comment: SPECIMEN CHECKED FOR CLOTS PLATELET COUNT CONFIRMED BY SMEAR LARGE PLATELETS PRESENT   Basic metabolic panel     Status: Abnormal   Collection Time: 12/06/15  6:10 AM  Result Value Ref Range   Sodium 140 135 - 145 mmol/L   Potassium 4.1 3.5 - 5.1 mmol/L   Chloride 108 101 - 111 mmol/L   CO2 24 22 - 32 mmol/L   Glucose, Bld 134 (H) 65 - 99 mg/dL   BUN 49 (H) 6 - 20 mg/dL   Creatinine, Ser 1.92 (H) 0.61 - 1.24 mg/dL   Calcium 8.5 (L) 8.9 - 10.3 mg/dL   GFR calc non Af Amer 35 (L) >60 mL/min   GFR calc Af Amer 41 (L) >60 mL/min     Comment: (NOTE) The eGFR has been calculated using the CKD EPI equation. This calculation has not been validated in all clinical situations. eGFR's persistently <60 mL/min signify possible Chronic Kidney Disease.    Anion gap 8 5 - 15  Magnesium     Status: None   Collection Time: 12/06/15  6:10 AM  Result Value Ref Range   Magnesium 1.8 1.7 - 2.4 mg/dL  Glucose, capillary     Status: Abnormal   Collection Time: 12/06/15  8:00 AM  Result Value Ref Range   Glucose-Capillary 144 (H) 65 - 99 mg/dL   Comment 1 Notify RN    Comment 2 Document in Chart   Protime-INR     Status: Abnormal   Collection Time: 12/06/15  9:25 AM  Result Value Ref Range   Prothrombin Time 21.6 (H) 11.6 - 15.2 seconds   INR 1.89 (H) 0.00 - 1.49  APTT     Status: Abnormal   Collection Time: 12/06/15  9:25 AM  Result Value Ref Range   aPTT 45 (H) 24 - 37 seconds    Comment:        IF BASELINE aPTT IS ELEVATED, SUGGEST PATIENT RISK ASSESSMENT BE USED TO DETERMINE APPROPRIATE ANTICOAGULANT THERAPY.   Glucose, capillary     Status: Abnormal   Collection Time: 12/06/15 12:18 PM  Result Value Ref Range   Glucose-Capillary 136 (H) 65 - 99 mg/dL   Comment 1 Notify RN    Comment 2 Document in Chart   Glucose, capillary     Status: Abnormal   Collection Time: 12/06/15  4:56 PM  Result Value Ref Range   Glucose-Capillary 156 (H) 65 - 99 mg/dL   Comment 1 Notify RN    Comment 2 Document in Chart   CBC     Status: Abnormal   Collection Time: 12/06/15  5:40 PM  Result Value Ref Range   WBC 14.6 (H) 4.0 - 10.5 K/uL   RBC 3.76 (L) 4.22 - 5.81 MIL/uL   Hemoglobin 11.0 (L) 13.0 - 17.0 g/dL   HCT 31.8 (L) 39.0 - 52.0 %   MCV 84.6 78.0 - 100.0 fL   MCH 29.3 26.0 -  34.0 pg   MCHC 34.6 30.0 - 36.0 g/dL   RDW 15.1 11.5 - 15.5 %   Platelets 118 (L) 150 - 400 K/uL    Comment: PLATELET COUNT CONFIRMED BY SMEAR  Glucose, capillary     Status: Abnormal   Collection Time: 12/06/15  7:59 PM  Result Value Ref Range    Glucose-Capillary 135 (H) 65 - 99 mg/dL   Comment 1 Notify RN    Comment 2 Document in Chart   Glucose, capillary     Status: Abnormal   Collection Time: 12/06/15 11:22 PM  Result Value Ref Range   Glucose-Capillary 128 (H) 65 - 99 mg/dL  Heparin level (unfractionated)     Status: Abnormal   Collection Time: 12/06/15 11:28 PM  Result Value Ref Range   Heparin Unfractionated <0.10 (L) 0.30 - 0.70 IU/mL    Comment:        IF HEPARIN RESULTS ARE BELOW EXPECTED VALUES, AND PATIENT DOSAGE HAS BEEN CONFIRMED, SUGGEST FOLLOW UP TESTING OF ANTITHROMBIN III LEVELS.   Glucose, capillary     Status: Abnormal   Collection Time: 12/07/15  3:27 AM  Result Value Ref Range   Glucose-Capillary 120 (H) 65 - 99 mg/dL   Comment 1 Notify RN    Comment 2 Document in Chart   Comprehensive metabolic panel     Status: Abnormal   Collection Time: 12/07/15  4:45 AM  Result Value Ref Range   Sodium 141 135 - 145 mmol/L   Potassium 4.0 3.5 - 5.1 mmol/L   Chloride 110 101 - 111 mmol/L   CO2 24 22 - 32 mmol/L   Glucose, Bld 140 (H) 65 - 99 mg/dL   BUN 51 (H) 6 - 20 mg/dL   Creatinine, Ser 1.89 (H) 0.61 - 1.24 mg/dL   Calcium 8.4 (L) 8.9 - 10.3 mg/dL   Total Protein 5.3 (L) 6.5 - 8.1 g/dL   Albumin 1.8 (L) 3.5 - 5.0 g/dL   AST 27 15 - 41 U/L   ALT 14 (L) 17 - 63 U/L   Alkaline Phosphatase 72 38 - 126 U/L   Total Bilirubin 0.9 0.3 - 1.2 mg/dL   GFR calc non Af Amer 36 (L) >60 mL/min   GFR calc Af Amer 42 (L) >60 mL/min    Comment: (NOTE) The eGFR has been calculated using the CKD EPI equation. This calculation has not been validated in all clinical situations. eGFR's persistently <60 mL/min signify possible Chronic Kidney Disease.    Anion gap 7 5 - 15  Magnesium     Status: Abnormal   Collection Time: 12/07/15  4:45 AM  Result Value Ref Range   Magnesium 1.6 (L) 1.7 - 2.4 mg/dL  Phosphorus     Status: None   Collection Time: 12/07/15  4:45 AM  Result Value Ref Range   Phosphorus 4.1 2.5 -  4.6 mg/dL  CBC     Status: Abnormal   Collection Time: 12/07/15  4:45 AM  Result Value Ref Range   WBC 15.9 (H) 4.0 - 10.5 K/uL   RBC 3.47 (L) 4.22 - 5.81 MIL/uL   Hemoglobin 10.0 (L) 13.0 - 17.0 g/dL   HCT 29.5 (L) 39.0 - 52.0 %   MCV 85.0 78.0 - 100.0 fL   MCH 28.8 26.0 - 34.0 pg   MCHC 33.9 30.0 - 36.0 g/dL   RDW 15.1 11.5 - 15.5 %   Platelets 120 (L) 150 - 400 K/uL  Glucose, capillary  Status: Abnormal   Collection Time: 12/07/15  7:37 AM  Result Value Ref Range   Glucose-Capillary 146 (H) 65 - 99 mg/dL    Imaging / Studies: Dg Chest Port 1 View  12/06/2015  CLINICAL DATA:  Encounter for central line placement. EXAM: PORTABLE CHEST 1 VIEW COMPARISON:  December 05, 2015. FINDINGS: The heart size and mediastinal contours are within normal limits. No pneumothorax is noted. Stable mild interstitial densities are noted throughout both lungs consistent with pulmonary edema. Stable right basilar opacity is noted concerning for atelectasis with associated pleural effusion. Nasogastric tube has been removed. Right-sided PICC line is again noted with tip at cavoatrial junction. Interval placement of left internal jugular catheter with distal tip in SVC. The visualized skeletal structures are unremarkable. IMPRESSION: Stable bilateral pulmonary edema, with right basilar subsegmental atelectasis and associated pleural effusion. Interval placement of left internal jugular catheter with distal tip in SVC. No pneumothorax is noted. Electronically Signed   By: Marijo Conception, M.D.   On: 12/06/2015 16:10   Dg Chest Port 1 View  12/05/2015  CLINICAL DATA:  Small bowel obstruction, acute onset of hypoxia ; history of diabetes, coronary artery disease with stent placement, peripheral vascular disease, current smoker. EXAM: PORTABLE CHEST 1 VIEW COMPARISON:  Chest x-ray of December 03, 2015 FINDINGS: Acute worsening of pulmonary interstitial density has occurred bilaterally. The right hemidiaphragm is now  obscured. There is partial obscuration of the left hemidiaphragm. There are small bilateral pleural effusions. The heart is not enlarged. The pulmonary vascularity is not clearly engorged. The right-sided PICC line tip projects over the distal third of the SVC. The esophagogastric tube tip projects below the inferior margin of the image. IMPRESSION: Worsening of pulmonary interstitial infiltrates bilaterally. This may reflect pulmonary edema of cardiac or noncardiac cause or could reflect pneumonia. There are small bilateral pleural effusions. Electronically Signed   By: David  Martinique M.D.   On: 12/05/2015 08:52    Medications / Allergies:  Scheduled Meds: . antiseptic oral rinse  7 mL Mouth Rinse q12n4p  . bacitracin   Topical Daily  . ceFEPime (MAXIPIME) IV  1 g Intravenous Q12H  . chlorhexidine  15 mL Mouth Rinse BID  . collagenase  1 application Topical Daily  . insulin aspart  0-15 Units Subcutaneous 6 times per day  . lip balm  1 application Topical BID  . pantoprazole (PROTONIX) IV  40 mg Intravenous Q12H  . sodium chloride  3 mL Intravenous Q12H  . thiamine  100 mg Oral Daily   Or  . thiamine  100 mg Intravenous Daily   Continuous Infusions: . Marland KitchenTPN (CLINIMIX-E) Adult 65 mL/hr at 12/06/15 1725   And  . fat emulsion 240 mL (12/06/15 1726)  . heparin 1,450 Units/hr (12/07/15 0156)   PRN Meds:.acetaminophen, albuterol, alum & mag hydroxide-simeth, bisacodyl, diphenhydrAMINE, haloperidol lactate, hydrALAZINE, HYDROmorphone (DILAUDID) injection, magic mouthwash, menthol-cetylpyridinium, phenol, sodium chloride, triamcinolone cream  Antibiotics: Anti-infectives    Start     Dose/Rate Route Frequency Ordered Stop   12/03/15 1800  vancomycin (VANCOCIN) IVPB 750 mg/150 ml premix  Status:  Discontinued     750 mg 150 mL/hr over 60 Minutes Intravenous Every 24 hours 12/03/15 1715 12/04/15 0911   12/02/15 1800  ceFEPIme (MAXIPIME) 1 g in dextrose 5 % 50 mL IVPB     1 g 100 mL/hr over 30  Minutes Intravenous Every 12 hours 12/02/15 1014 12/09/15 1759   12/02/15 0600  vancomycin (VANCOCIN) IVPB 1000 mg/200 mL premix  Status:  Discontinued     1,000 mg 200 mL/hr over 60 Minutes Intravenous 3 times per day 12/02/15 0547 12/02/15 0954   12/01/15 2200  ceFEPIme (MAXIPIME) 1 g in dextrose 5 % 50 mL IVPB  Status:  Discontinued     1 g 100 mL/hr over 30 Minutes Intravenous 3 times per day 12/01/15 1455 12/02/15 1014   11/30/15 1400  ceFEPIme (MAXIPIME) 1 g in dextrose 5 % 50 mL IVPB  Status:  Discontinued     1 g 100 mL/hr over 30 Minutes Intravenous 3 times per day 11/30/15 0913 12/01/15 1455   11/30/15 1000  metroNIDAZOLE (FLAGYL) IVPB 500 mg     500 mg 100 mL/hr over 60 Minutes Intravenous Every 8 hours 11/30/15 0858 12/07/15 0226   11/30/15 0645  vancomycin (VANCOCIN) IVPB 750 mg/150 ml premix  Status:  Discontinued     750 mg 150 mL/hr over 60 Minutes Intravenous 3 times per day 11/30/15 0630 12/02/15 0547   11/30/15 0645  imipenem-cilastatin (PRIMAXIN) 500 mg in sodium chloride 0.9 % 100 mL IVPB  Status:  Discontinued     500 mg 200 mL/hr over 30 Minutes Intravenous 3 times per day 11/30/15 0630 11/30/15 0859         Assessment/Plan POD#6 exlap, and repair of SB perforation in setting of SBO--Dr. Hassell Done 12/01/15 Post op ileus -resolved ileus, advance diet -pulmonary toilet(refusing) -BID wet to dry dressing changes to open wound/retention sutures ID-cefepime/flagyl D#8. WBC up to 15.9k, proceed with CT a/p with PO contrast to r/u abscess.  RUE DVT-heparin gtt PCM-TPN until PO is adequate.  Dispo-may transfer to floor from a surgical standpoint    Erby Pian, [facility] Surgery Pager [phone]([phone]:[phone])   12/07/2015 8:07 AM

## 2015-12-07 NOTE — Consult Note (Signed)
WOC wound consult note Reason for Consult: Consult to evaluate nonhealing wounds to left great toe and left dorsal foot.  Seen at [facility].   Wound type:Chronic neuropathic and arterial ulcer Pressure Ulcer POA: N/A Measurement:Left great toe 1 cm x 0.5 cm x 0.2 cm nonintact with 0.5 cm devitalized tissue present circumferentially.  Left dorsal foot :  4 cm x 5.5 cm x 0.1 cm ruddy red wound bed Wound bed:as noted above Drainage (amount, consistency, odor) Minimal serosanguinous  No odor. Periwound:calloused/devitalized tissue to left great toe.  Dressing procedure/placement/frequency:Wound care to left dorsal foot and Left Great Toe: Cleanse with NS, pat gently dry.  Cover dorsal foot ulcer with xeroform gauze Kellie Simmering [phone]), cover with dry gauze 4x4.  Apply 1/8 inch bacitracin ointment to the left great toe and cover with an opened, single 2x2 inch gauze.  Secure both with a Kerlix Roll Gauze and secure with tape. Change bid. . Place foot into a Prevalon Heel boot when in bed. Will not follow at this time.  Please re-consult if needed.  Domenic Moras RN BSN [place] Pager [phone]

## 2015-12-07 NOTE — Progress Notes (Signed)
PARENTERAL NUTRITION CONSULT NOTE - FOLLOW-UP  Pharmacy Consult for TPN Indication: Bowel obstruction  Allergies  Allergen Reactions  . Zetia [Ezetimibe] Anaphylaxis and Swelling    Tongue and throat   . Atorvastatin Other (See Comments)    Malaise & muscle weakness  . Penicillins Other (See Comments)    Unknown.  Tolerates cefepime  . Pravastatin Sodium     Pravastatin 40 mg qday and 40 mg q M/W/F caused muscle aches  . Lisinopril Rash  . Promethazine Rash    Patient Measurements: Height: 6\' 2"  (188 cm) Weight: 158 lb 11.7 oz (72 kg) IBW/kg (Calculated) : 82.2 Usual Weight: 137-140 lbs  Vital Signs: Temp: 99.7 F (37.6 C) (01/12 0400) Temp Source: Oral (01/12 0400) BP: 129/63 mmHg (01/12 0600) Pulse Rate: 84 (01/12 0600) Intake/Output from previous day: 01/11 0701 - 01/12 0700 In: 2309.1 [P.O.:240; I.V.:143; IV Piggyback:350; TPN:1576.1] Out: 2100 [Urine:2100] Intake/Output from this shift:    Labs:  Recent Labs  12/06/15 0610 12/06/15 0925 12/06/15 1740 12/07/15 0445  WBC 15.2*  --  14.6* 15.9*  HGB 11.1*  --  11.0* 10.0*  HCT 32.4*  --  31.8* 29.5*  PLT 110*  --  118* 120*  APTT  --  45*  --   --   INR  --  1.89*  --   --      Recent Labs  12/05/15 0430 12/06/15 0610 12/07/15 0445  NA 142 140 141  K 4.4 4.1 4.0  CL 116* 108 110  CO2 22 24 24   GLUCOSE 165* 134* 140*  BUN 41* 49* 51*  CREATININE 1.92* 1.92* 1.89*  CALCIUM 8.6* 8.5* 8.4*  MG 1.8 1.8 1.6*  PHOS 3.4  --  4.1  PROT  --   --  5.3*  ALBUMIN  --   --  1.8*  AST  --   --  27  ALT  --   --  14*  ALKPHOS  --   --  72  BILITOT  --   --  0.9   Estimated Creatinine Clearance: 40.7 mL/min (by C-G formula based on Cr of 1.89).    Recent Labs  12/06/15 1959 12/06/15 2322 12/07/15 0327  GLUCAP 135* 128* 120*    Medical History: Past Medical History  Diagnosis Date  . Hyperlipidemia   . Hypertension   . Leg pain   . Peripheral vascular disease ([place])     s/p prior Ao-Bifem  BPG  . 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 Coronary artery disease     a. NSTEMI 7/14=> LHC 06/07/13: Proximal LAD 30%, ostial D1 30%, AV circumflex 80%, then occluded, ostial OM2 occluded, left to left collaterals, mid RCA 95%, inferior HK, EF 60%. PCI: Promus DES to the mid RCA.  No intervention was recommended for the CTO circumflex.  . Ischemic cardiomyopathy     a. Echocardiogram 06/06/13: EF 35-40%, inferior AK, mild MR.  Marland Kitchen Shortness of breath     "can come on at anytime lately; before that it was just w/exertion" (11/10/2013)  . Type II diabetes mellitus ([place])   . [place] mal seizure [facility])     "last one was ~ 3 yr ago; controlled w/daily RX" (11/10/2013)  . Arthritis     "neck, back, legs" (11/10/2013)  . Kidney stones     "when I was younger; passed w/o treatment" (11/10/2013)  . Myocardial infarction ([place])   .  Diabetic neuropathy ([place])   . H. pylori infection   . Wears glasses   . NSTEMI (non-ST elevated myocardial infarction) ([place]) 06/05/2013  . Shoulder contusion 11/07/2013    Medications:  Infusions:  . Marland KitchenTPN (CLINIMIX-E) Adult 65 mL/hr at 12/06/15 1725   And  . fat emulsion 240 mL (12/06/15 1726)  . heparin 1,450 Units/hr (12/07/15 0156)    Insulin Requirements in the past 24 hours: 11 units SSI/24h + 15 units Regular insulin in TPN  Current Nutrition: NPO. Clear liquids started 1/11  IVF: None  Central access: PICC placed 1/3 TPN start date: 1/3  ASSESSMENT                                                                                                          HPI: 41 yoM presented to ED on 12/27 with abdominal pain.  PMH includes HTN, HLD, PVD - s/p fem bypass, CAD s/p NSTEMI 2014, cardiomyopathy, DM, diabetic peripheral neuropathy, seizures, and chronic pain on chronic opioids.  Surgical history significant for open AAA repair.  CT shows partial SBO, likely adhesions.  Patient refused surgery, until 1/6 s/p  repair of perforated visous.  Plan for conservative management with TPN, bowel rest.   Significant events:  1/1: TPN per pharmacy ordered - unable to start without central access.  1/3: PICC line placed; TPN started 1/5: Acute encephalopathy with fevers (102F) overnight- sepsis protocol initiated.  Broad-spectrum abx started.  1/6: Consented to laparotomy w/ findings of perforated viscus and peritonitis. Txfer to ICU   1/10: NGT removed 1/11: started clear liquid diet  Today 12/07/2015:   Glucose: CBGs at goal of < 150mg /dl.  Insulin added to TPN. Hx DM on glipizide PTA. CBGs better with insulin adjustment in TPN 1/10  Electrolytes - Mg low, Ca at ULN;   Renal - new AKI; SCr continues to rise but plateauing.  UOP improved  I/O: +242mL, NGT = removed  LFTs - AST/ALT and Tbili wnl  TGs - 124 (1/2), 164 (1/9)  Prealbumin - 3.7 (1/2), < 2 (1/9)  NUTRITIONAL GOALS                                                                                             RD recs: 1600-1800 Kcal/day, 65-80 g protein/day, > 2 L fluid/day  Clinimix E 5/15 at a goal rate of 65 ml/hr + 20% fat emulsion at 10 ml/hr to provide: 78 g/day protein, 1588 Kcal/day.  PLAN  Continue Clinimix E 5/15 at goal rate of 65 ml/h  Continue 20% fat emulsion at 10 ml/hr  Standard MVI, MTE in TPN bag.  Continue q6h CBGs with moderate correction insulin.  Continue 15 units regular insulin in TPN  TPN lab panels on Mondays & Thursdays.    Doreene Eland, PharmD, BCPS.   Pager: [phone] 12/07/2015 7:26 AM

## 2015-12-07 NOTE — Progress Notes (Signed)
[name] PROGRESS NOTE  [name] [name] [id number] DOB: [birth year] DOA: 11/21/2015 PCP: Marijean Bravo, MD  Assessment/Plan:  65 year old man with a PMH of hyperlipidemia, hypertension, CAD, type 2 diabetes, diabetic peripheral neuropathy, seizure disorder, polysubstance abuse and chronic pain issues (on high doses of oxycodone prior to admission) who was admitted 11/22/15 with a small bowel obstruction. Surgical consultation was obtained on admission with conservative therapy initially recommended. His bowel obstruction was felt to be from adhesions and possible narcotic bowel. His NG tube was removed 11/23/15 and he seemed to be improving clinically, but pain returned 11/24/15 with diet advancement to clears. Repeat films showed worsening small bowel dilatation so his NG tube was reinserted. TPN was subsequently ordered. On 11/27/15, the patient was noted to be refusing PICC line insertion for TPN and surgical intervention. PICC finally placed 11/28/15 with initiation of TPN. On 11/30/15, the patient's condition deteriorated acutely and he was found to be confused and restless. Rapid response was called. He was febrile and tachycardic with an elevated lactate level. Due to concerns for sepsis, broad-spectrum antibiotics were initiated with vancomycin and Primaxin which was switched to cefepime given his seizure history. Underwent exploratory laparotomy on 12/01/15 with findings of perforated viscus with peritoneal contamination/peritonitis. He remained intubated postoperatively and was under the care of the critical care team. He self extubated 12/02/15. Surgery continues to follow, and has an open abdominal wound. He continues to have an ileus and is on TNA at this time. [name] will assume care 12/05/15.  SBO w/ perforated viscous and resultant peritonitis  Severe Protein calorie malnutrition - underweight, alb 1.9 On clear diet.  TPN.  Wound care per surgery  PRN dilaudid. Start  oral oxycodone.  CT abdomen today to rule out abscess.   Acute upper extremity DVT:  [place]; DVT right distal axillary vein, subclavian vein.  Discussed with surgery ok to start heparin Gtt.  PICC line removed 1-11   Severe sepsis  BP stable.  Continue with IV antibiotics.   Post op resp failure s/p exploratory laparotomy.  vent support on 1/6 , extubation: 1-6 Worsening hypoxemia in setting of pulmonary edema. Chest x ray with pulmonary interstitial infiltrates.  Respiratory failure improved with 80 mg IV lasix given 1-10.  Oxygen sat 95 on 2 L. Good urine out put last 24 hour. Hold on lasix.   Peritonitis in setting of perforation d/t SBO  Severe sepsis  Dc vanc, ct cefepime WBC continue to increased. Plan to do CT abdomen and pelvis.   Acute Encephalopathy in setting of sepsis  H/o seizure d/o-Off AEDs X 2 mo before admit-->EEG 1/5 negative.  Watch closely for evidence of seizures.  If seizure activity would load Keppra. Improved, alert and oriented.   AKI -UO improving Urine out put 5  L last 24 hours.  Follow cr trend. Cr decrease to 1.8. Holding fluids due to pulmonary edema.   H/o cad  S/p left fem pop bypass w/ non-healing ulcer  Neutropenia - resolved Dilutional anemia w/expected post-op blood loss   Type II DM SSI.   Wound left LE; will consult wound care.   Code Status: Full Code.  Family Communication: none at bedside, care discussed with patient.  Disposition Plan: Remain in the step down.    Consultants:  Surgery   CCM transfer care to [facility] 1[facility]10   Procedures: exploratory laparotomy on 12/01/15   Antibiotics:  Flagyl 11-30-2015  Cefepime 12-02-2015  HPI/Subjective: Alert, oriented, appears less toxic.  Better spirit. Having  BM. Complaining of abdominal pain, incision site. He used to take oxycodone 30 mg at home.  He is breathing better.   Objective: Filed Vitals:   12/07/15 0600 12/07/15 0800  BP: 129/63   Pulse: 84   Temp:   97.8 F (36.6 C)  Resp: 18     Intake/Output Summary (Last 24 hours) at 12/07/15 0841 Last data filed at 12/07/15 0500  Gross per 24 hour  Intake 2234.07 ml  Output   2100 ml  Net 134.07 ml   Filed Weights   12/02/15 0425 12/02/15 0651 12/06/15 0400  Weight: 72.3 kg (159 lb 6.3 oz) 69.5 kg (153 lb 3.5 oz) 72 kg (158 lb 11.7 oz)    Exam:   General:   No acute distress. Less toxic appearing/   Cardiovascular: S 1, S 2 RRR  Respiratory: good air sound on the right, no significant crackles.   Abdomen: decrease Bowel sound, wound mid abdomen with clean dressing.   Musculoskeletal: scd , no LE edema. Right arm with swelling   Data Reviewed: Basic Metabolic Panel:  Recent Labs Lab 12/02/15 0508  12/03/15 0545 12/04/15 0447 12/05/15 0430 12/06/15 0610 12/07/15 0445  NA 132*  < > 141 141 142 140 141  K 5.5*  < > 3.8 4.4 4.4 4.1 4.0  CL 105  < > 112* 115* 116* 108 110  CO2 22  < > 21* 22 22 24 24   GLUCOSE 719*  < > 154* 161* 165* 134* 140*  BUN 29*  < > 39* 42* 41* 49* 51*  CREATININE 1.62*  < > 1.89* 1.88* 1.92* 1.92* 1.89*  CALCIUM 7.7*  < > 7.8* 8.4* 8.6* 8.5* 8.4*  MG 2.2  --  1.6* 2.0 1.8 1.8 1.6*  PHOS 6.3*  --  1.9* 3.4 3.4  --  4.1  < > = values in this interval not displayed. Liver Function Tests:  Recent Labs Lab 12/04/15 0447 12/07/15 0445  AST 30 27  ALT 15* 14*  ALKPHOS 66 72  BILITOT 0.5 0.9  PROT 4.6* 5.3*  ALBUMIN 1.9* 1.8*   No results for input(s): LIPASE, AMYLASE in the last 168 hours.  Recent Labs Lab 11/30/15 1020 12/01/15 0315  AMMONIA 69* 38*   CBC:  Recent Labs Lab 12/01/15 0315  12/02/15 0508 12/04/15 0447 12/06/15 0610 12/06/15 1740 12/07/15 0445  WBC 3.3*  --  5.7 10.7* 15.2* 14.6* 15.9*  NEUTROABS 2.6  --   --  8.0*  --   --   --   HGB 16.1  < > 12.4* 11.0* 11.1* 11.0* 10.0*  HCT 48.0  < > 37.8* 32.8* 32.4* 31.8* 29.5*  MCV 87.9  --  91.7 87.5 84.6 84.6 85.0  PLT PLATELET CLUMPS NOTED ON SMEAR, UNABLE TO  ESTIMATE  --  123* 133* 110* 118* 120*  < > = values in this interval not displayed. Cardiac Enzymes:  Recent Labs Lab 11/30/15 1020  CKTOTAL 103   BNP (last 3 results) No results for input(s): BNP in the last 8760 hours.  ProBNP (last 3 results) No results for input(s): PROBNP in the last 8760 hours.  CBG:  Recent Labs Lab 12/06/15 1656 12/06/15 1959 12/06/15 2322 12/07/15 0327 12/07/15 0737  GLUCAP 156* 135* 128* 120* 146*    Recent Results (from the past 240 hour(s))  Culture, Urine     Status: None   Collection Time: 11/30/15  6:21 AM  Result Value Ref Range Status   Specimen Description URINE, CLEAN  CATCH  Final   Special Requests NONE  Final   Culture   Final    NO GROWTH 1 DAY Performed at [facility]    Report Status 12/01/2015 FINAL  Final  Culture, blood (Routine X 2) w Reflex to ID Panel     Status: None   Collection Time: 11/30/15  6:31 AM  Result Value Ref Range Status   Specimen Description BLOOD LEFT ARM  Final   Special Requests IN PEDIATRIC BOTTLE [place]  Final   Culture   Final    NO GROWTH 5 DAYS Performed at [facility]    Report Status 12/05/2015 FINAL  Final  Culture, blood (Routine X 2) w Reflex to ID Panel     Status: None   Collection Time: 11/30/15  6:45 AM  Result Value Ref Range Status   Specimen Description BLOOD RIGHT PICC LINE  Final   Special Requests BOTTLES DRAWN AEROBIC AND ANAEROBIC 10CC  Final   Culture   Final    NO GROWTH 5 DAYS Performed at [facility]    Report Status 12/05/2015 FINAL  Final  MRSA PCR Screening     Status: None   Collection Time: 12/04/15  9:57 AM  Result Value Ref Range Status   MRSA by PCR NEGATIVE NEGATIVE Final    Comment:        The GeneXpert MRSA Assay (FDA approved for NASAL specimens only), is one component of a comprehensive MRSA colonization surveillance program. It is not intended to diagnose MRSA infection nor to guide or monitor treatment for MRSA  infections.      Studies: Dg Chest Port 1 View  12/06/2015  CLINICAL DATA:  Encounter for central line placement. EXAM: PORTABLE CHEST 1 VIEW COMPARISON:  December 05, 2015. FINDINGS: The heart size and mediastinal contours are within normal limits. No pneumothorax is noted. Stable mild interstitial densities are noted throughout both lungs consistent with pulmonary edema. Stable right basilar opacity is noted concerning for atelectasis with associated pleural effusion. Nasogastric tube has been removed. Right-sided PICC line is again noted with tip at cavoatrial junction. Interval placement of left internal jugular catheter with distal tip in SVC. The visualized skeletal structures are unremarkable. IMPRESSION: Stable bilateral pulmonary edema, with right basilar subsegmental atelectasis and associated pleural effusion. Interval placement of left internal jugular catheter with distal tip in SVC. No pneumothorax is noted. Electronically Signed   By: Marijo Conception, M.D.   On: 12/06/2015 16:10   Dg Chest Port 1 View  12/05/2015  CLINICAL DATA:  Small bowel obstruction, acute onset of hypoxia ; history of diabetes, coronary artery disease with stent placement, peripheral vascular disease, current smoker. EXAM: PORTABLE CHEST 1 VIEW COMPARISON:  Chest x-ray of December 03, 2015 FINDINGS: Acute worsening of pulmonary interstitial density has occurred bilaterally. The right hemidiaphragm is now obscured. There is partial obscuration of the left hemidiaphragm. There are small bilateral pleural effusions. The heart is not enlarged. The pulmonary vascularity is not clearly engorged. The right-sided PICC line tip projects over the distal third of the SVC. The esophagogastric tube tip projects below the inferior margin of the image. IMPRESSION: Worsening of pulmonary interstitial infiltrates bilaterally. This may reflect pulmonary edema of cardiac or noncardiac cause or could reflect pneumonia. There are small  bilateral pleural effusions. Electronically Signed   By: David  Martinique M.D.   On: 12/05/2015 08:52    Scheduled Meds: . antiseptic oral rinse  7 mL Mouth Rinse q12n4p  .  bacitracin   Topical Daily  . ceFEPime (MAXIPIME) IV  1 g Intravenous Q12H  . chlorhexidine  15 mL Mouth Rinse BID  . collagenase  1 application Topical Daily  . insulin aspart  0-15 Units Subcutaneous 6 times per day  . iohexol  25 mL Oral Q1 Hr x 2  . lip balm  1 application Topical BID  . magnesium sulfate 1 - 4 g bolus IVPB  2 g Intravenous Once  . pantoprazole (PROTONIX) IV  40 mg Intravenous Q12H  . sodium chloride  3 mL Intravenous Q12H  . thiamine  100 mg Oral Daily   Or  . thiamine  100 mg Intravenous Daily   Continuous Infusions: . Marland KitchenTPN (CLINIMIX-E) Adult 65 mL/hr at 12/06/15 1725   And  . fat emulsion 240 mL (12/06/15 1726)  . 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     Niel Hummer A  [name] Pager [phone]. If 7PM-7AM, please contact night-coverage at [email or web address], password [facility] 12/07/2015, 8:41 AM  LOS: 16 days

## 2015-12-07 NOTE — Progress Notes (Signed)
ANTICOAGULATION CONSULT NOTE - F/u Consult  Pharmacy Consult for heparin Indication: DVT  Allergies  Allergen Reactions  . Zetia [Ezetimibe] Anaphylaxis and Swelling    Tongue and throat   . Atorvastatin Other (See Comments)    Malaise & muscle weakness  . Penicillins Other (See Comments)    Unknown.  Tolerates cefepime  . Pravastatin Sodium     Pravastatin 40 mg qday and 40 mg q M/W/F caused muscle aches  . Lisinopril Rash  . Promethazine Rash    Patient Measurements: Height: 6\' 2"  (188 cm) Weight: 158 lb 11.7 oz (72 kg) IBW/kg (Calculated) : 82.2 Heparin Dosing Weight: 72kg  Vital Signs: Temp: 99.4 F (37.4 C) (01/12 0003) Temp Source: Oral (01/12 0003) BP: 177/92 mmHg (01/12 0000) Pulse Rate: 88 (01/12 0000)  Labs:  Recent Labs  12/04/15 0447 12/05/15 0430 12/06/15 0610 12/06/15 0925 12/06/15 1740 12/06/15 2328  HGB 11.0*  --  11.1*  --  11.0*  --   HCT 32.8*  --  32.4*  --  31.8*  --   PLT 133*  --  110*  --  118*  --   APTT  --   --   --  45*  --   --   LABPROT  --   --   --  21.6*  --   --   INR  --   --   --  1.89*  --   --   HEPARINUNFRC  --   --   --   --   --  <0.10*  CREATININE 1.88* 1.92* 1.92*  --   --   --     Estimated Creatinine Clearance: 40.1 mL/min (by C-G formula based on Cr of 1.92).   Medical History: Past Medical History  Diagnosis Date  . Hyperlipidemia   . Hypertension   . Leg pain   . Peripheral vascular disease ([place])     s/p prior Ao-Bifem BPG  . 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 Coronary artery disease     a. NSTEMI 7/14=> LHC 06/07/13: Proximal LAD 30%, ostial D1 30%, AV circumflex 80%, then occluded, ostial OM2 occluded, left to left collaterals, mid RCA 95%, inferior HK, EF 60%. PCI: Promus DES to the mid RCA.  No intervention was recommended for the CTO circumflex.  . Ischemic cardiomyopathy     a. Echocardiogram 06/06/13: EF 35-40%, inferior AK, mild MR.  Marland Kitchen  Shortness of breath     "can come on at anytime lately; before that it was just w/exertion" (11/10/2013)  . Type II diabetes mellitus ([place])   . [place] mal seizure [facility])     "last one was ~ 3 yr ago; controlled w/daily RX" (11/10/2013)  . Arthritis     "neck, back, legs" (11/10/2013)  . Kidney stones     "when I was younger; passed w/o treatment" (11/10/2013)  . Myocardial infarction ([place])   . Diabetic neuropathy ([place])   . H. pylori infection   . Wears glasses   . NSTEMI (non-ST elevated myocardial infarction) ([place]) 06/05/2013  . Shoulder contusion 11/07/2013    Assessment: 13 YOM known to pharmacy for TPN management and antibiotics dosing.  He is s/p ex lap on 1/6 and found to have perforated viscous and peritonitis.  RUE doppler 1/11 reveals DVT at and/or near PICC site.    12/06/2015  On enoxaparin 40mg  q24h - last dose 1/10 17:00  CBC: anemia - relatively stable, thrombocytopenia worse this am but > 100  AKI - SCr stable with CrCl > 16ml/min  2328 HL=<0.10, no infusion or bleeding problems per RN  Goal of Therapy:  Heparin level 0.3-0.7 units/ml Monitor platelets by anticoagulation protocol: Yes   Plan:   Increase heparin drip to 1450 units/hr  Check 6 hour level  Daily CBC and heparin level   Dorrene German 12/07/2015 1:49 AM

## 2015-12-07 NOTE — Progress Notes (Signed)
Pharmacy Consult Note - Heparin Follow Up  Labs: heparin level < 0.10  A/P: Heparin level subtherapeutic (goal 0.3-0.7) on current rate of 1600 units/hr. No problems per RN. Lab had to be drawn from central line and RN noted that heparin was stopped, line flushed, prior to drawing level so unsure why level was still subtherapeutic. Will not bolus patient due to being post op. Increase rate to 1800 units/hr and recheck level in AM  Adrian Saran, PharmD, BCPS Pager [phone] 12/07/2015 9:25 PM

## 2015-12-07 NOTE — Progress Notes (Signed)
PULMONARY / CRITICAL CARE MEDICINE   Name: [name] [name] MRN: [id number] DOB: 01/01/1951    ADMISSION DATE:  11/21/2015 CONSULTATION DATE:  1/6  REFERRING MD:  Hassell Done   CHIEF COMPLAINT:    HISTORY OF PRESENT ILLNESS:   65 year old aam who was admitted on 12/27 w/ abd pain found to be 2/2 SBO on CT scan. Initially managed conservatively, but then on 1/1 was offered surgery given no significant improvement. He refused surgery for several days. His hospital course was complicated by fever spike and delirium on 1/5. At that time has abd was more distended. He had an EEG which was did not show seizures. He finally agreed to surgery on 1/6 d/t no resolution of abd pain. He went for exploratory lap which was notable for significant peritoneal contamination. He is s/p exploration, washout and repair of perforated viscous. PCCM was asked to assess post op and assist w/ post op critical care support.   PMH of  hypertension, CAD, type 2 diabetes, diabetic peripheral neuropathy, seizure disorder, polysubstance abuse and chronic pain issues   Significant tests/ events 1/10 diuresed 5L with lasix 1/11 duplex >. RUE DVT along PICC, dc'd PICC  1/11 [facility] >>    SUBJECTIVE:  UO ok Pain mproved with meds Afebrile on lower O2  VITAL SIGNS: BP 129/63 mmHg  Pulse 84  Temp(Src) 97.8 F (36.6 C) (Oral)  Resp 18  Ht 6\' 2"  (1.88 m)  Wt 158 lb 11.7 oz (72 kg)  BMI 20.37 kg/m2  SpO2 97%  HEMODYNAMICS:    VENTILATOR SETTINGS:    INTAKE / OUTPUT: I/O last 3 completed shifts: In: 3409.1 [P.O.:240; I.V.:143; IV Piggyback:550] Out: 4200 [Urine:4200]  PHYSICAL EXAMINATION: General:  Chronically ill appearing AAM.  Neuro:  Awake, interactive, oriented to name & place Generalized weakness  HEENT:  NCAT, NRB Cardiovascular:  Tachy rrr; no MRG Lungs:  Clear, equal rise on vent. Crackles bases  Abdomen:  Distended. Mid abd dressing CD&I Musculoskeletal:  Equal st and bulk  Skin:  Dry and  intact. LLE non healing ulcer dressing   LABS:  BMET  Recent Labs Lab 12/05/15 0430 12/06/15 0610 12/07/15 0445  NA 142 140 141  K 4.4 4.1 4.0  CL 116* 108 110  CO2 22 24 24   BUN 41* 49* 51*  CREATININE 1.92* 1.92* 1.89*  GLUCOSE 165* 134* 140*    Electrolytes  Recent Labs Lab 12/04/15 0447 12/05/15 0430 12/06/15 0610 12/07/15 0445  CALCIUM 8.4* 8.6* 8.5* 8.4*  MG 2.0 1.8 1.8 1.6*  PHOS 3.4 3.4  --  4.1    CBC  Recent Labs Lab 12/06/15 0610 12/06/15 1740 12/07/15 0445  WBC 15.2* 14.6* 15.9*  HGB 11.1* 11.0* 10.0*  HCT 32.4* 31.8* 29.5*  PLT 110* 118* 120*    Coag's  Recent Labs Lab 12/06/15 0925  APTT 45*  INR 1.89*    Sepsis Markers  Recent Labs Lab 11/30/15 1020 11/30/15 1300  LATICACIDVEN 1.8 1.8    ABG  Recent Labs Lab 12/01/15 1347 12/01/15 1527 12/02/15 0115  PHART 7.193* 7.330* 7.311*  PCO2ART 55.6* 39.1 40.4  PO2ART 220.0* 209* 81.7    Liver Enzymes  Recent Labs Lab 12/04/15 0447 12/07/15 0445  AST 30 27  ALT 15* 14*  ALKPHOS 66 72  BILITOT 0.5 0.9  ALBUMIN 1.9* 1.8*    Cardiac Enzymes No results for input(s): TROPONINI, PROBNP in the last 168 hours.  Glucose  Recent Labs Lab 12/06/15 1656 12/06/15 1959 12/06/15 2322  12/07/15 0327 12/07/15 0737 12/07/15 0851  GLUCAP 156* 135* 128* 120* 146* 154*    Imaging Dg Chest Port 1 View  12/06/2015  CLINICAL DATA:  Encounter for central line placement. EXAM: PORTABLE CHEST 1 VIEW COMPARISON:  December 05, 2015. FINDINGS: The heart size and mediastinal contours are within normal limits. No pneumothorax is noted. Stable mild interstitial densities are noted throughout both lungs consistent with pulmonary edema. Stable right basilar opacity is noted concerning for atelectasis with associated pleural effusion. Nasogastric tube has been removed. Right-sided PICC line is again noted with tip at cavoatrial junction. Interval placement of left internal jugular catheter  with distal tip in SVC. The visualized skeletal structures are unremarkable. IMPRESSION: Stable bilateral pulmonary edema, with right basilar subsegmental atelectasis and associated pleural effusion. Interval placement of left internal jugular catheter with distal tip in SVC. No pneumothorax is noted. Electronically Signed   By: Marijo Conception, M.D.   On: 12/06/2015 16:10     STUDIES:  1/5 EEG: no seizures. C/w metabolic changes/meds  CULTURES: BCX2 1/5>>>ng  ANTIBIOTICS: Cefepime 1/5>>> Flagyl 1/5>>> vanc 1/4>>> 1/9 Imipenem 1/4>>>1/5 (stopped d/t concern for seizure)    LINES/TUBES: RUE PICC 1/3>>> OETT 1/6>>>1/6   DISCUSSION: 65 year old male admitted 12/27 for SBO. Initially managed medically but when sxs not improved surgery was recommended on 1/1; the pt refused @ that time. He presents to the ICU on full vent support on 1/6 after abd exploration where he was found to have perforated viscous and peritonitis. He underwent washout & repair.  Course complicated by ileus & RUE DVT  ASSESSMENT / PLAN:  PULMONARY A: POst op resp failure s/p exploratory laparotomy , continues to require high O2 - ? Edema vs atelectasis - improved with diuresis H/o nicotine abuse  P:  Ct  [place] IS q 2h   RENAL A:   AKI -UO improving P:   Renal dose meds  Rpt Lasix 40 x 1  GASTROINTESTINAL A:   SBO w/ perforated viscous and resultant peritonitis  Ileus Severe Protein calorie malnutrition - underweight, alb 1.9  P:   Tolerating liquids Cont TPN until PO intake adequate Wound care per surgery  PRN Fentanyl   HEMATOLOGIC A:   Neutropenia - resolved Dilutional anemia w/expected post-op blood loss  Thrombocytopenia RUE DVT , PICC dc'd P:  Trend CBC Monitor plts on heparin Start IV heparin    INFECTIOUS A:   Peritonitis in setting of perforation d/t SBO -High risk abscess formation Severe sepsis  P:   Dc vanc, ct cefepime x 10-14 days CT abdomen at some  point   NEUROLOGIC A:   Acute Encephalopathy in setting of sepsis  H/o seizure d/o-Off AEDs X 2 mo before admit-->EEG 1/5 negative. Severe deconditioning  P:   Try to mobilise with PT Watch closely for evidence of seizures.  If seizure activity would load Keppra   FAMILY  - Updates: none available  - Inter-disciplinary family meet or Palliative Care meeting due by:  1/13  Summary -Expect prolonged course with peritonitis,advancing PO , delerium & oliguria , hypoxia - improved, RUE DVT & malnutriton are present concerns     Rigoberto Noel. MD   12/07/2015, 9:14 AM

## 2015-12-07 NOTE — Progress Notes (Addendum)
ANTICOAGULATION CONSULT NOTE - F/u Consult  Pharmacy Consult for heparin Indication: DVT  Allergies  Allergen Reactions  . Zetia [Ezetimibe] Anaphylaxis and Swelling    Tongue and throat   . Atorvastatin Other (See Comments)    Malaise & muscle weakness  . Penicillins Other (See Comments)    Unknown.  Tolerates cefepime  . Pravastatin Sodium     Pravastatin 40 mg qday and 40 mg q M/W/F caused muscle aches  . Lisinopril Rash  . Promethazine Rash    Patient Measurements: Height: 6\' 2"  (188 cm) Weight: 158 lb 11.7 oz (72 kg) IBW/kg (Calculated) : 82.2 Heparin Dosing Weight: 72kg  Vital Signs: Temp: 97.8 F (36.6 C) (01/12 0800) Temp Source: Oral (01/12 0800) BP: 129/63 mmHg (01/12 0600) Pulse Rate: 84 (01/12 0600)  Labs:  Recent Labs  12/05/15 0430  12/06/15 0610 12/06/15 0925 12/06/15 1740 12/06/15 2328 12/07/15 0445 12/07/15 0845  HGB  --   < > 11.1*  --  11.0*  --  10.0*  --   HCT  --   --  32.4*  --  31.8*  --  29.5*  --   PLT  --   --  110*  --  118*  --  120*  --   APTT  --   --   --  45*  --   --   --   --   LABPROT  --   --   --  21.6*  --   --   --   --   INR  --   --   --  1.89*  --   --   --   --   HEPARINUNFRC  --   --   --   --   --  <0.10*  --  0.21*  CREATININE 1.92*  --  1.92*  --   --   --  1.89*  --   < > = values in this interval not displayed.  Estimated Creatinine Clearance: 40.7 mL/min (by C-G formula based on Cr of 1.89).  Assessment: 40 YOM known to pharmacy for TPN management and antibiotic dosing.  He is s/p ex lap on 1/6 and found to have perforated viscous and peritonitis.  RUE doppler 1/11 reveals DVT of RUE along PICC site.  PICC removed 1/11 and Left IJ placed.   Note baseline INR = 1.89, aPTT = 45sec Today, 12/07/2015   Heparin level recheck reveals subtherapeutic level following rate increase to 1450 units/hr this am  CBC: anemia - decreased to 10g/dl, thrombocytopenia stable (> 100K)  AKI - SCr stable with CrCl >  55ml/min  No bleeding noted  Goal of Therapy:  Heparin level 0.3-0.7 units/ml Monitor platelets by anticoagulation protocol: Yes   Plan:   Increase heparin drip to 1600 units/hr  Check 6 hour level  Daily CBC and heparin level  Doreene Eland, PharmD, BCPS.   Pager: [phone] 12/07/2015 9:17 AM

## 2015-12-07 NOTE — Progress Notes (Signed)
Physical Therapy Treatment Patient Details Name: MANIT PUGLIANO MRN: [id number] DOB: [birth year] Today's Date: 12/07/2015    History of Present Illness 65 year old aam who was admitted on 12/27 w/ abd pain found to be 2/2 SBO on CT scan. Marland Kitchen His hospital course was complicated by fever spike and delirium on 1/5. At that time has abd was more distended.  He went for exploratory lap 12/01/15 which was notable for significant peritoneal contamination. He is s/p exploration, washout and repair of perforated viscous    PT Comments    The patient is much more alert and able to converse today. Patient presents with PROFOUND WEAKNESS OF BOTH LEGS. Noted to flex the hip/knee weakly in supine and unable to extend the legs back out. Attempted to facilitate knee extension on both legs  By various  Strategies- Place and hold  The legs with knee extended with trace to no quad activity. Also tested in sitting with  Inability to initiate knee extension. Testing of sensation of the legs was inconsistent. Patient also demonstrates poor trunk control sitting but  patient had prolonged hospital  Stay and sat up first time today  On side of the bed. Will need to further assess sensation, strength and trunk strength.  Follow Up Recommendations  SNF;Supervision/Assistance - 24 hour     Equipment Recommendations  None recommended by PT    Recommendations for Other Services       Precautions / Restrictions Precautions Precautions: Fall Precaution Comments: monitor oxygen and HR, profound Leg weakness, does not  have quad strength.    Mobility  Bed Mobility Overal bed mobility: Needs Assistance Bed Mobility: Sidelying to Sit;Sit to Sidelying   Sidelying to sit: Max assist;+2 for physical assistance;+2 for safety/equipment;HOB elevated     Sit to sidelying: Max assist;+2 for physical assistance;+2 for safety/equipment General bed mobility comments: patient is unable to move legs across the bed to get legs to  the edge. assist with trunk, holds to mattress edge to  assist pulling trunk to upright.   Once in sitting patient's trunk is flexed, holdong onto mattress for  support with close guarding. Patient sat for about 4 minutes, sats dropped  into 80's, HR around 110's. Patient again unable to initiate knee extension while sitting at the edge, note  abduction  of the  left leg/sliding to the left with left knee  extension attempts.  Transfers                    Ambulation/Gait                 Stairs            Wheelchair Mobility    Modified Rankin (Stroke Patients Only)       Balance Overall balance assessment: Needs assistance Sitting-balance support: Bilateral upper extremity supported;Feet supported Sitting balance-Leahy Scale: Poor   Postural control: Posterior lean                          Cognition Arousal/Alertness: Awake/alert   Overall Cognitive Status: Within Functional Limits for tasks assessed                 General Comments: much improved in MS, engaging.    Exercises      General Comments        Pertinent Vitals/Pain Pain Score: 9  Pain Location: abdome Pain Descriptors / Indicators: Discomfort;Grimacing Pain Intervention(s): Premedicated before session  Home Living                      Prior Function            PT Goals (current goals can now be found in the care plan section) Acute Rehab PT Goals Patient Stated Goal: to wal PT Goal Formulation: With patient Potential to Achieve Goals: Fair Progress towards PT goals: Progressing toward goals    Frequency  Min 3X/week    PT Plan Current plan remains appropriate    Co-evaluation             End of Session   Activity Tolerance: Patient limited by fatigue;Treatment limited secondary to medical complications (Comment) Patient left: in bed;with call bell/phone within reach;with bed alarm set     Time: [id number] PT Time Calculation (min)  (ACUTE ONLY): 29 min  Charges:  $Therapeutic Activity: 23-37 mins                    G Codes:      Claretha Cooper 12/07/2015, 4:42 PM Tresa Endo PT [phone]

## 2015-12-08 ENCOUNTER — Inpatient Hospital Stay (HOSPITAL_COMMUNITY): Payer: Medicaid Other

## 2015-12-08 DIAGNOSIS — Z9889 Other specified postprocedural states: Secondary | ICD-10-CM

## 2015-12-08 DIAGNOSIS — I82621 Acute embolism and thrombosis of deep veins of right upper extremity: Secondary | ICD-10-CM

## 2015-12-08 DIAGNOSIS — Z515 Encounter for palliative care: Secondary | ICD-10-CM | POA: Diagnosis present

## 2015-12-08 LAB — GLUCOSE, CAPILLARY
GLUCOSE-CAPILLARY: 118 mg/dL — AB (ref 65–99)
GLUCOSE-CAPILLARY: 153 mg/dL — AB (ref 65–99)
Glucose-Capillary: 108 mg/dL — ABNORMAL HIGH (ref 65–99)
Glucose-Capillary: 125 mg/dL — ABNORMAL HIGH (ref 65–99)
Glucose-Capillary: 139 mg/dL — ABNORMAL HIGH (ref 65–99)
Glucose-Capillary: 169 mg/dL — ABNORMAL HIGH (ref 65–99)
Glucose-Capillary: 171 mg/dL — ABNORMAL HIGH (ref 65–99)

## 2015-12-08 LAB — BASIC METABOLIC PANEL
ANION GAP: 7 (ref 5–15)
BUN: 52 mg/dL — ABNORMAL HIGH (ref 6–20)
CALCIUM: 8.3 mg/dL — AB (ref 8.9–10.3)
CO2: 26 mmol/L (ref 22–32)
Chloride: 104 mmol/L (ref 101–111)
Creatinine, Ser: 1.68 mg/dL — ABNORMAL HIGH (ref 0.61–1.24)
GFR calc Af Amer: 48 mL/min — ABNORMAL LOW (ref >60)
GFR, EST NON AFRICAN AMERICAN: 42 mL/min — AB (ref >60)
GLUCOSE: 151 mg/dL — AB (ref 65–99)
Potassium: 3.9 mmol/L (ref 3.5–5.1)
Sodium: 137 mmol/L (ref 135–145)

## 2015-12-08 LAB — PROTIME-INR
INR: 1.82 — AB (ref 0.00–1.49)
PROTHROMBIN TIME: 21 s — AB (ref 11.6–15.2)

## 2015-12-08 LAB — CBC
HEMATOCRIT: 31.7 % — AB (ref 39.0–52.0)
Hemoglobin: 10.8 g/dL — ABNORMAL LOW (ref 13.0–17.0)
MCH: 29.1 pg (ref 26.0–34.0)
MCHC: 34.1 g/dL (ref 30.0–36.0)
MCV: 85.4 fL (ref 78.0–100.0)
Platelets: 129 10*3/uL — ABNORMAL LOW (ref 150–400)
RBC: 3.71 MIL/uL — ABNORMAL LOW (ref 4.22–5.81)
RDW: 14.9 % (ref 11.5–15.5)
WBC: 17.6 10*3/uL — AB (ref 4.0–10.5)

## 2015-12-08 LAB — HEPARIN LEVEL (UNFRACTIONATED)
Heparin Unfractionated: 0.41 IU/mL (ref 0.30–0.70)
Heparin Unfractionated: 0.4 IU/mL (ref 0.30–0.70)

## 2015-12-08 MED ORDER — LACTATED RINGERS IV SOLN
INTRAVENOUS | Status: DC
  Administered 2015-12-08 – 2015-12-16 (×9): via INTRAVENOUS

## 2015-12-08 MED ORDER — OXYCODONE HCL 5 MG PO TABS
10.0000 mg | ORAL_TABLET | ORAL | Status: DC | PRN
  Administered 2015-12-08 – 2015-12-20 (×25): 10 mg via ORAL
  Filled 2015-12-08 (×25): qty 2

## 2015-12-08 MED ORDER — FAT EMULSION 20 % IV EMUL
240.0000 mL | INTRAVENOUS | Status: AC
  Administered 2015-12-08: 240 mL via INTRAVENOUS
  Filled 2015-12-08: qty 250

## 2015-12-08 MED ORDER — SODIUM CHLORIDE 0.9 % IJ SOLN
10.0000 mL | INTRAMUSCULAR | Status: DC | PRN
  Administered 2015-12-10 – 2015-12-26 (×11): 10 mL
  Administered 2015-12-29: 20 mL
  Administered 2015-12-30: 30 mL
  Filled 2015-12-08 (×13): qty 40

## 2015-12-08 MED ORDER — FENTANYL CITRATE (PF) 100 MCG/2ML IJ SOLN
INTRAMUSCULAR | Status: AC | PRN
  Administered 2015-12-08 (×2): 50 ug via INTRAVENOUS

## 2015-12-08 MED ORDER — FUROSEMIDE 10 MG/ML IJ SOLN
40.0000 mg | Freq: Once | INTRAMUSCULAR | Status: AC
  Administered 2015-12-08: 40 mg via INTRAVENOUS
  Filled 2015-12-08: qty 4

## 2015-12-08 MED ORDER — FENTANYL CITRATE (PF) 100 MCG/2ML IJ SOLN
INTRAMUSCULAR | Status: AC
  Filled 2015-12-08: qty 4

## 2015-12-08 MED ORDER — TRACE MINERALS CR-CU-MN-SE-ZN 10-1000-500-60 MCG/ML IV SOLN
INTRAVENOUS | Status: AC
  Administered 2015-12-08: 17:00:00 via INTRAVENOUS
  Filled 2015-12-08: qty 1560

## 2015-12-08 MED ORDER — METRONIDAZOLE IN NACL 5-0.79 MG/ML-% IV SOLN
500.0000 mg | Freq: Four times a day (QID) | INTRAVENOUS | Status: DC
  Administered 2015-12-08 – 2015-12-15 (×28): 500 mg via INTRAVENOUS
  Filled 2015-12-08 (×31): qty 100

## 2015-12-08 MED ORDER — MIDAZOLAM HCL 2 MG/2ML IJ SOLN
INTRAMUSCULAR | Status: AC | PRN
  Administered 2015-12-08 (×5): 1 mg via INTRAVENOUS

## 2015-12-08 MED ORDER — MIDAZOLAM HCL 2 MG/2ML IJ SOLN
INTRAMUSCULAR | Status: AC
  Filled 2015-12-08: qty 6

## 2015-12-08 NOTE — Sedation Documentation (Signed)
Resting with eyes closed.

## 2015-12-08 NOTE — Progress Notes (Signed)
Spoke to pt's brother. Family mtg set up for 230 pm 12/09/15. Thank you, Gwynneth Munson

## 2015-12-08 NOTE — Progress Notes (Signed)
PARENTERAL NUTRITION CONSULT NOTE - FOLLOW-UP  Pharmacy Consult for TPN Indication: Bowel obstruction  Allergies  Allergen Reactions  . Zetia [Ezetimibe] Anaphylaxis and Swelling    Tongue and throat   . Atorvastatin Other (See Comments)    Malaise & muscle weakness  . Penicillins Other (See Comments)    Unknown.  Tolerates cefepime  . Pravastatin Sodium     Pravastatin 40 mg qday and 40 mg q M/W/F caused muscle aches  . Lisinopril Rash  . Promethazine Rash    Patient Measurements: Height: 6\' 2"  (188 cm) Weight: 157 lb 10.1 oz (71.5 kg) IBW/kg (Calculated) : 82.2 Usual Weight: 137-140 lbs  Vital Signs: Temp: 98.2 F (36.8 C) (01/13 0809) Temp Source: Oral (01/13 0809) BP: 114/50 mmHg (01/13 0900) Pulse Rate: 74 (01/13 0900) Intake/Output from previous day: 01/12 0701 - 01/13 0700 In: 2775.6 [P.O.:120; I.V.:704.4; IV Piggyback:150; TPN:1801.3] Out: 3775 [Urine:3775] Intake/Output from this shift: Total I/O In: 306 [I.V.:56; IV Piggyback:100; TPN:150] Out: -   Labs:  Recent Labs  12/06/15 0925 12/06/15 1740 12/07/15 0445 12/08/15 0340  WBC  --  14.6* 15.9* 17.6*  HGB  --  11.0* 10.0* 10.8*  HCT  --  31.8* 29.5* 31.7*  PLT  --  118* 120* 129*  APTT 45*  --   --   --   INR 1.89*  --   --  1.82*     Recent Labs  12/06/15 0610 12/07/15 0445 12/08/15 0340  NA 140 141 137  K 4.1 4.0 3.9  CL 108 110 104  CO2 24 24 26   GLUCOSE 134* 140* 151*  BUN 49* 51* 52*  CREATININE 1.92* 1.89* 1.68*  CALCIUM 8.5* 8.4* 8.3*  MG 1.8 1.6*  --   PHOS  --  4.1  --   PROT  --  5.3*  --   ALBUMIN  --  1.8*  --   AST  --  27  --   ALT  --  14*  --   ALKPHOS  --  72  --   BILITOT  --  0.9  --    Estimated Creatinine Clearance: 45.5 mL/min (by C-G formula based on Cr of 1.68).    Recent Labs  12/08/15 0007 12/08/15 0422 12/08/15 0755  GLUCAP 171* 139* 118*    Medical History: Past Medical History  Diagnosis Date  . Hyperlipidemia   . Hypertension   . Leg  pain   . Peripheral vascular disease ([place])     s/p prior Ao-Bifem BPG  . 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 Coronary artery disease     a. NSTEMI 7/14=> LHC 06/07/13: Proximal LAD 30%, ostial D1 30%, AV circumflex 80%, then occluded, ostial OM2 occluded, left to left collaterals, mid RCA 95%, inferior HK, EF 60%. PCI: Promus DES to the mid RCA.  No intervention was recommended for the CTO circumflex.  . Ischemic cardiomyopathy     a. Echocardiogram 06/06/13: EF 35-40%, inferior AK, mild MR.  Marland Kitchen Shortness of breath     "can come on at anytime lately; before that it was just w/exertion" (11/10/2013)  . Type II diabetes mellitus ([place])   . [place] mal seizure [facility])     "last one was ~ 3 yr ago; controlled w/daily RX" (11/10/2013)  . Arthritis     "neck, back, legs" (11/10/2013)  . Kidney stones     "when I was younger; passed  w/o treatment" (11/10/2013)  . Myocardial infarction ([place])   . Diabetic neuropathy ([place])   . H. pylori infection   . Wears glasses   . NSTEMI (non-ST elevated myocardial infarction) ([place]) 06/05/2013  . Shoulder contusion 11/07/2013    Medications:  Infusions:  . Marland KitchenTPN (CLINIMIX-E) Adult 65 mL/hr at 12/07/15 1732   And  . fat emulsion 240 mL (12/07/15 1732)  . heparin Stopped (12/08/15 0900)    Insulin Requirements in the past 24 hours: 11 units SSI/24h + 15 units Regular insulin in TPN  Current Nutrition: NPO. Clear liquids started 1/11  IVF: None  Central access: PICC placed 1/3 TPN start date: 1/3  ASSESSMENT                                                                                                          HPI: 42 yoM presented to ED on 12/27 with abdominal pain.  PMH includes HTN, HLD, PVD - s/p fem bypass, CAD s/p NSTEMI 2014, cardiomyopathy, DM, diabetic peripheral neuropathy, seizures, and chronic pain on chronic opioids.  Surgical history significant for open AAA repair.  CT shows partial SBO,  likely adhesions.  Patient refused surgery, until 1/6 s/p repair of perforated visous.  Plan for conservative management with TPN, bowel rest.   Significant events:  1/1: TPN per pharmacy ordered - unable to start without central access.  1/3: PICC line placed; TPN started 1/5: Acute encephalopathy with fevers (102F) overnight- sepsis protocol initiated.  Broad-spectrum abx started.  1/6: Consented to laparotomy w/ findings of perforated viscus and peritonitis. Txfer to ICU   1/10: NGT removed 1/11: started clear liquid diet 1/13 NPO again for CT guided drain placement for perihepatic fluid collection suspicious for abscess  Today 12/08/2015:   Glucose: 1x CBG above goal of <150 mg/dL.  Insulin added to TPN. Hx DM on glipizide PTA.   Electrolytes - Mg low yesterday and was replaced, CorrCa 10.0.  Renal - AKI; SCr trended down today.  UOP good.  I/O: -999 mL with 1x unmeasured UOP  LFTs - AST/ALT and Tbili ok yesterday  TGs - 124 (1/2), 164 (1/9)  Prealbumin - 3.7 (1/2), < 2 (1/9)  NUTRITIONAL GOALS                                                                                             RD recs: 1600-1800 Kcal/day, 65-80 g protein/day, > 2 L fluid/day  Clinimix E 5/15 at a goal rate of 65 ml/hr + 20% fat emulsion at 10 ml/hr to provide: 78 g/day protein, 1588 Kcal/day.  PLAN  Continue Clinimix E 5/15 at goal rate of 65 ml/h  Insulin added to TPN so that patient receives 20 units of regular insulin over 24 hour infusion.    Continue 20% fat emulsion at 10 ml/hr  Standard MVI, MTE in TPN bag.  Continue q6h CBGs with moderate correction insulin.  TPN lab panels on Mondays & Thursdays.  BMET in AM.  Hershal Coria, PharmD, BCPS Pager: [phone] 12/08/2015 10:34 AM

## 2015-12-08 NOTE — Progress Notes (Signed)
ANTICOAGULATION CONSULT NOTE - F/u Consult  Pharmacy Consult for heparin Indication: DVT  Allergies  Allergen Reactions  . Zetia [Ezetimibe] Anaphylaxis and Swelling    Tongue and throat   . Atorvastatin Other (See Comments)    Malaise & muscle weakness  . Penicillins Other (See Comments)    Unknown.  Tolerates cefepime  . Pravastatin Sodium     Pravastatin 40 mg qday and 40 mg q M/W/F caused muscle aches  . Lisinopril Rash  . Promethazine Rash    Patient Measurements: Height: 6\' 2"  (188 cm) Weight: 157 lb 10.1 oz (71.5 kg) IBW/kg (Calculated) : 82.2 Heparin Dosing Weight: 72kg  Vital Signs: Temp: 98.2 F (36.8 C) (01/13 0809) Temp Source: Oral (01/13 0809) BP: 147/88 mmHg (01/13 1330) Pulse Rate: 75 (01/13 1330)  Labs:  Recent Labs  12/06/15 0610 12/06/15 0925 12/06/15 1740  12/07/15 0445 12/07/15 0845 12/07/15 2000 12/08/15 0340  HGB 11.1*  --  11.0*  --  10.0*  --   --  10.8*  HCT 32.4*  --  31.8*  --  29.5*  --   --  31.7*  PLT 110*  --  118*  --  120*  --   --  129*  APTT  --  45*  --   --   --   --   --   --   LABPROT  --  21.6*  --   --   --   --   --  21.0*  INR  --  1.89*  --   --   --   --   --  1.82*  HEPARINUNFRC  --   --   --   < >  --  0.21* <0.10* 0.41  CREATININE 1.92*  --   --   --  1.89*  --   --  1.68*  < > = values in this interval not displayed.  Estimated Creatinine Clearance: 45.5 mL/min (by C-G formula based on Cr of 1.68).  Assessment: 49 YOM known to pharmacy for TPN management and antibiotic dosing.  He is s/p ex lap on 1/6 and found to have perforated viscous and peritonitis.  RUE doppler 1/11 reveals DVT of RUE along PICC site.  PICC removed 1/11 and Left IJ placed.   Significant events: 1/11 baseline INR = 1.89, aPTT = 45sec 1/13 heparin held in AM for CT guided drain abscess drainage x 4 areas of fluid collection.  Heparin infusion resumed at previous rate (1800 units/hr) this afternoon at 1400 per IR instruction.  Today,  12/08/2015   CBC: Hgb/platelets low but stable with minimal (<10 mL) blood loss reported during IR drain placements  AKI - SCr improving, CrCl > 52ml/min  Goal of Therapy:  Heparin level 0.3-0.7 units/ml Monitor platelets by anticoagulation protocol: Yes   Plan:   Heparin infusion at 1800 units/hr (previously therapeutic rate).  Check heparin level 6 hours from resumption of infusion.  Daily CBC and heparin level  Hershal Coria, PharmD, BCPS Pager: [phone] 12/08/2015 2:15 PM

## 2015-12-08 NOTE — Procedures (Signed)
Interventional Radiology Procedure Note  Procedure:  CT guided abscess drainage x 4  Complications:  None  Estimated Blood Loss: < 10 mL  Right perihepatic collection yielded bloody fluid:  10 Fr drain placed.  Sample sent for culture.  RLQ collection yielded serous fluid:  10 Fr drain placed.  Sample sent for culture.  LUQ collection yielded serous/slightly turbid fluid:  10 Fr drain placed.  Sample sent for culture.  Left pelvic collection yielded serous fluid.  60 mL withdrawn through [facility] catheter.  No drain placed in left pelvic collection.  All drains placed to suction bulb drainage.  Venetia Night. Kathlene Cote, M.D Pager:  [phone]

## 2015-12-08 NOTE — Progress Notes (Signed)
Patient ID: [name] [name], male   DOB: October 11, 1951, 65 y.o.   MRN: [id number]     [facility]      [address] [place], [place] [address]    Phone: [phone] FAX: [phone]     Subjective: No n/v.  Sore. WBC continues to increase, 17.6k.  sCr improving.   Objective:  Vital signs:  Filed Vitals:   12/08/15 0400 12/08/15 0500 12/08/15 0600 12/08/15 0700  BP: 154/93 152/91 154/103 145/82  Pulse: 84 85 84 75  Temp: 99.3 F (37.4 C)     TempSrc: Oral     Resp: 20 14 8 13   Height:      Weight: 71.5 kg (157 lb 10.1 oz)     SpO2: 97% 95% 96% 95%    Last BM Date: 12/06/15  Intake/Output   Yesterday:  01/12 0701 - 01/13 0700 In: 2765.6 [P.O.:120; I.V.:694.4; IV Piggyback:150; TPN:1801.3] Out: 3775 [Urine:3775] This shift: I/O last 3 completed shifts: In: 4208.6 [P.O.:360; I.V.:847.4; IV Piggyback:300] Out: 4775 [Urine:4775]    Physical Exam: General: Pt awake/alert to self.   Abdomen: +BS, abdomen is soft, nondistended. Midline wound c/d/i.    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[place])   Ischemic cardiomyopathy   Type II diabetes mellitus ([place])   Diabetic neuropathy ([place])   Noncompliance   Underweight   Deep vein thrombosis (DVT) of right upper extremity ([place])   Acute respiratory failure with hypoxemia ([place])   DVT of axillary vein, acute right    Results:   Labs: Results for orders placed or performed during the hospital encounter of 11/21/15 (from the past 48 hour(s))  Glucose,  capillary     Status: Abnormal   Collection Time: 12/06/15  8:00 AM  Result Value Ref Range   Glucose-Capillary 144 (H) 65 - 99 mg/dL   Comment 1 Notify RN    Comment 2 Document in Chart   Protime-INR     Status: Abnormal   Collection Time: 12/06/15  9:25 AM  Result Value Ref Range   Prothrombin Time 21.6 (H) 11.6 - 15.2 seconds   INR 1.89 (H) 0.00 - 1.49  APTT     Status: Abnormal   Collection Time: 12/06/15  9:25 AM  Result Value Ref Range   aPTT 45 (H) 24 - 37 seconds    Comment:        IF BASELINE aPTT IS ELEVATED, SUGGEST PATIENT RISK ASSESSMENT BE USED TO DETERMINE APPROPRIATE ANTICOAGULANT THERAPY.   Glucose, capillary     Status: Abnormal   Collection Time: 12/06/15 12:18 PM  Result Value Ref Range   Glucose-Capillary 136 (H) 65 - 99 mg/dL   Comment 1 Notify RN    Comment 2 Document in Chart   Glucose, capillary     Status: Abnormal   Collection Time: 12/06/15  4:56 PM  Result Value Ref Range   Glucose-Capillary 156 (H) 65 - 99 mg/dL   Comment 1 Notify RN    Comment 2 Document in Chart   CBC  Status: Abnormal   Collection Time: 12/06/15  5:40 PM  Result Value Ref Range   WBC 14.6 (H) 4.0 - 10.5 K/uL   RBC 3.76 (L) 4.22 - 5.81 MIL/uL   Hemoglobin 11.0 (L) 13.0 - 17.0 g/dL   HCT 31.8 (L) 39.0 - 52.0 %   MCV 84.6 78.0 - 100.0 fL   MCH 29.3 26.0 - 34.0 pg   MCHC 34.6 30.0 - 36.0 g/dL   RDW 15.1 11.5 - 15.5 %   Platelets 118 (L) 150 - 400 K/uL    Comment: PLATELET COUNT CONFIRMED BY SMEAR  Glucose, capillary     Status: Abnormal   Collection Time: 12/06/15  7:59 PM  Result Value Ref Range   Glucose-Capillary 135 (H) 65 - 99 mg/dL   Comment 1 Notify RN    Comment 2 Document in Chart   Glucose, capillary     Status: Abnormal   Collection Time: 12/06/15 11:22 PM  Result Value Ref Range   Glucose-Capillary 128 (H) 65 - 99 mg/dL  Heparin level (unfractionated)     Status: Abnormal   Collection Time: 12/06/15 11:28 PM  Result Value Ref Range   Heparin  Unfractionated <0.10 (L) 0.30 - 0.70 IU/mL    Comment:        IF HEPARIN RESULTS ARE BELOW EXPECTED VALUES, AND PATIENT DOSAGE HAS BEEN CONFIRMED, SUGGEST FOLLOW UP TESTING OF ANTITHROMBIN III LEVELS.   Glucose, capillary     Status: Abnormal   Collection Time: 12/07/15  3:27 AM  Result Value Ref Range   Glucose-Capillary 120 (H) 65 - 99 mg/dL   Comment 1 Notify RN    Comment 2 Document in Chart   Comprehensive metabolic panel     Status: Abnormal   Collection Time: 12/07/15  4:45 AM  Result Value Ref Range   Sodium 141 135 - 145 mmol/L   Potassium 4.0 3.5 - 5.1 mmol/L   Chloride 110 101 - 111 mmol/L   CO2 24 22 - 32 mmol/L   Glucose, Bld 140 (H) 65 - 99 mg/dL   BUN 51 (H) 6 - 20 mg/dL   Creatinine, Ser 1.89 (H) 0.61 - 1.24 mg/dL   Calcium 8.4 (L) 8.9 - 10.3 mg/dL   Total Protein 5.3 (L) 6.5 - 8.1 g/dL   Albumin 1.8 (L) 3.5 - 5.0 g/dL   AST 27 15 - 41 U/L   ALT 14 (L) 17 - 63 U/L   Alkaline Phosphatase 72 38 - 126 U/L   Total Bilirubin 0.9 0.3 - 1.2 mg/dL   GFR calc non Af Amer 36 (L) >60 mL/min   GFR calc Af Amer 42 (L) >60 mL/min    Comment: (NOTE) The eGFR has been calculated using the CKD EPI equation. This calculation has not been validated in all clinical situations. eGFR's persistently <60 mL/min signify possible Chronic Kidney Disease.    Anion gap 7 5 - 15  Magnesium     Status: Abnormal   Collection Time: 12/07/15  4:45 AM  Result Value Ref Range   Magnesium 1.6 (L) 1.7 - 2.4 mg/dL  Phosphorus     Status: None   Collection Time: 12/07/15  4:45 AM  Result Value Ref Range   Phosphorus 4.1 2.5 - 4.6 mg/dL  CBC     Status: Abnormal   Collection Time: 12/07/15  4:45 AM  Result Value Ref Range   WBC 15.9 (H) 4.0 - 10.5 K/uL   RBC 3.47 (L) 4.22 - 5.81 MIL/uL  Hemoglobin 10.0 (L) 13.0 - 17.0 g/dL   HCT 29.5 (L) 39.0 - 52.0 %   MCV 85.0 78.0 - 100.0 fL   MCH 28.8 26.0 - 34.0 pg   MCHC 33.9 30.0 - 36.0 g/dL   RDW 15.1 11.5 - 15.5 %   Platelets 120 (L) 150 -  400 K/uL  Glucose, capillary     Status: Abnormal   Collection Time: 12/07/15  7:37 AM  Result Value Ref Range   Glucose-Capillary 146 (H) 65 - 99 mg/dL  Heparin level (unfractionated)     Status: Abnormal   Collection Time: 12/07/15  8:45 AM  Result Value Ref Range   Heparin Unfractionated 0.21 (L) 0.30 - 0.70 IU/mL    Comment:        IF HEPARIN RESULTS ARE BELOW EXPECTED VALUES, AND PATIENT DOSAGE HAS BEEN CONFIRMED, SUGGEST FOLLOW UP TESTING OF ANTITHROMBIN III LEVELS.   Glucose, capillary     Status: Abnormal   Collection Time: 12/07/15  8:51 AM  Result Value Ref Range   Glucose-Capillary 154 (H) 65 - 99 mg/dL  Glucose, capillary     Status: Abnormal   Collection Time: 12/07/15 12:04 PM  Result Value Ref Range   Glucose-Capillary 150 (H) 65 - 99 mg/dL  Glucose, capillary     Status: Abnormal   Collection Time: 12/07/15  4:28 PM  Result Value Ref Range   Glucose-Capillary 132 (H) 65 - 99 mg/dL  Glucose, capillary     Status: Abnormal   Collection Time: 12/07/15  7:48 PM  Result Value Ref Range   Glucose-Capillary 117 (H) 65 - 99 mg/dL   Comment 1 Notify RN    Comment 2 Document in Chart   Heparin level (unfractionated)     Status: Abnormal   Collection Time: 12/07/15  8:00 PM  Result Value Ref Range   Heparin Unfractionated <0.10 (L) 0.30 - 0.70 IU/mL    Comment:        IF HEPARIN RESULTS ARE BELOW EXPECTED VALUES, AND PATIENT DOSAGE HAS BEEN CONFIRMED, SUGGEST FOLLOW UP TESTING OF ANTITHROMBIN III LEVELS.   CBC     Status: Abnormal   Collection Time: 12/08/15  3:40 AM  Result Value Ref Range   WBC 17.6 (H) 4.0 - 10.5 K/uL   RBC 3.71 (L) 4.22 - 5.81 MIL/uL   Hemoglobin 10.8 (L) 13.0 - 17.0 g/dL   HCT 31.7 (L) 39.0 - 52.0 %   MCV 85.4 78.0 - 100.0 fL   MCH 29.1 26.0 - 34.0 pg   MCHC 34.1 30.0 - 36.0 g/dL   RDW 14.9 11.5 - 15.5 %   Platelets 129 (L) 150 - 400 K/uL  Heparin level (unfractionated)     Status: None   Collection Time: 12/08/15  3:40 AM  Result  Value Ref Range   Heparin Unfractionated 0.41 0.30 - 0.70 IU/mL    Comment:        IF HEPARIN RESULTS ARE BELOW EXPECTED VALUES, AND PATIENT DOSAGE HAS BEEN CONFIRMED, SUGGEST FOLLOW UP TESTING OF ANTITHROMBIN III LEVELS.   Protime-INR     Status: Abnormal   Collection Time: 12/08/15  3:40 AM  Result Value Ref Range   Prothrombin Time 21.0 (H) 11.6 - 15.2 seconds   INR 1.82 (H) 0.00 - 0.93  Basic metabolic panel     Status: Abnormal   Collection Time: 12/08/15  3:40 AM  Result Value Ref Range   Sodium 137 135 - 145 mmol/L   Potassium 3.9 3.5 - 5.1  mmol/L   Chloride 104 101 - 111 mmol/L   CO2 26 22 - 32 mmol/L   Glucose, Bld 151 (H) 65 - 99 mg/dL   BUN 52 (H) 6 - 20 mg/dL   Creatinine, Ser 1.68 (H) 0.61 - 1.24 mg/dL   Calcium 8.3 (L) 8.9 - 10.3 mg/dL   GFR calc non Af Amer 42 (L) >60 mL/min   GFR calc Af Amer 48 (L) >60 mL/min    Comment: (NOTE) The eGFR has been calculated using the CKD EPI equation. This calculation has not been validated in all clinical situations. eGFR's persistently <60 mL/min signify possible Chronic Kidney Disease.    Anion gap 7 5 - 15    Imaging / Studies: Ct Abdomen Pelvis Wo Contrast  12/07/2015  CLINICAL DATA:  Follow-up small bowel obstruction EXAM: CT ABDOMEN AND PELVIS WITHOUT CONTRAST TECHNIQUE: Multidetector CT imaging of the abdomen and pelvis was performed following the standard protocol without IV contrast. COMPARISON:  11/21/2015 FINDINGS: Lung bases shows hazy mild interstitial prominence probable mild interstitial edema. There is small bilateral pleural effusion right greater than left. There is significant atelectasis or infiltrate in right lower lobe posteriorly. Small atelectasis noted left lower lobe posteriorly. Sagittal images of the spine shows degenerative changes thoracic and lumbar spine. Study is limited without IV contrast. There is moderate perihepatic and perisplenic ascites. Please note there is some loculation of the  perihepatic ascites with mild mass effect in the liver dome and anterior liver margin, please see axial image 38 and 15. Peritoneal inflammation or infection cannot be entirely excluded. Clinical correlation is necessary. Old appearing right lower anterior rib fracture. No intrahepatic biliary ductal dilatation. Unenhanced pancreas, spleen and adrenal glands are unremarkable. Unenhanced kidneys shows no nephrolithiasis. No hydronephrosis or hydroureter. A large cyst in lower pole of the right kidney again noted measures 6.6 cm. Six in midpole of the left kidney again noted measures 3 cm. Mild distended small bowel loops are noted in mid abdomen containing oral contrast material and some air-fluid levels. Findings most likely due to significant ileus or less likely improving partial small bowel obstruction. No transition point in caliber of small bowel. There is small amount of contrast material noted in distal colon. Postsurgical changes are noted anterior abdominal wall. Partially open wound noted anterior abdominal wall axial image 67. Clinical correlation is necessary. There is a small right inguinal scrotal canal hernia containing fluid. Small to moderate pelvic ascites is noted. The urinary bladder is unremarkable. Moderate gaseous distension in the mid sigmoid colon. There is significant anasarca infiltration of subcutaneous fat in abdominal and pelvic wall. 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 Limited assessment without IV contrast. Significant disc space flattening with endplate sclerotic changes and vacuum disc phenomenon at L5-S1 level. Moderate ascites noted bilateral pericolic gutters. Tiny amount of air within urinary bladder probable post instrumentation. IMPRESSION: 1. There is small bilateral pleural effusion right greater than left. Significant atelectasis or infiltrate in right lower lobe posteriorly. There is superimposed mild interstitial prominence bilateral lower lobes probable  mild interstitial edema. 2. There is moderate perihepatic and perisplenic ascites. Moderate ascites noted bilateral paracolic gutters. Please note there is some loculation of perihepatic ascites with mild mass effect on the liver contour please see axial images 32 and 15. Peritoneal inflammation or infection cannot be excluded. 3. No hydronephrosis or hydroureter.  Stable bilateral renal cysts. 4. Postsurgical changes are noted anterior abdominal wall. Incomplete healed/open subcutaneous midline anterior abdominal wound. 5. Mild distended  small bowel loops containing oral contrast material and some air-fluid levels. There is no transition point in caliber of small bowel. Findings most likely due to significant ileus or residual partial small bowel obstruction. There is some oral contrast material noted within distal colon. 6. Moderate pelvic ascites. Moderate gas noted within mid sigmoid colon probable mild ileus. 7. Significant anasarca infiltration of subcutaneous fat abdominal and pelvic wall. 8. 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 Electronically Signed   By: Lahoma Crocker M.D.   On: 12/07/2015 15:18   Dg Chest Port 1 View  12/06/2015  CLINICAL DATA:  Encounter for central line placement. EXAM: PORTABLE CHEST 1 VIEW COMPARISON:  December 05, 2015. FINDINGS: The heart size and mediastinal contours are within normal limits. No pneumothorax is noted. Stable mild interstitial densities are noted throughout both lungs consistent with pulmonary edema. Stable right basilar opacity is noted concerning for atelectasis with associated pleural effusion. Nasogastric tube has been removed. Right-sided PICC line is again noted with tip at cavoatrial junction. Interval placement of left internal jugular catheter with distal tip in SVC. The visualized skeletal structures are unremarkable. IMPRESSION: Stable bilateral pulmonary edema, with right basilar subsegmental atelectasis and associated pleural  effusion. Interval placement of left internal jugular catheter with distal tip in SVC. No pneumothorax is noted. Electronically Signed   By: Marijo Conception, M.D.   On: 12/06/2015 16:10    Medications / Allergies:  Scheduled Meds: . antiseptic oral rinse  7 mL Mouth Rinse q12n4p  . bacitracin   Topical Daily  . ceFEPime (MAXIPIME) IV  1 g Intravenous Q12H  . chlorhexidine  15 mL Mouth Rinse BID  . collagenase  1 application Topical Daily  . insulin aspart  0-15 Units Subcutaneous 6 times per day  . lip balm  1 application Topical BID  . pantoprazole (PROTONIX) IV  40 mg Intravenous Q12H  . sodium chloride  3 mL Intravenous Q12H  . thiamine  100 mg Oral Daily   Or  . thiamine  100 mg Intravenous Daily   Continuous Infusions: . Marland KitchenTPN (CLINIMIX-E) Adult 65 mL/hr at 12/07/15 1732   And  . fat emulsion 240 mL (12/07/15 1732)  . heparin 1,800 Units/hr (12/08/15 0424)   PRN Meds:.acetaminophen, albuterol, alum & mag hydroxide-simeth, bisacodyl, diphenhydrAMINE, hydrALAZINE, HYDROmorphone (DILAUDID) injection, magic mouthwash, menthol-cetylpyridinium, oxyCODONE, phenol, sodium chloride, triamcinolone cream  Antibiotics: Anti-infectives    Start     Dose/Rate Route Frequency Ordered Stop   12/03/15 1800  vancomycin (VANCOCIN) IVPB 750 mg/150 ml premix  Status:  Discontinued     750 mg 150 mL/hr over 60 Minutes Intravenous Every 24 hours 12/03/15 1715 12/04/15 0911   12/02/15 1800  ceFEPIme (MAXIPIME) 1 g in dextrose 5 % 50 mL IVPB     1 g 100 mL/hr over 30 Minutes Intravenous Every 12 hours 12/02/15 1014 12/09/15 1759   12/02/15 0600  vancomycin (VANCOCIN) IVPB 1000 mg/200 mL premix  Status:  Discontinued     1,000 mg 200 mL/hr over 60 Minutes Intravenous 3 times per day 12/02/15 0547 12/02/15 0954   12/01/15 2200  ceFEPIme (MAXIPIME) 1 g in dextrose 5 % 50 mL IVPB  Status:  Discontinued     1 g 100 mL/hr over 30 Minutes Intravenous 3 times per day 12/01/15 1455 12/02/15 1014    11/30/15 1400  ceFEPIme (MAXIPIME) 1 g in dextrose 5 % 50 mL IVPB  Status:  Discontinued     1 g 100 mL/hr over  30 Minutes Intravenous 3 times per day 11/30/15 0913 12/01/15 1455   11/30/15 1000  metroNIDAZOLE (FLAGYL) IVPB 500 mg     500 mg 100 mL/hr over 60 Minutes Intravenous Every 8 hours 11/30/15 0858 12/07/15 0226   11/30/15 0645  vancomycin (VANCOCIN) IVPB 750 mg/150 ml premix  Status:  Discontinued     750 mg 150 mL/hr over 60 Minutes Intravenous 3 times per day 11/30/15 0630 12/02/15 0547   11/30/15 0645  imipenem-cilastatin (PRIMAXIN) 500 mg in sodium chloride 0.9 % 100 mL IVPB  Status:  Discontinued     500 mg 200 mL/hr over 30 Minutes Intravenous 3 times per day 11/30/15 0630 11/30/15 0859        Assessment/Plan POD#7 exlap, and repair of SB perforation in setting of SBO--Dr. Hassell Done 12/01/15 -CT shows moderate perihepatic and perisplenic ascites, IR consulted for drain placement to perihepatic collection.  Add flagyl to cefepime.  Check cultures.  -pulmonary toilet(refusing), mobilize  -BID wet to dry dressing changes to open wound/retention sutures Post op ileus-resolved ID-cefepime/flagyl D#9.  See discussion above RUE DVT-heparin gtt, discussed with PA Allred who will address when its stopped PCM-TPN until PO is adequate.  FEN-NPO for now, fulls after procedure Dispo-may transfer to floor from a surgical standpoint     Erby Pian, [facility] Pager [phone])   12/08/2015 7:38 AM

## 2015-12-08 NOTE — Consult Note (Signed)
Chief Complaint: Patient was seen in consultation today for CT guided aspiration/possible drainage of abdominal fluid collection(s) Chief Complaint  Patient presents with  . Abdominal Pain    Referring Physician(s): [facility]  History of Present Illness: [name] [name] is a 65 y.o. male recently admitted with abdominal pain/SBO and subsequent development of perforated SB, s/p expl lap and closure/washout procedure on 12/01/15. He also has hx of acute RUE DVT and currently on IV heparin. His WBC continues to rise and latest CT A/P demonstrates moderate loculated perihepatic/perisplenic/pelvic ascites. Request now received from CCS for CT guided drainage of the perihepatic fluid collection. Additional hx as below.   Past Medical History  Diagnosis Date  . Hyperlipidemia   . Hypertension   . Leg pain   . Peripheral vascular disease ([place])     s/p prior Ao-Bifem BPG  . 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 Coronary artery disease     a. NSTEMI 7/14=> LHC 06/07/13: Proximal LAD 30%, ostial D1 30%, AV circumflex 80%, then occluded, ostial OM2 occluded, left to left collaterals, mid RCA 95%, inferior HK, EF 60%. PCI: Promus DES to the mid RCA.  No intervention was recommended for the CTO circumflex.  . Ischemic cardiomyopathy     a. Echocardiogram 06/06/13: EF 35-40%, inferior AK, mild MR.  Marland Kitchen Shortness of breath     "can come on at anytime lately; before that it was just w/exertion" (11/10/2013)  . Type II diabetes mellitus ([place])   . [place] mal seizure [facility])     "last one was ~ 3 yr ago; controlled w/daily RX" (11/10/2013)  . Arthritis     "neck, back, legs" (11/10/2013)  . Kidney stones     "when I was younger; passed w/o treatment" (11/10/2013)  . Myocardial infarction ([place])   . Diabetic neuropathy ([place])   . H. pylori infection   . Wears glasses   . NSTEMI (non-ST elevated myocardial infarction) ([place]) 06/05/2013  . Shoulder  contusion 11/07/2013    Past Surgical History  Procedure Laterality Date  . Iliac artery stent  05/06/2011  . Tonsillectomy    . Aorta - bilateral femoral artery bypass graft  11/25/2011    Procedure: AORTA BIFEMORAL BYPASS GRAFT;  Surgeon: Rosetta Posner, MD;  Location: [place];  Service: Vascular;  Laterality: N/A;  . Pr vein bypass graft,aorto-fem-pop  11/24/2012  . Inguinal hernia repair Right   . Abdominal aortagram N/A 11/11/2011    Procedure: ABDOMINAL Maxcine Ham;  Surgeon: Angelia Mould, MD;  Location: [facility] CATH LAB;  Service: Cardiovascular;  Laterality: N/A;  . Lower extremity angiogram Bilateral 11/11/2011    Procedure: LOWER EXTREMITY ANGIOGRAM;  Surgeon: Angelia Mould, MD;  Location: [facility] CATH LAB;  Service: Cardiovascular;  Laterality: Bilateral;  . Left heart catheterization with coronary angiogram N/A 06/07/2013    Procedure: LEFT HEART CATHETERIZATION WITH CORONARY ANGIOGRAM;  Surgeon: Sherren Mocha, MD;  Location: [facility] CATH LAB;  Service: Cardiovascular;  Laterality: N/A;  . Percutaneous coronary stent intervention (pci-s) Right 06/07/2013    Procedure: PERCUTANEOUS CORONARY STENT INTERVENTION (PCI-S);  Surgeon: Sherren Mocha, MD;  Location: [facility] CATH LAB;  Service: Cardiovascular;  Laterality: Right;  . Left heart catheterization with coronary angiogram N/A 12/10/2013    Procedure: LEFT HEART CATHETERIZATION WITH CORONARY ANGIOGRAM;  Surgeon: Blane Ohara, MD;  Location: [facility] CATH LAB;  Service: Cardiovascular;  Laterality: N/A;  . Colonoscopy    .  Peripheral vascular catheterization N/A 07/26/2015    Procedure: Abdominal Aortogram;  Surgeon: Serafina Mitchell, MD;  Location: [place] CV LAB;  Service: Cardiovascular;  Laterality: N/A;  . Peripheral vascular catheterization Bilateral 07/26/2015    Procedure: Lower Extremity Angiography;  Surgeon: Serafina Mitchell, MD;  Location: [place] CV LAB;  Service: Cardiovascular;  Laterality: Bilateral;  . Femoral-popliteal  bypass graft Left 07/28/2015    Procedure: LEFT FEMORAL-POPLITEAL ARTERY BYPASS GRAFT;  Surgeon: Rosetta Posner, MD;  Location: [place];  Service: Vascular;  Laterality: Left;  . Laparotomy N/A 12/01/2015    Procedure: EXPLORATORY LAPAROTOMY WITH RADICAL PERITONEAL DEBRIDEMENT, CLOSURE OF SMALL BOWEL PERFORATION AND LYSIS OF ADHESIONS.;  Surgeon: Johnathan Hausen, MD;  Location: [facility];  Service: General;  Laterality: N/A;    Allergies: Zetia; Atorvastatin; Penicillins; Pravastatin sodium; Lisinopril; and Promethazine  Medications: Prior to Admission medications   Medication Sig Start Date End Date Taking? Authorizing Provider  albuterol (PROVENTIL HFA;VENTOLIN HFA) 108 (90 BASE) MCG/ACT inhaler Inhale 2 puffs into the lungs every 6 (six) hours as needed for wheezing or shortness of breath. 11/11/13  Yes Ripudeep Krystal Eaton, MD  aspirin EC 81 MG tablet Take 1 tablet (81 mg total) by mouth daily. 11/06/15  Yes Sherren Mocha, MD  collagenase (SANTYL) ointment Apply thin layer to wound bed of left foot daily Patient taking differently: Apply 1 application topically daily. Apply thin layer to wound bed of left foot daily 10/26/15  Yes Rosetta Posner, MD  gabapentin (NEURONTIN) 300 MG capsule Take 300 mg by mouth 3 (three) times daily. 09/30/15  Yes Historical Provider, MD  glipiZIDE (GLUCOTROL) 10 MG tablet Take 10 mg by mouth 2 (two) times daily before a meal.   Yes Historical Provider, MD  ketoconazole (NIZORAL) 2 % cream Apply 1 application topically daily as needed for irritation.   Yes Historical Provider, MD  Multiple Vitamins-Minerals (MULTIVITAMIN WITH MINERALS) tablet Take 4 tablets by mouth 3 (three) times daily.   Yes Historical Provider, MD  nitroGLYCERIN (NITROSTAT) 0.4 MG SL tablet Place 0.4 mg under the tongue every 5 (five) minutes as needed for chest pain.   Yes Historical Provider, MD  oxyCODONE (ROXICODONE) 15 MG immediate release tablet Take 3 tablets (45 mg total) by mouth every 6 (six) hours as  needed (severe pain). 07/30/15  Yes Kimberly A Trinh, PA-C  PATADAY 0.2 % SOLN Place 1 drop into both eyes daily.  08/11/14  Yes Historical Provider, MD  polyethylene glycol (MIRALAX / GLYCOLAX) packet Take 17 g by mouth daily as needed for mild constipation or moderate constipation.    Yes Historical Provider, MD  triamcinolone cream (KENALOG) 0.1 % Apply 1 application topically 2 (two) times daily as needed (for eczema).   Yes Historical Provider, MD  zolpidem (AMBIEN) 10 MG tablet Take 10 mg by mouth at bedtime as needed for sleep.   Yes Historical Provider, MD  silver sulfADIAZINE (SILVADENE) 1 % cream Apply 1 application topically daily. Patient not taking: Reported on 11/14/2015 07/25/15   Rosetta Posner, MD     Family History  Problem Relation Age of Onset  . Hypertension Mother   . Heart attack Mother   . Diabetes Mother   . Aneurysm Father   . Heart attack Sister   . Heart disease Brother     heart transplant    Social History   Social History  . Marital Status: Single    Spouse Name: N/A  . Number of Children: N/A  .  Years of Education: N/A   Social History Main Topics  . Smoking status: Current Every Day Smoker -- 0.50 packs/day for 46 years    Types: Cigarettes  . Smokeless tobacco: Never Used  . Alcohol Use: 0.0 oz/week    0 Standard drinks or equivalent per week     Comment: [id number] "last drink was 3-4 yr ago; never had problem w/it"  . Drug Use: 1.00 per week    Special: Marijuana, Cocaine     Comment: stopped May 07, 2012  . Sexual Activity: Not Currently   Other Topics Concern  . None   Social History Narrative      Review of Systems denies N/V or abnormal bleeding; c/o abd distension/soreness, some dyspnea  Vital Signs: BP 114/50 mmHg  Pulse 74  Temp(Src) 98.2 F (36.8 C) (Oral)  Resp 16  Ht 6\' 2"  (1.88 m)  Wt 157 lb 10.1 oz (71.5 kg)  BMI 20.23 kg/m2  SpO2 98%  Physical Exam awake but drowsy/weak ; chest with sl dim BS/few crackles  bases; heart- RRR; abd- sl dist,few BS, midline wound with clean dressing; LLE ulcer  Mallampati Score:     Imaging: Ct Abdomen Pelvis Wo Contrast  12/07/2015  CLINICAL DATA:  Follow-up small bowel obstruction EXAM: CT ABDOMEN AND PELVIS WITHOUT CONTRAST TECHNIQUE: Multidetector CT imaging of the abdomen and pelvis was performed following the standard protocol without IV contrast. COMPARISON:  11/21/2015 FINDINGS: Lung bases shows hazy mild interstitial prominence probable mild interstitial edema. There is small bilateral pleural effusion right greater than left. There is significant atelectasis or infiltrate in right lower lobe posteriorly. Small atelectasis noted left lower lobe posteriorly. Sagittal images of the spine shows degenerative changes thoracic and lumbar spine. Study is limited without IV contrast. There is moderate perihepatic and perisplenic ascites. Please note there is some loculation of the perihepatic ascites with mild mass effect in the liver dome and anterior liver margin, please see axial image 38 and 15. Peritoneal inflammation or infection cannot be entirely excluded. Clinical correlation is necessary. Old appearing right lower anterior rib fracture. No intrahepatic biliary ductal dilatation. Unenhanced pancreas, spleen and adrenal glands are unremarkable. Unenhanced kidneys shows no nephrolithiasis. No hydronephrosis or hydroureter. A large cyst in lower pole of the right kidney again noted measures 6.6 cm. Six in midpole of the left kidney again noted measures 3 cm. Mild distended small bowel loops are noted in mid abdomen containing oral contrast material and some air-fluid levels. Findings most likely due to significant ileus or less likely improving partial small bowel obstruction. No transition point in caliber of small bowel. There is small amount of contrast material noted in distal colon. Postsurgical changes are noted anterior abdominal wall. Partially open wound noted  anterior abdominal wall axial image 67. Clinical correlation is necessary. There is a small right inguinal scrotal canal hernia containing fluid. Small to moderate pelvic ascites is noted. The urinary bladder is unremarkable. Moderate gaseous distension in the mid sigmoid colon. There is significant anasarca infiltration of subcutaneous fat in abdominal and pelvic wall. 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 Limited assessment without IV contrast. Significant disc space flattening with endplate sclerotic changes and vacuum disc phenomenon at L5-S1 level. Moderate ascites noted bilateral pericolic gutters. Tiny amount of air within urinary bladder probable post instrumentation. IMPRESSION: 1. There is small bilateral pleural effusion right greater than left. Significant atelectasis or infiltrate in right lower lobe posteriorly. There is superimposed mild interstitial prominence bilateral lower lobes probable  mild interstitial edema. 2. There is moderate perihepatic and perisplenic ascites. Moderate ascites noted bilateral paracolic gutters. Please note there is some loculation of perihepatic ascites with mild mass effect on the liver contour please see axial images 32 and 15. Peritoneal inflammation or infection cannot be excluded. 3. No hydronephrosis or hydroureter.  Stable bilateral renal cysts. 4. Postsurgical changes are noted anterior abdominal wall. Incomplete healed/open subcutaneous midline anterior abdominal wound. 5. Mild distended small bowel loops containing oral contrast material and some air-fluid levels. There is no transition point in caliber of small bowel. Findings most likely due to significant ileus or residual partial small bowel obstruction. There is some oral contrast material noted within distal colon. 6. Moderate pelvic ascites. Moderate gas noted within mid sigmoid colon probable mild ileus. 7. Significant anasarca infiltration of subcutaneous fat abdominal and pelvic wall. 8.  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 Electronically Signed   By: Lahoma Crocker M.D.   On: 12/07/2015 15:18   Dg Abd 1 View  11/22/2015  CLINICAL DATA:  Follow-up SBO EXAM: ABDOMEN - 1 VIEW COMPARISON:  11/21/2015 FINDINGS: Enteric tube in the proximal stomach. Moderate dilatation of multiple small bowel loops, compatible with persistent small bowel obstruction. Vascular stents. Residual extra contrast in the bladder. IMPRESSION: Enteric tube in the proximal stomach. Stable small bowel dilatation, consistent with persistent small bowel obstruction. Electronically Signed   By: Julian Hy M.D.   On: 11/22/2015 09:13   Dg Abd 1 View  11/22/2015  CLINICAL DATA:  Status post nasogastric tube placement. Initial encounter. EXAM: ABDOMEN - 1 VIEW COMPARISON:  CT of the abdomen and pelvis performed earlier today at 6:45 p.m. FINDINGS: The patient's enteric tube is seen ending overlying the body of the stomach, with the side port about the gastroesophageal junction. There is dilatation of small bowel loops up to 4.9 cm in maximal diameter, compatible with small bowel obstruction as previously noted. No definite free air is seen within the abdomen, though evaluation is limited on a single supine view. No acute osseous abnormalities are identified. A left common iliac stent is noted. The visualized lung apices are grossly clear. IMPRESSION: 1. Enteric tube seen ending overlying the body of the stomach, with the side port about the gastroesophageal junction. 2. Dilatation of small bowel loops up to 4.9 cm in maximal diameter, compatible with small bowel obstruction as previously noted. 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 There is  approximately 360 degrees of twisting at the mesenteric axis. Would correlate clinically. These results were called by telephone at the time of interpretation on 11/22/2015 at 12:04 am to Dr. Marcello Moores, who verbally acknowledged these results. Electronically Signed   By: Garald Balding M.D.   On: 11/22/2015 00:08   Ct Abdomen Pelvis W Contrast  11/21/2015  CLINICAL DATA:  Mid abdominal pain beginning this morning. Distension in right lower quadrant. Withdrawing from oxycodone. Small bowel obstruction. EXAM: CT ABDOMEN AND PELVIS WITH CONTRAST TECHNIQUE: Multidetector CT imaging of the abdomen and pelvis was performed using the standard protocol following bolus administration of intravenous contrast. CONTRAST:  68mL OMNIPAQUE IOHEXOL 300 MG/ML SOLN, 155mL OMNIPAQUE IOHEXOL 300 MG/ML SOLN COMPARISON:  11/21/2015 plain films.  CT of 10/18/2014. FINDINGS: Lower chest: Bibasilar atelectasis. Normal heart size  without pericardial or pleural effusion. Right coronary artery stent. Hepatobiliary: Probable perfusion anomaly in the high right hepatic lobe on image 14/series 2, similar. A too small to characterize lesion in the left hepatic lobe on image 16/series 2. Normal gallbladder, without biliary ductal dilatation. Pancreas: Mild pancreatic atrophy, without duct dilatation or dominant mass. Spleen: Normal in size, without focal abnormality. Adrenals/Urinary Tract: Normal adrenal glands. Bilateral fluid density renal lesions, most consistent with cysts. An inter/lower polar right renal lesion measures 5.3 x 5.8 cm including on image 42/series 2. This has a perceptible peripheral wall, suggesting complexity. Is however, decreased in size since the 2011 exam, favoring a benign etiology. No hydronephrosis. Normal urinary bladder. Stomach/Bowel: Mild to moderate gastric distention. Distal colon is diminutive, likely decompressed. The proximal colon is stool-filled. Appendix is not confidently identified. Terminal ileum is  decompressed on image 56 of series 2. Proximal small bowel loops are mildly dilated and fluid-filled, including at 3.3 cm on image 60/series 2. Transition point is identified within the left hemipelvis, involving distal small bowel. Example image 58/series 2. No pneumatosis or free intraperitoneal air. No bowel wall thickening to suggest complicating ischemia. Vascular/Lymphatic: Status post aortic to femoral bypass. Celiac and superior mesenteric artery both patent. 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 No abdominopelvic adenopathy. Reproductive: Normal prostate.  Small bilateral hydroceles. Other: Small volume abdominal pelvic ascites. Musculoskeletal: Left iliac bone island. Advanced degenerative disc disease at L5-S1. IMPRESSION: 1. Partial small bowel obstruction. Transition point identified within the distal small bowel, likely secondary to adhesions. 2. Although no specific evidence of complicating ischemia is identified, there is new small volume abdominal pelvic ascites, nonspecific. 3. Bilateral hydroceles. Electronically Signed   By: Abigail Miyamoto M.D.   On: 11/21/2015 19:03   Dg Chest Port 1 View  12/06/2015  CLINICAL DATA:  Encounter for central line placement. EXAM: PORTABLE CHEST 1 VIEW COMPARISON:  December 05, 2015. FINDINGS: The heart size and mediastinal contours are within normal limits. No pneumothorax is noted. Stable mild interstitial densities are noted throughout both lungs consistent with pulmonary edema. Stable right basilar opacity is noted concerning for atelectasis with associated pleural effusion. Nasogastric tube has been removed. Right-sided PICC line is again noted with tip at cavoatrial junction. Interval placement of left internal jugular catheter with distal tip in SVC. The visualized skeletal structures are unremarkable. IMPRESSION: Stable bilateral pulmonary edema, with right basilar subsegmental atelectasis and associated pleural effusion.  Interval placement of left internal jugular catheter with distal tip in SVC. No pneumothorax is noted. Electronically Signed   By: Marijo Conception, M.D.   On: 12/06/2015 16:10   Dg Chest Port 1 View  12/05/2015  CLINICAL DATA:  Small bowel obstruction, acute onset of hypoxia ; history of diabetes, coronary artery disease with stent placement, peripheral vascular disease, current smoker. EXAM: PORTABLE CHEST 1 VIEW COMPARISON:  Chest x-ray of December 03, 2015 FINDINGS: Acute worsening of pulmonary interstitial density has occurred bilaterally. The right hemidiaphragm is now obscured. There is partial obscuration of the left hemidiaphragm. There are small bilateral pleural effusions. The heart is not enlarged. The pulmonary vascularity is not clearly engorged. The right-sided PICC line tip projects over the distal third of the SVC. The esophagogastric tube tip projects below the inferior margin of the image. IMPRESSION: Worsening of pulmonary interstitial infiltrates bilaterally. This may reflect pulmonary edema of cardiac or noncardiac cause or could reflect pneumonia. There are small bilateral pleural effusions. Electronically Signed   By: Shanon Brow  [place] M.D.   On: 12/05/2015 08:52   Dg Chest Port 1 View  12/03/2015  CLINICAL DATA:  Acute respiratory failure. History of hypertension, smoking. EXAM: PORTABLE CHEST 1 VIEW COMPARISON:  Chest x-rays dated 12/01/2015, 11/30/2015 and 11/21/2015. FINDINGS: Right-sided PICC line remains well positioned with tip projected over the expected region of the cavoatrial junction. Nasogastric tube passes below the diaphragm. Heart size is upper normal, unchanged. Overall cardiomediastinal silhouette is stable in size and configuration. Endotracheal tube has been removed in the interval. Dense opacity at the left lung base is unchanged, probably atelectasis. Mildly prominent interstitial markings persist bilaterally suggesting mild edema, perhaps mildly increased on the right.  Veiled opacities at each lung base suggests additional small bilateral pleural effusions. IMPRESSION: 1. Perhaps mildly increased interstitial edema on the right. 2. Endotracheal tube has been removed. 3. No other significant interval change. Dense opacity at the left lung base is unchanged, atelectasis versus pneumonia. Mild bilateral interstitial edema. Probable small bilateral pleural effusions. Electronically Signed   By: Franki Cabot M.D.   On: 12/03/2015 07:56   Dg Chest Port 1 View  12/01/2015  CLINICAL DATA:  Endotracheal tube placement ; sepsis, encephalopathy, coronary artery disease, current smoker. EXAM: PORTABLE CHEST 1 VIEW COMPARISON:  Portable chest x-ray of November 30, 2015 FINDINGS: There is been interval intubation of the trachea. The tip of the tube lies approximately 6.4 cm above the carina. There bibasilar infiltrates. The heart is normal in size. The pulmonary vascularity is not engorged. The esophagogastric tube tip projects below the inferior margin of the image. The right-sided PICC line tip projects over the proximal SVC. IMPRESSION: 1. Interval intubation of the trachea with reasonable positioning of the endotracheal tube. The other support tubes are in reasonable position. 2. Bibasilar alveolar opacities consistent with pneumonia. Electronically Signed   By: David  [place] M.D.   On: 12/01/2015 16:14   Dg Chest Port 1 View  11/30/2015  CLINICAL DATA:  PICC placement. Systemic inflammatory response syndrome. Altered mental status. EXAM: PORTABLE CHEST 1 VIEW COMPARISON:  Single view of the chest 11/21/2015. PA and lateral chest 01/17/2015. FINDINGS: NG tube is in place and courses into the stomach and below the inferior margin of the film. Right side PICC is also identified with the tip projecting in the lower superior vena cava. Lung volumes are lower than on the comparison studies with basilar atelectasis. No pneumothorax or pleural effusion. Heart size is normal. IMPRESSION: NG tube  courses into the stomach and below the inferior margin of film. PICC projects in good position. Bibasilar atelectasis in a low volume chest. Electronically Signed   By: Inge Rise M.D.   On: 11/30/2015 07:25   Dg Abd 2 Views  12/01/2015  CLINICAL DATA:  Follow up small bowel obstruction. EXAM: ABDOMEN - 2 VIEW COMPARISON:  Multiple prior radiographs obtained over the last week. FINDINGS: The nasogastric tube appears unchanged, terminating in the mid abdomen. Small bowel distention in the upper abdomen is unchanged. There is some gas within the colon. There is no free intraperitoneal air on the decubitus view. Vascular stents are noted. IMPRESSION: No significant change in residual partial small bowel obstruction compared with recent priors. The degree of bowel distention is improved from the studies done last week. Electronically Signed   By: Richardean Sale M.D.   On: 12/01/2015 08:44   Dg Abd 2 Views  11/29/2015  CLINICAL DATA:  Follow-up small bowel obstruction EXAM: ABDOMEN - 2 VIEW COMPARISON:  11/27/2015 FINDINGS: NG  tube in place again noted. Persistent mild dilated small bowel loops with multiple air-fluid levels consistent with small bowel obstruction. No free abdominal air. IMPRESSION: Persistent mild dilated small bowel loops and multiple air-fluid levels consistent with small bowel obstruction. NG tube in place. No free abdominal air. Electronically Signed   By: Lahoma Crocker M.D.   On: 11/29/2015 11:10   Dg Abd 2 Views  11/27/2015  CLINICAL DATA:  Generalized abdominal pain with nausea vomiting. EXAM: ABDOMEN - 2 VIEW COMPARISON:  11/26/2015 FINDINGS: Upright film shows no evidence for intraperitoneal free air. Gas-filled dilated small bowel loops in the upper abdomen persist but are slightly decreased in distention since yesterday's exam. NG tube tip is in the proximal stomach with proximal port of the NG tube in the region of the GE junction. IMPRESSION: Slight interval improvement in the  proximal small bowel dilatation. Electronically Signed   By: Misty Stanley M.D.   On: 11/27/2015 10:00   Dg Abd 2 Views  11/26/2015  CLINICAL DATA:  Abdominal pain, distension, and weakness. Small bowel obstruction. EXAM: ABDOMEN - 2 VIEW COMPARISON:  11/26/2015 FINDINGS: Nasogastric tube enters the stomach. Dilated loops of small bowel up to 5.8 cm in diameter, formerly 5.1 cm. Air-fluid levels are present in loops of bowel, some and differing vertical levels within the same loop. Indistinct airspace opacity in the retro diaphragmatic region on the left. Trace left pleural effusion. No free intraperitoneal gas observed beneath the hemidiaphragms. Left iliac stent. Possible gas density projecting over the pubis, significance uncertain. IMPRESSION: 1. Worsening small bowel obstruction, dilated loops up to 5.8 cm in diameter currently. 2. Airspace opacity in the left lower lobe with trace left pleural effusion. Electronically Signed   By: Van Clines M.D.   On: 11/26/2015 18:11   Dg Abd 2 Views  11/25/2015  CLINICAL DATA:  Small bowel obstruction.  Nausea. EXAM: ABDOMEN - 2 VIEW COMPARISON:  November 24, 2015. FINDINGS: There remains severe small bowel dilatation with air-fluid levels consistent with distal small bowel obstruction. Nasogastric tube tip is seen in expected position of distal esophagus. Vascular calcifications are noted. IMPRESSION: Continued presence of severe small bowel dilatation with air-fluid levels consistent with distal small bowel obstruction. Distal tip of nasogastric tube seen in expected position of distal esophagus. Electronically Signed   By: Marijo Conception, M.D.   On: 11/25/2015 08:47   Dg Abd 2 Views  11/24/2015  CLINICAL DATA:  Patient had surgery this morning, 11/24/2015, for small-bowel obstruction. History of diabetes. EXAM: ABDOMEN - 2 VIEW COMPARISON:  Abdominal plain film from earlier same day. FINDINGS: There is increased distension of the small bowel, now with  small bowel involvement throughout the abdomen and upper pelvis. Associated air-fluid levels on the decubitus images. IMPRESSION: Significantly increased distention of the small bowel, and now with small bowel involvement throughout the abdomen and upper pelvis. Dr. Erlinda Hong tells me that patient did NOT have surgery so these findings represent a significant worsening of small bowel obstruction, likely a complete small bowel obstruction. Immediate surgical consultation recommended. These results were called by telephone at the time of interpretation on 11/24/2015 at 3:35 pm to Dr. Florencia Reasons , who verbally acknowledged these results. Electronically Signed   By: Franki Cabot M.D.   On: 11/24/2015 15:39   Dg Abd 2 Views  11/24/2015  CLINICAL DATA:  Follow-up small bowel obstruction. Initial encounter. EXAM: ABDOMEN - 2 VIEW COMPARISON:  Abdominal radiograph performed 11/22/2015 FINDINGS: The small bowel is dilated  to 5.2 cm in maximal diameter, again reflecting small bowel obstruction. There is a decreased amount of stool in the colon. No definite free intra-abdominal air is seen. A vascular stent is noted at the left hemipelvis. No acute osseous abnormalities are identified. The visualized lung bases are grossly clear. IMPRESSION: Small bowel dilatation to 5.2 cm in maximal diameter, reflecting persistent small-bowel obstruction. No definite free intra-abdominal air seen. Electronically Signed   By: Garald Balding M.D.   On: 11/24/2015 00:38   Dg Abd Acute W/chest  11/21/2015  CLINICAL DATA:  65 year old presenting with severe acute superimposed upon chronic intermittent generalized abdominal pain, associated with nausea and vomiting. EXAM: DG ABDOMEN ACUTE W/ 1V CHEST COMPARISON:  CT abdomen and pelvis 10/18/2014. Chest x-rays 01/17/2015 and earlier. FINDINGS: Multiple dilated loops of small bowel throughout the abdomen and pelvis demonstrating air-fluid levels on the erect image. Air-fluid level in the mildly  distended and relatively featureless cecum and proximal ascending colon. Gas throughout otherwise normal caliber colon to the rectum. No evidence of free air on the lateral decubitus image. Native abdominal aortic and left common iliac and external iliac artery stents, though the patient has undergone a prior aortobifemoral bypass graft procedure as noted on the prior CT. Cardiac silhouette mildly enlarged, unchanged allowing for differences in technique. Lungs emphysematous though clear. No pleural effusions. IMPRESSION: 1. Small bowel obstruction.  No free intraperitoneal air. 2. Possible ascending colitis. 3. Mild cardiomegaly. COPD/emphysema. No acute cardiopulmonary disease. Electronically Signed   By: Evangeline Dakin M.D.   On: 11/21/2015 16:57   Dg Abd Portable 1v  11/30/2015  CLINICAL DATA:  Nasogastric tube placement.  Initial encounter. EXAM: PORTABLE ABDOMEN - 1 VIEW COMPARISON:  Abdominal radiograph performed 11/29/2015 FINDINGS: The patient's nasogastric tube is noted ending overlying the body of the stomach. There is increased dilatation of small bowel loops, measuring up to 4.8 cm in diameter, concerning for worsening small bowel obstruction. No free intra-abdominal air is seen, though evaluation free air is limited on a single supine view. No acute osseous abnormalities are identified. IMPRESSION: 1. Nasogastric tube noted ending overlying the body of the stomach. 2. Diffuse dilatation of small-bowel loops, measuring up to 4.8 cm in diameter, concerning for worsening small bowel obstruction. No free intra-abdominal air seen. Electronically Signed   By: Garald Balding M.D.   On: 11/30/2015 02:35   Dg Abd Portable 1v-small Bowel Obstruction Protocol-initial, 8 Hr Delay  11/23/2015  CLINICAL DATA:  8 hours following administration of contrast. Assess small-bowel obstruction. Initial encounter. EXAM: PORTABLE ABDOMEN - 1 VIEW COMPARISON:  Abdominal radiograph performed earlier today at 8:49 a.m.,  and CT of the abdomen and pelvis performed 11/21/2015 FINDINGS: Per clinical notes, contrast was administered 8 hours ago. It is not seen at this time. This may reflect diffuse dilution of contrast within the small bowel. There is persistent dilatation of small-bowel loops to 5.2 cm in maximal diameter, compatible with small bowel obstruction. No definite free intra-abdominal air is seen. Residual stool is seen within the colon. Contrast is seen within the bladder. Mild degenerative change is noted along the lumbar spine. The patient's enteric tube is seen ending at the body of the stomach, with the side port at the distal esophagus. A vascular stent is noted along the left common iliac and external iliac arteries. IMPRESSION: Contrast is not seen at this time. This may reflect diffuse dilution of contrast within the small bowel. Persistent dilatation of small-bowel loops to 5.2 cm in maximal diameter,  compatible with small bowel obstruction. No definite free intra-abdominal air seen. 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 Would follow the patient's clinical condition carefully. Electronically Signed   By: Garald Balding M.D.   On: 11/23/2015 01:09   Dg Abd Portable 2v  11/26/2015  CLINICAL DATA:  Small bowel obstruction and abdominal pain. EXAM: PORTABLE ABDOMEN - 2 VIEW COMPARISON:  11/25/2015 and multiple recent abdominal films as well as CT of the abdomen and pelvis on 11/21/2015. FINDINGS: Since the prior examination the nasogastric tube is been advanced into the stomach. Multiple dilated loops of small bowel containing gas are again identified primarily involving proximal jejunal loops. Degree of small bowel dilatation appears slightly improved. No evidence of free intraperitoneal air. The colon remains decompressed. IMPRESSION: Slightly improved small bowel dilatation. Electronically Signed   By: Aletta Edouard M.D.   On: 11/26/2015 09:37     Labs:  CBC:  Recent Labs  12/06/15 0610 12/06/15 1740 12/07/15 0445 12/08/15 0340  WBC 15.2* 14.6* 15.9* 17.6*  HGB 11.1* 11.0* 10.0* 10.8*  HCT 32.4* 31.8* 29.5* 31.7*  PLT 110* 118* 120* 129*    COAGS:  Recent Labs  07/26/15 1415 12/06/15 0925 12/08/15 0340  INR 1.09 1.89* 1.82*  APTT 37 45*  --     BMP:  Recent Labs  12/05/15 0430 12/06/15 0610 12/07/15 0445 12/08/15 0340  NA 142 140 141 137  K 4.4 4.1 4.0 3.9  CL 116* 108 110 104  CO2 22 24 24 26   GLUCOSE 165* 134* 140* 151*  BUN 41* 49* 51* 52*  CALCIUM 8.6* 8.5* 8.4* 8.3*  CREATININE 1.92* 1.92* 1.89* 1.68*  GFRNONAA 35* 35* 36* 42*  GFRAA 41* 41* 42* 48*    LIVER FUNCTION TESTS:  Recent Labs  11/29/15 0452 11/30/15 0415 12/04/15 0447 12/07/15 0445  BILITOT 1.1 0.9 0.5 0.9  AST 20 21 30 27   ALT 18 18 15* 14*  ALKPHOS 58 57 66 72  PROT 5.8* 6.0* 4.6* 5.3*  ALBUMIN 3.3* 3.2* 1.9* 1.8*    TUMOR MARKERS: No results for input(s): AFPTM, CEA, CA199, CHROMGRNA in the last 8760 hours.  Assessment and Plan: 65 y.o. male recently admitted with abdominal pain/SBO and subsequent development of perforated SB, s/p expl lap and closure/washout procedure on 12/01/15. He also has hx of acute RUE DVT and currently on IV heparin. His WBC continues to rise and latest CT A/P demonstrates moderate loculated perihepatic/perisplenic/pelvic ascites. Request now received from [facility] for CT guided drainage of the perihepatic fluid collection.Imaging studies were reviewed by Dr. Kathlene Cote. Procedure planned for later today. IV heparin held. Risks and benefits discussed with the patient including bleeding, infection, damage to adjacent structures, bowel perforation/fistula connection, and sepsis.All of the patient's questions were answered, patient is agreeable to proceed.Consent signed and in chart.     Thank you for this interesting consult.  I greatly enjoyed meeting [name] [name] and look forward to  participating in their care.  A copy of this report was sent to the requesting provider on this date.  Electronically Signed: D. Rowe Robert 12/08/2015, 9:36 AM   I spent a total of 20 minutes in face to face in clinical consultation, greater than 50% of which was counseling/coordinating care for CT guided aspiration/poss drainage of abdominal fluid collection(s)

## 2015-12-08 NOTE — Progress Notes (Signed)
PULMONARY / CRITICAL CARE MEDICINE   Name: [name] [name] MRN: [id number] DOB: 1951-11-12    ADMISSION DATE:  11/21/2015 CONSULTATION DATE:  1/6  REFERRING MD:  Hassell Done   CHIEF COMPLAINT:    HISTORY OF PRESENT ILLNESS:   65 year old aam who was admitted on 12/27 w/ abd pain found to be 2/2 SBO on CT scan. Initially managed conservatively, but then on 1/1 was offered surgery given no significant improvement. He refused surgery for several days. His hospital course was complicated by fever spike and delirium on 1/5. At that time has abd was more distended. He had an EEG which was did not show seizures. He finally agreed to surgery on 1/6 d/t no resolution of abd pain. He went for exploratory lap which was notable for significant peritoneal contamination. He is s/p exploration, washout and repair of perforated viscous. PCCM was asked to assess post op and assist w/ post op critical care support.   PMH of  hypertension, CAD, type 2 diabetes, diabetic peripheral neuropathy, seizure disorder, polysubstance abuse and chronic pain issues   Significant tests/ events 1/10 diuresed 5L with lasix 1/11 duplex >. RUE DVT along PICC, dc'd PICC  1/11 [facility] >>    SUBJECTIVE:  Pain mproved with meds Afebrile on lower O2 Good UO with lasix  VITAL SIGNS: BP 114/50 mmHg  Pulse 74  Temp(Src) 98.2 F (36.8 C) (Oral)  Resp 16  Ht 6\' 2"  (1.88 m)  Wt 157 lb 10.1 oz (71.5 kg)  BMI 20.23 kg/m2  SpO2 98%  HEMODYNAMICS:    VENTILATOR SETTINGS:    INTAKE / OUTPUT: I/O last 3 completed shifts: In: 4218.6 [P.O.:360; I.V.:857.4; IV Piggyback:300] Out: [id number] [Urine:4775]  PHYSICAL EXAMINATION: General:  Chronically ill appearing AAM.  Neuro:  Awake, interactive, oriented to name & place Generalized weakness  HEENT:  NCAT, NRB Cardiovascular:  Tachy rrr; no MRG Lungs:  Clear, equal rise on vent. Crackles bases  Abdomen:  Distended. Mid abd dressing CD&I Musculoskeletal:  Equal st and bulk   Skin:  Dry and intact. LLE non healing ulcer dressing   LABS:  BMET  Recent Labs Lab 12/06/15 0610 12/07/15 0445 12/08/15 0340  NA 140 141 137  K 4.1 4.0 3.9  CL 108 110 104  CO2 24 24 26   BUN 49* 51* 52*  CREATININE 1.92* 1.89* 1.68*  GLUCOSE 134* 140* 151*    Electrolytes  Recent Labs Lab 12/04/15 0447 12/05/15 0430 12/06/15 0610 12/07/15 0445 12/08/15 0340  CALCIUM 8.4* 8.6* 8.5* 8.4* 8.3*  MG 2.0 1.8 1.8 1.6*  --   PHOS 3.4 3.4  --  4.1  --     CBC  Recent Labs Lab 12/06/15 1740 12/07/15 0445 12/08/15 0340  WBC 14.6* 15.9* 17.6*  HGB 11.0* 10.0* 10.8*  HCT 31.8* 29.5* 31.7*  PLT 118* 120* 129*    Coag's  Recent Labs Lab 12/06/15 0925 12/08/15 0340  APTT 45*  --   INR 1.89* 1.82*    Sepsis Markers No results for input(s): LATICACIDVEN, PROCALCITON, O2SATVEN in the last 168 hours.  ABG  Recent Labs Lab 12/01/15 1347 12/01/15 1527 12/02/15 0115  PHART 7.193* 7.330* 7.311*  PCO2ART 55.6* 39.1 40.4  PO2ART 220.0* 209* 81.7    Liver Enzymes  Recent Labs Lab 12/04/15 0447 12/07/15 0445  AST 30 27  ALT 15* 14*  ALKPHOS 66 72  BILITOT 0.5 0.9  ALBUMIN 1.9* 1.8*    Cardiac Enzymes No results for input(s): TROPONINI, PROBNP in the  last 168 hours.  Glucose  Recent Labs Lab 12/07/15 1204 12/07/15 1628 12/07/15 1948 12/08/15 0007 12/08/15 0422 12/08/15 0755  GLUCAP 150* 132* 117* 171* 139* 118*    Imaging Ct Abdomen Pelvis Wo Contrast  12/07/2015  CLINICAL DATA:  Follow-up small bowel obstruction EXAM: CT ABDOMEN AND PELVIS WITHOUT CONTRAST TECHNIQUE: Multidetector CT imaging of the abdomen and pelvis was performed following the standard protocol without IV contrast. COMPARISON:  11/21/2015 FINDINGS: Lung bases shows hazy mild interstitial prominence probable mild interstitial edema. There is small bilateral pleural effusion right greater than left. There is significant atelectasis or infiltrate in right lower lobe  posteriorly. Small atelectasis noted left lower lobe posteriorly. Sagittal images of the spine shows degenerative changes thoracic and lumbar spine. Study is limited without IV contrast. There is moderate perihepatic and perisplenic ascites. Please note there is some loculation of the perihepatic ascites with mild mass effect in the liver dome and anterior liver margin, please see axial image 38 and 15. Peritoneal inflammation or infection cannot be entirely excluded. Clinical correlation is necessary. Old appearing right lower anterior rib fracture. No intrahepatic biliary ductal dilatation. Unenhanced pancreas, spleen and adrenal glands are unremarkable. Unenhanced kidneys shows no nephrolithiasis. No hydronephrosis or hydroureter. A large cyst in lower pole of the right kidney again noted measures 6.6 cm. Six in midpole of the left kidney again noted measures 3 cm. Mild distended small bowel loops are noted in mid abdomen containing oral contrast material and some air-fluid levels. Findings most likely due to significant ileus or less likely improving partial small bowel obstruction. No transition point in caliber of small bowel. There is small amount of contrast material noted in distal colon. Postsurgical changes are noted anterior abdominal wall. Partially open wound noted anterior abdominal wall axial image 67. Clinical correlation is necessary. There is a small right inguinal scrotal canal hernia containing fluid. Small to moderate pelvic ascites is noted. The urinary bladder is unremarkable. Moderate gaseous distension in the mid sigmoid colon. There is significant anasarca infiltration of subcutaneous fat in abdominal and pelvic wall. 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 Limited assessment without IV contrast. Significant disc space flattening with endplate sclerotic changes and vacuum disc phenomenon at L5-S1 level. Moderate ascites noted bilateral pericolic gutters. Tiny amount of air within  urinary bladder probable post instrumentation. IMPRESSION: 1. There is small bilateral pleural effusion right greater than left. Significant atelectasis or infiltrate in right lower lobe posteriorly. There is superimposed mild interstitial prominence bilateral lower lobes probable mild interstitial edema. 2. There is moderate perihepatic and perisplenic ascites. Moderate ascites noted bilateral paracolic gutters. Please note there is some loculation of perihepatic ascites with mild mass effect on the liver contour please see axial images 32 and 15. Peritoneal inflammation or infection cannot be excluded. 3. No hydronephrosis or hydroureter.  Stable bilateral renal cysts. 4. Postsurgical changes are noted anterior abdominal wall. Incomplete healed/open subcutaneous midline anterior abdominal wound. 5. Mild distended small bowel loops containing oral contrast material and some air-fluid levels. There is no transition point in caliber of small bowel. Findings most likely due to significant ileus or residual partial small bowel obstruction. There is some oral contrast material noted within distal colon. 6. Moderate pelvic ascites. Moderate gas noted within mid sigmoid colon probable mild ileus. 7. Significant anasarca infiltration of subcutaneous fat abdominal and pelvic wall. 8. 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 Electronically Signed   By: Lahoma Crocker M.D.   On: 12/07/2015 15:18  STUDIES:  1/5 EEG: no seizures. C/w metabolic changes/meds CT abd 1/12 >> ascites around liver + spleen, partial SBO  CULTURES: BCX2 1/5>>>ng  ANTIBIOTICS: Cefepime 1/5>>> Flagyl 1/5>>> vanc 1/4>>> 1/9 Imipenem 1/4>>>1/5 (stopped d/t concern for seizure)    LINES/TUBES: RUE PICC 1/3>>>1/11 OETT 1/6>>>1/6  [facility] 1/11 >>  DISCUSSION: 65 year old male admitted 12/27 for SBO. Initially managed medically but when sxs not improved surgery was recommended on 1/1; the pt refused @ that time. He  presents to the ICU on full vent support on 1/6 after abd exploration where he was found to have perforated viscous and peritonitis. He underwent washout & repair.  Course complicated by ileus & RUE DVT  ASSESSMENT / PLAN:  PULMONARY A: POst op resp failure s/p exploratory laparotomy , continues to require high O2 - Edema &  atelectasis - improved with diuresis H/o nicotine abuse  P:  Ct  [place] IS q 2h   RENAL A:   AKI - improving P:   Renal dose meds  Keep even to neg balance with lasix as needed  GASTROINTESTINAL A:   SBO w/ perforated viscous and resultant peritonitis  Ileus Severe Protein calorie malnutrition - underweight, alb 1.9 Perihepatic asictes  P:   Tolerating liquids Cont TPN until PO intake adequate Wound care per surgery , drain planned  PRN Fentanyl   HEMATOLOGIC A:   Neutropenia - resolved Dilutional anemia w/expected post-op blood loss  Thrombocytopenia RUE DVT , PICC dc'd P:  Trend CBC Monitor plts on heparin Ct  IV heparin Consider coumadin once acute issues resolved    INFECTIOUS A:   Peritonitis in setting of perforation d/t SBO -High risk abscess formation Severe sepsis  P:   Dc vanc, ct cefepime x 10-14 days Added flagyl Dc CVL or change to PICC after 10 ds -around 1/21   NEUROLOGIC A:   Acute Encephalopathy in setting of sepsis  H/o seizure d/o-Off AEDs X 2 mo before admit-->EEG 1/5 negative. Severe deconditioning  P:   Try to mobilise with PT Watch closely for evidence of seizures.  If seizure activity would load Keppra   FAMILY  - Updates: none available  - Inter-disciplinary family meet or Palliative Care meeting due by:  1/13  Summary -Expect prolonged course with peritonitis,advancing PO , delerium & oliguria , hypoxia - improved, RUE DVT, malnutriton & rising WBCs , perhaps due to abdominal abscesses are present concerns   Can transfer to floor PCCM to sign off   Ilyse Tremain V. MD   12/08/2015, 9:30 AM

## 2015-12-08 NOTE — Plan of Care (Signed)
Problem: Tissue Perfusion: Goal: Risk factors for ineffective tissue perfusion will decrease Outcome: Progressing Pt on heparin gtt, SCDs placed on bilateral lower extremities

## 2015-12-08 NOTE — Progress Notes (Addendum)
Patient ID: [name] [name], male   DOB: 12-24-1950, 65 y.o.   MRN: [id number] [name] PROGRESS NOTE  [name] [name] [id number] DOB: 12-13-1950 DOA: 11/21/2015 PCP: Marijean Bravo, MD  Brief narrative:    65 year old male with a past medical history of hyperlipidemia, hypertension, CAD, type 2 diabetes, diabetic peripheral neuropathy, seizure disorder, polysubstance abuse and chronic pain issues (on high doses of oxycodone prior to admission) who was admitted 11/22/15 with a small bowel obstruction. Surgical consultation was obtained on admission with conservative therapy initially recommended. Patient's bowel obstruction was felt to be from adhesions and possible narcotic bowel. His NG tube was removed 11/23/15 and he seemed to be improving clinically but pain returned 11/24/15 with diet advancement to clears. Repeat films showed worsening small bowel dilatation so his NG tube was reinserted. TPN was subsequently ordered. On 11/27/15, the patient refused PICC line insertion for TPN and surgical intervention. PICC finally placed 11/28/15 with initiation of TPN.   Further, patient has developed a right upper extremity DVT and currently is on IV heparin.  On 11/30/15, the patient's condition deteriorated acutely and he was found to be confused and restless. Rapid response was called. He was febrile and tachycardic with an elevated lactate level. Due to concerns for sepsis, broad-spectrum antibiotics were initiated with vancomycin and Primaxin which was switched to cefepime given his seizure history. Patient uderwent exploratory laparotomy on 12/01/15 with findings of perforated viscus with peritoneal contamination/peritonitis. He remained intubated postoperatively and was under the care of the critical care team. He self extubated 12/02/15. Surgery continues to follow, patient has an open abdominal wound. He continues to have an ileus and is on TNA at this time. [name] assumed care  12/05/15.  Transfer to telemetry floor today 12/08/2015  Assessment/Plan:    Principal problem: SBO w/ perforated viscous and resultant peritonitis / leukocytosis / sepsis - Likely secondary to adhesions versus narcotic bowel. Patient initially declined surgical intervention but then since pain did not improve he finally underwent exploratory laparotomy 12/01/2015 - Appreciate surgery following and their recommendations  - Patient continues to have abdominal pain  - He is currently on TPN for nutritional support  - His white blood cell count is rising and latest CT scan of abdomen and pelvis demonstrated moderate loculated perihepatic/perisplenic/pelvic ascites and surgery requested interventional radiology consultation for CT-guided drainage of perihepatic fluid collection appreciate interventional radiology seen the patient in consultation - Continue current antibiotics, cefepime and Flagyl, dosing per pharmacy  Active problems:  Severe Protein calorie malnutrition / underweight - In the context of acute on chronic illness, small bowel obstruction and perforated viscus - Patient is on TPN for nutritional support  Acute upper extremity DVT - US DVT right distal axillary vein, subclavian vein.  - Continue heparin drip   Post operative respiratory failure   - Patient required ventilatory support after exploratory laparotomy 12/01/2015.  - Patient self extubated 12/02/2015 - Worsening hypoxemia follow-up to be in the setting of pulmonary edema. Chest x-ray at that time showed pulmonary interstitial infiltrates - Respiratory status has improved with Lasix 80 mg IV given 12/05/2015  Acute Encephalopathy in setting of sepsis / history of seizure disorder - EEG done 11/30/2015 showed no acute seizures - If seizures occur then per pulmonary load with Keppra  Acute kidney injury - In the setting of sepsis and small bowel obstruction with perforated viscus - Creatinine improving, 1.68 at  this morning  Anemia of chronic disease / thrombocytopenia - May be related  to history of substance abuse. - Hemoglobin is 10.8 this morning and platelets are 129 - Continue to monitor daily CBC  Diabetes mellitus type 2 with peripheral circulatory complications without long-term insulin use -  Currently on sliding scale insulin   Left lower extremity wound / S/p left fem pop bypass w/ non-healing ulcer - Seen by wound care in consultation, appreciate their assessment   DVT Prophylaxis  - SCD's bilaterally   Code Status: Full.  Family Communication:  plan of care discussed with the patient, family not at the bedside this am Disposition Plan: transfer to telemetry.   IV access:  Central line   Procedures and diagnostic studies from 12/05/2015:  Ct Abdomen Pelvis Wo Contrast 12/07/2015   1. There is small bilateral pleural effusion right greater than left. Significant atelectasis or infiltrate in right lower lobe posteriorly. There is superimposed mild interstitial prominence bilateral lower lobes probable mild interstitial edema. 2. There is moderate perihepatic and perisplenic ascites. Moderate ascites noted bilateral paracolic gutters. Please note there is some loculation of perihepatic ascites with mild mass effect on the liver contour please see axial images 32 and 15. Peritoneal inflammation or infection cannot be excluded. 3. No hydronephrosis or hydroureter.  Stable bilateral renal cysts. 4. Postsurgical changes are noted anterior abdominal wall. Incomplete healed/open subcutaneous midline anterior abdominal wound. 5. Mild distended small bowel loops containing oral contrast material and some air-fluid levels. There is no transition point in caliber of small bowel. Findings most likely due to significant ileus or residual partial small bowel obstruction. There is some oral contrast material noted within distal colon. 6. Moderate pelvic ascites. Moderate gas noted within mid sigmoid colon  probable mild ileus. 7. Significant anasarca infiltration of subcutaneous fat abdominal and pelvic wall. 8. 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 Electronically Signed   By: Lahoma Crocker M.D.   On: 12/07/2015 15:18   Dg Chest Port 1 View 12/06/2015  Stable bilateral pulmonary edema, with right basilar subsegmental atelectasis and associated pleural effusion. Interval placement of left internal jugular catheter with distal tip in SVC. No pneumothorax is noted. Electronically Signed   By: Marijo Conception, M.D.   On: 12/06/2015 16:10   Dg Chest Port 1 View  12/05/2015   Worsening of pulmonary interstitial infiltrates bilaterally. This may reflect pulmonary edema of cardiac or noncardiac cause or could reflect pneumonia. There are small bilateral pleural effusions. Electronically Signed   By: David  Martinique M.D.   On: 12/05/2015 08:52   Exploratory laparotomy on 12/01/15   Medical Consultants:  Surgery  CCM transfer care to [facility] PCT 12/08/2015  Other Consultants:  Nutrition PT  IAnti-Infectives:   Flagyl 11-30-2015 --> Cefepime 12-02-2015 -->   Leisa Lenz, MD  [name] Pager [phone]  Time spent in minutes: 25 minutes  If 7PM-7AM, please contact night-coverage [email or web address] Password [email or web address] 12/08/2015, 8:33 AM   LOS: 17 days    HPI/Subjective: No acute overnight events. Patient reports pain is 10/10.  Objective: Filed Vitals:   12/08/15 0600 12/08/15 0700 12/08/15 0800 12/08/15 0809  BP: 154/103 145/82 159/86   Pulse: 84 75 77   Temp:    98.2 F (36.8 C)  TempSrc:    Oral  Resp: 8 13 14    Height:      Weight:      SpO2: 96% 95% 96%     Intake/Output Summary (Last 24 hours) at 12/08/15 0833 Last data filed at 12/08/15 0808  Gross per 24 hour  Intake 2869.11 ml  Output   3775 ml  Net -905.89 ml    Exam:   General:  Pt is alert, not in acute distress  Cardiovascular: Regular rate and rhythm, S1/S2 (+)  Respiratory:no wheezing, no  crackles, no rhonchi  Abdomen: Soft, tenderness diffusely, bowel sounds present  Extremities: No edema, pulses DP and PT palpable bilaterally  Neuro: Grossly nonfocal  Data Reviewed: Basic Metabolic Panel:  Recent Labs Lab 12/02/15 0508  12/03/15 0545 12/04/15 0447 12/05/15 0430 12/06/15 0610 12/07/15 0445 12/08/15 0340  NA 132*  < > 141 141 142 140 141 137  K 5.5*  < > 3.8 4.4 4.4 4.1 4.0 3.9  CL 105  < > 112* 115* 116* 108 110 104  CO2 22  < > 21* 22 22 24 24 26   GLUCOSE 719*  < > 154* 161* 165* 134* 140* 151*  BUN 29*  < > 39* 42* 41* 49* 51* 52*  CREATININE 1.62*  < > 1.89* 1.88* 1.92* 1.92* 1.89* 1.68*  CALCIUM 7.7*  < > 7.8* 8.4* 8.6* 8.5* 8.4* 8.3*  MG 2.2  --  1.6* 2.0 1.8 1.8 1.6*  --   PHOS 6.3*  --  1.9* 3.4 3.4  --  4.1  --   < > = values in this interval not displayed. Liver Function Tests:  Recent Labs Lab 12/04/15 0447 12/07/15 0445  AST 30 27  ALT 15* 14*  ALKPHOS 66 72  BILITOT 0.5 0.9  PROT 4.6* 5.3*  ALBUMIN 1.9* 1.8*   No results for input(s): LIPASE, AMYLASE in the last 168 hours. No results for input(s): AMMONIA in the last 168 hours. CBC:  Recent Labs Lab 12/04/15 0447 12/06/15 0610 12/06/15 1740 12/07/15 0445 12/08/15 0340  WBC 10.7* 15.2* 14.6* 15.9* 17.6*  NEUTROABS 8.0*  --   --   --   --   HGB 11.0* 11.1* 11.0* 10.0* 10.8*  HCT 32.8* 32.4* 31.8* 29.5* 31.7*  MCV 87.5 84.6 84.6 85.0 85.4  PLT 133* 110* 118* 120* 129*   Cardiac Enzymes: No results for input(s): CKTOTAL, CKMB, CKMBINDEX, TROPONINI in the last 168 hours. BNP: Invalid input(s): POCBNP CBG:  Recent Labs Lab 12/07/15 1628 12/07/15 1948 12/08/15 0007 12/08/15 0422 12/08/15 0755  GLUCAP 132* 117* 171* 139* 118*    Recent Results (from the past 240 hour(s))  Culture, Urine     Status: None   Collection Time: 11/30/15  6:21 AM  Result Value Ref Range Status   Specimen Description URINE, CLEAN CATCH  Final   Special Requests NONE  Final   Culture    Final    NO GROWTH 1 DAY Performed at [facility]    Report Status 12/01/2015 FINAL  Final  Culture, blood (Routine X 2) w Reflex to ID Panel     Status: None   Collection Time: 11/30/15  6:31 AM  Result Value Ref Range Status   Specimen Description BLOOD LEFT ARM  Final   Special Requests IN PEDIATRIC BOTTLE [place]  Final   Culture   Final    NO GROWTH 5 DAYS Performed at [facility]    Report Status 12/05/2015 FINAL  Final  Culture, blood (Routine X 2) w Reflex to ID Panel     Status: None   Collection Time: 11/30/15  6:45 AM  Result Value Ref Range Status   Specimen Description BLOOD RIGHT PICC LINE  Final   Special Requests BOTTLES DRAWN AEROBIC AND ANAEROBIC 10CC  Final  Culture   Final    NO GROWTH 5 DAYS Performed at [facility]    Report Status 12/05/2015 FINAL  Final  MRSA PCR Screening     Status: None   Collection Time: 12/04/15  9:57 AM  Result Value Ref Range Status   MRSA by PCR NEGATIVE NEGATIVE Final    Comment:        The GeneXpert MRSA Assay (FDA approved for NASAL specimens only), is one component of a comprehensive MRSA colonization surveillance program. It is not intended to diagnose MRSA infection nor to guide or monitor treatment for MRSA infections.      Scheduled Meds: . antiseptic oral rinse  7 mL Mouth Rinse q12n4p  . bacitracin   Topical Daily  . ceFEPime (MAXIPIME) IV  1 g Intravenous Q12H  . chlorhexidine  15 mL Mouth Rinse BID  . collagenase  1 application Topical Daily  . insulin aspart  0-15 Units Subcutaneous 6 times per day  . lip balm  1 application Topical BID  . metronidazole  500 mg Intravenous Q6H  . pantoprazole (PROTONIX) IV  40 mg Intravenous Q12H  . sodium chloride  3 mL Intravenous Q12H  . thiamine  100 mg Oral Daily   Or  . thiamine  100 mg Intravenous Daily   Continuous Infusions: . Marland KitchenTPN (CLINIMIX-E) Adult 65 mL/hr at 12/07/15 1732   And  . fat emulsion 240 mL (12/07/15 1732)  . heparin  1,800 Units/hr (12/08/15 0424)

## 2015-12-08 NOTE — Progress Notes (Signed)
Pharmacy Consult Note - Heparin Follow Up  Labs: heparin level =0.41   A/P: Heparin level therapeutic (goal 0.3-0.7) on current rate of 1800 units/hr. No problems per RN. Continue heparin drip @ 1800 units/hr.  Recheck HL at 11am today to ensure stays in therapeutic range.    Dorrene German 12/08/2015 5:32 AM

## 2015-12-08 NOTE — Progress Notes (Signed)
Pharmacy - IV heparin  Assessment:    Please see note from Hershal Coria, PharmD earlier today for full details.  Briefly, 65 y.o. male on IV heparin for new RUE DVT.  Heparin held today for CT guided abscess drainage and resumed this afternoon.   1st heparin level after restart is therapeutic on previous rate of 1800 units/hr  Plan:   Continue heparin at 1800 units/hr  Will resume daily heparin level  Reuel Boom, PharmD, BCPS Pager: [phone] 12/08/2015, 9:15 PM

## 2015-12-09 LAB — GLUCOSE, CAPILLARY
GLUCOSE-CAPILLARY: 138 mg/dL — AB (ref 65–99)
GLUCOSE-CAPILLARY: 98 mg/dL (ref 65–99)
Glucose-Capillary: 107 mg/dL — ABNORMAL HIGH (ref 65–99)
Glucose-Capillary: 116 mg/dL — ABNORMAL HIGH (ref 65–99)
Glucose-Capillary: 152 mg/dL — ABNORMAL HIGH (ref 65–99)

## 2015-12-09 LAB — HEPARIN LEVEL (UNFRACTIONATED): HEPARIN UNFRACTIONATED: 0.38 [IU]/mL (ref 0.30–0.70)

## 2015-12-09 MED ORDER — INSULIN REGULAR HUMAN 100 UNIT/ML IJ SOLN
INTRAMUSCULAR | Status: AC
Start: 2015-12-09 — End: 2015-12-10
  Administered 2015-12-09: 18:00:00 via INTRAVENOUS
  Filled 2015-12-09: qty 1560

## 2015-12-09 MED ORDER — FAT EMULSION 20 % IV EMUL
240.0000 mL | INTRAVENOUS | Status: AC
  Administered 2015-12-09: 240 mL via INTRAVENOUS
  Filled 2015-12-09: qty 250

## 2015-12-09 NOTE — Progress Notes (Signed)
Patient ID: [name] [name], male   DOB: 1951/10/09, 65 y.o.   MRN: [id number] [facility] Surgery Progress Note:   8 Days Post-Op  Subjective: Mental status is more alert.   Objective: Vital signs in last 24 hours: Temp:  [97.8 F (36.6 C)-98.5 F (36.9 C)] 98.5 F (36.9 C) (01/14 0410) Pulse Rate:  [52-94] 72 (01/14 0410) Resp:  [9-23] 16 (01/14 0410) BP: (111-159)/(50-98) 140/78 mmHg (01/14 0410) SpO2:  [9 %-100 %] 97 % (01/14 0410)  Intake/Output from previous day: 01/13 0701 - 01/14 0700 In: 2684 [I.V.:444; IV Piggyback:500; TPN:1725] Out: 3150 [Urine:2850; Drains:300] Intake/Output this shift:    Physical Exam: Work of breathing is not labored.  Drains in place.  Wound care.    Lab Results:  Results for orders placed or performed during the hospital encounter of 11/21/15 (from the past 48 hour(s))  Heparin level (unfractionated)     Status: Abnormal   Collection Time: 12/07/15  8:45 AM  Result Value Ref Range   Heparin Unfractionated 0.21 (L) 0.30 - 0.70 IU/mL    Comment:        IF HEPARIN RESULTS ARE BELOW EXPECTED VALUES, AND PATIENT DOSAGE HAS BEEN CONFIRMED, SUGGEST FOLLOW UP TESTING OF ANTITHROMBIN III LEVELS.   Glucose, capillary     Status: Abnormal   Collection Time: 12/07/15  8:51 AM  Result Value Ref Range   Glucose-Capillary 154 (H) 65 - 99 mg/dL  Glucose, capillary     Status: Abnormal   Collection Time: 12/07/15 12:04 PM  Result Value Ref Range   Glucose-Capillary 150 (H) 65 - 99 mg/dL  Glucose, capillary     Status: Abnormal   Collection Time: 12/07/15  4:28 PM  Result Value Ref Range   Glucose-Capillary 132 (H) 65 - 99 mg/dL  Glucose, capillary     Status: Abnormal   Collection Time: 12/07/15  7:48 PM  Result Value Ref Range   Glucose-Capillary 117 (H) 65 - 99 mg/dL   Comment 1 Notify RN    Comment 2 Document in Chart   Heparin level (unfractionated)     Status: Abnormal   Collection Time: 12/07/15  8:00 PM  Result Value Ref Range    Heparin Unfractionated <0.10 (L) 0.30 - 0.70 IU/mL    Comment:        IF HEPARIN RESULTS ARE BELOW EXPECTED VALUES, AND PATIENT DOSAGE HAS BEEN CONFIRMED, SUGGEST FOLLOW UP TESTING OF ANTITHROMBIN III LEVELS.   Glucose, capillary     Status: Abnormal   Collection Time: 12/08/15 12:07 AM  Result Value Ref Range   Glucose-Capillary 171 (H) 65 - 99 mg/dL   Comment 1 Notify RN    Comment 2 Document in Chart   CBC     Status: Abnormal   Collection Time: 12/08/15  3:40 AM  Result Value Ref Range   WBC 17.6 (H) 4.0 - 10.5 K/uL   RBC 3.71 (L) 4.22 - 5.81 MIL/uL   Hemoglobin 10.8 (L) 13.0 - 17.0 g/dL   HCT 31.7 (L) 39.0 - 52.0 %   MCV 85.4 78.0 - 100.0 fL   MCH 29.1 26.0 - 34.0 pg   MCHC 34.1 30.0 - 36.0 g/dL   RDW 14.9 11.5 - 15.5 %   Platelets 129 (L) 150 - 400 K/uL  Heparin level (unfractionated)     Status: None   Collection Time: 12/08/15  3:40 AM  Result Value Ref Range   Heparin Unfractionated 0.41 0.30 - 0.70 IU/mL    Comment:  IF HEPARIN RESULTS ARE BELOW EXPECTED VALUES, AND PATIENT DOSAGE HAS BEEN CONFIRMED, SUGGEST FOLLOW UP TESTING OF ANTITHROMBIN III LEVELS.   Protime-INR     Status: Abnormal   Collection Time: 12/08/15  3:40 AM  Result Value Ref Range   Prothrombin Time 21.0 (H) 11.6 - 15.2 seconds   INR 1.82 (H) 0.00 - 8.18  Basic metabolic panel     Status: Abnormal   Collection Time: 12/08/15  3:40 AM  Result Value Ref Range   Sodium 137 135 - 145 mmol/L   Potassium 3.9 3.5 - 5.1 mmol/L   Chloride 104 101 - 111 mmol/L   CO2 26 22 - 32 mmol/L   Glucose, Bld 151 (H) 65 - 99 mg/dL   BUN 52 (H) 6 - 20 mg/dL   Creatinine, Ser 1.68 (H) 0.61 - 1.24 mg/dL   Calcium 8.3 (L) 8.9 - 10.3 mg/dL   GFR calc non Af Amer 42 (L) >60 mL/min   GFR calc Af Amer 48 (L) >60 mL/min    Comment: (NOTE) The eGFR has been calculated using the CKD EPI equation. This calculation has not been validated in all clinical situations. eGFR's persistently <60 mL/min signify  possible Chronic Kidney Disease.    Anion gap 7 5 - 15  Glucose, capillary     Status: Abnormal   Collection Time: 12/08/15  4:22 AM  Result Value Ref Range   Glucose-Capillary 139 (H) 65 - 99 mg/dL   Comment 1 Notify RN    Comment 2 Document in Chart   Glucose, capillary     Status: Abnormal   Collection Time: 12/08/15  7:55 AM  Result Value Ref Range   Glucose-Capillary 118 (H) 65 - 99 mg/dL  Culture, routine-abscess     Status: None (Preliminary result)   Collection Time: 12/08/15 12:41 PM  Result Value Ref Range   Specimen Description ABSCESS RLQ    Special Requests NONE    Gram Stain PENDING    Culture      NO GROWTH 1 DAY Performed at [facility]    Report Status PENDING   Culture, routine-abscess     Status: None (Preliminary result)   Collection Time: 12/08/15 12:41 PM  Result Value Ref Range   Specimen Description ABSCESS PERIHEPATIC RUQ     Special Requests NONE    Gram Stain PENDING    Culture      NO GROWTH 1 DAY Performed at [facility]    Report Status PENDING   Culture, routine-abscess     Status: None (Preliminary result)   Collection Time: 12/08/15 12:41 PM  Result Value Ref Range   Specimen Description ABSCESS LUQ    Special Requests NONE    Gram Stain PENDING    Culture      NO GROWTH 1 DAY Performed at [facility]    Report Status PENDING   Glucose, capillary     Status: Abnormal   Collection Time: 12/08/15  1:00 PM  Result Value Ref Range   Glucose-Capillary 153 (H) 65 - 99 mg/dL  Glucose, capillary     Status: Abnormal   Collection Time: 12/08/15  3:46 PM  Result Value Ref Range   Glucose-Capillary 169 (H) 65 - 99 mg/dL  Glucose, capillary     Status: Abnormal   Collection Time: 12/08/15  8:08 PM  Result Value Ref Range   Glucose-Capillary 125 (H) 65 - 99 mg/dL  Heparin level (unfractionated)     Status: None  Collection Time: 12/08/15 10:00 PM  Result Value Ref Range   Heparin Unfractionated 0.40 0.30 -  0.70 IU/mL    Comment:        IF HEPARIN RESULTS ARE BELOW EXPECTED VALUES, AND PATIENT DOSAGE HAS BEEN CONFIRMED, SUGGEST FOLLOW UP TESTING OF ANTITHROMBIN III LEVELS.   Glucose, capillary     Status: Abnormal   Collection Time: 12/08/15 11:41 PM  Result Value Ref Range   Glucose-Capillary 108 (H) 65 - 99 mg/dL  Glucose, capillary     Status: Abnormal   Collection Time: 12/09/15  4:14 AM  Result Value Ref Range   Glucose-Capillary 138 (H) 65 - 99 mg/dL   Comment 1 Notify RN   Heparin level (unfractionated)     Status: None   Collection Time: 12/09/15  6:43 AM  Result Value Ref Range   Heparin Unfractionated 0.38 0.30 - 0.70 IU/mL    Comment:        IF HEPARIN RESULTS ARE BELOW EXPECTED VALUES, AND PATIENT DOSAGE HAS BEEN CONFIRMED, SUGGEST FOLLOW UP TESTING OF ANTITHROMBIN III LEVELS.   Glucose, capillary     Status: Abnormal   Collection Time: 12/09/15  7:56 AM  Result Value Ref Range   Glucose-Capillary 107 (H) 65 - 99 mg/dL   Comment 1 Notify RN     Radiology/Results: Ct Abdomen Pelvis Wo Contrast  12/07/2015  CLINICAL DATA:  Follow-up small bowel obstruction EXAM: CT ABDOMEN AND PELVIS WITHOUT CONTRAST TECHNIQUE: Multidetector CT imaging of the abdomen and pelvis was performed following the standard protocol without IV contrast. COMPARISON:  11/21/2015 FINDINGS: Lung bases shows hazy mild interstitial prominence probable mild interstitial edema. There is small bilateral pleural effusion right greater than left. There is significant atelectasis or infiltrate in right lower lobe posteriorly. Small atelectasis noted left lower lobe posteriorly. Sagittal images of the spine shows degenerative changes thoracic and lumbar spine. Study is limited without IV contrast. There is moderate perihepatic and perisplenic ascites. Please note there is some loculation of the perihepatic ascites with mild mass effect in the liver dome and anterior liver margin, please see axial image 38 and  15. Peritoneal inflammation or infection cannot be entirely excluded. Clinical correlation is necessary. Old appearing right lower anterior rib fracture. No intrahepatic biliary ductal dilatation. Unenhanced pancreas, spleen and adrenal glands are unremarkable. Unenhanced kidneys shows no nephrolithiasis. No hydronephrosis or hydroureter. A large cyst in lower pole of the right kidney again noted measures 6.6 cm. Six in midpole of the left kidney again noted measures 3 cm. Mild distended small bowel loops are noted in mid abdomen containing oral contrast material and some air-fluid levels. Findings most likely due to significant ileus or less likely improving partial small bowel obstruction. No transition point in caliber of small bowel. There is small amount of contrast material noted in distal colon. Postsurgical changes are noted anterior abdominal wall. Partially open wound noted anterior abdominal wall axial image 67. Clinical correlation is necessary. There is a small right inguinal scrotal canal hernia containing fluid. Small to moderate pelvic ascites is noted. The urinary bladder is unremarkable. Moderate gaseous distension in the mid sigmoid colon. There is significant anasarca infiltration of subcutaneous fat in abdominal and pelvic wall. 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 Limited assessment without IV contrast. Significant disc space flattening with endplate sclerotic changes and vacuum disc phenomenon at L5-S1 level. Moderate ascites noted bilateral pericolic gutters. Tiny amount of air within urinary bladder probable post instrumentation. IMPRESSION: 1. There is small  bilateral pleural effusion right greater than left. Significant atelectasis or infiltrate in right lower lobe posteriorly. There is superimposed mild interstitial prominence bilateral lower lobes probable mild interstitial edema. 2. There is moderate perihepatic and perisplenic ascites. Moderate ascites noted bilateral  paracolic gutters. Please note there is some loculation of perihepatic ascites with mild mass effect on the liver contour please see axial images 32 and 15. Peritoneal inflammation or infection cannot be excluded. 3. No hydronephrosis or hydroureter.  Stable bilateral renal cysts. 4. Postsurgical changes are noted anterior abdominal wall. Incomplete healed/open subcutaneous midline anterior abdominal wound. 5. Mild distended small bowel loops containing oral contrast material and some air-fluid levels. There is no transition point in caliber of small bowel. Findings most likely due to significant ileus or residual partial small bowel obstruction. There is some oral contrast material noted within distal colon. 6. Moderate pelvic ascites. Moderate gas noted within mid sigmoid colon probable mild ileus. 7. Significant anasarca infiltration of subcutaneous fat abdominal and pelvic wall. 8. 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 Electronically Signed   By: Lahoma Crocker M.D.   On: 12/07/2015 15:18   Ct Image Guided Drainage By Percutaneous Catheter  12/08/2015  CLINICAL DATA:  Small bowel perforation with multiple post- operative peritoneal fluid collections and increasing leukocytosis. EXAM: 1. CT-GUIDED PERCUTANEOUS DRAINAGE OF PERIHEPATIC PERITONEAL ABSCESS 2. CT-GUIDED PERCUTANEOUS DRAINAGE OF RIGHT LOWER QUADRANT PERITONEAL ABSCESS 3. CT-GUIDED PERCUTANEOUS DRAINAGE OF LEFT UPPER QUADRANT PERITONEAL ABSCESS 4. CT-GUIDED PERCUTANEOUS DRAINAGE OF LEFT PELVIC PERITONEAL ABSCESS ANESTHESIA/SEDATION: 5.0 Mg IV Versed 100 mcg IV Fentanyl Total Moderate Sedation Time:  55 minutes PROCEDURE: The procedure, risks, benefits, and alternatives were explained to the patient. Questions regarding the procedure were encouraged and answered. The patient understands and consents to the procedure. CT was performed in a supine position. The right and left abdominal walls were prepped with Betadine in a sterile  fashion, and a sterile drape was applied covering the operative field. A sterile gown and sterile gloves were used for the procedure. Local anesthesia was provided with 1% Lidocaine. Under CT guidance, a 18 gauge needle was advanced into a right upper quadrant perihepatic abscess. Aspiration of fluid was performed and a sample sent for culture analysis. A guidewire was advanced into the collection. A 10 French percutaneous drainage catheter was then advanced over the wire. Catheter positioning was confirmed by CT. Under CT guidance, an 18 gauge needle was advanced into a left upper quadrant abscess. A fluid sample was aspirated and sent for culture analysis. A guidewire was advanced. A 10 French percutaneous drainage catheter was advanced over the wire. Catheter positioning was confirmed by CT. Under CT guidance, an 18 gauge needle was advanced into a right lower quadrant abscess. Fluid was aspirated and a sample sent for culture analysis. A guidewire was placed. The tract was dilated and a 10 French percutaneous drainage catheter placed. Catheter positioning was confirmed by CT. Under CT guidance, a 5 [place] Yueh centesis catheter was inserted into abscess fluid localizing to the left pelvis. After confirming catheter positioning by CT, manual aspiration drainage was performed with syringes. The drainage catheter was slowly retracted. The catheter was then removed. The 3 percutaneous drains were secured with Prolene retention sutures and StatLock devices. All 3 drains were connected to suction bulbs. COMPLICATIONS: None FINDINGS: The right perihepatic collection yielded bloody fluid. The other collections yielded yellowish and slightly turbid fluid. The left pelvic fluid was the least turbid and appeared quite clear. 60 mL of fluid  was able to be aspirated from a pocket in the left pelvis with no further return. An indwelling drain was not left in place in this region. IMPRESSION: CT-guided catheter drainage of 4  separate fluid collections in the peritoneal cavity. 10 French drains were placed in a right upper quadrant perihepatic abscess which yielded bloody fluid, a left upper quadrant abscess yielding yellowish turbid fluid and a right lower quadrant abscess also yielding slightly turbid yellowish fluid. A left pelvic fluid collection was drained with a 5 French centesis catheter yielding 60 mL of serous fluid. A drain was not left in place in this collection. Electronically Signed   By: Aletta Edouard M.D.   On: 12/08/2015 15:44   Ct Image Guided Drainage Percut Cath  Peritoneal Retroperit  12/08/2015  CLINICAL DATA:  Small bowel perforation with multiple post- operative peritoneal fluid collections and increasing leukocytosis. EXAM: 1. CT-GUIDED PERCUTANEOUS DRAINAGE OF PERIHEPATIC PERITONEAL ABSCESS 2. CT-GUIDED PERCUTANEOUS DRAINAGE OF RIGHT LOWER QUADRANT PERITONEAL ABSCESS 3. CT-GUIDED PERCUTANEOUS DRAINAGE OF LEFT UPPER QUADRANT PERITONEAL ABSCESS 4. CT-GUIDED PERCUTANEOUS DRAINAGE OF LEFT PELVIC PERITONEAL ABSCESS ANESTHESIA/SEDATION: 5.0 Mg IV Versed 100 mcg IV Fentanyl Total Moderate Sedation Time:  55 minutes PROCEDURE: The procedure, risks, benefits, and alternatives were explained to the patient. Questions regarding the procedure were encouraged and answered. The patient understands and consents to the procedure. CT was performed in a supine position. The right and left abdominal walls were prepped with Betadine in a sterile fashion, and a sterile drape was applied covering the operative field. A sterile gown and sterile gloves were used for the procedure. Local anesthesia was provided with 1% Lidocaine. Under CT guidance, a 18 gauge needle was advanced into a right upper quadrant perihepatic abscess. Aspiration of fluid was performed and a sample sent for culture analysis. A guidewire was advanced into the collection. A 10 French percutaneous drainage catheter was then advanced over the wire. Catheter  positioning was confirmed by CT. Under CT guidance, an 18 gauge needle was advanced into a left upper quadrant abscess. A fluid sample was aspirated and sent for culture analysis. A guidewire was advanced. A 10 French percutaneous drainage catheter was advanced over the wire. Catheter positioning was confirmed by CT. Under CT guidance, an 18 gauge needle was advanced into a right lower quadrant abscess. Fluid was aspirated and a sample sent for culture analysis. A guidewire was placed. The tract was dilated and a 10 French percutaneous drainage catheter placed. Catheter positioning was confirmed by CT. Under CT guidance, a 5 [place] Yueh centesis catheter was inserted into abscess fluid localizing to the left pelvis. After confirming catheter positioning by CT, manual aspiration drainage was performed with syringes. The drainage catheter was slowly retracted. The catheter was then removed. The 3 percutaneous drains were secured with Prolene retention sutures and StatLock devices. All 3 drains were connected to suction bulbs. COMPLICATIONS: None FINDINGS: The right perihepatic collection yielded bloody fluid. The other collections yielded yellowish and slightly turbid fluid. The left pelvic fluid was the least turbid and appeared quite clear. 60 mL of fluid was able to be aspirated from a pocket in the left pelvis with no further return. An indwelling drain was not left in place in this region. IMPRESSION: CT-guided catheter drainage of 4 separate fluid collections in the peritoneal cavity. 10 French drains were placed in a right upper quadrant perihepatic abscess which yielded bloody fluid, a left upper quadrant abscess yielding yellowish turbid fluid and a right lower quadrant abscess also  yielding slightly turbid yellowish fluid. A left pelvic fluid collection was drained with a 5 French centesis catheter yielding 60 mL of serous fluid. A drain was not left in place in this collection. Electronically Signed   By:  Aletta Edouard M.D.   On: 12/08/2015 15:44   Ct Image Guided Drainage Percut Cath  Peritoneal Retroperit  12/08/2015  CLINICAL DATA:  Small bowel perforation with multiple post- operative peritoneal fluid collections and increasing leukocytosis. EXAM: 1. CT-GUIDED PERCUTANEOUS DRAINAGE OF PERIHEPATIC PERITONEAL ABSCESS 2. CT-GUIDED PERCUTANEOUS DRAINAGE OF RIGHT LOWER QUADRANT PERITONEAL ABSCESS 3. CT-GUIDED PERCUTANEOUS DRAINAGE OF LEFT UPPER QUADRANT PERITONEAL ABSCESS 4. CT-GUIDED PERCUTANEOUS DRAINAGE OF LEFT PELVIC PERITONEAL ABSCESS ANESTHESIA/SEDATION: 5.0 Mg IV Versed 100 mcg IV Fentanyl Total Moderate Sedation Time:  55 minutes PROCEDURE: The procedure, risks, benefits, and alternatives were explained to the patient. Questions regarding the procedure were encouraged and answered. The patient understands and consents to the procedure. CT was performed in a supine position. The right and left abdominal walls were prepped with Betadine in a sterile fashion, and a sterile drape was applied covering the operative field. A sterile gown and sterile gloves were used for the procedure. Local anesthesia was provided with 1% Lidocaine. Under CT guidance, a 18 gauge needle was advanced into a right upper quadrant perihepatic abscess. Aspiration of fluid was performed and a sample sent for culture analysis. A guidewire was advanced into the collection. A 10 French percutaneous drainage catheter was then advanced over the wire. Catheter positioning was confirmed by CT. Under CT guidance, an 18 gauge needle was advanced into a left upper quadrant abscess. A fluid sample was aspirated and sent for culture analysis. A guidewire was advanced. A 10 French percutaneous drainage catheter was advanced over the wire. Catheter positioning was confirmed by CT. Under CT guidance, an 18 gauge needle was advanced into a right lower quadrant abscess. Fluid was aspirated and a sample sent for culture analysis. A guidewire was  placed. The tract was dilated and a 10 French percutaneous drainage catheter placed. Catheter positioning was confirmed by CT. Under CT guidance, a 5 [place] Yueh centesis catheter was inserted into abscess fluid localizing to the left pelvis. After confirming catheter positioning by CT, manual aspiration drainage was performed with syringes. The drainage catheter was slowly retracted. The catheter was then removed. The 3 percutaneous drains were secured with Prolene retention sutures and StatLock devices. All 3 drains were connected to suction bulbs. COMPLICATIONS: None FINDINGS: The right perihepatic collection yielded bloody fluid. The other collections yielded yellowish and slightly turbid fluid. The left pelvic fluid was the least turbid and appeared quite clear. 60 mL of fluid was able to be aspirated from a pocket in the left pelvis with no further return. An indwelling drain was not left in place in this region. IMPRESSION: CT-guided catheter drainage of 4 separate fluid collections in the peritoneal cavity. 10 French drains were placed in a right upper quadrant perihepatic abscess which yielded bloody fluid, a left upper quadrant abscess yielding yellowish turbid fluid and a right lower quadrant abscess also yielding slightly turbid yellowish fluid. A left pelvic fluid collection was drained with a 5 French centesis catheter yielding 60 mL of serous fluid. A drain was not left in place in this collection. Electronically Signed   By: Aletta Edouard M.D.   On: 12/08/2015 15:44   Ct Image Guided Drainage Percut Cath  Peritoneal Retroperit  12/08/2015  CLINICAL DATA:  Small bowel perforation with multiple post- operative  peritoneal fluid collections and increasing leukocytosis. EXAM: 1. CT-GUIDED PERCUTANEOUS DRAINAGE OF PERIHEPATIC PERITONEAL ABSCESS 2. CT-GUIDED PERCUTANEOUS DRAINAGE OF RIGHT LOWER QUADRANT PERITONEAL ABSCESS 3. CT-GUIDED PERCUTANEOUS DRAINAGE OF LEFT UPPER QUADRANT PERITONEAL ABSCESS 4.  CT-GUIDED PERCUTANEOUS DRAINAGE OF LEFT PELVIC PERITONEAL ABSCESS ANESTHESIA/SEDATION: 5.0 Mg IV Versed 100 mcg IV Fentanyl Total Moderate Sedation Time:  55 minutes PROCEDURE: The procedure, risks, benefits, and alternatives were explained to the patient. Questions regarding the procedure were encouraged and answered. The patient understands and consents to the procedure. CT was performed in a supine position. The right and left abdominal walls were prepped with Betadine in a sterile fashion, and a sterile drape was applied covering the operative field. A sterile gown and sterile gloves were used for the procedure. Local anesthesia was provided with 1% Lidocaine. Under CT guidance, a 18 gauge needle was advanced into a right upper quadrant perihepatic abscess. Aspiration of fluid was performed and a sample sent for culture analysis. A guidewire was advanced into the collection. A 10 French percutaneous drainage catheter was then advanced over the wire. Catheter positioning was confirmed by CT. Under CT guidance, an 18 gauge needle was advanced into a left upper quadrant abscess. A fluid sample was aspirated and sent for culture analysis. A guidewire was advanced. A 10 French percutaneous drainage catheter was advanced over the wire. Catheter positioning was confirmed by CT. Under CT guidance, an 18 gauge needle was advanced into a right lower quadrant abscess. Fluid was aspirated and a sample sent for culture analysis. A guidewire was placed. The tract was dilated and a 10 French percutaneous drainage catheter placed. Catheter positioning was confirmed by CT. Under CT guidance, a 5 [place] Yueh centesis catheter was inserted into abscess fluid localizing to the left pelvis. After confirming catheter positioning by CT, manual aspiration drainage was performed with syringes. The drainage catheter was slowly retracted. The catheter was then removed. The 3 percutaneous drains were secured with Prolene retention sutures  and StatLock devices. All 3 drains were connected to suction bulbs. COMPLICATIONS: None FINDINGS: The right perihepatic collection yielded bloody fluid. The other collections yielded yellowish and slightly turbid fluid. The left pelvic fluid was the least turbid and appeared quite clear. 60 mL of fluid was able to be aspirated from a pocket in the left pelvis with no further return. An indwelling drain was not left in place in this region. IMPRESSION: CT-guided catheter drainage of 4 separate fluid collections in the peritoneal cavity. 10 French drains were placed in a right upper quadrant perihepatic abscess which yielded bloody fluid, a left upper quadrant abscess yielding yellowish turbid fluid and a right lower quadrant abscess also yielding slightly turbid yellowish fluid. A left pelvic fluid collection was drained with a 5 French centesis catheter yielding 60 mL of serous fluid. A drain was not left in place in this collection. Electronically Signed   By: Aletta Edouard M.D.   On: 12/08/2015 15:44    Anti-infectives: Anti-infectives    Start     Dose/Rate Route Frequency Ordered Stop   12/08/15 0800  metroNIDAZOLE (FLAGYL) IVPB 500 mg     500 mg 100 mL/hr over 60 Minutes Intravenous Every 6 hours 12/08/15 0737     12/03/15 1800  vancomycin (VANCOCIN) IVPB 750 mg/150 ml premix  Status:  Discontinued     750 mg 150 mL/hr over 60 Minutes Intravenous Every 24 hours 12/03/15 1715 12/04/15 0911   12/02/15 1800  ceFEPIme (MAXIPIME) 1 g in dextrose 5 % 50  mL IVPB     1 g 100 mL/hr over 30 Minutes Intravenous Every 12 hours 12/02/15 1014 12/09/15 1759   12/02/15 0600  vancomycin (VANCOCIN) IVPB 1000 mg/200 mL premix  Status:  Discontinued     1,000 mg 200 mL/hr over 60 Minutes Intravenous 3 times per day 12/02/15 0547 12/02/15 0954   12/01/15 2200  ceFEPIme (MAXIPIME) 1 g in dextrose 5 % 50 mL IVPB  Status:  Discontinued     1 g 100 mL/hr over 30 Minutes Intravenous 3 times per day 12/01/15 1455  12/02/15 1014   11/30/15 1400  ceFEPIme (MAXIPIME) 1 g in dextrose 5 % 50 mL IVPB  Status:  Discontinued     1 g 100 mL/hr over 30 Minutes Intravenous 3 times per day 11/30/15 0913 12/01/15 1455   11/30/15 1000  metroNIDAZOLE (FLAGYL) IVPB 500 mg     500 mg 100 mL/hr over 60 Minutes Intravenous Every 8 hours 11/30/15 0858 12/07/15 0226   11/30/15 0645  vancomycin (VANCOCIN) IVPB 750 mg/150 ml premix  Status:  Discontinued     750 mg 150 mL/hr over 60 Minutes Intravenous 3 times per day 11/30/15 0630 12/02/15 0547   11/30/15 0645  imipenem-cilastatin (PRIMAXIN) 500 mg in sodium chloride 0.9 % 100 mL IVPB  Status:  Discontinued     500 mg 200 mL/hr over 30 Minutes Intravenous 3 times per day 11/30/15 0630 11/30/15 0859      Assessment/Plan: Problem List: Patient Active Problem List   Diagnosis Date Noted  . Intra-abdominal fluid collection   . S/P exploratory laparotomy   . SBO (small bowel obstruction) ([place])   . Palliative care encounter   . Deep vein thrombosis (DVT) of right upper extremity ([place]) 12/06/2015  . Acute respiratory failure with hypoxemia ([place]) 12/06/2015  . DVT of axillary vein, acute right   . Underweight 12/03/2015  . Small bowel obstruction & perforation s/p OR exploration & repair 12/01/2015 12/02/2015  . Purulent peritonitis ([place]) 12/02/2015  . Noncompliance 12/02/2015  . 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 Ischemic cardiomyopathy   . Type II diabetes mellitus ([place])   . Diabetic neuropathy ([place])   . Sepsis ([place])   . Encephalopathy   . Proteinuria 11/22/2015  . Protein-calorie malnutrition, severe ([place]) 07/30/2015  . Chest pain 11/10/2013  . Generalized weakness 10/23/2013  . Atypical chest pain 10/23/2013  . CAD (coronary artery disease) 10/23/2013  . Light headedness 07/02/2013  . Anxiety and depression 07/02/2013  . Ecchymosis 06/11/2013  . Dyspnea 06/10/2013  . Tobacco abuse 06/05/2013  . Dyslipidemia 06/05/2013  . Seizure disorder ([place]) 06/05/2013  .  Abdominal aneurysm without mention of rupture 12/17/2011  . Atherosclerosis of native artery of extremity with intermittent claudication ([place]) 10/09/2011    Will offer some clear liquids.   8 Days Post-Op    LOS: 18 days   Matt B. Hassell Done, MD, [facility] [phone] beeper [phone]  12/09/2015 8:33 AM

## 2015-12-09 NOTE — Clinical Social Work Note (Signed)
Clinical Social Work Assessment  Patient Details  Name: [name] [name] MRN: [id number] Date of Birth: 1951-03-26  Date of referral:  12/09/15               Reason for consult:                   Permission sought to share information with:  Facility Art therapist granted to share information::  Yes, Verbal Permission Granted  Name::        Agency::     Relationship::     Contact Information:     Housing/Transportation Living arrangements for the past 2 months:  Apartment Source of Information:  Patient Patient Interpreter Needed:  None Criminal Activity/Legal Involvement Pertinent to Current Situation/Hospitalization:  No - Comment as needed Significant Relationships:  Friend Lives with:  Self Do you feel safe going back to the place where you live?  No Need for family participation in patient care:  No (Coment)  Care giving concerns:  No concerns expressed at this time. Pt is agreeable to SNF placement.    Social Worker assessment / plan:  CSW met with pt at bedside; pt was pleasant and cooperative during assessment. Pt is agreeable to SNF placement and acknowledges the benefits of STR prior to returning home. Pt states he has limited family living in the area but does have friends that he considers family. At this time, pt doesn't want CSW to contact anyone. CSW will initiate SNF search in [facility] and continue to follow for placement needs.   Employment status:  Retired Forensic scientist:    PT Recommendations:  [place] / Referral to community resources:  [place]  Patient/Family's Response to care:  Pt is agreeable to SNF placement.   Patient/Family's Understanding of and Emotional Response to Diagnosis, Current Treatment, and Prognosis:  Pt appears realistic regarding his current needs/level of care. He's agreeable to SNF placement and seems intrigued by the idea of STR stating, "Well, I think that's a  really good idea."   Emotional Assessment Appearance:  Appears stated age Attitude/Demeanor/Rapport:   (Pleasant and cooperative) Affect (typically observed):  Accepting, Adaptable Orientation:  Oriented to Self, Oriented to Place, Oriented to  Time, Oriented to Situation Alcohol / Substance use:    Psych involvement (Current and /or in the community):  No (Comment)  Discharge Needs  Concerns to be addressed:  No discharge needs identified Readmission within the last 30 days:  Yes Current discharge risk:  None Barriers to Discharge:  No Barriers Identified   Linna Darner, LCSW 12/09/2015, 11:59 AM

## 2015-12-09 NOTE — Progress Notes (Signed)
PARENTERAL NUTRITION CONSULT NOTE - FOLLOW-UP  Pharmacy Consult for TPN Indication: Bowel obstruction  Allergies  Allergen Reactions  . Zetia [Ezetimibe] Anaphylaxis and Swelling    Tongue and throat   . Atorvastatin Other (See Comments)    Malaise & muscle weakness  . Penicillins Other (See Comments)    Unknown.  Tolerates cefepime  . Pravastatin Sodium     Pravastatin 40 mg qday and 40 mg q M/W/F caused muscle aches  . Lisinopril Rash  . Promethazine Rash   Patient Measurements: Height: 6\' 2"  (188 cm) Weight: 157 lb 10.1 oz (71.5 kg) IBW/kg (Calculated) : 82.2 Usual Weight: 137-140 lbs  Vital Signs: Temp: 98.5 F (36.9 C) (01/14 0410) Temp Source: Oral (01/14 0410) BP: 140/78 mmHg (01/14 0410) Pulse Rate: 72 (01/14 0410) Intake/Output from previous day: 01/13 0701 - 01/14 0700 In: 2684 [I.V.:444; IV Piggyback:500; TPN:1725] Out: 3150 [Urine:2850; Drains:300] Intake/Output from this shift:   Labs:  Recent Labs  12/06/15 0925 12/06/15 1740 12/07/15 0445 12/08/15 0340  WBC  --  14.6* 15.9* 17.6*  HGB  --  11.0* 10.0* 10.8*  HCT  --  31.8* 29.5* 31.7*  PLT  --  118* 120* 129*  APTT 45*  --   --   --   INR 1.89*  --   --  1.82*    Recent Labs  12/07/15 0445 12/08/15 0340  NA 141 137  K 4.0 3.9  CL 110 104  CO2 24 26  GLUCOSE 140* 151*  BUN 51* 52*  CREATININE 1.89* 1.68*  CALCIUM 8.4* 8.3*  MG 1.6*  --   PHOS 4.1  --   PROT 5.3*  --   ALBUMIN 1.8*  --   AST 27  --   ALT 14*  --   ALKPHOS 72  --   BILITOT 0.9  --    Estimated Creatinine Clearance: 45.5 mL/min (by C-G formula based on Cr of 1.68).    Recent Labs  12/08/15 2341 12/09/15 0414 12/09/15 0756  GLUCAP 108* 138* 107*   Medical History: Past Medical History  Diagnosis Date  . Hyperlipidemia   . Hypertension   . Leg pain   . Peripheral vascular disease ([place])     s/p prior Ao-Bifem BPG  . 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 Coronary artery disease     a. NSTEMI 7/14=> LHC 06/07/13: Proximal LAD 30%, ostial D1 30%, AV circumflex 80%, then occluded, ostial OM2 occluded, left to left collaterals, mid RCA 95%, inferior HK, EF 60%. PCI: Promus DES to the mid RCA.  No intervention was recommended for the CTO circumflex.  . Ischemic cardiomyopathy     a. Echocardiogram 06/06/13: EF 35-40%, inferior AK, mild MR.  Marland Kitchen Shortness of breath     "can come on at anytime lately; before that it was just w/exertion" (11/10/2013)  . Type II diabetes mellitus ([place])   . [place] mal seizure [facility])     "last one was ~ 3 yr ago; controlled w/daily RX" (11/10/2013)  . Arthritis     "neck, back, legs" (11/10/2013)  . Kidney stones     "when I was younger; passed w/o treatment" (11/10/2013)  . Myocardial infarction ([place])   . Diabetic neuropathy ([place])   . H. pylori infection   . Wears glasses   . NSTEMI (non-ST elevated myocardial infarction) ([place]) 06/05/2013  . Shoulder contusion 11/07/2013   Medications:  Infusions:  . Marland Kitchen  TPN (CLINIMIX-E) Adult 65 mL/hr at 12/08/15 1720   And  . fat emulsion 240 mL (12/08/15 1720)  . heparin 1,800 Units/hr (12/09/15 0005)  . lactated ringers 125 mL/hr at 12/08/15 1949   Insulin Requirements in the past 24 hours: 15 units SSI/24h + 15 units Regular insulin in TPN  Current Nutrition: NPO for abscess aspiration/drain placement, some CL  IVF: LR per MD at 125 ml/hr  Central access: PICC placed 1/3 TPN start date: 1/3  ASSESSMENT                                                                                                          HPI: 48 yoM presented to ED on 12/27 with abdominal pain.  PMH includes HTN, HLD, PVD - s/p fem bypass, CAD s/p NSTEMI 2014, cardiomyopathy, DM, diabetic peripheral neuropathy, seizures, and chronic pain on chronic opioids.  Surgical history significant for open AAA repair.  CT shows partial SBO, likely adhesions.  Patient refused surgery, until 1/6 s/p repair of  perforated visous.  Plan for conservative management with TPN, bowel rest.   Significant events:  1/1: TPN per pharmacy ordered - unable to start without central access.  1/3: PICC line placed; TPN started 1/5: Acute encephalopathy with fevers (102F) overnight- sepsis protocol initiated.  Broad-spectrum abx started.  1/6: Consented to laparotomy w/ findings of perforated viscus and peritonitis. Txfer to ICU   1/10: NGT removed 1/11: started clear liquid diet 1/13 NPO again for CT guided drain placement for perihepatic fluid collection suspicious for abscess  Today 12/09/2015:   Glucose: 1x CBG above goal of <150 mg/dL.  Insulin added to TPN. Hx DM on glipizide PTA.   Electrolytes - Mg replaced, CorrCa 10.0.  Renal - AKI; SCr trending down today.  UOP good.  I/O: -2650 mL  1.5 ml/kg/hr  Drains x3: output 140 ml (113), 160 ml already today  LFTs - AST/ALT and Tbili ok   TGs -  (Hx of HPL) 124 (1/2), 164 (1/9)  Prealbumin - 3.7 (1/2), < 2 (1/9)  NUTRITIONAL GOALS                                                                                             RD recs: 1600-1800 Kcal/day, 65-80 g protein/day, > 2 L fluid/day  Clinimix E 5/15 at a goal rate of 65 ml/hr + 20% fat emulsion at 10 ml/hr to provide: 78 g/day protein, 1588 Kcal/day.  PLAN  Continue Clinimix E 5/15 at goal rate of 65 ml/h  Insulin added to TPN so that patient receives 20 units of regular insulin over 24 hour infusion.    Continue 20% fat emulsion at 10 ml/hr  Standard MVI, MTE in TPN bag.  Continue q6h CBGs with moderate correction insulin.  TPN lab panels on Mondays & Thursdays.  BMET in AM.  Reduce IV fluid LR to 80 ml/hr  Minda Ditto PharmD Pager [phone] 12/09/2015, 8:39 AM

## 2015-12-09 NOTE — Progress Notes (Signed)
ANTICOAGULATION CONSULT NOTE - F/u Consult  Pharmacy Consult for heparin Indication: DVT  Allergies  Allergen Reactions  . Zetia [Ezetimibe] Anaphylaxis and Swelling    Tongue and throat   . Atorvastatin Other (See Comments)    Malaise & muscle weakness  . Penicillins Other (See Comments)    Unknown.  Tolerates cefepime  . Pravastatin Sodium     Pravastatin 40 mg qday and 40 mg q M/W/F caused muscle aches  . Lisinopril Rash  . Promethazine Rash   Patient Measurements: Height: 6\' 2"  (188 cm) Weight: 157 lb 10.1 oz (71.5 kg) IBW/kg (Calculated) : 82.2 Heparin Dosing Weight: 72kg  Vital Signs: Temp: 98.5 F (36.9 C) (01/14 0410) Temp Source: Oral (01/14 0410) BP: 140/78 mmHg (01/14 0410) Pulse Rate: 72 (01/14 0410)  Labs:  Recent Labs  12/06/15 0925  12/06/15 1740  12/07/15 0445  12/08/15 0340 12/08/15 2200 12/09/15 0643  HGB  --   < > 11.0*  --  10.0*  --  10.8*  --   --   HCT  --   --  31.8*  --  29.5*  --  31.7*  --   --   PLT  --   --  118*  --  120*  --  129*  --   --   APTT 45*  --   --   --   --   --   --   --   --   LABPROT 21.6*  --   --   --   --   --  21.0*  --   --   INR 1.89*  --   --   --   --   --  1.82*  --   --   HEPARINUNFRC  --   --   --   < >  --   < > 0.41 0.40 0.38  CREATININE  --   --   --   --  1.89*  --  1.68*  --   --   < > = values in this interval not displayed.  Estimated Creatinine Clearance: 45.5 mL/min (by C-G formula based on Cr of 1.68).  Assessment: 15 YOM known to pharmacy for TPN management and antibiotic dosing.  He is s/p ex lap on 1/6 and found to have perforated viscous and peritonitis.  RUE doppler 1/11 reveals DVT of RUE along PICC site.  PICC removed 1/11 and Left IJ placed.   Significant events: 1/11 baseline INR = 1.89, aPTT = 45sec 1/13 heparin held in AM for CT guided drain abscess drainage x 4 areas of fluid collection.  Heparin infusion resumed at previous rate (1800 units/hr) this afternoon at 1400 per IR  instruction. Heparin level therapeutic after resumption of Heparin (0.40)  Today, 12/09/2015   Heparin level remains therapeutic this am (0.38) on Heparin 1800 units/hr  CBC: Hgb/platelets low but stable with minimal (<10 mL) blood loss reported during IR drain placements  AKI - SCr improving, CrCl > 41ml/min, No SCr this am  Goal of Therapy:  Heparin level 0.3-0.7 units/ml Monitor platelets by anticoagulation protocol: Yes   Plan:   Continue Heparin infusion at 1800 units/hr   Daily CBC and heparin level  CBC not yet obtained this am  Minda Ditto PharmD Pager [phone] 12/09/2015, 8:15 AM

## 2015-12-09 NOTE — Consult Note (Signed)
Consultation Note Date: 12/09/2015   Patient Name: [name] [name]  DOB: [birth year]  MRN: [id number]  Age / Sex: 65 y.o., male  PCP: Marijean Bravo, MD Referring Physician: Robbie Lis, MD  Reason for Consultation: Establishing goals of care    Clinical Assessment/Narrative: Pt is a 65 yo man adm 12/27 with severe abdominal pain and found to have a SBO, adhesions vs. Narcotic bowel. Pt has a h/o chronic opioid usage he states for 4 years of approx 40 mg oxycodone q4 atc. Polysubstance abuse is referenced in the chart. Pt originally refusing PICC, surgical interventions and did appear to have some improvement until 11/30/15 when his condition deteriorated acutely. He was septic. He underwent exploratory lap on 12/01/15, and found to have a perforated bowel. Pt was intubated after surgery and then self extubated on 12-02-15. He has an open abdominal wound that required further percutaneous drainage on 12/08/15. He was transferred out of ICU to floor today. He is alert and oriented x 3. He did get out of bed to the chair. He reports is pain is very under treated. He is on oxycodone 10 mg q4 prn as well as dilaudid prn. He states neither are touching his pain.  I had called his brother on 12-08-15 to set up family meeting for [place] when pt was in ICU. His brother did not come to the appointment. Pt has clear goals to get better and is struggling with not being independent at this point in his life. He recognizes that he will need extensive help in the home or will need SNF for rehab as well as managing TNA, and wound care.   Contacts/Participants in Discussion: Primary Decision Maker: pt   Relationship to Patient n/a HCPOA: unclear Emergency contact listed as his brother who did not show up for appt  SUMMARY OF RECOMMENDATIONS Full care measures with goal to rehab, recover from abdominal wound and go home Interested in Palliative  consult only if can help with pain mgt  Code Status/Advance Care Planning: Full code    Code Status Orders        Start     Ordered   11/21/15 2213  Full code   Continuous     11/21/15 2212    Code Status History    Date Active Date Inactive Code Status Order ID Comments User Context   07/28/2015  4:58 PM 07/30/2015  5:28 PM Full Code [id number]  Gabriel Earing, PA-C Inpatient   07/26/2015 12:21 PM 07/26/2015  7:17 PM Full Code [id number]  Serafina Mitchell, MD Inpatient   11/10/2013  9:49 PM 11/11/2013  5:13 PM Full Code [id number]  Berle Mull, MD Inpatient   10/23/2013 10:28 PM 10/24/2013  7:08 PM Full Code [id number]  Theressa Millard, MD Inpatient   11/25/2011  3:00 PM 12/04/2011  3:48 PM Full Code [id number]  Raquel James, RN Inpatient   11/10/2011 10:46 PM 11/13/2011  6:33 PM Full Code [id number]  Conrad Padre Ranchitos, MD ED      Other Directives:None  Symptom Management:   Pain: Pt reports taking much higher doses of opioids for his chronic pain and now with new acute pain. He is asking for more frequent as well as more therapeutic dosing of pain medication. If he has in fact been taking this amount of opioids, his behavior would indicate pseudo addiction. Will defer at this point to surgery but PMT would be happy to help going forward if so needed  Palliative Prophylaxis:  Bowel Regimen, Delirium Protocol and Frequent Pain Assessment  Additional Recommendations (Limitations, Scope, Preferences):  Full Scope Treatment  Psycho-social/Spiritual:  Support System: Poor Desire for further Chaplaincy support:no   Prognosis: Unable to determine  Discharge Planning: [facility] for rehab with Palliative care service follow-up   Chief Complaint/ Primary Diagnoses: Present on Admission:  . Tobacco abuse . CAD (coronary artery disease) . Proteinuria . Protein-calorie malnutrition, severe ([place]) . 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 Ischemic cardiomyopathy . Diabetic  neuropathy ([place]) . (Resolved) PAD (peripheral artery disease) ([place]) . Atherosclerosis of native artery of extremity with intermittent claudication ([place]) . Dyslipidemia . Dyspnea  I have reviewed the medical record, interviewed the patient and family, and examined the patient. The following aspects are pertinent.  Past Medical History  Diagnosis Date  . Hyperlipidemia   . Hypertension   . Leg pain   . Peripheral vascular disease ([place])     s/p prior Ao-Bifem BPG  . 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 Coronary artery disease     a. NSTEMI 7/14=> LHC 06/07/13: Proximal LAD 30%, ostial D1 30%, AV circumflex 80%, then occluded, ostial OM2 occluded, left to left collaterals, mid RCA 95%, inferior HK, EF 60%. PCI: Promus DES to the mid RCA.  No intervention was recommended for the CTO circumflex.  . Ischemic cardiomyopathy     a. Echocardiogram 06/06/13: EF 35-40%, inferior AK, mild MR.  Marland Kitchen Shortness of breath     "can come on at anytime lately; before that it was just w/exertion" (11/10/2013)  . Type II diabetes mellitus ([place])   . [place] mal seizure [facility])     "last one was ~ 3 yr ago; controlled w/daily RX" (11/10/2013)  . Arthritis     "neck, back, legs" (11/10/2013)  . Kidney stones     "when I was younger; passed w/o treatment" (11/10/2013)  . Myocardial infarction ([place])   . Diabetic neuropathy ([place])   . H. pylori infection   . Wears glasses   . NSTEMI (non-ST elevated myocardial infarction) ([place]) 06/05/2013  . Shoulder contusion 11/07/2013   Social History   Social History  . Marital Status: Single    Spouse Name: N/A  . Number of Children: N/A  . Years of Education: N/A   Social History Main Topics  . Smoking status: Current Every Day Smoker -- 0.50 packs/day for 46 years    Types: Cigarettes  . Smokeless tobacco: Never Used  . Alcohol Use: 0.0 oz/week    0 Standard drinks or equivalent per week     Comment:  a12/17/20214 "last drink was 3-4 yr ago; never had problem w/it"  . Drug Use: 1.00 per week    Special: Marijuana, Cocaine     Comment: stopped May 07, 2012  . Sexual Activity: Not Currently   Other Topics Concern  . None   Social History Narrative   Family History  Problem Relation Age of Onset  . Hypertension Mother   . Heart attack Mother   . Diabetes Mother   . Aneurysm Father   . Heart attack Sister   . Heart disease Brother     heart transplant   Scheduled Meds: . antiseptic oral rinse  7 mL Mouth Rinse q12n4p  . bacitracin   Topical Daily  . ceFEPime (MAXIPIME) IV  1 g Intravenous Q12H  . chlorhexidine  15 mL Mouth Rinse BID  . collagenase  1 application Topical  Daily  . insulin aspart  0-15 Units Subcutaneous 6 times per day  . lip balm  1 application Topical BID  . metronidazole  500 mg Intravenous Q6H  . pantoprazole (PROTONIX) IV  40 mg Intravenous Q12H  . sodium chloride  3 mL Intravenous Q12H  . thiamine  100 mg Oral Daily   Or  . thiamine  100 mg Intravenous Daily   Continuous Infusions: . Marland KitchenTPN (CLINIMIX-E) Adult 65 mL/hr at 12/08/15 1720   And  . fat emulsion 240 mL (12/08/15 1720)  . Marland KitchenTPN (CLINIMIX-E) Adult     And  . fat emulsion    . heparin 1,800 Units/hr (12/09/15 1410)  . lactated ringers 80 mL/hr at 12/09/15 1000   PRN Meds:.acetaminophen, albuterol, alum & mag hydroxide-simeth, bisacodyl, diphenhydrAMINE, hydrALAZINE, HYDROmorphone (DILAUDID) injection, magic mouthwash, menthol-cetylpyridinium, oxyCODONE, phenol, sodium chloride, sodium chloride, triamcinolone cream Medications Prior to Admission:  Prior to Admission medications   Medication Sig Start Date End Date Taking? Authorizing Provider  albuterol (PROVENTIL HFA;VENTOLIN HFA) 108 (90 BASE) MCG/ACT inhaler Inhale 2 puffs into the lungs every 6 (six) hours as needed for wheezing or shortness of breath. 11/11/13  Yes Ripudeep Krystal Eaton, MD  aspirin EC 81 MG tablet Take 1 tablet (81 mg total)  by mouth daily. 11/06/15  Yes Sherren Mocha, MD  collagenase (SANTYL) ointment Apply thin layer to wound bed of left foot daily Patient taking differently: Apply 1 application topically daily. Apply thin layer to wound bed of left foot daily 10/26/15  Yes Rosetta Posner, MD  gabapentin (NEURONTIN) 300 MG capsule Take 300 mg by mouth 3 (three) times daily. 09/30/15  Yes Historical Provider, MD  glipiZIDE (GLUCOTROL) 10 MG tablet Take 10 mg by mouth 2 (two) times daily before a meal.   Yes Historical Provider, MD  ketoconazole (NIZORAL) 2 % cream Apply 1 application topically daily as needed for irritation.   Yes Historical Provider, MD  Multiple Vitamins-Minerals (MULTIVITAMIN WITH MINERALS) tablet Take 4 tablets by mouth 3 (three) times daily.   Yes Historical Provider, MD  nitroGLYCERIN (NITROSTAT) 0.4 MG SL tablet Place 0.4 mg under the tongue every 5 (five) minutes as needed for chest pain.   Yes Historical Provider, MD  oxyCODONE (ROXICODONE) 15 MG immediate release tablet Take 3 tablets (45 mg total) by mouth every 6 (six) hours as needed (severe pain). 07/30/15  Yes Kimberly A Trinh, PA-C  PATADAY 0.2 % SOLN Place 1 drop into both eyes daily.  08/11/14  Yes Historical Provider, MD  polyethylene glycol (MIRALAX / GLYCOLAX) packet Take 17 g by mouth daily as needed for mild constipation or moderate constipation.    Yes Historical Provider, MD  triamcinolone cream (KENALOG) 0.1 % Apply 1 application topically 2 (two) times daily as needed (for eczema).   Yes Historical Provider, MD  zolpidem (AMBIEN) 10 MG tablet Take 10 mg by mouth at bedtime as needed for sleep.   Yes Historical Provider, MD  silver sulfADIAZINE (SILVADENE) 1 % cream Apply 1 application topically daily. Patient not taking: Reported on 11/14/2015 07/25/15   Rosetta Posner, MD   Allergies  Allergen Reactions  . Zetia [Ezetimibe] Anaphylaxis and Swelling    Tongue and throat   . Atorvastatin Other (See Comments)    Malaise & muscle  weakness  . Penicillins Other (See Comments)    Unknown.  Tolerates cefepime  . Pravastatin Sodium     Pravastatin 40 mg qday and 40 mg q M/W/F caused muscle aches  .  Lisinopril Rash  . Promethazine Rash    Review of Systems  Constitutional: Positive for activity change, appetite change and fatigue.  HENT: Negative.   Eyes: Negative.   Respiratory: Positive for chest tightness.   Cardiovascular: Negative.   Endocrine: Negative.   Genitourinary: Negative.   Musculoskeletal:       Abdominal pain and left foot pain  Allergic/Immunologic: Negative.   Neurological: Positive for weakness.  Hematological: Negative.   Psychiatric/Behavioral: Negative.     Physical Exam  Constitutional: He is oriented to person, place, and time. He appears well-developed.  HENT:  Head: Normocephalic and atraumatic.  GI: He exhibits distension. There is tenderness.  Midline open incision with dsg covering. JP drains  Musculoskeletal: Normal range of motion.  Neurological: He is alert and oriented to person, place, and time.  Skin: Skin is warm and dry.  Psychiatric: He has a normal mood and affect.    Vital Signs: BP 135/81 mmHg  Pulse 73  Temp(Src) 98.6 F (37 C) (Oral)  Resp 16  Ht 6\' 2"  (1.88 m)  Wt 71.5 kg (157 lb 10.1 oz)  BMI 20.23 kg/m2  SpO2 98%  SpO2: SpO2: 98 % O2 Device:SpO2: 98 % O2 Flow Rate: .O2 Flow Rate (L/min): 1 L/min  IO: Intake/output summary:  Intake/Output Summary (Last 24 hours) at 12/09/15 1541 Last data filed at 12/09/15 1531  Gross per 24 hour  Intake   1750 ml  Output   2550 ml  Net   -800 ml    LBM: Last BM Date: 12/07/15 Baseline Weight: Weight: 64.2 kg (141 lb 8.6 oz) Most recent weight: Weight: 71.5 kg (157 lb 10.1 oz)      Palliative Assessment/Data:  Flowsheet Rows        Most Recent Value   Intake Tab    Referral Department  Hospitalist   Unit at Time of Referral  Med/Surg Unit   Palliative Care Primary Diagnosis  Sepsis/Infectious Disease     Date Notified  12/09/15   Palliative Care Type  New Palliative care   Reason for referral  Clarify Goals of Care   Date of Admission  11/21/15   Date first seen by Palliative Care  12/09/15   # of days Palliative referral response time  0 Day(s)   # of days IP prior to Palliative referral  18   Clinical Assessment    Pain Max last 24 hours  10   Pain Min Last 24 hours  10   Dyspnea Max Last 24 Hours  0   Dyspnea Min Last 24 hours  0   Nausea Max Last 24 Hours  0   Nausea Min Last 24 Hours  0   Anxiety Max Last 24 Hours  0   Anxiety Min Last 24 Hours  0   Other Max Last 24 Hours  0   Psychosocial & Spiritual Assessment    Palliative Care Outcomes    Patient/Family meeting held?  No      Additional Data Reviewed:  CBC:    Component Value Date/Time   WBC 17.6* 12/08/2015 0340   WBC 5.9 07/28/2009 1533   HGB 10.8* 12/08/2015 0340   HGB 12.5* 07/28/2009 1533   HCT 31.7* 12/08/2015 0340   HCT 37.1* 07/28/2009 1533   PLT 129* 12/08/2015 0340   PLT 145 07/28/2009 1533   MCV 85.4 12/08/2015 0340   MCV 93.3 07/28/2009 1533   NEUTROABS 8.0* 12/04/2015 0447   NEUTROABS 2.8 07/28/2009 1533   LYMPHSABS  1.4 12/04/2015 0447   LYMPHSABS 2.4 07/28/2009 1533   MONOABS 0.9 12/04/2015 0447   MONOABS 0.4 07/28/2009 1533   EOSABS 0.3 12/04/2015 0447   EOSABS 0.2 07/28/2009 1533   BASOSABS 0.1 12/04/2015 0447   BASOSABS 0.1 07/28/2009 1533   Comprehensive Metabolic Panel:    Component Value Date/Time   NA 137 12/08/2015 0340   K 3.9 12/08/2015 0340   CL 104 12/08/2015 0340   CO2 26 12/08/2015 0340   BUN 52* 12/08/2015 0340   CREATININE 1.68* 12/08/2015 0340   GLUCOSE 151* 12/08/2015 0340   CALCIUM 8.3* 12/08/2015 0340   AST 27 12/07/2015 0445   ALT 14* 12/07/2015 0445   ALKPHOS 72 12/07/2015 0445   BILITOT 0.9 12/07/2015 0445   PROT 5.3* 12/07/2015 0445   ALBUMIN 1.8* 12/07/2015 0445     Time In: 1515 Time Out: 1556 Time Total: 71 miin. Greater than 50%  of this  time was spent counseling and coordinating care related to the above assessment and plan.  Signed by: Dory Horn, NP  Dory Horn, NP  12/09/2015, 3:41 PM  Please contact Palliative Medicine Team phone at [phone] for questions and concerns.

## 2015-12-09 NOTE — Progress Notes (Addendum)
Patient ID: [name] [name], male   DOB: 21-Apr-1951, 65 y.o.   MRN: [id number] [name] PROGRESS NOTE  TOMMASO KER [id number] DOB: 1951-06-10 DOA: 11/21/2015 PCP: Marijean Bravo, MD  Brief narrative:    65 year old male with a past medical history of hyperlipidemia, hypertension, CAD, type 2 diabetes, diabetic peripheral neuropathy, seizure disorder, polysubstance abuse and chronic pain issues (on high doses of oxycodone prior to admission) who was admitted 11/22/15 with a small bowel obstruction. Surgical consultation was obtained on admission with conservative therapy initially recommended. Patient's bowel obstruction was felt to be from adhesions and possible narcotic bowel. His NG tube was removed 11/23/15 and he seemed to be improving clinically but pain returned 11/24/15 with diet advancement to clears. Repeat films showed worsening small bowel dilatation so his NG tube was reinserted. TPN was subsequently ordered. On 11/27/15, the patient refused PICC line insertion for TPN and surgical intervention. PICC finally placed 11/28/15 with initiation of TPN.   Further, patient has developed a right upper extremity DVT and currently is on IV heparin.  On 11/30/15, the patient's condition deteriorated acutely and he was found to be confused and restless. Rapid response was called. He was febrile and tachycardic with an elevated lactate level. Due to concerns for sepsis, broad-spectrum antibiotics were initiated with vancomycin and Primaxin which was switched to cefepime given his seizure history. Patient uderwent exploratory laparotomy on 12/01/15 with findings of perforated viscus with peritoneal contamination/peritonitis. He remained intubated postoperatively and was under the care of the critical care team. He self extubated 12/02/15. Surgery continues to follow, patient has an open abdominal wound. He continues to have an ileus and is on TNA at this time. [name] assumed care  12/05/15.  Transferred to telemetry floor 12/08/2015.  Assessment/Plan:    Principal problem: SBO w/ perforated viscous and resultant peritonitis / leukocytosis / sepsis - Secondary to adhesions versus narcotic bowel. Patient initially declined surgical intervention but then since pain did not improve he finally underwent exploratory laparotomy 12/01/2015 - Appreciate surgery following  - Currently on TPN for nutritional support  - Since his white blood cell count is rising and latest CT scan of abdomen and pelvis demonstrated moderate loculated perihepatic/perisplenic/pelvic ascites and surgery requested interventional radiology consultation for CT-guided drainage of perihepatic fluid collection appreciate interventional radiology seen the patient in consultation. - Pt now has drain from fluid collection from right perihepatic site. Drain is also in the right lower quadrant and drain also placed in left upper quadrant. There was also fluid collection in the left pelvic area and 60 mL fluid was withdrawn from the catheter but no drain was placed. - Continue current antibiotics, cefepime and Flagyl.  Active problems:  Severe Protein calorie malnutrition / underweight - In the context of acute on chronic illness, small bowel obstruction and perforated viscus - Continue TPN for nutritional support  Acute upper extremity DVT - US DVT right distal axillary vein, subclavian vein.  - Continue heparin drip  - Please watch for any signs of bleeding. This morning nurses reported that patient spit up some blood. Keep an eye on this since patient is on heparin drip  Post operative respiratory failure   - Patient required ventilatory support after exploratory laparotomy 12/01/2015.  - Patient self extubated 12/02/2015 - Worsening hypoxemia follow-up to be in the setting of pulmonary edema. Chest x-ray at that time showed pulmonary interstitial infiltrates - Respiratory status has improved with  Lasix 80 mg IV given 12/05/2015 - Respiratory status  is stable  Acute Encephalopathy in setting of sepsis / history of seizure disorder - EEG done 11/30/2015 showed no acute seizures - If seizures occur per pulmonary load with Keppra  Acute kidney injury - In the setting of sepsis and small bowel obstruction with perforated viscus - Creatinine improving - Check BMP tomorrow morning  Anemia of chronic disease / thrombocytopenia - Related to history of substance and alcohol use  - Blood count stable - Check CBC tomorrow morning  Diabetes mellitus type 2 with peripheral circulatory complications without long-term insulin use - Currently on sliding scale insulin  - CBG's in past 24 hours: 138, 107, 116  Left lower extremity wound / S/p left fem pop bypass w/ non-healing ulcer - Wound care has seen him in consultation, appreciate their assessment  DVT Prophylaxis  - SCD's bilaterally in hospital   Code Status: Full.  Family Communication:  plan of care discussed with the patient, family not at the bedside this am Disposition Plan: unable to predict when pt can be discharged, acutely ill   IV access:  Central line   Procedures and diagnostic studies from 12/05/2015:  Ct Abdomen Pelvis Wo Contrast 12/07/2015   1. There is small bilateral pleural effusion right greater than left. Significant atelectasis or infiltrate in right lower lobe posteriorly. There is superimposed mild interstitial prominence bilateral lower lobes probable mild interstitial edema. 2. There is moderate perihepatic and perisplenic ascites. Moderate ascites noted bilateral paracolic gutters. Please note there is some loculation of perihepatic ascites with mild mass effect on the liver contour please see axial images 32 and 15. Peritoneal inflammation or infection cannot be excluded. 3. No hydronephrosis or hydroureter.  Stable bilateral renal cysts. 4. Postsurgical changes are noted anterior abdominal wall. Incomplete  healed/open subcutaneous midline anterior abdominal wound. 5. Mild distended small bowel loops containing oral contrast material and some air-fluid levels. There is no transition point in caliber of small bowel. Findings most likely due to significant ileus or residual partial small bowel obstruction. There is some oral contrast material noted within distal colon. 6. Moderate pelvic ascites. Moderate gas noted within mid sigmoid colon probable mild ileus. 7. Significant anasarca infiltration of subcutaneous fat abdominal and pelvic wall. 8. 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 Electronically Signed   By: Lahoma Crocker M.D.   On: 12/07/2015 15:18   Dg Chest Port 1 View 12/06/2015  Stable bilateral pulmonary edema, with right basilar subsegmental atelectasis and associated pleural effusion. Interval placement of left internal jugular catheter with distal tip in SVC. No pneumothorax is noted. Electronically Signed   By: Marijo Conception, M.D.   On: 12/06/2015 16:10   Dg Chest Port 1 View  12/05/2015   Worsening of pulmonary interstitial infiltrates bilaterally. This may reflect pulmonary edema of cardiac or noncardiac cause or could reflect pneumonia. There are small bilateral pleural effusions. Electronically Signed   By: David  Martinique M.D.   On: 12/05/2015 08:52   Exploratory laparotomy on 12/01/15   Medical Consultants:  Surgery  CCM transfer care to [facility] PCT 12/08/2015  Other Consultants:  Nutrition PT  IAnti-Infectives:   Flagyl 11-30-2015 --> Cefepime 12-02-2015 -->   Leisa Lenz, MD  [name] Pager [phone]  Time spent in minutes: 25 minutes  If 7PM-7AM, please contact night-coverage [email or web address] Password [facility] 12/09/2015, 11:58 AM   LOS: 18 days    HPI/Subjective: No acute overnight events. Patient reports he feels better.  Objective: Filed Vitals:   12/08/15 1802 12/08/15  1900 12/08/15 1956 12/09/15 0410  BP:  159/98 141/89 140/78  Pulse: 79  94 79 72  Temp:   98.2 F (36.8 C) 98.5 F (36.9 C)  TempSrc:   Oral Oral  Resp: 18 16 17 16   Height:      Weight:      SpO2: 97% 93% 93% 97%    Intake/Output Summary (Last 24 hours) at 12/09/15 1158 Last data filed at 12/09/15 1004  Gross per 24 hour  Intake   2208 ml  Output   2550 ml  Net   -342 ml    Exam:   General:  Pt is alert, awake, no acute distress  Cardiovascular: Rate controlled, S1/S2 appreciated  Respiratory: Diminished breath sounds but no wheezing  Abdomen: Has 3 JP drains, 2 on right side and one on left, tender in mid abdomen   Extremities: No leg swelling, palpable pulses; unaboot on left leg  Neuro: Nonfocal  Data Reviewed: Basic Metabolic Panel:  Recent Labs Lab 12/03/15 0545 12/04/15 0447 12/05/15 0430 12/06/15 0610 12/07/15 0445 12/08/15 0340  NA 141 141 142 140 141 137  K 3.8 4.4 4.4 4.1 4.0 3.9  CL 112* 115* 116* 108 110 104  CO2 21* 22 22 24 24 26   GLUCOSE 154* 161* 165* 134* 140* 151*  BUN 39* 42* 41* 49* 51* 52*  CREATININE 1.89* 1.88* 1.92* 1.92* 1.89* 1.68*  CALCIUM 7.8* 8.4* 8.6* 8.5* 8.4* 8.3*  MG 1.6* 2.0 1.8 1.8 1.6*  --   PHOS 1.9* 3.4 3.4  --  4.1  --    Liver Function Tests:  Recent Labs Lab 12/04/15 0447 12/07/15 0445  AST 30 27  ALT 15* 14*  ALKPHOS 66 72  BILITOT 0.5 0.9  PROT 4.6* 5.3*  ALBUMIN 1.9* 1.8*   No results for input(s): LIPASE, AMYLASE in the last 168 hours. No results for input(s): AMMONIA in the last 168 hours. CBC:  Recent Labs Lab 12/04/15 0447 12/06/15 0610 12/06/15 1740 12/07/15 0445 12/08/15 0340  WBC 10.7* 15.2* 14.6* 15.9* 17.6*  NEUTROABS 8.0*  --   --   --   --   HGB 11.0* 11.1* 11.0* 10.0* 10.8*  HCT 32.8* 32.4* 31.8* 29.5* 31.7*  MCV 87.5 84.6 84.6 85.0 85.4  PLT 133* 110* 118* 120* 129*   Cardiac Enzymes: No results for input(s): CKTOTAL, CKMB, CKMBINDEX, TROPONINI in the last 168 hours. BNP: Invalid input(s): POCBNP CBG:  Recent Labs Lab 12/08/15 2008  12/08/15 2341 12/09/15 0414 12/09/15 0756 12/09/15 1104  GLUCAP 125* 108* 138* 107* 116*    Recent Results (from the past 240 hour(s))  Culture, Urine     Status: None   Collection Time: 11/30/15  6:21 AM  Result Value Ref Range Status   Specimen Description URINE, CLEAN CATCH  Final   Special Requests NONE  Final   Culture   Final    NO GROWTH 1 DAY Performed at [facility]    Report Status 12/01/2015 FINAL  Final  Culture, blood (Routine X 2) w Reflex to ID Panel     Status: None   Collection Time: 11/30/15  6:31 AM  Result Value Ref Range Status   Specimen Description BLOOD LEFT ARM  Final   Special Requests IN PEDIATRIC BOTTLE [place]  Final   Culture   Final    NO GROWTH 5 DAYS Performed at [facility]    Report Status 12/05/2015 FINAL  Final  Culture, blood (Routine X 2) w Reflex  to ID Panel     Status: None   Collection Time: 11/30/15  6:45 AM  Result Value Ref Range Status   Specimen Description BLOOD RIGHT PICC LINE  Final   Special Requests BOTTLES DRAWN AEROBIC AND ANAEROBIC 10CC  Final   Culture   Final    NO GROWTH 5 DAYS Performed at [facility]    Report Status 12/05/2015 FINAL  Final  MRSA PCR Screening     Status: None   Collection Time: 12/04/15  9:57 AM  Result Value Ref Range Status   MRSA by PCR NEGATIVE NEGATIVE Final    Comment:        The GeneXpert MRSA Assay (FDA approved for NASAL specimens only), is one component of a comprehensive MRSA colonization surveillance program. It is not intended to diagnose MRSA infection nor to guide or monitor treatment for MRSA infections.   Culture, routine-abscess     Status: None (Preliminary result)   Collection Time: 12/08/15 12:41 PM  Result Value Ref Range Status   Specimen Description ABSCESS RLQ  Final   Special Requests NONE  Final   Gram Stain   Final    FEW WBC PRESENT,BOTH PMN AND MONONUCLEAR NO SQUAMOUS EPITHELIAL CELLS SEEN NO ORGANISMS SEEN Performed at [facility]    Culture   Final    NO GROWTH 1 DAY Performed at [facility]    Report Status PENDING  Incomplete  Culture, routine-abscess     Status: None (Preliminary result)   Collection Time: 12/08/15 12:41 PM  Result Value Ref Range Status   Specimen Description ABSCESS PERIHEPATIC RUQ   Final   Special Requests NONE  Final   Gram Stain   Final    FEW WBC PRESENT, PREDOMINANTLY MONONUCLEAR NO SQUAMOUS EPITHELIAL CELLS SEEN NO ORGANISMS SEEN Performed at [facility]    Culture   Final    NO GROWTH 1 DAY Performed at [facility]    Report Status PENDING  Incomplete  Culture, routine-abscess     Status: None (Preliminary result)   Collection Time: 12/08/15 12:41 PM  Result Value Ref Range Status   Specimen Description ABSCESS LUQ  Final   Special Requests NONE  Final   Gram Stain   Final    ABUNDANT WBC PRESENT,BOTH PMN AND MONONUCLEAR NO SQUAMOUS EPITHELIAL CELLS SEEN NO ORGANISMS SEEN Performed at [facility]    Culture   Final    NO GROWTH 1 DAY Performed at [facility]    Report Status PENDING  Incomplete     Scheduled Meds: . antiseptic oral rinse  7 mL Mouth Rinse q12n4p  . bacitracin   Topical Daily  . ceFEPime (MAXIPIME) IV  1 g Intravenous Q12H  . chlorhexidine  15 mL Mouth Rinse BID  . collagenase  1 application Topical Daily  . insulin aspart  0-15 Units Subcutaneous 6 times per day  . lip balm  1 application Topical BID  . metronidazole  500 mg Intravenous Q6H  . pantoprazole (PROTONIX) IV  40 mg Intravenous Q12H  . sodium chloride  3 mL Intravenous Q12H  . thiamine  100 mg Oral Daily   Or  . thiamine  100 mg Intravenous Daily   Continuous Infusions: . Marland KitchenTPN (CLINIMIX-E) Adult 65 mL/hr at 12/08/15 1720   And  . fat emulsion 240 mL (12/08/15 1720)  . Marland KitchenTPN (CLINIMIX-E) Adult     And  . fat emulsion    . heparin  1,800 Units/hr (12/09/15 0005)  . lactated ringers 80 mL/hr at 12/09/15 1000

## 2015-12-09 NOTE — NC FL2 (Signed)
[place] LEVEL OF CARE SCREENING TOOL     IDENTIFICATION  Patient Name: [name] [name] Birthdate: September 03, 1951 Sex: male Admission Date (Current Location): 11/21/2015  [facility] and [place] Number:  Herbalist and Address:  [facility],  [place] [address], [place]      Provider Number: [id number]  Attending Physician Name and Address:  Robbie Lis, MD  Relative Name and Phone Number:       Current Level of Care: Hospital Recommended Level of Care: [place] Prior Approval Number:    Date Approved/Denied: 12/09/15 PASRR Number: [id number]  Discharge Plan: SNF    Current Diagnoses: Patient Active Problem List   Diagnosis Date Noted  . Intra-abdominal fluid collection   . S/P exploratory laparotomy   . SBO (small bowel obstruction) ([place])   . Palliative care encounter   . Deep vein thrombosis (DVT) of right upper extremity ([place]) 12/06/2015  . Acute respiratory failure with hypoxemia ([place]) 12/06/2015  . DVT of axillary vein, acute right   . Underweight 12/03/2015  . Small bowel obstruction & perforation s/p OR exploration & repair 12/01/2015 12/02/2015  . Purulent peritonitis ([place]) 12/02/2015  . Noncompliance 12/02/2015  . 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 Ischemic cardiomyopathy   . Type II diabetes mellitus ([place])   . Diabetic neuropathy ([place])   . Sepsis ([place])   . Encephalopathy   . Proteinuria 11/22/2015  . Protein-calorie malnutrition, severe ([place]) 07/30/2015  . Chest pain 11/10/2013  . Generalized weakness 10/23/2013  . Atypical chest pain 10/23/2013  . CAD (coronary artery disease) 10/23/2013  . Light headedness 07/02/2013  . Anxiety and depression 07/02/2013  . Ecchymosis 06/11/2013  . Dyspnea 06/10/2013  . Tobacco abuse 06/05/2013  . Dyslipidemia 06/05/2013  . Seizure disorder ([place]) 06/05/2013  . Abdominal aneurysm without mention of rupture 12/17/2011  . Atherosclerosis of native artery of  extremity with intermittent claudication ([place]) 10/09/2011    Orientation RESPIRATION BLADDER Height & Weight    Self, Time, Situation, Place  O2 (L UKN) Continent   157 lbs.  BEHAVIORAL SYMPTOMS/MOOD NEUROLOGICAL BOWEL NUTRITION STATUS      Continent Diet (TNA;NPO)  AMBULATORY STATUS COMMUNICATION OF NEEDS Skin   Extensive Assist Verbally Other (Comment) (Diabetic ulcer- Left toe)                       Personal Care Assistance Level of Assistance  Bathing, Feeding, Dressing Bathing Assistance: Maximum assistance Feeding assistance: Limited assistance Dressing Assistance: Maximum assistance     Functional Limitations Info             SPECIAL CARE FACTORS FREQUENCY  PT (By licensed PT), OT (By licensed OT)     PT Frequency: 5 X weekly OT Frequency: 5 X weekly            Contractures Contractures Info: Not present    Additional Factors Info  Insulin Sliding Scale, Allergies, Code Status Code Status Info: FULL Allergies Info: Zetia, Atorvastatin, Penicillins, Pravatatin Sodium, Lisinopril, Promethazine   Insulin Sliding Scale Info: 0-15 Units, 6 X per day       Current Medications (12/09/2015):  This is the current hospital active medication list Current Facility-Administered Medications  Medication Dose Route Frequency Provider Last Rate Last Dose  . acetaminophen (TYLENOL) suppository 650 mg  650 mg Rectal Q6H PRN Michael Boston, MD      . albuterol (PROVENTIL) (2.5 MG/3ML) 0.083% nebulizer solution 2.5 mg  2.5  mg Inhalation Q6H PRN Florencia Reasons, MD      . alum & mag hydroxide-simeth (MAALOX/MYLANTA) 200-200-20 MG/5ML suspension 30 mL  30 mL Oral Q6H PRN Michael Boston, MD      . antiseptic oral rinse (CPC / CETYLPYRIDINIUM CHLORIDE 0.05%) solution 7 mL  7 mL Mouth Rinse q12n4p Kara Mead V, MD   7 mL at 12/09/15 1119  . bacitracin ointment   Topical Daily Florencia Reasons, MD      . bisacodyl (DULCOLAX) suppository 10 mg  10 mg Rectal Q12H PRN Michael Boston, MD      .  ceFEPIme (MAXIPIME) 1 g in dextrose 5 % 50 mL IVPB  1 g Intravenous Q12H Michael Boston, MD   1 g at 12/09/15 0503  . chlorhexidine (PERIDEX) 0.12 % solution 15 mL  15 mL Mouth Rinse BID Kara Mead V, MD   15 mL at 12/09/15 1114  . collagenase (SANTYL) ointment 1 application  1 application Topical Daily Florencia Reasons, MD   1 application at [id number]  . diphenhydrAMINE (BENADRYL) injection 12.5-25 mg  12.5-25 mg Intravenous Q6H PRN Michael Boston, MD   25 mg at 12/09/15 0753  . TPN (CLINIMIX-E) Adult   Intravenous Continuous TPN Dara Hoyer, RPH 65 mL/hr at 12/08/15 1720     And  . fat emulsion 20 % infusion 240 mL  240 mL Intravenous Continuous TPN Dara Hoyer, RPH 10 mL/hr at 12/08/15 1720 240 mL at 12/08/15 1720  . TPN (CLINIMIX-E) Adult   Intravenous Continuous TPN Minda Ditto, RPH       And  . fat emulsion 20 % infusion 240 mL  240 mL Intravenous Continuous TPN Minda Ditto, RPH      . heparin ADULT infusion 100 units/mL (25000 units/250 mL)  1,800 Units/hr Intravenous Continuous Adrian Saran, RPH 18 mL/hr at 12/09/15 0005 1,800 Units/hr at 12/09/15 0005  . hydrALAZINE (APRESOLINE) injection 10-20 mg  10-20 mg Intravenous Q6H PRN Donita Brooks, NP   20 mg at 12/04/15 1823  . HYDROmorphone (DILAUDID) injection 0.5-2 mg  0.5-2 mg Intravenous Q2H PRN Michael Boston, MD   2 mg at 12/09/15 1116  . insulin aspart (novoLOG) injection 0-15 Units  0-15 Units Subcutaneous 6 times per day Chesley Mires, MD   2 Units at 12/09/15 0422  . lactated ringers infusion   Intravenous Continuous Minda Ditto, RPH 80 mL/hr at 12/09/15 1000    . lip balm (CARMEX) ointment 1 application  1 application Topical BID Michael Boston, MD   1 application at [id number]  . magic mouthwash  15 mL Oral QID PRN Michael Boston, MD      . menthol-cetylpyridinium (CEPACOL) lozenge 3 mg  1 lozenge Oral PRN Michael Boston, MD      . metroNIDAZOLE (FLAGYL) IVPB 500 mg  500 mg Intravenous Q6H Emina Riebock, NP   500 mg at 12/09/15  0755  . oxyCODONE (Oxy IR/ROXICODONE) immediate release tablet 10 mg  10 mg Oral Q4H PRN Robbie Lis, MD   10 mg at 12/09/15 0754  . pantoprazole (PROTONIX) injection 40 mg  40 mg Intravenous Q12H Saverio Danker, PA-C   40 mg at 12/09/15 1000  . phenol (CHLORASEPTIC) mouth spray 2 spray  2 spray Mouth/Throat PRN Michael Boston, MD      . sodium chloride 0.9 % injection 10-40 mL  10-40 mL Intracatheter PRN Florencia Reasons, MD   10 mL at 12/01/15 0315  . sodium chloride 0.9 %  injection 10-40 mL  10-40 mL Intracatheter PRN Robbie Lis, MD      . sodium chloride 0.9 % injection 3 mL  3 mL Intravenous Q12H Reubin Milan, MD   3 mL at 12/08/15 2200  . thiamine (VITAMIN B-1) tablet 100 mg  100 mg Oral Daily Michael Boston, MD   100 mg at 12/09/15 1000   Or  . thiamine (B-1) injection 100 mg  100 mg Intravenous Daily Michael Boston, MD   100 mg at 12/08/15 0908  . triamcinolone cream (KENALOG) 0.1 % 1 application  1 application Topical BID PRN Florencia Reasons, MD         Discharge Medications: Please see discharge summary for a list of discharge medications.  Relevant Imaging Results:  Relevant Lab Results:   Additional Information SSN: [id number]  Linna Darner, LCSW

## 2015-12-10 LAB — CBC
HEMATOCRIT: 25.3 % — AB (ref 39.0–52.0)
HEMOGLOBIN: 8.4 g/dL — AB (ref 13.0–17.0)
MCH: 28.7 pg (ref 26.0–34.0)
MCHC: 33.2 g/dL (ref 30.0–36.0)
MCV: 86.3 fL (ref 78.0–100.0)
Platelets: 254 10*3/uL (ref 150–400)
RBC: 2.93 MIL/uL — ABNORMAL LOW (ref 4.22–5.81)
RDW: 14.7 % (ref 11.5–15.5)
WBC: 14.4 10*3/uL — AB (ref 4.0–10.5)

## 2015-12-10 LAB — BASIC METABOLIC PANEL
ANION GAP: 8 (ref 5–15)
BUN: 40 mg/dL — ABNORMAL HIGH (ref 6–20)
CHLORIDE: 101 mmol/L (ref 101–111)
CO2: 29 mmol/L (ref 22–32)
CREATININE: 1.3 mg/dL — AB (ref 0.61–1.24)
Calcium: 8.3 mg/dL — ABNORMAL LOW (ref 8.9–10.3)
GFR calc non Af Amer: 57 mL/min — ABNORMAL LOW (ref >60)
Glucose, Bld: 116 mg/dL — ABNORMAL HIGH (ref 65–99)
POTASSIUM: 3.6 mmol/L (ref 3.5–5.1)
SODIUM: 138 mmol/L (ref 135–145)

## 2015-12-10 LAB — GLUCOSE, CAPILLARY
GLUCOSE-CAPILLARY: 110 mg/dL — AB (ref 65–99)
GLUCOSE-CAPILLARY: 129 mg/dL — AB (ref 65–99)
GLUCOSE-CAPILLARY: 90 mg/dL (ref 65–99)
GLUCOSE-CAPILLARY: 98 mg/dL (ref 65–99)
GLUCOSE-CAPILLARY: 98 mg/dL (ref 65–99)
Glucose-Capillary: 100 mg/dL — ABNORMAL HIGH (ref 65–99)

## 2015-12-10 LAB — CULTURE, ROUTINE-ABSCESS
CULTURE: NO GROWTH
CULTURE: NO GROWTH
Culture: NO GROWTH

## 2015-12-10 LAB — HEPARIN LEVEL (UNFRACTIONATED)
Heparin Unfractionated: 0.22 IU/mL — ABNORMAL LOW (ref 0.30–0.70)
Heparin Unfractionated: <0.1 IU/mL — ABNORMAL LOW (ref 0.30–0.70)

## 2015-12-10 MED ORDER — KETOROLAC TROMETHAMINE 30 MG/ML IJ SOLN
30.0000 mg | Freq: Four times a day (QID) | INTRAMUSCULAR | Status: AC | PRN
  Administered 2015-12-10 – 2015-12-14 (×11): 30 mg via INTRAVENOUS
  Filled 2015-12-10 (×11): qty 1

## 2015-12-10 MED ORDER — HEPARIN (PORCINE) IN NACL 100-0.45 UNIT/ML-% IJ SOLN
2400.0000 [IU]/h | INTRAMUSCULAR | Status: DC
  Administered 2015-12-11 – 2015-12-15 (×10): 2500 [IU]/h via INTRAVENOUS
  Administered 2015-12-16 – 2015-12-18 (×5): 2400 [IU]/h via INTRAVENOUS
  Filled 2015-12-10 (×23): qty 250

## 2015-12-10 MED ORDER — TRACE MINERALS CR-CU-MN-SE-ZN 10-1000-500-60 MCG/ML IV SOLN
INTRAVENOUS | Status: AC
  Administered 2015-12-10: 17:00:00 via INTRAVENOUS
  Filled 2015-12-10: qty 1560

## 2015-12-10 MED ORDER — LORAZEPAM 2 MG/ML IJ SOLN
1.0000 mg | Freq: Four times a day (QID) | INTRAMUSCULAR | Status: DC | PRN
  Administered 2015-12-10: 1 mg via INTRAVENOUS
  Filled 2015-12-10: qty 1

## 2015-12-10 MED ORDER — FAT EMULSION 20 % IV EMUL
240.0000 mL | INTRAVENOUS | Status: AC
  Administered 2015-12-10: 240 mL via INTRAVENOUS
  Filled 2015-12-10: qty 250

## 2015-12-10 NOTE — Progress Notes (Signed)
PARENTERAL NUTRITION CONSULT NOTE - FOLLOW-UP  Pharmacy Consult for TPN Indication: Bowel obstruction  Allergies  Allergen Reactions  . Zetia [Ezetimibe] Anaphylaxis and Swelling    Tongue and throat   . Atorvastatin Other (See Comments)    Malaise & muscle weakness  . Penicillins Other (See Comments)    Unknown.  Tolerates cefepime  . Pravastatin Sodium     Pravastatin 40 mg qday and 40 mg q M/W/F caused muscle aches  . Lisinopril Rash  . Promethazine Rash   Patient Measurements: Height: 6\' 2"  (188 cm) Weight: 157 lb 10.1 oz (71.5 kg) IBW/kg (Calculated) : 82.2 Usual Weight: 137-140 lbs  Vital Signs: Temp: 99 F (37.2 C) (01/15 0603) Temp Source: Oral (01/15 0603) BP: 142/80 mmHg (01/15 0603) Pulse Rate: 78 (01/15 0603) Intake/Output from previous day: 01/14 0701 - 01/15 0700 In: 2251 [I.V.:1221; TPN:900] Out: 1615 [Urine:1325; Drains:290] Intake/Output from this shift: Total I/O In: -  Out: 350 [Urine:350] Labs:  Recent Labs  12/08/15 0340  WBC 17.6*  HGB 10.8*  HCT 31.7*  PLT 129*  INR 1.82*    Recent Labs  12/08/15 0340  NA 137  K 3.9  CL 104  CO2 26  GLUCOSE 151*  BUN 52*  CREATININE 1.68*  CALCIUM 8.3*   Estimated Creatinine Clearance: 45.5 mL/min (by C-G formula based on Cr of 1.68).    Recent Labs  12/09/15 2358 12/10/15 0403 12/10/15 0746  GLUCAP 90 98 129*   Medical History: Past Medical History  Diagnosis Date  . Hyperlipidemia   . Hypertension   . Leg pain   . Peripheral vascular disease ([place])     s/p prior Ao-Bifem BPG  . 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 Coronary artery disease     a. NSTEMI 7/14=> LHC 06/07/13: Proximal LAD 30%, ostial D1 30%, AV circumflex 80%, then occluded, ostial OM2 occluded, left to left collaterals, mid RCA 95%, inferior HK, EF 60%. PCI: Promus DES to the mid RCA.  No intervention was recommended for the CTO circumflex.  . Ischemic  cardiomyopathy     a. Echocardiogram 06/06/13: EF 35-40%, inferior AK, mild MR.  Marland Kitchen Shortness of breath     "can come on at anytime lately; before that it was just w/exertion" (11/10/2013)  . Type II diabetes mellitus ([place])   . [place] mal seizure [facility])     "last one was ~ 3 yr ago; controlled w/daily RX" (11/10/2013)  . Arthritis     "neck, back, legs" (11/10/2013)  . Kidney stones     "when I was younger; passed w/o treatment" (11/10/2013)  . Myocardial infarction ([place])   . Diabetic neuropathy ([place])   . H. pylori infection   . Wears glasses   . NSTEMI (non-ST elevated myocardial infarction) ( [place]) 06/05/2013  . Shoulder contusion 11/07/2013   Medications:  Infusions:  . Marland KitchenTPN (CLINIMIX-E) Adult 65 mL/hr at 12/09/15 1751   And  . fat emulsion 240 mL (12/09/15 1751)  . heparin 1,800 Units/hr (12/10/15 0447)  . lactated ringers 80 mL/hr at 12/09/15 1000   Insulin Requirements in the past 24 hours: 7 units SSI/24h + 20 units Regular insulin in TPN  Current Nutrition: NPO for abscess aspiration/drain placement, some CL  IVF: LR 80 ml/hr  Central access: PICC placed 1/3 TPN start date: 1/3  ASSESSMENT  HPI: 75 yoM presented to ED on 12/27 with abdominal pain.  PMH includes HTN, HLD, PVD - s/p fem bypass, CAD s/p NSTEMI 2014, cardiomyopathy, DM, diabetic peripheral neuropathy, seizures, and chronic pain on chronic opioids.  Surgical history significant for open AAA repair.  CT shows partial SBO, likely adhesions.  Patient refused surgery, until 1/6 s/p repair of perforated visous.  Plan for conservative management with TPN, bowel rest.   Significant events:  1/1: TPN per pharmacy ordered - unable to start without central access.  1/3: PICC line placed; TPN started 1/5: Acute encephalopathy with fevers (102F) overnight- sepsis protocol initiated.  Broad-spectrum abx started.  1/6:  Consented to laparotomy w/ findings of perforated viscus and peritonitis. Txfer to ICU   1/10: NGT removed 1/11: started clear liquid diet 1/13 NPO again for CT guided drain placement for perihepatic fluid collection suspicious for abscess. FL diet post-procedure  Today 12/10/2015:   Glucose: <150 mg/dL.  Insulin added to TPN. Hx DM on glipizide PTA.   Electrolytes - Mg replaced, CorrCa 10.0.  Renal - AKI; SCr trending down.  UOP decreased, 1.2L, 0.7 ml/kg/hr  I/O: + 1820 mL    Drains x3: output 390 ml (1/14)  LFTs - AST/ALT and Tbili ok   TGs -  (Hx of HPL) 124 (1/2), 164 (1/9)  Prealbumin - 3.7 (1/2), < 2 (1/9)  NUTRITIONAL GOALS                                                                                             RD recs: 1600-1800 Kcal/day, 65-80 g protein/day, > 2 L fluid/day  Clinimix E 5/15 at a goal rate of 65 ml/hr + 20% fat emulsion at 10 ml/hr to provide: 78 g/day protein, 1588 Kcal/day.  PLAN                                                                                                                          Continue Clinimix E 5/15 at goal rate of 65 ml/h  Insulin added to TPN so that patient receives 20 units of regular insulin over 24 hour infusion.    Continue 20% fat emulsion at 10 ml/hr  Standard MVI, MTE in TPN bag.  Continue q6h CBGs with moderate correction insulin.  TPN lab panels on Mondays & Thursdays.  BMET in AM.  Minda Ditto PharmD Pager [phone] 12/10/2015, 10:12 AM

## 2015-12-10 NOTE — Progress Notes (Signed)
ANTICOAGULATION CONSULT NOTE - F/u Consult  Pharmacy Consult for heparin Indication: DVT  Allergies  Allergen Reactions  . Zetia [Ezetimibe] Anaphylaxis and Swelling    Tongue and throat   . Atorvastatin Other (See Comments)    Malaise & muscle weakness  . Penicillins Other (See Comments)    Unknown.  Tolerates cefepime  . Pravastatin Sodium     Pravastatin 40 mg qday and 40 mg q M/W/F caused muscle aches  . Lisinopril Rash  . Promethazine Rash   Patient Measurements: Height: 6\' 2"  (188 cm) Weight: 157 lb 10.1 oz (71.5 kg) IBW/kg (Calculated) : 82.2 Heparin Dosing Weight: 72kg  Vital Signs: Temp: 98.3 F (36.8 C) (01/15 2014) Temp Source: Oral (01/15 2014) BP: 155/85 mmHg (01/15 2014) Pulse Rate: 70 (01/15 2014)  Labs:  Recent Labs  12/08/15 0340  12/09/15 0643 12/10/15 1310 12/10/15 2055  HGB 10.8*  --   --  8.4*  --   HCT 31.7*  --   --  25.3*  --   PLT 129*  --   --  254  --   LABPROT 21.0*  --   --   --   --   INR 1.82*  --   --   --   --   HEPARINUNFRC 0.41  < > 0.38 0.22* <0.10*  CREATININE 1.68*  --   --  1.30*  --   < > = values in this interval not displayed.  Estimated Creatinine Clearance: 58.8 mL/min (by C-G formula based on Cr of 1.3).  Assessment: 63 YOM known to pharmacy for TPN management and antibiotic dosing.  He is s/p ex lap on 1/6 and found to have perforated viscous and peritonitis. RUE doppler 1/11 reveals DVT of RUE along PICC site. PICC removed 1/11 and Left IJ placed.   Significant events: 1/11 baseline INR = 1.89, aPTT = 45sec 1/13 heparin held in AM for CT guided drain abscess drainage x 4 areas of fluid collection.  Heparin infusion resumed at previous rate (1800 units/hr) this afternoon at 1400 per IR instruction. Heparin level therapeutic after resumption of Heparin (0.40)  Today, 12/10/2015   Heparin level below therapeutic range this afternoon (0.22) on Heparin 1800units/hr.  CBC: Hgb trended down. Platelets improved. No  bleeding reported/documented.  AKI - SCr cont to improve, CrCl 75ml/min.  2055 HL=<0.10 on 2100 units/hr, no infusion or bleeding problems per RN  Goal of Therapy:  Heparin level 0.3-0.7 units/ml Monitor platelets by anticoagulation protocol: Yes   Plan:   Increase Heparin infusion to 2500 units/hr  Check heparin level in 6 hours   Daily CBC and heparin level  Lawana Pai R  12/10/2015,10:36 PM.

## 2015-12-10 NOTE — Progress Notes (Signed)
Patient ID: [name] [name], male   DOB: 03/24/1951, 65 y.o.   MRN: [id number] [facility] Progress Note:   9 Days Post-Op  Subjective: Mental status is clear.  No complaints Objective: Vital signs in last 24 hours: Temp:  [98.6 F (37 C)-99.1 F (37.3 C)] 99 F (37.2 C) (01/15 0603) Pulse Rate:  [73-88] 78 (01/15 0603) Resp:  [16] 16 (01/15 0603) BP: (135-155)/(76-81) 142/80 mmHg (01/15 0603) SpO2:  [89 %-98 %] 91 % (01/15 0603)  Intake/Output from previous day: 01/14 0701 - 01/15 0700 In: 2251 [I.V.:1221; TPN:900] Out: 1615 [Urine:1325; Drains:290] Intake/Output this shift: Total I/O In: -  Out: 350 [Urine:350]  Physical Exam: Work of breathing is normal.  Serous drainage in JP  Lab Results:  Results for orders placed or performed during the hospital encounter of 11/21/15 (from the past 48 hour(s))  Culture, routine-abscess     Status: None   Collection Time: 12/08/15 12:41 PM  Result Value Ref Range   Specimen Description ABSCESS RLQ    Special Requests NONE    Gram Stain      FEW WBC PRESENT,BOTH PMN AND MONONUCLEAR NO SQUAMOUS EPITHELIAL CELLS SEEN NO ORGANISMS SEEN Performed at [facility]    Culture      NO GROWTH 2 DAYS Performed at [facility]    Report Status 12/10/2015 FINAL   Culture, routine-abscess     Status: None   Collection Time: 12/08/15 12:41 PM  Result Value Ref Range   Specimen Description ABSCESS PERIHEPATIC RUQ     Special Requests NONE    Gram Stain      FEW WBC PRESENT, PREDOMINANTLY MONONUCLEAR NO SQUAMOUS EPITHELIAL CELLS SEEN NO ORGANISMS SEEN Performed at [facility]    Culture      NO GROWTH 2 DAYS Performed at [facility]    Report Status 12/10/2015 FINAL   Culture, routine-abscess     Status: None   Collection Time: 12/08/15 12:41 PM  Result Value Ref Range   Specimen Description ABSCESS LUQ    Special Requests NONE    Gram Stain      ABUNDANT WBC PRESENT,BOTH PMN AND  MONONUCLEAR NO SQUAMOUS EPITHELIAL CELLS SEEN NO ORGANISMS SEEN Performed at [facility]    Culture      NO GROWTH 2 DAYS Performed at [facility]    Report Status 12/10/2015 FINAL   Glucose, capillary     Status: Abnormal   Collection Time: 12/08/15  1:00 PM  Result Value Ref Range   Glucose-Capillary 153 (H) 65 - 99 mg/dL  Glucose, capillary     Status: Abnormal   Collection Time: 12/08/15  3:46 PM  Result Value Ref Range   Glucose-Capillary 169 (H) 65 - 99 mg/dL  Glucose, capillary     Status: Abnormal   Collection Time: 12/08/15  8:08 PM  Result Value Ref Range   Glucose-Capillary 125 (H) 65 - 99 mg/dL  Heparin level (unfractionated)     Status: None   Collection Time: 12/08/15 10:00 PM  Result Value Ref Range   Heparin Unfractionated 0.40 0.30 - 0.70 IU/mL    Comment:        IF HEPARIN RESULTS ARE BELOW EXPECTED VALUES, AND PATIENT DOSAGE HAS BEEN CONFIRMED, SUGGEST FOLLOW UP TESTING OF ANTITHROMBIN III LEVELS.   Glucose, capillary     Status: Abnormal   Collection Time: 12/08/15 11:41 PM  Result Value Ref Range   Glucose-Capillary 108 (H) 65 - 99  mg/dL  Glucose, capillary     Status: Abnormal   Collection Time: 12/09/15  4:14 AM  Result Value Ref Range   Glucose-Capillary 138 (H) 65 - 99 mg/dL   Comment 1 Notify RN   Heparin level (unfractionated)     Status: None   Collection Time: 12/09/15  6:43 AM  Result Value Ref Range   Heparin Unfractionated 0.38 0.30 - 0.70 IU/mL    Comment:        IF HEPARIN RESULTS ARE BELOW EXPECTED VALUES, AND PATIENT DOSAGE HAS BEEN CONFIRMED, SUGGEST FOLLOW UP TESTING OF ANTITHROMBIN III LEVELS.   Glucose, capillary     Status: Abnormal   Collection Time: 12/09/15  7:56 AM  Result Value Ref Range   Glucose-Capillary 107 (H) 65 - 99 mg/dL   Comment 1 Notify RN   Glucose, capillary     Status: Abnormal   Collection Time: 12/09/15 11:04 AM  Result Value Ref Range   Glucose-Capillary 116 (H) 65 - 99 mg/dL    Comment 1 Notify RN   Glucose, capillary     Status: Abnormal   Collection Time: 12/09/15  3:50 PM  Result Value Ref Range   Glucose-Capillary 152 (H) 65 - 99 mg/dL  Glucose, capillary     Status: None   Collection Time: 12/09/15  7:42 PM  Result Value Ref Range   Glucose-Capillary 98 65 - 99 mg/dL  Glucose, capillary     Status: None   Collection Time: 12/09/15 11:58 PM  Result Value Ref Range   Glucose-Capillary 90 65 - 99 mg/dL   Comment 1 Notify RN   Glucose, capillary     Status: None   Collection Time: 12/10/15  4:03 AM  Result Value Ref Range   Glucose-Capillary 98 65 - 99 mg/dL   Comment 1 Notify RN   Glucose, capillary     Status: Abnormal   Collection Time: 12/10/15  7:46 AM  Result Value Ref Range   Glucose-Capillary 129 (H) 65 - 99 mg/dL    Radiology/Results: Ct Image Guided Drainage By Percutaneous Catheter  12/08/2015  CLINICAL DATA:  Small bowel perforation with multiple post- operative peritoneal fluid collections and increasing leukocytosis. EXAM: 1. CT-GUIDED PERCUTANEOUS DRAINAGE OF PERIHEPATIC PERITONEAL ABSCESS 2. CT-GUIDED PERCUTANEOUS DRAINAGE OF RIGHT LOWER QUADRANT PERITONEAL ABSCESS 3. CT-GUIDED PERCUTANEOUS DRAINAGE OF LEFT UPPER QUADRANT PERITONEAL ABSCESS 4. CT-GUIDED PERCUTANEOUS DRAINAGE OF LEFT PELVIC PERITONEAL ABSCESS ANESTHESIA/SEDATION: 5.0 Mg IV Versed 100 mcg IV Fentanyl Total Moderate Sedation Time:  55 minutes PROCEDURE: The procedure, risks, benefits, and alternatives were explained to the patient. Questions regarding the procedure were encouraged and answered. The patient understands and consents to the procedure. CT was performed in a supine position. The right and left abdominal walls were prepped with Betadine in a sterile fashion, and a sterile drape was applied covering the operative field. A sterile gown and sterile gloves were used for the procedure. Local anesthesia was provided with 1% Lidocaine. Under CT guidance, a 18 gauge needle was  advanced into a right upper quadrant perihepatic abscess. Aspiration of fluid was performed and a sample sent for culture analysis. A guidewire was advanced into the collection. A 10 French percutaneous drainage catheter was then advanced over the wire. Catheter positioning was confirmed by CT. Under CT guidance, an 18 gauge needle was advanced into a left upper quadrant abscess. A fluid sample was aspirated and sent for culture analysis. A guidewire was advanced. A 10 French percutaneous drainage catheter was advanced over the wire.  Catheter positioning was confirmed by CT. Under CT guidance, an 18 gauge needle was advanced into a right lower quadrant abscess. Fluid was aspirated and a sample sent for culture analysis. A guidewire was placed. The tract was dilated and a 10 French percutaneous drainage catheter placed. Catheter positioning was confirmed by CT. Under CT guidance, a 5 [place] Yueh centesis catheter was inserted into abscess fluid localizing to the left pelvis. After confirming catheter positioning by CT, manual aspiration drainage was performed with syringes. The drainage catheter was slowly retracted. The catheter was then removed. The 3 percutaneous drains were secured with Prolene retention sutures and StatLock devices. All 3 drains were connected to suction bulbs. COMPLICATIONS: None FINDINGS: The right perihepatic collection yielded bloody fluid. The other collections yielded yellowish and slightly turbid fluid. The left pelvic fluid was the least turbid and appeared quite clear. 60 mL of fluid was able to be aspirated from a pocket in the left pelvis with no further return. An indwelling drain was not left in place in this region. IMPRESSION: CT-guided catheter drainage of 4 separate fluid collections in the peritoneal cavity. 10 French drains were placed in a right upper quadrant perihepatic abscess which yielded bloody fluid, a left upper quadrant abscess yielding yellowish turbid fluid and a  right lower quadrant abscess also yielding slightly turbid yellowish fluid. A left pelvic fluid collection was drained with a 5 French centesis catheter yielding 60 mL of serous fluid. A drain was not left in place in this collection. Electronically Signed   By: Aletta Edouard M.D.   On: 12/08/2015 15:44   Ct Image Guided Drainage Percut Cath  Peritoneal Retroperit  12/08/2015  CLINICAL DATA:  Small bowel perforation with multiple post- operative peritoneal fluid collections and increasing leukocytosis. EXAM: 1. CT-GUIDED PERCUTANEOUS DRAINAGE OF PERIHEPATIC PERITONEAL ABSCESS 2. CT-GUIDED PERCUTANEOUS DRAINAGE OF RIGHT LOWER QUADRANT PERITONEAL ABSCESS 3. CT-GUIDED PERCUTANEOUS DRAINAGE OF LEFT UPPER QUADRANT PERITONEAL ABSCESS 4. CT-GUIDED PERCUTANEOUS DRAINAGE OF LEFT PELVIC PERITONEAL ABSCESS ANESTHESIA/SEDATION: 5.0 Mg IV Versed 100 mcg IV Fentanyl Total Moderate Sedation Time:  55 minutes PROCEDURE: The procedure, risks, benefits, and alternatives were explained to the patient. Questions regarding the procedure were encouraged and answered. The patient understands and consents to the procedure. CT was performed in a supine position. The right and left abdominal walls were prepped with Betadine in a sterile fashion, and a sterile drape was applied covering the operative field. A sterile gown and sterile gloves were used for the procedure. Local anesthesia was provided with 1% Lidocaine. Under CT guidance, a 18 gauge needle was advanced into a right upper quadrant perihepatic abscess. Aspiration of fluid was performed and a sample sent for culture analysis. A guidewire was advanced into the collection. A 10 French percutaneous drainage catheter was then advanced over the wire. Catheter positioning was confirmed by CT. Under CT guidance, an 18 gauge needle was advanced into a left upper quadrant abscess. A fluid sample was aspirated and sent for culture analysis. A guidewire was advanced. A 10 French  percutaneous drainage catheter was advanced over the wire. Catheter positioning was confirmed by CT. Under CT guidance, an 18 gauge needle was advanced into a right lower quadrant abscess. Fluid was aspirated and a sample sent for culture analysis. A guidewire was placed. The tract was dilated and a 10 French percutaneous drainage catheter placed. Catheter positioning was confirmed by CT. Under CT guidance, a 5 [place] Yueh centesis catheter was inserted into abscess fluid localizing to the left pelvis.  After confirming catheter positioning by CT, manual aspiration drainage was performed with syringes. The drainage catheter was slowly retracted. The catheter was then removed. The 3 percutaneous drains were secured with Prolene retention sutures and StatLock devices. All 3 drains were connected to suction bulbs. COMPLICATIONS: None FINDINGS: The right perihepatic collection yielded bloody fluid. The other collections yielded yellowish and slightly turbid fluid. The left pelvic fluid was the least turbid and appeared quite clear. 60 mL of fluid was able to be aspirated from a pocket in the left pelvis with no further return. An indwelling drain was not left in place in this region. IMPRESSION: CT-guided catheter drainage of 4 separate fluid collections in the peritoneal cavity. 10 French drains were placed in a right upper quadrant perihepatic abscess which yielded bloody fluid, a left upper quadrant abscess yielding yellowish turbid fluid and a right lower quadrant abscess also yielding slightly turbid yellowish fluid. A left pelvic fluid collection was drained with a 5 French centesis catheter yielding 60 mL of serous fluid. A drain was not left in place in this collection. Electronically Signed   By: Aletta Edouard M.D.   On: 12/08/2015 15:44   Ct Image Guided Drainage Percut Cath  Peritoneal Retroperit  12/08/2015  CLINICAL DATA:  Small bowel perforation with multiple post- operative peritoneal fluid  collections and increasing leukocytosis. EXAM: 1. CT-GUIDED PERCUTANEOUS DRAINAGE OF PERIHEPATIC PERITONEAL ABSCESS 2. CT-GUIDED PERCUTANEOUS DRAINAGE OF RIGHT LOWER QUADRANT PERITONEAL ABSCESS 3. CT-GUIDED PERCUTANEOUS DRAINAGE OF LEFT UPPER QUADRANT PERITONEAL ABSCESS 4. CT-GUIDED PERCUTANEOUS DRAINAGE OF LEFT PELVIC PERITONEAL ABSCESS ANESTHESIA/SEDATION: 5.0 Mg IV Versed 100 mcg IV Fentanyl Total Moderate Sedation Time:  55 minutes PROCEDURE: The procedure, risks, benefits, and alternatives were explained to the patient. Questions regarding the procedure were encouraged and answered. The patient understands and consents to the procedure. CT was performed in a supine position. The right and left abdominal walls were prepped with Betadine in a sterile fashion, and a sterile drape was applied covering the operative field. A sterile gown and sterile gloves were used for the procedure. Local anesthesia was provided with 1% Lidocaine. Under CT guidance, a 18 gauge needle was advanced into a right upper quadrant perihepatic abscess. Aspiration of fluid was performed and a sample sent for culture analysis. A guidewire was advanced into the collection. A 10 French percutaneous drainage catheter was then advanced over the wire. Catheter positioning was confirmed by CT. Under CT guidance, an 18 gauge needle was advanced into a left upper quadrant abscess. A fluid sample was aspirated and sent for culture analysis. A guidewire was advanced. A 10 French percutaneous drainage catheter was advanced over the wire. Catheter positioning was confirmed by CT. Under CT guidance, an 18 gauge needle was advanced into a right lower quadrant abscess. Fluid was aspirated and a sample sent for culture analysis. A guidewire was placed. The tract was dilated and a 10 French percutaneous drainage catheter placed. Catheter positioning was confirmed by CT. Under CT guidance, a 5 [place] Yueh centesis catheter was inserted into abscess fluid  localizing to the left pelvis. After confirming catheter positioning by CT, manual aspiration drainage was performed with syringes. The drainage catheter was slowly retracted. The catheter was then removed. The 3 percutaneous drains were secured with Prolene retention sutures and StatLock devices. All 3 drains were connected to suction bulbs. COMPLICATIONS: None FINDINGS: The right perihepatic collection yielded bloody fluid. The other collections yielded yellowish and slightly turbid fluid. The left pelvic fluid was the least turbid  and appeared quite clear. 60 mL of fluid was able to be aspirated from a pocket in the left pelvis with no further return. An indwelling drain was not left in place in this region. IMPRESSION: CT-guided catheter drainage of 4 separate fluid collections in the peritoneal cavity. 10 French drains were placed in a right upper quadrant perihepatic abscess which yielded bloody fluid, a left upper quadrant abscess yielding yellowish turbid fluid and a right lower quadrant abscess also yielding slightly turbid yellowish fluid. A left pelvic fluid collection was drained with a 5 French centesis catheter yielding 60 mL of serous fluid. A drain was not left in place in this collection. Electronically Signed   By: Aletta Edouard M.D.   On: 12/08/2015 15:44   Ct Image Guided Drainage Percut Cath  Peritoneal Retroperit  12/08/2015  CLINICAL DATA:  Small bowel perforation with multiple post- operative peritoneal fluid collections and increasing leukocytosis. EXAM: 1. CT-GUIDED PERCUTANEOUS DRAINAGE OF PERIHEPATIC PERITONEAL ABSCESS 2. CT-GUIDED PERCUTANEOUS DRAINAGE OF RIGHT LOWER QUADRANT PERITONEAL ABSCESS 3. CT-GUIDED PERCUTANEOUS DRAINAGE OF LEFT UPPER QUADRANT PERITONEAL ABSCESS 4. CT-GUIDED PERCUTANEOUS DRAINAGE OF LEFT PELVIC PERITONEAL ABSCESS ANESTHESIA/SEDATION: 5.0 Mg IV Versed 100 mcg IV Fentanyl Total Moderate Sedation Time:  55 minutes PROCEDURE: The procedure, risks, benefits, and  alternatives were explained to the patient. Questions regarding the procedure were encouraged and answered. The patient understands and consents to the procedure. CT was performed in a supine position. The right and left abdominal walls were prepped with Betadine in a sterile fashion, and a sterile drape was applied covering the operative field. A sterile gown and sterile gloves were used for the procedure. Local anesthesia was provided with 1% Lidocaine. Under CT guidance, a 18 gauge needle was advanced into a right upper quadrant perihepatic abscess. Aspiration of fluid was performed and a sample sent for culture analysis. A guidewire was advanced into the collection. A 10 French percutaneous drainage catheter was then advanced over the wire. Catheter positioning was confirmed by CT. Under CT guidance, an 18 gauge needle was advanced into a left upper quadrant abscess. A fluid sample was aspirated and sent for culture analysis. A guidewire was advanced. A 10 French percutaneous drainage catheter was advanced over the wire. Catheter positioning was confirmed by CT. Under CT guidance, an 18 gauge needle was advanced into a right lower quadrant abscess. Fluid was aspirated and a sample sent for culture analysis. A guidewire was placed. The tract was dilated and a 10 French percutaneous drainage catheter placed. Catheter positioning was confirmed by CT. Under CT guidance, a 5 [place] Yueh centesis catheter was inserted into abscess fluid localizing to the left pelvis. After confirming catheter positioning by CT, manual aspiration drainage was performed with syringes. The drainage catheter was slowly retracted. The catheter was then removed. The 3 percutaneous drains were secured with Prolene retention sutures and StatLock devices. All 3 drains were connected to suction bulbs. COMPLICATIONS: None FINDINGS: The right perihepatic collection yielded bloody fluid. The other collections yielded yellowish and slightly turbid  fluid. The left pelvic fluid was the least turbid and appeared quite clear. 60 mL of fluid was able to be aspirated from a pocket in the left pelvis with no further return. An indwelling drain was not left in place in this region. IMPRESSION: CT-guided catheter drainage of 4 separate fluid collections in the peritoneal cavity. 10 French drains were placed in a right upper quadrant perihepatic abscess which yielded bloody fluid, a left upper quadrant abscess yielding yellowish turbid  fluid and a right lower quadrant abscess also yielding slightly turbid yellowish fluid. A left pelvic fluid collection was drained with a 5 French centesis catheter yielding 60 mL of serous fluid. A drain was not left in place in this collection. Electronically Signed   By: Aletta Edouard M.D.   On: 12/08/2015 15:44    Anti-infectives: Anti-infectives    Start     Dose/Rate Route Frequency Ordered Stop   12/08/15 0800  metroNIDAZOLE (FLAGYL) IVPB 500 mg     500 mg 100 mL/hr over 60 Minutes Intravenous Every 6 hours 12/08/15 0737     12/03/15 1800  vancomycin (VANCOCIN) IVPB 750 mg/150 ml premix  Status:  Discontinued     750 mg 150 mL/hr over 60 Minutes Intravenous Every 24 hours 12/03/15 1715 12/04/15 0911   12/02/15 1800  ceFEPIme (MAXIPIME) 1 g in dextrose 5 % 50 mL IVPB     1 g 100 mL/hr over 30 Minutes Intravenous Every 12 hours 12/02/15 1014 12/09/15 1759   12/02/15 0600  vancomycin (VANCOCIN) IVPB 1000 mg/200 mL premix  Status:  Discontinued     1,000 mg 200 mL/hr over 60 Minutes Intravenous 3 times per day 12/02/15 0547 12/02/15 0954   12/01/15 2200  ceFEPIme (MAXIPIME) 1 g in dextrose 5 % 50 mL IVPB  Status:  Discontinued     1 g 100 mL/hr over 30 Minutes Intravenous 3 times per day 12/01/15 1455 12/02/15 1014   11/30/15 1400  ceFEPIme (MAXIPIME) 1 g in dextrose 5 % 50 mL IVPB  Status:  Discontinued     1 g 100 mL/hr over 30 Minutes Intravenous 3 times per day 11/30/15 0913 12/01/15 1455   11/30/15 1000   metroNIDAZOLE (FLAGYL) IVPB 500 mg     500 mg 100 mL/hr over 60 Minutes Intravenous Every 8 hours 11/30/15 0858 12/07/15 0226   11/30/15 0645  vancomycin (VANCOCIN) IVPB 750 mg/150 ml premix  Status:  Discontinued     750 mg 150 mL/hr over 60 Minutes Intravenous 3 times per day 11/30/15 0630 12/02/15 0547   11/30/15 0645  imipenem-cilastatin (PRIMAXIN) 500 mg in sodium chloride 0.9 % 100 mL IVPB  Status:  Discontinued     500 mg 200 mL/hr over 30 Minutes Intravenous 3 times per day 11/30/15 0630 11/30/15 0859      Assessment/Plan: Problem List: Patient Active Problem List   Diagnosis Date Noted  . Intra-abdominal fluid collection   . S/P exploratory laparotomy   . SBO (small bowel obstruction) ([place])   . Palliative care encounter   . Deep vein thrombosis (DVT) of right upper extremity ([place]) 12/06/2015  . Acute respiratory failure with hypoxemia ([place]) 12/06/2015  . DVT of axillary vein, acute right   . Underweight 12/03/2015  . Small bowel obstruction & perforation s/p OR exploration & repair 12/01/2015 12/02/2015  . Purulent peritonitis ([place]) 12/02/2015  . Noncompliance 12/02/2015  . 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 Ischemic cardiomyopathy   . Type II diabetes mellitus ([place])   . Diabetic neuropathy ([place])   . Sepsis ([place])   . Encephalopathy   . Proteinuria 11/22/2015  . Protein-calorie malnutrition, severe ([place]) 07/30/2015  . Chest pain 11/10/2013  . Generalized weakness 10/23/2013  . Atypical chest pain 10/23/2013  . CAD (coronary artery disease) 10/23/2013  . Light headedness 07/02/2013  . Anxiety and depression 07/02/2013  . Ecchymosis 06/11/2013  . Dyspnea 06/10/2013  . Tobacco abuse 06/05/2013  . Dyslipidemia 06/05/2013  . Seizure disorder ([place]) 06/05/2013  .  Abdominal aneurysm without mention of rupture 12/17/2011  . 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   9 Days Post-Op     LOS: 19 days   Matt B. Hassell Done, MD, [facility] [phone] beeper [phone]  12/10/2015 9:38 AM

## 2015-12-10 NOTE — Progress Notes (Signed)
Chief Complaint: Intra-abdominal fluid collections  Subjective: Pt s/p perc drain X 3 of intra-abdominal fluid collections post ex lap for perf SB. Improved and moved to floor. Pt resting after receiving some Ativan, no new c/o  Allergies: Zetia; Atorvastatin; Penicillins; Pravastatin sodium; Lisinopril; and Promethazine  Medications:  Current facility-administered medications:  .  acetaminophen (TYLENOL) suppository 650 mg, 650 mg, Rectal, Q6H PRN, Michael Boston, MD .  albuterol (PROVENTIL) (2.5 MG/3ML) 0.083% nebulizer solution 2.5 mg, 2.5 mg, Inhalation, Q6H PRN, Florencia Reasons, MD .  alum & mag hydroxide-simeth (MAALOX/MYLANTA) 200-200-20 MG/5ML suspension 30 mL, 30 mL, Oral, Q6H PRN, Michael Boston, MD .  antiseptic oral rinse (CPC / CETYLPYRIDINIUM CHLORIDE 0.05%) solution 7 mL, 7 mL, Mouth Rinse, q12n4p, Kara Mead V, MD, 7 mL at 12/09/15 1600 .  bacitracin ointment, , Topical, Daily, Florencia Reasons, MD .  bisacodyl (DULCOLAX) suppository 10 mg, 10 mg, Rectal, Q12H PRN, Michael Boston, MD .  chlorhexidine (PERIDEX) 0.12 % solution 15 mL, 15 mL, Mouth Rinse, BID, Kara Mead V, MD, 15 mL at 12/10/15 1054 .  collagenase (SANTYL) ointment 1 application, 1 application, Topical, Daily, Florencia Reasons, MD, 1 application at [id number] .  diphenhydrAMINE (BENADRYL) injection 12.5-25 mg, 12.5-25 mg, Intravenous, Q6H PRN, Michael Boston, MD, 25 mg at 12/10/15 0845 .  TPN (CLINIMIX-E) Adult, , Intravenous, Continuous TPN, Last Rate: 65 mL/hr at 12/09/15 1751 **AND** fat emulsion 20 % infusion 240 mL, 240 mL, Intravenous, Continuous TPN, Minda Ditto, RPH, Last Rate: 10 mL/hr at 12/09/15 1751, 240 mL at 12/09/15 1751 .  TPN (CLINIMIX-E) Adult, , Intravenous, Continuous TPN **AND** fat emulsion 20 % infusion 240 mL, 240 mL, Intravenous, Continuous TPN, Minda Ditto, RPH .  heparin ADULT infusion 100 units/mL (25000 units/250 mL), 1,800 Units/hr, Intravenous, Continuous, Adrian Saran, [facility], Last Rate: 18 mL/hr at  12/10/15 0447, 1,800 Units/hr at 12/10/15 0447 .  hydrALAZINE (APRESOLINE) injection 10-20 mg, 10-20 mg, Intravenous, Q6H PRN, Donita Brooks, NP, 20 mg at 12/04/15 1823 .  HYDROmorphone (DILAUDID) injection 0.5-2 mg, 0.5-2 mg, Intravenous, Q2H PRN, Michael Boston, MD, 2 mg at 12/10/15 [id number] .  insulin aspart (novoLOG) injection 0-15 Units, 0-15 Units, Subcutaneous, 6 times per day, Chesley Mires, MD, 2 Units at 12/10/15 0845 .  ketorolac (TORADOL) 30 MG/ML injection 30 mg, 30 mg, Intravenous, Q6H PRN, Robbie Lis, MD .  lactated ringers infusion, , Intravenous, Continuous, Minda Ditto, RPH, Last Rate: 80 mL/hr at 12/09/15 1000 .  lip balm (CARMEX) ointment 1 application, 1 application, Topical, BID, Michael Boston, MD, 1 application at [id number] .  LORazepam (ATIVAN) injection 1 mg, 1 mg, Intravenous, Q6H PRN, Robbie Lis, MD, 1 mg at 12/10/15 1050 .  magic mouthwash, 15 mL, Oral, QID PRN, Michael Boston, MD .  menthol-cetylpyridinium (CEPACOL) lozenge 3 mg, 1 lozenge, Oral, PRN, Michael Boston, MD .  metroNIDAZOLE (FLAGYL) IVPB 500 mg, 500 mg, Intravenous, Q6H, Emina Riebock, NP, 500 mg at 12/10/15 0846 .  oxyCODONE (Oxy IR/ROXICODONE) immediate release tablet 10 mg, 10 mg, Oral, Q4H PRN, Robbie Lis, MD, 10 mg at 12/10/15 0844 .  pantoprazole (PROTONIX) injection 40 mg, 40 mg, Intravenous, Q12H, Saverio Danker, PA-C, 40 mg at 12/10/15 1054 .  phenol (CHLORASEPTIC) mouth spray 2 spray, 2 spray, Mouth/Throat, PRN, Michael Boston, MD .  sodium chloride 0.9 % injection 10-40 mL, 10-40 mL, Intracatheter, PRN, Florencia Reasons, MD, 10 mL at 12/01/15 0315 .  sodium chloride 0.9 % injection 10-40  mL, 10-40 mL, Intracatheter, PRN, Robbie Lis, MD .  sodium chloride 0.9 % injection 3 mL, 3 mL, Intravenous, Q12H, Reubin Milan, MD, 3 mL at 12/08/15 2200 .  thiamine (VITAMIN B-1) tablet 100 mg, 100 mg, Oral, Daily, 100 mg at 12/10/15 1054 **OR** thiamine (B-1) injection 100 mg, 100 mg, Intravenous, Daily, Michael Boston, MD, 100 mg at 12/08/15 0908 .  triamcinolone cream (KENALOG) 0.1 % 1 application, 1 application, Topical, BID PRN, Florencia Reasons, MD    Vital Signs: BP 142/80 mmHg  Pulse 78  Temp(Src) 99 F (37.2 C) (Oral)  Resp 16  Ht 6\' 2"  (1.88 m)  Wt 157 lb 10.1 oz (71.5 kg)  BMI 20.23 kg/m2  SpO2 91%  Physical Exam RUQ drain intact, site clean, serosanguinous output RLQ drain intact, site clean, serous output LUQ drain intact, site clean, serous output Imaging:  Ct Image Guided Drainage Percut Cath  Peritoneal Retroperit  12/08/2015  CLINICAL DATA:  Small bowel perforation with multiple post- operative peritoneal fluid collections and increasing leukocytosis. EXAM: 1. CT-GUIDED PERCUTANEOUS DRAINAGE OF PERIHEPATIC PERITONEAL ABSCESS 2. CT-GUIDED PERCUTANEOUS DRAINAGE OF RIGHT LOWER QUADRANT PERITONEAL ABSCESS 3. CT-GUIDED PERCUTANEOUS DRAINAGE OF LEFT UPPER QUADRANT PERITONEAL ABSCESS 4. CT-GUIDED PERCUTANEOUS DRAINAGE OF LEFT PELVIC PERITONEAL ABSCESS ANESTHESIA/SEDATION: 5.0 Mg IV Versed 100 mcg IV Fentanyl Total Moderate Sedation Time:  55 minutes PROCEDURE: The procedure, risks, benefits, and alternatives were explained to the patient. Questions regarding the procedure were encouraged and answered. The patient understands and consents to the procedure. CT was performed in a supine position. The right and left abdominal walls were prepped with Betadine in a sterile fashion, and a sterile drape was applied covering the operative field. A sterile gown and sterile gloves were used for the procedure. Local anesthesia was provided with 1% Lidocaine. Under CT guidance, a 18 gauge needle was advanced into a right upper quadrant perihepatic abscess. Aspiration of fluid was performed and a sample sent for culture analysis. A guidewire was advanced into the collection. A 10 French percutaneous drainage catheter was then advanced over the wire. Catheter positioning was confirmed by CT. Under CT guidance, an 18  gauge needle was advanced into a left upper quadrant abscess. A fluid sample was aspirated and sent for culture analysis. A guidewire was advanced. A 10 French percutaneous drainage catheter was advanced over the wire. Catheter positioning was confirmed by CT. Under CT guidance, an 18 gauge needle was advanced into a right lower quadrant abscess. Fluid was aspirated and a sample sent for culture analysis. A guidewire was placed. The tract was dilated and a 10 French percutaneous drainage catheter placed. Catheter positioning was confirmed by CT. Under CT guidance, a 5 [place] Yueh centesis catheter was inserted into abscess fluid localizing to the left pelvis. After confirming catheter positioning by CT, manual aspiration drainage was performed with syringes. The drainage catheter was slowly retracted. The catheter was then removed. The 3 percutaneous drains were secured with Prolene retention sutures and StatLock devices. All 3 drains were connected to suction bulbs. COMPLICATIONS: None FINDINGS: The right perihepatic collection yielded bloody fluid. The other collections yielded yellowish and slightly turbid fluid. The left pelvic fluid was the least turbid and appeared quite clear. 60 mL of fluid was able to be aspirated from a pocket in the left pelvis with no further return. An indwelling drain was not left in place in this region. IMPRESSION: CT-guided catheter drainage of 4 separate fluid collections in the peritoneal cavity. 10 French drains  were placed in a right upper quadrant perihepatic abscess which yielded bloody fluid, a left upper quadrant abscess yielding yellowish turbid fluid and a right lower quadrant abscess also yielding slightly turbid yellowish fluid. A left pelvic fluid collection was drained with a 5 French centesis catheter yielding 60 mL of serous fluid. A drain was not left in place in this collection. Electronically Signed   By: Aletta Edouard M.D.   On: 12/08/2015 15:44     Labs:  CBC:  Recent Labs  12/06/15 0610 12/06/15 1740 12/07/15 0445 12/08/15 0340  WBC 15.2* 14.6* 15.9* 17.6*  HGB 11.1* 11.0* 10.0* 10.8*  HCT 32.4* 31.8* 29.5* 31.7*  PLT 110* 118* 120* 129*    COAGS:  Recent Labs  07/26/15 1415 12/06/15 0925 12/08/15 0340  INR 1.09 1.89* 1.82*  APTT 37 45*  --     BMP:  Recent Labs  12/05/15 0430 12/06/15 0610 12/07/15 0445 12/08/15 0340  NA 142 140 141 137  K 4.4 4.1 4.0 3.9  CL 116* 108 110 104  CO2 22 24 24 26   GLUCOSE 165* 134* 140* 151*  BUN 41* 49* 51* 52*  CALCIUM 8.6* 8.5* 8.4* 8.3*  CREATININE 1.92* 1.92* 1.89* 1.68*  GFRNONAA 35* 35* 36* 42*  GFRAA 41* 41* 42* 48*    LIVER FUNCTION TESTS:  Recent Labs  11/29/15 0452 11/30/15 0415 12/04/15 0447 12/07/15 0445  BILITOT 1.1 0.9 0.5 0.9  AST 20 21 30 27   ALT 18 18 15* 14*  ALKPHOS 58 57 66 72  PROT 5.8* 6.0* 4.6* 5.3*  ALBUMIN 3.3* 3.2* 1.9* 1.8*    Assessment and Plan: S/p perc drain X 3 of intra-abdominal collections Output mostly serous Cx pending IR following  Electronically Signed: Ascencion Dike 12/10/2015, 11:48 AM   I spent a total of 15 Minutes at the the patient's bedside AND on the patient's hospital floor or unit, greater than 50% of which was counseling/coordinating care for perc drain of intraabdominal collections

## 2015-12-10 NOTE — Progress Notes (Signed)
ANTICOAGULATION CONSULT NOTE - F/u Consult  Pharmacy Consult for heparin Indication: DVT  Allergies  Allergen Reactions  . Zetia [Ezetimibe] Anaphylaxis and Swelling    Tongue and throat   . Atorvastatin Other (See Comments)    Malaise & muscle weakness  . Penicillins Other (See Comments)    Unknown.  Tolerates cefepime  . Pravastatin Sodium     Pravastatin 40 mg qday and 40 mg q M/W/F caused muscle aches  . Lisinopril Rash  . Promethazine Rash   Patient Measurements: Height: 6\' 2"  (188 cm) Weight: 157 lb 10.1 oz (71.5 kg) IBW/kg (Calculated) : 82.2 Heparin Dosing Weight: 72kg  Vital Signs: Temp: 98.6 F (37 C) (01/15 1203) Temp Source: Oral (01/15 1203) BP: 155/89 mmHg (01/15 1203) Pulse Rate: 89 (01/15 1203)  Labs:  Recent Labs  12/08/15 0340 12/08/15 2200 12/09/15 0643 12/10/15 1310  HGB 10.8*  --   --  8.4*  HCT 31.7*  --   --  25.3*  PLT 129*  --   --  254  LABPROT 21.0*  --   --   --   INR 1.82*  --   --   --   HEPARINUNFRC 0.41 0.40 0.38 0.22*  CREATININE 1.68*  --   --   --     Estimated Creatinine Clearance: 45.5 mL/min (by C-G formula based on Cr of 1.68).  Assessment: 31 YOM known to pharmacy for TPN management and antibiotic dosing.  He is s/p ex lap on 1/6 and found to have perforated viscous and peritonitis. RUE doppler 1/11 reveals DVT of RUE along PICC site. PICC removed 1/11 and Left IJ placed.   Significant events: 1/11 baseline INR = 1.89, aPTT = 45sec 1/13 heparin held in AM for CT guided drain abscess drainage x 4 areas of fluid collection.  Heparin infusion resumed at previous rate (1800 units/hr) this afternoon at 1400 per IR instruction. Heparin level therapeutic after resumption of Heparin (0.40)  Today, 12/10/2015   Heparin level below therapeutic range this afternoon (0.22) on Heparin 1800units/hr.  CBC: Hgb trended down. Platelets improved. No bleeding reported/documented.  AKI - SCr cont to improve, CrCl 13ml/min.  Goal of  Therapy:  Heparin level 0.3-0.7 units/ml Monitor platelets by anticoagulation protocol: Yes   Plan:   Increase Heparin infusion to 2100units/hr  Check heparin level in 6 hours   Daily CBC and heparin level  Romeo Rabon, PharmD, pager [phone]. 12/10/2015,2:14 PM.

## 2015-12-10 NOTE — Progress Notes (Signed)
Patient ID: [name] [name], male   DOB: August 02, 1951, 65 y.o.   MRN: [id number] [name] PROGRESS NOTE  [name] [name] [id number] DOB: 09-28-1951 DOA: 11/21/2015 PCP: Marijean Bravo, MD  Brief narrative:    65 year old male with a past medical history of hyperlipidemia, hypertension, CAD, type 2 diabetes, diabetic peripheral neuropathy, seizure disorder, polysubstance abuse and chronic pain issues (on high doses of oxycodone prior to admission) who was admitted 11/22/15 with a small bowel obstruction. Surgical consultation was obtained on admission with conservative therapy initially recommended. Patient's bowel obstruction was felt to be from adhesions and possible narcotic bowel. His NG tube was removed 11/23/15 and he seemed to be improving clinically but pain returned 11/24/15 with diet advancement to clears. Repeat films showed worsening small bowel dilatation so his NG tube was reinserted. TPN was subsequently ordered. On 11/27/15, the patient refused PICC line insertion for TPN and surgical intervention. PICC finally placed 11/28/15 with initiation of TPN.   Further, patient has developed a right upper extremity DVT and currently is on IV heparin.  On 11/30/15, the patient's condition deteriorated acutely and he was found to be confused and restless. Rapid response was called. He was febrile and tachycardic with an elevated lactate level. Due to concerns for sepsis, broad-spectrum antibiotics were initiated with vancomycin and Primaxin which was switched to cefepime given his seizure history. Patient uderwent exploratory laparotomy on 12/01/15 with findings of perforated viscus with peritoneal contamination/peritonitis. He remained intubated postoperatively and was under the care of the critical care team. He self extubated 12/02/15. Surgery continues to follow, patient has an open abdominal wound. He continues to have an ileus and is on TNA at this time. [name] assumed care  12/05/15.  Transferred to telemetry floor 12/08/2015.  Assessment/Plan:    Principal problem: SBO w/ perforated viscous and resultant peritonitis / leukocytosis / sepsis - Secondary to adhesions versus narcotic bowel. Patient initially declined surgical intervention but then since pain did not improve he finally underwent exploratory laparotomy 12/01/2015 - Surgery is following - Pain seems to be little better - Some PO liquid intake otherwise TPN for nutritional support  - White blood cell count up to 17.6 on 12/08/2015 with latest CT scan of abdomen and pelvis demonstrated moderate loculated perihepatic/perisplenic/pelvic ascites and surgery requested interventional radiology consultation for CT-guided drainage of perihepatic fluid collection - IR placed the drain from fluid collection from right perihepatic site. Drain is also in the right lower quadrant and drain also placed in left upper quadrant. There was also fluid collection in the left pelvic area and 60 mL fluid was withdrawn from the catheter but no drain was placed. - Stop cefepime today and continue flagyl   Active problems:  Severe Protein calorie malnutrition / underweight - In the context of acute on chronic illness - On TPN  Acute upper extremity DVT - US DVT right distal axillary vein, subclavian vein.  - On heparin drip   Post operative respiratory failure   - Patient required ventilatory support after exploratory laparotomy 12/01/2015.  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  Family Communication:  plan of care discussed with the patient, family not at the bedside this am Disposition Plan: unable to predict when pt can be discharged, acutely ill   IV access:  Central line   Procedures and diagnostic studies from 12/05/2015:  Ct Abdomen Pelvis Wo Contrast 12/07/2015   1. There is small bilateral pleural effusion right greater than left. Significant atelectasis or infiltrate in right lower lobe posteriorly. There is superimposed mild interstitial prominence bilateral lower lobes probable mild interstitial edema. 2. There is moderate perihepatic and perisplenic ascites. Moderate ascites noted bilateral paracolic gutters. Please note there is some loculation of perihepatic ascites with mild mass effect on the liver contour please see axial images 32 and 15. Peritoneal inflammation or infection cannot be excluded. 3. No hydronephrosis or hydroureter.  Stable bilateral renal cysts. 4. Postsurgical changes are noted anterior abdominal wall. Incomplete healed/open subcutaneous midline anterior abdominal wound. 5. Mild distended small bowel loops containing oral contrast material and some air-fluid levels. There is no transition point in caliber of small bowel. Findings most likely due to significant ileus or residual partial small bowel obstruction. There is some oral contrast material noted within distal colon. 6. Moderate pelvic ascites. Moderate gas noted within mid sigmoid colon probable mild ileus. 7. Significant anasarca infiltration of subcutaneous fat abdominal and pelvic wall. 8. 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 Electronically Signed    By: Lahoma Crocker M.D.   On: 12/07/2015 15:18   Dg Chest Port 1 View 12/06/2015  Stable bilateral pulmonary edema, with right basilar subsegmental atelectasis and associated pleural effusion. Interval placement of left internal jugular catheter with distal tip in SVC. No pneumothorax is noted. Electronically Signed   By: Marijo Conception, M.D.   On: 12/06/2015 16:10   Dg Chest Port 1 View  12/05/2015   Worsening of pulmonary interstitial infiltrates bilaterally. This may reflect pulmonary edema of cardiac or noncardiac cause or could reflect pneumonia. There are small bilateral pleural effusions. Electronically Signed   By: David  Martinique M.D.   On: 12/05/2015 08:52   Exploratory laparotomy on 12/01/15   Medical Consultants:  Surgery  CCM transfer care to [facility] PCT 12/08/2015  Other Consultants:  Nutrition PT  IAnti-Infectives:   Flagyl 11-30-2015 --> Cefepime 12-02-2015 -->   Leisa Lenz, MD  [name] Pager [phone]  Time spent in minutes: 25 minutes  If 7PM-7AM, please contact night-coverage [email or web address] Password [email or web address] 12/10/2015, 1:43 PM   LOS: 19 days    HPI/Subjective: No acute overnight events. Patient looks better.   Objective: Filed Vitals:   12/09/15 2211 12/10/15 0000 12/10/15 0603 12/10/15 1203  BP: 155/76 146/81 142/80 155/89  Pulse: 88 86 78 89  Temp: 98.6 F (37 C) 99.1 F (37.3 C) 99 F (37.2 C) 98.6 F (37 C)  TempSrc: Oral Oral Oral Oral  Resp: 16 16 16 13   Height:      Weight:      SpO2: 89% 90% 91% 100%    Intake/Output Summary (Last 24 hours) at 12/10/15 1343 Last data filed at 12/10/15 0752  Gross per 24 hour  Intake  2221 ml  Output   1615 ml  Net    606 ml    Exam:   General:  Pt is not in distress  Cardiovascular: Rate controlled, (+) S1, S2   Respiratory: no wheezing, no rhonchi   Abdomen: Has 3 JP drains, 2 on right side and one on left, (+) BS  Extremities: No edema, left unaboot (+)  Neuro: No focal deficits    Data Reviewed: Basic Metabolic Panel:  Recent Labs Lab 12/04/15 0447 12/05/15 0430 12/06/15 0610 12/07/15 0445 12/08/15 0340  NA 141 142 140 141 137  K 4.4 4.4 4.1 4.0 3.9  CL 115* 116* 108 110 104  CO2 22 22 24 24 26   GLUCOSE 161* 165* 134* 140* 151*  BUN 42* 41* 49* 51* 52*  CREATININE 1.88* 1.92* 1.92* 1.89* 1.68*  CALCIUM 8.4* 8.6* 8.5* 8.4* 8.3*  MG 2.0 1.8 1.8 1.6*  --   PHOS 3.4 3.4  --  4.1  --    Liver Function Tests:  Recent Labs Lab 12/04/15 0447 12/07/15 0445  AST 30 27  ALT 15* 14*  ALKPHOS 66 72  BILITOT 0.5 0.9  PROT 4.6* 5.3*  ALBUMIN 1.9* 1.8*   No results for input(s): LIPASE, AMYLASE in the last 168 hours. No results for input(s): AMMONIA in the last 168 hours. CBC:  Recent Labs Lab 12/04/15 0447 12/06/15 0610 12/06/15 1740 12/07/15 0445 12/08/15 0340  WBC 10.7* 15.2* 14.6* 15.9* 17.6*  NEUTROABS 8.0*  --   --   --   --   HGB 11.0* 11.1* 11.0* 10.0* 10.8*  HCT 32.8* 32.4* 31.8* 29.5* 31.7*  MCV 87.5 84.6 84.6 85.0 85.4  PLT 133* 110* 118* 120* 129*   Cardiac Enzymes: No results for input(s): CKTOTAL, CKMB, CKMBINDEX, TROPONINI in the last 168 hours. BNP: Invalid input(s): POCBNP CBG:  Recent Labs Lab 12/09/15 1942 12/09/15 2358 12/10/15 0403 12/10/15 0746 12/10/15 1206  GLUCAP 98 90 98 129* 98    Recent Results (from the past 240 hour(s))  MRSA PCR Screening     Status: None   Collection Time: 12/04/15  9:57 AM  Result Value Ref Range Status   MRSA by PCR NEGATIVE NEGATIVE Final    Comment:        The GeneXpert MRSA Assay (FDA approved for NASAL specimens only), is one component of a comprehensive MRSA colonization surveillance program. It is not intended to diagnose MRSA infection nor to guide or monitor treatment for MRSA infections.   Culture, routine-abscess     Status: None   Collection Time: 12/08/15 12:41 PM  Result Value Ref Range Status   Specimen Description ABSCESS RLQ  Final   Special Requests  NONE  Final   Gram Stain   Final    FEW WBC PRESENT,BOTH PMN AND MONONUCLEAR NO SQUAMOUS EPITHELIAL CELLS SEEN NO ORGANISMS SEEN Performed at [facility]    Culture   Final    NO GROWTH 2 DAYS Performed at [facility]    Report Status 12/10/2015 FINAL  Final  Anaerobic culture     Status: None (Preliminary result)   Collection Time: 12/08/15 12:41 PM  Result Value Ref Range Status   Specimen Description ABSCESS RLQ  Final   Special Requests NONE  Final   Gram Stain PENDING  Incomplete   Culture   Final    NO ANAEROBES ISOLATED; CULTURE IN PROGRESS FOR 5 DAYS Performed at [facility]    Report Status PENDING  Incomplete  Culture, routine-abscess     Status: None   Collection Time: 12/08/15 12:41 PM  Result Value Ref Range Status   Specimen Description ABSCESS PERIHEPATIC RUQ   Final   Special Requests NONE  Final   Gram Stain   Final    FEW WBC PRESENT, PREDOMINANTLY MONONUCLEAR NO SQUAMOUS EPITHELIAL CELLS SEEN NO ORGANISMS SEEN Performed at [facility]    Culture   Final    NO GROWTH 2 DAYS Performed at [facility]    Report Status 12/10/2015 FINAL  Final  Anaerobic culture     Status: None (Preliminary result)   Collection Time: 12/08/15 12:41 PM  Result Value Ref Range Status   Specimen Description ABSCESS PERIHEPATIC RUQ  Final   Special Requests NONE  Final   Gram Stain PENDING  Incomplete   Culture   Final    NO ANAEROBES ISOLATED; CULTURE IN PROGRESS FOR 5 DAYS Performed at [facility]    Report Status PENDING  Incomplete  Culture, routine-abscess     Status: None   Collection Time: 12/08/15 12:41 PM  Result Value Ref Range Status   Specimen Description ABSCESS LUQ  Final   Special Requests NONE  Final   Gram Stain   Final    ABUNDANT WBC PRESENT,BOTH PMN AND MONONUCLEAR NO SQUAMOUS EPITHELIAL CELLS SEEN NO ORGANISMS SEEN Performed at [facility]    Culture   Final    NO GROWTH 2  DAYS Performed at [facility]    Report Status 12/10/2015 FINAL  Final  Anaerobic culture     Status: None (Preliminary result)   Collection Time: 12/08/15 12:41 PM  Result Value Ref Range Status   Specimen Description ABSCESS LUQ  Final   Special Requests NONE  Final   Gram Stain PENDING  Incomplete   Culture   Final    NO ANAEROBES ISOLATED; CULTURE IN PROGRESS FOR 5 DAYS Performed at [facility]    Report Status PENDING  Incomplete     Scheduled Meds: . antiseptic oral rinse  7 mL Mouth Rinse q12n4p  . bacitracin   Topical Daily  . chlorhexidine  15 mL Mouth Rinse BID  . collagenase  1 application Topical Daily  . insulin aspart  0-15 Units Subcutaneous 6 times per day  . lip balm  1 application Topical BID  . metronidazole  500 mg Intravenous Q6H  . pantoprazole (PROTONIX) IV  40 mg Intravenous Q12H  . sodium chloride  3 mL Intravenous Q12H  . thiamine  100 mg Oral Daily   Or  . thiamine  100 mg Intravenous Daily   Continuous Infusions: . Marland KitchenTPN (CLINIMIX-E) Adult 65 mL/hr at 12/09/15 1751   And  . fat emulsion 240 mL (12/09/15 1751)  . Marland KitchenTPN (CLINIMIX-E) Adult     And  . fat emulsion    . heparin 1,800 Units/hr (12/10/15 0447)  . lactated ringers 80 mL/hr at 12/09/15 1000

## 2015-12-11 LAB — COMPREHENSIVE METABOLIC PANEL
ALBUMIN: 1.9 g/dL — AB (ref 3.5–5.0)
ALT: 11 U/L — ABNORMAL LOW (ref 17–63)
ANION GAP: 7 (ref 5–15)
AST: 21 U/L (ref 15–41)
Alkaline Phosphatase: 74 U/L (ref 38–126)
BILIRUBIN TOTAL: 0.9 mg/dL (ref 0.3–1.2)
BUN: 38 mg/dL — ABNORMAL HIGH (ref 6–20)
CHLORIDE: 104 mmol/L (ref 101–111)
CO2: 27 mmol/L (ref 22–32)
Calcium: 8.2 mg/dL — ABNORMAL LOW (ref 8.9–10.3)
Creatinine, Ser: 1.17 mg/dL (ref 0.61–1.24)
GFR calc Af Amer: >60 mL/min (ref >60)
GFR calc non Af Amer: >60 mL/min (ref >60)
GLUCOSE: 141 mg/dL — AB (ref 65–99)
POTASSIUM: 3.7 mmol/L (ref 3.5–5.1)
Sodium: 138 mmol/L (ref 135–145)
TOTAL PROTEIN: 5.8 g/dL — AB (ref 6.5–8.1)

## 2015-12-11 LAB — MAGNESIUM: MAGNESIUM: 1.5 mg/dL — AB (ref 1.7–2.4)

## 2015-12-11 LAB — GLUCOSE, CAPILLARY
GLUCOSE-CAPILLARY: 123 mg/dL — AB (ref 65–99)
GLUCOSE-CAPILLARY: 124 mg/dL — AB (ref 65–99)
GLUCOSE-CAPILLARY: 138 mg/dL — AB (ref 65–99)
GLUCOSE-CAPILLARY: 81 mg/dL (ref 65–99)
Glucose-Capillary: 70 mg/dL (ref 65–99)
Glucose-Capillary: 86 mg/dL (ref 65–99)
Glucose-Capillary: 95 mg/dL (ref 65–99)

## 2015-12-11 LAB — DIFFERENTIAL
Basophils Absolute: 0 10*3/uL (ref 0.0–0.1)
Basophils Relative: 0 %
EOS ABS: 0.1 10*3/uL (ref 0.0–0.7)
Eosinophils Relative: 1 %
Lymphocytes Relative: 9 %
Lymphs Abs: 1.2 10*3/uL (ref 0.7–4.0)
MONO ABS: 1.4 10*3/uL — AB (ref 0.1–1.0)
Monocytes Relative: 10 %
NEUTROS PCT: 80 %
Neutro Abs: 10.8 10*3/uL — ABNORMAL HIGH (ref 1.7–7.7)

## 2015-12-11 LAB — HEPARIN LEVEL (UNFRACTIONATED)
Heparin Unfractionated: 0.46 IU/mL (ref 0.30–0.70)
Heparin Unfractionated: 0.52 IU/mL (ref 0.30–0.70)

## 2015-12-11 LAB — TRIGLYCERIDES: Triglycerides: 95 mg/dL (ref <150)

## 2015-12-11 LAB — CBC
HCT: 26.8 % — ABNORMAL LOW (ref 39.0–52.0)
Hemoglobin: 9 g/dL — ABNORMAL LOW (ref 13.0–17.0)
MCH: 28.8 pg (ref 26.0–34.0)
MCHC: 33.6 g/dL (ref 30.0–36.0)
MCV: 85.6 fL (ref 78.0–100.0)
PLATELETS: 309 10*3/uL (ref 150–400)
RBC: 3.13 MIL/uL — AB (ref 4.22–5.81)
RDW: 14.7 % (ref 11.5–15.5)
WBC: 13.5 10*3/uL — AB (ref 4.0–10.5)

## 2015-12-11 LAB — PREALBUMIN: Prealbumin: 2.7 mg/dL — ABNORMAL LOW (ref 18–38)

## 2015-12-11 LAB — PHOSPHORUS: Phosphorus: 3.2 mg/dL (ref 2.5–4.6)

## 2015-12-11 MED ORDER — ONDANSETRON HCL 4 MG/2ML IJ SOLN
4.0000 mg | Freq: Four times a day (QID) | INTRAMUSCULAR | Status: DC | PRN
  Administered 2015-12-11 – 2015-12-29 (×2): 4 mg via INTRAVENOUS
  Filled 2015-12-11 (×2): qty 2

## 2015-12-11 MED ORDER — TRACE MINERALS CR-CU-MN-SE-ZN 10-1000-500-60 MCG/ML IV SOLN
INTRAVENOUS | Status: AC
  Administered 2015-12-11: 18:00:00 via INTRAVENOUS
  Filled 2015-12-11: qty 1560

## 2015-12-11 MED ORDER — MAGNESIUM SULFATE 2 GM/50ML IV SOLN
2.0000 g | Freq: Once | INTRAVENOUS | Status: AC
  Administered 2015-12-11: 2 g via INTRAVENOUS
  Filled 2015-12-11: qty 50

## 2015-12-11 MED ORDER — FAT EMULSION 20 % IV EMUL
240.0000 mL | INTRAVENOUS | Status: AC
  Administered 2015-12-11: 240 mL via INTRAVENOUS
  Filled 2015-12-11: qty 250

## 2015-12-11 MED ORDER — SALINE SPRAY 0.65 % NA SOLN
1.0000 | NASAL | Status: DC | PRN
  Administered 2015-12-20: 1 via NASAL
  Filled 2015-12-11 (×2): qty 44

## 2015-12-11 NOTE — Progress Notes (Addendum)
10 Days Post-Op  Subjective: He is still confused this AM, he does recognize me.  He couldn't tell me where he is without some help, knew he came to hospital for abdominal pain.  I don't hear any bowel sounds this AM,  Drain are all clear right now.    Objective: Vital signs in last 24 hours: Temp:  [98.1 F (36.7 C)-98.6 F (37 C)] 98.1 F (36.7 C) (01/16 0445) Pulse Rate:  [70-89] 85 (01/16 0445) Resp:  [13-18] 18 (01/16 0445) BP: (136-155)/(83-89) 136/83 mmHg (01/16 0445) SpO2:  [98 %-100 %] 100 % (01/16 0445) Last BM Date: 12/07/15 Drains #1 20 ml, #2 30 ml, #3 60 ml Afebrile, VSS CMP OK, prealbumin 2.7, WBC 13.5 Diet:  Clears   Intake/Output from previous day: 01/15 0701 - 01/16 0700 In: 3251.8 [I.V.:1266.8; IV Piggyback:800; TPN:1125] Out: 1110 [Urine:1000; Drains:110] Intake/Output this shift:    General appearance: alert, cooperative and no distress.  He is confused but cooperative this AM.  I don't know how much of the info we give him he is retaining. GI: No bowel sounds, open wound with retention sutures looks fine.  3 drains in place, all look clear currently.  Lab Results:   Recent Labs  12/10/15 1310 12/11/15 0518  WBC 14.4* 13.5*  HGB 8.4* 9.0*  HCT 25.3* 26.8*  PLT 254 309    BMET  Recent Labs  12/10/15 1310 12/11/15 0518  NA 138 138  K 3.6 3.7  CL 101 104  CO2 29 27  GLUCOSE 116* 141*  BUN 40* 38*  CREATININE 1.30* 1.17  CALCIUM 8.3* 8.2*   PT/INR No results for input(s): LABPROT, INR in the last 72 hours.   Recent Labs Lab 12/07/15 0445 12/11/15 0518  AST 27 21  ALT 14* 11*  ALKPHOS 72 74  BILITOT 0.9 0.9  PROT 5.3* 5.8*  ALBUMIN 1.8* 1.9*     Lipase     Component Value Date/Time   LIPASE 21 11/21/2015 1621     Studies/Results: No results found.  Medications: . antiseptic oral rinse  7 mL Mouth Rinse q12n4p  . bacitracin   Topical Daily  . chlorhexidine  15 mL Mouth Rinse BID  . collagenase  1 application Topical  Daily  . insulin aspart  0-15 Units Subcutaneous 6 times per day  . lip balm  1 application Topical BID  . metronidazole  500 mg Intravenous Q6H  . pantoprazole (PROTONIX) IV  40 mg Intravenous Q12H  . sodium chloride  3 mL Intravenous Q12H  . thiamine  100 mg Oral Daily   Or  . thiamine  100 mg Intravenous Daily   . Marland KitchenTPN (CLINIMIX-E) Adult 65 mL/hr at 12/10/15 1700   And  . fat emulsion 240 mL (12/10/15 1700)  . heparin 2,500 Units/hr (12/11/15 0853)  . lactated ringers 80 mL/hr at 12/11/15 [id number]   Assessment/Plan SBO day 10 before he accepted surgery Perforation of the small intestine with peritonitis status post radical peritoneal debridement, enterolysis, 12/03/15, Dr. Hassell Done  Sepsis/ CT guided abscess drainage x 4 12/08/15 DR. Kathlene Cote, IR  Post op ileus DVT RUE on Heparin drip Atelectasis, and pleural effusion, possible pneumonia Chronic constipation on pain medications Encephalopathy/confusion now appears resolved Mild renal insuffiencey - creatinine is back to normal this AM 12/11/15.   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 CAD s/p NSTEMI 05/2013 WITH stendts to RCA Ischemic cardiomyopathy  S/p iliac artery stent Left foot ulcers AODM Diabetic neuropathy  Arthritis  Tobacco use Antibiotics: 6 days of  vancomycin/ 7 days of Cefepime/Day 11 Flagyl (penicillin allergy reported) DVT: currently on SCD and heparin drip.  Plan:  He needs to be walked as much as possible.  Dr. Charlies Silvers is trying to control his pain with fewer narcotics.  Watch drains.      LOS: 20 days    [name],[name] 12/11/2015    Stable.  Has 3 drains... Mostly serous in nature. Cultures look negative so far Consider follow-up CT soon to assess adequacy of drainage.  We'll discuss with radiology first   Monson. Dalbert Batman, M.D., [facility] General and Minimally invasive Surgery

## 2015-12-11 NOTE — Progress Notes (Signed)
Patient ID: [name] [name], male   DOB: 10/26/1951, 65 y.o.   MRN: [id number]    Referring Physician(s): CCS  Chief Complaint:  Abdominal fluid collections  Subjective:  Patient a little confused; will answer questions but with some delayed responses. States abdomen is a little sore. Denies significant nausea /vomiting.  Allergies: Zetia; Atorvastatin; Penicillins; Pravastatin sodium; Lisinopril; and Promethazine  Medications: Prior to Admission medications   Medication Sig Start Date End Date Taking? Authorizing Provider  albuterol (PROVENTIL HFA;VENTOLIN HFA) 108 (90 BASE) MCG/ACT inhaler Inhale 2 puffs into the lungs every 6 (six) hours as needed for wheezing or shortness of breath. 11/11/13  Yes Ripudeep Krystal Eaton, MD  aspirin EC 81 MG tablet Take 1 tablet (81 mg total) by mouth daily. 11/06/15  Yes Sherren Mocha, MD  collagenase (SANTYL) ointment Apply thin layer to wound bed of left foot daily Patient taking differently: Apply 1 application topically daily. Apply thin layer to wound bed of left foot daily 10/26/15  Yes Rosetta Posner, MD  gabapentin (NEURONTIN) 300 MG capsule Take 300 mg by mouth 3 (three) times daily. 09/30/15  Yes Historical Provider, MD  glipiZIDE (GLUCOTROL) 10 MG tablet Take 10 mg by mouth 2 (two) times daily before a meal.   Yes Historical Provider, MD  ketoconazole (NIZORAL) 2 % cream Apply 1 application topically daily as needed for irritation.   Yes Historical Provider, MD  Multiple Vitamins-Minerals (MULTIVITAMIN WITH MINERALS) tablet Take 4 tablets by mouth 3 (three) times daily.   Yes Historical Provider, MD  nitroGLYCERIN (NITROSTAT) 0.4 MG SL tablet Place 0.4 mg under the tongue every 5 (five) minutes as needed for chest pain.   Yes Historical Provider, MD  oxyCODONE (ROXICODONE) 15 MG immediate release tablet Take 3 tablets (45 mg total) by mouth every 6 (six) hours as needed (severe pain). 07/30/15  Yes Kimberly A Trinh, PA-C  PATADAY 0.2 % SOLN Place 1  drop into both eyes daily.  08/11/14  Yes Historical Provider, MD  polyethylene glycol (MIRALAX / GLYCOLAX) packet Take 17 g by mouth daily as needed for mild constipation or moderate constipation.    Yes Historical Provider, MD  triamcinolone cream (KENALOG) 0.1 % Apply 1 application topically 2 (two) times daily as needed (for eczema).   Yes Historical Provider, MD  zolpidem (AMBIEN) 10 MG tablet Take 10 mg by mouth at bedtime as needed for sleep.   Yes Historical Provider, MD  silver sulfADIAZINE (SILVADENE) 1 % cream Apply 1 application topically daily. Patient not taking: Reported on 11/14/2015 07/25/15   Rosetta Posner, MD     Vital Signs: BP 136/83 mmHg  Pulse 85  Temp(Src) 98.1 F (36.7 C) (Oral)  Resp 18  Ht 6\' 2"  (1.88 m)  Wt 157 lb 10.1 oz (71.5 kg)  BMI 20.23 kg/m2  SpO2 100%  Physical Exam right perihepatic/right lower quadrant/left upper quadrant drains are intact with serous-appearing output. Cultures negative to date; outputs range from 20-60 mL. Insertion sites okay, mildly tender.  Imaging: Ct Abdomen Pelvis Wo Contrast  12/07/2015  CLINICAL DATA:  Follow-up small bowel obstruction EXAM: CT ABDOMEN AND PELVIS WITHOUT CONTRAST TECHNIQUE: Multidetector CT imaging of the abdomen and pelvis was performed following the standard protocol without IV contrast. COMPARISON:  11/21/2015 FINDINGS: Lung bases shows hazy mild interstitial prominence probable mild interstitial edema. There is small bilateral pleural effusion right greater than left. There is significant atelectasis or infiltrate in right lower lobe posteriorly. Small atelectasis noted left lower lobe posteriorly.  Sagittal images of the spine shows degenerative changes thoracic and lumbar spine. Study is limited without IV contrast. There is moderate perihepatic and perisplenic ascites. Please note there is some loculation of the perihepatic ascites with mild mass effect in the liver dome and anterior liver margin, please see  axial image 38 and 15. Peritoneal inflammation or infection cannot be entirely excluded. Clinical correlation is necessary. Old appearing right lower anterior rib fracture. No intrahepatic biliary ductal dilatation. Unenhanced pancreas, spleen and adrenal glands are unremarkable. Unenhanced kidneys shows no nephrolithiasis. No hydronephrosis or hydroureter. A large cyst in lower pole of the right kidney again noted measures 6.6 cm. Six in midpole of the left kidney again noted measures 3 cm. Mild distended small bowel loops are noted in mid abdomen containing oral contrast material and some air-fluid levels. Findings most likely due to significant ileus or less likely improving partial small bowel obstruction. No transition point in caliber of small bowel. There is small amount of contrast material noted in distal colon. Postsurgical changes are noted anterior abdominal wall. Partially open wound noted anterior abdominal wall axial image 67. Clinical correlation is necessary. There is a small right inguinal scrotal canal hernia containing fluid. Small to moderate pelvic ascites is noted. The urinary bladder is unremarkable. Moderate gaseous distension in the mid sigmoid colon. There is significant anasarca infiltration of subcutaneous fat in abdominal and pelvic wall. 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 Limited assessment without IV contrast. Significant disc space flattening with endplate sclerotic changes and vacuum disc phenomenon at L5-S1 level. Moderate ascites noted bilateral pericolic gutters. Tiny amount of air within urinary bladder probable post instrumentation. IMPRESSION: 1. There is small bilateral pleural effusion right greater than left. Significant atelectasis or infiltrate in right lower lobe posteriorly. There is superimposed mild interstitial prominence bilateral lower lobes probable mild interstitial edema. 2. There is moderate perihepatic and perisplenic ascites. Moderate ascites  noted bilateral paracolic gutters. Please note there is some loculation of perihepatic ascites with mild mass effect on the liver contour please see axial images 32 and 15. Peritoneal inflammation or infection cannot be excluded. 3. No hydronephrosis or hydroureter.  Stable bilateral renal cysts. 4. Postsurgical changes are noted anterior abdominal wall. Incomplete healed/open subcutaneous midline anterior abdominal wound. 5. Mild distended small bowel loops containing oral contrast material and some air-fluid levels. There is no transition point in caliber of small bowel. Findings most likely due to significant ileus or residual partial small bowel obstruction. There is some oral contrast material noted within distal colon. 6. Moderate pelvic ascites. Moderate gas noted within mid sigmoid colon probable mild ileus. 7. Significant anasarca infiltration of subcutaneous fat abdominal and pelvic wall. 8. 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 Electronically Signed   By: Lahoma Crocker M.D.   On: 12/07/2015 15:18   Ct Image Guided Drainage By Percutaneous Catheter  12/08/2015  CLINICAL DATA:  Small bowel perforation with multiple post- operative peritoneal fluid collections and increasing leukocytosis. EXAM: 1. CT-GUIDED PERCUTANEOUS DRAINAGE OF PERIHEPATIC PERITONEAL ABSCESS 2. CT-GUIDED PERCUTANEOUS DRAINAGE OF RIGHT LOWER QUADRANT PERITONEAL ABSCESS 3. CT-GUIDED PERCUTANEOUS DRAINAGE OF LEFT UPPER QUADRANT PERITONEAL ABSCESS 4. CT-GUIDED PERCUTANEOUS DRAINAGE OF LEFT PELVIC PERITONEAL ABSCESS ANESTHESIA/SEDATION: 5.0 Mg IV Versed 100 mcg IV Fentanyl Total Moderate Sedation Time:  55 minutes PROCEDURE: The procedure, risks, benefits, and alternatives were explained to the patient. Questions regarding the procedure were encouraged and answered. The patient understands and consents to the procedure. CT was performed in a supine  position. The right and left abdominal walls were prepped with Betadine in  a sterile fashion, and a sterile drape was applied covering the operative field. A sterile gown and sterile gloves were used for the procedure. Local anesthesia was provided with 1% Lidocaine. Under CT guidance, a 18 gauge needle was advanced into a right upper quadrant perihepatic abscess. Aspiration of fluid was performed and a sample sent for culture analysis. A guidewire was advanced into the collection. A 10 French percutaneous drainage catheter was then advanced over the wire. Catheter positioning was confirmed by CT. Under CT guidance, an 18 gauge needle was advanced into a left upper quadrant abscess. A fluid sample was aspirated and sent for culture analysis. A guidewire was advanced. A 10 French percutaneous drainage catheter was advanced over the wire. Catheter positioning was confirmed by CT. Under CT guidance, an 18 gauge needle was advanced into a right lower quadrant abscess. Fluid was aspirated and a sample sent for culture analysis. A guidewire was placed. The tract was dilated and a 10 French percutaneous drainage catheter placed. Catheter positioning was confirmed by CT. Under CT guidance, a 5 [place] Yueh centesis catheter was inserted into abscess fluid localizing to the left pelvis. After confirming catheter positioning by CT, manual aspiration drainage was performed with syringes. The drainage catheter was slowly retracted. The catheter was then removed. The 3 percutaneous drains were secured with Prolene retention sutures and StatLock devices. All 3 drains were connected to suction bulbs. COMPLICATIONS: None FINDINGS: The right perihepatic collection yielded bloody fluid. The other collections yielded yellowish and slightly turbid fluid. The left pelvic fluid was the least turbid and appeared quite clear. 60 mL of fluid was able to be aspirated from a pocket in the left pelvis with no further return. An indwelling drain was not left in place in this region. IMPRESSION: CT-guided catheter  drainage of 4 separate fluid collections in the peritoneal cavity. 10 French drains were placed in a right upper quadrant perihepatic abscess which yielded bloody fluid, a left upper quadrant abscess yielding yellowish turbid fluid and a right lower quadrant abscess also yielding slightly turbid yellowish fluid. A left pelvic fluid collection was drained with a 5 French centesis catheter yielding 60 mL of serous fluid. A drain was not left in place in this collection. Electronically Signed   By: Aletta Edouard M.D.   On: 12/08/2015 15:44   Ct Image Guided Drainage Percut Cath  Peritoneal Retroperit  12/08/2015  CLINICAL DATA:  Small bowel perforation with multiple post- operative peritoneal fluid collections and increasing leukocytosis. EXAM: 1. CT-GUIDED PERCUTANEOUS DRAINAGE OF PERIHEPATIC PERITONEAL ABSCESS 2. CT-GUIDED PERCUTANEOUS DRAINAGE OF RIGHT LOWER QUADRANT PERITONEAL ABSCESS 3. CT-GUIDED PERCUTANEOUS DRAINAGE OF LEFT UPPER QUADRANT PERITONEAL ABSCESS 4. CT-GUIDED PERCUTANEOUS DRAINAGE OF LEFT PELVIC PERITONEAL ABSCESS ANESTHESIA/SEDATION: 5.0 Mg IV Versed 100 mcg IV Fentanyl Total Moderate Sedation Time:  55 minutes PROCEDURE: The procedure, risks, benefits, and alternatives were explained to the patient. Questions regarding the procedure were encouraged and answered. The patient understands and consents to the procedure. CT was performed in a supine position. The right and left abdominal walls were prepped with Betadine in a sterile fashion, and a sterile drape was applied covering the operative field. A sterile gown and sterile gloves were used for the procedure. Local anesthesia was provided with 1% Lidocaine. Under CT guidance, a 18 gauge needle was advanced into a right upper quadrant perihepatic abscess. Aspiration of fluid was performed and a sample sent for culture analysis. A  guidewire was advanced into the collection. A 10 French percutaneous drainage catheter was then advanced over the  wire. Catheter positioning was confirmed by CT. Under CT guidance, an 18 gauge needle was advanced into a left upper quadrant abscess. A fluid sample was aspirated and sent for culture analysis. A guidewire was advanced. A 10 French percutaneous drainage catheter was advanced over the wire. Catheter positioning was confirmed by CT. Under CT guidance, an 18 gauge needle was advanced into a right lower quadrant abscess. Fluid was aspirated and a sample sent for culture analysis. A guidewire was placed. The tract was dilated and a 10 French percutaneous drainage catheter placed. Catheter positioning was confirmed by CT. Under CT guidance, a 5 [place] Yueh centesis catheter was inserted into abscess fluid localizing to the left pelvis. After confirming catheter positioning by CT, manual aspiration drainage was performed with syringes. The drainage catheter was slowly retracted. The catheter was then removed. The 3 percutaneous drains were secured with Prolene retention sutures and StatLock devices. All 3 drains were connected to suction bulbs. COMPLICATIONS: None FINDINGS: The right perihepatic collection yielded bloody fluid. The other collections yielded yellowish and slightly turbid fluid. The left pelvic fluid was the least turbid and appeared quite clear. 60 mL of fluid was able to be aspirated from a pocket in the left pelvis with no further return. An indwelling drain was not left in place in this region. IMPRESSION: CT-guided catheter drainage of 4 separate fluid collections in the peritoneal cavity. 10 French drains were placed in a right upper quadrant perihepatic abscess which yielded bloody fluid, a left upper quadrant abscess yielding yellowish turbid fluid and a right lower quadrant abscess also yielding slightly turbid yellowish fluid. A left pelvic fluid collection was drained with a 5 French centesis catheter yielding 60 mL of serous fluid. A drain was not left in place in this collection. Electronically  Signed   By: Aletta Edouard M.D.   On: 12/08/2015 15:44   Ct Image Guided Drainage Percut Cath  Peritoneal Retroperit  12/08/2015  CLINICAL DATA:  Small bowel perforation with multiple post- operative peritoneal fluid collections and increasing leukocytosis. EXAM: 1. CT-GUIDED PERCUTANEOUS DRAINAGE OF PERIHEPATIC PERITONEAL ABSCESS 2. CT-GUIDED PERCUTANEOUS DRAINAGE OF RIGHT LOWER QUADRANT PERITONEAL ABSCESS 3. CT-GUIDED PERCUTANEOUS DRAINAGE OF LEFT UPPER QUADRANT PERITONEAL ABSCESS 4. CT-GUIDED PERCUTANEOUS DRAINAGE OF LEFT PELVIC PERITONEAL ABSCESS ANESTHESIA/SEDATION: 5.0 Mg IV Versed 100 mcg IV Fentanyl Total Moderate Sedation Time:  55 minutes PROCEDURE: The procedure, risks, benefits, and alternatives were explained to the patient. Questions regarding the procedure were encouraged and answered. The patient understands and consents to the procedure. CT was performed in a supine position. The right and left abdominal walls were prepped with Betadine in a sterile fashion, and a sterile drape was applied covering the operative field. A sterile gown and sterile gloves were used for the procedure. Local anesthesia was provided with 1% Lidocaine. Under CT guidance, a 18 gauge needle was advanced into a right upper quadrant perihepatic abscess. Aspiration of fluid was performed and a sample sent for culture analysis. A guidewire was advanced into the collection. A 10 French percutaneous drainage catheter was then advanced over the wire. Catheter positioning was confirmed by CT. Under CT guidance, an 18 gauge needle was advanced into a left upper quadrant abscess. A fluid sample was aspirated and sent for culture analysis. A guidewire was advanced. A 10 French percutaneous drainage catheter was advanced over the wire. Catheter positioning was confirmed by CT. Under CT  guidance, an 18 gauge needle was advanced into a right lower quadrant abscess. Fluid was aspirated and a sample sent for culture analysis. A  guidewire was placed. The tract was dilated and a 10 French percutaneous drainage catheter placed. Catheter positioning was confirmed by CT. Under CT guidance, a 5 [place] Yueh centesis catheter was inserted into abscess fluid localizing to the left pelvis. After confirming catheter positioning by CT, manual aspiration drainage was performed with syringes. The drainage catheter was slowly retracted. The catheter was then removed. The 3 percutaneous drains were secured with Prolene retention sutures and StatLock devices. All 3 drains were connected to suction bulbs. COMPLICATIONS: None FINDINGS: The right perihepatic collection yielded bloody fluid. The other collections yielded yellowish and slightly turbid fluid. The left pelvic fluid was the least turbid and appeared quite clear. 60 mL of fluid was able to be aspirated from a pocket in the left pelvis with no further return. An indwelling drain was not left in place in this region. IMPRESSION: CT-guided catheter drainage of 4 separate fluid collections in the peritoneal cavity. 10 French drains were placed in a right upper quadrant perihepatic abscess which yielded bloody fluid, a left upper quadrant abscess yielding yellowish turbid fluid and a right lower quadrant abscess also yielding slightly turbid yellowish fluid. A left pelvic fluid collection was drained with a 5 French centesis catheter yielding 60 mL of serous fluid. A drain was not left in place in this collection. Electronically Signed   By: Aletta Edouard M.D.   On: 12/08/2015 15:44   Ct Image Guided Drainage Percut Cath  Peritoneal Retroperit  12/08/2015  CLINICAL DATA:  Small bowel perforation with multiple post- operative peritoneal fluid collections and increasing leukocytosis. EXAM: 1. CT-GUIDED PERCUTANEOUS DRAINAGE OF PERIHEPATIC PERITONEAL ABSCESS 2. CT-GUIDED PERCUTANEOUS DRAINAGE OF RIGHT LOWER QUADRANT PERITONEAL ABSCESS 3. CT-GUIDED PERCUTANEOUS DRAINAGE OF LEFT UPPER QUADRANT  PERITONEAL ABSCESS 4. CT-GUIDED PERCUTANEOUS DRAINAGE OF LEFT PELVIC PERITONEAL ABSCESS ANESTHESIA/SEDATION: 5.0 Mg IV Versed 100 mcg IV Fentanyl Total Moderate Sedation Time:  55 minutes PROCEDURE: The procedure, risks, benefits, and alternatives were explained to the patient. Questions regarding the procedure were encouraged and answered. The patient understands and consents to the procedure. CT was performed in a supine position. The right and left abdominal walls were prepped with Betadine in a sterile fashion, and a sterile drape was applied covering the operative field. A sterile gown and sterile gloves were used for the procedure. Local anesthesia was provided with 1% Lidocaine. Under CT guidance, a 18 gauge needle was advanced into a right upper quadrant perihepatic abscess. Aspiration of fluid was performed and a sample sent for culture analysis. A guidewire was advanced into the collection. A 10 French percutaneous drainage catheter was then advanced over the wire. Catheter positioning was confirmed by CT. Under CT guidance, an 18 gauge needle was advanced into a left upper quadrant abscess. A fluid sample was aspirated and sent for culture analysis. A guidewire was advanced. A 10 French percutaneous drainage catheter was advanced over the wire. Catheter positioning was confirmed by CT. Under CT guidance, an 18 gauge needle was advanced into a right lower quadrant abscess. Fluid was aspirated and a sample sent for culture analysis. A guidewire was placed. The tract was dilated and a 10 French percutaneous drainage catheter placed. Catheter positioning was confirmed by CT. Under CT guidance, a 5 [place] Yueh centesis catheter was inserted into abscess fluid localizing to the left pelvis. After confirming catheter positioning by CT, manual aspiration  drainage was performed with syringes. The drainage catheter was slowly retracted. The catheter was then removed. The 3 percutaneous drains were secured with  Prolene retention sutures and StatLock devices. All 3 drains were connected to suction bulbs. COMPLICATIONS: None FINDINGS: The right perihepatic collection yielded bloody fluid. The other collections yielded yellowish and slightly turbid fluid. The left pelvic fluid was the least turbid and appeared quite clear. 60 mL of fluid was able to be aspirated from a pocket in the left pelvis with no further return. An indwelling drain was not left in place in this region. IMPRESSION: CT-guided catheter drainage of 4 separate fluid collections in the peritoneal cavity. 10 French drains were placed in a right upper quadrant perihepatic abscess which yielded bloody fluid, a left upper quadrant abscess yielding yellowish turbid fluid and a right lower quadrant abscess also yielding slightly turbid yellowish fluid. A left pelvic fluid collection was drained with a 5 French centesis catheter yielding 60 mL of serous fluid. A drain was not left in place in this collection. Electronically Signed   By: Aletta Edouard M.D.   On: 12/08/2015 15:44    Labs:  CBC:  Recent Labs  12/07/15 0445 12/08/15 0340 12/10/15 1310 12/11/15 0518  WBC 15.9* 17.6* 14.4* 13.5*  HGB 10.0* 10.8* 8.4* 9.0*  HCT 29.5* 31.7* 25.3* 26.8*  PLT 120* 129* 254 309    COAGS:  Recent Labs  07/26/15 1415 12/06/15 0925 12/08/15 0340  INR 1.09 1.89* 1.82*  APTT 37 45*  --     BMP:  Recent Labs  12/07/15 0445 12/08/15 0340 12/10/15 1310 12/11/15 0518  NA 141 137 138 138  K 4.0 3.9 3.6 3.7  CL 110 104 101 104  CO2 24 26 29 27   GLUCOSE 140* 151* 116* 141*  BUN 51* 52* 40* 38*  CALCIUM 8.4* 8.3* 8.3* 8.2*  CREATININE 1.89* 1.68* 1.30* 1.17  GFRNONAA 36* 42* 57* >60  GFRAA 42* 48* >60 >60    LIVER FUNCTION TESTS:  Recent Labs  11/30/15 0415 12/04/15 0447 12/07/15 0445 12/11/15 0518  BILITOT 0.9 0.5 0.9 0.9  AST 21 30 27 21   ALT 18 15* 14* 11*  ALKPHOS 57 66 72 74  PROT 6.0* 4.6* 5.3* 5.8*  ALBUMIN 3.2* 1.9*  1.8* 1.9*    Assessment and Plan: Patient status post small bowel perforation/repair. Status post drainage of right perihepatic/right lower quadrant and left upper quadrant fluid collections 1/13; afebrile, WBC 13.5(14.4), fluid cultures negative to date; creat nl; as long as drain output continues downward trend would recheck follow-up CT in next 48-72 hours. Other plans as per surgery and IM.   Electronically Signed: D. Rowe Robert 12/11/2015, 2:07 PM   I spent a total of 15 minutes at the the patient's bedside AND on the patient's hospital floor or unit, greater than 50% of which was counseling/coordinating care for abdominal fluid collection drainages

## 2015-12-11 NOTE — Progress Notes (Signed)
Date: December 11, 2015 Chart reviewed for concurrent status and case management needs. Will continue to follow patient for changes and needs:  Continues to have poor p.o. Intake and ileus Velva Harman, RN, BSN, [place]   [phone]

## 2015-12-11 NOTE — Progress Notes (Signed)
ANTICOAGULATION CONSULT NOTE - F/u Consult  Pharmacy Consult for heparin Indication: DVT  Allergies  Allergen Reactions  . Zetia [Ezetimibe] Anaphylaxis and Swelling    Tongue and throat   . Atorvastatin Other (See Comments)    Malaise & muscle weakness  . Penicillins Other (See Comments)    Unknown.  Tolerates cefepime  . Pravastatin Sodium     Pravastatin 40 mg qday and 40 mg q M/W/F caused muscle aches  . Lisinopril Rash  . Promethazine Rash   Patient Measurements: Height: 6\' 2"  (188 cm) Weight: 157 lb 10.1 oz (71.5 kg) IBW/kg (Calculated) : 82.2 Heparin Dosing Weight: 72kg  Vital Signs: Temp: 98.1 F (36.7 C) (01/16 0445) Temp Source: Oral (01/16 0445) BP: 136/83 mmHg (01/16 0445) Pulse Rate: 85 (01/16 0445)  Labs:  Recent Labs  12/10/15 1310 12/10/15 2055 12/11/15 0518  HGB 8.4*  --  9.0*  HCT 25.3*  --  26.8*  PLT 254  --  309  HEPARINUNFRC 0.22* <0.10* 0.46  CREATININE 1.30*  --  1.17    Estimated Creatinine Clearance: 65.4 mL/min (by C-G formula based on Cr of 1.17).  Assessment: 30 YOM known to pharmacy for TPN management and antibiotic dosing.  He is s/p ex lap on 1/6 and found to have perforated viscous and peritonitis. RUE doppler 1/11 reveals DVT of RUE along PICC site. PICC removed 1/11 and Left IJ placed.   Significant events: 1/11 baseline INR = 1.89, aPTT = 45sec 1/13 heparin held in AM for CT guided drain abscess drainage x 4 areas of fluid collection.  Heparin infusion resumed at previous rate (1800 units/hr) this afternoon at 1400 per IR instruction. Heparin level therapeutic after resumption of Heparin (0.40)  Today, 12/11/2015   Heparin level therapeutic after rate increased from 2100 to 2500 units/hr last PM  CBC: stable. No bleeding reported/documented.  AKI - SCr cont to improve, CrCl 65ml/min.  Goal of Therapy:  Heparin level 0.3-0.7 units/ml Monitor platelets by anticoagulation protocol: Yes   Plan:   Continue heparin  infusion at current rate of 2500 units/hr  Recheck heparin level in 8 hours to confirm continued goal level at current rate   Daily CBC and heparin level   Adrian Saran, PharmD, BCPS Pager [phone] 12/11/2015 9:57 AM

## 2015-12-11 NOTE — Progress Notes (Signed)
ANTICOAGULATION CONSULT NOTE - Brief Follow Up  Pharmacy Consult for heparin Indication: DVT  Repeat heparin level this evening remains therapeutic at 0.52 units/ml with infusion at 2500 units/hr.  Note, repeat heparin level was ordered for 1400 this afternoon but two lab technicians were unable to draw so lab rescheduled the level for 2000 when a third lab technician could attempt and was successful at obtaining a level.  No other complications reported.  Next follow up is the daily HL in AM.  Hershal Coria, PharmD, BCPS Pager: [phone] 12/11/2015 9:53 PM

## 2015-12-11 NOTE — Progress Notes (Signed)
PARENTERAL NUTRITION CONSULT NOTE - FOLLOW-UP  Pharmacy Consult for TPN Indication: Bowel obstruction, post op ileus  Allergies  Allergen Reactions  . Zetia [Ezetimibe] Anaphylaxis and Swelling    Tongue and throat   . Atorvastatin Other (See Comments)    Malaise & muscle weakness  . Penicillins Other (See Comments)    Unknown.  Tolerates cefepime  . Pravastatin Sodium     Pravastatin 40 mg qday and 40 mg q M/W/F caused muscle aches  . Lisinopril Rash  . Promethazine Rash   Patient Measurements: Height: 6\' 2"  (188 cm) Weight: 157 lb 10.1 oz (71.5 kg) IBW/kg (Calculated) : 82.2 Usual Weight: 137-140 lbs  Vital Signs: Temp: 98.1 F (36.7 C) (01/16 0445) Temp Source: Oral (01/16 0445) BP: 136/83 mmHg (01/16 0445) Pulse Rate: 85 (01/16 0445) Intake/Output from previous day: 01/15 0701 - 01/16 0700 In: 3251.8 [I.V.:1266.8; IV Piggyback:800; TPN:1125] Out: 1110 [Urine:1000; Drains:110] Intake/Output from this shift:   Labs:  Recent Labs  12/10/15 1310 12/11/15 0518  WBC 14.4* 13.5*  HGB 8.4* 9.0*  HCT 25.3* 26.8*  PLT 254 309    Recent Labs  12/10/15 1310 12/11/15 0518  NA 138 138  K 3.6 3.7  CL 101 104  CO2 29 27  GLUCOSE 116* 141*  BUN 40* 38*  CREATININE 1.30* 1.17  CALCIUM 8.3* 8.2*  MG  --  1.5*  PHOS  --  3.2  PROT  --  5.8*  ALBUMIN  --  1.9*  AST  --  21  ALT  --  11*  ALKPHOS  --  74  BILITOT  --  0.9  PREALBUMIN  --  2.7*  TRIG  --  95   Estimated Creatinine Clearance: 65.4 mL/min (by C-G formula based on Cr of 1.17).    Recent Labs  12/11/15 0022 12/11/15 0442 12/11/15 0755  GLUCAP 124* 123* 138*   Medical History: Past Medical History  Diagnosis Date  . Hyperlipidemia   . Hypertension   . Leg pain   . Peripheral vascular disease ([place])     s/p prior Ao-Bifem BPG  . 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 Coronary artery disease     a. NSTEMI 7/14=> LHC 06/07/13: Proximal  LAD 30%, ostial D1 30%, AV circumflex 80%, then occluded, ostial OM2 occluded, left to left collaterals, mid RCA 95%, inferior HK, EF 60%. PCI: Promus DES to the mid RCA.  No intervention was recommended for the CTO circumflex.  . Ischemic cardiomyopathy     a. Echocardiogram 06/06/13: EF 35-40%, inferior AK, mild MR.  Marland Kitchen Shortness of breath     "can come on at anytime lately; before that it was just w/exertion" (11/10/2013)  . Type II diabetes mellitus ([place])   . [place] mal seizure [facility])     "last one was ~ 3 yr ago; controlled w/daily RX" (11/10/2013)  . Arthritis     "neck, back, legs" (11/10/2013)  . Kidney stones     "when I was younger; passed w/o treatment" (11/10/2013)  . Myocardial infarction ([place])   . Diabetic neuropathy ([place])   . H. pylori infection   . Wears glasses   . NSTEMI (non-ST elevated myocardial infarction) ([place]) 06/05/2013  . Shoulder contusion 11/07/2013   Medications:  Infusions:  . Marland KitchenTPN (CLINIMIX-E) Adult 65 mL/hr at 12/10/15 1700   And  . fat emulsion 240 mL (12/10/15 1700)  . heparin 2,500 Units/hr (12/11/15  [id number])  . lactated ringers 80 mL/hr at 12/11/15 0233   Insulin Requirements in the past 24 hours: 7 units SSI/24h + 20 units Regular insulin in TPN  Current Nutrition: NPO for abscess aspiration/drain placement, some CL  IVF: LR 80 ml/hr  Central access: PICC placed 1/3 TPN start date: 1/3  ASSESSMENT                                                                                                          HPI: 4 yoM presented to ED on 12/27 with abdominal pain.  PMH includes HTN, HLD, PVD - s/p fem bypass, CAD s/p NSTEMI 2014, cardiomyopathy, DM, diabetic peripheral neuropathy, seizures, and chronic pain on chronic opioids.  Surgical history significant for open AAA repair.  CT shows partial SBO, likely adhesions.  Patient refused surgery, until 1/6 s/p repair of perforated visous.  Plan for conservative management with TPN, bowel rest.   Significant  events:  1/1: TPN per pharmacy ordered - unable to start without central access.  1/3: PICC line placed; TPN started 1/5: Acute encephalopathy with fevers (102F) overnight- sepsis protocol initiated.  Broad-spectrum abx started.  1/6: Consented to laparotomy w/ findings of perforated viscus and peritonitis. Txfer to ICU   1/10: NGT removed 1/11: started clear liquid diet 1/13 NPO again for CT guided drain placement for perihepatic fluid collection suspicious for abscess. FL diet post-procedure 1/16: per CCS, no bowel sounds noted today, patient needs to be walked as much as possible  Today 12/11/2015:   Glucose: at goal.  Insulin added to TPN. Hx DM on glipizide PTA.   Electrolytes - WNL except mag slightly low.  Renal - AKI; SCr trending down.  UOP decreased, 1L, 0.6 ml/kg/hr  I/O: + 2142 mL    Drains x3: output 390 ml (1/14)  LFTs - AST/ALT and Tbili ok   TGs -  (Hx of HPL) 124 (1/2), 164 (1/9), 95 (1/16)  Prealbumin - 3.7 (1/2), < 2 (1/9)  NUTRITIONAL GOALS                                                                                             RD recs: 1600-1800 Kcal/day, 65-80 g protein/day, > 2 L fluid/day  Clinimix E 5/15 at a goal rate of 65 ml/hr + 20% fat emulsion at 10 ml/hr to provide: 78 g/day protein, 1588 Kcal/day.  PLAN  Continue Clinimix E 5/15 at goal rate of 65 ml/h  Insulin added to TPN so that patient receives 20 units of regular insulin over 24 hour infusion - CBGs stable so will not adjust insulin further in TPN  Continue 20% fat emulsion at 10 ml/hr  MagSulfate 2g IV x 1  Standard MVI, MTE in TPN bag.  Continue q6h CBGs with moderate correction insulin.  TPN lab panels on Mondays & Thursdays.  BMET in AM.   Adrian Saran, PharmD, BCPS Pager [phone] 12/11/2015 10:02 AM

## 2015-12-11 NOTE — Progress Notes (Signed)
Patient ID: NEIKO WIK, male   DOB: 12-14-1950, 65 y.o.   MRN: [id number] [name] PROGRESS NOTE  INMAR LAPLUME [id number] DOB: 08-04-1951 DOA: 11/21/2015 PCP: Marijean Bravo, MD  Brief narrative:    65 year old male with a past medical history of hyperlipidemia, hypertension, CAD, type 2 diabetes, diabetic peripheral neuropathy, seizure disorder, polysubstance abuse and chronic pain issues (on high doses of oxycodone prior to admission) who was admitted 11/22/15 with a small bowel obstruction. Surgical consultation was obtained on admission with conservative therapy initially recommended. Patient's bowel obstruction was felt to be from adhesions and possible narcotic bowel. His NG tube was removed 11/23/15 and he seemed to be improving clinically but pain returned 11/24/15 with diet advancement to clears. Repeat films showed worsening small bowel dilatation so his NG tube was reinserted. TPN was subsequently ordered. On 11/27/15, the patient refused PICC line insertion for TPN and surgical intervention. PICC finally placed 11/28/15 with initiation of TPN.   Further, patient has developed a right upper extremity DVT and currently is on IV heparin.  On 11/30/15, the patient's condition deteriorated acutely and he was found to be confused and restless. Rapid response was called. He was febrile and tachycardic with an elevated lactate level. Due to concerns for sepsis, broad-spectrum antibiotics were initiated with vancomycin and Primaxin which was switched to cefepime given his seizure history. Patient uderwent exploratory laparotomy on 12/01/15 with findings of perforated viscus with peritoneal contamination/peritonitis. He remained intubated postoperatively and was under the care of the critical care team. He self extubated 12/02/15. Surgery continues to follow, patient has an open abdominal wound. He continues to have an ileus and is on TNA at this time. [name] assumed care  12/05/15.  Transferred to telemetry floor 12/08/2015.  Assessment/Plan:    Principal problem: SBO w/ perforated viscous and resultant peritonitis / leukocytosis / sepsis - Secondary to adhesions versus narcotic bowel. Patient initially declined surgical intervention but then since pain did not improve he finally underwent exploratory laparotomy 12/01/2015 - Continue nutritional support with TPN - Since white blood cell count up to 17.6 on 12/08/2015 with latest CT scan of abdomen and pelvis demonstrated moderate loculated perihepatic/perisplenic/pelvic ascites and surgery requested interventional radiology consultation for CT-guided drainage of perihepatic fluid collection - IR placed the drain from fluid collection from right perihepatic site. Drain is also in the right lower quadrant and drain also placed in left upper quadrant. There was also fluid collection in the left pelvic area and 60 mL fluid was withdrawn from the catheter but no drain was placed. - White blood cell count is improving  - Cefepime stopped 12/10/2015  - Continue Flagyl   Active problems:  Severe Protein calorie malnutrition / underweight - In the context of acute on chronic illness - Diet per surgery, sips of clears if tolerated - Nutritional support with TPN  Acute upper extremity DVT - US DVT right distal axillary vein, subclavian vein.  - Continue heparin drip - No reports of bleeding  Post operative respiratory failure   - Patient required ventilatory support after exploratory laparotomy 12/01/2015.  - Patient self extubated 12/02/2015 - Respiratory status has improved with Lasix 80 mg IV given 12/05/2015 - Respiratory status remains stable  Acute Encephalopathy in setting of sepsis / history of seizure disorder - EEG done 11/30/2015 showed no acute seizures - Load with Keppra if seizures occur   Acute kidney injury - In the setting of sepsis and small bowel obstruction with perforated viscus -  Creatinine normalized with hydration  Anemia of chronic disease / thrombocytopenia - Related to history of substance and alcohol use  - Hemoglobin stable at 9 and platelet count is now within normal limits  Diabetes mellitus type 2 with peripheral circulatory complications without long-term insulin use - CBGs in past 24 hours: 124, 123, 138 - Continue sliding scale insulin  Left lower extremity wound / S/p left fem pop bypass w/ non-healing ulcer - WOC assessment done   DVT Prophylaxis  - SCD's bilaterally in hospital   Code Status: Full.  Family Communication:  plan of care discussed with the patient, family not at the bedside this am Disposition Plan: unable to predict when pt can be discharged, slowly improving   IV access:  Central line   Procedures and diagnostic studies from 12/05/2015:  Ct Abdomen Pelvis Wo Contrast 12/07/2015   1. There is small bilateral pleural effusion right greater than left. Significant atelectasis or infiltrate in right lower lobe posteriorly. There is superimposed mild interstitial prominence bilateral lower lobes probable mild interstitial edema. 2. There is moderate perihepatic and perisplenic ascites. Moderate ascites noted bilateral paracolic gutters. Please note there is some loculation of perihepatic ascites with mild mass effect on the liver contour please see axial images 32 and 15. Peritoneal inflammation or infection cannot be excluded. 3. No hydronephrosis or hydroureter.  Stable bilateral renal cysts. 4. Postsurgical changes are noted anterior abdominal wall. Incomplete healed/open subcutaneous midline anterior abdominal wound. 5. Mild distended small bowel loops containing oral contrast material and some air-fluid levels. There is no transition point in caliber of small bowel. Findings most likely due to significant ileus or residual partial small bowel obstruction. There is some oral contrast material noted within distal colon. 6. Moderate pelvic  ascites. Moderate gas noted within mid sigmoid colon probable mild ileus. 7. Significant anasarca infiltration of subcutaneous fat abdominal and pelvic wall. 8. 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 Electronically Signed   By: Lahoma Crocker M.D.   On: 12/07/2015 15:18   Dg Chest Port 1 View 12/06/2015  Stable bilateral pulmonary edema, with right basilar subsegmental atelectasis and associated pleural effusion. Interval placement of left internal jugular catheter with distal tip in SVC. No pneumothorax is noted. Electronically Signed   By: Marijo Conception, M.D.   On: 12/06/2015 16:10   Dg Chest Port 1 View  12/05/2015   Worsening of pulmonary interstitial infiltrates bilaterally. This may reflect pulmonary edema of cardiac or noncardiac cause or could reflect pneumonia. There are small bilateral pleural effusions. Electronically Signed   By: David  Martinique M.D.   On: 12/05/2015 08:52   Exploratory laparotomy on 12/01/15   Medical Consultants:  Surgery  CCM transfer care to [facility] 1-10 PCT 12/08/2015  Other Consultants:  Nutrition PT  IAnti-Infectives:   Flagyl 11-30-2015 --> Cefepime 12-02-2015 --> 12/10/2015    Leisa Lenz, MD  [name] Pager [phone]  Time spent in minutes: 25 minutes  If 7PM-7AM, please contact night-coverage [email or web address] Password [email or web address] 12/11/2015, 9:12 AM   LOS: 20 days    HPI/Subjective: No acute overnight events. Patient still has pain.   Objective: Filed Vitals:   12/10/15 0603 12/10/15 1203 12/10/15 2014 12/11/15 0445  BP: 142/80 155/89 155/85 136/83  Pulse: 78 89 70 85  Temp: 99 F (37.2 C) 98.6 F (37 C) 98.3 F (36.8 C) 98.1 F (36.7 C)  TempSrc: Oral Oral Oral Oral  Resp: 16 13 16 18   Height:  Weight:      SpO2: 91% 100% 98% 100%    Intake/Output Summary (Last 24 hours) at 12/11/15 0912 Last data filed at 12/11/15 0600  Gross per 24 hour  Intake 2751.75 ml  Output    710 ml  Net 2041.75 ml     Exam:   General:  Pt is alert, awake, no distress  Cardiovascular: RRR, (+) S1, S2   Respiratory:  Bilateral air entry, no wheezing  Abdomen: Has 3 JP drains, 2 on right side and one on left, tender to palpation, no rebound or guarding   Extremities: No edema right lower extremity, left unaboot (+)  Neuro:  nonfocal   Data Reviewed: Basic Metabolic Panel:  Recent Labs Lab 12/05/15 0430 12/06/15 0610 12/07/15 0445 12/08/15 0340 12/10/15 1310 12/11/15 0518  NA 142 140 141 137 138 138  K 4.4 4.1 4.0 3.9 3.6 3.7  CL 116* 108 110 104 101 104  CO2 22 24 24 26 29 27   GLUCOSE 165* 134* 140* 151* 116* 141*  BUN 41* 49* 51* 52* 40* 38*  CREATININE 1.92* 1.92* 1.89* 1.68* 1.30* 1.17  CALCIUM 8.6* 8.5* 8.4* 8.3* 8.3* 8.2*  MG 1.8 1.8 1.6*  --   --  1.5*  PHOS 3.4  --  4.1  --   --  3.2   Liver Function Tests:  Recent Labs Lab 12/07/15 0445 12/11/15 0518  AST 27 21  ALT 14* 11*  ALKPHOS 72 74  BILITOT 0.9 0.9  PROT 5.3* 5.8*  ALBUMIN 1.8* 1.9*   No results for input(s): LIPASE, AMYLASE in the last 168 hours. No results for input(s): AMMONIA in the last 168 hours. CBC:  Recent Labs Lab 12/06/15 1740 12/07/15 0445 12/08/15 0340 12/10/15 1310 12/11/15 0518  WBC 14.6* 15.9* 17.6* 14.4* 13.5*  NEUTROABS  --   --   --   --  10.8*  HGB 11.0* 10.0* 10.8* 8.4* 9.0*  HCT 31.8* 29.5* 31.7* 25.3* 26.8*  MCV 84.6 85.0 85.4 86.3 85.6  PLT 118* 120* 129* 254 309   Cardiac Enzymes: No results for input(s): CKTOTAL, CKMB, CKMBINDEX, TROPONINI in the last 168 hours. BNP: Invalid input(s): POCBNP CBG:  Recent Labs Lab 12/10/15 1609 12/10/15 2024 12/11/15 0022 12/11/15 0442 12/11/15 0755  GLUCAP 100* 110* 124* 123* 138*    Recent Results (from the past 240 hour(s))  MRSA PCR Screening     Status: None   Collection Time: 12/04/15  9:57 AM  Result Value Ref Range Status   MRSA by PCR NEGATIVE NEGATIVE Final    Comment:        The GeneXpert MRSA Assay  (FDA approved for NASAL specimens only), is one component of a comprehensive MRSA colonization surveillance program. It is not intended to diagnose MRSA infection nor to guide or monitor treatment for MRSA infections.   Culture, routine-abscess     Status: None   Collection Time: 12/08/15 12:41 PM  Result Value Ref Range Status   Specimen Description ABSCESS RLQ  Final   Special Requests NONE  Final   Gram Stain   Final    FEW WBC PRESENT,BOTH PMN AND MONONUCLEAR NO SQUAMOUS EPITHELIAL CELLS SEEN NO ORGANISMS SEEN Performed at [facility]    Culture   Final    NO GROWTH 2 DAYS Performed at [facility]    Report Status 12/10/2015 FINAL  Final  Anaerobic culture     Status: None (Preliminary result)   Collection Time: 12/08/15 12:41 PM  Result Value Ref Range Status   Specimen Description ABSCESS RLQ  Final   Special Requests NONE  Final   Gram Stain PENDING  Incomplete   Culture   Final    NO ANAEROBES ISOLATED; CULTURE IN PROGRESS FOR 5 DAYS Performed at [facility]    Report Status PENDING  Incomplete  Culture, routine-abscess     Status: None   Collection Time: 12/08/15 12:41 PM  Result Value Ref Range Status   Specimen Description ABSCESS PERIHEPATIC RUQ   Final   Special Requests NONE  Final   Gram Stain   Final    FEW WBC PRESENT, PREDOMINANTLY MONONUCLEAR NO SQUAMOUS EPITHELIAL CELLS SEEN NO ORGANISMS SEEN Performed at [facility]    Culture   Final    NO GROWTH 2 DAYS Performed at [facility]    Report Status 12/10/2015 FINAL  Final  Anaerobic culture     Status: None (Preliminary result)   Collection Time: 12/08/15 12:41 PM  Result Value Ref Range Status   Specimen Description ABSCESS PERIHEPATIC RUQ  Final   Special Requests NONE  Final   Gram Stain PENDING  Incomplete   Culture   Final    NO ANAEROBES ISOLATED; CULTURE IN PROGRESS FOR 5 DAYS Performed at [facility]    Report Status PENDING   Incomplete  Culture, routine-abscess     Status: None   Collection Time: 12/08/15 12:41 PM  Result Value Ref Range Status   Specimen Description ABSCESS LUQ  Final   Special Requests NONE  Final   Gram Stain   Final    ABUNDANT WBC PRESENT,BOTH PMN AND MONONUCLEAR NO SQUAMOUS EPITHELIAL CELLS SEEN NO ORGANISMS SEEN Performed at [facility]    Culture   Final    NO GROWTH 2 DAYS Performed at [facility]    Report Status 12/10/2015 FINAL  Final  Anaerobic culture     Status: None (Preliminary result)   Collection Time: 12/08/15 12:41 PM  Result Value Ref Range Status   Specimen Description ABSCESS LUQ  Final   Special Requests NONE  Final   Gram Stain PENDING  Incomplete   Culture   Final    NO ANAEROBES ISOLATED; CULTURE IN PROGRESS FOR 5 DAYS Performed at [facility]    Report Status PENDING  Incomplete     Scheduled Meds: . antiseptic oral rinse  7 mL Mouth Rinse q12n4p  . bacitracin   Topical Daily  . chlorhexidine  15 mL Mouth Rinse BID  . collagenase  1 application Topical Daily  . insulin aspart  0-15 Units Subcutaneous 6 times per day  . lip balm  1 application Topical BID  . metronidazole  500 mg Intravenous Q6H  . pantoprazole (PROTONIX) IV  40 mg Intravenous Q12H  . sodium chloride  3 mL Intravenous Q12H  . thiamine  100 mg Oral Daily   Or  . thiamine  100 mg Intravenous Daily   Continuous Infusions: . Marland KitchenTPN (CLINIMIX-E) Adult 65 mL/hr at 12/10/15 1700   And  . fat emulsion 240 mL (12/10/15 1700)  . heparin 2,500 Units/hr (12/11/15 0853)  . lactated ringers 80 mL/hr at 12/11/15 [id number]

## 2015-12-11 NOTE — Progress Notes (Signed)
PT Cancellation Note  Patient Details Name: [name] [name] MRN: [id number] DOB: May 02, 1951   Cancelled Treatment:    Reason Eval/Treat Not Completed: Pain limiting ability to participate, patient in his bed, multiple mail items on bed, patient reporting pain in abdoment, does not appear as perky as last visit. Will check back 1/17.   Claretha Cooper 12/11/2015, 4:45 PM Tresa Endo PT [phone]

## 2015-12-12 ENCOUNTER — Encounter (HOSPITAL_COMMUNITY): Payer: Self-pay | Admitting: Internal Medicine

## 2015-12-12 DIAGNOSIS — D638 Anemia in other chronic diseases classified elsewhere: Secondary | ICD-10-CM | POA: Diagnosis present

## 2015-12-12 DIAGNOSIS — R569 Unspecified convulsions: Secondary | ICD-10-CM

## 2015-12-12 DIAGNOSIS — E1159 Type 2 diabetes mellitus with other circulatory complications: Secondary | ICD-10-CM | POA: Diagnosis present

## 2015-12-12 DIAGNOSIS — N179 Acute kidney failure, unspecified: Secondary | ICD-10-CM | POA: Diagnosis present

## 2015-12-12 DIAGNOSIS — A419 Sepsis, unspecified organism: Secondary | ICD-10-CM | POA: Diagnosis present

## 2015-12-12 DIAGNOSIS — G9340 Encephalopathy, unspecified: Secondary | ICD-10-CM | POA: Diagnosis present

## 2015-12-12 DIAGNOSIS — D696 Thrombocytopenia, unspecified: Secondary | ICD-10-CM | POA: Diagnosis present

## 2015-12-12 DIAGNOSIS — D72829 Elevated white blood cell count, unspecified: Secondary | ICD-10-CM | POA: Diagnosis present

## 2015-12-12 LAB — GLUCOSE, CAPILLARY
GLUCOSE-CAPILLARY: 92 mg/dL (ref 65–99)
GLUCOSE-CAPILLARY: 104 mg/dL — AB (ref 65–99)
GLUCOSE-CAPILLARY: 106 mg/dL — AB (ref 65–99)
GLUCOSE-CAPILLARY: 138 mg/dL — AB (ref 65–99)
Glucose-Capillary: 80 mg/dL (ref 65–99)
Glucose-Capillary: 91 mg/dL (ref 65–99)

## 2015-12-12 LAB — MAGNESIUM: MAGNESIUM: 1.8 mg/dL (ref 1.7–2.4)

## 2015-12-12 LAB — CBC
HCT: 28.1 % — ABNORMAL LOW (ref 39.0–52.0)
HEMOGLOBIN: 9.2 g/dL — AB (ref 13.0–17.0)
MCH: 28.5 pg (ref 26.0–34.0)
MCHC: 32.7 g/dL (ref 30.0–36.0)
MCV: 87 fL (ref 78.0–100.0)
Platelets: 343 10*3/uL (ref 150–400)
RBC: 3.23 MIL/uL — AB (ref 4.22–5.81)
RDW: 14.8 % (ref 11.5–15.5)
WBC: 13.5 10*3/uL — ABNORMAL HIGH (ref 4.0–10.5)

## 2015-12-12 LAB — HEPARIN LEVEL (UNFRACTIONATED): Heparin Unfractionated: 0.6 IU/mL (ref 0.30–0.70)

## 2015-12-12 LAB — BASIC METABOLIC PANEL
ANION GAP: 7 (ref 5–15)
BUN: 36 mg/dL — ABNORMAL HIGH (ref 6–20)
CHLORIDE: 101 mmol/L (ref 101–111)
CO2: 27 mmol/L (ref 22–32)
Calcium: 8.1 mg/dL — ABNORMAL LOW (ref 8.9–10.3)
Creatinine, Ser: 1.28 mg/dL — ABNORMAL HIGH (ref 0.61–1.24)
GFR calc Af Amer: >60 mL/min (ref >60)
GFR calc non Af Amer: 58 mL/min — ABNORMAL LOW (ref >60)
GLUCOSE: 82 mg/dL (ref 65–99)
POTASSIUM: 3.8 mmol/L (ref 3.5–5.1)
Sodium: 135 mmol/L (ref 135–145)

## 2015-12-12 LAB — PHOSPHORUS: Phosphorus: 3.6 mg/dL (ref 2.5–4.6)

## 2015-12-12 MED ORDER — TRACE MINERALS CR-CU-MN-SE-ZN 10-1000-500-60 MCG/ML IV SOLN
INTRAVENOUS | Status: AC
  Administered 2015-12-12: 18:00:00 via INTRAVENOUS
  Filled 2015-12-12: qty 1560

## 2015-12-12 MED ORDER — HYDROMORPHONE HCL 2 MG/ML IJ SOLN
2.0000 mg | INTRAMUSCULAR | Status: DC | PRN
  Administered 2015-12-12: 3 mg via INTRAVENOUS
  Administered 2015-12-12 (×2): 2 mg via INTRAVENOUS
  Administered 2015-12-12 – 2015-12-13 (×2): 3 mg via INTRAVENOUS
  Administered 2015-12-13 (×4): 2 mg via INTRAVENOUS
  Administered 2015-12-13: 3 mg via INTRAVENOUS
  Administered 2015-12-13 – 2015-12-15 (×17): 2 mg via INTRAVENOUS
  Administered 2015-12-16: 3 mg via INTRAVENOUS
  Administered 2015-12-16 – 2015-12-17 (×12): 2 mg via INTRAVENOUS
  Administered 2015-12-17 (×2): 3 mg via INTRAVENOUS
  Administered 2015-12-17: 2 mg via INTRAVENOUS
  Administered 2015-12-17 – 2015-12-18 (×9): 3 mg via INTRAVENOUS
  Administered 2015-12-18: 2 mg via INTRAVENOUS
  Administered 2015-12-19 (×3): 3 mg via INTRAVENOUS
  Filled 2015-12-12 (×3): qty 2
  Filled 2015-12-12 (×2): qty 1
  Filled 2015-12-12: qty 2
  Filled 2015-12-12: qty 1
  Filled 2015-12-12: qty 2
  Filled 2015-12-12: qty 1
  Filled 2015-12-12: qty 2
  Filled 2015-12-12 (×8): qty 1
  Filled 2015-12-12: qty 2
  Filled 2015-12-12 (×9): qty 1
  Filled 2015-12-12 (×3): qty 2
  Filled 2015-12-12: qty 1
  Filled 2015-12-12: qty 2
  Filled 2015-12-12 (×6): qty 1
  Filled 2015-12-12 (×2): qty 2
  Filled 2015-12-12: qty 1
  Filled 2015-12-12 (×2): qty 2
  Filled 2015-12-12 (×2): qty 1
  Filled 2015-12-12: qty 2
  Filled 2015-12-12 (×3): qty 1
  Filled 2015-12-12: qty 2
  Filled 2015-12-12 (×3): qty 1
  Filled 2015-12-12 (×2): qty 2
  Filled 2015-12-12 (×3): qty 1

## 2015-12-12 MED ORDER — FAT EMULSION 20 % IV EMUL
240.0000 mL | INTRAVENOUS | Status: AC
  Administered 2015-12-12: 240 mL via INTRAVENOUS
  Filled 2015-12-12: qty 250

## 2015-12-12 NOTE — Progress Notes (Signed)
Rehab Admissions Coordinator Note:  Patient was screened by Retta Diones for appropriateness for an Inpatient Acute Rehab Consult.  At this time, we are recommending Inpatient Rehab consult.  Retta Diones 12/12/2015, 1:43 PM  I can be reached at [phone].

## 2015-12-12 NOTE — Progress Notes (Addendum)
Patient ID: [name] [name], male   DOB: 03-18-1951, 65 y.o.   MRN: [id number] [name] PROGRESS NOTE  [name] [name] [id number] DOB: 12/02/1950 DOA: 11/21/2015 PCP: Marijean Bravo, MD  Brief narrative:    65 year old very pleasant male with a past medical history of hyperlipidemia, hypertension, CAD, type 2 diabetes, diabetic peripheral neuropathy, seizure disorder, polysubstance abuse and chronic pain issues (on high doses of oxycodone prior to admission) who was admitted 11/22/15 with a small bowel obstruction. Surgical consultation was obtained on admission with conservative therapy initially recommended. Patient's bowel obstruction was felt to be from adhesions and possible narcotic bowel. His NG tube was removed 11/23/15 and he seemed to be improving clinically but pain returned 11/24/15 with diet advancement to clears. Repeat films showed worsening small bowel dilatation so his NG tube was reinserted. TPN was subsequently ordered. On 11/27/15, the patient refused PICC line insertion for TPN and surgical intervention. PICC finally placed 11/28/15 with initiation of TPN.   Further, patient has developed a right upper extremity DVT and currently is on IV heparin.  On 11/30/15, the patient's condition deteriorated acutely and he was found to be confused and restless. Rapid response was called. He was febrile and tachycardic with an elevated lactate level. Due to concerns for sepsis, broad-spectrum antibiotics were initiated with vancomycin and Primaxin which was switched to cefepime given his seizure history. Patient uderwent exploratory laparotomy on 12/01/15 with findings of perforated viscus with peritoneal contamination/peritonitis. He remained intubated postoperatively and was under the care of the critical care team. He self extubated 12/02/15. Surgery continues to follow, patient has an open abdominal wound. He continues to have an ileus and is on TNA at this time. [name]  assumed care 12/05/15.  Transferred to telemetry floor 12/08/2015.  Barrier to discharge: Slow improvement, ongoing pain, on TNA. Unable to predict when he will be ready for discharge but possible early next week.  Assessment/Plan:    Principal problem: SBO w/ perforated viscous and resultant peritonitis / leukocytosis / sepsis - Secondary to adhesions versus narcotic bowel. Patient initially declined surgical intervention but then since pain did not improve he finally underwent exploratory laparotomy 12/01/2015 - We will continue nutritional support with TPN - His white blood cell count was up to 17.6 on 12/08/2015 and  CT scan of abdomen and pelvis 12/07/2015 demonstrated moderate loculated perihepatic/perisplenic/pelvic ascites and surgery requested interventional radiology consultation for CT-guided drainage of perihepatic fluid collection - IR placed the drain from fluid collection from right perihepatic site. Drain is also in the right lower quadrant and drain also placed in left upper quadrant. There was also fluid collection in the left pelvic area and 60 mL fluid was withdrawn from the catheter but no drain was placed. - Finally his WBC count is steadily improving  - He was on Cefepime and flagyl. Cefepime stopped 12/10/2015 and currently on flagyl only.  - Continue pain management efforts  Active problems:  Severe Protein calorie malnutrition / underweight - In the context of acute on chronic illness - Clears if he tolerates it  - Continue nutritional support with TPN  Acute upper extremity DVT - US DVT right distal axillary vein, subclavian vein.  - Continue heparin drip - No reports of bleeding  Post operative respiratory failure with hypoxemia - Patient required ventilatory support after exploratory laparotomy 12/01/2015.  - Patient self extubated 12/02/2015 - Respiratory status has improved with Lasix 80 mg IV given 12/05/2015 - Respiratory status continues to be stable  Acute Encephalopathy in setting of sepsis / history of seizure disorder - EEG done 11/30/2015 showed no acute seizures - Load with Keppra if seizures occur but so far no seizures at all  Acute kidney injury - In the setting of sepsis and small bowel obstruction with perforated viscus - Creatinine normalized with hydration  Anemia of chronic disease / thrombocytopenia - Related to history of substance and alcohol use  - Hemoglobin stable at 9.2 and platelet count is now within normal limits  Controlled, diabetes mellitus type 2 with peripheral circulatory complications without long-term insulin use - CBGs in past 24 hours: 95, 92, 80 - Continue sliding scale insulin - CBG in 10/2015 with A1c less than 7  Left lower extremity wound / S/p left fem pop bypass w/ non-healing ulcer - WOC assessment done   DVT Prophylaxis  - SCD's bilaterally in hospital   Code Status: Full.  Family Communication:  plan of care discussed with the patient, family not at the bedside this am Disposition Plan: unable to predict when pt can be discharged, slowly improving   IV access:  Central line   Procedures and diagnostic studies from 12/05/2015:  Ct Abdomen Pelvis Wo Contrast 12/07/2015   1. There is small bilateral pleural effusion right greater than left. Significant atelectasis or infiltrate in right lower lobe posteriorly. There is superimposed mild interstitial prominence bilateral lower lobes probable mild interstitial edema. 2. There is moderate perihepatic and perisplenic ascites. Moderate ascites noted bilateral paracolic gutters. Please note there is some loculation of perihepatic ascites with mild mass effect on the liver contour please see axial images 32 and 15. Peritoneal inflammation or infection cannot be excluded. 3. No hydronephrosis or hydroureter.  Stable bilateral renal cysts. 4. Postsurgical changes are noted anterior abdominal wall. Incomplete healed/open subcutaneous midline anterior  abdominal wound. 5. Mild distended small bowel loops containing oral contrast material and some air-fluid levels. There is no transition point in caliber of small bowel. Findings most likely due to significant ileus or residual partial small bowel obstruction. There is some oral contrast material noted within distal colon. 6. Moderate pelvic ascites. Moderate gas noted within mid sigmoid colon probable mild ileus. 7. Significant anasarca infiltration of subcutaneous fat abdominal and pelvic wall. 8. 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 Electronically Signed   By: Lahoma Crocker M.D.   On: 12/07/2015 15:18   Dg Chest Port 1 View 12/06/2015  Stable bilateral pulmonary edema, with right basilar subsegmental atelectasis and associated pleural effusion. Interval placement of left internal jugular catheter with distal tip in SVC. No pneumothorax is noted. Electronically Signed   By: Marijo Conception, M.D.   On: 12/06/2015 16:10   Dg Chest Port 1 View  12/05/2015   Worsening of pulmonary interstitial infiltrates bilaterally. This may reflect pulmonary edema of cardiac or noncardiac cause or could reflect pneumonia. There are small bilateral pleural effusions. Electronically Signed   By: David  Martinique M.D.   On: 12/05/2015 08:52   Exploratory laparotomy on 12/01/15   Medical Consultants:  Surgery  CCM transfer care to [facility] PCT 12/08/2015  Other Consultants:  Nutrition PT  IAnti-Infectives:   Flagyl 11-30-2015 --> Cefepime 12-02-2015 --> 12/10/2015    Leisa Lenz, MD  [name] Pager [phone]  Time spent in minutes: 25 minutes  If 7PM-7AM, please contact night-coverage [email or web address] Password [email or web address] 12/12/2015, 11:18 AM   LOS: 21 days    HPI/Subjective: No acute overnight events. Patient reports pain is 7/10 this am.  Objective: Filed Vitals:   12/11/15 0445 12/11/15 1413 12/11/15 2101 12/12/15 0357  BP: 136/83 140/76 96/83 134/68  Pulse: 85 83 88 80  Temp: 98.1  F (36.7 C) 98.4 F (36.9 C) 98.5 F (36.9 C) 99.1 F (37.3 C)  TempSrc: Oral Oral Oral Oral  Resp: 18 17 20 18   Height:      Weight:      SpO2: 100% 96% 94% 95%    Intake/Output Summary (Last 24 hours) at 12/12/15 1118 Last data filed at 12/12/15 0438  Gross per 24 hour  Intake     60 ml  Output   1400 ml  Net  -1340 ml    Exam:   General:  Pt is alert, no distress  Cardiovascular: Rate controlled, (+) S1, S2   Respiratory:  No wheezing, no rhonchi  Abdomen: Has 3 JP drains, 2 on right side and one on left, tender to palpation, no rebound tenderness, appreciate bowel sounds  Extremities: No edema right lower extremity, has left unaboot (+)  Neuro:  No focal deficits   Data Reviewed: Basic Metabolic Panel:  Recent Labs Lab 12/06/15 0610 12/07/15 0445 12/08/15 0340 12/10/15 1310 12/11/15 0518 12/12/15 0645  NA 140 141 137 138 138 135  K 4.1 4.0 3.9 3.6 3.7 3.8  CL 108 110 104 101 104 101  CO2 24 24 26 29 27 27   GLUCOSE 134* 140* 151* 116* 141* 82  BUN 49* 51* 52* 40* 38* 36*  CREATININE 1.92* 1.89* 1.68* 1.30* 1.17 1.28*  CALCIUM 8.5* 8.4* 8.3* 8.3* 8.2* 8.1*  MG 1.8 1.6*  --   --  1.5* 1.8  PHOS  --  4.1  --   --  3.2 3.6   Liver Function Tests:  Recent Labs Lab 12/07/15 0445 12/11/15 0518  AST 27 21  ALT 14* 11*  ALKPHOS 72 74  BILITOT 0.9 0.9  PROT 5.3* 5.8*  ALBUMIN 1.8* 1.9*   No results for input(s): LIPASE, AMYLASE in the last 168 hours. No results for input(s): AMMONIA in the last 168 hours. CBC:  Recent Labs Lab 12/07/15 0445 12/08/15 0340 12/10/15 1310 12/11/15 0518 12/12/15 0645  WBC 15.9* 17.6* 14.4* 13.5* 13.5*  NEUTROABS  --   --   --  10.8*  --   HGB 10.0* 10.8* 8.4* 9.0* 9.2*  HCT 29.5* 31.7* 25.3* 26.8* 28.1*  MCV 85.0 85.4 86.3 85.6 87.0  PLT 120* 129* 254 309 343   Cardiac Enzymes: No results for input(s): CKTOTAL, CKMB, CKMBINDEX, TROPONINI in the last 168 hours. BNP: Invalid input(s): POCBNP CBG:  Recent  Labs Lab 12/11/15 1649 12/11/15 2107 12/11/15 2347 12/12/15 0400 12/12/15 0755  GLUCAP 81 86 95 92 80    Recent Results (from the past 240 hour(s))  MRSA PCR Screening     Status: None   Collection Time: 12/04/15  9:57 AM  Result Value Ref Range Status   MRSA by PCR NEGATIVE NEGATIVE Final    Comment:        The GeneXpert MRSA Assay (FDA approved for NASAL specimens only), is one component of a comprehensive MRSA colonization surveillance program. It is not intended to diagnose MRSA infection nor to guide or monitor treatment for MRSA infections.   Culture, routine-abscess     Status: None   Collection Time: 12/08/15 12:41 PM  Result Value Ref Range Status   Specimen Description ABSCESS RLQ  Final   Special Requests NONE  Final   Gram Stain   Final  FEW WBC PRESENT,BOTH PMN AND MONONUCLEAR NO SQUAMOUS EPITHELIAL CELLS SEEN NO ORGANISMS SEEN Performed at [facility]    Culture   Final    NO GROWTH 2 DAYS Performed at [facility]    Report Status 12/10/2015 FINAL  Final  Anaerobic culture     Status: None (Preliminary result)   Collection Time: 12/08/15 12:41 PM  Result Value Ref Range Status   Specimen Description ABSCESS RLQ  Final   Special Requests NONE  Final   Gram Stain PENDING  Incomplete   Culture   Final    NO ANAEROBES ISOLATED; CULTURE IN PROGRESS FOR 5 DAYS Performed at [facility]    Report Status PENDING  Incomplete  Culture, routine-abscess     Status: None   Collection Time: 12/08/15 12:41 PM  Result Value Ref Range Status   Specimen Description ABSCESS PERIHEPATIC RUQ   Final   Special Requests NONE  Final   Gram Stain   Final    FEW WBC PRESENT, PREDOMINANTLY MONONUCLEAR NO SQUAMOUS EPITHELIAL CELLS SEEN NO ORGANISMS SEEN Performed at [facility]    Culture   Final    NO GROWTH 2 DAYS Performed at [facility]    Report Status 12/10/2015 FINAL  Final  Anaerobic culture     Status: None  (Preliminary result)   Collection Time: 12/08/15 12:41 PM  Result Value Ref Range Status   Specimen Description ABSCESS PERIHEPATIC RUQ  Final   Special Requests NONE  Final   Gram Stain PENDING  Incomplete   Culture   Final    NO ANAEROBES ISOLATED; CULTURE IN PROGRESS FOR 5 DAYS Performed at [facility]    Report Status PENDING  Incomplete  Culture, routine-abscess     Status: None   Collection Time: 12/08/15 12:41 PM  Result Value Ref Range Status   Specimen Description ABSCESS LUQ  Final   Special Requests NONE  Final   Gram Stain   Final    ABUNDANT WBC PRESENT,BOTH PMN AND MONONUCLEAR NO SQUAMOUS EPITHELIAL CELLS SEEN NO ORGANISMS SEEN Performed at [facility]    Culture   Final    NO GROWTH 2 DAYS Performed at [facility]    Report Status 12/10/2015 FINAL  Final  Anaerobic culture     Status: None (Preliminary result)   Collection Time: 12/08/15 12:41 PM  Result Value Ref Range Status   Specimen Description ABSCESS LUQ  Final   Special Requests NONE  Final   Gram Stain PENDING  Incomplete   Culture   Final    NO ANAEROBES ISOLATED; CULTURE IN PROGRESS FOR 5 DAYS Performed at [facility]    Report Status PENDING  Incomplete     Scheduled Meds: . antiseptic oral rinse  7 mL Mouth Rinse q12n4p  . bacitracin   Topical Daily  . chlorhexidine  15 mL Mouth Rinse BID  . collagenase  1 application Topical Daily  . insulin aspart  0-15 Units Subcutaneous 6 times per day  . lip balm  1 application Topical BID  . metronidazole  500 mg Intravenous Q6H  . sodium chloride  3 mL Intravenous Q12H  . thiamine  100 mg Oral Daily   Continuous Infusions: . Marland KitchenTPN (CLINIMIX-E) Adult 65 mL/hr at 12/11/15 1739   And  . fat emulsion 240 mL (12/11/15 1739)  . Marland KitchenTPN (CLINIMIX-E) Adult     And  . fat emulsion    . heparin 2,500 Units/hr (  12/12/15 1108)  . lactated ringers 80 mL/hr at 12/12/15 1109

## 2015-12-12 NOTE — Progress Notes (Signed)
11 Days Post-Op  Subjective: He is more coherent this AM, and totally focused on pain issues. Abdominal pain, with broth so he got dilaudid Dilaudid 5 mg yesterday, and 5 mg so far this AM also 3 doses of Toradol yesterday and one this AM. He has pain with broth and with flatus and wants pain med.  He is still hypoactive, but has some BS.  He has not been walking.   #2 drain is cloudy this AM.  PT said he was really weak and walking is being reassessed today. Objective: Vital signs in last 24 hours: Temp:  [98.4 F (36.9 C)-99.1 F (37.3 C)] 99.1 F (37.3 C) (01/17 0357) Pulse Rate:  [80-88] 80 (01/17 0357) Resp:  [17-20] 18 (01/17 0357) BP: (96-140)/(68-83) 134/68 mmHg (01/17 0357) SpO2:  [94 %-96 %] 95 % (01/17 0357) Last BM Date: 11/17/15 Urine 1250 Diet: clears 60 recorded No BM recorded Afebrile, VSS Creatinine is up this AM, 1.28.  WBC remain at 13.5 Intake/Output from previous day: 01/16 0701 - 01/17 0700 In: 60  Out: 1400 [Urine:1250; Drains:150] Intake/Output this shift:    General appearance: alert, cooperative and no distress GI: soft, wound looks fine, BS are hypoactive.  he is entirely focused on his pain this AM  Lab Results:   Recent Labs  12/11/15 0518 12/12/15 0645  WBC 13.5* 13.5*  HGB 9.0* 9.2*  HCT 26.8* 28.1*  PLT 309 343    BMET  Recent Labs  12/11/15 0518 12/12/15 0645  NA 138 135  K 3.7 3.8  CL 104 101  CO2 27 27  GLUCOSE 141* 82  BUN 38* 36*  CREATININE 1.17 1.28*  CALCIUM 8.2* 8.1*   PT/INR No results for input(s): LABPROT, INR in the last 72 hours.   Recent Labs Lab 12/07/15 0445 12/11/15 0518  AST 27 21  ALT 14* 11*  ALKPHOS 72 74  BILITOT 0.9 0.9  PROT 5.3* 5.8*  ALBUMIN 1.8* 1.9*     Lipase     Component Value Date/Time   LIPASE 21 11/21/2015 1621     Studies/Results: No results found.  Medications: . antiseptic oral rinse  7 mL Mouth Rinse q12n4p  . bacitracin   Topical Daily  . chlorhexidine  15  mL Mouth Rinse BID  . collagenase  1 application Topical Daily  . insulin aspart  0-15 Units Subcutaneous 6 times per day  . lip balm  1 application Topical BID  . metronidazole  500 mg Intravenous Q6H  . sodium chloride  3 mL Intravenous Q12H  . thiamine  100 mg Oral Daily   Or  . thiamine  100 mg Intravenous Daily   . Marland KitchenTPN (CLINIMIX-E) Adult 65 mL/hr at 12/11/15 1739   And  . fat emulsion 240 mL (12/11/15 1739)  . heparin 2,500 Units/hr (12/11/15 2154)  . lactated ringers 80 mL/hr at 12/11/15 [id number]    Assessment/Plan SBO day 10 before he accepted surgery Perforation of the small intestine with peritonitis status post radical peritoneal debridement, enterolysis, 12/03/15, Dr. Hassell Done Sepsis/ CT guided abscess drainage x 4 12/08/15 DR. Kathlene Cote, IR Post op ileus DVT RUE on Heparin drip Atelectasis, and pleural effusion, possible pneumonia Chronic constipation on pain medications Encephalopathy/confusion now appears resolved/focused on pain issue Mild renal insuffiencey - creatinine is back to normal this AM 12/11/15.  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 CAD s/p NSTEMI 05/2013 WITH stendts to RCA Ischemic cardiomyopathy  S/p iliac artery stent Left foot ulcers  AODM Diabetic neuropathy Arthritis  Tobacco use Antibiotics: 6 days of vancomycin/ 7 days of Cefepime/Day 12 Flagyl (penicillin allergy reported) DVT: currently on SCD and heparin drip.  Plan:  He has had some discharge from rectum, he says it's not normal stool.  I am going to try full liquids.  i would try him back on some oral pain meds and we can see how he does,  Continue antibiotics for abscesses.  PT is seeing and going to try to get him up walking.  He did voice allot of fears with everything so he take pain med.      LOS: 21 days    Shakenya Stoneberg 12/12/2015

## 2015-12-12 NOTE — Progress Notes (Signed)
Thank you for consult on [name] [name].  Note that he lives alone in an apartment and currently continues to have  profound BLE weakness with limited mobility. He has issues with compliance as well as multiple medical issues and doubt ability to reach independent level for safe discharge to home.  He does not show ability to tolerate 3 hours/day of therapy at this time and recommend continue pursue of SNF for therapy after discharge.

## 2015-12-12 NOTE — Progress Notes (Signed)
ANTICOAGULATION CONSULT NOTE - F/u Consult  Pharmacy Consult for heparin Indication: DVT  Allergies  Allergen Reactions  . Zetia [Ezetimibe] Anaphylaxis and Swelling    Tongue and throat   . Atorvastatin Other (See Comments)    Malaise & muscle weakness  . Penicillins Other (See Comments)    Unknown.  Tolerates cefepime  . Pravastatin Sodium     Pravastatin 40 mg qday and 40 mg q M/W/F caused muscle aches  . Lisinopril Rash  . Promethazine Rash   Patient Measurements: Height: 6\' 2"  (188 cm) Weight: 157 lb 10.1 oz (71.5 kg) IBW/kg (Calculated) : 82.2 Heparin Dosing Weight: 72kg  Vital Signs: Temp: 99.1 F (37.3 C) (01/17 0357) Temp Source: Oral (01/17 0357) BP: 134/68 mmHg (01/17 0357) Pulse Rate: 80 (01/17 0357)  Labs:  Recent Labs  12/10/15 1310  12/11/15 0518 12/11/15 2118 12/12/15 0645  HGB 8.4*  --  9.0*  --  9.2*  HCT 25.3*  --  26.8*  --  28.1*  PLT 254  --  309  --  343  HEPARINUNFRC 0.22*  < > 0.46 0.52 0.60  CREATININE 1.30*  --  1.17  --  1.28*  < > = values in this interval not displayed.  Estimated Creatinine Clearance: 59.7 mL/min (by C-G formula based on Cr of 1.28).  Assessment: 70 YOM known to pharmacy for TPN management and antibiotic dosing.  He is s/p ex lap on 1/6 and found to have perforated viscous and peritonitis. RUE doppler 1/11 reveals DVT of RUE along PICC site. PICC removed 1/11 and Left IJ placed.   Significant events: 1/11 baseline INR = 1.89, aPTT = 45sec 1/13 heparin held in AM for CT guided drain abscess drainage x 4 areas of fluid collection.  Heparin infusion resumed at previous rate (1800 units/hr) this afternoon at 1400 per IR instruction. Heparin level therapeutic after resumption of Heparin (0.40)  Today, 12/12/2015   Heparin level continues to be therapeutic on current rate of 2500 units/hr  CBC: stable.  No bleeding reported/documented.  AKI - SCr up a little - CrCl 60  Goal of Therapy:  Heparin level 0.3-0.7  units/ml Monitor platelets by anticoagulation protocol: Yes   Plan:   Continue heparin infusion at current rate of 2500 units/hr  Daily CBC and heparin level  Awaiting plan for long term anticoagulation   Adrian Saran, PharmD, BCPS Pager [phone] 12/12/2015 9:47 AM

## 2015-12-12 NOTE — Progress Notes (Signed)
Nutrition Follow-up  DOCUMENTATION CODES:   Not applicable  INTERVENTION:  - Continue TPN per pharmacy - Continue CLD and provide encouragement at meals - RD will continue to monitor for needs and adjustments to PO diet  NUTRITION DIAGNOSIS:   Inadequate oral intake related to altered GI function as evidenced by other (see comment) (TPN with supplemental CLD ). -revised, ongoing  GOAL:   Patient will meet greater than or equal to 90% of their needs -met with TPN regimen  MONITOR:   PO intake, Weight trends, Labs, Skin, I & O's, Other (Comment) (TPN regimen)  ASSESSMENT:   65 year old male with a past medical history of hyperlipidemia, hypertension, CAD, type 2 diabetes, diabetic peripheral neuropathy, seizure disorder, polysubstance abuse and chronic pain issues (on high doses of oxycodone prior to admission) who was admitted 11/22/15 with a small bowel obstruction. Surgical consultation was obtained on admission with conservative therapy initially recommended. Patient's bowel obstruction was felt to be from adhesions and possible narcotic bowel. His NG tube was removed 11/23/15 and he seemed to be improving clinically but pain returned 11/24/15 with diet advancement to clears. Repeat films showed worsening small bowel dilatation so his NG tube was reinserted. TPN was subsequently ordered. On 11/27/15, the patient refused PICC line insertion for TPN and surgical intervention. PICC finally placed 11/28/15 with initiation of TPN.   1/17 Pt has been on CLD since 1/14 with no documented intakes. Breakfast tray on bedside table at time of visit and pt had consumed 100% of chicken broth and all other items were untouched. Pt reports this is the most he has consumed PO in 5 weeks. Pt states severe abdominal pain following intake this AM. Encouraged pt to consume something PO at each meal, even if he is only able to consume 1 spoonful. Pt states ongoing abdominal pain both with and without PO  intakes.  Pt currently receiving Clinimix E 5/15 @ 65 mL/hr with 20% lipids @ 10 mL/hr which is providing 1588 kcal (99% minimum estimated kcal needs) and 78 grams of protein.  Per pharmacy note today at [phone]:  Continue Clinimix E 5/15 at goal rate of 65 ml/h  Reduce insulin from 20 units over 24hr to 10 units over 24hrs  Continue 20% fat emulsion at 10 ml/hr  Standard MVI, MTE in TPN bag.  Continue q6h CBGs with moderate correction insulin.  TPN lab panels on Mondays & Thursdays. BMET, mag, phos in AM.   Pt meeting needs with TPN regiment alone. Will monitor PO intakes and possible diet advancement in the future per surgery. Medications reviewed. Labs reviewed; CBGs: 70-138 mg/dL, BUN/creatinine elevated, Ca: 8.1 mg/dL.    1/12 - RD attempted to see patient to complete NFPE.  - Tech outside of room let RD know pt is on the bedpan. Will attempt at follow-up. - Pt continues on TPN an diet has been advanced to full liquids.  - Plan per Pharmacy:  Continue Clinimix E 5/15 at goal rate of 65 ml/h  Continue 20% fat emulsion at 10 ml/hr  Diet Order:  Diet clear liquid Room service appropriate?: Yes; Fluid consistency:: Thin TPN (CLINIMIX-E) Adult TPN (CLINIMIX-E) Adult  Skin:  Wound (see comment) (L foot and toe DM ulcers)  Last BM:  1/11  Height:   Ht Readings from Last 1 Encounters:  12/01/15 _0  (1.88 m)    Weight:   Wt Readings from Last 1 Encounters:  12/08/15 157 lb 10.1 oz (71.5 kg)    Ideal Body Weight:  86.36 kg (kg)  BMI:  Body mass index is 20.23 kg/(m^2).  Estimated Nutritional Needs:   Kcal:  1600-1800  Protein:  65-80 grams  Fluid:  >/= 2 L/day  EDUCATION NEEDS:   No education needs identified at this time     Jarome Matin, RD, LDN Inpatient Clinical Dietitian Pager # [phone] After hours/weekend pager # [phone]

## 2015-12-12 NOTE — Progress Notes (Signed)
Physical Therapy Treatment Patient Details Name: THAER GONGWER MRN: [id number] DOB: 20-Jun-1951 Today's Date: 12/12/2015    History of Present Illness 65 year old aam who was admitted on 12/27 w/ abd pain found to be 2/2 SBO on CT scan. Marland Kitchen His hospital course was complicated by fever spike and delirium on 1/5. At that time has abd was more distended.  He went for exploratory lap 12/01/15 which was notable for significant peritoneal contamination. He is s/p exploration, washout and repair of perforated viscous    PT Comments    Patient continues to present with profound weakness of both legs, do not note any quad activity, unable to contract. Patient is able to flex the legs on the bed with noted hamstrings, L appears better than Right, unable to actively slide the legs out once in hooklying/flexed  Position  On the bed in supine. Dr. Charlies Silvers notified of the findings. Would not attempt standing. Appears patient slid to drop arm recliner yesterday. Recommend cir consult.  Follow Up Recommendations  SNF;Supervision/Assistance - 24 hour/ CIR consult.     Equipment Recommendations  Wheelchair (measurements PT)    Recommendations for Other Services       Precautions / Restrictions Precautions Precaution Comments: , profound Leg weakness, does not  have quad strength.    Mobility  Bed Mobility Overal bed mobility: Needs Assistance Bed Mobility: Supine to Sit;Sit to Supine     Supine to sit: Min assist Sit to supine: Min assist   General bed mobility comments: manages the legs with ue's but legs will flop the edge due to  no quad strength. Self assisted L eg onto bed with hands but needed assistance  for the r ight leg onto the bed.  Transfers                    Ambulation/Gait                 Stairs            Wheelchair Mobility    Modified Rankin (Stroke Patients Only)       Balance Overall balance assessment: History of Falls;Needs  assistance Sitting-balance support: Feet supported;Bilateral upper extremity supported Sitting balance-Leahy Scale: Good         Standing balance comment: NT due to  poor strength test of quads, bilaterally.                    Cognition Arousal/Alertness: Awake/alert Behavior During Therapy:  (emotional, speaking about pain)                   General Comments: much improved in MS, engaging.    Exercises      General Comments        Pertinent Vitals/Pain Pain Score: 9  Faces Pain Scale: Hurts whole lot Pain Location: abd Pain Descriptors / Indicators: Tightness;Discomfort Pain Intervention(s): Premedicated before session;Repositioned    Home Living                      Prior Function            PT Goals (current goals can now be found in the care plan section) Progress towards PT goals: Not progressing toward goals - comment (profound weakness for standing, improved in mobility.)    Frequency  Min 3X/week    PT Plan Current plan remains appropriate    Co-evaluation  End of Session   Activity Tolerance: Patient tolerated treatment well Patient left: in bed;with call bell/phone within reach;with bed alarm set     Time: 1001-1046 PT Time Calculation (min) (ACUTE ONLY): 45 min  Charges:  $Therapeutic Activity: 38-52 mins                    G Codes:      Claretha Cooper 12/12/2015, 10:53 AM Tresa Endo PT [phone]

## 2015-12-12 NOTE — Progress Notes (Signed)
PARENTERAL NUTRITION CONSULT NOTE - FOLLOW-UP  Pharmacy Consult for TPN Indication: Bowel obstruction, post op ileus  Allergies  Allergen Reactions  . Zetia [Ezetimibe] Anaphylaxis and Swelling    Tongue and throat   . Atorvastatin Other (See Comments)    Malaise & muscle weakness  . Penicillins Other (See Comments)    Unknown.  Tolerates cefepime  . Pravastatin Sodium     Pravastatin 40 mg qday and 40 mg q M/W/F caused muscle aches  . Lisinopril Rash  . Promethazine Rash   Patient Measurements: Height: 6\' 2"  (188 cm) Weight: 157 lb 10.1 oz (71.5 kg) IBW/kg (Calculated) : 82.2 Usual Weight: 137-140 lbs  Vital Signs: Temp: 99.1 F (37.3 C) (01/17 0357) Temp Source: Oral (01/17 0357) BP: 134/68 mmHg (01/17 0357) Pulse Rate: 80 (01/17 0357) Intake/Output from previous day: 01/16 0701 - 01/17 0700 In: 60  Out: 1400 [Urine:1250; Drains:150] Intake/Output from this shift:   Labs:  Recent Labs  12/10/15 1310 12/11/15 0518 12/12/15 0645  WBC 14.4* 13.5* 13.5*  HGB 8.4* 9.0* 9.2*  HCT 25.3* 26.8* 28.1*  PLT 254 309 343    Recent Labs  12/10/15 1310 12/11/15 0518 12/12/15 0645  NA 138 138 135  K 3.6 3.7 3.8  CL 101 104 101  CO2 29 27 27   GLUCOSE 116* 141* 82  BUN 40* 38* 36*  CREATININE 1.30* 1.17 1.28*  CALCIUM 8.3* 8.2* 8.1*  MG  --  1.5* 1.8  PHOS  --  3.2 3.6  PROT  --  5.8*  --   ALBUMIN  --  1.9*  --   AST  --  21  --   ALT  --  11*  --   ALKPHOS  --  74  --   BILITOT  --  0.9  --   PREALBUMIN  --  2.7*  --   TRIG  --  95  --    Estimated Creatinine Clearance: 59.7 mL/min (by C-G formula based on Cr of 1.28).    Recent Labs  12/11/15 2347 12/12/15 0400 12/12/15 0755  GLUCAP 95 92 80   Medical History: Past Medical History  Diagnosis Date  . Hyperlipidemia   . Hypertension   . Leg pain   . Peripheral vascular disease ([place])     s/p prior Ao-Bifem BPG  . 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 Coronary artery disease     a. NSTEMI 7/14=> LHC 06/07/13: Proximal LAD 30%, ostial D1 30%, AV circumflex 80%, then occluded, ostial OM2 occluded, left to left collaterals, mid RCA 95%, inferior HK, EF 60%. PCI: Promus DES to the mid RCA.  No intervention was recommended for the CTO circumflex.  . Ischemic cardiomyopathy     a. Echocardiogram 06/06/13: EF 35-40%, inferior AK, mild MR.  Marland Kitchen Shortness of breath     "can come on at anytime lately; before that it was just w/exertion" (11/10/2013)  . Type II diabetes mellitus ([place])   . [place] mal seizure [facility])     "last one was ~ 3 yr ago; controlled w/daily RX" (11/10/2013)  . Arthritis     "neck, back, legs" (11/10/2013)  . Kidney stones     "when I was younger; passed w/o treatment" (11/10/2013)  . Myocardial infarction ([place])   . Diabetic neuropathy ([place])   . H. pylori infection   . Wears glasses   . NSTEMI (non-ST elevated myocardial infarction) (  [place]) 06/05/2013  . Shoulder contusion 11/07/2013   Medications:  Infusions:  . Marland KitchenTPN (CLINIMIX-E) Adult 65 mL/hr at 12/11/15 1739   And  . fat emulsion 240 mL (12/11/15 1739)  . heparin 2,500 Units/hr (12/11/15 2154)  . lactated ringers 80 mL/hr at 12/11/15 0233   Insulin Requirements in the past 24 hours: 2 units SSI/24h + 20 units Regular insulin in TPN  Current Nutrition: NPO for abscess aspiration/drain placement, some CL  IVF: LR 80 ml/hr  Central access: PICC placed 1/3 TPN start date: 1/3  ASSESSMENT                                                                                                          HPI: 36 yoM presented to ED on 12/27 with abdominal pain.  PMH includes HTN, HLD, PVD - s/p fem bypass, CAD s/p NSTEMI 2014, cardiomyopathy, DM, diabetic peripheral neuropathy, seizures, and chronic pain on chronic opioids.  Surgical history significant for open AAA repair.  CT shows partial SBO, likely adhesions.  Patient refused surgery, until 1/6 s/p repair of  perforated visous.  Plan for conservative management with TPN, bowel rest.   Significant events:  1/1: TPN per pharmacy ordered - unable to start without central access.  1/3: PICC line placed; TPN started 1/5: Acute encephalopathy with fevers (102F) overnight- sepsis protocol initiated.  Broad-spectrum abx started.  1/6: Consented to laparotomy w/ findings of perforated viscus and peritonitis. Txfer to ICU   1/10: NGT removed 1/11: started clear liquid diet 1/13 NPO again for CT guided drain placement for perihepatic fluid collection suspicious for abscess. FL diet post-procedure 1/16: per CCS, no bowel sounds noted today, patient needs to be walked as much as possible  Today 12/12/2015:   Glucose: at goal but trending downward with insulin in TPN. Hx DM on glipizide PTA.   Electrolytes - WNL  Renal - AKI; SCr trending down.  UOP increased 1.4L, 0.7 ml/kg/hr  I/O: -1340 mL   Drains x3: output 150 ml  LFTs - AST/ALT and Tbili ok   TGs -  (Hx of HPL) 124 (1/2), 164 (1/9), 95 (1/16)  Prealbumin - 3.7 (1/2), < 2 (1/9), 2.7 (1/16)  NUTRITIONAL GOALS                                                                                             RD recs: 1600-1800 Kcal/day, 65-80 g protein/day, > 2 L fluid/day  Clinimix E 5/15 at a goal rate of 65 ml/hr + 20% fat emulsion at 10 ml/hr to provide: 78 g/day protein, 1588 Kcal/day.  PLAN  Continue Clinimix E 5/15 at goal rate of 65 ml/h  Reduce insulin from 20 units over 24hr to 10 units over 24hrs  Continue 20% fat emulsion at 10 ml/hr  Standard MVI, MTE in TPN bag.  Continue q6h CBGs with moderate correction insulin.  TPN lab panels on Mondays & Thursdays.  BMET, mag, phos in AM.   Adrian Saran, PharmD, BCPS Pager [phone] 12/12/2015 9:55 AM

## 2015-12-13 LAB — ANAEROBIC CULTURE

## 2015-12-13 LAB — CBC
HEMATOCRIT: 25.4 % — AB (ref 39.0–52.0)
Hemoglobin: 8.3 g/dL — ABNORMAL LOW (ref 13.0–17.0)
MCH: 28 pg (ref 26.0–34.0)
MCHC: 32.7 g/dL (ref 30.0–36.0)
MCV: 85.8 fL (ref 78.0–100.0)
Platelets: UNDETERMINED 10*3/uL (ref 150–400)
RBC: 2.96 MIL/uL — ABNORMAL LOW (ref 4.22–5.81)
RDW: 14.9 % (ref 11.5–15.5)
WBC: 11.7 10*3/uL — ABNORMAL HIGH (ref 4.0–10.5)

## 2015-12-13 LAB — BASIC METABOLIC PANEL
Anion gap: 9 (ref 5–15)
BUN: 30 mg/dL — AB (ref 6–20)
CHLORIDE: 101 mmol/L (ref 101–111)
CO2: 25 mmol/L (ref 22–32)
CREATININE: 1.19 mg/dL (ref 0.61–1.24)
Calcium: 8.1 mg/dL — ABNORMAL LOW (ref 8.9–10.3)
GFR calc non Af Amer: >60 mL/min (ref >60)
GLUCOSE: 119 mg/dL — AB (ref 65–99)
Potassium: 4.4 mmol/L (ref 3.5–5.1)
Sodium: 135 mmol/L (ref 135–145)

## 2015-12-13 LAB — GLUCOSE, CAPILLARY
GLUCOSE-CAPILLARY: 111 mg/dL — AB (ref 65–99)
GLUCOSE-CAPILLARY: 144 mg/dL — AB (ref 65–99)
Glucose-Capillary: 127 mg/dL — ABNORMAL HIGH (ref 65–99)
Glucose-Capillary: 126 mg/dL — ABNORMAL HIGH (ref 65–99)
Glucose-Capillary: 157 mg/dL — ABNORMAL HIGH (ref 65–99)

## 2015-12-13 LAB — HEPARIN LEVEL (UNFRACTIONATED): Heparin Unfractionated: 0.53 IU/mL (ref 0.30–0.70)

## 2015-12-13 LAB — MAGNESIUM: MAGNESIUM: 1.5 mg/dL — AB (ref 1.7–2.4)

## 2015-12-13 LAB — PHOSPHORUS: Phosphorus: 3.3 mg/dL (ref 2.5–4.6)

## 2015-12-13 MED ORDER — FAT EMULSION 20 % IV EMUL
240.0000 mL | INTRAVENOUS | Status: AC
  Administered 2015-12-13: 240 mL via INTRAVENOUS
  Filled 2015-12-13: qty 250

## 2015-12-13 MED ORDER — TRACE MINERALS CR-CU-MN-SE-ZN 10-1000-500-60 MCG/ML IV SOLN
INTRAVENOUS | Status: AC
  Administered 2015-12-13: 18:00:00 via INTRAVENOUS
  Filled 2015-12-13: qty 1560

## 2015-12-13 MED ORDER — MAGNESIUM SULFATE IN D5W 10-5 MG/ML-% IV SOLN
1.0000 g | Freq: Once | INTRAVENOUS | Status: AC
  Administered 2015-12-13: 1 g via INTRAVENOUS
  Filled 2015-12-13: qty 100

## 2015-12-13 NOTE — Progress Notes (Signed)
Please see note from yesterday.  Pt is unable to tolerate 3 hours therapy/day and will unlikely be able to reach a Mod I level of functioning after a short IRF stay.  Would search for alternative SNF.

## 2015-12-13 NOTE — Progress Notes (Signed)
ANTICOAGULATION CONSULT NOTE - F/u Consult  Pharmacy Consult for heparin Indication: DVT  Allergies  Allergen Reactions  . Zetia [Ezetimibe] Anaphylaxis and Swelling    Tongue and throat   . Atorvastatin Other (See Comments)    Malaise & muscle weakness  . Penicillins Other (See Comments)    Unknown.  Tolerates cefepime  . Pravastatin Sodium     Pravastatin 40 mg qday and 40 mg q M/W/F caused muscle aches  . Lisinopril Rash  . Promethazine Rash   Patient Measurements: Height: 6\' 2"  (188 cm) Weight: 157 lb 10.1 oz (71.5 kg) IBW/kg (Calculated) : 82.2 Heparin Dosing Weight: 72kg  Vital Signs: Temp: 98.8 F (37.1 C) (01/18 0653) Temp Source: Oral (01/18 0653) BP: 140/79 mmHg (01/18 0418) Pulse Rate: 77 (01/18 0418)  Labs:  Recent Labs  12/11/15 0518 12/11/15 2118 12/12/15 0645 12/13/15 0530  HGB 9.0*  --  9.2* 8.3*  HCT 26.8*  --  28.1* 25.4*  PLT 309  --  343 PLATELET CLUMPS NOTED ON SMEAR, UNABLE TO ESTIMATE  HEPARINUNFRC 0.46 0.52 0.60 0.53  CREATININE 1.17  --  1.28* 1.19    Estimated Creatinine Clearance: 64.3 mL/min (by C-G formula based on Cr of 1.19).  Assessment: 41 YOM known to pharmacy for TPN management and antibiotic dosing.  He is s/p ex lap on 1/6 and found to have perforated viscous and peritonitis. RUE doppler 1/11 reveals DVT of RUE along PICC site. PICC removed 1/11 and Left IJ placed.   Significant events: 1/11 baseline INR = 1.89, aPTT = 45sec 1/13 heparin held in AM for CT guided drain abscess drainage x 4 areas of fluid collection.  Heparin infusion resumed at previous rate (1800 units/hr) this afternoon at 1400 per IR instruction. Heparin level therapeutic after resumption of Heparin (0.40)  Today, 12/13/2015   Heparin level continues to be therapeutic on current rate of 2500 units/hr  CBC: stable.  No bleeding reported/documented.  AKI - SCr down - CrCl 64  Goal of Therapy:  Heparin level 0.3-0.7 units/ml Monitor platelets by  anticoagulation protocol: Yes   Plan:   Continue heparin infusion at current rate of 2500 units/hr  Daily CBC and heparin level  Awaiting plan for long term anticoagulation   Dolly Rias RPh 12/13/2015, 7:59 AM Pager [phone]

## 2015-12-13 NOTE — Progress Notes (Signed)
Patient ID: [name] [name], male   DOB: 01-07-1951, 65 y.o.   MRN: [id number]    Referring Physician(s): CCS  Chief Complaint: Abdominal fluid collections   Subjective:  Pt doing ok; rt arm still swollen; denies worsening abd pain,N/V  Allergies: Zetia; Atorvastatin; Penicillins; Pravastatin sodium; Lisinopril; and Promethazine  Medications: Prior to Admission medications   Medication Sig Start Date End Date Taking? Authorizing Provider  albuterol (PROVENTIL HFA;VENTOLIN HFA) 108 (90 BASE) MCG/ACT inhaler Inhale 2 puffs into the lungs every 6 (six) hours as needed for wheezing or shortness of breath. 11/11/13  Yes Ripudeep Krystal Eaton, MD  aspirin EC 81 MG tablet Take 1 tablet (81 mg total) by mouth daily. 11/06/15  Yes Sherren Mocha, MD  collagenase (SANTYL) ointment Apply thin layer to wound bed of left foot daily Patient taking differently: Apply 1 application topically daily. Apply thin layer to wound bed of left foot daily 10/26/15  Yes Rosetta Posner, MD  gabapentin (NEURONTIN) 300 MG capsule Take 300 mg by mouth 3 (three) times daily. 09/30/15  Yes Historical Provider, MD  glipiZIDE (GLUCOTROL) 10 MG tablet Take 10 mg by mouth 2 (two) times daily before a meal.   Yes Historical Provider, MD  ketoconazole (NIZORAL) 2 % cream Apply 1 application topically daily as needed for irritation.   Yes Historical Provider, MD  Multiple Vitamins-Minerals (MULTIVITAMIN WITH MINERALS) tablet Take 4 tablets by mouth 3 (three) times daily.   Yes Historical Provider, MD  nitroGLYCERIN (NITROSTAT) 0.4 MG SL tablet Place 0.4 mg under the tongue every 5 (five) minutes as needed for chest pain.   Yes Historical Provider, MD  oxyCODONE (ROXICODONE) 15 MG immediate release tablet Take 3 tablets (45 mg total) by mouth every 6 (six) hours as needed (severe pain). 07/30/15  Yes Kimberly A Trinh, PA-C  PATADAY 0.2 % SOLN Place 1 drop into both eyes daily.  08/11/14  Yes Historical Provider, MD  polyethylene glycol  (MIRALAX / GLYCOLAX) packet Take 17 g by mouth daily as needed for mild constipation or moderate constipation.    Yes Historical Provider, MD  triamcinolone cream (KENALOG) 0.1 % Apply 1 application topically 2 (two) times daily as needed (for eczema).   Yes Historical Provider, MD  zolpidem (AMBIEN) 10 MG tablet Take 10 mg by mouth at bedtime as needed for sleep.   Yes Historical Provider, MD  silver sulfADIAZINE (SILVADENE) 1 % cream Apply 1 application topically daily. Patient not taking: Reported on 11/14/2015 07/25/15   Rosetta Posner, MD     Vital Signs: BP 146/86 mmHg  Pulse 73  Temp(Src) 98 F (36.7 C) (Oral)  Resp 20  Ht 6\' 2"  (1.88 m)  Wt 157 lb 10.1 oz (71.5 kg)  BMI 20.23 kg/m2  SpO2 95%  Physical Exam right perihepatic/right lower quadrant/left upper quadrant drains are intact with serous-appearing output, sl more turbid in drain 2 . cx's neg; outputs range from 5 cc in RUQ drain to 65 cc in LUQ drain  Imaging: No results found.  Labs:  CBC:  Recent Labs  12/10/15 1310 12/11/15 0518 12/12/15 0645 12/13/15 0530  WBC 14.4* 13.5* 13.5* 11.7*  HGB 8.4* 9.0* 9.2* 8.3*  HCT 25.3* 26.8* 28.1* 25.4*  PLT 254 309 343 PLATELET CLUMPS NOTED ON SMEAR, UNABLE TO ESTIMATE    COAGS:  Recent Labs  07/26/15 1415 12/06/15 0925 12/08/15 0340  INR 1.09 1.89* 1.82*  APTT 37 45*  --     BMP:  Recent Labs  12/10/15  1310 12/11/15 0518 12/12/15 0645 12/13/15 0530  NA 138 138 135 135  K 3.6 3.7 3.8 4.4  CL 101 104 101 101  CO2 29 27 27 25   GLUCOSE 116* 141* 82 119*  BUN 40* 38* 36* 30*  CALCIUM 8.3* 8.2* 8.1* 8.1*  CREATININE 1.30* 1.17 1.28* 1.19  GFRNONAA 57* >60 58* >60  GFRAA >60 >60 >60 >60    LIVER FUNCTION TESTS:  Recent Labs  11/30/15 0415 12/04/15 0447 12/07/15 0445 12/11/15 0518  BILITOT 0.9 0.5 0.9 0.9  AST 21 30 27 21   ALT 18 15* 14* 11*  ALKPHOS 57 66 72 74  PROT 6.0* 4.6* 5.3* 5.8*  ALBUMIN 3.2* 1.9* 1.8* 1.9*    Assessment and  Plan: Patient status post small bowel perforation/repair. Status post drainage of right perihepatic/right lower quadrant and left upper quadrant fluid collections 1/13; AF; WBC 11.7(13.5), hgb 8.3(9.2), creat 1.19; for f/u CT on 1/19   Electronically Signed: D. Rowe Robert 12/13/2015, 2:31 PM   I spent a total of 15 minutes at the the patient's bedside AND on the patient's hospital floor or unit, greater than 50% of which was counseling/coordinating care for abdominal fluid collection drainages

## 2015-12-13 NOTE — Progress Notes (Signed)
Patient ID: [name] [name], male   DOB: Jun 24, 1951, 65 y.o.   MRN: [id number] [name] PROGRESS NOTE  [name] [name] [id number] DOB: 1951/06/23 DOA: 11/21/2015 PCP: Marijean Bravo, MD  Brief narrative:    65 year old very pleasant male with a past medical history of hyperlipidemia, hypertension, CAD, type 2 diabetes, diabetic peripheral neuropathy, seizure disorder, polysubstance abuse and chronic pain issues (on high doses of oxycodone prior to admission) who was admitted 11/22/15 with a small bowel obstruction. Surgical consultation was obtained on admission with conservative therapy initially recommended. Patient's bowel obstruction was felt to be from adhesions and possible narcotic bowel. His NG tube was removed 11/23/15 and he seemed to be improving clinically but pain returned 11/24/15 with diet advancement to clears. Repeat films showed worsening small bowel dilatation so his NG tube was reinserted. TPN was subsequently ordered. On 11/27/15, the patient refused PICC line insertion for TPN and surgical intervention. PICC finally placed 11/28/15 with initiation of TPN.   Further, patient has developed a right upper extremity DVT and currently is on IV heparin.  On 11/30/15, the patient's condition deteriorated acutely and he was found to be confused and restless. Rapid response was called. He was febrile and tachycardic with an elevated lactate level. Due to concerns for sepsis, broad-spectrum antibiotics were initiated with vancomycin and Primaxin which was switched to cefepime given his seizure history. Patient uderwent exploratory laparotomy on 12/01/15 with findings of perforated viscus with peritoneal contamination/peritonitis. He remained intubated postoperatively and was under the care of the critical care team. He self extubated 12/02/15. Surgery continues to follow, patient has an open abdominal wound. He continues to have an ileus and is on TNA at this time. [name]  assumed care 12/05/15.  Transferred to telemetry floor 12/08/2015.  Barrier to discharge: Slow improvement, ongoing pain, on TNA. Unable to predict when he will be ready for discharge but possible early next week.  Assessment/Plan:    Principal problem: SBO w/ perforated viscous and resultant peritonitis / leukocytosis / sepsis - General surgeon on board and managing - Plan is to repeat CT scan with contrast tomorrow to see if fluid collections have drained  Active problems:  Severe Protein calorie malnutrition / underweight - In the context of acute on chronic illness - Diet advancement per Gen surgery - Continue nutritional support with TPN  Acute upper extremity DVT - US DVT right distal axillary vein, subclavian vein.  - Continue heparin drip - No reports of bleeding - continue to monitor cbc  Post operative respiratory failure with hypoxemia - Patient required ventilatory support after exploratory laparotomy 12/01/2015.  - Patient self extubated 12/02/2015 - Respiratory status has improved with Lasix 80 mg IV given 12/05/2015 - Respiratory status continues to be stable   Acute Encephalopathy in setting of sepsis / history of seizure disorder - EEG done 11/30/2015 showed no acute seizures - Load with Keppra if seizures occur but so far no seizures at all  Acute kidney injury - In the setting of sepsis and small bowel obstruction with perforated viscus - Creatinine normalized with hydration  Anemia of chronic disease / thrombocytopenia - Related to history of substance and alcohol use  - Hemoglobin stable at 9.2 and platelet count is now within normal limits  Controlled, diabetes mellitus type 2 with peripheral circulatory complications without long-term insulin use - CBGs in past 24 hours: 95, 92, 80 - Continue sliding scale insulin - CBG in 10/2015 with A1c less than 7  Left lower  extremity wound / S/p left fem pop bypass w/ non-healing ulcer - WOC assessment  done   DVT Prophylaxis  - SCD's bilaterally in hospital   Code Status: Full.  Family Communication:  plan of care discussed with the patient, family not at the bedside this am Disposition Plan: unable to predict when pt can be discharged, slowly improving   IV access:  Central line   Procedures and diagnostic studies from 12/05/2015:  Ct Abdomen Pelvis Wo Contrast 12/07/2015   1. There is small bilateral pleural effusion right greater than left. Significant atelectasis or infiltrate in right lower lobe posteriorly. There is superimposed mild interstitial prominence bilateral lower lobes probable mild interstitial edema. 2. There is moderate perihepatic and perisplenic ascites. Moderate ascites noted bilateral paracolic gutters. Please note there is some loculation of perihepatic ascites with mild mass effect on the liver contour please see axial images 32 and 15. Peritoneal inflammation or infection cannot be excluded. 3. No hydronephrosis or hydroureter.  Stable bilateral renal cysts. 4. Postsurgical changes are noted anterior abdominal wall. Incomplete healed/open subcutaneous midline anterior abdominal wound. 5. Mild distended small bowel loops containing oral contrast material and some air-fluid levels. There is no transition point in caliber of small bowel. Findings most likely due to significant ileus or residual partial small bowel obstruction. There is some oral contrast material noted within distal colon. 6. Moderate pelvic ascites. Moderate gas noted within mid sigmoid colon probable mild ileus. 7. Significant anasarca infiltration of subcutaneous fat abdominal and pelvic wall. 8. 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 Electronically Signed   By: Lahoma Crocker M.D.   On: 12/07/2015 15:18   Dg Chest Port 1 View 12/06/2015  Stable bilateral pulmonary edema, with right basilar subsegmental atelectasis and associated pleural effusion. Interval placement of left internal jugular  catheter with distal tip in SVC. No pneumothorax is noted. Electronically Signed   By: Marijo Conception, M.D.   On: 12/06/2015 16:10   Dg Chest Port 1 View  12/05/2015   Worsening of pulmonary interstitial infiltrates bilaterally. This may reflect pulmonary edema of cardiac or noncardiac cause or could reflect pneumonia. There are small bilateral pleural effusions. Electronically Signed   By: David  Martinique M.D.   On: 12/05/2015 08:52   Exploratory laparotomy on 12/01/15   Medical Consultants:  Surgery  CCM transfer care to [facility] 1-10 PCT 12/08/2015  Other Consultants:  Nutrition PT  IAnti-Infectives:   Flagyl 11-30-2015 --> Cefepime 12-02-2015 --> 12/10/2015    Velvet Bathe, MD  [name] Pager [phone]  Time spent in minutes: 25 minutes  If 7PM-7AM, please contact night-coverage [email or web address] Password [email or web address] 12/13/2015, 3:16 PM   LOS: 22 days    HPI/Subjective: No new complaints reported today  Objective: Filed Vitals:   12/13/15 0418 12/13/15 0653 12/13/15 1000 12/13/15 1325  BP: 140/79  150/80 146/86  Pulse: 77  76 73  Temp: 100.8 F (38.2 C) 98.8 F (37.1 C) 98.5 F (36.9 C) 98 F (36.7 C)  TempSrc: Oral Oral Oral Oral  Resp: 18  22 20   Height:      Weight:      SpO2: 96%  95% 95%    Intake/Output Summary (Last 24 hours) at 12/13/15 1516 Last data filed at 12/13/15 1012  Gross per 24 hour  Intake     75 ml  Output 1182.5 ml  Net -1107.5 ml    Exam:   General:  Pt is alert, awake and in nad  Cardiovascular: Rate  controlled, (+) S1, S2 wnl  Respiratory:  No wheezing, no rhonchi  Abdomen: Has 3 JP drains, 2 on right side and one on left, tender to palpation, no rebound tenderness, appreciate bowel sounds  Extremities: No edema right lower extremity, has left unaboot (+)  Neuro:  No focal deficits   Data Reviewed: Basic Metabolic Panel:  Recent Labs Lab 12/07/15 0445 12/08/15 0340 12/10/15 1310 12/11/15 0518 12/12/15 0645  12/13/15 0530  NA 141 137 138 138 135 135  K 4.0 3.9 3.6 3.7 3.8 4.4  CL 110 104 101 104 101 101  CO2 24 26 29 27 27 25   GLUCOSE 140* 151* 116* 141* 82 119*  BUN 51* 52* 40* 38* 36* 30*  CREATININE 1.89* 1.68* 1.30* 1.17 1.28* 1.19  CALCIUM 8.4* 8.3* 8.3* 8.2* 8.1* 8.1*  MG 1.6*  --   --  1.5* 1.8 1.5*  PHOS 4.1  --   --  3.2 3.6 3.3   Liver Function Tests:  Recent Labs Lab 12/07/15 0445 12/11/15 0518  AST 27 21  ALT 14* 11*  ALKPHOS 72 74  BILITOT 0.9 0.9  PROT 5.3* 5.8*  ALBUMIN 1.8* 1.9*   No results for input(s): LIPASE, AMYLASE in the last 168 hours. No results for input(s): AMMONIA in the last 168 hours. CBC:  Recent Labs Lab 12/08/15 0340 12/10/15 1310 12/11/15 0518 12/12/15 0645 12/13/15 0530  WBC 17.6* 14.4* 13.5* 13.5* 11.7*  NEUTROABS  --   --  10.8*  --   --   HGB 10.8* 8.4* 9.0* 9.2* 8.3*  HCT 31.7* 25.3* 26.8* 28.1* 25.4*  MCV 85.4 86.3 85.6 87.0 85.8  PLT 129* 254 309 343 PLATELET CLUMPS NOTED ON SMEAR, UNABLE TO ESTIMATE   Cardiac Enzymes: No results for input(s): CKTOTAL, CKMB, CKMBINDEX, TROPONINI in the last 168 hours. BNP: Invalid input(s): POCBNP CBG:  Recent Labs Lab 12/12/15 2021 12/12/15 2320 12/13/15 0420 12/13/15 0735 12/13/15 1216  GLUCAP 106* 138* 111* 127* 126*    Recent Results (from the past 240 hour(s))  MRSA PCR Screening     Status: None   Collection Time: 12/04/15  9:57 AM  Result Value Ref Range Status   MRSA by PCR NEGATIVE NEGATIVE Final    Comment:        The GeneXpert MRSA Assay (FDA approved for NASAL specimens only), is one component of a comprehensive MRSA colonization surveillance program. It is not intended to diagnose MRSA infection nor to guide or monitor treatment for MRSA infections.   Culture, routine-abscess     Status: None   Collection Time: 12/08/15 12:41 PM  Result Value Ref Range Status   Specimen Description ABSCESS RLQ  Final   Special Requests NONE  Final   Gram Stain   Final     FEW WBC PRESENT,BOTH PMN AND MONONUCLEAR NO SQUAMOUS EPITHELIAL CELLS SEEN NO ORGANISMS SEEN Performed at [facility]    Culture   Final    NO GROWTH 2 DAYS Performed at [facility]    Report Status 12/10/2015 FINAL  Final  Anaerobic culture     Status: None   Collection Time: 12/08/15 12:41 PM  Result Value Ref Range Status   Specimen Description ABSCESS RLQ  Final   Special Requests NONE  Final   Gram Stain   Final    FEW WBC PRESENT,BOTH PMN AND MONONUCLEAR NO SQUAMOUS EPITHELIAL CELLS SEEN NO ORGANISMS SEEN Performed at [facility]   Final  NO ANAEROBES ISOLATED Performed at [facility]    Report Status 12/13/2015 FINAL  Final  Culture, routine-abscess     Status: None   Collection Time: 12/08/15 12:41 PM  Result Value Ref Range Status   Specimen Description ABSCESS PERIHEPATIC RUQ   Final   Special Requests NONE  Final   Gram Stain   Final    FEW WBC PRESENT, PREDOMINANTLY MONONUCLEAR NO SQUAMOUS EPITHELIAL CELLS SEEN NO ORGANISMS SEEN Performed at [facility]    Culture   Final    NO GROWTH 2 DAYS Performed at [facility]    Report Status 12/10/2015 FINAL  Final  Anaerobic culture     Status: None   Collection Time: 12/08/15 12:41 PM  Result Value Ref Range Status   Specimen Description ABSCESS PERIHEPATIC RUQ  Final   Special Requests NONE  Final   Gram Stain   Final    FEW WBC PRESENT, PREDOMINANTLY MONONUCLEAR NO SQUAMOUS EPITHELIAL CELLS SEEN NO ORGANISMS SEEN Performed at [facility]    Culture   Final    NO ANAEROBES ISOLATED Performed at [facility]    Report Status 12/13/2015 FINAL  Final  Culture, routine-abscess     Status: None   Collection Time: 12/08/15 12:41 PM  Result Value Ref Range Status   Specimen Description ABSCESS LUQ  Final   Special Requests NONE  Final   Gram Stain   Final    ABUNDANT WBC PRESENT,BOTH PMN AND MONONUCLEAR NO SQUAMOUS  EPITHELIAL CELLS SEEN NO ORGANISMS SEEN Performed at [facility]    Culture   Final    NO GROWTH 2 DAYS Performed at [facility]    Report Status 12/10/2015 FINAL  Final  Anaerobic culture     Status: None   Collection Time: 12/08/15 12:41 PM  Result Value Ref Range Status   Specimen Description ABSCESS LUQ  Final   Special Requests NONE  Final   Gram Stain   Final    ABUNDANT WBC PRESENT,BOTH PMN AND MONONUCLEAR NO SQUAMOUS EPITHELIAL CELLS SEEN NO ORGANISMS SEEN Performed at [facility]    Culture   Final    NO ANAEROBES ISOLATED Performed at [facility]    Report Status 12/13/2015 FINAL  Final     Scheduled Meds: . antiseptic oral rinse  7 mL Mouth Rinse q12n4p  . bacitracin   Topical Daily  . chlorhexidine  15 mL Mouth Rinse BID  . collagenase  1 application Topical Daily  . insulin aspart  0-15 Units Subcutaneous 6 times per day  . lip balm  1 application Topical BID  . metronidazole  500 mg Intravenous Q6H  . sodium chloride  3 mL Intravenous Q12H  . thiamine  100 mg Oral Daily   Continuous Infusions: . Marland KitchenTPN (CLINIMIX-E) Adult 65 mL/hr at 12/12/15 1806   And  . fat emulsion 240 mL (12/12/15 1806)  . Marland KitchenTPN (CLINIMIX-E) Adult     And  . fat emulsion    . heparin 2,500 Units/hr (12/13/15 [id number])  . lactated ringers 80 mL/hr at 12/13/15 0129

## 2015-12-13 NOTE — Clinical Social Work Note (Signed)
CSW met with pt at bedside to inform him that [facility], SNF extended a bed offer; pt was agreeable to placement. Upon exiting the room unit RN informed CSW that a new CIR consult was placed- pending acceptance. CSW spoke with [facility] in admissions at [facility], [facility] as a backup to CIR; [facility] decided to rescind offer citing the cannot accept any more Medicaid beds at this time.    Cindra Presume, LCSW [phone] Hospital psychiatric & 5E, 5W 34-37 Licensed Clinical Social Worker

## 2015-12-13 NOTE — Progress Notes (Signed)
Patient ID: [name] [name], male   DOB: January 15, 1951, 65 y.o.   MRN: [id number]     [address] [place], [place] [address]    Phone: [phone] FAX: [phone]     Subjective: Febrile 100.8. WBC trending down.   Drain output decreasing although #2 with purulent and 3m recorded.   Objective:  Vital signs:  Filed Vitals:   12/12/15 1500 12/12/15 2025 12/13/15 0418 12/13/15 0653  BP: 143/85 138/78 140/79   Pulse: 76 83 77   Temp: 97.8 F (36.6 C) 99.2 F (37.3 C) 100.8 F (38.2 C) 98.8 F (37.1 C)  TempSrc: Oral Oral Oral Oral  Resp: 18 18 18    Height:      Weight:      SpO2: 92% 91% 96%     Last BM Date: 12/11/15  Intake/Output   Yesterday:  01/17 0701 - 01/18 0700 In: 90  Out: 1364.5 [Urine:1250; Drains:114.5] This shift:     Physical Exam: General: Pt awake/alert to self.   Abdomen: +BS, abdomen is soft, nondistended. Midline wound clean, retention sutures in place, packing replaced.  Drain #2 purulent output, 1 and 3 are serosanguinous.   Problem List:   Principal Problem:   Small bowel obstruction & perforation s/p OR exploration & repair 12/01/2015 Active Problems:   Protein-calorie malnutrition, severe (HCC)   Purulent peritonitis ([place]   Underweight   Acute respiratory failure with hypoxemia (HCC)   DVT of axillary vein, acute right   Intra-abdominal fluid collection   Palliative care encounter   Acute encephalopathy   Seizures (HCC)   [place] kidney injury ([place]   Controlled diabetes mellitus with circulatory complication, without long-term current use of insulin (HCC)   Anemia of chronic disease   Sepsis ([place]   Leukocytosis   [place]rombocytopenia [place]    Results:   Labs: Results for orders placed or performed during the hospital encounter of 11/21/15 (from the past 48 hour(s))  Glucose, capillary     Status: None   Collection Time: 12/11/15 12:14 PM  Result Value Ref  Range   Glucose-Capillary 70 65 - 99 mg/dL  Glucose, capillary     Status: None   Collection Time: 12/11/15  4:49 PM  Result Value Ref Range   Glucose-Capillary 81 65 - 99 mg/dL  Glucose, capillary     Status: None   Collection Time: 12/11/15  9:07 PM  Result Value Ref Range   Glucose-Capillary 86 65 - 99 mg/dL  Heparin level (unfractionated)     Status: None   Collection Time: 12/11/15  9:18 PM  Result Value Ref Range   Heparin Unfractionated 0.52 0.30 - 0.70 IU/mL    Comment:        IF HEPARIN RESULTS ARE BELOW EXPECTED VALUES, AND PATIENT DOSAGE HAS BEEN CONFIRMED, SUGGEST FOLLOW UP TESTING OF ANTITHROMBIN III LEVELS.   Glucose, capillary     Status: None   Collection Time: 12/11/15 11:47 PM  Result Value Ref Range   Glucose-Capillary 95 65 - 99 mg/dL  Glucose, capillary     Status: None   Collection Time: 12/12/15  4:00 AM  Result Value Ref Range   Glucose-Capillary 92 65 - 99 mg/dL  CBC     Status: Abnormal   Collection Time: 12/12/15  6:45 AM  Result Value Ref Range   WBC 13.5 (H) 4.0 - 10.5 K/uL   RBC 3.23 (L) 4.22 - 5.81 MIL/uL  Hemoglobin 9.2 (L) 13.0 - 17.0 g/dL   HCT 28.1 (L) 39.0 - 52.0 %   MCV 87.0 78.0 - 100.0 fL   MCH 28.5 26.0 - 34.0 pg   MCHC 32.7 30.0 - 36.0 g/dL   RDW 14.8 11.5 - 15.5 %   Platelets 343 150 - 400 K/uL  Heparin level (unfractionated)     Status: None   Collection Time: 12/12/15  6:45 AM  Result Value Ref Range   Heparin Unfractionated 0.60 0.30 - 0.70 IU/mL    Comment:        IF HEPARIN RESULTS ARE BELOW EXPECTED VALUES, AND PATIENT DOSAGE HAS BEEN CONFIRMED, SUGGEST FOLLOW UP TESTING OF ANTITHROMBIN III LEVELS.   Basic metabolic panel     Status: Abnormal   Collection Time: 12/12/15  6:45 AM  Result Value Ref Range   Sodium 135 135 - 145 mmol/L   Potassium 3.8 3.5 - 5.1 mmol/L   Chloride 101 101 - 111 mmol/L   CO2 27 22 - 32 mmol/L   Glucose, Bld 82 65 - 99 mg/dL   BUN 36 (H) 6 - 20 mg/dL   Creatinine, Ser 1.28 (H) 0.61  - 1.24 mg/dL   Calcium 8.1 (L) 8.9 - 10.3 mg/dL   GFR calc non Af Amer 58 (L) >60 mL/min   GFR calc Af Amer >60 >60 mL/min    Comment: (NOTE) The eGFR has been calculated using the CKD EPI equation. This calculation has not been validated in all clinical situations. eGFR's persistently <60 mL/min signify possible Chronic Kidney Disease.    Anion gap 7 5 - 15  Magnesium     Status: None   Collection Time: 12/12/15  6:45 AM  Result Value Ref Range   Magnesium 1.8 1.7 - 2.4 mg/dL  Phosphorus     Status: None   Collection Time: 12/12/15  6:45 AM  Result Value Ref Range   Phosphorus 3.6 2.5 - 4.6 mg/dL  Glucose, capillary     Status: None   Collection Time: 12/12/15  7:55 AM  Result Value Ref Range   Glucose-Capillary 80 65 - 99 mg/dL   Comment 1 Notify RN   Glucose, capillary     Status: None   Collection Time: 12/12/15 11:44 AM  Result Value Ref Range   Glucose-Capillary 91 65 - 99 mg/dL   Comment 1 Notify RN   Glucose, capillary     Status: Abnormal   Collection Time: 12/12/15  4:16 PM  Result Value Ref Range   Glucose-Capillary 104 (H) 65 - 99 mg/dL   Comment 1 Notify RN   Glucose, capillary     Status: Abnormal   Collection Time: 12/12/15  8:21 PM  Result Value Ref Range   Glucose-Capillary 106 (H) 65 - 99 mg/dL  Glucose, capillary     Status: Abnormal   Collection Time: 12/12/15 11:20 PM  Result Value Ref Range   Glucose-Capillary 138 (H) 65 - 99 mg/dL  Glucose, capillary     Status: Abnormal   Collection Time: 12/13/15  4:20 AM  Result Value Ref Range   Glucose-Capillary 111 (H) 65 - 99 mg/dL  CBC     Status: Abnormal   Collection Time: 12/13/15  5:30 AM  Result Value Ref Range   WBC 11.7 (H) 4.0 - 10.5 K/uL    Comment: WHITE COUNT CONFIRMED ON SMEAR   RBC 2.96 (L) 4.22 - 5.81 MIL/uL   Hemoglobin 8.3 (L) 13.0 - 17.0 g/dL   HCT 25.4 (L)  39.0 - 52.0 %   MCV 85.8 78.0 - 100.0 fL   MCH 28.0 26.0 - 34.0 pg   MCHC 32.7 30.0 - 36.0 g/dL   RDW 14.9 11.5 - 15.5 %    Platelets PLATELET CLUMPS NOTED ON SMEAR, UNABLE TO ESTIMATE 150 - 400 K/uL  Heparin level (unfractionated)     Status: None   Collection Time: 12/13/15  5:30 AM  Result Value Ref Range   Heparin Unfractionated 0.53 0.30 - 0.70 IU/mL    Comment:        IF HEPARIN RESULTS ARE BELOW EXPECTED VALUES, AND PATIENT DOSAGE HAS BEEN CONFIRMED, SUGGEST FOLLOW UP TESTING OF ANTITHROMBIN III LEVELS.   Basic metabolic panel     Status: Abnormal   Collection Time: 12/13/15  5:30 AM  Result Value Ref Range   Sodium 135 135 - 145 mmol/L   Potassium 4.4 3.5 - 5.1 mmol/L   Chloride 101 101 - 111 mmol/L   CO2 25 22 - 32 mmol/L   Glucose, Bld 119 (H) 65 - 99 mg/dL   BUN 30 (H) 6 - 20 mg/dL   Creatinine, Ser 1.19 0.61 - 1.24 mg/dL   Calcium 8.1 (L) 8.9 - 10.3 mg/dL   GFR calc non Af Amer >60 >60 mL/min   GFR calc Af Amer >60 >60 mL/min    Comment: (NOTE) The eGFR has been calculated using the CKD EPI equation. This calculation has not been validated in all clinical situations. eGFR's persistently <60 mL/min signify possible Chronic Kidney Disease.    Anion gap 9 5 - 15  Phosphorus     Status: None   Collection Time: 12/13/15  5:30 AM  Result Value Ref Range   Phosphorus 3.3 2.5 - 4.6 mg/dL  Magnesium     Status: Abnormal   Collection Time: 12/13/15  5:30 AM  Result Value Ref Range   Magnesium 1.5 (L) 1.7 - 2.4 mg/dL  Glucose, capillary     Status: Abnormal   Collection Time: 12/13/15  7:35 AM  Result Value Ref Range   Glucose-Capillary 127 (H) 65 - 99 mg/dL   Comment 1 Notify RN     Imaging / Studies: No results found.  Medications / Allergies:  Scheduled Meds: . antiseptic oral rinse  7 mL Mouth Rinse q12n4p  . bacitracin   Topical Daily  . chlorhexidine  15 mL Mouth Rinse BID  . collagenase  1 application Topical Daily  . insulin aspart  0-15 Units Subcutaneous 6 times per day  . lip balm  1 application Topical BID  . metronidazole  500 mg Intravenous Q6H  . sodium chloride   3 mL Intravenous Q12H  . thiamine  100 mg Oral Daily   Continuous Infusions: . Marland KitchenTPN (CLINIMIX-E) Adult 65 mL/hr at 12/12/15 1806   And  . fat emulsion 240 mL (12/12/15 1806)  . Marland KitchenTPN (CLINIMIX-E) Adult     And  . fat emulsion    . heparin 2,500 Units/hr (12/13/15 2536)  . lactated ringers 80 mL/hr at 12/13/15 0129   PRN Meds:.acetaminophen, albuterol, alum & mag hydroxide-simeth, bisacodyl, diphenhydrAMINE, hydrALAZINE, HYDROmorphone (DILAUDID) injection, ketorolac, magic mouthwash, menthol-cetylpyridinium, ondansetron (ZOFRAN) IV, oxyCODONE, phenol, sodium chloride, sodium chloride, sodium chloride, triamcinolone cream  Antibiotics: Anti-infectives    Start     Dose/Rate Route Frequency Ordered Stop   12/08/15 0800  metroNIDAZOLE (FLAGYL) IVPB 500 mg     500 mg 100 mL/hr over 60 Minutes Intravenous Every 6 hours 12/08/15 0737  12/03/15 1800  vancomycin (VANCOCIN) IVPB 750 mg/150 ml premix  Status:  Discontinued     750 mg 150 mL/hr over 60 Minutes Intravenous Every 24 hours 12/03/15 1715 12/04/15 0911   12/02/15 1800  ceFEPIme (MAXIPIME) 1 g in dextrose 5 % 50 mL IVPB     1 g 100 mL/hr over 30 Minutes Intravenous Every 12 hours 12/02/15 1014 12/09/15 1759   12/02/15 0600  vancomycin (VANCOCIN) IVPB 1000 mg/200 mL premix  Status:  Discontinued     1,000 mg 200 mL/hr over 60 Minutes Intravenous 3 times per day 12/02/15 0547 12/02/15 0954   12/01/15 2200  ceFEPIme (MAXIPIME) 1 g in dextrose 5 % 50 mL IVPB  Status:  Discontinued     1 g 100 mL/hr over 30 Minutes Intravenous 3 times per day 12/01/15 1455 12/02/15 1014   11/30/15 1400  ceFEPIme (MAXIPIME) 1 g in dextrose 5 % 50 mL IVPB  Status:  Discontinued     1 g 100 mL/hr over 30 Minutes Intravenous 3 times per day 11/30/15 0913 12/01/15 1455   11/30/15 1000  metroNIDAZOLE (FLAGYL) IVPB 500 mg     500 mg 100 mL/hr over 60 Minutes Intravenous Every 8 hours 11/30/15 0858 12/07/15 0226   11/30/15 0645  vancomycin (VANCOCIN) IVPB  750 mg/150 ml premix  Status:  Discontinued     750 mg 150 mL/hr over 60 Minutes Intravenous 3 times per day 11/30/15 0630 12/02/15 0547   11/30/15 0645  imipenem-cilastatin (PRIMAXIN) 500 mg in sodium chloride 0.9 % 100 mL IVPB  Status:  Discontinued     500 mg 200 mL/hr over 30 Minutes Intravenous 3 times per day 11/30/15 0630 11/30/15 0859       Assessment/Plan POD#12 exlap, and repair of SB perforation in setting of SBO--Dr. Hassell Done 12/01/15 -pulmonary toilet, mobilize  -BID wet to dry dressing changes to open wound/retention sutures Post op intra-abdominal abscesses-s/p IR drains x3.  CT in AM Post op ileus-resolved ID-on flagyl. only  IR abscess culture NTD, although drain #2 looks purulent.  RUE DVT-heparin gtt PCM-TPN until PO is adequate. Prealbumin is 2.7(1/16) FEN-advance diet as tolerated.  Supplement Mg per pharmacy  Dispo-continue inpatient.  CT in AM  Erby Pian, [facility] Surgery Pager [phone]([phone])   12/13/2015 9:54 AM

## 2015-12-13 NOTE — Progress Notes (Signed)
PARENTERAL NUTRITION CONSULT NOTE - FOLLOW-UP  Pharmacy Consult for TPN Indication: Bowel obstruction, post op ileus  Allergies  Allergen Reactions  . Zetia [Ezetimibe] Anaphylaxis and Swelling    Tongue and throat   . Atorvastatin Other (See Comments)    Malaise & muscle weakness  . Penicillins Other (See Comments)    Unknown.  Tolerates cefepime  . Pravastatin Sodium     Pravastatin 40 mg qday and 40 mg q M/W/F caused muscle aches  . Lisinopril Rash  . Promethazine Rash   Patient Measurements: Height: 6\' 2"  (188 cm) Weight: 157 lb 10.1 oz (71.5 kg) IBW/kg (Calculated) : 82.2 Usual Weight: 137-140 lbs  Vital Signs: Temp: 98.8 F (37.1 C) (01/18 0653) Temp Source: Oral (01/18 0653) BP: 140/79 mmHg (01/18 0418) Pulse Rate: 77 (01/18 0418) Intake/Output from previous day: 01/17 0701 - 01/18 0700 In: 90  Out: 1364.5 [Urine:1250; Drains:114.5] Intake/Output from this shift:   Labs:  Recent Labs  12/11/15 0518 12/12/15 0645 12/13/15 0530  WBC 13.5* 13.5* 11.7*  HGB 9.0* 9.2* 8.3*  HCT 26.8* 28.1* 25.4*  PLT 309 343 PLATELET CLUMPS NOTED ON SMEAR, UNABLE TO ESTIMATE    Recent Labs  12/11/15 0518 12/12/15 0645 12/13/15 0530  NA 138 135 135  K 3.7 3.8 4.4  CL 104 101 101  CO2 27 27 25   GLUCOSE 141* 82 119*  BUN 38* 36* 30*  CREATININE 1.17 1.28* 1.19  CALCIUM 8.2* 8.1* 8.1*  MG 1.5* 1.8 1.5*  PHOS 3.2 3.6 3.3  PROT 5.8*  --   --   ALBUMIN 1.9*  --   --   AST 21  --   --   ALT 11*  --   --   ALKPHOS 74  --   --   BILITOT 0.9  --   --   PREALBUMIN 2.7*  --   --   TRIG 95  --   --    Estimated Creatinine Clearance: 64.3 mL/min (by C-G formula based on Cr of 1.19).    Recent Labs  12/12/15 2320 12/13/15 0420 12/13/15 0735  GLUCAP 138* 111* 127*   Medical History: History reviewed. No pertinent past medical history. Medications:  Infusions:  . Marland KitchenTPN (CLINIMIX-E) Adult 65 mL/hr at 12/12/15 1806   And  . fat emulsion 240 mL (12/12/15 1806)  .  heparin 2,500 Units/hr (12/12/15 2230)  . lactated ringers 80 mL/hr at 12/13/15 0129   Insulin Requirements in the past 24 hours: 2 units SSI/24h + 10 units Regular insulin in TPN  Current Nutrition: NPO for abscess aspiration/drain placement, some CL  IVF: LR 80 ml/hr  Central access: PICC placed 1/3 TPN start date: 1/3  ASSESSMENT                                                                                                          HPI: 56 yoM presented to ED on 12/27 with abdominal pain.  PMH includes HTN, HLD, PVD - s/p fem bypass, CAD s/p NSTEMI 2014, cardiomyopathy, DM, diabetic peripheral neuropathy, seizures, and  chronic pain on chronic opioids.  Surgical history significant for open AAA repair.  CT shows partial SBO, likely adhesions.  Patient refused surgery, until 1/6 s/p repair of perforated visous.  Plan for conservative management with TPN, bowel rest.   Significant events:  1/1: TPN per pharmacy ordered - unable to start without central access.  1/3: PICC line placed; TPN started 1/5: Acute encephalopathy with fevers (102F) overnight- sepsis protocol initiated.  Broad-spectrum abx started.  1/6: Consented to laparotomy w/ findings of perforated viscus and peritonitis. Txfer to ICU   1/10: NGT removed 1/11: started clear liquid diet 1/13 NPO again for CT guided drain placement for perihepatic fluid collection suspicious for abscess. FL diet post-procedure 1/16: per CCS, no bowel sounds noted today, patient needs to be walked as much as possible  Today 12/13/2015:   Glucose: at goal with insulin in TPN. Hx DM on glipizide PTA.   Electrolytes - WNL, mag low  Renal - AKI; SCr trending down.  UOP increased 1.4L, 0.7 ml/kg/hr  I/O: -1272 mL   Drains x3: output 82 ml  LFTs - AST/ALT and Tbili ok   TGs -  (Hx of HPL) 124 (1/2), 164 (1/9), 95 (1/16)  Prealbumin - 3.7 (1/2), < 2 (1/9), 2.7 (1/16)  NUTRITIONAL GOALS                                                                                              RD recs: 1600-1800 Kcal/day, 65-80 g protein/day, > 2 L fluid/day  Clinimix E 5/15 at a goal rate of 65 ml/hr + 20% fat emulsion at 10 ml/hr to provide: 78 g/day protein, 1588 Kcal/day.  PLAN                                                                                                    Mag Sulfate 1gm IV  X 1                       Continue Clinimix E 5/15 at goal rate of 65 ml/h  Continue with 10 units of insulin added to bag  Continue 20% fat emulsion at 10 ml/hr  Standard MVI, MTE in TPN bag.  Continue q6h CBGs with moderate correction insulin.  TPN lab panels on Mondays & Thursdays.     Dolly Rias RPh 12/13/2015, 8:09 AM Pager [phone]

## 2015-12-14 ENCOUNTER — Inpatient Hospital Stay (HOSPITAL_COMMUNITY): Payer: Medicaid Other

## 2015-12-14 ENCOUNTER — Encounter (HOSPITAL_COMMUNITY): Payer: Self-pay | Admitting: Radiology

## 2015-12-14 LAB — COMPREHENSIVE METABOLIC PANEL
ALBUMIN: 1.8 g/dL — AB (ref 3.5–5.0)
ALK PHOS: 101 U/L (ref 38–126)
ALT: 9 U/L — ABNORMAL LOW (ref 17–63)
AST: 21 U/L (ref 15–41)
Anion gap: 6 (ref 5–15)
BILIRUBIN TOTAL: 0.5 mg/dL (ref 0.3–1.2)
BUN: 26 mg/dL — AB (ref 6–20)
CALCIUM: 8.3 mg/dL — AB (ref 8.9–10.3)
CO2: 28 mmol/L (ref 22–32)
CREATININE: 1.04 mg/dL (ref 0.61–1.24)
Chloride: 103 mmol/L (ref 101–111)
GFR calc Af Amer: >60 mL/min (ref >60)
GFR calc non Af Amer: >60 mL/min (ref >60)
GLUCOSE: 96 mg/dL (ref 65–99)
Potassium: 4.1 mmol/L (ref 3.5–5.1)
Sodium: 137 mmol/L (ref 135–145)
TOTAL PROTEIN: 6.2 g/dL — AB (ref 6.5–8.1)

## 2015-12-14 LAB — CBC
HCT: 24.8 % — ABNORMAL LOW (ref 39.0–52.0)
Hemoglobin: 8 g/dL — ABNORMAL LOW (ref 13.0–17.0)
MCH: 28.1 pg (ref 26.0–34.0)
MCHC: 32.3 g/dL (ref 30.0–36.0)
MCV: 87 fL (ref 78.0–100.0)
PLATELETS: 344 10*3/uL (ref 150–400)
RBC: 2.85 MIL/uL — ABNORMAL LOW (ref 4.22–5.81)
RDW: 15.3 % (ref 11.5–15.5)
WBC: 10.8 10*3/uL — ABNORMAL HIGH (ref 4.0–10.5)

## 2015-12-14 LAB — PHOSPHORUS: Phosphorus: 3.1 mg/dL (ref 2.5–4.6)

## 2015-12-14 LAB — GLUCOSE, CAPILLARY
GLUCOSE-CAPILLARY: 108 mg/dL — AB (ref 65–99)
GLUCOSE-CAPILLARY: 115 mg/dL — AB (ref 65–99)
GLUCOSE-CAPILLARY: 121 mg/dL — AB (ref 65–99)
GLUCOSE-CAPILLARY: 144 mg/dL — AB (ref 65–99)
GLUCOSE-CAPILLARY: 78 mg/dL (ref 65–99)
GLUCOSE-CAPILLARY: 87 mg/dL (ref 65–99)
Glucose-Capillary: 139 mg/dL — ABNORMAL HIGH (ref 65–99)

## 2015-12-14 LAB — HEPARIN LEVEL (UNFRACTIONATED): Heparin Unfractionated: 0.67 IU/mL (ref 0.30–0.70)

## 2015-12-14 LAB — MAGNESIUM: Magnesium: 1.5 mg/dL — ABNORMAL LOW (ref 1.7–2.4)

## 2015-12-14 MED ORDER — IOHEXOL 300 MG/ML  SOLN
25.0000 mL | Freq: Once | INTRAMUSCULAR | Status: AC | PRN
  Administered 2015-12-14: 25 mL

## 2015-12-14 MED ORDER — TRACE MINERALS CR-CU-MN-SE-ZN 10-1000-500-60 MCG/ML IV SOLN
INTRAVENOUS | Status: AC
  Administered 2015-12-14: 18:00:00 via INTRAVENOUS
  Filled 2015-12-14: qty 1560

## 2015-12-14 MED ORDER — FAT EMULSION 20 % IV EMUL
240.0000 mL | INTRAVENOUS | Status: AC
  Administered 2015-12-14: 240 mL via INTRAVENOUS
  Filled 2015-12-14: qty 250

## 2015-12-14 MED ORDER — ALBUTEROL SULFATE (2.5 MG/3ML) 0.083% IN NEBU
2.5000 mg | INHALATION_SOLUTION | Freq: Four times a day (QID) | RESPIRATORY_TRACT | Status: DC
  Administered 2015-12-14 (×2): 2.5 mg via RESPIRATORY_TRACT
  Filled 2015-12-14 (×2): qty 3

## 2015-12-14 MED ORDER — FENTANYL CITRATE (PF) 100 MCG/2ML IJ SOLN
INTRAMUSCULAR | Status: AC
  Administered 2015-12-14: 50 ug via INTRAVENOUS
  Filled 2015-12-14: qty 2

## 2015-12-14 MED ORDER — FENTANYL CITRATE (PF) 100 MCG/2ML IJ SOLN: 50.0000 ug | Freq: Once | INTRAMUSCULAR | Status: DC

## 2015-12-14 MED ORDER — MAGNESIUM SULFATE IN D5W 10-5 MG/ML-% IV SOLN
1.0000 g | Freq: Once | INTRAVENOUS | Status: AC
  Administered 2015-12-14: 1 g via INTRAVENOUS
  Filled 2015-12-14: qty 100

## 2015-12-14 MED ORDER — IOHEXOL 300 MG/ML  SOLN
100.0000 mL | Freq: Once | INTRAMUSCULAR | Status: AC | PRN
  Administered 2015-12-14: 100 mL via INTRAVENOUS

## 2015-12-14 MED ORDER — ALBUTEROL SULFATE (2.5 MG/3ML) 0.083% IN NEBU: 2.5000 mg | INHALATION_SOLUTION | Freq: Two times a day (BID) | RESPIRATORY_TRACT | Status: DC

## 2015-12-14 MED ORDER — LIDOCAINE HCL 1 % IJ SOLN
INTRAMUSCULAR | Status: AC
  Filled 2015-12-14: qty 20

## 2015-12-14 NOTE — Procedures (Signed)
Interventional Radiology Procedure Note  Procedure: 1.) RLQ drain removed 2.) Attempted b ut unsuccessful repositioning of RUQ drain into the subdiaphragmatic space. Drain removed.  3.) Successful reposition of LUQ drain into subdiaphragmatic space with return of copious cloudy fluid.  Left to JP drain.   Complications: None  Estimated Blood Loss: 0  Recommendations: - Replace RUQ drain tomorrow with CT guidance.  Need to drain suprahepatic portion.  Order placed. - Monitor out-put of newly repositioned LUQ drain.  Signed,  Criselda Peaches, MD

## 2015-12-14 NOTE — Progress Notes (Signed)
Patient ID: [name] [name], male   DOB: Aug 24, 1951, 65 y.o.   MRN: [id number] 13 Days Post-Op  Subjective: Feels ok, but has ups and downs.  Had a BM yesterday.  Tolerating his full liquids.  Objective: Vital signs in last 24 hours: Temp:  [98 F (36.7 C)-98.5 F (36.9 C)] 98.4 F (36.9 C) (01/19 0451) Pulse Rate:  [71-76] 71 (01/19 0451) Resp:  [19-22] 19 (01/19 0451) BP: (126-152)/(80-91) 126/83 mmHg (01/19 0451) SpO2:  [95 %-96 %] 96 % (01/19 0451) Last BM Date: 12/11/15  Intake/Output from previous day: 01/18 0701 - 01/19 0700 In: 625 [P.O.:560] Out: 420 [Urine:350; Drains:70] Intake/Output this shift: Total I/O In: -  Out: 800 [Urine:800]  PE: Abd: soft, midline wound clean and packed with retention bars in place, all 3 JP drains with minimal serous drainage, some with some cloudiness to them, +BS  Lab Results:   Recent Labs  12/13/15 0530 12/14/15 0515  WBC 11.7* 10.8*  HGB 8.3* 8.0*  HCT 25.4* 24.8*  PLT PLATELET CLUMPS NOTED ON SMEAR, UNABLE TO ESTIMATE 344   BMET  Recent Labs  12/13/15 0530 12/14/15 0515  NA 135 137  K 4.4 4.1  CL 101 103  CO2 25 28  GLUCOSE 119* 96  BUN 30* 26*  CREATININE 1.19 1.04  CALCIUM 8.1* 8.3*   PT/INR No results for input(s): LABPROT, INR in the last 72 hours. CMP     Component Value Date/Time   NA 137 12/14/2015 0515   K 4.1 12/14/2015 0515   CL 103 12/14/2015 0515   CO2 28 12/14/2015 0515   GLUCOSE 96 12/14/2015 0515   BUN 26* 12/14/2015 0515   CREATININE 1.04 12/14/2015 0515   CALCIUM 8.3* 12/14/2015 0515   PROT 6.2* 12/14/2015 0515   ALBUMIN 1.8* 12/14/2015 0515   AST 21 12/14/2015 0515   ALT 9* 12/14/2015 0515   ALKPHOS 101 12/14/2015 0515   BILITOT 0.5 12/14/2015 0515   GFRNONAA >60 12/14/2015 0515   GFRAA >60 12/14/2015 0515   Lipase     Component Value Date/Time   LIPASE 21 11/21/2015 1621       Studies/Results: No results found.  Anti-infectives: Anti-infectives    Start      Dose/Rate Route Frequency Ordered Stop   12/08/15 0800  metroNIDAZOLE (FLAGYL) IVPB 500 mg     500 mg 100 mL/hr over 60 Minutes Intravenous Every 6 hours 12/08/15 0737     12/03/15 1800  vancomycin (VANCOCIN) IVPB 750 mg/150 ml premix  Status:  Discontinued     750 mg 150 mL/hr over 60 Minutes Intravenous Every 24 hours 12/03/15 1715 12/04/15 0911   12/02/15 1800  ceFEPIme (MAXIPIME) 1 g in dextrose 5 % 50 mL IVPB     1 g 100 mL/hr over 30 Minutes Intravenous Every 12 hours 12/02/15 1014 12/09/15 1759   12/02/15 0600  vancomycin (VANCOCIN) IVPB 1000 mg/200 mL premix  Status:  Discontinued     1,000 mg 200 mL/hr over 60 Minutes Intravenous 3 times per day 12/02/15 0547 12/02/15 0954   12/01/15 2200  ceFEPIme (MAXIPIME) 1 g in dextrose 5 % 50 mL IVPB  Status:  Discontinued     1 g 100 mL/hr over 30 Minutes Intravenous 3 times per day 12/01/15 1455 12/02/15 1014   11/30/15 1400  ceFEPIme (MAXIPIME) 1 g in dextrose 5 % 50 mL IVPB  Status:  Discontinued     1 g 100 mL/hr over 30 Minutes Intravenous 3 times per day  11/30/15 0913 12/01/15 1455   11/30/15 1000  metroNIDAZOLE (FLAGYL) IVPB 500 mg     500 mg 100 mL/hr over 60 Minutes Intravenous Every 8 hours 11/30/15 0858 12/07/15 0226   11/30/15 0645  vancomycin (VANCOCIN) IVPB 750 mg/150 ml premix  Status:  Discontinued     750 mg 150 mL/hr over 60 Minutes Intravenous 3 times per day 11/30/15 0630 12/02/15 0547   11/30/15 0645  imipenem-cilastatin (PRIMAXIN) 500 mg in sodium chloride 0.9 % 100 mL IVPB  Status:  Discontinued     500 mg 200 mL/hr over 30 Minutes Intravenous 3 times per day 11/30/15 0630 11/30/15 0859       Assessment/Plan  POD#13 exlap, and repair of SB perforation in setting of SBO--Dr. Hassell Done 12/01/15 -pulmonary toilet, mobilize  -BID wet to dry dressing changes to open wound/retention sutures Post op intra-abdominal abscesses-s/p IR drains x3. CT today to eval for resolution Post op ileus-resolved ID-on flagyl. only  IR abscess culture NGTD, although drain #2 looks purulent.  RUE DVT-heparin gtt PCM-TPN until PO is adequate. Prealbumin is 2.7(1/16) FEN-keep on fulls but can likely advance to soft after CT today.  Dispo-continue inpatient. CT pending  LOS: 23 days    [name] [name] E 12/14/2015, 8:49 AM Pager: [phone]

## 2015-12-14 NOTE — Progress Notes (Signed)
Physical Therapy Treatment Patient Details Name: [name] [name] MRN: [id number] DOB: 1951/10/15 Today's Date: 12/14/2015    History of Present Illness 65 year old aam who was admitted on 12/27 w/ abd pain found to be 2/2 SBO on CT scan. Marland Kitchen His hospital course was complicated by fever spike and delirium on 1/5. At that time has abd was more distended.  He went for exploratory lap 12/01/15 which was notable for significant peritoneal contamination. He is s/p exploration, washout and repair of perforated viscous    PT Comments    Limited session d/t pt limited cooperation; continue to recommend SNF  Follow Up Recommendations  SNF;Supervision/Assistance - 24 hour     Equipment Recommendations  Wheelchair (measurements PT)    Recommendations for Other Services       Precautions / Restrictions Precautions Precautions: Fall Precaution Comments: profound LE weakness    Mobility  Bed Mobility               General bed mobility comments: pt declines after initially agreeing to EOB  Transfers                 General transfer comment: NT/pt declines, stating he was up in the cchair earlier with nursing  Ambulation/Gait                 Stairs            Wheelchair Mobility    Modified Rankin (Stroke Patients Only)       Balance                                    Cognition Arousal/Alertness: Awake/alert   Overall Cognitive Status: Impaired/Different from baseline Area of Impairment: Awareness;Safety/judgement       Following Commands: Follows one step commands inconsistently Safety/Judgement: Decreased awareness of safety;Decreased awareness of deficits     General Comments: pt tangential at times, appears to have difficulty focusing, requires redirection to task; he had a friend bring him a RW so he could walk with it    Exercises General Exercises - Lower Extremity Ankle Circles/Pumps: Both;10 reps;AAROM;Supine Quad Sets:  AROM;Strengthening;Both;10 reps Short Arc QuadSinclair [name];Both;5 reps Heel Slides: AROM;AAROM;Both;10 reps;Other (comment) (manual resistance for knee and hip extension)    General Comments        Pertinent Vitals/Pain Pain Assessment: 0-10 Pain Location: pt complains of "pain", does not specify where or  what type or rate it; no outward appearance of being in pain    Home Living                      Prior Function            PT Goals (current goals can now be found in the care plan section) Acute Rehab PT Goals Patient Stated Goal: to be able to pray PT Goal Formulation: With patient Time For Goal Achievement: 12/19/15 Potential to Achieve Goals: Fair Progress towards PT goals: Progressing toward goals    Frequency  Min 3X/week    PT Plan Current plan remains appropriate    Co-evaluation             End of Session   Activity Tolerance: Patient limited by pain Patient left: in bed;with call bell/phone within reach;with bed alarm set     Time: [id number] PT Time Calculation (min) (ACUTE ONLY): 12 min  Charges:  $Therapeutic Exercise:  8-22 mins                    G Codes:      [name] [name] 01-09-16, 3:47 PM

## 2015-12-14 NOTE — Progress Notes (Signed)
ANTICOAGULATION CONSULT NOTE - F/u Consult  Pharmacy Consult for heparin Indication: DVT  Allergies  Allergen Reactions  . Zetia [Ezetimibe] Anaphylaxis and Swelling    Tongue and throat   . Atorvastatin Other (See Comments)    Malaise & muscle weakness  . Penicillins Other (See Comments)    Unknown.  Tolerates cefepime  . Pravastatin Sodium     Pravastatin 40 mg qday and 40 mg q M/W/F caused muscle aches  . Lisinopril Rash  . Promethazine Rash   Patient Measurements: Height: 6\' 2"  (188 cm) Weight: 157 lb 10.1 oz (71.5 kg) IBW/kg (Calculated) : 82.2 Heparin Dosing Weight: 72kg  Vital Signs: Temp: 98.4 F (36.9 C) (01/19 0451) Temp Source: Oral (01/19 0451) BP: 126/83 mmHg (01/19 0451) Pulse Rate: 71 (01/19 0451)  Labs:  Recent Labs  12/12/15 0645 12/13/15 0530 12/14/15 0515  HGB 9.2* 8.3* 8.0*  HCT 28.1* 25.4* 24.8*  PLT 343 PLATELET CLUMPS NOTED ON SMEAR, UNABLE TO ESTIMATE 344  HEPARINUNFRC 0.60 0.53 0.67  CREATININE 1.28* 1.19 1.04    Estimated Creatinine Clearance: 73.5 mL/min (by C-G formula based on Cr of 1.04).  Assessment: 39 YOM known to pharmacy for TPN management and antibiotic dosing.  He is s/p ex lap on 1/6 and found to have perforated viscous and peritonitis. RUE doppler 1/11 reveals DVT of RUE along PICC site. PICC removed 1/11 and Left IJ placed.   Significant events: 1/11 baseline INR = 1.89, aPTT = 45sec 1/13 heparin held in AM for CT guided drain abscess drainage x 4 areas of fluid collection.  Heparin infusion resumed at previous rate (1800 units/hr) this afternoon at 1400 per IR instruction. Heparin level therapeutic after resumption of Heparin (0.40)  Today, 12/14/2015   Heparin level continues to be therapeutic on current rate of 2500 units/hr  Hgb low, continues to decrease, Plts ok  No bleeding reported/documented.  AKI - SCr improved to WNL  Goal of Therapy:  Heparin level 0.3-0.7 units/ml Monitor platelets by anticoagulation  protocol: Yes   Plan:   Continue heparin infusion at current rate of 2500 units/hr  Daily CBC and heparin level  Awaiting plan for long term anticoagulation   Dolly Rias RPh 12/14/2015, 7:21 AM Pager [phone]

## 2015-12-14 NOTE — Progress Notes (Signed)
Patient ID: [name] [name], male   DOB: [birth year], 65 y.o.   MRN: [id number] [name] PROGRESS NOTE  AJAY CHIAPPETTA [id number] DOB: 1951-01-16 DOA: 11/21/2015 PCP: Marijean Bravo, MD  Brief narrative:    65 year old very pleasant male with a past medical history of hyperlipidemia, hypertension, CAD, type 2 diabetes, diabetic peripheral neuropathy, seizure disorder, polysubstance abuse and chronic pain issues (on high doses of oxycodone prior to admission) who was admitted 11/22/15 with a small bowel obstruction. Surgical consultation was obtained on admission with conservative therapy initially recommended. Patient's bowel obstruction was felt to be from adhesions and possible narcotic bowel. His NG tube was removed 11/23/15 and he seemed to be improving clinically but pain returned 11/24/15 with diet advancement to clears. Repeat films showed worsening small bowel dilatation so his NG tube was reinserted. TPN was subsequently ordered. On 11/27/15, the patient refused PICC line insertion for TPN and surgical intervention. PICC finally placed 11/28/15 with initiation of TPN.   Further, patient has developed a right upper extremity DVT and currently is on IV heparin.  On 11/30/15, the patient's condition deteriorated acutely and he was found to be confused and restless. Rapid response was called. He was febrile and tachycardic with an elevated lactate level. Due to concerns for sepsis, broad-spectrum antibiotics were initiated with vancomycin and Primaxin which was switched to cefepime given his seizure history. Patient uderwent exploratory laparotomy on 12/01/15 with findings of perforated viscus with peritoneal contamination/peritonitis. He remained intubated postoperatively and was under the care of the critical care team. He self extubated 12/02/15. Surgery continues to follow, patient has an open abdominal wound. He continues to have an ileus and is on TNA at this time. [name]  assumed care 12/05/15.  Transferred to telemetry floor 12/08/2015.  Barrier to discharge: Slow improvement, ongoing pain, on TNA. Unable to predict when he will be ready for discharge but possible early next week.  Assessment/Plan:    Principal problem: SBO w/ perforated viscous and resultant peritonitis / leukocytosis / sepsis - General surgeon on board and managing  Active problems:  Severe Protein calorie malnutrition / underweight - In the context of acute on chronic illness - Diet advancement per Gen surgery - Continue nutritional support with TPN  Acute upper extremity DVT - US DVT right distal axillary vein, subclavian vein.  - Continue heparin drip - No reports of bleeding - continue to monitor cbc  Post operative respiratory failure with hypoxemia - Patient required ventilatory support after exploratory laparotomy 12/01/2015.  - Patient self extubated 12/02/2015 - Respiratory status has improved with Lasix 80 mg IV given 12/05/2015 - Respiratory status continues to be stable   Acute Encephalopathy in setting of sepsis / history of seizure disorder - EEG done 11/30/2015 showed no acute seizures - Load with Keppra if seizures occur but so far no seizures at all  Acute kidney injury - In the setting of sepsis and small bowel obstruction with perforated viscus - Creatinine normalized with hydration  Anemia of chronic disease / thrombocytopenia - Related to history of substance and alcohol use  - Hemoglobin trending down but stable. Will transfuse if < 8.0  Controlled, diabetes mellitus type 2 with peripheral circulatory complications without long-term insulin use - will continue to monitor CBG - Continue sliding scale insulin  Left lower extremity wound / S/p left fem pop bypass w/ non-healing ulcer - WOC assessment done   DVT Prophylaxis  - SCD's bilaterally in hospital   Code Status: Full.  Family Communication:  plan of care discussed with the patient,  family not at the bedside this am Disposition Plan: unable to predict when pt can be discharged, slowly improving   IV access:  Central line   Procedures and diagnostic studies from 12/05/2015:  Ct Abdomen Pelvis Wo Contrast 12/07/2015   1. There is small bilateral pleural effusion right greater than left. Significant atelectasis or infiltrate in right lower lobe posteriorly. There is superimposed mild interstitial prominence bilateral lower lobes probable mild interstitial edema. 2. There is moderate perihepatic and perisplenic ascites. Moderate ascites noted bilateral paracolic gutters. Please note there is some loculation of perihepatic ascites with mild mass effect on the liver contour please see axial images 32 and 15. Peritoneal inflammation or infection cannot be excluded. 3. No hydronephrosis or hydroureter.  Stable bilateral renal cysts. 4. Postsurgical changes are noted anterior abdominal wall. Incomplete healed/open subcutaneous midline anterior abdominal wound. 5. Mild distended small bowel loops containing oral contrast material and some air-fluid levels. There is no transition point in caliber of small bowel. Findings most likely due to significant ileus or residual partial small bowel obstruction. There is some oral contrast material noted within distal colon. 6. Moderate pelvic ascites. Moderate gas noted within mid sigmoid colon probable mild ileus. 7. Significant anasarca infiltration of subcutaneous fat abdominal and pelvic wall. 8. 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 Electronically Signed   By: Lahoma Crocker M.D.   On: 12/07/2015 15:18   Dg Chest Port 1 View 12/06/2015  Stable bilateral pulmonary edema, with right basilar subsegmental atelectasis and associated pleural effusion. Interval placement of left internal jugular catheter with distal tip in SVC. No pneumothorax is noted. Electronically Signed   By: Marijo Conception, M.D.   On: 12/06/2015 16:10   Dg Chest Port 1  View  12/05/2015   Worsening of pulmonary interstitial infiltrates bilaterally. This may reflect pulmonary edema of cardiac or noncardiac cause or could reflect pneumonia. There are small bilateral pleural effusions. Electronically Signed   By: David  Martinique M.D.   On: 12/05/2015 08:52   Exploratory laparotomy on 12/01/15   Medical Consultants:  Surgery  CCM transfer care to [facility] PCT 12/08/2015  Other Consultants:  Nutrition PT  IAnti-Infectives:   Flagyl 11-30-2015 --> Cefepime 12-02-2015 --> 12/10/2015    Velvet Bathe, MD  [name] Pager [phone]  Time spent in minutes: 25 minutes  If 7PM-7AM, please contact night-coverage [email or web address] Password [email or web address] 12/14/2015, 4:58 PM   LOS: 23 days    HPI/Subjective: No new complaints reported today  Objective: Filed Vitals:   12/13/15 2243 12/14/15 0451 12/14/15 1525 12/14/15 1539  BP: 152/91 126/83 131/77   Pulse: 75 71 77   Temp: 98.2 F (36.8 C) 98.4 F (36.9 C) 98.5 F (36.9 C)   TempSrc: Oral Oral Oral   Resp: 20 19 20    Height:      Weight:      SpO2: 95% 96% 90% 90%    Intake/Output Summary (Last 24 hours) at 12/14/15 1658 Last data filed at 12/14/15 1102  Gross per 24 hour  Intake    313 ml  Output   1570 ml  Net  -1257 ml    Exam:   General:  Pt is alert, awake and in nad  Cardiovascular: Rate controlled, (+) S1, S2 wnl  Respiratory:  No wheezing, no rhonchi  Abdomen: Has 3 JP drains, 2 on right side and one on left, tender to palpation, no rebound tenderness,  appreciate bowel sounds  Extremities: No edema right lower extremity, has left unaboot (+)  Neuro:  No focal deficits   Data Reviewed: Basic Metabolic Panel:  Recent Labs Lab 12/10/15 1310 12/11/15 0518 12/12/15 0645 12/13/15 0530 12/14/15 0515  NA 138 138 135 135 137  K 3.6 3.7 3.8 4.4 4.1  CL 101 104 101 101 103  CO2 29 27 27 25 28   GLUCOSE 116* 141* 82 119* 96  BUN 40* 38* 36* 30* 26*  CREATININE 1.30* 1.17 1.28*  1.19 1.04  CALCIUM 8.3* 8.2* 8.1* 8.1* 8.3*  MG  --  1.5* 1.8 1.5* 1.5*  PHOS  --  3.2 3.6 3.3 3.1   Liver Function Tests:  Recent Labs Lab 12/11/15 0518 12/14/15 0515  AST 21 21  ALT 11* 9*  ALKPHOS 74 101  BILITOT 0.9 0.5  PROT 5.8* 6.2*  ALBUMIN 1.9* 1.8*   No results for input(s): LIPASE, AMYLASE in the last 168 hours. No results for input(s): AMMONIA in the last 168 hours. CBC:  Recent Labs Lab 12/10/15 1310 12/11/15 0518 12/12/15 0645 12/13/15 0530 12/14/15 0515  WBC 14.4* 13.5* 13.5* 11.7* 10.8*  NEUTROABS  --  10.8*  --   --   --   HGB 8.4* 9.0* 9.2* 8.3* 8.0*  HCT 25.3* 26.8* 28.1* 25.4* 24.8*  MCV 86.3 85.6 87.0 85.8 87.0  PLT 254 309 343 PLATELET CLUMPS NOTED ON SMEAR, UNABLE TO ESTIMATE 344   Cardiac Enzymes: No results for input(s): CKTOTAL, CKMB, CKMBINDEX, TROPONINI in the last 168 hours. BNP: Invalid input(s): POCBNP CBG:  Recent Labs Lab 12/13/15 2054 12/14/15 0006 12/14/15 0443 12/14/15 0752 12/14/15 1206  GLUCAP 144* 121* 87 108* 144*    Recent Results (from the past 240 hour(s))  Culture, routine-abscess     Status: None   Collection Time: 12/08/15 12:41 PM  Result Value Ref Range Status   Specimen Description ABSCESS RLQ  Final   Special Requests NONE  Final   Gram Stain   Final    FEW WBC PRESENT,BOTH PMN AND MONONUCLEAR NO SQUAMOUS EPITHELIAL CELLS SEEN NO ORGANISMS SEEN Performed at [facility]    Culture   Final    NO GROWTH 2 DAYS Performed at [facility]    Report Status 12/10/2015 FINAL  Final  Anaerobic culture     Status: None   Collection Time: 12/08/15 12:41 PM  Result Value Ref Range Status   Specimen Description ABSCESS RLQ  Final   Special Requests NONE  Final   Gram Stain   Final    FEW WBC PRESENT,BOTH PMN AND MONONUCLEAR NO SQUAMOUS EPITHELIAL CELLS SEEN NO ORGANISMS SEEN Performed at [facility]    Culture   Final    NO ANAEROBES ISOLATED Performed at [facility]    Report Status 12/13/2015 FINAL  Final  Culture, routine-abscess     Status: None   Collection Time: 12/08/15 12:41 PM  Result Value Ref Range Status   Specimen Description ABSCESS PERIHEPATIC RUQ   Final   Special Requests NONE  Final   Gram Stain   Final    FEW WBC PRESENT, PREDOMINANTLY MONONUCLEAR NO SQUAMOUS EPITHELIAL CELLS SEEN NO ORGANISMS SEEN Performed at [facility]    Culture   Final    NO GROWTH 2 DAYS Performed at [facility]    Report Status 12/10/2015 FINAL  Final  Anaerobic culture     Status: None   Collection Time: 12/08/15 12:41 PM  Result Value Ref Range Status   Specimen Description ABSCESS PERIHEPATIC RUQ  Final   Special Requests NONE  Final   Gram Stain   Final    FEW WBC PRESENT, PREDOMINANTLY MONONUCLEAR NO SQUAMOUS EPITHELIAL CELLS SEEN NO ORGANISMS SEEN Performed at [facility]    Culture   Final    NO ANAEROBES ISOLATED Performed at [facility]    Report Status 12/13/2015 FINAL  Final  Culture, routine-abscess     Status: None   Collection Time: 12/08/15 12:41 PM  Result Value Ref Range Status   Specimen Description ABSCESS LUQ  Final   Special Requests NONE  Final   Gram Stain   Final    ABUNDANT WBC PRESENT,BOTH PMN AND MONONUCLEAR NO SQUAMOUS EPITHELIAL CELLS SEEN NO ORGANISMS SEEN Performed at [facility]    Culture   Final    NO GROWTH 2 DAYS Performed at [facility]    Report Status 12/10/2015 FINAL  Final  Anaerobic culture     Status: None   Collection Time: 12/08/15 12:41 PM  Result Value Ref Range Status   Specimen Description ABSCESS LUQ  Final   Special Requests NONE  Final   Gram Stain   Final    ABUNDANT WBC PRESENT,BOTH PMN AND MONONUCLEAR NO SQUAMOUS EPITHELIAL CELLS SEEN NO ORGANISMS SEEN Performed at [facility]    Culture   Final    NO ANAEROBES ISOLATED Performed at [facility]    Report Status 12/13/2015 FINAL  Final      Scheduled Meds: . albuterol  2.5 mg Nebulization Q6H  . antiseptic oral rinse  7 mL Mouth Rinse q12n4p  . bacitracin   Topical Daily  . chlorhexidine  15 mL Mouth Rinse BID  . collagenase  1 application Topical Daily  . insulin aspart  0-15 Units Subcutaneous 6 times per day  . lidocaine      . lip balm  1 application Topical BID  . metronidazole  500 mg Intravenous Q6H  . sodium chloride  3 mL Intravenous Q12H  . thiamine  100 mg Oral Daily   Continuous Infusions: . Marland KitchenTPN (CLINIMIX-E) Adult 65 mL/hr at 12/13/15 1757   And  . fat emulsion 240 mL (12/13/15 1757)  . Marland KitchenTPN (CLINIMIX-E) Adult     And  . fat emulsion    . heparin 2,500 Units/hr (12/14/15 1603)  . lactated ringers 80 mL/hr at 12/14/15 1045

## 2015-12-14 NOTE — Progress Notes (Signed)
PARENTERAL NUTRITION CONSULT NOTE - FOLLOW-UP  Pharmacy Consult for TPN Indication: Bowel obstruction, post op ileus  Allergies  Allergen Reactions  . Zetia [Ezetimibe] Anaphylaxis and Swelling    Tongue and throat   . Atorvastatin Other (See Comments)    Malaise & muscle weakness  . Penicillins Other (See Comments)    Unknown.  Tolerates cefepime  . Pravastatin Sodium     Pravastatin 40 mg qday and 40 mg q M/W/F caused muscle aches  . Lisinopril Rash  . Promethazine Rash   Patient Measurements: Height: 6\' 2"  (188 cm) Weight: 157 lb 10.1 oz (71.5 kg) IBW/kg (Calculated) : 82.2 Usual Weight: 137-140 lbs  Vital Signs: Temp: 98.4 F (36.9 C) (01/19 0451) Temp Source: Oral (01/19 0451) BP: 126/83 mmHg (01/19 0451) Pulse Rate: 71 (01/19 0451) Intake/Output from previous day: 01/18 0701 - 01/19 0700 In: 625 [P.O.:560] Out: 420 [Urine:350; Drains:70] Intake/Output from this shift: Total I/O In: 3 [I.V.:3] Out: 800 [Urine:800] Labs:  Recent Labs  12/12/15 0645 12/13/15 0530 12/14/15 0515  WBC 13.5* 11.7* 10.8*  HGB 9.2* 8.3* 8.0*  HCT 28.1* 25.4* 24.8*  PLT 343 PLATELET CLUMPS NOTED ON SMEAR, UNABLE TO ESTIMATE 344    Recent Labs  12/12/15 0645 12/13/15 0530 12/14/15 0515  NA 135 135 137  K 3.8 4.4 4.1  CL 101 101 103  CO2 27 25 28   GLUCOSE 82 119* 96  BUN 36* 30* 26*  CREATININE 1.28* 1.19 1.04  CALCIUM 8.1* 8.1* 8.3*  MG 1.8 1.5* 1.5*  PHOS 3.6 3.3 3.1  PROT  --   --  6.2*  ALBUMIN  --   --  1.8*  AST  --   --  21  ALT  --   --  9*  ALKPHOS  --   --  101  BILITOT  --   --  0.5   Estimated Creatinine Clearance: 73.5 mL/min (by C-G formula based on Cr of 1.04).    Recent Labs  12/14/15 0006 12/14/15 0443 12/14/15 0752  GLUCAP 121* 87 108*   Medical History: History reviewed. No pertinent past medical history. Medications:  Infusions:  . Marland KitchenTPN (CLINIMIX-E) Adult 65 mL/hr at 12/13/15 1757   And  . fat emulsion 240 mL (12/13/15 1757)  .  heparin 2,500 Units/hr (12/14/15 0515)  . lactated ringers 80 mL/hr at 12/13/15 1826   Insulin Requirements in the past 24 hours: 9 units SSI/24h + 10 units Regular insulin in TPN  Current Nutrition: full liquids  IVF: LR 80 ml/hr  Central access: PICC placed 1/3 TPN start date: 1/3  ASSESSMENT                                                                                                          HPI: 36 yoM presented to ED on 12/27 with abdominal pain.  PMH includes HTN, HLD, PVD - s/p fem bypass, CAD s/p NSTEMI 2014, cardiomyopathy, DM, diabetic peripheral neuropathy, seizures, and chronic pain on chronic opioids.  Surgical history significant for open AAA repair.  CT shows partial  SBO, likely adhesions.  Patient refused surgery, until 1/6 s/p repair of perforated visous.  Plan for conservative management with TPN, bowel rest.   Significant events:  1/1: TPN per pharmacy ordered - unable to start without central access.  1/3: PICC line placed; TPN started 1/5: Acute encephalopathy with fevers (102F) overnight- sepsis protocol initiated.  Broad-spectrum abx started.  1/6: Consented to laparotomy w/ findings of perforated viscus and peritonitis. Txfer to ICU   1/10: NGT removed 1/11: started clear liquid diet 1/13 NPO again for CT guided drain placement for perihepatic fluid collection suspicious for abscess. FL diet post-procedure 1/16: per CCS, no bowel sounds noted today, patient needs to be walked as much as possible 1/18: started full liquid diet, tolerating  Today 12/14/2015:   Glucose: at goal with insulin in TPN, Range 96-157,Hx DM on glipizide PTA.   Electrolytes - WNL, mag low. CCa 10  Renal - AKI; SCr trending down.  UOP  1.1L, 0.64 ml/kg/hr  I/O: -597 mL   Drains x3: output 152.5 ml  LFTs - AST/ALT and Tbili ok   TGs -  (Hx of HPL) 124 (1/2), 164 (1/9), 95 (1/16)  Prealbumin - 3.7 (1/2), < 2 (1/9), 2.7 (1/16)  NUTRITIONAL GOALS                                                                                              RD recs: 1600-1800 Kcal/day, 65-80 g protein/day, > 2 L fluid/day  Clinimix E 5/15 at a goal rate of 65 ml/hr + 20% fat emulsion at 10 ml/hr to provide: 78 g/day protein, 1588 Kcal/day.  PLAN                                                                                                    Mag Sulfate 1gm IV  X 1                       Continue Clinimix E 5/15 at goal rate of 65 ml/h  Continue with 10 units of insulin added to bag  Continue 20% fat emulsion at 10 ml/hr  Standard MVI, MTE in TPN bag.  Continue q6h CBGs with moderate correction insulin.  TPN lab panels on Mondays & Thursdays.    Bmet with Mag and Phos in am   Dolly Rias RPh 12/14/2015, 9:36 AM Pager [phone]

## 2015-12-15 ENCOUNTER — Inpatient Hospital Stay (HOSPITAL_COMMUNITY): Payer: Medicaid Other

## 2015-12-15 ENCOUNTER — Encounter (HOSPITAL_COMMUNITY): Payer: Self-pay

## 2015-12-15 LAB — PHOSPHORUS: PHOSPHORUS: 3.1 mg/dL (ref 2.5–4.6)

## 2015-12-15 LAB — CBC
HCT: 25.8 % — ABNORMAL LOW (ref 39.0–52.0)
HEMOGLOBIN: 8.5 g/dL — AB (ref 13.0–17.0)
MCH: 28.4 pg (ref 26.0–34.0)
MCHC: 32.9 g/dL (ref 30.0–36.0)
MCV: 86.3 fL (ref 78.0–100.0)
Platelets: UNDETERMINED 10*3/uL (ref 150–400)
RBC: 2.99 MIL/uL — AB (ref 4.22–5.81)
RDW: 15.3 % (ref 11.5–15.5)
WBC: 8.8 10*3/uL (ref 4.0–10.5)

## 2015-12-15 LAB — BASIC METABOLIC PANEL
Anion gap: 8 (ref 5–15)
BUN: 22 mg/dL — ABNORMAL HIGH (ref 6–20)
CHLORIDE: 99 mmol/L — AB (ref 101–111)
CO2: 28 mmol/L (ref 22–32)
CREATININE: 0.92 mg/dL (ref 0.61–1.24)
Calcium: 8.1 mg/dL — ABNORMAL LOW (ref 8.9–10.3)
Glucose, Bld: 104 mg/dL — ABNORMAL HIGH (ref 65–99)
POTASSIUM: 4.1 mmol/L (ref 3.5–5.1)
SODIUM: 135 mmol/L (ref 135–145)

## 2015-12-15 LAB — GLUCOSE, CAPILLARY
GLUCOSE-CAPILLARY: 133 mg/dL — AB (ref 65–99)
GLUCOSE-CAPILLARY: 114 mg/dL — AB (ref 65–99)
GLUCOSE-CAPILLARY: 133 mg/dL — AB (ref 65–99)
GLUCOSE-CAPILLARY: 125 mg/dL — AB (ref 65–99)
GLUCOSE-CAPILLARY: 102 mg/dL — AB (ref 65–99)

## 2015-12-15 LAB — TROPONIN I
TROPONIN I: 0.13 ng/mL — AB (ref <0.031)
Troponin I: <0.03 ng/mL (ref <0.031)
Troponin I: 0.11 ng/mL — ABNORMAL HIGH (ref <0.031)

## 2015-12-15 LAB — HEPARIN LEVEL (UNFRACTIONATED)
HEPARIN UNFRACTIONATED: 0.64 [IU]/mL (ref 0.30–0.70)
Heparin Unfractionated: 0.43 IU/mL (ref 0.30–0.70)

## 2015-12-15 LAB — MAGNESIUM: MAGNESIUM: 1.4 mg/dL — AB (ref 1.7–2.4)

## 2015-12-15 MED ORDER — FAT EMULSION 20 % IV EMUL
240.0000 mL | INTRAVENOUS | Status: AC
  Administered 2015-12-15: 240 mL via INTRAVENOUS
  Filled 2015-12-15: qty 250

## 2015-12-15 MED ORDER — FENTANYL CITRATE (PF) 100 MCG/2ML IJ SOLN
INTRAMUSCULAR | Status: AC | PRN
  Administered 2015-12-15: 25 ug via INTRAVENOUS

## 2015-12-15 MED ORDER — MIDAZOLAM HCL 2 MG/2ML IJ SOLN
INTRAMUSCULAR | Status: AC
  Filled 2015-12-15: qty 4

## 2015-12-15 MED ORDER — TRACE MINERALS CR-CU-MN-SE-ZN 10-1000-500-60 MCG/ML IV SOLN
INTRAVENOUS | Status: AC
  Administered 2015-12-15: 17:00:00 via INTRAVENOUS
  Filled 2015-12-15: qty 1560

## 2015-12-15 MED ORDER — MAGNESIUM SULFATE 2 GM/50ML IV SOLN
2.0000 g | Freq: Once | INTRAVENOUS | Status: AC
  Administered 2015-12-15: 2 g via INTRAVENOUS
  Filled 2015-12-15: qty 50

## 2015-12-15 MED ORDER — MIDAZOLAM HCL 2 MG/2ML IJ SOLN
INTRAMUSCULAR | Status: AC | PRN
  Administered 2015-12-15: 1 mg via INTRAVENOUS

## 2015-12-15 MED ORDER — FENTANYL CITRATE (PF) 100 MCG/2ML IJ SOLN
INTRAMUSCULAR | Status: AC
  Filled 2015-12-15: qty 2

## 2015-12-15 MED ORDER — METRONIDAZOLE 500 MG PO TABS
500.0000 mg | ORAL_TABLET | Freq: Four times a day (QID) | ORAL | Status: DC
  Administered 2015-12-15 – 2015-12-20 (×20): 500 mg via ORAL
  Filled 2015-12-15 (×28): qty 1

## 2015-12-15 NOTE — Progress Notes (Addendum)
Patient ID: [name] [name], male   DOB: November 20, 1951, 65 y.o.   MRN: [id number]     [facility]      [address] [place], [place] [address]    Phone: [phone] FAX: [phone]     Subjective: Little motivation. Asking for pain meds, but rather drowsy. Having BMs, c/o poor appetite.   Objective:  Vital signs:  Filed Vitals:   12/14/15 1756 12/14/15 1956 12/14/15 1957 12/15/15 0336  BP: 153/86  154/81 155/82  Pulse: 63  75 80  Temp: 98.3 F (36.8 C)  98.5 F (36.9 C) 99.6 F (37.6 C)  TempSrc: Oral  Oral Oral  Resp: 18  20 18   Height:      Weight:      SpO2: 93% 95% 94% 93%    Last BM Date: 12/14/15  Intake/Output   Yesterday:  01/19 0701 - 01/20 0700 In: 2468 [I.V.:1228; IV Piggyback:300; TPN:900] Out: 3225 [Urine:3000; Drains:225] This shift:      Physical Exam: General: Pt awake/alert/oriented x4 in no acute distress Abdomen: Soft.  Nondistended.  Midline wound is clean, bloody drainage at the base, retention sutures in place. 1 drain in place.   No evidence of peritonitis.  No incarcerated hernias.   Problem List:   Principal Problem:   Small bowel obstruction & perforation s/p OR exploration & repair 12/01/2015 Active Problems:   Protein-calorie malnutrition, severe (HCC)   Purulent peritonitis ([place])   Underweight   Acute respiratory failure with hypoxemia (HCC)   DVT of axillary vein, acute right   Intra-abdominal fluid collection   Palliative care encounter   Acute encephalopathy   Seizures (HCC)   Acute kidney injury ([place])   Controlled diabetes mellitus with circulatory complication, without long-term current use of insulin (HCC)   Anemia of chronic disease   Sepsis ([place])   Leukocytosis   Thrombocytopenia (HCC)   Pressure ulcer    Results:   Labs: Results for orders placed or performed during the hospital encounter of 11/21/15 (from the past 48 hour(s))  Glucose, capillary     Status:  Abnormal   Collection Time: 12/13/15 12:16 PM  Result Value Ref Range   Glucose-Capillary 126 (H) 65 - 99 mg/dL   Comment 1 Notify RN   Glucose, capillary     Status: Abnormal   Collection Time: 12/13/15  4:44 PM  Result Value Ref Range   Glucose-Capillary 157 (H) 65 - 99 mg/dL   Comment 1 Notify RN   Glucose, capillary     Status: Abnormal   Collection Time: 12/13/15  8:54 PM  Result Value Ref Range   Glucose-Capillary 144 (H) 65 - 99 mg/dL  Glucose, capillary     Status: Abnormal   Collection Time: 12/14/15 12:06 AM  Result Value Ref Range   Glucose-Capillary 121 (H) 65 - 99 mg/dL  Glucose, capillary     Status: None   Collection Time: 12/14/15  4:43 AM  Result Value Ref Range   Glucose-Capillary 87 65 - 99 mg/dL  Comprehensive metabolic panel     Status: Abnormal   Collection Time: 12/14/15  5:15 AM  Result Value Ref Range   Sodium 137 135 - 145 mmol/L   Potassium 4.1 3.5 - 5.1 mmol/L   Chloride 103 101 - 111 mmol/L   CO2 28 22 - 32 mmol/L   Glucose, Bld 96 65 - 99 mg/dL   BUN 26 (H) 6 - 20 mg/dL   Creatinine, Ser  1.04 0.61 - 1.24 mg/dL   Calcium 8.3 (L) 8.9 - 10.3 mg/dL   Total Protein 6.2 (L) 6.5 - 8.1 g/dL   Albumin 1.8 (L) 3.5 - 5.0 g/dL   AST 21 15 - 41 U/L   ALT 9 (L) 17 - 63 U/L   Alkaline Phosphatase 101 38 - 126 U/L   Total Bilirubin 0.5 0.3 - 1.2 mg/dL   GFR calc non Af Amer >60 >60 mL/min   GFR calc Af Amer >60 >60 mL/min    Comment: (NOTE) The eGFR has been calculated using the CKD EPI equation. This calculation has not been validated in all clinical situations. eGFR's persistently <60 mL/min signify possible Chronic Kidney Disease.    Anion gap 6 5 - 15  Magnesium     Status: Abnormal   Collection Time: 12/14/15  5:15 AM  Result Value Ref Range   Magnesium 1.5 (L) 1.7 - 2.4 mg/dL  Phosphorus     Status: None   Collection Time: 12/14/15  5:15 AM  Result Value Ref Range   Phosphorus 3.1 2.5 - 4.6 mg/dL  CBC     Status: Abnormal   Collection Time:  12/14/15  5:15 AM  Result Value Ref Range   WBC 10.8 (H) 4.0 - 10.5 K/uL   RBC 2.85 (L) 4.22 - 5.81 MIL/uL   Hemoglobin 8.0 (L) 13.0 - 17.0 g/dL   HCT 24.8 (L) 39.0 - 52.0 %   MCV 87.0 78.0 - 100.0 fL   MCH 28.1 26.0 - 34.0 pg   MCHC 32.3 30.0 - 36.0 g/dL   RDW 15.3 11.5 - 15.5 %   Platelets 344 150 - 400 K/uL  Heparin level (unfractionated)     Status: None   Collection Time: 12/14/15  5:15 AM  Result Value Ref Range   Heparin Unfractionated 0.67 0.30 - 0.70 IU/mL    Comment:        IF HEPARIN RESULTS ARE BELOW EXPECTED VALUES, AND PATIENT DOSAGE HAS BEEN CONFIRMED, SUGGEST FOLLOW UP TESTING OF ANTITHROMBIN III LEVELS.   Glucose, capillary     Status: Abnormal   Collection Time: 12/14/15  7:52 AM  Result Value Ref Range   Glucose-Capillary 108 (H) 65 - 99 mg/dL  Glucose, capillary     Status: Abnormal   Collection Time: 12/14/15 12:06 PM  Result Value Ref Range   Glucose-Capillary 144 (H) 65 - 99 mg/dL  Glucose, capillary     Status: None   Collection Time: 12/14/15  6:02 PM  Result Value Ref Range   Glucose-Capillary 78 65 - 99 mg/dL  Glucose, capillary     Status: Abnormal   Collection Time: 12/14/15  8:01 PM  Result Value Ref Range   Glucose-Capillary 115 (H) 65 - 99 mg/dL  Glucose, capillary     Status: Abnormal   Collection Time: 12/14/15 11:49 PM  Result Value Ref Range   Glucose-Capillary 139 (H) 65 - 99 mg/dL  Glucose, capillary     Status: Abnormal   Collection Time: 12/15/15  3:30 AM  Result Value Ref Range   Glucose-Capillary 133 (H) 65 - 99 mg/dL  CBC     Status: Abnormal   Collection Time: 12/15/15  5:30 AM  Result Value Ref Range   WBC 8.8 4.0 - 10.5 K/uL    Comment: WHITE COUNT CONFIRMED ON SMEAR   RBC 2.99 (L) 4.22 - 5.81 MIL/uL   Hemoglobin 8.5 (L) 13.0 - 17.0 g/dL   HCT 25.8 (L) 39.0 - 52.0 %  MCV 86.3 78.0 - 100.0 fL   MCH 28.4 26.0 - 34.0 pg   MCHC 32.9 30.0 - 36.0 g/dL   RDW 15.3 11.5 - 15.5 %   Platelets PLATELET CLUMPS NOTED ON SMEAR,  UNABLE TO ESTIMATE 150 - 400 K/uL  Heparin level (unfractionated)     Status: None   Collection Time: 12/15/15  5:30 AM  Result Value Ref Range   Heparin Unfractionated 0.43 0.30 - 0.70 IU/mL    Comment:        IF HEPARIN RESULTS ARE BELOW EXPECTED VALUES, AND PATIENT DOSAGE HAS BEEN CONFIRMED, SUGGEST FOLLOW UP TESTING OF ANTITHROMBIN III LEVELS.   Basic metabolic panel     Status: Abnormal   Collection Time: 12/15/15  5:30 AM  Result Value Ref Range   Sodium 135 135 - 145 mmol/L   Potassium 4.1 3.5 - 5.1 mmol/L   Chloride 99 (L) 101 - 111 mmol/L   CO2 28 22 - 32 mmol/L   Glucose, Bld 104 (H) 65 - 99 mg/dL   BUN 22 (H) 6 - 20 mg/dL   Creatinine, Ser 0.92 0.61 - 1.24 mg/dL   Calcium 8.1 (L) 8.9 - 10.3 mg/dL   GFR calc non Af Amer >60 >60 mL/min   GFR calc Af Amer >60 >60 mL/min    Comment: (NOTE) The eGFR has been calculated using the CKD EPI equation. This calculation has not been validated in all clinical situations. eGFR's persistently <60 mL/min signify possible Chronic Kidney Disease.    Anion gap 8 5 - 15  Magnesium     Status: Abnormal   Collection Time: 12/15/15  5:30 AM  Result Value Ref Range   Magnesium 1.4 (L) 1.7 - 2.4 mg/dL  Phosphorus     Status: None   Collection Time: 12/15/15  5:30 AM  Result Value Ref Range   Phosphorus 3.1 2.5 - 4.6 mg/dL  Troponin I (q 6hr x 3)     Status: None   Collection Time: 12/15/15  5:30 AM  Result Value Ref Range   Troponin I <0.03 <0.031 ng/mL    Comment:        NO INDICATION OF MYOCARDIAL INJURY.   Glucose, capillary     Status: Abnormal   Collection Time: 12/15/15  7:32 AM  Result Value Ref Range   Glucose-Capillary 114 (H) 65 - 99 mg/dL    Imaging / Studies: Ct Abdomen Pelvis Wo Contrast  12/14/2015  CLINICAL DATA:  Status post laparoscopic surgery and repair of small bowel perforation. EXAM: CT ABDOMEN AND PELVIS WITHOUT CONTRAST TECHNIQUE: Multidetector CT imaging of the abdomen and pelvis was performed  following the standard protocol without IV contrast. COMPARISON:  CT scan of December 07, 2015. FINDINGS: Severe degenerative disc disease is noted at L5-S1. Mild left pleural effusion is noted with adjacent subsegmental atelectasis. Moderate right pleural effusion is noted with associated atelectasis or pneumonia of the right middle and lower lobes. No gallstones are noted. No focal abnormality is noted in the liver, spleen or pancreas on these unenhanced images. Adrenal glands are unremarkable. Stable large right renal cyst is noted. No hydronephrosis or renal obstruction is noted. Status post aortobifemoral bypass graft placement for treatment of abdominal aortic aneurysm. There is been interval placement of percutaneous drainage into perihepatic fluid collection. This fluid collection is slightly smaller compared to prior exam. There has also been interval placement of percutaneous drainage catheter into left upper quadrant fluid collection which is significantly smaller compared to prior exam. There  is also been interval placement of percutaneous drainage catheter in right lateral peritoneal fluid collection, which is also significantly smaller compared to prior exam. Large amount of contrast is seen throughout the colon which is nondilated. Diffuse wall and fold thickening is noted throughout multiple loops of small bowel suggesting inflammation or enteritis. No significant small bowel dilatation is noted. Urinary bladder appears normal. Moderate anasarca is again noted. Minimal to mild residual ascites is noted. IMPRESSION: Bilateral pleural effusions are noted, with right greater than left. There appears to be some degree of atelectasis or pneumonia involving the right lower and middle lobes. Status post interval placement of 3 percutaneous drainage catheters. There is 1 in the perihepatic fluid collection, which is slightly smaller compared to prior exam. Another is noted in left upper quadrant fluid  collection which is significantly smaller compared to prior exam. A third is noted in right lateral peritoneal fluid collection, which is also significantly smaller and nearly resolved compared to prior exam. Large amount of contrast is seen throughout the colon which is nondilated. Diffuse wall and fold thickening is seen throughout multiple loops small bowel suggesting inflammation or enteritis, but no significant bowel dilatation is noted Electronically Signed   By: Marijo Conception, M.D.   On: 12/14/2015 14:09   Dg Chest 2 View  12/14/2015  CLINICAL DATA:  65 year old male with postoperative respiratory failure, hypoxemia and sepsis. EXAM: CHEST  2 VIEW COMPARISON:  12/06/2015 chest radiograph.  12/14/2015 abdominal CT. FINDINGS: The cardiomediastinal silhouette is unchanged. A left IJ central venous catheter with tip overlying the lower SVC again noted. Moderate bilateral pleural effusions have increased in size, right greater than left. Increasing bilateral lower lung atelectasis/ consolidation noted. There is no evidence of pneumothorax. Pulmonary vascular congestion is present. IMPRESSION: Enlarging moderate bilateral pleural effusions, right greater than left, with increasing bilateral lower lung atelectasis/ consolidation. Pulmonary vascular congestion. Electronically Signed   By: Margarette Canada M.D.   On: 12/14/2015 19:11   Ir Sinus/fist Tube Chk-non Gi  12/15/2015  CLINICAL DATA:  65 yo male with a history of perforated small bowel and acute abdomen status post exploratory laparotomy, washout and small bowel repair. Rib his postoperative course has been complicated by development of multiple intra-abdominal abscesses. Percutaneous drainage was performed 12/08/2015 with placement of 3 drainage catheters. CT imaging earlier today demonstrates that the right lower quadrant drainage catheter has successfully drained at the intended fluid collection. This drain can be removed. Both of the upper quadrant  drainage catheters need to be repositioned into the subdiaphragmatic spaces to drain persistent fluid in those regions. EXAM: SINUS TRACT INJECTION/FISTULOGRAM Date: 12/15/2015 PROCEDURE: 1. Removal of right lower quadrant drain 2. Attempted repositioning of right upper quadrant drain with ultimate removal of drainage catheter 3. Successful repositioning and up size of left upper quadrant drain Interventional Radiologist:  Criselda Peaches, MD ANESTHESIA/SEDATION: 50 mcg fentanyl administered during the course of the procedure MEDICATIONS: None additional FLUOROSCOPY TIME:  11 minutes 30 seconds for a total of 167 mGy CONTRAST:  63m OMNIPAQUE IOHEXOL 300 MG/ML  SOLN TECHNIQUE: Informed consent was obtained from the patient following explanation of the procedure, risks, benefits and alternatives. The patient understands, agrees and consents for the procedure. All questions were addressed. A time out was performed. Maximal barrier sterile technique utilized including caps, mask, sterile gowns, sterile gloves, large sterile drape, hand hygiene, and Betadine skin prep. The right lower quadrant drainage catheter was cut and removed. Contrast was injected through the right upper  quadrant drainage catheter. This confirms that there is no residual abscess cavity at the drainage a loop of the catheter. However, shortly beyond the entry point of the catheter into the abdominal wall there is a communication with a larger fluid collection in the subdiaphragmatic space. This catheter was cut and removed over a Bentson wire. A 5 French catheter was navigated over the wire. Multiple attempts with several wires were made in an effort to reposition the wire into the subdiaphragmatic space. This was ultimately unsuccessful. The decision was made to pursue replacement of the right upper quadrant drainage catheter under CT guidance. Attention was turned to the left upper quadrant drainage catheter. Contrast was injected, again,  shortly beyond the entry point into the left upper quadrant there is a communication with the larger subdiaphragmatic fluid collection. The fluid collection around the draining loop of the catheter has resolved. This catheter was cut and removed over a Bentson wire. Using a 5 French angled catheter and glidewire, the eat wire was successfully navigated into did subdiaphragmatic space. The catheter was advanced into the space in the glidewire exchanged for an Amplatz wire. A Cook 12 [place] all-purpose drainage catheter was advanced over the wire and formed in the subdiaphragmatic space. There was immediate return of approximately 150 cc of opaque tan fluid and debris. The catheter was secured in place with 0 Prolene suture and connected to JP bulb drainage. Bandages were applied. The patient tolerated the procedure well. COMPLICATIONS: None IMPRESSION: 1. The right lower quadrant drain was removed. 2. Unsuccessful attempted repositioning of the right upper quadrant drainage catheter. The catheter could not be manipulated up into the subdiaphragmatic space and was therefore removed. This catheter will be replaced under CT guidance tomorrow 12/14/14. 3. Successful repositioning of the left upper quadrant drainage catheter into the subdiaphragmatic collection with return of copious opaque tan colored fluid and debris. Signed, Criselda Peaches, MD [facility] Radiology Electronically Signed   By: Jacqulynn Cadet M.D.   On: 12/15/2015 07:49    Medications / Allergies:  Scheduled Meds: . albuterol  2.5 mg Nebulization BID  . antiseptic oral rinse  7 mL Mouth Rinse q12n4p  . bacitracin   Topical Daily  . chlorhexidine  15 mL Mouth Rinse BID  . collagenase  1 application Topical Daily  . fentaNYL (SUBLIMAZE) injection  50 mcg Intravenous Once  . insulin aspart  0-15 Units Subcutaneous 6 times per day  . lip balm  1 application Topical BID  . metroNIDAZOLE  500  mg Oral Q6H  . sodium chloride  3 mL Intravenous Q12H  . thiamine  100 mg Oral Daily   Continuous Infusions: . Marland KitchenTPN (CLINIMIX-E) Adult 65 mL/hr at 12/14/15 1800   And  . fat emulsion 240 mL (12/14/15 1801)  . Marland KitchenTPN (CLINIMIX-E) Adult     And  . fat emulsion    . heparin 2,500 Units/hr (12/15/15 0319)  . lactated ringers 80 mL/hr at 12/15/15 0330   PRN Meds:.acetaminophen, albuterol, alum & mag hydroxide-simeth, bisacodyl, diphenhydrAMINE, hydrALAZINE, HYDROmorphone (DILAUDID) injection, ketorolac, magic mouthwash, menthol-cetylpyridinium, ondansetron (ZOFRAN) IV, oxyCODONE, phenol, sodium chloride, sodium chloride, sodium chloride, triamcinolone cream  Antibiotics: Anti-infectives    Start     Dose/Rate Route Frequency Ordered Stop   12/15/15 0800  metroNIDAZOLE (FLAGYL) tablet 500 mg     500 mg Oral Every 6 hours 12/15/15 0714     12/08/15 0800  metroNIDAZOLE (FLAGYL) IVPB 500 mg  Status:  Discontinued  500 mg 100 mL/hr over 60 Minutes Intravenous Every 6 hours 12/08/15 0737 12/15/15 0714   12/03/15 1800  vancomycin (VANCOCIN) IVPB 750 mg/150 ml premix  Status:  Discontinued     750 mg 150 mL/hr over 60 Minutes Intravenous Every 24 hours 12/03/15 1715 12/04/15 0911   12/02/15 1800  ceFEPIme (MAXIPIME) 1 g in dextrose 5 % 50 mL IVPB     1 g 100 mL/hr over 30 Minutes Intravenous Every 12 hours 12/02/15 1014 12/09/15 1759   12/02/15 0600  vancomycin (VANCOCIN) IVPB 1000 mg/200 mL premix  Status:  Discontinued     1,000 mg 200 mL/hr over 60 Minutes Intravenous 3 times per day 12/02/15 0547 12/02/15 0954   12/01/15 2200  ceFEPIme (MAXIPIME) 1 g in dextrose 5 % 50 mL IVPB  Status:  Discontinued     1 g 100 mL/hr over 30 Minutes Intravenous 3 times per day 12/01/15 1455 12/02/15 1014   11/30/15 1400  ceFEPIme (MAXIPIME) 1 g in dextrose 5 % 50 mL IVPB  Status:  Discontinued     1 g 100 mL/hr over 30 Minutes Intravenous 3 times per day 11/30/15 0913 12/01/15 1455   11/30/15 1000   metroNIDAZOLE (FLAGYL) IVPB 500 mg     500 mg 100 mL/hr over 60 Minutes Intravenous Every 8 hours 11/30/15 0858 12/07/15 0226   11/30/15 0645  vancomycin (VANCOCIN) IVPB 750 mg/150 ml premix  Status:  Discontinued     750 mg 150 mL/hr over 60 Minutes Intravenous 3 times per day 11/30/15 0630 12/02/15 0547   11/30/15 0645  imipenem-cilastatin (PRIMAXIN) 500 mg in sodium chloride 0.9 % 100 mL IVPB  Status:  Discontinued     500 mg 200 mL/hr over 30 Minutes Intravenous 3 times per day 11/30/15 0630 11/30/15 0859       Assessment/Plan POD#14 exlap, and repair of SB perforation in setting of SBO--Dr. Hassell Done 12/01/15 -pulmonary toilet, mobilize  -BID wet to dry dressing changes to open wound/retention sutures Post op intra-abdominal abscesses-2/3 drains removed.  Attempted to reposition RUQ drain, but removed.  upsized and repositioned llq drain.  ? IR drain placement today to RUQ fluid collection.  Post op ileus-resolved ID-on flagyl. only IR abscess culture NGTD RUE DVT-heparin gtt PCM-TPN until PO is adequate. Prealbumin is 2.7(1/16) FEN-advance to soft pending procedure  Dispo-continue inpatient.   Erby Pian, [facility] Surgery Pager [phone](7A-4:30P)   Gen. surgery attending addendum:  Patient interviewed and examined.  Agree with assessment and treatment plan detailed above His abdomen is soft.  A little bit tender but not any different than the last several days.  Doesn't seem distended. Sided drain with purulent fluid.  Cultures pending. Hopefully he will return to interventional radiology today to get a drain in the right upper quadrant and subphrenic collection.  Chest x-ray shows bilateral pleural effusions and right lower lobe atelectasis.  I suspect this is a sympathetic response to his subdiaphragmatic infection.  Encourage walking, incentive spirometry, albuterol inhalers.  He is eating poorly and is supported with TNA currently.   12/15/2015  9:34 AM

## 2015-12-15 NOTE — Procedures (Signed)
CT guided RUQ drain placement No complication No blood loss. See complete dictation in [facility].

## 2015-12-15 NOTE — Progress Notes (Signed)
Patient has requested full aggressive treatment. Acute post op pain is being managed with slow recovery expected. Palliative will sign off. Please call for any further palliative needs #[phone].  Vinie Sill, NP Palliative Medicine Team Pager # [phone] (M-F 8a-5p) Team Phone # [phone] (Nights/Weekends)

## 2015-12-15 NOTE — Progress Notes (Signed)
Nutrition Follow-up  DOCUMENTATION CODES:   Not applicable  INTERVENTION:  - Continue TPN per pharmacy - Diet advancement as medically feasible - RD will continue to monitor for needs  NUTRITION DIAGNOSIS:   Inadequate oral intake related to inability to eat as evidenced by NPO status. -revised, ongoing  GOAL:   Patient will meet greater than or equal to 90% of their needs -met with TPN   MONITOR:   Diet advancement, Weight trends, Labs, Skin, I & O's, Other (Comment) (TPN regimen)  ASSESSMENT:   65-year-old male with a past medical history of hyperlipidemia, hypertension, CAD, type 2 diabetes, diabetic peripheral neuropathy, seizure disorder, polysubstance abuse and chronic pain issues (on high doses of oxycodone prior to admission) who was admitted 11/22/15 with a small bowel obstruction. Surgical consultation was obtained on admission with conservative therapy initially recommended. Patient's bowel obstruction was felt to be from adhesions and possible narcotic bowel. His NG tube was removed 11/23/15 and he seemed to be improving clinically but pain returned 11/24/15 with diet advancement to clears. Repeat films showed worsening small bowel dilatation so his NG tube was reinserted. TPN was subsequently ordered. On 11/27/15, the patient refused PICC line insertion for TPN and surgical intervention. PICC finally placed 11/28/15 with initiation of TPN.   1/20 Pt was previously on FLD but transitioned back to NPO this AM. At time of visit, pt reports very severe abdominal pain. Pt is POD #14 exlap and repair of small bowel perforation which resulted from SBO. Post-op ileus has resolved.  Pt currently receiving Clinimix E 5/15 @ 65 mL/hr with 20% lipids @ 10 mL/hr which is providing 1588 kcal (99% minimum estimated kcal needs) and 78 grams of protein. Meeting needs with TPN alone.   Per pharmacy note this AM:  Mag Sulfate 2 grams IV x 1  Continue Clinimix E 5/15 at goal rate of 65  ml/h  Continue with 10 units of insulin / 24 hr added to bag  Continue 20% fat emulsion at 10 ml/hr  Standard MVI, MTE in TPN bag.  Continue q6h CBGs with moderate correction insulin.  TPN lab panels on Mondays & Thursdays.   Follow for potential advancement in diet when appropriate  Bmet with Mag and Phos in am  Medications reviewed. Labs reviewed; CBGs: 78-157 mg/dL, Cl: 99 mmol/L, BUN: 22 mg/dL, Ca: 8.1 mg/dL, Mg: 1.4 mg/dL.    1/17 - Pt has been on CLD since 1/14 with no documented intakes.  - Breakfast tray on bedside table at time of visit and pt had consumed 100% of chicken broth and all other items were untouched.  - Pt reports this is the most he has consumed PO in 5 weeks.  - Pt states severe abdominal pain following intake this AM. - Encouraged pt to consume something PO at each meal, even if he is only able to consume 1 spoonful.  - Pt states ongoing abdominal pain both with and without PO intakes. - Pt currently receiving Clinimix E 5/15 @ 65 mL/hr with 20% lipids @ 10 mL/hr which is providing 1588 kcal (99% minimum estimated kcal needs) and 78 grams of protein. - Per pharmacy note today at 0955:  Continue Clinimix E 5/15 at goal rate of 65 ml/h  Reduce insulin from 20 units over 24hr to 10 units over 24hrs  Continue 20% fat emulsion at 10 ml/hr  Standard MVI, MTE in TPN bag.  Continue q6h CBGs with moderate correction insulin.  TPN lab panels on Mondays & Thursdays.   BMET, mag, phos in AM.  1/12 - RD attempted to see patient to complete NFPE.  - Tech outside of room let RD know pt is on the bedpan. Will attempt at follow-up. - Pt continues on TPN an diet has been advanced to full liquids.  - Plan per Pharmacy:  Continue Clinimix E 5/15 at goal rate of 65 ml/h  Continue 20% fat emulsion at 10 ml/hr   Diet Order:  TPN (CLINIMIX-E) Adult TPN (CLINIMIX-E) Adult Diet NPO time specified Except for: Sips with Meds  Skin:  Wound (see comment) (L foot  and toe DM ulcers)  Last BM:  1/19  Height:   Ht Readings from Last 1 Encounters:  12/01/15 6' 2" (1.88 m)    Weight:   Wt Readings from Last 1 Encounters:  12/08/15 157 lb 10.1 oz (71.5 kg)    Ideal Body Weight:  86.36 kg (kg)  BMI:  Body mass index is 20.23 kg/(m^2).  Estimated Nutritional Needs:   Kcal:  1600-1800  Protein:  65-80 grams  Fluid:  >/= 2 L/day  EDUCATION NEEDS:   No education needs identified at this time      , RD, LDN Inpatient Clinical Dietitian Pager # [phone] After hours/weekend pager # [phone]  

## 2015-12-15 NOTE — Progress Notes (Signed)
Patient ID: [name] [name], male   DOB: [birth year], 65 y.o.   MRN: [id number] [name] PROGRESS NOTE  [name] [name] [id number] DOB: 1951/10/01 DOA: 11/21/2015 PCP: Marijean Bravo, MD  Brief narrative:    65 year old very pleasant male with a past medical history of hyperlipidemia, hypertension, CAD, type 2 diabetes, diabetic peripheral neuropathy, seizure disorder, polysubstance abuse and chronic pain issues (on high doses of oxycodone prior to admission) who was admitted 11/22/15 with a small bowel obstruction. Surgical consultation was obtained on admission with conservative therapy initially recommended. Patient's bowel obstruction was felt to be from adhesions and possible narcotic bowel. His NG tube was removed 11/23/15 and he seemed to be improving clinically but pain returned 11/24/15 with diet advancement to clears. Repeat films showed worsening small bowel dilatation so his NG tube was reinserted. TPN was subsequently ordered. On 11/27/15, the patient refused PICC line insertion for TPN and surgical intervention. PICC finally placed 11/28/15 with initiation of TPN.   Further, patient has developed a right upper extremity DVT and currently is on IV heparin.  On 11/30/15, the patient's condition deteriorated acutely and he was found to be confused and restless. Rapid response was called. He was febrile and tachycardic with an elevated lactate level. Due to concerns for sepsis, broad-spectrum antibiotics were initiated with vancomycin and Primaxin which was switched to cefepime given his seizure history. Patient uderwent exploratory laparotomy on 12/01/15 with findings of perforated viscus with peritoneal contamination/peritonitis. He remained intubated postoperatively and was under the care of the critical care team. He self extubated 12/02/15. Surgery continues to follow, patient has an open abdominal wound. He continues to have an ileus and is on TNA at this time. [name]  assumed care 12/05/15.  Transferred to telemetry floor 12/08/2015.  Barrier to discharge: Slow improvement, ongoing pain, on TNA. Unable to predict when he will be ready for discharge but possible early next week.  Assessment/Plan:    Principal problem: SBO w/ perforated viscous and resultant peritonitis / leukocytosis / sepsis - General surgeon on board and managing - please refer to their notes for details  Active problems:  Severe Protein calorie malnutrition / underweight - In the context of acute on chronic illness - Diet advancement per Gen surgery - Continue nutritional support with TPN  Acute upper extremity DVT - US DVT right distal axillary vein, subclavian vein.  - Continue heparin drip - No reports of bleeding - continue to monitor cbc  Post operative respiratory failure with hypoxemia - Patient required ventilatory support after exploratory laparotomy 12/01/2015.  - Patient self extubated 12/02/2015 - Respiratory status has improved with Lasix 80 mg IV given 12/05/2015 - Respiratory status continues to be stable   Acute Encephalopathy in setting of sepsis / history of seizure disorder - EEG done 11/30/2015 showed no acute seizures - Load with Keppra if seizures occur but so far no seizures at all  Acute kidney injury - In the setting of sepsis and small bowel obstruction with perforated viscus - Creatinine normalized with hydration  Anemia of chronic disease / thrombocytopenia - Related to history of substance and alcohol use  - Hemoglobin trending down but stable. Will transfuse if < 8.0  Controlled, diabetes mellitus type 2 with peripheral circulatory complications without long-term insulin use - will continue to monitor CBG - Continue sliding scale insulin  Left lower extremity wound / S/p left fem pop bypass w/ non-healing ulcer - WOC assessment done   DVT Prophylaxis  - SCD's bilaterally  in hospital   Code Status: Full.  Family Communication:   plan of care discussed with the patient, family not at the bedside this am Disposition Plan: unable to predict when pt can be discharged, slowly improving   IV access:  Central line   Procedures and diagnostic studies from 12/05/2015:  Ct Abdomen Pelvis Wo Contrast 12/07/2015   1. There is small bilateral pleural effusion right greater than left. Significant atelectasis or infiltrate in right lower lobe posteriorly. There is superimposed mild interstitial prominence bilateral lower lobes probable mild interstitial edema. 2. There is moderate perihepatic and perisplenic ascites. Moderate ascites noted bilateral paracolic gutters. Please note there is some loculation of perihepatic ascites with mild mass effect on the liver contour please see axial images 32 and 15. Peritoneal inflammation or infection cannot be excluded. 3. No hydronephrosis or hydroureter.  Stable bilateral renal cysts. 4. Postsurgical changes are noted anterior abdominal wall. Incomplete healed/open subcutaneous midline anterior abdominal wound. 5. Mild distended small bowel loops containing oral contrast material and some air-fluid levels. There is no transition point in caliber of small bowel. Findings most likely due to significant ileus or residual partial small bowel obstruction. There is some oral contrast material noted within distal colon. 6. Moderate pelvic ascites. Moderate gas noted within mid sigmoid colon probable mild ileus. 7. Significant anasarca infiltration of subcutaneous fat abdominal and pelvic wall. 8. 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 Electronically Signed   By: Lahoma Crocker M.D.   On: 12/07/2015 15:18   Dg Chest Port 1 View 12/06/2015  Stable bilateral pulmonary edema, with right basilar subsegmental atelectasis and associated pleural effusion. Interval placement of left internal jugular catheter with distal tip in SVC. No pneumothorax is noted. Electronically Signed   By: Marijo Conception, M.D.    On: 12/06/2015 16:10   Dg Chest Port 1 View  12/05/2015   Worsening of pulmonary interstitial infiltrates bilaterally. This may reflect pulmonary edema of cardiac or noncardiac cause or could reflect pneumonia. There are small bilateral pleural effusions. Electronically Signed   By: David  Martinique M.D.   On: 12/05/2015 08:52   Exploratory laparotomy on 12/01/15   Medical Consultants:  Surgery  CCM transfer care to [facility] PCT 12/08/2015  Other Consultants:  Nutrition PT  IAnti-Infectives:   Flagyl 11-30-2015 --> Cefepime 12-02-2015 --> 12/10/2015    Velvet Bathe, MD  [name] Pager [phone]  Time spent in minutes: 25 minutes  If 7PM-7AM, please contact night-coverage [email or web address] Password [email or web address] 12/15/2015, 4:51 PM   LOS: 24 days    HPI/Subjective: No new complaints reported today  Objective: Filed Vitals:   12/15/15 1157 12/15/15 1229 12/15/15 1301 12/15/15 1339  BP: 136/83 152/81 138/85 146/79  Pulse: 80 77 80 81  Temp: 98.1 F (36.7 C) 99.2 F (37.3 C) 98.2 F (36.8 C) 98 F (36.7 C)  TempSrc: Oral Oral Oral Oral  Resp: 15 17 15 18   Height:      Weight:      SpO2: 97% 97% 93% 93%    Intake/Output Summary (Last 24 hours) at 12/15/15 1651 Last data filed at 12/15/15 1430  Gross per 24 hour  Intake   2320 ml  Output   1820 ml  Net    500 ml    Exam:   General:  Pt is alert, awake and in nad  Cardiovascular: Rate controlled, (+) S1, S2 wnl  Respiratory:  No wheezing, no rhonchi  Abdomen: Has 2 JP drains, no rebound  tenderness, appreciate bowel sounds  Extremities: No edema right lower extremity, has left unaboot (+)  Neuro:  No focal deficits   Data Reviewed: Basic Metabolic Panel:  Recent Labs Lab 12/11/15 0518 12/12/15 0645 12/13/15 0530 12/14/15 0515 12/15/15 0530  NA 138 135 135 137 135  K 3.7 3.8 4.4 4.1 4.1  CL 104 101 101 103 99*  CO2 27 27 25 28 28   GLUCOSE 141* 82 119* 96 104*  BUN 38* 36* 30* 26* 22*  CREATININE  1.17 1.28* 1.19 1.04 0.92  CALCIUM 8.2* 8.1* 8.1* 8.3* 8.1*  MG 1.5* 1.8 1.5* 1.5* 1.4*  PHOS 3.2 3.6 3.3 3.1 3.1   Liver Function Tests:  Recent Labs Lab 12/11/15 0518 12/14/15 0515  AST 21 21  ALT 11* 9*  ALKPHOS 74 101  BILITOT 0.9 0.5  PROT 5.8* 6.2*  ALBUMIN 1.9* 1.8*   No results for input(s): LIPASE, AMYLASE in the last 168 hours. No results for input(s): AMMONIA in the last 168 hours. CBC:  Recent Labs Lab 12/11/15 0518 12/12/15 0645 12/13/15 0530 12/14/15 0515 12/15/15 0530  WBC 13.5* 13.5* 11.7* 10.8* 8.8  NEUTROABS 10.8*  --   --   --   --   HGB 9.0* 9.2* 8.3* 8.0* 8.5*  HCT 26.8* 28.1* 25.4* 24.8* 25.8*  MCV 85.6 87.0 85.8 87.0 86.3  PLT 309 343 PLATELET CLUMPS NOTED ON SMEAR, UNABLE TO ESTIMATE 344 PLATELET CLUMPS NOTED ON SMEAR, UNABLE TO ESTIMATE   Cardiac Enzymes:  Recent Labs Lab 12/15/15 0530 12/15/15 1400  TROPONINI <0.03 0.11*   BNP: Invalid input(s): POCBNP CBG:  Recent Labs Lab 12/14/15 2001 12/14/15 2349 12/15/15 0330 12/15/15 0732 12/15/15 1135  GLUCAP 115* 139* 133* 114* 133*    Recent Results (from the past 240 hour(s))  Culture, routine-abscess     Status: None   Collection Time: 12/08/15 12:41 PM  Result Value Ref Range Status   Specimen Description ABSCESS RLQ  Final   Special Requests NONE  Final   Gram Stain   Final    FEW WBC PRESENT,BOTH PMN AND MONONUCLEAR NO SQUAMOUS EPITHELIAL CELLS SEEN NO ORGANISMS SEEN Performed at [facility]    Culture   Final    NO GROWTH 2 DAYS Performed at [facility]    Report Status 12/10/2015 FINAL  Final  Anaerobic culture     Status: None   Collection Time: 12/08/15 12:41 PM  Result Value Ref Range Status   Specimen Description ABSCESS RLQ  Final   Special Requests NONE  Final   Gram Stain   Final    FEW WBC PRESENT,BOTH PMN AND MONONUCLEAR NO SQUAMOUS EPITHELIAL CELLS SEEN NO ORGANISMS SEEN Performed at [facility]    Culture   Final     NO ANAEROBES ISOLATED Performed at [facility]    Report Status 12/13/2015 FINAL  Final  Culture, routine-abscess     Status: None   Collection Time: 12/08/15 12:41 PM  Result Value Ref Range Status   Specimen Description ABSCESS PERIHEPATIC RUQ   Final   Special Requests NONE  Final   Gram Stain   Final    FEW WBC PRESENT, PREDOMINANTLY MONONUCLEAR NO SQUAMOUS EPITHELIAL CELLS SEEN NO ORGANISMS SEEN Performed at [facility]    Culture   Final    NO GROWTH 2 DAYS Performed at [facility]    Report Status 12/10/2015 FINAL  Final  Anaerobic culture     Status: None  Collection Time: 12/08/15 12:41 PM  Result Value Ref Range Status   Specimen Description ABSCESS PERIHEPATIC RUQ  Final   Special Requests NONE  Final   Gram Stain   Final    FEW WBC PRESENT, PREDOMINANTLY MONONUCLEAR NO SQUAMOUS EPITHELIAL CELLS SEEN NO ORGANISMS SEEN Performed at [facility]    Culture   Final    NO ANAEROBES ISOLATED Performed at [facility]    Report Status 12/13/2015 FINAL  Final  Culture, routine-abscess     Status: None   Collection Time: 12/08/15 12:41 PM  Result Value Ref Range Status   Specimen Description ABSCESS LUQ  Final   Special Requests NONE  Final   Gram Stain   Final    ABUNDANT WBC PRESENT,BOTH PMN AND MONONUCLEAR NO SQUAMOUS EPITHELIAL CELLS SEEN NO ORGANISMS SEEN Performed at [facility]    Culture   Final    NO GROWTH 2 DAYS Performed at [facility]    Report Status 12/10/2015 FINAL  Final  Anaerobic culture     Status: None   Collection Time: 12/08/15 12:41 PM  Result Value Ref Range Status   Specimen Description ABSCESS LUQ  Final   Special Requests NONE  Final   Gram Stain   Final    ABUNDANT WBC PRESENT,BOTH PMN AND MONONUCLEAR NO SQUAMOUS EPITHELIAL CELLS SEEN NO ORGANISMS SEEN Performed at [facility]    Culture   Final    NO ANAEROBES ISOLATED Performed at [facility]    Report Status 12/13/2015 FINAL  Final     Scheduled Meds: . albuterol  2.5 mg Nebulization BID  . antiseptic oral rinse  7 mL Mouth Rinse q12n4p  . bacitracin   Topical Daily  . chlorhexidine  15 mL Mouth Rinse BID  . collagenase  1 application Topical Daily  . fentaNYL (SUBLIMAZE) injection  50 mcg Intravenous Once  . insulin aspart  0-15 Units Subcutaneous 6 times per day  . lip balm  1 application Topical BID  . metroNIDAZOLE  500 mg Oral Q6H  . sodium chloride  3 mL Intravenous Q12H  . thiamine  100 mg Oral Daily   Continuous Infusions: . Marland KitchenTPN (CLINIMIX-E) Adult 65 mL/hr at 12/14/15 1800   And  . fat emulsion 240 mL (12/14/15 1801)  . Marland KitchenTPN (CLINIMIX-E) Adult     And  . fat emulsion    . heparin 2,500 Units/hr (12/15/15 0319)  . lactated ringers 80 mL/hr at 12/15/15 1639

## 2015-12-15 NOTE — Progress Notes (Signed)
Protocol: Heparin Indication: DVT  Assessment: Pt is currently on Heparin drip @ 2500 units/hr. HL=0.64 (goal 0.3-0.5) which is above goal. RN reported minimal bleeding from abdominal wound when dressing checked at shift change.  Plan: Decrease heparin drip to 2400 units/hr. Will check  6hr HL and CBC.   Garnet Sierras, PharmD 12/15/2015

## 2015-12-15 NOTE — Progress Notes (Signed)
See IR procedure note from yesterday. Could not reposition RUQ drain yesterday and ultimately removed existing drain. Pt is back in CT today for placement of new RUQ drain. Chart, meds, labs reviewed. Stopped heparin at ~0840, has been NPO.   Medications:  Current facility-administered medications:  .  acetaminophen (TYLENOL) suppository 650 mg, 650 mg, Rectal, Q6H PRN, Michael Boston, MD, 650 mg at 12/13/15 0425 .  albuterol (PROVENTIL) (2.5 MG/3ML) 0.083% nebulizer solution 2.5 mg, 2.5 mg, Inhalation, Q6H PRN, Florencia Reasons, MD .  albuterol (PROVENTIL) (2.5 MG/3ML) 0.083% nebulizer solution 2.5 mg, 2.5 mg, Nebulization, BID, Velvet Bathe, MD .  alum & mag hydroxide-simeth (MAALOX/MYLANTA) 200-200-20 MG/5ML suspension 30 mL, 30 mL, Oral, Q6H PRN, Michael Boston, MD, 30 mL at 12/13/15 0312 .  antiseptic oral rinse (CPC / CETYLPYRIDINIUM CHLORIDE 0.05%) solution 7 mL, 7 mL, Mouth Rinse, q12n4p, Kara Mead V, MD, 7 mL at 12/13/15 1131 .  bacitracin ointment, , Topical, Daily, Florencia Reasons, MD .  bisacodyl (DULCOLAX) suppository 10 mg, 10 mg, Rectal, Q12H PRN, Michael Boston, MD .  chlorhexidine (PERIDEX) 0.12 % solution 15 mL, 15 mL, Mouth Rinse, BID, Kara Mead V, MD, 15 mL at 12/15/15 1003 .  collagenase (SANTYL) ointment 1 application, 1 application, Topical, Daily, Florencia Reasons, MD, 1 application at [id number] .  diphenhydrAMINE (BENADRYL) injection 12.5-25 mg, 12.5-25 mg, Intravenous, Q6H PRN, Michael Boston, MD, 25 mg at 12/10/15 0845 .  TPN (CLINIMIX-E) Adult, , Intravenous, Continuous TPN, Last Rate: 65 mL/hr at 12/14/15 1800 **AND** fat emulsion 20 % infusion 240 mL, 240 mL, Intravenous, Continuous TPN, Angela Adam, RPH, Last Rate: 10 mL/hr at 12/14/15 1801, 240 mL at 12/14/15 1801 .  TPN (CLINIMIX-E) Adult, , Intravenous, Continuous TPN **AND** fat emulsion 20 % infusion 240 mL, 240 mL, Intravenous, Continuous TPN, Randall K Absher, RPH .  fentaNYL (SUBLIMAZE) 100 MCG/2ML injection, , , ,  .   fentaNYL (SUBLIMAZE) injection 50 mcg, 50 mcg, Intravenous, Once, Jacqulynn Cadet, MD, 50 mcg at 12/14/15 1805 .  heparin ADULT infusion 100 units/mL (25000 units/250 mL), 2,500 Units/hr, Intravenous, Continuous, Dorrene German, [facility], Last Rate: 25 mL/hr at 12/15/15 0319, 2,500 Units/hr at 12/15/15 0319 .  hydrALAZINE (APRESOLINE) injection 10-20 mg, 10-20 mg, Intravenous, Q6H PRN, Donita Brooks, NP, 20 mg at 12/04/15 1823 .  HYDROmorphone (DILAUDID) injection 2-3 mg, 2-3 mg, Intravenous, Q2H PRN, Robbie Lis, MD, 2 mg at 12/15/15 1003 .  insulin aspart (novoLOG) injection 0-15 Units, 0-15 Units, Subcutaneous, 6 times per day, Chesley Mires, MD, 2 Units at 12/15/15 [phone] .  lactated ringers infusion, , Intravenous, Continuous, Minda Ditto, RPH, Last Rate: 80 mL/hr at 12/15/15 0330 .  lip balm (CARMEX) ointment 1 application, 1 application, Topical, BID, Michael Boston, MD, 1 application at [id number] .  magic mouthwash, 15 mL, Oral, QID PRN, Michael Boston, MD, 15 mL at 12/12/15 1111 .  menthol-cetylpyridinium (CEPACOL) lozenge 3 mg, 1 lozenge, Oral, PRN, Michael Boston, MD .  metroNIDAZOLE (FLAGYL) tablet 500 mg, 500 mg, Oral, Q6H, Emina Riebock, NP, 500 mg at 12/15/15 0801 .  midazolam (VERSED) 2 MG/2ML injection, , , ,  .  ondansetron (ZOFRAN) injection 4 mg, 4 mg, Intravenous, Q6H PRN, Dianne Dun, NP, 4 mg at 12/11/15 0228 .  oxyCODONE (Oxy IR/ROXICODONE) immediate release tablet 10 mg, 10 mg, Oral, Q4H PRN, Robbie Lis, MD, 10 mg at 12/15/15 0324 .  phenol (CHLORASEPTIC) mouth spray 2 spray, 2 spray, Mouth/Throat, PRN, Michael Boston,  MD .  sodium chloride (OCEAN) 0.65 % nasal spray 1 spray, 1 spray, Each Nare, PRN, Robbie Lis, MD .  sodium chloride 0.9 % injection 10-40 mL, 10-40 mL, Intracatheter, PRN, Florencia Reasons, MD, 10 mL at 12/01/15 0315 .  sodium chloride 0.9 % injection 10-40 mL, 10-40 mL, Intracatheter, PRN, Robbie Lis, MD, 10 mL at 12/11/15 1148 .  sodium chloride 0.9  % injection 3 mL, 3 mL, Intravenous, Q12H, Reubin Milan, MD, 3 mL at 12/14/15 2204 .  thiamine (VITAMIN B-1) tablet 100 mg, 100 mg, Oral, Daily, 100 mg at 12/15/15 1003 **OR** [DISCONTINUED] thiamine (B-1) injection 100 mg, 100 mg, Intravenous, Daily, Michael Boston, MD, 100 mg at 12/08/15 0908 .  triamcinolone cream (KENALOG) 0.1 % 1 application, 1 application, Topical, BID PRN, Florencia Reasons, MD      Vital Signs: BP 155/82 mmHg  Pulse 80  Temp(Src) 99.6 F (37.6 C) (Oral)  Resp 18  Ht 6\' 2"  (1.88 m)  Wt 157 lb 10.1 oz (71.5 kg)  BMI 20.23 kg/m2  SpO2 93%  Physical Exam Lungs: CTA Heart: regular Abd: mildly distended LUQ drain intact. Midline wound with dressing.  Mallampati Score:  MD Evaluation Airway: WNL Heart: WNL Abdomen: WNL Chest/ Lungs: WNL ASA  Classification: 3 Mallampati/Airway Score: One   Signed: Ascencion Dike 12/15/2015, 10:26 AM

## 2015-12-15 NOTE — Progress Notes (Signed)
[facility] progress note. Chief complaint. Chest pain. History of present illness. This 65 year old male in hospital with small bowel obstruction and perforated viscus. Does have a known history of coronary artery disease. This morning he complained of central chest pain to the nursing staff. This rates radiation to the left neck/jaw and left arm. A 12-lead EKG was obtained and this indicated normal sinus rhythm with occasional PVC. No indication of acute ischemia seen. Patient was given Dilaudid by nursing and the patient reports significant improvement in pain complaints. Physical exam. Vital signs. Temperature 99.6, pulse 80, respiration 18, blood pressure 155/82. O2 sats 93%. General appearance. Well-developed male who is alert and in no distress. Cardiac. Rate and rhythm regular. Lungs. Breath sounds clear. Abdomen. Soft with hypotonic bowel sounds. Impression/plan. Problem #1. Chest pain. No indication of acute ischemia per 12-lead EKG. I will obtain troponins now and then every 6 hours for a total of 3 sets. We'll also repeat a 12-lead EKG in approximately 6 hours to evaluate for any changes.

## 2015-12-15 NOTE — Progress Notes (Signed)
PARENTERAL NUTRITION CONSULT NOTE - FOLLOW-UP  Pharmacy Consult for TPN Indication: Bowel obstruction, post op ileus  Allergies  Allergen Reactions  . Zetia [Ezetimibe] Anaphylaxis and Swelling    Tongue and throat   . Atorvastatin Other (See Comments)    Malaise & muscle weakness  . Penicillins Other (See Comments)    Unknown.  Tolerates cefepime  . Pravastatin Sodium     Pravastatin 40 mg qday and 40 mg q M/W/F caused muscle aches  . Lisinopril Rash  . Promethazine Rash   Patient Measurements: Height: 6\' 2"  (188 cm) Weight: 157 lb 10.1 oz (71.5 kg) IBW/kg (Calculated) : 82.2 Usual Weight: 137-140 lbs  Vital Signs: Temp: 99.6 F (37.6 C) (01/20 0336) Temp Source: Oral (01/20 0336) BP: 155/82 mmHg (01/20 0336) Pulse Rate: 80 (01/20 0336) Intake/Output from previous day: 01/19 0701 - 01/20 0700 In: 2468 [I.V.:1228; IV Piggyback:300; TPN:900] Out: 3225 [Urine:3000; Drains:225] Intake/Output from this shift:   Labs:  Recent Labs  12/13/15 0530 12/14/15 0515 12/15/15 0530  WBC 11.7* 10.8* 8.8  HGB 8.3* 8.0* 8.5*  HCT 25.4* 24.8* 25.8*  PLT PLATELET CLUMPS NOTED ON SMEAR, UNABLE TO ESTIMATE 344 PLATELET CLUMPS NOTED ON SMEAR, UNABLE TO ESTIMATE    Recent Labs  12/13/15 0530 12/14/15 0515 12/15/15 0530  NA 135 137 135  K 4.4 4.1 4.1  CL 101 103 99*  CO2 25 28 28   GLUCOSE 119* 96 104*  BUN 30* 26* 22*  CREATININE 1.19 1.04 0.92  CALCIUM 8.1* 8.3* 8.1*  MG 1.5* 1.5* 1.4*  PHOS 3.3 3.1 3.1  PROT  --  6.2*  --   ALBUMIN  --  1.8*  --   AST  --  21  --   ALT  --  9*  --   ALKPHOS  --  101  --   BILITOT  --  0.5  --   Corr Ca 9.7 Estimated Creatinine Clearance: 83.1 mL/min (by C-G formula based on Cr of 0.92).    Recent Labs  12/14/15 2001 12/14/15 2349 12/15/15 0330  GLUCAP 115* 139* 133*   Medical History: Past Medical History  Diagnosis Date  . Hypertension   . CAD (coronary artery disease)   . PVD (peripheral vascular disease) ([place])   .  MI (myocardial infarction) ([place])   . HLD (hyperlipidemia)    Medications:  Infusions:  . Marland KitchenTPN (CLINIMIX-E) Adult 65 mL/hr at 12/14/15 1800   And  . fat emulsion 240 mL (12/14/15 1801)  . heparin 2,500 Units/hr (12/15/15 0319)  . lactated ringers 80 mL/hr at 12/15/15 0330   Insulin Requirements in the past 24 hours: 6 units SSI/24h +  10 units Regular Insulin in TPN/24h  Current Nutrition:  Full liquid diet Clinimix-E 5/15 at 65 mL/hr Fat Emulsion 20% at 10 mL/hr  IVF: LR 80 ml/hr  Central access: PICC placed 1/3 TPN start date: 1/3  ASSESSMENT  HPI: 35 yoM presented to ED on 12/27 with abdominal pain.  PMH includes HTN, HLD, PVD - s/p fem bypass, CAD s/p NSTEMI 2014, cardiomyopathy, DM, diabetic peripheral neuropathy, seizures, and chronic pain on chronic opioids.  Surgical history significant for open AAA repair.  CT shows partial SBO, likely adhesions.  Patient refused surgery, until 1/6 s/p repair of perforated visous.  Plan for conservative management with TPN, bowel rest.   Significant events:  1/1: TPN per pharmacy ordered - unable to start without central access.  1/3: PICC line placed; TPN started 1/5: Acute encephalopathy with fevers (102F) overnight- sepsis protocol initiated.  Broad-spectrum abx started.  1/6: Consented to laparotomy w/ findings of perforated viscus and peritonitis. Txfer to ICU   1/7:  AKI 1/10: NGT removed 1/11: started clear liquid diet 1/13 NPO again for CT guided drain placement for perihepatic fluid collection suspicious for abscess. FL diet post-procedure 1/16: per CCS, no bowel sounds noted today, patient needs to be walked as much as possible 1/18: started full liquid diet, tolerating small amounts. AKI resolved. 1/19: IR: RLQ and RUO drains removed, LUQ drain repositioned  Today 12/15/2015:   Glucose: at goal with insulin in TPN, Range  104-144,Hx DM on glipizide PTA.   Electrolytes - Mg still low, Cl just below LLN but acceptable; others WNL.  Renal -  SCr remains WNL. UOP 3L, 1.7 mL/kg/hr  I/O record appears incomplete for intake  Drain: output 225 mL  LFTs - all below ULN  TGs -  (Hx of HPL) 124 (1/2), 164 (1/9), 95 (1/16)  Prealbumin - 3.7 (1/2), < 2 (1/9), 2.7 (1/16)  Pt reports tolerating small amounts of FL diet  NUTRITIONAL GOALS                                                                                             RD recs: 1600-1800 Kcal/day, 65-80 g protein/day, > 2 L fluid/day  Clinimix E 5/15 at a goal rate of 65 ml/hr + 20% fat emulsion at 10 ml/hr to provide: 78 g/day protein, 1588 Kcal/day.  PLAN                                                                                                    Mag Sulfate 2 grams IV x 1  Continue Clinimix E 5/15 at goal rate of 65 ml/h  Continue with 10 units of insulin / 24 hr added to bag  Continue 20% fat emulsion at 10 ml/hr  Standard MVI, MTE in TPN bag.  Continue q6h CBGs with moderate correction insulin.  TPN lab panels on Mondays & Thursdays.   Follow for potential advancement in diet when appropriate  Bmet with Mag and Phos in am  Clayburn Pert, PharmD, BCPS Pager: [phone] 12/15/2015  7:49 AM

## 2015-12-15 NOTE — Progress Notes (Signed)
ANTICOAGULATION CONSULT NOTE - F/u Consult  PM NOTE  Pharmacy Consult for heparin Indication: DVT  Allergies  Allergen Reactions  . Zetia [Ezetimibe] Anaphylaxis and Swelling    Tongue and throat   . Atorvastatin Other (See Comments)    Malaise & muscle weakness  . Penicillins Other (See Comments)    Unknown.  Tolerates cefepime  . Pravastatin Sodium     Pravastatin 40 mg qday and 40 mg q M/W/F caused muscle aches  . Lisinopril Rash  . Promethazine Rash   Patient Measurements: Height: 6\' 2"  (188 cm) Weight: 157 lb 10.1 oz (71.5 kg) IBW/kg (Calculated) : 82.2 Heparin Dosing Weight: 72kg  Vital Signs: Temp: 99.2 F (37.3 C) (01/20 1229) Temp Source: Oral (01/20 1229) BP: 152/81 mmHg (01/20 1229) Pulse Rate: 77 (01/20 1229)  Labs:  Recent Labs  12/13/15 0530 12/14/15 0515 12/15/15 0530  HGB 8.3* 8.0* 8.5*  HCT 25.4* 24.8* 25.8*  PLT PLATELET CLUMPS NOTED ON SMEAR, UNABLE TO ESTIMATE 344 PLATELET CLUMPS NOTED ON SMEAR, UNABLE TO ESTIMATE  HEPARINUNFRC 0.53 0.67 0.43  CREATININE 1.19 1.04 0.92  TROPONINI  --   --  <0.03    Estimated Creatinine Clearance: 83.1 mL/min (by C-G formula based on Cr of 0.92).  Assessment: 12 YOM known to pharmacy for TPN management and antibiotic dosing.  He is s/p ex lap on 1/6 and found to have perforated viscous and peritonitis. RUE doppler 1/11 reveals DVT of RUE along PICC site. PICC removed 1/11 and Left IJ placed.   Significant events: 1/11 baseline INR = 1.89, aPTT = 45sec 1/13 heparin held in AM for CT guided drain abscess drainage x 4 areas of fluid collection.  Heparin infusion resumed at previous rate (1800 units/hr) this afternoon at 1400 per IR instruction. Heparin level therapeutic after resumption of Heparin (0.40) 1/20 Bloody wound drainage reported.  Goal heparin level adjusted.  Today, 12/15/2015   RN reported patient bled through bandages on abdominal wound earlier this AM.  MD was notified and has examined patient.   No further reports of bleeding since wound redressed.  Heparin level therapeutic this AM (0.43).  Hgb low but improved, Plts clumped on smear this AM but  WNL yesterday.  Heparin interrupted at 0840 for IR procedure (CT guided RUQ drain placement) and has subsequently been resumed at previous rate.  Given reports of bleeding, will attempt to maintain heparin level at 0.3-0.5   Goal of Therapy:  Heparin level 0.3 - 0.5 units/mL Monitor platelets by anticoagulation protocol: Yes   Plan:   Continue heparin infusion at current rate of 2500 units/hr  Recheck heparin level at 6:30 pm  Daily CBC and heparin level  Awaiting plan for long term anticoagulation  Clayburn Pert, PharmD, BCPS Pager: [phone] 12/15/2015  12:43 PM

## 2015-12-15 NOTE — Progress Notes (Signed)
ANTICOAGULATION CONSULT NOTE - F/u Consult  Pharmacy Consult for heparin Indication: DVT  Allergies  Allergen Reactions  . Zetia [Ezetimibe] Anaphylaxis and Swelling    Tongue and throat   . Atorvastatin Other (See Comments)    Malaise & muscle weakness  . Penicillins Other (See Comments)    Unknown.  Tolerates cefepime  . Pravastatin Sodium     Pravastatin 40 mg qday and 40 mg q M/W/F caused muscle aches  . Lisinopril Rash  . Promethazine Rash   Patient Measurements: Height: 6\' 2"  (188 cm) Weight: 157 lb 10.1 oz (71.5 kg) IBW/kg (Calculated) : 82.2 Heparin Dosing Weight: 72kg  Vital Signs: Temp: 99.6 F (37.6 C) (01/20 0336) Temp Source: Oral (01/20 0336) BP: 155/82 mmHg (01/20 0336) Pulse Rate: 80 (01/20 0336)  Labs:  Recent Labs  12/13/15 0530 12/14/15 0515 12/15/15 0530  HGB 8.3* 8.0* 8.5*  HCT 25.4* 24.8* 25.8*  PLT PLATELET CLUMPS NOTED ON SMEAR, UNABLE TO ESTIMATE 344 PLATELET CLUMPS NOTED ON SMEAR, UNABLE TO ESTIMATE  HEPARINUNFRC 0.53 0.67 0.43  CREATININE 1.19 1.04 0.92  TROPONINI  --   --  <0.03    Estimated Creatinine Clearance: 83.1 mL/min (by C-G formula based on Cr of 0.92).  Assessment: 36 YOM known to pharmacy for TPN management and antibiotic dosing.  He is s/p ex lap on 1/6 and found to have perforated viscous and peritonitis. RUE doppler 1/11 reveals DVT of RUE along PICC site. PICC removed 1/11 and Left IJ placed.   Significant events: 1/11 baseline INR = 1.89, aPTT = 45sec 1/13 heparin held in AM for CT guided drain abscess drainage x 4 areas of fluid collection.  Heparin infusion resumed at previous rate (1800 units/hr) this afternoon at 1400 per IR instruction. Heparin level therapeutic after resumption of Heparin (0.40)  Today, 12/15/2015   Heparin level continues to be therapeutic on current rate of 2500 units/hr  Hgb low but improved, Plts clumped on smear this AM but  WNL yesterday.  No bleeding reported/documented.  Goal of  Therapy:  Heparin level 0.3-0.7 units/ml Monitor platelets by anticoagulation protocol: Yes   Plan:   Continue heparin infusion at current rate of 2500 units/hr  Daily CBC and heparin level  Awaiting plan for long term anticoagulation  Clayburn Pert, PharmD, BCPS Pager: [phone] 12/15/2015  7:53 AM

## 2015-12-16 DIAGNOSIS — L0291 Cutaneous abscess, unspecified: Secondary | ICD-10-CM

## 2015-12-16 DIAGNOSIS — I248 Other forms of acute ischemic heart disease: Secondary | ICD-10-CM | POA: Diagnosis not present

## 2015-12-16 DIAGNOSIS — R188 Other ascites: Secondary | ICD-10-CM | POA: Diagnosis present

## 2015-12-16 DIAGNOSIS — G9340 Encephalopathy, unspecified: Secondary | ICD-10-CM

## 2015-12-16 LAB — GLUCOSE, CAPILLARY
GLUCOSE-CAPILLARY: 102 mg/dL — AB (ref 65–99)
GLUCOSE-CAPILLARY: 129 mg/dL — AB (ref 65–99)
GLUCOSE-CAPILLARY: 83 mg/dL (ref 65–99)
Glucose-Capillary: 123 mg/dL — ABNORMAL HIGH (ref 65–99)
Glucose-Capillary: 115 mg/dL — ABNORMAL HIGH (ref 65–99)
Glucose-Capillary: 169 mg/dL — ABNORMAL HIGH (ref 65–99)

## 2015-12-16 LAB — CBC
HCT: 23.4 % — ABNORMAL LOW (ref 39.0–52.0)
HEMOGLOBIN: 7.7 g/dL — AB (ref 13.0–17.0)
MCH: 28.3 pg (ref 26.0–34.0)
MCHC: 32.9 g/dL (ref 30.0–36.0)
MCV: 86 fL (ref 78.0–100.0)
PLATELETS: 305 10*3/uL (ref 150–400)
RBC: 2.72 MIL/uL — AB (ref 4.22–5.81)
RDW: 15.4 % (ref 11.5–15.5)
WBC: 8.2 10*3/uL (ref 4.0–10.5)

## 2015-12-16 LAB — PHOSPHORUS: Phosphorus: 3.4 mg/dL (ref 2.5–4.6)

## 2015-12-16 LAB — HEPARIN LEVEL (UNFRACTIONATED)
HEPARIN UNFRACTIONATED: 0.35 [IU]/mL (ref 0.30–0.70)
Heparin Unfractionated: 0.25 IU/mL — ABNORMAL LOW (ref 0.30–0.70)

## 2015-12-16 LAB — MAGNESIUM
MAGNESIUM: 1.4 mg/dL — AB (ref 1.7–2.4)
Magnesium: 1.5 mg/dL — ABNORMAL LOW (ref 1.7–2.4)

## 2015-12-16 MED ORDER — KETOROLAC TROMETHAMINE 15 MG/ML IJ SOLN
15.0000 mg | Freq: Four times a day (QID) | INTRAMUSCULAR | Status: DC | PRN
  Filled 2015-12-16: qty 1

## 2015-12-16 MED ORDER — MAGNESIUM SULFATE 2 GM/50ML IV SOLN
2.0000 g | Freq: Once | INTRAVENOUS | Status: AC
  Administered 2015-12-16: 2 g via INTRAVENOUS
  Filled 2015-12-16: qty 50

## 2015-12-16 MED ORDER — INSULIN ASPART 100 UNIT/ML ~~LOC~~ SOLN
0.0000 [IU] | Freq: Three times a day (TID) | SUBCUTANEOUS | Status: DC
  Administered 2015-12-17 (×3): 3 [IU] via SUBCUTANEOUS
  Administered 2015-12-18: 5 [IU] via SUBCUTANEOUS

## 2015-12-16 MED ORDER — TRACE MINERALS CR-CU-MN-SE-ZN 10-1000-500-60 MCG/ML IV SOLN
INTRAVENOUS | Status: AC
  Administered 2015-12-16: 17:00:00 via INTRAVENOUS
  Filled 2015-12-16: qty 1560

## 2015-12-16 MED ORDER — FAT EMULSION 20 % IV EMUL
240.0000 mL | INTRAVENOUS | Status: AC
Start: 2015-12-16 — End: 2015-12-17
  Administered 2015-12-16: 240 mL via INTRAVENOUS
  Filled 2015-12-16: qty 250

## 2015-12-16 NOTE — Progress Notes (Addendum)
Patient ID: [name] [name], male   DOB: [birth year], 65 y.o.   MRN: [id number] [name] PROGRESS NOTE  PAMELA JANUSZ [id number] DOB: 1951-04-26 DOA: 11/21/2015 PCP: Marijean Bravo, MD  Brief narrative:    65 year old very pleasant male with a past medical history of hyperlipidemia, hypertension, CAD, type 2 diabetes, diabetic peripheral neuropathy, seizure disorder, polysubstance abuse and chronic pain issues (on high doses of oxycodone prior to admission) who was admitted 11/22/15 with a small bowel obstruction. Surgical consultation was obtained on admission with conservative therapy initially recommended. Patient's bowel obstruction was felt to be from adhesions and possible narcotic bowel. His NG tube was removed 11/23/15 and he seemed to be improving clinically but pain returned 11/24/15 with diet advancement to clears. Repeat films showed worsening small bowel dilatation so his NG tube was reinserted. TPN was subsequently ordered. On 11/27/15, the patient refused PICC line insertion for TPN and surgical intervention. PICC finally placed 11/28/15 with initiation of TPN.   Further, patient has developed a right upper extremity DVT and currently is on IV heparin.  On 11/30/15, the patient's condition deteriorated acutely and he was found to be confused and restless. Rapid response was called. He was febrile and tachycardic with an elevated lactate level. Due to concerns for sepsis, broad-spectrum antibiotics were initiated with vancomycin and Primaxin which was switched to cefepime given his seizure history. Patient uderwent exploratory laparotomy on 12/01/15 with findings of perforated viscus with peritoneal contamination/peritonitis. He remained intubated postoperatively and was under the care of the critical care team. He self extubated 12/02/15. Surgery continues to follow, patient has an open abdominal wound. He continues to have an ileus and is on TNA at this time. [name]  assumed care 12/05/15.  Transferred to telemetry floor 12/08/2015.  Barrier to discharge: Slow improvement, ongoing pain, on TNA. Unable to predict when he will be ready for discharge but possible early next week. Chest pain overnight 1/20 with slight bump in troponin but also noted drop in Hg as well from 8.5 --> 7.7 so suspect demand ischemia from blood loss.   Assessment/Plan:    Principal problem: SBO w/ perforated viscous and resultant peritonitis / leukocytosis / sepsis / post op ileus  - S/p abdominal fluid collection drainages 1/13 with removal of RLQ drain 1/20, repositioning of LUQ drain 1/20 and placement of new right subphrenic drain 1/20 - check recent fluid cx's; cont drain irrigation; monitor output closely and rescan once minimal - other plans as per CCS/IM  Active problems:  Chest pain 1/20 secondary to demand ischemia - mild elevation in troponin overnight - no chest pain this AM but if occurs again, will cycle CE's - check 12 lead EKG with chest pain episodes  - keep electrolytes stable  Hypomagnesemia - supplement and repeat Mg level in AM  Severe Protein calorie malnutrition / underweight - In the context of acute on chronic illness - Diet advancement per Gen surgery - Continue nutritional support with TPN  Acute upper extremity DVT - US DVT right distal axillary vein, subclavian vein.  - Continue heparin drip - No reports of bleeding - continue to monitor CBC - transition to oral AC once pt able to tolerate PO   Post operative respiratory failure with hypoxemia - Patient required ventilatory support after exploratory laparotomy 12/01/2015.  - Patient self extubated 12/02/2015 - Respiratory status has improved with Lasix 80 mg IV given 12/05/2015 - Respiratory status continues to be stable off oxygen via [place]  Acute Encephalopathy  in setting of sepsis / history of seizure disorder - EEG done 11/30/2015 showed no acute seizures - Load with Keppra if  seizures occur but so far no seizures at all  Acute kidney injury - In the setting of sepsis and small bowel obstruction with perforated viscus - Creatinine normalized with hydration  Anemia of chronic disease / thrombocytopenia - Related to history of substance and alcohol use  - Hemoglobin trending down, pt wants to hold off on transfusion for now - repeat CBC in AM and if it continue to drop, will discuss again with pt   Controlled, diabetes mellitus type 2 with peripheral circulatory complications without long-term insulin use - will continue to monitor CBG - Continue sliding scale insulin for now   Left lower extremity wound / S/p left fem pop bypass w/ non-healing ulcer - WOC assessment done   DVT Prophylaxis  - SCD's bilaterally in hospital   Code Status: Full.  Family Communication:  plan of care discussed with the patient, family not at the bedside this am Disposition Plan: unable to predict when pt can be discharged, slowly improving   IV access:  Central line   Procedures and diagnostic studies from 12/05/2015:  Ct Abdomen Pelvis Wo Contrast 12/07/2015   1. There is small bilateral pleural effusion right greater than left. Significant atelectasis or infiltrate in right lower lobe posteriorly. There is superimposed mild interstitial prominence bilateral lower lobes probable mild interstitial edema. 2. There is moderate perihepatic and perisplenic ascites. Moderate ascites noted bilateral paracolic gutters. Please note there is some loculation of perihepatic ascites with mild mass effect on the liver contour please see axial images 32 and 15. Peritoneal inflammation or infection cannot be excluded. 3. No hydronephrosis or hydroureter.  Stable bilateral renal cysts. 4. Postsurgical changes are noted anterior abdominal wall. Incomplete healed/open subcutaneous midline anterior abdominal wound. 5. Mild distended small bowel loops containing oral contrast material and some air-fluid  levels. There is no transition point in caliber of small bowel. Findings most likely due to significant ileus or residual partial small bowel obstruction. There is some oral contrast material noted within distal colon. 6. Moderate pelvic ascites. Moderate gas noted within mid sigmoid colon probable mild ileus. 7. Significant anasarca infiltration of subcutaneous fat abdominal and pelvic wall. 8. 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 Electronically Signed   By: Lahoma Crocker M.D.   On: 12/07/2015 15:18   Dg Chest Port 1 View 12/06/2015  Stable bilateral pulmonary edema, with right basilar subsegmental atelectasis and associated pleural effusion. Interval placement of left internal jugular catheter with distal tip in SVC. No pneumothorax is noted. Electronically Signed   By: Marijo Conception, M.D.   On: 12/06/2015 16:10   Dg Chest Port 1 View  12/05/2015   Worsening of pulmonary interstitial infiltrates bilaterally. This may reflect pulmonary edema of cardiac or noncardiac cause or could reflect pneumonia. There are small bilateral pleural effusions. Electronically Signed   By: David  Martinique M.D.   On: 12/05/2015 08:52   Exploratory laparotomy on 12/01/15   Medical Consultants:  Surgery  CCM transfer care to [facility] 1-10 PCT 12/08/2015 IR  Other Consultants:  Nutrition PT  IAnti-Infectives:   Flagyl 11-30-2015 --> Cefepime 12-02-2015 --> 12/10/2015   Faye Ramsay, MD  [name] Pager [phone]  Time spent in minutes: 25 minutes  If 7PM-7AM, please contact night-coverage [email or web address] Password [email or web address] 12/16/2015, 4:31 PM   LOS: 25 days    HPI/Subjective: No new complaints reported  today  Objective: Filed Vitals:   12/15/15 1301 12/15/15 1339 12/15/15 2036 12/16/15 0411  BP: 138/85 146/79 148/88 140/85  Pulse: 80 81 79 82  Temp: 98.2 F (36.8 C) 98 F (36.7 C) 98.8 F (37.1 C) 97.8 F (36.6 C)  TempSrc: Oral Oral Oral Oral  Resp: 15 18 18 18   Height:       Weight:      SpO2: 93% 93% 94% 93%    Intake/Output Summary (Last 24 hours) at 12/16/15 1631 Last data filed at 12/16/15 1400  Gross per 24 hour  Intake 2234.5 ml  Output   3605 ml  Net -1370.5 ml    Exam:   General:  Pt is alert, awake and in nad  Cardiovascular: Rate controlled, (+) S1, S2 wnl  Respiratory:  No wheezing, no rhonchi  Abdomen: Has 2 JP drains, no rebound tenderness, appreciate bowel sounds  Extremities: No edema right lower extremity, has left unaboot (+)  Neuro:  No focal deficits   Data Reviewed: Basic Metabolic Panel:  Recent Labs Lab 12/11/15 0518 12/12/15 0645 12/13/15 0530 12/14/15 0515 12/15/15 0530 12/16/15 0339 12/16/15 0800  NA 138 135 135 137 135  --   --   K 3.7 3.8 4.4 4.1 4.1  --   --   CL 104 101 101 103 99*  --   --   CO2 27 27 25 28 28   --   --   GLUCOSE 141* 82 119* 96 104*  --   --   BUN 38* 36* 30* 26* 22*  --   --   CREATININE 1.17 1.28* 1.19 1.04 0.92  --   --   CALCIUM 8.2* 8.1* 8.1* 8.3* 8.1*  --   --   MG 1.5* 1.8 1.5* 1.5* 1.4* 1.5* 1.4*  PHOS 3.2 3.6 3.3 3.1 3.1 3.4  --    Liver Function Tests:  Recent Labs Lab 12/11/15 0518 12/14/15 0515  AST 21 21  ALT 11* 9*  ALKPHOS 74 101  BILITOT 0.9 0.5  PROT 5.8* 6.2*  ALBUMIN 1.9* 1.8*   CBC:  Recent Labs Lab 12/11/15 0518 12/12/15 0645 12/13/15 0530 12/14/15 0515 12/15/15 0530 12/16/15 0339  WBC 13.5* 13.5* 11.7* 10.8* 8.8 8.2  NEUTROABS 10.8*  --   --   --   --   --   HGB 9.0* 9.2* 8.3* 8.0* 8.5* 7.7*  HCT 26.8* 28.1* 25.4* 24.8* 25.8* 23.4*  MCV 85.6 87.0 85.8 87.0 86.3 86.0  PLT 309 343 PLATELET CLUMPS NOTED ON SMEAR, UNABLE TO ESTIMATE 344 PLATELET CLUMPS NOTED ON SMEAR, UNABLE TO ESTIMATE 305   Cardiac Enzymes:  Recent Labs Lab 12/15/15 0530 12/15/15 1400 12/15/15 1948  TROPONINI <0.03 0.11* 0.13*   CBG:  Recent Labs Lab 12/15/15 2030 12/16/15 0035 12/16/15 0357 12/16/15 0749 12/16/15 1210  GLUCAP 102* 102* 123* 129* 83     Recent Results (from the past 240 hour(s))  Culture, routine-abscess     Status: None   Collection Time: 12/08/15 12:41 PM  Result Value Ref Range Status   Specimen Description ABSCESS RLQ  Final   Special Requests NONE  Final   Gram Stain   Final    FEW WBC PRESENT,BOTH PMN AND MONONUCLEAR NO SQUAMOUS EPITHELIAL CELLS SEEN NO ORGANISMS SEEN Performed at [facility]    Culture   Final    NO GROWTH 2 DAYS Performed at [facility]    Report Status 12/10/2015 FINAL  Final  Anaerobic culture  Status: None   Collection Time: 12/08/15 12:41 PM  Result Value Ref Range Status   Specimen Description ABSCESS RLQ  Final   Special Requests NONE  Final   Gram Stain   Final    FEW WBC PRESENT,BOTH PMN AND MONONUCLEAR NO SQUAMOUS EPITHELIAL CELLS SEEN NO ORGANISMS SEEN Performed at [facility]    Culture   Final    NO ANAEROBES ISOLATED Performed at [facility]    Report Status 12/13/2015 FINAL  Final  Culture, routine-abscess     Status: None   Collection Time: 12/08/15 12:41 PM  Result Value Ref Range Status   Specimen Description ABSCESS PERIHEPATIC RUQ   Final   Special Requests NONE  Final   Gram Stain   Final    FEW WBC PRESENT, PREDOMINANTLY MONONUCLEAR NO SQUAMOUS EPITHELIAL CELLS SEEN NO ORGANISMS SEEN Performed at [facility]    Culture   Final    NO GROWTH 2 DAYS Performed at [facility]    Report Status 12/10/2015 FINAL  Final  Anaerobic culture     Status: None   Collection Time: 12/08/15 12:41 PM  Result Value Ref Range Status   Specimen Description ABSCESS PERIHEPATIC RUQ  Final   Special Requests NONE  Final   Gram Stain   Final    FEW WBC PRESENT, PREDOMINANTLY MONONUCLEAR NO SQUAMOUS EPITHELIAL CELLS SEEN NO ORGANISMS SEEN Performed at [facility]    Culture   Final    NO ANAEROBES ISOLATED Performed at [facility]    Report Status 12/13/2015 FINAL  Final  Culture,  routine-abscess     Status: None   Collection Time: 12/08/15 12:41 PM  Result Value Ref Range Status   Specimen Description ABSCESS LUQ  Final   Special Requests NONE  Final   Gram Stain   Final    ABUNDANT WBC PRESENT,BOTH PMN AND MONONUCLEAR NO SQUAMOUS EPITHELIAL CELLS SEEN NO ORGANISMS SEEN Performed at [facility]    Culture   Final    NO GROWTH 2 DAYS Performed at [facility]    Report Status 12/10/2015 FINAL  Final  Anaerobic culture     Status: None   Collection Time: 12/08/15 12:41 PM  Result Value Ref Range Status   Specimen Description ABSCESS LUQ  Final   Special Requests NONE  Final   Gram Stain   Final    ABUNDANT WBC PRESENT,BOTH PMN AND MONONUCLEAR NO SQUAMOUS EPITHELIAL CELLS SEEN NO ORGANISMS SEEN Performed at [facility]    Culture   Final    NO ANAEROBES ISOLATED Performed at [facility]    Report Status 12/13/2015 FINAL  Final  Culture, routine-abscess     Status: None (Preliminary result)   Collection Time: 12/15/15 11:12 AM  Result Value Ref Range Status   Specimen Description ABSCESS CT RUQ DRAIN  Final   Special Requests Normal  Final   Gram Stain   Final    ABUNDANT WBC PRESENT,BOTH PMN AND MONONUCLEAR NO SQUAMOUS EPITHELIAL CELLS SEEN NO ORGANISMS SEEN Performed at [facility]    Culture PENDING  Incomplete   Report Status PENDING  Incomplete  Culture, routine-abscess     Status: None (Preliminary result)   Collection Time: 12/15/15  1:48 PM  Result Value Ref Range Status   Specimen Description JP DRAINAGE  Final   Special Requests NONE  Final   Gram Stain   Final    ABUNDANT WBC PRESENT,BOTH PMN  AND MONONUCLEAR NO SQUAMOUS EPITHELIAL CELLS SEEN NO ORGANISMS SEEN Performed at [facility]    Culture PENDING  Incomplete   Report Status PENDING  Incomplete    Scheduled Meds: . fentaNYL injection  50 mcg Intravenous Once  . insulin aspart  0-15 Units Subcutaneous TID WC  .  metroNIDAZOLE  500 mg Oral Q6H  . thiamine  100 mg Oral Daily   Continuous Infusions: . Marland KitchenTPN (CLINIMIX-E) Adult 65 mL/hr at 12/15/15 1712   And  . fat emulsion 240 mL (12/15/15 1713)  . Marland KitchenTPN (CLINIMIX-E) Adult     And  . fat emulsion    . heparin 2,400 Units/hr (12/16/15 1628)  . lactated ringers 80 mL/hr at 12/16/15 1628

## 2015-12-16 NOTE — Progress Notes (Addendum)
ANTICOAGULATION CONSULT NOTE - follow up  Pharmacy Consult for heparin Indication: DVT  Allergies  Allergen Reactions  . Zetia [Ezetimibe] Anaphylaxis and Swelling    Tongue and throat   . Atorvastatin Other (See Comments)    Malaise & muscle weakness  . Penicillins Other (See Comments)    Unknown.  Tolerates cefepime  . Pravastatin Sodium     Pravastatin 40 mg qday and 40 mg q M/W/F caused muscle aches  . Lisinopril Rash  . Promethazine Rash   Patient Measurements: Height: 6\' 2"  (188 cm) Weight: 157 lb 10.1 oz (71.5 kg) IBW/kg (Calculated) : 82.2 Heparin Dosing Weight: 72kg  Vital Signs: Temp: 97.8 F (36.6 C) (01/21 0411) Temp Source: Oral (01/21 0411) BP: 140/85 mmHg (01/21 0411) Pulse Rate: 82 (01/21 0411)  Labs:  Recent Labs  12/14/15 0515 12/15/15 0530 12/15/15 1400 12/15/15 1948 12/16/15 0339 12/16/15 0824  HGB 8.0* 8.5*  --   --  7.7*  --   HCT 24.8* 25.8*  --   --  23.4*  --   PLT 344 PLATELET CLUMPS NOTED ON SMEAR, UNABLE TO ESTIMATE  --   --  305  --   HEPARINUNFRC 0.67 0.43  --  0.64 0.25* 0.35  CREATININE 1.04 0.92  --   --   --   --   TROPONINI  --  <0.03 0.11* 0.13*  --   --     Estimated Creatinine Clearance: 83.1 mL/min (by C-G formula based on Cr of 0.92).  Assessment: 54 YOM known to pharmacy for TPN management and antibiotic dosing.  He is s/p ex lap on 1/6 and found to have perforated viscous and peritonitis. RUE doppler 1/11 reveals DVT of RUE along PICC site. PICC removed 1/11 and Left IJ placed.   Significant events: 1/11 baseline INR = 1.89, aPTT = 45sec 1/13 heparin held in AM for CT guided drain abscess drainage x 4 areas of fluid collection.  Heparin infusion resumed at previous rate (1800 units/hr) this afternoon at 1400 per IR instruction. Heparin level therapeutic after resumption of Heparin (0.40) 1/20 Bloody wound drainage reported.  Goal heparin level adjusted.  Today, 12/16/2015   RN reported patient bled through bandages  on abdominal wound on 1/20 and today 1/21 bleeding through bandages continues.   Heparin level therapeutic this AM (0.35) on current rate of 2400 units/hr  Hgb low and dropping some, Plts stable.  Given reports of bleeding, will attempt to maintain heparin level at 0.3-0.5   Goal of Therapy:  Heparin level 0.3 - 0.5 units/mL Monitor platelets by anticoagulation protocol: Yes   Plan:   Continue heparin infusion at current rate of 2400 units/hr  Recheck heparin level in AM  Daily CBC and heparin level  Awaiting plan for long term anticoagulation  Monitor bleeding on abdominal wound closely   Adrian Saran, PharmD, BCPS Pager [phone] 12/16/2015 11:05 AM

## 2015-12-16 NOTE — Progress Notes (Signed)
Patient ID: [name] [name], male   DOB: Nov 15, 1951, 65 y.o.   MRN: [id number]    Referring Physician(s): CCS  Chief Complaint:  Abdominal fluid collections  Subjective:  Pt still with abd soreness,swollen rt arm; denies N/V; no sig appetite  Allergies: Zetia; Atorvastatin; Penicillins; Pravastatin sodium; Lisinopril; and Promethazine  Medications: Prior to Admission medications   Medication Sig Start Date End Date Taking? Authorizing Provider  albuterol (PROVENTIL HFA;VENTOLIN HFA) 108 (90 BASE) MCG/ACT inhaler Inhale 2 puffs into the lungs every 6 (six) hours as needed for wheezing or shortness of breath. 11/11/13  Yes Ripudeep Krystal Eaton, MD  aspirin EC 81 MG tablet Take 1 tablet (81 mg total) by mouth daily. 11/06/15  Yes Sherren Mocha, MD  collagenase (SANTYL) ointment Apply thin layer to wound bed of left foot daily Patient taking differently: Apply 1 application topically daily. Apply thin layer to wound bed of left foot daily 10/26/15  Yes Rosetta Posner, MD  gabapentin (NEURONTIN) 300 MG capsule Take 300 mg by mouth 3 (three) times daily. 09/30/15  Yes Historical Provider, MD  glipiZIDE (GLUCOTROL) 10 MG tablet Take 10 mg by mouth 2 (two) times daily before a meal.   Yes Historical Provider, MD  ketoconazole (NIZORAL) 2 % cream Apply 1 application topically daily as needed for irritation.   Yes Historical Provider, MD  Multiple Vitamins-Minerals (MULTIVITAMIN WITH MINERALS) tablet Take 4 tablets by mouth 3 (three) times daily.   Yes Historical Provider, MD  nitroGLYCERIN (NITROSTAT) 0.4 MG SL tablet Place 0.4 mg under the tongue every 5 (five) minutes as needed for chest pain.   Yes Historical Provider, MD  oxyCODONE (ROXICODONE) 15 MG immediate release tablet Take 3 tablets (45 mg total) by mouth every 6 (six) hours as needed (severe pain). 07/30/15  Yes Kimberly A Trinh, PA-C  PATADAY 0.2 % SOLN Place 1 drop into both eyes daily.  08/11/14  Yes Historical Provider, MD  polyethylene  glycol (MIRALAX / GLYCOLAX) packet Take 17 g by mouth daily as needed for mild constipation or moderate constipation.    Yes Historical Provider, MD  triamcinolone cream (KENALOG) 0.1 % Apply 1 application topically 2 (two) times daily as needed (for eczema).   Yes Historical Provider, MD  zolpidem (AMBIEN) 10 MG tablet Take 10 mg by mouth at bedtime as needed for sleep.   Yes Historical Provider, MD  silver sulfADIAZINE (SILVADENE) 1 % cream Apply 1 application topically daily. Patient not taking: Reported on 11/14/2015 07/25/15   Rosetta Posner, MD     Vital Signs: BP 140/85 mmHg  Pulse 82  Temp(Src) 97.8 F (36.6 C) (Oral)  Resp 18  Ht 6\' 2"  (1.88 m)  Wt 157 lb 10.1 oz (71.5 kg)  BMI 20.23 kg/m2  SpO2 93%  Physical Exam RUQ/LUQ drains intact, insertion sites ok, outputs 95/70 cc respectively turbid, beige colored fluid; new fluid cx's pend  Imaging: Ct Abdomen Pelvis Wo Contrast  12/14/2015  CLINICAL DATA:  Status post laparoscopic surgery and repair of small bowel perforation. EXAM: CT ABDOMEN AND PELVIS WITHOUT CONTRAST TECHNIQUE: Multidetector CT imaging of the abdomen and pelvis was performed following the standard protocol without IV contrast. COMPARISON:  CT scan of December 07, 2015. FINDINGS: Severe degenerative disc disease is noted at L5-S1. Mild left pleural effusion is noted with adjacent subsegmental atelectasis. Moderate right pleural effusion is noted with associated atelectasis or pneumonia of the right middle and lower lobes. No gallstones are noted. No focal abnormality is noted in  the liver, spleen or pancreas on these unenhanced images. Adrenal glands are unremarkable. Stable large right renal cyst is noted. No hydronephrosis or renal obstruction is noted. Status post aortobifemoral bypass graft placement for treatment of abdominal aortic aneurysm. There is been interval placement of percutaneous drainage into perihepatic fluid collection. This fluid collection is slightly  smaller compared to prior exam. There has also been interval placement of percutaneous drainage catheter into left upper quadrant fluid collection which is significantly smaller compared to prior exam. There is also been interval placement of percutaneous drainage catheter in right lateral peritoneal fluid collection, which is also significantly smaller compared to prior exam. Large amount of contrast is seen throughout the colon which is nondilated. Diffuse wall and fold thickening is noted throughout multiple loops of small bowel suggesting inflammation or enteritis. No significant small bowel dilatation is noted. Urinary bladder appears normal. Moderate anasarca is again noted. Minimal to mild residual ascites is noted. IMPRESSION: Bilateral pleural effusions are noted, with right greater than left. There appears to be some degree of atelectasis or pneumonia involving the right lower and middle lobes. Status post interval placement of 3 percutaneous drainage catheters. There is 1 in the perihepatic fluid collection, which is slightly smaller compared to prior exam. Another is noted in left upper quadrant fluid collection which is significantly smaller compared to prior exam. A third is noted in right lateral peritoneal fluid collection, which is also significantly smaller and nearly resolved compared to prior exam. Large amount of contrast is seen throughout the colon which is nondilated. Diffuse wall and fold thickening is seen throughout multiple loops small bowel suggesting inflammation or enteritis, but no significant bowel dilatation is noted Electronically Signed   By: Marijo Conception, M.D.   On: 12/14/2015 14:09   Dg Chest 2 View  12/14/2015  CLINICAL DATA:  65 year old male with postoperative respiratory failure, hypoxemia and sepsis. EXAM: CHEST  2 VIEW COMPARISON:  12/06/2015 chest radiograph.  12/14/2015 abdominal CT. FINDINGS: The cardiomediastinal silhouette is unchanged. A left IJ central venous  catheter with tip overlying the lower SVC again noted. Moderate bilateral pleural effusions have increased in size, right greater than left. Increasing bilateral lower lung atelectasis/ consolidation noted. There is no evidence of pneumothorax. Pulmonary vascular congestion is present. IMPRESSION: Enlarging moderate bilateral pleural effusions, right greater than left, with increasing bilateral lower lung atelectasis/ consolidation. Pulmonary vascular congestion. Electronically Signed   By: Margarette Canada M.D.   On: 12/14/2015 19:11   Ir Sinus/fist Tube Chk-non Gi  12/15/2015  CLINICAL DATA:  65 yo male with a history of perforated small bowel and acute abdomen status post exploratory laparotomy, washout and small bowel repair. Rib his postoperative course has been complicated by development of multiple intra-abdominal abscesses. Percutaneous drainage was performed 12/08/2015 with placement of 3 drainage catheters. CT imaging earlier today demonstrates that the right lower quadrant drainage catheter has successfully drained at the intended fluid collection. This drain can be removed. Both of the upper quadrant drainage catheters need to be repositioned into the subdiaphragmatic spaces to drain persistent fluid in those regions. EXAM: EXAM SINUS TRACT INJECTION/FISTULOGRAM Date: 12/15/2015 PROCEDURE: PROCEDURE 1. Removal of right lower quadrant drain 2. Attempted repositioning of right upper quadrant drain with ultimate removal of drainage catheter 3. Successful repositioning and up size of left upper quadrant drain Interventional Radiologist: Criselda Peaches, MD ANESTHESIA/SEDATION: ANESTHESIA/SEDATION 50 mcg fentanyl administered during the course of the procedure MEDICATIONS: MEDICATIONS None additional FLUOROSCOPY TIME:  11 minutes 30  seconds for a total of 167 mGy CONTRAST:  67mL OMNIPAQUE IOHEXOL 300 MG/ML SOLN TECHNIQUE: TECHNIQUE Informed consent was obtained from the patient following explanation of the  procedure, risks, benefits and alternatives. The patient understands, agrees and consents for the procedure. All questions were addressed. A time out was performed. Maximal barrier sterile technique utilized including caps, mask, sterile gowns, sterile gloves, large sterile drape, hand hygiene, and Betadine skin prep. The right lower quadrant drainage catheter was cut and removed. Contrast was injected through the right upper quadrant drainage catheter. This confirms that there is no residual abscess cavity at the drainage a loop of the catheter. However, shortly beyond the entry point of the catheter into the abdominal wall there is a communication with a larger fluid collection in the subdiaphragmatic space. This catheter was cut and removed over a Bentson wire. A 5 French catheter was navigated over the wire. Multiple attempts with several wires were made in an effort to reposition the wire into the subdiaphragmatic space. This was ultimately unsuccessful. The decision was made to pursue replacement of the right upper quadrant drainage catheter under CT guidance. Attention was turned to the left upper quadrant drainage catheter. Contrast was injected, again, shortly beyond the entry point into the left upper quadrant there is a communication with the larger subdiaphragmatic fluid collection. The fluid collection around the draining loop of the catheter has resolved. This catheter was cut and removed over a Bentson wire. Using a 5 French angled catheter and glidewire, the eat wire was successfully navigated into did subdiaphragmatic space. The catheter was advanced into the space in the glidewire exchanged for an Amplatz wire. A Cook 12 [place] all-purpose drainage catheter was advanced over the wire and formed in the subdiaphragmatic space. There was immediate return of approximately 150 cc of opaque tan fluid and debris. The catheter was secured in place with 0 Prolene suture and connected to JP bulb drainage.  Bandages were applied. The patient tolerated the procedure well. COMPLICATIONS: COMPLICATIONS None IMPRESSION: IMPRESSION 1. The right lower quadrant drain was removed. 2. Unsuccessful attempted repositioning of the right upper quadrant drainage catheter. The catheter could not be manipulated up into the subdiaphragmatic space and was therefore removed. This catheter will be replaced under CT guidance tomorrow 12/14/14. 3. Successful repositioning of the left upper quadrant drainage catheter into the subdiaphragmatic collection with return of copious opaque tan colored fluid and debris. Electronically Signed   By: Jacqulynn Cadet M.D.   On: 12/15/2015 17:08   Ir Sinus/fist Tube Chk-non Gi  12/15/2015  CLINICAL DATA:  65 yo male with a history of perforated small bowel and acute abdomen status post exploratory laparotomy, washout and small bowel repair. Rib his postoperative course has been complicated by development of multiple intra-abdominal abscesses. Percutaneous drainage was performed 12/08/2015 with placement of 3 drainage catheters. CT imaging earlier today demonstrates that the right lower quadrant drainage catheter has successfully drained at the intended fluid collection. This drain can be removed. Both of the upper quadrant drainage catheters need to be repositioned into the subdiaphragmatic spaces to drain persistent fluid in those regions. EXAM: SINUS TRACT INJECTION/FISTULOGRAM Date: 12/15/2015 PROCEDURE: 1. Removal of right lower quadrant drain 2. Attempted repositioning of right upper quadrant drain with ultimate removal of drainage catheter 3. Successful repositioning and up size of left upper quadrant drain Interventional Radiologist:  Criselda Peaches, MD ANESTHESIA/SEDATION: 50 mcg fentanyl administered during the course of the procedure MEDICATIONS: None additional FLUOROSCOPY TIME:  11 minutes 30  seconds for a total of 167 mGy CONTRAST:  64mL OMNIPAQUE IOHEXOL 300 MG/ML  SOLN TECHNIQUE:  Informed consent was obtained from the patient following explanation of the procedure, risks, benefits and alternatives. The patient understands, agrees and consents for the procedure. All questions were addressed. A time out was performed. Maximal barrier sterile technique utilized including caps, mask, sterile gowns, sterile gloves, large sterile drape, hand hygiene, and Betadine skin prep. The right lower quadrant drainage catheter was cut and removed. Contrast was injected through the right upper quadrant drainage catheter. This confirms that there is no residual abscess cavity at the drainage a loop of the catheter. However, shortly beyond the entry point of the catheter into the abdominal wall there is a communication with a larger fluid collection in the subdiaphragmatic space. This catheter was cut and removed over a Bentson wire. A 5 French catheter was navigated over the wire. Multiple attempts with several wires were made in an effort to reposition the wire into the subdiaphragmatic space. This was ultimately unsuccessful. The decision was made to pursue replacement of the right upper quadrant drainage catheter under CT guidance. Attention was turned to the left upper quadrant drainage catheter. Contrast was injected, again, shortly beyond the entry point into the left upper quadrant there is a communication with the larger subdiaphragmatic fluid collection. The fluid collection around the draining loop of the catheter has resolved. This catheter was cut and removed over a Bentson wire. Using a 5 French angled catheter and glidewire, the eat wire was successfully navigated into did subdiaphragmatic space. The catheter was advanced into the space in the glidewire exchanged for an Amplatz wire. A Cook 12 [place] all-purpose drainage catheter was advanced over the wire and formed in the subdiaphragmatic space. There was immediate return of approximately 150 cc of opaque tan fluid and debris. The catheter was  secured in place with 0 Prolene suture and connected to JP bulb drainage. Bandages were applied. The patient tolerated the procedure well. COMPLICATIONS: None IMPRESSION: 1. The right lower quadrant drain was removed. 2. Unsuccessful attempted repositioning of the right upper quadrant drainage catheter. The catheter could not be manipulated up into the subdiaphragmatic space and was therefore removed. This catheter will be replaced under CT guidance tomorrow 12/14/14. 3. Successful repositioning of the left upper quadrant drainage catheter into the subdiaphragmatic collection with return of copious opaque tan colored fluid and debris. Signed, Criselda Peaches, MD [facility] Radiology Electronically Signed   By: Jacqulynn Cadet M.D.   On: 12/15/2015 07:49   Ir Catheter Tube Change  12/15/2015  CLINICAL DATA:  65 yo male with a history of perforated small bowel and acute abdomen status post exploratory laparotomy, washout and small bowel repair. Rib his postoperative course has been complicated by development of multiple intra-abdominal abscesses. Percutaneous drainage was performed 12/08/2015 with placement of 3 drainage catheters. CT imaging earlier today demonstrates that the right lower quadrant drainage catheter has successfully drained at the intended fluid collection. This drain can be removed. Both of the upper quadrant drainage catheters need to be repositioned into the subdiaphragmatic spaces to drain persistent fluid in those regions. EXAM: EXAM SINUS TRACT INJECTION/FISTULOGRAM Date: 12/15/2015 PROCEDURE: PROCEDURE 1. Removal of right lower quadrant drain 2. Attempted repositioning of right upper quadrant drain with ultimate removal of drainage catheter 3. Successful repositioning and up size of left upper quadrant drain Interventional Radiologist: Criselda Peaches, MD ANESTHESIA/SEDATION: ANESTHESIA/SEDATION 50 mcg fentanyl administered during the  course  of the procedure MEDICATIONS: MEDICATIONS None additional FLUOROSCOPY TIME:  11 minutes 30 seconds for a total of 167 mGy CONTRAST:  52mL OMNIPAQUE IOHEXOL 300 MG/ML SOLN TECHNIQUE: TECHNIQUE Informed consent was obtained from the patient following explanation of the procedure, risks, benefits and alternatives. The patient understands, agrees and consents for the procedure. All questions were addressed. A time out was performed. Maximal barrier sterile technique utilized including caps, mask, sterile gowns, sterile gloves, large sterile drape, hand hygiene, and Betadine skin prep. The right lower quadrant drainage catheter was cut and removed. Contrast was injected through the right upper quadrant drainage catheter. This confirms that there is no residual abscess cavity at the drainage a loop of the catheter. However, shortly beyond the entry point of the catheter into the abdominal wall there is a communication with a larger fluid collection in the subdiaphragmatic space. This catheter was cut and removed over a Bentson wire. A 5 French catheter was navigated over the wire. Multiple attempts with several wires were made in an effort to reposition the wire into the subdiaphragmatic space. This was ultimately unsuccessful. The decision was made to pursue replacement of the right upper quadrant drainage catheter under CT guidance. Attention was turned to the left upper quadrant drainage catheter. Contrast was injected, again, shortly beyond the entry point into the left upper quadrant there is a communication with the larger subdiaphragmatic fluid collection. The fluid collection around the draining loop of the catheter has resolved. This catheter was cut and removed over a Bentson wire. Using a 5 French angled catheter and glidewire, the eat wire was successfully navigated into did subdiaphragmatic space. The catheter was advanced into the space in the glidewire exchanged for an Amplatz wire. A Cook 12 [place]  all-purpose drainage catheter was advanced over the wire and formed in the subdiaphragmatic space. There was immediate return of approximately 150 cc of opaque tan fluid and debris. The catheter was secured in place with 0 Prolene suture and connected to JP bulb drainage. Bandages were applied. The patient tolerated the procedure well. COMPLICATIONS: COMPLICATIONS None IMPRESSION: IMPRESSION 1. The right lower quadrant drain was removed. 2. Unsuccessful attempted repositioning of the right upper quadrant drainage catheter. The catheter could not be manipulated up into the subdiaphragmatic space and was therefore removed. This catheter will be replaced under CT guidance tomorrow 12/14/14. 3. Successful repositioning of the left upper quadrant drainage catheter into the subdiaphragmatic collection with return of copious opaque tan colored fluid and debris. Electronically Signed   By: Jacqulynn Cadet M.D.   On: 12/15/2015 17:08   Ct Image Guided Fluid Drain By Catheter  12/15/2015  CLINICAL DATA:  Small bowel perforation with multiple post-operative peritoneal fluid collections and increasing leukocytosis. Multiple percutaneous drainage catheters have previously been placed. There is a large undrained right subphrenic collection. EXAM: CT GUIDED DRAINAGE OF RIGHT SUBPHRENIC ABSCESS ANESTHESIA/SEDATION: Intravenous Fentanyl and Versed were administered as conscious sedation during continuous monitoring of the patient's level of consciousness and physiological / cardiorespiratory status by the radiology RN, with a total moderate sedation time of 10 minutes. PROCEDURE: The procedure, risks, benefits, and alternatives were explained to the patient. Questions regarding the procedure were encouraged and answered. The patient understands and consents to the procedure. Select axial scans through the upper abdomen were obtained. Patient was placed in left posterior oblique position an additional scans obtained. The right  subphrenic collection was localized and an appropriate skin entry site was identified and marked. The operative field was prepped with  Betadinein a sterile fashion, and a sterile drape was applied covering the operative field. A sterile gown and sterile gloves were used for the procedure. Local anesthesia was provided with 1% Lidocaine. Under CT fluoroscopic guidance, a 19 gauge percutaneous entry needle was advanced into the inferior aspect of the collection. Thin somewhat cloudy pale yellow fluid could be aspirated. A guidewire advanced easily within the collection, its position confirmed on CT fluoro. Tract was dilated to facilitate placement of a 12 French pigtail catheter, formed centrally in the subphrenic component of the collection. 10 mL of opaque yellowish thin fluid were aspirated sent for Gram stain, culture and sensitivity. Catheter secured externally with 0 Prolene suture and StatLock and placed to gravity drain bag. The patient tolerated the procedure well. COMPLICATIONS: None immediate FINDINGS: Limited CT demonstrates some interval increase in bilateral pleural effusions since prior scan of 12/14/2015. Center venous catheter to the distal SVC. Patchy coronary calcifications. There is a complex gas and fluid collection extending from anterior to the right hepatic lobe into the right subhepatic space. There is no contains some contrast material since yesterday's catheter injection. The left upper quadrant drain catheter has been redirected into the subphrenic collection which has significantly resolved since the previous day's exam. Percutaneous 12 French pigtail drain catheter placed into the right subphrenic collection as detailed above. IMPRESSION: 1. Technically successful CT-guided right subphrenic abscess drain catheter placement. Electronically Signed   By: Lucrezia Europe M.D.   On: 12/15/2015 14:57    Labs:  CBC:  Recent Labs  12/13/15 0530 12/14/15 0515 12/15/15 0530 12/16/15 0339    WBC 11.7* 10.8* 8.8 8.2  HGB 8.3* 8.0* 8.5* 7.7*  HCT 25.4* 24.8* 25.8* 23.4*  PLT PLATELET CLUMPS NOTED ON SMEAR, UNABLE TO ESTIMATE 344 PLATELET CLUMPS NOTED ON SMEAR, UNABLE TO ESTIMATE 305    COAGS:  Recent Labs  07/26/15 1415 12/06/15 0925 12/08/15 0340  INR 1.09 1.89* 1.82*  APTT 37 45*  --     BMP:  Recent Labs  12/12/15 0645 12/13/15 0530 12/14/15 0515 12/15/15 0530  NA 135 135 137 135  K 3.8 4.4 4.1 4.1  CL 101 101 103 99*  CO2 27 25 28 28   GLUCOSE 82 119* 96 104*  BUN 36* 30* 26* 22*  CALCIUM 8.1* 8.1* 8.3* 8.1*  CREATININE 1.28* 1.19 1.04 0.92  GFRNONAA 58* >60 >60 >60  GFRAA >60 >60 >60 >60    LIVER FUNCTION TESTS:  Recent Labs  12/04/15 0447 12/07/15 0445 12/11/15 0518 12/14/15 0515  BILITOT 0.5 0.9 0.9 0.5  AST 30 27 21 21   ALT 15* 14* 11* 9*  ALKPHOS 66 72 74 101  PROT 4.6* 5.3* 5.8* 6.2*  ALBUMIN 1.9* 1.8* 1.9* 1.8*    Assessment and Plan: S/p abdominal fluid collection drainages 1/13 with removal of RLQ drain 1/20, repositioning of LUQ drain 1/20 and placement of new right subphrenic drain 1/20; AF; WBC nl; hgb 7.7 (?transfuse); check recent fluid cx's; cont drain irrigation; monitor output closely and rescan once minimal; other plans as per CCS/IM  Electronically Signed: D. Rowe Robert 12/16/2015, 12:30 PM   I spent a total of 15 minutes at the the patient's bedside AND on the patient's hospital floor or unit, greater than 50% of which was counseling/coordinating care for abdominal fluid collection drains

## 2015-12-16 NOTE — Progress Notes (Signed)
PARENTERAL NUTRITION CONSULT NOTE - FOLLOW-UP  Pharmacy Consult for TPN Indication: Bowel obstruction, post op ileus  Allergies  Allergen Reactions  . Zetia [Ezetimibe] Anaphylaxis and Swelling    Tongue and throat   . Atorvastatin Other (See Comments)    Malaise & muscle weakness  . Penicillins Other (See Comments)    Unknown.  Tolerates cefepime  . Pravastatin Sodium     Pravastatin 40 mg qday and 40 mg q M/W/F caused muscle aches  . Lisinopril Rash  . Promethazine Rash   Patient Measurements: Height: 6\' 2"  (188 cm) Weight: 157 lb 10.1 oz (71.5 kg) IBW/kg (Calculated) : 82.2 Usual Weight: 137-140 lbs  Vital Signs: Temp: 97.8 F (36.6 C) (01/21 0411) Temp Source: Oral (01/21 0411) BP: 140/85 mmHg (01/21 0411) Pulse Rate: 82 (01/21 0411) Intake/Output from previous day: 01/20 0701 - 01/21 0700 In: 2404.5 [P.O.:180; I.V.:1220; TPN:984.5] Out: 1290 [Urine:1125; Drains:165] Intake/Output from this shift: Total I/O In: -  Out: 550 [Urine:550] Labs:  Recent Labs  12/14/15 0515 12/15/15 0530 12/16/15 0339  WBC 10.8* 8.8 8.2  HGB 8.0* 8.5* 7.7*  HCT 24.8* 25.8* 23.4*  PLT 344 PLATELET CLUMPS NOTED ON SMEAR, UNABLE TO ESTIMATE 305    Recent Labs  12/14/15 0515 12/15/15 0530 12/16/15 0339  NA 137 135  --   K 4.1 4.1  --   CL 103 99*  --   CO2 28 28  --   GLUCOSE 96 104*  --   BUN 26* 22*  --   CREATININE 1.04 0.92  --   CALCIUM 8.3* 8.1*  --   MG 1.5* 1.4* 1.5*  PHOS 3.1 3.1 3.4  PROT 6.2*  --   --   ALBUMIN 1.8*  --   --   AST 21  --   --   ALT 9*  --   --   ALKPHOS 101  --   --   BILITOT 0.5  --   --   Corr Ca 9.7 Estimated Creatinine Clearance: 83.1 mL/min (by C-G formula based on Cr of 0.92).    Recent Labs  12/16/15 0035 12/16/15 0357 12/16/15 0749  GLUCAP 102* 123* 129*   Medical History: Past Medical History  Diagnosis Date  . Hypertension   . CAD (coronary artery disease)   . PVD (peripheral vascular disease) ([place])   . MI  (myocardial infarction) ([place])   . HLD (hyperlipidemia)    Medications:  Infusions:  . Marland KitchenTPN (CLINIMIX-E) Adult 65 mL/hr at 12/15/15 1712   And  . fat emulsion 240 mL (12/15/15 1713)  . heparin 2,400 Units/hr (12/16/15 0403)  . lactated ringers 80 mL/hr at 12/15/15 1830   Insulin Requirements in the past 24 hours: 10 units SSI/24h +  10 units Regular Insulin in TPN/24h  Current Nutrition:  Full liquid diet - tolerated well and advanced to mechanical soft on 1/21 Clinimix-E 5/15 at 65 mL/hr Fat Emulsion 20% at 10 mL/hr  IVF: LR 80 ml/hr  Central access: PICC placed 1/3 TPN start date: 1/3  ASSESSMENT  HPI: 89 yoM presented to ED on 12/27 with abdominal pain.  PMH includes HTN, HLD, PVD - s/p fem bypass, CAD s/p NSTEMI 2014, cardiomyopathy, DM, diabetic peripheral neuropathy, seizures, and chronic pain on chronic opioids.  Surgical history significant for open AAA repair.  CT shows partial SBO, likely adhesions.  Patient refused surgery, until 1/6 s/p repair of perforated visous.  Plan for conservative management with TPN, bowel rest.   Significant events:  1/1: TPN per pharmacy ordered - unable to start without central access.  1/3: PICC line placed; TPN started 1/5: Acute encephalopathy with fevers (102F) overnight- sepsis protocol initiated.  Broad-spectrum abx started.  1/6: Consented to laparotomy w/ findings of perforated viscus and peritonitis. Txfer to ICU   1/7:  AKI 1/10: NGT removed 1/11: started clear liquid diet 1/13 NPO again for CT guided drain placement for perihepatic fluid collection suspicious for abscess. FL diet post-procedure 1/16: per CCS, no bowel sounds noted today, patient needs to be walked as much as possible 1/18: started full liquid diet, tolerating small amounts. AKI resolved. 1/19: IR: RLQ and RUO drains removed, LUQ drain repositioned  Today  12/16/2015:   Glucose: at goal with insulin in TPN, Range 87-133, Hx DM on glipizide PTA.   Electrolytes - Mg still low - will replace.  Renal -  SCr remains WNL. UOP down, 0.7 mL/kg/hr  I/O record appears incomplete for intake  Drain: output 225 mL  LFTs - all below ULN  TGs -  (Hx of HPL) 124 (1/2), 164 (1/9), 95 (1/16)  Prealbumin - 3.7 (1/2), < 2 (1/9), 2.7 (1/16)  Pt reports tolerating small amounts of FL diet  NUTRITIONAL GOALS                                                                                             RD recs: 1600-1800 Kcal/day, 65-80 g protein/day, > 2 L fluid/day  Clinimix E 5/15 at a goal rate of 65 ml/hr + 20% fat emulsion at 10 ml/hr to provide: 78 g/day protein, 1588 Kcal/day.  PLAN                                                                                                Continue Clinimix E 5/15 at goal rate of 65 ml/h  Continue with 10 units of insulin / 24 hr added to bag  Continue 20% fat emulsion at 10 ml/hr  Standard MVI, MTE in TPN bag.  Change CBGs with moderate correction insulin to meal coverage only since CBGs have been stable for a number of days now  Repeat magnesium sulfate 2g IV x 1 today  TPN lab panels on Mondays & Thursdays.   Follow up toleration of mechanical soft diet started 1/21  and potentially start weaning TPN beginning tomorrow 1/22   Adrian Saran, PharmD, BCPS Pager [phone] 12/16/2015 11:13 AM

## 2015-12-16 NOTE — Progress Notes (Signed)
Protocol: Heparin Indication: DVT  Assessment:  [id number] HL=0.25, RN reported heparin drip was disconnected and bed was soak at midnight. (She came in at 2300)  No other problems reported  Goal = 0.3-0.5 (recent bleeding from abdominal wound)   Plan: Continue heparin drip at 2400 units/hr Will recheck HL at [place], Angeldejesus Callaham R 12/16/2015

## 2015-12-16 NOTE — Progress Notes (Addendum)
15 Days Post-Op  Subjective: Pt with no issues overnight. IR placed LUQ drain  Objective: Vital signs in last 24 hours: Temp:  [97.8 F (36.6 C)-99.2 F (37.3 C)] 97.8 F (36.6 C) (01/21 0411) Pulse Rate:  [77-87] 82 (01/21 0411) Resp:  [11-19] 18 (01/21 0411) BP: (133-152)/(74-91) 140/85 mmHg (01/21 0411) SpO2:  [91 %-99 %] 93 % (01/21 0411) Last BM Date: 12/15/15  Intake/Output from previous day: 01/20 0701 - 01/21 0700 In: 2404.5 [P.O.:180; I.V.:1220; TPN:984.5] Out: 1290 [Urine:1125; Drains:165] Intake/Output this shift: Total I/O In: -  Out: 550 [Urine:550]  General appearance: alert and cooperative Cardio: regular rate and rhythm, S1, S2 normal, no murmur, click, rub or gallop GI: soft, non-tender; bowel sounds normal; no masses,  no organomegaly and Drain with serous/purulent drainage  Lab Results:   Recent Labs  12/15/15 0530 12/16/15 0339  WBC 8.8 8.2  HGB 8.5* 7.7*  HCT 25.8* 23.4*  PLT PLATELET CLUMPS NOTED ON SMEAR, UNABLE TO ESTIMATE 305   BMET  Recent Labs  12/14/15 0515 12/15/15 0530  NA 137 135  K 4.1 4.1  CL 103 99*  CO2 28 28  GLUCOSE 96 104*  BUN 26* 22*  CREATININE 1.04 0.92  CALCIUM 8.3* 8.1*   PT/INR No results for input(s): LABPROT, INR in the last 72 hours. ABG No results for input(s): PHART, HCO3 in the last 72 hours.  Invalid input(s): PCO2, PO2  Studies/Results: Ct Abdomen Pelvis Wo Contrast  12/14/2015  CLINICAL DATA:  Status post laparoscopic surgery and repair of small bowel perforation. EXAM: CT ABDOMEN AND PELVIS WITHOUT CONTRAST TECHNIQUE: Multidetector CT imaging of the abdomen and pelvis was performed following the standard protocol without IV contrast. COMPARISON:  CT scan of December 07, 2015. FINDINGS: Severe degenerative disc disease is noted at L5-S1. Mild left pleural effusion is noted with adjacent subsegmental atelectasis. Moderate right pleural effusion is noted with associated atelectasis or pneumonia of the  right middle and lower lobes. No gallstones are noted. No focal abnormality is noted in the liver, spleen or pancreas on these unenhanced images. Adrenal glands are unremarkable. Stable large right renal cyst is noted. No hydronephrosis or renal obstruction is noted. Status post aortobifemoral bypass graft placement for treatment of abdominal aortic aneurysm. There is been interval placement of percutaneous drainage into perihepatic fluid collection. This fluid collection is slightly smaller compared to prior exam. There has also been interval placement of percutaneous drainage catheter into left upper quadrant fluid collection which is significantly smaller compared to prior exam. There is also been interval placement of percutaneous drainage catheter in right lateral peritoneal fluid collection, which is also significantly smaller compared to prior exam. Large amount of contrast is seen throughout the colon which is nondilated. Diffuse wall and fold thickening is noted throughout multiple loops of small bowel suggesting inflammation or enteritis. No significant small bowel dilatation is noted. Urinary bladder appears normal. Moderate anasarca is again noted. Minimal to mild residual ascites is noted. IMPRESSION: Bilateral pleural effusions are noted, with right greater than left. There appears to be some degree of atelectasis or pneumonia involving the right lower and middle lobes. Status post interval placement of 3 percutaneous drainage catheters. There is 1 in the perihepatic fluid collection, which is slightly smaller compared to prior exam. Another is noted in left upper quadrant fluid collection which is significantly smaller compared to prior exam. A third is noted in right lateral peritoneal fluid collection, which is also significantly smaller and nearly resolved compared to  prior exam. Large amount of contrast is seen throughout the colon which is nondilated. Diffuse wall and fold thickening is seen  throughout multiple loops small bowel suggesting inflammation or enteritis, but no significant bowel dilatation is noted Electronically Signed   By: Marijo Conception, M.D.   On: 12/14/2015 14:09   Dg Chest 2 View  12/14/2015  CLINICAL DATA:  66 year old male with postoperative respiratory failure, hypoxemia and sepsis. EXAM: CHEST  2 VIEW COMPARISON:  12/06/2015 chest radiograph.  12/14/2015 abdominal CT. FINDINGS: The cardiomediastinal silhouette is unchanged. A left IJ central venous catheter with tip overlying the lower SVC again noted. Moderate bilateral pleural effusions have increased in size, right greater than left. Increasing bilateral lower lung atelectasis/ consolidation noted. There is no evidence of pneumothorax. Pulmonary vascular congestion is present. IMPRESSION: Enlarging moderate bilateral pleural effusions, right greater than left, with increasing bilateral lower lung atelectasis/ consolidation. Pulmonary vascular congestion. Electronically Signed   By: Margarette Canada M.D.   On: 12/14/2015 19:11   Ir Sinus/fist Tube Chk-non Gi  12/15/2015  CLINICAL DATA:  65 yo male with a history of perforated small bowel and acute abdomen status post exploratory laparotomy, washout and small bowel repair. Rib his postoperative course has been complicated by development of multiple intra-abdominal abscesses. Percutaneous drainage was performed 12/08/2015 with placement of 3 drainage catheters. CT imaging earlier today demonstrates that the right lower quadrant drainage catheter has successfully drained at the intended fluid collection. This drain can be removed. Both of the upper quadrant drainage catheters need to be repositioned into the subdiaphragmatic spaces to drain persistent fluid in those regions. EXAM: EXAM SINUS TRACT INJECTION/FISTULOGRAM Date: 12/15/2015 PROCEDURE: PROCEDURE 1. Removal of right lower quadrant drain 2. Attempted repositioning of right upper quadrant drain with ultimate removal of  drainage catheter 3. Successful repositioning and up size of left upper quadrant drain Interventional Radiologist: Criselda Peaches, MD ANESTHESIA/SEDATION: ANESTHESIA/SEDATION 50 mcg fentanyl administered during the course of the procedure MEDICATIONS: MEDICATIONS None additional FLUOROSCOPY TIME:  11 minutes 30 seconds for a total of 167 mGy CONTRAST:  54mL OMNIPAQUE IOHEXOL 300 MG/ML SOLN TECHNIQUE: TECHNIQUE Informed consent was obtained from the patient following explanation of the procedure, risks, benefits and alternatives. The patient understands, agrees and consents for the procedure. All questions were addressed. A time out was performed. Maximal barrier sterile technique utilized including caps, mask, sterile gowns, sterile gloves, large sterile drape, hand hygiene, and Betadine skin prep. The right lower quadrant drainage catheter was cut and removed. Contrast was injected through the right upper quadrant drainage catheter. This confirms that there is no residual abscess cavity at the drainage a loop of the catheter. However, shortly beyond the entry point of the catheter into the abdominal wall there is a communication with a larger fluid collection in the subdiaphragmatic space. This catheter was cut and removed over a Bentson wire. A 5 French catheter was navigated over the wire. Multiple attempts with several wires were made in an effort to reposition the wire into the subdiaphragmatic space. This was ultimately unsuccessful. The decision was made to pursue replacement of the right upper quadrant drainage catheter under CT guidance. Attention was turned to the left upper quadrant drainage catheter. Contrast was injected, again, shortly beyond the entry point into the left upper quadrant there is a communication with the larger subdiaphragmatic fluid collection. The fluid collection around the draining loop of the catheter has resolved. This catheter was cut and removed over a Bentson wire. Using a  5 French angled catheter and glidewire, the eat wire was successfully navigated into did subdiaphragmatic space. The catheter was advanced into the space in the glidewire exchanged for an Amplatz wire. A Cook 12 [place] all-purpose drainage catheter was advanced over the wire and formed in the subdiaphragmatic space. There was immediate return of approximately 150 cc of opaque tan fluid and debris. The catheter was secured in place with 0 Prolene suture and connected to JP bulb drainage. Bandages were applied. The patient tolerated the procedure well. COMPLICATIONS: COMPLICATIONS None IMPRESSION: IMPRESSION 1. The right lower quadrant drain was removed. 2. Unsuccessful attempted repositioning of the right upper quadrant drainage catheter. The catheter could not be manipulated up into the subdiaphragmatic space and was therefore removed. This catheter will be replaced under CT guidance tomorrow 12/14/14. 3. Successful repositioning of the left upper quadrant drainage catheter into the subdiaphragmatic collection with return of copious opaque tan colored fluid and debris. Electronically Signed   By: Jacqulynn Cadet M.D.   On: 12/15/2015 17:08   Ir Sinus/fist Tube Chk-non Gi  12/15/2015  CLINICAL DATA:  65 yo male with a history of perforated small bowel and acute abdomen status post exploratory laparotomy, washout and small bowel repair. Rib his postoperative course has been complicated by development of multiple intra-abdominal abscesses. Percutaneous drainage was performed 12/08/2015 with placement of 3 drainage catheters. CT imaging earlier today demonstrates that the right lower quadrant drainage catheter has successfully drained at the intended fluid collection. This drain can be removed. Both of the upper quadrant drainage catheters need to be repositioned into the subdiaphragmatic spaces to drain persistent fluid in those regions. EXAM: SINUS TRACT INJECTION/FISTULOGRAM Date: 12/15/2015 PROCEDURE: 1. Removal  of right lower quadrant drain 2. Attempted repositioning of right upper quadrant drain with ultimate removal of drainage catheter 3. Successful repositioning and up size of left upper quadrant drain Interventional Radiologist:  Criselda Peaches, MD ANESTHESIA/SEDATION: 50 mcg fentanyl administered during the course of the procedure MEDICATIONS: None additional FLUOROSCOPY TIME:  11 minutes 30 seconds for a total of 167 mGy CONTRAST:  50mL OMNIPAQUE IOHEXOL 300 MG/ML  SOLN TECHNIQUE: Informed consent was obtained from the patient following explanation of the procedure, risks, benefits and alternatives. The patient understands, agrees and consents for the procedure. All questions were addressed. A time out was performed. Maximal barrier sterile technique utilized including caps, mask, sterile gowns, sterile gloves, large sterile drape, hand hygiene, and Betadine skin prep. The right lower quadrant drainage catheter was cut and removed. Contrast was injected through the right upper quadrant drainage catheter. This confirms that there is no residual abscess cavity at the drainage a loop of the catheter. However, shortly beyond the entry point of the catheter into the abdominal wall there is a communication with a larger fluid collection in the subdiaphragmatic space. This catheter was cut and removed over a Bentson wire. A 5 French catheter was navigated over the wire. Multiple attempts with several wires were made in an effort to reposition the wire into the subdiaphragmatic space. This was ultimately unsuccessful. The decision was made to pursue replacement of the right upper quadrant drainage catheter under CT guidance. Attention was turned to the left upper quadrant drainage catheter. Contrast was injected, again, shortly beyond the entry point into the left upper quadrant there is a communication with the larger subdiaphragmatic fluid collection. The fluid collection around the draining loop of the catheter has  resolved. This catheter was cut and removed over a Bentson wire. Using a  5 French angled catheter and glidewire, the eat wire was successfully navigated into did subdiaphragmatic space. The catheter was advanced into the space in the glidewire exchanged for an Amplatz wire. A Cook 12 [place] all-purpose drainage catheter was advanced over the wire and formed in the subdiaphragmatic space. There was immediate return of approximately 150 cc of opaque tan fluid and debris. The catheter was secured in place with 0 Prolene suture and connected to JP bulb drainage. Bandages were applied. The patient tolerated the procedure well. COMPLICATIONS: None IMPRESSION: 1. The right lower quadrant drain was removed. 2. Unsuccessful attempted repositioning of the right upper quadrant drainage catheter. The catheter could not be manipulated up into the subdiaphragmatic space and was therefore removed. This catheter will be replaced under CT guidance tomorrow 12/14/14. 3. Successful repositioning of the left upper quadrant drainage catheter into the subdiaphragmatic collection with return of copious opaque tan colored fluid and debris. Signed, Criselda Peaches, MD [facility] Electronically Signed   By: Jacqulynn Cadet M.D.   On: 12/15/2015 07:49   Ir Catheter Tube Change  12/15/2015  CLINICAL DATA:  65 yo male with a history of perforated small bowel and acute abdomen status post exploratory laparotomy, washout and small bowel repair. Rib his postoperative course has been complicated by development of multiple intra-abdominal abscesses. Percutaneous drainage was performed 12/08/2015 with placement of 3 drainage catheters. CT imaging earlier today demonstrates that the right lower quadrant drainage catheter has successfully drained at the intended fluid collection. This drain can be removed. Both of the upper quadrant drainage catheters need to be repositioned into the  subdiaphragmatic spaces to drain persistent fluid in those regions. EXAM: EXAM SINUS TRACT INJECTION/FISTULOGRAM Date: 12/15/2015 PROCEDURE: PROCEDURE 1. Removal of right lower quadrant drain 2. Attempted repositioning of right upper quadrant drain with ultimate removal of drainage catheter 3. Successful repositioning and up size of left upper quadrant drain Interventional Radiologist: Criselda Peaches, MD ANESTHESIA/SEDATION: ANESTHESIA/SEDATION 50 mcg fentanyl administered during the course of the procedure MEDICATIONS: MEDICATIONS None additional FLUOROSCOPY TIME:  11 minutes 30 seconds for a total of 167 mGy CONTRAST:  42mL OMNIPAQUE IOHEXOL 300 MG/ML SOLN TECHNIQUE: TECHNIQUE Informed consent was obtained from the patient following explanation of the procedure, risks, benefits and alternatives. The patient understands, agrees and consents for the procedure. All questions were addressed. A time out was performed. Maximal barrier sterile technique utilized including caps, mask, sterile gowns, sterile gloves, large sterile drape, hand hygiene, and Betadine skin prep. The right lower quadrant drainage catheter was cut and removed. Contrast was injected through the right upper quadrant drainage catheter. This confirms that there is no residual abscess cavity at the drainage a loop of the catheter. However, shortly beyond the entry point of the catheter into the abdominal wall there is a communication with a larger fluid collection in the subdiaphragmatic space. This catheter was cut and removed over a Bentson wire. A 5 French catheter was navigated over the wire. Multiple attempts with several wires were made in an effort to reposition the wire into the subdiaphragmatic space. This was ultimately unsuccessful. The decision was made to pursue replacement of the right upper quadrant drainage catheter under CT guidance. Attention was turned to the left upper quadrant drainage catheter. Contrast was injected, again,  shortly beyond the entry point into the left upper quadrant there is a communication with the larger subdiaphragmatic fluid collection. The fluid collection around the draining loop of the catheter has resolved.  This catheter was cut and removed over a Bentson wire. Using a 5 French angled catheter and glidewire, the eat wire was successfully navigated into did subdiaphragmatic space. The catheter was advanced into the space in the glidewire exchanged for an Amplatz wire. A Cook 12 [place] all-purpose drainage catheter was advanced over the wire and formed in the subdiaphragmatic space. There was immediate return of approximately 150 cc of opaque tan fluid and debris. The catheter was secured in place with 0 Prolene suture and connected to JP bulb drainage. Bandages were applied. The patient tolerated the procedure well. COMPLICATIONS: COMPLICATIONS None IMPRESSION: IMPRESSION 1. The right lower quadrant drain was removed. 2. Unsuccessful attempted repositioning of the right upper quadrant drainage catheter. The catheter could not be manipulated up into the subdiaphragmatic space and was therefore removed. This catheter will be replaced under CT guidance tomorrow 12/14/14. 3. Successful repositioning of the left upper quadrant drainage catheter into the subdiaphragmatic collection with return of copious opaque tan colored fluid and debris. Electronically Signed   By: Jacqulynn Cadet M.D.   On: 12/15/2015 17:08   Ct Image Guided Fluid Drain By Catheter  12/15/2015  CLINICAL DATA:  Small bowel perforation with multiple post-operative peritoneal fluid collections and increasing leukocytosis. Multiple percutaneous drainage catheters have previously been placed. There is a large undrained right subphrenic collection. EXAM: CT GUIDED DRAINAGE OF RIGHT SUBPHRENIC ABSCESS ANESTHESIA/SEDATION: Intravenous Fentanyl and Versed were administered as conscious sedation during continuous monitoring of the patient's level of  consciousness and physiological / cardiorespiratory status by the radiology RN, with a total moderate sedation time of 10 minutes. PROCEDURE: The procedure, risks, benefits, and alternatives were explained to the patient. Questions regarding the procedure were encouraged and answered. The patient understands and consents to the procedure. Select axial scans through the upper abdomen were obtained. Patient was placed in left posterior oblique position an additional scans obtained. The right subphrenic collection was localized and an appropriate skin entry site was identified and marked. The operative field was prepped with Betadinein a sterile fashion, and a sterile drape was applied covering the operative field. A sterile gown and sterile gloves were used for the procedure. Local anesthesia was provided with 1% Lidocaine. Under CT fluoroscopic guidance, a 19 gauge percutaneous entry needle was advanced into the inferior aspect of the collection. Thin somewhat cloudy pale yellow fluid could be aspirated. A guidewire advanced easily within the collection, its position confirmed on CT fluoro. Tract was dilated to facilitate placement of a 12 French pigtail catheter, formed centrally in the subphrenic component of the collection. 10 mL of opaque yellowish thin fluid were aspirated sent for Gram stain, culture and sensitivity. Catheter secured externally with 0 Prolene suture and StatLock and placed to gravity drain bag. The patient tolerated the procedure well. COMPLICATIONS: None immediate FINDINGS: Limited CT demonstrates some interval increase in bilateral pleural effusions since prior scan of 12/14/2015. Center venous catheter to the distal SVC. Patchy coronary calcifications. There is a complex gas and fluid collection extending from anterior to the right hepatic lobe into the right subhepatic space. There is no contains some contrast material since yesterday's catheter injection. The left upper quadrant drain  catheter has been redirected into the subphrenic collection which has significantly resolved since the previous day's exam. Percutaneous 12 French pigtail drain catheter placed into the right subphrenic collection as detailed above. IMPRESSION: 1. Technically successful CT-guided right subphrenic abscess drain catheter placement. Electronically Signed   By: Lucrezia Europe M.D.   On:  12/15/2015 14:57    Anti-infectives: Anti-infectives    Start     Dose/Rate Route Frequency Ordered Stop   12/15/15 0800  metroNIDAZOLE (FLAGYL) tablet 500 mg     500 mg Oral Every 6 hours 12/15/15 0714     12/08/15 0800  metroNIDAZOLE (FLAGYL) IVPB 500 mg  Status:  Discontinued     500 mg 100 mL/hr over 60 Minutes Intravenous Every 6 hours 12/08/15 0737 12/15/15 0714   12/03/15 1800  vancomycin (VANCOCIN) IVPB 750 mg/150 ml premix  Status:  Discontinued     750 mg 150 mL/hr over 60 Minutes Intravenous Every 24 hours 12/03/15 1715 12/04/15 0911   12/02/15 1800  ceFEPIme (MAXIPIME) 1 g in dextrose 5 % 50 mL IVPB     1 g 100 mL/hr over 30 Minutes Intravenous Every 12 hours 12/02/15 1014 12/09/15 1759   12/02/15 0600  vancomycin (VANCOCIN) IVPB 1000 mg/200 mL premix  Status:  Discontinued     1,000 mg 200 mL/hr over 60 Minutes Intravenous 3 times per day 12/02/15 0547 12/02/15 0954   12/01/15 2200  ceFEPIme (MAXIPIME) 1 g in dextrose 5 % 50 mL IVPB  Status:  Discontinued     1 g 100 mL/hr over 30 Minutes Intravenous 3 times per day 12/01/15 1455 12/02/15 1014   11/30/15 1400  ceFEPIme (MAXIPIME) 1 g in dextrose 5 % 50 mL IVPB  Status:  Discontinued     1 g 100 mL/hr over 30 Minutes Intravenous 3 times per day 11/30/15 0913 12/01/15 1455   11/30/15 1000  metroNIDAZOLE (FLAGYL) IVPB 500 mg     500 mg 100 mL/hr over 60 Minutes Intravenous Every 8 hours 11/30/15 0858 12/07/15 0226   11/30/15 0645  vancomycin (VANCOCIN) IVPB 750 mg/150 ml premix  Status:  Discontinued     750 mg 150 mL/hr over 60 Minutes Intravenous 3  times per day 11/30/15 0630 12/02/15 0547   11/30/15 0645  imipenem-cilastatin (PRIMAXIN) 500 mg in sodium chloride 0.9 % 100 mL IVPB  Status:  Discontinued     500 mg 200 mL/hr over 30 Minutes Intravenous 3 times per day 11/30/15 0630 11/30/15 0859      Assessment/Plan: POD#15 exlap, and repair of SB perforation in setting of SBO--Dr. Hassell Done 12/01/15 -pulmonary toilet, mobilize  -BID wet to dry dressing changes to open wound/retention sutures -Mobilze Post op intra-abdominal abscesses -RUQ drain placement -LUQ dran in place Post op ileus-resolved ID-on flagyl. only IR abscess culture NGTD RUE DVT-heparin gtt PCM-TPN until PO is adequate. Prealbumin is 2.7(1/16) FEN-adv to reg diet as tol Dispo-continue inpatient.   LOS: 25 days    Rosario Jacks., [facility] 12/16/2015

## 2015-12-17 LAB — PHOSPHORUS: Phosphorus: 3.3 mg/dL (ref 2.5–4.6)

## 2015-12-17 LAB — CBC
HEMATOCRIT: 22.8 % — AB (ref 39.0–52.0)
HEMOGLOBIN: 7.4 g/dL — AB (ref 13.0–17.0)
MCH: 27.4 pg (ref 26.0–34.0)
MCHC: 32.5 g/dL (ref 30.0–36.0)
MCV: 84.4 fL (ref 78.0–100.0)
Platelets: 297 10*3/uL (ref 150–400)
RBC: 2.7 MIL/uL — ABNORMAL LOW (ref 4.22–5.81)
RDW: 15.3 % (ref 11.5–15.5)
WBC: 7.1 10*3/uL (ref 4.0–10.5)

## 2015-12-17 LAB — GLUCOSE, CAPILLARY
GLUCOSE-CAPILLARY: 187 mg/dL — AB (ref 65–99)
GLUCOSE-CAPILLARY: 258 mg/dL — AB (ref 65–99)
Glucose-Capillary: 178 mg/dL — ABNORMAL HIGH (ref 65–99)
Glucose-Capillary: 161 mg/dL — ABNORMAL HIGH (ref 65–99)

## 2015-12-17 LAB — BASIC METABOLIC PANEL
Anion gap: 8 (ref 5–15)
BUN: 19 mg/dL (ref 6–20)
CALCIUM: 8.1 mg/dL — AB (ref 8.9–10.3)
CHLORIDE: 97 mmol/L — AB (ref 101–111)
CO2: 31 mmol/L (ref 22–32)
CREATININE: 0.9 mg/dL (ref 0.61–1.24)
GFR calc Af Amer: >60 mL/min (ref >60)
GFR calc non Af Amer: >60 mL/min (ref >60)
GLUCOSE: 190 mg/dL — AB (ref 65–99)
Potassium: 4.1 mmol/L (ref 3.5–5.1)
Sodium: 136 mmol/L (ref 135–145)

## 2015-12-17 LAB — HEPARIN LEVEL (UNFRACTIONATED): Heparin Unfractionated: 0.47 IU/mL (ref 0.30–0.70)

## 2015-12-17 LAB — MAGNESIUM: Magnesium: 1.5 mg/dL — ABNORMAL LOW (ref 1.7–2.4)

## 2015-12-17 MED ORDER — MAGNESIUM SULFATE 4 GM/100ML IV SOLN
4.0000 g | Freq: Once | INTRAVENOUS | Status: AC
  Administered 2015-12-17: 4 g via INTRAVENOUS
  Filled 2015-12-17: qty 100

## 2015-12-17 MED ORDER — FAT EMULSION 20 % IV EMUL
240.0000 mL | INTRAVENOUS | Status: AC
  Administered 2015-12-17: 240 mL via INTRAVENOUS
  Filled 2015-12-17: qty 250

## 2015-12-17 MED ORDER — TRACE MINERALS CR-CU-MN-SE-ZN 10-1000-500-60 MCG/ML IV SOLN
INTRAVENOUS | Status: AC
  Administered 2015-12-17: 17:00:00 via INTRAVENOUS
  Filled 2015-12-17: qty 1560

## 2015-12-17 NOTE — Progress Notes (Signed)
ANTICOAGULATION CONSULT NOTE - follow up  Pharmacy Consult for heparin Indication: DVT  Allergies  Allergen Reactions  . Zetia [Ezetimibe] Anaphylaxis and Swelling    Tongue and throat   . Atorvastatin Other (See Comments)    Malaise & muscle weakness  . Penicillins Other (See Comments)    Unknown.  Tolerates cefepime  . Pravastatin Sodium     Pravastatin 40 mg qday and 40 mg q M/W/F caused muscle aches  . Lisinopril Rash  . Promethazine Rash   Patient Measurements: Height: 6\' 2"  (188 cm) Weight: 157 lb 10.1 oz (71.5 kg) IBW/kg (Calculated) : 82.2 Heparin Dosing Weight: 72kg  Vital Signs: Temp: 98.4 F (36.9 C) (01/22 0654) Temp Source: Oral (01/22 0654) BP: 138/89 mmHg (01/22 0654) Pulse Rate: 85 (01/22 0654)  Labs:  Recent Labs  12/15/15 0530 12/15/15 1400 12/15/15 1948 12/16/15 0339 12/16/15 0824 12/17/15 0747  HGB 8.5*  --   --  7.7*  --  7.4*  HCT 25.8*  --   --  23.4*  --  22.8*  PLT PLATELET CLUMPS NOTED ON SMEAR, UNABLE TO ESTIMATE  --   --  305  --  297  HEPARINUNFRC 0.43  --  0.64 0.25* 0.35 0.47  CREATININE 0.92  --   --   --   --  0.90  TROPONINI <0.03 0.11* 0.13*  --   --   --     Estimated Creatinine Clearance: 85 mL/min (by C-G formula based on Cr of 0.9).  Assessment: 50 YOM known to pharmacy for TPN management and antibiotic dosing.  He is s/p ex lap on 1/6 and found to have perforated viscous and peritonitis. RUE doppler 1/11 reveals DVT of RUE along PICC site. PICC removed 1/11 and Left IJ placed.   Significant events: 1/11 baseline INR = 1.89, aPTT = 45sec 1/13 heparin held in AM for CT guided drain abscess drainage x 4 areas of fluid collection.  Heparin infusion resumed at previous rate (1800 units/hr) this afternoon at 1400 per IR instruction. Heparin level therapeutic after resumption of Heparin (0.40) 1/20 Bloody wound drainage reported.  Goal heparin level adjusted.  Today, 12/17/2015   RN reports that bleeding around bandages  continues but seems stable and has not gotten worse  Heparin level continues to be therapeutic on current rate of 2400 units/hr  Hgb low and dropping slowly, Plts stable.  Given reports of bleeding, will attempt to maintain heparin level at 0.3-0.5   Goal of Therapy:  Heparin level 0.3 - 0.5 units/mL Monitor platelets by anticoagulation protocol: Yes   Plan:   Continue heparin infusion at current rate of 2400 units/hr  Recheck heparin level in AM  Daily CBC and heparin level  Awaiting plan for long term anticoagulation  Monitor bleeding on abdominal wound closely   Adrian Saran, PharmD, BCPS Pager [phone] 12/17/2015 11:24 AM

## 2015-12-17 NOTE — Progress Notes (Signed)
16 Days Post-Op  Subjective: Pt with no issues overgnight  Objective: Vital signs in last 24 hours: Temp:  [98.4 F (36.9 C)-99.2 F (37.3 C)] 98.4 F (36.9 C) (01/22 0654) Pulse Rate:  [76-87] 85 (01/22 0654) Resp:  [16-20] 20 (01/22 0654) BP: (137-148)/(88-92) 138/89 mmHg (01/22 0654) SpO2:  [94 %-98 %] 98 % (01/22 0654) Last BM Date: 12/16/15  Intake/Output from previous day: 01/21 0701 - 01/22 0700 In: 1527 [I.V.:832; TPN:680] Out: 4580 [Urine:4500; Drains:80] Intake/Output this shift: Total I/O In: 10 [Other:10] Out: 200 [Urine:200]  General appearance: alert and cooperative GI: soft, non-tender; bowel sounds normal; no masses,  no organomegaly and some oozing from lower midline area  Lab Results:   Recent Labs  12/16/15 0339 12/17/15 0747  WBC 8.2 7.1  HGB 7.7* 7.4*  HCT 23.4* 22.8*  PLT 305 297   BMET  Recent Labs  12/15/15 0530 12/17/15 0747  NA 135 136  K 4.1 4.1  CL 99* 97*  CO2 28 31  GLUCOSE 104* 190*  BUN 22* 19  CREATININE 0.92 0.90  CALCIUM 8.1* 8.1*   PT/INR No results for input(s): LABPROT, INR in the last 72 hours. ABG No results for input(s): PHART, HCO3 in the last 72 hours.  Invalid input(s): PCO2, PO2  Studies/Results: Ct Image Guided Fluid Drain By Catheter  12/15/2015  CLINICAL DATA:  Small bowel perforation with multiple post-operative peritoneal fluid collections and increasing leukocytosis. Multiple percutaneous drainage catheters have previously been placed. There is a large undrained right subphrenic collection. EXAM: CT GUIDED DRAINAGE OF RIGHT SUBPHRENIC ABSCESS ANESTHESIA/SEDATION: Intravenous Fentanyl and Versed were administered as conscious sedation during continuous monitoring of the patient's level of consciousness and physiological / cardiorespiratory status by the radiology RN, with a total moderate sedation time of 10 minutes. PROCEDURE: The procedure, risks, benefits, and alternatives were explained to the  patient. Questions regarding the procedure were encouraged and answered. The patient understands and consents to the procedure. Select axial scans through the upper abdomen were obtained. Patient was placed in left posterior oblique position an additional scans obtained. The right subphrenic collection was localized and an appropriate skin entry site was identified and marked. The operative field was prepped with Betadinein a sterile fashion, and a sterile drape was applied covering the operative field. A sterile gown and sterile gloves were used for the procedure. Local anesthesia was provided with 1% Lidocaine. Under CT fluoroscopic guidance, a 19 gauge percutaneous entry needle was advanced into the inferior aspect of the collection. Thin somewhat cloudy pale yellow fluid could be aspirated. A guidewire advanced easily within the collection, its position confirmed on CT fluoro. Tract was dilated to facilitate placement of a 12 French pigtail catheter, formed centrally in the subphrenic component of the collection. 10 mL of opaque yellowish thin fluid were aspirated sent for Gram stain, culture and sensitivity. Catheter secured externally with 0 Prolene suture and StatLock and placed to gravity drain bag. The patient tolerated the procedure well. COMPLICATIONS: None immediate FINDINGS: Limited CT demonstrates some interval increase in bilateral pleural effusions since prior scan of 12/14/2015. Center venous catheter to the distal SVC. Patchy coronary calcifications. There is a complex gas and fluid collection extending from anterior to the right hepatic lobe into the right subhepatic space. There is no contains some contrast material since yesterday's catheter injection. The left upper quadrant drain catheter has been redirected into the subphrenic collection which has significantly resolved since the previous day's exam. Percutaneous 12 French pigtail drain catheter  placed into the right subphrenic collection as  detailed above. IMPRESSION: 1. Technically successful CT-guided right subphrenic abscess drain catheter placement. Electronically Signed   By: Lucrezia Europe M.D.   On: 12/15/2015 14:57    Anti-infectives: Anti-infectives    Start     Dose/Rate Route Frequency Ordered Stop   12/15/15 0800  metroNIDAZOLE (FLAGYL) tablet 500 mg     500 mg Oral Every 6 hours 12/15/15 0714     12/08/15 0800  metroNIDAZOLE (FLAGYL) IVPB 500 mg  Status:  Discontinued     500 mg 100 mL/hr over 60 Minutes Intravenous Every 6 hours 12/08/15 0737 12/15/15 0714   12/03/15 1800  vancomycin (VANCOCIN) IVPB 750 mg/150 ml premix  Status:  Discontinued     750 mg 150 mL/hr over 60 Minutes Intravenous Every 24 hours 12/03/15 1715 12/04/15 0911   12/02/15 1800  ceFEPIme (MAXIPIME) 1 g in dextrose 5 % 50 mL IVPB     1 g 100 mL/hr over 30 Minutes Intravenous Every 12 hours 12/02/15 1014 12/09/15 1759   12/02/15 0600  vancomycin (VANCOCIN) IVPB 1000 mg/200 mL premix  Status:  Discontinued     1,000 mg 200 mL/hr over 60 Minutes Intravenous 3 times per day 12/02/15 0547 12/02/15 0954   12/01/15 2200  ceFEPIme (MAXIPIME) 1 g in dextrose 5 % 50 mL IVPB  Status:  Discontinued     1 g 100 mL/hr over 30 Minutes Intravenous 3 times per day 12/01/15 1455 12/02/15 1014   11/30/15 1400  ceFEPIme (MAXIPIME) 1 g in dextrose 5 % 50 mL IVPB  Status:  Discontinued     1 g 100 mL/hr over 30 Minutes Intravenous 3 times per day 11/30/15 0913 12/01/15 1455   11/30/15 1000  metroNIDAZOLE (FLAGYL) IVPB 500 mg     500 mg 100 mL/hr over 60 Minutes Intravenous Every 8 hours 11/30/15 0858 12/07/15 0226   11/30/15 0645  vancomycin (VANCOCIN) IVPB 750 mg/150 ml premix  Status:  Discontinued     750 mg 150 mL/hr over 60 Minutes Intravenous 3 times per day 11/30/15 0630 12/02/15 0547   11/30/15 0645  imipenem-cilastatin (PRIMAXIN) 500 mg in sodium chloride 0.9 % 100 mL IVPB  Status:  Discontinued     500 mg 200 mL/hr over 30 Minutes Intravenous 3 times  per day 11/30/15 0630 11/30/15 0859      Assessment/Plan: POD#16 exlap, and repair of SB perforation in setting of SBO--Dr. Hassell Done 12/01/15 -pulmonary toilet, mobilize  -BID wet to dry dressing changes to open wound/retention sutures -Mobilze Post op intra-abdominal abscesses -RUQ drain placement -LUQ dran in place Post op ileus-resolved ID-on flagyl. only IR abscess culture NGTD RUE DVT-heparin gtt PCM-TPN until PO is adequate. Prealbumin is 2.7(1/16) FEN-adv to reg diet as tol Dispo-continue inpatient.    LOS: 26 days    Rosario Jacks., Anne Hahn 12/17/2015

## 2015-12-17 NOTE — Progress Notes (Signed)
Patient ID: [name], male   DOB: Jul 09, 1951, 65 y.o.   MRN: [id number] [name] PROGRESS NOTE  [name] [name] [id number] DOB: Jun 19, 1951 DOA: 11/21/2015 PCP: [name] Bravo, MD  Brief narrative:    65 year old very pleasant male with a past medical history of hyperlipidemia, hypertension, CAD, type 2 diabetes, diabetic peripheral neuropathy, seizure disorder, polysubstance abuse and chronic pain issues (on high doses of oxycodone prior to admission) who was admitted 11/22/15 with a small bowel obstruction. Surgical consultation was obtained on admission with conservative therapy initially recommended. Patient's bowel obstruction was felt to be from adhesions and possible narcotic bowel. His NG tube was removed 11/23/15 and he seemed to be improving clinically but pain returned 11/24/15 with diet advancement to clears. Repeat films showed worsening small bowel dilatation so his NG tube was reinserted. TPN was subsequently ordered. On 11/27/15, the patient refused PICC line insertion for TPN and surgical intervention. PICC finally placed 11/28/15 with initiation of TPN.   Further, patient has developed a right upper extremity DVT and currently is on IV heparin.  On 11/30/15, the patient's condition deteriorated acutely and he was found to be confused and restless. Rapid response was called. He was febrile and tachycardic with an elevated lactate level. Due to concerns for sepsis, broad-spectrum antibiotics were initiated with vancomycin and Primaxin which was switched to cefepime given his seizure history. Patient uderwent exploratory laparotomy on 12/01/15 with findings of perforated viscus with peritoneal contamination/peritonitis. He remained intubated postoperatively and was under the care of the critical care team. He self extubated 12/02/15. Surgery continues to follow, patient has an open abdominal wound. He continues to have an ileus and is on TNA at this time. [name]  assumed care 12/05/15.  Transferred to telemetry floor 12/08/2015.  Barrier to discharge: Slow improvement, ongoing pain, on TNA. Unable to predict when he will be ready for discharge but possible early next week. Chest pain overnight 1/20 with slight bump in troponin but also noted drop in Hg as well from 8.5 --> 7.7 so suspect demand ischemia from blood loss.   Assessment/Plan:    Principal problem: SBO w/ perforated viscous and resultant peritonitis / leukocytosis / sepsis / post op ileus  - S/p abdominal fluid collection drainages 1/13 with removal of RLQ drain 1/20, repositioning of LUQ drain 1/20 and placement of new right subphrenic drain 1/20 - check recent fluid cx's; cont drain irrigation; monitor output closely and rescan once minimal - other plans as per CCS  Active problems:  Chest pain 1/20 secondary to demand ischemia - mild elevation in troponin overnight 1/20 - no chest pain this AM but if occurs again, will cycle CE's - check 12 lead EKG with chest pain episodes  - Mg still low, supplement   Hypomagnesemia - supplement and repeat Mg level in AM  Inadequate oral intake  - In the context of acute on chronic illness - Diet advancement per Gen surgery - Continue nutritional support with TPN  Acute upper extremity DVT - US DVT right distal axillary vein, subclavian vein.  - Continue heparin drip - No reports of bleeding - continue to monitor CBC - transition to oral AC once pt able to tolerate PO   Post operative respiratory failure with hypoxemia - Patient required ventilatory support after exploratory laparotomy 12/01/2015.  - Patient self extubated 12/02/2015 - Respiratory status has improved with Lasix 80 mg IV given 12/05/2015 - Respiratory status continues to be stable off oxygen via [place]  Acute  Encephalopathy in setting of sepsis / history of seizure disorder - EEG done 11/30/2015 showed no acute seizures - Load with Keppra if seizures occur but so far no  seizures at all  Acute kidney injury - In the setting of sepsis and small bowel obstruction with perforated viscus - Creatinine normalized with hydration  Anemia of chronic disease / thrombocytopenia - Related to history of substance and alcohol use  - Hemoglobin trending down, pt wants to hold off on transfusion - repeat CBC in AM and if it continues to drop, will discuss again with pt   Controlled, diabetes mellitus type 2 with peripheral circulatory complications without long-term insulin use - will continue to monitor CBG - Continue sliding scale insulin for now   Left lower extremity wound / S/p left fem pop bypass w/ non-healing ulcer - WOC assessment done   DVT Prophylaxis  - SCD's bilaterally in hospital   Code Status: Full.  Family Communication:  plan of care discussed with the patient, family not at the bedside this am Disposition Plan: unable to predict when pt can be discharged, slowly improving   IV access:  Central line   Procedures and diagnostic studies from 12/05/2015:  Ct Abdomen Pelvis Wo Contrast 12/07/2015   1. There is small bilateral pleural effusion right greater than left. Significant atelectasis or infiltrate in right lower lobe posteriorly. There is superimposed mild interstitial prominence bilateral lower lobes probable mild interstitial edema. 2. There is moderate perihepatic and perisplenic ascites. Moderate ascites noted bilateral paracolic gutters. Please note there is some loculation of perihepatic ascites with mild mass effect on the liver contour please see axial images 32 and 15. Peritoneal inflammation or infection cannot be excluded. 3. No hydronephrosis or hydroureter.  Stable bilateral renal cysts. 4. Postsurgical changes are noted anterior abdominal wall. Incomplete healed/open subcutaneous midline anterior abdominal wound. 5. Mild distended small bowel loops containing oral contrast material and some air-fluid levels. There is no transition point  in caliber of small bowel. Findings most likely due to significant ileus or residual partial small bowel obstruction. There is some oral contrast material noted within distal colon. 6. Moderate pelvic ascites. Moderate gas noted within mid sigmoid colon probable mild ileus. 7. Significant anasarca infiltration of subcutaneous fat abdominal and pelvic wall. 8. 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 Electronically Signed   By: Lahoma Crocker M.D.   On: 12/07/2015 15:18   Dg Chest Port 1 View 12/06/2015  Stable bilateral pulmonary edema, with right basilar subsegmental atelectasis and associated pleural effusion. Interval placement of left internal jugular catheter with distal tip in SVC. No pneumothorax is noted. Electronically Signed   By: Marijo Conception, M.D.   On: 12/06/2015 16:10   Dg Chest Port 1 View  12/05/2015   Worsening of pulmonary interstitial infiltrates bilaterally. This may reflect pulmonary edema of cardiac or noncardiac cause or could reflect pneumonia. There are small bilateral pleural effusions. Electronically Signed   By: David  Martinique M.D.   On: 12/05/2015 08:52   Exploratory laparotomy on 12/01/15   Medical Consultants:  Surgery  CCM transfer care to [facility]10 PCT 12/08/2015 IR  Other Consultants:  Nutrition PT  IAnti-Infectives:   Flagyl 11-30-2015 --> Cefepime 12-02-2015 --> 12/10/2015   Faye Ramsay, MD  [name] Pager [phone]   If 7PM-7AM, please contact night-coverage [email or web address] Password [email or web address] 12/17/2015, 2:31 PM   LOS: 26 days   HPI/Subjective: No new complaints reported today  Objective: Filed Vitals:   12/16/15  0411 12/16/15 1820 12/16/15 2034 12/17/15 0654  BP: 140/85 148/88 137/92 138/89  Pulse: 82 76 87 85  Temp: 97.8 F (36.6 C) 99.2 F (37.3 C) 99 F (37.2 C) 98.4 F (36.9 C)  TempSrc: Oral Oral Oral Oral  Resp: 18 16 20 20   Height:      Weight:      SpO2: 93% 94% 94% 98%    Intake/Output Summary (Last  24 hours) at 12/17/15 1431 Last data filed at 12/17/15 1125  Gross per 24 hour  Intake     13 ml  Output   2350 ml  Net  -2337 ml    Exam:   General:  Pt is alert, awake and in nad  Cardiovascular: Rate controlled, (+) S1, S2 wnl  Respiratory:  No wheezing, no rhonchi  Abdomen: Has 2 JP drains, no rebound tenderness, appreciate bowel sounds  Extremities: No edema right lower extremity, has left unaboot (+)  Neuro:  No focal deficits   Data Reviewed: Basic Metabolic Panel:  Recent Labs Lab 12/12/15 0645 12/13/15 0530 12/14/15 0515 12/15/15 0530 12/16/15 0339 12/16/15 0800 12/17/15 0747  NA 135 135 137 135  --   --  136  K 3.8 4.4 4.1 4.1  --   --  4.1  CL 101 101 103 99*  --   --  97*  CO2 27 25 28 28   --   --  31  GLUCOSE 82 119* 96 104*  --   --  190*  BUN 36* 30* 26* 22*  --   --  19  CREATININE 1.28* 1.19 1.04 0.92  --   --  0.90  CALCIUM 8.1* 8.1* 8.3* 8.1*  --   --  8.1*  MG 1.8 1.5* 1.5* 1.4* 1.5* 1.4* 1.5*  PHOS 3.6 3.3 3.1 3.1 3.4  --  3.3   Liver Function Tests:  Recent Labs Lab 12/11/15 0518 12/14/15 0515  AST 21 21  ALT 11* 9*  ALKPHOS 74 101  BILITOT 0.9 0.5  PROT 5.8* 6.2*  ALBUMIN 1.9* 1.8*   CBC:  Recent Labs Lab 12/11/15 0518  12/13/15 0530 12/14/15 0515 12/15/15 0530 12/16/15 0339 12/17/15 0747  WBC 13.5*  < > 11.7* 10.8* 8.8 8.2 7.1  NEUTROABS 10.8*  --   --   --   --   --   --   HGB 9.0*  < > 8.3* 8.0* 8.5* 7.7* 7.4*  HCT 26.8*  < > 25.4* 24.8* 25.8* 23.4* 22.8*  MCV 85.6  < > 85.8 87.0 86.3 86.0 84.4  PLT 309  < > PLATELET CLUMPS NOTED ON SMEAR, UNABLE TO ESTIMATE 344 PLATELET CLUMPS NOTED ON SMEAR, UNABLE TO ESTIMATE 305 297  < > = values in this interval not displayed. Cardiac Enzymes:  Recent Labs Lab 12/15/15 0530 12/15/15 1400 12/15/15 1948  TROPONINI <0.03 0.11* 0.13*   CBG:  Recent Labs Lab 12/16/15 1210 12/16/15 1717 12/16/15 2217 12/17/15 0804 12/17/15 1229  GLUCAP 83 115* 169* 178* 161*     Recent Results (from the past 240 hour(s))  Culture, routine-abscess     Status: None   Collection Time: 12/08/15 12:41 PM  Result Value Ref Range Status   Specimen Description ABSCESS RLQ  Final   Special Requests NONE  Final   Gram Stain   Final    FEW WBC PRESENT,BOTH PMN AND MONONUCLEAR NO SQUAMOUS EPITHELIAL CELLS SEEN NO ORGANISMS SEEN Performed at [facility]   Final  NO GROWTH 2 DAYS Performed at [facility]    Report Status 12/10/2015 FINAL  Final  Anaerobic culture     Status: None   Collection Time: 12/08/15 12:41 PM  Result Value Ref Range Status   Specimen Description ABSCESS RLQ  Final   Special Requests NONE  Final   Gram Stain   Final    FEW WBC PRESENT,BOTH PMN AND MONONUCLEAR NO SQUAMOUS EPITHELIAL CELLS SEEN NO ORGANISMS SEEN Performed at [facility]    Culture   Final    NO ANAEROBES ISOLATED Performed at [facility]    Report Status 12/13/2015 FINAL  Final  Culture, routine-abscess     Status: None   Collection Time: 12/08/15 12:41 PM  Result Value Ref Range Status   Specimen Description ABSCESS PERIHEPATIC RUQ   Final   Special Requests NONE  Final   Gram Stain   Final    FEW WBC PRESENT, PREDOMINANTLY MONONUCLEAR NO SQUAMOUS EPITHELIAL CELLS SEEN NO ORGANISMS SEEN Performed at [facility]    Culture   Final    NO GROWTH 2 DAYS Performed at [facility]    Report Status 12/10/2015 FINAL  Final  Anaerobic culture     Status: None   Collection Time: 12/08/15 12:41 PM  Result Value Ref Range Status   Specimen Description ABSCESS PERIHEPATIC RUQ  Final   Special Requests NONE  Final   Gram Stain   Final    FEW WBC PRESENT, PREDOMINANTLY MONONUCLEAR NO SQUAMOUS EPITHELIAL CELLS SEEN NO ORGANISMS SEEN Performed at [facility]    Culture   Final    NO ANAEROBES ISOLATED Performed at [facility]    Report Status 12/13/2015 FINAL  Final  Culture,  routine-abscess     Status: None   Collection Time: 12/08/15 12:41 PM  Result Value Ref Range Status   Specimen Description ABSCESS LUQ  Final   Special Requests NONE  Final   Gram Stain   Final    ABUNDANT WBC PRESENT,BOTH PMN AND MONONUCLEAR NO SQUAMOUS EPITHELIAL CELLS SEEN NO ORGANISMS SEEN Performed at [facility]    Culture   Final    NO GROWTH 2 DAYS Performed at [facility]    Report Status 12/10/2015 FINAL  Final  Anaerobic culture     Status: None   Collection Time: 12/08/15 12:41 PM  Result Value Ref Range Status   Specimen Description ABSCESS LUQ  Final   Special Requests NONE  Final   Gram Stain   Final    ABUNDANT WBC PRESENT,BOTH PMN AND MONONUCLEAR NO SQUAMOUS EPITHELIAL CELLS SEEN NO ORGANISMS SEEN Performed at [facility]    Culture   Final    NO ANAEROBES ISOLATED Performed at [facility]    Report Status 12/13/2015 FINAL  Final  Culture, routine-abscess     Status: None (Preliminary result)   Collection Time: 12/15/15 11:12 AM  Result Value Ref Range Status   Specimen Description ABSCESS CT RUQ DRAIN  Final   Special Requests Normal  Final   Gram Stain   Final    ABUNDANT WBC PRESENT,BOTH PMN AND MONONUCLEAR NO SQUAMOUS EPITHELIAL CELLS SEEN NO ORGANISMS SEEN Performed at [facility]    Culture   Final    NO GROWTH 1 DAY Performed at [facility]    Report Status PENDING  Incomplete  Culture, routine-abscess     Status: None (Preliminary result)   Collection Time: 12/15/15  1:48 PM  Result Value Ref Range Status   Specimen Description JP DRAINAGE  Final   Special Requests NONE  Final   Gram Stain   Final    ABUNDANT WBC PRESENT,BOTH PMN AND MONONUCLEAR NO SQUAMOUS EPITHELIAL CELLS SEEN NO ORGANISMS SEEN Performed at [facility]    Culture   Final    NO GROWTH 1 DAY Performed at [facility]    Report Status PENDING  Incomplete    Scheduled Meds: . fentaNYL  injection  50 mcg Intravenous Once  . insulin aspart  0-15 Units Subcutaneous TID WC  . metroNIDAZOLE  500 mg Oral Q6H  . thiamine  100 mg Oral Daily   Continuous Infusions: . Marland KitchenTPN (CLINIMIX-E) Adult 65 mL/hr at 12/16/15 1729   And  . fat emulsion 240 mL (12/16/15 1729)  . Marland KitchenTPN (CLINIMIX-E) Adult     And  . fat emulsion    . heparin 2,400 Units/hr (12/17/15 1412)

## 2015-12-17 NOTE — Progress Notes (Signed)
Midline abdominal incision continues with bleeding and saturating dressings. Abdominal dressing changed 4 times on day shift per orders. Tension sutures and abdominal binder intact.

## 2015-12-17 NOTE — Progress Notes (Signed)
PARENTERAL NUTRITION CONSULT NOTE - FOLLOW-UP  Pharmacy Consult for TPN Indication: Bowel obstruction, post op ileus  Allergies  Allergen Reactions  . Zetia [Ezetimibe] Anaphylaxis and Swelling    Tongue and throat   . Atorvastatin Other (See Comments)    Malaise & muscle weakness  . Penicillins Other (See Comments)    Unknown.  Tolerates cefepime  . Pravastatin Sodium     Pravastatin 40 mg qday and 40 mg q M/W/F caused muscle aches  . Lisinopril Rash  . Promethazine Rash   Patient Measurements: Height: 6\' 2"  (188 cm) Weight: 157 lb 10.1 oz (71.5 kg) IBW/kg (Calculated) : 82.2 Usual Weight: 137-140 lbs  Vital Signs: Temp: 98.4 F (36.9 C) (01/22 0654) Temp Source: Oral (01/22 0654) BP: 138/89 mmHg (01/22 0654) Pulse Rate: 85 (01/22 0654) Intake/Output from previous day: 01/21 0701 - 01/22 0700 In: 1527 [I.V.:832; TPN:680] Out: 4580 [Urine:4500; Drains:80] Intake/Output from this shift: Total I/O In: 13 [I.V.:3; Other:10] Out: 200 [Urine:200] Labs:  Recent Labs  12/15/15 0530 12/16/15 0339 12/17/15 0747  WBC 8.8 8.2 7.1  HGB 8.5* 7.7* 7.4*  HCT 25.8* 23.4* 22.8*  PLT PLATELET CLUMPS NOTED ON SMEAR, UNABLE TO ESTIMATE 305 297    Recent Labs  12/15/15 0530 12/16/15 0339 12/16/15 0800 12/17/15 0747  NA 135  --   --  136  K 4.1  --   --  4.1  CL 99*  --   --  97*  CO2 28  --   --  31  GLUCOSE 104*  --   --  190*  BUN 22*  --   --  19  CREATININE 0.92  --   --  0.90  CALCIUM 8.1*  --   --  8.1*  MG 1.4* 1.5* 1.4* 1.5*  PHOS 3.1 3.4  --  3.3  Corr Ca 9.7 Estimated Creatinine Clearance: 85 mL/min (by C-G formula based on Cr of 0.9).    Recent Labs  12/16/15 1717 12/16/15 2217 12/17/15 0804  GLUCAP 115* 169* 178*   Medical History: Past Medical History  Diagnosis Date  . Hypertension   . CAD (coronary artery disease)   . PVD (peripheral vascular disease) ([place])   . MI (myocardial infarction) ([place])   . HLD (hyperlipidemia)    Medications:   Infusions:  . Marland KitchenTPN (CLINIMIX-E) Adult 65 mL/hr at 12/16/15 1729   And  . fat emulsion 240 mL (12/16/15 1729)  . heparin 2,400 Units/hr (12/17/15 0237)   Insulin Requirements in the past 24 hours: 7 units SSI/24h +  10 units Regular Insulin in TPN/24h  Current Nutrition:  Mechanical soft - reported fair appetite Clinimix-E 5/15 at 65 mL/hr Fat Emulsion 20% at 10 mL/hr  IVF: LR 80 ml/hr  Central access: PICC placed 1/3 TPN start date: 1/3  ASSESSMENT                                                                                                          HPI: 64 yoM presented to ED on 12/27 with abdominal pain.  PMH  includes HTN, HLD, PVD - s/p fem bypass, CAD s/p NSTEMI 2014, cardiomyopathy, DM, diabetic peripheral neuropathy, seizures, and chronic pain on chronic opioids.  Surgical history significant for open AAA repair.  CT shows partial SBO, likely adhesions.  Patient refused surgery, until 1/6 s/p repair of perforated visous.  Plan for conservative management with TPN, bowel rest.   Significant events:  1/1: TPN per pharmacy ordered - unable to start without central access.  1/3: PICC line placed; TPN started 1/5: Acute encephalopathy with fevers (102F) overnight- sepsis protocol initiated.  Broad-spectrum abx started.  1/6: Consented to laparotomy w/ findings of perforated viscus and peritonitis. Txfer to ICU   1/7:  AKI 1/10: NGT removed 1/11: started clear liquid diet 1/13 NPO again for CT guided drain placement for perihepatic fluid collection suspicious for abscess. FL diet post-procedure 1/16: per CCS, no bowel sounds noted today, patient needs to be walked as much as possible 1/18: started full liquid diet, tolerating small amounts. AKI resolved. 1/19: IR: RLQ and RUO drains removed, LUQ drain repositioned 1/22: advanced diet to mechanical soft on 1/21  Today 12/17/2015:   Glucose: starting to rise likely more from increasing diet, Range 83-178, Hx DM on glipizide PTA.    Electrolytes - Mg still low - will replace.  Renal -  SCr remains WNL. UOP up, 2.6 mL/kg/hr  I/O record appears incomplete for intake  Drain: output 80 mL  LFTs - all below ULN  TGs -  (Hx of HPL) 124 (1/2), 164 (1/9), 95 (1/16)  Prealbumin - 3.7 (1/2), < 2 (1/9), 2.7 (1/16)  NUTRITIONAL GOALS                                                                                             RD recs: 1600-1800 Kcal/day, 65-80 g protein/day, > 2 L fluid/day  Clinimix E 5/15 at a goal rate of 65 ml/hr + 20% fat emulsion at 10 ml/hr to provide: 78 g/day protein, 1588 Kcal/day.  PLAN                                                                                                Continue Clinimix E 5/15 at goal rate of 65 ml/h  Continue with 10 units of insulin / 24 hr added to bag for now as likely to start weaning TPN next couple of days - monitor CBGs  Continue 20% fat emulsion at 10 ml/hr  Standard MVI, MTE in TPN bag.  Continue CBGs with moderate correction insulin for meal coverage only   Repeat magnesium sulfate 4g IV x 1 today  TPN lab panels on Mondays & Thursdays.   Follow up toleration of mechanical soft diet and potentially start weaning TPN beginning tomorrow 1/23  Adrian Saran, PharmD, BCPS Pager [phone] 12/17/2015 11:26 AM

## 2015-12-18 DIAGNOSIS — R1013 Epigastric pain: Secondary | ICD-10-CM

## 2015-12-18 LAB — TRIGLYCERIDES: Triglycerides: 118 mg/dL (ref <150)

## 2015-12-18 LAB — CBC
HCT: 15.6 % — ABNORMAL LOW (ref 39.0–52.0)
HEMOGLOBIN: 5.1 g/dL — AB (ref 13.0–17.0)
MCH: 28.3 pg (ref 26.0–34.0)
MCHC: 32.7 g/dL (ref 30.0–36.0)
MCV: 86.7 fL (ref 78.0–100.0)
PLATELETS: 377 10*3/uL (ref 150–400)
RBC: 1.8 MIL/uL — AB (ref 4.22–5.81)
RDW: 15.5 % (ref 11.5–15.5)
WBC: 11.2 10*3/uL — ABNORMAL HIGH (ref 4.0–10.5)

## 2015-12-18 LAB — PHOSPHORUS: Phosphorus: 3.1 mg/dL (ref 2.5–4.6)

## 2015-12-18 LAB — COMPREHENSIVE METABOLIC PANEL
ALBUMIN: 1.8 g/dL — AB (ref 3.5–5.0)
ALK PHOS: 111 U/L (ref 38–126)
ALT: 11 U/L — AB (ref 17–63)
AST: 21 U/L (ref 15–41)
Anion gap: 6 (ref 5–15)
BILIRUBIN TOTAL: 0.4 mg/dL (ref 0.3–1.2)
BUN: 22 mg/dL — AB (ref 6–20)
CO2: 30 mmol/L (ref 22–32)
CREATININE: 0.95 mg/dL (ref 0.61–1.24)
Calcium: 8.2 mg/dL — ABNORMAL LOW (ref 8.9–10.3)
Chloride: 97 mmol/L — ABNORMAL LOW (ref 101–111)
GFR calc Af Amer: >60 mL/min (ref >60)
GFR calc non Af Amer: >60 mL/min (ref >60)
GLUCOSE: 231 mg/dL — AB (ref 65–99)
POTASSIUM: 4.5 mmol/L (ref 3.5–5.1)
Sodium: 133 mmol/L — ABNORMAL LOW (ref 135–145)
TOTAL PROTEIN: 6.7 g/dL (ref 6.5–8.1)

## 2015-12-18 LAB — DIFFERENTIAL
BASOS PCT: 1 %
Basophils Absolute: 0.1 10*3/uL (ref 0.0–0.1)
EOS PCT: 2 %
Eosinophils Absolute: 0.2 10*3/uL (ref 0.0–0.7)
LYMPHS PCT: 29 %
Lymphs Abs: 3.2 10*3/uL (ref 0.7–4.0)
Monocytes Absolute: 0.7 10*3/uL (ref 0.1–1.0)
Monocytes Relative: 6 %
NEUTROS ABS: 7 10*3/uL (ref 1.7–7.7)
Neutrophils Relative %: 62 %

## 2015-12-18 LAB — CULTURE, ROUTINE-ABSCESS
Culture: NO GROWTH
Culture: NO GROWTH
Special Requests: NORMAL

## 2015-12-18 LAB — GLUCOSE, CAPILLARY
GLUCOSE-CAPILLARY: 227 mg/dL — AB (ref 65–99)
GLUCOSE-CAPILLARY: 211 mg/dL — AB (ref 65–99)
Glucose-Capillary: 220 mg/dL — ABNORMAL HIGH (ref 65–99)
Glucose-Capillary: 154 mg/dL — ABNORMAL HIGH (ref 65–99)

## 2015-12-18 LAB — PREPARE RBC (CROSSMATCH)

## 2015-12-18 LAB — MAGNESIUM: Magnesium: 1.8 mg/dL (ref 1.7–2.4)

## 2015-12-18 LAB — PREALBUMIN: Prealbumin: 3 mg/dL — ABNORMAL LOW (ref 18–38)

## 2015-12-18 LAB — ABO/RH: ABO/RH(D): A NEG

## 2015-12-18 LAB — HEPARIN LEVEL (UNFRACTIONATED): HEPARIN UNFRACTIONATED: 0.62 [IU]/mL (ref 0.30–0.70)

## 2015-12-18 MED ORDER — SODIUM CHLORIDE 0.9 % IV SOLN
Freq: Once | INTRAVENOUS | Status: AC
  Administered 2015-12-18: 13:00:00 via INTRAVENOUS

## 2015-12-18 MED ORDER — INSULIN ASPART 100 UNIT/ML ~~LOC~~ SOLN
0.0000 [IU] | Freq: Three times a day (TID) | SUBCUTANEOUS | Status: DC
  Administered 2015-12-18 (×2): 7 [IU] via SUBCUTANEOUS
  Administered 2015-12-18 – 2015-12-19 (×2): 4 [IU] via SUBCUTANEOUS
  Administered 2015-12-19: 7 [IU] via SUBCUTANEOUS
  Administered 2015-12-19: 4 [IU] via SUBCUTANEOUS
  Administered 2015-12-19: 3 [IU] via SUBCUTANEOUS
  Administered 2015-12-20: 4 [IU] via SUBCUTANEOUS
  Administered 2015-12-20: 3 [IU] via SUBCUTANEOUS
  Administered 2015-12-20: 4 [IU] via SUBCUTANEOUS
  Administered 2015-12-20 – 2015-12-21 (×2): 3 [IU] via SUBCUTANEOUS
  Administered 2015-12-21: 4 [IU] via SUBCUTANEOUS

## 2015-12-18 MED ORDER — METHOCARBAMOL 500 MG PO TABS
1000.0000 mg | ORAL_TABLET | Freq: Three times a day (TID) | ORAL | Status: DC
  Administered 2015-12-18 – 2015-12-29 (×31): 1000 mg via ORAL
  Filled 2015-12-18 (×46): qty 2

## 2015-12-18 MED ORDER — ENSURE ENLIVE PO LIQD
237.0000 mL | Freq: Three times a day (TID) | ORAL | Status: DC
  Administered 2015-12-18 – 2015-12-30 (×27): 237 mL via ORAL

## 2015-12-18 MED ORDER — FAT EMULSION 20 % IV EMUL
240.0000 mL | INTRAVENOUS | Status: DC
  Administered 2015-12-18: 240 mL via INTRAVENOUS
  Filled 2015-12-18: qty 250

## 2015-12-18 MED ORDER — TRACE MINERALS CR-CU-MN-SE-ZN 10-1000-500-60 MCG/ML IV SOLN
INTRAVENOUS | Status: DC
  Administered 2015-12-18: 17:00:00 via INTRAVENOUS
  Filled 2015-12-18: qty 1560

## 2015-12-18 NOTE — Progress Notes (Signed)
PARENTERAL NUTRITION CONSULT NOTE - FOLLOW-UP  Pharmacy Consult for TPN Indication: Bowel obstruction, post op ileus  Allergies  Allergen Reactions  . Zetia [Ezetimibe] Anaphylaxis and Swelling    Tongue and throat   . Atorvastatin Other (See Comments)    Malaise & muscle weakness  . Penicillins Other (See Comments)    Unknown.  Tolerates cefepime  . Pravastatin Sodium     Pravastatin 40 mg qday and 40 mg q M/W/F caused muscle aches  . Lisinopril Rash  . Promethazine Rash   Patient Measurements: Height: 6\' 2"  (188 cm) Weight: 157 lb 10.1 oz (71.5 kg) IBW/kg (Calculated) : 82.2 Usual Weight: 137-140 lbs (62.2-63.6 kg)  Vital Signs: Temp: 98.4 F (36.9 C) (01/23 0645) Temp Source: Oral (01/23 0645) BP: 92/64 mmHg (01/23 0645) Pulse Rate: 101 (01/23 0645) Intake/Output from previous day: 01/22 0701 - 01/23 0700 In: 1360 [P.O.:360; I.V.:195; IV Piggyback:100; TPN:680] Out: 1560 [Urine:1500; Drains:60] Intake/Output from this shift:   Labs:  Recent Labs  12/16/15 0339 12/17/15 0747 12/18/15 0630  WBC 8.2 7.1 11.2*  HGB 7.7* 7.4* 5.1*  HCT 23.4* 22.8* 15.6*  PLT 305 297 377    Recent Labs  12/16/15 0339 12/16/15 0800 12/17/15 0747 12/18/15 0630  NA  --   --  136 133*  K  --   --  4.1 4.5  CL  --   --  97* 97*  CO2  --   --  31 30  GLUCOSE  --   --  190* 231*  BUN  --   --  19 22*  CREATININE  --   --  0.90 0.95  CALCIUM  --   --  8.1* 8.2*  MG 1.5* 1.4* 1.5* 1.8  PHOS 3.4  --  3.3 3.1  PROT  --   --   --  6.7  ALBUMIN  --   --   --  1.8*  AST  --   --   --  21  ALT  --   --   --  11*  ALKPHOS  --   --   --  111  BILITOT  --   --   --  0.4  TRIG  --   --   --  118  Corr Ca 9.7 Estimated Creatinine Clearance: 80.5 mL/min (by C-G formula based on Cr of 0.95).    Recent Labs  12/17/15 1748 12/17/15 2217 12/18/15 0755  GLUCAP 187* 258* 227*   Medical History: Past Medical History  Diagnosis Date  . Hypertension   . CAD (coronary artery  disease)   . PVD (peripheral vascular disease) ([place])   . MI (myocardial infarction) ([place])   . HLD (hyperlipidemia)    Medications:  Infusions:  . Marland KitchenTPN (CLINIMIX-E) Adult 65 mL/hr at 12/17/15 1716   And  . fat emulsion 240 mL (12/17/15 1716)   Insulin Requirements in the past 24 hours: 9 units SSI/24h +  10 units Regular Insulin in TPN/24h  Current Nutrition:  Mechanical soft - poor appetite, denies N/V Clinimix-E 5/15 at 65 mL/hr Fat Emulsion 20% at 10 mL/hr  IVF: none  Central access: PICC placed 1/3 TPN start date: 1/3  ASSESSMENT  HPI: 55 yoM presented to ED on 12/27 with abdominal pain.  PMH includes HTN, HLD, PVD - s/p fem bypass, CAD s/p NSTEMI 2014, cardiomyopathy, DM, diabetic peripheral neuropathy, seizures, and chronic pain on chronic opioids.  Surgical history significant for open AAA repair.  CT shows partial SBO, likely adhesions.  Patient refused surgery, until 1/6 s/p repair of perforated visous.  Plan for conservative management with TPN, bowel rest.   Significant events:  1/1: TPN per pharmacy ordered - unable to start without central access.  1/3: PICC line placed; TPN started 1/5: Acute encephalopathy with fevers (102F) overnight- sepsis protocol initiated.  Broad-spectrum abx started.  1/6: Consented to laparotomy w/ findings of perforated viscus and peritonitis. Txfer to ICU   1/7:  AKI 1/10: NGT removed 1/11: started clear liquid diet 1/13 NPO again for CT guided drain placement for perihepatic fluid collection suspicious for abscess. FL diet post-procedure 1/16: per CCS, no bowel sounds noted today, patient needs to be walked as much as possible 1/18: started full liquid diet, tolerating small amounts. AKI resolved. 1/19: IR: RLQ and RUO drains removed, LUQ drain repositioned 1/22: advanced diet to mechanical soft on 1/21 1/23 drains continue to put out  yellow purulent drainage  Today 12/18/2015:   Glucose: uncontrolled now despite being controlled at goal TPN rate for weeks, possibly due to start of oral diet.  Will adjust SSI to resistant scale with meals and at bedtime since bedtime CBG was elevated last night and coverage wasn't administered.  Hx DM on glipizide PTA.   Electrolytes - Mg WNL after replacement. K/Phos WNL. CorrCa 9.96. Na and Cl low today, likely d/t d/c of LR infusion.  IVF d/c'd by MD 1/21 pm.  Renal -  SCr remains WNL.   I/O record appears incomplete for intake  Drain: output 60 mL  LFTs - all below ULN  TGs -  (Hx of HPL) 124 (1/2), 164 (1/9), 95 (1/16), 118 (1/23)  Prealbumin - 3.7 (1/2), < 2 (1/9), 2.7 (1/16), today's lab in process  NUTRITIONAL GOALS                                                                                             RD recs: 1600-1800 Kcal/day, 65-80 g protein/day, > 2 L fluid/day  Clinimix E 5/15 at a goal rate of 65 ml/hr + 20% fat emulsion at 10 ml/hr to provide: 78 g/day protein, 1588 Kcal/day.  PLAN                                                                                                Continue Clinimix E 5/15 at goal rate of 65 ml/h  Continue with 10 units of insulin / 24 hr added to bag  for now.  Continue 20% fat emulsion at 10 ml/hr  Standard MVI, MTE in TPN bag.  Change SSI to resistant scale with meals and at bedtime.  TPN lab panels on Mondays & Thursdays. Recheck BMET, Mag in AM.  Follow up toleration of mechanical soft diet.  Ensure ordered TID today.  Will begin to wean TPN once PO intake improved.  [name] [name], PharmD, BCPS Pager: [phone] 12/18/2015 9:36 AM

## 2015-12-18 NOTE — Progress Notes (Signed)
ANTICOAGULATION CONSULT NOTE - follow up  Pharmacy Consult for heparin Indication: DVT  Allergies  Allergen Reactions  . Zetia [Ezetimibe] Anaphylaxis and Swelling    Tongue and throat   . Atorvastatin Other (See Comments)    Malaise & muscle weakness  . Penicillins Other (See Comments)    Unknown.  Tolerates cefepime  . Pravastatin Sodium     Pravastatin 40 mg qday and 40 mg q M/W/F caused muscle aches  . Lisinopril Rash  . Promethazine Rash   Patient Measurements: Height: 6\' 2"  (188 cm) Weight: 157 lb 10.1 oz (71.5 kg) IBW/kg (Calculated) : 82.2 Heparin Dosing Weight: 72kg  Vital Signs: Temp: 98.4 F (36.9 C) (01/23 0645) Temp Source: Oral (01/23 0645) BP: 92/64 mmHg (01/23 0645) Pulse Rate: 101 (01/23 0645)  Labs:  Recent Labs  12/15/15 1400  12/15/15 1948  12/16/15 0339 12/16/15 0824 12/17/15 0747 12/18/15 0630  HGB  --   --   --   < > 7.7*  --  7.4* 5.1*  HCT  --   --   --   --  23.4*  --  22.8* 15.6*  PLT  --   --   --   --  305  --  297 377  HEPARINUNFRC  --   < > 0.64  --  0.25* 0.35 0.47 0.62  CREATININE  --   --   --   --   --   --  0.90 0.95  TROPONINI 0.11*  --  0.13*  --   --   --   --   --   < > = values in this interval not displayed.  Estimated Creatinine Clearance: 80.5 mL/min (by C-G formula based on Cr of 0.95).  Assessment: 12 YOM known to pharmacy for TPN management and antibiotic dosing.  He is s/p ex lap on 1/6 and found to have perforated viscous and peritonitis. RUE doppler 1/11 reveals DVT of RUE along PICC site. PICC removed 1/11 and Left IJ placed.   Significant events: 1/11 baseline INR = 1.89, aPTT = 45sec 1/13 heparin held in AM for CT guided drain abscess drainage x 4 areas of fluid collection.  Heparin infusion resumed at previous rate (1800 units/hr) this afternoon at 1400 per IR instruction. Heparin level therapeutic after resumption of Heparin (0.40) 1/20 Bloody wound drainage reported.  Goal heparin level adjusted. 1/22 RN  reports that bleeding around bandages continues but seems stable and has not gotten worse.  Today, 12/18/2015   Hgb dropped to 5.1. RN reported continued bleeding from abdominal incision. MD stopped heparin infusion and ordered 2uPRBC.  Heparin level was 0.62 this AM with infusion at 2400 units/hr.  Was trying to maintain narrow heparin level goal of 0.3-0.5 due to some bleeding at abdominal incision.  Goal of Therapy:  Heparin level 0.3 - 0.5 units/mL Monitor platelets by anticoagulation protocol: Yes   Plan:  Heparin infusion on hold. F/u Hgb following transfusion and MD plan for anticoagulation going forward.  Hershal Coria, PharmD, BCPS Pager: [phone] 12/18/2015 9:33 AM

## 2015-12-18 NOTE — Progress Notes (Signed)
CRITICAL VALUE ALERT  Critical value received:  HGB 5.1  Date of notification:  12/18/2015  Time of notification:  [id number]  Critical value read back:Yes.    Nurse who received alert:  Fara Olden RN  MD notified (1st page):  I. Doyle Askew, MD  Time of first page:  0723  MD notified (2nd page):  Time of second page:  Responding MD:  I. MYERS  Time MD responded:  [phone]

## 2015-12-18 NOTE — Progress Notes (Signed)
Patient ID: [name] [name], male   DOB: 01/18/1951, 65 y.o.   MRN: [id number] [name] PROGRESS NOTE  [name] [name] [id number] DOB: [birth year] DOA: 11/21/2015 PCP: Marijean Bravo, MD  Brief narrative:    65 year old very pleasant male with a past medical history of hyperlipidemia, hypertension, CAD, type 2 diabetes, diabetic peripheral neuropathy, seizure disorder, polysubstance abuse and chronic pain issues (on high doses of oxycodone prior to admission) who was admitted 11/22/15 with a small bowel obstruction. Surgical consultation was obtained on admission with conservative therapy initially recommended. Patient's bowel obstruction was felt to be from adhesions and possible narcotic bowel. His NG tube was removed 11/23/15 and he seemed to be improving clinically but pain returned 11/24/15 with diet advancement to clears. Repeat films showed worsening small bowel dilatation so his NG tube was reinserted. TPN was subsequently ordered. On 11/27/15, the patient refused PICC line insertion for TPN and surgical intervention. PICC finally placed 11/28/15 with initiation of TPN.   Further, patient has developed a right upper extremity DVT and currently is on IV heparin.  On 11/30/15, the patient's condition deteriorated acutely and he was found to be confused and restless. Rapid response was called. He was febrile and tachycardic with an elevated lactate level. Due to concerns for sepsis, broad-spectrum antibiotics were initiated with vancomycin and Primaxin which was switched to cefepime given his seizure history. Patient uderwent exploratory laparotomy on 12/01/15 with findings of perforated viscus with peritoneal contamination/peritonitis. He remained intubated postoperatively and was under the care of the critical care team. He self extubated 12/02/15. Surgery continues to follow, patient has an open abdominal wound. He continues to have an ileus and is on TNA at this time. [name]  assumed care 12/05/15.  Transferred to telemetry floor 12/08/2015.  Barrier to discharge: Slow improvement, ongoing pain, on TNA. Unable to predict when he will be ready for discharge but possible early next week. Chest pain overnight 1/20 with slight bump in troponin but also noted drop in Hg as well from 8.5 --> 7.7 so suspect demand ischemia from blood loss.   Assessment/Plan:    Principal problem: SBO w/ perforated viscous and resultant peritonitis / leukocytosis / sepsis / post op ileus  - S/p abdominal fluid collection drainages 1/13 with removal of RLQ drain 1/20, repositioning of LUQ drain 1/20 and placement of new right subphrenic drain 1/20 - check recent fluid cx's; cont drain irrigation; monitor output closely and rescan once minimal - other plans as per CCS  Active problems:  Chest pain 1/20 secondary to demand ischemia - mild elevation in troponin overnight 1/20 - no chest pain this AM but if occurs again, will cycle CE's  Hypomagnesemia - supplement and repeat Mg level in AM  Inadequate oral intake  - In the context of acute on chronic illness - Diet advancement per Gen surgery - Continue nutritional support with TPN  Acute upper extremity DVT - US DVT right distal axillary vein, subclavian vein.  - stop heparin drip due to drop in Hg - continue to monitor CBC  Post operative respiratory failure with hypoxemia - Patient required ventilatory support after exploratory laparotomy 12/01/2015.  - Patient self extubated 12/02/2015 - Respiratory status has improved with Lasix 80 mg IV given 12/05/2015 - Respiratory status continues to be stable off oxygen via [place]  Acute Encephalopathy in setting of sepsis / history of seizure disorder - EEG done 11/30/2015 showed no acute seizures - Load with Keppra if seizures occur but so far  no seizures at all  Acute kidney injury - In the setting of sepsis and small bowel obstruction with perforated viscus - Creatinine  normalized with hydration  Anemia of chronic disease / thrombocytopenia - Related to history of substance and alcohol use  - Hemoglobin trending down, transfuse two units PRBC 1/23  - repeat CBC in AM   Controlled, diabetes mellitus type 2 with peripheral circulatory complications without long-term insulin use - will continue to monitor CBG - Continue sliding scale insulin for now   Left lower extremity wound / S/p left fem pop bypass w/ non-healing ulcer - WOC assessment done   DVT Prophylaxis  - SCD's bilaterally in hospital   Code Status: Full.  Family Communication:  plan of care discussed with the patient, family not at the bedside this am Disposition Plan: unable to predict when pt can be discharged, slowly improving   IV access:  Central line   Procedures and diagnostic studies from 12/05/2015:  Ct Abdomen Pelvis Wo Contrast 12/07/2015   1. There is small bilateral pleural effusion right greater than left. Significant atelectasis or infiltrate in right lower lobe posteriorly. There is superimposed mild interstitial prominence bilateral lower lobes probable mild interstitial edema. 2. There is moderate perihepatic and perisplenic ascites. Moderate ascites noted bilateral paracolic gutters. Please note there is some loculation of perihepatic ascites with mild mass effect on the liver contour please see axial images 32 and 15. Peritoneal inflammation or infection cannot be excluded. 3. No hydronephrosis or hydroureter.  Stable bilateral renal cysts. 4. Postsurgical changes are noted anterior abdominal wall. Incomplete healed/open subcutaneous midline anterior abdominal wound. 5. Mild distended small bowel loops containing oral contrast material and some air-fluid levels. There is no transition point in caliber of small bowel. Findings most likely due to significant ileus or residual partial small bowel obstruction. There is some oral contrast material noted within distal colon. 6. Moderate  pelvic ascites. Moderate gas noted within mid sigmoid colon probable mild ileus. 7. Significant anasarca infiltration of subcutaneous fat abdominal and pelvic wall. 8. 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 Electronically Signed   By: Lahoma Crocker M.D.   On: 12/07/2015 15:18   Dg Chest Port 1 View 12/06/2015  Stable bilateral pulmonary edema, with right basilar subsegmental atelectasis and associated pleural effusion. Interval placement of left internal jugular catheter with distal tip in SVC. No pneumothorax is noted. Electronically Signed   By: Marijo Conception, M.D.   On: 12/06/2015 16:10   Dg Chest Port 1 View  12/05/2015   Worsening of pulmonary interstitial infiltrates bilaterally. This may reflect pulmonary edema of cardiac or noncardiac cause or could reflect pneumonia. There are small bilateral pleural effusions. Electronically Signed   By: David  Martinique M.D.   On: 12/05/2015 08:52   Exploratory laparotomy on 12/01/15   Medical Consultants:  Surgery  [facility] transfer care to [facility] PCT 12/08/2015 IR  Other Consultants:  Nutrition PT  IAnti-Infectives:   Flagyl 11-30-2015 --> Cefepime 12-02-2015 --> 12/10/2015   Faye Ramsay, MD  [name] Pager [phone]   If 7PM-7AM, please contact night-coverage [email or web address] Password [email or web address] 12/18/2015, 2:01 PM   LOS: 27 days   HPI/Subjective: No new complaints reported today  Objective: Filed Vitals:   12/18/15 0645 12/18/15 1000 12/18/15 1304 12/18/15 1330  BP: 92/64 111/55 103/62 104/67  Pulse: 101 101 100 102  Temp: 98.4 F (36.9 C) 98.6 F (37 C) 98.8 F (37.1 C) 99 F (37.2 C)  TempSrc: Oral  Oral Oral Oral  Resp:      Height:      Weight:      SpO2: 98% 98% 91% 100%    Intake/Output Summary (Last 24 hours) at 12/18/15 1401 Last data filed at 12/18/15 0645  Gross per 24 hour  Intake    475 ml  Output   1050 ml  Net   -575 ml    Exam:   General:  Pt looks tired and  weak  Cardiovascular: Rate controlled, (+) S1, S2 wnl  Respiratory:  No wheezing, no rhonchi  Abdomen: Has 2 JP drains, no rebound tenderness, appreciate bowel sounds  Extremities: No edema right lower extremity, has left unaboot (+)  Neuro:  No focal deficits   Data Reviewed: Basic Metabolic Panel:  Recent Labs Lab 12/13/15 0530 12/14/15 0515 12/15/15 0530 12/16/15 0339 12/16/15 0800 12/17/15 0747 12/18/15 0630  NA 135 137 135  --   --  136 133*  K 4.4 4.1 4.1  --   --  4.1 4.5  CL 101 103 99*  --   --  97* 97*  CO2 25 28 28   --   --  31 30  GLUCOSE 119* 96 104*  --   --  190* 231*  BUN 30* 26* 22*  --   --  19 22*  CREATININE 1.19 1.04 0.92  --   --  0.90 0.95  CALCIUM 8.1* 8.3* 8.1*  --   --  8.1* 8.2*  MG 1.5* 1.5* 1.4* 1.5* 1.4* 1.5* 1.8  PHOS 3.3 3.1 3.1 3.4  --  3.3 3.1   Liver Function Tests:  Recent Labs Lab 12/14/15 0515 12/18/15 0630  AST 21 21  ALT 9* 11*  ALKPHOS 101 111  BILITOT 0.5 0.4  PROT 6.2* 6.7  ALBUMIN 1.8* 1.8*   CBC:  Recent Labs Lab 12/14/15 0515 12/15/15 0530 12/16/15 0339 12/17/15 0747 12/18/15 0630  WBC 10.8* 8.8 8.2 7.1 11.2*  NEUTROABS  --   --   --   --  7.0  HGB 8.0* 8.5* 7.7* 7.4* 5.1*  HCT 24.8* 25.8* 23.4* 22.8* 15.6*  MCV 87.0 86.3 86.0 84.4 86.7  PLT 344 PLATELET CLUMPS NOTED ON SMEAR, UNABLE TO ESTIMATE 305 297 377   Cardiac Enzymes:  Recent Labs Lab 12/15/15 0530 12/15/15 1400 12/15/15 1948  TROPONINI <0.03 0.11* 0.13*   CBG:  Recent Labs Lab 12/17/15 1229 12/17/15 1748 12/17/15 2217 12/18/15 0755 12/18/15 1153  GLUCAP 161* 187* 258* 227* 211*    Recent Results (from the past 240 hour(s))  Culture, routine-abscess     Status: None   Collection Time: 12/15/15 11:12 AM  Result Value Ref Range Status   Specimen Description ABSCESS CT RUQ DRAIN  Final   Special Requests Normal  Final   Gram Stain   Final    ABUNDANT WBC PRESENT,BOTH PMN AND MONONUCLEAR NO SQUAMOUS EPITHELIAL CELLS SEEN NO  ORGANISMS SEEN Performed at [facility]    Culture   Final    NO GROWTH 2 DAYS Performed at [facility]    Report Status 12/18/2015 FINAL  Final  Culture, routine-abscess     Status: None   Collection Time: 12/15/15  1:48 PM  Result Value Ref Range Status   Specimen Description JP DRAINAGE  Final   Special Requests NONE  Final   Gram Stain   Final    ABUNDANT WBC PRESENT,BOTH PMN AND MONONUCLEAR NO SQUAMOUS EPITHELIAL CELLS SEEN NO ORGANISMS SEEN Performed at  [facility]   Final    NO GROWTH 2 DAYS Performed at [facility]    Report Status 12/18/2015 FINAL  Final    Scheduled Meds: . fentaNYL injection  50 mcg Intravenous Once  . insulin aspart  0-15 Units Subcutaneous TID WC  . metroNIDAZOLE  500 mg Oral Q6H  . thiamine  100 mg Oral Daily   Continuous Infusions: . Marland KitchenTPN (CLINIMIX-E) Adult 65 mL/hr at 12/17/15 1716   And  . fat emulsion 240 mL (12/17/15 1716)  . Marland KitchenTPN (CLINIMIX-E) Adult     And  . fat emulsion

## 2015-12-18 NOTE — Progress Notes (Signed)
Date: December 18, 2015 Chart reviewed for concurrent status and case management needs. Will continue to follow patient for changes and needs: hgb down to 5.4 receiving bld Velva Harman, BSN, [place], [place],   [phone]

## 2015-12-18 NOTE — Progress Notes (Signed)
PT Cancellation Note  Patient Details Name: [name] [name] MRN: [id number] DOB: 02-Nov-1951   Cancelled Treatment:    Reason Eval/Treat Not Completed: Medical issues which prohibited therapy (hgb 5.1 and bleeding from abdominal wound.)   Claretha Cooper 12/18/2015, 8:22 AM Tresa Endo PT [phone]

## 2015-12-18 NOTE — Progress Notes (Signed)
Patient ID: [name] [name], male   DOB: 1951-04-21, 65 y.o.   MRN: [id number]    Referring Physician(s): CCS  Chief Complaint:  Abdominal fluid collections  Subjective: Pt feeling ok. Still has pain  Allergies: Zetia; Atorvastatin; Penicillins; Pravastatin sodium; Lisinopril; and Promethazine  Medications:  Current facility-administered medications:  .  0.9 %  sodium chloride infusion, , Intravenous, Once, Theodis Blaze, MD .  acetaminophen (TYLENOL) suppository 650 mg, 650 mg, Rectal, Q6H PRN, Michael Boston, MD, 650 mg at 12/13/15 0425 .  albuterol (PROVENTIL) (2.5 MG/3ML) 0.083% nebulizer solution 2.5 mg, 2.5 mg, Inhalation, Q6H PRN, Florencia Reasons, MD .  alum & mag hydroxide-simeth (MAALOX/MYLANTA) 200-200-20 MG/5ML suspension 30 mL, 30 mL, Oral, Q6H PRN, Michael Boston, MD, 30 mL at 12/13/15 0312 .  antiseptic oral rinse (CPC / CETYLPYRIDINIUM CHLORIDE 0.05%) solution 7 mL, 7 mL, Mouth Rinse, q12n4p, Kara Mead V, MD, 7 mL at 12/17/15 1401 .  bacitracin ointment, , Topical, Daily, Florencia Reasons, MD .  bisacodyl (DULCOLAX) suppository 10 mg, 10 mg, Rectal, Q12H PRN, Michael Boston, MD .  chlorhexidine (PERIDEX) 0.12 % solution 15 mL, 15 mL, Mouth Rinse, BID, Kara Mead V, MD, 15 mL at 12/18/15 1230 .  collagenase (SANTYL) ointment 1 application, 1 application, Topical, Daily, Florencia Reasons, MD, 1 application at 0000000 [phone] .  diphenhydrAMINE (BENADRYL) injection 12.5-25 mg, 12.5-25 mg, Intravenous, Q6H PRN, Michael Boston, MD, 25 mg at 12/10/15 0845 .  TPN (CLINIMIX-E) Adult, , Intravenous, Continuous TPN, Last Rate: 65 mL/hr at 12/17/15 1716 **AND** fat emulsion 20 % infusion 240 mL, 240 mL, Intravenous, Continuous TPN, Adrian Saran, [facility], Last Rate: 10 mL/hr at 12/17/15 1716, 240 mL at 12/17/15 1716 .  TPN (CLINIMIX-E) Adult, , Intravenous, Continuous TPN **AND** fat emulsion 20 % infusion 240 mL, 240 mL, Intravenous, Continuous TPN, Dara Hoyer, RPH .  feeding supplement (ENSURE ENLIVE) (ENSURE  ENLIVE) liquid 237 mL, 237 mL, Oral, TID BM, Megan N Baird, PA-C, 237 mL at 12/18/15 1000 .  fentaNYL (SUBLIMAZE) injection 50 mcg, 50 mcg, Intravenous, Once, Jacqulynn Cadet, MD, 50 mcg at 12/14/15 1805 .  hydrALAZINE (APRESOLINE) injection 10-20 mg, 10-20 mg, Intravenous, Q6H PRN, Donita Brooks, NP, 20 mg at 12/04/15 1823 .  HYDROmorphone (DILAUDID) injection 2-3 mg, 2-3 mg, Intravenous, Q2H PRN, Robbie Lis, MD, 3 mg at 12/18/15 1224 .  insulin aspart (novoLOG) injection 0-20 Units, 0-20 Units, Subcutaneous, TID AC & HS, Dara Hoyer, RPH .  lip balm (CARMEX) ointment 1 application, 1 application, Topical, BID, Michael Boston, MD, 1 application at 0000000 1000 .  magic mouthwash, 15 mL, Oral, QID PRN, Michael Boston, MD, 15 mL at 12/17/15 2258 .  menthol-cetylpyridinium (CEPACOL) lozenge 3 mg, 1 lozenge, Oral, PRN, Michael Boston, MD .  methocarbamol (ROBAXIN) tablet 1,000 mg, 1,000 mg, Oral, TID, Nat Christen, PA-C, 1,000 mg at 12/18/15 1223 .  metroNIDAZOLE (FLAGYL) tablet 500 mg, 500 mg, Oral, Q6H, Emina Riebock, NP, 500 mg at 12/18/15 0901 .  ondansetron (ZOFRAN) injection 4 mg, 4 mg, Intravenous, Q6H PRN, Dianne Dun, NP, 4 mg at 12/11/15 0228 .  oxyCODONE (Oxy IR/ROXICODONE) immediate release tablet 10 mg, 10 mg, Oral, Q4H PRN, Robbie Lis, MD, 10 mg at 12/18/15 0901 .  phenol (CHLORASEPTIC) mouth spray 2 spray, 2 spray, Mouth/Throat, PRN, Michael Boston, MD .  sodium chloride (OCEAN) 0.65 % nasal spray 1 spray, 1 spray, Each Nare, PRN, Robbie Lis, MD .  sodium chloride 0.9 %  injection 10-40 mL, 10-40 mL, Intracatheter, PRN, Florencia Reasons, MD, 10 mL at 12/01/15 0315 .  sodium chloride 0.9 % injection 10-40 mL, 10-40 mL, Intracatheter, PRN, Robbie Lis, MD, 10 mL at 12/17/15 0408 .  sodium chloride 0.9 % injection 3 mL, 3 mL, Intravenous, Q12H, Reubin Milan, MD, 3 mL at 12/17/15 1125 .  thiamine (VITAMIN B-1) tablet 100 mg, 100 mg, Oral, Daily, 100 mg at 12/18/15 0901  **OR** [DISCONTINUED] thiamine (B-1) injection 100 mg, 100 mg, Intravenous, Daily, Michael Boston, MD, 100 mg at 12/08/15 0908 .  triamcinolone cream (KENALOG) 0.1 % 1 application, 1 application, Topical, BID PRN, Florencia Reasons, MD    Vital Signs: BP 104/67 mmHg  Pulse 102  Temp(Src) 99 F (37.2 C) (Oral)  Resp 20  Ht 6\' 2"  (1.88 m)  Wt 157 lb 10.1 oz (71.5 kg)  BMI 20.23 kg/m2  SpO2 100%  Physical Exam RUQ/LUQ drains intact, insertion sites ok,  Right drain with cloudy output with debris Left drain with thin but purulent output  Imaging: Dg Chest 2 View  12/14/2015  CLINICAL DATA:  65 year old male with postoperative respiratory failure, hypoxemia and sepsis. EXAM: CHEST  2 VIEW COMPARISON:  12/06/2015 chest radiograph.  12/14/2015 abdominal CT. FINDINGS: The cardiomediastinal silhouette is unchanged. A left IJ central venous catheter with tip overlying the lower SVC again noted. Moderate bilateral pleural effusions have increased in size, right greater than left. Increasing bilateral lower lung atelectasis/ consolidation noted. There is no evidence of pneumothorax. Pulmonary vascular congestion is present. IMPRESSION: Enlarging moderate bilateral pleural effusions, right greater than left, with increasing bilateral lower lung atelectasis/ consolidation. Pulmonary vascular congestion. Electronically Signed   By: Margarette Canada M.D.   On: 12/14/2015 19:11   Ir Sinus/fist Tube Chk-non Gi  12/15/2015  CLINICAL DATA:  65 yo male with a history of perforated small bowel and acute abdomen status post exploratory laparotomy, washout and small bowel repair. Rib his postoperative course has been complicated by development of multiple intra-abdominal abscesses. Percutaneous drainage was performed 12/08/2015 with placement of 3 drainage catheters. CT imaging earlier today demonstrates that the right lower quadrant drainage catheter has successfully drained at the intended fluid collection. This drain can be  removed. Both of the upper quadrant drainage catheters need to be repositioned into the subdiaphragmatic spaces to drain persistent fluid in those regions. EXAM: EXAM SINUS TRACT INJECTION/FISTULOGRAM Date: 12/15/2015 PROCEDURE: PROCEDURE 1. Removal of right lower quadrant drain 2. Attempted repositioning of right upper quadrant drain with ultimate removal of drainage catheter 3. Successful repositioning and up size of left upper quadrant drain Interventional Radiologist: Criselda Peaches, MD ANESTHESIA/SEDATION: ANESTHESIA/SEDATION 50 mcg fentanyl administered during the course of the procedure MEDICATIONS: MEDICATIONS None additional FLUOROSCOPY TIME:  11 minutes 30 seconds for a total of 167 mGy CONTRAST:  69mL OMNIPAQUE IOHEXOL 300 MG/ML SOLN TECHNIQUE: TECHNIQUE Informed consent was obtained from the patient following explanation of the procedure, risks, benefits and alternatives. The patient understands, agrees and consents for the procedure. All questions were addressed. A time out was performed. Maximal barrier sterile technique utilized including caps, mask, sterile gowns, sterile gloves, large sterile drape, hand hygiene, and Betadine skin prep. The right lower quadrant drainage catheter was cut and removed. Contrast was injected through the right upper quadrant drainage catheter. This confirms that there is no residual abscess cavity at the drainage a loop of the catheter. However, shortly beyond the entry point of the catheter into the abdominal wall there is a communication  with a larger fluid collection in the subdiaphragmatic space. This catheter was cut and removed over a Bentson wire. A 5 French catheter was navigated over the wire. Multiple attempts with several wires were made in an effort to reposition the wire into the subdiaphragmatic space. This was ultimately unsuccessful. The decision was made to pursue replacement of the right upper quadrant drainage catheter under CT guidance. Attention  was turned to the left upper quadrant drainage catheter. Contrast was injected, again, shortly beyond the entry point into the left upper quadrant there is a communication with the larger subdiaphragmatic fluid collection. The fluid collection around the draining loop of the catheter has resolved. This catheter was cut and removed over a Bentson wire. Using a 5 French angled catheter and glidewire, the eat wire was successfully navigated into did subdiaphragmatic space. The catheter was advanced into the space in the glidewire exchanged for an Amplatz wire. A Cook 12 [place] all-purpose drainage catheter was advanced over the wire and formed in the subdiaphragmatic space. There was immediate return of approximately 150 cc of opaque tan fluid and debris. The catheter was secured in place with 0 Prolene suture and connected to JP bulb drainage. Bandages were applied. The patient tolerated the procedure well. COMPLICATIONS: COMPLICATIONS None IMPRESSION: IMPRESSION 1. The right lower quadrant drain was removed. 2. Unsuccessful attempted repositioning of the right upper quadrant drainage catheter. The catheter could not be manipulated up into the subdiaphragmatic space and was therefore removed. This catheter will be replaced under CT guidance tomorrow 12/14/14. 3. Successful repositioning of the left upper quadrant drainage catheter into the subdiaphragmatic collection with return of copious opaque tan colored fluid and debris. Electronically Signed   By: Jacqulynn Cadet M.D.   On: 12/15/2015 17:08   Ir Sinus/fist Tube Chk-non Gi  12/15/2015  CLINICAL DATA:  65 yo male with a history of perforated small bowel and acute abdomen status post exploratory laparotomy, washout and small bowel repair. Rib his postoperative course has been complicated by development of multiple intra-abdominal abscesses. Percutaneous drainage was performed 12/08/2015 with placement of 3 drainage catheters. CT imaging earlier today  demonstrates that the right lower quadrant drainage catheter has successfully drained at the intended fluid collection. This drain can be removed. Both of the upper quadrant drainage catheters need to be repositioned into the subdiaphragmatic spaces to drain persistent fluid in those regions. EXAM: SINUS TRACT INJECTION/FISTULOGRAM Date: 12/15/2015 PROCEDURE: 1. Removal of right lower quadrant drain 2. Attempted repositioning of right upper quadrant drain with ultimate removal of drainage catheter 3. Successful repositioning and up size of left upper quadrant drain Interventional Radiologist:  Criselda Peaches, MD ANESTHESIA/SEDATION: 50 mcg fentanyl administered during the course of the procedure MEDICATIONS: None additional FLUOROSCOPY TIME:  11 minutes 30 seconds for a total of 167 mGy CONTRAST:  108mL OMNIPAQUE IOHEXOL 300 MG/ML  SOLN TECHNIQUE: Informed consent was obtained from the patient following explanation of the procedure, risks, benefits and alternatives. The patient understands, agrees and consents for the procedure. All questions were addressed. A time out was performed. Maximal barrier sterile technique utilized including caps, mask, sterile gowns, sterile gloves, large sterile drape, hand hygiene, and Betadine skin prep. The right lower quadrant drainage catheter was cut and removed. Contrast was injected through the right upper quadrant drainage catheter. This confirms that there is no residual abscess cavity at the drainage a loop of the catheter. However, shortly beyond the entry point of the catheter into the abdominal wall there is a communication  with a larger fluid collection in the subdiaphragmatic space. This catheter was cut and removed over a Bentson wire. A 5 French catheter was navigated over the wire. Multiple attempts with several wires were made in an effort to reposition the wire into the subdiaphragmatic space. This was ultimately unsuccessful. The decision was made to pursue  replacement of the right upper quadrant drainage catheter under CT guidance. Attention was turned to the left upper quadrant drainage catheter. Contrast was injected, again, shortly beyond the entry point into the left upper quadrant there is a communication with the larger subdiaphragmatic fluid collection. The fluid collection around the draining loop of the catheter has resolved. This catheter was cut and removed over a Bentson wire. Using a 5 French angled catheter and glidewire, the eat wire was successfully navigated into did subdiaphragmatic space. The catheter was advanced into the space in the glidewire exchanged for an Amplatz wire. A Cook 12 [place] all-purpose drainage catheter was advanced over the wire and formed in the subdiaphragmatic space. There was immediate return of approximately 150 cc of opaque tan fluid and debris. The catheter was secured in place with 0 Prolene suture and connected to JP bulb drainage. Bandages were applied. The patient tolerated the procedure well. COMPLICATIONS: None IMPRESSION: 1. The right lower quadrant drain was removed. 2. Unsuccessful attempted repositioning of the right upper quadrant drainage catheter. The catheter could not be manipulated up into the subdiaphragmatic space and was therefore removed. This catheter will be replaced under CT guidance tomorrow 12/14/14. 3. Successful repositioning of the left upper quadrant drainage catheter into the subdiaphragmatic collection with return of copious opaque tan colored fluid and debris. Signed, Criselda Peaches, MD [facility] Electronically Signed   By: Jacqulynn Cadet M.D.   On: 12/15/2015 07:49   Ir Catheter Tube Change  12/15/2015  CLINICAL DATA:  65 yo male with a history of perforated small bowel and acute abdomen status post exploratory laparotomy, washout and small bowel repair. Rib his postoperative course has been complicated by development of  multiple intra-abdominal abscesses. Percutaneous drainage was performed 12/08/2015 with placement of 3 drainage catheters. CT imaging earlier today demonstrates that the right lower quadrant drainage catheter has successfully drained at the intended fluid collection. This drain can be removed. Both of the upper quadrant drainage catheters need to be repositioned into the subdiaphragmatic spaces to drain persistent fluid in those regions. EXAM: EXAM SINUS TRACT INJECTION/FISTULOGRAM Date: 12/15/2015 PROCEDURE: PROCEDURE 1. Removal of right lower quadrant drain 2. Attempted repositioning of right upper quadrant drain with ultimate removal of drainage catheter 3. Successful repositioning and up size of left upper quadrant drain Interventional Radiologist: Criselda Peaches, MD ANESTHESIA/SEDATION: ANESTHESIA/SEDATION 50 mcg fentanyl administered during the course of the procedure MEDICATIONS: MEDICATIONS None additional FLUOROSCOPY TIME:  11 minutes 30 seconds for a total of 167 mGy CONTRAST:  10mL OMNIPAQUE IOHEXOL 300 MG/ML SOLN TECHNIQUE: TECHNIQUE Informed consent was obtained from the patient following explanation of the procedure, risks, benefits and alternatives. The patient understands, agrees and consents for the procedure. All questions were addressed. A time out was performed. Maximal barrier sterile technique utilized including caps, mask, sterile gowns, sterile gloves, large sterile drape, hand hygiene, and Betadine skin prep. The right lower quadrant drainage catheter was cut and removed. Contrast was injected through the right upper quadrant drainage catheter. This confirms that there is no residual abscess cavity at the drainage a loop of the catheter. However, shortly beyond the entry  point of the catheter into the abdominal wall there is a communication with a larger fluid collection in the subdiaphragmatic space. This catheter was cut and removed over a Bentson wire. A 5 French catheter was  navigated over the wire. Multiple attempts with several wires were made in an effort to reposition the wire into the subdiaphragmatic space. This was ultimately unsuccessful. The decision was made to pursue replacement of the right upper quadrant drainage catheter under CT guidance. Attention was turned to the left upper quadrant drainage catheter. Contrast was injected, again, shortly beyond the entry point into the left upper quadrant there is a communication with the larger subdiaphragmatic fluid collection. The fluid collection around the draining loop of the catheter has resolved. This catheter was cut and removed over a Bentson wire. Using a 5 French angled catheter and glidewire, the eat wire was successfully navigated into did subdiaphragmatic space. The catheter was advanced into the space in the glidewire exchanged for an Amplatz wire. A Cook 12 [place] all-purpose drainage catheter was advanced over the wire and formed in the subdiaphragmatic space. There was immediate return of approximately 150 cc of opaque tan fluid and debris. The catheter was secured in place with 0 Prolene suture and connected to JP bulb drainage. Bandages were applied. The patient tolerated the procedure well. COMPLICATIONS: COMPLICATIONS None IMPRESSION: IMPRESSION 1. The right lower quadrant drain was removed. 2. Unsuccessful attempted repositioning of the right upper quadrant drainage catheter. The catheter could not be manipulated up into the subdiaphragmatic space and was therefore removed. This catheter will be replaced under CT guidance tomorrow 12/14/14. 3. Successful repositioning of the left upper quadrant drainage catheter into the subdiaphragmatic collection with return of copious opaque tan colored fluid and debris. Electronically Signed   By: Jacqulynn Cadet M.D.   On: 12/15/2015 17:08   Ct Image Guided Fluid Drain By Catheter  12/15/2015  CLINICAL DATA:  Small bowel perforation with multiple post-operative  peritoneal fluid collections and increasing leukocytosis. Multiple percutaneous drainage catheters have previously been placed. There is a large undrained right subphrenic collection. EXAM: CT GUIDED DRAINAGE OF RIGHT SUBPHRENIC ABSCESS ANESTHESIA/SEDATION: Intravenous Fentanyl and Versed were administered as conscious sedation during continuous monitoring of the patient's level of consciousness and physiological / cardiorespiratory status by the radiology RN, with a total moderate sedation time of 10 minutes. PROCEDURE: The procedure, risks, benefits, and alternatives were explained to the patient. Questions regarding the procedure were encouraged and answered. The patient understands and consents to the procedure. Select axial scans through the upper abdomen were obtained. Patient was placed in left posterior oblique position an additional scans obtained. The right subphrenic collection was localized and an appropriate skin entry site was identified and marked. The operative field was prepped with Betadinein a sterile fashion, and a sterile drape was applied covering the operative field. A sterile gown and sterile gloves were used for the procedure. Local anesthesia was provided with 1% Lidocaine. Under CT fluoroscopic guidance, a 19 gauge percutaneous entry needle was advanced into the inferior aspect of the collection. Thin somewhat cloudy pale yellow fluid could be aspirated. A guidewire advanced easily within the collection, its position confirmed on CT fluoro. Tract was dilated to facilitate placement of a 12 French pigtail catheter, formed centrally in the subphrenic component of the collection. 10 mL of opaque yellowish thin fluid were aspirated sent for Gram stain, culture and sensitivity. Catheter secured externally with 0 Prolene suture and StatLock and placed to gravity drain bag. The patient  tolerated the procedure well. COMPLICATIONS: None immediate FINDINGS: Limited CT demonstrates some interval  increase in bilateral pleural effusions since prior scan of 12/14/2015. Center venous catheter to the distal SVC. Patchy coronary calcifications. There is a complex gas and fluid collection extending from anterior to the right hepatic lobe into the right subhepatic space. There is no contains some contrast material since yesterday's catheter injection. The left upper quadrant drain catheter has been redirected into the subphrenic collection which has significantly resolved since the previous day's exam. Percutaneous 12 French pigtail drain catheter placed into the right subphrenic collection as detailed above. IMPRESSION: 1. Technically successful CT-guided right subphrenic abscess drain catheter placement. Electronically Signed   By: Lucrezia Europe M.D.   On: 12/15/2015 14:57    Labs:  CBC:  Recent Labs  12/15/15 0530 12/16/15 0339 12/17/15 0747 12/18/15 0630  WBC 8.8 8.2 7.1 11.2*  HGB 8.5* 7.7* 7.4* 5.1*  HCT 25.8* 23.4* 22.8* 15.6*  PLT PLATELET CLUMPS NOTED ON SMEAR, UNABLE TO ESTIMATE 305 297 377    COAGS:  Recent Labs  07/26/15 1415 12/06/15 0925 12/08/15 0340  INR 1.09 1.89* 1.82*  APTT 37 45*  --     BMP:  Recent Labs  12/14/15 0515 12/15/15 0530 12/17/15 0747 12/18/15 0630  NA 137 135 136 133*  K 4.1 4.1 4.1 4.5  CL 103 99* 97* 97*  CO2 28 28 31 30   GLUCOSE 96 104* 190* 231*  BUN 26* 22* 19 22*  CALCIUM 8.3* 8.1* 8.1* 8.2*  CREATININE 1.04 0.92 0.90 0.95  GFRNONAA >60 >60 >60 >60  GFRAA >60 >60 >60 >60    LIVER FUNCTION TESTS:  Recent Labs  12/07/15 0445 12/11/15 0518 12/14/15 0515 12/18/15 0630  BILITOT 0.9 0.9 0.5 0.4  AST 27 21 21 21   ALT 14* 11* 9* 11*  ALKPHOS 72 74 101 111  PROT 5.3* 5.8* 6.2* 6.7  ALBUMIN 1.8* 1.9* 1.8* 1.8*    Assessment and Plan: S/p abdominal fluid collection drainages 1/13 with removal of RLQ drain 1/20, repositioning of LUQ drain 1/20 and placement of new right subphrenic drain 1/20 Check recent fluid cx's; cont  drain irrigation; monitor output closely and rescan once minimal; other plans as per CCS/IM  Electronically Signed: Ascencion Dike 12/18/2015, 2:08 PM   I spent a total of 15 minutes at the the patient's bedside AND on the patient's hospital floor or unit, greater than 50% of which was counseling/coordinating care for abdominal fluid collection drains

## 2015-12-18 NOTE — Progress Notes (Signed)
[facility] Progress Note  17 Days Post-Op  Subjective: Pt c/o a lot of pain, he's know's he must be getting "addicted to it".  He denies N/v, tolerating some regular diet.  Drains in place still putting out pus.  Working with therapies.  Deconditioned.  Bleeding from wound has become more profound.  His Hgb was down to 5.1 this am.  Nurses had to change dressing twice overnight due to saturation.  Mostly c/o left foot pain.  Objective: Vital signs in last 24 hours: Temp:  [98.2 F (36.8 C)-99.7 F (37.6 C)] 98.4 F (36.9 C) (01/23 0645) Pulse Rate:  [95-101] 101 (01/23 0645) Resp:  [20-21] 20 (01/23 0244) BP: (92-128)/(64-74) 92/64 mmHg (01/23 0645) SpO2:  [94 %-100 %] 98 % (01/23 0645) Last BM Date: 12/16/15  Intake/Output from previous day: 01/22 0701 - 01/23 0700 In: 1360 [P.O.:360; I.V.:195; IV Piggyback:100; TPN:680] Out: 1560 [Urine:1500; Drains:60] Intake/Output this shift:    PE: Gen:  Alert, NAD, pleasant Card:  RRR, no M/G/R heard Pulm:  CTA, no W/R/R, good effort Abd: Soft, some tenderness, ND, +BS, no HSM, incisions soft and flat with retention sutures in place.  Bright red blood on dressings, congealed blood at incision, wound looks clean with pale tissues and some granulation tissue.  Right abdominal drain with seropurulent drainage, left abdominal drain with yellow purulent drainage. Ext:  RUE quite swollen compared to other extremities, Boot on left foot and dressings in place   Lab Results:   Recent Labs  12/17/15 0747 12/18/15 0630  WBC 7.1 11.2*  HGB 7.4* 5.1*  HCT 22.8* 15.6*  PLT 297 377   BMET  Recent Labs  12/17/15 0747 12/18/15 0630  NA 136 133*  K 4.1 4.5  CL 97* 97*  CO2 31 30  GLUCOSE 190* 231*  BUN 19 22*  CREATININE 0.90 0.95  CALCIUM 8.1* 8.2*   PT/INR No results for input(s): LABPROT, INR in the last 72 hours. CMP     Component Value Date/Time   NA 133* 12/18/2015 0630   K 4.5 12/18/2015 0630   CL 97*  12/18/2015 0630   CO2 30 12/18/2015 0630   GLUCOSE 231* 12/18/2015 0630   BUN 22* 12/18/2015 0630   CREATININE 0.95 12/18/2015 0630   CALCIUM 8.2* 12/18/2015 0630   PROT 6.7 12/18/2015 0630   ALBUMIN 1.8* 12/18/2015 0630   AST 21 12/18/2015 0630   ALT 11* 12/18/2015 0630   ALKPHOS 111 12/18/2015 0630   BILITOT 0.4 12/18/2015 0630   GFRNONAA >60 12/18/2015 0630   GFRAA >60 12/18/2015 0630   Lipase     Component Value Date/Time   LIPASE 21 11/21/2015 1621       Studies/Results: No results found.  Anti-infectives: Anti-infectives    Start     Dose/Rate Route Frequency Ordered Stop   12/15/15 0800  metroNIDAZOLE (FLAGYL) tablet 500 mg     500 mg Oral Every 6 hours 12/15/15 0714     12/08/15 0800  metroNIDAZOLE (FLAGYL) IVPB 500 mg  Status:  Discontinued     500 mg 100 mL/hr over 60 Minutes Intravenous Every 6 hours 12/08/15 0737 12/15/15 0714   12/03/15 1800  vancomycin (VANCOCIN) IVPB 750 mg/150 ml premix  Status:  Discontinued     750 mg 150 mL/hr over 60 Minutes Intravenous Every 24 hours 12/03/15 1715 12/04/15 0911   12/02/15 1800  ceFEPIme (MAXIPIME) 1 g in dextrose 5 % 50 mL IVPB     1 g 100 mL/hr  over 30 Minutes Intravenous Every 12 hours 12/02/15 1014 12/09/15 1759   12/02/15 0600  vancomycin (VANCOCIN) IVPB 1000 mg/200 mL premix  Status:  Discontinued     1,000 mg 200 mL/hr over 60 Minutes Intravenous 3 times per day 12/02/15 0547 12/02/15 0954   12/01/15 2200  ceFEPIme (MAXIPIME) 1 g in dextrose 5 % 50 mL IVPB  Status:  Discontinued     1 g 100 mL/hr over 30 Minutes Intravenous 3 times per day 12/01/15 1455 12/02/15 1014   11/30/15 1400  ceFEPIme (MAXIPIME) 1 g in dextrose 5 % 50 mL IVPB  Status:  Discontinued     1 g 100 mL/hr over 30 Minutes Intravenous 3 times per day 11/30/15 0913 12/01/15 1455   11/30/15 1000  metroNIDAZOLE (FLAGYL) IVPB 500 mg     500 mg 100 mL/hr over 60 Minutes Intravenous Every 8 hours 11/30/15 0858 12/07/15 0226   11/30/15 0645   vancomycin (VANCOCIN) IVPB 750 mg/150 ml premix  Status:  Discontinued     750 mg 150 mL/hr over 60 Minutes Intravenous 3 times per day 11/30/15 0630 12/02/15 0547   11/30/15 0645  imipenem-cilastatin (PRIMAXIN) 500 mg in sodium chloride 0.9 % 100 mL IVPB  Status:  Discontinued     500 mg 200 mL/hr over 30 Minutes Intravenous 3 times per day 11/30/15 0630 11/30/15 0859       Assessment/Plan POD#17 exlap, and repair of SB perforation in setting of SBO--Dr. Hassell Done 12/01/15 -Pulmonary toilet, mobilize  -BID wet to dry dressing changes to open wound/retention sutures -Mobilze  -Incisional bleeding, increase frequency of dressing changes -Add robaxin, d/c Toradol due to bleeding Post op intra-abdominal abscesses -RUQ drain placement 1/20 Dr. Vernard Gambles with seropurulent drainage -LUQ bulb drain in place with purulent drainage ABL Anemia - Hgb 5.1, pending 2 units, likely from heparin which has been stopped Post op ileus-resolved, appetite poor ID-on flagyl only IR abscess culture NGTD RUE DVT-SCD's, heparin gtt on hold due to bleeding PCM-TPN until PO is adequate.  Prealbumin is 2.7(1/16), recheck pending.  Dietitian consult, add protein supplements. FEN-reg diet as tolerated Dispo-continue inpatient.     LOS: 27 days    Nat Christen 12/18/2015, 9:02 AM Pager: [phone]

## 2015-12-18 NOTE — Progress Notes (Signed)
Nutrition Follow-up  DOCUMENTATION CODES:   Not applicable  INTERVENTION:  - Continue TPN per pharmacy - Calorie count x48 hours - Provide encouragement with meals and supplements - Continue Ensure Enlive TID, each supplement provides 350 kcal and 20 grams of protein - RD will continue to monitor for needs  NUTRITION DIAGNOSIS:   Inadequate oral intake related to inability to eat as evidenced by NPO status. -ongoing poor intakes despite diet advancement  GOAL:   Patient will meet greater than or equal to 90% of their needs -met with TPN alone  MONITOR:   Diet advancement, Weight trends, Labs, Skin, I & O's, Other (Comment) (TPN regimen)  REASON FOR ASSESSMENT:   Consult Calorie Count  ASSESSMENT:   65 year old male with a past medical history of hyperlipidemia, hypertension, CAD, type 2 diabetes, diabetic peripheral neuropathy, seizure disorder, polysubstance abuse and chronic pain issues (on high doses of oxycodone prior to admission) who was admitted 11/22/15 with a small bowel obstruction. Surgical consultation was obtained on admission with conservative therapy initially recommended. Patient's bowel obstruction was felt to be from adhesions and possible narcotic bowel. His NG tube was removed 11/23/15 and he seemed to be improving clinically but pain returned 11/24/15 with diet advancement to clears. Repeat films showed worsening small bowel dilatation so his NG tube was reinserted. TPN was subsequently ordered. On 11/27/15, the patient refused PICC line insertion for TPN and surgical intervention. PICC finally placed 11/28/15 with initiation of TPN.   1/23 Pt's diet advanced advanced as follows:  1/14: CLD 1/18: FLD 1/20: NPO 1/20: Soft 1/21: Regular  No intakes documented. Pt mumbles during discussion and is difficult to understand at times. Tech in room taking CBG and states that pt did not eat anything this AM. He denies nausea but states he is having abdominal pain and  pain in his neck.  Pt continues with Clinimix E 5/15 @ 65 mL/hr and 20% lipids @ 10 mL/hr which is meeting needs. Encouraged pt to eat at least a few small bites at each meal. Medications reviewed. Labs reviewed; CBGs: 83-258 mg/dL, Na: 133 mmol/L, Cl: 97 mmol/L, BUN: 22 mg/dL, Ca: 8.2 mg/dL.  *48 hour Calorie Count to be initiated today. RD will follow up 1/24 and 1/25 with results of this.   1/20 - Pt was previously on FLD but transitioned back to NPO this AM.  - At time of visit, pt reports very severe abdominal pain.  - Pt is POD #14 exlap and repair of small bowel perforation which resulted from SBO.  - Post-op ileus has resolved. - Pt currently receiving Clinimix E 5/15 @ 65 mL/hr with 20% lipids @ 10 mL/hr which is providing 1588 kcal (99% minimum estimated kcal needs) and 78 grams of protein. Meeting needs with TPN alone.  - Per pharmacy note this AM:  Mag Sulfate 2 grams IV x 1  Continue Clinimix E 5/15 at goal rate of 65 ml/h  Continue with 10 units of insulin / 24 hr added to bag  Continue 20% fat emulsion at 10 ml/hr  1/17 - Pt has been on CLD since 1/14 with no documented intakes.  - Pt consumed 100% of chicken broth at breakfast and all other items untouched.  - Pt reports this is the most he has consumed PO in 5 weeks.  - Pt states severe abdominal pain following intake this AM. - Encouraged pt to consume something PO at each meal, even if he is only able to consume 1 spoonful.  -  Pt states ongoing abdominal pain both with and without PO intakes. - Pt currently receiving Clinimix E 5/15 @ 65 mL/hr with 20% lipids @ 10 mL/hr which is providing 1588 kcal (99% minimum estimated kcal needs) and 78 grams of protein. - Per pharmacy note today at [phone]:  Continue Clinimix E 5/15 at goal rate of 65 ml/h  Reduce insulin from 20 units over 24hr to 10 units over 24hrs  Continue 20% fat emulsion at 10 ml/hr  1/12 - RD attempted to see patient to complete NFPE.  - Will  attempt at follow-up. - Pt continues on TPN an diet has been advanced to full liquids.  - Plan per Pharmacy:  Continue Clinimix E 5/15 at goal rate of 65 ml/h  Continue 20% fat emulsion at 10 ml/hr   Diet Order:  Diet regular Room service appropriate?: Yes; Fluid consistency:: Thin TPN (CLINIMIX-E) Adult TPN (CLINIMIX-E) Adult  Skin:  Wound (see comment) (L foot and toe DM ulcers)  Last BM:  1/21  Height:   Ht Readings from Last 1 Encounters:  12/01/15 _0  (1.88 m)    Weight:   Wt Readings from Last 1 Encounters:  12/08/15 157 lb 10.1 oz (71.5 kg)    Ideal Body Weight:  86.36 kg (kg)  BMI:  Body mass index is 20.23 kg/(m^2).  Estimated Nutritional Needs:   Kcal:  1600-1800  Protein:  65-80 grams  Fluid:  >/= 2 L/day  EDUCATION NEEDS:   No education needs identified at this time     Jarome Matin, RD, LDN Inpatient Clinical Dietitian Pager # [phone] After hours/weekend pager # [phone]

## 2015-12-19 ENCOUNTER — Telehealth: Payer: Self-pay | Admitting: *Deleted

## 2015-12-19 LAB — CBC
HCT: 20.6 % — ABNORMAL LOW (ref 39.0–52.0)
HEMOGLOBIN: 6.9 g/dL — AB (ref 13.0–17.0)
MCH: 29.5 pg (ref 26.0–34.0)
MCHC: 33.5 g/dL (ref 30.0–36.0)
MCV: 88 fL (ref 78.0–100.0)
Platelets: 269 10*3/uL (ref 150–400)
RBC: 2.34 MIL/uL — ABNORMAL LOW (ref 4.22–5.81)
RDW: 15.2 % (ref 11.5–15.5)
WBC: 9.3 10*3/uL (ref 4.0–10.5)

## 2015-12-19 LAB — BASIC METABOLIC PANEL
Anion gap: 5 (ref 5–15)
BUN: 23 mg/dL — AB (ref 6–20)
CALCIUM: 8.2 mg/dL — AB (ref 8.9–10.3)
CHLORIDE: 97 mmol/L — AB (ref 101–111)
CO2: 34 mmol/L — AB (ref 22–32)
CREATININE: 0.88 mg/dL (ref 0.61–1.24)
GFR calc Af Amer: >60 mL/min (ref >60)
GFR calc non Af Amer: >60 mL/min (ref >60)
Glucose, Bld: 198 mg/dL — ABNORMAL HIGH (ref 65–99)
Potassium: 4.6 mmol/L (ref 3.5–5.1)
SODIUM: 136 mmol/L (ref 135–145)

## 2015-12-19 LAB — HEMOGLOBIN AND HEMATOCRIT, BLOOD
HEMATOCRIT: 25.8 % — AB (ref 39.0–52.0)
Hemoglobin: 8.4 g/dL — ABNORMAL LOW (ref 13.0–17.0)

## 2015-12-19 LAB — GLUCOSE, CAPILLARY
Glucose-Capillary: 211 mg/dL — ABNORMAL HIGH (ref 65–99)
Glucose-Capillary: 166 mg/dL — ABNORMAL HIGH (ref 65–99)
Glucose-Capillary: 140 mg/dL — ABNORMAL HIGH (ref 65–99)
Glucose-Capillary: 177 mg/dL — ABNORMAL HIGH (ref 65–99)
Glucose-Capillary: 150 mg/dL — ABNORMAL HIGH (ref 65–99)

## 2015-12-19 LAB — MAGNESIUM: MAGNESIUM: 1.6 mg/dL — AB (ref 1.7–2.4)

## 2015-12-19 LAB — PREPARE RBC (CROSSMATCH)

## 2015-12-19 MED ORDER — FAT EMULSION 20 % IV EMUL: 120.0000 mL | INTRAVENOUS | Status: AC

## 2015-12-19 MED ORDER — HYDROMORPHONE HCL 2 MG/ML IJ SOLN
2.0000 mg | INTRAMUSCULAR | Status: DC | PRN
  Administered 2015-12-19 (×2): 3 mg via INTRAVENOUS
  Administered 2015-12-20: 2 mg via INTRAVENOUS
  Filled 2015-12-19 (×3): qty 2
  Filled 2015-12-19: qty 1

## 2015-12-19 MED ORDER — FAT EMULSION 20 % IV EMUL
120.0000 mL | INTRAVENOUS | Status: AC
Start: 2015-12-19 — End: 2015-12-20
  Administered 2015-12-19: 120 mL via INTRAVENOUS
  Filled 2015-12-19: qty 250

## 2015-12-19 MED ORDER — M.V.I. ADULT IV INJ
INJECTION | INTRAVENOUS | Status: AC
Start: 2015-12-19 — End: 2015-12-20
  Administered 2015-12-19: 18:00:00 via INTRAVENOUS
  Filled 2015-12-19: qty 840

## 2015-12-19 MED ORDER — MAGNESIUM SULFATE 2 GM/50ML IV SOLN
2.0000 g | Freq: Once | INTRAVENOUS | Status: AC
  Administered 2015-12-19: 2 g via INTRAVENOUS
  Filled 2015-12-19: qty 50

## 2015-12-19 MED ORDER — SODIUM CHLORIDE 0.9 % IV SOLN
Freq: Once | INTRAVENOUS | Status: AC
  Administered 2015-12-26: via INTRAVENOUS

## 2015-12-19 MED ORDER — TRACE MINERALS CR-CU-MN-SE-ZN 10-1000-500-60 MCG/ML IV SOLN: INTRAVENOUS | Status: AC

## 2015-12-19 NOTE — Progress Notes (Signed)
Calorie Count Note  48 hour calorie count ordered. Pt continues with Clinimix E 5/15 @ 65 mL/hr and 20% lipids @ 10 mL/hr.  Diet: Regular Supplements: Ensure Enlive TID  Breakfast (1/24): 50% omelet, 100% broth (~150 kcal, 12 grams protein) Dinner: 75%, unknown what was consumed during this meal Supplements: 100% Ensure Enlive 1/23 (350 kcal, 20 grams protein)  Total intake: 500 kcal (31% of minimum estimated needs) --this does not include dinner from 1/23 32 protein (49% of minimum estimated needs) --this does not include dinner from 1/23  Estimated Nutritional Needs:  Kcal: 1600-1800 Protein: 65-80 grams Fluid: >/= 2 L/day  Nutrition Dx: Inadequate oral intake related to poor (but improving) appetite as evidenced by pt report, 50-75% meal completion now.  Goal: Pt to meet >/= 90% estimated nutrition needs  Intervention:  - Continue Ensure Enlive TID - Continue Regular diet and provide encouragement during meals - Continue TPN per pharmacy and surgery plan and wean as appropriate - RD will continue to monitor for needs    Jarome Matin, RD, LDN Inpatient Clinical Dietitian Pager # [phone] After hours/weekend pager # [phone]

## 2015-12-19 NOTE — Telephone Encounter (Signed)
Spoke to patient, he has been at [facility] for about 3 weeks. He was admitted for SBO and evidently had a laparotomy by Dr. Hassell Done on 12-01-15. Patient had peritonitis / sepsis and has been recovering since. He just wanted [place] to know where he was since he missed his 3 week appt with Dr. Donnetta Hutching on 12-05-15.  His toe has been managed by the [place] and patient states that it looks better. I reassured him and told him that I would let Dr. Donnetta Hutching know his situation.

## 2015-12-19 NOTE — Progress Notes (Signed)
Patient ID: [name] [name], male   DOB: [birth year], 65 y.o.   MRN: [id number] [name] PROGRESS NOTE  [name] [name] [id number] DOB: 1951/08/23 DOA: 11/21/2015 PCP: Marijean Bravo, MD  Brief narrative:    65 year old very pleasant male with a past medical history of hyperlipidemia, hypertension, CAD, type 2 diabetes, diabetic peripheral neuropathy, seizure disorder, polysubstance abuse and chronic pain issues (on high doses of oxycodone prior to admission) who was admitted 11/22/15 with a small bowel obstruction. Surgical consultation was obtained on admission with conservative therapy initially recommended. Patient's bowel obstruction was felt to be from adhesions and possible narcotic bowel. His NG tube was removed 11/23/15 and he seemed to be improving clinically but pain returned 11/24/15 with diet advancement to clears. Repeat films showed worsening small bowel dilatation so his NG tube was reinserted. TPN was subsequently ordered. On 11/27/15, the patient refused PICC line insertion for TPN and surgical intervention. PICC finally placed 11/28/15 with initiation of TPN.   Further, patient has developed a right upper extremity DVT and currently is on IV heparin.  On 11/30/15, the patient's condition deteriorated acutely and he was found to be confused and restless. Rapid response was called. He was febrile and tachycardic with an elevated lactate level. Due to concerns for sepsis, broad-spectrum antibiotics were initiated with vancomycin and Primaxin which was switched to cefepime given his seizure history. Patient uderwent exploratory laparotomy on 12/01/15 with findings of perforated viscus with peritoneal contamination/peritonitis. He remained intubated postoperatively and was under the care of the critical care team. He self extubated 12/02/15. Surgery continues to follow, patient has an open abdominal wound. He continues to have an ileus and is on TNA at this time. [name]  assumed care 12/05/15.  Transferred to telemetry floor 12/08/2015.  Barrier to discharge: Slow improvement, ongoing pain, on TNA. Unable to predict when he will be ready for discharge but possible early next week. Chest pain overnight 1/20 with slight bump in troponin but also noted drop in Hg as well from 8.5 --> 7.7 so suspect demand ischemia from blood loss.   Assessment/Plan:    Principal problem: SBO w/ perforated viscous and resultant peritonitis / leukocytosis / sepsis / post op ileus  - S/p abdominal fluid collection drainages 1/13 with removal of RLQ drain 1/20, repositioning of LUQ drain 1/20 and placement of new right subphrenic drain 1/20 - check recent fluid cx's; cont drain irrigation; monitor output closely and rescan once minimal - other plans as per CCS  Active problems:  Chest pain 1/20 secondary to demand ischemia - mild elevation in troponin overnight 1/20 - now resolved   Hypomagnesemia - supplemented, repeat Mg level in AM  Inadequate oral intake  - In the context of acute on chronic illness - Diet advancement per Gen surgery - Continue nutritional support with TPN  Acute upper extremity DVT - US DVT right distal axillary vein, subclavian vein.  - stopped heparin drip due to drop in Hg 1/23 - continue to monitor CBC  Post operative respiratory failure with hypoxemia - Patient required ventilatory support after exploratory laparotomy 12/01/2015.  - Patient self extubated 12/02/2015 - Respiratory status has improved with Lasix 80 mg IV given 12/05/2015 - Respiratory status continues to be stable off oxygen via [place]  Acute Encephalopathy in setting of sepsis / history of seizure disorder - EEG done 11/30/2015 showed no acute seizures - Load with Keppra if seizures occur but so far no seizures at all  Acute kidney injury -  In the setting of sepsis and small bowel obstruction with perforated viscus - Creatinine normalized with hydration  Anemia of chronic  disease / thrombocytopenia - Related to history of substance and alcohol use  - Hemoglobin trending down, transfused two units PRBC 1/23, one unit 1/24 - repeat CBC in AM   Controlled, diabetes mellitus type 2 with peripheral circulatory complications without long-term insulin use - will continue to monitor CBG - Continue sliding scale insulin for now   Left lower extremity wound / S/p left fem pop bypass w/ non-healing ulcer - WOC assessment done   DVT Prophylaxis  - SCD's bilaterally in hospital   Code Status: Full.  Family Communication:  plan of care discussed with the patient, family not at the bedside this am Disposition Plan: unable to predict when pt can be discharged, slowly improving   IV access:  Central line   Procedures and diagnostic studies from 12/05/2015:  Ct Abdomen Pelvis Wo Contrast 12/07/2015   1. There is small bilateral pleural effusion right greater than left. Significant atelectasis or infiltrate in right lower lobe posteriorly. There is superimposed mild interstitial prominence bilateral lower lobes probable mild interstitial edema. 2. There is moderate perihepatic and perisplenic ascites. Moderate ascites noted bilateral paracolic gutters. Please note there is some loculation of perihepatic ascites with mild mass effect on the liver contour please see axial images 32 and 15. Peritoneal inflammation or infection cannot be excluded. 3. No hydronephrosis or hydroureter.  Stable bilateral renal cysts. 4. Postsurgical changes are noted anterior abdominal wall. Incomplete healed/open subcutaneous midline anterior abdominal wound. 5. Mild distended small bowel loops containing oral contrast material and some air-fluid levels. There is no transition point in caliber of small bowel. Findings most likely due to significant ileus or residual partial small bowel obstruction. There is some oral contrast material noted within distal colon. 6. Moderate pelvic ascites. Moderate gas  noted within mid sigmoid colon probable mild ileus. 7. Significant anasarca infiltration of subcutaneous fat abdominal and pelvic wall. 8. 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 Electronically Signed   By: Lahoma Crocker M.D.   On: 12/07/2015 15:18   Dg Chest Port 1 View 12/06/2015  Stable bilateral pulmonary edema, with right basilar subsegmental atelectasis and associated pleural effusion. Interval placement of left internal jugular catheter with distal tip in SVC. No pneumothorax is noted. Electronically Signed   By: Marijo Conception, M.D.   On: 12/06/2015 16:10   Dg Chest Port 1 View  12/05/2015   Worsening of pulmonary interstitial infiltrates bilaterally. This may reflect pulmonary edema of cardiac or noncardiac cause or could reflect pneumonia. There are small bilateral pleural effusions. Electronically Signed   By: David  Martinique M.D.   On: 12/05/2015 08:52   Exploratory laparotomy on 12/01/15   Medical Consultants:  Surgery  CCM transfer care to [facility] 1[facility]10 PCT 12/08/2015 IR  Other Consultants:  Nutrition PT  IAnti-Infectives:   Flagyl 11-30-2015 --> Cefepime 12-02-2015 --> 12/10/2015   Faye Ramsay, MD  [name] Pager [phone]   If 7PM-7AM, please contact night-coverage [email or web address] Password [email or web address] 12/19/2015, 7:28 PM   LOS: 28 days   HPI/Subjective: No new complaints reported today  Objective: Filed Vitals:   12/19/15 0633 12/19/15 0654 12/19/15 0954 12/19/15 1442  BP: 116/67 120/74 116/73 119/75  Pulse: 89 95 90 88  Temp: 98.4 F (36.9 C) 98.8 F (37.1 C) 98.3 F (36.8 C) 98.4 F (36.9 C)  TempSrc: Oral Oral Oral Oral  Resp: 20 18  18 18  Height:      Weight:      SpO2: 98% 97% 98% 99%    Intake/Output Summary (Last 24 hours) at 12/19/15 1928 Last data filed at 12/19/15 1805  Gross per 24 hour  Intake    835 ml  Output   1630 ml  Net   -795 ml    Exam:   General:  Pt looks tired and weak  Cardiovascular: Rate controlled,  (+) S1, S2 wnl  Respiratory:  No wheezing, no rhonchi  Abdomen: Has 2 JP drains, no rebound tenderness, appreciate bowel sounds  Extremities: No edema right lower extremity, has left unaboot (+)  Neuro:  No focal deficits   Data Reviewed: Basic Metabolic Panel:  Recent Labs Lab 12/14/15 0515 12/15/15 0530 12/16/15 0339 12/16/15 0800 12/17/15 0747 12/18/15 0630 12/19/15 0457  NA 137 135  --   --  136 133* 136  K 4.1 4.1  --   --  4.1 4.5 4.6  CL 103 99*  --   --  97* 97* 97*  CO2 28 28  --   --  31 30 34*  GLUCOSE 96 104*  --   --  190* 231* 198*  BUN 26* 22*  --   --  19 22* 23*  CREATININE 1.04 0.92  --   --  0.90 0.95 0.88  CALCIUM 8.3* 8.1*  --   --  8.1* 8.2* 8.2*  MG 1.5* 1.4* 1.5* 1.4* 1.5* 1.8 1.6*  PHOS 3.1 3.1 3.4  --  3.3 3.1  --    Liver Function Tests:  Recent Labs Lab 12/14/15 0515 12/18/15 0630  AST 21 21  ALT 9* 11*  ALKPHOS 101 111  BILITOT 0.5 0.4  PROT 6.2* 6.7  ALBUMIN 1.8* 1.8*   CBC:  Recent Labs Lab 12/15/15 0530 12/16/15 0339 12/17/15 0747 12/18/15 0630 12/19/15 0457  WBC 8.8 8.2 7.1 11.2* 9.3  NEUTROABS  --   --   --  7.0  --   HGB 8.5* 7.7* 7.4* 5.1* 6.9*  HCT 25.8* 23.4* 22.8* 15.6* 20.6*  MCV 86.3 86.0 84.4 86.7 88.0  PLT PLATELET CLUMPS NOTED ON SMEAR, UNABLE TO ESTIMATE 305 297 377 269   Cardiac Enzymes:  Recent Labs Lab 12/15/15 0530 12/15/15 1400 12/15/15 1948  TROPONINI <0.03 0.11* 0.13*   CBG:  Recent Labs Lab 12/18/15 1652 12/18/15 2035 12/19/15 0810 12/19/15 1143 12/19/15 1717  GLUCAP 220* 154* 211* 166* 140*    Recent Results (from the past 240 hour(s))  Culture, routine-abscess     Status: None   Collection Time: 12/15/15 11:12 AM  Result Value Ref Range Status   Specimen Description ABSCESS CT RUQ DRAIN  Final   Special Requests Normal  Final   Gram Stain   Final    ABUNDANT WBC PRESENT,BOTH PMN AND MONONUCLEAR NO SQUAMOUS EPITHELIAL CELLS SEEN NO ORGANISMS SEEN Performed at [facility]    Culture   Final    NO GROWTH 2 DAYS Performed at [facility]    Report Status 12/18/2015 FINAL  Final  Culture, routine-abscess     Status: None   Collection Time: 12/15/15  1:48 PM  Result Value Ref Range Status   Specimen Description JP DRAINAGE  Final   Special Requests NONE  Final   Gram Stain   Final    ABUNDANT WBC PRESENT,BOTH PMN AND MONONUCLEAR NO SQUAMOUS EPITHELIAL CELLS SEEN NO ORGANISMS SEEN Performed at [facility]  Culture   Final    NO GROWTH 2 DAYS Performed at [facility]    Report Status 12/18/2015 FINAL  Final    Scheduled Meds: . fentaNYL injection  50 mcg Intravenous Once  . insulin aspart  0-15 Units Subcutaneous TID WC  . metroNIDAZOLE  500 mg Oral Q6H  . thiamine  100 mg Oral Daily   Continuous Infusions: . Marland KitchenTPN (CLINIMIX-E) Adult 35 mL/hr at 12/19/15 1743   And  . fat emulsion 120 mL (12/19/15 1743)

## 2015-12-19 NOTE — Progress Notes (Addendum)
PARENTERAL NUTRITION CONSULT NOTE - FOLLOW-UP  Pharmacy Consult for TPN Indication: Bowel obstruction, post op ileus  Allergies  Allergen Reactions  . Zetia [Ezetimibe] Anaphylaxis and Swelling    Tongue and throat   . Atorvastatin Other (See Comments)    Malaise & muscle weakness  . Penicillins Other (See Comments)    Unknown.  Tolerates cefepime  . Pravastatin Sodium     Pravastatin 40 mg qday and 40 mg q M/W/F caused muscle aches  . Lisinopril Rash  . Promethazine Rash   Patient Measurements: Height: 6\' 2"  (188 cm) Weight: 157 lb 10.1 oz (71.5 kg) IBW/kg (Calculated) : 82.2 Usual Weight: 137-140 lbs (62.2-63.6 kg)  Vital Signs: Temp: 98.3 F (36.8 C) (01/24 0954) Temp Source: Oral (01/24 0954) BP: 116/73 mmHg (01/24 0954) Pulse Rate: 90 (01/24 0954) Intake/Output from previous day: 01/23 0701 - 01/24 0700 In: [id number] [P.O.:597; I.V.:270; Blood:679] Out: 1040 [Urine:900; Drains:140] Intake/Output from this shift: Total I/O In: 575 [P.O.:240; Blood:335] Out: 300 [Urine:300] Labs:  Recent Labs  12/17/15 0747 12/18/15 0630 12/19/15 0457  WBC 7.1 11.2* 9.3  HGB 7.4* 5.1* 6.9*  HCT 22.8* 15.6* 20.6*  PLT 297 377 269    Recent Labs  12/17/15 0747 12/18/15 0630 12/19/15 0457  NA 136 133* 136  K 4.1 4.5 4.6  CL 97* 97* 97*  CO2 31 30 34*  GLUCOSE 190* 231* 198*  BUN 19 22* 23*  CREATININE 0.90 0.95 0.88  CALCIUM 8.1* 8.2* 8.2*  MG 1.5* 1.8 1.6*  PHOS 3.3 3.1  --   PROT  --  6.7  --   ALBUMIN  --  1.8*  --   AST  --  21  --   ALT  --  11*  --   ALKPHOS  --  111  --   BILITOT  --  0.4  --   PREALBUMIN  --  3.0*  --   TRIG  --  118  --   Corr Ca 9.7 Estimated Creatinine Clearance: 86.9 mL/min (by C-G formula based on Cr of 0.88).    Recent Labs  12/18/15 1652 12/18/15 2035 12/19/15 0810  GLUCAP 220* 154* 211*   Medical History: Past Medical History  Diagnosis Date  . Hypertension   . CAD (coronary artery disease)   . PVD (peripheral  vascular disease) ([place])   . MI (myocardial infarction) ([place])   . HLD (hyperlipidemia)    Medications:  Infusions:  . Marland KitchenTPN (CLINIMIX-E) Adult 65 mL/hr at 12/18/15 1721   And  . fat emulsion 240 mL (12/18/15 1721)   Insulin Requirements in the past 24 hours: 23 units SSI/24h +  10 units Regular Insulin in TPN/24h  Current Nutrition:  Mechanical soft - poor appetite, denies N/V Clinimix-E 5/15 at 65 mL/hr Fat Emulsion 20% at 10 mL/hr  IVF: none  Central access: PICC placed 1/3 TPN start date: 1/3  ASSESSMENT  HPI: 32 yoM presented to ED on 12/27 with abdominal pain.  PMH includes HTN, HLD, PVD - s/p fem bypass, CAD s/p NSTEMI 2014, cardiomyopathy, DM, diabetic peripheral neuropathy, seizures, and chronic pain on chronic opioids.  Surgical history significant for open AAA repair.  CT shows partial SBO, likely adhesions.  Patient refused surgery, until 1/6 s/p repair of perforated visous.  Plan for conservative management with TPN, bowel rest.   Significant events:  1/1: TPN per pharmacy ordered - unable to start without central access.  1/3: PICC line placed; TPN started 1/5: Acute encephalopathy with fevers (102F) overnight- sepsis protocol initiated.  Broad-spectrum abx started.  1/6: Consented to laparotomy w/ findings of perforated viscus and peritonitis. Txfer to ICU   1/7:  AKI 1/10: NGT removed 1/11: started clear liquid diet 1/13 NPO again for CT guided drain placement for perihepatic fluid collection suspicious for abscess. FL diet post-procedure 1/16: per CCS, no bowel sounds noted today, patient needs to be walked as much as possible 1/18: started full liquid diet, tolerating small amounts. AKI resolved. 1/19: IR: RLQ and RUO drains removed, LUQ drain repositioned 1/22: advanced diet to mechanical soft on 1/21 1/23 drains continue to put out yellow purulent  drainage 1/24 begin wean of TPN to encourage PO appetite   Today 12/19/2015:   Glucose: uncontrolled now after starting oral diet. Amount of Novolog administered increased yesterday after scale adjusted to resistant scale with meals and at bedtime.  Hx DM on glipizide PTA.   Electrolytes - Mag 1.6. K+ WNL. Phos WNL 1/23. CorrCa 9.96.  Renal -  SCr remains WNL.   I/O record appears incomplete for intake  Drain: output 140 mL  LFTs - all below ULN 1/23  TGs -  (Hx of HPL) 124 (1/2), 164 (1/9), 95 (1/16), 118 (1/23)  Prealbumin - 3.7 (1/2), < 2 (1/9), 2.7 (1/16), 3.0 (1/23)  Ensure TID recorded as given, 48 hour calorie count by dietitian recorded as 500 kcal (31% estimated needs) and 32g of protein (49% of estimated needs) total yesterday (not including dinner)  NUTRITIONAL GOALS                                                                                             RD recs: 1600-1800 Kcal/day, 65-80 g protein/day, > 2 L fluid/day  Clinimix E 5/15 at a goal rate of 65 ml/hr + 20% fat emulsion at 10 ml/hr to provide: 78 g/day protein, 1588 Kcal/day.  PLAN                                                                                                Reduce Clinimix E 5/15 to 35 mL/hr in hopes to help stimulate the patient's appetite.  Will  reduce rate now and continue reduced rate when new bag hangs tonight at 1800.  Provide insulin in TPN so that patient receives 10 units of insulin / 24 hr.  Reduce 20% fat emulsion to 5 ml/hr  Standard MVI, MTE in TPN bag.  Continue SSI to resistant scale with meals and at bedtime.  Continue with Magnesium sulfate 2g IV x 1 as ordered by surgery.  TPN lab panels on Mondays & Thursdays. Recheck BMET, Phos, Mag in AM.  Follow up PO intake. Will discontinue TPN once patient getting at least 60% of calorie needs from oral intake.  Hershal Coria, PharmD, BCPS Pager: [phone] 12/19/2015 10:17 AM

## 2015-12-19 NOTE — Progress Notes (Signed)
CRITICAL VALUE ALERT  Critical value received:  hgb 6.9  Date of notification:  1/24  Time of notification:  0518  Critical value read back:Yes.    Nurse who received alert:  Cindi Carbon  MD notified (1st page):  Baltazar Najjar  Time of first page:  0528  MD notified (2nd page):  Time of second page:  Responding MD:  Baltazar Najjar  Time MD responded:

## 2015-12-19 NOTE — Progress Notes (Signed)
Patient ID: [name] [name], male   DOB: 29-Oct-1951, 65 y.o.   MRN: [id number]    Referring Physician(s): CCS  Chief Complaint: Intra-abdominal abscesses  Subjective: Patient not sure how he's doing.  Abdominal pain is well controlled right now.  Got pRBCs due to bleeding from midline wound. Heparin held for RUE DVT secondary to bleeding  Allergies: Zetia; Atorvastatin; Penicillins; Pravastatin sodium; Lisinopril; and Promethazine  Medications: Prior to Admission medications   Medication Sig Start Date End Date Taking? Authorizing Provider  albuterol (PROVENTIL HFA;VENTOLIN HFA) 108 (90 BASE) MCG/ACT inhaler Inhale 2 puffs into the lungs every 6 (six) hours as needed for wheezing or shortness of breath. 11/11/13  Yes Ripudeep Krystal Eaton, MD  aspirin EC 81 MG tablet Take 1 tablet (81 mg total) by mouth daily. 11/06/15  Yes Sherren Mocha, MD  collagenase (SANTYL) ointment Apply thin layer to wound bed of left foot daily Patient taking differently: Apply 1 application topically daily. Apply thin layer to wound bed of left foot daily 10/26/15  Yes Rosetta Posner, MD  gabapentin (NEURONTIN) 300 MG capsule Take 300 mg by mouth 3 (three) times daily. 09/30/15  Yes Historical Provider, MD  glipiZIDE (GLUCOTROL) 10 MG tablet Take 10 mg by mouth 2 (two) times daily before a meal.   Yes Historical Provider, MD  ketoconazole (NIZORAL) 2 % cream Apply 1 application topically daily as needed for irritation.   Yes Historical Provider, MD  Multiple Vitamins-Minerals (MULTIVITAMIN WITH MINERALS) tablet Take 4 tablets by mouth 3 (three) times daily.   Yes Historical Provider, MD  nitroGLYCERIN (NITROSTAT) 0.4 MG SL tablet Place 0.4 mg under the tongue every 5 (five) minutes as needed for chest pain.   Yes Historical Provider, MD  oxyCODONE (ROXICODONE) 15 MG immediate release tablet Take 3 tablets (45 mg total) by mouth every 6 (six) hours as needed (severe pain). 07/30/15  Yes Kimberly A Trinh, PA-C  PATADAY 0.2 %  SOLN Place 1 drop into both eyes daily.  08/11/14  Yes Historical Provider, MD  polyethylene glycol (MIRALAX / GLYCOLAX) packet Take 17 g by mouth daily as needed for mild constipation or moderate constipation.    Yes Historical Provider, MD  triamcinolone cream (KENALOG) 0.1 % Apply 1 application topically 2 (two) times daily as needed (for eczema).   Yes Historical Provider, MD  zolpidem (AMBIEN) 10 MG tablet Take 10 mg by mouth at bedtime as needed for sleep.   Yes Historical Provider, MD  silver sulfADIAZINE (SILVADENE) 1 % cream Apply 1 application topically daily. Patient not taking: Reported on 11/14/2015 07/25/15   Rosetta Posner, MD    Vital Signs: BP 116/73 mmHg  Pulse 90  Temp(Src) 98.3 F (36.8 C) (Oral)  Resp 18  Ht 6\' 2"  (1.88 m)  Wt 157 lb 10.1 oz (71.5 kg)  BMI 20.23 kg/m2  SpO2 98%  Physical Exam: Soft, midline dressing intact, LUQ drain with milky white drainage 85cc yesterday, RUQ with seropurulent drainage, 55cc yesterday.  Both sites are c/d/i with no erythema or bleeding  Imaging: Ct Image Guided Fluid Drain By Catheter  12/15/2015  CLINICAL DATA:  Small bowel perforation with multiple post-operative peritoneal fluid collections and increasing leukocytosis. Multiple percutaneous drainage catheters have previously been placed. There is a large undrained right subphrenic collection. EXAM: CT GUIDED DRAINAGE OF RIGHT SUBPHRENIC ABSCESS ANESTHESIA/SEDATION: Intravenous Fentanyl and Versed were administered as conscious sedation during continuous monitoring of the patient's level of consciousness and physiological / cardiorespiratory status by the radiology  RN, with a total moderate sedation time of 10 minutes. PROCEDURE: The procedure, risks, benefits, and alternatives were explained to the patient. Questions regarding the procedure were encouraged and answered. The patient understands and consents to the procedure. Select axial scans through the upper abdomen were obtained.  Patient was placed in left posterior oblique position an additional scans obtained. The right subphrenic collection was localized and an appropriate skin entry site was identified and marked. The operative field was prepped with Betadinein a sterile fashion, and a sterile drape was applied covering the operative field. A sterile gown and sterile gloves were used for the procedure. Local anesthesia was provided with 1% Lidocaine. Under CT fluoroscopic guidance, a 19 gauge percutaneous entry needle was advanced into the inferior aspect of the collection. Thin somewhat cloudy pale yellow fluid could be aspirated. A guidewire advanced easily within the collection, its position confirmed on CT fluoro. Tract was dilated to facilitate placement of a 12 French pigtail catheter, formed centrally in the subphrenic component of the collection. 10 mL of opaque yellowish thin fluid were aspirated sent for Gram stain, culture and sensitivity. Catheter secured externally with 0 Prolene suture and StatLock and placed to gravity drain bag. The patient tolerated the procedure well. COMPLICATIONS: None immediate FINDINGS: Limited CT demonstrates some interval increase in bilateral pleural effusions since prior scan of 12/14/2015. Center venous catheter to the distal SVC. Patchy coronary calcifications. There is a complex gas and fluid collection extending from anterior to the right hepatic lobe into the right subhepatic space. There is no contains some contrast material since yesterday's catheter injection. The left upper quadrant drain catheter has been redirected into the subphrenic collection which has significantly resolved since the previous day's exam. Percutaneous 12 French pigtail drain catheter placed into the right subphrenic collection as detailed above. IMPRESSION: 1. Technically successful CT-guided right subphrenic abscess drain catheter placement. Electronically Signed   By: Lucrezia Europe M.D.   On: 12/15/2015 14:57     Labs:  CBC:  Recent Labs  12/16/15 0339 12/17/15 0747 12/18/15 0630 12/19/15 0457  WBC 8.2 7.1 11.2* 9.3  HGB 7.7* 7.4* 5.1* 6.9*  HCT 23.4* 22.8* 15.6* 20.6*  PLT 305 297 377 269    COAGS:  Recent Labs  07/26/15 1415 12/06/15 0925 12/08/15 0340  INR 1.09 1.89* 1.82*  APTT 37 45*  --     BMP:  Recent Labs  12/15/15 0530 12/17/15 0747 12/18/15 0630 12/19/15 0457  NA 135 136 133* 136  K 4.1 4.1 4.5 4.6  CL 99* 97* 97* 97*  CO2 28 31 30  34*  GLUCOSE 104* 190* 231* 198*  BUN 22* 19 22* 23*  CALCIUM 8.1* 8.1* 8.2* 8.2*  CREATININE 0.92 0.90 0.95 0.88  GFRNONAA >60 >60 >60 >60  GFRAA >60 >60 >60 >60    LIVER FUNCTION TESTS:  Recent Labs  12/07/15 0445 12/11/15 0518 12/14/15 0515 12/18/15 0630  BILITOT 0.9 0.9 0.5 0.4  AST 27 21 21 21   ALT 14* 11* 9* 11*  ALKPHOS 72 74 101 111  PROT 5.3* 5.8* 6.2* 6.7  ALBUMIN 1.8* 1.9* 1.8* 1.8*    Assessment and Plan: 1. S/p abdominal fluid collection drainages 1/13 with removal of RLQ drain 1/20, repositioning of LUQ drain 1/20 and placement of new right subphrenic drain 1/20 - recent cxs are NGTD -CCS has planned for repeat CT scan on Thursday 1/26 to reevaluate these fluid collections -cont with drain irrigations Electronically Signed: Torell Minder E 12/19/2015, 10:00 AM  I spent a total of 15 Minutes at the the patient's bedside AND on the patient's hospital floor or unit, greater than 50% of which was counseling/coordinating care for intra-abdominal abscesses, s/p perc drainage

## 2015-12-19 NOTE — Progress Notes (Signed)
[facility] Progress Note  18 Days Post-Op  Subjective: Pt feels much better, less pain.  No more bleeding from wound.  It is dry today.  Tolerating diet, but appetite still diminished.  Mobilizing OOB.  Thinks he's depressed from the pain meds and knows he's addicted.  He is agreeable to slowly reduce dosage.  Having BM's and much better flatus.  No N/V.    Objective: Vital signs in last 24 hours: Temp:  [98.4 F (36.9 C)-100.5 F (38.1 C)] 98.8 F (37.1 C) (01/24 0654) Pulse Rate:  [89-102] 95 (01/24 0654) Resp:  [18-20] 18 (01/24 0654) BP: (103-120)/(55-79) 120/74 mmHg (01/24 0654) SpO2:  [91 %-100 %] 97 % (01/24 0654) Last BM Date: 12/17/15  Intake/Output from previous day: 01/23 0701 - 01/24 0700 In: 1546 [P.O.:597; I.V.:270; Blood:679] Out: 1040 [Urine:900; Drains:140] Intake/Output this shift: Total I/O In: 240 [P.O.:240] Out: 300 [Urine:300]  PE: Gen:  Alert, NAD, pleasant Abd: Soft, NT/ND, +BS, no HSM, incisions C/D/I, gravity bag with more seropurulent drainage, bulb drain with seropurulent drainage Ext:  RUE quite edematous, rest of extremities with more minor swelling  Lab Results:   Recent Labs  12/18/15 0630 12/19/15 0457  WBC 11.2* 9.3  HGB 5.1* 6.9*  HCT 15.6* 20.6*  PLT 377 269   BMET  Recent Labs  12/18/15 0630 12/19/15 0457  NA 133* 136  K 4.5 4.6  CL 97* 97*  CO2 30 34*  GLUCOSE 231* 198*  BUN 22* 23*  CREATININE 0.95 0.88  CALCIUM 8.2* 8.2*   PT/INR No results for input(s): LABPROT, INR in the last 72 hours. CMP     Component Value Date/Time   NA 136 12/19/2015 0457   K 4.6 12/19/2015 0457   CL 97* 12/19/2015 0457   CO2 34* 12/19/2015 0457   GLUCOSE 198* 12/19/2015 0457   BUN 23* 12/19/2015 0457   CREATININE 0.88 12/19/2015 0457   CALCIUM 8.2* 12/19/2015 0457   PROT 6.7 12/18/2015 0630   ALBUMIN 1.8* 12/18/2015 0630   AST 21 12/18/2015 0630   ALT 11* 12/18/2015 0630   ALKPHOS 111 12/18/2015 0630   BILITOT  0.4 12/18/2015 0630   GFRNONAA >60 12/19/2015 0457   GFRAA >60 12/19/2015 0457   Lipase     Component Value Date/Time   LIPASE 21 11/21/2015 1621       Studies/Results: No results found.  Anti-infectives: Anti-infectives    Start     Dose/Rate Route Frequency Ordered Stop   12/15/15 0800  metroNIDAZOLE (FLAGYL) tablet 500 mg     500 mg Oral Every 6 hours 12/15/15 0714     12/08/15 0800  metroNIDAZOLE (FLAGYL) IVPB 500 mg  Status:  Discontinued     500 mg 100 mL/hr over 60 Minutes Intravenous Every 6 hours 12/08/15 0737 12/15/15 0714   12/03/15 1800  vancomycin (VANCOCIN) IVPB 750 mg/150 ml premix  Status:  Discontinued     750 mg 150 mL/hr over 60 Minutes Intravenous Every 24 hours 12/03/15 1715 12/04/15 0911   12/02/15 1800  ceFEPIme (MAXIPIME) 1 g in dextrose 5 % 50 mL IVPB     1 g 100 mL/hr over 30 Minutes Intravenous Every 12 hours 12/02/15 1014 12/09/15 1759   12/02/15 0600  vancomycin (VANCOCIN) IVPB 1000 mg/200 mL premix  Status:  Discontinued     1,000 mg 200 mL/hr over 60 Minutes Intravenous 3 times per day 12/02/15 0547 12/02/15 0954   12/01/15 2200  ceFEPIme (MAXIPIME) 1 g in dextrose 5 %  50 mL IVPB  Status:  Discontinued     1 g 100 mL/hr over 30 Minutes Intravenous 3 times per day 12/01/15 1455 12/02/15 1014   11/30/15 1400  ceFEPIme (MAXIPIME) 1 g in dextrose 5 % 50 mL IVPB  Status:  Discontinued     1 g 100 mL/hr over 30 Minutes Intravenous 3 times per day 11/30/15 0913 12/01/15 1455   11/30/15 1000  metroNIDAZOLE (FLAGYL) IVPB 500 mg     500 mg 100 mL/hr over 60 Minutes Intravenous Every 8 hours 11/30/15 0858 12/07/15 0226   11/30/15 0645  vancomycin (VANCOCIN) IVPB 750 mg/150 ml premix  Status:  Discontinued     750 mg 150 mL/hr over 60 Minutes Intravenous 3 times per day 11/30/15 0630 12/02/15 0547   11/30/15 0645  imipenem-cilastatin (PRIMAXIN) 500 mg in sodium chloride 0.9 % 100 mL IVPB  Status:  Discontinued     500 mg 200 mL/hr over 30 Minutes  Intravenous 3 times per day 11/30/15 0630 11/30/15 0859       Assessment/Plan POD #18 exlap, and repair of SB perforation in setting of SBO--Dr. Hassell Done 12/01/15 -Pulmonary toilet, mobilize  -BID wet to dry dressing changes to open wound/retention sutures -Mobilze  -Incisional bleeding improved, cont. dressing changes -Robaxin, no Toradol due to bleeding -Start to wean IV narcotics (q4h prn today) Post op intra-abdominal abscesses -RUQ drain placement 1/20 Dr. Vernard Gambles with seropurulent drainage -LUQ bulb drain in place with seropurulent drainage ABL Anemia - Hgb 6.9, 2 units on 1/23, 1 unit on 1/24 2* heparin Post op ileus-resolved, appetite poor ID-on flagyl only, WBC noraml at 9.3, IR abscess culture NGTD RUE DVT-SCD's, heparin gtt on hold due to bleeding, pending filter per primary PCM- reduce to 1/2 dose TPN until PO is adequate. Prealbumin is 3.0 (1/23). Dietitian consult, add protein supplements. FEN-reg diet as tolerated, mg low - supplemented Dispo-continue inpatient. Repeat CT scan when drain output less than 30-42mL currently 24mL & 16mL/24hr, tentatively for Thursday if improved    LOS: 28 days    [name] [name] 12/19/2015, 9:19 AM Pager: [phone]

## 2015-12-19 NOTE — Progress Notes (Signed)
PT Cancellation Note  Patient Details Name: MESHAL BREUNINGER MRN: [id number] DOB: 12-19-1950   Cancelled Treatment:    Reason Eval/Treat Not Completed: Medical issues which prohibited therapy (more blood given )   Claretha Cooper 12/19/2015, 5:41 PM Tresa Endo PT [phone]

## 2015-12-19 NOTE — Telephone Encounter (Signed)
-----   Message from Gena Fray sent at 12/19/2015  1:00 PM EST ----- Regarding: ? about treatment Can someone please call Mr Drinkard. He left a brief message on the appointment line stating that he needed Dr Donnetta Hutching and a miracle. He has questions about treatment plan. He is currently admitted at [facility].  He left the # [phone]  Thank you! Hinton Dyer

## 2015-12-20 DIAGNOSIS — R1011 Right upper quadrant pain: Secondary | ICD-10-CM

## 2015-12-20 LAB — BASIC METABOLIC PANEL
ANION GAP: 6 (ref 5–15)
BUN: 22 mg/dL — ABNORMAL HIGH (ref 6–20)
CO2: 33 mmol/L — ABNORMAL HIGH (ref 22–32)
Calcium: 8.3 mg/dL — ABNORMAL LOW (ref 8.9–10.3)
Chloride: 96 mmol/L — ABNORMAL LOW (ref 101–111)
Creatinine, Ser: 0.86 mg/dL (ref 0.61–1.24)
GFR calc Af Amer: >60 mL/min (ref >60)
GLUCOSE: 139 mg/dL — AB (ref 65–99)
POTASSIUM: 4.7 mmol/L (ref 3.5–5.1)
SODIUM: 135 mmol/L (ref 135–145)

## 2015-12-20 LAB — CBC
HCT: 25 % — ABNORMAL LOW (ref 39.0–52.0)
HEMOGLOBIN: 8.2 g/dL — AB (ref 13.0–17.0)
MCH: 30.3 pg (ref 26.0–34.0)
MCHC: 32.8 g/dL (ref 30.0–36.0)
MCV: 92.3 fL (ref 78.0–100.0)
PLATELETS: 283 10*3/uL (ref 150–400)
RBC: 2.71 MIL/uL — AB (ref 4.22–5.81)
RDW: 16.1 % — ABNORMAL HIGH (ref 11.5–15.5)
WBC: 10.6 10*3/uL — AB (ref 4.0–10.5)

## 2015-12-20 LAB — TYPE AND SCREEN
ABO/RH(D): A NEG
ANTIBODY SCREEN: NEGATIVE
UNIT DIVISION: 0
Unit division: 0
Unit division: 0

## 2015-12-20 LAB — GLUCOSE, CAPILLARY
GLUCOSE-CAPILLARY: 125 mg/dL — AB (ref 65–99)
GLUCOSE-CAPILLARY: 149 mg/dL — AB (ref 65–99)
GLUCOSE-CAPILLARY: 179 mg/dL — AB (ref 65–99)
Glucose-Capillary: 188 mg/dL — ABNORMAL HIGH (ref 65–99)

## 2015-12-20 LAB — MAGNESIUM: MAGNESIUM: 1.5 mg/dL — AB (ref 1.7–2.4)

## 2015-12-20 LAB — PHOSPHORUS: Phosphorus: 3.3 mg/dL (ref 2.5–4.6)

## 2015-12-20 MED ORDER — MAGNESIUM SULFATE 2 GM/50ML IV SOLN
2.0000 g | Freq: Once | INTRAVENOUS | Status: AC
  Administered 2015-12-20: 2 g via INTRAVENOUS
  Filled 2015-12-20: qty 50

## 2015-12-20 MED ORDER — FAT EMULSION 20 % IV EMUL
120.0000 mL | INTRAVENOUS | Status: AC
Start: 2015-12-20 — End: 2015-12-21
  Administered 2015-12-20: 120 mL via INTRAVENOUS
  Filled 2015-12-20: qty 250

## 2015-12-20 MED ORDER — HYDROMORPHONE HCL 2 MG/ML IJ SOLN
2.0000 mg | Freq: Four times a day (QID) | INTRAMUSCULAR | Status: DC | PRN
  Administered 2015-12-20 – 2015-12-21 (×3): 3 mg via INTRAVENOUS
  Administered 2015-12-21: 2 mg via INTRAVENOUS
  Filled 2015-12-20: qty 1
  Filled 2015-12-20 (×3): qty 2

## 2015-12-20 MED ORDER — OXYCODONE HCL 5 MG PO TABS
20.0000 mg | ORAL_TABLET | ORAL | Status: DC | PRN
  Administered 2015-12-20 – 2015-12-30 (×30): 20 mg via ORAL
  Filled 2015-12-20 (×34): qty 4

## 2015-12-20 MED ORDER — TRACE MINERALS CR-CU-MN-SE-ZN 10-1000-500-60 MCG/ML IV SOLN
INTRAVENOUS | Status: AC
  Administered 2015-12-20: 17:00:00 via INTRAVENOUS
  Filled 2015-12-20: qty 840

## 2015-12-20 NOTE — Progress Notes (Signed)
Nutrition Follow-up  DOCUMENTATION CODES:   Not applicable  INTERVENTION:  - Continue Ensure Enlive TID - Continue TPN/TPN weaning per pharmacy - RD will continue to monitor for needs  NUTRITION DIAGNOSIS:   Inadequate oral intake related to inability to eat as evidenced by NPO status. -resolving with current intakes  GOAL:   Patient will meet greater than or equal to 90% of their needs -not met on average with PO intakes and current TPN regimen  MONITOR:   PO intake, Weight trends, Labs, Skin, I & O's, Other (Comment) (TPN regimen)  ASSESSMENT:   65 year old male with a past medical history of hyperlipidemia, hypertension, CAD, type 2 diabetes, diabetic peripheral neuropathy, seizure disorder, polysubstance abuse and chronic pain issues (on high doses of oxycodone prior to admission) who was admitted 11/22/15 with a small bowel obstruction. Surgical consultation was obtained on admission with conservative therapy initially recommended. Patient's bowel obstruction was felt to be from adhesions and possible narcotic bowel. His NG tube was removed 11/23/15 and he seemed to be improving clinically but pain returned 11/24/15 with diet advancement to clears. Repeat films showed worsening small bowel dilatation so his NG tube was reinserted. TPN was subsequently ordered. On 11/27/15, the patient refused PICC line insertion for TPN and surgical intervention. PICC finally placed 11/28/15 with initiation of TPN.   Calorie Count Note  48 hour calorie count ordered.  Diet: Ensure Regular Supplements: Ensure Enlive TID ( each supplement provides 350 kcal, 20 grams of protein)  Breakfast (1/25): no intake % recorded for items Lunch (1/24): pt refused this meal Dinner: 75% green beans, 50% grilled chicken breast, 25% cooked carrots (~120 kcal, 14.75 grams protein) Supplements: no intakes of supplements provided      TPN regimen changed yesterday AM: Clinimix E 5/15 @ 35 mL/hr with 20% lipids  @ 5 mL/hr which provides 836 kcal, 42 grams protein.  Per pharmacy note this AM, plan to continue this regimen at this time.  Pt is currently receiving ~956 kcal and ~57 grams of protein from PO intake and TPN regimen. This meets 60% minimum estimated kcal and 88% minimum estimated protein needs.   Pt likely not fully meeting needs at this time. Medications reviewed. Labs reviewed; CBGs: 125-220 mg/dL, Cl: 96 mmol/L, BUN: 22 mg/dL, Ca: 8.3 mg/dL, Mg: 1.5 mg/dL.   Diet Order:  Diet regular Room service appropriate?: Yes; Fluid consistency:: Thin TPN (CLINIMIX-E) Adult TPN (CLINIMIX-E) Adult  Skin:  Wound (see comment) (L foot and toe DM ulcers)  Last BM:  1/22  Height:   Ht Readings from Last 1 Encounters:  12/01/15 _0  (1.88 m)    Weight:   Wt Readings from Last 1 Encounters:  12/08/15 157 lb 10.1 oz (71.5 kg)    Ideal Body Weight:  86.36 kg (kg)  BMI:  Body mass index is 20.23 kg/(m^2).  Estimated Nutritional Needs:   Kcal:  1600-1800  Protein:  65-80 grams  Fluid:  >/= 2 L/day  EDUCATION NEEDS:   No education needs identified at this time     Jarome Matin, RD, LDN Inpatient Clinical Dietitian Pager # [phone] After hours/weekend pager # [phone]

## 2015-12-20 NOTE — Progress Notes (Signed)
Patient ID: [name] [name], male   DOB: 12-30-1950, 65 y.o.   MRN: [id number] [name] PROGRESS NOTE  TEMITAYO PARKHOUSE [id number] DOB: 06-Sep-1951 DOA: 11/21/2015 PCP: Marijean Bravo, MD  Brief narrative:    65 year old very pleasant male with a past medical history of hyperlipidemia, hypertension, CAD, type 2 diabetes, diabetic peripheral neuropathy, seizure disorder, polysubstance abuse and chronic pain issues (on high doses of oxycodone prior to admission) who was admitted 11/22/15 with a small bowel obstruction. Surgical consultation was obtained on admission with conservative therapy initially recommended. Patient's bowel obstruction was felt to be from adhesions and possible narcotic bowel. His NG tube was removed 11/23/15 and he seemed to be improving clinically but pain returned 11/24/15 with diet advancement to clears. Repeat films showed worsening small bowel dilatation so his NG tube was reinserted. TPN was subsequently ordered. On 11/27/15, the patient refused PICC line insertion for TPN and surgical intervention. PICC finally placed 11/28/15 with initiation of TPN.   Further, patient has developed a right upper extremity DVT and currently is on IV heparin.  On 11/30/15, the patient's condition deteriorated acutely and he was found to be confused and restless. Rapid response was called. He was febrile and tachycardic with an elevated lactate level. Due to concerns for sepsis, broad-spectrum antibiotics were initiated with vancomycin and Primaxin which was switched to cefepime given his seizure history. Patient uderwent exploratory laparotomy on 12/01/15 with findings of perforated viscus with peritoneal contamination/peritonitis. He remained intubated postoperatively and was under the care of the critical care team. He self extubated 12/02/15. Surgery continues to follow, patient has an open abdominal wound. He continues to have an ileus and is on TNA at this time. [name]  assumed care 12/05/15.  Transferred to telemetry floor 12/08/2015.  Barrier to discharge: Slow improvement, ongoing pain, on TNA. Unable to predict when he will be ready for discharge but possible early next week. Chest pain overnight 1/20 with slight bump in troponin but also noted drop in Hg as well from 8.5 --> 7.7 so suspect demand ischemia from blood loss.   Assessment/Plan:    Principal problem: SBO w/ perforated viscous and resultant peritonitis / leukocytosis / sepsis / post op ileus  - S/p abdominal fluid collection drainages 1/13 with removal of RLQ drain 1/20, repositioning of LUQ drain 1/20 and placement of new right subphrenic drain 1/20 - check recent fluid cx's; cont drain irrigation; monitor output closely and rescan once minimal - plan to repeat CT abd in AM, possibly remove drain later this week  - other plans as per CCS  Active problems:  Chest pain 1/20 secondary to demand ischemia - mild elevation in troponin overnight 1/20 - now resolved   Hypomagnesemia - supplement and repeat Mg level in AM  Inadequate oral intake  - In the context of acute on chronic illness - Diet advancement per Gen surgery - tolerating some oral diet this AM - Continue nutritional support with TPN  Acute upper extremity DVT - US DVT right distal axillary vein, subclavian vein.  - stopped heparin drip due to drop in Hg 1/23 - continue to monitor CBC  Post operative respiratory failure with hypoxemia - Patient required ventilatory support after exploratory laparotomy 12/01/2015.  - Patient self extubated 12/02/2015 - Respiratory status has improved with Lasix 80 mg IV given 12/05/2015 - Respiratory status continues to be stable off oxygen via [place]  Acute Encephalopathy in setting of sepsis / history of seizure disorder - EEG done 11/30/2015  showed no acute seizures - Load with Keppra if seizures occur but so far no seizures at all  Acute kidney injury - In the setting of sepsis and  small bowel obstruction with perforated viscus - Creatinine normalized with hydration  Anemia of chronic disease / thrombocytopenia - Related to history of substance and alcohol use  - transfused two units PRBC 1/23, one unit 1/24 - Hg remains stable for now  - repeat CBC in AM   Controlled, diabetes mellitus type 2 with peripheral circulatory complications without long-term insulin use - will continue to monitor CBG - Continue sliding scale insulin for now   Left lower extremity wound / S/p left fem pop bypass w/ non-healing ulcer - WOC assessment done   DVT Prophylaxis  - SCD's bilaterally in hospital   Code Status: Full.  Family Communication:  plan of care discussed with the patient, family not at the bedside this am Disposition Plan: unable to predict when pt can be discharged, slowly improving   IV access:  Central line   Procedures and diagnostic studies from 12/05/2015:  Ct Abdomen Pelvis Wo Contrast 12/07/2015   1. There is small bilateral pleural effusion right greater than left. Significant atelectasis or infiltrate in right lower lobe posteriorly. There is superimposed mild interstitial prominence bilateral lower lobes probable mild interstitial edema. 2. There is moderate perihepatic and perisplenic ascites. Moderate ascites noted bilateral paracolic gutters. Please note there is some loculation of perihepatic ascites with mild mass effect on the liver contour please see axial images 32 and 15. Peritoneal inflammation or infection cannot be excluded. 3. No hydronephrosis or hydroureter.  Stable bilateral renal cysts. 4. Postsurgical changes are noted anterior abdominal wall. Incomplete healed/open subcutaneous midline anterior abdominal wound. 5. Mild distended small bowel loops containing oral contrast material and some air-fluid levels. There is no transition point in caliber of small bowel. Findings most likely due to significant ileus or residual partial small bowel  obstruction. There is some oral contrast material noted within distal colon. 6. Moderate pelvic ascites. Moderate gas noted within mid sigmoid colon probable mild ileus. 7. Significant anasarca infiltration of subcutaneous fat abdominal and pelvic wall. 8. 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 Electronically Signed   By: Lahoma Crocker M.D.   On: 12/07/2015 15:18   Dg Chest Port 1 View 12/06/2015  Stable bilateral pulmonary edema, with right basilar subsegmental atelectasis and associated pleural effusion. Interval placement of left internal jugular catheter with distal tip in SVC. No pneumothorax is noted. Electronically Signed   By: Marijo Conception, M.D.   On: 12/06/2015 16:10   Dg Chest Port 1 View  12/05/2015   Worsening of pulmonary interstitial infiltrates bilaterally. This may reflect pulmonary edema of cardiac or noncardiac cause or could reflect pneumonia. There are small bilateral pleural effusions. Electronically Signed   By: David  Martinique M.D.   On: 12/05/2015 08:52   Exploratory laparotomy on 12/01/15   Medical Consultants:  Surgery  CCM transfer care to [facility] 1-10 PCT 12/08/2015 IR  Other Consultants:  Nutrition PT  IAnti-Infectives:   Flagyl 11-30-2015 --> Cefepime 12-02-2015 --> 12/10/2015   Faye Ramsay, MD  [name] Pager [phone]   If 7PM-7AM, please contact night-coverage [email or web address] Password [email or web address] 12/20/2015, 2:00 PM   LOS: 29 days   HPI/Subjective: No new complaints reported today  Objective: Filed Vitals:   12/19/15 0954 12/19/15 1442 12/19/15 2148 12/20/15 0540  BP: 116/73 119/75 130/88 133/86  Pulse: 90 88 94 90  Temp: 98.3 F (36.8 C) 98.4 F (36.9 C) 99.2 F (37.3 C) 98.9 F (37.2 C)  TempSrc: Oral Oral Oral Oral  Resp: 18 18 17 17   Height:      Weight:      SpO2: 98% 99% 100% 97%    Intake/Output Summary (Last 24 hours) at 12/20/15 1400 Last data filed at 12/20/15 0900  Gross per 24 hour  Intake    380 ml   Output    890 ml  Net   -510 ml    Exam:   General:  Pt looks tired and weak  Cardiovascular: Rate controlled, (+) S1, S2 wnl  Respiratory:  No wheezing, no rhonchi  Abdomen: Has 2 JP drains, no rebound tenderness, appreciate bowel sounds  Extremities: No edema right lower extremity, has left unaboot (+)  Neuro:  No focal deficits   Data Reviewed: Basic Metabolic Panel:  Recent Labs Lab 12/15/15 0530 12/16/15 0339 12/16/15 0800 12/17/15 0747 12/18/15 0630 12/19/15 0457 12/20/15 0440  NA 135  --   --  136 133* 136 135  K 4.1  --   --  4.1 4.5 4.6 4.7  CL 99*  --   --  97* 97* 97* 96*  CO2 28  --   --  31 30 34* 33*  GLUCOSE 104*  --   --  190* 231* 198* 139*  BUN 22*  --   --  19 22* 23* 22*  CREATININE 0.92  --   --  0.90 0.95 0.88 0.86  CALCIUM 8.1*  --   --  8.1* 8.2* 8.2* 8.3*  MG 1.4* 1.5* 1.4* 1.5* 1.8 1.6* 1.5*  PHOS 3.1 3.4  --  3.3 3.1  --  3.3   Liver Function Tests:  Recent Labs Lab 12/14/15 0515 12/18/15 0630  AST 21 21  ALT 9* 11*  ALKPHOS 101 111  BILITOT 0.5 0.4  PROT 6.2* 6.7  ALBUMIN 1.8* 1.8*   CBC:  Recent Labs Lab 12/16/15 0339 12/17/15 0747 12/18/15 0630 12/19/15 0457 12/19/15 1900 12/20/15 0440  WBC 8.2 7.1 11.2* 9.3  --  10.6*  NEUTROABS  --   --  7.0  --   --   --   HGB 7.7* 7.4* 5.1* 6.9* 8.4* 8.2*  HCT 23.4* 22.8* 15.6* 20.6* 25.8* 25.0*  MCV 86.0 84.4 86.7 88.0  --  92.3  PLT 305 297 377 269  --  283   Cardiac Enzymes:  Recent Labs Lab 12/15/15 0530 12/15/15 1400 12/15/15 1948  TROPONINI <0.03 0.11* 0.13*   CBG:  Recent Labs Lab 12/19/15 1717 12/19/15 2145 12/19/15 2325 12/20/15 0737 12/20/15 1204  GLUCAP 140* 177* 150* 125* 149*    Recent Results (from the past 240 hour(s))  Culture, routine-abscess     Status: None   Collection Time: 12/15/15 11:12 AM  Result Value Ref Range Status   Specimen Description ABSCESS CT RUQ DRAIN  Final   Special Requests Normal  Final   Gram Stain   Final     ABUNDANT WBC PRESENT,BOTH PMN AND MONONUCLEAR NO SQUAMOUS EPITHELIAL CELLS SEEN NO ORGANISMS SEEN Performed at [facility]    Culture   Final    NO GROWTH 2 DAYS Performed at [facility]    Report Status 12/18/2015 FINAL  Final  Culture, routine-abscess     Status: None   Collection Time: 12/15/15  1:48 PM  Result Value Ref Range Status   Specimen Description JP DRAINAGE  Final  Special Requests NONE  Final   Gram Stain   Final    ABUNDANT WBC PRESENT,BOTH PMN AND MONONUCLEAR NO SQUAMOUS EPITHELIAL CELLS SEEN NO ORGANISMS SEEN Performed at [facility]    Culture   Final    NO GROWTH 2 DAYS Performed at [facility]    Report Status 12/18/2015 FINAL  Final    Scheduled Meds: . fentaNYL injection  50 mcg Intravenous Once  . insulin aspart  0-15 Units Subcutaneous TID WC  . metroNIDAZOLE  500 mg Oral Q6H  . thiamine  100 mg Oral Daily   Continuous Infusions: . Marland KitchenTPN (CLINIMIX-E) Adult 35 mL/hr at 12/19/15 1743   And  . fat emulsion 120 mL (12/19/15 1743)  . Marland KitchenTPN (CLINIMIX-E) Adult     And  . fat emulsion

## 2015-12-20 NOTE — Progress Notes (Signed)
PARENTERAL NUTRITION CONSULT NOTE - FOLLOW-UP  Pharmacy Consult for TPN Indication: Bowel obstruction, post op ileus  Allergies  Allergen Reactions  . Zetia [Ezetimibe] Anaphylaxis and Swelling    Tongue and throat   . Atorvastatin Other (See Comments)    Malaise & muscle weakness  . Penicillins Other (See Comments)    Unknown.  Tolerates cefepime  . Pravastatin Sodium     Pravastatin 40 mg qday and 40 mg q M/W/F caused muscle aches  . Lisinopril Rash  . Promethazine Rash   Patient Measurements: Height: 6\' 2"  (188 cm) Weight: 157 lb 10.1 oz (71.5 kg) IBW/kg (Calculated) : 82.2 Usual Weight: 137-140 lbs (62.2-63.6 kg)  Vital Signs: Temp: 98.9 F (37.2 C) (01/25 0540) Temp Source: Oral (01/25 0540) BP: 133/86 mmHg (01/25 0540) Pulse Rate: 90 (01/25 0540) Intake/Output from previous day: 01/24 0701 - 01/25 0700 In: 965 [P.O.:600; Blood:335] Out: 1390 [Urine:1200; Drains:190] Intake/Output from this shift: Total I/O In: 130 [P.O.:120; Other:10] Out: 490 [Urine:400; Drains:90] Labs:  Recent Labs  12/18/15 0630 12/19/15 0457 12/19/15 1900 12/20/15 0440  WBC 11.2* 9.3  --  10.6*  HGB 5.1* 6.9* 8.4* 8.2*  HCT 15.6* 20.6* 25.8* 25.0*  PLT 377 269  --  283    Recent Labs  12/17/15 0747 12/18/15 0630 12/19/15 0457 12/20/15 0440  NA 136 133* 136 135  K 4.1 4.5 4.6 4.7  CL 97* 97* 97* 96*  CO2 31 30 34* 33*  GLUCOSE 190* 231* 198* 139*  BUN 19 22* 23* 22*  CREATININE 0.90 0.95 0.88 0.86  CALCIUM 8.1* 8.2* 8.2* 8.3*  MG 1.5* 1.8 1.6* 1.5*  PHOS 3.3 3.1  --  3.3  PROT  --  6.7  --   --   ALBUMIN  --  1.8*  --   --   AST  --  21  --   --   ALT  --  11*  --   --   ALKPHOS  --  111  --   --   BILITOT  --  0.4  --   --   PREALBUMIN  --  3.0*  --   --   TRIG  --  118  --   --   Corr Ca 10.1 Estimated Creatinine Clearance: 88.9 mL/min (by C-G formula based on Cr of 0.86).    Recent Labs  12/19/15 1717 12/19/15 2145 12/19/15 2325  GLUCAP 140* 177* 150*    Medical History: Past Medical History  Diagnosis Date  . Hypertension   . CAD (coronary artery disease)   . PVD (peripheral vascular disease) ([place])   . MI (myocardial infarction) ([place])   . HLD (hyperlipidemia)    Medications:  Infusions:  . Marland KitchenTPN (CLINIMIX-E) Adult 35 mL/hr at 12/19/15 1743   And  . fat emulsion 120 mL (12/19/15 1743)   Insulin Requirements in the past 24 hours: 14 units SSI/24h +  10 units Regular Insulin in TPN/24h  Current Nutrition:  Mechanical soft  Clinimix-E 5/15 at 35 mL/hr Fat Emulsion 20% at 5 mL/hr  IVF: none  Central access: PICC placed 1/3 TPN start date: 1/3  ASSESSMENT  HPI: 28 yoM presented to ED on 12/27 with abdominal pain.  PMH includes HTN, HLD, PVD - s/p fem bypass, CAD s/p NSTEMI 2014, cardiomyopathy, DM, diabetic peripheral neuropathy, seizures, and chronic pain on chronic opioids.  Surgical history significant for open AAA repair.  CT shows partial SBO, likely adhesions.  Patient refused surgery, until 1/6 s/p repair of perforated visous.  Plan for conservative management with TPN, bowel rest.   Significant events:  1/1: TPN per pharmacy ordered - unable to start without central access.  1/3: PICC line placed; TPN started 1/5: Acute encephalopathy with fevers (102F) overnight- sepsis protocol initiated.  Broad-spectrum abx started.  1/6: Consented to laparotomy w/ findings of perforated viscus and peritonitis. Txfer to ICU   1/7:  AKI 1/10: NGT removed 1/11: started clear liquid diet 1/13 NPO again for CT guided drain placement for perihepatic fluid collection suspicious for abscess. FL diet post-procedure 1/16: per CCS, no bowel sounds noted today, patient needs to be walked as much as possible 1/18: started full liquid diet, tolerating small amounts. AKI resolved. 1/19: IR: RLQ and RUO drains removed, LUQ drain  repositioned 1/22: advanced diet to regular on 1/21 1/23 drains continue to put out yellow purulent drainage 1/24 begin wean of TPN to encourage PO appetite   Today 12/20/2015:   Glucose: control improved after TPN rate weaned by 1/2 on 1/24.  Hx DM on glipizide PTA.   Electrolytes - Mg low.  K, Na, Phos all WNL.  Renal -  SCr remains WNL.   I/O record appears incomplete for intake  Drain: output 190 mL  LFTs - all below ULN 1/23  TGs -  (Hx of HPL) 124 (1/2), 164 (1/9), 95 (1/16), 118 (1/23)  Prealbumin - 3.7 (1/2), < 2 (1/9), 2.7 (1/16), 3.0 (1/23)  Ensure TID - two doses recorded as given yesterday. charted as eating 0% lunch, 50% dinner yesterday; RD following.  NUTRITIONAL GOALS                                                                                             RD recs: 1600-1800 Kcal/day, 65-80 g protein/day, > 2 L fluid/day  Clinimix E 5/15 at a goal rate of 65 ml/hr + 20% fat emulsion at 10 ml/hr provides: 78 g/day protein, 1588 Kcal/day.  PLAN                                                                                                Continue Clinimix E 5/15 and 20% Fat Emulsion, but at reduced rates (35 mL/hr and 5 mL/hr respectively) in hopes of helping stimulate appetite.  Provide insulin in TPN so that patient receives 10 units of insulin / 24 hr.  Standard MVI, MTE in  TPN bag.  Continue SSI to resistant scale with meals and at bedtime.  Magnesium sulfate 2g IV x 1 today.  TPN lab panels on Mondays & Thursdays - due tomorrow.  Follow up PO intake. Will discontinue TPN once patient consistently getting at least 60% of calorie needs from oral intake.  Clayburn Pert, PharmD, BCPS Pager: [phone] 12/20/2015  6:34 AM

## 2015-12-20 NOTE — Progress Notes (Signed)
Physical Therapy Treatment Patient Details Name: [name] [name] MRN: [id number] DOB: [birth year] Today's Date: 12/20/2015    History of Present Illness 65 year old aam who was admitted on 12/27 w/ abd pain found to be 2/2 SBO on CT scan. Marland Kitchen His hospital course was complicated by fever spike and delirium on 1/5. At that time has abd was more distended.  He went for exploratory lap 12/01/15 which was notable for significant peritoneal contamination. He is s/p exploration, washout and repair of perforated viscous    PT Comments    The patient is unable to progress  To standing due to profound weakness of knee extension and hip flexion and adduction. The ankle strength is much improved with dorsi and plantar flexion 4+/5 on R and 4/5 On L. Also note diminished to absent knee jerk bilaterally. Appears  Impaired sensation although patient did not fully cooperate with testing. Patient is limited to lateral transfers only from bed to chair.  Patient was ambulatory PTA. Would a neurology consult be indicated?  Follow Up Recommendations  SNF;Supervision/Assistance - 24 hour     Equipment Recommendations  Wheelchair (measurements PT) cushion  Recommendations for Other Services       Precautions / Restrictions Precautions Precautions: Fall Precaution Comments: profound LE weakness, abd  drains and from back on R    Mobility  Bed Mobility   Bed Mobility: Supine to Sit     Supine to sit: Mod assist     General bed mobility comments: assisted with the legs to edge of the bed, no conrtol to absuct and  extend the knees, will flop over the edge.  Transfers Overall transfer level: Needs assistance   Transfers: Lateral/Scoot Transfers          Lateral/Scoot Transfers: Min assist General transfer comment: with surfaces even and recliner has a drop arm, able to scoot across into recliner with max use of upper body. no ability to use legs to push.   Ambulation/Gait                  Stairs            Wheelchair Mobility    Modified Rankin (Stroke Patients Only)       Balance   Sitting-balance support: Feet supported;Bilateral upper extremity supported Sitting balance-Leahy Scale: Good Sitting balance - Comments: shifts weight to sides and backward and pulls up.                            Cognition Arousal/Alertness: Awake/alert Behavior During Therapy: WFL for tasks assessed/performed                        Exercises General Exercises - Lower Extremity Long Arc Quad: PROM;Both;10 reps;Seated Heel Slides: AAROM Hip ABduction/ADduction: AAROM;Seated Hip Flexion/Marching: PROM;Both;10 reps;Seated    General Comments        Pertinent Vitals/Pain Faces Pain Scale: Hurts whole lot Pain Location: abdomenn Pain Intervention(s): Limited activity within patient's tolerance;Patient requesting pain meds-RN notified    Home Living                      Prior Function            PT Goals (current goals can now be found in the care plan section) Acute Rehab PT Goals Patient Stated Goal: to walk again Time For Goal Achievement: 01/03/16 Potential to Achieve Goals: Fair Progress  towards PT goals: Goals downgraded-see care plan    Frequency  Min 3X/week    PT Plan Current plan remains appropriate    Co-evaluation             End of Session   Activity Tolerance: Patient tolerated treatment well Patient left: in chair;with call bell/phone within reach;with chair alarm set     Time: 1415-1445 PT Time Calculation (min) (ACUTE ONLY): 30 min  Charges:  $Therapeutic Activity: 23-37 mins                    G Codes:      Claretha Cooper 12/20/2015, 3:54 PM Tresa Endo PT [phone]

## 2015-12-20 NOTE — Progress Notes (Signed)
Patient ID: [name] [name], male   DOB: 09/05/1951, 65 y.o.   MRN: [id number]    Referring Physician(s): CCS  Chief Complaint: Intra-abdominal abscesses  Subjective: Patient trying to eat lunch.   LLQ drain with 90cc/24hrs RLQ drain with 100cc/24hrs  Allergies: Zetia; Atorvastatin; Penicillins; Pravastatin sodium; Lisinopril; and Promethazine  Medications: Prior to Admission medications   Medication Sig Start Date End Date Taking? Authorizing Provider  albuterol (PROVENTIL HFA;VENTOLIN HFA) 108 (90 BASE) MCG/ACT inhaler Inhale 2 puffs into the lungs every 6 (six) hours as needed for wheezing or shortness of breath. 11/11/13  Yes Ripudeep Krystal Eaton, MD  aspirin EC 81 MG tablet Take 1 tablet (81 mg total) by mouth daily. 11/06/15  Yes Sherren Mocha, MD  collagenase (SANTYL) ointment Apply thin layer to wound bed of left foot daily Patient taking differently: Apply 1 application topically daily. Apply thin layer to wound bed of left foot daily 10/26/15  Yes Rosetta Posner, MD  gabapentin (NEURONTIN) 300 MG capsule Take 300 mg by mouth 3 (three) times daily. 09/30/15  Yes Historical Provider, MD  glipiZIDE (GLUCOTROL) 10 MG tablet Take 10 mg by mouth 2 (two) times daily before a meal.   Yes Historical Provider, MD  ketoconazole (NIZORAL) 2 % cream Apply 1 application topically daily as needed for irritation.   Yes Historical Provider, MD  Multiple Vitamins-Minerals (MULTIVITAMIN WITH MINERALS) tablet Take 4 tablets by mouth 3 (three) times daily.   Yes Historical Provider, MD  nitroGLYCERIN (NITROSTAT) 0.4 MG SL tablet Place 0.4 mg under the tongue every 5 (five) minutes as needed for chest pain.   Yes Historical Provider, MD  oxyCODONE (ROXICODONE) 15 MG immediate release tablet Take 3 tablets (45 mg total) by mouth every 6 (six) hours as needed (severe pain). 07/30/15  Yes Kimberly A Trinh, PA-C  PATADAY 0.2 % SOLN Place 1 drop into both eyes daily.  08/11/14  Yes Historical Provider, MD    polyethylene glycol (MIRALAX / GLYCOLAX) packet Take 17 g by mouth daily as needed for mild constipation or moderate constipation.    Yes Historical Provider, MD  triamcinolone cream (KENALOG) 0.1 % Apply 1 application topically 2 (two) times daily as needed (for eczema).   Yes Historical Provider, MD  zolpidem (AMBIEN) 10 MG tablet Take 10 mg by mouth at bedtime as needed for sleep.   Yes Historical Provider, MD  silver sulfADIAZINE (SILVADENE) 1 % cream Apply 1 application topically daily. Patient not taking: Reported on 11/14/2015 07/25/15   Rosetta Posner, MD    Vital Signs: BP 133/86 mmHg  Pulse 90  Temp(Src) 98.9 F (37.2 C) (Oral)  Resp 17  Ht 6\' 2"  (1.88 m)  Wt 157 lb 10.1 oz (71.5 kg)  BMI 20.23 kg/m2  SpO2 97%  Physical Exam: Abd: soft, midline wound dressed, no acute evidence of further bleeding.  LLQ drain with cloudy serous drainage, same as RLQ drain.  Drain site is c/d/i, no erythema or bleeding  Imaging: No results found.  Labs:  CBC:  Recent Labs  12/17/15 0747 12/18/15 0630 12/19/15 0457 12/19/15 1900 12/20/15 0440  WBC 7.1 11.2* 9.3  --  10.6*  HGB 7.4* 5.1* 6.9* 8.4* 8.2*  HCT 22.8* 15.6* 20.6* 25.8* 25.0*  PLT 297 377 269  --  283    COAGS:  Recent Labs  07/26/15 1415 12/06/15 0925 12/08/15 0340  INR 1.09 1.89* 1.82*  APTT 37 45*  --     BMP:  Recent Labs  12/17/15  [id number] 12/18/15 0630 12/19/15 0457 12/20/15 0440  NA 136 133* 136 135  K 4.1 4.5 4.6 4.7  CL 97* 97* 97* 96*  CO2 31 30 34* 33*  GLUCOSE 190* 231* 198* 139*  BUN 19 22* 23* 22*  CALCIUM 8.1* 8.2* 8.2* 8.3*  CREATININE 0.90 0.95 0.88 0.86  GFRNONAA >60 >60 >60 >60  GFRAA >60 >60 >60 >60    LIVER FUNCTION TESTS:  Recent Labs  12/07/15 0445 12/11/15 0518 12/14/15 0515 12/18/15 0630  BILITOT 0.9 0.9 0.5 0.4  AST 27 21 21 21   ALT 14* 11* 9* 11*  ALKPHOS 72 74 101 111  PROT 5.3* 5.8* 6.2* 6.7  ALBUMIN 1.8* 1.9* 1.8* 1.8*    Assessment and Plan: 1. S/p  abdominal fluid collection drainages 1/13 with removal of RLQ drain 1/20, repositioning of LUQ drain 1/20 and placement of new right subphrenic drain 1/20  -cont both drains and irrigation -plan for repeat CT scan tomorrow per CCS -review scans after completion  Electronically Signed: Ernesto Lashway E 12/20/2015, 1:42 PM   I spent a total of 15 Minutes at the the patient's bedside AND on the patient's hospital floor or unit, greater than 50% of which was counseling/coordinating care for intra-abdominal abscess and drainage management

## 2015-12-20 NOTE — Progress Notes (Signed)
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  He tells me he takes 30mg  of oxy every 4 hours as needed for pain.  We looked him up on the [place] database and he got 272 15mg  oxy pills a month from his IM doctor [facility] in [place].  Says his appetite is mildly improved.  He's drinking the ensures.    Objective: Vital signs in last 24 hours: Temp:  [98.4 F (36.9 C)-99.2 F (37.3 C)] 98.9 F (37.2 C) (01/25 0540) Pulse Rate:  [88-94] 90 (01/25 0540) Resp:  [17-18] 17 (01/25 0540) BP: (119-133)/(75-88) 133/86 mmHg (01/25 0540) SpO2:  [97 %-100 %] 97 % (01/25 0540) Last BM Date: 12/17/15  Intake/Output from previous day: 01/24 0701 - 01/25 0700 In: 975 [P.O.:600; Blood:335] Out: 1390 [Urine:1200; Drains:190] Intake/Output this shift:    PE: Gen:  Alert, NAD, pleasant Abd: Soft, NT/ND, +BS, no HSM, incisions good granulation tissue, retention sutures in place, gravity bag with more seropurulent drainage, bulb drain with seropurulent drainage Ext: RUE quite edematous, rest of extremities with more minor swelling   Lab Results:   Recent Labs  12/19/15 0457 12/19/15 1900 12/20/15 0440  WBC 9.3  --  10.6*  HGB 6.9* 8.4* 8.2*  HCT 20.6* 25.8* 25.0*  PLT 269  --  283   BMET  Recent Labs  12/19/15 0457 12/20/15 0440  NA 136 135  K 4.6 4.7  CL 97* 96*  CO2 34* 33*  GLUCOSE 198* 139*  BUN 23* 22*  CREATININE 0.88 0.86  CALCIUM 8.2* 8.3*   PT/INR No results for input(s): LABPROT, INR in the last 72 hours. CMP     Component Value Date/Time   NA 135 12/20/2015 0440   K 4.7 12/20/2015 0440   CL 96* 12/20/2015 0440   CO2 33* 12/20/2015 0440   GLUCOSE 139* 12/20/2015 0440   BUN 22* 12/20/2015 0440   CREATININE 0.86 12/20/2015 0440   CALCIUM 8.3* 12/20/2015 0440   PROT 6.7 12/18/2015 0630   ALBUMIN 1.8* 12/18/2015 0630   AST 21 12/18/2015 0630   ALT 11* 12/18/2015 0630   ALKPHOS 111  12/18/2015 0630   BILITOT 0.4 12/18/2015 0630   GFRNONAA >60 12/20/2015 0440   GFRAA >60 12/20/2015 0440   Lipase     Component Value Date/Time   LIPASE 21 11/21/2015 1621       Studies/Results: No results found.  Anti-infectives: Anti-infectives    Start     Dose/Rate Route Frequency Ordered Stop   12/15/15 0800  metroNIDAZOLE (FLAGYL) tablet 500 mg     500 mg Oral Every 6 hours 12/15/15 0714     12/08/15 0800  metroNIDAZOLE (FLAGYL) IVPB 500 mg  Status:  Discontinued     500 mg 100 mL/hr over 60 Minutes Intravenous Every 6 hours 12/08/15 0737 12/15/15 0714   12/03/15 1800  vancomycin (VANCOCIN) IVPB 750 mg/150 ml premix  Status:  Discontinued     750 mg 150 mL/hr over 60 Minutes Intravenous Every 24 hours 12/03/15 1715 12/04/15 0911   12/02/15 1800  ceFEPIme (MAXIPIME) 1 g in dextrose 5 % 50 mL IVPB     1 g 100 mL/hr over 30 Minutes Intravenous Every 12 hours 12/02/15 1014 12/09/15 1759   12/02/15 0600  vancomycin (VANCOCIN) IVPB 1000 mg/200 mL premix  Status:  Discontinued     1,000 mg 200 mL/hr over 60 Minutes Intravenous 3 times per  day 12/02/15 0547 12/02/15 0954   12/01/15 2200  ceFEPIme (MAXIPIME) 1 g in dextrose 5 % 50 mL IVPB  Status:  Discontinued     1 g 100 mL/hr over 30 Minutes Intravenous 3 times per day 12/01/15 1455 12/02/15 1014   11/30/15 1400  ceFEPIme (MAXIPIME) 1 g in dextrose 5 % 50 mL IVPB  Status:  Discontinued     1 g 100 mL/hr over 30 Minutes Intravenous 3 times per day 11/30/15 0913 12/01/15 1455   11/30/15 1000  metroNIDAZOLE (FLAGYL) IVPB 500 mg     500 mg 100 mL/hr over 60 Minutes Intravenous Every 8 hours 11/30/15 0858 12/07/15 0226   11/30/15 0645  vancomycin (VANCOCIN) IVPB 750 mg/150 ml premix  Status:  Discontinued     750 mg 150 mL/hr over 60 Minutes Intravenous 3 times per day 11/30/15 0630 12/02/15 0547   11/30/15 0645  imipenem-cilastatin (PRIMAXIN) 500 mg in sodium chloride 0.9 % 100 mL IVPB  Status:  Discontinued     500 mg 200  mL/hr over 30 Minutes Intravenous 3 times per day 11/30/15 0630 11/30/15 0859       Assessment/Plan POD #19 exlap, and repair of SB perforation in setting of SBO--Dr. Hassell Done 12/01/15 -Pulmonary toilet, mobilize  -BID wet to dry dressing changes to open wound/retention sutures -Mobilze  -Incisional bleeding improved, cont. dressing changes -Robaxin, no Toradol due to bleeding -Start to wean IV narcotics (q6h prn today), will increase his Oxy from 10-20mg , I don't feel comfortable increasing it to 30mg  (his reported home dose) at this point Post op intra-abdominal abscesses -RUQ drain placement 1/20 Dr. Vernard Gambles with seropurulent drainage -LUQ bulb drain in place with seropurulent drainage ABL Anemia - Hgb 8.2, 2 units on 1/23, 1 unit on 1/24, 2* heparin Post op ileus-resolved, appetite poor ID-d/c flagyl (I'm not sure why the patient was only on flagyl) and observe off antibiotics, WBC 10.6, IR abscess culture NGTD final RUE DVT-SCD's, heparin gtt on hold due to bleeding, pending filter per primary PCM- reduce to 1/2 dose TPN until PO is adequate. Prealbumin is 3.0 (1/23). Dietitian consult, protein supplements. FEN-reg diet as tolerated, mg low - supplemented Dispo-continue inpatient. Repeat CT scan when drain output less than 30-55mL currently 29mL & 179mL/24hr    LOS: 29 days    Nat Christen 12/20/2015, 10:04 AM Pager: [phone]

## 2015-12-21 LAB — COMPREHENSIVE METABOLIC PANEL
ALT: 12 U/L — ABNORMAL LOW (ref 17–63)
ANION GAP: 4 — AB (ref 5–15)
AST: 23 U/L (ref 15–41)
Albumin: 1.8 g/dL — ABNORMAL LOW (ref 3.5–5.0)
Alkaline Phosphatase: 112 U/L (ref 38–126)
BILIRUBIN TOTAL: 0.7 mg/dL (ref 0.3–1.2)
BUN: 18 mg/dL (ref 6–20)
CHLORIDE: 95 mmol/L — AB (ref 101–111)
CO2: 34 mmol/L — ABNORMAL HIGH (ref 22–32)
Calcium: 8 mg/dL — ABNORMAL LOW (ref 8.9–10.3)
Creatinine, Ser: 0.73 mg/dL (ref 0.61–1.24)
GFR calc Af Amer: >60 mL/min (ref >60)
Glucose, Bld: 85 mg/dL (ref 65–99)
POTASSIUM: 4.5 mmol/L (ref 3.5–5.1)
Sodium: 133 mmol/L — ABNORMAL LOW (ref 135–145)
TOTAL PROTEIN: 6.2 g/dL — AB (ref 6.5–8.1)

## 2015-12-21 LAB — CBC
HCT: 26.6 % — ABNORMAL LOW (ref 39.0–52.0)
Hemoglobin: 8.4 g/dL — ABNORMAL LOW (ref 13.0–17.0)
MCH: 30.1 pg (ref 26.0–34.0)
MCHC: 31.6 g/dL (ref 30.0–36.0)
MCV: 95.3 fL (ref 78.0–100.0)
PLATELETS: ADEQUATE 10*3/uL (ref 150–400)
RBC: 2.79 MIL/uL — AB (ref 4.22–5.81)
RDW: 17.1 % — ABNORMAL HIGH (ref 11.5–15.5)
WBC: 11.5 10*3/uL — AB (ref 4.0–10.5)

## 2015-12-21 LAB — GLUCOSE, CAPILLARY
GLUCOSE-CAPILLARY: 114 mg/dL — AB (ref 65–99)
GLUCOSE-CAPILLARY: 167 mg/dL — AB (ref 65–99)
Glucose-Capillary: 104 mg/dL — ABNORMAL HIGH (ref 65–99)
Glucose-Capillary: 139 mg/dL — ABNORMAL HIGH (ref 65–99)

## 2015-12-21 LAB — PHOSPHORUS: PHOSPHORUS: 3.4 mg/dL (ref 2.5–4.6)

## 2015-12-21 LAB — MAGNESIUM: MAGNESIUM: 1.5 mg/dL — AB (ref 1.7–2.4)

## 2015-12-21 MED ORDER — MAGNESIUM SULFATE 4 GM/100ML IV SOLN
4.0000 g | Freq: Once | INTRAVENOUS | Status: AC
  Administered 2015-12-21: 4 g via INTRAVENOUS
  Filled 2015-12-21: qty 100

## 2015-12-21 MED ORDER — TRACE MINERALS CR-CU-MN-SE-ZN 10-1000-500-60 MCG/ML IV SOLN
INTRAVENOUS | Status: AC
  Administered 2015-12-21: 18:00:00 via INTRAVENOUS
  Filled 2015-12-21: qty 840

## 2015-12-21 MED ORDER — FAT EMULSION 20 % IV EMUL
120.0000 mL | INTRAVENOUS | Status: AC
  Administered 2015-12-21: 120 mL via INTRAVENOUS
  Filled 2015-12-21: qty 250

## 2015-12-21 MED ORDER — HYDROMORPHONE HCL 1 MG/ML IJ SOLN
1.0000 mg | Freq: Four times a day (QID) | INTRAMUSCULAR | Status: DC | PRN
  Administered 2015-12-21 – 2015-12-22 (×3): 2 mg via INTRAVENOUS
  Administered 2015-12-22 (×2): 1 mg via INTRAVENOUS
  Administered 2015-12-22: 2 mg via INTRAVENOUS
  Administered 2015-12-22: 1 mg via INTRAVENOUS
  Administered 2015-12-23 – 2015-12-25 (×8): 2 mg via INTRAVENOUS
  Filled 2015-12-21 (×8): qty 2
  Filled 2015-12-21: qty 1
  Filled 2015-12-21 (×4): qty 2
  Filled 2015-12-21: qty 1
  Filled 2015-12-21: qty 2

## 2015-12-21 NOTE — Progress Notes (Signed)
Patient ID: [name] [name], male   DOB: 07-21-1951, 65 y.o.   MRN: [id number]     [facility]      [address] [place], [place] [address]    Phone: [phone] FAX: [phone]     Subjective: No n/v.  Hungry.  Afebrile.   LUQ drain 62m/24h RUQ drain 745m24h.  Objective:  Vital signs:  Filed Vitals:   12/20/15 0540 12/20/15 1359 12/20/15 2147 12/21/15 0545  BP: 133/86 119/81 116/70 134/78  Pulse: 90 90 89 95  Temp: 98.9 F (37.2 C) 99.4 F (37.4 C) 98.1 F (36.7 C) 99.3 F (37.4 C)  TempSrc: Oral Oral Oral Oral  Resp: 17 17 17 18   Height:      Weight:      SpO2: 97% 98% 98% 99%    Last BM Date: 12/20/15  Intake/Output   Yesterday:  01/25 0701 - 01/26 0700 In: 740 [P.O.:720] Out: 1760 [Urine:1300; Drains:160; Stool:300] This shift:   Physical Exam: General: Pt awake/alert/oriented x4 in no acute distress Abdomen: Soft.  Nondistended.  Mildly tender at incisions only. Midline incision c/d/i.  RUQ drain with serous output.  LUQ JP drain with purulent output.   No evidence of peritonitis.  No incarcerated hernias.    Problem List:   Principal Problem:   Small bowel obstruction & perforation s/p OR exploration & repair 12/01/2015 Active Problems:   Protein-calorie malnutrition, severe (HCC)   Purulent peritonitis ([facility]  Underweight   Acute respiratory failure with hypoxemia (HCC)   DVT of axillary vein, acute right   Palliative care encounter   Acute encephalopathy   Seizures (HCC)   Acute kidney injury ([place]  Controlled diabetes mellitus with circulatory complication, without long-term current use of insulin (HCC)   Anemia of chronic disease   Sepsis (HCC)   Leukocytosis   Thrombocytopenia (HCC)   Pressure ulcer   Abdominal wall fluid collections   Abscess   Hypomagnesemia   Demand ischemia on 1/20 from acute blood loss anemia (HCC)   Abdominal pain    Results:   Labs: Results for orders  placed or performed during the hospital encounter of 11/21/15 (from the past 48 hour(s))  Glucose, capillary     Status: Abnormal   Collection Time: 12/19/15  8:10 AM  Result Value Ref Range   Glucose-Capillary 211 (H) 65 - 99 mg/dL  Glucose, capillary     Status: Abnormal   Collection Time: 12/19/15 11:43 AM  Result Value Ref Range   Glucose-Capillary 166 (H) 65 - 99 mg/dL  Glucose, capillary     Status: Abnormal   Collection Time: 12/19/15  5:17 PM  Result Value Ref Range   Glucose-Capillary 140 (H) 65 - 99 mg/dL  Hemoglobin and hematocrit, blood     Status: Abnormal   Collection Time: 12/19/15  7:00 PM  Result Value Ref Range   Hemoglobin 8.4 (L) 13.0 - 17.0 g/dL   HCT 25.8 (L) 39.0 - 52.0 %  Glucose, capillary     Status: Abnormal   Collection Time: 12/19/15  9:45 PM  Result Value Ref Range   Glucose-Capillary 177 (H) 65 - 99 mg/dL  Glucose, capillary     Status: Abnormal   Collection Time: 12/19/15 11:25 PM  Result Value Ref Range   Glucose-Capillary 150 (H) 65 - 99 mg/dL  CBC     Status: Abnormal   Collection Time: 12/20/15  4:40 AM  Result Value Ref Range  WBC 10.6 (H) 4.0 - 10.5 K/uL   RBC 2.71 (L) 4.22 - 5.81 MIL/uL   Hemoglobin 8.2 (L) 13.0 - 17.0 g/dL   HCT 25.0 (L) 39.0 - 52.0 %   MCV 92.3 78.0 - 100.0 fL   MCH 30.3 26.0 - 34.0 pg   MCHC 32.8 30.0 - 36.0 g/dL   RDW 16.1 (H) 11.5 - 15.5 %   Platelets 283 150 - 400 K/uL  Basic metabolic panel     Status: Abnormal   Collection Time: 12/20/15  4:40 AM  Result Value Ref Range   Sodium 135 135 - 145 mmol/L   Potassium 4.7 3.5 - 5.1 mmol/L   Chloride 96 (L) 101 - 111 mmol/L   CO2 33 (H) 22 - 32 mmol/L   Glucose, Bld 139 (H) 65 - 99 mg/dL   BUN 22 (H) 6 - 20 mg/dL   Creatinine, Ser 0.86 0.61 - 1.24 mg/dL   Calcium 8.3 (L) 8.9 - 10.3 mg/dL   GFR calc non Af Amer >60 >60 mL/min   GFR calc Af Amer >60 >60 mL/min    Comment: (NOTE) The eGFR has been calculated using the CKD EPI equation. This calculation has not  been validated in all clinical situations. eGFR's persistently <60 mL/min signify possible Chronic Kidney Disease.    Anion gap 6 5 - 15  Magnesium     Status: Abnormal   Collection Time: 12/20/15  4:40 AM  Result Value Ref Range   Magnesium 1.5 (L) 1.7 - 2.4 mg/dL  Phosphorus     Status: None   Collection Time: 12/20/15  4:40 AM  Result Value Ref Range   Phosphorus 3.3 2.5 - 4.6 mg/dL  Glucose, capillary     Status: Abnormal   Collection Time: 12/20/15  7:37 AM  Result Value Ref Range   Glucose-Capillary 125 (H) 65 - 99 mg/dL  Glucose, capillary     Status: Abnormal   Collection Time: 12/20/15 12:04 PM  Result Value Ref Range   Glucose-Capillary 149 (H) 65 - 99 mg/dL  Glucose, capillary     Status: Abnormal   Collection Time: 12/20/15  5:44 PM  Result Value Ref Range   Glucose-Capillary 179 (H) 65 - 99 mg/dL  Glucose, capillary     Status: Abnormal   Collection Time: 12/20/15 10:04 PM  Result Value Ref Range   Glucose-Capillary 188 (H) 65 - 99 mg/dL  Comprehensive metabolic panel     Status: Abnormal   Collection Time: 12/21/15  4:35 AM  Result Value Ref Range   Sodium 133 (L) 135 - 145 mmol/L   Potassium 4.5 3.5 - 5.1 mmol/L   Chloride 95 (L) 101 - 111 mmol/L   CO2 34 (H) 22 - 32 mmol/L   Glucose, Bld 85 65 - 99 mg/dL   BUN 18 6 - 20 mg/dL   Creatinine, Ser 0.73 0.61 - 1.24 mg/dL   Calcium 8.0 (L) 8.9 - 10.3 mg/dL   Total Protein 6.2 (L) 6.5 - 8.1 g/dL   Albumin 1.8 (L) 3.5 - 5.0 g/dL   AST 23 15 - 41 U/L   ALT 12 (L) 17 - 63 U/L   Alkaline Phosphatase 112 38 - 126 U/L   Total Bilirubin 0.7 0.3 - 1.2 mg/dL   GFR calc non Af Amer >60 >60 mL/min   GFR calc Af Amer >60 >60 mL/min    Comment: (NOTE) The eGFR has been calculated using the CKD EPI equation. This calculation has not been validated  in all clinical situations. eGFR's persistently <60 mL/min signify possible Chronic Kidney Disease.    Anion gap 4 (L) 5 - 15  Magnesium     Status: Abnormal   Collection  Time: 12/21/15  4:35 AM  Result Value Ref Range   Magnesium 1.5 (L) 1.7 - 2.4 mg/dL  Phosphorus     Status: None   Collection Time: 12/21/15  4:35 AM  Result Value Ref Range   Phosphorus 3.4 2.5 - 4.6 mg/dL  CBC     Status: Abnormal   Collection Time: 12/21/15  4:35 AM  Result Value Ref Range   WBC 11.5 (H) 4.0 - 10.5 K/uL    Comment: WHITE COUNT CONFIRMED ON SMEAR   RBC 2.79 (L) 4.22 - 5.81 MIL/uL   Hemoglobin 8.4 (L) 13.0 - 17.0 g/dL   HCT 26.6 (L) 39.0 - 52.0 %   MCV 95.3 78.0 - 100.0 fL   MCH 30.1 26.0 - 34.0 pg   MCHC 31.6 30.0 - 36.0 g/dL   RDW 17.1 (H) 11.5 - 15.5 %   Platelets  150 - 400 K/uL    PLATELET CLUMPS NOTED ON SMEAR, COUNT APPEARS ADEQUATE    Imaging / Studies: No results found.  Medications / Allergies:  Scheduled Meds: . sodium chloride   Intravenous Once  . antiseptic oral rinse  7 mL Mouth Rinse q12n4p  . bacitracin   Topical Daily  . chlorhexidine  15 mL Mouth Rinse BID  . collagenase  1 application Topical Daily  . feeding supplement (ENSURE ENLIVE)  237 mL Oral TID BM  . fentaNYL (SUBLIMAZE) injection  50 mcg Intravenous Once  . insulin aspart  0-20 Units Subcutaneous TID AC & HS  . lip balm  1 application Topical BID  . methocarbamol  1,000 mg Oral TID  . sodium chloride  3 mL Intravenous Q12H  . thiamine  100 mg Oral Daily   Continuous Infusions: . Marland KitchenTPN (CLINIMIX-E) Adult 35 mL/hr at 12/20/15 1716   And  . fat emulsion 120 mL (12/20/15 1718)   PRN Meds:.acetaminophen, albuterol, alum & mag hydroxide-simeth, bisacodyl, diphenhydrAMINE, hydrALAZINE, HYDROmorphone (DILAUDID) injection, magic mouthwash, menthol-cetylpyridinium, ondansetron (ZOFRAN) IV, oxyCODONE, phenol, sodium chloride, sodium chloride, sodium chloride, triamcinolone cream  Antibiotics: Anti-infectives    Start     Dose/Rate Route Frequency Ordered Stop   12/15/15 0800  metroNIDAZOLE (FLAGYL) tablet 500 mg  Status:  Discontinued     500 mg Oral Every 6 hours 12/15/15 0714  12/20/15 1055   12/08/15 0800  metroNIDAZOLE (FLAGYL) IVPB 500 mg  Status:  Discontinued     500 mg 100 mL/hr over 60 Minutes Intravenous Every 6 hours 12/08/15 0737 12/15/15 0714   12/03/15 1800  vancomycin (VANCOCIN) IVPB 750 mg/150 ml premix  Status:  Discontinued     750 mg 150 mL/hr over 60 Minutes Intravenous Every 24 hours 12/03/15 1715 12/04/15 0911   12/02/15 1800  ceFEPIme (MAXIPIME) 1 g in dextrose 5 % 50 mL IVPB     1 g 100 mL/hr over 30 Minutes Intravenous Every 12 hours 12/02/15 1014 12/09/15 1759   12/02/15 0600  vancomycin (VANCOCIN) IVPB 1000 mg/200 mL premix  Status:  Discontinued     1,000 mg 200 mL/hr over 60 Minutes Intravenous 3 times per day 12/02/15 0547 12/02/15 0954   12/01/15 2200  ceFEPIme (MAXIPIME) 1 g in dextrose 5 % 50 mL IVPB  Status:  Discontinued     1 g 100 mL/hr over 30 Minutes Intravenous 3 times  per day 12/01/15 1455 12/02/15 1014   11/30/15 1400  ceFEPIme (MAXIPIME) 1 g in dextrose 5 % 50 mL IVPB  Status:  Discontinued     1 g 100 mL/hr over 30 Minutes Intravenous 3 times per day 11/30/15 0913 12/01/15 1455   11/30/15 1000  metroNIDAZOLE (FLAGYL) IVPB 500 mg     500 mg 100 mL/hr over 60 Minutes Intravenous Every 8 hours 11/30/15 0858 12/07/15 0226   11/30/15 0645  vancomycin (VANCOCIN) IVPB 750 mg/150 ml premix  Status:  Discontinued     750 mg 150 mL/hr over 60 Minutes Intravenous 3 times per day 11/30/15 0630 12/02/15 0547   11/30/15 0645  imipenem-cilastatin (PRIMAXIN) 500 mg in sodium chloride 0.9 % 100 mL IVPB  Status:  Discontinued     500 mg 200 mL/hr over 30 Minutes Intravenous 3 times per day 11/30/15 0630 11/30/15 0859       Assessment/Plan POD #20 exlap, and repair of SB perforation in setting of SBO--Dr. Hassell Done 12/01/15 -Pulmonary toilet, mobilize  -BID wet to dry dressing changes to open wound/retention sutures -Robaxin, no Toradol due to bleeding -PRN oxyIR, wean dilaudid IV(per [place] narcotic database gets 4m oxyIR tablets #270  per month) Post op intra-abdominal abscesses -RUQ drain placement 1/20 Dr. HVernard Gambleswith seroust drainage(931m -LUQ bulb drain in place with purulent drainage(7020m-think it's still to early to repeat CT scan, but will recheck ABL Anemia - Hgb 8.4, 2 units on 1/23, 1 unit on 1/24 Post op ileus-resolved, appetite poor ID-IR abscess culture NGTD final RUE DVT-SCD's, heparin gtt on hold due to bleeding, pending filter per primary PCM-continue 1/2 dose TPN until calorie count is complete.. Prealbumin is 3.0 (1/23). Dietitian consult, protein supplements. FEN-reg diet as tolerated, cal count Dispo-continue inpatient.  [facility] Pager [phone](7A-4:30P)   12/21/2015 7:47 AM

## 2015-12-21 NOTE — Progress Notes (Addendum)
PARENTERAL NUTRITION CONSULT NOTE - FOLLOW-UP  Pharmacy Consult for TPN Indication: Bowel obstruction, post op ileus  Allergies  Allergen Reactions  . Zetia [Ezetimibe] Anaphylaxis and Swelling    Tongue and throat   . Atorvastatin Other (See Comments)    Malaise & muscle weakness  . Penicillins Other (See Comments)    Unknown.  Tolerates cefepime  . Pravastatin Sodium     Pravastatin 40 mg qday and 40 mg q M/W/F caused muscle aches  . Lisinopril Rash  . Promethazine Rash   Patient Measurements: Height: 6\' 2"  (188 cm) Weight: 157 lb 10.1 oz (71.5 kg) IBW/kg (Calculated) : 82.2 Usual Weight: 137-140 lbs (62.2-63.6 kg)  Vital Signs: Temp: 99.3 F (37.4 C) (01/26 0545) Temp Source: Oral (01/26 0545) BP: 134/78 mmHg (01/26 0545) Pulse Rate: 95 (01/26 0545) Intake/Output from previous day: 01/25 0701 - 01/26 0700 In: 740 [P.O.:720] Out: 1760 [Urine:1300; Drains:160; Stool:300] Intake/Output from this shift:   Labs:  Recent Labs  12/19/15 0457 12/19/15 1900 12/20/15 0440 12/21/15 0435  WBC 9.3  --  10.6* 11.5*  HGB 6.9* 8.4* 8.2* 8.4*  HCT 20.6* 25.8* 25.0* 26.6*  PLT 269  --  283 PLATELET CLUMPS NOTED ON SMEAR, COUNT APPEARS ADEQUATE    Recent Labs  12/19/15 0457 12/20/15 0440 12/21/15 0435  NA 136 135 133*  K 4.6 4.7 4.5  CL 97* 96* 95*  CO2 34* 33* 34*  GLUCOSE 198* 139* 85  BUN 23* 22* 18  CREATININE 0.88 0.86 0.73  CALCIUM 8.2* 8.3* 8.0*  MG 1.6* 1.5* 1.5*  PHOS  --  3.3 3.4  PROT  --   --  6.2*  ALBUMIN  --   --  1.8*  AST  --   --  23  ALT  --   --  12*  ALKPHOS  --   --  112  BILITOT  --   --  0.7  Corr Ca 10.1 Estimated Creatinine Clearance: 95.6 mL/min (by C-G formula based on Cr of 0.73).    Recent Labs  12/20/15 1204 12/20/15 1744 12/20/15 2204  GLUCAP 149* 179* 188*   Medical History: Past Medical History  Diagnosis Date  . Hypertension   . CAD (coronary artery disease)   . PVD (peripheral vascular disease) ([place])   . MI  (myocardial infarction) ([place])   . HLD (hyperlipidemia)    Medications:  Infusions:  . Marland KitchenTPN (CLINIMIX-E) Adult 35 mL/hr at 12/20/15 1716   And  . fat emulsion 120 mL (12/20/15 1718)   Insulin Requirements in the past 24 hours: 14 units SSI/24h +  10 units Regular Insulin in TPN/24h  Current Nutrition:  Regular diet + Ensure TID Clinimix-E 5/15 at 35 mL/hr Fat Emulsion 20% at 5 mL/hr  IVF: none  Central access: PICC placed 1/3 TPN start date: 1/3  ASSESSMENT                                                                                                          HPI: 57 yoM presented to ED on 12/27  with abdominal pain.  PMH includes HTN, HLD, PVD - s/p fem bypass, CAD s/p NSTEMI 2014, cardiomyopathy, DM, diabetic peripheral neuropathy, seizures, and chronic pain on chronic opioids.  Surgical history significant for open AAA repair.  CT shows partial SBO, likely adhesions.  Patient refused surgery, until 1/6 s/p repair of perforated viscus.  Plan for conservative management with TPN, bowel rest.   Significant events:  1/1: TPN per pharmacy ordered - unable to start without central access.  1/3: PICC line placed; TPN started 1/5: Acute encephalopathy with fevers (102F) overnight- sepsis protocol initiated.  Broad-spectrum abx started.  1/6: Consented to laparotomy w/ findings of perforated viscus and peritonitis. Txfer to ICU   1/7:  AKI 1/10: NGT removed 1/11: started clear liquid diet 1/13 NPO again for CT guided drain placement for perihepatic fluid collection suspicious for abscess. FL diet post-procedure 1/16: per CCS, no bowel sounds noted today, patient needs to be walked as much as possible 1/18: started full liquid diet, tolerating small amounts. AKI resolved. 1/19: IR: RLQ and RUO drains removed, LUQ drain repositioned 1/22: advanced diet to regular on 1/21 1/23 drains continue to put out yellow purulent drainage 1/24 begin wean of TPN to encourage PO appetite  1/25: 48 hr  calorie count ordered.  Plan is to re-CT once drain output 30-50 ml or less  Today 12/21/2015:   Glucose: control improved after TPN rate weaned by 1/2 on 1/24; though entirely at goal (< 150). No low CBGs. Hx DM on glipizide PTA.   Electrolytes - Mg persistently low despite daily supplementation; Na borderline low. K, CorrCa, Phos all WNL.  Bicarb high  Renal -  SCr remains WNL.   I/O record appears incomplete for intake  Drain: output still elevated at 190 mL total  LFTs - all below ULN 1/23  TGs -  remain wnl  Prealbumin - consistently low, most recently 3.0 (1/23)  Ensure TID - two doses recorded as given yesterday. charted as eating 25% lunch, 0% dinner yesterday; RD following.  NUTRITIONAL GOALS                                                                                             RD recs: 1600-1800 Kcal/day, 65-80 g protein/day, > 2 L fluid/day  Clinimix E 5/15 at a goal rate of 65 ml/hr + 20% fat emulsion at 10 ml/hr provides: 78 g/day protein, 1588 Kcal/day.  PLAN                                                                                                Continue TPN at 1/2 rate per surgery while 48-hr cal count continues  Clinimix E 5/15 at 35 ml/hr   20% Fat Emulsion at 5  mL/hr  Increase insulin in TPN so that patient receives 15 units/24 hr. Pt w/o lows, should be able to tolerate  Standard MVI, MTE in TPN bag.  Continue SSI to resistant scale with meals and at bedtime.  Magnesium sulfate 4g IV x 1 today.  More than recommended per replacement guidelines but note consistently Mg deficient with 2g extra given daily, and only increased from 1.5 > 1.8 after 4g x1 on 1/22  Recheck Mg, BMP tomorrow.  Doubt TPN cause of declining Na now that at 1/2 rate, but will continue to follow  Follow up PO intake. Will discontinue TPN once patient consistently getting at least 60% of calorie needs from oral intake.  Reuel Boom, PharmD, BCPS Pager:  [phone] 12/21/2015, 7:30 AM

## 2015-12-21 NOTE — Care Management Note (Signed)
Case Management Note  Patient Details  Name: RASHARD SHUTT MRN: [id number] Date of Birth: Feb 11, 1951  Subjective/Objective:      Continues with ileus, iv tna, starting po cl. liqs.              Action/Plan:Date: December 21, 2015 Chart reviewed for concurrent status and case management needs. Will continue to follow patient for changes and needs: Velva Harman, BSN, CCM, RN,   [phone]   Expected Discharge Date:   (unknown)               Expected Discharge Plan:  Home/Self Care  In-House Referral:  NA  Discharge planning Services  CM Consult  Post Acute Care Choice:  NA Choice offered to:  NA  DME Arranged:    DME Agency:     HH Arranged:    HH Agency:     Status of Service:  In process, will continue to follow  Medicare Important Message Given:    Date Medicare IM Given:    Medicare IM give by:    Date Additional Medicare IM Given:    Additional Medicare Important Message give by:     If discussed at [place] of Stay Meetings, dates discussed:  [id number]  Additional Comments:  Leeroy Cha, RN 12/21/2015, 10:50 AM

## 2015-12-21 NOTE — Progress Notes (Signed)
Patient ID: [name] [name], male   DOB: 1951-03-26, 65 y.o.   MRN: [id number] [name] PROGRESS NOTE  [name] [name] [id number] DOB: 03-13-1951 DOA: 11/21/2015 PCP: Marijean Bravo, MD  Brief narrative:    65 year old very pleasant male with a past medical history of hyperlipidemia, hypertension, CAD, type 2 diabetes, diabetic peripheral neuropathy, seizure disorder, polysubstance abuse and chronic pain issues (on high doses of oxycodone prior to admission) who was admitted 11/22/15 with a small bowel obstruction. Surgical consultation was obtained on admission with conservative therapy initially recommended. Patient's bowel obstruction was felt to be from adhesions and possible narcotic bowel. His NG tube was removed 11/23/15 and he seemed to be improving clinically but pain returned 11/24/15 with diet advancement to clears. Repeat films showed worsening small bowel dilatation so his NG tube was reinserted. TPN was subsequently ordered. On 11/27/15, the patient refused PICC line insertion for TPN and surgical intervention. PICC finally placed 11/28/15 with initiation of TPN.   Further, patient has developed a right upper extremity DVT and currently is on IV heparin.  On 11/30/15, the patient's condition deteriorated acutely and he was found to be confused and restless. Rapid response was called. He was febrile and tachycardic with an elevated lactate level. Due to concerns for sepsis, broad-spectrum antibiotics were initiated with vancomycin and Primaxin which was switched to cefepime given his seizure history. Patient uderwent exploratory laparotomy on 12/01/15 with findings of perforated viscus with peritoneal contamination/peritonitis. He remained intubated postoperatively and was under the care of the critical care team. He self extubated 12/02/15. Surgery continues to follow, patient has an open abdominal wound. He continues to have an ileus and is on TNA at this time. [name]  assumed care 12/05/15.  Transferred to telemetry floor 12/08/2015.  Barrier to discharge: Slow improvement, ongoing pain, on TNA. Unable to predict when he will be ready for discharge but possible early next week. Chest pain overnight 1/20 with slight bump in troponin but also noted drop in Hg as well from 8.5 --> 7.7 so suspect demand ischemia from blood loss.   Assessment/Plan:    Principal problem: SBO w/ perforated viscous and resultant peritonitis / leukocytosis / sepsis / post op ileus  - S/p abdominal fluid collection drainages 1/13 with removal of RLQ drain 1/20, repositioning of LUQ drain 1/20 and placement of new right subphrenic drain 1/20 - check recent fluid cx's; cont drain irrigation; monitor output closely and rescan once minimal - plan to repeat CT abd per IR team, possibly remove drain later this week  - other plans as per CCS  Active problems:  Chest pain 1/20 secondary to demand ischemia - mild elevation in troponin overnight 1/20 - now resolved   Hypomagnesemia - still low, continue to supplement and repeat Mg level in AM  Inadequate oral intake  - In the context of acute on chronic illness - Diet advancement per Gen surgery - tolerating some oral diet this AM - Continue nutritional support with TPN  Acute upper extremity DVT - US DVT right distal axillary vein, subclavian vein.  - stopped heparin drip due to drop in Hg 1/23 - continue to monitor CBC - per IR, IVC filters are not placed in the arm  Post operative respiratory failure with hypoxemia - Patient required ventilatory support after exploratory laparotomy 12/01/2015.  - Patient self extubated 12/02/2015 - Respiratory status has improved with Lasix 80 mg IV given 12/05/2015 - Respiratory status continues to be stable off oxygen via [place]  Acute Encephalopathy in setting of sepsis / history of seizure disorder - EEG done 11/30/2015 showed no acute seizures - Load with Keppra if seizures occur but so  far no seizures at all  Acute kidney injury - In the setting of sepsis and small bowel obstruction with perforated viscus - Creatinine normalized with hydration  Anemia of chronic disease / thrombocytopenia - Related to history of substance and alcohol use  - transfused two units PRBC 1/23, one unit 1/24 - Hg remains stable for now, please note that Heparin has been stopped due to bleeding  - repeat CBC in AM   Controlled, diabetes mellitus type 2 with peripheral circulatory complications without long-term insulin use - will continue to monitor CBG - Continue sliding scale insulin for now   Left lower extremity wound / S/p left fem pop bypass w/ non-healing ulcer - WOC assessment done   DVT Prophylaxis  - SCD's bilaterally in hospital   Code Status: Full.  Family Communication:  plan of care discussed with the patient, family not at the bedside this am Disposition Plan: unable to predict when pt can be discharged, slowly improving   IV access:  Central line   Procedures and diagnostic studies from 12/05/2015:  Ct Abdomen Pelvis Wo Contrast 12/07/2015   1. There is small bilateral pleural effusion right greater than left. Significant atelectasis or infiltrate in right lower lobe posteriorly. There is superimposed mild interstitial prominence bilateral lower lobes probable mild interstitial edema. 2. There is moderate perihepatic and perisplenic ascites. Moderate ascites noted bilateral paracolic gutters. Please note there is some loculation of perihepatic ascites with mild mass effect on the liver contour please see axial images 32 and 15. Peritoneal inflammation or infection cannot be excluded. 3. No hydronephrosis or hydroureter.  Stable bilateral renal cysts. 4. Postsurgical changes are noted anterior abdominal wall. Incomplete healed/open subcutaneous midline anterior abdominal wound. 5. Mild distended small bowel loops containing oral contrast material and some air-fluid levels. There  is no transition point in caliber of small bowel. Findings most likely due to significant ileus or residual partial small bowel obstruction. There is some oral contrast material noted within distal colon. 6. Moderate pelvic ascites. Moderate gas noted within mid sigmoid colon probable mild ileus. 7. Significant anasarca infiltration of subcutaneous fat abdominal and pelvic wall. 8. 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 Electronically Signed   By: Lahoma Crocker M.D.   On: 12/07/2015 15:18   Dg Chest Port 1 View 12/06/2015  Stable bilateral pulmonary edema, with right basilar subsegmental atelectasis and associated pleural effusion. Interval placement of left internal jugular catheter with distal tip in SVC. No pneumothorax is noted. Electronically Signed   By: Marijo Conception, M.D.   On: 12/06/2015 16:10   Dg Chest Port 1 View  12/05/2015   Worsening of pulmonary interstitial infiltrates bilaterally. This may reflect pulmonary edema of cardiac or noncardiac cause or could reflect pneumonia. There are small bilateral pleural effusions. Electronically Signed   By: David  Martinique M.D.   On: 12/05/2015 08:52   Exploratory laparotomy on 12/01/15   Medical Consultants:  Surgery  CCM transfer care to [facility]10 PCT 12/08/2015 IR  Other Consultants:  Nutrition PT  IAnti-Infectives:   Flagyl 11-30-2015 --> stopped Cefepime 12-02-2015 --> 12/10/2015   Faye Ramsay, MD  [name] Pager [phone]   If 7PM-7AM, please contact night-coverage [email or web address] Password [email or web address] 12/21/2015, 5:47 PM   LOS: 30 days   HPI/Subjective: No new complaints reported today  Objective: Filed Vitals:   12/20/15 1359 12/20/15 2147 12/21/15 0545 12/21/15 1639  BP: 119/81 116/70 134/78 124/76  Pulse: 90 89 95 87  Temp: 99.4 F (37.4 C) 98.1 F (36.7 C) 99.3 F (37.4 C) 98.4 F (36.9 C)  TempSrc: Oral Oral Oral Oral  Resp: 17 17 18 19   Height:      Weight:      SpO2: 98% 98% 99% 97%     Intake/Output Summary (Last 24 hours) at 12/21/15 1747 Last data filed at 12/21/15 1640  Gross per 24 hour  Intake    640 ml  Output   2910 ml  Net  -2270 ml    Exam:   General:  Pt looks better this AM, trying to eat breakfast   Cardiovascular: Rate controlled, (+) S1, S2 wnl  Respiratory:  No wheezing, no rhonchi  Abdomen: Has 2 JP drains, no rebound tenderness, appreciate bowel sounds  Extremities: No edema right lower extremity, has left unaboot (+)  Neuro:  No focal deficits   Data Reviewed: Basic Metabolic Panel:  Recent Labs Lab 12/16/15 0339  12/17/15 0747 12/18/15 0630 12/19/15 0457 12/20/15 0440 12/21/15 0435  NA  --   --  136 133* 136 135 133*  K  --   --  4.1 4.5 4.6 4.7 4.5  CL  --   --  97* 97* 97* 96* 95*  CO2  --   --  31 30 34* 33* 34*  GLUCOSE  --   --  190* 231* 198* 139* 85  BUN  --   --  19 22* 23* 22* 18  CREATININE  --   --  0.90 0.95 0.88 0.86 0.73  CALCIUM  --   --  8.1* 8.2* 8.2* 8.3* 8.0*  MG 1.5*  < > 1.5* 1.8 1.6* 1.5* 1.5*  PHOS 3.4  --  3.3 3.1  --  3.3 3.4  < > = values in this interval not displayed. Liver Function Tests:  Recent Labs Lab 12/18/15 0630 12/21/15 0435  AST 21 23  ALT 11* 12*  ALKPHOS 111 112  BILITOT 0.4 0.7  PROT 6.7 6.2*  ALBUMIN 1.8* 1.8*   CBC:  Recent Labs Lab 12/17/15 0747 12/18/15 0630 12/19/15 0457 12/19/15 1900 12/20/15 0440 12/21/15 0435  WBC 7.1 11.2* 9.3  --  10.6* 11.5*  NEUTROABS  --  7.0  --   --   --   --   HGB 7.4* 5.1* 6.9* 8.4* 8.2* 8.4*  HCT 22.8* 15.6* 20.6* 25.8* 25.0* 26.6*  MCV 84.4 86.7 88.0  --  92.3 95.3  PLT 297 377 269  --  283 PLATELET CLUMPS NOTED ON SMEAR, COUNT APPEARS ADEQUATE   Cardiac Enzymes:  Recent Labs Lab 12/15/15 0530 12/15/15 1400 12/15/15 1948  TROPONINI <0.03 0.11* 0.13*   CBG:  Recent Labs Lab 12/20/15 1744 12/20/15 2204 12/21/15 0800 12/21/15 1213 12/21/15 1654  GLUCAP 179* 188* 104* 139* 114*    Recent Results (from the  past 240 hour(s))  Culture, routine-abscess     Status: None   Collection Time: 12/15/15 11:12 AM  Result Value Ref Range Status   Specimen Description ABSCESS CT RUQ DRAIN  Final   Special Requests Normal  Final   Gram Stain   Final    ABUNDANT WBC PRESENT,BOTH PMN AND MONONUCLEAR NO SQUAMOUS EPITHELIAL CELLS SEEN NO ORGANISMS SEEN Performed at [facility]    Culture   Final    NO GROWTH 2 DAYS Performed  at [facility]    Report Status 12/18/2015 FINAL  Final  Culture, routine-abscess     Status: None   Collection Time: 12/15/15  1:48 PM  Result Value Ref Range Status   Specimen Description JP DRAINAGE  Final   Special Requests NONE  Final   Gram Stain   Final    ABUNDANT WBC PRESENT,BOTH PMN AND MONONUCLEAR NO SQUAMOUS EPITHELIAL CELLS SEEN NO ORGANISMS SEEN Performed at [facility]    Culture   Final    NO GROWTH 2 DAYS Performed at [facility]    Report Status 12/18/2015 FINAL  Final    . sodium chloride   Intravenous Once  . antiseptic oral rinse  7 mL Mouth Rinse q12n4p  . bacitracin   Topical Daily  . chlorhexidine  15 mL Mouth Rinse BID  . collagenase  1 application Topical Daily  . feeding supplement (ENSURE ENLIVE)  237 mL Oral TID BM  . fentaNYL (SUBLIMAZE) injection  50 mcg Intravenous Once  . insulin aspart  0-20 Units Subcutaneous TID AC & HS  . lip balm  1 application Topical BID  . methocarbamol  1,000 mg Oral TID  . sodium chloride  3 mL Intravenous Q12H  . thiamine  100 mg Oral Daily     Continuous Infusions: . Marland KitchenTPN (CLINIMIX-E) Adult 35 mL/hr at 12/20/15 1716   And  . fat emulsion 120 mL (12/20/15 1718)  . Marland KitchenTPN (CLINIMIX-E) Adult     And  . fat emulsion

## 2015-12-21 NOTE — Progress Notes (Signed)
Patient ID: [name] [name], male   DOB: 01-01-1951, 65 y.o.   MRN: [id number]    Referring Physician(s): CCS  Chief Complaint: Right hepatic abscess and intra-abdominal abscess  Subjective: Pt c/o pain and feels like he isn't getting enough pain meds.  Allergies: Zetia; Atorvastatin; Penicillins; Pravastatin sodium; Lisinopril; and Promethazine  Medications: Prior to Admission medications   Medication Sig Start Date End Date Taking? Authorizing Provider  albuterol (PROVENTIL HFA;VENTOLIN HFA) 108 (90 BASE) MCG/ACT inhaler Inhale 2 puffs into the lungs every 6 (six) hours as needed for wheezing or shortness of breath. 11/11/13  Yes Ripudeep Krystal Eaton, MD  aspirin EC 81 MG tablet Take 1 tablet (81 mg total) by mouth daily. 11/06/15  Yes Sherren Mocha, MD  collagenase (SANTYL) ointment Apply thin layer to wound bed of left foot daily Patient taking differently: Apply 1 application topically daily. Apply thin layer to wound bed of left foot daily 10/26/15  Yes Rosetta Posner, MD  gabapentin (NEURONTIN) 300 MG capsule Take 300 mg by mouth 3 (three) times daily. 09/30/15  Yes Historical Provider, MD  glipiZIDE (GLUCOTROL) 10 MG tablet Take 10 mg by mouth 2 (two) times daily before a meal.   Yes Historical Provider, MD  ketoconazole (NIZORAL) 2 % cream Apply 1 application topically daily as needed for irritation.   Yes Historical Provider, MD  Multiple Vitamins-Minerals (MULTIVITAMIN WITH MINERALS) tablet Take 4 tablets by mouth 3 (three) times daily.   Yes Historical Provider, MD  nitroGLYCERIN (NITROSTAT) 0.4 MG SL tablet Place 0.4 mg under the tongue every 5 (five) minutes as needed for chest pain.   Yes Historical Provider, MD  oxyCODONE (ROXICODONE) 15 MG immediate release tablet Take 3 tablets (45 mg total) by mouth every 6 (six) hours as needed (severe pain). 07/30/15  Yes Kimberly A Trinh, PA-C  PATADAY 0.2 % SOLN Place 1 drop into both eyes daily.  08/11/14  Yes Historical Provider, MD    polyethylene glycol (MIRALAX / GLYCOLAX) packet Take 17 g by mouth daily as needed for mild constipation or moderate constipation.    Yes Historical Provider, MD  triamcinolone cream (KENALOG) 0.1 % Apply 1 application topically 2 (two) times daily as needed (for eczema).   Yes Historical Provider, MD  zolpidem (AMBIEN) 10 MG tablet Take 10 mg by mouth at bedtime as needed for sleep.   Yes Historical Provider, MD  silver sulfADIAZINE (SILVADENE) 1 % cream Apply 1 application topically daily. Patient not taking: Reported on 11/14/2015 07/25/15   Rosetta Posner, MD    Vital Signs: BP 134/78 mmHg  Pulse 95  Temp(Src) 99.3 F (37.4 C) (Oral)  Resp 18  Ht 6\' 2"  (1.88 m)  Wt 157 lb 10.1 oz (71.5 kg)  BMI 20.23 kg/m2  SpO2 99%  Physical Exam: Abd: soft, midline wound is dressed.  LUQ drain with more purulent drainage today.  There is still some yellow serous drainage in the bulb as well.  This does not appear enteric.  The RUQ drain is to gravity and is mostly serous.  Both drain sites are c/d/i.  No erythema  Imaging: No results found.  Labs:  CBC:  Recent Labs  12/18/15 0630 12/19/15 0457 12/19/15 1900 12/20/15 0440 12/21/15 0435  WBC 11.2* 9.3  --  10.6* 11.5*  HGB 5.1* 6.9* 8.4* 8.2* 8.4*  HCT 15.6* 20.6* 25.8* 25.0* 26.6*  PLT 377 269  --  283 PLATELET CLUMPS NOTED ON SMEAR, COUNT APPEARS ADEQUATE    COAGS:  Recent Labs  07/26/15 1415 12/06/15 0925 12/08/15 0340  INR 1.09 1.89* 1.82*  APTT 37 45*  --     BMP:  Recent Labs  12/18/15 0630 12/19/15 0457 12/20/15 0440 12/21/15 0435  NA 133* 136 135 133*  K 4.5 4.6 4.7 4.5  CL 97* 97* 96* 95*  CO2 30 34* 33* 34*  GLUCOSE 231* 198* 139* 85  BUN 22* 23* 22* 18  CALCIUM 8.2* 8.2* 8.3* 8.0*  CREATININE 0.95 0.88 0.86 0.73  GFRNONAA >60 >60 >60 >60  GFRAA >60 >60 >60 >60    LIVER FUNCTION TESTS:  Recent Labs  12/11/15 0518 12/14/15 0515 12/18/15 0630 12/21/15 0435  BILITOT 0.9 0.5 0.4 0.7  AST 21 21  21 23   ALT 11* 9* 11* 12*  ALKPHOS 74 101 111 112  PROT 5.8* 6.2* 6.7 6.2*  ALBUMIN 1.9* 1.8* 1.8* 1.8*    Assessment and Plan: 1. S/p abdominal fluid collection drainages 1/13 with removal of RLQ drain 1/20, repositioning of LUQ drain 1/20 and placement of new right subphrenic drain 1/20 - LUQ 90cc -RUQ 70cc -d/w Dr. Laurence Ferrari.  Output is still high enough that we would not recommend a follow up CT scan at this point.  We would recommend one when the output decreases to under 15-20cc/24 hrs or unless indicated otherwise by the surgical team -cont drain irrigations  Electronically Signed: Caera Enwright E 12/21/2015, 9:00 AM   I spent a total of 15 Minutes at the the patient's bedside AND on the patient's hospital floor or unit, greater than 50% of which was counseling/coordinating care for hepatic abscess, intra-abdominal abscess

## 2015-12-22 ENCOUNTER — Encounter (HOSPITAL_COMMUNITY): Payer: Self-pay

## 2015-12-22 LAB — GLUCOSE, CAPILLARY
GLUCOSE-CAPILLARY: 62 mg/dL — AB (ref 65–99)
GLUCOSE-CAPILLARY: 77 mg/dL (ref 65–99)
GLUCOSE-CAPILLARY: 70 mg/dL (ref 65–99)
GLUCOSE-CAPILLARY: 167 mg/dL — AB (ref 65–99)
Glucose-Capillary: 87 mg/dL (ref 65–99)
Glucose-Capillary: 162 mg/dL — ABNORMAL HIGH (ref 65–99)
Glucose-Capillary: 83 mg/dL (ref 65–99)

## 2015-12-22 LAB — BASIC METABOLIC PANEL
Anion gap: 6 (ref 5–15)
BUN: 17 mg/dL (ref 6–20)
CALCIUM: 8 mg/dL — AB (ref 8.9–10.3)
CHLORIDE: 93 mmol/L — AB (ref 101–111)
CO2: 33 mmol/L — ABNORMAL HIGH (ref 22–32)
CREATININE: 0.69 mg/dL (ref 0.61–1.24)
Glucose, Bld: 72 mg/dL (ref 65–99)
Potassium: 4.3 mmol/L (ref 3.5–5.1)
SODIUM: 132 mmol/L — AB (ref 135–145)

## 2015-12-22 LAB — MAGNESIUM: MAGNESIUM: 1.7 mg/dL (ref 1.7–2.4)

## 2015-12-22 LAB — CBC
HCT: 24.7 % — ABNORMAL LOW (ref 39.0–52.0)
Hemoglobin: 7.8 g/dL — ABNORMAL LOW (ref 13.0–17.0)
MCH: 30.2 pg (ref 26.0–34.0)
MCHC: 31.6 g/dL (ref 30.0–36.0)
MCV: 95.7 fL (ref 78.0–100.0)
PLATELETS: 263 10*3/uL (ref 150–400)
RBC: 2.58 MIL/uL — ABNORMAL LOW (ref 4.22–5.81)
RDW: 17.5 % — AB (ref 11.5–15.5)
WBC: 12.7 10*3/uL — AB (ref 4.0–10.5)

## 2015-12-22 MED ORDER — INSULIN ASPART 100 UNIT/ML ~~LOC~~ SOLN
0.0000 [IU] | Freq: Three times a day (TID) | SUBCUTANEOUS | Status: DC
  Administered 2015-12-22 – 2015-12-23 (×3): 3 [IU] via SUBCUTANEOUS
  Administered 2015-12-23: 2 [IU] via SUBCUTANEOUS
  Administered 2015-12-24 – 2015-12-25 (×4): 3 [IU] via SUBCUTANEOUS
  Administered 2015-12-25 – 2015-12-26 (×3): 2 [IU] via SUBCUTANEOUS
  Administered 2015-12-26: 3 [IU] via SUBCUTANEOUS
  Administered 2015-12-26 – 2015-12-27 (×2): 2 [IU] via SUBCUTANEOUS
  Administered 2015-12-28: 3 [IU] via SUBCUTANEOUS
  Administered 2015-12-28 – 2015-12-29 (×4): 2 [IU] via SUBCUTANEOUS
  Administered 2015-12-30: 5 [IU] via SUBCUTANEOUS

## 2015-12-22 MED ORDER — MAGNESIUM SULFATE 2 GM/50ML IV SOLN
2.0000 g | Freq: Once | INTRAVENOUS | Status: AC
  Administered 2015-12-22: 2 g via INTRAVENOUS
  Filled 2015-12-22: qty 50

## 2015-12-22 MED ORDER — FAT EMULSION 20 % IV EMUL
120.0000 mL | INTRAVENOUS | Status: AC
  Administered 2015-12-22: 120 mL via INTRAVENOUS
  Filled 2015-12-22: qty 250

## 2015-12-22 MED ORDER — TRACE MINERALS CR-CU-MN-SE-ZN 10-1000-500-60 MCG/ML IV SOLN
INTRAVENOUS | Status: AC
  Administered 2015-12-22: 18:00:00 via INTRAVENOUS
  Filled 2015-12-22: qty 840

## 2015-12-22 MED ORDER — INSULIN ASPART 100 UNIT/ML ~~LOC~~ SOLN
0.0000 [IU] | Freq: Every day | SUBCUTANEOUS | Status: DC
  Administered 2015-12-24: 2 [IU] via SUBCUTANEOUS

## 2015-12-22 MED ORDER — ACETAMINOPHEN 325 MG PO TABS
650.0000 mg | ORAL_TABLET | Freq: Four times a day (QID) | ORAL | Status: DC | PRN
  Administered 2015-12-22 – 2015-12-24 (×2): 650 mg via ORAL
  Filled 2015-12-22 (×2): qty 2

## 2015-12-22 NOTE — Progress Notes (Signed)
Patient ID: [name] [name], male   DOB: 1951-01-31, 65 y.o.   MRN: [id number]    Referring Physician(s): CCS  Chief Complaint: Hepatic abscess and LUQ abscess  Subjective: Pt wants to know who is managing his medications.  Otherwise no complaints  Allergies: Zetia; Atorvastatin; Penicillins; Pravastatin sodium; Lisinopril; and Promethazine  Medications: Prior to Admission medications   Medication Sig Start Date End Date Taking? Authorizing Provider  albuterol (PROVENTIL HFA;VENTOLIN HFA) 108 (90 BASE) MCG/ACT inhaler Inhale 2 puffs into the lungs every 6 (six) hours as needed for wheezing or shortness of breath. 11/11/13  Yes Ripudeep Krystal Eaton, MD  aspirin EC 81 MG tablet Take 1 tablet (81 mg total) by mouth daily. 11/06/15  Yes Sherren Mocha, MD  collagenase (SANTYL) ointment Apply thin layer to wound bed of left foot daily Patient taking differently: Apply 1 application topically daily. Apply thin layer to wound bed of left foot daily 10/26/15  Yes Rosetta Posner, MD  gabapentin (NEURONTIN) 300 MG capsule Take 300 mg by mouth 3 (three) times daily. 09/30/15  Yes Historical Provider, MD  glipiZIDE (GLUCOTROL) 10 MG tablet Take 10 mg by mouth 2 (two) times daily before a meal.   Yes Historical Provider, MD  ketoconazole (NIZORAL) 2 % cream Apply 1 application topically daily as needed for irritation.   Yes Historical Provider, MD  Multiple Vitamins-Minerals (MULTIVITAMIN WITH MINERALS) tablet Take 4 tablets by mouth 3 (three) times daily.   Yes Historical Provider, MD  nitroGLYCERIN (NITROSTAT) 0.4 MG SL tablet Place 0.4 mg under the tongue every 5 (five) minutes as needed for chest pain.   Yes Historical Provider, MD  oxyCODONE (ROXICODONE) 15 MG immediate release tablet Take 3 tablets (45 mg total) by mouth every 6 (six) hours as needed (severe pain). 07/30/15  Yes Kimberly A Trinh, PA-C  PATADAY 0.2 % SOLN Place 1 drop into both eyes daily.  08/11/14  Yes Historical Provider, MD  polyethylene  glycol (MIRALAX / GLYCOLAX) packet Take 17 g by mouth daily as needed for mild constipation or moderate constipation.    Yes Historical Provider, MD  triamcinolone cream (KENALOG) 0.1 % Apply 1 application topically 2 (two) times daily as needed (for eczema).   Yes Historical Provider, MD  zolpidem (AMBIEN) 10 MG tablet Take 10 mg by mouth at bedtime as needed for sleep.   Yes Historical Provider, MD  silver sulfADIAZINE (SILVADENE) 1 % cream Apply 1 application topically daily. Patient not taking: Reported on 11/14/2015 07/25/15   Rosetta Posner, MD    Vital Signs: BP 135/85 mmHg  Pulse 89  Temp(Src) 98.8 F (37.1 C) (Oral)  Resp 22  Ht 6\' 2"  (1.88 m)  Wt 157 lb 10.1 oz (71.5 kg)  BMI 20.23 kg/m2  SpO2 93%  Physical Exam: Abd: soft, midline wound dressed, RUQ drain is slightly more sero-cloudy today than yesterday (not frankly purulent).  Abut 50cc currently in gravity bag.  LUQ drain with clear watery output mixed with purulent drainage. About 15-20cc currently in bulb.  Imaging: No results found.  Labs:  CBC:  Recent Labs  12/19/15 0457 12/19/15 1900 12/20/15 0440 12/21/15 0435 12/22/15 0553  WBC 9.3  --  10.6* 11.5* 12.7*  HGB 6.9* 8.4* 8.2* 8.4* 7.8*  HCT 20.6* 25.8* 25.0* 26.6* 24.7*  PLT 269  --  283 PLATELET CLUMPS NOTED ON SMEAR, COUNT APPEARS ADEQUATE 263    COAGS:  Recent Labs  07/26/15 1415 12/06/15 0925 12/08/15 0340  INR 1.09 1.89* 1.82*  APTT 37 45*  --     BMP:  Recent Labs  12/19/15 0457 12/20/15 0440 12/21/15 0435 12/22/15 0553  NA 136 135 133* 132*  K 4.6 4.7 4.5 4.3  CL 97* 96* 95* 93*  CO2 34* 33* 34* 33*  GLUCOSE 198* 139* 85 72  BUN 23* 22* 18 17  CALCIUM 8.2* 8.3* 8.0* 8.0*  CREATININE 0.88 0.86 0.73 0.69  GFRNONAA >60 >60 >60 >60  GFRAA >60 >60 >60 >60    LIVER FUNCTION TESTS:  Recent Labs  12/11/15 0518 12/14/15 0515 12/18/15 0630 12/21/15 0435  BILITOT 0.9 0.5 0.4 0.7  AST 21 21 21 23   ALT 11* 9* 11* 12*    ALKPHOS 74 101 111 112  PROT 5.8* 6.2* 6.7 6.2*  ALBUMIN 1.9* 1.8* 1.8* 1.8*    Assessment and Plan: 1. S/p abdominal fluid collection drainages 1/13 with removal of RLQ drain 1/20, repositioning of LUQ drain 1/20 and placement of new right subphrenic drain 1/20 -cont both drains.  Output documentation yesterday is not correct.  Output still remains too high for repeat CT scan at this time. -cont drain irrigation  Electronically Signed: Jiya Kissinger E 12/22/2015, 10:22 AM   I spent a total of 15 Minutes at the the patient's bedside AND on the patient's hospital floor or unit, greater than 50% of which was counseling/coordinating care for hepatic and intra-abdominal abscesses, drain management.

## 2015-12-22 NOTE — Clinical Social Work Note (Signed)
Per attending pt will be here through the weekend. CSW will continue to follow for d/c needs. He hasn't been accepted by any SNF's yet.    Cindra Presume, LCSW [phone] Hospital [facility] 5E, 5W [id number] Licensed Clinical Social Worker

## 2015-12-22 NOTE — Progress Notes (Signed)
[facility] Progress Note  21 Days Post-Op  Subjective: Pt constantly complaining of pain in his abdomen and foot.  Wound is healing nicely, he tolerates dressing changes well.  Mobilizing some with therapy, he wants to mobilize more.  Tolerating diet, he's quite hungry, using ensures.  Having BM's and flatus.    Objective: Vital signs in last 24 hours: Temp:  [98.3 F (36.8 C)-98.8 F (37.1 C)] 98.8 F (37.1 C) (01/27 0432) Pulse Rate:  [87-95] 89 (01/27 0432) Resp:  [18-22] 22 (01/27 0432) BP: (124-135)/(76-88) 135/85 mmHg (01/27 0432) SpO2:  [95 %-98 %] 95 % (01/27 0432) Last BM Date: 12/20/15  Intake/Output from previous day: 01/26 0701 - 01/27 0700 In: 30  Out: 2450 [Urine:2425; Drains:25] Intake/Output this shift:    PE: Gen: Alert, NAD, pleasant Abd: Soft, NT/ND, +BS, no HSM, incisions good granulation tissue, retention sutures in place, gravity bag with more seropurulent drainage, bulb drain with purulent drainage Ext: RUE quite edematous, rest of extremities with more minor swelling  Lab Results:   Recent Labs  12/21/15 0435 12/22/15 0553  WBC 11.5* 12.7*  HGB 8.4* 7.8*  HCT 26.6* 24.7*  PLT PLATELET CLUMPS NOTED ON SMEAR, COUNT APPEARS ADEQUATE 263   BMET  Recent Labs  12/21/15 0435 12/22/15 0553  NA 133* 132*  K 4.5 4.3  CL 95* 93*  CO2 34* 33*  GLUCOSE 85 72  BUN 18 17  CREATININE 0.73 0.69  CALCIUM 8.0* 8.0*   PT/INR No results for input(s): LABPROT, INR in the last 72 hours. CMP     Component Value Date/Time   NA 132* 12/22/2015 0553   K 4.3 12/22/2015 0553   CL 93* 12/22/2015 0553   CO2 33* 12/22/2015 0553   GLUCOSE 72 12/22/2015 0553   BUN 17 12/22/2015 0553   CREATININE 0.69 12/22/2015 0553   CALCIUM 8.0* 12/22/2015 0553   PROT 6.2* 12/21/2015 0435   ALBUMIN 1.8* 12/21/2015 0435   AST 23 12/21/2015 0435   ALT 12* 12/21/2015 0435   ALKPHOS 112 12/21/2015 0435   BILITOT 0.7 12/21/2015 0435   GFRNONAA >60  12/22/2015 0553   GFRAA >60 12/22/2015 0553   Lipase     Component Value Date/Time   LIPASE 21 11/21/2015 1621       Studies/Results: No results found.  Anti-infectives: Anti-infectives    Start     Dose/Rate Route Frequency Ordered Stop   12/15/15 0800  metroNIDAZOLE (FLAGYL) tablet 500 mg  Status:  Discontinued     500 mg Oral Every 6 hours 12/15/15 0714 12/20/15 1055   12/08/15 0800  metroNIDAZOLE (FLAGYL) IVPB 500 mg  Status:  Discontinued     500 mg 100 mL/hr over 60 Minutes Intravenous Every 6 hours 12/08/15 0737 12/15/15 0714   12/03/15 1800  vancomycin (VANCOCIN) IVPB 750 mg/150 ml premix  Status:  Discontinued     750 mg 150 mL/hr over 60 Minutes Intravenous Every 24 hours 12/03/15 1715 12/04/15 0911   12/02/15 1800  ceFEPIme (MAXIPIME) 1 g in dextrose 5 % 50 mL IVPB     1 g 100 mL/hr over 30 Minutes Intravenous Every 12 hours 12/02/15 1014 12/09/15 1759   12/02/15 0600  vancomycin (VANCOCIN) IVPB 1000 mg/200 mL premix  Status:  Discontinued     1,000 mg 200 mL/hr over 60 Minutes Intravenous 3 times per day 12/02/15 0547 12/02/15 0954   12/01/15 2200  ceFEPIme (MAXIPIME) 1 g in dextrose 5 % 50 mL IVPB  Status:  Discontinued     1 g 100 mL/hr over 30 Minutes Intravenous 3 times per day 12/01/15 1455 12/02/15 1014   11/30/15 1400  ceFEPIme (MAXIPIME) 1 g in dextrose 5 % 50 mL IVPB  Status:  Discontinued     1 g 100 mL/hr over 30 Minutes Intravenous 3 times per day 11/30/15 0913 12/01/15 1455   11/30/15 1000  metroNIDAZOLE (FLAGYL) IVPB 500 mg     500 mg 100 mL/hr over 60 Minutes Intravenous Every 8 hours 11/30/15 0858 12/07/15 0226   11/30/15 0645  vancomycin (VANCOCIN) IVPB 750 mg/150 ml premix  Status:  Discontinued     750 mg 150 mL/hr over 60 Minutes Intravenous 3 times per day 11/30/15 0630 12/02/15 0547   11/30/15 0645  imipenem-cilastatin (PRIMAXIN) 500 mg in sodium chloride 0.9 % 100 mL IVPB  Status:  Discontinued     500 mg 200 mL/hr over 30 Minutes  Intravenous 3 times per day 11/30/15 0630 11/30/15 0859       Assessment/Plan POD #21 exlap, and repair of SB perforation in setting of SBO--Dr. Hassell Done 12/01/15 -Pulmonary toilet, mobilize  -BID wet to dry dressing changes to open wound/retention sutures -Robaxin, no Toradol due to bleeding -PRN oxyIR, wean dilaudid IV (per [place] narcotic database gets 15mg  oxyIR tablets #270 per month) -His pain is hard to manage due to opoid addiction which he freely admits.  His exam and vitals are WNL and not alarming for uncontrolled pain.  Every time the nurses check on him he's sleeping and comfortable, but he subjectively complains of significant pain anytime he's awake.  He's refusing doses of oral pain meds and robaxin because he wants only the IV.  We discussed he can not go home with IV pain meds, and he understands this, but does not understand that we have to slowly wean him to a home dosage.   Post op intra-abdominal abscesses -RUQ drain placement 1/20 Dr. Vernard Gambles with seropurulent drainage (38ml recorded but still with about 87mL in bag currently) -LUQ bulb drain in place with purulent drainage (2ml) -Repeat CT per IR's recommendations - today or tomorrow? ABL Anemia - Hgb 7.8, 2 units on 1/23, 1 unit on 1/24 Post op ileus-resolved ID-IR abscess culture NGTD final RUE DVT-SCD's, heparin gtt on hold due to bleeding, pending filter per primary PCM-continue 1/2 dose TPN until calorie count is complete. Consider discontinuing TPN soon.  Prealbumin is 3.0 (1/23). Dietitian following, protein supplements. FEN-reg diet as tolerated, cal count Dispo-After results of CT scan we can decide about discontinuing drains, we can work towards getting him to SNF if abscesses are improved, and he becomes medically stable  Will follow up with Dr. Hassell Done as an outpatient.    LOS: 31 days    Nat Christen 12/22/2015, 8:54 AM Pager: [phone]

## 2015-12-22 NOTE — Progress Notes (Signed)
Patient ID: [name] [name], male   DOB: [birth year], 65 y.o.   MRN: [id number] [name] PROGRESS NOTE  [name] [name] [id number] DOB: [birth year] DOA: 11/21/2015 PCP: Marijean Bravo, MD  Brief narrative:    65 year old very pleasant male with a past medical history of hyperlipidemia, hypertension, CAD, type 2 diabetes, diabetic peripheral neuropathy, seizure disorder, polysubstance abuse and chronic pain issues (on high doses of oxycodone prior to admission) who was admitted 11/22/15 with a small bowel obstruction. Surgical consultation was obtained on admission with conservative therapy initially recommended. Patient's bowel obstruction was felt to be from adhesions and possible narcotic bowel. His NG tube was removed 11/23/15 and he seemed to be improving clinically but pain returned 11/24/15 with diet advancement to clears. Repeat films showed worsening small bowel dilatation so his NG tube was reinserted. TPN was subsequently ordered. On 11/27/15, the patient refused PICC line insertion for TPN and surgical intervention. PICC finally placed 11/28/15 with initiation of TPN.   Further, patient has developed a right upper extremity DVT and currently is on IV heparin.  On 11/30/15, the patient's condition deteriorated acutely and he was found to be confused and restless. Rapid response was called. He was febrile and tachycardic with an elevated lactate level. Due to concerns for sepsis, broad-spectrum antibiotics were initiated with vancomycin and Primaxin which was switched to cefepime given his seizure history. Patient uderwent exploratory laparotomy on 12/01/15 with findings of perforated viscus with peritoneal contamination/peritonitis. He remained intubated postoperatively and was under the care of the critical care team. He self extubated 12/02/15. Surgery continues to follow, patient has an open abdominal wound. He continues to have an ileus and is on TNA at this time. [name]  assumed care 12/05/15.  Transferred to telemetry floor 12/08/2015.  Barrier to discharge: Slow improvement, ongoing pain, on TNA. Unable to predict when he will be ready for discharge but possible early next week. Chest pain overnight 1/20 with slight bump in troponin but also noted drop in Hg as well from 8.5 --> 7.7 so suspect demand ischemia from blood loss.   Assessment/Plan:    Principal problem: SBO w/ perforated viscous and resultant peritonitis / leukocytosis / sepsis / post op ileus  - S/p abdominal fluid collection drainages 1/13 with removal of RLQ drain 1/20, repositioning of LUQ drain 1/20 and placement of new right subphrenic drain 1/20 - cont drain irrigation; monitor output closely and rescan once minimal - plan to repeat CT abd per IR team, possibly remove drain later this week  - other plans as per CCS  Active problems:  Chest pain 1/20 secondary to demand ischemia - mild elevation in troponin overnight 1/20 - now resolved   Hypomagnesemia - continue to supplement and repeat Mg level in AM - try to keep Mg ~ 2  Inadequate oral intake  - In the context of acute on chronic illness - Diet advancement per [facility] surgery - tolerating some oral diet  - Continue nutritional support with TPN  Acute upper extremity DVT - US DVT right distal axillary vein, subclavian vein.  - stopped heparin drip due to drop in Hg 1/23 - continue to monitor CBC - per IR, IVC filters are not placed in the arm  Post operative respiratory failure with hypoxemia - Patient required ventilatory support after exploratory laparotomy 12/01/2015.  - Patient self extubated 12/02/2015 - Respiratory status has improved with Lasix 80 mg IV given 12/05/2015 - Respiratory status continues to be stable off oxygen via [place]  Acute Encephalopathy in setting of sepsis / history of seizure disorder - EEG done 11/30/2015 showed no acute seizures - Load with Keppra if seizures occur but so far no seizures at  all  Acute kidney injury - In the setting of sepsis and small bowel obstruction with perforated viscus - Creatinine normalized with hydration  Anemia of chronic disease / thrombocytopenia - Related to history of substance and alcohol use  - transfused two units PRBC 1/23, one unit 1/24 - Hg slightly down overnight, please note that Heparin has been stopped due to bleeding  - repeat CBC in AM   Controlled, diabetes mellitus type 2 with peripheral circulatory complications without long-term insulin use - will continue to monitor CBG - Continue sliding scale insulin for now   Left lower extremity wound / S/p left fem pop bypass w/ non-healing ulcer - WOC assessment done   DVT Prophylaxis  - SCD's bilaterally in hospital   Code Status: Full.  Family Communication:  plan of care discussed with the patient, family not at the bedside this am Disposition Plan: unable to predict when pt can be discharged, slowly improving   IV access:  Central line   Procedures and diagnostic studies from 12/05/2015:  Ct Abdomen Pelvis Wo Contrast 12/07/2015   1. There is small bilateral pleural effusion right greater than left. Significant atelectasis or infiltrate in right lower lobe posteriorly. There is superimposed mild interstitial prominence bilateral lower lobes probable mild interstitial edema. 2. There is moderate perihepatic and perisplenic ascites. Moderate ascites noted bilateral paracolic gutters. Please note there is some loculation of perihepatic ascites with mild mass effect on the liver contour please see axial images 32 and 15. Peritoneal inflammation or infection cannot be excluded. 3. No hydronephrosis or hydroureter.  Stable bilateral renal cysts. 4. Postsurgical changes are noted anterior abdominal wall. Incomplete healed/open subcutaneous midline anterior abdominal wound. 5. Mild distended small bowel loops containing oral contrast material and some air-fluid levels. There is no transition  point in caliber of small bowel. Findings most likely due to significant ileus or residual partial small bowel obstruction. There is some oral contrast material noted within distal colon. 6. Moderate pelvic ascites. Moderate gas noted within mid sigmoid colon probable mild ileus. 7. Significant anasarca infiltration of subcutaneous fat abdominal and pelvic wall. 8. 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 Electronically Signed   By: Lahoma Crocker M.D.   On: 12/07/2015 15:18   Dg Chest Port 1 View 12/06/2015  Stable bilateral pulmonary edema, with right basilar subsegmental atelectasis and associated pleural effusion. Interval placement of left internal jugular catheter with distal tip in SVC. No pneumothorax is noted. Electronically Signed   By: Marijo Conception, M.D.   On: 12/06/2015 16:10   Dg Chest Port 1 View  12/05/2015   Worsening of pulmonary interstitial infiltrates bilaterally. This may reflect pulmonary edema of cardiac or noncardiac cause or could reflect pneumonia. There are small bilateral pleural effusions. Electronically Signed   By: David  Martinique M.D.   On: 12/05/2015 08:52   Exploratory laparotomy on 12/01/15   Medical Consultants:  Surgery  CCM transfer care to [facility] 1-10 PCT 12/08/2015 IR  Other Consultants:  Nutrition PT  IAnti-Infectives:   Flagyl 11-30-2015 --> stopped Cefepime 12-02-2015 --> 12/10/2015   Faye Ramsay, MD  [name] Pager [phone]   If 7PM-7AM, please contact night-coverage [email or web address] Password [email or web address] 12/22/2015, 7:06 AM   LOS: 31 days   HPI/Subjective: No new complaints reported today  Objective:  Filed Vitals:   12/21/15 0545 12/21/15 1639 12/21/15 2113 12/22/15 0432  BP: 134/78 124/76 131/88 135/85  Pulse: 95 87 95 89  Temp: 99.3 F (37.4 C) 98.4 F (36.9 C) 98.3 F (36.8 C) 98.8 F (37.1 C)  TempSrc: Oral Oral Oral Oral  Resp: 18 19 18 22   Height:      Weight:      SpO2: 99% 97% 98% 95%     Intake/Output Summary (Last 24 hours) at 12/22/15 0706 Last data filed at 12/22/15 0356  Gross per 24 hour  Intake     30 ml  Output   2450 ml  Net  -2420 ml    Exam:   General:  Pt looks better this AM, trying to eat breakfast   Cardiovascular: Rate controlled, (+) S1, S2 wnl  Respiratory:  No wheezing, no rhonchi  Abdomen: Has 2 JP drains, no rebound tenderness, appreciate bowel sounds  Extremities: No edema right lower extremity, has left unaboot (+)  Neuro:  No focal deficits   Data Reviewed: Basic Metabolic Panel:  Recent Labs Lab 12/16/15 0339  12/17/15 0747 12/18/15 0630 12/19/15 0457 12/20/15 0440 12/21/15 0435 12/22/15 0553  NA  --   < > 136 133* 136 135 133* 132*  K  --   < > 4.1 4.5 4.6 4.7 4.5 4.3  CL  --   < > 97* 97* 97* 96* 95* 93*  CO2  --   < > 31 30 34* 33* 34* 33*  GLUCOSE  --   < > 190* 231* 198* 139* 85 72  BUN  --   < > 19 22* 23* 22* 18 17  CREATININE  --   < > 0.90 0.95 0.88 0.86 0.73 0.69  CALCIUM  --   < > 8.1* 8.2* 8.2* 8.3* 8.0* 8.0*  MG 1.5*  < > 1.5* 1.8 1.6* 1.5* 1.5* 1.7  PHOS 3.4  --  3.3 3.1  --  3.3 3.4  --   < > = values in this interval not displayed. Liver Function Tests:  Recent Labs Lab 12/18/15 0630 12/21/15 0435  AST 21 23  ALT 11* 12*  ALKPHOS 111 112  BILITOT 0.4 0.7  PROT 6.7 6.2*  ALBUMIN 1.8* 1.8*   CBC:  Recent Labs Lab 12/18/15 0630 12/19/15 0457 12/19/15 1900 12/20/15 0440 12/21/15 0435 12/22/15 0553  WBC 11.2* 9.3  --  10.6* 11.5* 12.7*  NEUTROABS 7.0  --   --   --   --   --   HGB 5.1* 6.9* 8.4* 8.2* 8.4* 7.8*  HCT 15.6* 20.6* 25.8* 25.0* 26.6* 24.7*  MCV 86.7 88.0  --  92.3 95.3 95.7  PLT 377 269  --  283 PLATELET CLUMPS NOTED ON SMEAR, COUNT APPEARS ADEQUATE 263   Cardiac Enzymes:  Recent Labs Lab 12/15/15 1400 12/15/15 1948  TROPONINI 0.11* 0.13*   CBG:  Recent Labs Lab 12/21/15 1654 12/21/15 2117 12/22/15 0348 12/22/15 0428 12/22/15 0457  GLUCAP 114* 167* 62* 77 87     Recent Results (from the past 240 hour(s))  Culture, routine-abscess     Status: None   Collection Time: 12/15/15 11:12 AM  Result Value Ref Range Status   Specimen Description ABSCESS CT RUQ DRAIN  Final   Special Requests Normal  Final   Gram Stain   Final    ABUNDANT WBC PRESENT,BOTH PMN AND MONONUCLEAR NO SQUAMOUS EPITHELIAL CELLS SEEN NO ORGANISMS SEEN Performed at [facility]  Final    NO GROWTH 2 DAYS Performed at [facility]    Report Status 12/18/2015 FINAL  Final  Culture, routine-abscess     Status: None   Collection Time: 12/15/15  1:48 PM  Result Value Ref Range Status   Specimen Description JP DRAINAGE  Final   Special Requests NONE  Final   Gram Stain   Final    ABUNDANT WBC PRESENT,BOTH PMN AND MONONUCLEAR NO SQUAMOUS EPITHELIAL CELLS SEEN NO ORGANISMS SEEN Performed at [facility]    Culture   Final    NO GROWTH 2 DAYS Performed at [facility]    Report Status 12/18/2015 FINAL  Final    . sodium chloride   Intravenous Once  . antiseptic oral rinse  7 mL Mouth Rinse q12n4p  . bacitracin   Topical Daily  . chlorhexidine  15 mL Mouth Rinse BID  . collagenase  1 application Topical Daily  . feeding supplement (ENSURE ENLIVE)  237 mL Oral TID BM  . fentaNYL (SUBLIMAZE) injection  50 mcg Intravenous Once  . insulin aspart  0-15 Units Subcutaneous TID WC  . insulin aspart  0-5 Units Subcutaneous QHS  . lip balm  1 application Topical BID  . methocarbamol  1,000 mg Oral TID  . sodium chloride  3 mL Intravenous Q12H  . thiamine  100 mg Oral Daily     Continuous Infusions: . Marland KitchenTPN (CLINIMIX-E) Adult 35 mL/hr at 12/21/15 1814   And  . fat emulsion 120 mL (12/21/15 1815)

## 2015-12-22 NOTE — Progress Notes (Signed)
PARENTERAL NUTRITION CONSULT NOTE - FOLLOW-UP  Pharmacy Consult for TPN Indication: Bowel obstruction, post op ileus  Allergies  Allergen Reactions  . Zetia [Ezetimibe] Anaphylaxis and Swelling    Tongue and throat   . Atorvastatin Other (See Comments)    Malaise & muscle weakness  . Penicillins Other (See Comments)    Unknown.  Tolerates cefepime  . Pravastatin Sodium     Pravastatin 40 mg qday and 40 mg q M/W/F caused muscle aches  . Lisinopril Rash  . Promethazine Rash   Patient Measurements: Height: 6\' 2"  (188 cm) Weight: 157 lb 10.1 oz (71.5 kg) IBW/kg (Calculated) : 82.2 Usual Weight: 137-140 lbs (62.2-63.6 kg)  Vital Signs: Temp: 98.8 F (37.1 C) (01/27 0432) Temp Source: Oral (01/27 0432) BP: 135/85 mmHg (01/27 0432) Pulse Rate: 89 (01/27 0432) Intake/Output from previous day: 01/26 0701 - 01/27 0700 In: 30  Out: 2450 [Urine:2425; Drains:25] Intake/Output from this shift:   Labs:  Recent Labs  12/20/15 0440 12/21/15 0435 12/22/15 0553  WBC 10.6* 11.5* 12.7*  HGB 8.2* 8.4* 7.8*  HCT 25.0* 26.6* 24.7*  PLT 283 PLATELET CLUMPS NOTED ON SMEAR, COUNT APPEARS ADEQUATE 263    Recent Labs  12/20/15 0440 12/21/15 0435 12/22/15 0553  NA 135 133* 132*  K 4.7 4.5 4.3  CL 96* 95* 93*  CO2 33* 34* 33*  GLUCOSE 139* 85 72  BUN 22* 18 17  CREATININE 0.86 0.73 0.69  CALCIUM 8.3* 8.0* 8.0*  MG 1.5* 1.5* 1.7  PHOS 3.3 3.4  --   PROT  --  6.2*  --   ALBUMIN  --  1.8*  --   AST  --  23  --   ALT  --  12*  --   ALKPHOS  --  112  --   BILITOT  --  0.7  --   Corr Ca 9.8 Estimated Creatinine Clearance: 95.6 mL/min (by C-G formula based on Cr of 0.69).    Recent Labs  12/22/15 0428 12/22/15 0457 12/22/15 0817  GLUCAP 77 87 70  1/27 0348: CBG 62     Medical History: Past Medical History  Diagnosis Date  . Hypertension   . CAD (coronary artery disease)   . PVD (peripheral vascular disease) ([place])   . MI (myocardial infarction) ([place])   . HLD  (hyperlipidemia)   . Polysubstance abuse   . Acute kidney injury ([place])   . Anemia    Medications:  Infusions:  . Marland KitchenTPN (CLINIMIX-E) Adult 35 mL/hr at 12/21/15 1814   And  . fat emulsion 120 mL (12/21/15 1815)  . Marland KitchenTPN (CLINIMIX-E) Adult     And  . fat emulsion     Insulin Requirements in the past 24 hours: 7 units SSI/24h +  15 units Regular Insulin in TPN/24h  Current Nutrition:  Regular diet + Ensure TID Clinimix-E 5/15 at 35 mL/hr Fat Emulsion 20% at 5 mL/hr  IVF: none  Central access: PICC placed 1/3 TPN start date: 1/3  ASSESSMENT  HPI: 19 yoM presented to ED on 12/27 with abdominal pain.  PMH includes HTN, HLD, PVD - s/p fem bypass, CAD s/p NSTEMI 2014, cardiomyopathy, DM, diabetic peripheral neuropathy, seizures, and chronic pain on chronic opioids.  Surgical history significant for open AAA repair.  CT shows partial SBO, likely adhesions.  Patient refused surgery, until 1/6 s/p repair of perforated viscus.  Plan for conservative management with TPN, bowel rest.   Significant events:  1/1: TPN per pharmacy ordered - unable to start without central access.  1/3: PICC line placed; TPN started 1/5: Acute encephalopathy with fevers (102F) overnight- sepsis protocol initiated.  Broad-spectrum abx started.  1/6: Consented to laparotomy w/ findings of perforated viscus and peritonitis. Txfer to ICU   1/7:  AKI 1/10: NGT removed 1/11: started clear liquid diet 1/13 NPO again for CT guided drain placement for perihepatic fluid collection suspicious for abscess. FL diet post-procedure 1/16: per CCS, no bowel sounds noted today, patient needs to be walked as much as possible 1/18: started full liquid diet, tolerating small amounts. AKI resolved. 1/19: IR: RLQ and RUO drains removed, LUQ drain repositioned 1/22: advanced diet to regular on 1/21 1/23 drains continue to put out  yellow purulent drainage 1/24 begin wean of TPN to encourage PO appetite  1/25: 48 hr calorie count ordered.  Plan is to re-CT once drain output 30-50 ml or less 1/27: mild hypoglycemic episode (CBG 62)  Today 12/22/2015:   Glucose: mild single hypoglycemic episode (CBG 62), resolved after apple juice.Marland Kitchen Hx DM on glipizide PTA.   Electrolytes - Mg at LLN; previously ran low for several days despite IV supplementation.  K, Phos WNL.  CO2 low, Cl high.  Na slightly low.  Renal -  SCr remains WNL. UOP 2.4 L  I/O record appears incomplete for intake  Drain: output charted as 25 mL but PA indicates actual value is higher; too high for repeat CT scan today  LFTs - all below ULN 1/23  TGs -  remain wnl 1/23  Prealbumin - consistently low, most recently 3.0 (1/23)  Dietary intake remains suboptimal.  RD following.  NUTRITIONAL GOALS                                                                                             RD recs: 1600-1800 Kcal/day, 65-80 g protein/day, > 2 L fluid/day  Clinimix E 5/15 at a goal rate of 65 ml/hr + 20% fat emulsion at 10 ml/hr provides: 78 g/day protein, 1588 Kcal/day.  PLAN                                                                                                Continue TPN at 1/2 rate per surgery in hopes of  stimulating appetite:  Clinimix E 5/15 at 35 ml/hr   20% Fat Emulsion at 5 mL/hr  Reduce regular insulin in TPN to 10 units / 24 hr  Reduce SSI insulin to "moderate" scale coverage  Continue Standard MVI, MTE in TPN bag.  Continue SSI to resistant scale with meals and at bedtime.  Magnesium sulfate 2g IV x 1 today.    Recheck BMet, Mg, Phos tomorrow.  Routine TPN labs Mondays and Thursdays.  Follow PO intake. Consider discontinuing TPN once patient consistently getting at least 60% of calorie needs from oral intake.  Clayburn Pert, PharmD, BCPS Pager: [phone] 12/22/2015  10:42 AM

## 2015-12-22 NOTE — Progress Notes (Signed)
Physical Therapy Treatment Patient Details Name: [name] [name] MRN: [id number] DOB: [birth year] Today's Date: 12/22/2015    History of Present Illness 65 year old aam who was admitted on 12/27 w/ abd pain found to be 2/2 SBO on CT scan. Marland Kitchen His hospital course was complicated by fever spike and delirium on 1/5. At that time has abd was more distended.  He went for exploratory lap 12/01/15 which was notable for significant peritoneal contamination. He is s/p exploration, washout and repair of perforated viscous    PT Comments    Pt is not progressing; He continues to have profound LE weakness and he is uncooperative with PT today, he agreed to get OOB to w/c on my first visit, when PT returned with w/c he would not attempt transfer; He asks PT to leave w/c so he can get in it later; lengthy discussion with pt regarding  his limited mobility and importance of PT however he continues to be unwilling to attempt OOB or do more than ROM in bed; He demonstrates significantly limited insight into his deficits/need for assistance, he is tangential at times and unable to follow commands consistently;  Will continue to follow 2x/wk; recommend pt be OOB with nsg staff and maximove; discussed with nursing and nurse tech, it is questionable if pt would cooperate with this either   Follow Up Recommendations  SNF;Supervision/Assistance - 24 hour     Equipment Recommendations  Wheelchair (measurements PT)    Recommendations for Other Services       Precautions / Restrictions Precautions Precautions: Fall Precaution Comments: profound LE weakness, abd  drains and from back on R Restrictions Weight Bearing Restrictions: No    Mobility  Bed Mobility                  Transfers                    Ambulation/Gait                 Holiday representative Wheelchair mobility:  (w/c to room, reviewed use with pt however pt refused to get  )  Modified Rankin (Stroke Patients Only)       Balance                                    Cognition Arousal/Alertness: Awake/alert Behavior During Therapy: Agitated Overall Cognitive Status: Impaired/Different from baseline Area of Impairment: Safety/judgement;Awareness         Safety/Judgement: Decreased awareness of safety;Decreased awareness of deficits     General Comments: pt answers all questions with a question;     Exercises General Exercises - Lower Extremity Quad Sets: Limitations (profound weakness) Pt is able to perform heel slides with assist but unwilling to continue d/t his "brother" being there   General Comments        Pertinent Vitals/Pain Pain Assessment: 0-10 Pain Score: 6  Pain Location: abd Pain Descriptors / Indicators: Constant Pain Intervention(s): Monitored during session    Home Living                      Prior Function            PT Goals (current goals can now be found in the care plan section) Acute Rehab PT Goals Patient Stated Goal: none  stated PT Goal Formulation: With patient Time For Goal Achievement: 01/03/16 Potential to Achieve Goals: Fair Progress towards PT goals: Not progressing toward goals - comment    Frequency  Min 2X/week    PT Plan Current plan remains appropriate;Frequency needs to be updated    Co-evaluation             End of Session   Activity Tolerance: Treatment limited secondary to agitation Patient left: in bed;with call bell/phone within reach;with family/visitor present     Time: 1007-1018 (and 1000-1004) PT Time Calculation (min) (ACUTE ONLY): 11 min  Charges:  $Self Care/Home Management: 8-22                    G Codes:      Bianca Raneri 01/13/2016, 10:16 AM

## 2015-12-22 NOTE — Progress Notes (Signed)
Nutrition Follow-up  DOCUMENTATION CODES:   Not applicable  INTERVENTION:  -TPN per surgery and pharmacy discretion  -Continue Ensure Enlive TID 350 kcal, 20 grams of Protein per bottle -Continue to encourage PO intake   NUTRITION DIAGNOSIS:    (Excessive nutrient intake) related to  (TPN, Ensure Enlive, and PO intake) as evidenced by  (128% above protein needs).-Revised   GOAL:   Patient will meet greater than or equal to 90% of their needs -ongoing  MONITOR:   PO intake, Weight trends, Labs, Skin, I & O's, Other (Comment) (TPN regimen)  ASSESSMENT:   65 year old male with a past medical history of hyperlipidemia, hypertension, CAD, type 2 diabetes, diabetic peripheral neuropathy, seizure disorder, polysubstance abuse and chronic pain issues (on high doses of oxycodone prior to admission) who was admitted 11/22/15 with a small bowel obstruction. Surgical consultation was obtained on admission with conservative therapy initially recommended. Patient's bowel obstruction was felt to be from adhesions and possible narcotic bowel. His NG tube was removed 11/23/15 and he seemed to be improving clinically but pain returned 11/24/15 with diet advancement to clears. Repeat films showed worsening small bowel dilatation so his NG tube was reinserted. TPN was subsequently ordered. On 11/27/15, the patient refused PICC line insertion for TPN and surgical intervention. PICC finally placed 11/28/15 with initiation of TPN.   Pt seen for TPN follow up. Pt is receiving Clinimix 5/15 @35mL /hr and 20% fat emulsion @ 55mL/hr. Pt is currently on a regular diet. Pt reports his appetite is "good". Pt states he had fish and potatoes for lunch. Pt states he is consuming his Ensures Enlives. TPN and Ensures combined provide 1878 kcal and 102 g of Protein. This exceeds his protein recommendations by 128%.   Medications reviewed. Labs reviewed; CBG 62-162, and Na, Cl, Ca low.    Diet Order:  Diet regular Room service  appropriate?: Yes; Fluid consistency:: Thin TPN (CLINIMIX-E) Adult TPN (CLINIMIX-E) Adult  Skin:  Wound (see comment) (L foot and toe DM ulcers)  Last BM:  1/27 per pt report  Height:   Ht Readings from Last 1 Encounters:  12/01/15 6\' 2"  (1.88 m)    Weight:   Wt Readings from Last 1 Encounters:  12/08/15 157 lb 10.1 oz (71.5 kg)    Ideal Body Weight:  86.36 kg (kg)  BMI:  Body mass index is 20.23 kg/(m^2).  Estimated Nutritional Needs:   Kcal:  1600-1800  Protein:  65-80 grams  Fluid:  >/= 2 L/day  EDUCATION NEEDS:   No education needs identified at this time  Raford Pitcher, Dietetic Intern Pager: [phone]

## 2015-12-22 NOTE — Progress Notes (Signed)
Hypoglycemic Event  Time: 0348 CBG: 62   Treatment: Gave 240 mL of Apple juice; Pt drank  Symptoms: Light headed and dizzy  Follow-up CBG: Time: 0428       CBG Result: 77  Possible Reasons for Event: Poor PO intake and given 4 units of insulin r/t CBG 167 @2117   Comments/MD notified: Not notified    Fleet Contras

## 2015-12-23 DIAGNOSIS — D638 Anemia in other chronic diseases classified elsewhere: Secondary | ICD-10-CM

## 2015-12-23 LAB — BASIC METABOLIC PANEL
ANION GAP: 6 (ref 5–15)
BUN: 18 mg/dL (ref 6–20)
CHLORIDE: 93 mmol/L — AB (ref 101–111)
CO2: 34 mmol/L — AB (ref 22–32)
Calcium: 8 mg/dL — ABNORMAL LOW (ref 8.9–10.3)
Creatinine, Ser: 0.71 mg/dL (ref 0.61–1.24)
GFR calc non Af Amer: >60 mL/min (ref >60)
GLUCOSE: 167 mg/dL — AB (ref 65–99)
Potassium: 4.5 mmol/L (ref 3.5–5.1)
Sodium: 133 mmol/L — ABNORMAL LOW (ref 135–145)

## 2015-12-23 LAB — CBC
HEMATOCRIT: 25.5 % — AB (ref 39.0–52.0)
HEMOGLOBIN: 8 g/dL — AB (ref 13.0–17.0)
MCH: 30.4 pg (ref 26.0–34.0)
MCHC: 31.4 g/dL (ref 30.0–36.0)
MCV: 97 fL (ref 78.0–100.0)
Platelets: ADEQUATE 10*3/uL (ref 150–400)
RBC: 2.63 MIL/uL — ABNORMAL LOW (ref 4.22–5.81)
RDW: 17.3 % — ABNORMAL HIGH (ref 11.5–15.5)
WBC: 11.6 10*3/uL — AB (ref 4.0–10.5)

## 2015-12-23 LAB — MAGNESIUM: Magnesium: 1.8 mg/dL (ref 1.7–2.4)

## 2015-12-23 LAB — GLUCOSE, CAPILLARY
GLUCOSE-CAPILLARY: 133 mg/dL — AB (ref 65–99)
GLUCOSE-CAPILLARY: 154 mg/dL — AB (ref 65–99)
Glucose-Capillary: 159 mg/dL — ABNORMAL HIGH (ref 65–99)
Glucose-Capillary: 175 mg/dL — ABNORMAL HIGH (ref 65–99)

## 2015-12-23 LAB — PHOSPHORUS: PHOSPHORUS: 3.3 mg/dL (ref 2.5–4.6)

## 2015-12-23 MED ORDER — TRACE MINERALS CR-CU-MN-SE-ZN 10-1000-500-60 MCG/ML IV SOLN
INTRAVENOUS | Status: AC
  Administered 2015-12-23: 18:00:00 via INTRAVENOUS
  Filled 2015-12-23: qty 840

## 2015-12-23 MED ORDER — FAT EMULSION 20 % IV EMUL
120.0000 mL | INTRAVENOUS | Status: AC
  Administered 2015-12-23: 120 mL via INTRAVENOUS
  Filled 2015-12-23: qty 200

## 2015-12-23 MED ORDER — MAGNESIUM SULFATE 2 GM/50ML IV SOLN
2.0000 g | Freq: Once | INTRAVENOUS | Status: AC
  Administered 2015-12-23: 2 g via INTRAVENOUS
  Filled 2015-12-23: qty 50

## 2015-12-23 NOTE — Progress Notes (Signed)
Patient ID: [name] [name], male   DOB: 07-21-1951, 65 y.o.   MRN: [id number] [facility] Surgery Progress Note  22 Days Post-Op  Subjective: Pt states appetite is the best it has been in months.  He states he is feeling a bit better.    Objective: Vital signs in last 24 hours: Temp:  [98.4 F (36.9 C)-99.9 F (37.7 C)] 98.4 F (36.9 C) (01/28 0700) Pulse Rate:  [79-85] 85 (01/28 0700) Resp:  [18] 18 (01/28 0700) BP: (128-132)/(80-82) 128/80 mmHg (01/28 0700) SpO2:  [92 %-97 %] 97 % (01/28 0700) Last BM Date: 12/22/15  Intake/Output from previous day: 01/27 0701 - 01/28 0700 In: 460 [P.O.:120; TPN:320] Out: 2205 [Urine:2100; Drains:105] Intake/Output this shift:    PE: Gen: Alert, NAD, pleasant Abd: Soft, NT/ND, dressing intact, gravity bag with more seropurulent drainage, bulb drain with purulent drainage Ext: RUE quite edematous, rest of extremities with more minor swelling  Lab Results:   Recent Labs  12/22/15 0553 12/23/15 0320  WBC 12.7* 11.6*  HGB 7.8* 8.0*  HCT 24.7* 25.5*  PLT 263 PLATELET CLUMPS NOTED ON SMEAR, COUNT APPEARS ADEQUATE   BMET  Recent Labs  12/22/15 0553 12/23/15 0320  NA 132* 133*  K 4.3 4.5  CL 93* 93*  CO2 33* 34*  GLUCOSE 72 167*  BUN 17 18  CREATININE 0.69 0.71  CALCIUM 8.0* 8.0*   PT/INR No results for input(s): LABPROT, INR in the last 72 hours. CMP     Component Value Date/Time   NA 133* 12/23/2015 0320   K 4.5 12/23/2015 0320   CL 93* 12/23/2015 0320   CO2 34* 12/23/2015 0320   GLUCOSE 167* 12/23/2015 0320   BUN 18 12/23/2015 0320   CREATININE 0.71 12/23/2015 0320   CALCIUM 8.0* 12/23/2015 0320   PROT 6.2* 12/21/2015 0435   ALBUMIN 1.8* 12/21/2015 0435   AST 23 12/21/2015 0435   ALT 12* 12/21/2015 0435   ALKPHOS 112 12/21/2015 0435   BILITOT 0.7 12/21/2015 0435   GFRNONAA >60 12/23/2015 0320   GFRAA >60 12/23/2015 0320   Lipase     Component Value Date/Time   LIPASE 21 11/21/2015 1621        Studies/Results: No results found.  Anti-infectives: Anti-infectives    Start     Dose/Rate Route Frequency Ordered Stop   12/15/15 0800  metroNIDAZOLE (FLAGYL) tablet 500 mg  Status:  Discontinued     500 mg Oral Every 6 hours 12/15/15 0714 12/20/15 1055   12/08/15 0800  metroNIDAZOLE (FLAGYL) IVPB 500 mg  Status:  Discontinued     500 mg 100 mL/hr over 60 Minutes Intravenous Every 6 hours 12/08/15 0737 12/15/15 0714   12/03/15 1800  vancomycin (VANCOCIN) IVPB 750 mg/150 ml premix  Status:  Discontinued     750 mg 150 mL/hr over 60 Minutes Intravenous Every 24 hours 12/03/15 1715 12/04/15 0911   12/02/15 1800  ceFEPIme (MAXIPIME) 1 g in dextrose 5 % 50 mL IVPB     1 g 100 mL/hr over 30 Minutes Intravenous Every 12 hours 12/02/15 1014 12/09/15 1759   12/02/15 0600  vancomycin (VANCOCIN) IVPB 1000 mg/200 mL premix  Status:  Discontinued     1,000 mg 200 mL/hr over 60 Minutes Intravenous 3 times per day 12/02/15 0547 12/02/15 0954   12/01/15 2200  ceFEPIme (MAXIPIME) 1 g in dextrose 5 % 50 mL IVPB  Status:  Discontinued     1 g 100 mL/hr over 30 Minutes Intravenous 3 times  per day 12/01/15 1455 12/02/15 1014   11/30/15 1400  ceFEPIme (MAXIPIME) 1 g in dextrose 5 % 50 mL IVPB  Status:  Discontinued     1 g 100 mL/hr over 30 Minutes Intravenous 3 times per day 11/30/15 0913 12/01/15 1455   11/30/15 1000  metroNIDAZOLE (FLAGYL) IVPB 500 mg     500 mg 100 mL/hr over 60 Minutes Intravenous Every 8 hours 11/30/15 0858 12/07/15 0226   11/30/15 0645  vancomycin (VANCOCIN) IVPB 750 mg/150 ml premix  Status:  Discontinued     750 mg 150 mL/hr over 60 Minutes Intravenous 3 times per day 11/30/15 0630 12/02/15 0547   11/30/15 0645  imipenem-cilastatin (PRIMAXIN) 500 mg in sodium chloride 0.9 % 100 mL IVPB  Status:  Discontinued     500 mg 200 mL/hr over 30 Minutes Intravenous 3 times per day 11/30/15 0630 11/30/15 0859       Assessment/Plan POD #22 exlap, and repair of SB  perforation in setting of SBO--Dr. Hassell Done 12/01/15 -Pulmonary toilet, mobilize  -BID wet to dry dressing changes to open wound/retention sutures -Robaxin, no Toradol due to bleeding -PRN oxyIR, wean dilaudid IV (per [place] narcotic database gets 15mg  oxyIR tablets #270 per month)  Post op intra-abdominal abscesses -RUQ drain placement 1/20 Dr. Vernard Gambles  -LUQ bulb drain in place with purulent drainage -Repeat CT per IR's recommendations - will wait for output to decrease more ABL Anemia - stable. Post op ileus-resolved ID-IR abscess culture NGTD final RUE DVT-SCD's, heparin gtt on hold due to bleeding, pending filter per primary PCM-severe protein calorie malnutrition.  Protein supplements.  TNA.  Don't see calorie count.   FEN-reg diet as tolerated, cal count Dispo-After results of CT scan we can decide about discontinuing drains, we can work towards getting him to SNF if abscesses are improved, and he becomes medically stable  Will follow up with Dr. Hassell Done as an outpatient.    LOS: 32 days    Randa Riss 12/23/2015, 8:32 AM

## 2015-12-23 NOTE — Progress Notes (Signed)
PARENTERAL NUTRITION CONSULT NOTE - FOLLOW-UP  Pharmacy Consult for TPN Indication: Bowel obstruction, post op ileus  Allergies  Allergen Reactions  . Zetia [Ezetimibe] Anaphylaxis and Swelling    Tongue and throat   . Atorvastatin Other (See Comments)    Malaise & muscle weakness  . Penicillins Other (See Comments)    Unknown.  Tolerates cefepime  . Pravastatin Sodium     Pravastatin 40 mg qday and 40 mg q M/W/F caused muscle aches  . Lisinopril Rash  . Promethazine Rash   Patient Measurements: Height: 6\' 2"  (188 cm) Weight: 157 lb 10.1 oz (71.5 kg) IBW/kg (Calculated) : 82.2 Usual Weight: 137-140 lbs (62.2-63.6 kg)  Vital Signs: Temp: 98.4 F (36.9 C) (01/28 0700) Temp Source: Oral (01/28 0700) BP: 128/80 mmHg (01/28 0700) Pulse Rate: 85 (01/28 0700) Intake/Output from previous day: 01/27 0701 - 01/28 0700 In: 460 [P.O.:120; TPN:320] Out: 2205 [Urine:2100; Drains:105] Intake/Output from this shift:   Labs:  Recent Labs  12/21/15 0435 12/22/15 0553 12/23/15 0320  WBC 11.5* 12.7* 11.6*  HGB 8.4* 7.8* 8.0*  HCT 26.6* 24.7* 25.5*  PLT PLATELET CLUMPS NOTED ON SMEAR, COUNT APPEARS ADEQUATE 263 PLATELET CLUMPS NOTED ON SMEAR, COUNT APPEARS ADEQUATE    Recent Labs  12/21/15 0435 12/22/15 0553 12/23/15 0320  NA 133* 132* 133*  K 4.5 4.3 4.5  CL 95* 93* 93*  CO2 34* 33* 34*  GLUCOSE 85 72 167*  BUN 18 17 18   CREATININE 0.73 0.69 0.71  CALCIUM 8.0* 8.0* 8.0*  MG 1.5* 1.7 1.8  PHOS 3.4  --  3.3  PROT 6.2*  --   --   ALBUMIN 1.8*  --   --   AST 23  --   --   ALT 12*  --   --   ALKPHOS 112  --   --   BILITOT 0.7  --   --   Corr Ca 9.8 Estimated Creatinine Clearance: 95.6 mL/min (by C-G formula based on Cr of 0.71).    Recent Labs  12/22/15 1641 12/22/15 2150 12/23/15 0728  GLUCAP 83 167* 133*  1/27 0348: CBG 62     Medical History: Past Medical History  Diagnosis Date  . Hypertension   . CAD (coronary artery disease)   . PVD (peripheral  vascular disease) ([place])   . MI (myocardial infarction) ([place])   . HLD (hyperlipidemia)   . Polysubstance abuse   . Acute kidney injury ([place])   . Anemia    Medications:  Infusions:  . Marland KitchenTPN (CLINIMIX-E) Adult 35 mL/hr at 12/22/15 1754   And  . fat emulsion 120 mL (12/22/15 1755)   Insulin Requirements in the past 24 hours: ~ 4 units SSI/24h +  10 units Regular Insulin in TPN/24h  Current Nutrition:  Regular diet + Ensure TID (not consistently taking) Clinimix-E 5/15 at 35 mL/hr Fat Emulsion 20% at 5 mL/hr  IVF: none  Central access: PICC placed 1/3 TPN start date: 1/3  ASSESSMENT  HPI: [name] [name] presented to ED on 12/27 with abdominal pain.  PMH includes HTN, HLD, PVD - s/p fem bypass, CAD s/p NSTEMI 2014, cardiomyopathy, DM, diabetic peripheral neuropathy, seizures, and chronic pain on chronic opioids.  Surgical history significant for open AAA repair.  CT shows partial SBO, likely adhesions.  Patient refused surgery, until 1/6 s/p repair of perforated viscus.  Plan for conservative management with TPN, bowel rest.   Significant events:  1/1: TPN per pharmacy ordered - unable to start without central access.  1/3: PICC line placed; TPN started 1/5: Acute encephalopathy with fevers (102F) overnight- sepsis protocol initiated.  Broad-spectrum abx started.  1/6: Consented to laparotomy w/ findings of perforated viscus and peritonitis. Txfer to ICU   1/7:  AKI 1/10: NGT removed 1/11: started clear liquid diet 1/13 NPO again for CT guided drain placement for perihepatic fluid collection suspicious for abscess. FL diet post-procedure 1/16: per CCS, no bowel sounds noted today, patient needs to be walked as much as possible 1/18: started full liquid diet, tolerating small amounts. AKI resolved. 1/19: IR: RLQ and RUO drains removed, LUQ drain repositioned 1/22: advanced diet to regular on  1/21 1/23 drains continue to put out yellow purulent drainage 1/24 begin wean of TPN to encourage PO appetite  1/25: 48 hr calorie count ordered.  Plan is to re-CT once drain output 30-50 ml or less 1/27: mild hypoglycemic episode (CBG 62)  Today 12/23/2015:   Glucose (goal <150): 70-167 (no hypoglycemic events noted after start of new TPN bag with reduced insulin content started on 1/28). Hx DM on glipizide PTA.   Electrolytes - Mg 1.8; previously ran low for several days despite IV supplementation.  K, Phos WNL. Cl low.  Na slightly low. CoCa WNL  Renal -  SCr remains WNL  LFTs - all below ULN 1/26  TGs -  remain wnl 1/23  Prealbumin - consistently low, most recently 3.0 (1/23)  Dietary intake remains suboptimal but improving-- eating 75-90% of meals.  RD following.  NUTRITIONAL GOALS                                                                                             RD recs: 1600-1800 Kcal/day, 65-80 g protein/day, > 2 L fluid/day  Clinimix E 5/15 at a goal rate of 65 ml/hr + 20% fat emulsion at 10 ml/hr provides: 78 g/day protein, 1588 Kcal/day.  PLAN                                                                                                Continue TPN at 1/2 rate per surgery in hopes of stimulating appetite:  Clinimix E 5/15 at 35 ml/hr   20% Fat Emulsion at 5 mL/hr  Continue regular insulin in TPN to 10 units / 24 hr  continue SSI insulin moderate scale coverage  Continue Standard MVI, MTE in TPN bag.  Continue resistant SSI scale with meals and at bedtime.  Magnesium sulfate 2g IV x 1 today.    Recheck magnesium tomorrow.  Routine TPN labs Mondays and Thursdays.  Follow PO intake. Consider discontinuing TPN once patient consistently getting at least 60% of calorie needs from oral intake.   Dia Sitter, PharmD, BCPS 12/23/2015 8:25 AM

## 2015-12-23 NOTE — Progress Notes (Addendum)
Patient ID: [name] [name], male   DOB: 30-Oct-1951, 65 y.o.   MRN: [id number] [name] PROGRESS NOTE  ARMANI DAIGRE [id number] DOB: June 28, 1951 DOA: 11/21/2015 PCP: Marijean Bravo, MD  Brief narrative:    65 year old very pleasant male with a past medical history of hyperlipidemia, hypertension, CAD, type 2 diabetes, diabetic peripheral neuropathy, seizure disorder, polysubstance abuse and chronic pain issues (on high doses of oxycodone prior to admission) who was admitted 11/22/15 with a small bowel obstruction. Surgical consultation was obtained on admission with conservative therapy initially recommended. Patient's bowel obstruction was felt to be from adhesions and possible narcotic bowel. His NG tube was removed 11/23/15 and he seemed to be improving clinically but pain returned 11/24/15 with diet advancement to clears. Repeat films showed worsening small bowel dilatation so his NG tube was reinserted. TPN was subsequently ordered. On 11/27/15, the patient refused PICC line insertion for TPN and surgical intervention. PICC finally placed 11/28/15 with initiation of TPN.   Further, patient has developed a right upper extremity DVT and currently is on IV heparin.  On 11/30/15, the patient's condition deteriorated acutely and he was found to be confused and restless. Rapid response was called. He was febrile and tachycardic with an elevated lactate level. Due to concerns for sepsis, broad-spectrum antibiotics were initiated with vancomycin and Primaxin which was switched to cefepime given his seizure history. Patient uderwent exploratory laparotomy on 12/01/15 with findings of perforated viscus with peritoneal contamination/peritonitis. He remained intubated postoperatively and was under the care of the critical care team. He self extubated 12/02/15. Surgery continues to follow, patient has an open abdominal wound. He continues to have an ileus and is on TNA at this time. [name]  assumed care 12/05/15.  Transferred to telemetry floor 12/08/2015.  Barrier to discharge: Slow improvement, ongoing pain, on TNA. Unable to predict when he will be ready for discharge but possible early next week. Chest pain overnight 1/20 with slight bump in troponin but also noted drop in Hg as well from 8.5 --> 7.7 so suspect demand ischemia from blood loss.    Assessment/Plan:     Principal problem: SBO w/ perforated viscous and resultant peritonitis / leukocytosis / sepsis / post op ileus  - S/p abdominal fluid collection drainages 1/13 with removal of RLQ drain 1/20, repositioning of LUQ drain 1/20 and placement of new right subphrenic drain 1/20 - Management per surgery  - Plan to repeat CT abd per IR team, possibly remove drain later this week   Active problems:  Chest pain 1/20 secondary to demand ischemia - Demand ischemia from blood loss - No chest pain   Hypomagnesemia - Due to poor nutritional status - Supplemented   Severe protein calorie malnutrition - In the context of GI related issues - On TNA  Acute upper extremity DVT - US DVT right distal axillary vein, subclavian vein.  - Stopped heparin drip due to drop in Hg 1/23 - monitor for bleed - IVC filters not placed in arm  Post operative respiratory failure with hypoxemia - Patient required ventilatory support after exploratory laparotomy 12/01/2015.  - Patient self extubated 12/02/2015 - Respiratory status has improved with Lasix 80 mg IV given 12/05/2015  Acute Encephalopathy in setting of sepsis / history of seizure disorder - EEG done 11/30/2015 showed no acute seizures - Load with Keppra if seizures occur but so far no seizures at all  Acute kidney injury - In the setting of sepsis and small bowel obstruction with perforated  viscus - Creatinine normalized with hydration  Anemia of chronic disease / thrombocytopenia - Related to history of substance and alcohol use  - Pt transfused two units  PRBC 1/23, one unit 1/24  Controlled, diabetes mellitus type 2 with peripheral circulatory complications without long-term insulin use - Continue sliding scale insulin for now   Left lower extremity wound / S/p left fem pop bypass w/ non-healing ulcer - Per WOC assessment   DVT Prophylaxis  - SCD's bilaterally   Code Status: Full.  Family Communication:  Family not at the bedside  Disposition Plan: to SNF once abscesses improved.   IV access:  Peripheral IV  Procedures and diagnostic studies from 12/05/2015:  Ct Abdomen Pelvis Wo Contrast 12/07/2015 1. There is small bilateral pleural effusion right greater than left. Significant atelectasis or infiltrate in right lower lobe posteriorly. There is superimposed mild interstitial prominence bilateral lower lobes probable mild interstitial edema. 2. There is moderate perihepatic and perisplenic ascites. Moderate ascites noted bilateral paracolic gutters. Please note there is some loculation of perihepatic ascites with mild mass effect on the liver contour please see axial images 32 and 15. Peritoneal inflammation or infection cannot be excluded. 3. No hydronephrosis or hydroureter. Stable bilateral renal cysts. 4. Postsurgical changes are noted anterior abdominal wall. Incomplete healed/open subcutaneous midline anterior abdominal wound. 5. Mild distended small bowel loops containing oral contrast material and some air-fluid levels. There is no transition point in caliber of small bowel. Findings most likely due to significant ileus or residual partial small bowel obstruction. There is some oral contrast material noted within distal colon. 6. Moderate pelvic ascites. Moderate gas noted within mid sigmoid colon probable mild ileus. 7. Significant anasarca infiltration of subcutaneous fat abdominal and pelvic wall. 8. 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 Electronically Signed By: Lahoma Crocker M.D. On: 12/07/2015 15:18   Dg  Chest Port 1 View 12/06/2015 Stable bilateral pulmonary edema, with right basilar subsegmental atelectasis and associated pleural effusion. Interval placement of left internal jugular catheter with distal tip in SVC. No pneumothorax is noted. Electronically Signed By: Marijo Conception, M.D. On: 12/06/2015 16:10   Dg Chest Port 1 View 12/05/2015 Worsening of pulmonary interstitial infiltrates bilaterally. This may reflect pulmonary edema of cardiac or noncardiac cause or could reflect pneumonia. There are small bilateral pleural effusions.   Medical Consultants:  Surgery  CCM transfer care to [facility] PCT 12/08/2015 IR  Other Consultants:  Nutrition PT  IAnti-Infectives:   Flagyl 11-30-2015 --> stopped Cefepime 12-02-2015 --> 12/10/2015    Leisa Lenz, MD  [name] Pager [phone]  Time spent in minutes: 25 minutes  If 7PM-7AM, please contact night-coverage [email or web address] Password [facility] 12/23/2015, 2:57 PM   LOS: 32 days    HPI/Subjective: No acute overnight events. No respiratory distress.  Objective: Filed Vitals:   12/22/15 0942 12/22/15 2056 12/23/15 0700 12/23/15 1400  BP:  132/82 128/80 124/79  Pulse:  79 85 87  Temp:  99.9 F (37.7 C) 98.4 F (36.9 C) 98.4 F (36.9 C)  TempSrc:  Oral Oral Oral  Resp:  18 18 18   Height:      Weight:      SpO2: 93% 92% 97% 97%    Intake/Output Summary (Last 24 hours) at 12/23/15 1457 Last data filed at 12/23/15 1120  Gross per 24 hour  Intake     30 ml  Output   1855 ml  Net  -1825 ml    Exam:   General:  Pt  is alert, not in acute distress  Cardiovascular: Regular rate and rhythm, S1/S2 (+)  Respiratory: Clear to auscultation bilaterally, no wheezing, no crackles, no rhonchi  Abdomen: tender in mid abd, (+) BS  Extremities: No edema, pulses DP and PT palpable bilaterally  Neuro: Grossly nonfocal  Data Reviewed: Basic Metabolic Panel:  Recent Labs Lab 12/17/15 0747 12/18/15 0630  12/19/15 0457 12/20/15 0440 12/21/15 0435 12/22/15 0553 12/23/15 0320  NA 136 133* 136 135 133* 132* 133*  K 4.1 4.5 4.6 4.7 4.5 4.3 4.5  CL 97* 97* 97* 96* 95* 93* 93*  CO2 31 30 34* 33* 34* 33* 34*  GLUCOSE 190* 231* 198* 139* 85 72 167*  BUN 19 22* 23* 22* 18 17 18   CREATININE 0.90 0.95 0.88 0.86 0.73 0.69 0.71  CALCIUM 8.1* 8.2* 8.2* 8.3* 8.0* 8.0* 8.0*  MG 1.5* 1.8 1.6* 1.5* 1.5* 1.7 1.8  PHOS 3.3 3.1  --  3.3 3.4  --  3.3   Liver Function Tests:  Recent Labs Lab 12/18/15 0630 12/21/15 0435  AST 21 23  ALT 11* 12*  ALKPHOS 111 112  BILITOT 0.4 0.7  PROT 6.7 6.2*  ALBUMIN 1.8* 1.8*   No results for input(s): LIPASE, AMYLASE in the last 168 hours. No results for input(s): AMMONIA in the last 168 hours. CBC:  Recent Labs Lab 12/18/15 0630 12/19/15 0457 12/19/15 1900 12/20/15 0440 12/21/15 0435 12/22/15 0553 12/23/15 0320  WBC 11.2* 9.3  --  10.6* 11.5* 12.7* 11.6*  NEUTROABS 7.0  --   --   --   --   --   --   HGB 5.1* 6.9* 8.4* 8.2* 8.4* 7.8* 8.0*  HCT 15.6* 20.6* 25.8* 25.0* 26.6* 24.7* 25.5*  MCV 86.7 88.0  --  92.3 95.3 95.7 97.0  PLT 377 269  --  283 PLATELET CLUMPS NOTED ON SMEAR, COUNT APPEARS ADEQUATE 263 PLATELET CLUMPS NOTED ON SMEAR, COUNT APPEARS ADEQUATE   Cardiac Enzymes: No results for input(s): CKTOTAL, CKMB, CKMBINDEX, TROPONINI in the last 168 hours. BNP: Invalid input(s): POCBNP CBG:  Recent Labs Lab 12/22/15 1213 12/22/15 1641 12/22/15 2150 12/23/15 0728 12/23/15 1343  GLUCAP 162* 83 167* 133* 154*    Recent Results (from the past 240 hour(s))  Culture, routine-abscess     Status: None   Collection Time: 12/15/15 11:12 AM  Result Value Ref Range Status   Specimen Description ABSCESS CT RUQ DRAIN  Final   Special Requests Normal  Final   Gram Stain   Final    ABUNDANT WBC PRESENT,BOTH PMN AND MONONUCLEAR NO SQUAMOUS EPITHELIAL CELLS SEEN NO ORGANISMS SEEN Performed at [facility]    Culture   Final    NO  GROWTH 2 DAYS Performed at [facility]    Report Status 12/18/2015 FINAL  Final  Culture, routine-abscess     Status: None   Collection Time: 12/15/15  1:48 PM  Result Value Ref Range Status   Specimen Description JP DRAINAGE  Final   Special Requests NONE  Final   Gram Stain   Final    ABUNDANT WBC PRESENT,BOTH PMN AND MONONUCLEAR NO SQUAMOUS EPITHELIAL CELLS SEEN NO ORGANISMS SEEN Performed at [facility]    Culture   Final    NO GROWTH 2 DAYS Performed at [facility]    Report Status 12/18/2015 FINAL  Final     Scheduled Meds: . sodium chloride   Intravenous Once  . antiseptic oral rinse  7 mL Mouth Rinse  [id number]  . bacitracin   Topical Daily  . chlorhexidine  15 mL Mouth Rinse BID  . collagenase  1 application Topical Daily  . feeding supplement (ENSURE ENLIVE)  237 mL Oral TID BM  . fentaNYL (SUBLIMAZE) injection  50 mcg Intravenous Once  . insulin aspart  0-15 Units Subcutaneous TID WC  . insulin aspart  0-5 Units Subcutaneous QHS  . lip balm  1 application Topical BID  . methocarbamol  1,000 mg Oral TID  . sodium chloride  3 mL Intravenous Q12H  . thiamine  100 mg Oral Daily   Continuous Infusions: . Marland KitchenTPN (CLINIMIX-E) Adult 35 mL/hr at 12/22/15 1754   And  . fat emulsion 120 mL (12/22/15 1755)  . Marland KitchenTPN (CLINIMIX-E) Adult     And  . fat emulsion

## 2015-12-24 ENCOUNTER — Encounter (HOSPITAL_COMMUNITY): Payer: Self-pay | Admitting: Internal Medicine

## 2015-12-24 DIAGNOSIS — D72829 Elevated white blood cell count, unspecified: Secondary | ICD-10-CM

## 2015-12-24 LAB — CBC
HCT: 26.9 % — ABNORMAL LOW (ref 39.0–52.0)
HEMOGLOBIN: 8.4 g/dL — AB (ref 13.0–17.0)
MCH: 30.2 pg (ref 26.0–34.0)
MCHC: 31.2 g/dL (ref 30.0–36.0)
MCV: 96.8 fL (ref 78.0–100.0)
PLATELETS: 236 10*3/uL (ref 150–400)
RBC: 2.78 MIL/uL — ABNORMAL LOW (ref 4.22–5.81)
RDW: 16.5 % — AB (ref 11.5–15.5)
WBC: 11.6 10*3/uL — ABNORMAL HIGH (ref 4.0–10.5)

## 2015-12-24 LAB — GLUCOSE, CAPILLARY
Glucose-Capillary: 174 mg/dL — ABNORMAL HIGH (ref 65–99)
Glucose-Capillary: 161 mg/dL — ABNORMAL HIGH (ref 65–99)
Glucose-Capillary: 165 mg/dL — ABNORMAL HIGH (ref 65–99)
Glucose-Capillary: 217 mg/dL — ABNORMAL HIGH (ref 65–99)

## 2015-12-24 LAB — MAGNESIUM: MAGNESIUM: 1.5 mg/dL — AB (ref 1.7–2.4)

## 2015-12-24 MED ORDER — MAGNESIUM SULFATE 2 GM/50ML IV SOLN
2.0000 g | Freq: Once | INTRAVENOUS | Status: AC
  Administered 2015-12-24: 2 g via INTRAVENOUS
  Filled 2015-12-24: qty 50

## 2015-12-24 MED ORDER — TRACE MINERALS CR-CU-MN-SE-ZN 10-1000-500-60 MCG/ML IV SOLN
INTRAVENOUS | Status: AC
  Administered 2015-12-24: 17:00:00 via INTRAVENOUS
  Filled 2015-12-24: qty 840

## 2015-12-24 MED ORDER — FAT EMULSION 20 % IV EMUL
120.0000 mL | INTRAVENOUS | Status: AC
  Administered 2015-12-24: 120 mL via INTRAVENOUS
  Filled 2015-12-24: qty 200

## 2015-12-24 NOTE — Progress Notes (Signed)
Patient ID: [name] [name], male   DOB: 1951/04/01, 65 y.o.   MRN: [id number] [name] PROGRESS NOTE  AMIIR [name] [id number] DOB: 1951-01-12 DOA: 11/21/2015 65 y.o. PCP: Marijean Bravo, MD  Brief narrative:    65 year old very pleasant male with a past medical history of hyperlipidemia, hypertension, CAD, type 2 diabetes, diabetic peripheral neuropathy, seizure disorder, polysubstance abuse and chronic pain issues (on high doses of oxycodone prior to admission) who was admitted 11/22/15 with a small bowel obstruction. Surgical consultation was obtained on admission with conservative therapy initially recommended. Patient's bowel obstruction was felt to be from adhesions and possible narcotic bowel. His NG tube was removed 11/23/15 and he seemed to be improving clinically but pain returned 11/24/15 with diet advancement to clears. Repeat films showed worsening small bowel dilatation so his NG tube was reinserted. TPN was subsequently ordered. On 11/27/15, the patient refused PICC line insertion for TPN and surgical intervention. PICC finally placed 11/28/15 with initiation of TPN.   Further, patient has developed a right upper extremity DVT and currently is on IV heparin.  On 11/30/15, the patient's condition deteriorated acutely and he was found to be confused and restless. Rapid response was called. He was febrile and tachycardic with an elevated lactate level. Due to concerns for sepsis, broad-spectrum antibiotics were initiated with vancomycin and Primaxin which was switched to cefepime given his seizure history. Patient uderwent exploratory laparotomy on 12/01/15 with findings of perforated viscus with peritoneal contamination/peritonitis. He remained intubated postoperatively and was under the care of the critical care team. He self extubated 12/02/15. Surgery continues to follow, patient has an open abdominal wound. He continues to have an ileus and is on TNA at this time. [name]  assumed care 12/05/15.  Transferred to telemetry floor 12/08/2015.  Plan to repeat CT scan early next week to assess if any other drain can be removed.   Assessment/Plan:    Principal problem: Sepsis secondary to SBO w/ perforated viscous and resultant peritonitis / Postoperative ileus / leukocytosis  - S/p abdominal fluid collection drainages 1/13 with removal of RLQ drain 1/20, repositioning of LUQ drain 1/20 and placement of new right subphrenic drain 1/20 - Management per surgery  - S/P removal of RLQ drain 1/20, repositioning of LUQ drain 1/20 and placement of new right subphrenic drain 1/20 - Plan to repeat CT abd per IR team, possibly remove drain early next week  - WBC count stable at 11.6   Active problems:  Chest pain 1/20 secondary to demand ischemia - Demand ischemia from blood loss - No chest pain   Hypomagnesemia - Due to nutritional feeds, poor po intake  - Supplemented   Severe protein calorie malnutrition  - In the context of GI related issues - On TNA - Pt malnourished but not underweight based on BMI - Body mass index is 20.23 kg/(m^2).  Acute upper extremity DVT / Acute blood loss anemia  - US DVT right distal axillary vein, subclavian vein.  - Stopped heparin drip due to drop in Hg 1/23 - Pt transfused two units PRBC 1/23, one unit 1/24 - IVC filters not placed in arm  Post operative respiratory failure with hypoxemia - Patient required ventilatory support after exploratory laparotomy 12/01/2015.  - Patient self extubated 12/02/2015 - Respiratory status has improved with Lasix 80 mg IV given 12/05/2015  Acute Encephalopathy in setting of sepsis / history of seizure disorder - EEG done 11/30/2015 showed no acute seizures - Load with Keppra if seizures occur but  so far no seizures at all  Acute kidney injury - In the setting of sepsis and small bowel obstruction with perforated viscus - Creatinine normalized with fluids   Controlled, diabetes  mellitus type 2 with peripheral circulatory complications without long-term insulin use - Continue sliding scale insulin for now  - CBG's in past 24 hours: 159, 175, 174  Left lower extremity wound / S/p left fem pop bypass w/ non-healing ulcer - Per WOC assessment   DVT Prophylaxis  - SCD's bilaterally in hospital   Code Status: Full.  Family Communication:  Family not at the bedside  Disposition Plan: to SNF once abscesses improved.   IV access:  Peripheral IV  Procedures and diagnostic studies from 12/05/2015:  Ct Abdomen Pelvis Wo Contrast 12/07/2015 1. There is small bilateral pleural effusion right greater than left. Significant atelectasis or infiltrate in right lower lobe posteriorly. There is superimposed mild interstitial prominence bilateral lower lobes probable mild interstitial edema. 2. There is moderate perihepatic and perisplenic ascites. Moderate ascites noted bilateral paracolic gutters. Please note there is some loculation of perihepatic ascites with mild mass effect on the liver contour please see axial images 32 and 15. Peritoneal inflammation or infection cannot be excluded. 3. No hydronephrosis or hydroureter. Stable bilateral renal cysts. 4. Postsurgical changes are noted anterior abdominal wall. Incomplete healed/open subcutaneous midline anterior abdominal wound. 5. Mild distended small bowel loops containing oral contrast material and some air-fluid levels. There is no transition point in caliber of small bowel. Findings most likely due to significant ileus or residual partial small bowel obstruction. There is some oral contrast material noted within distal colon. 6. Moderate pelvic ascites. Moderate gas noted within mid sigmoid colon probable mild ileus. 7. Significant anasarca infiltration of subcutaneous fat abdominal and pelvic wall. 8. 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 Electronically Signed By: Lahoma Crocker M.D. On: 12/07/2015 15:18   Dg  Chest Port 1 View 12/06/2015 Stable bilateral pulmonary edema, with right basilar subsegmental atelectasis and associated pleural effusion. Interval placement of left internal jugular catheter with distal tip in SVC. No pneumothorax is noted. Electronically Signed By: Marijo Conception, M.D. On: 12/06/2015 16:10   Dg Chest Port 1 View 12/05/2015 Worsening of pulmonary interstitial infiltrates bilaterally. This may reflect pulmonary edema of cardiac or noncardiac cause or could reflect pneumonia. There are small bilateral pleural effusions.   Medical Consultants:  Surgery  CCM transfer care to [facility] 1[facility]10 PCT 12/08/2015 IR  Other Consultants:  Nutrition PT  IAnti-Infectives:   Flagyl 11-30-2015 --> stopped Cefepime 12-02-2015 --> 12/10/2015    Leisa Lenz, MD  [name] Pager [phone]  Time spent in minutes: 15 minutes  If 7PM-7AM, please contact night-coverage [email or web address] Password [facility] 12/24/2015, 2:39 PM   LOS: 33 days    HPI/Subjective: No acute overnight events. Pain about the same from day to day.   Objective: Filed Vitals:   12/23/15 0700 12/23/15 1400 12/23/15 2208 12/24/15 0500  BP: 128/80 124/79 124/75 124/84  Pulse: 85 87 95 88  Temp: 98.4 F (36.9 C) 98.4 F (36.9 C) 99.8 F (37.7 C) 98.7 F (37.1 C)  TempSrc: Oral Oral Oral Oral  Resp: 18 18 18 16   Height:      Weight:      SpO2: 97% 97% 97% 99%    Intake/Output Summary (Last 24 hours) at 12/24/15 1439 Last data filed at 12/24/15 0903  Gross per 24 hour  Intake   1502 ml  Output   1085 ml  Net    417 ml    Exam:   General:  Pt is sleeping, no distress   Cardiovascular: Rate controlled, appreciate S1, S2   Respiratory: No wheezing, no crackles, no rhonchi  Abdomen: JP drains, (+) BS  Extremities: No swelling, palpable pulses   Neuro: Nonfocal  Data Reviewed: Basic Metabolic Panel:  Recent Labs Lab 12/18/15 0630 12/19/15 0457 12/20/15 0440 12/21/15 0435  12/22/15 0553 12/23/15 0320 12/24/15 0516  NA 133* 136 135 133* 132* 133*  --   K 4.5 4.6 4.7 4.5 4.3 4.5  --   CL 97* 97* 96* 95* 93* 93*  --   CO2 30 34* 33* 34* 33* 34*  --   GLUCOSE 231* 198* 139* 85 72 167*  --   BUN 22* 23* 22* 18 17 18   --   CREATININE 0.95 0.88 0.86 0.73 0.69 0.71  --   CALCIUM 8.2* 8.2* 8.3* 8.0* 8.0* 8.0*  --   MG 1.8 1.6* 1.5* 1.5* 1.7 1.8 1.5*  PHOS 3.1  --  3.3 3.4  --  3.3  --    Liver Function Tests:  Recent Labs Lab 12/18/15 0630 12/21/15 0435  AST 21 23  ALT 11* 12*  ALKPHOS 111 112  BILITOT 0.4 0.7  PROT 6.7 6.2*  ALBUMIN 1.8* 1.8*   No results for input(s): LIPASE, AMYLASE in the last 168 hours. No results for input(s): AMMONIA in the last 168 hours. CBC:  Recent Labs Lab 12/18/15 0630  12/20/15 0440 12/21/15 0435 12/22/15 0553 12/23/15 0320 12/24/15 0516  WBC 11.2*  < > 10.6* 11.5* 12.7* 11.6* 11.6*  NEUTROABS 7.0  --   --   --   --   --   --   HGB 5.1*  < > 8.2* 8.4* 7.8* 8.0* 8.4*  HCT 15.6*  < > 25.0* 26.6* 24.7* 25.5* 26.9*  MCV 86.7  < > 92.3 95.3 95.7 97.0 96.8  PLT 377  < > 283 PLATELET CLUMPS NOTED ON SMEAR, COUNT APPEARS ADEQUATE 263 PLATELET CLUMPS NOTED ON SMEAR, COUNT APPEARS ADEQUATE 236  < > = values in this interval not displayed. Cardiac Enzymes: No results for input(s): CKTOTAL, CKMB, CKMBINDEX, TROPONINI in the last 168 hours. BNP: Invalid input(s): POCBNP CBG:  Recent Labs Lab 12/23/15 0728 12/23/15 1343 12/23/15 1656 12/23/15 2135 12/24/15 0742  GLUCAP 133* 154* 159* 175* 174*    Recent Results (from the past 240 hour(s))  Culture, routine-abscess     Status: None   Collection Time: 12/15/15 11:12 AM  Result Value Ref Range Status   Specimen Description ABSCESS CT RUQ DRAIN  Final   Special Requests Normal  Final   Gram Stain   Final    ABUNDANT WBC PRESENT,BOTH PMN AND MONONUCLEAR NO SQUAMOUS EPITHELIAL CELLS SEEN NO ORGANISMS SEEN Performed at [facility]    Culture   Final     NO GROWTH 2 DAYS Performed at [facility]    Report Status 12/18/2015 FINAL  Final  Culture, routine-abscess     Status: None   Collection Time: 12/15/15  1:48 PM  Result Value Ref Range Status   Specimen Description JP DRAINAGE  Final   Special Requests NONE  Final   Gram Stain   Final    ABUNDANT WBC PRESENT,BOTH PMN AND MONONUCLEAR NO SQUAMOUS EPITHELIAL CELLS SEEN NO ORGANISMS SEEN Performed at [facility]    Culture   Final    NO GROWTH 2 DAYS Performed at [facility]    Report Status 12/18/2015 FINAL  Final     Scheduled Meds: . sodium chloride   Intravenous Once  . antiseptic oral rinse  7 mL Mouth Rinse q12n4p  . bacitracin   Topical Daily  . chlorhexidine  15 mL Mouth Rinse BID  . collagenase  1 application Topical Daily  . feeding supplement (ENSURE ENLIVE)  237 mL Oral TID BM  . fentaNYL (SUBLIMAZE) injection  50 mcg Intravenous Once  . insulin aspart  0-15 Units Subcutaneous TID WC  . insulin aspart  0-5 Units Subcutaneous QHS  . lip balm  1 application Topical BID  . methocarbamol  1,000 mg Oral TID  . sodium chloride  3 mL Intravenous Q12H  . thiamine  100 mg Oral Daily   Continuous Infusions: . Marland KitchenTPN (CLINIMIX-E) Adult 35 mL/hr at 12/23/15 1749   And  . fat emulsion 120 mL (12/23/15 1748)  . Marland KitchenTPN (CLINIMIX-E) Adult     And  . fat emulsion

## 2015-12-24 NOTE — Progress Notes (Signed)
Patient ID: [name] [name], male   DOB: 01-23-1951, 65 y.o.   MRN: [id number]    Referring Physician(s): CCS  Chief Complaint: Hepatic abscess and LUQ abscess  Subjective: Feeling a bit better. Tolerating reg diet with BMs too Mild pains  Allergies: Zetia; Atorvastatin; Penicillins; Pravastatin sodium; Lisinopril; and Promethazine  Medications:  Current facility-administered medications:  .  0.9 %  sodium chloride infusion, , Intravenous, Once, Gardiner Barefoot, NP .  acetaminophen (TYLENOL) tablet 650 mg, 650 mg, Oral, Q6H PRN, Ritta Slot, NP, 650 mg at 12/24/15 0341 .  albuterol (PROVENTIL) (2.5 MG/3ML) 0.083% nebulizer solution 2.5 mg, 2.5 mg, Inhalation, Q6H PRN, Florencia Reasons, MD, 2.5 mg at 12/22/15 0941 .  alum & mag hydroxide-simeth (MAALOX/MYLANTA) 200-200-20 MG/5ML suspension 30 mL, 30 mL, Oral, Q6H PRN, Michael Boston, MD, 30 mL at 12/13/15 0312 .  antiseptic oral rinse (CPC / CETYLPYRIDINIUM CHLORIDE 0.05%) solution 7 mL, 7 mL, Mouth Rinse, q12n4p, Kara Mead V, MD, 7 mL at 12/23/15 1707 .  bacitracin ointment, , Topical, Daily, Florencia Reasons, MD .  bisacodyl (DULCOLAX) suppository 10 mg, 10 mg, Rectal, Q12H PRN, Michael Boston, MD, 10 mg at 12/20/15 1048 .  chlorhexidine (PERIDEX) 0.12 % solution 15 mL, 15 mL, Mouth Rinse, BID, Kara Mead V, MD, 15 mL at 12/24/15 1024 .  collagenase (SANTYL) ointment 1 application, 1 application, Topical, Daily, Florencia Reasons, MD, 1 application at [id number] .  diphenhydrAMINE (BENADRYL) injection 12.5-25 mg, 12.5-25 mg, Intravenous, Q6H PRN, Michael Boston, MD, 25 mg at 12/20/15 2211 .  TPN (CLINIMIX-E) Adult, , Intravenous, Continuous TPN, Last Rate: 35 mL/hr at 12/23/15 1749 **AND** fat emulsion 20 % infusion 120 mL, 120 mL, Intravenous, Continuous TPN, Anh P Pham, RPH, Last Rate: 5 mL/hr at 12/23/15 1748, 120 mL at 12/23/15 1748 .  TPN (CLINIMIX-E) Adult, , Intravenous, Continuous TPN **AND** fat emulsion 20 % infusion 120 mL, 120 mL, Intravenous,  Continuous TPN, Anh P Pham, RPH .  feeding supplement (ENSURE ENLIVE) (ENSURE ENLIVE) liquid 237 mL, 237 mL, Oral, TID BM, [employer], PA-C, 237 mL at 12/24/15 1024 .  fentaNYL (SUBLIMAZE) injection 50 mcg, 50 mcg, Intravenous, Once, Jacqulynn Cadet, MD, 50 mcg at 12/14/15 1805 .  hydrALAZINE (APRESOLINE) injection 10-20 mg, 10-20 mg, Intravenous, Q6H PRN, Donita Brooks, NP, 20 mg at 12/04/15 1823 .  HYDROmorphone (DILAUDID) injection 1-2 mg, 1-2 mg, Intravenous, Q6H PRN, Emina Riebock, NP, 2 mg at 12/24/15 1051 .  insulin aspart (novoLOG) injection 0-15 Units, 0-15 Units, Subcutaneous, TID WC, Randall K Absher, RPH, 3 Units at 12/24/15 [id number] .  insulin aspart (novoLOG) injection 0-5 Units, 0-5 Units, Subcutaneous, QHS, Randall Raliegh Ip Absher, RPH, 0 Units at 12/22/15 2208 .  lip balm (CARMEX) ointment 1 application, 1 application, Topical, BID, Michael Boston, MD, 1 application at [id number] 1025 .  magic mouthwash, 15 mL, Oral, QID PRN, Michael Boston, MD, 15 mL at 12/18/15 2309 .  menthol-cetylpyridinium (CEPACOL) lozenge 3 mg, 1 lozenge, Oral, PRN, Michael Boston, MD .  methocarbamol (ROBAXIN) tablet 1,000 mg, 1,000 mg, Oral, TID, Megan N Baird, PA-C, 1,000 mg at 12/24/15 1024 .  ondansetron (ZOFRAN) injection 4 mg, 4 mg, Intravenous, Q6H PRN, Dianne Dun, NP, 4 mg at 12/11/15 0228 .  oxyCODONE (Oxy IR/ROXICODONE) immediate release tablet 20 mg, 20 mg, Oral, Q4H PRN, Nat Christen, PA-C, 20 mg at 12/24/15 [id number] .  phenol (CHLORASEPTIC) mouth spray 2 spray, 2 spray, Mouth/Throat, PRN, Michael Boston, MD .  sodium chloride (  OCEAN) 0.65 % nasal spray 1 spray, 1 spray, Each Nare, PRN, Robbie Lis, MD, 1 spray at 12/20/15 2047 .  sodium chloride 0.9 % injection 10-40 mL, 10-40 mL, Intracatheter, PRN, Florencia Reasons, MD, 10 mL at 12/23/15 0503 .  sodium chloride 0.9 % injection 10-40 mL, 10-40 mL, Intracatheter, PRN, Robbie Lis, MD, 10 mL at 12/23/15 1749 .  sodium chloride 0.9 % injection 3 mL, 3 mL,  Intravenous, Q12H, Reubin Milan, MD, 3 mL at 12/24/15 1025 .  thiamine (VITAMIN B-1) tablet 100 mg, 100 mg, Oral, Daily, 100 mg at 12/24/15 1024 **OR** [DISCONTINUED] thiamine (B-1) injection 100 mg, 100 mg, Intravenous, Daily, Michael Boston, MD, 100 mg at 12/08/15 0908 .  triamcinolone cream (KENALOG) 0.1 % 1 application, 1 application, Topical, BID PRN, Florencia Reasons, MD   Vital Signs: BP 124/84 mmHg  Pulse 88  Temp(Src) 98.7 F (37.1 C) (Oral)  Resp 16  Ht 6\' 2"  (1.88 m)  Wt 157 lb 10.1 oz (71.5 kg)  BMI 20.23 kg/m2  SpO2 99%  Physical Exam: Abd: soft, midline wound dressed RUQ drain intact, site clean, 50cc recorded LUQ drain intact, site clean, 10cc recorded  Imaging: No results found.  Labs:  CBC:  Recent Labs  12/21/15 0435 12/22/15 0553 12/23/15 0320 12/24/15 0516  WBC 11.5* 12.7* 11.6* 11.6*  HGB 8.4* 7.8* 8.0* 8.4*  HCT 26.6* 24.7* 25.5* 26.9*  PLT PLATELET CLUMPS NOTED ON SMEAR, COUNT APPEARS ADEQUATE 263 PLATELET CLUMPS NOTED ON SMEAR, COUNT APPEARS ADEQUATE 236    COAGS:  Recent Labs  07/26/15 1415 12/06/15 0925 12/08/15 0340  INR 1.09 1.89* 1.82*  APTT 37 45*  --     BMP:  Recent Labs  12/20/15 0440 12/21/15 0435 12/22/15 0553 12/23/15 0320  NA 135 133* 132* 133*  K 4.7 4.5 4.3 4.5  CL 96* 95* 93* 93*  CO2 33* 34* 33* 34*  GLUCOSE 139* 85 72 167*  BUN 22* 18 17 18   CALCIUM 8.3* 8.0* 8.0* 8.0*  CREATININE 0.86 0.73 0.69 0.71  GFRNONAA >60 >60 >60 >60  GFRAA >60 >60 >60 >60    LIVER FUNCTION TESTS:  Recent Labs  12/11/15 0518 12/14/15 0515 12/18/15 0630 12/21/15 0435  BILITOT 0.9 0.5 0.4 0.7  AST 21 21 21 23   ALT 11* 9* 11* 12*  ALKPHOS 74 101 111 112  PROT 5.8* 6.2* 6.7 6.2*  ALBUMIN 1.9* 1.8* 1.8* 1.8*    Assessment and Plan: S/p abdominal fluid collection drainages 1/13 with removal of RLQ drain 1/20, repositioning of LUQ drain 1/20 and placement of new right subphrenic drain 1/20 -cont both drains.  For repeat CT  this week.  Electronically Signed: Ascencion Dike 12/24/2015, 12:08 PM   I spent a total of 15 Minutes at the the patient's bedside AND on the patient's hospital floor or unit, greater than 50% of which was counseling/coordinating care for hepatic and intra-abdominal abscesses, drain management.

## 2015-12-24 NOTE — Progress Notes (Signed)
Patient ID: [name] [name], male   DOB: 06/14/1951, 65 y.o.   MRN: [id number] 23 Days Post-Op  Subjective: In good spirits today.  Tolerating reg diet and having BMs.  Having some pain, abd and exts which is chronic  Objective: Vital signs in last 24 hours: Temp:  [98.4 F (36.9 C)-99.8 F (37.7 C)] 98.7 F (37.1 C) (01/29 0500) Pulse Rate:  [87-95] 88 (01/29 0500) Resp:  [16-18] 16 (01/29 0500) BP: (124)/(75-84) 124/84 mmHg (01/29 0500) SpO2:  [97 %-99 %] 99 % (01/29 0500) Last BM Date: 12/22/15  Intake/Output from previous day: 01/28 0701 - 01/29 0700 In: 1272 [TPN:1262] Out: 860 [Urine:800; Drains:60] Intake/Output this shift:    General appearance: alert, cooperative and no distress GI: normal findings: soft, non-tender and non distended, drains in RUQ and LUQ with minimal purulent drainage Incision/Wound: Clean, almost closed  Lab Results:   Recent Labs  12/23/15 0320 12/24/15 0516  WBC 11.6* 11.6*  HGB 8.0* 8.4*  HCT 25.5* 26.9*  PLT PLATELET CLUMPS NOTED ON SMEAR, COUNT APPEARS ADEQUATE 236   BMET  Recent Labs  12/22/15 0553 12/23/15 0320  NA 132* 133*  K 4.3 4.5  CL 93* 93*  CO2 33* 34*  GLUCOSE 72 167*  BUN 17 18  CREATININE 0.69 0.71  CALCIUM 8.0* 8.0*     Studies/Results: No results found.  Anti-infectives: Anti-infectives    Start     Dose/Rate Route Frequency Ordered Stop   12/15/15 0800  metroNIDAZOLE (FLAGYL) tablet 500 mg  Status:  Discontinued     500 mg Oral Every 6 hours 12/15/15 0714 12/20/15 1055   12/08/15 0800  metroNIDAZOLE (FLAGYL) IVPB 500 mg  Status:  Discontinued     500 mg 100 mL/hr over 60 Minutes Intravenous Every 6 hours 12/08/15 0737 12/15/15 0714   12/03/15 1800  vancomycin (VANCOCIN) IVPB 750 mg/150 ml premix  Status:  Discontinued     750 mg 150 mL/hr over 60 Minutes Intravenous Every 24 hours 12/03/15 1715 12/04/15 0911   12/02/15 1800  ceFEPIme (MAXIPIME) 1 g in dextrose 5 % 50 mL IVPB     1 g 100 mL/hr  over 30 Minutes Intravenous Every 12 hours 12/02/15 1014 12/09/15 1759   12/02/15 0600  vancomycin (VANCOCIN) IVPB 1000 mg/200 mL premix  Status:  Discontinued     1,000 mg 200 mL/hr over 60 Minutes Intravenous 3 times per day 12/02/15 0547 12/02/15 0954   12/01/15 2200  ceFEPIme (MAXIPIME) 1 g in dextrose 5 % 50 mL IVPB  Status:  Discontinued     1 g 100 mL/hr over 30 Minutes Intravenous 3 times per day 12/01/15 1455 12/02/15 1014   11/30/15 1400  ceFEPIme (MAXIPIME) 1 g in dextrose 5 % 50 mL IVPB  Status:  Discontinued     1 g 100 mL/hr over 30 Minutes Intravenous 3 times per day 11/30/15 0913 12/01/15 1455   11/30/15 1000  metroNIDAZOLE (FLAGYL) IVPB 500 mg     500 mg 100 mL/hr over 60 Minutes Intravenous Every 8 hours 11/30/15 0858 12/07/15 0226   11/30/15 0645  vancomycin (VANCOCIN) IVPB 750 mg/150 ml premix  Status:  Discontinued     750 mg 150 mL/hr over 60 Minutes Intravenous 3 times per day 11/30/15 0630 12/02/15 0547   11/30/15 0645  imipenem-cilastatin (PRIMAXIN) 500 mg in sodium chloride 0.9 % 100 mL IVPB  Status:  Discontinued     500 mg 200 mL/hr over 30 Minutes Intravenous 3 times per day  11/30/15 0630 11/30/15 0859      Assessment/Plan: s/p Procedure(s): EXPLORATORY LAPAROTOMY WITH RADICAL PERITONEAL DEBRIDEMENT, CLOSURE OF SMALL BOWEL PERFORATION AND LYSIS OF ADHESIONS. Continued improvement without acute issues today For CT later this week to assess for drain removal Malnutrition, on TNA, oral intake improving   LOS: 33 days    Yoshito Gaza T 12/24/2015

## 2015-12-24 NOTE — Progress Notes (Signed)
PARENTERAL NUTRITION CONSULT NOTE - FOLLOW-UP  Pharmacy Consult for TPN Indication: Bowel obstruction, post op ileus  Allergies  Allergen Reactions  . Zetia [Ezetimibe] Anaphylaxis and Swelling    Tongue and throat   . Atorvastatin Other (See Comments)    Malaise & muscle weakness  . Penicillins Other (See Comments)    Unknown.  Tolerates cefepime  . Pravastatin Sodium     Pravastatin 40 mg qday and 40 mg q M/W/F caused muscle aches  . Lisinopril Rash  . Promethazine Rash   Patient Measurements: Height: 6\' 2"  (188 cm) Weight: 157 lb [name].1 oz (71.5 kg) IBW/kg (Calculated) : 82.2 Usual Weight: 137-140 lbs (62.2-63.6 kg)  Vital Signs: Temp: 98.7 F (37.1 C) (01/29 0500) Temp Source: Oral (01/29 0500) BP: 124/84 mmHg (01/29 0500) Pulse Rate: 88 (01/29 0500) Intake/Output from previous day: 01/28 0701 - 01/29 0700 In: 1272 [TPN:1262] Out: 860 [Urine:800; Drains:60] Intake/Output from this shift:   Labs:  Recent Labs  12/22/15 0553 12/23/15 0320 12/24/15 0516  WBC 12.7* 11.6* 11.6*  HGB 7.8* 8.0* 8.4*  HCT 24.7* 25.5* 26.9*  PLT 263 PLATELET CLUMPS NOTED ON SMEAR, COUNT APPEARS ADEQUATE 236    Recent Labs  12/22/15 0553 12/23/15 0320 12/24/15 0516  NA 132* 133*  --   K 4.3 4.5  --   CL 93* 93*  --   CO2 33* 34*  --   GLUCOSE 72 167*  --   BUN 17 18  --   CREATININE 0.69 0.71  --   CALCIUM 8.0* 8.0*  --   MG 1.7 1.8 1.5*  PHOS  --  3.3  --   Corr Ca 9.8 Estimated Creatinine Clearance: 95.6 mL/min (by C-G formula based on Cr of 0.71).    Recent Labs  12/23/15 1656 12/23/15 2135 12/24/15 0742  GLUCAP 159* 175* 174*  1/27 0348: CBG 62     Medical History: Past Medical History  Diagnosis Date  . Hypertension   . CAD (coronary artery disease)   . PVD (peripheral vascular disease) ([place])   . MI (myocardial infarction) ([place])   . HLD (hyperlipidemia)   . Polysubstance abuse   . Acute kidney injury ([place])   . Anemia    Medications:  Infusions:   . Marland KitchenTPN (CLINIMIX-E) Adult 35 mL/hr at 12/23/15 1749   And  . fat emulsion 120 mL (12/23/15 1748)   Insulin Requirements in the past 24 hours: ~ 11 units SSI/24h +  [name] units Regular Insulin in TPN/24h  Current Nutrition:  Regular diet + Ensure TID ( consistently taking over the last 24 hours ) Clinimix-E 5/15 at 35 mL/hr Fat Emulsion 20% at 5 mL/hr  IVF: none  Central access: PICC placed 1/3 TPN start date: 1/3  ASSESSMENT                                                                                                          HPI: [name] [name] presented to ED on 12/27 with abdominal pain.  PMH includes HTN, HLD, PVD - s/p fem bypass, CAD  s/p NSTEMI 2014, cardiomyopathy, DM, diabetic peripheral neuropathy, seizures, and chronic pain on chronic opioids.  Surgical history significant for open AAA repair.  CT shows partial SBO, likely adhesions.  Patient refused surgery, until 1/6 s/p repair of perforated viscus.  Plan for conservative management with TPN, bowel rest.   Significant events:  1/1: TPN per pharmacy ordered - unable to start without central access.  1/3: PICC line placed; TPN started 1/5: Acute encephalopathy with fevers (102F) overnight- sepsis protocol initiated.  Broad-spectrum abx started.  1/6: Consented to laparotomy w/ findings of perforated viscus and peritonitis. Txfer to ICU   1/7:  AKI 1/[name]: NGT removed 1/11: started clear liquid diet 1/13 NPO again for CT guided drain placement for perihepatic fluid collection suspicious for abscess. FL diet post-procedure 1/16: per CCS, no bowel sounds noted today, patient needs to be walked as much as possible 1/18: started full liquid diet, tolerating small amounts. AKI resolved. 1/19: IR: RLQ and RUO drains removed, LUQ drain repositioned 1/22: advanced diet to regular on 1/21 1/23 drains continue to put out yellow purulent drainage 1/24 begin wean of TPN to encourage PO appetite  1/25: 48 hr calorie count ordered.  Plan is to  re-CT once drain output 30-50 ml or less 1/27: mild hypoglycemic episode (CBG 62)-- decreased insulin in TPN bag from 15 units to [name] units 1/29: per Dr. Excell Seltzer, continue TPN for now until oral intake is significantly better  Today 12/24/2015:   Glucose (goal <150): 154-175 (oral intake improving). Hx DM on glipizide PTA.   Electrolytes - Mg down 1.5 despite IV supplementation yesterday.  K, Phos WNL. Cl low.  Na slightly low. CoCa WNL  Renal -  SCr remains WNL  LFTs - all below ULN 1/26  TGs -  remain wnl 1/23  Prealbumin - consistently low, most recently 3.0 (1/23)  Dietary intake remains suboptimal but appetite improving. Eating 75-100% of meals. RD following.  NUTRITIONAL GOALS                                                                                             RD recs: 1600-1800 Kcal/day, 65-80 g protein/day, > 2 L fluid/day  Clinimix E 5/15 at a goal rate of 65 ml/hr + 20% fat emulsion at [name] ml/hr provides: 78 g/day protein, 1588 Kcal/day.  PLAN                                                                                                Continue TPN at 1/2 rate per surgery in hopes of stimulating appetite:  Clinimix E 5/15 at 35 ml/hr   20% Fat Emulsion at 5 mL/hr  Continue regular insulin in TPN to [name] units / 24 hr--> will plan on  increasing insulin content if CBGs trend up  Continue Standard MVI, MTE in TPN bag.  Continue moderate SSI scale with meals and at bedtime.  Magnesium sulfate 2g IV x 1 today.   Routine TPN labs Mondays and Thursdays.  Follow PO intake. Consider discontinuing TPN once patient consistently getting at least 60% of calorie needs from oral intake.   Dia Sitter, PharmD, BCPS 12/24/2015 8:21 AM

## 2015-12-25 ENCOUNTER — Encounter (HOSPITAL_COMMUNITY): Payer: Self-pay | Admitting: Radiology

## 2015-12-25 ENCOUNTER — Inpatient Hospital Stay (HOSPITAL_COMMUNITY): Payer: Medicaid Other

## 2015-12-25 DIAGNOSIS — D62 Acute posthemorrhagic anemia: Secondary | ICD-10-CM

## 2015-12-25 LAB — COMPREHENSIVE METABOLIC PANEL
ALBUMIN: 1.9 g/dL — AB (ref 3.5–5.0)
ALK PHOS: 118 U/L (ref 38–126)
ALT: 12 U/L — AB (ref 17–63)
ANION GAP: 8 (ref 5–15)
AST: 21 U/L (ref 15–41)
BILIRUBIN TOTAL: 0.3 mg/dL (ref 0.3–1.2)
BUN: 20 mg/dL (ref 6–20)
CALCIUM: 8.9 mg/dL (ref 8.9–10.3)
CO2: 32 mmol/L (ref 22–32)
CREATININE: 0.73 mg/dL (ref 0.61–1.24)
Chloride: 95 mmol/L — ABNORMAL LOW (ref 101–111)
GFR calc non Af Amer: >60 mL/min (ref >60)
GLUCOSE: 157 mg/dL — AB (ref 65–99)
Potassium: 5.1 mmol/L (ref 3.5–5.1)
SODIUM: 135 mmol/L (ref 135–145)
TOTAL PROTEIN: 7.1 g/dL (ref 6.5–8.1)

## 2015-12-25 LAB — GLUCOSE, CAPILLARY
GLUCOSE-CAPILLARY: 201 mg/dL — AB (ref 65–99)
Glucose-Capillary: 156 mg/dL — ABNORMAL HIGH (ref 65–99)
Glucose-Capillary: 130 mg/dL — ABNORMAL HIGH (ref 65–99)
Glucose-Capillary: 137 mg/dL — ABNORMAL HIGH (ref 65–99)

## 2015-12-25 LAB — CBC
HEMATOCRIT: 25.4 % — AB (ref 39.0–52.0)
Hemoglobin: 7.8 g/dL — ABNORMAL LOW (ref 13.0–17.0)
MCH: 29.9 pg (ref 26.0–34.0)
MCHC: 30.7 g/dL (ref 30.0–36.0)
MCV: 97.3 fL (ref 78.0–100.0)
PLATELETS: 459 10*3/uL — AB (ref 150–400)
RBC: 2.61 MIL/uL — ABNORMAL LOW (ref 4.22–5.81)
RDW: 16.7 % — AB (ref 11.5–15.5)
WBC: 14.1 10*3/uL — AB (ref 4.0–10.5)

## 2015-12-25 LAB — DIFFERENTIAL
BASOS PCT: 1 %
Basophils Absolute: 0.1 10*3/uL (ref 0.0–0.1)
EOS ABS: 0.1 10*3/uL (ref 0.0–0.7)
EOS PCT: 1 %
Lymphocytes Relative: 9 %
Lymphs Abs: 1.2 10*3/uL (ref 0.7–4.0)
MONO ABS: 1.5 10*3/uL — AB (ref 0.1–1.0)
MONOS PCT: 10 %
Neutro Abs: 11.3 10*3/uL — ABNORMAL HIGH (ref 1.7–7.7)
Neutrophils Relative %: 79 %

## 2015-12-25 LAB — PHOSPHORUS: Phosphorus: 3.3 mg/dL (ref 2.5–4.6)

## 2015-12-25 LAB — TRIGLYCERIDES: TRIGLYCERIDES: 115 mg/dL (ref <150)

## 2015-12-25 LAB — MAGNESIUM: Magnesium: 1.6 mg/dL — ABNORMAL LOW (ref 1.7–2.4)

## 2015-12-25 LAB — PREALBUMIN: Prealbumin: 4.9 mg/dL — ABNORMAL LOW (ref 18–38)

## 2015-12-25 MED ORDER — MAGNESIUM OXIDE 400 (241.3 MG) MG PO TABS
400.0000 mg | ORAL_TABLET | Freq: Two times a day (BID) | ORAL | Status: DC
  Administered 2015-12-26 – 2015-12-30 (×8): 400 mg via ORAL
  Filled 2015-12-25 (×10): qty 1

## 2015-12-25 MED ORDER — IOHEXOL 300 MG/ML  SOLN
100.0000 mL | Freq: Once | INTRAMUSCULAR | Status: AC | PRN
  Administered 2015-12-25: 100 mL via INTRAVENOUS

## 2015-12-25 MED ORDER — TRACE MINERALS CR-CU-MN-SE-ZN 10-1000-500-60 MCG/ML IV SOLN
INTRAVENOUS | Status: AC
  Administered 2015-12-25: 17:00:00 via INTRAVENOUS
  Filled 2015-12-25: qty 840

## 2015-12-25 MED ORDER — HYDROMORPHONE HCL 1 MG/ML IJ SOLN
1.0000 mg | INTRAMUSCULAR | Status: DC | PRN
  Administered 2015-12-25 – 2015-12-29 (×22): 2 mg via INTRAVENOUS
  Administered 2015-12-29 (×2): 1 mg via INTRAVENOUS
  Administered 2015-12-30 (×2): 2 mg via INTRAVENOUS
  Filled 2015-12-25 (×13): qty 2
  Filled 2015-12-25: qty 1
  Filled 2015-12-25 (×10): qty 2
  Filled 2015-12-25: qty 1
  Filled 2015-12-25: qty 2

## 2015-12-25 MED ORDER — DIPHENHYDRAMINE HCL 25 MG PO CAPS
25.0000 mg | ORAL_CAPSULE | Freq: Four times a day (QID) | ORAL | Status: DC | PRN
  Administered 2015-12-26 – 2015-12-27 (×2): 25 mg via ORAL
  Filled 2015-12-25 (×2): qty 1

## 2015-12-25 MED ORDER — MAGNESIUM SULFATE 2 GM/50ML IV SOLN: 2.0000 g | Freq: Once | INTRAVENOUS | Status: DC

## 2015-12-25 MED ORDER — FAT EMULSION 20 % IV EMUL
120.0000 mL | INTRAVENOUS | Status: AC
  Administered 2015-12-25: 120 mL via INTRAVENOUS
  Filled 2015-12-25: qty 250

## 2015-12-25 MED ORDER — MAGNESIUM SULFATE 2 GM/50ML IV SOLN
2.0000 g | Freq: Once | INTRAVENOUS | Status: AC
  Administered 2015-12-25: 2 g via INTRAVENOUS
  Filled 2015-12-25: qty 50

## 2015-12-25 NOTE — Progress Notes (Signed)
PARENTERAL NUTRITION CONSULT NOTE - FOLLOW-UP  Pharmacy Consult for TPN Indication: Bowel obstruction, post op ileus  Allergies  Allergen Reactions  . Zetia [Ezetimibe] Anaphylaxis and Swelling    Tongue and throat   . Atorvastatin Other (See Comments)    Malaise & muscle weakness  . Penicillins Other (See Comments)    Unknown.  Tolerates cefepime  . Pravastatin Sodium     Pravastatin 40 mg qday and 40 mg q M/W/F caused muscle aches  . Lisinopril Rash  . Promethazine Rash   Patient Measurements: Height: 6\' 2"  (188 cm) Weight: 157 lb 10.1 oz (71.5 kg) IBW/kg (Calculated) : 82.2 Usual Weight: 137-140 lbs (62.2-63.6 kg)  Vital Signs: Temp: 99.5 F (37.5 C) (01/30 0525) Temp Source: Oral (01/30 0525) BP: 124/86 mmHg (01/30 0525) Pulse Rate: 95 (01/30 0525) Intake/Output from previous day: 01/29 0701 - 01/30 0700 In: 1539.4 [P.O.:480; TPN:1039.4] Out: 1025 [Urine:925; Drains:100] Intake/Output from this shift:   Labs:  Recent Labs  12/23/15 0320 12/24/15 0516 12/25/15 0450  WBC 11.6* 11.6* 14.1*  HGB 8.0* 8.4* 7.8*  HCT 25.5* 26.9* 25.4*  PLT PLATELET CLUMPS NOTED ON SMEAR, COUNT APPEARS ADEQUATE 236 459*    Recent Labs  12/23/15 0320 12/24/15 0516 12/25/15 0450  NA 133*  --  135  K 4.5  --  5.1  CL 93*  --  95*  CO2 34*  --  32  GLUCOSE 167*  --  157*  BUN 18  --  20  CREATININE 0.71  --  0.73  CALCIUM 8.0*  --  8.9  MG 1.8 1.5* 1.6*  PHOS 3.3  --  3.3  PROT  --   --  7.1  ALBUMIN  --   --  1.9*  AST  --   --  21  ALT  --   --  12*  ALKPHOS  --   --  118  BILITOT  --   --  0.3  Corr Ca 9.8 Estimated Creatinine Clearance: 95.6 mL/min (by C-G formula based on Cr of 0.73).    Recent Labs  12/24/15 1234 12/24/15 1714 12/24/15 2221  GLUCAP 161* 165* 217*     Medical History: History reviewed. No pertinent past medical history. Medications:  Infusions:  . Marland KitchenTPN (CLINIMIX-E) Adult 35 mL/hr at 12/24/15 1720   And  . fat emulsion 120 mL  (12/24/15 1720)   Insulin Requirements in the past 24 hours: ~ 11 units SSI/24h +  10 units Regular Insulin in TPN/24h  Current Nutrition:  Regular diet + Ensure TID (consistently taking over the last 48 hours) Clinimix-E 5/15 at 35 mL/hr Fat Emulsion 20% at 5 mL/hr  IVF: none  Central access: PICC placed 1/3 TPN start date: 1/3  ASSESSMENT                                                                                                          HPI: 83 yoM presented to ED on 12/27 with abdominal pain.  PMH includes HTN, HLD, PVD - s/p fem bypass, CAD s/p NSTEMI  2014, cardiomyopathy, DM, diabetic peripheral neuropathy, seizures, and chronic pain on chronic opioids.  Surgical history significant for open AAA repair.  CT shows partial SBO, likely adhesions.  Patient refused surgery, until 1/6 s/p repair of perforated viscus.  Plan for conservative management with TPN, bowel rest.   Significant events:  1/1: TPN per pharmacy ordered - unable to start without central access.  1/3: PICC line placed; TPN started 1/5: Acute encephalopathy with fevers (102F) overnight- sepsis protocol initiated.  Broad-spectrum abx started.  1/6: Consented to laparotomy w/ findings of perforated viscus and peritonitis. Txfer to ICU   1/7:  AKI 1/10: NGT removed 1/11: started clear liquid diet 1/13 NPO again for CT guided drain placement for perihepatic fluid collection suspicious for abscess. FL diet post-procedure 1/16: per CCS, no bowel sounds noted today, patient needs to be walked as much as possible 1/18: started full liquid diet, tolerating small amounts. AKI resolved. 1/19: IR: RLQ and RUO drains removed, LUQ drain repositioned 1/22: advanced diet to regular on 1/21 1/23 drains continue to put out yellow purulent drainage 1/24 begin wean of TPN to encourage PO appetite  1/25: 48 hr calorie count ordered.  Plan is to re-CT once drain output 30-50 ml or less 1/27: mild hypoglycemic episode (CBG 62)--  decreased insulin in TPN bag from 15 units to 10 units 1/29: per Dr. Excell Seltzer, continue TPN for now until oral intake is significantly better  Today 12/25/2015:   Glucose (goal <150): 161-217 (oral intake improving). Hx DM on glipizide PTA.   Electrolytes - Mg down 1.5 despite IV supplementation for past several days.  K, Phos, Na WNL. Cl low.  CoCa WNL  Renal -  SCr remains WNL  LFTs - all below ULN 1/30  TGs -  remain wnl 1/30  Prealbumin - consistently low, but trending up.  most recently 4.9 (1/30)  Dietary intake, appetite improving. Eating 50-100% of meals, however patient complaining of stomach pains.   RD following.  NUTRITIONAL GOALS                                                                                             RD recs: 1600-1800 Kcal/day, 65-80 g protein/day, > 2 L fluid/day  Clinimix E 5/15 at a goal rate of 65 ml/hr + 20% fat emulsion at 10 ml/hr provides: 78 g/day protein, 1588 Kcal/day.  PLAN                                                                                                Continue TPN at 1/2 rate per surgery in hopes of stimulating appetite:  Clinimix E 5/15 at 35 ml/hr   20% Fat Emulsion at 5 mL/hr  Continue regular insulin in TPN to  10 units / 24 hr--> will plan on increasing insulin content if CBGs trend up  Continue Standard MVI, MTE in TPN bag.  Continue moderate SSI scale with meals and at bedtime.  Magnesium sulfate 2g IV x 1 today.  Start MagOx 400mg  po BID tomorrow.   Routine TPN labs Mondays and Thursdays.  Follow PO intake. Consider discontinuing TPN once patient consistently getting at least 60% of calorie needs from oral intake.  Netta Cedars, PharmD, BCPS Pager: [phone] 12/25/2015 7:27 AM

## 2015-12-25 NOTE — Progress Notes (Signed)
Patient ID: [name] [name], male   DOB: January 12, 1951, 65 y.o.   MRN: [id number] [name] PROGRESS NOTE  YANNICK GENTIS [id number] DOB: July 27, 1951 DOA: 11/21/2015 PCP: Marijean Bravo, MD  Brief narrative:    65 year old very pleasant male with a past medical history of hyperlipidemia, hypertension, CAD, type 2 diabetes, diabetic peripheral neuropathy, seizure disorder, polysubstance abuse and chronic pain issues (on high doses of oxycodone prior to admission) who was admitted 11/22/15 with a small bowel obstruction. Surgical consultation was obtained on admission with conservative therapy initially recommended. Patient's bowel obstruction was felt to be from adhesions and possible narcotic bowel. His NG tube was removed 11/23/15 and he seemed to be improving clinically but pain returned 11/24/15 with diet advancement to clears. Repeat films showed worsening small bowel dilatation so his NG tube was reinserted. TPN was subsequently ordered. On 11/27/15, the patient refused PICC line insertion for TPN and surgical intervention. PICC finally placed 11/28/15 with initiation of TPN.   Further, patient has developed a right upper extremity DVT and currently is on IV heparin.  On 11/30/15, the patient's condition deteriorated acutely and he was found to be confused and restless. Rapid response was called. He was febrile and tachycardic with an elevated lactate level. Due to concerns for sepsis, broad-spectrum antibiotics were initiated with vancomycin and Primaxin which was switched to cefepime given his seizure history. Patient uderwent exploratory laparotomy on 12/01/15 with findings of perforated viscus with peritoneal contamination/peritonitis. He remained intubated postoperatively and was under the care of the critical care team. He self extubated 12/02/15. Surgery continues to follow, patient has an open abdominal wound. He continues to have an ileus and is on TNA at this time. [name]  assumed care 12/05/15.  Transferred to telemetry floor 12/08/2015.  Plan to repeat CT scan early this week to assess if any other drain can be removed.   Assessment/Plan:    Principal problem: Sepsis secondary to SBO w/ perforated viscous and resultant peritonitis / Postoperative ileus / leukocytosis  - S/p abdominal fluid collection drainages 1/13 with removal of RLQ drain 1/20, repositioning of LUQ drain 1/20 and placement of new right subphrenic drain 1/20 - Appreciate surgery following and their recommendations - S/P removal of RLQ drain 1/20, repositioning of LUQ drain 1/20 and placement of new right subphrenic drain 1/20 - Repeat CT scan per surgery maybe tomorrow or Wednesday  Active problems:  Chest pain 1/20 secondary to demand ischemia - Demand ischemia from blood loss - Stable, no chest pain   Hypomagnesemia - Due to nutritional feeds, poor po intake  - Supplement again today   Severe protein calorie malnutrition  - In the context of GI related issues - Continue TNA for nutritional support  - Pt malnourished but not underweight based on BMI - Body mass index is 20.23 kg/(m^2).  Acute upper extremity DVT / Acute blood loss anemia  - US DVT right distal axillary vein, subclavian vein.  - Stopped heparin drip due to drop in Hg 1/23 - Pt transfused two units PRBC 1/23, one unit 1/24 - IVC filters not placed in arm  Post operative respiratory failure with hypoxemia - Patient required ventilatory support after exploratory laparotomy 12/01/2015.  - Patient self extubated 12/02/2015 - Respiratory status has improved with Lasix 80 mg IV given 12/05/2015  Acute Encephalopathy in setting of sepsis / history of seizure disorder - EEG done 11/30/2015 showed no acute seizures - If seizures occur, use Keppra   Acute kidney injury -  In the setting of sepsis and small bowel obstruction with perforated viscus - Creatinine WNL  Controlled, diabetes mellitus type 2 with  peripheral circulatory complications without long-term insulin use - Continue sliding scale insulin   Left lower extremity wound / S/p left fem pop bypass w/ non-healing ulcer - Per WOC assessment   DVT Prophylaxis  - SCD's bilaterally   Code Status: Full.  Family Communication:  Family not at the bedside  Disposition Plan: to SNF once abscesses improved.   IV access:  Peripheral IV  Procedures and diagnostic studies from 12/05/2015:  Ct Abdomen Pelvis Wo Contrast 12/07/2015 1. There is small bilateral pleural effusion right greater than left. Significant atelectasis or infiltrate in right lower lobe posteriorly. There is superimposed mild interstitial prominence bilateral lower lobes probable mild interstitial edema. 2. There is moderate perihepatic and perisplenic ascites. Moderate ascites noted bilateral paracolic gutters. Please note there is some loculation of perihepatic ascites with mild mass effect on the liver contour please see axial images 32 and 15. Peritoneal inflammation or infection cannot be excluded. 3. No hydronephrosis or hydroureter. Stable bilateral renal cysts. 4. Postsurgical changes are noted anterior abdominal wall. Incomplete healed/open subcutaneous midline anterior abdominal wound. 5. Mild distended small bowel loops containing oral contrast material and some air-fluid levels. There is no transition point in caliber of small bowel. Findings most likely due to significant ileus or residual partial small bowel obstruction. There is some oral contrast material noted within distal colon. 6. Moderate pelvic ascites. Moderate gas noted within mid sigmoid colon probable mild ileus. 7. Significant anasarca infiltration of subcutaneous fat abdominal and pelvic wall. 8. 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 Electronically Signed By: Lahoma Crocker M.D. On: 12/07/2015 15:18   Dg Chest Port 1 View 12/06/2015 Stable bilateral pulmonary edema, with right basilar  subsegmental atelectasis and associated pleural effusion. Interval placement of left internal jugular catheter with distal tip in SVC. No pneumothorax is noted. Electronically Signed By: Marijo Conception, M.D. On: 12/06/2015 16:10   Dg Chest Port 1 View 12/05/2015 Worsening of pulmonary interstitial infiltrates bilaterally. This may reflect pulmonary edema of cardiac or noncardiac cause or could reflect pneumonia. There are small bilateral pleural effusions.   Medical Consultants:  Surgery  CCM transfer care to [facility] PCT 12/08/2015 IR  Other Consultants:  Nutrition PT  IAnti-Infectives:   Flagyl 11-30-2015 --> stopped Cefepime 12-02-2015 --> 12/10/2015    Leisa Lenz, MD  [name] Pager [phone]  Time spent in minutes: 15 minutes  If 7PM-7AM, please contact night-coverage [email or web address] Password [email or web address] 12/25/2015, 11:52 AM   LOS: 34 days    HPI/Subjective: No acute overnight events. Pt complains of pain 8/10 in his abdomen.   Objective: Filed Vitals:   12/24/15 0500 12/24/15 1400 12/24/15 2238 12/25/15 0525  BP: 124/84 121/86 121/82 124/86  Pulse: 88 93 100 95  Temp: 98.7 F (37.1 C) 98.6 F (37 C) 98.6 F (37 C) 99.5 F (37.5 C)  TempSrc: Oral Oral Oral Oral  Resp: 16 16 20 16   Height:      Weight:      SpO2: 99% 98% 98% 99%    Intake/Output Summary (Last 24 hours) at 12/25/15 1152 Last data filed at 12/25/15 0900  Gross per 24 hour  Intake 1519.41 ml  Output   1300 ml  Net 219.41 ml    Exam:   General:  Pt is more alert, no distress  Cardiovascular: RRR, appreciate S1, S2   Respiratory: bilateral air  entry, no wheezing   Abdomen: JP drains in place, appreciate bowel sounds  Extremities: No edema, palpable pulses   Neuro: Non focal   Data Reviewed: Basic Metabolic Panel:  Recent Labs Lab 12/20/15 0440 12/21/15 0435 12/22/15 0553 12/23/15 0320 12/24/15 0516 12/25/15 0450  NA 135 133* 132* 133*  --  135  K 4.7 4.5 4.3  4.5  --  5.1  CL 96* 95* 93* 93*  --  95*  CO2 33* 34* 33* 34*  --  32  GLUCOSE 139* 85 72 167*  --  157*  BUN 22* 18 17 18   --  20  CREATININE 0.86 0.73 0.69 0.71  --  0.73  CALCIUM 8.3* 8.0* 8.0* 8.0*  --  8.9  MG 1.5* 1.5* 1.7 1.8 1.5* 1.6*  PHOS 3.3 3.4  --  3.3  --  3.3   Liver Function Tests:  Recent Labs Lab 12/21/15 0435 12/25/15 0450  AST 23 21  ALT 12* 12*  ALKPHOS 112 118  BILITOT 0.7 0.3  PROT 6.2* 7.1  ALBUMIN 1.8* 1.9*   No results for input(s): LIPASE, AMYLASE in the last 168 hours. No results for input(s): AMMONIA in the last 168 hours. CBC:  Recent Labs Lab 12/21/15 0435 12/22/15 0553 12/23/15 0320 12/24/15 0516 12/25/15 0450  WBC 11.5* 12.7* 11.6* 11.6* 14.1*  NEUTROABS  --   --   --   --  11.3*  HGB 8.4* 7.8* 8.0* 8.4* 7.8*  HCT 26.6* 24.7* 25.5* 26.9* 25.4*  MCV 95.3 95.7 97.0 96.8 97.3  PLT PLATELET CLUMPS NOTED ON SMEAR, COUNT APPEARS ADEQUATE 263 PLATELET CLUMPS NOTED ON SMEAR, COUNT APPEARS ADEQUATE 236 459*   Cardiac Enzymes: No results for input(s): CKTOTAL, CKMB, CKMBINDEX, TROPONINI in the last 168 hours. BNP: Invalid input(s): POCBNP CBG:  Recent Labs Lab 12/24/15 1234 12/24/15 1714 12/24/15 2221 12/25/15 0727 12/25/15 1134  GLUCAP 161* 165* 217* 156* 130*    Recent Results (from the past 240 hour(s))  Culture, routine-abscess     Status: None   Collection Time: 12/15/15  1:48 PM  Result Value Ref Range Status   Specimen Description JP DRAINAGE  Final   Special Requests NONE  Final   Gram Stain   Final    ABUNDANT WBC PRESENT,BOTH PMN AND MONONUCLEAR NO SQUAMOUS EPITHELIAL CELLS SEEN NO ORGANISMS SEEN Performed at [facility]    Culture   Final    NO GROWTH 2 DAYS Performed at [facility]    Report Status 12/18/2015 FINAL  Final     Scheduled Meds: . sodium chloride   Intravenous Once  . antiseptic oral rinse  7 mL Mouth Rinse q12n4p  . bacitracin   Topical Daily  . chlorhexidine  15 mL  Mouth Rinse BID  . collagenase  1 application Topical Daily  . feeding supplement (ENSURE ENLIVE)  237 mL Oral TID BM  . fentaNYL (SUBLIMAZE) injection  50 mcg Intravenous Once  . insulin aspart  0-15 Units Subcutaneous TID WC  . insulin aspart  0-5 Units Subcutaneous QHS  . lip balm  1 application Topical BID  . [START ON 12/26/2015] magnesium oxide  400 mg Oral BID  . magnesium sulfate 1 - 4 g bolus IVPB  2 g Intravenous Once  . methocarbamol  1,000 mg Oral TID  . sodium chloride  3 mL Intravenous Q12H  . thiamine  100 mg Oral Daily   Continuous Infusions: . Marland KitchenTPN (CLINIMIX-E) Adult 35 mL/hr at 12/24/15 1720  And  . fat emulsion 120 mL (12/24/15 1720)

## 2015-12-25 NOTE — Progress Notes (Signed)
24 Days Post-Op  Subjective: Says he feels worse today. He is passing flatus and having bm's. Denies abdominal pain  Objective: Vital signs in last 24 hours: Temp:  [98.6 F (37 C)-99.5 F (37.5 C)] 99.5 F (37.5 C) (01/30 0525) Pulse Rate:  [95-100] 95 (01/30 0525) Resp:  [16-20] 16 (01/30 0525) BP: (121-124)/(82-86) 124/86 mmHg (01/30 0525) SpO2:  [98 %-99 %] 99 % (01/30 0525) Last BM Date: 12/24/15  Intake/Output from previous day: 01/29 0701 - 01/30 0700 In: 1539.4 [P.O.:480; TPN:1039.4] Out: 1025 [Urine:925; Drains:100] Intake/Output this shift: Total I/O In: 240 [P.O.:240] Out: 515 [Urine:500; Drains:15]  Resp: clear to auscultation bilaterally Cardio: regular rate and rhythm GI: soft, some distension. wound clean. drains intact  Lab Results:   Recent Labs  12/24/15 0516 12/25/15 0450  WBC 11.6* 14.1*  HGB 8.4* 7.8*  HCT 26.9* 25.4*  PLT 236 459*   BMET  Recent Labs  12/23/15 0320 12/25/15 0450  NA 133* 135  K 4.5 5.1  CL 93* 95*  CO2 34* 32  GLUCOSE 167* 157*  BUN 18 20  CREATININE 0.71 0.73  CALCIUM 8.0* 8.9   PT/INR No results for input(s): LABPROT, INR in the last 72 hours. ABG No results for input(s): PHART, HCO3 in the last 72 hours.  Invalid input(s): PCO2, PO2  Studies/Results: No results found.  Anti-infectives: Anti-infectives    Start     Dose/Rate Route Frequency Ordered Stop   12/15/15 0800  metroNIDAZOLE (FLAGYL) tablet 500 mg  Status:  Discontinued     500 mg Oral Every 6 hours 12/15/15 0714 12/20/15 1055   12/08/15 0800  metroNIDAZOLE (FLAGYL) IVPB 500 mg  Status:  Discontinued     500 mg 100 mL/hr over 60 Minutes Intravenous Every 6 hours 12/08/15 0737 12/15/15 0714   12/03/15 1800  vancomycin (VANCOCIN) IVPB 750 mg/150 ml premix  Status:  Discontinued     750 mg 150 mL/hr over 60 Minutes Intravenous Every 24 hours 12/03/15 1715 12/04/15 0911   12/02/15 1800  ceFEPIme (MAXIPIME) 1 g in dextrose 5 % 50 mL IVPB     1  g 100 mL/hr over 30 Minutes Intravenous Every 12 hours 12/02/15 1014 12/09/15 1759   12/02/15 0600  vancomycin (VANCOCIN) IVPB 1000 mg/200 mL premix  Status:  Discontinued     1,000 mg 200 mL/hr over 60 Minutes Intravenous 3 times per day 12/02/15 0547 12/02/15 0954   12/01/15 2200  ceFEPIme (MAXIPIME) 1 g in dextrose 5 % 50 mL IVPB  Status:  Discontinued     1 g 100 mL/hr over 30 Minutes Intravenous 3 times per day 12/01/15 1455 12/02/15 1014   11/30/15 1400  ceFEPIme (MAXIPIME) 1 g in dextrose 5 % 50 mL IVPB  Status:  Discontinued     1 g 100 mL/hr over 30 Minutes Intravenous 3 times per day 11/30/15 0913 12/01/15 1455   11/30/15 1000  metroNIDAZOLE (FLAGYL) IVPB 500 mg     500 mg 100 mL/hr over 60 Minutes Intravenous Every 8 hours 11/30/15 0858 12/07/15 0226   11/30/15 0645  vancomycin (VANCOCIN) IVPB 750 mg/150 ml premix  Status:  Discontinued     750 mg 150 mL/hr over 60 Minutes Intravenous 3 times per day 11/30/15 0630 12/02/15 0547   11/30/15 0645  imipenem-cilastatin (PRIMAXIN) 500 mg in sodium chloride 0.9 % 100 mL IVPB  Status:  Discontinued     500 mg 200 mL/hr over 30 Minutes Intravenous 3 times per day 11/30/15 0630 11/30/15  0859      Assessment/Plan: s/p Procedure(s): EXPLORATORY LAPAROTOMY WITH RADICAL PERITONEAL DEBRIDEMENT, CLOSURE OF SMALL BOWEL PERFORATION AND LYSIS OF ADHESIONS. (N/A) Continue tpn for nutrition support  Repeat CT today given rising wbc Physical therapy  LOS: 34 days    TOTH III,Darleny Sem S 12/25/2015

## 2015-12-26 LAB — GLUCOSE, CAPILLARY
GLUCOSE-CAPILLARY: 132 mg/dL — AB (ref 65–99)
GLUCOSE-CAPILLARY: 149 mg/dL — AB (ref 65–99)
Glucose-Capillary: 193 mg/dL — ABNORMAL HIGH (ref 65–99)
Glucose-Capillary: 126 mg/dL — ABNORMAL HIGH (ref 65–99)

## 2015-12-26 LAB — CBC
HEMATOCRIT: 25.8 % — AB (ref 39.0–52.0)
HEMOGLOBIN: 8 g/dL — AB (ref 13.0–17.0)
MCH: 30.2 pg (ref 26.0–34.0)
MCHC: 31 g/dL (ref 30.0–36.0)
MCV: 97.4 fL (ref 78.0–100.0)
Platelets: 226 10*3/uL (ref 150–400)
RBC: 2.65 MIL/uL — ABNORMAL LOW (ref 4.22–5.81)
RDW: 16.7 % — ABNORMAL HIGH (ref 11.5–15.5)
WBC: 12.5 10*3/uL — ABNORMAL HIGH (ref 4.0–10.5)

## 2015-12-26 MED ORDER — FAT EMULSION 20 % IV EMUL
120.0000 mL | INTRAVENOUS | Status: AC
  Administered 2015-12-26: 120 mL via INTRAVENOUS
  Filled 2015-12-26: qty 250

## 2015-12-26 MED ORDER — TRACE MINERALS CR-CU-MN-SE-ZN 10-1000-500-60 MCG/ML IV SOLN
INTRAVENOUS | Status: AC
  Administered 2015-12-26: 17:00:00 via INTRAVENOUS
  Filled 2015-12-26: qty 840

## 2015-12-26 NOTE — Progress Notes (Signed)
Nutrition Follow-up  DOCUMENTATION CODES:   Not applicable  INTERVENTION:  - Continue TPN per pharmacy - Continue Ensure Enlive TID - Diet advancement per surgery/MD - Encourage PO intakes of meals and supplements - RD will continue to monitor for needs  NUTRITION DIAGNOSIS:    (Excessive nutrient intake) related to  (TPN, Ensure Enlive, and PO intake) as evidenced by  (128% above protein needs). -resolved x1-2 days with poor appetite now  GOAL:   Patient will meet greater than or equal to 90% of their needs -previously met on average with meal intakes, supplement intakes, and TPN regimen  MONITOR:   PO intake, Weight trends, Labs, Skin, I & O's, Other (Comment) (TPN regimen)  ASSESSMENT:   65 year old male with a past medical history of hyperlipidemia, hypertension, CAD, type 2 diabetes, diabetic peripheral neuropathy, seizure disorder, polysubstance abuse and chronic pain issues (on high doses of oxycodone prior to admission) who was admitted 11/22/15 with a small bowel obstruction. Surgical consultation was obtained on admission with conservative therapy initially recommended. Patient's bowel obstruction was felt to be from adhesions and possible narcotic bowel. His NG tube was removed 11/23/15 and he seemed to be improving clinically but pain returned 11/24/15 with diet advancement to clears. Repeat films showed worsening small bowel dilatation so his NG tube was reinserted. TPN was subsequently ordered. On 11/27/15, the patient refused PICC line insertion for TPN and surgical intervention. PICC finally placed 11/28/15 with initiation of TPN.   Pt continue to receiving Clinimix E 5/15 @ 35 mL/hr with 20% lipids @ 5 mL/hr which is providing 836 kcal, 42 grams of protein. Pt previously on Regular diet and per chart review, he ate 100% of breakfast and lunch on this diet 1/29. Pt downgraded to CLD yesterday PM for repeat CT scan and consumed 25% of breakfast this AM. Lunch tray at bedside  at time of visit and is untouched. Pt reports that since 1/29 appetite has been poor within increasing/ongoing abdominal pain. He states this pain is worse with PO intakes. He also states that he was able to drink an Ensure earlier today.  Likely not meeting needs since downgrade in diet. Medications reviewed. Labs reviewed; CBGs: 130-217 mg/dL, Cl: 95 mmol/L, Mg: 1.6 mg/dL.   Diet Order:  TPN (CLINIMIX-E) Adult Diet clear liquid Room service appropriate?: Yes; Fluid consistency:: Thin TPN (CLINIMIX-E) Adult  Skin:  Wound (see comment) (L foot and toe DM ulcers)  Last BM:  1/29  Height:   Ht Readings from Last 1 Encounters:  12/01/15 _0  (1.88 m)    Weight:   Wt Readings from Last 1 Encounters:  12/08/15 157 lb 10.1 oz (71.5 kg)    Ideal Body Weight:  86.36 kg (kg)  BMI:  Body mass index is 20.23 kg/(m^2).  Estimated Nutritional Needs:   Kcal:  1600-1800  Protein:  65-80 grams  Fluid:  >/= 2 L/day  EDUCATION NEEDS:   No education needs identified at this time     Jarome Matin, RD, LDN Inpatient Clinical Dietitian Pager # [phone] After hours/weekend pager # [phone]

## 2015-12-26 NOTE — Progress Notes (Signed)
25 Days Post-Op  Subjective: Complains of pain. Tolerating some food and passing flatus  Objective: Vital signs in last 24 hours: Temp:  [97.6 F (36.4 C)-98.2 F (36.8 C)] 97.6 F (36.4 C) (01/31 1438) Pulse Rate:  [89-102] 102 (01/31 1438) Resp:  [16] 16 (01/31 1438) BP: (112-145)/(84-92) 112/84 mmHg (01/31 1438) SpO2:  [97 %-100 %] 99 % (01/31 1438) Last BM Date: 12/24/15  Intake/Output from previous day: 01/30 0701 - 01/31 0700 In: 2094.1 [P.O.:440; TPN:1634.1] Out: 1703 [Urine:1600; Drains:103] Intake/Output this shift: Total I/O In: 120 [P.O.:120] Out: 200 [Urine:200]  Resp: clear to auscultation bilaterally Cardio: regular rate and rhythm GI: soft, distended. mild tenderness. wound clean  Lab Results:   Recent Labs  12/25/15 0450 12/26/15 0415  WBC 14.1* 12.5*  HGB 7.8* 8.0*  HCT 25.4* 25.8*  PLT 459* 226   BMET  Recent Labs  12/25/15 0450  NA 135  K 5.1  CL 95*  CO2 32  GLUCOSE 157*  BUN 20  CREATININE 0.73  CALCIUM 8.9   PT/INR No results for input(s): LABPROT, INR in the last 72 hours. ABG No results for input(s): PHART, HCO3 in the last 72 hours.  Invalid input(s): PCO2, PO2  Studies/Results: Ct Abdomen Pelvis W Contrast  12/25/2015  CLINICAL DATA:  Abscess of abdominal cavity. EXAM: CT ABDOMEN AND PELVIS WITH CONTRAST TECHNIQUE: Multidetector CT imaging of the abdomen and pelvis was performed using the standard protocol following bolus administration of intravenous contrast. CONTRAST:  155mL OMNIPAQUE IOHEXOL 300 MG/ML  SOLN COMPARISON:  CT scan of December 14, 2015 and drainage procedure of December 15, 2015. FINDINGS: Severe degenerative disc disease is noted at L5-S1. Bilateral pleural effusions are noted with adjacent subsegmental atelectasis, with left greater than right. No gallstones are noted. Right perihepatic crescent shaped fluid collection is significantly smaller compared to prior exam status post placement of drainage catheter,  which is seen over the dome of right hepatic lobe. It currently measures 9.7 x 1.8 cm. Drainage catheter seen in left perisplenic fluid collection is unchanged in position, with no significant change in the size of this fluid collection pancreas appears normal. Bilateral renal cysts are unchanged. Adrenal glands appear normal. No hydronephrosis or renal obstruction is noted. Drainage catheter noted in right lateral fluid collection on prior study has been removed. 4.6 x 2.8 cm residual fluid collection is noted. Left lateral peritoneal fluid collection is noted which measures 7.6 x 1.5 cm which may be slightly smaller compared to prior exam. There appears to be no evidence of bowel obstruction. Large amount of stool is noted in the sigmoid colon and rectum. Urinary bladder appears normal. Multiple other smaller fluid collections are noted posteriorly in the pelvis, with the largest measuring 3.1 x 1.4 cm. IMPRESSION: Stable bilateral pleural effusions are noted with adjacent subsegmental atelectasis, left greater than right. Right para Padda crescent shaped fluid collection is significantly smaller compared to prior exam, status post placement of new drainage catheter, tip of which is seen over dome of right hepatic lobe. No change seen in position of drainage catheter within left perisplenic fluid collection, which is unchanged in size compared to prior exam. There is been interval removal of drainage catheter from right lateral fluid collection noted on prior exam, with residual fluid collection measuring 4.6 x 2.8 cm which is not significantly changed compared to prior exam. Left lateral peritoneal fluid collection is noted measuring 7.6 x 1.5 cm which may be slightly smaller compared to prior exam. There are noted  multiple smaller and probably loculated fluid collections seen posteriorly in the pelvis, with the largest single collection measuring 3.1 x 1.4 cm. Electronically Signed   By: Marijo Conception, M.D.    On: 12/25/2015 16:33    Anti-infectives: Anti-infectives    Start     Dose/Rate Route Frequency Ordered Stop   12/15/15 0800  metroNIDAZOLE (FLAGYL) tablet 500 mg  Status:  Discontinued     500 mg Oral Every 6 hours 12/15/15 0714 12/20/15 1055   12/08/15 0800  metroNIDAZOLE (FLAGYL) IVPB 500 mg  Status:  Discontinued     500 mg 100 mL/hr over 60 Minutes Intravenous Every 6 hours 12/08/15 0737 12/15/15 0714   12/03/15 1800  vancomycin (VANCOCIN) IVPB 750 mg/150 ml premix  Status:  Discontinued     750 mg 150 mL/hr over 60 Minutes Intravenous Every 24 hours 12/03/15 1715 12/04/15 0911   12/02/15 1800  ceFEPIme (MAXIPIME) 1 g in dextrose 5 % 50 mL IVPB     1 g 100 mL/hr over 30 Minutes Intravenous Every 12 hours 12/02/15 1014 12/09/15 1759   12/02/15 0600  vancomycin (VANCOCIN) IVPB 1000 mg/200 mL premix  Status:  Discontinued     1,000 mg 200 mL/hr over 60 Minutes Intravenous 3 times per day 12/02/15 0547 12/02/15 0954   12/01/15 2200  ceFEPIme (MAXIPIME) 1 g in dextrose 5 % 50 mL IVPB  Status:  Discontinued     1 g 100 mL/hr over 30 Minutes Intravenous 3 times per day 12/01/15 1455 12/02/15 1014   11/30/15 1400  ceFEPIme (MAXIPIME) 1 g in dextrose 5 % 50 mL IVPB  Status:  Discontinued     1 g 100 mL/hr over 30 Minutes Intravenous 3 times per day 11/30/15 0913 12/01/15 1455   11/30/15 1000  metroNIDAZOLE (FLAGYL) IVPB 500 mg     500 mg 100 mL/hr over 60 Minutes Intravenous Every 8 hours 11/30/15 0858 12/07/15 0226   11/30/15 0645  vancomycin (VANCOCIN) IVPB 750 mg/150 ml premix  Status:  Discontinued     750 mg 150 mL/hr over 60 Minutes Intravenous 3 times per day 11/30/15 0630 12/02/15 0547   11/30/15 0645  imipenem-cilastatin (PRIMAXIN) 500 mg in sodium chloride 0.9 % 100 mL IVPB  Status:  Discontinued     500 mg 200 mL/hr over 30 Minutes Intravenous 3 times per day 11/30/15 0630 11/30/15 0859      Assessment/Plan: s/p Procedure(s): EXPLORATORY LAPAROTOMY WITH RADICAL  PERITONEAL DEBRIDEMENT, CLOSURE OF SMALL BOWEL PERFORATION AND LYSIS OF ADHESIONS. (N/A) Advance diet  Continue drains. Will ask IR to aspirate 2 small residual fluid collections PT Continue tpn for malnutrition  LOS: 35 days    TOTH III,Lyndsy Gilberto S 12/26/2015

## 2015-12-26 NOTE — Progress Notes (Signed)
PARENTERAL NUTRITION CONSULT NOTE - FOLLOW-UP  Pharmacy Consult for TPN Indication: Bowel obstruction, post op ileus  Allergies  Allergen Reactions  . Zetia [Ezetimibe] Anaphylaxis and Swelling    Tongue and throat   . Atorvastatin Other (See Comments)    Malaise & muscle weakness  . Penicillins Other (See Comments)    Unknown.  Tolerates cefepime  . Pravastatin Sodium     Pravastatin 40 mg qday and 40 mg q M/W/F caused muscle aches  . Lisinopril Rash  . Promethazine Rash   Patient Measurements: Height: 6\' 2"  (188 cm) Weight: 157 lb 10.1 oz (71.5 kg) IBW/kg (Calculated) : 82.2 Usual Weight: 137-140 lbs (62.2-63.6 kg)  Vital Signs: Temp: 98 F (36.7 C) (01/31 0553) Temp Source: Oral (01/31 0553) BP: 139/92 mmHg (01/31 0553) Pulse Rate: 94 (01/31 0553) Intake/Output from previous day: 01/30 0701 - 01/31 0700 In: 2094.1 [P.O.:440; [id number] Out: 1703 [Urine:1600; Drains:103] Intake/Output from this shift:   Labs:  Recent Labs  12/24/15 0516 12/25/15 0450 12/26/15 0415  WBC 11.6* 14.1* 12.5*  HGB 8.4* 7.8* 8.0*  HCT 26.9* 25.4* 25.8*  PLT 236 459* 226    Recent Labs  12/24/15 0516 12/25/15 0450  NA  --  135  K  --  5.1  CL  --  95*  CO2  --  32  GLUCOSE  --  157*  BUN  --  20  CREATININE  --  0.73  CALCIUM  --  8.9  MG 1.5* 1.6*  PHOS  --  3.3  PROT  --  7.1  ALBUMIN  --  1.9*  AST  --  21  ALT  --  12*  ALKPHOS  --  118  BILITOT  --  0.3  PREALBUMIN  --  4.9*  TRIG  --  115  Corr Ca 9.8 Estimated Creatinine Clearance: 95.6 mL/min (by C-G formula based on Cr of 0.73).    Recent Labs  12/25/15 1134 12/25/15 1628 12/25/15 2249  GLUCAP 130* 137* 201*     Medical History: History reviewed. No pertinent past medical history. Medications:  Infusions:  . Marland KitchenTPN (CLINIMIX-E) Adult 35 mL/hr at 12/25/15 1709   And  . fat emulsion 120 mL (12/25/15 1708)   Insulin Requirements in the past 24 hours: ~ 7 units SSI/24h +  10 units Regular  Insulin in TPN/24h  Current Nutrition:  Regular diet + Ensure TID (eating 25-100% over the last 48 hours) Clinimix-E 5/15 at 35 mL/hr Fat Emulsion 20% at 5 mL/hr  IVF: none  Central access: PICC placed 1/3 TPN start date: 1/3  ASSESSMENT                                                                                                          HPI: 62 yoM presented to ED on 12/27 with abdominal pain.  PMH includes HTN, HLD, PVD - s/p fem bypass, CAD s/p NSTEMI 2014, cardiomyopathy, DM, diabetic peripheral neuropathy, seizures, and chronic pain on chronic opioids.  Surgical history significant for open AAA repair.  CT shows partial  SBO, likely adhesions.  Patient refused surgery, until 1/6 s/p repair of perforated viscus.  Plan for conservative management with TPN, bowel rest.   Significant events:  1/1: TPN per pharmacy ordered - unable to start without central access.  1/3: PICC line placed; TPN started 1/5: Acute encephalopathy with fevers (102F) overnight- sepsis protocol initiated.  Broad-spectrum abx started.  1/6: Consented to laparotomy w/ findings of perforated viscus and peritonitis. Txfer to ICU   1/7:  AKI 1/10: NGT removed 1/11: started clear liquid diet 1/13 NPO again for CT guided drain placement for perihepatic fluid collection suspicious for abscess. FL diet post-procedure 1/16: per CCS, no bowel sounds noted today, patient needs to be walked as much as possible 1/18: started full liquid diet, tolerating small amounts. AKI resolved. 1/19: IR: RLQ and RUO drains removed, LUQ drain repositioned 1/22: advanced diet to regular on 1/21 1/23 drains continue to put out yellow purulent drainage 1/24 begin wean of TPN to encourage PO appetite  1/25: 48 hr calorie count ordered.  Plan is to re-CT once drain output 30-50 ml or less 1/27: mild hypoglycemic episode (CBG 62)-- decreased insulin in TPN bag from 15 units to 10 units 1/29: per Dr. Excell Seltzer, continue TPN for now until oral  intake is significantly better  Today 12/26/2015:   Glucose (goal <150): 130-201 (oral intake improving). Hx DM on glipizide PTA.   Electrolytes - (last checked 1/30) Mg down 1.5 despite IV supplementation for past several days.  K, Phos, Na WNL. Cl low.  CoCa WNL  Renal -  SCr remains WNL  LFTs - all below ULN 1/30  TGs -  remain wnl 1/30  Prealbumin - consistently low, but trending up.  most recently 4.9 (1/30) Dietary intake, appetite was improving, but now complaining of feeling worse. Eating 25-100% of meals.     NUTRITIONAL GOALS                                                                                             RD recs: 1600-1800 Kcal/day, 65-80 g protein/day, > 2 L fluid/day  Clinimix E 5/15 at a goal rate of 65 ml/hr + 20% fat emulsion at 10 ml/hr provides: 78 g/day protein, 1588 Kcal/day.  PLAN                                                                                                Continue TPN at 1/2 rate per surgery in hopes of stimulating appetite:  Clinimix E 5/15 at 35 ml/hr   20% Fat Emulsion at 5 mL/hr  Continue regular insulin in TPN to 10 units / 24 hr  Continue Standard MVI, MTE in TPN bag.  Continue moderate SSI scale with meals and at bedtime.  MagOx 400mg  po BID.   Routine TPN labs Mondays and Thursdays.  Follow PO intake. Consider discontinuing TPN once patient consistently getting at least 60% of calorie needs from oral intake.  Netta Cedars, PharmD, BCPS Pager: [phone] 12/26/2015 7:09 AM

## 2015-12-26 NOTE — Progress Notes (Addendum)
Patient ID: [name] [name], male   DOB: Oct 07, 1951, 65 y.o.   MRN: [id number]    Referring Physician(s): CCS  Chief Complaint: Intra-abdominal abscesses  Subjective: Pt c/o pain No other new complaints   Allergies: Zetia; Atorvastatin; Penicillins; Pravastatin sodium; Lisinopril; and Promethazine  Medications: Prior to Admission medications   Medication Sig Start Date End Date Taking? Authorizing Provider  albuterol (PROVENTIL HFA;VENTOLIN HFA) 108 (90 BASE) MCG/ACT inhaler Inhale 2 puffs into the lungs every 6 (six) hours as needed for wheezing or shortness of breath. 11/11/13  Yes Ripudeep Krystal Eaton, MD  aspirin EC 81 MG tablet Take 1 tablet (81 mg total) by mouth daily. 11/06/15  Yes Sherren Mocha, MD  collagenase (SANTYL) ointment Apply thin layer to wound bed of left foot daily Patient taking differently: Apply 1 application topically daily. Apply thin layer to wound bed of left foot daily 10/26/15  Yes Rosetta Posner, MD  gabapentin (NEURONTIN) 300 MG capsule Take 300 mg by mouth 3 (three) times daily. 09/30/15  Yes Historical Provider, MD  glipiZIDE (GLUCOTROL) 10 MG tablet Take 10 mg by mouth 2 (two) times daily before a meal.   Yes Historical Provider, MD  ketoconazole (NIZORAL) 2 % cream Apply 1 application topically daily as needed for irritation.   Yes Historical Provider, MD  Multiple Vitamins-Minerals (MULTIVITAMIN WITH MINERALS) tablet Take 4 tablets by mouth 3 (three) times daily.   Yes Historical Provider, MD  nitroGLYCERIN (NITROSTAT) 0.4 MG SL tablet Place 0.4 mg under the tongue every 5 (five) minutes as needed for chest pain.   Yes Historical Provider, MD  oxyCODONE (ROXICODONE) 15 MG immediate release tablet Take 3 tablets (45 mg total) by mouth every 6 (six) hours as needed (severe pain). 07/30/15  Yes Kimberly A Trinh, PA-C  PATADAY 0.2 % SOLN Place 1 drop into both eyes daily.  08/11/14  Yes Historical Provider, MD  polyethylene glycol (MIRALAX / GLYCOLAX) packet Take 17  g by mouth daily as needed for mild constipation or moderate constipation.    Yes Historical Provider, MD  triamcinolone cream (KENALOG) 0.1 % Apply 1 application topically 2 (two) times daily as needed (for eczema).   Yes Historical Provider, MD  zolpidem (AMBIEN) 10 MG tablet Take 10 mg by mouth at bedtime as needed for sleep.   Yes Historical Provider, MD  silver sulfADIAZINE (SILVADENE) 1 % cream Apply 1 application topically daily. Patient not taking: Reported on 11/14/2015 07/25/15   Rosetta Posner, MD    Vital Signs: BP 139/92 mmHg  Pulse 94  Temp(Src) 98 F (36.7 C) (Oral)  Resp 16  Ht 6\' 2"  (1.88 m)  Wt 157 lb 10.1 oz (71.5 kg)  BMI 20.23 kg/m2  SpO2 100%  Physical Exam: Abd: soft, midline wound is intact with dressing in place, right drain to gravity, site is c/d/i, minimal serous drainage (68cc/24hr)  Left sided bulb drain with small amount of seropurulent fluid (35cc/24hr).  Site is c/d/i  Imaging: Ct Abdomen Pelvis W Contrast  12/25/2015  CLINICAL DATA:  Abscess of abdominal cavity. EXAM: CT ABDOMEN AND PELVIS WITH CONTRAST TECHNIQUE: Multidetector CT imaging of the abdomen and pelvis was performed using the standard protocol following bolus administration of intravenous contrast. CONTRAST:  156mL OMNIPAQUE IOHEXOL 300 MG/ML  SOLN COMPARISON:  CT scan of December 14, 2015 and drainage procedure of December 15, 2015. FINDINGS: Severe degenerative disc disease is noted at L5-S1. Bilateral pleural effusions are noted with adjacent subsegmental atelectasis, with left greater than right.  No gallstones are noted. Right perihepatic crescent shaped fluid collection is significantly smaller compared to prior exam status post placement of drainage catheter, which is seen over the dome of right hepatic lobe. It currently measures 9.7 x 1.8 cm. Drainage catheter seen in left perisplenic fluid collection is unchanged in position, with no significant change in the size of this fluid collection  pancreas appears normal. Bilateral renal cysts are unchanged. Adrenal glands appear normal. No hydronephrosis or renal obstruction is noted. Drainage catheter noted in right lateral fluid collection on prior study has been removed. 4.6 x 2.8 cm residual fluid collection is noted. Left lateral peritoneal fluid collection is noted which measures 7.6 x 1.5 cm which may be slightly smaller compared to prior exam. There appears to be no evidence of bowel obstruction. Large amount of stool is noted in the sigmoid colon and rectum. Urinary bladder appears normal. Multiple other smaller fluid collections are noted posteriorly in the pelvis, with the largest measuring 3.1 x 1.4 cm. IMPRESSION: Stable bilateral pleural effusions are noted with adjacent subsegmental atelectasis, left greater than right. Right para Padda crescent shaped fluid collection is significantly smaller compared to prior exam, status post placement of new drainage catheter, tip of which is seen over dome of right hepatic lobe. No change seen in position of drainage catheter within left perisplenic fluid collection, which is unchanged in size compared to prior exam. There is been interval removal of drainage catheter from right lateral fluid collection noted on prior exam, with residual fluid collection measuring 4.6 x 2.8 cm which is not significantly changed compared to prior exam. Left lateral peritoneal fluid collection is noted measuring 7.6 x 1.5 cm which may be slightly smaller compared to prior exam. There are noted multiple smaller and probably loculated fluid collections seen posteriorly in the pelvis, with the largest single collection measuring 3.1 x 1.4 cm. Electronically Signed   By: Marijo Conception, M.D.   On: 12/25/2015 16:33    Labs:  CBC:  Recent Labs  12/23/15 0320 12/24/15 0516 12/25/15 0450 12/26/15 0415  WBC 11.6* 11.6* 14.1* 12.5*  HGB 8.0* 8.4* 7.8* 8.0*  HCT 25.5* 26.9* 25.4* 25.8*  PLT PLATELET CLUMPS NOTED ON  SMEAR, COUNT APPEARS ADEQUATE 236 459* 226    COAGS:  Recent Labs  07/26/15 1415 12/06/15 0925 12/08/15 0340  INR 1.09 1.89* 1.82*  APTT 37 45*  --     BMP:  Recent Labs  12/21/15 0435 12/22/15 0553 12/23/15 0320 12/25/15 0450  NA 133* 132* 133* 135  K 4.5 4.3 4.5 5.1  CL 95* 93* 93* 95*  CO2 34* 33* 34* 32  GLUCOSE 85 72 167* 157*  BUN 18 17 18 20   CALCIUM 8.0* 8.0* 8.0* 8.9  CREATININE 0.73 0.69 0.71 0.73  GFRNONAA >60 >60 >60 >60  GFRAA >60 >60 >60 >60    LIVER FUNCTION TESTS:  Recent Labs  12/14/15 0515 12/18/15 0630 12/21/15 0435 12/25/15 0450  BILITOT 0.5 0.4 0.7 0.3  AST 21 21 23 21   ALT 9* 11* 12* 12*  ALKPHOS 101 111 112 118  PROT 6.2* 6.7 6.2* 7.1  ALBUMIN 1.8* 1.8* 1.8* 1.9*    Assessment and Plan: S/p abdominal fluid collection drainages 1/13 with removal of RLQ drain 1/20, repositioning of LUQ drain 1/20 and placement of new right subphrenic drain 1/20 -will have Dr. Vernard Gambles review new imaging to determine if any further intervention is needed.  For now cont both of the drains he has and  irrigation.  Electronically Signed: Henreitta Cea 12/26/2015, 9:51 AM   I spent a total of 15 Minutes at the the patient's bedside AND on the patient's hospital floor or unit, greater than 50% of which was counseling/coordinating care for intra-abdominal abscesses   ADDENDUM: Will proceed with CT guided aspiration of both abdominal fluid collections tomorrow as patient has already ate and drank today.  NPO p MN.  D/w Dr. Vernard Gambles.  Dr. Barbie Banner will be here tomorrow. -Risks and Benefits discussed with the patient including bleeding, infection, damage to adjacent structures, bowel perforation/fistula connection, and sepsis. All of the patient's questions were answered, patient is agreeable to proceed. Consent signed and in chart.  Orie Cuttino E 12:54 PM 12/26/2015

## 2015-12-26 NOTE — Progress Notes (Signed)
Patient ID: [name] [name], male   DOB: 1950-12-29, 65 y.o.   MRN: [id number] [name] PROGRESS NOTE  [name] [name] [id number] DOB: December 11, 1960 DOA: 11/21/2015 PCP: Marijean Bravo, MD  Brief narrative:    65 year old very pleasant male with a past medical history of hyperlipidemia, hypertension, CAD, type 2 diabetes, diabetic peripheral neuropathy, seizure disorder, polysubstance abuse and chronic pain issues (on high doses of oxycodone prior to admission) who was admitted 11/22/15 with a small bowel obstruction. Surgical consultation was obtained on admission with conservative therapy initially recommended. Patient's bowel obstruction was felt to be from adhesions and possible narcotic bowel. His NG tube was removed 11/23/15 and he seemed to be improving clinically but pain returned 11/24/15 with diet advancement to clears. Repeat films showed worsening small bowel dilatation so his NG tube was reinserted. TPN was subsequently ordered. On 11/27/15, the patient refused PICC line insertion for TPN and surgical intervention. PICC finally placed 11/28/15 with initiation of TPN.   Further, patient has developed a right upper extremity DVT and he was on heparin but AC stopped due to bleed.   On 11/30/15, the patient's condition deteriorated acutely and he was found to be confused and restless. Rapid response was called. He was febrile and tachycardic with an elevated lactate level. Due to concerns for sepsis, broad-spectrum antibiotics were initiated with vancomycin and Primaxin which was switched to cefepime given his seizure history. Patient uderwent exploratory laparotomy on 12/01/15 with findings of perforated viscus with peritoneal contamination/peritonitis. He remained intubated postoperatively and was under the care of the critical care team. He self extubated 12/02/15. Surgery continues to follow, patient has an open abdominal wound. He continues to have an ileus and is on TNA at this time.  [name] assumed care 12/05/15.  Transferred to telemetry floor 12/08/2015.  Plan to repeat CT scan today to assess if any drain can be removed.   Assessment/Plan:    Principal problem: Sepsis secondary to SBO w/ perforated viscous and resultant peritonitis / Postoperative ileus / leukocytosis  - S/p abdominal fluid collection drainages 1/13 with removal of RLQ drain 1/20, repositioning of LUQ drain 1/20 and placement of new right subphrenic drain 1/20 - Appreciate surgery following  - S/P removal of RLQ drain 1/20, repositioning of LUQ drain 1/20 and placement of new right subphrenic drain 1/20 - CT scan scheduled for today to assess if any other 2 drains can be removed.  Active problems:  Chest pain 1/20 secondary to demand ischemia - Demand ischemia from blood loss - No further reports of chest pain  Hypomagnesemia - Due to nutritional feeds, poor po intake  - Supplemented - Check magnesium level tomorrow morning  Severe protein calorie malnutrition  - In the context of GI related issues - Continue TNA for nutritional support  - Pt malnourished but not underweight based on BMI - Body mass index is 20.23 kg/(m^2).  Acute upper extremity DVT / Acute blood loss anemia  - US DVT right distal axillary vein, subclavian vein.  - Stopped heparin drip due to drop in Hg 1/23. His hemoglobin continues to be borderline low, 8.0 this morning. - Pt transfused two units PRBC 1/23, one unit 1/24 - IVC filters not placed in arm  Post operative respiratory failure with hypoxemia - Patient required ventilatory support after exploratory laparotomy 12/01/2015.  - Patient self extubated 12/02/2015 - Respiratory status has improved with Lasix 80 mg IV given 12/05/2015  Acute Encephalopathy in setting of sepsis / history of seizure  disorder - EEG done 11/30/2015 showed no acute seizures - If seizures occur, use Keppra   Acute kidney injury - In the setting of sepsis and small  bowel obstruction with perforated viscus - Creatinine normalized with fluid  Controlled, diabetes mellitus type 2 with peripheral circulatory complications without long-term insulin use - Continue sliding scale insulin   Left lower extremity wound / S/p left fem pop bypass w/ non-healing ulcer - Per WOC assessment   DVT Prophylaxis  - SCD's bilaterally due to risk of bleeding  Code Status: Full.  Family Communication:  Family not at the bedside  Disposition Plan: to SNF once abscesses improved.   IV access:  Peripheral IV  Procedures and diagnostic studies from 12/05/2015:  Ct Abdomen Pelvis Wo Contrast 12/07/2015 1. There is small bilateral pleural effusion right greater than left. Significant atelectasis or infiltrate in right lower lobe posteriorly. There is superimposed mild interstitial prominence bilateral lower lobes probable mild interstitial edema. 2. There is moderate perihepatic and perisplenic ascites. Moderate ascites noted bilateral paracolic gutters. Please note there is some loculation of perihepatic ascites with mild mass effect on the liver contour please see axial images 32 and 15. Peritoneal inflammation or infection cannot be excluded. 3. No hydronephrosis or hydroureter. Stable bilateral renal cysts. 4. Postsurgical changes are noted anterior abdominal wall. Incomplete healed/open subcutaneous midline anterior abdominal wound. 5. Mild distended small bowel loops containing oral contrast material and some air-fluid levels. There is no transition point in caliber of small bowel. Findings most likely due to significant ileus or residual partial small bowel obstruction. There is some oral contrast material noted within distal colon. 6. Moderate pelvic ascites. Moderate gas noted within mid sigmoid colon probable mild ileus. 7. Significant anasarca infiltration of subcutaneous fat abdominal and pelvic wall. 8. 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  Electronically Signed By: Lahoma Crocker M.D. On: 12/07/2015 15:18   Dg Chest Port 1 View 12/06/2015 Stable bilateral pulmonary edema, with right basilar subsegmental atelectasis and associated pleural effusion. Interval placement of left internal jugular catheter with distal tip in SVC. No pneumothorax is noted. Electronically Signed By: Marijo Conception, M.D. On: 12/06/2015 16:10   Dg Chest Port 1 View 12/05/2015 Worsening of pulmonary interstitial infiltrates bilaterally. This may reflect pulmonary edema of cardiac or noncardiac cause or could reflect pneumonia. There are small bilateral pleural effusions.   Medical Consultants:  Surgery  CCM transfer care to [facility] 1-10 PCT 12/08/2015 IR  Other Consultants:  Nutrition PT  IAnti-Infectives:   Flagyl 11-30-2015 --> stopped Cefepime 12-02-2015 --> 12/10/2015    Leisa Lenz, MD  [name] Pager [phone]  Time spent in minutes: 15 minutes  If 7PM-7AM, please contact night-coverage [email or web address] Password [email or web address] 12/26/2015, 12:14 PM   LOS: 35 days    HPI/Subjective: No acute overnight events. No nausea or vomiting.  Objective: Filed Vitals:   12/25/15 0525 12/25/15 1400 12/26/15 0000 12/26/15 0553  BP: 124/86 119/83 145/90 139/92  Pulse: 95 85 89 94  Temp: 99.5 F (37.5 C) 98 F (36.7 C) 98.2 F (36.8 C) 98 F (36.7 C)  TempSrc: Oral Oral Oral Oral  Resp: 16 15 16 16   Height:      Weight:      SpO2: 99% 92% 97% 100%    Intake/Output Summary (Last 24 hours) at 12/26/15 1214 Last data filed at 12/26/15 1003  Gross per 24 hour  Intake 1954.08 ml  Output   1403 ml  Net 551.08 ml  Exam:   General:  Pt is sleeping, in no distress  Cardiovascular: Rate controlled, appreciate S1-S2  Respiratory: No wheezing, no rhonchi  Abdomen: JP drains in place, nontender, appreciate pulses  Extremities: Palpable pulses, no swelling  Neuro: No focal deficits  Data Reviewed: Basic Metabolic  Panel:  Recent Labs Lab 12/20/15 0440 12/21/15 0435 12/22/15 0553 12/23/15 0320 12/24/15 0516 12/25/15 0450  NA 135 133* 132* 133*  --  135  K 4.7 4.5 4.3 4.5  --  5.1  CL 96* 95* 93* 93*  --  95*  CO2 33* 34* 33* 34*  --  32  GLUCOSE 139* 85 72 167*  --  157*  BUN 22* 18 17 18   --  20  CREATININE 0.86 0.73 0.69 0.71  --  0.73  CALCIUM 8.3* 8.0* 8.0* 8.0*  --  8.9  MG 1.5* 1.5* 1.7 1.8 1.5* 1.6*  PHOS 3.3 3.4  --  3.3  --  3.3   Liver Function Tests:  Recent Labs Lab 12/21/15 0435 12/25/15 0450  AST 23 21  ALT 12* 12*  ALKPHOS 112 118  BILITOT 0.7 0.3  PROT 6.2* 7.1  ALBUMIN 1.8* 1.9*   No results for input(s): LIPASE, AMYLASE in the last 168 hours. No results for input(s): AMMONIA in the last 168 hours. CBC:  Recent Labs Lab 12/22/15 0553 12/23/15 0320 12/24/15 0516 12/25/15 0450 12/26/15 0415  WBC 12.7* 11.6* 11.6* 14.1* 12.5*  NEUTROABS  --   --   --  11.3*  --   HGB 7.8* 8.0* 8.4* 7.8* 8.0*  HCT 24.7* 25.5* 26.9* 25.4* 25.8*  MCV 95.7 97.0 96.8 97.3 97.4  PLT 263 PLATELET CLUMPS NOTED ON SMEAR, COUNT APPEARS ADEQUATE 236 459* 226   Cardiac Enzymes: No results for input(s): CKTOTAL, CKMB, CKMBINDEX, TROPONINI in the last 168 hours. BNP: Invalid input(s): POCBNP CBG:  Recent Labs Lab 12/25/15 0727 12/25/15 1134 12/25/15 1628 12/25/15 2249 12/26/15 0735  GLUCAP 156* 130* 137* 201* 132*    No results found for this or any previous visit (from the past 240 hour(s)).   Scheduled Meds: . antiseptic oral rinse  7 mL Mouth Rinse q12n4p  . bacitracin   Topical Daily  . chlorhexidine  15 mL Mouth Rinse BID  . collagenase  1 application Topical Daily  . feeding supplement (ENSURE ENLIVE)  237 mL Oral TID BM  . fentaNYL (SUBLIMAZE) injection  50 mcg Intravenous Once  . insulin aspart  0-15 Units Subcutaneous TID WC  . insulin aspart  0-5 Units Subcutaneous QHS  . lip balm  1 application Topical BID  . magnesium oxide  400 mg Oral BID  .  methocarbamol  1,000 mg Oral TID  . sodium chloride  3 mL Intravenous Q12H  . thiamine  100 mg Oral Daily   Continuous Infusions: . Marland KitchenTPN (CLINIMIX-E) Adult 35 mL/hr at 12/25/15 1709   And  . fat emulsion 120 mL (12/25/15 1708)  . Marland KitchenTPN (CLINIMIX-E) Adult     And  . fat emulsion

## 2015-12-26 NOTE — Progress Notes (Signed)
Physical Therapy Treatment Patient Details Name: [name] [name] MRN: [id number] DOB: 07/17/1951 Today's Date: 12/26/2015    History of Present Illness 65 year old aam who was admitted on 12/27 w/ abd pain found to be 2/2 SBO on CT scan. Marland Kitchen His hospital course was complicated by fever spike and delirium on 1/5. At that time has abd was more distended.  He went for exploratory lap 12/01/15 which was notable for significant peritoneal contamination. He is s/p exploration, washout and repair of perforated viscous    PT Comments    Patient able to participate this session and eager for improved strength, though fearful of both pain and fall risk due to profound weakness.  Safer OOB with lift equipment and able to participate with improved safety.  Will need extensive rehab prior to d/c home so continue to recommend SNF level rehab.  Follow Up Recommendations  SNF;Supervision/Assistance - 24 hour     Equipment Recommendations  Wheelchair (measurements PT)    Recommendations for Other Services       Precautions / Restrictions Precautions Precautions: Fall Precaution Comments: profound LE weakness, abd  drains and from back on R    Mobility  Bed Mobility Overal bed mobility: Needs Assistance Bed Mobility: Rolling Rolling: Mod assist Sidelying to sit: Max assist;+2 for physical assistance       General bed mobility comments: assist to get legs off bed and to lift trunk upright with cues and increased time  Transfers Overall transfer level: Needs assistance   Transfers: Sit to/from Stand Sit to Stand: Mod assist;+2 physical assistance         General transfer comment: assist up from elevated bed and pt instructed to pull up on Stedy.  Able to stand long enough to allow placement of seat and cover over seat; mod cues and encouragement as pt fearful in standing; pivoted to chair on Stedy; pt sitting on Stedy to urinate into urinal at his request, then stood to move seat and allow to  sit on chair with pillows in seat  Ambulation/Gait                 Stairs            Wheelchair Mobility    Modified Rankin (Stroke Patients Only)       Balance Overall balance assessment: Needs assistance Sitting-balance support: No upper extremity supported Sitting balance-Leahy Scale: Fair Sitting balance - Comments: limited trunk strength due to pain in abdomen   Standing balance support: Bilateral upper extremity supported Standing balance-Leahy Scale: Poor Standing balance comment: holds on tightly to bar on stedy with blocking of knees by the lift device                    Cognition Arousal/Alertness: Awake/alert Behavior During Therapy: WFL for tasks assessed/performed Overall Cognitive Status: Impaired/Different from baseline Area of Impairment: Following commands;Problem solving       Following Commands: Follows one step commands with increased time     Problem Solving: Slow processing;Requires verbal cues      Exercises General Exercises - Lower Extremity Heel Slides: AAROM;Strengthening;Both;10 reps;Supine (assist for flexion of hips, resistance to extension) Hip ABduction/ADduction: AROM;Supine;10 reps (isometric with pillow between knees 5 sec hold)    General Comments        Pertinent Vitals/Pain Faces Pain Scale: Hurts whole lot Pain Location: abdomen with movement Pain Descriptors / Indicators: Stabbing;Sharp Pain Intervention(s): Repositioned;Monitored during session;Limited activity within patient's tolerance    Home  Living                      Prior Function            PT Goals (current goals can now be found in the care plan section) Progress towards PT goals: Progressing toward goals    Frequency  Min 2X/week    PT Plan Current plan remains appropriate    Co-evaluation             End of Session   Activity Tolerance: Patient limited by fatigue Patient left: in chair;with call bell/phone  within reach;with chair alarm set     Time: [id number] PT Time Calculation (min) (ACUTE ONLY): 38 min  Charges:  $Therapeutic Exercise: 8-22 mins $Therapeutic Activity: 23-37 mins                    G Codes:      Reginia Naas 01/05/16, 2:52 PM  Magda Kiel, [employer] 01-05-2016

## 2015-12-27 ENCOUNTER — Inpatient Hospital Stay (HOSPITAL_COMMUNITY): Payer: Medicaid Other

## 2015-12-27 ENCOUNTER — Encounter (HOSPITAL_COMMUNITY): Payer: Self-pay | Admitting: Radiology

## 2015-12-27 LAB — PROTIME-INR
INR: 1.28 (ref 0.00–1.49)
Prothrombin Time: 16.1 seconds — ABNORMAL HIGH (ref 11.6–15.2)

## 2015-12-27 LAB — GLUCOSE, CAPILLARY
GLUCOSE-CAPILLARY: 64 mg/dL — AB (ref 65–99)
Glucose-Capillary: 106 mg/dL — ABNORMAL HIGH (ref 65–99)
Glucose-Capillary: 111 mg/dL — ABNORMAL HIGH (ref 65–99)
Glucose-Capillary: 129 mg/dL — ABNORMAL HIGH (ref 65–99)

## 2015-12-27 LAB — MAGNESIUM: MAGNESIUM: 1.7 mg/dL (ref 1.7–2.4)

## 2015-12-27 LAB — POTASSIUM: POTASSIUM: 4.8 mmol/L (ref 3.5–5.1)

## 2015-12-27 MED ORDER — MIDAZOLAM HCL 2 MG/2ML IJ SOLN
INTRAMUSCULAR | Status: AC | PRN
  Administered 2015-12-27: 0.5 mg via INTRAVENOUS

## 2015-12-27 MED ORDER — FENTANYL CITRATE (PF) 100 MCG/2ML IJ SOLN
INTRAMUSCULAR | Status: AC | PRN
  Administered 2015-12-27: 25 ug via INTRAVENOUS

## 2015-12-27 MED ORDER — FENTANYL CITRATE (PF) 100 MCG/2ML IJ SOLN
INTRAMUSCULAR | Status: AC
  Filled 2015-12-27: qty 4

## 2015-12-27 MED ORDER — TRACE MINERALS CR-CU-MN-SE-ZN 10-1000-500-60 MCG/ML IV SOLN
INTRAVENOUS | Status: AC
  Administered 2015-12-27: 17:00:00 via INTRAVENOUS
  Filled 2015-12-27: qty 840

## 2015-12-27 MED ORDER — FAT EMULSION 20 % IV EMUL
120.0000 mL | INTRAVENOUS | Status: AC
  Administered 2015-12-27: 120 mL via INTRAVENOUS
  Filled 2015-12-27: qty 200

## 2015-12-27 MED ORDER — MIDAZOLAM HCL 2 MG/2ML IJ SOLN
INTRAMUSCULAR | Status: AC
  Filled 2015-12-27: qty 6

## 2015-12-27 MED ORDER — VANCOMYCIN HCL IN DEXTROSE 1-5 GM/200ML-% IV SOLN
1000.0000 mg | Freq: Once | INTRAVENOUS | Status: AC
  Administered 2015-12-27: 1000 mg via INTRAVENOUS
  Filled 2015-12-27: qty 200

## 2015-12-27 NOTE — Procedures (Signed)
R and L abdom abscess aspiration No comp/EBL

## 2015-12-27 NOTE — Progress Notes (Signed)
Patient ID: [name] [name], male   DOB: [birth year], 65 y.o.   MRN: [id number] [name] PROGRESS NOTE  [name] [name] [id number] DOB: 20-Jan-1951 DOA: 11/21/2015 PCP: Marijean Bravo, MD  Brief narrative:    65 year old very pleasant male with a past medical history of hyperlipidemia, hypertension, CAD, type 2 diabetes, diabetic peripheral neuropathy, seizure disorder, polysubstance abuse and chronic pain issues (on high doses of oxycodone prior to admission) who was admitted 11/22/15 with a small bowel obstruction. Surgical consultation was obtained on admission with conservative therapy initially recommended. Patient's bowel obstruction was felt to be from adhesions and possible narcotic bowel. His NG tube was removed 11/23/15 and he seemed to be improving clinically but pain returned 11/24/15 with diet advancement to clears. Repeat films showed worsening small bowel dilatation so his NG tube was reinserted. TPN was subsequently ordered. On 11/27/15, the patient refused PICC line insertion for TPN and surgical intervention. PICC finally placed 11/28/15 with initiation of TPN.   Further, patient has developed a right upper extremity DVT and he was on heparin but AC stopped due to bleed.   On 11/30/15, the patient's condition deteriorated acutely and he was found to be confused and restless. Rapid response was called. He was febrile and tachycardic with an elevated lactate level. Due to concerns for sepsis, broad-spectrum antibiotics were initiated with vancomycin and Primaxin which was switched to cefepime given his seizure history. Patient uderwent exploratory laparotomy on 12/01/15 with findings of perforated viscus with peritoneal contamination/peritonitis. He remained intubated postoperatively and was under the care of the critical care team. He self extubated 12/02/15. Surgery continues to follow, patient has an open abdominal wound. He continues to have an ileus and is on TNA at this time.  [name] assumed care 12/05/15.  Transferred to telemetry floor 12/08/2015.  Will follow up with surgery and IR if drains can be removed.   Assessment/Plan:    Principal problem: Sepsis secondary to SBO w/ perforated viscous and resultant peritonitis / Postoperative ileus / leukocytosis  - S/p abdominal fluid collection drainages 1/13 with removal of RLQ drain 1/20, repositioning of LUQ drain 1/20 and placement of new right subphrenic drain 1/20 - Appreciate surgery following  - S/P removal of RLQ drain 1/20, repositioning of LUQ drain 1/20 and placement of new right subphrenic drain 1/20 - CT scan done and will follow up with IR if drains can be removed.   Active problems:  Chest pain 1/20 secondary to demand ischemia - Demand ischemia from blood loss - Stable   Hypomagnesemia - Due to nutritional feeds, poor po intake  - Supplemented and WNL  Severe protein calorie malnutrition  - In the context of GI related issues - Pt malnourished but not underweight based on BMI - Body mass index is 20.23 kg/(m^2).  - Hope that TNA can be stopped today   Acute upper extremity DVT / Acute blood loss anemia  - US DVT right distal axillary vein, subclavian vein.  - Stopped heparin drip due to drop in Hg 1/23.  - Pt transfused two units PRBC 1/23, one unit 1/24 - IVC filters not placed in arm  Post operative respiratory failure with hypoxemia - Patient required ventilatory support after exploratory laparotomy 12/01/2015.  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  Family Communication:  Family not at the bedside  Disposition Plan: to SNF once abscesses improved.   IV access:  Peripheral IV  Procedures and diagnostic studies from 12/05/2015:  Ct Abdomen Pelvis W Contrast 12/25/2015  Stable bilateral pleural effusions are noted with adjacent subsegmental atelectasis, left greater than right. Right para Padda crescent shaped fluid collection is significantly smaller compared to prior exam, status post placement of new drainage catheter, tip of which is seen over dome of right hepatic lobe. No change seen in position of drainage catheter within left perisplenic fluid collection, which is unchanged in size compared to prior exam. There is been interval removal of drainage catheter from right lateral fluid collection noted on prior exam, with residual fluid collection measuring 4.6 x 2.8 cm which is not significantly changed compared to prior exam. Left lateral peritoneal fluid collection is noted measuring 7.6 x 1.5 cm which may be slightly smaller compared to prior exam. There are noted multiple smaller and probably loculated fluid collections seen posteriorly in the pelvis, with the largest single collection measuring 3.1 x 1.4 cm.   Ct Abdomen Pelvis Wo Contrast 12/07/2015 1. There is small bilateral pleural effusion right greater than left. Significant atelectasis or infiltrate in right lower lobe posteriorly. There is superimposed mild interstitial prominence bilateral lower lobes probable mild interstitial edema. 2. There is moderate perihepatic and perisplenic ascites. Moderate ascites noted bilateral paracolic gutters. Please note there is some loculation of perihepatic ascites with mild mass effect on the liver  contour please see axial images 32 and 15. Peritoneal inflammation or infection cannot be excluded. 3. No hydronephrosis or hydroureter. Stable bilateral renal cysts. 4. Postsurgical changes are noted anterior abdominal wall. Incomplete healed/open subcutaneous midline anterior abdominal wound. 5. Mild distended small bowel loops containing oral contrast material and some air-fluid levels. There is no transition point in caliber of small bowel. Findings most likely due to significant ileus or residual partial small bowel obstruction. There is some oral contrast material noted within distal colon. 6. Moderate pelvic ascites. Moderate gas noted within mid sigmoid colon probable mild ileus. 7. Significant anasarca infiltration of subcutaneous fat abdominal and pelvic wall. 8. 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 Electronically Signed By: Lahoma Crocker M.D. On: 12/07/2015 15:18   Dg Chest Port 1 View 12/06/2015 Stable bilateral pulmonary edema, with right basilar subsegmental atelectasis and associated pleural effusion. Interval placement of left internal jugular catheter with distal tip in SVC. No pneumothorax is noted. Electronically Signed By: Marijo Conception, M.D. On: 12/06/2015 16:10   Dg Chest Port 1 View 12/05/2015 Worsening of pulmonary interstitial infiltrates bilaterally. This may reflect pulmonary edema of cardiac or noncardiac cause or could reflect pneumonia. There are small bilateral pleural effusions.   Medical Consultants:  Surgery  CCM transfer care to [facility] 1-10 PCT 12/08/2015 IR  Other Consultants:  Nutrition PT  IAnti-Infectives:   Flagyl 11-30-2015 --> stopped Cefepime 12-02-2015 --> 12/10/2015    Leisa Lenz, MD  [name] Pager [phone]  Time spent in minutes: 15 minutes  If 7PM-7AM, please contact  night-coverage [email or web address] Password [email or web address] 12/27/2015, 12:48 PM   LOS: 36 days    HPI/Subjective: No acute overnight events. Pain  controlled.   Objective: Filed Vitals:   12/26/15 1438 12/26/15 2104 12/27/15 0556 12/27/15 0933  BP: 112/84 131/83 134/82 124/79  Pulse: 102 93 90 81  Temp: 97.6 F (36.4 C) 99.6 F (37.6 C) 98.8 F (37.1 C) 98.8 F (37.1 C)  TempSrc: Oral Oral Oral Oral  Resp: 16 16 18 15   Height:      Weight:      SpO2: 99% 99% 100% 100%    Intake/Output Summary (Last 24 hours) at 12/27/15 1248 Last data filed at 12/27/15 0800  Gross per 24 hour  Intake 1359.32 ml  Output   1181 ml  Net 178.32 ml    Exam:   General:  Pt is not in distress  Cardiovascular: RRR, (+) S1, S2   Respiratory: bilateral air entry, no wheezing   Abdomen: JP drains in place, (+) BS  Extremities: no edema, palpable pulses   Neuro: Nonfocal   Data Reviewed: Basic Metabolic Panel:  Recent Labs Lab 12/21/15 0435 12/22/15 0553 12/23/15 0320 12/24/15 0516 12/25/15 0450 12/27/15 0415 12/27/15 0940  NA 133* 132* 133*  --  135  --   --   K 4.5 4.3 4.5  --  5.1  --  4.8  CL 95* 93* 93*  --  95*  --   --   CO2 34* 33* 34*  --  32  --   --   GLUCOSE 85 72 167*  --  157*  --   --   BUN 18 17 18   --  20  --   --   CREATININE 0.73 0.69 0.71  --  0.73  --   --   CALCIUM 8.0* 8.0* 8.0*  --  8.9  --   --   MG 1.5* 1.7 1.8 1.5* 1.6* 1.7  --   PHOS 3.4  --  3.3  --  3.3  --   --    Liver Function Tests:  Recent Labs Lab 12/21/15 0435 12/25/15 0450  AST 23 21  ALT 12* 12*  ALKPHOS 112 118  BILITOT 0.7 0.3  PROT 6.2* 7.1  ALBUMIN 1.8* 1.9*   No results for input(s): LIPASE, AMYLASE in the last 168 hours. No results for input(s): AMMONIA in the last 168 hours. CBC:  Recent Labs Lab 12/22/15 0553 12/23/15 0320 12/24/15 0516 12/25/15 0450 12/26/15 0415  WBC 12.7* 11.6* 11.6* 14.1* 12.5*  NEUTROABS  --   --   --  11.3*  --   HGB 7.8* 8.0* 8.4* 7.8* 8.0*  HCT 24.7* 25.5* 26.9* 25.4* 25.8*  MCV 95.7 97.0 96.8 97.3 97.4  PLT 263 PLATELET CLUMPS NOTED ON SMEAR, COUNT APPEARS ADEQUATE 236 459*  226   Cardiac Enzymes: No results for input(s): CKTOTAL, CKMB, CKMBINDEX, TROPONINI in the last 168 hours. BNP: Invalid input(s): POCBNP CBG:  Recent Labs Lab 12/26/15 1203 12/26/15 1738 12/26/15 2103 12/27/15 0736 12/27/15 1125  GLUCAP 193* 149* 126* 106* 111*    No results found for this or any previous visit (from the past 240 hour(s)).   Scheduled Meds: . antiseptic oral rinse  7 mL Mouth Rinse q12n4p  . bacitracin   Topical Daily  . chlorhexidine  15 mL Mouth Rinse BID  . collagenase  1 application Topical Daily  . feeding supplement (ENSURE ENLIVE)  237 mL Oral TID BM  . fentaNYL (SUBLIMAZE) injection  50 mcg Intravenous Once  . insulin aspart  0-15 Units Subcutaneous TID WC  . insulin aspart  0-5 Units Subcutaneous QHS  . lip balm  1 application Topical BID  . magnesium oxide  400 mg Oral BID  . methocarbamol  1,000 mg Oral TID  . sodium chloride  3 mL Intravenous Q12H  . thiamine  100 mg Oral Daily   Continuous Infusions: . Marland KitchenTPN (CLINIMIX-E) Adult 35 mL/hr at 12/26/15 1711   And  . fat emulsion 120 mL (12/26/15 1711)  . Marland KitchenTPN (CLINIMIX-E) Adult     And  . fat emulsion

## 2015-12-27 NOTE — Progress Notes (Signed)
26 Days Post-Op  Subjective: Still complains of pain  Objective: Vital signs in last 24 hours: Temp:  [98.8 F (37.1 C)-99.6 F (37.6 C)] 98.8 F (37.1 C) (02/01 0933) Pulse Rate:  [81-93] 92 (02/01 1454) Resp:  [10-99] 99 (02/01 1454) BP: (98-134)/(59-83) 106/59 mmHg (02/01 1454) SpO2:  [95 %-100 %] 98 % (02/01 1454) Last BM Date: 12/25/15  Intake/Output from previous day: 01/31 0701 - 02/01 0700 In: 1479.3 [P.O.:600; I.V.:20; TPN:839.3] Out: 1355 [Urine:1300; Drains:55] Intake/Output this shift: Total I/O In: -  Out: 326 [Urine:326]  Resp: clear to auscultation bilaterally Cardio: regular rate and rhythm GI: soft, mild tenderness  Lab Results:   Recent Labs  12/25/15 0450 12/26/15 0415  WBC 14.1* 12.5*  HGB 7.8* 8.0*  HCT 25.4* 25.8*  PLT 459* 226   BMET  Recent Labs  12/25/15 0450 12/27/15 0940  NA 135  --   K 5.1 4.8  CL 95*  --   CO2 32  --   GLUCOSE 157*  --   BUN 20  --   CREATININE 0.73  --   CALCIUM 8.9  --    PT/INR  Recent Labs  12/27/15 0415  LABPROT 16.1*  INR 1.28   ABG No results for input(s): PHART, HCO3 in the last 72 hours.  Invalid input(s): PCO2, PO2  Studies/Results: Ct Abdomen Pelvis W Contrast  12/25/2015  CLINICAL DATA:  Abscess of abdominal cavity. EXAM: CT ABDOMEN AND PELVIS WITH CONTRAST TECHNIQUE: Multidetector CT imaging of the abdomen and pelvis was performed using the standard protocol following bolus administration of intravenous contrast. CONTRAST:  142mL OMNIPAQUE IOHEXOL 300 MG/ML  SOLN COMPARISON:  CT scan of December 14, 2015 and drainage procedure of December 15, 2015. FINDINGS: Severe degenerative disc disease is noted at L5-S1. Bilateral pleural effusions are noted with adjacent subsegmental atelectasis, with left greater than right. No gallstones are noted. Right perihepatic crescent shaped fluid collection is significantly smaller compared to prior exam status post placement of drainage catheter, which is  seen over the dome of right hepatic lobe. It currently measures 9.7 x 1.8 cm. Drainage catheter seen in left perisplenic fluid collection is unchanged in position, with no significant change in the size of this fluid collection pancreas appears normal. Bilateral renal cysts are unchanged. Adrenal glands appear normal. No hydronephrosis or renal obstruction is noted. Drainage catheter noted in right lateral fluid collection on prior study has been removed. 4.6 x 2.8 cm residual fluid collection is noted. Left lateral peritoneal fluid collection is noted which measures 7.6 x 1.5 cm which may be slightly smaller compared to prior exam. There appears to be no evidence of bowel obstruction. Large amount of stool is noted in the sigmoid colon and rectum. Urinary bladder appears normal. Multiple other smaller fluid collections are noted posteriorly in the pelvis, with the largest measuring 3.1 x 1.4 cm. IMPRESSION: Stable bilateral pleural effusions are noted with adjacent subsegmental atelectasis, left greater than right. Right para Padda crescent shaped fluid collection is significantly smaller compared to prior exam, status post placement of new drainage catheter, tip of which is seen over dome of right hepatic lobe. No change seen in position of drainage catheter within left perisplenic fluid collection, which is unchanged in size compared to prior exam. There is been interval removal of drainage catheter from right lateral fluid collection noted on prior exam, with residual fluid collection measuring 4.6 x 2.8 cm which is not significantly changed compared to prior exam. Left lateral peritoneal fluid  collection is noted measuring 7.6 x 1.5 cm which may be slightly smaller compared to prior exam. There are noted multiple smaller and probably loculated fluid collections seen posteriorly in the pelvis, with the largest single collection measuring 3.1 x 1.4 cm. Electronically Signed   By: Marijo Conception, M.D.   On:  12/25/2015 16:33    Anti-infectives: Anti-infectives    Start     Dose/Rate Route Frequency Ordered Stop   12/27/15 1415  vancomycin (VANCOCIN) IVPB 1000 mg/200 mL premix     1,000 mg 200 mL/hr over 60 Minutes Intravenous  Once 12/27/15 1412     12/15/15 0800  metroNIDAZOLE (FLAGYL) tablet 500 mg  Status:  Discontinued     500 mg Oral Every 6 hours 12/15/15 0714 12/20/15 1055   12/08/15 0800  metroNIDAZOLE (FLAGYL) IVPB 500 mg  Status:  Discontinued     500 mg 100 mL/hr over 60 Minutes Intravenous Every 6 hours 12/08/15 0737 12/15/15 0714   12/03/15 1800  vancomycin (VANCOCIN) IVPB 750 mg/150 ml premix  Status:  Discontinued     750 mg 150 mL/hr over 60 Minutes Intravenous Every 24 hours 12/03/15 1715 12/04/15 0911   12/02/15 1800  ceFEPIme (MAXIPIME) 1 g in dextrose 5 % 50 mL IVPB     1 g 100 mL/hr over 30 Minutes Intravenous Every 12 hours 12/02/15 1014 12/09/15 1759   12/02/15 0600  vancomycin (VANCOCIN) IVPB 1000 mg/200 mL premix  Status:  Discontinued     1,000 mg 200 mL/hr over 60 Minutes Intravenous 3 times per day 12/02/15 0547 12/02/15 0954   12/01/15 2200  ceFEPIme (MAXIPIME) 1 g in dextrose 5 % 50 mL IVPB  Status:  Discontinued     1 g 100 mL/hr over 30 Minutes Intravenous 3 times per day 12/01/15 1455 12/02/15 1014   11/30/15 1400  ceFEPIme (MAXIPIME) 1 g in dextrose 5 % 50 mL IVPB  Status:  Discontinued     1 g 100 mL/hr over 30 Minutes Intravenous 3 times per day 11/30/15 0913 12/01/15 1455   11/30/15 1000  metroNIDAZOLE (FLAGYL) IVPB 500 mg     500 mg 100 mL/hr over 60 Minutes Intravenous Every 8 hours 11/30/15 0858 12/07/15 0226   11/30/15 0645  vancomycin (VANCOCIN) IVPB 750 mg/150 ml premix  Status:  Discontinued     750 mg 150 mL/hr over 60 Minutes Intravenous 3 times per day 11/30/15 0630 12/02/15 0547   11/30/15 0645  imipenem-cilastatin (PRIMAXIN) 500 mg in sodium chloride 0.9 % 100 mL IVPB  Status:  Discontinued     500 mg 200 mL/hr over 30 Minutes  Intravenous 3 times per day 11/30/15 0630 11/30/15 0859      Assessment/Plan: s/p Procedure(s): EXPLORATORY LAPAROTOMY WITH RADICAL PERITONEAL DEBRIDEMENT, CLOSURE OF SMALL BOWEL PERFORATION AND LYSIS OF ADHESIONS. (N/A) Advance diet  Plan for aspiration of intraabdominal fluid collections today PT Wean tpn as tolerated  LOS: 36 days    TOTH III,Zafirah Vanzee S 12/27/2015

## 2015-12-27 NOTE — Sedation Documentation (Signed)
Patient denies pain and is resting comfortably.  

## 2015-12-27 NOTE — Clinical Social Work Note (Signed)
CSW continuing to follow for d/c needs; no anticipated d/c date.    Cindra Presume, LCSW [phone] [facility], 5W [id number] Licensed Clinical Social Worker

## 2015-12-27 NOTE — Progress Notes (Signed)
PARENTERAL NUTRITION CONSULT NOTE - FOLLOW-UP  Pharmacy Consult for TPN Indication: Bowel obstruction, post op ileus  Allergies  Allergen Reactions  . Zetia [Ezetimibe] Anaphylaxis and Swelling    Tongue and throat   . Atorvastatin Other (See Comments)    Malaise & muscle weakness  . Penicillins Other (See Comments)    Unknown.  Tolerates cefepime  . Pravastatin Sodium     Pravastatin 40 mg qday and 40 mg q M/W/F caused muscle aches  . Lisinopril Rash  . Promethazine Rash   Patient Measurements: Height: 6\' 2"  (188 cm) Weight: 157 lb 10.1 oz (71.5 kg) IBW/kg (Calculated) : 82.2 Usual Weight: 137-140 lbs (62.2-63.6 kg)  Vital Signs: Temp: 98.8 F (37.1 C) (02/01 0556) Temp Source: Oral (02/01 0556) BP: 134/82 mmHg (02/01 0556) Pulse Rate: 90 (02/01 0556) Intake/Output from previous day: 01/31 0701 - 02/01 0700 In: 1479.3 [P.O.:600; I.V.:20; TPN:839.3] Out: 1355 [Urine:1300; Drains:55] Intake/Output from this shift:   Labs:  Recent Labs  12/25/15 0450 12/26/15 0415 12/27/15 0415  WBC 14.1* 12.5*  --   HGB 7.8* 8.0*  --   HCT 25.4* 25.8*  --   PLT 459* 226  --   INR  --   --  1.28    Recent Labs  12/25/15 0450 12/27/15 0415  NA 135  --   K 5.1  --   CL 95*  --   CO2 32  --   GLUCOSE 157*  --   BUN 20  --   CREATININE 0.73  --   CALCIUM 8.9  --   MG 1.6* 1.7  PHOS 3.3  --   PROT 7.1  --   ALBUMIN 1.9*  --   AST 21  --   ALT 12*  --   ALKPHOS 118  --   BILITOT 0.3  --   PREALBUMIN 4.9*  --   TRIG 115  --   Corr Ca 9.8 Estimated Creatinine Clearance: 95.6 mL/min (by C-G formula based on Cr of 0.73).    Recent Labs  12/26/15 1738 12/26/15 2103 12/27/15 0736  GLUCAP 149* 126* 106*     Medical History: History reviewed. No pertinent past medical history. Medications:  Infusions:  . Marland KitchenTPN (CLINIMIX-E) Adult 35 mL/hr at 12/26/15 1711   And  . fat emulsion 120 mL (12/26/15 1711)   Insulin Requirements in the past 24 hours: ~ 7 units  SSI/24h +  10 units Regular Insulin in TPN/24h  Current Nutrition:  Regular diet + Ensure TID (eating 25-100% over the last 48 hours) Clinimix-E 5/15 at 35 mL/hr Fat Emulsion 20% at 5 mL/hr  IVF: none  Central access: PICC placed 1/3 TPN start date: 1/3  ASSESSMENT                                                                                                          HPI: 70 yoM presented to ED on 12/27 with abdominal pain.  PMH includes HTN, HLD, PVD - s/p fem bypass, CAD s/p NSTEMI 2014, cardiomyopathy, DM, diabetic peripheral neuropathy, seizures,  and chronic pain on chronic opioids.  Surgical history significant for open AAA repair.  CT shows partial SBO, likely adhesions.  Patient refused surgery, until 1/6 s/p repair of perforated viscus.  Plan for conservative management with TPN, bowel rest.   Significant events:  1/1: TPN per pharmacy ordered - unable to start without central access.  1/3: PICC line placed; TPN started 1/5: Acute encephalopathy with fevers (102F) overnight- sepsis protocol initiated.  Broad-spectrum abx started.  1/6: Consented to laparotomy w/ findings of perforated viscus and peritonitis. Txfer to ICU   1/7:  AKI 1/10: NGT removed 1/11: started clear liquid diet 1/13 NPO again for CT guided drain placement for perihepatic fluid collection suspicious for abscess. FL diet post-procedure 1/16: per CCS, no bowel sounds noted today, patient needs to be walked as much as possible 1/18: started full liquid diet, tolerating small amounts. AKI resolved. 1/19: IR: RLQ and RUO drains removed, LUQ drain repositioned 1/22: advanced diet to regular on 1/21 1/23 drains continue to put out yellow purulent drainage 1/24 begin wean of TPN to encourage PO appetite  1/25: 48 hr calorie count ordered.  Plan is to re-CT once drain output 30-50 ml or less 1/27: mild hypoglycemic episode (CBG 62)-- decreased insulin in TPN bag from 15 units to 10 units 1/29: per Dr. Excell Seltzer,  continue TPN for now until oral intake is significantly better 1/31 : Per Dr. Marlou Starks, passing some flatus and tolerating some food. Continue TPN.  Today 12/27/2015:   Glucose (goal <150): 106-193 (oral intake improving). Hx DM on glipizide PTA.   Electrolytes - (last checked 1/30) K, Phos, Na WNL. Cl low.  ( last checked 2/1) Mg 1.7 despite IV supplementation for past several days.  CoCa WNL  Renal -  SCr remains WNL  LFTs - all below ULN 1/30  TGs -  remain wnl 1/30  Prealbumin - consistently low, but trending up.  most recently 4.9 (1/30) Dietary intake, appetite was improving, but now complaining of feeling worse. Eating 25-100% of meals.     NUTRITIONAL GOALS                                                                                             RD recs: 1600-1800 Kcal/day, 65-80 g protein/day, > 2 L fluid/day  Clinimix E 5/15 at a goal rate of 65 ml/hr + 20% fat emulsion at 10 ml/hr provides: 78 g/day protein, 1588 Kcal/day.  PLAN                                                                                                Continue TPN at 1/2 rate per surgery in hopes of stimulating appetite:  Clinimix E 5/15 at 35 ml/hr   20%  Fat Emulsion at 5 mL/hr  Continue regular insulin in TPN to 10 units / 24 hr  Continue Standard MVI, MTE in TPN bag.  Continue moderate SSI scale with meals and at bedtime.  MagOx 400mg  po BID.   Routine TPN labs Mondays and Thursdays.  Follow PO intake. Consider discontinuing TPN once patient consistently getting at least 60% of calorie needs from oral intake.  Royetta Asal, PharmD, BCPS Pager [phone] 12/27/2015 8:43 AM

## 2015-12-28 LAB — COMPREHENSIVE METABOLIC PANEL
ALT: 17 U/L (ref 17–63)
ANION GAP: 7 (ref 5–15)
AST: 26 U/L (ref 15–41)
Albumin: 2 g/dL — ABNORMAL LOW (ref 3.5–5.0)
Alkaline Phosphatase: 144 U/L — ABNORMAL HIGH (ref 38–126)
BUN: 18 mg/dL (ref 6–20)
CHLORIDE: 92 mmol/L — AB (ref 101–111)
CO2: 32 mmol/L (ref 22–32)
CREATININE: 0.65 mg/dL (ref 0.61–1.24)
Calcium: 9.1 mg/dL (ref 8.9–10.3)
Glucose, Bld: 169 mg/dL — ABNORMAL HIGH (ref 65–99)
POTASSIUM: 4.6 mmol/L (ref 3.5–5.1)
SODIUM: 131 mmol/L — AB (ref 135–145)
Total Bilirubin: 0.4 mg/dL (ref 0.3–1.2)
Total Protein: 7.3 g/dL (ref 6.5–8.1)

## 2015-12-28 LAB — GLUCOSE, CAPILLARY
GLUCOSE-CAPILLARY: 134 mg/dL — AB (ref 65–99)
GLUCOSE-CAPILLARY: 137 mg/dL — AB (ref 65–99)
GLUCOSE-CAPILLARY: 155 mg/dL — AB (ref 65–99)
Glucose-Capillary: 150 mg/dL — ABNORMAL HIGH (ref 65–99)
Glucose-Capillary: 161 mg/dL — ABNORMAL HIGH (ref 65–99)

## 2015-12-28 LAB — PHOSPHORUS: PHOSPHORUS: 3.4 mg/dL (ref 2.5–4.6)

## 2015-12-28 LAB — MAGNESIUM: MAGNESIUM: 1.6 mg/dL — AB (ref 1.7–2.4)

## 2015-12-28 MED ORDER — MAGNESIUM SULFATE 2 GM/50ML IV SOLN
2.0000 g | Freq: Once | INTRAVENOUS | Status: AC
  Administered 2015-12-28: 2 g via INTRAVENOUS
  Filled 2015-12-28: qty 50

## 2015-12-28 NOTE — Progress Notes (Signed)
Patient ID: [name] [name], male   DOB: 1951/06/09, 65 y.o.   MRN: [id number]    Referring Physician(s): CCS  Chief Complaint: Intra-abdominal abscesses  Subjective: Pt stable with no new complaints  Allergies: Zetia; Atorvastatin; Penicillins; Pravastatin sodium; Lisinopril; and Promethazine  Medications: Prior to Admission medications   Medication Sig Start Date End Date Taking? Authorizing Provider  albuterol (PROVENTIL HFA;VENTOLIN HFA) 108 (90 BASE) MCG/ACT inhaler Inhale 2 puffs into the lungs every 6 (six) hours as needed for wheezing or shortness of breath. 11/11/13  Yes Ripudeep Krystal Eaton, MD  aspirin EC 81 MG tablet Take 1 tablet (81 mg total) by mouth daily. 11/06/15  Yes Sherren Mocha, MD  collagenase (SANTYL) ointment Apply thin layer to wound bed of left foot daily Patient taking differently: Apply 1 application topically daily. Apply thin layer to wound bed of left foot daily 10/26/15  Yes Rosetta Posner, MD  gabapentin (NEURONTIN) 300 MG capsule Take 300 mg by mouth 3 (three) times daily. 09/30/15  Yes Historical Provider, MD  glipiZIDE (GLUCOTROL) 10 MG tablet Take 10 mg by mouth 2 (two) times daily before a meal.   Yes Historical Provider, MD  ketoconazole (NIZORAL) 2 % cream Apply 1 application topically daily as needed for irritation.   Yes Historical Provider, MD  Multiple Vitamins-Minerals (MULTIVITAMIN WITH MINERALS) tablet Take 4 tablets by mouth 3 (three) times daily.   Yes Historical Provider, MD  nitroGLYCERIN (NITROSTAT) 0.4 MG SL tablet Place 0.4 mg under the tongue every 5 (five) minutes as needed for chest pain.   Yes Historical Provider, MD  oxyCODONE (ROXICODONE) 15 MG immediate release tablet Take 3 tablets (45 mg total) by mouth every 6 (six) hours as needed (severe pain). 07/30/15  Yes Kimberly A Trinh, PA-C  PATADAY 0.2 % SOLN Place 1 drop into both eyes daily.  08/11/14  Yes Historical Provider, MD  polyethylene glycol (MIRALAX / GLYCOLAX) packet Take 17 g by  mouth daily as needed for mild constipation or moderate constipation.    Yes Historical Provider, MD  triamcinolone cream (KENALOG) 0.1 % Apply 1 application topically 2 (two) times daily as needed (for eczema).   Yes Historical Provider, MD  zolpidem (AMBIEN) 10 MG tablet Take 10 mg by mouth at bedtime as needed for sleep.   Yes Historical Provider, MD  silver sulfADIAZINE (SILVADENE) 1 % cream Apply 1 application topically daily. Patient not taking: Reported on 11/14/2015 07/25/15   Rosetta Posner, MD    Vital Signs: BP 121/86 mmHg  Pulse 93  Temp(Src) 98.5 F (36.9 C) (Oral)  Resp 16  Ht 6\' 2"  (1.88 m)  Wt 157 lb 10.1 oz (71.5 kg)  BMI 20.23 kg/m2  SpO2 95%  Physical Exam: Abd: soft, right drain with serous fluid with some debride noted.  55cc/24hrs.  Left JP bulb with seropurulent drainage, 60cc/24hr.  Aspiration sites clean with band-aids present.  Both drain sites are clean.  Imaging: Ct Abdomen Pelvis W Contrast  12/25/2015  CLINICAL DATA:  Abscess of abdominal cavity. EXAM: CT ABDOMEN AND PELVIS WITH CONTRAST TECHNIQUE: Multidetector CT imaging of the abdomen and pelvis was performed using the standard protocol following bolus administration of intravenous contrast. CONTRAST:  152mL OMNIPAQUE IOHEXOL 300 MG/ML  SOLN COMPARISON:  CT scan of December 14, 2015 and drainage procedure of December 15, 2015. FINDINGS: Severe degenerative disc disease is noted at L5-S1. Bilateral pleural effusions are noted with adjacent subsegmental atelectasis, with left greater than right. No gallstones are noted. Right  perihepatic crescent shaped fluid collection is significantly smaller compared to prior exam status post placement of drainage catheter, which is seen over the dome of right hepatic lobe. It currently measures 9.7 x 1.8 cm. Drainage catheter seen in left perisplenic fluid collection is unchanged in position, with no significant change in the size of this fluid collection pancreas appears normal.  Bilateral renal cysts are unchanged. Adrenal glands appear normal. No hydronephrosis or renal obstruction is noted. Drainage catheter noted in right lateral fluid collection on prior study has been removed. 4.6 x 2.8 cm residual fluid collection is noted. Left lateral peritoneal fluid collection is noted which measures 7.6 x 1.5 cm which may be slightly smaller compared to prior exam. There appears to be no evidence of bowel obstruction. Large amount of stool is noted in the sigmoid colon and rectum. Urinary bladder appears normal. Multiple other smaller fluid collections are noted posteriorly in the pelvis, with the largest measuring 3.1 x 1.4 cm. IMPRESSION: Stable bilateral pleural effusions are noted with adjacent subsegmental atelectasis, left greater than right. Right para Padda crescent shaped fluid collection is significantly smaller compared to prior exam, status post placement of new drainage catheter, tip of which is seen over dome of right hepatic lobe. No change seen in position of drainage catheter within left perisplenic fluid collection, which is unchanged in size compared to prior exam. There is been interval removal of drainage catheter from right lateral fluid collection noted on prior exam, with residual fluid collection measuring 4.6 x 2.8 cm which is not significantly changed compared to prior exam. Left lateral peritoneal fluid collection is noted measuring 7.6 x 1.5 cm which may be slightly smaller compared to prior exam. There are noted multiple smaller and probably loculated fluid collections seen posteriorly in the pelvis, with the largest single collection measuring 3.1 x 1.4 cm. Electronically Signed   By: Marijo Conception, M.D.   On: 12/25/2015 16:33   Ct Image Guided Drainage Percut Cath  Peritoneal Retroperit  12/27/2015  INDICATION: Abdominal abscess on the right and left flanks EXAM: CT GUIDED DRAINAGE OF BILATERAL ABSCESS MEDICATIONS: The patient is currently admitted to the  hospital and receiving intravenous antibiotics. The antibiotics were administered within an appropriate time frame prior to the initiation of the procedure. ANESTHESIA/SEDATION: 0.5 mg IV Versed 25 mcg IV Fentanyl Moderate Sedation Time:  13 The patient was continuously monitored during the procedure by the interventional radiology nurse under my direct supervision. COMPLICATIONS: None immediate. TECHNIQUE: Informed written consent was obtained from the patient after a thorough discussion of the procedural risks, benefits and alternatives. All questions were addressed. Maximal Sterile Barrier Technique was utilized including caps, mask, sterile gowns, sterile gloves, sterile drape, hand hygiene and skin antiseptic. A timeout was performed prior to the initiation of the procedure. PROCEDURE: The abdominal region was prepped with ChloraPrep in a sterile fashion, and a sterile drape was applied covering the operative field. A sterile gown and sterile gloves were used for the procedure. Local anesthesia was provided with 1% Lidocaine. Under CT guidance, a Yueh Angiocath was inserted into the left flank abscess. 5 cc of pus was aspirated. Under CT guidance, a Teressa Lower Angiocath was placed in the right flank abscess and an additional 5 cc of pus was aspirated. Samples were sent for culture. FINDINGS: Images document you we Angiocath placement in the right and left flank abscesses. Post aspiration imaging demonstrates no hemorrhage and some improvement in the size of the abscesses. IMPRESSION: Successful right and  left pelvic abscess drainage utilizing UE Angiocath. Electronically Signed   By: Marybelle Killings M.D.   On: 12/27/2015 16:13   Ct Image Guided Drainage Percut Cath  Peritoneal Retroperit  12/27/2015  INDICATION: Abdominal abscess on the right and left flanks EXAM: CT GUIDED DRAINAGE OF BILATERAL ABSCESS MEDICATIONS: The patient is currently admitted to the hospital and receiving intravenous antibiotics. The  antibiotics were administered within an appropriate time frame prior to the initiation of the procedure. ANESTHESIA/SEDATION: 0.5 mg IV Versed 25 mcg IV Fentanyl Moderate Sedation Time:  13 The patient was continuously monitored during the procedure by the interventional radiology nurse under my direct supervision. COMPLICATIONS: None immediate. TECHNIQUE: Informed written consent was obtained from the patient after a thorough discussion of the procedural risks, benefits and alternatives. All questions were addressed. Maximal Sterile Barrier Technique was utilized including caps, mask, sterile gowns, sterile gloves, sterile drape, hand hygiene and skin antiseptic. A timeout was performed prior to the initiation of the procedure. PROCEDURE: The abdominal region was prepped with ChloraPrep in a sterile fashion, and a sterile drape was applied covering the operative field. A sterile gown and sterile gloves were used for the procedure. Local anesthesia was provided with 1% Lidocaine. Under CT guidance, a Yueh Angiocath was inserted into the left flank abscess. 5 cc of pus was aspirated. Under CT guidance, a Teressa Lower Angiocath was placed in the right flank abscess and an additional 5 cc of pus was aspirated. Samples were sent for culture. FINDINGS: Images document you we Angiocath placement in the right and left flank abscesses. Post aspiration imaging demonstrates no hemorrhage and some improvement in the size of the abscesses. IMPRESSION: Successful right and left pelvic abscess drainage utilizing UE Angiocath. Electronically Signed   By: Marybelle Killings M.D.   On: 12/27/2015 16:13    Labs:  CBC:  Recent Labs  12/23/15 0320 12/24/15 0516 12/25/15 0450 12/26/15 0415  WBC 11.6* 11.6* 14.1* 12.5*  HGB 8.0* 8.4* 7.8* 8.0*  HCT 25.5* 26.9* 25.4* 25.8*  PLT PLATELET CLUMPS NOTED ON SMEAR, COUNT APPEARS ADEQUATE 236 459* 226    COAGS:  Recent Labs  07/26/15 1415 12/06/15 0925 12/08/15 0340 12/27/15 0415    INR 1.09 1.89* 1.82* 1.28  APTT 37 45*  --   --     BMP:  Recent Labs  12/22/15 0553 12/23/15 0320 12/25/15 0450 12/27/15 0940 12/28/15 0620  NA 132* 133* 135  --  131*  K 4.3 4.5 5.1 4.8 4.6  CL 93* 93* 95*  --  92*  CO2 33* 34* 32  --  32  GLUCOSE 72 167* 157*  --  169*  BUN 17 18 20   --  18  CALCIUM 8.0* 8.0* 8.9  --  9.1  CREATININE 0.69 0.71 0.73  --  0.65  GFRNONAA >60 >60 >60  --  >60  GFRAA >60 >60 >60  --  >60    LIVER FUNCTION TESTS:  Recent Labs  12/18/15 0630 12/21/15 0435 12/25/15 0450 12/28/15 0620  BILITOT 0.4 0.7 0.3 0.4  AST 21 23 21 26   ALT 11* 12* 12* 17  ALKPHOS 111 112 118 144*  PROT 6.7 6.2* 7.1 7.3  ALBUMIN 1.8* 1.8* 1.9* 2.0*    Assessment and Plan: S/p abdominal fluid collection drainages 1/13 with removal of RLQ drain 1/20, repositioning of LUQ drain 1/20 and placement of new right subphrenic drain 1/20, s/p bilateral fluid collection aspiration by Dr. Barbie Banner 12-27-15 -drain output still remains elevated.  Cont  both drains for now -cont irrigation. -cx from aspirates are both NGTD.  Both were purulent per Dr. Barbie Banner.  Electronically Signed: Henreitta Cea 12/28/2015, 2:06 PM   I spent a total of 15 Minutes at the the patient's bedside AND on the patient's hospital floor or unit, greater than 50% of which was counseling/coordinating care for intra-abdominal fluid collections, s/p perc drains

## 2015-12-28 NOTE — Progress Notes (Signed)
27 Days Post-Op  Subjective: Still has pain. Unchanged. No growth from aspirated fluid yet  Objective: Vital signs in last 24 hours: Temp:  [98.5 F (36.9 C)-99.1 F (37.3 C)] 99.1 F (37.3 C) (02/02 0526) Pulse Rate:  [87-96] 96 (02/02 0526) Resp:  [10-99] 16 (02/02 0526) BP: (98-133)/(59-83) 122/83 mmHg (02/02 0526) SpO2:  [95 %-100 %] 100 % (02/02 0526) Last BM Date: 12/28/15  Intake/Output from previous day: 02/01 0701 - 02/02 0700 In: 60  Out: 1541 [Urine:1426; Drains:115] Intake/Output this shift: Total I/O In: 3 [I.V.:3] Out: 450 [Urine:450]  Resp: clear to auscultation bilaterally Cardio: regular rate and rhythm GI: soft, mild tenderness. having bm'[name] [name]  Lab Results:   Recent Labs  12/26/15 0415  WBC 12.5*  HGB 8.0*  HCT 25.8*  PLT 226   BMET  Recent Labs  12/27/15 0940 12/28/15 0620  NA  --  131*  K 4.8 4.6  CL  --  92*  CO2  --  32  GLUCOSE  --  169*  BUN  --  18  CREATININE  --  0.65  CALCIUM  --  9.1   PT/INR  Recent Labs  12/27/15 0415  LABPROT 16.1*  INR 1.28   ABG No results for input([name] [name]): PHART, HCO3 in the last 72 hours.  Invalid input([name] [name]): PCO2, PO2  Studies/Results: Ct Image Guided Drainage Percut Cath  Peritoneal Retroperit  12/27/2015  INDICATION: Abdominal abscess on the right and left flanks EXAM: CT GUIDED DRAINAGE OF BILATERAL ABSCESS MEDICATIONS: The patient is currently admitted to the hospital and receiving intravenous antibiotics. The antibiotics were administered within an appropriate time frame prior to the initiation of the procedure. ANESTHESIA/SEDATION: 0.5 mg IV Versed 25 mcg IV Fentanyl Moderate Sedation Time:  13 The patient was continuously monitored during the procedure by the interventional radiology nurse under my direct supervision. COMPLICATIONS: None immediate. TECHNIQUE: Informed written consent was obtained from the patient after a thorough discussion of the procedural risks, benefits and alternatives. All  questions were addressed. Maximal Sterile Barrier Technique was utilized including caps, mask, sterile gowns, sterile gloves, sterile drape, hand hygiene and skin antiseptic. A timeout was performed prior to the initiation of the procedure. PROCEDURE: The abdominal region was prepped with ChloraPrep in a sterile fashion, and a sterile drape was applied covering the operative field. A sterile gown and sterile gloves were used for the procedure. Local anesthesia was provided with 1% Lidocaine. Under CT guidance, a Yueh Angiocath was inserted into the left flank abscess. 5 cc of pus was aspirated. Under CT guidance, a Teressa Lower Angiocath was placed in the right flank abscess and an additional 5 cc of pus was aspirated. Samples were sent for culture. FINDINGS: Images document you we Angiocath placement in the right and left flank abscesses. Post aspiration imaging demonstrates no hemorrhage and some improvement in the size of the abscesses. IMPRESSION: Successful right and left pelvic abscess drainage utilizing UE Angiocath. Electronically Signed   By: Marybelle Killings M.D.   On: 12/27/2015 16:13   Ct Image Guided Drainage Percut Cath  Peritoneal Retroperit  12/27/2015  INDICATION: Abdominal abscess on the right and left flanks EXAM: CT GUIDED DRAINAGE OF BILATERAL ABSCESS MEDICATIONS: The patient is currently admitted to the hospital and receiving intravenous antibiotics. The antibiotics were administered within an appropriate time frame prior to the initiation of the procedure. ANESTHESIA/SEDATION: 0.5 mg IV Versed 25 mcg IV Fentanyl Moderate Sedation Time:  13 The patient was continuously monitored during the procedure by the  interventional radiology nurse under my direct supervision. COMPLICATIONS: None immediate. TECHNIQUE: Informed written consent was obtained from the patient after a thorough discussion of the procedural risks, benefits and alternatives. All questions were addressed. Maximal Sterile Barrier Technique  was utilized including caps, mask, sterile gowns, sterile gloves, sterile drape, hand hygiene and skin antiseptic. A timeout was performed prior to the initiation of the procedure. PROCEDURE: The abdominal region was prepped with ChloraPrep in a sterile fashion, and a sterile drape was applied covering the operative field. A sterile gown and sterile gloves were used for the procedure. Local anesthesia was provided with 1% Lidocaine. Under CT guidance, a Yueh Angiocath was inserted into the left flank abscess. 5 cc of pus was aspirated. Under CT guidance, a Teressa Lower Angiocath was placed in the right flank abscess and an additional 5 cc of pus was aspirated. Samples were sent for culture. FINDINGS: Images document you we Angiocath placement in the right and left flank abscesses. Post aspiration imaging demonstrates no hemorrhage and some improvement in the size of the abscesses. IMPRESSION: Successful right and left pelvic abscess drainage utilizing UE Angiocath. Electronically Signed   By: Marybelle Killings M.D.   On: 12/27/2015 16:13    Anti-infectives: Anti-infectives    Start     Dose/Rate Route Frequency Ordered Stop   12/27/15 1415  vancomycin (VANCOCIN) IVPB 1000 mg/200 mL premix     1,000 mg 200 mL/hr over 60 Minutes Intravenous  Once 12/27/15 1412 12/27/15 1523   12/15/15 0800  metroNIDAZOLE (FLAGYL) tablet 500 mg  Status:  Discontinued     500 mg Oral Every 6 hours 12/15/15 0714 12/20/15 1055   12/08/15 0800  metroNIDAZOLE (FLAGYL) IVPB 500 mg  Status:  Discontinued     500 mg 100 mL/hr over 60 Minutes Intravenous Every 6 hours 12/08/15 0737 12/15/15 0714   12/03/15 1800  vancomycin (VANCOCIN) IVPB 750 mg/150 ml premix  Status:  Discontinued     750 mg 150 mL/hr over 60 Minutes Intravenous Every 24 hours 12/03/15 1715 12/04/15 0911   12/02/15 1800  ceFEPIme (MAXIPIME) 1 g in dextrose 5 % 50 mL IVPB     1 g 100 mL/hr over 30 Minutes Intravenous Every 12 hours 12/02/15 1014 12/09/15 1759   12/02/15  0600  vancomycin (VANCOCIN) IVPB 1000 mg/200 mL premix  Status:  Discontinued     1,000 mg 200 mL/hr over 60 Minutes Intravenous 3 times per day 12/02/15 0547 12/02/15 0954   12/01/15 2200  ceFEPIme (MAXIPIME) 1 g in dextrose 5 % 50 mL IVPB  Status:  Discontinued     1 g 100 mL/hr over 30 Minutes Intravenous 3 times per day 12/01/15 1455 12/02/15 1014   11/30/15 1400  ceFEPIme (MAXIPIME) 1 g in dextrose 5 % 50 mL IVPB  Status:  Discontinued     1 g 100 mL/hr over 30 Minutes Intravenous 3 times per day 11/30/15 0913 12/01/15 1455   11/30/15 1000  metroNIDAZOLE (FLAGYL) IVPB 500 mg     500 mg 100 mL/hr over 60 Minutes Intravenous Every 8 hours 11/30/15 0858 12/07/15 0226   11/30/15 0645  vancomycin (VANCOCIN) IVPB 750 mg/150 ml premix  Status:  Discontinued     750 mg 150 mL/hr over 60 Minutes Intravenous 3 times per day 11/30/15 0630 12/02/15 0547   11/30/15 0645  imipenem-cilastatin (PRIMAXIN) 500 mg in sodium chloride 0.9 % 100 mL IVPB  Status:  Discontinued     500 mg 200 mL/hr over 30 Minutes Intravenous  3 times per day 11/30/15 0630 11/30/15 0859      Assessment/Plan: [name] [name]/p Procedure([name] [name]): EXPLORATORY LAPAROTOMY WITH RADICAL PERITONEAL DEBRIDEMENT, CLOSURE OF SMALL BOWEL PERFORATION AND LYSIS OF ADHESIONS. (N/A) Advance diet  Wean tpn PT Continue drains  LOS: 37 days    [name] [name] [name],[name] [name] [name] 12/28/2015

## 2015-12-28 NOTE — Progress Notes (Signed)
PARENTERAL NUTRITION CONSULT NOTE - FOLLOW-UP  Pharmacy Consult for TPN Indication: Bowel obstruction, post op ileus  Allergies  Allergen Reactions  . Zetia [Ezetimibe] Anaphylaxis and Swelling    Tongue and throat   . Atorvastatin Other (See Comments)    Malaise & muscle weakness  . Penicillins Other (See Comments)    Unknown.  Tolerates cefepime  . Pravastatin Sodium     Pravastatin 40 mg qday and 40 mg q M/W/F caused muscle aches  . Lisinopril Rash  . Promethazine Rash   Patient Measurements: Height: 6\' 2"  (188 cm) Weight: 157 lb 10.1 oz (71.5 kg) IBW/kg (Calculated) : 82.2 Usual Weight: 137-140 lbs (62.2-63.6 kg)  Vital Signs: Temp: 99.1 F (37.3 C) (02/02 0526) Temp Source: Oral (02/02 0526) BP: 122/83 mmHg (02/02 0526) Pulse Rate: 96 (02/02 0526) Intake/Output from previous day: 02/01 0701 - 02/02 0700 In: 60  Out: [id number] [Urine:1426; Drains:115] Intake/Output from this shift: Total I/O In: 103 [P.O.:100; I.V.:3] Out: 450 [Urine:450] Labs:  Recent Labs  12/26/15 0415 12/27/15 0415  WBC 12.5*  --   HGB 8.0*  --   HCT 25.8*  --   PLT 226  --   INR  --  1.28    Recent Labs  12/27/15 0415 12/27/15 0940 12/28/15 0620  NA  --   --  131*  K  --  4.8 4.6  CL  --   --  92*  CO2  --   --  32  GLUCOSE  --   --  169*  BUN  --   --  18  CREATININE  --   --  0.65  CALCIUM  --   --  9.1  MG 1.7  --  1.6*  PHOS  --   --  3.4  PROT  --   --  7.3  ALBUMIN  --   --  2.0*  AST  --   --  26  ALT  --   --  17  ALKPHOS  --   --  144*  BILITOT  --   --  0.4  Corr Ca 9.8 Estimated Creatinine Clearance: 95.6 mL/min (by C-G formula based on Cr of 0.65).    Recent Labs  12/28/15 0026 12/28/15 0735 12/28/15 1142  GLUCAP 155* 150* 161*     Medical History: History reviewed. No pertinent past medical history. Medications:  Infusions:  . Marland KitchenTPN (CLINIMIX-E) Adult 35 mL/hr at 12/27/15 1722   And  . fat emulsion 120 mL (12/27/15 1723)   Insulin  Requirements in the past 24 hours: ~ 2 units SSI/24h +  10 units Regular Insulin in TPN/24h  Current Nutrition:  Regular diet + Ensure TID (eating 25--100% over the last 48 hours) Clinimix-E 5/15 at 35 mL/hr Fat Emulsion 20% at 5 mL/hr  IVF: none  Central access: PICC placed 1/3 TPN start date: 1/3  ASSESSMENT  HPI: 45 yoM presented to ED on 12/27 with abdominal pain.  PMH includes HTN, HLD, PVD - s/p fem bypass, CAD s/p NSTEMI 2014, cardiomyopathy, DM, diabetic peripheral neuropathy, seizures, and chronic pain on chronic opioids.  Surgical history significant for open AAA repair.  CT shows partial SBO, likely adhesions.  Patient refused surgery, until 1/6 s/p repair of perforated viscus.  Plan for conservative management with TPN, bowel rest.   Significant events:  1/1: TPN per pharmacy ordered - unable to start without central access.  1/3: PICC line placed; TPN started 1/5: Acute encephalopathy with fevers (102F) overnight- sepsis protocol initiated.  Broad-spectrum abx started.  1/6: Consented to laparotomy w/ findings of perforated viscus and peritonitis. Txfer to ICU   1/7:  AKI 1/10: NGT removed 1/11: started clear liquid diet 1/13 NPO again for CT guided drain placement for perihepatic fluid collection suspicious for abscess. FL diet post-procedure 1/16: per CCS, no bowel sounds noted today, patient needs to be walked as much as possible 1/18: started full liquid diet, tolerating small amounts. AKI resolved. 1/19: IR: RLQ and RUO drains removed, LUQ drain repositioned 1/22: advanced diet to regular on 1/21 1/23 drains continue to put out yellow purulent drainage 1/24 begin wean of TPN to encourage PO appetite  1/25: 48 hr calorie count ordered.  Plan is to re-CT once drain output 30-50 ml or less 1/27: mild hypoglycemic episode (CBG 62)-- decreased insulin in TPN bag from 15  units to 10 units 1/29: per Dr. Excell Seltzer, continue TPN for now until oral intake is significantly better 1/31 : Per Dr. Marlou Starks, passing some flatus and tolerating some food. Continue TPN. 2/2: TPN wean per surgery.    Today 12/28/2015:   Glucose (goal <150): 64-169. Hx DM on glipizide PTA.   Electrolytes - Na and Mg sl low - Mag replacement ordered by MD.  Other lytes including CorrCa WNL.    Renal -  SCr remains WNL  LFTs - all below ULN 1/30  TGs -  remain wnl 1/30  Prealbumin - consistently low, but trending up.  most recently 4.9 (1/30) Dietary intake, appetite was improving, but now complaining of feeling worse. Eating 25-100% of meals.     NUTRITIONAL GOALS                                                                                             RD recs: 1600-1800 Kcal/day, 65-80 g protein/day, > 2 L fluid/day  Clinimix E 5/15 at a goal rate of 65 ml/hr + 20% fat emulsion at 10 ml/hr provides: 78 g/day protein, 1588 Kcal/day.  PLAN  TPN already at half goal rate.  Stop TPN at 1800.  Consult IV Team to take down TPN.  Magnesium replacement per MD.   D/C TPN labs.    Ralene Bathe, PharmD, BCPS 12/28/2015, 1:39 PM  Pager: [phone]

## 2015-12-28 NOTE — Progress Notes (Addendum)
Patient ID: [name] [name], male   DOB: 1951-01-06, 65 y.o.   MRN: [id number] [name] PROGRESS NOTE  [name] [name] [id number] DOB: [birth year] DOA: 11/21/2015 PCP: Marijean Bravo, MD  Brief narrative:    65 year old very pleasant male with a past medical history of hyperlipidemia, hypertension, CAD, type 2 diabetes, diabetic peripheral neuropathy, seizure disorder, polysubstance abuse and chronic pain issues (on high doses of oxycodone prior to admission) who was admitted 11/22/15 with a small bowel obstruction. Surgical consultation was obtained on admission with conservative therapy initially recommended. Patient's bowel obstruction was felt to be from adhesions and possible narcotic bowel. His NG tube was removed 11/23/15 and he seemed to be improving clinically but pain returned 11/24/15 with diet advancement to clears. Repeat films showed worsening small bowel dilatation so his NG tube was reinserted. TPN was subsequently ordered. On 11/27/15, the patient refused PICC line insertion for TPN and surgical intervention. PICC finally placed 11/28/15 with initiation of TPN.   Further, patient has developed a right upper extremity DVT and he was on heparin but AC stopped due to bleed.   On 11/30/15, the patient's condition deteriorated acutely and he was found to be confused and restless. Rapid response was called. He was febrile and tachycardic with an elevated lactate level. Due to concerns for sepsis, broad-spectrum antibiotics were initiated with vancomycin and Primaxin which was switched to cefepime given his seizure history. Patient uderwent exploratory laparotomy on 12/01/15 with findings of perforated viscus with peritoneal contamination/peritonitis. He remained intubated postoperatively and was under the care of the critical care team. He self extubated 12/02/15. Surgery continues to follow, patient has an open abdominal wound. He continues to have an ileus and is on TNA at this time.  [name] assumed care 12/05/15.  Transferred to telemetry floor 12/08/2015.  Assessment/Plan:    Principal problem: Sepsis secondary to SBO w/ perforated viscous and resultant peritonitis / Postoperative ileus / leukocytosis  - S/p abdominal fluid collection drainages 1/13 with removal of RLQ drain 1/20, repositioning of LUQ drain 1/20 and placement of new right subphrenic drain 1/20 - S/P removal of RLQ drain 1/20, repositioning of LUQ drain 1/20 and placement of new right subphrenic drain 1/20 - CT scan repeated 12/25/2015 and per surgery drains should remain in place. - Patient has completed appropriate course of antibiotics. Please refer to anti-infectives section on antibiotics during this hospital stay.  Active problems:  Chest pain 1/20 secondary to demand ischemia - Demand ischemia from blood loss - Stable   Hypomagnesemia - Due to nutritional feeds, poor po intake  - Still low, 1.6. Continue to supplement.  Severe protein calorie malnutrition  - In the context of chronic illness, small bowel obstruction with peritonitis - Please note patient malnourished because of poor by mouth intake however based on BMI he's not under weight. His BMI is 20. - Plan to wean TPN today per surgery  Acute upper extremity DVT / Acute blood loss anemia  - US DVT right distal axillary vein, subclavian vein.  - Stopped heparin drip due to drop in Hg 1/23.  - Pt transfused two units PRBC 1/23, one unit 1/24 - IVC filters not placed in arm - Hemoglobin 12/26/2015 was 8. Repeat CBC tomorrow morning.  Post operative respiratory failure with hypoxemia - Patient required ventilatory support after exploratory laparotomy 12/01/2015.  - Patient self extubated 12/02/2015 - Patient has received Lasix 80 mg IV on 12/05/2015 but no Lasix since then.  Acute Encephalopathy in setting of  sepsis / history of seizure disorder - EEG done 11/30/2015 showed no acute seizures - If seizures occur,  use Keppra   Acute kidney injury - In the setting of sepsis and small bowel obstruction with perforated viscus - Creatinine normalized with fluids  Controlled, diabetes mellitus type 2 with peripheral circulatory complications without long-term insulin use - Continue sliding scale insulin  - CBGs in past 24 hours: 155, 150, 161  Left lower extremity wound / S/p left fem pop bypass w/ non-healing ulcer - Appreciate wound care assessment  DVT Prophylaxis  - SCD's bilaterally in hospital  Code Status: Full.  Family Communication:  Family not at the bedside  Disposition Plan: to SNF once abscesses improved.   IV access:  Peripheral IV  Procedures and diagnostic studies from 12/05/2015:  Ct Abdomen Pelvis W Contrast 12/25/2015  Stable bilateral pleural effusions are noted with adjacent subsegmental atelectasis, left greater than right. Right para Padda crescent shaped fluid collection is significantly smaller compared to prior exam, status post placement of new drainage catheter, tip of which is seen over dome of right hepatic lobe. No change seen in position of drainage catheter within left perisplenic fluid collection, which is unchanged in size compared to prior exam. There is been interval removal of drainage catheter from right lateral fluid collection noted on prior exam, with residual fluid collection measuring 4.6 x 2.8 cm which is not significantly changed compared to prior exam. Left lateral peritoneal fluid collection is noted measuring 7.6 x 1.5 cm which may be slightly smaller compared to prior exam. There are noted multiple smaller and probably loculated fluid collections seen posteriorly in the pelvis, with the largest single collection measuring 3.1 x 1.4 cm.   Ct Abdomen Pelvis Wo Contrast 12/07/2015 1. There is small bilateral pleural effusion right greater than left. Significant atelectasis or infiltrate in right lower lobe posteriorly. There is superimposed mild interstitial  prominence bilateral lower lobes probable mild interstitial edema. 2. There is moderate perihepatic and perisplenic ascites. Moderate ascites noted bilateral paracolic gutters. Please note there is some loculation of perihepatic ascites with mild mass effect on the liver contour please see axial images 32 and 15. Peritoneal inflammation or infection cannot be excluded. 3. No hydronephrosis or hydroureter. Stable bilateral renal cysts. 4. Postsurgical changes are noted anterior abdominal wall. Incomplete healed/open subcutaneous midline anterior abdominal wound. 5. Mild distended small bowel loops containing oral contrast material and some air-fluid levels. There is no transition point in caliber of small bowel. Findings most likely due to significant ileus or residual partial small bowel obstruction. There is some oral contrast material noted within distal colon. 6. Moderate pelvic ascites. Moderate gas noted within mid sigmoid colon probable mild ileus. 7. Significant anasarca infiltration of subcutaneous fat abdominal and pelvic wall. 8. 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 Electronically Signed By: Lahoma Crocker M.D. On: 12/07/2015 15:18   Dg Chest Port 1 View 12/06/2015 Stable bilateral pulmonary edema, with right basilar subsegmental atelectasis and associated pleural effusion. Interval placement of left internal jugular catheter with distal tip in SVC. No pneumothorax is noted. Electronically Signed By: Marijo Conception, M.D. On: 12/06/2015 16:10   Dg Chest Port 1 View 12/05/2015 Worsening of pulmonary interstitial infiltrates bilaterally. This may reflect pulmonary edema of cardiac or noncardiac cause or could reflect pneumonia. There are small bilateral pleural effusions.   Medical Consultants:  Surgery  CCM transfer care to [facility] 1-10 PCT 12/08/2015 IR  Other Consultants:  Nutrition PT  IAnti-Infectives:  Flagyl 11-30-2015 --> stopped Cefepime 12-02-2015 -->  12/10/2015    Leisa Lenz, MD  [name] Pager [phone]  Time spent in minutes: 15 minutes  If 7PM-7AM, please contact night-coverage [email or web address] Password [email or web address] 12/28/2015, 12:13 PM   LOS: 37 days    HPI/Subjective: No acute overnight events. No nausea or vomiting.  Objective: Filed Vitals:   12/27/15 1454 12/27/15 1537 12/27/15 2126 12/28/15 0526  BP: 106/59  133/82 122/83  Pulse: 92  87 96  Temp:   98.5 F (36.9 C) 99.1 F (37.3 C)  TempSrc:   Oral Oral  Resp: 99 14 15 16   Height:      Weight:      SpO2: 98%  97% 100%    Intake/Output Summary (Last 24 hours) at 12/28/15 1213 Last data filed at 12/28/15 0930  Gross per 24 hour  Intake     63 ml  Output   1965 ml  Net  -1902 ml    Exam:   General:  Pt is sleeping  Cardiovascular: Rate controlled, appreciate S1, S2   Respiratory: no wheezing, no rhonchi   Abdomen: JP drains in place, nontender abdomen  Extremities: No swelling, pulses palpable bilaterally  Neuro: No focal deficits  Data Reviewed: Basic Metabolic Panel:  Recent Labs Lab 12/22/15 0553 12/23/15 0320 12/24/15 0516 12/25/15 0450 12/27/15 0415 12/27/15 0940 12/28/15 0620  NA 132* 133*  --  135  --   --  131*  K 4.3 4.5  --  5.1  --  4.8 4.6  CL 93* 93*  --  95*  --   --  92*  CO2 33* 34*  --  32  --   --  32  GLUCOSE 72 167*  --  157*  --   --  169*  BUN 17 18  --  20  --   --  18  CREATININE 0.69 0.71  --  0.73  --   --  0.65  CALCIUM 8.0* 8.0*  --  8.9  --   --  9.1  MG 1.7 1.8 1.5* 1.6* 1.7  --  1.6*  PHOS  --  3.3  --  3.3  --   --  3.4   Liver Function Tests:  Recent Labs Lab 12/25/15 0450 12/28/15 0620  AST 21 26  ALT 12* 17  ALKPHOS 118 144*  BILITOT 0.3 0.4  PROT 7.1 7.3  ALBUMIN 1.9* 2.0*   No results for input(s): LIPASE, AMYLASE in the last 168 hours. No results for input(s): AMMONIA in the last 168 hours. CBC:  Recent Labs Lab 12/22/15 0553 12/23/15 0320 12/24/15 0516 12/25/15 0450  12/26/15 0415  WBC 12.7* 11.6* 11.6* 14.1* 12.5*  NEUTROABS  --   --   --  11.3*  --   HGB 7.8* 8.0* 8.4* 7.8* 8.0*  HCT 24.7* 25.5* 26.9* 25.4* 25.8*  MCV 95.7 97.0 96.8 97.3 97.4  PLT 263 PLATELET CLUMPS NOTED ON SMEAR, COUNT APPEARS ADEQUATE 236 459* 226   Cardiac Enzymes: No results for input(s): CKTOTAL, CKMB, CKMBINDEX, TROPONINI in the last 168 hours. BNP: Invalid input(s): POCBNP CBG:  Recent Labs Lab 12/27/15 1657 12/27/15 2131 12/28/15 0026 12/28/15 0735 12/28/15 1142  GLUCAP 129* 64* 155* 150* 161*    Recent Results (from the past 240 hour(s))  Culture, routine-abscess     Status: None (Preliminary result)   Collection Time: 12/27/15  3:00 PM  Result Value Ref Range Status   Specimen Description ABDOMEN LEFT  Final  Special Requests NONE  Final   Gram Stain   Final    ABUNDANT WBC PRESENT,BOTH PMN AND MONONUCLEAR NO SQUAMOUS EPITHELIAL CELLS SEEN NO ORGANISMS SEEN Performed at [facility]    Culture NO GROWTH Performed at [facility]   Final   Report Status PENDING  Incomplete  Culture, routine-abscess     Status: None (Preliminary result)   Collection Time: 12/27/15  3:01 PM  Result Value Ref Range Status   Specimen Description ABDOMEN RIGHT  Final   Special Requests NONE  Final   Gram Stain   Final    MODERATE WBC PRESENT,BOTH PMN AND MONONUCLEAR NO SQUAMOUS EPITHELIAL CELLS SEEN NO ORGANISMS SEEN Performed at [facility]    Culture NO GROWTH Performed at [facility]   Final   Report Status PENDING  Incomplete     Scheduled Meds: . antiseptic oral rinse  7 mL Mouth Rinse q12n4p  . bacitracin   Topical Daily  . chlorhexidine  15 mL Mouth Rinse BID  . collagenase  1 application Topical Daily  . feeding supplement (ENSURE ENLIVE)  237 mL Oral TID BM  . fentaNYL (SUBLIMAZE) injection  50 mcg Intravenous Once  . insulin aspart  0-15 Units Subcutaneous TID WC  . insulin aspart  0-5 Units Subcutaneous QHS  .  lip balm  1 application Topical BID  . magnesium oxide  400 mg Oral BID  . methocarbamol  1,000 mg Oral TID  . sodium chloride  3 mL Intravenous Q12H  . thiamine  100 mg Oral Daily   Continuous Infusions: . Marland KitchenTPN (CLINIMIX-E) Adult 35 mL/hr at 12/27/15 1722   And  . fat emulsion 120 mL (12/27/15 1723)

## 2015-12-29 DIAGNOSIS — I248 Other forms of acute ischemic heart disease: Secondary | ICD-10-CM

## 2015-12-29 LAB — CBC
HEMATOCRIT: 24.7 % — AB (ref 39.0–52.0)
Hemoglobin: 7.6 g/dL — ABNORMAL LOW (ref 13.0–17.0)
MCH: 29.5 pg (ref 26.0–34.0)
MCHC: 30.8 g/dL (ref 30.0–36.0)
MCV: 95.7 fL (ref 78.0–100.0)
Platelets: 315 10*3/uL (ref 150–400)
RBC: 2.58 MIL/uL — AB (ref 4.22–5.81)
RDW: 16.1 % — AB (ref 11.5–15.5)
WBC: 12 10*3/uL — AB (ref 4.0–10.5)

## 2015-12-29 LAB — CULTURE, ROUTINE-ABSCESS
CULTURE: NO GROWTH
Culture: NO GROWTH

## 2015-12-29 LAB — GLUCOSE, CAPILLARY
GLUCOSE-CAPILLARY: 110 mg/dL — AB (ref 65–99)
Glucose-Capillary: 148 mg/dL — ABNORMAL HIGH (ref 65–99)
Glucose-Capillary: 123 mg/dL — ABNORMAL HIGH (ref 65–99)
Glucose-Capillary: 135 mg/dL — ABNORMAL HIGH (ref 65–99)

## 2015-12-29 LAB — BASIC METABOLIC PANEL
ANION GAP: 5 (ref 5–15)
BUN: 17 mg/dL (ref 6–20)
CO2: 33 mmol/L — AB (ref 22–32)
Calcium: 8.6 mg/dL — ABNORMAL LOW (ref 8.9–10.3)
Chloride: 92 mmol/L — ABNORMAL LOW (ref 101–111)
Creatinine, Ser: 0.63 mg/dL (ref 0.61–1.24)
GFR calc Af Amer: >60 mL/min (ref >60)
GFR calc non Af Amer: >60 mL/min (ref >60)
GLUCOSE: 163 mg/dL — AB (ref 65–99)
POTASSIUM: 4.8 mmol/L (ref 3.5–5.1)
Sodium: 130 mmol/L — ABNORMAL LOW (ref 135–145)

## 2015-12-29 LAB — MAGNESIUM: Magnesium: 1.7 mg/dL (ref 1.7–2.4)

## 2015-12-29 MED ORDER — OXYCODONE HCL 20 MG PO TABS: 20.0000 mg | ORAL_TABLET | ORAL | Status: DC | PRN

## 2015-12-29 MED ORDER — ENSURE ENLIVE PO LIQD: 237.0000 mL | Freq: Three times a day (TID) | ORAL | Status: DC

## 2015-12-29 MED ORDER — DIPHENHYDRAMINE HCL 25 MG PO CAPS: 25.0000 mg | ORAL_CAPSULE | Freq: Four times a day (QID) | ORAL | Status: DC | PRN

## 2015-12-29 MED ORDER — BISACODYL 10 MG RE SUPP: 10.0000 mg | Freq: Two times a day (BID) | RECTAL | Status: DC | PRN

## 2015-12-29 MED ORDER — ACETAMINOPHEN 325 MG PO TABS: 650.0000 mg | ORAL_TABLET | Freq: Four times a day (QID) | ORAL | Status: DC | PRN

## 2015-12-29 MED ORDER — ALUM & MAG HYDROXIDE-SIMETH 200-200-20 MG/5ML PO SUSP: 30.0000 mL | Freq: Four times a day (QID) | ORAL | Status: DC | PRN

## 2015-12-29 MED ORDER — THIAMINE HCL 100 MG PO TABS: 100.0000 mg | ORAL_TABLET | Freq: Every day | ORAL | Status: DC

## 2015-12-29 MED ORDER — BACITRACIN ZINC 500 UNIT/GM EX OINT: TOPICAL_OINTMENT | Freq: Every day | CUTANEOUS | Status: DC

## 2015-12-29 MED ORDER — CETYLPYRIDINIUM CHLORIDE 0.05 % MT LIQD: 7.0000 mL | Freq: Two times a day (BID) | OROMUCOSAL | Status: DC

## 2015-12-29 MED ORDER — INSULIN ASPART 100 UNIT/ML ~~LOC~~ SOLN: 0.0000 [IU] | Freq: Three times a day (TID) | SUBCUTANEOUS | Status: DC

## 2015-12-29 MED ORDER — METHOCARBAMOL 500 MG PO TABS: 1000.0000 mg | ORAL_TABLET | Freq: Three times a day (TID) | ORAL | Status: DC

## 2015-12-29 MED ORDER — MAGNESIUM OXIDE 400 (241.3 MG) MG PO TABS: 400.0000 mg | ORAL_TABLET | Freq: Two times a day (BID) | ORAL | Status: DC

## 2015-12-29 NOTE — Progress Notes (Signed)
28 Days Post-Op  Subjective: He says he is feeling a little better today  Objective: Vital signs in last 24 hours: Temp:  [98 F (36.7 C)-98.6 F (37 C)] 98.6 F (37 C) (02/03 0521) Pulse Rate:  [89-93] 89 (02/03 0521) Resp:  [16-18] 18 (02/03 0521) BP: (119-124)/(78-86) 119/78 mmHg (02/03 0521) SpO2:  [95 %-100 %] 98 % (02/03 0521) Last BM Date: 12/28/15  Intake/Output from previous day: 02/02 0701 - 02/03 0700 In: 163 [P.O.:100; I.V.:3] Out: 585 [Urine:450; Drains:135] Intake/Output this shift:    Resp: clear to auscultation bilaterally Cardio: regular rate and rhythm GI: soft, mild tenderness. drains in place  Lab Results:   Recent Labs  12/29/15 0502  WBC 12.0*  HGB 7.6*  HCT 24.7*  PLT 315   BMET  Recent Labs  12/28/15 0620 12/29/15 0502  NA 131* 130*  K 4.6 4.8  CL 92* 92*  CO2 32 33*  GLUCOSE 169* 163*  BUN 18 17  CREATININE 0.65 0.63  CALCIUM 9.1 8.6*   PT/INR  Recent Labs  12/27/15 0415  LABPROT 16.1*  INR 1.28   ABG No results for input(s): PHART, HCO3 in the last 72 hours.  Invalid input(s): PCO2, PO2  Studies/Results: Ct Image Guided Drainage Percut Cath  Peritoneal Retroperit  12/27/2015  INDICATION: Abdominal abscess on the right and left flanks EXAM: CT GUIDED DRAINAGE OF BILATERAL ABSCESS MEDICATIONS: The patient is currently admitted to the hospital and receiving intravenous antibiotics. The antibiotics were administered within an appropriate time frame prior to the initiation of the procedure. ANESTHESIA/SEDATION: 0.5 mg IV Versed 25 mcg IV Fentanyl Moderate Sedation Time:  13 The patient was continuously monitored during the procedure by the interventional radiology nurse under my direct supervision. COMPLICATIONS: None immediate. TECHNIQUE: Informed written consent was obtained from the patient after a thorough discussion of the procedural risks, benefits and alternatives. All questions were addressed. Maximal Sterile Barrier  Technique was utilized including caps, mask, sterile gowns, sterile gloves, sterile drape, hand hygiene and skin antiseptic. A timeout was performed prior to the initiation of the procedure. PROCEDURE: The abdominal region was prepped with ChloraPrep in a sterile fashion, and a sterile drape was applied covering the operative field. A sterile gown and sterile gloves were used for the procedure. Local anesthesia was provided with 1% Lidocaine. Under CT guidance, a Yueh Angiocath was inserted into the left flank abscess. 5 cc of pus was aspirated. Under CT guidance, a Teressa Lower Angiocath was placed in the right flank abscess and an additional 5 cc of pus was aspirated. Samples were sent for culture. FINDINGS: Images document you we Angiocath placement in the right and left flank abscesses. Post aspiration imaging demonstrates no hemorrhage and some improvement in the size of the abscesses. IMPRESSION: Successful right and left pelvic abscess drainage utilizing UE Angiocath. Electronically Signed   By: Marybelle Killings M.D.   On: 12/27/2015 16:13   Ct Image Guided Drainage Percut Cath  Peritoneal Retroperit  12/27/2015  INDICATION: Abdominal abscess on the right and left flanks EXAM: CT GUIDED DRAINAGE OF BILATERAL ABSCESS MEDICATIONS: The patient is currently admitted to the hospital and receiving intravenous antibiotics. The antibiotics were administered within an appropriate time frame prior to the initiation of the procedure. ANESTHESIA/SEDATION: 0.5 mg IV Versed 25 mcg IV Fentanyl Moderate Sedation Time:  13 The patient was continuously monitored during the procedure by the interventional radiology nurse under my direct supervision. COMPLICATIONS: None immediate. TECHNIQUE: Informed written consent was obtained from the patient  after a thorough discussion of the procedural risks, benefits and alternatives. All questions were addressed. Maximal Sterile Barrier Technique was utilized including caps, mask, sterile gowns,  sterile gloves, sterile drape, hand hygiene and skin antiseptic. A timeout was performed prior to the initiation of the procedure. PROCEDURE: The abdominal region was prepped with ChloraPrep in a sterile fashion, and a sterile drape was applied covering the operative field. A sterile gown and sterile gloves were used for the procedure. Local anesthesia was provided with 1% Lidocaine. Under CT guidance, a Yueh Angiocath was inserted into the left flank abscess. 5 cc of pus was aspirated. Under CT guidance, a Teressa Lower Angiocath was placed in the right flank abscess and an additional 5 cc of pus was aspirated. Samples were sent for culture. FINDINGS: Images document you we Angiocath placement in the right and left flank abscesses. Post aspiration imaging demonstrates no hemorrhage and some improvement in the size of the abscesses. IMPRESSION: Successful right and left pelvic abscess drainage utilizing UE Angiocath. Electronically Signed   By: Marybelle Killings M.D.   On: 12/27/2015 16:13    Anti-infectives: Anti-infectives    Start     Dose/Rate Route Frequency Ordered Stop   12/27/15 1415  vancomycin (VANCOCIN) IVPB 1000 mg/200 mL premix     1,000 mg 200 mL/hr over 60 Minutes Intravenous  Once 12/27/15 1412 12/27/15 1523   12/15/15 0800  metroNIDAZOLE (FLAGYL) tablet 500 mg  Status:  Discontinued     500 mg Oral Every 6 hours 12/15/15 0714 12/20/15 1055   12/08/15 0800  metroNIDAZOLE (FLAGYL) IVPB 500 mg  Status:  Discontinued     500 mg 100 mL/hr over 60 Minutes Intravenous Every 6 hours 12/08/15 0737 12/15/15 0714   12/03/15 1800  vancomycin (VANCOCIN) IVPB 750 mg/150 ml premix  Status:  Discontinued     750 mg 150 mL/hr over 60 Minutes Intravenous Every 24 hours 12/03/15 1715 12/04/15 0911   12/02/15 1800  ceFEPIme (MAXIPIME) 1 g in dextrose 5 % 50 mL IVPB     1 g 100 mL/hr over 30 Minutes Intravenous Every 12 hours 12/02/15 1014 12/09/15 1759   12/02/15 0600  vancomycin (VANCOCIN) IVPB 1000 mg/200 mL  premix  Status:  Discontinued     1,000 mg 200 mL/hr over 60 Minutes Intravenous 3 times per day 12/02/15 0547 12/02/15 0954   12/01/15 2200  ceFEPIme (MAXIPIME) 1 g in dextrose 5 % 50 mL IVPB  Status:  Discontinued     1 g 100 mL/hr over 30 Minutes Intravenous 3 times per day 12/01/15 1455 12/02/15 1014   11/30/15 1400  ceFEPIme (MAXIPIME) 1 g in dextrose 5 % 50 mL IVPB  Status:  Discontinued     1 g 100 mL/hr over 30 Minutes Intravenous 3 times per day 11/30/15 0913 12/01/15 1455   11/30/15 1000  metroNIDAZOLE (FLAGYL) IVPB 500 mg     500 mg 100 mL/hr over 60 Minutes Intravenous Every 8 hours 11/30/15 0858 12/07/15 0226   11/30/15 0645  vancomycin (VANCOCIN) IVPB 750 mg/150 ml premix  Status:  Discontinued     750 mg 150 mL/hr over 60 Minutes Intravenous 3 times per day 11/30/15 0630 12/02/15 0547   11/30/15 0645  imipenem-cilastatin (PRIMAXIN) 500 mg in sodium chloride 0.9 % 100 mL IVPB  Status:  Discontinued     500 mg 200 mL/hr over 30 Minutes Intravenous 3 times per day 11/30/15 0630 11/30/15 0859      Assessment/Plan: s/p Procedure(s): EXPLORATORY LAPAROTOMY WITH  RADICAL PERITONEAL DEBRIDEMENT, CLOSURE OF SMALL BOWEL PERFORATION AND LYSIS OF ADHESIONS. (N/A) Advance diet. Encourage po's Wean tpn PT  LOS: 38 days    TOTH III,PAUL S 12/29/2015

## 2015-12-29 NOTE — NC FL2 (Signed)
[place] LEVEL OF CARE SCREENING TOOL     IDENTIFICATION  Patient Name: [name] [name] Birthdate: 06-19-1951 Sex: male Admission Date (Current Location): 11/21/2015  [facility], [place] and [place] Number:  Herbalist and Address:  [facility],  [place] [address], [place]      Provider Number: [id number]  Attending Physician Name and Address:  Robbie Lis, MD  Relative Name and Phone Number:       Current Level of Care: Hospital Recommended Level of Care: [place] Prior Approval Number:    Date Approved/Denied: 12/09/15 PASRR Number: [id number]  Discharge Plan: SNF    Current Diagnoses: Patient Active Problem List   Diagnosis Date Noted  . Hypomagnesemia 12/16/2015  . Demand ischemia on 1/20 from acute blood loss anemia ([place]) 12/16/2015  . Abdominal wall fluid collections   . Acute encephalopathy 12/12/2015  . Seizures ([place]) 12/12/2015  . Acute kidney injury ([place]) 12/12/2015  . Controlled diabetes mellitus with circulatory complication, without long-term current use of insulin ([place]) 12/12/2015  . Anemia of chronic disease 12/12/2015  . Sepsis ([place]) 12/12/2015  . Leukocytosis 12/12/2015  . Thrombocytopenia ([place]) 12/12/2015  . Palliative care encounter   . Acute respiratory failure with hypoxemia ([place]) 12/06/2015  . DVT of axillary vein, acute right   . Small bowel obstruction & perforation s/p OR exploration & repair 12/01/2015 12/02/2015  . Purulent peritonitis ([place]) 12/02/2015  . Protein-calorie malnutrition, severe ([place]) 07/30/2015    Orientation RESPIRATION BLADDER Height & Weight     Self, Time, Situation, Place  O2 (2L) Continent Weight: 157 lb 10.1 oz (71.5 kg) Height:  6\' 2"  (188 cm)  BEHAVIORAL SYMPTOMS/MOOD NEUROLOGICAL BOWEL NUTRITION STATUS      Continent Diet (regular)  AMBULATORY STATUS COMMUNICATION OF NEEDS Skin   Extensive Assist Verbally   (PressureUlcer01/18/17StageII-Partialthicknesslossofdermispresentingasashallowopenulcerwithared,pinkwoundbedwithoutslough.  Wound/Incision(OpenorDehisced)12/27/16DiabeticulcerFootLeft;Anterior  Incision(Closed)12/01/15)abdomen                Personal Care Assistance Level of Assistance  Bathing, Feeding, Dressing Bathing Assistance: Maximum assistance Feeding assistance: Limited assistance Dressing Assistance: Maximum assistance     Functional Limitations Info             SPECIAL CARE FACTORS FREQUENCY  PT (By licensed PT), OT (By licensed OT)     PT Frequency: 5 X weekly OT Frequency: 5 X weekly            Contractures Contractures Info: Not present    Additional Factors Info  Insulin Sliding Scale, Allergies, Code Status Code Status Info: Full Allergies Info: Allergies:  Zetia, Atorvastatin, Penicillins, Pravastatin Sodium, Lisinopril, Promethazine   Insulin Sliding Scale Info: 0-15 Units, 6 X per day       Current Medications (12/29/2015):  This is the current hospital active medication list Current Facility-Administered Medications  Medication Dose Route Frequency Provider Last Rate Last Dose  . acetaminophen (TYLENOL) tablet 650 mg  650 mg Oral Q6H PRN Ritta Slot, NP   650 mg at 12/24/15 0341  . albuterol (PROVENTIL) (2.5 MG/3ML) 0.083% nebulizer solution 2.5 mg  2.5 mg Inhalation Q6H PRN Florencia Reasons, MD   2.5 mg at 12/22/15 0941  . alum & mag hydroxide-simeth (MAALOX/MYLANTA) 200-200-20 MG/5ML suspension 30 mL  30 mL Oral Q6H PRN Michael Boston, MD   30 mL at 12/13/15 0312  . antiseptic oral rinse (CPC / CETYLPYRIDINIUM CHLORIDE 0.05%) solution 7 mL  7 mL Mouth Rinse q12n4p Kara Mead V, MD   7 mL at 12/24/15  1301  . bacitracin ointment   Topical Daily Florencia Reasons, MD      . bisacodyl (DULCOLAX) suppository 10 mg  10 mg Rectal Q12H PRN Michael Boston, MD   10 mg at 12/28/15 0328  . chlorhexidine (PERIDEX) 0.12 % solution 15 mL  15 mL  Mouth Rinse BID Kara Mead V, MD   15 mL at 12/29/15 0934  . collagenase (SANTYL) ointment 1 application  1 application Topical Daily Florencia Reasons, MD   1 application at [id number]  . diphenhydrAMINE (BENADRYL) capsule 25 mg  25 mg Oral Q6H PRN Robbie Lis, MD   25 mg at 12/27/15 0243  . feeding supplement (ENSURE ENLIVE) (ENSURE ENLIVE) liquid 237 mL  237 mL Oral TID BM Nat Christen, PA-C   237 mL at 12/29/15 0954  . fentaNYL (SUBLIMAZE) injection 50 mcg  50 mcg Intravenous Once Jacqulynn Cadet, MD   50 mcg at 12/14/15 1805  . hydrALAZINE (APRESOLINE) injection 10-20 mg  10-20 mg Intravenous Q6H PRN Donita Brooks, NP   20 mg at 12/04/15 1823  . HYDROmorphone (DILAUDID) injection 1-2 mg  1-2 mg Intravenous Q4H PRN Robbie Lis, MD   2 mg at 12/29/15 0513  . insulin aspart (novoLOG) injection 0-15 Units  0-15 Units Subcutaneous TID WC Randall K Absher, RPH   2 Units at 12/29/15 0757  . insulin aspart (novoLOG) injection 0-5 Units  0-5 Units Subcutaneous QHS Julieta Bellini Absher, RPH   2 Units at 12/24/15 2242  . lip balm (CARMEX) ointment 1 application  1 application Topical BID Michael Boston, MD   1 application at [id number]  . magic mouthwash  15 mL Oral QID PRN Michael Boston, MD   15 mL at 12/25/15 2224  . magnesium oxide (MAG-OX) tablet 400 mg  400 mg Oral BID Thomes Lolling, RPH   400 mg at 12/29/15 [id number]  . menthol-cetylpyridinium (CEPACOL) lozenge 3 mg  1 lozenge Oral PRN Michael Boston, MD      . methocarbamol (ROBAXIN) tablet 1,000 mg  1,000 mg Oral TID Nat Christen, PA-C   1,000 mg at 12/29/15 0934  . ondansetron (ZOFRAN) injection 4 mg  4 mg Intravenous Q6H PRN Dianne Dun, NP   4 mg at 12/29/15 0146  . oxyCODONE (Oxy IR/ROXICODONE) immediate release tablet 20 mg  20 mg Oral Q4H PRN Nat Christen, PA-C   20 mg at 12/27/15 1136  . phenol (CHLORASEPTIC) mouth spray 2 spray  2 spray Mouth/Throat PRN Michael Boston, MD      . sodium chloride (OCEAN) 0.65 % nasal spray 1 spray  1  spray Each Nare PRN Robbie Lis, MD   1 spray at 12/20/15 2047  . sodium chloride 0.9 % injection 10-40 mL  10-40 mL Intracatheter PRN Florencia Reasons, MD   10 mL at 12/27/15 0415  . sodium chloride 0.9 % injection 10-40 mL  10-40 mL Intracatheter PRN Robbie Lis, MD   20 mL at 12/29/15 0504  . sodium chloride 0.9 % injection 3 mL  3 mL Intravenous Q12H Reubin Milan, MD   3 mL at 12/28/15 2359  . thiamine (VITAMIN B-1) tablet 100 mg  100 mg Oral Daily Michael Boston, MD   100 mg at 12/29/15 0934  . triamcinolone cream (KENALOG) 0.1 % 1 application  1 application Topical BID PRN Florencia Reasons, MD         Discharge Medications: Please see discharge summary for a list  of discharge medications.  Relevant Imaging Results:  Relevant Lab Results:   Additional Information SSN: [id number]  Standley Brooking, LCSW

## 2015-12-29 NOTE — Discharge Instructions (Signed)

## 2015-12-29 NOTE — Progress Notes (Signed)
Nutrition Follow-up  DOCUMENTATION CODES:   Not applicable  INTERVENTION:  -Continue Ensure Enlive TID - Encourage PO intakes of meals and supplements - RD will continue to monitor for needs  NUTRITION DIAGNOSIS:   Inadequate oral intake related to poor appetite as evidenced by per patient/family report. -updated, ongoing  GOAL:   Patient will meet greater than or equal to 90% of their needs -unmet with d/c of TPN  MONITOR:   PO intake, Weight trends, Labs, Skin, I & O's  ASSESSMENT:   65 year old male with a past medical history of hyperlipidemia, hypertension, CAD, type 2 diabetes, diabetic peripheral neuropathy, seizure disorder, polysubstance abuse and chronic pain issues (on high doses of oxycodone prior to admission) who was admitted 11/22/15 with a small bowel obstruction. Surgical consultation was obtained on admission with conservative therapy initially recommended. Patient's bowel obstruction was felt to be from adhesions and possible narcotic bowel. His NG tube was removed 11/23/15 and he seemed to be improving clinically but pain returned 11/24/15 with diet advancement to clears. Repeat films showed worsening small bowel dilatation so his NG tube was reinserted. TPN was subsequently ordered. On 11/27/15, the patient refused PICC line insertion for TPN and surgical intervention. PICC finally placed 11/28/15 with initiation of TPN.   2/3 Diet upgraded from CLD to Regular yesterday (2/2) @ 1143. Per chart, pt ate 25% of lunch yesterday following upgrade. Pt states for breakfast this AM he ate a bowl of rice crispies and a few sips of water. He states he has not been feeling hungry and that he is "taking it slow, it is going to take time" concerning appetite improving.   TPN was d/c'ed yesterday at 1800. Encouraged pt to continuing eating at meals and drinking Ensures TID. He states he drank 100% of Ensure last night but has not had once yet today. Pt not meeting needs with d/c of  TPN. No new weight since 12/08/15. Medications reviewed. Labs reviewed; CBGs: 64-161 mg/dL, Na: 130 mmol/L, Cl: 92 mmol/L, Ca: 8.6 mg/dL.   1/31 - Pt continue to receiving Clinimix E 5/15 @ 35 mL/hr with 20% lipids @ 5 mL/hr which is providing 836 kcal, 42 grams of protein. - Pt previously on Regular diet and per chart review, he ate 100% of breakfast and lunch on this diet 1/29.  - Pt downgraded to CLD yesterday PM for repeat CT scan and consumed 25% of breakfast this AM.  - Lunch tray at bedside at time of visit and is untouched.  - Pt reports that since 1/29 appetite has been poor within increasing/ongoing abdominal pain.  - He states this pain is worse with PO intakes.  - He also states that he was able to drink an Ensure earlier today.   *Please see chart for RD notes from earlier in admission.  Diet Order:  Diet regular Room service appropriate?: Yes; Fluid consistency:: Thin  Skin:  Wound (see comment) (L foot and toe DM ulcers)  Last BM:  2/2  Height:   Ht Readings from Last 1 Encounters:  12/01/15 6\' 2"  (1.88 m)    Weight:   Wt Readings from Last 1 Encounters:  12/08/15 157 lb 10.1 oz (71.5 kg)    Ideal Body Weight:  86.36 kg (kg)  BMI:  Body mass index is 20.23 kg/(m^2).  Estimated Nutritional Needs:   Kcal:  1600-1800  Protein:  65-80 grams  Fluid:  >/= 2 L/day  EDUCATION NEEDS:   No education needs identified at this time  Jarome Matin, RD, LDN Inpatient Clinical Dietitian Pager # [phone] After hours/weekend pager # [phone]

## 2015-12-29 NOTE — Discharge Summary (Addendum)
Physician Discharge Summary  [name] [name] [id number] DOB: November 03, 1951 DOA: 11/21/2015  PCP: Marijean Bravo, MD  Admit date: 11/21/2015 Discharge date: 12/30/2015  Recommendations for Outpatient Follow-up:  1. Please have follow up CT scan in about 2 weeks to reassess if drains can be removed. Antibiotics' course completed in hospital.  2. Please increase pain meds depending on how patient's pain is controlled at the facility. If additional medications are needed please consider adding that in SNF.  Discharge Diagnoses:  Principal Problem:   Small bowel obstruction & perforation s/p OR exploration & repair 12/01/2015 Active Problems:   Purulent peritonitis (HCC)   Sepsis (HCC)   Leukocytosis   Protein-calorie malnutrition, severe (HCC)   Acute respiratory failure with hypoxemia (HCC)   DVT of axillary vein, acute right   Palliative care encounter   Acute encephalopathy   Seizures (HCC)   Acute kidney injury ([place])   Controlled diabetes mellitus with circulatory complication, without long-term current use of insulin (HCC)   Anemia of chronic disease   Thrombocytopenia (HCC)   Abdominal wall fluid collections   Hypomagnesemia   Demand ischemia on 1/20 from acute blood loss anemia (HCC)    Discharge Condition: stable   Diet recommendation: as tolerated   History of present illness:  65 year old very pleasant male with a past medical history of hyperlipidemia, hypertension, CAD, type 2 diabetes, diabetic peripheral neuropathy, seizure disorder, polysubstance abuse and chronic pain issues (on high doses of oxycodone prior to admission) who was admitted 11/22/15 with a small bowel obstruction. Surgical consultation was obtained on admission with conservative therapy initially recommended. Patient's bowel obstruction was felt to be from adhesions and possible narcotic bowel. His NG tube was removed 11/23/15 and he seemed to be improving clinically but pain returned 11/24/15 with  diet advancement to clears. Repeat films showed worsening small bowel dilatation so his NG tube was reinserted. TPN was subsequently ordered. On 11/27/15, the patient refused PICC line insertion for TPN and surgical intervention. PICC finally placed 11/28/15 with initiation of TPN.   Further, patient has developed a right upper extremity DVT and he was on heparin but AC stopped due to bleed.   On 11/30/15, the patient's condition deteriorated acutely and he was found to be confused and restless. Rapid response was called. He was febrile and tachycardic with an elevated lactate level. Due to concerns for sepsis, broad-spectrum antibiotics were initiated with vancomycin and Primaxin which was switched to cefepime given his seizure history. Patient uderwent exploratory laparotomy on 12/01/15 with findings of perforated viscus with peritoneal contamination/peritonitis. He remained intubated postoperatively and was under the care of the critical care team. He self extubated 12/02/15. Surgery continues to follow, patient has an open abdominal wound. He continues to have an ileus and is on TNA at this time. [name] assumed care 12/05/15.  Transferred to telemetry floor 12/08/2015.  Hospital Course:   Assessment/Plan:    Principal problem: Sepsis secondary to SBO w/ perforated viscous and resultant peritonitis / Postoperative ileus / leukocytosis  - S/p abdominal fluid collection drainages 1/13 with removal of RLQ drain 1/20, repositioning of LUQ drain 1/20 and placement of new right subphrenic drain 1/20 - S/P removal of RLQ drain 1/20, repositioning of LUQ drain 1/20 and placement of new right subphrenic drain 1/20 - CT scan repeated 12/25/2015 and per surgery drains should remain in place. - Patient has completed appropriate course of antibiotics. Please refer to anti-infectives section on antibiotics during this hospital stay.  Active problems:  Chest pain 1/20 secondary to demand ischemia -  Demand ischemia from blood loss - Stable   Hypomagnesemia - Due to nutritional feeds, poor po intake  - Continue to supplement   Severe protein calorie malnutrition  - In the context of chronic illness, small bowel obstruction with peritonitis - Please note patient malnourished because of poor by mouth intake however based on BMI he's not under weight. His BMI is 20. - Diet as tolerated   Acute upper extremity DVT / Acute blood loss anemia  - US DVT right distal axillary vein, subclavian vein.  - Stopped heparin drip due to drop in Hg 1/23.  - Pt transfused two units PRBC 1/23, one unit 1/24 - IVC filters not placed in arm - Hemoglobin 12/26/2015 was 8. Repeat CBC tomorrow morning.  Post operative respiratory failure with hypoxemia - Patient required ventilatory support after exploratory laparotomy 12/01/2015.  - Patient self extubated 12/02/2015 - Patient has received Lasix 80 mg IV on 12/05/2015 but no Lasix since then.  Acute Encephalopathy in setting of sepsis / history of seizure disorder - EEG done 11/30/2015 showed no acute seizures - If seizures occur, use Keppra   Acute kidney injury - In the setting of sepsis and small bowel obstruction with perforated viscus - Creatinine normalized with fluids  Controlled, diabetes mellitus type 2 with peripheral circulatory complications without long-term insulin use - Continue sliding scale insulin   Left lower extremity wound / S/p left fem pop bypass w/ non-healing ulcer - Appreciate wound care assessment  DVT Prophylaxis  - SCD's bilaterally  Code Status: Full.  Family Communication: Family not at the bedside    IV access:  Peripheral IV  Procedures and diagnostic studies from 12/05/2015:  Ct Abdomen Pelvis W Contrast 12/25/2015 Stable bilateral pleural effusions are noted with adjacent subsegmental atelectasis, left greater than right. Right para Padda crescent shaped fluid collection is significantly  smaller compared to prior exam, status post placement of new drainage catheter, tip of which is seen over dome of right hepatic lobe. No change seen in position of drainage catheter within left perisplenic fluid collection, which is unchanged in size compared to prior exam. There is been interval removal of drainage catheter from right lateral fluid collection noted on prior exam, with residual fluid collection measuring 4.6 x 2.8 cm which is not significantly changed compared to prior exam. Left lateral peritoneal fluid collection is noted measuring 7.6 x 1.5 cm which may be slightly smaller compared to prior exam. There are noted multiple smaller and probably loculated fluid collections seen posteriorly in the pelvis, with the largest single collection measuring 3.1 x 1.4 cm.   Ct Abdomen Pelvis Wo Contrast 12/07/2015 1. There is small bilateral pleural effusion right greater than left. Significant atelectasis or infiltrate in right lower lobe posteriorly. There is superimposed mild interstitial prominence bilateral lower lobes probable mild interstitial edema. 2. There is moderate perihepatic and perisplenic ascites. Moderate ascites noted bilateral paracolic gutters. Please note there is some loculation of perihepatic ascites with mild mass effect on the liver contour please see axial images 32 and 15. Peritoneal inflammation or infection cannot be excluded. 3. No hydronephrosis or hydroureter. Stable bilateral renal cysts. 4. Postsurgical changes are noted anterior abdominal wall. Incomplete healed/open subcutaneous midline anterior abdominal wound. 5. Mild distended small bowel loops containing oral contrast material and some air-fluid levels. There is no transition point in caliber of small bowel. Findings most likely due to significant ileus or residual partial small bowel obstruction.  There is some oral contrast material noted within distal colon. 6. Moderate pelvic ascites. Moderate gas noted within  mid sigmoid colon probable mild ileus. 7. Significant anasarca infiltration of subcutaneous fat abdominal and pelvic wall. 8. 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 Electronically Signed By: Lahoma Crocker M.D. On: 12/07/2015 15:18   Dg Chest Port 1 View 12/06/2015 Stable bilateral pulmonary edema, with right basilar subsegmental atelectasis and associated pleural effusion. Interval placement of left internal jugular catheter with distal tip in SVC. No pneumothorax is noted. Electronically Signed By: Marijo Conception, M.D. On: 12/06/2015 16:10   Dg Chest Port 1 View 12/05/2015 Worsening of pulmonary interstitial infiltrates bilaterally. This may reflect pulmonary edema of cardiac or noncardiac cause or could reflect pneumonia. There are small bilateral pleural effusions.   Medical Consultants:  Surgery  CCM transfer care to [facility] 1-10 PCT 12/08/2015 IR  Other Consultants:  Nutrition PT  IAnti-Infectives:   Flagyl 11-30-2015 --> stopped Cefepime 12-02-2015 --> 12/10/2015    Signed:  Leisa Lenz, MD  [name] 12/29/2015, 4:10 PM  Pager #: [phone]  Time spent in minutes: more than 30 minutes  Discharge Exam: Filed Vitals:   12/29/15 0521 12/29/15 1300  BP: 119/78 96/75  Pulse: 89 93  Temp: 98.6 F (37 C) 98.5 F (36.9 C)  Resp: 18 20   Filed Vitals:   12/28/15 1352 12/28/15 2049 12/29/15 0521 12/29/15 1300  BP: 121/86 124/81 119/78 96/75  Pulse: 93 93 89 93  Temp: 98.5 F (36.9 C) 98 F (36.7 C) 98.6 F (37 C) 98.5 F (36.9 C)  TempSrc: Oral Oral Oral Oral  Resp: 16 18 18 20   Height:      Weight:      SpO2: 95% 100% 98% 100%    General: Pt is alert, follows commands appropriately, not in acute distress Cardiovascular: Regular rate and rhythm, S1/S2 appreciated  Respiratory: Clear to auscultation bilaterally, no wheezing, no crackles, no rhonchi Abdominal: has 2 drains in place left lateral abd and RUQ arewa, non  tender abdomen  Extremities: no edema, no cyanosis, pulses palpable bilaterally DP and PT Neuro: Grossly nonfocal  Discharge Instructions  Discharge Instructions    Diet - low sodium heart healthy    Complete by:  As directed      Increase activity slowly    Complete by:  As directed             Medication List    STOP taking these medications        aspirin EC 81 MG tablet     gabapentin 300 MG capsule  Commonly known as:  NEURONTIN     glipiZIDE 10 MG tablet  Commonly known as:  GLUCOTROL     ketoconazole 2 % cream  Commonly known as:  NIZORAL     nitroGLYCERIN 0.4 MG SL tablet  Commonly known as:  NITROSTAT     PATADAY 0.2 % Soln  Generic drug:  Olopatadine HCl     polyethylene glycol packet  Commonly known as:  MIRALAX / GLYCOLAX     silver sulfADIAZINE 1 % cream  Commonly known as:  SILVADENE     zolpidem 10 MG tablet  Commonly known as:  AMBIEN      TAKE these medications        acetaminophen 325 MG tablet  Commonly known as:  TYLENOL  Take 2 tablets (650 mg total) by mouth every 6 (six) hours as needed for fever.  albuterol 108 (90 Base) MCG/ACT inhaler  Commonly known as:  PROVENTIL HFA;VENTOLIN HFA  Inhale 2 puffs into the lungs every 6 (six) hours as needed for wheezing or shortness of breath.     alum & mag hydroxide-simeth 200-200-20 MG/5ML suspension  Commonly known as:  MAALOX/MYLANTA  Take 30 mLs by mouth every 6 (six) hours as needed for indigestion or heartburn (or bloating).     antiseptic oral rinse 0.05 % Liqd solution  Commonly known as:  CPC / CETYLPYRIDINIUM CHLORIDE 0.05%  7 mLs by Mouth Rinse route 2 times daily at 12 noon and 4 pm.     bacitracin ointment  Apply topically daily.     bisacodyl 10 MG suppository  Commonly known as:  DULCOLAX  Place 1 suppository (10 mg total) rectally every 12 (twelve) hours as needed for mild constipation or moderate constipation.     collagenase ointment  Commonly known as:  SANTYL   Apply thin layer to wound bed of left foot daily     diphenhydrAMINE 25 mg capsule  Commonly known as:  BENADRYL  Take 1 capsule (25 mg total) by mouth every 6 (six) hours as needed for itching, allergies or sleep (anxiety).     feeding supplement (ENSURE ENLIVE) Liqd  Take 237 mLs by mouth 3 (three) times daily between meals.     insulin aspart 100 UNIT/ML injection  Commonly known as:  novoLOG  Inject 0-15 Units into the skin 3 (three) times daily with meals.     magnesium oxide 400 (241.3 Mg) MG tablet  Commonly known as:  MAG-OX  Take 1 tablet (400 mg total) by mouth 2 (two) times daily.     methocarbamol 500 MG tablet  Commonly known as:  ROBAXIN  Take 2 tablets (1,000 mg total) by mouth 3 (three) times daily.     multivitamin with minerals tablet  Take 4 tablets by mouth 3 (three) times daily.     Oxycodone HCl 20 MG Tabs  Take 1 tablet (20 mg total) by mouth every 4 (four) hours as needed for moderate pain.     thiamine 100 MG tablet  Take 1 tablet (100 mg total) by mouth daily.     triamcinolone cream 0.1 %  Commonly known as:  KENALOG  Apply 1 application topically 2 (two) times daily as needed (for eczema).           Follow-up Information    Follow up with TALBOT, DAVID C, MD In 1 week.   Specialty:  Internal Medicine   Contact information:   [address] [place]  [id number] [phone]        The results of significant diagnostics from this hospitalization (including imaging, microbiology, ancillary and laboratory) are listed below for reference.    Significant Diagnostic Studies: Ct Abdomen Pelvis Wo Contrast  12/14/2015  CLINICAL DATA:  Status post laparoscopic surgery and repair of small bowel perforation. EXAM: CT ABDOMEN AND PELVIS WITHOUT CONTRAST TECHNIQUE: Multidetector CT imaging of the abdomen and pelvis was performed following the standard protocol without IV contrast. COMPARISON:  CT scan of December 07, 2015. FINDINGS:  Severe degenerative disc disease is noted at L5-S1. Mild left pleural effusion is noted with adjacent subsegmental atelectasis. Moderate right pleural effusion is noted with associated atelectasis or pneumonia of the right middle and lower lobes. No gallstones are noted. No focal abnormality is noted in the liver, spleen or pancreas on these unenhanced images. Adrenal glands are unremarkable. Stable large  right renal cyst is noted. No hydronephrosis or renal obstruction is noted. Status post aortobifemoral bypass graft placement for treatment of abdominal aortic aneurysm. There is been interval placement of percutaneous drainage into perihepatic fluid collection. This fluid collection is slightly smaller compared to prior exam. There has also been interval placement of percutaneous drainage catheter into left upper quadrant fluid collection which is significantly smaller compared to prior exam. There is also been interval placement of percutaneous drainage catheter in right lateral peritoneal fluid collection, which is also significantly smaller compared to prior exam. Large amount of contrast is seen throughout the colon which is nondilated. Diffuse wall and fold thickening is noted throughout multiple loops of small bowel suggesting inflammation or enteritis. No significant small bowel dilatation is noted. Urinary bladder appears normal. Moderate anasarca is again noted. Minimal to mild residual ascites is noted. IMPRESSION: Bilateral pleural effusions are noted, with right greater than left. There appears to be some degree of atelectasis or pneumonia involving the right lower and middle lobes. Status post interval placement of 3 percutaneous drainage catheters. There is 1 in the perihepatic fluid collection, which is slightly smaller compared to prior exam. Another is noted in left upper quadrant fluid collection which is significantly smaller compared to prior exam. A third is noted in right lateral peritoneal  fluid collection, which is also significantly smaller and nearly resolved compared to prior exam. Large amount of contrast is seen throughout the colon which is nondilated. Diffuse wall and fold thickening is seen throughout multiple loops small bowel suggesting inflammation or enteritis, but no significant bowel dilatation is noted Electronically Signed   By: Marijo Conception, M.D.   On: 12/14/2015 14:09   Ct Abdomen Pelvis Wo Contrast  12/07/2015  CLINICAL DATA:  Follow-up small bowel obstruction EXAM: CT ABDOMEN AND PELVIS WITHOUT CONTRAST TECHNIQUE: Multidetector CT imaging of the abdomen and pelvis was performed following the standard protocol without IV contrast. COMPARISON:  11/21/2015 FINDINGS: Lung bases shows hazy mild interstitial prominence probable mild interstitial edema. There is small bilateral pleural effusion right greater than left. There is significant atelectasis or infiltrate in right lower lobe posteriorly. Small atelectasis noted left lower lobe posteriorly. Sagittal images of the spine shows degenerative changes thoracic and lumbar spine. Study is limited without IV contrast. There is moderate perihepatic and perisplenic ascites. Please note there is some loculation of the perihepatic ascites with mild mass effect in the liver dome and anterior liver margin, please see axial image 38 and 15. Peritoneal inflammation or infection cannot be entirely excluded. Clinical correlation is necessary. Old appearing right lower anterior rib fracture. No intrahepatic biliary ductal dilatation. Unenhanced pancreas, spleen and adrenal glands are unremarkable. Unenhanced kidneys shows no nephrolithiasis. No hydronephrosis or hydroureter. A large cyst in lower pole of the right kidney again noted measures 6.6 cm. Six in midpole of the left kidney again noted measures 3 cm. Mild distended small bowel loops are noted in mid abdomen containing oral contrast material and some air-fluid levels. Findings most  likely due to significant ileus or less likely improving partial small bowel obstruction. No transition point in caliber of small bowel. There is small amount of contrast material noted in distal colon. Postsurgical changes are noted anterior abdominal wall. Partially open wound noted anterior abdominal wall axial image 67. Clinical correlation is necessary. There is a small right inguinal scrotal canal hernia containing fluid. Small to moderate pelvic ascites is noted. The urinary bladder is unremarkable. Moderate gaseous distension in the mid  sigmoid colon. There is significant anasarca infiltration of subcutaneous fat in abdominal and pelvic wall. 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 Limited assessment without IV contrast. Significant disc space flattening with endplate sclerotic changes and vacuum disc phenomenon at L5-S1 level. Moderate ascites noted bilateral pericolic gutters. Tiny amount of air within urinary bladder probable post instrumentation. IMPRESSION: 1. There is small bilateral pleural effusion right greater than left. Significant atelectasis or infiltrate in right lower lobe posteriorly. There is superimposed mild interstitial prominence bilateral lower lobes probable mild interstitial edema. 2. There is moderate perihepatic and perisplenic ascites. Moderate ascites noted bilateral paracolic gutters. Please note there is some loculation of perihepatic ascites with mild mass effect on the liver contour please see axial images 32 and 15. Peritoneal inflammation or infection cannot be excluded. 3. No hydronephrosis or hydroureter.  Stable bilateral renal cysts. 4. Postsurgical changes are noted anterior abdominal wall. Incomplete healed/open subcutaneous midline anterior abdominal wound. 5. Mild distended small bowel loops containing oral contrast material and some air-fluid levels. There is no transition point in caliber of small bowel. Findings most likely due to significant ileus or  residual partial small bowel obstruction. There is some oral contrast material noted within distal colon. 6. Moderate pelvic ascites. Moderate gas noted within mid sigmoid colon probable mild ileus. 7. Significant anasarca infiltration of subcutaneous fat abdominal and pelvic wall. 8. 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 Electronically Signed   By: Lahoma Crocker M.D.   On: 12/07/2015 15:18   Dg Chest 2 View  12/14/2015  CLINICAL DATA:  65 year old male with postoperative respiratory failure, hypoxemia and sepsis. EXAM: CHEST  2 VIEW COMPARISON:  12/06/2015 chest radiograph.  12/14/2015 abdominal CT. FINDINGS: The cardiomediastinal silhouette is unchanged. A left IJ central venous catheter with tip overlying the lower SVC again noted. Moderate bilateral pleural effusions have increased in size, right greater than left. Increasing bilateral lower lung atelectasis/ consolidation noted. There is no evidence of pneumothorax. Pulmonary vascular congestion is present. IMPRESSION: Enlarging moderate bilateral pleural effusions, right greater than left, with increasing bilateral lower lung atelectasis/ consolidation. Pulmonary vascular congestion. Electronically Signed   By: Margarette Canada M.D.   On: 12/14/2015 19:11   Ct Abdomen Pelvis W Contrast  12/25/2015  CLINICAL DATA:  Abscess of abdominal cavity. EXAM: CT ABDOMEN AND PELVIS WITH CONTRAST TECHNIQUE: Multidetector CT imaging of the abdomen and pelvis was performed using the standard protocol following bolus administration of intravenous contrast. CONTRAST:  159mL OMNIPAQUE IOHEXOL 300 MG/ML  SOLN COMPARISON:  CT scan of December 14, 2015 and drainage procedure of December 15, 2015. FINDINGS: Severe degenerative disc disease is noted at L5-S1. Bilateral pleural effusions are noted with adjacent subsegmental atelectasis, with left greater than right. No gallstones are noted. Right perihepatic crescent shaped fluid collection is significantly smaller  compared to prior exam status post placement of drainage catheter, which is seen over the dome of right hepatic lobe. It currently measures 9.7 x 1.8 cm. Drainage catheter seen in left perisplenic fluid collection is unchanged in position, with no significant change in the size of this fluid collection pancreas appears normal. Bilateral renal cysts are unchanged. Adrenal glands appear normal. No hydronephrosis or renal obstruction is noted. Drainage catheter noted in right lateral fluid collection on prior study has been removed. 4.6 x 2.8 cm residual fluid collection is noted. Left lateral peritoneal fluid collection is noted which measures 7.6 x 1.5 cm which may be slightly smaller compared to prior exam. There appears  to be no evidence of bowel obstruction. Large amount of stool is noted in the sigmoid colon and rectum. Urinary bladder appears normal. Multiple other smaller fluid collections are noted posteriorly in the pelvis, with the largest measuring 3.1 x 1.4 cm. IMPRESSION: Stable bilateral pleural effusions are noted with adjacent subsegmental atelectasis, left greater than right. Right para Padda crescent shaped fluid collection is significantly smaller compared to prior exam, status post placement of new drainage catheter, tip of which is seen over dome of right hepatic lobe. No change seen in position of drainage catheter within left perisplenic fluid collection, which is unchanged in size compared to prior exam. There is been interval removal of drainage catheter from right lateral fluid collection noted on prior exam, with residual fluid collection measuring 4.6 x 2.8 cm which is not significantly changed compared to prior exam. Left lateral peritoneal fluid collection is noted measuring 7.6 x 1.5 cm which may be slightly smaller compared to prior exam. There are noted multiple smaller and probably loculated fluid collections seen posteriorly in the pelvis, with the largest single collection measuring  3.1 x 1.4 cm. Electronically Signed   By: Marijo Conception, M.D.   On: 12/25/2015 16:33   Ir Sinus/fist Tube Chk-non Gi  12/15/2015  CLINICAL DATA:  65 yo male with a history of perforated small bowel and acute abdomen status post exploratory laparotomy, washout and small bowel repair. Rib his postoperative course has been complicated by development of multiple intra-abdominal abscesses. Percutaneous drainage was performed 12/08/2015 with placement of 3 drainage catheters. CT imaging earlier today demonstrates that the right lower quadrant drainage catheter has successfully drained at the intended fluid collection. This drain can be removed. Both of the upper quadrant drainage catheters need to be repositioned into the subdiaphragmatic spaces to drain persistent fluid in those regions. EXAM: EXAM SINUS TRACT INJECTION/FISTULOGRAM Date: 12/15/2015 PROCEDURE: PROCEDURE 1. Removal of right lower quadrant drain 2. Attempted repositioning of right upper quadrant drain with ultimate removal of drainage catheter 3. Successful repositioning and up size of left upper quadrant drain Interventional Radiologist: Criselda Peaches, MD ANESTHESIA/SEDATION: ANESTHESIA/SEDATION 50 mcg fentanyl administered during the course of the procedure MEDICATIONS: MEDICATIONS None additional FLUOROSCOPY TIME:  11 minutes 30 seconds for a total of 167 mGy CONTRAST:  40mL OMNIPAQUE IOHEXOL 300 MG/ML SOLN TECHNIQUE: TECHNIQUE Informed consent was obtained from the patient following explanation of the procedure, risks, benefits and alternatives. The patient understands, agrees and consents for the procedure. All questions were addressed. A time out was performed. Maximal barrier sterile technique utilized including caps, mask, sterile gowns, sterile gloves, large sterile drape, hand hygiene, and Betadine skin prep. The right lower quadrant drainage catheter was cut and removed. Contrast was injected through the right upper quadrant drainage  catheter. This confirms that there is no residual abscess cavity at the drainage a loop of the catheter. However, shortly beyond the entry point of the catheter into the abdominal wall there is a communication with a larger fluid collection in the subdiaphragmatic space. This catheter was cut and removed over a Bentson wire. A 5 French catheter was navigated over the wire. Multiple attempts with several wires were made in an effort to reposition the wire into the subdiaphragmatic space. This was ultimately unsuccessful. The decision was made to pursue replacement of the right upper quadrant drainage catheter under CT guidance. Attention was turned to the left upper quadrant drainage catheter. Contrast was injected, again, shortly beyond the entry point into the left upper  quadrant there is a communication with the larger subdiaphragmatic fluid collection. The fluid collection around the draining loop of the catheter has resolved. This catheter was cut and removed over a Bentson wire. Using a 5 French angled catheter and glidewire, the eat wire was successfully navigated into did subdiaphragmatic space. The catheter was advanced into the space in the glidewire exchanged for an Amplatz wire. A Cook 12 [place] all-purpose drainage catheter was advanced over the wire and formed in the subdiaphragmatic space. There was immediate return of approximately 150 cc of opaque tan fluid and debris. The catheter was secured in place with 0 Prolene suture and connected to JP bulb drainage. Bandages were applied. The patient tolerated the procedure well. COMPLICATIONS: COMPLICATIONS None IMPRESSION: IMPRESSION 1. The right lower quadrant drain was removed. 2. Unsuccessful attempted repositioning of the right upper quadrant drainage catheter. The catheter could not be manipulated up into the subdiaphragmatic space and was therefore removed. This catheter will be replaced under CT guidance tomorrow 12/14/14. 3. Successful repositioning  of the left upper quadrant drainage catheter into the subdiaphragmatic collection with return of copious opaque tan colored fluid and debris. Electronically Signed   By: Jacqulynn Cadet M.D.   On: 12/15/2015 17:08   Ir Sinus/fist Tube Chk-non Gi  12/15/2015  CLINICAL DATA:  65 yo male with a history of perforated small bowel and acute abdomen status post exploratory laparotomy, washout and small bowel repair. Rib his postoperative course has been complicated by development of multiple intra-abdominal abscesses. Percutaneous drainage was performed 12/08/2015 with placement of 3 drainage catheters. CT imaging earlier today demonstrates that the right lower quadrant drainage catheter has successfully drained at the intended fluid collection. This drain can be removed. Both of the upper quadrant drainage catheters need to be repositioned into the subdiaphragmatic spaces to drain persistent fluid in those regions. EXAM: SINUS TRACT INJECTION/FISTULOGRAM Date: 12/15/2015 PROCEDURE: 1. Removal of right lower quadrant drain 2. Attempted repositioning of right upper quadrant drain with ultimate removal of drainage catheter 3. Successful repositioning and up size of left upper quadrant drain Interventional Radiologist:  Criselda Peaches, MD ANESTHESIA/SEDATION: 50 mcg fentanyl administered during the course of the procedure MEDICATIONS: None additional FLUOROSCOPY TIME:  11 minutes 30 seconds for a total of 167 mGy CONTRAST:  69mL OMNIPAQUE IOHEXOL 300 MG/ML  SOLN TECHNIQUE: Informed consent was obtained from the patient following explanation of the procedure, risks, benefits and alternatives. The patient understands, agrees and consents for the procedure. All questions were addressed. A time out was performed. Maximal barrier sterile technique utilized including caps, mask, sterile gowns, sterile gloves, large sterile drape, hand hygiene, and Betadine skin prep. The right lower quadrant drainage catheter was cut and  removed. Contrast was injected through the right upper quadrant drainage catheter. This confirms that there is no residual abscess cavity at the drainage a loop of the catheter. However, shortly beyond the entry point of the catheter into the abdominal wall there is a communication with a larger fluid collection in the subdiaphragmatic space. This catheter was cut and removed over a Bentson wire. A 5 French catheter was navigated over the wire. Multiple attempts with several wires were made in an effort to reposition the wire into the subdiaphragmatic space. This was ultimately unsuccessful. The decision was made to pursue replacement of the right upper quadrant drainage catheter under CT guidance. Attention was turned to the left upper quadrant drainage catheter. Contrast was injected, again, shortly beyond the entry point into the left upper  quadrant there is a communication with the larger subdiaphragmatic fluid collection. The fluid collection around the draining loop of the catheter has resolved. This catheter was cut and removed over a Bentson wire. Using a 5 French angled catheter and glidewire, the eat wire was successfully navigated into did subdiaphragmatic space. The catheter was advanced into the space in the glidewire exchanged for an Amplatz wire. A Cook 12 [place] all-purpose drainage catheter was advanced over the wire and formed in the subdiaphragmatic space. There was immediate return of approximately 150 cc of opaque tan fluid and debris. The catheter was secured in place with 0 Prolene suture and connected to JP bulb drainage. Bandages were applied. The patient tolerated the procedure well. COMPLICATIONS: None IMPRESSION: 1. The right lower quadrant drain was removed. 2. Unsuccessful attempted repositioning of the right upper quadrant drainage catheter. The catheter could not be manipulated up into the subdiaphragmatic space and was therefore removed. This catheter will be replaced under CT  guidance tomorrow 12/14/14. 3. Successful repositioning of the left upper quadrant drainage catheter into the subdiaphragmatic collection with return of copious opaque tan colored fluid and debris. Signed, Criselda Peaches, MD [facility] Rad[facility] Electronically Signed   By: Jacqulynn Cadet M.D.   On: 12/15/2015 07:49   Ir Catheter Tube Change  12/15/2015  CLINICAL DATA:  65 yo male with a history of perforated small bowel and acute abdomen status post exploratory laparotomy, washout and small bowel repair. Rib his postoperative course has been complicated by development of multiple intra-abdominal abscesses. Percutaneous drainage was performed 12/08/2015 with placement of 3 drainage catheters. CT imaging earlier today demonstrates that the right lower quadrant drainage catheter has successfully drained at the intended fluid collection. This drain can be removed. Both of the upper quadrant drainage catheters need to be repositioned into the subdiaphragmatic spaces to drain persistent fluid in those regions. EXAM: EXAM SINUS TRACT INJECTION/FISTULOGRAM Date: 12/15/2015 PROCEDURE: PROCEDURE 1. Removal of right lower quadrant drain 2. Attempted repositioning of right upper quadrant drain with ultimate removal of drainage catheter 3. Successful repositioning and up size of left upper quadrant drain Interventional Radiologist: Criselda Peaches, MD ANESTHESIA/SEDATION: ANESTHESIA/SEDATION 50 mcg fentanyl administered during the course of the procedure MEDICATIONS: MEDICATIONS None additional FLUOROSCOPY TIME:  11 minutes 30 seconds for a total of 167 mGy CONTRAST:  18mL OMNIPAQUE IOHEXOL 300 MG/ML SOLN TECHNIQUE: TECHNIQUE Informed consent was obtained from the patient following explanation of the procedure, risks, benefits and alternatives. The patient understands, agrees and consents for the procedure. All questions were addressed. A time out was performed.  Maximal barrier sterile technique utilized including caps, mask, sterile gowns, sterile gloves, large sterile drape, hand hygiene, and Betadine skin prep. The right lower quadrant drainage catheter was cut and removed. Contrast was injected through the right upper quadrant drainage catheter. This confirms that there is no residual abscess cavity at the drainage a loop of the catheter. However, shortly beyond the entry point of the catheter into the abdominal wall there is a communication with a larger fluid collection in the subdiaphragmatic space. This catheter was cut and removed over a Bentson wire. A 5 French catheter was navigated over the wire. Multiple attempts with several wires were made in an effort to reposition the wire into the subdiaphragmatic space. This was ultimately unsuccessful. The decision was made to pursue replacement of the right upper quadrant drainage catheter under CT guidance. Attention was turned to the left upper quadrant drainage catheter. Contrast  was injected, again, shortly beyond the entry point into the left upper quadrant there is a communication with the larger subdiaphragmatic fluid collection. The fluid collection around the draining loop of the catheter has resolved. This catheter was cut and removed over a Bentson wire. Using a 5 French angled catheter and glidewire, the eat wire was successfully navigated into did subdiaphragmatic space. The catheter was advanced into the space in the glidewire exchanged for an Amplatz wire. A Cook 12 [place] all-purpose drainage catheter was advanced over the wire and formed in the subdiaphragmatic space. There was immediate return of approximately 150 cc of opaque tan fluid and debris. The catheter was secured in place with 0 Prolene suture and connected to JP bulb drainage. Bandages were applied. The patient tolerated the procedure well. COMPLICATIONS: COMPLICATIONS None IMPRESSION: IMPRESSION 1. The right lower quadrant drain was  removed. 2. Unsuccessful attempted repositioning of the right upper quadrant drainage catheter. The catheter could not be manipulated up into the subdiaphragmatic space and was therefore removed. This catheter will be replaced under CT guidance tomorrow 12/14/14. 3. Successful repositioning of the left upper quadrant drainage catheter into the subdiaphragmatic collection with return of copious opaque tan colored fluid and debris. Electronically Signed   By: Jacqulynn Cadet M.D.   On: 12/15/2015 17:08   Dg Chest Port 1 View  12/06/2015  CLINICAL DATA:  Encounter for central line placement. EXAM: PORTABLE CHEST 1 VIEW COMPARISON:  December 05, 2015. FINDINGS: The heart size and mediastinal contours are within normal limits. No pneumothorax is noted. Stable mild interstitial densities are noted throughout both lungs consistent with pulmonary edema. Stable right basilar opacity is noted concerning for atelectasis with associated pleural effusion. Nasogastric tube has been removed. Right-sided PICC line is again noted with tip at cavoatrial junction. Interval placement of left internal jugular catheter with distal tip in SVC. The visualized skeletal structures are unremarkable. IMPRESSION: Stable bilateral pulmonary edema, with right basilar subsegmental atelectasis and associated pleural effusion. Interval placement of left internal jugular catheter with distal tip in SVC. No pneumothorax is noted. Electronically Signed   By: Marijo Conception, M.D.   On: 12/06/2015 16:10   Dg Chest Port 1 View  12/05/2015  CLINICAL DATA:  Small bowel obstruction, acute onset of hypoxia ; history of diabetes, coronary artery disease with stent placement, peripheral vascular disease, current smoker. EXAM: PORTABLE CHEST 1 VIEW COMPARISON:  Chest x-ray of December 03, 2015 FINDINGS: Acute worsening of pulmonary interstitial density has occurred bilaterally. The right hemidiaphragm is now obscured. There is partial obscuration of the  left hemidiaphragm. There are small bilateral pleural effusions. The heart is not enlarged. The pulmonary vascularity is not clearly engorged. The right-sided PICC line tip projects over the distal third of the SVC. The esophagogastric tube tip projects below the inferior margin of the image. IMPRESSION: Worsening of pulmonary interstitial infiltrates bilaterally. This may reflect pulmonary edema of cardiac or noncardiac cause or could reflect pneumonia. There are small bilateral pleural effusions. Electronically Signed   By: David  Martinique M.D.   On: 12/05/2015 08:52   Dg Chest Port 1 View  12/03/2015  CLINICAL DATA:  Acute respiratory failure. History of hypertension, smoking. EXAM: PORTABLE CHEST 1 VIEW COMPARISON:  Chest x-rays dated 12/01/2015, 11/30/2015 and 11/21/2015. FINDINGS: Right-sided PICC line remains well positioned with tip projected over the expected region of the cavoatrial junction. Nasogastric tube passes below the diaphragm. Heart size is upper normal, unchanged. Overall cardiomediastinal silhouette is stable in size and  configuration. Endotracheal tube has been removed in the interval. Dense opacity at the left lung base is unchanged, probably atelectasis. Mildly prominent interstitial markings persist bilaterally suggesting mild edema, perhaps mildly increased on the right. Veiled opacities at each lung base suggests additional small bilateral pleural effusions. IMPRESSION: 1. Perhaps mildly increased interstitial edema on the right. 2. Endotracheal tube has been removed. 3. No other significant interval change. Dense opacity at the left lung base is unchanged, atelectasis versus pneumonia. Mild bilateral interstitial edema. Probable small bilateral pleural effusions. Electronically Signed   By: Franki Cabot M.D.   On: 12/03/2015 07:56   Dg Chest Port 1 View  12/01/2015  CLINICAL DATA:  Endotracheal tube placement ; sepsis, encephalopathy, coronary artery disease, current smoker. EXAM:  PORTABLE CHEST 1 VIEW COMPARISON:  Portable chest x-ray of November 30, 2015 FINDINGS: There is been interval intubation of the trachea. The tip of the tube lies approximately 6.4 cm above the carina. There bibasilar infiltrates. The heart is normal in size. The pulmonary vascularity is not engorged. The esophagogastric tube tip projects below the inferior margin of the image. The right-sided PICC line tip projects over the proximal SVC. IMPRESSION: 1. Interval intubation of the trachea with reasonable positioning of the endotracheal tube. The other support tubes are in reasonable position. 2. Bibasilar alveolar opacities consistent with pneumonia. Electronically Signed   By: David  Martinique M.D.   On: 12/01/2015 16:14   Dg Chest Port 1 View  11/30/2015  CLINICAL DATA:  PICC placement. Systemic inflammatory response syndrome. Altered mental status. EXAM: PORTABLE CHEST 1 VIEW COMPARISON:  Single view of the chest 11/21/2015. PA and lateral chest 01/17/2015. FINDINGS: NG tube is in place and courses into the stomach and below the inferior margin of the film. Right side PICC is also identified with the tip projecting in the lower superior vena cava. Lung volumes are lower than on the comparison studies with basilar atelectasis. No pneumothorax or pleural effusion. Heart size is normal. IMPRESSION: NG tube courses into the stomach and below the inferior margin of film. PICC projects in good position. Bibasilar atelectasis in a low volume chest. Electronically Signed   By: Inge Rise M.D.   On: 11/30/2015 07:25   Dg Abd 2 Views  12/01/2015  CLINICAL DATA:  Follow up small bowel obstruction. EXAM: ABDOMEN - 2 VIEW COMPARISON:  Multiple prior radiographs obtained over the last week. FINDINGS: The nasogastric tube appears unchanged, terminating in the mid abdomen. Small bowel distention in the upper abdomen is unchanged. There is some gas within the colon. There is no free intraperitoneal air on the decubitus view.  Vascular stents are noted. IMPRESSION: No significant change in residual partial small bowel obstruction compared with recent priors. The degree of bowel distention is improved from the studies done last week. Electronically Signed   By: Richardean Sale M.D.   On: 12/01/2015 08:44   Dg Abd Portable 1v  11/30/2015  CLINICAL DATA:  Nasogastric tube placement.  Initial encounter. EXAM: PORTABLE ABDOMEN - 1 VIEW COMPARISON:  Abdominal radiograph performed 11/29/2015 FINDINGS: The patient's nasogastric tube is noted ending overlying the body of the stomach. There is increased dilatation of small bowel loops, measuring up to 4.8 cm in diameter, concerning for worsening small bowel obstruction. No free intra-abdominal air is seen, though evaluation free air is limited on a single supine view. No acute osseous abnormalities are identified. IMPRESSION: 1. Nasogastric tube noted ending overlying the body of the stomach. 2. Diffuse dilatation of small-bowel  loops, measuring up to 4.8 cm in diameter, concerning for worsening small bowel obstruction. No free intra-abdominal air seen. Electronically Signed   By: Garald Balding M.D.   On: 11/30/2015 02:35   Ct Image Guided Fluid Drain By Catheter  12/15/2015  CLINICAL DATA:  Small bowel perforation with multiple post-operative peritoneal fluid collections and increasing leukocytosis. Multiple percutaneous drainage catheters have previously been placed. There is a large undrained right subphrenic collection. EXAM: CT GUIDED DRAINAGE OF RIGHT SUBPHRENIC ABSCESS ANESTHESIA/SEDATION: Intravenous Fentanyl and Versed were administered as conscious sedation during continuous monitoring of the patient's level of consciousness and physiological / cardiorespiratory status by the radiology RN, with a total moderate sedation time of 10 minutes. PROCEDURE: The procedure, risks, benefits, and alternatives were explained to the patient. Questions regarding the procedure were encouraged and  answered. The patient understands and consents to the procedure. Select axial scans through the upper abdomen were obtained. Patient was placed in left posterior oblique position an additional scans obtained. The right subphrenic collection was localized and an appropriate skin entry site was identified and marked. The operative field was prepped with Betadinein a sterile fashion, and a sterile drape was applied covering the operative field. A sterile gown and sterile gloves were used for the procedure. Local anesthesia was provided with 1% Lidocaine. Under CT fluoroscopic guidance, a 19 gauge percutaneous entry needle was advanced into the inferior aspect of the collection. Thin somewhat cloudy pale yellow fluid could be aspirated. A guidewire advanced easily within the collection, its position confirmed on CT fluoro. Tract was dilated to facilitate placement of a 12 French pigtail catheter, formed centrally in the subphrenic component of the collection. 10 mL of opaque yellowish thin fluid were aspirated sent for Gram stain, culture and sensitivity. Catheter secured externally with 0 Prolene suture and StatLock and placed to gravity drain bag. The patient tolerated the procedure well. COMPLICATIONS: None immediate FINDINGS: Limited CT demonstrates some interval increase in bilateral pleural effusions since prior scan of 12/14/2015. Center venous catheter to the distal SVC. Patchy coronary calcifications. There is a complex gas and fluid collection extending from anterior to the right hepatic lobe into the right subhepatic space. There is no contains some contrast material since yesterday's catheter injection. The left upper quadrant drain catheter has been redirected into the subphrenic collection which has significantly resolved since the previous day's exam. Percutaneous 12 French pigtail drain catheter placed into the right subphrenic collection as detailed above. IMPRESSION: 1. Technically successful CT-guided  right subphrenic abscess drain catheter placement. Electronically Signed   By: Lucrezia Europe M.D.   On: 12/15/2015 14:57   Ct Image Guided Drainage By Percutaneous Catheter  12/08/2015  CLINICAL DATA:  Small bowel perforation with multiple post- operative peritoneal fluid collections and increasing leukocytosis. EXAM: 1. CT-GUIDED PERCUTANEOUS DRAINAGE OF PERIHEPATIC PERITONEAL ABSCESS 2. CT-GUIDED PERCUTANEOUS DRAINAGE OF RIGHT LOWER QUADRANT PERITONEAL ABSCESS 3. CT-GUIDED PERCUTANEOUS DRAINAGE OF LEFT UPPER QUADRANT PERITONEAL ABSCESS 4. CT-GUIDED PERCUTANEOUS DRAINAGE OF LEFT PELVIC PERITONEAL ABSCESS ANESTHESIA/SEDATION: 5.0 Mg IV Versed 100 mcg IV Fentanyl Total Moderate Sedation Time:  55 minutes PROCEDURE: The procedure, risks, benefits, and alternatives were explained to the patient. Questions regarding the procedure were encouraged and answered. The patient understands and consents to the procedure. CT was performed in a supine position. The right and left abdominal walls were prepped with Betadine in a sterile fashion, and a sterile drape was applied covering the operative field. A sterile gown and sterile gloves were used for the procedure.  Local anesthesia was provided with 1% Lidocaine. Under CT guidance, a 18 gauge needle was advanced into a right upper quadrant perihepatic abscess. Aspiration of fluid was performed and a sample sent for culture analysis. A guidewire was advanced into the collection. A 10 French percutaneous drainage catheter was then advanced over the wire. Catheter positioning was confirmed by CT. Under CT guidance, an 18 gauge needle was advanced into a left upper quadrant abscess. A fluid sample was aspirated and sent for culture analysis. A guidewire was advanced. A 10 French percutaneous drainage catheter was advanced over the wire. Catheter positioning was confirmed by CT. Under CT guidance, an 18 gauge needle was advanced into a right lower quadrant abscess. Fluid was  aspirated and a sample sent for culture analysis. A guidewire was placed. The tract was dilated and a 10 French percutaneous drainage catheter placed. Catheter positioning was confirmed by CT. Under CT guidance, a 5 [place] Yueh centesis catheter was inserted into abscess fluid localizing to the left pelvis. After confirming catheter positioning by CT, manual aspiration drainage was performed with syringes. The drainage catheter was slowly retracted. The catheter was then removed. The 3 percutaneous drains were secured with Prolene retention sutures and StatLock devices. All 3 drains were connected to suction bulbs. COMPLICATIONS: None FINDINGS: The right perihepatic collection yielded bloody fluid. The other collections yielded yellowish and slightly turbid fluid. The left pelvic fluid was the least turbid and appeared quite clear. 60 mL of fluid was able to be aspirated from a pocket in the left pelvis with no further return. An indwelling drain was not left in place in this region. IMPRESSION: CT-guided catheter drainage of 4 separate fluid collections in the peritoneal cavity. 10 French drains were placed in a right upper quadrant perihepatic abscess which yielded bloody fluid, a left upper quadrant abscess yielding yellowish turbid fluid and a right lower quadrant abscess also yielding slightly turbid yellowish fluid. A left pelvic fluid collection was drained with a 5 French centesis catheter yielding 60 mL of serous fluid. A drain was not left in place in this collection. Electronically Signed   By: Aletta Edouard M.D.   On: 12/08/2015 15:44   Ct Image Guided Drainage Percut Cath  Peritoneal Retroperit  12/27/2015  INDICATION: Abdominal abscess on the right and left flanks EXAM: CT GUIDED DRAINAGE OF BILATERAL ABSCESS MEDICATIONS: The patient is currently admitted to the hospital and receiving intravenous antibiotics. The antibiotics were administered within an appropriate time frame prior to the initiation  of the procedure. ANESTHESIA/SEDATION: 0.5 mg IV Versed 25 mcg IV Fentanyl Moderate Sedation Time:  13 The patient was continuously monitored during the procedure by the interventional radiology nurse under my direct supervision. COMPLICATIONS: None immediate. TECHNIQUE: Informed written consent was obtained from the patient after a thorough discussion of the procedural risks, benefits and alternatives. All questions were addressed. Maximal Sterile Barrier Technique was utilized including caps, mask, sterile gowns, sterile gloves, sterile drape, hand hygiene and skin antiseptic. A timeout was performed prior to the initiation of the procedure. PROCEDURE: The abdominal region was prepped with ChloraPrep in a sterile fashion, and a sterile drape was applied covering the operative field. A sterile gown and sterile gloves were used for the procedure. Local anesthesia was provided with 1% Lidocaine. Under CT guidance, a Yueh Angiocath was inserted into the left flank abscess. 5 cc of pus was aspirated. Under CT guidance, a Teressa Lower Angiocath was placed in the right flank abscess and an additional 5 cc of  pus was aspirated. Samples were sent for culture. FINDINGS: Images document you we Angiocath placement in the right and left flank abscesses. Post aspiration imaging demonstrates no hemorrhage and some improvement in the size of the abscesses. IMPRESSION: Successful right and left pelvic abscess drainage utilizing UE Angiocath. Electronically Signed   By: Marybelle Killings M.D.   On: 12/27/2015 16:13   Ct Image Guided Drainage Percut Cath  Peritoneal Retroperit  12/27/2015  INDICATION: Abdominal abscess on the right and left flanks EXAM: CT GUIDED DRAINAGE OF BILATERAL ABSCESS MEDICATIONS: The patient is currently admitted to the hospital and receiving intravenous antibiotics. The antibiotics were administered within an appropriate time frame prior to the initiation of the procedure. ANESTHESIA/SEDATION: 0.5 mg IV Versed 25 mcg  IV Fentanyl Moderate Sedation Time:  13 The patient was continuously monitored during the procedure by the interventional radiology nurse under my direct supervision. COMPLICATIONS: None immediate. TECHNIQUE: Informed written consent was obtained from the patient after a thorough discussion of the procedural risks, benefits and alternatives. All questions were addressed. Maximal Sterile Barrier Technique was utilized including caps, mask, sterile gowns, sterile gloves, sterile drape, hand hygiene and skin antiseptic. A timeout was performed prior to the initiation of the procedure. PROCEDURE: The abdominal region was prepped with ChloraPrep in a sterile fashion, and a sterile drape was applied covering the operative field. A sterile gown and sterile gloves were used for the procedure. Local anesthesia was provided with 1% Lidocaine. Under CT guidance, a Yueh Angiocath was inserted into the left flank abscess. 5 cc of pus was aspirated. Under CT guidance, a Teressa Lower Angiocath was placed in the right flank abscess and an additional 5 cc of pus was aspirated. Samples were sent for culture. FINDINGS: Images document you we Angiocath placement in the right and left flank abscesses. Post aspiration imaging demonstrates no hemorrhage and some improvement in the size of the abscesses. IMPRESSION: Successful right and left pelvic abscess drainage utilizing UE Angiocath. Electronically Signed   By: Marybelle Killings M.D.   On: 12/27/2015 16:13   Ct Image Guided Drainage Percut Cath  Peritoneal Retroperit  12/08/2015  CLINICAL DATA:  Small bowel perforation with multiple post- operative peritoneal fluid collections and increasing leukocytosis. EXAM: 1. CT-GUIDED PERCUTANEOUS DRAINAGE OF PERIHEPATIC PERITONEAL ABSCESS 2. CT-GUIDED PERCUTANEOUS DRAINAGE OF RIGHT LOWER QUADRANT PERITONEAL ABSCESS 3. CT-GUIDED PERCUTANEOUS DRAINAGE OF LEFT UPPER QUADRANT PERITONEAL ABSCESS 4. CT-GUIDED PERCUTANEOUS DRAINAGE OF LEFT PELVIC PERITONEAL  ABSCESS ANESTHESIA/SEDATION: 5.0 Mg IV Versed 100 mcg IV Fentanyl Total Moderate Sedation Time:  55 minutes PROCEDURE: The procedure, risks, benefits, and alternatives were explained to the patient. Questions regarding the procedure were encouraged and answered. The patient understands and consents to the procedure. CT was performed in a supine position. The right and left abdominal walls were prepped with Betadine in a sterile fashion, and a sterile drape was applied covering the operative field. A sterile gown and sterile gloves were used for the procedure. Local anesthesia was provided with 1% Lidocaine. Under CT guidance, a 18 gauge needle was advanced into a right upper quadrant perihepatic abscess. Aspiration of fluid was performed and a sample sent for culture analysis. A guidewire was advanced into the collection. A 10 French percutaneous drainage catheter was then advanced over the wire. Catheter positioning was confirmed by CT. Under CT guidance, an 18 gauge needle was advanced into a left upper quadrant abscess. A fluid sample was aspirated and sent for culture analysis. A guidewire was advanced. A 10 French percutaneous drainage  catheter was advanced over the wire. Catheter positioning was confirmed by CT. Under CT guidance, an 18 gauge needle was advanced into a right lower quadrant abscess. Fluid was aspirated and a sample sent for culture analysis. A guidewire was placed. The tract was dilated and a 10 French percutaneous drainage catheter placed. Catheter positioning was confirmed by CT. Under CT guidance, a 5 [place] Yueh centesis catheter was inserted into abscess fluid localizing to the left pelvis. After confirming catheter positioning by CT, manual aspiration drainage was performed with syringes. The drainage catheter was slowly retracted. The catheter was then removed. The 3 percutaneous drains were secured with Prolene retention sutures and StatLock devices. All 3 drains were connected to  suction bulbs. COMPLICATIONS: None FINDINGS: The right perihepatic collection yielded bloody fluid. The other collections yielded yellowish and slightly turbid fluid. The left pelvic fluid was the least turbid and appeared quite clear. 60 mL of fluid was able to be aspirated from a pocket in the left pelvis with no further return. An indwelling drain was not left in place in this region. IMPRESSION: CT-guided catheter drainage of 4 separate fluid collections in the peritoneal cavity. 10 French drains were placed in a right upper quadrant perihepatic abscess which yielded bloody fluid, a left upper quadrant abscess yielding yellowish turbid fluid and a right lower quadrant abscess also yielding slightly turbid yellowish fluid. A left pelvic fluid collection was drained with a 5 French centesis catheter yielding 60 mL of serous fluid. A drain was not left in place in this collection. Electronically Signed   By: Aletta Edouard M.D.   On: 12/08/2015 15:44   Ct Image Guided Drainage Percut Cath  Peritoneal Retroperit  12/08/2015  CLINICAL DATA:  Small bowel perforation with multiple post- operative peritoneal fluid collections and increasing leukocytosis. EXAM: 1. CT-GUIDED PERCUTANEOUS DRAINAGE OF PERIHEPATIC PERITONEAL ABSCESS 2. CT-GUIDED PERCUTANEOUS DRAINAGE OF RIGHT LOWER QUADRANT PERITONEAL ABSCESS 3. CT-GUIDED PERCUTANEOUS DRAINAGE OF LEFT UPPER QUADRANT PERITONEAL ABSCESS 4. CT-GUIDED PERCUTANEOUS DRAINAGE OF LEFT PELVIC PERITONEAL ABSCESS ANESTHESIA/SEDATION: 5.0 Mg IV Versed 100 mcg IV Fentanyl Total Moderate Sedation Time:  55 minutes PROCEDURE: The procedure, risks, benefits, and alternatives were explained to the patient. Questions regarding the procedure were encouraged and answered. The patient understands and consents to the procedure. CT was performed in a supine position. The right and left abdominal walls were prepped with Betadine in a sterile fashion, and a sterile drape was applied covering the  operative field. A sterile gown and sterile gloves were used for the procedure. Local anesthesia was provided with 1% Lidocaine. Under CT guidance, a 18 gauge needle was advanced into a right upper quadrant perihepatic abscess. Aspiration of fluid was performed and a sample sent for culture analysis. A guidewire was advanced into the collection. A 10 French percutaneous drainage catheter was then advanced over the wire. Catheter positioning was confirmed by CT. Under CT guidance, an 18 gauge needle was advanced into a left upper quadrant abscess. A fluid sample was aspirated and sent for culture analysis. A guidewire was advanced. A 10 French percutaneous drainage catheter was advanced over the wire. Catheter positioning was confirmed by CT. Under CT guidance, an 18 gauge needle was advanced into a right lower quadrant abscess. Fluid was aspirated and a sample sent for culture analysis. A guidewire was placed. The tract was dilated and a 10 French percutaneous drainage catheter placed. Catheter positioning was confirmed by CT. Under CT guidance, a 5 [place] Yueh centesis catheter was inserted into abscess  fluid localizing to the left pelvis. After confirming catheter positioning by CT, manual aspiration drainage was performed with syringes. The drainage catheter was slowly retracted. The catheter was then removed. The 3 percutaneous drains were secured with Prolene retention sutures and StatLock devices. All 3 drains were connected to suction bulbs. COMPLICATIONS: None FINDINGS: The right perihepatic collection yielded bloody fluid. The other collections yielded yellowish and slightly turbid fluid. The left pelvic fluid was the least turbid and appeared quite clear. 60 mL of fluid was able to be aspirated from a pocket in the left pelvis with no further return. An indwelling drain was not left in place in this region. IMPRESSION: CT-guided catheter drainage of 4 separate fluid collections in the peritoneal cavity. 10  French drains were placed in a right upper quadrant perihepatic abscess which yielded bloody fluid, a left upper quadrant abscess yielding yellowish turbid fluid and a right lower quadrant abscess also yielding slightly turbid yellowish fluid. A left pelvic fluid collection was drained with a 5 French centesis catheter yielding 60 mL of serous fluid. A drain was not left in place in this collection. Electronically Signed   By: Aletta Edouard M.D.   On: 12/08/2015 15:44   Ct Image Guided Drainage Percut Cath  Peritoneal Retroperit  12/08/2015  CLINICAL DATA:  Small bowel perforation with multiple post- operative peritoneal fluid collections and increasing leukocytosis. EXAM: 1. CT-GUIDED PERCUTANEOUS DRAINAGE OF PERIHEPATIC PERITONEAL ABSCESS 2. CT-GUIDED PERCUTANEOUS DRAINAGE OF RIGHT LOWER QUADRANT PERITONEAL ABSCESS 3. CT-GUIDED PERCUTANEOUS DRAINAGE OF LEFT UPPER QUADRANT PERITONEAL ABSCESS 4. CT-GUIDED PERCUTANEOUS DRAINAGE OF LEFT PELVIC PERITONEAL ABSCESS ANESTHESIA/SEDATION: 5.0 Mg IV Versed 100 mcg IV Fentanyl Total Moderate Sedation Time:  55 minutes PROCEDURE: The procedure, risks, benefits, and alternatives were explained to the patient. Questions regarding the procedure were encouraged and answered. The patient understands and consents to the procedure. CT was performed in a supine position. The right and left abdominal walls were prepped with Betadine in a sterile fashion, and a sterile drape was applied covering the operative field. A sterile gown and sterile gloves were used for the procedure. Local anesthesia was provided with 1% Lidocaine. Under CT guidance, a 18 gauge needle was advanced into a right upper quadrant perihepatic abscess. Aspiration of fluid was performed and a sample sent for culture analysis. A guidewire was advanced into the collection. A 10 French percutaneous drainage catheter was then advanced over the wire. Catheter positioning was confirmed by CT. Under CT guidance, an 18  gauge needle was advanced into a left upper quadrant abscess. A fluid sample was aspirated and sent for culture analysis. A guidewire was advanced. A 10 French percutaneous drainage catheter was advanced over the wire. Catheter positioning was confirmed by CT. Under CT guidance, an 18 gauge needle was advanced into a right lower quadrant abscess. Fluid was aspirated and a sample sent for culture analysis. A guidewire was placed. The tract was dilated and a 10 French percutaneous drainage catheter placed. Catheter positioning was confirmed by CT. Under CT guidance, a 5 [place] Yueh centesis catheter was inserted into abscess fluid localizing to the left pelvis. After confirming catheter positioning by CT, manual aspiration drainage was performed with syringes. The drainage catheter was slowly retracted. The catheter was then removed. The 3 percutaneous drains were secured with Prolene retention sutures and StatLock devices. All 3 drains were connected to suction bulbs. COMPLICATIONS: None FINDINGS: The right perihepatic collection yielded bloody fluid. The other collections yielded yellowish and slightly turbid fluid. The left  pelvic fluid was the least turbid and appeared quite clear. 60 mL of fluid was able to be aspirated from a pocket in the left pelvis with no further return. An indwelling drain was not left in place in this region. IMPRESSION: CT-guided catheter drainage of 4 separate fluid collections in the peritoneal cavity. 10 French drains were placed in a right upper quadrant perihepatic abscess which yielded bloody fluid, a left upper quadrant abscess yielding yellowish turbid fluid and a right lower quadrant abscess also yielding slightly turbid yellowish fluid. A left pelvic fluid collection was drained with a 5 French centesis catheter yielding 60 mL of serous fluid. A drain was not left in place in this collection. Electronically Signed   By: Aletta Edouard M.D.   On: 12/08/2015 15:44     Microbiology: Recent Results (from the past 240 hour(s))  Culture, routine-abscess     Status: None   Collection Time: 12/27/15  3:00 PM  Result Value Ref Range Status   Specimen Description ABDOMEN LEFT  Final   Special Requests NONE  Final   Gram Stain   Final    ABUNDANT WBC PRESENT,BOTH PMN AND MONONUCLEAR NO SQUAMOUS EPITHELIAL CELLS SEEN NO ORGANISMS SEEN Performed at [facility]    Culture   Final    NO GROWTH 2 DAYS Performed at [facility]    Report Status 12/29/2015 FINAL  Final  Culture, routine-abscess     Status: None   Collection Time: 12/27/15  3:01 PM  Result Value Ref Range Status   Specimen Description ABDOMEN RIGHT  Final   Special Requests NONE  Final   Gram Stain   Final    MODERATE WBC PRESENT,BOTH PMN AND MONONUCLEAR NO SQUAMOUS EPITHELIAL CELLS SEEN NO ORGANISMS SEEN Performed at [facility]    Culture   Final    NO GROWTH 2 DAYS Performed at [facility]    Report Status 12/29/2015 FINAL  Final     Labs: Basic Metabolic Panel:  Recent Labs Lab 12/23/15 0320 12/24/15 0516 12/25/15 0450 12/27/15 0415 12/27/15 0940 12/28/15 0620 12/29/15 0502  NA 133*  --  135  --   --  131* 130*  K 4.5  --  5.1  --  4.8 4.6 4.8  CL 93*  --  95*  --   --  92* 92*  CO2 34*  --  32  --   --  32 33*  GLUCOSE 167*  --  157*  --   --  169* 163*  BUN 18  --  20  --   --  18 17  CREATININE 0.71  --  0.73  --   --  0.65 0.63  CALCIUM 8.0*  --  8.9  --   --  9.1 8.6*  MG 1.8 1.5* 1.6* 1.7  --  1.6* 1.7  PHOS 3.3  --  3.3  --   --  3.4  --    Liver Function Tests:  Recent Labs Lab 12/25/15 0450 12/28/15 0620  AST 21 26  ALT 12* 17  ALKPHOS 118 144*  BILITOT 0.3 0.4  PROT 7.1 7.3  ALBUMIN 1.9* 2.0*   No results for input(s): LIPASE, AMYLASE in the last 168 hours. No results for input(s): AMMONIA in the last 168 hours. CBC:  Recent Labs Lab 12/23/15 0320 12/24/15 0516 12/25/15 0450 12/26/15 0415  12/29/15 0502  WBC 11.6* 11.6* 14.1* 12.5* 12.0*  NEUTROABS  --   --  11.3*  --   --  HGB 8.0* 8.4* 7.8* 8.0* 7.6*  HCT 25.5* 26.9* 25.4* 25.8* 24.7*  MCV 97.0 96.8 97.3 97.4 95.7  PLT PLATELET CLUMPS NOTED ON SMEAR, COUNT APPEARS ADEQUATE 236 459* 226 315   Cardiac Enzymes: No results for input(s): CKTOTAL, CKMB, CKMBINDEX, TROPONINI in the last 168 hours. BNP: BNP (last 3 results) No results for input(s): BNP in the last 8760 hours.  ProBNP (last 3 results) No results for input(s): PROBNP in the last 8760 hours.  CBG:  Recent Labs Lab 12/28/15 1142 12/28/15 1704 12/28/15 2054 12/29/15 0724 12/29/15 1131  GLUCAP 161* 137* 134* 148* 110*

## 2015-12-29 NOTE — Clinical Social Work Placement (Signed)
Patient has a bed at [facility]. CSW has completed FL2 & will continue to follow and assist with discharge when ready.   CSW confirmed with Tammy at [facility] that they would be able to take patient over the weekend.  Please call weekend CSW (ph#: [phone]) to facilitate discharge.     Raynaldo Opitz, LCSW [facility] Clinical Social Worker   CLINICAL SOCIAL WORK PLACEMENT  NOTE  Date:  12/29/2015  Patient Details  Name: [name] [name] MRN: [id number] Date of Birth: 1951/04/15  Clinical Social Work is seeking post-discharge placement for this patient at the [place] level of care (*CSW will initial, date and re-position this form in  chart as items are completed):  Yes   Patient/family provided with [place] Work Department's list of facilities offering this level of care within the geographic area requested by the patient (or if unable, by the patient's family).  Yes   Patient/family informed of their freedom to choose among providers that offer the needed level of care, that participate in Medicare, Medicaid or managed care program needed by the patient, have an available bed and are willing to accept the patient.  Yes   Patient/family informed of [facility]'s ownership interest in [facility] and [facility], as well as of the fact that they are under no obligation to receive care at these facilities.  PASRR submitted to EDS on 12/29/15     PASRR number received on 12/29/15     Existing PASRR number confirmed on       FL2 transmitted to all facilities in geographic area requested by pt/family on 12/29/15     FL2 transmitted to all facilities within larger geographic area on       Patient informed that his/her managed care company has contracts with or will negotiate with certain facilities, including the following:        Yes   Patient/family informed of bed offers received.  Patient  chooses bed at [facility]     Physician recommends and patient chooses bed at      Patient to be transferred to [facility] on  .  Patient to be transferred to facility by       Patient family notified on   of transfer.  Name of family member notified:        PHYSICIAN       Additional Comment:    _______________________________________________ Standley Brooking, LCSW 12/29/2015, 2:54 PM

## 2015-12-29 NOTE — Progress Notes (Signed)
Physical Therapy Treatment Patient Details Name: [name] [name] MRN: [id number] DOB: 1951/09/04 Today's Date: 12/29/2015    History of Present Illness 65 year old aam who was admitted on 12/27 w/ abd pain found to be 2/2 SBO on CT scan. Marland Kitchen His hospital course was complicated by fever spike and delirium on 1/5. At that time has abd was more distended.  He went for exploratory lap 12/01/15 which was notable for significant peritoneal contamination. He is s/p exploration, washout and repair of perforated viscous    PT Comments    Patient appeared to  Initially enjoy getting into [facility] and getting Out of his room. Rolled down to 3rd floor to see murel. Patient did self propel x 60'  Very fatigued, asking to be pushed back  and asked for oxygen. C/o abdominal pain, withdrew from activity after return to the bed.  Follow Up Recommendations  SNF;Supervision/Assistance - 24 hour     Equipment Recommendations  Wheelchair (measurements PT);Wheelchair cushion (measurements PT)    Recommendations for Other Services       Precautions / Restrictions Precautions Precautions: Fall Precaution Comments: profound LE weakness, abd  drains on right  and left back    Mobility  Bed Mobility         Supine to sit: Mod assist Sit to supine: Min assist   General bed mobility comments: assist to get legs off bed and to lift trunk upright with cues and increased time, placed left leg onto bed and patient placed right  after transfer to bed from [facility]  Transfers Overall transfer level: Needs assistance   Transfers: Lateral/Scoot Transfers          Lateral/Scoot Transfers: Min assist General transfer comment: WC set up beside bed with armrest removed. patient scooted to [facility] wth min assist. then transferred back to the bed. cues for technique.  Ambulation/Gait                 Hotel manager mobility: Yes Wheelchair propulsion: Both  upper extremities Wheelchair parts: Supervision/cueing Distance: 60', then c/o feeling dizzy, sats 85% on RA. returned to room and placed on O2 sats 96% Wheelchair Assistance Details (indicate cue type and reason): total assist for set up, parking and locking(brakes behind- a highback WC).  Modified Rankin (Stroke Patients Only)       Balance                                    Cognition Arousal/Alertness: Awake/alert Behavior During Therapy: Flat affect                        Exercises      General Comments        Pertinent Vitals/Pain Faces Pain Scale: Hurts whole lot Pain Location: abd Pain Descriptors / Indicators: Discomfort;Grimacing;Guarding Pain Intervention(s): Repositioned;Premedicated before session;Patient requesting pain meds-RN notified    Home Living                      Prior Function            PT Goals (current goals can now be found in the care plan section) Progress towards PT goals: Progressing toward goals    Frequency  Min 2X/week    PT Plan Current plan remains appropriate  Co-evaluation             End of Session   Activity Tolerance: Patient limited by fatigue;Patient limited by pain Patient left: in bed;with call bell/phone within reach;with bed alarm set     Time: 1543-1630 PT Time Calculation (min) (ACUTE ONLY): 47 min  Charges:  $Therapeutic Activity: 23-37 mins $Wheel Chair Management: 8-22 mins                    G Codes:      Claretha Cooper 12/29/2015, 5:20 PM Tresa Endo PT [phone]

## 2015-12-30 LAB — BASIC METABOLIC PANEL
ANION GAP: 5 (ref 5–15)
BUN: 18 mg/dL (ref 6–20)
CHLORIDE: 94 mmol/L — AB (ref 101–111)
CO2: 32 mmol/L (ref 22–32)
Calcium: 8.7 mg/dL — ABNORMAL LOW (ref 8.9–10.3)
Creatinine, Ser: 0.64 mg/dL (ref 0.61–1.24)
GFR calc non Af Amer: >60 mL/min (ref >60)
Glucose, Bld: 179 mg/dL — ABNORMAL HIGH (ref 65–99)
POTASSIUM: 4.7 mmol/L (ref 3.5–5.1)
SODIUM: 131 mmol/L — AB (ref 135–145)

## 2015-12-30 LAB — CBC
HEMATOCRIT: 24.3 % — AB (ref 39.0–52.0)
HEMOGLOBIN: 7.7 g/dL — AB (ref 13.0–17.0)
MCH: 30.2 pg (ref 26.0–34.0)
MCHC: 31.7 g/dL (ref 30.0–36.0)
MCV: 95.3 fL (ref 78.0–100.0)
Platelets: 332 10*3/uL (ref 150–400)
RBC: 2.55 MIL/uL — AB (ref 4.22–5.81)
RDW: 15.9 % — ABNORMAL HIGH (ref 11.5–15.5)
WBC: 10.8 10*3/uL — ABNORMAL HIGH (ref 4.0–10.5)

## 2015-12-30 LAB — GLUCOSE, CAPILLARY: GLUCOSE-CAPILLARY: 201 mg/dL — AB (ref 65–99)

## 2015-12-30 NOTE — Progress Notes (Signed)
Pt stable for discharge to SNF today Please refer to discharge summary completed 12/29/2015 No changes in medical management since 12/29/2015  Leisa Lenz [facility] [id number]

## 2015-12-30 NOTE — Clinical Social Work Placement (Signed)
   CLINICAL SOCIAL WORK PLACEMENT  NOTE  Date:  12/30/2015  Patient Details  Name: [name] [name] MRN: [id number] Date of Birth: [birth year]  Clinical Social Work is seeking post-discharge placement for this patient at the [place] level of care (*CSW will initial, date and re-position this form in  chart as items are completed):  Yes   Patient/family provided with [place] Work Department's list of facilities offering this level of care within the geographic area requested by the patient (or if unable, by the patient's family).  Yes   Patient/family informed of their freedom to choose among providers that offer the needed level of care, that participate in Medicare, Medicaid or managed care program needed by the patient, have an available bed and are willing to accept the patient.  Yes   Patient/family informed of [facility]'s ownership interest in [facility] and [facility], as well as of the fact that they are under no obligation to receive care at these facilities.  PASRR submitted to EDS on 12/29/15     PASRR number received on 12/29/15     Existing PASRR number confirmed on       FL2 transmitted to all facilities in geographic area requested by pt/family on 12/29/15     FL2 transmitted to all facilities within larger geographic area on       Patient informed that his/her managed care company has contracts with or will negotiate with certain facilities, including the following:        Yes   Patient/family informed of bed offers received.  Patient chooses bed at [place]     Physician recommends and patient chooses bed at      Patient to be transferred to [facility] on 12/30/15.  Patient to be transferred to facility by PTAR     Patient family notified on 12/30/15 of transfer.  Name of family member notified:  Pt will alert family of DC     PHYSICIAN       Additional Comment:     _______________________________________________ Boone Master, [place] 12/30/2015, 10:14 AM

## 2015-12-30 NOTE — Progress Notes (Signed)
Patient ID: [name] [name], male   DOB: [birth year], 65 y.o.   MRN: [id number]  [facility]  Subjective: Patient alert and responsive.  No complaints.  Anticipating transfer to [facility] SNF today.  Objective: Vital signs in last 24 hours: Temp:  [98.5 F (36.9 C)-99.2 F (37.3 C)] 99 F (37.2 C) (02/04 0438) Pulse Rate:  [89-94] 92 (02/04 0438) Resp:  [18-20] 18 (02/04 0438) BP: (96-125)/(75-80) 123/75 mmHg (02/04 0438) SpO2:  [91 %-100 %] 91 % (02/04 0438) Last BM Date: 12/28/15  Intake/Output from previous day: 02/03 0701 - 02/04 0700 In: 180 [P.O.:120] Out: 870 [Urine:750; Drains:120] Intake/Output this shift:    Physical Exam: HEENT - sclerae clear, mucous membranes moist Neck - IJ on right Abdomen - midline dressing dry and intact; bilateral drains with clear, serous small output Ext - no edema, non-tender Neuro - alert & oriented, no focal deficits  Lab Results:   Recent Labs  12/29/15 0502 12/30/15 0614  WBC 12.0* 10.8*  HGB 7.6* 7.7*  HCT 24.7* 24.3*  PLT 315 332   BMET  Recent Labs  12/29/15 0502 12/30/15 0614  NA 130* 131*  K 4.8 4.7  CL 92* 94*  CO2 33* 32  GLUCOSE 163* 179*  BUN 17 18  CREATININE 0.63 0.64  CALCIUM 8.6* 8.7*   PT/INR No results for input(s): LABPROT, INR in the last 72 hours. Comprehensive Metabolic Panel:    Component Value Date/Time   NA 131* 12/30/2015 0614   NA 130* 12/29/2015 0502   K 4.7 12/30/2015 0614   K 4.8 12/29/2015 0502   CL 94* 12/30/2015 0614   CL 92* 12/29/2015 0502   CO2 32 12/30/2015 0614   CO2 33* 12/29/2015 0502   BUN 18 12/30/2015 0614   BUN 17 12/29/2015 0502   CREATININE 0.64 12/30/2015 0614   CREATININE 0.63 12/29/2015 0502   GLUCOSE 179* 12/30/2015 0614   GLUCOSE 163* 12/29/2015 0502   CALCIUM 8.7* 12/30/2015 0614   CALCIUM 8.6* 12/29/2015 0502   AST 26 12/28/2015 0620   AST 21 12/25/2015 0450   ALT 17 12/28/2015 0620   ALT 12* 12/25/2015 0450    ALKPHOS 144* 12/28/2015 0620   ALKPHOS 118 12/25/2015 0450   BILITOT 0.4 12/28/2015 0620   BILITOT 0.3 12/25/2015 0450   PROT 7.3 12/28/2015 0620   PROT 7.1 12/25/2015 0450   ALBUMIN 2.0* 12/28/2015 0620   ALBUMIN 1.9* 12/25/2015 0450    Studies/Results: No results found.  Assessment & Plans: s/p Procedure(s): EXPLORATORY LAPAROTOMY WITH RADICAL PERITONEAL DEBRIDEMENT, CLOSURE OF SMALL BOWEL PERFORATION AND LYSIS OF ADHESIONS  Regular diet  Will need follow up at IR drain clinic for evaluation, drain removal - to be arranged  Will need follow up at [place] office for wound check - to be arranged  OK from surgical standpoint for transfer to SNF/Rehab today.  Earnstine Regal, MD, [facility] Office: [place] 12/30/2015

## 2015-12-30 NOTE — Progress Notes (Signed)
Clinical Social Work  CSW faxed DC summary and FL2 to [employer] for DC. CSW spoke with admissions coordinator (Tammy) who is agreeable to accept pt today.  CSW alerted pt of DC and he reports he will inform family. CSW prepared PTAR paperwork and left on chart with hard scripts. RN to call report to [phone].  CSW is signing off but available if needed.  Sindy Messing, LCSW Weekend Coverage

## 2016-01-01 ENCOUNTER — Non-Acute Institutional Stay (SKILLED_NURSING_FACILITY): Payer: Medicaid Other | Admitting: Internal Medicine

## 2016-01-01 ENCOUNTER — Other Ambulatory Visit: Payer: Self-pay | Admitting: General Surgery

## 2016-01-01 ENCOUNTER — Encounter: Payer: Self-pay | Admitting: Internal Medicine

## 2016-01-01 DIAGNOSIS — K650 Generalized (acute) peritonitis: Secondary | ICD-10-CM

## 2016-01-01 DIAGNOSIS — D62 Acute posthemorrhagic anemia: Secondary | ICD-10-CM

## 2016-01-01 DIAGNOSIS — K631 Perforation of intestine (nontraumatic): Secondary | ICD-10-CM

## 2016-01-01 DIAGNOSIS — J9601 Acute respiratory failure with hypoxia: Secondary | ICD-10-CM | POA: Diagnosis not present

## 2016-01-01 DIAGNOSIS — N179 Acute kidney failure, unspecified: Secondary | ICD-10-CM

## 2016-01-01 DIAGNOSIS — K651 Peritoneal abscess: Secondary | ICD-10-CM

## 2016-01-01 DIAGNOSIS — E1159 Type 2 diabetes mellitus with other circulatory complications: Secondary | ICD-10-CM | POA: Diagnosis not present

## 2016-01-01 DIAGNOSIS — G9340 Encephalopathy, unspecified: Secondary | ICD-10-CM | POA: Diagnosis not present

## 2016-01-01 DIAGNOSIS — I808 Phlebitis and thrombophlebitis of other sites: Secondary | ICD-10-CM | POA: Diagnosis not present

## 2016-01-01 DIAGNOSIS — E43 Unspecified severe protein-calorie malnutrition: Secondary | ICD-10-CM

## 2016-01-01 DIAGNOSIS — G629 Polyneuropathy, unspecified: Secondary | ICD-10-CM

## 2016-01-01 DIAGNOSIS — D696 Thrombocytopenia, unspecified: Secondary | ICD-10-CM | POA: Diagnosis not present

## 2016-01-01 DIAGNOSIS — A419 Sepsis, unspecified organism: Secondary | ICD-10-CM | POA: Diagnosis not present

## 2016-01-01 DIAGNOSIS — I82A11 Acute embolism and thrombosis of right axillary vein: Secondary | ICD-10-CM

## 2016-01-01 NOTE — Progress Notes (Signed)
MRN: [id number] Name: [name] [name]  Sex: male Age: 65 y.o. DOB: 01/14/1951  [place] #: Karren Burly Facility/Room:107 Level Of Care: SNF Provider: Inocencio Homes D Emergency Contacts: Extended Emergency Contact Information Primary Emergency Contact: Lance Coon [place] of [place] Phone: [phone] Relation: Brother Secondary Emergency Contact: Linton Ham Address: [address]          [place],  [address] Johnnette Litter of [place] Phone: [phone] Relation: Friend  Code Status:   Allergies: Zetia; Atorvastatin; Penicillins; Pravastatin sodium; Lisinopril; and Promethazine  Chief Complaint  Patient presents with  . New Admit To SNF    HPI: Patient is 65 y.o. male whohyperlipidemia, hypertension, CAD, type 2 diabetes, diabetic peripheral neuropathy, seizure disorder, polysubstance abuse and chronic pain issues (on high doses of oxycodone prior to admission) who was admitted 11/22/15 with a small bowel obstruction. Pt was admitted to [facility] from 12/27-12/30/2015 where he underwent surgery on 1/6/2017because he deteriorated under conservative managnment and was discovered to have a perforated viscus. Pt's hospital course was complicated by non compliance, need for TPN, RUE DVT and sepsis and acute respiratory failure with hypoxemia. Pt is admitted to [facility] with generalized weakment and for support for surgical drains. While at SNF pt will be followed for DM2, tx with insulin, eczema, tx with triamcinolone cream and neuropathy tx with oxycodone.  History reviewed. No pertinent past medical history.  Past Surgical History  Procedure Laterality Date  . Iliac artery stent  05/06/2011  . Tonsillectomy    . Aorta - bilateral femoral artery bypass graft  11/25/2011    Procedure: AORTA BIFEMORAL BYPASS GRAFT;  Surgeon: Rosetta Posner, MD;  Location: [facility];  Service: Vascular;  Laterality: N/A;  . Pr vein bypass graft,aorto-fem-pop  11/24/2012   . Inguinal hernia repair Right   . Abdominal aortagram N/A 11/11/2011    Procedure: ABDOMINAL Maxcine Ham;  Surgeon: Angelia Mould, MD;  Location: [facility] CATH LAB;  Service: Cardiovascular;  Laterality: N/A;  . Lower extremity angiogram Bilateral 11/11/2011    Procedure: LOWER EXTREMITY ANGIOGRAM;  Surgeon: Angelia Mould, MD;  Location[facility] C[facility] LAB;  Service: Cardiovascular;  Laterality: Bilateral;  . Left heart catheterization with coronary angiogram N/A 06/07/2013    Procedure: LEFT HEART CATHETERIZATION WITH CORONARY ANGIOGRAM;  Surgeon: Sherren Mocha, MD;  Location: [facility] CATH LAB;  Service: Cardiovascular;  Laterality: N/A;  . Percutaneous coronary stent intervention (pci-s) Right 06/07/2013    Procedure: PERCUTANEOUS CORONARY STENT INTERVENTION (PCI-S);  Surgeon: Sherren Mocha, MD;  Location: [facility] CATH LAB;  Service: Cardiovascular;  Laterality: Right;  . Left heart catheterization with coronary angiogram N/A 12/10/2013    Procedure: LEFT HEART CATHETERIZATION WITH CORONARY ANGIOGRAM;  Surgeon: Blane Ohara, MD;  Location: [facility] CATH LAB;  Service: Cardiovascular;  Laterality: N/A;  . Colonoscopy    . Peripheral vascular catheterization N/A 07/26/2015    Procedure: Abdominal Aortogram;  Surgeon: Serafina Mitchell, MD;  Location: [place] CV LAB;  Service: Cardiovascular;  Laterality: N/A;  . Peripheral vascular catheterization Bilateral 07/26/2015    Procedure: Lower Extremity Angiography;  Surgeon: Serafina Mitchell, MD;  Location: [place] CV LAB;  Service: Cardiovascular;  Laterality: Bilateral;  . Femoral-popliteal bypass graft Left 07/28/2015    Procedure: LEFT FEMORAL-POPLITEAL ARTERY BYPASS GRAFT;  Surgeon: Rosetta Posner, MD;  Location: [place];  Service: Vascular;  Laterality: Left;  . Laparotomy N/A 12/01/2015    Procedure: EXPLORATORY LAPAROTOMY WITH RADICAL PERITONEAL DEBRIDEMENT,  CLOSURE OF SMALL BOWEL PERFORATION AND LYSIS OF ADHESIONS.;  Surgeon: Johnathan Hausen, MD;  Location:  [facility];  Service: General;  Laterality: N/A;      Medication List       This list is accurate as of: 01/01/16 11:59 PM.  Always use your most recent med list.               acetaminophen 325 MG tablet  Commonly known as:  TYLENOL  Take 2 tablets (650 mg total) by mouth every 6 (six) hours as needed for fever.     albuterol 108 (90 Base) MCG/ACT inhaler  Commonly known as:  PROVENTIL HFA;VENTOLIN HFA  Inhale 2 puffs into the lungs every 6 (six) hours as needed for wheezing or shortness of breath.     alum & mag hydroxide-simeth 200-200-20 MG/5ML suspension  Commonly known as:  MAALOX/MYLANTA  Take 30 mLs by mouth every 6 (six) hours as needed for indigestion or heartburn (or bloating).     antiseptic oral rinse 0.05 % Liqd solution  Commonly known as:  CPC / CETYLPYRIDINIUM CHLORIDE 0.05%  7 mLs by Mouth Rinse route 2 times daily at 12 noon and 4 pm.     bacitracin ointment  Apply topically daily.     bisacodyl 10 MG suppository  Commonly known as:  DULCOLAX  Place 1 suppository (10 mg total) rectally every 12 (twelve) hours as needed for mild constipation or moderate constipation.     collagenase ointment  Commonly known as:  SANTYL  Apply thin layer to wound bed of left foot daily     diphenhydrAMINE 25 mg capsule  Commonly known as:  BENADRYL  Take 1 capsule (25 mg total) by mouth every 6 (six) hours as needed for itching, allergies or sleep (anxiety).     feeding supplement (ENSURE ENLIVE) Liqd  Take 237 mLs by mouth 3 (three) times daily between meals.     insulin aspart 100 UNIT/ML injection  Commonly known as:  novoLOG  Inject 0-15 Units into the skin 3 (three) times daily with meals.     magnesium oxide 400 (241.3 Mg) MG tablet  Commonly known as:  MAG-OX  Take 1 tablet (400 mg total) by mouth 2 (two) times daily.     methocarbamol 500 MG tablet  Commonly known as:  ROBAXIN  Take 2 tablets (1,000 mg total) by mouth 3 (three) times daily.      multivitamin with minerals tablet  Take 4 tablets by mouth 3 (three) times daily.     Oxycodone HCl 20 MG Tabs  Take 1 tablet (20 mg total) by mouth every 4 (four) hours as needed for moderate pain.     thiamine 100 MG tablet  Take 1 tablet (100 mg total) by mouth daily.     triamcinolone cream 0.1 %  Commonly known as:  KENALOG  Apply 1 application topically 2 (two) times daily as needed (for eczema).        No orders of the defined types were placed in this encounter.    Immunization History  Administered Date(s) Administered  . Influenza,inj,Quad PF,36+ Mos 11/11/2013  . Pneumococcal Polysaccharide-23 11/12/2011    Social History  Substance Use Topics  . Smoking status: Current Every Day Smoker -- 0.50 packs/day for 46 years    Types: Cigarettes  . Smokeless tobacco: Never Used  . Alcohol Use: 0.0 oz/week    0 Standard drinks or equivalent per week     Comment: [id number]/17/20214 "last drink  was 3-4 yr ago; never had problem w/it"    Family history is  +DM2, HTN, MI  Review of Systems  DATA OBTAINED: from patient, nurse GENERAL:  no fevers, fatigue, appetite changes SKIN: No itching, rash or wounds EYES: No eye pain, redness, discharge EARS: No earache, tinnitus, change in hearing NOSE: No congestion, drainage or bleeding  MOUTH/THROAT: No mouth or tooth pain, No sore throat RESPIRATORY: No cough, wheezing, SOB CARDIAC: No chest pain, palpitations, lower extremity edema  GI: No abdominal pain, No N/V/D or constipation, No heartburn or reflux  GU: No dysuria, frequency or urgency, or incontinence  MUSCULOSKELETAL: No unrelieved bone/joint pain NEUROLOGIC: No headache, dizziness or focal weakness PSYCHIATRIC: No c/o anxiety or sadness   Filed Vitals:   01/06/16 1622  BP: 136/82  Pulse: 70  Temp: 98 F (36.7 C)  Resp: 18    SpO2 Readings from Last 1 Encounters:  12/30/15 91%        Physical Exam  GENERAL APPEARANCE: Alert,  No acute distress.  SKIN:  No diaphoresis rash HEAD: Normocephalic, atraumatic  EYES: Conjunctiva/lids clear. Pupils round, reactive. EOMs intact.  EARS: External exam WNL, canals clear. Hearing grossly normal.  NOSE: No deformity or discharge.  MOUTH/THROAT: Lips w/o lesions  RESPIRATORY: Breathing is even, unlabored. Lung sounds are clear   CARDIOVASCULAR: Heart RRR no murmurs, rubs or gallops. No peripheral edema.   GASTROINTESTINAL: Abdomen is soft, non-tender, not distended w/ normal bowel sounds;drains in place GENITOURINARY: Bladder non tender, not distended  MUSCULOSKELETAL: No abnormal joints or musculature NEUROLOGIC:  Cranial nerves 2-12 grossly intact. Moves all extremities  PSYCHIATRIC: Mood and affect appropriate to situation, no behavioral issues  Patient Active Problem List   Diagnosis Date Noted  . Acute blood loss anemia 01/06/2016  . Hypomagnesemia 12/16/2015  . Demand ischemia on 1/20 from acute blood loss anemia ([place]) 12/16/2015  . Abdominal wall fluid collections   . Acute encephalopathy 12/12/2015  . Seizures ([place]) 12/12/2015  . Acute kidney injury ([place]) 12/12/2015  . Controlled diabetes mellitus with circulatory complication, without long-term current use of insulin ([place]) 12/12/2015  . Anemia of chronic disease 12/12/2015  . Sepsis ([place]) 12/12/2015  . Leukocytosis 12/12/2015  . Thrombocytopenia ([place]) 12/12/2015  . Palliative care encounter   . Acute respiratory failure with hypoxemia ([place]) 12/06/2015  . DVT of axillary vein, acute right   . Small bowel obstruction & perforation s/p OR exploration & repair 12/01/2015 12/02/2015  . Purulent peritonitis ([place]) 12/02/2015  . Protein-calorie malnutrition, severe ([place]) 07/30/2015    CBC    Component Value Date/Time   WBC 10.8* 12/30/2015 0614   WBC 5.9 07/28/2009 1533   RBC 2.55* 12/30/2015 0614   RBC 3.98* 07/28/2009 1533   HGB 7.7* 12/30/2015 0614   HGB 12.5* 07/28/2009 1533   HCT 24.3* 12/30/2015 0614   HCT 37.1* 07/28/2009 1533    PLT 332 12/30/2015 0614   PLT 145 07/28/2009 1533   MCV 95.3 12/30/2015 0614   MCV 93.3 07/28/2009 1533   LYMPHSABS 1.2 12/25/2015 0450   LYMPHSABS 2.4 07/28/2009 1533   MONOABS 1.5* 12/25/2015 0450   MONOABS 0.4 07/28/2009 1533   EOSABS 0.1 12/25/2015 0450   EOSABS 0.2 07/28/2009 1533   BASOSABS 0.1 12/25/2015 0450   BASOSABS 0.1 07/28/2009 1533    CMP     Component Value Date/Time   NA 131* 12/30/2015 0614   K 4.7 12/30/2015 0614   CL 94* 12/30/2015 0614   CO2 32 12/30/2015 [id number]  GLUCOSE 179* 12/30/2015 0614   BUN 18 12/30/2015 0614   CREATININE 0.64 12/30/2015 0614   CALCIUM 8.7* 12/30/2015 0614   PROT 7.3 12/28/2015 0620   ALBUMIN 2.0* 12/28/2015 0620   AST 26 12/28/2015 0620   ALT 17 12/28/2015 0620   ALKPHOS 144* 12/28/2015 0620   BILITOT 0.4 12/28/2015 0620   GFRNONAA >60 12/30/2015 0614   GFRAA >60 12/30/2015 0614    Lab Results  Component Value Date   HGBA1C 6.9* 11/23/2015     Dg Chest Port 1 View  12/01/2015  CLINICAL DATA:  Endotracheal tube placement ; sepsis, encephalopathy, coronary artery disease, current smoker. EXAM: PORTABLE CHEST 1 VIEW COMPARISON:  Portable chest x-ray of November 30, 2015 FINDINGS: There is been interval intubation of the trachea. The tip of the tube lies approximately 6.4 cm above the carina. There bibasilar infiltrates. The heart is normal in size. The pulmonary vascularity is not engorged. The esophagogastric tube tip projects below the inferior margin of the image. The right-sided PICC line tip projects over the proximal SVC. IMPRESSION: 1. Interval intubation of the trachea with reasonable positioning of the endotracheal tube. The other support tubes are in reasonable position. 2. Bibasilar alveolar opacities consistent with pneumonia. Electronically Signed   By: David  Martinique M.D.   On: 12/01/2015 16:14   Dg Chest Port 1 View  11/30/2015  CLINICAL DATA:  PICC placement. Systemic inflammatory response syndrome. Altered mental  status. EXAM: PORTABLE CHEST 1 VIEW COMPARISON:  Single view of the chest 11/21/2015. PA and lateral chest 01/17/2015. FINDINGS: NG tube is in place and courses into the stomach and below the inferior margin of the film. Right side PICC is also identified with the tip projecting in the lower superior vena cava. Lung volumes are lower than on the comparison studies with basilar atelectasis. No pneumothorax or pleural effusion. Heart size is normal. IMPRESSION: NG tube courses into the stomach and below the inferior margin of film. PICC projects in good position. Bibasilar atelectasis in a low volume chest. Electronically Signed   By: Inge Rise M.D.   On: 11/30/2015 07:25   Dg Abd 2 Views  12/01/2015  CLINICAL DATA:  Follow up small bowel obstruction. EXAM: ABDOMEN - 2 VIEW COMPARISON:  Multiple prior radiographs obtained over the last week. FINDINGS: The nasogastric tube appears unchanged, terminating in the mid abdomen. Small bowel distention in the upper abdomen is unchanged. There is some gas within the colon. There is no free intraperitoneal air on the decubitus view. Vascular stents are noted. IMPRESSION: No significant change in residual partial small bowel obstruction compared with recent priors. The degree of bowel distention is improved from the studies done last week. Electronically Signed   By: Richardean Sale M.D.   On: 12/01/2015 08:44   Dg Abd Portable 1v  11/30/2015  CLINICAL DATA:  Nasogastric tube placement.  Initial encounter. EXAM: PORTABLE ABDOMEN - 1 VIEW COMPARISON:  Abdominal radiograph performed 11/29/2015 FINDINGS: The patient's nasogastric tube is noted ending overlying the body of the stomach. There is increased dilatation of small bowel loops, measuring up to 4.8 cm in diameter, concerning for worsening small bowel obstruction. No free intra-abdominal air is seen, though evaluation free air is limited on a single supine view. No acute osseous abnormalities are identified.  IMPRESSION: 1. Nasogastric tube noted ending overlying the body of the stomach. 2. Diffuse dilatation of small-bowel loops, measuring up to 4.8 cm in diameter, concerning for worsening small bowel obstruction. No free intra-abdominal air  seen. Electronically Signed   By: Garald Balding M.D.   On: 11/30/2015 02:35    Not all labs, radiology exams or other studies done during hospitalization come through on my EPIC note; however they are reviewed by me.    Assessment and Plan  Sepsis ([place]) Principal problem: Sepsis secondary to SBO w/ perforated viscous and resultant peritonitis / Postoperative ileus / leukocytosis  - S/p abdominal fluid collection drainages 1/13 with removal of RLQ drain 1/20, repositioning of LUQ drain 1/20 and placement of new right subphrenic drain 1/20 - S/P removal of RLQ drain 1/20, repositioning of LUQ drain 1/20 and placement of new right subphrenic drain 1/20 - CT scan repeated 12/25/2015 and per surgery drains should remain in place. - Patient has completed appropriate course of antibiotics. Please refer to anti-infectives section on antibiotics during this hospital stay.   Small bowel obstruction & perforation s/p OR exploration & repair 12/01/2015 2/2 perferated viscus; SNF - will be admitted with darains; will follow  DVT of axillary vein, acute right US DVT right distal axillary vein, subclavian vein.  - Stopped heparin drip due to drop in Hg 1/23.  - Pt transfused two units PRBC 1/23, one unit 1/24 - IVC filters not placed in arm - Hemoglobin 12/26/2015 was 8. Repeat CBC tomorrow morning. SNF - f/u CBCs; pt not on AC    Acute blood loss anemia Stopped heparin drip due to drop in Hg 1/23.  - Pt transfused two units PRBC 1/23, one unit 1/24 -Hemoglobin 12/26/2015 was 8. Repeat CBC tomorrow morning. SNF - will follow CBC  Acute respiratory failure with hypoxemia [facility]) Patient required ventilatory support after exploratory laparotomy 12/01/2015.  -  Patient self extubated 12/02/2015 - Patient has received Lasix 80 mg IV on 12/05/2015 but no Lasix since then.  Acute encephalopathy EEG done 11/30/2015 showed no acute seizures - If seizures occur, use Keppra   Protein-calorie malnutrition, severe (HCC) In the context of chronic illness, small bowel obstruction with peritonitis - Please note patient malnourished because of poor by mouth intake however based on BMI he's not under weight. His BMI is 20. SNF - Diet as tolerated with protein supplements  Purulent peritonitis ([place]) 2/2 perforated viscous;has finished abx tx; admitted to SNF with surgical drains  Hypomagnesemia SNF - cont supplementation  Acute kidney injury ([place]) In the setting of sepsis and small bowel obstruction with perforated viscus - Creatinine normalized with fluids SNF - will follow with BMP  Thrombocytopenia ([place]) SNF - may be 2/2 heparin; will follow with CBC  Controlled diabetes mellitus with circulatory complication, without long-term current use of insulin (HCC) SNF A1c 6.9; will cont SSI   Time spent > 45 min;> 50% of time with patient was spent reviewing records, labs, tests and studies, counseling and developing plan of care  Hennie Duos, MD

## 2016-01-06 ENCOUNTER — Encounter: Payer: Self-pay | Admitting: Internal Medicine

## 2016-01-06 DIAGNOSIS — G629 Polyneuropathy, unspecified: Secondary | ICD-10-CM | POA: Insufficient documentation

## 2016-01-06 DIAGNOSIS — D62 Acute posthemorrhagic anemia: Secondary | ICD-10-CM | POA: Insufficient documentation

## 2016-01-06 NOTE — Assessment & Plan Note (Addendum)
US DVT right distal axillary vein, subclavian vein.  - Stopped heparin drip due to drop in Hg 1/23.  - Pt transfused two units PRBC 1/23, one unit 1/24 - IVC filters not placed in arm - Hemoglobin 12/26/2015 was 8. Repeat CBC tomorrow morning. SNF - f/u CBCs; pt not on [facility]

## 2016-01-06 NOTE — Assessment & Plan Note (Signed)
2/2 perforated viscous;has finished abx tx; admitted to SNF with surgical drains

## 2016-01-06 NOTE — Assessment & Plan Note (Signed)
2/2 perferated viscus; SNF - will be admitted with darains; will follow

## 2016-01-06 NOTE — Assessment & Plan Note (Signed)
SNF was tx with neurontin prior, then pain meds in hospital, sent out on pain meds 2/2 surgery, will get pt back on neurontin

## 2016-01-06 NOTE — Assessment & Plan Note (Signed)
EEG done 11/30/2015 showed no acute seizures - If seizures occur, use Keppra

## 2016-01-06 NOTE — Assessment & Plan Note (Signed)
Stopped heparin drip due to drop in Hg 1/23.  - Pt transfused two units PRBC 1/23, one unit 1/24 -Hemoglobin 12/26/2015 was 8. Repeat CBC tomorrow morning. SNF - will follow CBC

## 2016-01-06 NOTE — Assessment & Plan Note (Signed)
Patient required ventilatory support after exploratory laparotomy 12/01/2015.  - Patient self extubated 12/02/2015 - Patient has received Lasix 80 mg IV on 12/05/2015 but no Lasix since then.

## 2016-01-06 NOTE — Assessment & Plan Note (Signed)
SNF - may be 2/2 heparin; will follow with CBC

## 2016-01-06 NOTE — Assessment & Plan Note (Signed)
In the setting of sepsis and small bowel obstruction with perforated viscus - Creatinine normalized with fluids SNF - will follow with BMP

## 2016-01-06 NOTE — Assessment & Plan Note (Signed)
Principal problem: Sepsis secondary to SBO w/ perforated viscous and resultant peritonitis / Postoperative ileus / leukocytosis  - S/p abdominal fluid collection drainages 1/13 with removal of RLQ drain 1/20, repositioning of LUQ drain 1/20 and placement of new right subphrenic drain 1/20 - S/P removal of RLQ drain 1/20, repositioning of LUQ drain 1/20 and placement of new right subphrenic drain 1/20 - CT scan repeated 12/25/2015 and per surgery drains should remain in place. - Patient has completed appropriate course of antibiotics. Please refer to anti-infectives section on antibiotics during this hospital stay.

## 2016-01-06 NOTE — Assessment & Plan Note (Signed)
SNF A1c 6.9; will cont SSI

## 2016-01-06 NOTE — Assessment & Plan Note (Signed)
In the context of chronic illness, small bowel obstruction with peritonitis - Please note patient malnourished because of poor by mouth intake however based on BMI he's not under weight. His BMI is 20. SNF - Diet as tolerated with protein supplements

## 2016-01-06 NOTE — Assessment & Plan Note (Signed)
SNF - cont supplementation

## 2016-01-08 ENCOUNTER — Other Ambulatory Visit: Payer: Self-pay | Admitting: *Deleted

## 2016-01-08 MED ORDER — OXYCODONE HCL 10 MG PO TABS: ORAL_TABLET | ORAL | Status: DC

## 2016-01-08 NOTE — Telephone Encounter (Signed)
[facility]

## 2016-01-09 ENCOUNTER — Ambulatory Visit
Admission: RE | Admit: 2016-01-09 | Discharge: 2016-01-09 | Disposition: A | Discharge status: Home / Self Care | Payer: Medicaid Other | Source: Ambulatory Visit | Attending: Radiology | Admitting: Radiology

## 2016-01-09 ENCOUNTER — Ambulatory Visit
Admission: RE | Admit: 2016-01-09 | Discharge: 2016-01-09 | Disposition: A | Discharge status: Home / Self Care | Payer: Medicaid Other | Source: Ambulatory Visit | Attending: General Surgery | Admitting: General Surgery

## 2016-01-09 ENCOUNTER — Other Ambulatory Visit: Payer: Self-pay | Admitting: General Surgery

## 2016-01-09 ENCOUNTER — Other Ambulatory Visit (HOSPITAL_COMMUNITY): Payer: Self-pay | Admitting: Radiology

## 2016-01-09 ENCOUNTER — Other Ambulatory Visit (HOSPITAL_COMMUNITY): Payer: Self-pay | Admitting: Interventional Radiology

## 2016-01-09 DIAGNOSIS — K651 Peritoneal abscess: Secondary | ICD-10-CM

## 2016-01-09 MED ORDER — IOPAMIDOL (ISOVUE-300) INJECTION 61%
100.0000 mL | Freq: Once | INTRAVENOUS | Status: AC | PRN
  Administered 2016-01-09: 100 mL via INTRAVENOUS

## 2016-01-09 NOTE — Progress Notes (Signed)
Patient ID: [name] [name], male   DOB: [birth year], 65 y.o.   MRN: [id number]       Chief Complaint: Outpatient follow-up with imaging of the bilateral abscess drain catheters.  Referring Physician(s): Turpin,Pamela  History of Present Illness: [name] [name] is a 65 y.o. male with multiple postoperative abdominal abscesses. Bilateral subdiaphragmatic abscesses have percutaneous drains. He reports approximately 20-40 mL of output from both catheters. Right drain is to gravity bag while the left is to a suction bulb. No interval fevers. Overall he is recovering at a rehabilitation facility. No significant abdominal or flank pain. He is tolerating a regular diet and beginning to gain weight. Normal bowel habits. He returns for outpatient imaging and drain assessment.  No past medical history on file.  Past Surgical History  Procedure Laterality Date  . Iliac artery stent  05/06/2011  . Tonsillectomy    . Aorta - bilateral femoral artery bypass graft  11/25/2011    Procedure: AORTA BIFEMORAL BYPASS GRAFT;  Surgeon: Rosetta Posner, MD;  Location: [place];  Service: Vascular;  Laterality: N/A;  . Pr vein bypass graft,aorto-fem-pop  11/24/2012  . Inguinal hernia repair Right   . Abdominal aortagram N/A 11/11/2011    Procedure: ABDOMINAL Maxcine Ham;  Surgeon: Angelia Mould, MD;  Location: [facility] CATH LAB;  Service: Cardiovascular;  Laterality: N/A;  . Lower extremity angiogram Bilateral 11/11/2011    Procedure: LOWER EXTREMITY ANGIOGRAM;  Surgeon: Angelia Mould, MD;  Location: [facility] CATH LAB;  Service: Cardiovascular;  Laterality: Bilateral;  . Left heart catheterization with coronary angiogram N/A 06/07/2013    Procedure: LEFT HEART CATHETERIZATION WITH CORONARY ANGIOGRAM;  Surgeon: Sherren Mocha, MD;  Location: [facility] CATH LAB;  Service: Cardiovascular;  Laterality: N/A;  . Percutaneous coronary stent intervention (pci-s) Right 06/07/2013    Procedure: PERCUTANEOUS CORONARY STENT  INTERVENTION (PCI-S);  Surgeon: Sherren Mocha, MD;  Location: [facility] CATH LAB;  Service: Cardiovascular;  Laterality: Right;  . Left heart catheterization with coronary angiogram N/A 12/10/2013    Procedure: LEFT HEART CATHETERIZATION WITH CORONARY ANGIOGRAM;  Surgeon: Blane Ohara, MD;  Location: [facility] CATH LAB;  Service: Cardiovascular;  Laterality: N/A;  . Colonoscopy    . Peripheral vascular catheterization N/A 07/26/2015    Procedure: Abdominal Aortogram;  Surgeon: Serafina Mitchell, MD;  Location: [place] CV LAB;  Service: Cardiovascular;  Laterality: N/A;  . Peripheral vascular catheterization Bilateral 07/26/2015    Procedure: Lower Extremity Angiography;  Surgeon: Serafina Mitchell, MD;  Location: [place] CV LAB;  Service: Cardiovascular;  Laterality: Bilateral;  . Femoral-popliteal bypass graft Left 07/28/2015    Procedure: LEFT FEMORAL-POPLITEAL ARTERY BYPASS GRAFT;  Surgeon: Rosetta Posner, MD;  Location: [place];  Service: Vascular;  Laterality: Left;  . Laparotomy N/A 12/01/2015    Procedure: EXPLORATORY LAPAROTOMY WITH RADICAL PERITONEAL DEBRIDEMENT, CLOSURE OF SMALL BOWEL PERFORATION AND LYSIS OF ADHESIONS.;  Surgeon: Johnathan Hausen, MD;  Location: [facility];  Service: General;  Laterality: N/A;    Allergies: Zetia; Atorvastatin; Penicillins; Pravastatin sodium; Lisinopril; and Promethazine  Medications: Prior to Admission medications   Medication Sig Start Date End Date Taking? Authorizing Provider  acetaminophen (TYLENOL) 325 MG tablet Take 2 tablets (650 mg total) by mouth every 6 (six) hours as needed for fever. 12/29/15   Robbie Lis, MD  albuterol (PROVENTIL HFA;VENTOLIN HFA) 108 (90 BASE) MCG/ACT inhaler Inhale 2 puffs into the lungs every 6 (six) hours as needed for wheezing or shortness of breath. 11/11/13   Ripudeep K  Rai, MD  alum & mag hydroxide-simeth (MAALOX/MYLANTA) 200-200-20 MG/5ML suspension Take 30 mLs by mouth every 6 (six) hours as needed for indigestion or heartburn  (or bloating). 12/29/15   Robbie Lis, MD  antiseptic oral rinse (CPC / CETYLPYRIDINIUM CHLORIDE 0.05%) 0.05 % LIQD solution 7 mLs by Mouth Rinse route 2 times daily at 12 noon and 4 pm. 12/29/15   Robbie Lis, MD  bacitracin ointment Apply topically daily. 12/29/15   Robbie Lis, MD  bisacodyl (DULCOLAX) 10 MG suppository Place 1 suppository (10 mg total) rectally every 12 (twelve) hours as needed for mild constipation or moderate constipation. 12/29/15   Robbie Lis, MD  collagenase (SANTYL) ointment Apply thin layer to wound bed of left foot daily Patient taking differently: Apply 1 application topically daily. Apply thin layer to wound bed of left foot daily 10/26/15   Rosetta Posner, MD  diphenhydrAMINE (BENADRYL) 25 mg capsule Take 1 capsule (25 mg total) by mouth every 6 (six) hours as needed for itching, allergies or sleep (anxiety). 12/29/15   Robbie Lis, MD  feeding supplement, ENSURE ENLIVE, (ENSURE ENLIVE) LIQD Take 237 mLs by mouth 3 (three) times daily between meals. 12/29/15   Robbie Lis, MD  insulin aspart (NOVOLOG) 100 UNIT/ML injection Inject 0-15 Units into the skin 3 (three) times daily with meals. 12/29/15   Robbie Lis, MD  magnesium oxide (MAG-OX) 400 (241.3 Mg) MG tablet Take 1 tablet (400 mg total) by mouth 2 (two) times daily. 12/29/15   Robbie Lis, MD  methocarbamol (ROBAXIN) 500 MG tablet Take 2 tablets (1,000 mg total) by mouth 3 (three) times daily. 12/29/15   Robbie Lis, MD  Multiple Vitamins-Minerals (MULTIVITAMIN WITH MINERALS) tablet Take 4 tablets by mouth 3 (three) times daily.    Historical Provider, MD  oxyCODONE 20 MG TABS Take 1 tablet (20 mg total) by mouth every 4 (four) hours as needed for moderate pain. 12/29/15   Robbie Lis, MD  Oxycodone HCl 10 MG TABS Take two tablets by mouth every 4 hours as needed for pain 01/08/16   Tiffany L Reed, DO  thiamine 100 MG tablet Take 1 tablet (100 mg total) by mouth daily. 12/29/15   Robbie Lis, MD  triamcinolone cream  (KENALOG) 0.1 % Apply 1 application topically 2 (two) times daily as needed (for eczema).    Historical Provider, MD     Family History  Problem Relation Age of Onset  . Hypertension Mother   . Heart attack Mother   . Diabetes Mother   . Aneurysm Father   . Heart attack Sister   . Heart disease Brother     heart transplant    Social History   Social History  . Marital Status: Single    Spouse Name: N/A  . Number of Children: N/A  . Years of Education: N/A   Social History Main Topics  . Smoking status: Current Every Day Smoker -- 0.50 packs/day for 46 years    Types: Cigarettes  . Smokeless tobacco: Never Used  . Alcohol Use: 0.0 oz/week    0 Standard drinks or equivalent per week     Comment: [id number] "last drink was 3-4 yr ago; never had problem w/it"  . Drug Use: 1.00 per week    Special: Marijuana, Cocaine     Comment: stopped May 07, 2012  . Sexual Activity: Not Currently   Other Topics Concern  . Not on file  Social History Narrative   Review of Systems: A 12 point ROS discussed and pertinent positives are indicated in the HPI above.  All other systems are negative.  Review of Systems  Vital Signs: BP 109/79 mmHg  Pulse 94  Temp(Src) 97.7 F (36.5 C)  SpO2 95%  Physical Exam  Constitutional:  Thin frail male in no distress.  Abdominal: Soft. Bowel sounds are normal. He exhibits no distension. There is no tenderness.  Bilateral upper quadrant drain catheter sites are clean, dry and intact. Exudative fluid within the drainage bag on the right and the suction bulb on the left.     Imaging: Ct Abdomen Pelvis Wo Contrast  12/14/2015  CLINICAL DATA:  Status post laparoscopic surgery and repair of small bowel perforation. EXAM: CT ABDOMEN AND PELVIS WITHOUT CONTRAST TECHNIQUE: Multidetector CT imaging of the abdomen and pelvis was performed following the standard protocol without IV contrast. COMPARISON:  CT scan of December 07, 2015. FINDINGS: Severe  degenerative disc disease is noted at L5-S1. Mild left pleural effusion is noted with adjacent subsegmental atelectasis. Moderate right pleural effusion is noted with associated atelectasis or pneumonia of the right middle and lower lobes. No gallstones are noted. No focal abnormality is noted in the liver, spleen or pancreas on these unenhanced images. Adrenal glands are unremarkable. Stable large right renal cyst is noted. No hydronephrosis or renal obstruction is noted. Status post aortobifemoral bypass graft placement for treatment of abdominal aortic aneurysm. There is been interval placement of percutaneous drainage into perihepatic fluid collection. This fluid collection is slightly smaller compared to prior exam. There has also been interval placement of percutaneous drainage catheter into left upper quadrant fluid collection which is significantly smaller compared to prior exam. There is also been interval placement of percutaneous drainage catheter in right lateral peritoneal fluid collection, which is also significantly smaller compared to prior exam. Large amount of contrast is seen throughout the colon which is nondilated. Diffuse wall and fold thickening is noted throughout multiple loops of small bowel suggesting inflammation or enteritis. No significant small bowel dilatation is noted. Urinary bladder appears normal. Moderate anasarca is again noted. Minimal to mild residual ascites is noted. IMPRESSION: Bilateral pleural effusions are noted, with right greater than left. There appears to be some degree of atelectasis or pneumonia involving the right lower and middle lobes. Status post interval placement of 3 percutaneous drainage catheters. There is 1 in the perihepatic fluid collection, which is slightly smaller compared to prior exam. Another is noted in left upper quadrant fluid collection which is significantly smaller compared to prior exam. A third is noted in right lateral peritoneal fluid  collection, which is also significantly smaller and nearly resolved compared to prior exam. Large amount of contrast is seen throughout the colon which is nondilated. Diffuse wall and fold thickening is seen throughout multiple loops small bowel suggesting inflammation or enteritis, but no significant bowel dilatation is noted Electronically Signed   By: Marijo Conception, M.D.   On: 12/14/2015 14:09   Dg Chest 2 View  12/14/2015  CLINICAL DATA:  65 year old male with postoperative respiratory failure, hypoxemia and sepsis. EXAM: CHEST  2 VIEW COMPARISON:  12/06/2015 chest radiograph.  12/14/2015 abdominal CT. FINDINGS: The cardiomediastinal silhouette is unchanged. A left IJ central venous catheter with tip overlying the lower SVC again noted. Moderate bilateral pleural effusions have increased in size, right greater than left. Increasing bilateral lower lung atelectasis/ consolidation noted. There is no evidence of pneumothorax. Pulmonary vascular congestion  is present. IMPRESSION: Enlarging moderate bilateral pleural effusions, right greater than left, with increasing bilateral lower lung atelectasis/ consolidation. Pulmonary vascular congestion. Electronically Signed   By: Margarette Canada M.D.   On: 12/14/2015 19:11   Ct Abdomen Pelvis W Contrast  01/09/2016  CLINICAL DATA:  Postop multiple intra-abdominal abscesses, status post percutaneous aspirations and 2 percutaneous drains. Subsequent encounter as an outpatient. EXAM: CT ABDOMEN AND PELVIS WITH CONTRAST TECHNIQUE: Multidetector CT imaging of the abdomen and pelvis was performed using the standard protocol following bolus administration of intravenous contrast. CONTRAST:  120mL ISOVUE-300 IOPAMIDOL (ISOVUE-300) INJECTION 61% COMPARISON:  12/25/2015 FINDINGS: Lower chest: Pleural effusions are much smaller with improvement in the bibasilar atelectasis as well. Normal heart size. No pericardial effusion. Hepatobiliary: Right upper quadrant perihepatic  subdiaphragmatic abscess is smaller. Stable drain catheter position. Measuring at a similar location the residual right upper quadrant abscess measures 6.2 x 1.9 cm, previously 9.7 x 1.8 cm. No other focal hepatic abnormality or biliary dilatation. Patent portal vein. Gallbladder and biliary system unremarkable. Pancreas: No mass, inflammatory changes, or other significant abnormality. Spleen: Normal in size and appearance. Left upper quadrant subdiaphragmatic perisplenic drain is stable in position. There is improvement in this abscess as well. Measuring at a similar level, the residual left upper quadrant abscess measure 6.1 x 2.2 cm, previously 10.2 x 2.2 cm. Adrenals/Urinary Tract: Normal adrenal glands. Stable bilateral renal cysts. No hydronephrosis or obstruction. Stomach/Bowel: Negative for bowel obstruction, significant dilatation, or ileus. No free air evident. Right abdominal pericolic gutter small peripheral enhancing abscess is smaller measuring 22 x 11 mm, previously 46 x 27 mm. A small left mid abdominal residual abscess is smaller measuring 24 x 12 mm, previously 76 x 15 mm. No new abdominal collections. Small right pelvic collection has resolved. Vascular/Lymphatic: No adenopathy. Postop changes of the aorto bi femoral bypass again noted. Reproductive: No mass or other significant abnormality. Other: No inguinal abnormality or hernia.  Intact abdominal wall. Musculoskeletal: Degenerative changes of the spine. No acute osseous finding. IMPRESSION: Interval improvement in the bilateral subdiaphragmatic abscesses. Stable drain catheter positions. small residual abscesses remain at both sites. Drain catheters will remain to external suction balls. Small bilateral abdominal abscesses continue to resolve. Small right pelvic abscess has resolved. No new collections demonstrated. Improvement in the pleural effusions and bibasilar atelectasis Negative for bowel obstruction or free air. Electronically Signed    By: Jerilynn Mages.  Alyaan Budzynski M.D.   On: 01/09/2016 11:39   Ct Abdomen Pelvis W Contrast  12/25/2015  CLINICAL DATA:  Abscess of abdominal cavity. EXAM: CT ABDOMEN AND PELVIS WITH CONTRAST TECHNIQUE: Multidetector CT imaging of the abdomen and pelvis was performed using the standard protocol following bolus administration of intravenous contrast. CONTRAST:  136mL OMNIPAQUE IOHEXOL 300 MG/ML  SOLN COMPARISON:  CT scan of December 14, 2015 and drainage procedure of December 15, 2015. FINDINGS: Severe degenerative disc disease is noted at L5-S1. Bilateral pleural effusions are noted with adjacent subsegmental atelectasis, with left greater than right. No gallstones are noted. Right perihepatic crescent shaped fluid collection is significantly smaller compared to prior exam status post placement of drainage catheter, which is seen over the dome of right hepatic lobe. It currently measures 9.7 x 1.8 cm. Drainage catheter seen in left perisplenic fluid collection is unchanged in position, with no significant change in the size of this fluid collection pancreas appears normal. Bilateral renal cysts are unchanged. Adrenal glands appear normal. No hydronephrosis or renal obstruction is noted. Drainage catheter noted in right  lateral fluid collection on prior study has been removed. 4.6 x 2.8 cm residual fluid collection is noted. Left lateral peritoneal fluid collection is noted which measures 7.6 x 1.5 cm which may be slightly smaller compared to prior exam. There appears to be no evidence of bowel obstruction. Large amount of stool is noted in the sigmoid colon and rectum. Urinary bladder appears normal. Multiple other smaller fluid collections are noted posteriorly in the pelvis, with the largest measuring 3.1 x 1.4 cm. IMPRESSION: Stable bilateral pleural effusions are noted with adjacent subsegmental atelectasis, left greater than right. Right para Padda crescent shaped fluid collection is significantly smaller compared to prior  exam, status post placement of new drainage catheter, tip of which is seen over dome of right hepatic lobe. No change seen in position of drainage catheter within left perisplenic fluid collection, which is unchanged in size compared to prior exam. There is been interval removal of drainage catheter from right lateral fluid collection noted on prior exam, with residual fluid collection measuring 4.6 x 2.8 cm which is not significantly changed compared to prior exam. Left lateral peritoneal fluid collection is noted measuring 7.6 x 1.5 cm which may be slightly smaller compared to prior exam. There are noted multiple smaller and probably loculated fluid collections seen posteriorly in the pelvis, with the largest single collection measuring 3.1 x 1.4 cm. Electronically Signed   By: Marijo Conception, M.D.   On: 12/25/2015 16:33   Ir Sinus/fist Tube Chk-non Gi  12/15/2015  CLINICAL DATA:  65 yo male with a history of perforated small bowel and acute abdomen status post exploratory laparotomy, washout and small bowel repair. Rib his postoperative course has been complicated by development of multiple intra-abdominal abscesses. Percutaneous drainage was performed 12/08/2015 with placement of 3 drainage catheters. CT imaging earlier today demonstrates that the right lower quadrant drainage catheter has successfully drained at the intended fluid collection. This drain can be removed. Both of the upper quadrant drainage catheters need to be repositioned into the subdiaphragmatic spaces to drain persistent fluid in those regions. EXAM: EXAM SINUS TRACT INJECTION/FISTULOGRAM Date: 12/15/2015 PROCEDURE: PROCEDURE 1. Removal of right lower quadrant drain 2. Attempted repositioning of right upper quadrant drain with ultimate removal of drainage catheter 3. Successful repositioning and up size of left upper quadrant drain Interventional Radiologist: Criselda Peaches, MD ANESTHESIA/SEDATION: ANESTHESIA/SEDATION 50 mcg fentanyl  administered during the course of the procedure MEDICATIONS: MEDICATIONS None additional FLUOROSCOPY TIME:  11 minutes 30 seconds for a total of 167 mGy CONTRAST:  3mL OMNIPAQUE IOHEXOL 300 MG/ML SOLN TECHNIQUE: TECHNIQUE Informed consent was obtained from the patient following explanation of the procedure, risks, benefits and alternatives. The patient understands, agrees and consents for the procedure. All questions were addressed. A time out was performed. Maximal barrier sterile technique utilized including caps, mask, sterile gowns, sterile gloves, large sterile drape, hand hygiene, and Betadine skin prep. The right lower quadrant drainage catheter was cut and removed. Contrast was injected through the right upper quadrant drainage catheter. This confirms that there is no residual abscess cavity at the drainage a loop of the catheter. However, shortly beyond the entry point of the catheter into the abdominal wall there is a communication with a larger fluid collection in the subdiaphragmatic space. This catheter was cut and removed over a Bentson wire. A 5 French catheter was navigated over the wire. Multiple attempts with several wires were made in an effort to reposition the wire into the subdiaphragmatic space. This was  ultimately unsuccessful. The decision was made to pursue replacement of the right upper quadrant drainage catheter under CT guidance. Attention was turned to the left upper quadrant drainage catheter. Contrast was injected, again, shortly beyond the entry point into the left upper quadrant there is a communication with the larger subdiaphragmatic fluid collection. The fluid collection around the draining loop of the catheter has resolved. This catheter was cut and removed over a Bentson wire. Using a 5 French angled catheter and glidewire, the eat wire was successfully navigated into did subdiaphragmatic space. The catheter was advanced into the space in the glidewire exchanged for an Amplatz  wire. A Cook 12 [place] all-purpose drainage catheter was advanced over the wire and formed in the subdiaphragmatic space. There was immediate return of approximately 150 cc of opaque tan fluid and debris. The catheter was secured in place with 0 Prolene suture and connected to JP bulb drainage. Bandages were applied. The patient tolerated the procedure well. COMPLICATIONS: COMPLICATIONS None IMPRESSION: IMPRESSION 1. The right lower quadrant drain was removed. 2. Unsuccessful attempted repositioning of the right upper quadrant drainage catheter. The catheter could not be manipulated up into the subdiaphragmatic space and was therefore removed. This catheter will be replaced under CT guidance tomorrow 12/14/14. 3. Successful repositioning of the left upper quadrant drainage catheter into the subdiaphragmatic collection with return of copious opaque tan colored fluid and debris. Electronically Signed   By: Jacqulynn Cadet M.D.   On: 12/15/2015 17:08   Ir Sinus/fist Tube Chk-non Gi  12/15/2015  CLINICAL DATA:  65 yo male with a history of perforated small bowel and acute abdomen status post exploratory laparotomy, washout and small bowel repair. Rib his postoperative course has been complicated by development of multiple intra-abdominal abscesses. Percutaneous drainage was performed 12/08/2015 with placement of 3 drainage catheters. CT imaging earlier today demonstrates that the right lower quadrant drainage catheter has successfully drained at the intended fluid collection. This drain can be removed. Both of the upper quadrant drainage catheters need to be repositioned into the subdiaphragmatic spaces to drain persistent fluid in those regions. EXAM: SINUS TRACT INJECTION/FISTULOGRAM Date: 12/15/2015 PROCEDURE: 1. Removal of right lower quadrant drain 2. Attempted repositioning of right upper quadrant drain with ultimate removal of drainage catheter 3. Successful repositioning and up size of left upper quadrant  drain Interventional Radiologist:  Criselda Peaches, MD ANESTHESIA/SEDATION: 50 mcg fentanyl administered during the course of the procedure MEDICATIONS: None additional FLUOROSCOPY TIME:  11 minutes 30 seconds for a total of 167 mGy CONTRAST:  34mL OMNIPAQUE IOHEXOL 300 MG/ML  SOLN TECHNIQUE: Informed consent was obtained from the patient following explanation of the procedure, risks, benefits and alternatives. The patient understands, agrees and consents for the procedure. All questions were addressed. A time out was performed. Maximal barrier sterile technique utilized including caps, mask, sterile gowns, sterile gloves, large sterile drape, hand hygiene, and Betadine skin prep. The right lower quadrant drainage catheter was cut and removed. Contrast was injected through the right upper quadrant drainage catheter. This confirms that there is no residual abscess cavity at the drainage a loop of the catheter. However, shortly beyond the entry point of the catheter into the abdominal wall there is a communication with a larger fluid collection in the subdiaphragmatic space. This catheter was cut and removed over a Bentson wire. A 5 French catheter was navigated over the wire. Multiple attempts with several wires were made in an effort to reposition the wire into the subdiaphragmatic space. This was  ultimately unsuccessful. The decision was made to pursue replacement of the right upper quadrant drainage catheter under CT guidance. Attention was turned to the left upper quadrant drainage catheter. Contrast was injected, again, shortly beyond the entry point into the left upper quadrant there is a communication with the larger subdiaphragmatic fluid collection. The fluid collection around the draining loop of the catheter has resolved. This catheter was cut and removed over a Bentson wire. Using a 5 French angled catheter and glidewire, the eat wire was successfully navigated into did subdiaphragmatic space. The  catheter was advanced into the space in the glidewire exchanged for an Amplatz wire. A Cook 12 [place] all-purpose drainage catheter was advanced over the wire and formed in the subdiaphragmatic space. There was immediate return of approximately 150 cc of opaque tan fluid and debris. The catheter was secured in place with 0 Prolene suture and connected to JP bulb drainage. Bandages were applied. The patient tolerated the procedure well. COMPLICATIONS: None IMPRESSION: 1. The right lower quadrant drain was removed. 2. Unsuccessful attempted repositioning of the right upper quadrant drainage catheter. The catheter could not be manipulated up into the subdiaphragmatic space and was therefore removed. This catheter will be replaced under CT guidance tomorrow 12/14/14. 3. Successful repositioning of the left upper quadrant drainage catheter into the subdiaphragmatic collection with return of copious opaque tan colored fluid and debris. Signed, Criselda Peaches, MD [facility] Radiology Electronically Signed   By: Jacqulynn Cadet M.D.   On: 12/15/2015 07:49   Ir Catheter Tube Change  12/15/2015  CLINICAL DATA:  65 yo male with a history of perforated small bowel and acute abdomen status post exploratory laparotomy, washout and small bowel repair. Rib his postoperative course has been complicated by development of multiple intra-abdominal abscesses. Percutaneous drainage was performed 12/08/2015 with placement of 3 drainage catheters. CT imaging earlier today demonstrates that the right lower quadrant drainage catheter has successfully drained at the intended fluid collection. This drain can be removed. Both of the upper quadrant drainage catheters need to be repositioned into the subdiaphragmatic spaces to drain persistent fluid in those regions. EXAM: EXAM SINUS TRACT INJECTION/FISTULOGRAM Date: 12/15/2015 PROCEDURE: PROCEDURE 1. Removal of right lower quadrant drain  2. Attempted repositioning of right upper quadrant drain with ultimate removal of drainage catheter 3. Successful repositioning and up size of left upper quadrant drain Interventional Radiologist: Criselda Peaches, MD ANESTHESIA/SEDATION: ANESTHESIA/SEDATION 50 mcg fentanyl administered during the course of the procedure MEDICATIONS: MEDICATIONS None additional FLUOROSCOPY TIME:  11 minutes 30 seconds for a total of 167 mGy CONTRAST:  40mL OMNIPAQUE IOHEXOL 300 MG/ML SOLN TECHNIQUE: TECHNIQUE Informed consent was obtained from the patient following explanation of the procedure, risks, benefits and alternatives. The patient understands, agrees and consents for the procedure. All questions were addressed. A time out was performed. Maximal barrier sterile technique utilized including caps, mask, sterile gowns, sterile gloves, large sterile drape, hand hygiene, and Betadine skin prep. The right lower quadrant drainage catheter was cut and removed. Contrast was injected through the right upper quadrant drainage catheter. This confirms that there is no residual abscess cavity at the drainage a loop of the catheter. However, shortly beyond the entry point of the catheter into the abdominal wall there is a communication with a larger fluid collection in the subdiaphragmatic space. This catheter was cut and removed over a Bentson wire. A 5 French catheter was navigated over the wire. Multiple attempts with several wires were made in  an effort to reposition the wire into the subdiaphragmatic space. This was ultimately unsuccessful. The decision was made to pursue replacement of the right upper quadrant drainage catheter under CT guidance. Attention was turned to the left upper quadrant drainage catheter. Contrast was injected, again, shortly beyond the entry point into the left upper quadrant there is a communication with the larger subdiaphragmatic fluid collection. The fluid collection around the draining loop of the  catheter has resolved. This catheter was cut and removed over a Bentson wire. Using a 5 French angled catheter and glidewire, the eat wire was successfully navigated into did subdiaphragmatic space. The catheter was advanced into the space in the glidewire exchanged for an Amplatz wire. A Cook 12 [place] all-purpose drainage catheter was advanced over the wire and formed in the subdiaphragmatic space. There was immediate return of approximately 150 cc of opaque tan fluid and debris. The catheter was secured in place with 0 Prolene suture and connected to JP bulb drainage. Bandages were applied. The patient tolerated the procedure well. COMPLICATIONS: COMPLICATIONS None IMPRESSION: IMPRESSION 1. The right lower quadrant drain was removed. 2. Unsuccessful attempted repositioning of the right upper quadrant drainage catheter. The catheter could not be manipulated up into the subdiaphragmatic space and was therefore removed. This catheter will be replaced under CT guidance tomorrow 12/14/14. 3. Successful repositioning of the left upper quadrant drainage catheter into the subdiaphragmatic collection with return of copious opaque tan colored fluid and debris. Electronically Signed   By: Jacqulynn Cadet M.D.   On: 12/15/2015 17:08   Ct Image Guided Fluid Drain By Catheter  12/15/2015  CLINICAL DATA:  Small bowel perforation with multiple post-operative peritoneal fluid collections and increasing leukocytosis. Multiple percutaneous drainage catheters have previously been placed. There is a large undrained right subphrenic collection. EXAM: CT GUIDED DRAINAGE OF RIGHT SUBPHRENIC ABSCESS ANESTHESIA/SEDATION: Intravenous Fentanyl and Versed were administered as conscious sedation during continuous monitoring of the patient's level of consciousness and physiological / cardiorespiratory status by the radiology RN, with a total moderate sedation time of 10 minutes. PROCEDURE: The procedure, risks, benefits, and alternatives  were explained to the patient. Questions regarding the procedure were encouraged and answered. The patient understands and consents to the procedure. Select axial scans through the upper abdomen were obtained. Patient was placed in left posterior oblique position an additional scans obtained. The right subphrenic collection was localized and an appropriate skin entry site was identified and marked. The operative field was prepped with Betadinein a sterile fashion, and a sterile drape was applied covering the operative field. A sterile gown and sterile gloves were used for the procedure. Local anesthesia was provided with 1% Lidocaine. Under CT fluoroscopic guidance, a 19 gauge percutaneous entry needle was advanced into the inferior aspect of the collection. Thin somewhat cloudy pale yellow fluid could be aspirated. A guidewire advanced easily within the collection, its position confirmed on CT fluoro. Tract was dilated to facilitate placement of a 12 French pigtail catheter, formed centrally in the subphrenic component of the collection. 10 mL of opaque yellowish thin fluid were aspirated sent for Gram stain, culture and sensitivity. Catheter secured externally with 0 Prolene suture and StatLock and placed to gravity drain bag. The patient tolerated the procedure well. COMPLICATIONS: None immediate FINDINGS: Limited CT demonstrates some interval increase in bilateral pleural effusions since prior scan of 12/14/2015. Center venous catheter to the distal SVC. Patchy coronary calcifications. There is a complex gas and fluid collection extending from anterior to the right hepatic  lobe into the right subhepatic space. There is no contains some contrast material since yesterday's catheter injection. The left upper quadrant drain catheter has been redirected into the subphrenic collection which has significantly resolved since the previous day's exam. Percutaneous 12 French pigtail drain catheter placed into the right  subphrenic collection as detailed above. IMPRESSION: 1. Technically successful CT-guided right subphrenic abscess drain catheter placement. Electronically Signed   By: Lucrezia Europe M.D.   On: 12/15/2015 14:57   Ct Image Guided Drainage Percut Cath  Peritoneal Retroperit  12/27/2015  INDICATION: Abdominal abscess on the right and left flanks EXAM: CT GUIDED DRAINAGE OF BILATERAL ABSCESS MEDICATIONS: The patient is currently admitted to the hospital and receiving intravenous antibiotics. The antibiotics were administered within an appropriate time frame prior to the initiation of the procedure. ANESTHESIA/SEDATION: 0.5 mg IV Versed 25 mcg IV Fentanyl Moderate Sedation Time:  13 The patient was continuously monitored during the procedure by the interventional radiology nurse under my direct supervision. COMPLICATIONS: None immediate. TECHNIQUE: Informed written consent was obtained from the patient after a thorough discussion of the procedural risks, benefits and alternatives. All questions were addressed. Maximal Sterile Barrier Technique was utilized including caps, mask, sterile gowns, sterile gloves, sterile drape, hand hygiene and skin antiseptic. A timeout was performed prior to the initiation of the procedure. PROCEDURE: The abdominal region was prepped with ChloraPrep in a sterile fashion, and a sterile drape was applied covering the operative field. A sterile gown and sterile gloves were used for the procedure. Local anesthesia was provided with 1% Lidocaine. Under CT guidance, a Yueh Angiocath was inserted into the left flank abscess. 5 cc of pus was aspirated. Under CT guidance, a Teressa Lower Angiocath was placed in the right flank abscess and an additional 5 cc of pus was aspirated. Samples were sent for culture. FINDINGS: Images document you we Angiocath placement in the right and left flank abscesses. Post aspiration imaging demonstrates no hemorrhage and some improvement in the size of the abscesses.  IMPRESSION: Successful right and left pelvic abscess drainage utilizing UE Angiocath. Electronically Signed   By: Marybelle Killings M.D.   On: 12/27/2015 16:13   Ct Image Guided Drainage Percut Cath  Peritoneal Retroperit  12/27/2015  INDICATION: Abdominal abscess on the right and left flanks EXAM: CT GUIDED DRAINAGE OF BILATERAL ABSCESS MEDICATIONS: The patient is currently admitted to the hospital and receiving intravenous antibiotics. The antibiotics were administered within an appropriate time frame prior to the initiation of the procedure. ANESTHESIA/SEDATION: 0.5 mg IV Versed 25 mcg IV Fentanyl Moderate Sedation Time:  13 The patient was continuously monitored during the procedure by the interventional radiology nurse under my direct supervision. COMPLICATIONS: None immediate. TECHNIQUE: Informed written consent was obtained from the patient after a thorough discussion of the procedural risks, benefits and alternatives. All questions were addressed. Maximal Sterile Barrier Technique was utilized including caps, mask, sterile gowns, sterile gloves, sterile drape, hand hygiene and skin antiseptic. A timeout was performed prior to the initiation of the procedure. PROCEDURE: The abdominal region was prepped with ChloraPrep in a sterile fashion, and a sterile drape was applied covering the operative field. A sterile gown and sterile gloves were used for the procedure. Local anesthesia was provided with 1% Lidocaine. Under CT guidance, a Yueh Angiocath was inserted into the left flank abscess. 5 cc of pus was aspirated. Under CT guidance, a Teressa Lower Angiocath was placed in the right flank abscess and an additional 5 cc of pus was aspirated. Samples were  sent for culture. FINDINGS: Images document you we Angiocath placement in the right and left flank abscesses. Post aspiration imaging demonstrates no hemorrhage and some improvement in the size of the abscesses. IMPRESSION: Successful right and left pelvic abscess drainage  utilizing UE Angiocath. Electronically Signed   By: Marybelle Killings M.D.   On: 12/27/2015 16:13    Labs:  CBC:  Recent Labs  12/25/15 0450 12/26/15 0415 12/29/15 0502 12/30/15 0614  WBC 14.1* 12.5* 12.0* 10.8*  HGB 7.8* 8.0* 7.6* 7.7*  HCT 25.4* 25.8* 24.7* 24.3*  PLT 459* 226 315 332    COAGS:  Recent Labs  07/26/15 1415 12/06/15 0925 12/08/15 0340 12/27/15 0415  INR 1.09 1.89* 1.82* 1.28  APTT 37 45*  --   --     BMP:  Recent Labs  12/25/15 0450 12/27/15 0940 12/28/15 0620 12/29/15 0502 12/30/15 0614  NA 135  --  131* 130* 131*  K 5.1 4.8 4.6 4.8 4.7  CL 95*  --  92* 92* 94*  CO2 32  --  32 33* 32  GLUCOSE 157*  --  169* 163* 179*  BUN 20  --  18 17 18   CALCIUM 8.9  --  9.1 8.6* 8.7*  CREATININE 0.73  --  0.65 0.63 0.64  GFRNONAA >60  --  >60 >60 >60  GFRAA >60  --  >60 >60 >60    LIVER FUNCTION TESTS:  Recent Labs  12/18/15 0630 12/21/15 0435 12/25/15 0450 12/28/15 0620  BILITOT 0.4 0.7 0.3 0.4  AST 21 23 21 26   ALT 11* 12* 12* 17  ALKPHOS 111 112 118 144*  PROT 6.7 6.2* 7.1 7.3  ALBUMIN 1.8* 1.8* 1.9* 2.0*    TUMOR MARKERS: No results for input(s): AFPTM, CEA, CA199, CHROMGRNA in the last 8760 hours.  Assessment and Plan:  Outpatient CT today demonstrates improvement in the bilateral subdiaphragmatic abdominal abscesses where the drains are located. There is significant daily output ranging from 20-40 mL exudative fluid. Abscesses are smaller.  additional lower abdominal abscesses are also smaller. One pelvic abscess has resolved.  Plan: Continue daily flushing and keep both subdiaphragmatic abscess drain catheters to external suction bulbs. Repeat outpatient CT in 2 weeks.  Electronically Signed: Greggory Keen 01/09/2016, 1:29 PM   I spent a total of    25 Minutes in face to face in clinical consultation, greater than 50% of which was counseling/coordinating care for this patient with postop abdominal abscesses and percutaneous  drains.

## 2016-01-24 ENCOUNTER — Other Ambulatory Visit: Payer: Self-pay | Admitting: Physician Assistant

## 2016-01-24 ENCOUNTER — Ambulatory Visit
Admission: RE | Admit: 2016-01-24 | Discharge: 2016-01-24 | Disposition: A | Discharge status: Home / Self Care | Payer: Medicaid Other | Source: Ambulatory Visit | Attending: General Surgery | Admitting: General Surgery

## 2016-01-24 ENCOUNTER — Ambulatory Visit
Admission: RE | Admit: 2016-01-24 | Discharge: 2016-01-24 | Disposition: A | Discharge status: Home / Self Care | Payer: Medicaid Other | Source: Ambulatory Visit | Attending: Interventional Radiology | Admitting: Interventional Radiology

## 2016-01-24 DIAGNOSIS — K651 Peritoneal abscess: Secondary | ICD-10-CM

## 2016-01-24 DIAGNOSIS — K750 Abscess of liver: Secondary | ICD-10-CM

## 2016-01-24 MED ORDER — IOPAMIDOL (ISOVUE-300) INJECTION 61%
100.0000 mL | Freq: Once | INTRAVENOUS | Status: AC | PRN
  Administered 2016-01-24: 100 mL via INTRAVENOUS

## 2016-01-24 NOTE — Progress Notes (Signed)
Chief Complaint: Abdominal Abscesses  Referring Physician(s): CCS  Supervising Physician: Markus Daft  History of Present Illness: [name] [name] is a 65 y.o. male  with multiple postoperative abdominal abscesses who returns for outpatient imaging and drain assessment.    Bilateral subdiaphragmatic abscesses have percutaneous drains.   He is currently staying in a facility and can't really tell me how much output there is, but he does state the nurses are emptying the drains every day.  Both drains are to a suction bulb.   He denies fevers. He denies any significant abdominal or flank pain.   He is tolerating a regular diet.   He does c/o some constipation but he reports he takes a stool softener and Miralax.  He states he he does not think the nurses are flushing the drains. He does seem to be a pretty good historian.  No past medical history on file.  Past Surgical History  Procedure Laterality Date  . Iliac artery stent  05/06/2011  . Tonsillectomy    . 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 Pr vein bypass graft,aorto-fem-pop  11/24/2012  . Inguinal hernia repair Right   . Abdominal aortagram N/A 11/11/2011    Procedure: ABDOMINAL Maxcine Ham;  Surgeon: Angelia Mould, MD;  Location: [facility] CATH LAB;  Service: Cardiovascular;  Laterality: N/A;  . Lower extremity angiogram Bilateral 11/11/2011    Procedure: LOWER EXTREMITY ANGIOGRAM;  Surgeon: Angelia Mould, MD;  Location: [facility] CATH LAB;  Service: Cardiovascular;  Laterality: Bilateral;  . Left heart catheterization with coronary angiogram N/A 06/07/2013    Procedure: LEFT HEART CATHETERIZATION WITH CORONARY ANGIOGRAM;  Surgeon: Sherren Mocha, MD;  Location: [facility] CATH LAB;  Service: Cardiovascular;  Laterality: N/A;  . Percutaneous coronary stent intervention  (pci-s) Right 06/07/2013    Procedure: PERCUTANEOUS CORONARY STENT INTERVENTION (PCI-S);  Surgeon: Sherren Mocha, MD;  Location: [facility] CATH LAB;  Service: Cardiovascular;  Laterality: Right;  . Left heart catheterization with coronary angiogram N/A 12/10/2013    Procedure: LEFT HEART CATHETERIZATION WITH CORONARY ANGIOGRAM;  Surgeon: Blane Ohara, MD;  Location: [facility] CATH LAB;  Service: Cardiovascular;  Laterality: N/A;  . Colonoscopy    . Peripheral vascular catheterization N/A 07/26/2015    Procedure: Abdominal Aortogram;  Surgeon: Serafina Mitchell, MD;  Location: [place] CV LAB;  Service: Cardiovascular;  Laterality: N/A;  . Peripheral vascular catheterization Bilateral 07/26/2015    Procedure: Lower Extremity Angiography;  Surgeon: Serafina Mitchell, MD;  Location: [place] CV LAB;  Service: Cardiovascular;  Laterality: Bilateral;  . Femoral-popliteal bypass graft Left 07/28/2015    Procedure: LEFT FEMORAL-POPLITEAL ARTERY BYPASS GRAFT;  Surgeon: Rosetta Posner, MD;  Location: [place];  Service: Vascular;  Laterality: Left;  . Laparotomy N/A 12/01/2015    Procedure: EXPLORATORY LAPAROTOMY WITH RADICAL PERITONEAL DEBRIDEMENT, CLOSURE OF SMALL BOWEL PERFORATION AND LYSIS OF ADHESIONS.;  Surgeon: Johnathan Hausen, MD;  Location: [facility];  Service: General;  Laterality: N/A;    Allergies: Zetia; Atorvastatin; Penicillins; Pravastatin sodium; Lisinopril; and Promethazine  Medications: Prior to Admission medications   Medication Sig Start Date End Date Taking? Authorizing Provider  acetaminophen (TYLENOL) 325 MG tablet Take 2 tablets (650 mg total) by mouth every 6 (six) hours as needed for fever. 12/29/15   Robbie Lis, MD  albuterol (PROVENTIL HFA;VENTOLIN HFA) [phone]  BASE) MCG/ACT inhaler Inhale 2 puffs into the lungs every 6 (six) hours as needed for wheezing or shortness of breath. 11/11/13   Ripudeep Krystal Eaton, MD  alum & mag hydroxide-simeth (MAALOX/MYLANTA) 200-200-20 MG/5ML suspension Take 30 mLs by  mouth every 6 (six) hours as needed for indigestion or heartburn (or bloating). 12/29/15   Robbie Lis, MD  antiseptic oral rinse (CPC / CETYLPYRIDINIUM CHLORIDE 0.05%) 0.05 % LIQD solution 7 mLs by Mouth Rinse route 2 times daily at 12 noon and 4 pm. 12/29/15   Robbie Lis, MD  bacitracin ointment Apply topically daily. 12/29/15   Robbie Lis, MD  bisacodyl (DULCOLAX) 10 MG suppository Place 1 suppository (10 mg total) rectally every 12 (twelve) hours as needed for mild constipation or moderate constipation. 12/29/15   Robbie Lis, MD  collagenase (SANTYL) ointment Apply thin layer to wound bed of left foot daily Patient taking differently: Apply 1 application topically daily. Apply thin layer to wound bed of left foot daily 10/26/15   Rosetta Posner, MD  diphenhydrAMINE (BENADRYL) 25 mg capsule Take 1 capsule (25 mg total) by mouth every 6 (six) hours as needed for itching, allergies or sleep (anxiety). 12/29/15   Robbie Lis, MD  feeding supplement, ENSURE ENLIVE, (ENSURE ENLIVE) LIQD Take 237 mLs by mouth 3 (three) times daily between meals. 12/29/15   Robbie Lis, MD  insulin aspart (NOVOLOG) 100 UNIT/ML injection Inject 0-15 Units into the skin 3 (three) times daily with meals. 12/29/15   Robbie Lis, MD  magnesium oxide (MAG-OX) 400 (241.3 Mg) MG tablet Take 1 tablet (400 mg total) by mouth 2 (two) times daily. 12/29/15   Robbie Lis, MD  methocarbamol (ROBAXIN) 500 MG tablet Take 2 tablets (1,000 mg total) by mouth 3 (three) times daily. 12/29/15   Robbie Lis, MD  Multiple Vitamins-Minerals (MULTIVITAMIN WITH MINERALS) tablet Take 4 tablets by mouth 3 (three) times daily.    Historical Provider, MD  oxyCODONE 20 MG TABS Take 1 tablet (20 mg total) by mouth every 4 (four) hours as needed for moderate pain. 12/29/15   Robbie Lis, MD  Oxycodone HCl 10 MG TABS Take two tablets by mouth every 4 hours as needed for pain 01/08/16   Tiffany L Reed, DO  thiamine 100 MG tablet Take 1 tablet (100 mg total)  by mouth daily. 12/29/15   Robbie Lis, MD  triamcinolone cream (KENALOG) 0.1 % Apply 1 application topically 2 (two) times daily as needed (for eczema).    Historical Provider, MD     Family History  Problem Relation Age of Onset  . Hypertension Mother   . Heart attack Mother   . Diabetes Mother   . Aneurysm Father   . Heart attack Sister   . Heart disease Brother     heart transplant    Social History   Social History  . Marital Status: Single    Spouse Name: N/A  . Number of Children: N/A  . Years of Education: N/A   Social History Main Topics  . Smoking status: Current Every Day Smoker -- 0.50 packs/day for 46 years    Types: Cigarettes  . Smokeless tobacco: Never Used  . Alcohol Use: 0.0 oz/week    0 Standard drinks or equivalent per week     Comment: [id number] "last drink was 3-4 yr ago; never had problem w/it"  . Drug Use: 1.00 per week    Special: Marijuana, Cocaine  Comment: stopped May 07, 2012  . Sexual Activity: Not Currently   Other Topics Concern  . Not on file   Social History Narrative      Review of Systems: A 12 point ROS discussed and pertinent positives are indicated in the HPI above.  All other systems are negative.  Review of Systems  Vital Signs: BP 135/85 mmHg  Pulse 90  Temp(Src) 97.8 F (36.6 C)  SpO2 98%  Physical Exam  Thin, NAD Awake, Alert and Oriented. Abdomen flat with midline scar. RUQ drain in place, intact.  LUQ drain in place, intact Both flush easily There is still purulent drainage in both bulbs.  There is about 5 mls in each.   Imaging: Ct Abdomen Pelvis W Contrast  01/09/2016  CLINICAL DATA:  Postop multiple intra-abdominal abscesses, status post percutaneous aspirations and 2 percutaneous drains. Subsequent encounter as an outpatient. EXAM: CT ABDOMEN AND PELVIS WITH CONTRAST TECHNIQUE: Multidetector CT imaging of the abdomen and pelvis was performed using the standard protocol following bolus  administration of intravenous contrast. CONTRAST:  16mL ISOVUE-300 IOPAMIDOL (ISOVUE-300) INJECTION 61% COMPARISON:  12/25/2015 FINDINGS: Lower chest: Pleural effusions are much smaller with improvement in the bibasilar atelectasis as well. Normal heart size. No pericardial effusion. Hepatobiliary: Right upper quadrant perihepatic subdiaphragmatic abscess is smaller. Stable drain catheter position. Measuring at a similar location the residual right upper quadrant abscess measures 6.2 x 1.9 cm, previously 9.7 x 1.8 cm. No other focal hepatic abnormality or biliary dilatation. Patent portal vein. Gallbladder and biliary system unremarkable. Pancreas: No mass, inflammatory changes, or other significant abnormality. Spleen: Normal in size and appearance. Left upper quadrant subdiaphragmatic perisplenic drain is stable in position. There is improvement in this abscess as well. Measuring at a similar level, the residual left upper quadrant abscess measure 6.1 x 2.2 cm, previously 10.2 x 2.2 cm. Adrenals/Urinary Tract: Normal adrenal glands. Stable bilateral renal cysts. No hydronephrosis or obstruction. Stomach/Bowel: Negative for bowel obstruction, significant dilatation, or ileus. No free air evident. Right abdominal pericolic gutter small peripheral enhancing abscess is smaller measuring 22 x 11 mm, previously 46 x 27 mm. A small left mid abdominal residual abscess is smaller measuring 24 x 12 mm, previously 76 x 15 mm. No new abdominal collections. Small right pelvic collection has resolved. Vascular/Lymphatic: No adenopathy. Postop changes of the aorto bi femoral bypass again noted. Reproductive: No mass or other significant abnormality. Other: No inguinal abnormality or hernia.  Intact abdominal wall. Musculoskeletal: Degenerative changes of the spine. No acute osseous finding. IMPRESSION: Interval improvement in the bilateral subdiaphragmatic abscesses. Stable drain catheter positions. small residual abscesses  remain at both sites. Drain catheters will remain to external suction balls. Small bilateral abdominal abscesses continue to resolve. Small right pelvic abscess has resolved. No new collections demonstrated. Improvement in the pleural effusions and bibasilar atelectasis Negative for bowel obstruction or free air. Electronically Signed   By: Jerilynn Mages.  Shick M.D.   On: 01/09/2016 11:39   Ct Abdomen Pelvis W Contrast  12/25/2015  CLINICAL DATA:  Abscess of abdominal cavity. EXAM: CT ABDOMEN AND PELVIS WITH CONTRAST TECHNIQUE: Multidetector CT imaging of the abdomen and pelvis was performed using the standard protocol following bolus administration of intravenous contrast. CONTRAST:  149mL OMNIPAQUE IOHEXOL 300 MG/ML  SOLN COMPARISON:  CT scan of December 14, 2015 and drainage procedure of December 15, 2015. FINDINGS: Severe degenerative disc disease is noted at L5-S1. Bilateral pleural effusions are noted with adjacent subsegmental atelectasis, with left greater than right. No  gallstones are noted. Right perihepatic crescent shaped fluid collection is significantly smaller compared to prior exam status post placement of drainage catheter, which is seen over the dome of right hepatic lobe. It currently measures 9.7 x 1.8 cm. Drainage catheter seen in left perisplenic fluid collection is unchanged in position, with no significant change in the size of this fluid collection pancreas appears normal. Bilateral renal cysts are unchanged. Adrenal glands appear normal. No hydronephrosis or renal obstruction is noted. Drainage catheter noted in right lateral fluid collection on prior study has been removed. 4.6 x 2.8 cm residual fluid collection is noted. Left lateral peritoneal fluid collection is noted which measures 7.6 x 1.5 cm which may be slightly smaller compared to prior exam. There appears to be no evidence of bowel obstruction. Large amount of stool is noted in the sigmoid colon and rectum. Urinary bladder appears normal.  Multiple other smaller fluid collections are noted posteriorly in the pelvis, with the largest measuring 3.1 x 1.4 cm. IMPRESSION: Stable bilateral pleural effusions are noted with adjacent subsegmental atelectasis, left greater than right. Right para Padda crescent shaped fluid collection is significantly smaller compared to prior exam, status post placement of new drainage catheter, tip of which is seen over dome of right hepatic lobe. No change seen in position of drainage catheter within left perisplenic fluid collection, which is unchanged in size compared to prior exam. There is been interval removal of drainage catheter from right lateral fluid collection noted on prior exam, with residual fluid collection measuring 4.6 x 2.8 cm which is not significantly changed compared to prior exam. Left lateral peritoneal fluid collection is noted measuring 7.6 x 1.5 cm which may be slightly smaller compared to prior exam. There are noted multiple smaller and probably loculated fluid collections seen posteriorly in the pelvis, with the largest single collection measuring 3.1 x 1.4 cm. Electronically Signed   By: Marijo Conception, M.D.   On: 12/25/2015 16:33   Ct Image Guided Drainage Percut Cath  Peritoneal Retroperit  12/27/2015  INDICATION: Abdominal abscess on the right and left flanks EXAM: CT GUIDED DRAINAGE OF BILATERAL ABSCESS MEDICATIONS: The patient is currently admitted to the hospital and receiving intravenous antibiotics. The antibiotics were administered within an appropriate time frame prior to the initiation of the procedure. ANESTHESIA/SEDATION: 0.5 mg IV Versed 25 mcg IV Fentanyl Moderate Sedation Time:  13 The patient was continuously monitored during the procedure by the interventional radiology nurse under my direct supervision. COMPLICATIONS: None immediate. TECHNIQUE: Informed written consent was obtained from the patient after a thorough discussion of the procedural risks, benefits and  alternatives. All questions were addressed. Maximal Sterile Barrier Technique was utilized including caps, mask, sterile gowns, sterile gloves, sterile drape, hand hygiene and skin antiseptic. A timeout was performed prior to the initiation of the procedure. PROCEDURE: The abdominal region was prepped with ChloraPrep in a sterile fashion, and a sterile drape was applied covering the operative field. A sterile gown and sterile gloves were used for the procedure. Local anesthesia was provided with 1% Lidocaine. Under CT guidance, a Yueh Angiocath was inserted into the left flank abscess. 5 cc of pus was aspirated. Under CT guidance, a Teressa Lower Angiocath was placed in the right flank abscess and an additional 5 cc of pus was aspirated. Samples were sent for culture. FINDINGS: Images document you we Angiocath placement in the right and left flank abscesses. Post aspiration imaging demonstrates no hemorrhage and some improvement in the size of the abscesses.  IMPRESSION: Successful right and left pelvic abscess drainage utilizing UE Angiocath. Electronically Signed   By: Marybelle Killings M.D.   On: 12/27/2015 16:13   Ct Image Guided Drainage Percut Cath  Peritoneal Retroperit  12/27/2015  INDICATION: Abdominal abscess on the right and left flanks EXAM: CT GUIDED DRAINAGE OF BILATERAL ABSCESS MEDICATIONS: The patient is currently admitted to the hospital and receiving intravenous antibiotics. The antibiotics were administered within an appropriate time frame prior to the initiation of the procedure. ANESTHESIA/SEDATION: 0.5 mg IV Versed 25 mcg IV Fentanyl Moderate Sedation Time:  13 The patient was continuously monitored during the procedure by the interventional radiology nurse under my direct supervision. COMPLICATIONS: None immediate. TECHNIQUE: Informed written consent was obtained from the patient after a thorough discussion of the procedural risks, benefits and alternatives. All questions were addressed. Maximal Sterile  Barrier Technique was utilized including caps, mask, sterile gowns, sterile gloves, sterile drape, hand hygiene and skin antiseptic. A timeout was performed prior to the initiation of the procedure. PROCEDURE: The abdominal region was prepped with ChloraPrep in a sterile fashion, and a sterile drape was applied covering the operative field. A sterile gown and sterile gloves were used for the procedure. Local anesthesia was provided with 1% Lidocaine. Under CT guidance, a Yueh Angiocath was inserted into the left flank abscess. 5 cc of pus was aspirated. Under CT guidance, a Teressa Lower Angiocath was placed in the right flank abscess and an additional 5 cc of pus was aspirated. Samples were sent for culture. FINDINGS: Images document you we Angiocath placement in the right and left flank abscesses. Post aspiration imaging demonstrates no hemorrhage and some improvement in the size of the abscesses. IMPRESSION: Successful right and left pelvic abscess drainage utilizing UE Angiocath. Electronically Signed   By: Marybelle Killings M.D.   On: 12/27/2015 16:13    Labs:  CBC:  Recent Labs  12/25/15 0450 12/26/15 0415 12/29/15 0502 12/30/15 0614  WBC 14.1* 12.5* 12.0* 10.8*  HGB 7.8* 8.0* 7.6* 7.7*  HCT 25.4* 25.8* 24.7* 24.3*  PLT 459* 226 315 332    COAGS:  Recent Labs  07/26/15 1415 12/06/15 0925 12/08/15 0340 12/27/15 0415  INR 1.09 1.89* 1.82* 1.28  APTT 37 45*  --   --     BMP:  Recent Labs  12/25/15 0450 12/27/15 0940 12/28/15 0620 12/29/15 0502 12/30/15 0614  NA 135  --  131* 130* 131*  K 5.1 4.8 4.6 4.8 4.7  CL 95*  --  92* 92* 94*  CO2 32  --  32 33* 32  GLUCOSE 157*  --  169* 163* 179*  BUN 20  --  18 17 18   CALCIUM 8.9  --  9.1 8.6* 8.7*  CREATININE 0.73  --  0.65 0.63 0.64  GFRNONAA >60  --  >60 >60 >60  GFRAA >60  --  >60 >60 >60    LIVER FUNCTION TESTS:  Recent Labs  12/18/15 0630 12/21/15 0435 12/25/15 0450 12/28/15 0620  BILITOT 0.4 0.7 0.3 0.4  AST 21 23 21  26   ALT 11* 12* 12* 17  ALKPHOS 111 112 118 144*  PROT 6.7 6.2* 7.1 7.3  ALBUMIN 1.8* 1.8* 1.9* 2.0*    TUMOR MARKERS: No results for input(s): AFPTM, CEA, CA199, CHROMGRNA in the last 8760 hours.  Assessment:  S/P Right and Left upper quadrant drains.  Both abscesses are smaller on CT today.    There continues to be purulent output from both, unfortunately we cannot quantitate  this as we do not have the output recorded.  I have written instructions for the Nurses at [facility] to flush both drains daily with Normal Saline and to empty and record the output.  *NOTE* The LUQ drain does pass through the base of the lung/pleura. Therefore, this drain will need to be removed in the hospital setting and a post-removal chest X-ray done to evaluate for pneumothorax.  Will have patient follow up in 1 week at [facility].  At that time, depending on the recorded output, the LUQ drain could possibly be removed.    The RUQ drain may require injection to evaluate the cavity.  I have placed an order in EPIC for this visit.  Electronically Signed: Murrell Redden PA-C 01/24/2016, 11:12 AM   Please refer to Dr. Moises Blood attestation of this note for management and plan.

## 2016-01-25 ENCOUNTER — Encounter: Payer: Self-pay | Admitting: Internal Medicine

## 2016-01-25 ENCOUNTER — Non-Acute Institutional Stay (SKILLED_NURSING_FACILITY): Payer: Medicaid Other | Admitting: Internal Medicine

## 2016-01-25 DIAGNOSIS — K631 Perforation of intestine (nontraumatic): Secondary | ICD-10-CM | POA: Diagnosis not present

## 2016-01-25 DIAGNOSIS — G629 Polyneuropathy, unspecified: Secondary | ICD-10-CM | POA: Diagnosis not present

## 2016-01-25 DIAGNOSIS — F39 Unspecified mood [affective] disorder: Secondary | ICD-10-CM

## 2016-01-25 DIAGNOSIS — F329 Major depressive disorder, single episode, unspecified: Secondary | ICD-10-CM | POA: Diagnosis not present

## 2016-01-25 DIAGNOSIS — F32A Depression, unspecified: Secondary | ICD-10-CM

## 2016-01-25 NOTE — Progress Notes (Signed)
MRN: [id number] Name: [name] [name]  Sex: male Age: 65 y.o. DOB: 1951/07/26  [name]: Karren Burly Facility/Room: Level Of Care: SNF Provider: Inocencio Homes D Emergency Contacts: Extended Emergency Contact Information Primary Emergency Contact: Lance Coon [place] of [place] Mobile Phone: [phone] Relation: Brother Secondary Emergency Contact: Linton Ham Address: [address]          [place], [place] [address] Johnnette Litter of [place] Phone: [phone] Relation: Friend  Code Status:   Allergies: Zetia; Atorvastatin; Penicillins; Pravastatin sodium; Lisinopril; and Promethazine  Chief Complaint  Patient presents with  . Medical Management of Chronic Issues    HPI: Patient is 65 y.o. male who is being seen for routine issues of s/p SBO and surgery, neuropathy and depression.  History reviewed. No pertinent past medical history.  Past Surgical History  Procedure Laterality Date  . Iliac artery stent  05/06/2011  . Tonsillectomy    . Aorta - bilateral femoral artery bypass graft  11/25/2011    Procedure: AORTA BIFEMORAL BYPASS GRAFT;  Surgeon: Rosetta Posner, MD;  Location: [place];  Service: Vascular;  Laterality: N/A;  . Pr vein bypass graft,aorto-fem-pop  11/24/2012  . Inguinal hernia repair Right   . Abdominal aortagram N/A 11/11/2011    Procedure: ABDOMINAL Maxcine Ham;  Surgeon: Angelia Mould, MD;  Location: [facility] CATH LAB;  Service: Cardiovascular;  Laterality: N/A;  . Lower extremity angiogram Bilateral 11/11/2011    Procedure: LOWER EXTREMITY ANGIOGRAM;  Surgeon: Angelia Mould, MD;  Location: [facility] CATH LAB;  Service: Cardiovascular;  Laterality: Bilateral;  . Left heart catheterization with coronary angiogram N/A 06/07/2013    Procedure: LEFT HEART CATHETERIZATION WITH CORONARY ANGIOGRAM;  Surgeon: Sherren Mocha, MD;  Location: [facility] CATH LAB;  Service: Cardiovascular;  Laterality: N/A;  . Percutaneous coronary stent  intervention (pci-s) Right 06/07/2013    Procedure: PERCUTANEOUS CORONARY STENT INTERVENTION (PCI-S);  Surgeon: Sherren Mocha, MD;  Location: [facility] CATH LAB;  Service: Cardiovascular;  Laterality: Right;  . Left heart catheterization with coronary angiogram N/A 12/10/2013    Procedure: LEFT HEART CATHETERIZATION WITH CORONARY ANGIOGRAM;  Surgeon: Blane Ohara, MD;  Location: [facility] CATH LAB;  Service: Cardiovascular;  Laterality: N/A;  . Colonoscopy    . Peripheral vascular catheterization N/A 07/26/2015    Procedure: Abdominal Aortogram;  Surgeon: Serafina Mitchell, MD;  Location: [place] CV LAB;  Service: Cardiovascular;  Laterality: N/A;  . Peripheral vascular catheterization Bilateral 07/26/2015    Procedure: Lower Extremity Angiography;  Surgeon: Serafina Mitchell, MD;  Location: [place] CV LAB;  Service: Cardiovascular;  Laterality: Bilateral;  . Femoral-popliteal bypass graft Left 07/28/2015    Procedure: LEFT FEMORAL-POPLITEAL ARTERY BYPASS GRAFT;  Surgeon: Rosetta Posner, MD;  Location: [place];  Service: Vascular;  Laterality: Left;  . Laparotomy N/A 12/01/2015    Procedure: EXPLORATORY LAPAROTOMY WITH RADICAL PERITONEAL DEBRIDEMENT, CLOSURE OF SMALL BOWEL PERFORATION AND LYSIS OF ADHESIONS.;  Surgeon: Johnathan Hausen, MD;  Location: [facility];  Service: General;  Laterality: N/A;      Medication List       This list is accurate as of: 01/25/16 11:59 PM.  Always use your most recent med list.               acetaminophen 325 MG tablet  Commonly known as:  TYLENOL  Take 2 tablets (650 mg total) by mouth every 6 (six) hours as needed for fever.     albuterol 108 (  90 Base) MCG/ACT inhaler  Commonly known as:  PROVENTIL HFA;VENTOLIN HFA  Inhale 2 puffs into the lungs every 6 (six) hours as needed for wheezing or shortness of breath.     alum & mag hydroxide-simeth 200-200-20 MG/5ML suspension  Commonly known as:  MAALOX/MYLANTA  Take 30 mLs by mouth every 6 (six) hours as needed for  indigestion or heartburn (or bloating).     antiseptic oral rinse 0.05 % Liqd solution  Commonly known as:  CPC / CETYLPYRIDINIUM CHLORIDE 0.05%  7 mLs by Mouth Rinse route 2 times daily at 12 noon and 4 pm.     bacitracin ointment  Apply topically daily.     bisacodyl 10 MG suppository  Commonly known as:  DULCOLAX  Place 1 suppository (10 mg total) rectally every 12 (twelve) hours as needed for mild constipation or moderate constipation.     collagenase ointment  Commonly known as:  SANTYL  Apply thin layer to wound bed of left foot daily     diphenhydrAMINE 25 mg capsule  Commonly known as:  BENADRYL  Take 1 capsule (25 mg total) by mouth every 6 (six) hours as needed for itching, allergies or sleep (anxiety).     feeding supplement (ENSURE ENLIVE) Liqd  Take 237 mLs by mouth 3 (three) times daily between meals.     insulin aspart 100 UNIT/ML injection  Commonly known as:  novoLOG  Inject 0-15 Units into the skin 3 (three) times daily with meals.     magnesium oxide 400 (241.3 Mg) MG tablet  Commonly known as:  MAG-OX  Take 1 tablet (400 mg total) by mouth 2 (two) times daily.     methocarbamol 500 MG tablet  Commonly known as:  ROBAXIN  Take 2 tablets (1,000 mg total) by mouth 3 (three) times daily.     multivitamin with minerals tablet  Take 4 tablets by mouth 3 (three) times daily.     Oxycodone HCl 20 MG Tabs  Take 1 tablet (20 mg total) by mouth every 4 (four) hours as needed for moderate pain.     Oxycodone HCl 10 MG Tabs  Take two tablets by mouth every 4 hours as needed for pain     thiamine 100 MG tablet  Take 1 tablet (100 mg total) by mouth daily.     triamcinolone cream 0.1 %  Commonly known as:  KENALOG  Apply 1 application topically 2 (two) times daily as needed (for eczema).        No orders of the defined types were placed in this encounter.    Immunization History  Administered Date(s) Administered  . Influenza,inj,Quad PF,36+ Mos  11/11/2013  . Pneumococcal Polysaccharide-23 11/12/2011    Social History  Substance Use Topics  . Smoking status: Current Every Day Smoker -- 0.50 packs/day for 46 years    Types: Cigarettes  . Smokeless tobacco: Never Used  . Alcohol Use: 0.0 oz/week    0 Standard drinks or equivalent per week     Comment: [id number]/17/[id number] "last drink was 3-4 yr ago; never had problem w/it"    Review of Systems  DATA OBTAINED: from patient - has a + ROS; nurse with no new concerns GENERAL:  no fevers, fatigue, appetite changes SKIN: No itching, rash; c/o wound on foot and toe HEENT: No complaint RESPIRATORY: No cough, wheezing, SOB CARDIAC: No chest pain, palpitations, lower extremity edema  GI: No abdominal pain, No N/V/D or constipation, No heartburn or reflux  GU: No dysuria,  frequency or urgency, or incontinence  MUSCULOSKELETAL: No unrelieved bone/joint pain NEUROLOGIC: No headache, dizziness  PSYCHIATRIC: No overt anxiety or sadness  Filed Vitals:   01/28/16 2114  BP: 136/82  Pulse: 70  Temp: 98.7 F (37.1 C)  Resp: 18    Physical Exam  GENERAL APPEARANCE: Alert, conversant, No acute distress  SKIN: No diaphoresis rash; wound on dorsum of foot healed; tiny abrasion R great toe HEENT: Unremarkable RESPIRATORY: Breathing is even, unlabored. Lung sounds are clear   CARDIOVASCULAR: Heart RRR no murmurs, rubs or gallops. No peripheral edema  GASTROINTESTINAL: Abdomen is soft, non-tender, not distended w/ normal bowel sounds; 2 drains  GENITOURINARY: Bladder non tender, not distended  MUSCULOSKELETAL: No abnormal joints or musculature NEUROLOGIC: Cranial nerves 2-12 grossly intact. Moves all extremities PSYCHIATRIC: not pleasant, not happy, no behavioral issues  Patient Active Problem List   Diagnosis Date Noted  . Mood disorder ([place]) 01/28/2016  . Depression 01/28/2016  . Acute blood loss anemia 01/06/2016  . Polyneuropathy ([place]) 01/06/2016  . Hypomagnesemia 12/16/2015  .  Demand ischemia on 1/20 from acute blood loss anemia ([place]) 12/16/2015  . Abdominal wall fluid collections   . Acute encephalopathy 12/12/2015  . Seizures ([place]) 12/12/2015  . Acute kidney injury ([place]) 12/12/2015  . Controlled diabetes mellitus with circulatory complication, without long-term current use of insulin ([place]) 12/12/2015  . Anemia of chronic disease 12/12/2015  . Sepsis ([place]) 12/12/2015  . Leukocytosis 12/12/2015  . Thrombocytopenia ([place]) 12/12/2015  . Palliative care encounter   . Acute respiratory failure with hypoxemia ([place]) 12/06/2015  . DVT of axillary vein, acute right   . Small bowel obstruction & perforation s/p OR exploration & repair 12/01/2015 12/02/2015  . Purulent peritonitis ([place]) 12/02/2015  . Protein-calorie malnutrition, severe ([place]) 07/30/2015    CBC    Component Value Date/Time   WBC 10.8* 12/30/2015 0614   WBC 5.9 07/28/2009 1533   RBC 2.55* 12/30/2015 0614   RBC 3.98* 07/28/2009 1533   HGB 7.7* 12/30/2015 0614   HGB 12.5* 07/28/2009 1533   HCT 24.3* 12/30/2015 0614   HCT 37.1* 07/28/2009 1533   PLT 332 12/30/2015 0614   PLT 145 07/28/2009 1533   MCV 95.3 12/30/2015 0614   MCV 93.3 07/28/2009 1533   LYMPHSABS 1.2 12/25/2015 0450   LYMPHSABS 2.4 07/28/2009 1533   MONOABS 1.5* 12/25/2015 0450   MONOABS 0.4 07/28/2009 1533   EOSABS 0.1 12/25/2015 0450   EOSABS 0.2 07/28/2009 1533   BASOSABS 0.1 12/25/2015 0450   BASOSABS 0.1 07/28/2009 1533    CMP     Component Value Date/Time   NA 131* 12/30/2015 0614   K 4.7 12/30/2015 0614   CL 94* 12/30/2015 0614   CO2 32 12/30/2015 0614   GLUCOSE 179* 12/30/2015 0614   BUN 18 12/30/2015 0614   CREATININE 0.64 12/30/2015 0614   CALCIUM 8.7* 12/30/2015 0614   PROT 7.3 12/28/2015 0620   ALBUMIN 2.0* 12/28/2015 0620   AST 26 12/28/2015 0620   ALT 17 12/28/2015 0620   ALKPHOS 144* 12/28/2015 0620   BILITOT 0.4 12/28/2015 0620   GFRNONAA >60 12/30/2015 0614   GFRAA >60 12/30/2015 0614     Assessment and Plan  Small bowel obstruction & perforation s/p OR exploration & repair 12/01/2015 Pt still with 2 drains draining serosanguinous fluid; f/u surgery as outpt  Polyneuropathy ([place]) Pt back on neurontin 300 mg TID; pt without c/o  Depression Pt is very difficult to deal with; placed on remeron 15 mg qHS;  will monitor    Hennie Duos, MD

## 2016-01-28 ENCOUNTER — Encounter: Payer: Self-pay | Admitting: Internal Medicine

## 2016-01-28 DIAGNOSIS — F39 Unspecified mood [affective] disorder: Secondary | ICD-10-CM | POA: Insufficient documentation

## 2016-01-28 DIAGNOSIS — F329 Major depressive disorder, single episode, unspecified: Secondary | ICD-10-CM | POA: Insufficient documentation

## 2016-01-28 DIAGNOSIS — F32A Depression, unspecified: Secondary | ICD-10-CM | POA: Insufficient documentation

## 2016-01-28 NOTE — Assessment & Plan Note (Signed)
Pt back on neurontin 300 mg TID; pt without c/o

## 2016-01-28 NOTE — Assessment & Plan Note (Signed)
Pt still with 2 drains draining serosanguinous fluid; f/u surgery as outpt

## 2016-01-28 NOTE — Assessment & Plan Note (Signed)
Pt is very difficult to deal with; placed on remeron 15 mg qHS; will monitor

## 2016-01-29 LAB — BASIC METABOLIC PANEL: Glucose: 176 mg/dL

## 2016-01-31 ENCOUNTER — Encounter: Payer: Self-pay | Admitting: Adult Health

## 2016-01-31 ENCOUNTER — Non-Acute Institutional Stay (SKILLED_NURSING_FACILITY): Payer: Medicaid Other | Admitting: Adult Health

## 2016-01-31 DIAGNOSIS — G629 Polyneuropathy, unspecified: Secondary | ICD-10-CM

## 2016-01-31 DIAGNOSIS — K631 Perforation of intestine (nontraumatic): Secondary | ICD-10-CM | POA: Diagnosis not present

## 2016-01-31 DIAGNOSIS — E1159 Type 2 diabetes mellitus with other circulatory complications: Secondary | ICD-10-CM | POA: Diagnosis not present

## 2016-01-31 NOTE — Progress Notes (Signed)
Patient ID: [name] [name], male   DOB: 26-Jul-1951, 65 y.o.   MRN: [id number]   Facility:  [facility]       Allergies  Allergen Reactions  . Zetia [Ezetimibe] Anaphylaxis and Swelling    Tongue and throat   . Atorvastatin Other (See Comments)    Malaise & muscle weakness  . Penicillins Other (See Comments)    Unknown.  Tolerates cefepime  . Pravastatin Sodium     Pravastatin 40 mg qday and 40 mg q M/W/F caused muscle aches  . Lisinopril Rash  . Promethazine Rash    Chief Complaint  Patient presents with  . Discharge Note    Discharge from facility    HPI:   He is being discharged to home with home health for pt/ot/rn. He will not need any dme. He will need his prescriptions to be written and will need to have a follow up set up for him.     History reviewed. No pertinent past medical history.  Past Surgical History  Procedure Laterality Date  . Iliac artery stent  05/06/2011  . Tonsillectomy    . Aorta - bilateral femoral artery bypass graft  11/25/2011    Procedure: AORTA BIFEMORAL BYPASS GRAFT;  Surgeon: Rosetta Posner, MD;  Location: [place];  Service: Vascular;  Laterality: N/A;  . Pr vein bypass graft,aorto-fem-pop  11/24/2012  . Inguinal hernia repair Right   . Abdominal aortagram N/A 11/11/2011    Procedure: ABDOMINAL Maxcine Ham;  Surgeon: Angelia Mould, MD;  Location: [facility] CATH LAB;  Service: Cardiovascular;  Laterality: N/A;  . Lower extremity angiogram Bilateral 11/11/2011    Procedure: LOWER EXTREMITY ANGIOGRAM;  Surgeon: Angelia Mould, MD;  Location: [facility] CATH LAB;  Service: Cardiovascular;  Laterality: Bilateral;  . Left heart catheterization with coronary angiogram N/A 06/07/2013    Procedure: LEFT HEART CATHETERIZATION WITH CORONARY ANGIOGRAM;  Surgeon: Sherren Mocha, MD;  Location: [facility] C[facility] LAB;  Service: Cardiovascular;  Laterality: N/A;  . Percutaneous coronary stent intervention (pci-s) Right 06/07/2013    Procedure: PERCUTANEOUS CORONARY  STENT INTERVENTION (PCI-S);  Surgeon: Sherren Mocha, MD;  Location: [facility] CATH LAB;  Service: Cardiovascular;  Laterality: Right;  . Left heart catheterization with coronary angiogram N/A 12/10/2013    Procedure: LEFT HEART CATHETERIZATION WITH CORONARY ANGIOGRAM;  Surgeon: Blane Ohara, MD;  Location: [facility] CATH LAB;  Service: Cardiovascular;  Laterality: N/A;  . Colonoscopy    . Peripheral vascular catheterization N/A 07/26/2015    Procedure: Abdominal Aortogram;  Surgeon: Serafina Mitchell, MD;  Location: [place] CV LAB;  Service: Cardiovascular;  Laterality: N/A;  . Peripheral vascular catheterization Bilateral 07/26/2015    Procedure: Lower Extremity Angiography;  Surgeon: Serafina Mitchell, MD;  Location: [place] CV LAB;  Service: Cardiovascular;  Laterality: Bilateral;  . Femoral-popliteal bypass graft Left 07/28/2015    Procedure: LEFT FEMORAL-POPLITEAL ARTERY BYPASS GRAFT;  Surgeon: Rosetta Posner, MD;  Location: [place];  Service: Vascular;  Laterality: Left;  . Laparotomy N/A 12/01/2015    Procedure: EXPLORATORY LAPAROTOMY WITH RADICAL PERITONEAL DEBRIDEMENT, CLOSURE OF SMALL BOWEL PERFORATION AND LYSIS OF ADHESIONS.;  Surgeon: Johnathan Hausen, MD;  Location: [facility];  Service: General;  Laterality: N/A;    VITAL SIGNS BP 136/82 mmHg  Pulse 70  Temp(Src) 98.7 F (37.1 C) (Oral)  Resp 18  Ht 5\' 10"  (1.778 m)  Wt 121 lb (54.885 kg)  BMI 17.36 kg/m2  Patient's Medications  New Prescriptions   No medications on file  Previous  Medications   ACETAMINOPHEN (TYLENOL) 325 MG TABLET    Take 2 tablets (650 mg total) by mouth every 6 (six) hours as needed for fever.   ALBUTEROL (PROVENTIL HFA;VENTOLIN HFA) 108 (90 BASE) MCG/ACT INHALER    Inhale 2 puffs into the lungs every 6 (six) hours as needed for wheezing or shortness of breath.   ALUM & MAG HYDROXIDE-SIMETH (MAALOX/MYLANTA) 200-200-20 MG/5ML SUSPENSION    Take 30 mLs by mouth every 6 (six) hours as needed for indigestion or heartburn (or  bloating).   ANTISEPTIC ORAL RINSE (CPC / CETYLPYRIDINIUM CHLORIDE 0.05%) 0.05 % LIQD SOLUTION    7 mLs by Mouth Rinse route 2 times daily at 12 noon and 4 pm.   BISACODYL (DULCOLAX) 10 MG SUPPOSITORY    Place 1 suppository (10 mg total) rectally every 12 (twelve) hours as needed for mild constipation or moderate constipation.   COLLAGENASE (SANTYL) OINTMENT    Apply thin layer to wound bed of left foot daily   DIPHENHYDRAMINE (BENADRYL) 25 MG CAPSULE    Take 1 capsule (25 mg total) by mouth every 6 (six) hours as needed for itching, allergies or sleep (anxiety).   FEEDING SUPPLEMENT, ENSURE ENLIVE, (ENSURE ENLIVE) LIQD    Take 237 mLs by mouth 3 (three) times daily between meals.   GABAPENTIN (NEURONTIN) 300 MG CAPSULE    Take 300 mg by mouth 3 (three) times daily.   INSULIN ASPART (NOVOLOG) 100 UNIT/ML INJECTION    Inject 0-15 Units into the skin 3 (three) times daily with meals.   MAGNESIUM OXIDE (MAG-OX) 400 (241.3 MG) MG TABLET    Take 1 tablet (400 mg total) by mouth 2 (two) times daily.   METHOCARBAMOL (ROBAXIN) 500 MG TABLET    Take 2 tablets (1,000 mg total) by mouth 3 (three) times daily.   MIRTAZAPINE (REMERON) 15 MG TABLET    Take 15 mg by mouth at bedtime.   OXYCODONE 20 MG TABS    Take 1 tablet (20 mg total) by mouth every 4 (four) hours as needed for moderate pain.   OXYCODONE HCL 10 MG TABS    Take two tablets by mouth every 4 hours as needed for pain   SENNOSIDES (SENNA-LAX PO)    Take 2 tablets by mouth daily.   THIAMINE 100 MG TABLET    Take 1 tablet (100 mg total) by mouth daily.   TRIAMCINOLONE CREAM (KENALOG) 0.1 %    Apply 1 application topically 2 (two) times daily as needed (for eczema).   VITAMIN C (ASCORBIC ACID) 500 MG TABLET    Take 500 mg by mouth 2 (two) times daily.   ZINC SULFATE 220 MG CAPSULE    Take 220 mg by mouth daily. X 45 days until 02/23/16  Modified Medications   No medications on file  Discontinued Medications   BACITRACIN OINTMENT    Apply topically  daily.   MULTIPLE VITAMINS-MINERALS (MULTIVITAMIN WITH MINERALS) TABLET    Take 4 tablets by mouth 3 (three) times daily. Reported on 01/31/2016     SIGNIFICANT DIAGNOSTIC EXAMS   Review of Systems  Constitutional: Negative for malaise/fatigue.  Respiratory: Negative for cough and shortness of breath.   Cardiovascular: Negative for chest pain, palpitations and leg swelling.  Gastrointestinal: Negative for heartburn, abdominal pain and constipation.  Musculoskeletal: Negative for myalgias, back pain and joint pain.  Skin: Negative.   Neurological: Negative for dizziness.  Psychiatric/Behavioral: The patient is not nervous/anxious.     Physical Exam  Constitutional: He is  oriented to person, place, and time. No distress.  Eyes: Conjunctivae are normal.  Neck: Neck supple. No JVD present. No thyromegaly present.  Cardiovascular: Normal rate, regular rhythm and intact distal pulses.   Respiratory: Effort normal and breath sounds normal. No respiratory distress. He has no wheezes.  GI: Soft. Bowel sounds are normal. He exhibits no distension. There is no tenderness.  Has ileostomy   Musculoskeletal: He exhibits no edema.  Able to move all extremities   Lymphadenopathy:    He has no cervical adenopathy.  Neurological: He is alert and oriented to person, place, and time.  Skin: Skin is warm and dry. He is not diaphoretic.  Has jp drain    Psychiatric: He has a normal mood and affect.      ASSESSMENT/ PLAN:  Will discharge him to home with home health for pt/ot/rn to evaluate and treat as indicated for gait balance adl training rn ofr ileostomy education jp drain and left great toe wound. His prescriptions have been written for a 30 day supply of his medications with #30 oxycodone 20 mg tabs. He will not need dme. The facility is to setup a follow up appointment within th next 1-2 weeks.    Time spent with patient  40  minutes >50% time spent counseling; reviewing medical record;  tests; labs; and developing future plan of care   Ok Edwards NP [facility]  Contact [phone] Monday through Friday 8am- 5pm  After hours call [phone]

## 2016-02-07 DIAGNOSIS — I10 Essential (primary) hypertension: Secondary | ICD-10-CM | POA: Diagnosis not present

## 2016-02-07 DIAGNOSIS — E1142 Type 2 diabetes mellitus with diabetic polyneuropathy: Secondary | ICD-10-CM | POA: Diagnosis not present

## 2016-02-07 DIAGNOSIS — G8929 Other chronic pain: Secondary | ICD-10-CM | POA: Diagnosis not present

## 2016-02-07 DIAGNOSIS — K6811 Postprocedural retroperitoneal abscess: Secondary | ICD-10-CM | POA: Diagnosis not present

## 2016-02-07 DIAGNOSIS — I82411 Acute embolism and thrombosis of right femoral vein: Secondary | ICD-10-CM | POA: Diagnosis not present

## 2016-02-07 DIAGNOSIS — E1159 Type 2 diabetes mellitus with other circulatory complications: Secondary | ICD-10-CM | POA: Diagnosis not present

## 2016-02-09 ENCOUNTER — Telehealth: Payer: Self-pay | Admitting: *Deleted

## 2016-02-09 NOTE — Telephone Encounter (Signed)
Danielle with [facility] called and requested verbal orders for PT 2x5weeks. Instructed her to call patient's Primary Dr. For orders due to patient being discharged from facility.

## 2016-02-13 ENCOUNTER — Encounter (HOSPITAL_COMMUNITY): Payer: Self-pay | Admitting: Emergency Medicine

## 2016-02-13 DIAGNOSIS — Z79899 Other long term (current) drug therapy: Secondary | ICD-10-CM | POA: Diagnosis not present

## 2016-02-13 DIAGNOSIS — R109 Unspecified abdominal pain: Secondary | ICD-10-CM | POA: Diagnosis present

## 2016-02-13 DIAGNOSIS — Z794 Long term (current) use of insulin: Secondary | ICD-10-CM | POA: Diagnosis not present

## 2016-02-13 DIAGNOSIS — Z9889 Other specified postprocedural states: Secondary | ICD-10-CM | POA: Diagnosis not present

## 2016-02-13 DIAGNOSIS — F17210 Nicotine dependence, cigarettes, uncomplicated: Secondary | ICD-10-CM | POA: Insufficient documentation

## 2016-02-13 DIAGNOSIS — Z880 Allergy status to penicillin: Secondary | ICD-10-CM | POA: Diagnosis not present

## 2016-02-13 DIAGNOSIS — G8918 Other acute postprocedural pain: Secondary | ICD-10-CM | POA: Diagnosis not present

## 2016-02-13 LAB — COMPREHENSIVE METABOLIC PANEL
ALT: 35 U/L (ref 17–63)
AST: 38 U/L (ref 15–41)
Albumin: 3.4 g/dL — ABNORMAL LOW (ref 3.5–5.0)
Alkaline Phosphatase: 116 U/L (ref 38–126)
Anion gap: 9 (ref 5–15)
BILIRUBIN TOTAL: 0.3 mg/dL (ref 0.3–1.2)
BUN: 19 mg/dL (ref 6–20)
CALCIUM: 10.5 mg/dL — AB (ref 8.9–10.3)
CO2: 28 mmol/L (ref 22–32)
CREATININE: 0.69 mg/dL (ref 0.61–1.24)
Chloride: 101 mmol/L (ref 101–111)
GLUCOSE: 102 mg/dL — AB (ref 65–99)
Potassium: 4.1 mmol/L (ref 3.5–5.1)
Sodium: 138 mmol/L (ref 135–145)
Total Protein: 8.7 g/dL — ABNORMAL HIGH (ref 6.5–8.1)

## 2016-02-13 LAB — CBC
HCT: 38.7 % — ABNORMAL LOW (ref 39.0–52.0)
Hemoglobin: 12 g/dL — ABNORMAL LOW (ref 13.0–17.0)
MCH: 27.3 pg (ref 26.0–34.0)
MCHC: 31 g/dL (ref 30.0–36.0)
MCV: 88 fL (ref 78.0–100.0)
PLATELETS: 156 10*3/uL (ref 150–400)
RBC: 4.4 MIL/uL (ref 4.22–5.81)
RDW: 17.1 % — ABNORMAL HIGH (ref 11.5–15.5)
WBC: 5.3 10*3/uL (ref 4.0–10.5)

## 2016-02-13 LAB — I-STAT CG4 LACTIC ACID, ED: LACTIC ACID, VENOUS: 1.03 mmol/L (ref 0.5–2.0)

## 2016-02-13 LAB — LIPASE, BLOOD: LIPASE: 26 U/L (ref 11–51)

## 2016-02-13 NOTE — ED Notes (Signed)
Pt reports that 2 months ago he had abd surgery due to a perforated bowel. Pt now has two drains in the peritoneal cavity which he is worried may be infected. Pt alert x4. NAD at this time.

## 2016-02-14 ENCOUNTER — Emergency Department (HOSPITAL_COMMUNITY)
Admission: EM | Admit: 2016-02-14 | Discharge: 2016-02-14 | Disposition: A | Discharge status: Home / Self Care | Payer: Medicaid Other | Attending: Emergency Medicine | Admitting: Emergency Medicine

## 2016-02-14 DIAGNOSIS — L7682 Other postprocedural complications of skin and subcutaneous tissue: Secondary | ICD-10-CM

## 2016-02-14 LAB — I-STAT CG4 LACTIC ACID, ED: Lactic Acid, Venous: 0.88 mmol/L (ref 0.5–2.0)

## 2016-02-14 NOTE — ED Provider Notes (Signed)
By signing my name below, I, [name], attest that this documentation has been prepared under the direction and in the presence of [place], DO . Electronically Signed: [name], Scribe. 02/14/2016. 12:33 AM.   TIME SEEN: 12:22 AM  CHIEF COMPLAINT:  Chief Complaint  Patient presents with  . Abdominal Pain    HPI:  HPI Comments:  [name] [name] is a 65 y.o. male with history of chronic pain, recent small bowel obstruction in December 2016 who failed medical management and was taken to the operating room. 2017 and found to have a small bowel perforation and peritonitis, subsequently had multiple abdominal abscesses and had 2 percutaneous drains placed on 12/08/2015 who presents to the Emergency Department complaining of moderate pain to site of abdominal drains. Pt states the pain began ~ 1 week ago when the "drains became unstable" - states that the sutures are pulling in his scanning causing him pain. The dressing to the sites were last changed  today by home health nurse. Pt is not currently on antibiotic. He denies fever, vomiting, diarrhea, and appetite change.  No alleviating factors noted. States that pain is just around the skin where the drain inserts into the abdomen. There is no deeper abdominal pain. He states that his drainage is slowly improving. He has a home health nurse that flushes these drains regularly. He denies that the drains appeared to have moved at all. Pt is unsure when his next appointment with IR will be but states he should be following up with them soon. States that his home health nurse today became concerned and instructed him to come to the emergency department.    ROS: See HPI Constitutional: no fever  Eyes: no drainage  ENT: no runny nose   Cardiovascular:  no chest pain  Resp: no SOB  GI: no vomiting GU: no dysuria Integumentary: no rash  Allergy: no hives  Musculoskeletal: no leg swelling  Neurological: no slurred speech ROS otherwise  negative  PAST MEDICAL HISTORY/PAST SURGICAL HISTORY:  History reviewed. No pertinent past medical history.  MEDICATIONS:  Prior to Admission medications   Medication Sig Start Date End Date Taking? Authorizing Provider  acetaminophen (TYLENOL) 325 MG tablet Take 2 tablets (650 mg total) by mouth every 6 (six) hours as needed for fever. 12/29/15   Robbie Lis, MD  albuterol (PROVENTIL HFA;VENTOLIN HFA) 108 (90 BASE) MCG/ACT inhaler Inhale 2 puffs into the lungs every 6 (six) hours as needed for wheezing or shortness of breath. 11/11/13   Ripudeep Krystal Eaton, MD  alum & mag hydroxide-simeth (MAALOX/MYLANTA) 200-200-20 MG/5ML suspension Take 30 mLs by mouth every 6 (six) hours as needed for indigestion or heartburn (or bloating). 12/29/15   Robbie Lis, MD  antiseptic oral rinse (CPC / CETYLPYRIDINIUM CHLORIDE 0.05%) 0.05 % LIQD solution 7 mLs by Mouth Rinse route 2 times daily at 12 noon and 4 pm. 12/29/15   Robbie Lis, MD  bisacodyl (DULCOLAX) 10 MG suppository Place 1 suppository (10 mg total) rectally every 12 (twelve) hours as needed for mild constipation or moderate constipation. 12/29/15   Robbie Lis, MD  collagenase (SANTYL) ointment Apply thin layer to wound bed of left foot daily Patient taking differently: Apply 1 application topically daily. Apply thin layer to wound bed of left foot daily 10/26/15   Rosetta Posner, MD  diphenhydrAMINE (BENADRYL) 25 mg capsule Take 1 capsule (25 mg total) by mouth every 6 (six) hours as needed for itching, allergies or  sleep (anxiety). 12/29/15   Robbie Lis, MD  feeding supplement, ENSURE ENLIVE, (ENSURE ENLIVE) LIQD Take 237 mLs by mouth 3 (three) times daily between meals. 12/29/15   Robbie Lis, MD  gabapentin (NEURONTIN) 300 MG capsule Take 300 mg by mouth 3 (three) times daily.    Historical Provider, MD  insulin aspart (NOVOLOG) 100 UNIT/ML injection Inject 0-15 Units into the skin 3 (three) times daily with meals. Patient taking differently: Inject 5  Units into the skin 3 (three) times daily with meals.  12/29/15   Robbie Lis, MD  magnesium oxide (MAG-OX) 400 (241.3 Mg) MG tablet Take 1 tablet (400 mg total) by mouth 2 (two) times daily. 12/29/15   Robbie Lis, MD  methocarbamol (ROBAXIN) 500 MG tablet Take 2 tablets (1,000 mg total) by mouth 3 (three) times daily. 12/29/15   Robbie Lis, MD  mirtazapine (REMERON) 15 MG tablet Take 15 mg by mouth at bedtime.    Historical Provider, MD  oxyCODONE 20 MG TABS Take 1 tablet (20 mg total) by mouth every 4 (four) hours as needed for moderate pain. 12/29/15   Robbie Lis, MD  Oxycodone HCl 10 MG TABS Take two tablets by mouth every 4 hours as needed for pain 01/08/16   Tiffany L Reed, DO  Sennosides (SENNA-LAX PO) Take 2 tablets by mouth daily.    Historical Provider, MD  thiamine 100 MG tablet Take 1 tablet (100 mg total) by mouth daily. 12/29/15   Robbie Lis, MD  triamcinolone cream (KENALOG) 0.1 % Apply 1 application topically 2 (two) times daily as needed (for eczema).    Historical Provider, MD  vitamin C (ASCORBIC ACID) 500 MG tablet Take 500 mg by mouth 2 (two) times daily.    Historical Provider, MD  zinc sulfate 220 MG capsule Take 220 mg by mouth daily. X 45 days until 02/23/16    Historical Provider, MD    ALLERGIES:  Allergies  Allergen Reactions  . Zetia [Ezetimibe] Anaphylaxis and Swelling    Tongue and throat   . Atorvastatin Other (See Comments)    Malaise & muscle weakness  . Penicillins Other (See Comments)    Unknown.  Tolerates cefepime  . Pravastatin Sodium     Pravastatin 40 mg qday and 40 mg q M/W/F caused muscle aches  . Lisinopril Rash  . Promethazine Rash    SOCIAL HISTORY:  Social History  Substance Use Topics  . Smoking status: Current Every Day Smoker -- 0.50 packs/day for 46 years    Types: Cigarettes  . Smokeless tobacco: Never Used  . Alcohol Use: 0.0 oz/week    0 Standard drinks or equivalent per week     Comment: [id number]/[id number] "last drink was 3-4 yr  ago; never had problem w/it"    FAMILY HISTORY: Family History  Problem Relation Age of Onset  . Hypertension Mother   . Heart attack Mother   . Diabetes Mother   . Aneurysm Father   . Heart attack Sister   . Heart disease Brother     heart transplant    EXAM: BP 133/90 mmHg  Pulse 85  Temp(Src) 99 F (37.2 C) (Oral)  Resp 18  SpO2 99% CONSTITUTIONAL: Alert and oriented and responds appropriately to questions. Chronically ill-appearing, afebrile, nontoxic, no distress well-nourished HEAD: Normocephalic EYES: Conjunctivae clear, PERRL ENT: normal nose; no rhinorrhea; moist mucous membranes NECK: Supple, no meningismus, no LAD  CARD: RRR; S1 and S2 appreciated; no murmurs, no clicks,  no rubs, no gallops RESP: Normal chest excursion without splinting or tachypnea; breath sounds clear and equal bilaterally; no wheezes, no rhonchi, no rales, no hypoxia or respiratory distress, speaking full sentences ABD/GI: Normal bowel sounds; non-distended; soft, non-tender, no rebound, no guarding, no peritoneal signs; patient has percutaneous drains in the right upper and left upper quadrants with some tenderness to light palpation of the skin around the strain sites with no erythema, warmth. No induration or fluctuance. Small amount of dried purulent drainage noted on the dressing but no active drainage. He has a small amount of purulent drainage noted in both bulbs.  Patient sutures are intact but do seem to be pulling the skin which causes him discomfort. BACK:  The back appears normal and is non-tender to palpation, there is no CVA tenderness EXT: Normal ROM in all joints; non-tender to palpation; no edema; normal capillary refill; no cyanosis, no calf tenderness or swelling    SKIN: Normal color for age and race; warm; no rash NEURO: Moves all extremities equally, sensation to light touch intact diffusely, cranial nerves II through XII intact PSYCH: The patient's mood and manner are  appropriate. Grooming and personal hygiene are appropriate.  MEDICAL DECISION MAKING: Patient here with pain around the sites of his percutaneous drain. It appears that the sutures are pulling the scan which is causing him discomfort. There is no sign of surrounding cellulitis. He states that the drains have not moved. He is still having drainage but reports this is improving. His last CT scan was 01/24/2016 that showed that his abdominal abscesses are getting smaller. He denies any fevers, vomiting, diarrhea, changes in his appetite. Otherwise his abdominal exam is completely benign. Labs unremarkable with no leukocytosis. Normal lactate. I do not feel this time he needs emergent CT imaging. States that he has follow-up with interventional radiology soon. Have advised him to call in the morning to schedule an appointment. Discussed with him at length return precautions including fever, vomiting, worsening pain, changes in the amount of drainage from his percutaneous drains or accidental removal of one of his drains. Patient verbalizes understanding and is comfortable with this plan.  I personally performed the services described in this documentation, which was scribed in my presence. The recorded information has been reviewed and is accurate.   [place], DO 02/14/16 [phone]

## 2016-02-14 NOTE — Discharge Instructions (Signed)
You were seen in the emergency department for pain around your percutaneous drains. There is no sign of infection around the sites. We recommend that you have your dressing changed as scheduled by her home health nurse. Please call your surgeon and interventional radiologist for outpatient follow-up. If you develop fever of 100.4 higher, vomiting, changes in your appetite, worsening abdominal pain, changes in the amount of drainage from your percutaneous drains or accidental removal of one of your drains, please return to the hospital.

## 2016-02-15 ENCOUNTER — Other Ambulatory Visit (HOSPITAL_COMMUNITY): Payer: Self-pay | Admitting: Interventional Radiology

## 2016-02-15 ENCOUNTER — Other Ambulatory Visit: Payer: Self-pay | Admitting: Physician Assistant

## 2016-02-15 ENCOUNTER — Other Ambulatory Visit: Payer: Self-pay | Admitting: General Surgery

## 2016-02-15 ENCOUNTER — Ambulatory Visit
Admission: RE | Admit: 2016-02-15 | Discharge: 2016-02-15 | Disposition: A | Discharge status: Home / Self Care | Payer: Medicaid Other | Source: Ambulatory Visit | Attending: General Surgery | Admitting: General Surgery

## 2016-02-15 ENCOUNTER — Ambulatory Visit
Admission: RE | Admit: 2016-02-15 | Discharge: 2016-02-15 | Disposition: A | Discharge status: Home / Self Care | Payer: Medicaid Other | Source: Ambulatory Visit | Attending: Interventional Radiology | Admitting: Interventional Radiology

## 2016-02-15 ENCOUNTER — Ambulatory Visit
Admission: RE | Admit: 2016-02-15 | Discharge: 2016-02-15 | Disposition: A | Discharge status: Home / Self Care | Payer: Medicaid Other | Source: Ambulatory Visit | Attending: Physician Assistant | Admitting: Physician Assistant

## 2016-02-15 DIAGNOSIS — K750 Abscess of liver: Secondary | ICD-10-CM

## 2016-02-15 MED ORDER — IOPAMIDOL (ISOVUE-300) INJECTION 61%
100.0000 mL | Freq: Once | INTRAVENOUS | Status: AC | PRN
  Administered 2016-02-15: 100 mL via INTRAVENOUS

## 2016-02-15 NOTE — Progress Notes (Signed)
Chief Complaint: Patient was seen in follow-up today for  Chief Complaint  Patient presents with  . Follow-up    drain    at the request of Ardis Rowan  Referring Physician(s): Ardis Rowan  History of Present Illness: [name] [name] is a 65 y.o. male with bilateral subdiaphragmatic drainage catheters which is been in place since January 2017. Drain output has been scant. Patient is doing well without pain, fever, nausea, vomiting or chills. No active complaints at this time.  No past medical history on file.  Past Surgical History  Procedure Laterality Date  . Iliac artery stent  05/06/2011  . Tonsillectomy    . Aorta - bilateral femoral artery bypass graft  11/25/2011    Procedure: AORTA BIFEMORAL BYPASS GRAFT;  Surgeon: Rosetta Posner, MD;  Location: [place];  Service: Vascular;  Laterality: N/A;  . Pr vein bypass graft,aorto-fem-pop  11/24/2012  . Inguinal hernia repair Right   . Abdominal aortagram N/A 11/11/2011    Procedure: ABDOMINAL Maxcine Ham;  Surgeon: Angelia Mould, MD;  Location[facility] CATH LAB;  Service: Cardiovascular;  Laterality: N/A;  . Lower extremity angiogram Bilateral 11/11/2011    Procedure: LOWER EXTREMITY ANGIOGRAM;  Surgeon: Angelia Mould, MD;  Location: [facility] CATH LAB;  Service: Cardiovascular;  Laterality: Bilateral;  . Left heart catheterization with coronary angiogram N/A 06/07/2013    Procedure: LEFT HEART CATHETERIZATION WITH CORONARY ANGIOGRAM;  Surgeon: Sherren Mocha, MD;  Location: [facility] CATH LAB;  Service: Cardiovascular;  Laterality: N/A;  . Percutaneous coronary stent intervention (pci-s) Right 06/07/2013    Procedure: PERCUTANEOUS CORONARY STENT INTERVENTION (PCI-S);  Surgeon: Sherren Mocha, MD;  Location: [facility] CATH LAB;  Service: Cardiovascular;  Laterality: Right;  . Left heart catheterization with coronary angiogram N/A 12/10/2013    Procedure: LEFT HEART CATHETERIZATION WITH CORONARY ANGIOGRAM;  Surgeon: Blane Ohara, MD;  Location: [facility] CATH LAB;  Service: Cardiovascular;  Laterality: N/A;  . Colonoscopy    . Peripheral vascular catheterization N/A 07/26/2015    Procedure: Abdominal Aortogram;  Surgeon: Serafina Mitchell, MD;  Location: [place] CV LAB;  Service: Cardiovascular;  Laterality: N/A;  . Peripheral vascular catheterization Bilateral 07/26/2015    Procedure: Lower Extremity Angiography;  Surgeon: Serafina Mitchell, MD;  Location: [place] CV LAB;  Service: Cardiovascular;  Laterality: Bilateral;  . Femoral-popliteal bypass graft Left 07/28/2015    Procedure: LEFT FEMORAL-POPLITEAL ARTERY BYPASS GRAFT;  Surgeon: Rosetta Posner, MD;  Location: [place];  Service: Vascular;  Laterality: Left;  . Laparotomy N/A 12/01/2015    Procedure: EXPLORATORY LAPAROTOMY WITH RADICAL PERITONEAL DEBRIDEMENT, CLOSURE OF SMALL BOWEL PERFORATION AND LYSIS OF ADHESIONS.;  Surgeon: Johnathan Hausen, MD;  Location: [facility];  Service: General;  Laterality: N/A;    Allergies: Zetia; Atorvastatin; Penicillins; Pravastatin sodium; Lisinopril; and Promethazine  Medications: Prior to Admission medications   Medication Sig Start Date End Date Taking? Authorizing Provider  acetaminophen (TYLENOL) 325 MG tablet Take 2 tablets (650 mg total) by mouth every 6 (six) hours as needed for fever. 12/29/15   Robbie Lis, MD  albuterol (PROVENTIL HFA;VENTOLIN HFA) 108 (90 BASE) MCG/ACT inhaler Inhale 2 puffs into the lungs every 6 (six) hours as needed for wheezing or shortness of breath. 11/11/13   Ripudeep Krystal Eaton, MD  alum & mag hydroxide-simeth (MAALOX/MYLANTA) 200-200-20 MG/5ML suspension Take 30 mLs by mouth every 6 (six) hours as needed for indigestion or heartburn (or bloating). 12/29/15   Robbie Lis, MD  antiseptic oral  rinse (CPC / CETYLPYRIDINIUM CHLORIDE 0.05%) 0.05 % LIQD solution 7 mLs by Mouth Rinse route 2 times daily at 12 noon and 4 pm. 12/29/15   Robbie Lis, MD  bisacodyl (DULCOLAX) 10 MG suppository Place 1 suppository (10  mg total) rectally every 12 (twelve) hours as needed for mild constipation or moderate constipation. 12/29/15   Robbie Lis, MD  collagenase (SANTYL) ointment Apply thin layer to wound bed of left foot daily Patient taking differently: Apply 1 application topically daily. Apply thin layer to wound bed of left foot daily 10/26/15   Rosetta Posner, MD  diphenhydrAMINE (BENADRYL) 25 mg capsule Take 1 capsule (25 mg total) by mouth every 6 (six) hours as needed for itching, allergies or sleep (anxiety). 12/29/15   Robbie Lis, MD  feeding supplement, ENSURE ENLIVE, (ENSURE ENLIVE) LIQD Take 237 mLs by mouth 3 (three) times daily between meals. 12/29/15   Robbie Lis, MD  gabapentin (NEURONTIN) 300 MG capsule Take 300 mg by mouth 3 (three) times daily.    Historical Provider, MD  insulin aspart (NOVOLOG) 100 UNIT/ML injection Inject 0-15 Units into the skin 3 (three) times daily with meals. Patient taking differently: Inject 5 Units into the skin 3 (three) times daily with meals.  12/29/15   Robbie Lis, MD  magnesium oxide (MAG-OX) 400 (241.3 Mg) MG tablet Take 1 tablet (400 mg total) by mouth 2 (two) times daily. 12/29/15   Robbie Lis, MD  methocarbamol (ROBAXIN) 500 MG tablet Take 2 tablets (1,000 mg total) by mouth 3 (three) times daily. 12/29/15   Robbie Lis, MD  mirtazapine (REMERON) 15 MG tablet Take 15 mg by mouth at bedtime.    Historical Provider, MD  oxyCODONE 20 MG TABS Take 1 tablet (20 mg total) by mouth every 4 (four) hours as needed for moderate pain. 12/29/15   Robbie Lis, MD  Oxycodone HCl 10 MG TABS Take two tablets by mouth every 4 hours as needed for pain 01/08/16   Tiffany L Reed, DO  Sennosides (SENNA-LAX PO) Take 2 tablets by mouth daily.    Historical Provider, MD  thiamine 100 MG tablet Take 1 tablet (100 mg total) by mouth daily. 12/29/15   Robbie Lis, MD  triamcinolone cream (KENALOG) 0.1 % Apply 1 application topically 2 (two) times daily as needed (for eczema).    Historical  Provider, MD  vitamin C (ASCORBIC ACID) 500 MG tablet Take 500 mg by mouth 2 (two) times daily.    Historical Provider, MD  zinc sulfate 220 MG capsule Take 220 mg by mouth daily. X 45 days until 02/23/16    Historical Provider, MD     Family History  Problem Relation Age of Onset  . Hypertension Mother   . Heart attack Mother   . Diabetes Mother   . Aneurysm Father   . Heart attack Sister   . Heart disease Brother     heart transplant    Social History   Social History  . Marital Status: Single    Spouse Name: N/A  . Number of Children: N/A  . Years of Education: N/A   Social History Main Topics  . Smoking status: Current Every Day Smoker -- 0.50 packs/day for 46 years    Types: Cigarettes  . Smokeless tobacco: Never Used  . Alcohol Use: 0.0 oz/week    0 Standard drinks or equivalent per week     Comment: a12/17/20214 "last drink was 3-4 yr  ago; never had problem w/it"  . Drug Use: 1.00 per week    Special: Marijuana, Cocaine     Comment: stopped May 07, 2012  . Sexual Activity: Not Currently   Other Topics Concern  . Not on file   Social History Narrative    Review of Systems: A 12 point ROS discussed and pertinent positives are indicated in the HPI above.  All other systems are negative.  Review of Systems  Vital Signs: BP 133/73 mmHg  Pulse 91  Temp(Src) 97.9 F (36.6 C) (Oral)  Resp 14  SpO2 99%  Physical Exam  Constitutional: He appears well-developed and well-nourished. No distress.  HENT:  Head: Normocephalic and atraumatic.  Eyes: No scleral icterus.  Cardiovascular: Normal rate and regular rhythm.   Pulmonary/Chest: Effort normal.  Abdominal: Soft. He exhibits no distension. There is no tenderness.    Small indurated but non-draining and nonfluctuant 1 cm nodule inferior to the right upper quadrant drain insertion site. This is at the site of the prior retention suture and likely represents a suture granuloma.  Nursing note and vitals  reviewed.   Imaging: Ct Abdomen W Contrast  02/15/2016  CLINICAL DATA:  65 year old male with a history of bilateral subdiaphragmatic abscesses status post percutaneous drain placement on 12/08/2015. Drain output has been scant. Patient presents for repeat evaluation. EXAM: CT ABDOMEN WITH CONTRAST TECHNIQUE: Multidetector CT imaging of the abdomen was performed using the standard protocol following bolus administration of intravenous contrast. CONTRAST:  175mL ISOVUE-300 IOPAMIDOL (ISOVUE-300) INJECTION 61% COMPARISON:  Most recent prior CT scan of the abdomen and pelvis 01/24/2016 FINDINGS: Bilateral subdiaphragmatic chains remain in unchanged position. No significant residual fluid collection about either drain. There is residual peritoneal thickening/ phlegmon. As previously noted, the the left upper quadrant drainage catheter comes in very close proximity to the pleural surface. It is unclear if this transgress is the pleura or abuts it. Interval resolution of pleural effusions. There is some mild residual atelectasis in the right lower lobe. Otherwise, the lung bases are relatively clear. Visualized cardiac structures are within normal limits for size. No pericardial effusion. Unremarkable CT appearance of the stomach, duodenum, spleen, adrenal glands and pancreas. Normal hepatic contour and morphology. No discrete hepatic lesion. Gallbladder is unremarkable. No intra or extrahepatic biliary ductal dilatation. Unchanged nearly 6 cm right renal cyst. Additional low-attenuation cysts noted throughout the left kidney. No hydronephrosis, enhancing mass or nephrolithiasis. Native aortoiliac occlusion. Surgical changes of prior aorto bi femoral bypass graft. The graft is incompletely imaged on this abdominal CT. The imaged portion appears patent. No evidence of bowel obstruction or focal bowel wall thickening. No acute fracture or aggressive appearing lytic or blastic osseous lesion. IMPRESSION: 1. Interval  resolution of bilateral subdiaphragmatic fluid collections. Drainage catheters remain well positioned. 2. Mild trace residual atelectasis in the right lower lobe. 3. Additional ancillary findings as above. Signed, Criselda Peaches, MD [facility] Electronically Signed   By: Jacqulynn Cadet M.D.   On: 02/15/2016 14:10   Ct Abdomen Pelvis W Contrast  01/24/2016  CLINICAL DATA:  65 year old with abdominal abscesses. Follow-up abdominal abscesses and percutaneous drains. EXAM: CT ABDOMEN AND PELVIS WITH CONTRAST TECHNIQUE: Multidetector CT imaging of the abdomen and pelvis was performed using the standard protocol following bolus administration of intravenous contrast. CONTRAST:  127mL ISOVUE-300 IOPAMIDOL (ISOVUE-300) INJECTION 61% COMPARISON:  CT 01/09/2016 FINDINGS: Lower chest: The he right pleural effusion has essentially resolved. There continues to be a small amount  of left pleural fluid. Evidence for scarring at the right lung base. The left upper abdominal drainage catheter does traverse the left pleural surface but there is no large pneumothorax. Cardiac stent in the right coronary artery. Hepatobiliary: No acute abnormality in the liver. Again noted is a small hypervascular or enhancing structure along the posterior right hepatic dome roughly measuring 1 cm. There is also a punctate low-density structure between the left or right hepatic lobes which is unchanged and could represent a small cyst. Gallbladder is decompressed. No significant biliary dilatation. Again noted is a small amount of fluid between the inferior liver and right kidney. Pancreas: Mild dilatation of main pancreatic duct is unchanged. No evidence for acute inflammation. Spleen: Normal appearance of the spleen without enlargement. Adrenals/Urinary Tract: No acute abnormality to the adrenal glands. Again noted are bilateral renal cysts. The largest is on the right side and  measures up to 5.7 cm. No evidence for hydronephrosis. Stomach/Bowel: Large amount of stool in the cecum which is located on the right side of the pelvis. Mild mesenteric edema. There is no significant small bowel dilatation. Normal appearance of the stomach. Vascular/Lymphatic: Patient has an aortobifemoral bypass graft. Bilateral femoral limbs are patent. There is close to 50% narrowing of the right common femoral artery which is similar to the previous examination. 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 There are stents in the native distal abdominal aorta and left iliac arteries. There is minor flow in the native left iliac arteries. No significant abdominal or pelvic lymphadenopathy. Reproductive: No gross abnormality to the prostate but this area is poorly characterized. Other: The percutaneous drainage catheter in the right upper abdomen is unchanged in position. The catheter continues to be positioned within a perihepatic abscess. The perihepatic abscess has decreased in size measuring 4.5 x 1.6 cm on sequence 3, image 18 and previously measured 6.2 x 1.9 cm at a similar level. There is a small amount of gas within this collection which is unchanged. There is a small enhancing fluid collection just inferior to the liver which now measures 1.6 x 0.9 cm on sequence 3, image 42 and previously measured 2.2 x 1.1 cm. The percutaneous drainage catheter in the left upper abdomen is also in a stable position. The abscess collection in this area has nearly resolved and this collection is better appreciated on the coronal reformats. This small peri-splenic collection roughly measures 3.6 x 0.7 cm on the coronal images and previously measured 5.9 x 1.6 cm. There is additional fluid or edema near the left hemidiaphragm which could be related to chronic pleural fluid. In addition, the percutaneous catheter appears to be traversing the left lung at the left lung base but there is  no pneumothorax in this area. Small rim enhancing collection along the left abdomen is also compatible another small abscess measuring 1.5 x 1.3 cm and previously measured 2.4 x 1.2 cm. No new abscess collections. Question trace fluid in the pelvis. Musculoskeletal: Grade 1 retrolisthesis and severe disc space loss at L5-S1. IMPRESSION: Stable position of the bilateral upper abdominal drainage catheters. The associated abscess collections have decreased in size bilaterally. The left upper quadrant collection is almost completely resolved. Two small abscess collections in the abdomen without drains are decreasing in size and nearly resolved. No new abscess collections. Decreasing amount of pleural fluid. The left upper quadrant drain does extend through the left pleura as described. No evidence for a large pneumothorax. Electronically Signed   By: Quita Skye  Anselm Pancoast M.D.   On: 01/24/2016 11:10    Labs:  CBC:  Recent Labs  12/26/15 0415 12/29/15 0502 12/30/15 0614 02/13/16 1610  WBC 12.5* 12.0* 10.8* 5.3  HGB 8.0* 7.6* 7.7* 12.0*  HCT 25.8* 24.7* 24.3* 38.7*  PLT 226 315 332 156    COAGS:  Recent Labs  07/26/15 1415 12/06/15 0925 12/08/15 0340 12/27/15 0415  INR 1.09 1.89* 1.82* 1.28  APTT 37 45*  --   --     BMP:  Recent Labs  12/28/15 0620 12/29/15 0502 12/30/15 0614 02/13/16 1610  NA 131* 130* 131* 138  K 4.6 4.8 4.7 4.1  CL 92* 92* 94* 101  CO2 32 33* 32 28  GLUCOSE 169* 163* 179* 102*  BUN 18 17 18 19   CALCIUM 9.1 8.6* 8.7* 10.5*  CREATININE 0.65 0.63 0.64 0.69  GFRNONAA >60 >60 >60 >60  GFRAA >60 >60 >60 >60    LIVER FUNCTION TESTS:  Recent Labs  12/21/15 0435 12/25/15 0450 12/28/15 0620 02/13/16 1610  BILITOT 0.7 0.3 0.4 0.3  AST 23 21 26  38  ALT 12* 12* 17 35  ALKPHOS 112 118 144* 116  PROT 6.2* 7.1 7.3 8.7*  ALBUMIN 1.8* 1.9* 2.0* 3.4*    TUMOR MARKERS: No results for input(s): AFPTM, CEA, CA199, CHROMGRNA in the last 8760 hours.  Assessment and  Plan:  Drain output is scant and CT imaging today demonstrates no residual abscess in either subdiaphragmatic space. The percutaneous drains were removed.   Electronically Signed: Jacqulynn Cadet 02/15/2016, 3:14 PM   I spent a total of 15 Minutes in face to face in clinical consultation, greater than 50% of which was counseling/coordinating care for postoperative drains.

## 2016-02-19 ENCOUNTER — Other Ambulatory Visit: Payer: Medicaid Other

## 2016-03-30 ENCOUNTER — Other Ambulatory Visit: Payer: Self-pay | Admitting: Adult Health

## 2016-05-08 ENCOUNTER — Emergency Department (HOSPITAL_COMMUNITY)
Admission: EM | Admit: 2016-05-08 | Discharge: 2016-05-08 | Disposition: A | Discharge status: Home / Self Care | Payer: Medicaid Other | Attending: Emergency Medicine | Admitting: Emergency Medicine

## 2016-05-08 ENCOUNTER — Ambulatory Visit (INDEPENDENT_AMBULATORY_CARE_PROVIDER_SITE_OTHER): Payer: Medicaid Other | Admitting: Family

## 2016-05-08 ENCOUNTER — Encounter (HOSPITAL_COMMUNITY): Payer: Self-pay | Admitting: Emergency Medicine

## 2016-05-08 ENCOUNTER — Encounter: Payer: Self-pay | Admitting: Family

## 2016-05-08 ENCOUNTER — Ambulatory Visit (HOSPITAL_COMMUNITY)
Admission: RE | Admit: 2016-05-08 | Discharge: 2016-05-08 | Disposition: A | Discharge status: Home / Self Care | Payer: Medicaid Other | Source: Ambulatory Visit | Attending: Family | Admitting: Family

## 2016-05-08 VITALS — BP 119/82 | HR 77 | Temp 97.4°F | Resp 18 | Ht 75.0 in | Wt 131.0 lb

## 2016-05-08 DIAGNOSIS — E1151 Type 2 diabetes mellitus with diabetic peripheral angiopathy without gangrene: Secondary | ICD-10-CM

## 2016-05-08 DIAGNOSIS — F1120 Opioid dependence, uncomplicated: Secondary | ICD-10-CM | POA: Diagnosis not present

## 2016-05-08 DIAGNOSIS — R0989 Other specified symptoms and signs involving the circulatory and respiratory systems: Secondary | ICD-10-CM | POA: Diagnosis present

## 2016-05-08 DIAGNOSIS — I70229 Atherosclerosis of native arteries of extremities with rest pain, unspecified extremity: Secondary | ICD-10-CM

## 2016-05-08 DIAGNOSIS — Z95828 Presence of other vascular implants and grafts: Secondary | ICD-10-CM | POA: Diagnosis not present

## 2016-05-08 DIAGNOSIS — L97909 Non-pressure chronic ulcer of unspecified part of unspecified lower leg with unspecified severity: Secondary | ICD-10-CM

## 2016-05-08 DIAGNOSIS — F17200 Nicotine dependence, unspecified, uncomplicated: Secondary | ICD-10-CM

## 2016-05-08 DIAGNOSIS — I998 Other disorder of circulatory system: Secondary | ICD-10-CM | POA: Insufficient documentation

## 2016-05-08 DIAGNOSIS — Z720 Tobacco use: Secondary | ICD-10-CM

## 2016-05-08 DIAGNOSIS — T82898A Other specified complication of vascular prosthetic devices, implants and grafts, initial encounter: Secondary | ICD-10-CM

## 2016-05-08 DIAGNOSIS — F17210 Nicotine dependence, cigarettes, uncomplicated: Secondary | ICD-10-CM | POA: Diagnosis not present

## 2016-05-08 DIAGNOSIS — Z794 Long term (current) use of insulin: Secondary | ICD-10-CM | POA: Insufficient documentation

## 2016-05-08 DIAGNOSIS — G8929 Other chronic pain: Secondary | ICD-10-CM | POA: Insufficient documentation

## 2016-05-08 DIAGNOSIS — R109 Unspecified abdominal pain: Secondary | ICD-10-CM | POA: Diagnosis present

## 2016-05-08 DIAGNOSIS — I70299 Other atherosclerosis of native arteries of extremities, unspecified extremity: Secondary | ICD-10-CM

## 2016-05-08 MED ORDER — TRAMADOL HCL 50 MG PO TABS: ORAL_TABLET | ORAL | Status: DC

## 2016-05-08 MED ORDER — DICYCLOMINE HCL 20 MG PO TABS: 20.0000 mg | ORAL_TABLET | Freq: Two times a day (BID) | ORAL | Status: DC

## 2016-05-08 MED ORDER — ONDANSETRON HCL 4 MG PO TABS: 4.0000 mg | ORAL_TABLET | Freq: Four times a day (QID) | ORAL | Status: DC

## 2016-05-08 MED ORDER — CEPHALEXIN 500 MG PO CAPS: 500.0000 mg | ORAL_CAPSULE | Freq: Three times a day (TID) | ORAL | Status: DC

## 2016-05-08 NOTE — Discharge Instructions (Signed)
Chronic Pain  Chronic pain can be defined as pain that is off and on and lasts for 3-6 months or longer. Many things cause chronic pain, which can make it difficult to make a diagnosis. There are many treatment options available for chronic pain. However, finding a treatment that works well for you may require trying various approaches until the right one is found. Many people benefit from a combination of two or more types of treatment to control their pain.  SYMPTOMS   Chronic pain can occur anywhere in the body and can range from mild to very severe. Some types of chronic pain include:  · Headache.  · Low back pain.  · Cancer pain.  · Arthritis pain.  · Neurogenic pain. This is pain resulting from damage to nerves.   People with chronic pain may also have other symptoms such as:  · Depression.  · Anger.  · Insomnia.  · Anxiety.  DIAGNOSIS   Your health care provider will help diagnose your condition over time. In many cases, the initial focus will be on excluding possible conditions that could be causing the pain. Depending on your symptoms, your health care provider may order tests to diagnose your condition. Some of these tests may include:   · Blood tests.    · CT scan.    · MRI.    · X-rays.    · Ultrasounds.    · Nerve conduction studies.    You may need to see a specialist.   TREATMENT   Finding treatment that works well may take time. You may be referred to a pain specialist. He or she may prescribe medicine or therapies, such as:   · Mindful meditation or yoga.  · Shots (injections) of numbing or pain-relieving medicines into the spine or area of pain.  · Local electrical stimulation.  · Acupuncture.    · Massage therapy.    · Aroma, color, light, or sound therapy.    · Biofeedback.    · Working with a physical therapist to keep from getting stiff.    · Regular, gentle exercise.    · Cognitive or behavioral therapy.    · Group support.    Sometimes, surgery may be recommended.   HOME CARE INSTRUCTIONS    · Take all medicines as directed by your health care provider.    · Lessen stress in your life by relaxing and doing things such as listening to calming music.    · Exercise or be active as directed by your health care provider.    · Eat a healthy diet and include things such as vegetables, fruits, fish, and lean meats in your diet.    · Keep all follow-up appointments with your health care provider.    · Attend a support group with others suffering from chronic pain.  SEEK MEDICAL CARE IF:   · Your pain gets worse.    · You develop a new pain that was not there before.    · You cannot tolerate medicines given to you by your health care provider.    · You have new symptoms since your last visit with your health care provider.    SEEK IMMEDIATE MEDICAL CARE IF:   · You feel weak.    · You have decreased sensation or numbness.    · You lose control of bowel or bladder function.    · Your pain suddenly gets much worse.    · You develop shaking.  · You develop chills.  · You develop confusion.  · You develop chest pain.  · You develop shortness of breath.    MAKE SURE YOU:  ·   Document Revised: 07/14/2013 Document Reviewed: 05/07/2013 Elsevier Interactive Patient Education 2016 [facility]/Residential  Substance Abuse Treatment Adults The [employer] 211 is a great source of information about community services available.  Access by dialing 2-1-1 from anywhere in [place], or by website -  CustodianSupp[employer].fi.   (Updated 11/2015)  Crisis Assistance 24 hours a day   [place]  24-hour crisis assistance: [place], [place]   [facility]  24-hour crisis [place], [place]   24-hour crisis assistance: [place], [place] Access to Care Line  24-hour crisis assistance; [phone] All   [facility]  24-hour crisis response line: [phone] All   Other Local Resources (Updated 11/2015)  Inpatient Behavioral Health/Residential Substance Abuse Treatment Programs   Services      Address and Phone Number  [place] ([place])   14-day residential rehabilitation  [phone] [address] [place], [place] ([place])    Detox - private pay only  14-day residential rehabilitation -  Medicaid, insurance, private pay only [phone], or [place], [place], [place] [address]   [place] only  Multiple facilities [phone] admissions   [facility] ([place])   90-day program  Must be homeless to participate  [phone], or [place], [place] only [phone], or  [place], [place] [address]  [place]     Must make an appointment  Transportation is offered from [place] on [address] [employer] [address].  Accepts private pay, [facility] [phone]  [address] [place], [place] [address]   [employer]  Females only  [facility] [phone] [place], [place] [address]  [place]   Private insurance only [phone], or [phone] [address] [place], [id number]  [place]    Detox  Residential rehabilitation  Private insurance only  Multiple locations  [phone] admissions  [place] of [place] pay  Private insurance [phone] [address] [place] [address]  [facility]    Males only  Fee required at time of admission [place], [place] [address]  [place] pay only  [phone] [address] [place], [place]  [facility] (Residential Treatment Services)    Detox - private pay, Medicaid  Residential rehabilitation for males  - Medicare, Medicaid, insurance, private pay [phone] [place], [place] interviews Monday - Saturday from 8 am - 4 pm  Individuals with legal charges are not eligible [phone] [address] [place], [place] [address]  [facility]   Must be willing to work  Must attend Alcoholics Anonymous meetings [phone] [address] [place], [place]    Faith-based program  Private pay only [phone] [place], [place]

## 2016-05-08 NOTE — ED Provider Notes (Signed)
CSN: [id number]     Arrival date & time 05/08/16  1115 History  By signing my name below, I, Nicole Kindred, attest that this documentation has been prepared under the direction and in the presence of [employer], PA-C.   Electronically Signed: Nicole Kindred, ED Scribe 05/08/2016 at 6:19 PM.   No chief complaint on file.  The history is provided by the patient. No language interpreter was used.   HPI Comments: [name] [name] is a 65 y.o. male who presents to the Emergency Department for pain medication for chronic stomach and left foot pain. Pt states that he has been on opiates for several years and would like a "bridge" prescription until he can follow up with his regular physician. Pt also complains of generalized body aches, diaphoresis, and nausea which he thinks is due to opiate withdraw. Pt has no other acute complaints at this time. No other associated symptoms noted. No worsening or alleviating factors noted. Pt denies shortness of breath, acute arthralgias, chest pain, abdominal pain, or any other pertinent symptoms.  History reviewed. No pertinent past medical history. Past Surgical History  Procedure Laterality Date  . Iliac artery stent  05/06/2011  . Tonsillectomy    . Aorta - bilateral femoral artery bypass graft  11/25/2011    Procedure: AORTA BIFEMORAL BYPASS GRAFT;  Surgeon: Rosetta Posner, MD;  Location: [place];  Service: Vascular;  Laterality: N/A;  . Pr vein bypass graft,aorto-fem-pop  11/24/2012  . Inguinal hernia repair Right   . Abdominal aortagram N/A 11/11/2011    Procedure: ABDOMINAL Maxcine Ham;  Surgeon: Angelia Mould, MD;  Location: [facility] CATH LAB;  Service: Cardiovascular;  Laterality: N/A;  . Lower extremity angiogram Bilateral 11/11/2011    Procedure: LOWER EXTREMITY ANGIOGRAM;  Surgeon: Angelia Mould, MD;  Location: [facility] CATH LAB;  Service: Cardiovascular;  Laterality: Bilateral;  . Left heart catheterization with coronary angiogram N/A  06/07/2013    Procedure: LEFT HEART CATHETERIZATION WITH CORONARY ANGIOGRAM;  Surgeon: Sherren Mocha, MD;  Location: [facility] CATH LAB;  Service: Cardiovascular;  Laterality: N/A;  . Percutaneous coronary stent intervention (pci-s) Right 06/07/2013    Procedure: PERCUTANEOUS CORONARY STENT INTERVENTION (PCI-S);  Surgeon: Sherren Mocha, MD;  Location: [facility] CATH LAB;  Service: Cardiovascular;  Laterality: Right;  . Left heart catheterization with coronary angiogram N/A 12/10/2013    Procedure: LEFT HEART CATHETERIZATION WITH CORONARY ANGIOGRAM;  Surgeon: Blane Ohara, MD;  Location: [facility] CATH LAB;  Service: Cardiovascular;  Laterality: N/A;  . Colonoscopy    . Peripheral vascular catheterization N/A 07/26/2015    Procedure: Abdominal Aortogram;  Surgeon: Serafina Mitchell, MD;  Location: [place] CV LAB;  Service: Cardiovascular;  Laterality: N/A;  . Peripheral vascular catheterization Bilateral 07/26/2015    Procedure: Lower Extremity Angiography;  Surgeon: Serafina Mitchell, MD;  Location: [place] CV LAB;  Service: Cardiovascular;  Laterality: Bilateral;  . Femoral-popliteal bypass graft Left 07/28/2015    Procedure: LEFT FEMORAL-POPLITEAL ARTERY BYPASS GRAFT;  Surgeon: Rosetta Posner, MD;  Location: [place];  Service: Vascular;  Laterality: Left;  . Laparotomy N/A 12/01/2015    Procedure: EXPLORATORY LAPAROTOMY WITH RADICAL PERITONEAL DEBRIDEMENT, CLOSURE OF SMALL BOWEL PERFORATION AND LYSIS OF ADHESIONS.;  Surgeon: Johnathan Hausen, MD;  Location: [facility];  Service: General;  Laterality: N/A;   Family History  Problem Relation Age of Onset  . Hypertension Mother   . Heart attack Mother   . Diabetes Mother   . Aneurysm Father   . Heart attack  Sister   . Heart disease Brother     heart transplant   Social History  Substance Use Topics  . Smoking status: Current Every Day Smoker -- 0.50 packs/day for 46 years    Types: Cigarettes  . Smokeless tobacco: Never Used  . Alcohol Use: 0.0 oz/week    0  Standard drinks or equivalent per week     Comment: [id number]/[id number] "last drink was 3-4 yr ago; never had problem w/it"    Review of Systems A complete 10 system review of systems was obtained and all systems are negative except as noted in the HPI and PMH.    Allergies  Zetia; Atorvastatin; Penicillins; Pravastatin sodium; Lisinopril; and Promethazine  Home Medications   Prior to Admission medications   Medication Sig Start Date End Date Taking? Authorizing Provider  acetaminophen (TYLENOL) 325 MG tablet Take 2 tablets (650 mg total) by mouth every 6 (six) hours as needed for fever. 12/29/15   Robbie Lis, MD  albuterol (PROVENTIL HFA;VENTOLIN HFA) 108 (90 BASE) MCG/ACT inhaler Inhale 2 puffs into the lungs every 6 (six) hours as needed for wheezing or shortness of breath. 11/11/13   Ripudeep Krystal Eaton, MD  alum & mag hydroxide-simeth (MAALOX/MYLANTA) 200-200-20 MG/5ML suspension Take 30 mLs by mouth every 6 (six) hours as needed for indigestion or heartburn (or bloating). 12/29/15   Robbie Lis, MD  antiseptic oral rinse (CPC / CETYLPYRIDINIUM CHLORIDE 0.05%) 0.05 % LIQD solution 7 mLs by Mouth Rinse route 2 times daily at 12 noon and 4 pm. 12/29/15   Robbie Lis, MD  bisacodyl (DULCOLAX) 10 MG suppository Place 1 suppository (10 mg total) rectally every 12 (twelve) hours as needed for mild constipation or moderate constipation. 12/29/15   Robbie Lis, MD  cephALEXin (KEFLEX) 500 MG capsule Take 1 capsule (500 mg total) by mouth 3 (three) times daily. 05/08/16   Sharmon Leyden Nickel, NP  collagenase (SANTYL) ointment Apply thin layer to wound bed of left foot daily Patient not taking: Reported on 05/08/2016 10/26/15   Rosetta Posner, MD  dicyclomine (BENTYL) 20 MG tablet Take 1 tablet (20 mg total) by mouth 2 (two) times daily. 05/08/16   Comer Locket, PA-C  diphenhydrAMINE (BENADRYL) 25 mg capsule Take 1 capsule (25 mg total) by mouth every 6 (six) hours as needed for itching, allergies or sleep  (anxiety). 12/29/15   Robbie Lis, MD  feeding supplement, ENSURE ENLIVE, (ENSURE ENLIVE) LIQD Take 237 mLs by mouth 3 (three) times daily between meals. 12/29/15   Robbie Lis, MD  gabapentin (NEURONTIN) 300 MG capsule Take 300 mg by mouth 3 (three) times daily.    Historical Provider, MD  insulin aspart (NOVOLOG) 100 UNIT/ML injection Inject 0-15 Units into the skin 3 (three) times daily with meals. Patient not taking: Reported on 05/08/2016 12/29/15   Robbie Lis, MD  magnesium oxide (MAG-OX) 400 (241.3 Mg) MG tablet Take 1 tablet (400 mg total) by mouth 2 (two) times daily. 12/29/15   Robbie Lis, MD  methocarbamol (ROBAXIN) 500 MG tablet Take 2 tablets (1,000 mg total) by mouth 3 (three) times daily. Patient not taking: Reported on 05/08/2016 12/29/15   Robbie Lis, MD  mirtazapine (REMERON) 15 MG tablet Take 15 mg by mouth at bedtime. Reported on 05/08/2016    Historical Provider, MD  ondansetron (ZOFRAN) 4 MG tablet Take 1 tablet (4 mg total) by mouth every 6 (six) hours. Patient not taking: Reported on 05/08/2016 05/08/16  Comer Locket, PA-C  oxyCODONE 20 MG TABS Take 1 tablet (20 mg total) by mouth every 4 (four) hours as needed for moderate pain. 12/29/15   Robbie Lis, MD  Oxycodone HCl 10 MG TABS Take two tablets by mouth every 4 hours as needed for pain Patient not taking: Reported on 05/08/2016 01/08/16   Tiffany L Reed, DO  Sennosides (SENNA-LAX PO) Take 2 tablets by mouth daily.    Historical Provider, MD  thiamine 100 MG tablet Take 1 tablet (100 mg total) by mouth daily. 12/29/15   Robbie Lis, MD  traMADol Veatrice Bourbon) 50 MG tablet Take 1-2 tablets by mouth every 6 hours as needed for pain 05/08/16   Sharmon Leyden Nickel, NP  triamcinolone cream (KENALOG) 0.1 % Apply 1 application topically 2 (two) times daily as needed (for eczema).    Historical Provider, MD  vitamin C (ASCORBIC ACID) 500 MG tablet Take 500 mg by mouth 2 (two) times daily.    Historical Provider, MD  zinc sulfate 220 MG  capsule Take 220 mg by mouth daily. X 45 days until 02/23/16    Historical Provider, MD   BP 143/95 mmHg  Pulse 68  Temp(Src) 97.9 F (36.6 C) (Oral)  Resp 16  Ht 6\' 3"  (1.905 m)  Wt 133 lb (60.328 kg)  BMI 16.62 kg/m2  SpO2 100% Physical Exam  Constitutional: He is oriented to person, place, and time. He appears well-developed and well-nourished. No distress.  HENT:  Head: Normocephalic and atraumatic.  Mouth/Throat: Oropharynx is clear and moist.  Eyes: Conjunctivae and EOM are normal. Pupils are equal, round, and reactive to light. Right eye exhibits no discharge. Left eye exhibits no discharge. No scleral icterus.  Neck: Normal range of motion. Neck supple. No tracheal deviation present.  Cardiovascular: Normal rate, regular rhythm and normal heart sounds.  Exam reveals no gallop.   Pulmonary/Chest: Effort normal and breath sounds normal. No respiratory distress. He has no wheezes. He has no rales.  Abdominal: Soft. He exhibits no distension. There is no tenderness.  Musculoskeletal: Normal range of motion. He exhibits no tenderness.  Neurological: He is alert and oriented to person, place, and time.  Cranial Nerves II-XII grossly intact  Skin: Skin is warm and dry. No rash noted.  Psychiatric: He has a normal mood and affect. His behavior is normal.  Nursing note and vitals reviewed.   ED Course  Procedures (including critical care time) DIAGNOSTIC STUDIES: Oxygen Saturation is 100% on RA, normal by my interpretation.    COORDINATION OF CARE: 1:24 PM Discussed treatment plan with pt at bedside and pt agreed to plan.  Labs Review Labs Reviewed - No data to display  Imaging Review No results found.   EKG Interpretation None      MDM  Patient with history of chronic pain and opioid dependence here for opioid pain medicine refill. Discussed will not be refilling this medication. Will need follow-up with PCP, referral given. Hemodynamically stable and afebrile.  Medications given for opioid withdrawal. No evidence of other acute or emergent pathology. Final diagnoses:  Chronic pain   I personally performed the services described in this documentation, which was scribed in my presence. The recorded information has been reviewed and is accurate.      Comer Locket, PA-C 05/08/16 1820  Leo Grosser, MD 05/08/16 2003

## 2016-05-08 NOTE — Progress Notes (Signed)
[facility] [place]   CC: Follow up peripheral artery occlusive disease  History of Present Illness [name] [name] is a 65 y.o. male patient of Dr. Donnetta Hutching who is s/p left femoral to below-knee popliteal bypass with saphenous vein on 07/28/2015.   Dr. Donnetta Hutching last saw pt in December 2016. At that time pt had excellent flow to his foot. He had an extremely large wound over the dorsum of his foot of great toe. Dr. Donnetta Hutching debrided this eschar at pt's November 2016 visit. He continued to have a nice granulating base on the dorsum of his foot. The wound was still very large but contracting. Dr. Donnetta Hutching advised pt to continue with local wound care. Pt advised to return in 3 weeks for continued follow-up.  Pt has not returned until today. He returns today with c/o reinjuring left big toe last week. He denies any pain or problems with his left foot or toe until he stubbed it last week. He is requesting a prescription for an analgesic.   He had surgery in January 2017 for a small bowel obstruction.   Pt Diabetic: Yes, states his A1C is about 6.1, in good control Pt smoker: smoker  (admits to 1/2 ppd x 46 yrs)  Pt meds include: Statin :No Betablocker: No ASA: No Other anticoagulants/antiplatelets: no  No past medical history on file.  Social History Social History  Substance Use Topics  . Smoking status: Current Every Day Smoker -- 0.50 packs/day for 46 years    Types: Cigarettes  . Smokeless tobacco: Never Used  . Alcohol Use: 0.0 oz/week    0 Standard drinks or equivalent per week     Comment: [id number]/17/20214 "last drink was 3-4 yr ago; never had problem w/it"    Family History Family History  Problem Relation Age of Onset  . Hypertension Mother   . Heart attack Mother   . Diabetes Mother   . Aneurysm Father   . Heart attack Sister   . Heart disease Brother     heart transplant    Past Surgical History  Procedure Laterality Date  . Iliac artery stent   05/06/2011  . Tonsillectomy    . Aorta - bilateral femoral artery bypass graft  11/25/2011    Procedure: AORTA BIFEMORAL BYPASS GRAFT;  Surgeon: Rosetta Posner, MD;  Location: [place];  Service: Vascular;  Laterality: N/A;  . Pr vein bypass graft,aorto-fem-pop  11/24/2012  . Inguinal hernia repair Right   . Abdominal aortagram N/A 11/11/2011    Procedure: ABDOMINAL Maxcine Ham;  Surgeon: Angelia Mould, MD;  Location: [facility] CATH LAB;  Service: Cardiovascular;  Laterality: N/A;  . Lower extremity angiogram Bilateral 11/11/2011    Procedure: LOWER EXTREMITY ANGIOGRAM;  Surgeon: Angelia Mould, MD;  Location: [facility] CATH LAB;  Service: Cardiovascular;  Laterality: Bilateral;  . Left heart catheterization with coronary angiogram N/A 06/07/2013    Procedure: LEFT HEART CATHETERIZATION WITH CORONARY ANGIOGRAM;  Surgeon: Sherren Mocha, MD;  Location: [facility] CATH LAB;  Service: Cardiovascular;  Laterality: N/A;  . Percutaneous coronary stent intervention (pci-s) Right 06/07/2013    Procedure: PERCUTANEOUS CORONARY STENT INTERVENTION (PCI-S);  Surgeon: Sherren Mocha, MD;  Location: [facility] CATH LAB;  Service: Cardiovascular;  Laterality: Right;  . Left heart catheterization with coronary angiogram N/A 12/10/2013    Procedure: LEFT HEART CATHETERIZATION WITH CORONARY ANGIOGRAM;  Surgeon: Blane Ohara, MD;  Location: [facility] CATH LAB;  Service: Cardiovascular;  Laterality: N/A;  . Colonoscopy    . Peripheral  vascular catheterization N/A 07/26/2015    Procedure: Abdominal Aortogram;  Surgeon: Serafina Mitchell, MD;  Location: [place] CV LAB;  Service: Cardiovascular;  Laterality: N/A;  . Peripheral vascular catheterization Bilateral 07/26/2015    Procedure: Lower Extremity Angiography;  Surgeon: Serafina Mitchell, MD;  Location: [place] CV LAB;  Service: Cardiovascular;  Laterality: Bilateral;  . Femoral-popliteal bypass graft Left 07/28/2015    Procedure: LEFT FEMORAL-POPLITEAL ARTERY BYPASS GRAFT;  Surgeon: Rosetta Posner, MD;  Location: [name];  Service: Vascular;  Laterality: Left;  . Laparotomy N/A 12/01/2015    Procedure: EXPLORATORY LAPAROTOMY WITH RADICAL PERITONEAL DEBRIDEMENT, CLOSURE OF SMALL BOWEL PERFORATION AND LYSIS OF ADHESIONS.;  Surgeon: Johnathan Hausen, MD;  Location: [facility];  Service: General;  Laterality: N/A;    Allergies  Allergen Reactions  . Zetia [Ezetimibe] Anaphylaxis and Swelling    Tongue and throat   . Atorvastatin Other (See Comments)    Malaise & muscle weakness  . Penicillins Other (See Comments)    Unknown.  Tolerates cefepime  . Pravastatin Sodium     Pravastatin 40 mg qday and 40 mg q M/W/F caused muscle aches  . Lisinopril Rash  . Promethazine Rash    Current Outpatient Prescriptions  Medication Sig Dispense Refill  . acetaminophen (TYLENOL) 325 MG tablet Take 2 tablets (650 mg total) by mouth every 6 (six) hours as needed for fever. 30 tablet 0  . albuterol (PROVENTIL HFA;VENTOLIN HFA) 108 (90 BASE) MCG/ACT inhaler Inhale 2 puffs into the lungs every 6 (six) hours as needed for wheezing or shortness of breath. 1 Inhaler 2  . alum & mag hydroxide-simeth (MAALOX/MYLANTA) 200-200-20 MG/5ML suspension Take 30 mLs by mouth every 6 (six) hours as needed for indigestion or heartburn (or bloating). 355 mL 0  . antiseptic oral rinse (CPC / CETYLPYRIDINIUM CHLORIDE 0.05%) 0.05 % LIQD solution 7 mLs by Mouth Rinse route 2 times daily at 12 noon and 4 pm. 30 mL 0  . bisacodyl (DULCOLAX) 10 MG suppository Place 1 suppository (10 mg total) rectally every 12 (twelve) hours as needed for mild constipation or moderate constipation. 12 suppository 0  . collagenase (SANTYL) ointment Apply thin layer to wound bed of left foot daily (Patient taking differently: Apply 1 application topically daily. Apply thin layer to wound bed of left foot daily) 30 g 1  . dicyclomine (BENTYL) 20 MG tablet Take 1 tablet (20 mg total) by mouth 2 (two) times daily. 20 tablet 0  . diphenhydrAMINE (BENADRYL)  25 mg capsule Take 1 capsule (25 mg total) by mouth every 6 (six) hours as needed for itching, allergies or sleep (anxiety). 30 capsule 0  . feeding supplement, ENSURE ENLIVE, (ENSURE ENLIVE) LIQD Take 237 mLs by mouth 3 (three) times daily between meals. 237 mL 12  . gabapentin (NEURONTIN) 300 MG capsule Take 300 mg by mouth 3 (three) times daily.    . insulin aspart (NOVOLOG) 100 UNIT/ML injection Inject 0-15 Units into the skin 3 (three) times daily with meals. (Patient taking differently: Inject 5 Units into the skin 3 (three) times daily with meals. ) 10 mL 11  . magnesium oxide (MAG-OX) 400 (241.3 Mg) MG tablet Take 1 tablet (400 mg total) by mouth 2 (two) times daily. 60 tablet 0  . methocarbamol (ROBAXIN) 500 MG tablet Take 2 tablets (1,000 mg total) by mouth 3 (three) times daily. 90 tablet 0  . mirtazapine (REMERON) 15 MG tablet Take 15 mg by mouth at bedtime.    Marland Kitchen  ondansetron (ZOFRAN) 4 MG tablet Take 1 tablet (4 mg total) by mouth every 6 (six) hours. 12 tablet 0  . oxyCODONE 20 MG TABS Take 1 tablet (20 mg total) by mouth every 4 (four) hours as needed for moderate pain. 30 tablet 0  . Oxycodone HCl 10 MG TABS Take two tablets by mouth every 4 hours as needed for pain 360 tablet 0  . Sennosides (SENNA-LAX PO) Take 2 tablets by mouth daily.    Marland Kitchen thiamine 100 MG tablet Take 1 tablet (100 mg total) by mouth daily. 30 tablet 0  . triamcinolone cream (KENALOG) 0.1 % Apply 1 application topically 2 (two) times daily as needed (for eczema).    . vitamin C (ASCORBIC ACID) 500 MG tablet Take 500 mg by mouth 2 (two) times daily.    Marland Kitchen zinc sulfate 220 MG capsule Take 220 mg by mouth daily. X 45 days until 02/23/16     No current facility-administered medications for this visit.    ROS: See HPI for pertinent positives and negatives.   Physical Examination  Filed Vitals:   05/08/16 1422  BP: 119/82  Pulse: 77  Temp: 97.4 F (36.3 C)  Resp: 18  Height: 6\' 3"  (1.905 m)  Weight: 131 lb  (59.421 kg)  SpO2: 98%   Body mass index is 16.37 kg/(m^2).  General: A&O x 3, WDWN, thin male. Gait: slow, using walking stick Eyes: Pupils are equal Pulmonary: Respirations are non labored Cardiac: Regular rythm                    VASCULAR EXAM: Aorta is not palpable. Radial pulses: 2+ palpable and =      Extremities with ischemic changes: scant amount of foul smelling seepage from left great toe scab at the dorsum of toe,  No gangrene; no open wounds.                                                                                                            LE Pulses Right Left       FEMORAL  3+ palpable  3+ palpable        POPLITEAL  not palpable   not palpable       POSTERIOR TIBIAL Not symptomatic, no signs of ischemia   not palpable, no Doppler signal        DORSALIS PEDIS      ANTERIOR TIBIAL Not symptomatic,  no signs of ischemia  not palpable, no Doppler signal        PERONEAL Not symptomatic,  no signs of ischemia  not Palpable, moderate Doppler signal    Abdomen: soft, NT, no palpable masses. Skin: no rashes, see Extremities Musculoskeletal: no muscle wasting or atrophy.  Neurologic: A&O X 3; Appropriate Affect ; SENSATION: normal; MOTOR FUNCTION:  moving all extremities equally, motor strength 5/5 throughout. Speech is fluent/normal.  CN 2-12 grossly intact.    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 Significant tortuosity in the proximal segment with no flow noted in the mid-thigh.  Flow is noted again in the proximal calf with waveforms becoming monophasic and indicating returned flow may be native vessel vs. graft flow.       No previous ultrasound available for comparison.     ASSESSMENT: TREVIONNE SANTOR is a 65 y.o. male who is s/p left femoral to below-knee popliteal bypass with saphenous vein on 07/28/2015.  He returns  today with c/o pain in left great toe since he stubbed it last week; he denies pain or any problems in left great toe or left foot until he stubbed the toe. He has foul smelling seepage from the scab at the dorsum or his left great toe, no open wounds, no swelling.  Left LE arterial duplex performed today suggests significant tortuosity in the proximal segment with no flow noted in the mid-thigh.  Flow is noted again in the proximal calf with waveforms becoming monophasic and indicating returned flow may be native vessel vs. graft flow.   Dr. Scot Dock spoke with and examined pt, and advised that pt see Dr. Donnetta Hutching ASAP before an angiogram is scheduled, see Plan.  Pt's atherosclerotic risk factors include well controlled DM currently and continued smoking, currently 1/2 ppd, ongoing for about 46 years. He is not taking a statin, not taking ASA or any antiplatelet agent.   PLAN:  Based on the patient's vascular studies and examination, pt will return to clinic on 05/14/16 on my schedule since Dr. Luther Parody schedule is full that day, I will ask Dr. Donnetta Hutching to speak with pt that day.  Tramadol 50 mg, 1-2 tabs po q6 hours prn pain, disp #40, 0 refills. Keflex 500 mg tid x 2 weeks, disp #42, 0 refills.  The patient was counseled re smoking cessation and given several free resources re smoking cessation.   I discussed in depth with the patient the nature of atherosclerosis, and emphasized the importance of maximal medical management including strict control of blood pressure, blood glucose, and lipid levels, obtaining regular exercise, and cessation of smoking.  The patient is aware that without maximal medical management the underlying atherosclerotic disease process will progress, limiting the benefit of any interventions.  The patient was given information about PAD including signs, symptoms, treatment, what symptoms should prompt the patient to seek immediate medical care, and risk reduction measures to  take.  Clemon Chambers, RN, MSN, FNP-C [facility] Phone: [phone]  Clinic MD: Oneida Alar  05/08/2016 2:26 PM

## 2016-05-08 NOTE — Progress Notes (Addendum)
Pt stated he is having left great toe pain a 8/10. There does appear to be a scabbed area on the top of the nail bed. Toenail is absent. Positive pedal pulse w/o reddness but some warmth noted . Pt denies any fevers and has been followed by [facility] Sil[facility] in the past for Diabetic foot ulcers. Pt is on the phone with the vascular clinic requesting pain medication. (1:25pm)Pt was given names of doctors and the pain clinic-Pt then hugged a surgeon and asked him if he would write him a pain med RX.

## 2016-05-08 NOTE — Patient Instructions (Signed)
Peripheral Vascular Disease Peripheral vascular disease (PVD) is a disease of the blood vessels that are not part of your heart and brain. A simple term for PVD is poor circulation. In most cases, PVD narrows the blood vessels that carry blood from your heart to the rest of your body. This can result in a decreased supply of blood to your arms, legs, and internal organs, like your stomach or kidneys. However, it most often affects a person's lower legs and feet. There are two types of PVD.  Organic PVD. This is the more common type. It is caused by damage to the structure of blood vessels.  Functional PVD. This is caused by conditions that make blood vessels contract and tighten (spasm). Without treatment, PVD tends to get worse over time. PVD can also lead to acute ischemic limb. This is when an arm or limb suddenly has trouble getting enough blood. This is a medical emergency. CAUSES Each type of PVD has many different causes. The most common cause of PVD is buildup of a fatty material (plaque) inside of your arteries (atherosclerosis). Small amounts of plaque can break off from the walls of the blood vessels and become lodged in a smaller artery. This blocks blood flow and can cause acute ischemic limb. Other common causes of PVD include:  Blood clots that form inside of blood vessels.  Injuries to blood vessels.  Diseases that cause inflammation of blood vessels or cause blood vessel spasms.  Health behaviors and health history that increase your risk of developing PVD. RISK FACTORS  You may have a greater risk of PVD if you:  Have a family history of PVD.  Have certain medical conditions, including:  High cholesterol.  Diabetes.  High blood pressure (hypertension).  Coronary heart disease.  Past problems with blood clots.  Past injury, such as burns or a broken bone. These may have damaged blood vessels in your limbs.  Buerger disease. This is caused by inflamed blood  vessels in your hands and feet.  Some forms of arthritis.  Rare birth defects that affect the arteries in your legs.  Use tobacco.  Do not get enough exercise.  Are obese.  Are age 50 or older. SIGNS AND SYMPTOMS  PVD may cause many different symptoms. Your symptoms depend on what part of your body is not getting enough blood. Some common signs and symptoms include:  Cramps in your lower legs. This may be a symptom of poor leg circulation (claudication).  Pain and weakness in your legs while you are physically active that goes away when you rest (intermittent claudication).  Leg pain when at rest.  Leg numbness, tingling, or weakness.  Coldness in a leg or foot, especially when compared with the other leg.  Skin or hair changes. These can include:  Hair loss.  Shiny skin.  Pale or bluish skin.  Thick toenails.  Inability to get or maintain an erection (erectile dysfunction). People with PVD are more prone to developing ulcers and sores on their toes, feet, or legs. These may take longer than normal to heal. DIAGNOSIS Your health care provider may diagnose PVD from your signs and symptoms. The health care provider will also do a physical exam. You may have tests to find out what is causing your PVD and determine its severity. Tests may include:  Blood pressure recordings from your arms and legs and measurements of the strength of your pulses (pulse volume recordings).  Imaging studies using sound waves to take pictures of   the blood flow through your blood vessels (Doppler ultrasound).  Injecting a dye into your blood vessels before having imaging studies using:  X-rays (angiogram or arteriogram).  Computer-generated X-rays (CT angiogram).  A powerful electromagnetic field and a computer (magnetic resonance angiogram or MRA). TREATMENT Treatment for PVD depends on the cause of your condition and the severity of your symptoms. It also depends on your age. Underlying  causes need to be treated and controlled. These include long-lasting (chronic) conditions, such as diabetes, high cholesterol, and high blood pressure. You may need to first try making lifestyle changes and taking medicines. Surgery may be needed if these do not work. Lifestyle changes may include:  Quitting smoking.  Exercising regularly.  Following a low-fat, low-cholesterol diet. Medicines may include:  Blood thinners to prevent blood clots.  Medicines to improve blood flow.  Medicines to improve your blood cholesterol levels. Surgical procedures may include:  A procedure that uses an inflated balloon to open a blocked artery and improve blood flow (angioplasty).  A procedure to put in a tube (stent) to keep a blocked artery open (stent implant).  Surgery to reroute blood flow around a blocked artery (peripheral bypass surgery).  Surgery to remove dead tissue from an infected wound on the affected limb.  Amputation. This is surgical removal of the affected limb. This may be necessary in cases of acute ischemic limb that are not improved through medical or surgical treatments. HOME CARE INSTRUCTIONS  Take medicines only as directed by your health care provider.  Do not use any tobacco products, including cigarettes, chewing tobacco, or electronic cigarettes. If you need help quitting, ask your health care provider.  Lose weight if you are overweight, and maintain a healthy weight as directed by your health care provider.  Eat a diet that is low in fat and cholesterol. If you need help, ask your health care provider.  Exercise regularly. Ask your health care provider to suggest some good activities for you.  Use compression stockings or other mechanical devices as directed by your health care provider.  Take good care of your feet.  Wear comfortable shoes that fit well.  Check your feet often for any cuts or sores. SEEK MEDICAL CARE IF:  You have cramps in your legs  while walking.  You have leg pain when you are at rest.  You have coldness in a leg or foot.  Your skin changes.  You have erectile dysfunction.  You have cuts or sores on your feet that are not healing. SEEK IMMEDIATE MEDICAL CARE IF:  Your arm or leg turns cold and blue.  Your arms or legs become red, warm, swollen, painful, or numb.  You have chest pain or trouble breathing.  You suddenly have weakness in your face, arm, or leg.  You become very confused or lose the ability to speak.  You suddenly have a very bad headache or lose your vision.   This information is not intended to replace advice given to you by your health care provider. Make sure you discuss any questions you have with your health care provider.   Document Released: 12/19/2004 Document Revised: 12/02/2014 Document Reviewed: 04/21/2014 Elsevier Interactive Patient Education 2016 [employer]     Steps to Quit Smoking  Smoking tobacco can be harmful to your health and can affect almost every organ in your body. Smoking puts you, and those around you, at risk for developing many serious chronic diseases. Quitting smoking is difficult, but it is one of   the best things that you can do for your health. It is never too late to quit. WHAT ARE THE BENEFITS OF QUITTING SMOKING? When you quit smoking, you lower your risk of developing serious diseases and conditions, such as:  Lung cancer or lung disease, such as COPD.  Heart disease.  Stroke.  Heart attack.  Infertility.  Osteoporosis and bone fractures. Additionally, symptoms such as coughing, wheezing, and shortness of breath may get better when you quit. You may also find that you get sick less often because your body is stronger at fighting off colds and infections. If you are pregnant, quitting smoking can help to reduce your chances of having a baby of low birth weight. HOW DO I GET READY TO QUIT? When you decide to quit smoking, create a plan to  make sure that you are successful. Before you quit:  Pick a date to quit. Set a date within the next two weeks to give you time to prepare.  Write down the reasons why you are quitting. Keep this list in places where you will see it often, such as on your bathroom mirror or in your car or wallet.  Identify the people, places, things, and activities that make you want to smoke (triggers) and avoid them. Make sure to take these actions:  Throw away all cigarettes at home, at work, and in your car.  Throw away smoking accessories, such as ashtrays and lighters.  Clean your car and make sure to empty the ashtray.  Clean your home, including curtains and carpets.  Tell your family, friends, and coworkers that you are quitting. Support from your loved ones can make quitting easier.  Talk with your health care provider about your options for quitting smoking.  Find out what treatment options are covered by your health insurance. WHAT STRATEGIES CAN I USE TO QUIT SMOKING?  Talk with your healthcare provider about different strategies to quit smoking. Some strategies include:  Quitting smoking altogether instead of gradually lessening how much you smoke over a period of time. Research shows that quitting "cold turkey" is more successful than gradually quitting.  Attending in-person counseling to help you build problem-solving skills. You are more likely to have success in quitting if you attend several counseling sessions. Even short sessions of 10 minutes can be effective.  Finding resources and support systems that can help you to quit smoking and remain smoke-free after you quit. These resources are most helpful when you use them often. They can include:  Online chats with a counselor.  Telephone quitlines.  Printed self-help materials.  Support groups or group counseling.  Text messaging programs.  Mobile phone applications.  Taking medicines to help you quit smoking. (If you are  pregnant or breastfeeding, talk with your health care provider first.) Some medicines contain nicotine and some do not. Both types of medicines help with cravings, but the medicines that include nicotine help to relieve withdrawal symptoms. Your health care provider may recommend:  Nicotine patches, gum, or lozenges.  Nicotine inhalers or sprays.  Non-nicotine medicine that is taken by mouth. Talk with your health care provider about combining strategies, such as taking medicines while you are also receiving in-person counseling. Using these two strategies together makes you more likely to succeed in quitting than if you used either strategy on its own. If you are pregnant or breastfeeding, talk with your health care provider about finding counseling or other support strategies to quit smoking. Do not take medicine to help you   quit smoking unless told to do so by your health care provider. WHAT THINGS CAN I DO TO MAKE IT EASIER TO QUIT? Quitting smoking might feel overwhelming at first, but there is a lot that you can do to make it easier. Take these important actions:  Reach out to your family and friends and ask that they support and encourage you during this time. Call telephone quitlines, reach out to support groups, or work with a counselor for support.  Ask people who smoke to avoid smoking around you.  Avoid places that trigger you to smoke, such as bars, parties, or smoke-break areas at work.  Spend time around people who do not smoke.  Lessen stress in your life, because stress can be a smoking trigger for some people. To lessen stress, try:  Exercising regularly.  Deep-breathing exercises.  Yoga.  Meditating.  Performing a body scan. This involves closing your eyes, scanning your body from head to toe, and noticing which parts of your body are particularly tense. Purposefully relax the muscles in those areas.  Download or purchase mobile phone or tablet apps (applications)  that can help you stick to your quit plan by providing reminders, tips, and encouragement. There are many free apps, such as QuitGuide from the CDC (Centers for Disease Control and Prevention). You can find other support for quitting smoking (smoking cessation) through smokefree.gov and other websites. HOW WILL I FEEL WHEN I QUIT SMOKING? Within the first 24 hours of quitting smoking, you may start to feel some withdrawal symptoms. These symptoms are usually most noticeable 2-3 days after quitting, but they usually do not last beyond 2-3 weeks. Changes or symptoms that you might experience include:  Mood swings.  Restlessness, anxiety, or irritation.  Difficulty concentrating.  Dizziness.  Strong cravings for sugary foods in addition to nicotine.  Mild weight gain.  Constipation.  Nausea.  Coughing or a sore throat.  Changes in how your medicines work in your body.  A depressed mood.  Difficulty sleeping (insomnia). After the first 2-3 weeks of quitting, you may start to notice more positive results, such as:  Improved sense of smell and taste.  Decreased coughing and sore throat.  Slower heart rate.  Lower blood pressure.  Clearer skin.  The ability to breathe more easily.  Fewer sick days. Quitting smoking is very challenging for most people. Do not get discouraged if you are not successful the first time. Some people need to make many attempts to quit before they achieve long-term success. Do your best to stick to your quit plan, and talk with your health care provider if you have any questions or concerns.   This information is not intended to replace advice given to you by your health care provider. Make sure you discuss any questions you have with your health care provider.   Document Released: 11/05/2001 Document Revised: 03/28/2015 Document Reviewed: 03/28/2015 Elsevier Interactive Patient Education 2016 [employer]  

## 2016-05-08 NOTE — ED Notes (Signed)
Pt had surgery for ulcer repair in January.  Pt states that he has been on opiates for the last several years for pain.  Most recent Rx was for Oxycontin 15mg  q12hrs when he had been taking 125mg  a day.  RN explained to Pt the limitations of ED Rx.

## 2016-05-10 ENCOUNTER — Telehealth: Payer: Self-pay

## 2016-05-10 NOTE — Telephone Encounter (Signed)
Pt. called and stated he saw the nurse practitioner yesterday, and was prescribed Tramadol.  Stated he has to take 4 pills at a time, and questioned if this is dangerous.   Advised pt. That he is over dosing with taking Tramadol 50 mg, 4 tabs at one time.  Advised to have him take as prescribed, 1-2 tablets q 6 hrs/ prn.  Stated "You know I'm addicted to Opioids don't you?  Questioned if he was under a pain contract with a pain clinic?  The pt. stated he has been referred to a Pain Clinic, but hasn't received a call yet.  The pt. Questioned if Dr. Donnetta Hutching or Dr. Scot Dock would give him Oxycodone?  Advised that our office will not be able to prescribe anything stronger for pain, as he received 40 tablets Tramadol 6/15.  The pt. Said "okay", and hung up.

## 2016-05-13 ENCOUNTER — Encounter: Payer: Self-pay | Admitting: Family

## 2016-05-14 ENCOUNTER — Encounter: Payer: Self-pay | Admitting: Family

## 2016-05-14 ENCOUNTER — Ambulatory Visit (INDEPENDENT_AMBULATORY_CARE_PROVIDER_SITE_OTHER): Payer: Medicaid Other | Admitting: Family

## 2016-05-14 VITALS — BP 135/88 | HR 68 | Temp 98.2°F | Resp 16 | Ht 75.0 in | Wt 130.0 lb

## 2016-05-14 DIAGNOSIS — Z95828 Presence of other vascular implants and grafts: Secondary | ICD-10-CM | POA: Diagnosis not present

## 2016-05-14 DIAGNOSIS — I779 Disorder of arteries and arterioles, unspecified: Secondary | ICD-10-CM | POA: Diagnosis not present

## 2016-05-14 DIAGNOSIS — T82392D Other mechanical complication of femoral arterial graft (bypass), subsequent encounter: Secondary | ICD-10-CM | POA: Diagnosis not present

## 2016-05-14 DIAGNOSIS — Z720 Tobacco use: Secondary | ICD-10-CM | POA: Diagnosis not present

## 2016-05-14 DIAGNOSIS — T82898D Other specified complication of vascular prosthetic devices, implants and grafts, subsequent encounter: Secondary | ICD-10-CM

## 2016-05-14 DIAGNOSIS — F17200 Nicotine dependence, unspecified, uncomplicated: Secondary | ICD-10-CM

## 2016-05-14 DIAGNOSIS — E1151 Type 2 diabetes mellitus with diabetic peripheral angiopathy without gangrene: Secondary | ICD-10-CM

## 2016-05-14 NOTE — Patient Instructions (Signed)
Peripheral Vascular Disease Peripheral vascular disease (PVD) is a disease of the blood vessels that are not part of your heart and brain. A simple term for PVD is poor circulation. In most cases, PVD narrows the blood vessels that carry blood from your heart to the rest of your body. This can result in a decreased supply of blood to your arms, legs, and internal organs, like your stomach or kidneys. However, it most often affects a person's lower legs and feet. There are two types of PVD.  Organic PVD. This is the more common type. It is caused by damage to the structure of blood vessels.  Functional PVD. This is caused by conditions that make blood vessels contract and tighten (spasm). Without treatment, PVD tends to get worse over time. PVD can also lead to acute ischemic limb. This is when an arm or limb suddenly has trouble getting enough blood. This is a medical emergency. CAUSES Each type of PVD has many different causes. The most common cause of PVD is buildup of a fatty material (plaque) inside of your arteries (atherosclerosis). Small amounts of plaque can break off from the walls of the blood vessels and become lodged in a smaller artery. This blocks blood flow and can cause acute ischemic limb. Other common causes of PVD include:  Blood clots that form inside of blood vessels.  Injuries to blood vessels.  Diseases that cause inflammation of blood vessels or cause blood vessel spasms.  Health behaviors and health history that increase your risk of developing PVD. RISK FACTORS  You may have a greater risk of PVD if you:  Have a family history of PVD.  Have certain medical conditions, including:  High cholesterol.  Diabetes.  High blood pressure (hypertension).  Coronary heart disease.  Past problems with blood clots.  Past injury, such as burns or a broken bone. These may have damaged blood vessels in your limbs.  Buerger disease. This is caused by inflamed blood  vessels in your hands and feet.  Some forms of arthritis.  Rare birth defects that affect the arteries in your legs.  Use tobacco.  Do not get enough exercise.  Are obese.  Are age 50 or older. SIGNS AND SYMPTOMS  PVD may cause many different symptoms. Your symptoms depend on what part of your body is not getting enough blood. Some common signs and symptoms include:  Cramps in your lower legs. This may be a symptom of poor leg circulation (claudication).  Pain and weakness in your legs while you are physically active that goes away when you rest (intermittent claudication).  Leg pain when at rest.  Leg numbness, tingling, or weakness.  Coldness in a leg or foot, especially when compared with the other leg.  Skin or hair changes. These can include:  Hair loss.  Shiny skin.  Pale or bluish skin.  Thick toenails.  Inability to get or maintain an erection (erectile dysfunction). People with PVD are more prone to developing ulcers and sores on their toes, feet, or legs. These may take longer than normal to heal. DIAGNOSIS Your health care provider may diagnose PVD from your signs and symptoms. The health care provider will also do a physical exam. You may have tests to find out what is causing your PVD and determine its severity. Tests may include:  Blood pressure recordings from your arms and legs and measurements of the strength of your pulses (pulse volume recordings).  Imaging studies using sound waves to take pictures of   the blood flow through your blood vessels (Doppler ultrasound).  Injecting a dye into your blood vessels before having imaging studies using:  X-rays (angiogram or arteriogram).  Computer-generated X-rays (CT angiogram).  A powerful electromagnetic field and a computer (magnetic resonance angiogram or MRA). TREATMENT Treatment for PVD depends on the cause of your condition and the severity of your symptoms. It also depends on your age. Underlying  causes need to be treated and controlled. These include long-lasting (chronic) conditions, such as diabetes, high cholesterol, and high blood pressure. You may need to first try making lifestyle changes and taking medicines. Surgery may be needed if these do not work. Lifestyle changes may include:  Quitting smoking.  Exercising regularly.  Following a low-fat, low-cholesterol diet. Medicines may include:  Blood thinners to prevent blood clots.  Medicines to improve blood flow.  Medicines to improve your blood cholesterol levels. Surgical procedures may include:  A procedure that uses an inflated balloon to open a blocked artery and improve blood flow (angioplasty).  A procedure to put in a tube (stent) to keep a blocked artery open (stent implant).  Surgery to reroute blood flow around a blocked artery (peripheral bypass surgery).  Surgery to remove dead tissue from an infected wound on the affected limb.  Amputation. This is surgical removal of the affected limb. This may be necessary in cases of acute ischemic limb that are not improved through medical or surgical treatments. HOME CARE INSTRUCTIONS  Take medicines only as directed by your health care provider.  Do not use any tobacco products, including cigarettes, chewing tobacco, or electronic cigarettes. If you need help quitting, ask your health care provider.  Lose weight if you are overweight, and maintain a healthy weight as directed by your health care provider.  Eat a diet that is low in fat and cholesterol. If you need help, ask your health care provider.  Exercise regularly. Ask your health care provider to suggest some good activities for you.  Use compression stockings or other mechanical devices as directed by your health care provider.  Take good care of your feet.  Wear comfortable shoes that fit well.  Check your feet often for any cuts or sores. SEEK MEDICAL CARE IF:  You have cramps in your legs  while walking.  You have leg pain when you are at rest.  You have coldness in a leg or foot.  Your skin changes.  You have erectile dysfunction.  You have cuts or sores on your feet that are not healing. SEEK IMMEDIATE MEDICAL CARE IF:  Your arm or leg turns cold and blue.  Your arms or legs become red, warm, swollen, painful, or numb.  You have chest pain or trouble breathing.  You suddenly have weakness in your face, arm, or leg.  You become very confused or lose the ability to speak.  You suddenly have a very bad headache or lose your vision.   This information is not intended to replace advice given to you by your health care provider. Make sure you discuss any questions you have with your health care provider.   Document Released: 12/19/2004 Document Revised: 12/02/2014 Document Reviewed: 04/21/2014 Elsevier Interactive Patient Education 2016 [employer]     Steps to Quit Smoking  Smoking tobacco can be harmful to your health and can affect almost every organ in your body. Smoking puts you, and those around you, at risk for developing many serious chronic diseases. Quitting smoking is difficult, but it is one of   the best things that you can do for your health. It is never too late to quit. WHAT ARE THE BENEFITS OF QUITTING SMOKING? When you quit smoking, you lower your risk of developing serious diseases and conditions, such as:  Lung cancer or lung disease, such as COPD.  Heart disease.  Stroke.  Heart attack.  Infertility.  Osteoporosis and bone fractures. Additionally, symptoms such as coughing, wheezing, and shortness of breath may get better when you quit. You may also find that you get sick less often because your body is stronger at fighting off colds and infections. If you are pregnant, quitting smoking can help to reduce your chances of having a baby of low birth weight. HOW DO I GET READY TO QUIT? When you decide to quit smoking, create a plan to  make sure that you are successful. Before you quit:  Pick a date to quit. Set a date within the next two weeks to give you time to prepare.  Write down the reasons why you are quitting. Keep this list in places where you will see it often, such as on your bathroom mirror or in your car or wallet.  Identify the people, places, things, and activities that make you want to smoke (triggers) and avoid them. Make sure to take these actions:  Throw away all cigarettes at home, at work, and in your car.  Throw away smoking accessories, such as ashtrays and lighters.  Clean your car and make sure to empty the ashtray.  Clean your home, including curtains and carpets.  Tell your family, friends, and coworkers that you are quitting. Support from your loved ones can make quitting easier.  Talk with your health care provider about your options for quitting smoking.  Find out what treatment options are covered by your health insurance. WHAT STRATEGIES CAN I USE TO QUIT SMOKING?  Talk with your healthcare provider about different strategies to quit smoking. Some strategies include:  Quitting smoking altogether instead of gradually lessening how much you smoke over a period of time. Research shows that quitting "cold turkey" is more successful than gradually quitting.  Attending in-person counseling to help you build problem-solving skills. You are more likely to have success in quitting if you attend several counseling sessions. Even short sessions of 10 minutes can be effective.  Finding resources and support systems that can help you to quit smoking and remain smoke-free after you quit. These resources are most helpful when you use them often. They can include:  Online chats with a counselor.  Telephone quitlines.  Printed self-help materials.  Support groups or group counseling.  Text messaging programs.  Mobile phone applications.  Taking medicines to help you quit smoking. (If you are  pregnant or breastfeeding, talk with your health care provider first.) Some medicines contain nicotine and some do not. Both types of medicines help with cravings, but the medicines that include nicotine help to relieve withdrawal symptoms. Your health care provider may recommend:  Nicotine patches, gum, or lozenges.  Nicotine inhalers or sprays.  Non-nicotine medicine that is taken by mouth. Talk with your health care provider about combining strategies, such as taking medicines while you are also receiving in-person counseling. Using these two strategies together makes you more likely to succeed in quitting than if you used either strategy on its own. If you are pregnant or breastfeeding, talk with your health care provider about finding counseling or other support strategies to quit smoking. Do not take medicine to help you   quit smoking unless told to do so by your health care provider. WHAT THINGS CAN I DO TO MAKE IT EASIER TO QUIT? Quitting smoking might feel overwhelming at first, but there is a lot that you can do to make it easier. Take these important actions:  Reach out to your family and friends and ask that they support and encourage you during this time. Call telephone quitlines, reach out to support groups, or work with a counselor for support.  Ask people who smoke to avoid smoking around you.  Avoid places that trigger you to smoke, such as bars, parties, or smoke-break areas at work.  Spend time around people who do not smoke.  Lessen stress in your life, because stress can be a smoking trigger for some people. To lessen stress, try:  Exercising regularly.  Deep-breathing exercises.  Yoga.  Meditating.  Performing a body scan. This involves closing your eyes, scanning your body from head to toe, and noticing which parts of your body are particularly tense. Purposefully relax the muscles in those areas.  Download or purchase mobile phone or tablet apps (applications)  that can help you stick to your quit plan by providing reminders, tips, and encouragement. There are many free apps, such as QuitGuide from the CDC (Centers for Disease Control and Prevention). You can find other support for quitting smoking (smoking cessation) through smokefree.gov and other websites. HOW WILL I FEEL WHEN I QUIT SMOKING? Within the first 24 hours of quitting smoking, you may start to feel some withdrawal symptoms. These symptoms are usually most noticeable 2-3 days after quitting, but they usually do not last beyond 2-3 weeks. Changes or symptoms that you might experience include:  Mood swings.  Restlessness, anxiety, or irritation.  Difficulty concentrating.  Dizziness.  Strong cravings for sugary foods in addition to nicotine.  Mild weight gain.  Constipation.  Nausea.  Coughing or a sore throat.  Changes in how your medicines work in your body.  A depressed mood.  Difficulty sleeping (insomnia). After the first 2-3 weeks of quitting, you may start to notice more positive results, such as:  Improved sense of smell and taste.  Decreased coughing and sore throat.  Slower heart rate.  Lower blood pressure.  Clearer skin.  The ability to breathe more easily.  Fewer sick days. Quitting smoking is very challenging for most people. Do not get discouraged if you are not successful the first time. Some people need to make many attempts to quit before they achieve long-term success. Do your best to stick to your quit plan, and talk with your health care provider if you have any questions or concerns.   This information is not intended to replace advice given to you by your health care provider. Make sure you discuss any questions you have with your health care provider.   Document Released: 11/05/2001 Document Revised: 03/28/2015 Document Reviewed: 03/28/2015 Elsevier Interactive Patient Education 2016 [employer]  

## 2016-05-14 NOTE — Progress Notes (Signed)
[facility] [place]   CC: Follow up peripheral artery occlusive disease  History of Present Illness [name] [name] is a 65 y.o. male patient of Dr. Donnetta Hutching who is s/p left femoral to below-knee popliteal bypass with saphenous vein on 07/28/2015.   Dr. Donnetta Hutching last saw pt in December 2016. At that time pt had excellent flow to his foot. He had an extremely large wound over the dorsum of his foot of great toe. Dr. Donnetta Hutching debrided this eschar at pt's November 2016 visit. He continued to have a nice granulating base on the dorsum of his foot. The wound was still very large but contracting. Dr. Donnetta Hutching advised pt to continue with local wound care. Pt advised to return in 3 weeks for continued follow-up.  Pt had not returned until last week. He returned last week with c/o reinjuring left big toe last week. He denies any pain or problems with his left foot or toe until he stubbed it last week. He is requesting a prescription for an analgesic.   After his visit last week he was prescribed Keflex to address possible infection in left great toe, had foul smelling seepage from a small scab, but no open wound. He was also prescribed tramadol to address his pain. Noted that he presented to the ED later that same day requesting an opioid prescription for his admitted opioid addiction.  He returns today to speak with Dr. Donnetta Hutching re results of last week LE arterial duplex: significant tortuosity in the proximal segment with no flow noted in the mid-thigh. Flow is noted again in the proximal calf with waveforms becoming monophasic and indicating returned flow may be native vessel vs. graft flow.   He had surgery in January 2017 for a small bowel obstruction.   Pt Diabetic: Yes, states his A1C is about 6.1, in good control Pt smoker: smoker (admits to 1/2 ppd x 46 yrs)  Pt meds include: Statin :No Betablocker: No ASA: No Other anticoagulants/antiplatelets: no    No past medical  history on file.  Social History Social History  Substance Use Topics  . Smoking status: Current Every Day Smoker -- 0.50 packs/day for 46 years    Types: Cigarettes  . Smokeless tobacco: Never Used  . Alcohol Use: 0.0 oz/week    0 Standard drinks or equivalent per week     Comment: [id number]/17/20214 "last drink was 3-4 yr ago; never had problem w/it"    Family History Family History  Problem Relation Age of Onset  . Hypertension Mother   . Heart attack Mother   . Diabetes Mother   . Aneurysm Father   . Heart attack Sister   . Heart disease Brother     heart transplant    Past Surgical History  Procedure Laterality Date  . Iliac artery stent  05/06/2011  . Tonsillectomy    . Aorta - bilateral femoral artery bypass graft  11/25/2011    Procedure: AORTA BIFEMORAL BYPASS GRAFT;  Surgeon: Rosetta Posner, MD;  Location: [facility];  Service: Vascular;  Laterality: N/A;  . Pr vein bypass graft,aorto-fem-pop  11/24/2012  . Inguinal hernia repair Right   . Abdominal aortagram N/A 11/11/2011    Procedure: ABDOMINAL Maxcine Ham;  Surgeon: Angelia Mould, MD;  Location: [facility] CATH LAB;  Service: Cardiovascular;  Laterality: N/A;  . Lower extremity angiogram Bilateral 11/11/2011    Procedure: LOWER EXTREMITY ANGIOGRAM;  Surgeon: Angelia Mould, MD;  Location: [facility] CATH LAB;  Service: Cardiovascular;  Laterality:  Bilateral;  . Left heart catheterization with coronary angiogram N/A 06/07/2013    Procedure: LEFT HEART CATHETERIZATION WITH CORONARY ANGIOGRAM;  Surgeon: Sherren Mocha, MD;  Location: [facility] CATH LAB;  Service: Cardiovascular;  Laterality: N/A;  . Percutaneous coronary stent intervention (pci-s) Right 06/07/2013    Procedure: PERCUTANEOUS CORONARY STENT INTERVENTION (PCI-S);  Surgeon: Sherren Mocha, MD;  Location: [facility] CATH LAB;  Service: Cardiovascular;  Laterality: Right;  . Left heart catheterization with coronary angiogram N/A 12/10/2013    Procedure: LEFT HEART CATHETERIZATION WITH  CORONARY ANGIOGRAM;  Surgeon: Blane Ohara, MD;  Location: [facility] CATH LAB;  Service: Cardiovascular;  Laterality: N/A;  . Colonoscopy    . Peripheral vascular catheterization N/A 07/26/2015    Procedure: Abdominal Aortogram;  Surgeon: Serafina Mitchell, MD;  Location: [place] CV LAB;  Service: Cardiovascular;  Laterality: N/A;  . Peripheral vascular catheterization Bilateral 07/26/2015    Procedure: Lower Extremity Angiography;  Surgeon: Serafina Mitchell, MD;  Location: [place] CV LAB;  Service: Cardiovascular;  Laterality: Bilateral;  . Femoral-popliteal bypass graft Left 07/28/2015    Procedure: LEFT FEMORAL-POPLITEAL ARTERY BYPASS GRAFT;  Surgeon: Rosetta Posner, MD;  Location: [place];  Service: Vascular;  Laterality: Left;  . Laparotomy N/A 12/01/2015    Procedure: EXPLORATORY LAPAROTOMY WITH RADICAL PERITONEAL DEBRIDEMENT, CLOSURE OF SMALL BOWEL PERFORATION AND LYSIS OF ADHESIONS.;  Surgeon: Johnathan Hausen, MD;  Location: [facility] ORS;  Service: General;  Laterality: N/A;    Allergies  Allergen Reactions  . Zetia [Ezetimibe] Anaphylaxis and Swelling    Tongue and throat   . Atorvastatin Other (See Comments)    Malaise & muscle weakness  . Penicillins Other (See Comments)    Unknown.  Tolerates cefepime  . Pravastatin Sodium     Pravastatin 40 mg qday and 40 mg q M/W/F caused muscle aches  . Lisinopril Rash  . Promethazine Rash    Current Outpatient Prescriptions  Medication Sig Dispense Refill  . acetaminophen (TYLENOL) 325 MG tablet Take 2 tablets (650 mg total) by mouth every 6 (six) hours as needed for fever. 30 tablet 0  . albuterol (PROVENTIL HFA;VENTOLIN HFA) 108 (90 BASE) MCG/ACT inhaler Inhale 2 puffs into the lungs every 6 (six) hours as needed for wheezing or shortness of breath. 1 Inhaler 2  . alum & mag hydroxide-simeth (MAALOX/MYLANTA) 200-200-20 MG/5ML suspension Take 30 mLs by mouth every 6 (six) hours as needed for indigestion or heartburn (or bloating). 355 mL 0  .  antiseptic oral rinse (CPC / CETYLPYRIDINIUM CHLORIDE 0.05%) 0.05 % LIQD solution 7 mLs by Mouth Rinse route 2 times daily at 12 noon and 4 pm. 30 mL 0  . bisacodyl (DULCOLAX) 10 MG suppository Place 1 suppository (10 mg total) rectally every 12 (twelve) hours as needed for mild constipation or moderate constipation. 12 suppository 0  . cephALEXin (KEFLEX) 500 MG capsule Take 1 capsule (500 mg total) by mouth 3 (three) times daily. 42 capsule 0  . collagenase (SANTYL) ointment Apply thin layer to wound bed of left foot daily 30 g 1  . dicyclomine (BENTYL) 20 MG tablet Take 1 tablet (20 mg total) by mouth 2 (two) times daily. 20 tablet 0  . diphenhydrAMINE (BENADRYL) 25 mg capsule Take 1 capsule (25 mg total) by mouth every 6 (six) hours as needed for itching, allergies or sleep (anxiety). 30 capsule 0  . feeding supplement, ENSURE ENLIVE, (ENSURE ENLIVE) LIQD Take 237 mLs by mouth 3 (three) times daily between meals. 237 mL 12  .  gabapentin (NEURONTIN) 300 MG capsule Take 300 mg by mouth 3 (three) times daily.    . insulin aspart (NOVOLOG) 100 UNIT/ML injection Inject 0-15 Units into the skin 3 (three) times daily with meals. 10 mL 11  . magnesium oxide (MAG-OX) 400 (241.3 Mg) MG tablet Take 1 tablet (400 mg total) by mouth 2 (two) times daily. 60 tablet 0  . methocarbamol (ROBAXIN) 500 MG tablet Take 2 tablets (1,000 mg total) by mouth 3 (three) times daily. 90 tablet 0  . mirtazapine (REMERON) 15 MG tablet Take 15 mg by mouth at bedtime. Reported on 05/08/2016    . ondansetron (ZOFRAN) 4 MG tablet Take 1 tablet (4 mg total) by mouth every 6 (six) hours. 12 tablet 0  . Sennosides (SENNA-LAX PO) Take 2 tablets by mouth daily.    Marland Kitchen thiamine 100 MG tablet Take 1 tablet (100 mg total) by mouth daily. 30 tablet 0  . traMADol (ULTRAM) 50 MG tablet Take 1-2 tablets by mouth every 6 hours as needed for pain 40 tablet 0  . triamcinolone cream (KENALOG) 0.1 % Apply 1 application topically 2 (two) times daily  as needed (for eczema).    . vitamin C (ASCORBIC ACID) 500 MG tablet Take 500 mg by mouth 2 (two) times daily.    Marland Kitchen zinc sulfate 220 MG capsule Take 220 mg by mouth daily. X 45 days until 02/23/16     No current facility-administered medications for this visit.    ROS: See HPI for pertinent positives and negatives.   Physical Examination  Filed Vitals:   05/14/16 0915  BP: 135/88  Pulse: 68  Temp: 98.2 F (36.8 C)  TempSrc: Oral  Resp: 16  Height: 6\' 3"  (1.905 m)  Weight: 130 lb (58.968 kg)  SpO2: 99%   Body mass index is 16.25 kg/(m^2).  General: A&O x 3, WDWN, thin male. Gait: slow, using walking stick Eyes: Pupils are equal Pulmonary: Respirations are non labored Cardiac: Regular rythm    VASCULAR EXAM: Aorta is not palpable. Radial pulses: 2+ palpable and =  Extremities with ischemic changes: scant amount of foul smelling seepage from left great toe scab at the dorsum of toe,  No gangrene; no open wounds.      LE Pulses Right Left   FEMORAL 3+ palpable 3+ palpable    POPLITEAL not palpable  not palpable   POSTERIOR TIBIAL Not symptomatic, no signs of ischemia  not palpable, no Doppler signal    DORSALIS PEDIS  ANTERIOR TIBIAL Not symptomatic, no signs of ischemia  not palpable, no Doppler signal    PERONEAL Not symptomatic, no signs of ischemia  not Palpable, moderate Doppler signal    Abdomen: soft, NT, no palpable masses. Skin: no rashes, see Extremities Musculoskeletal: no muscle wasting or atrophy. Neurologic: A&O X 3; Appropriate Affect ; SENSATION: normal; MOTOR FUNCTION: moving all extremities equally, motor strength 5/5 throughout. Speech is fluent/normal.  CN 2-12 grossly intact.    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 Significant tortuosity in the proximal segment with no flow noted in the mid-thigh. Flow is noted again in the proximal calf with waveforms becoming monophasic and indicating returned flow may be native vessel vs. graft flow.     No previous ultrasound available for comparison.           ASSESSMENT: KEIDYN FAGG is a 65 y.o. male who is  s/p left femoral to below-knee popliteal bypass with saphenous vein on 07/28/2015.  He returns today with c/o pain in left great toe since he stubbed it 2 weeks ago; he denies pain or any problems in left great toe or left foot until he stubbed the toe. He had foul smelling seepage from the scab at the dorsum or his left great toe a week ago, but no longer has the scab nor the foul smelling seepage. He states he has been taking Keflex that was prescribed for him at his visit last week. The left great toenail is missing, there is a small healing wound in the bed of the toenail the size of a pin head, no drainage.  Left LE arterial duplex performed 05/08/16 suggests significant tortuosity in the proximal segment with no flow noted in the mid-thigh. Flow is noted again in the proximal calf with waveforms becoming monophasic and indicating returned flow may be native vessel vs. graft flow. No previous left LE duplex for comparison.   Dr. Donnetta Hutching spoke with and examined pt and indicated that he is pleased to see that the large ulcer he had at the dorsum of his left foot has healed well, and that the small opening at his left toenail bed seems to be healing, does not seem infected.  Pt again requested a prescription for narcotic analgesic; we referred him to his PCP to address his chronic opioid use. Pt does not seem to have ischemic pain in the left foot.  Pt's atherosclerotic risk factors include well controlled DM currently  and continued smoking, currently 1/2 ppd, ongoing for about 46 years. He is not taking a statin, not taking ASA or any antiplatelet agent.   PLAN:  Based on the patient's vascular studies and examination, pt will return to clinic in 3 weeks for left great toe evaluation by Dr. Donnetta Hutching.   I discussed in depth with the patient the nature of atherosclerosis, and emphasized the importance of maximal medical management including strict control of blood pressure, blood glucose, and lipid levels, obtaining regular exercise, and cessation of smoking.  The patient is aware that without maximal medical management the underlying atherosclerotic disease process will progress, limiting the benefit of any interventions.  The patient was given information about PAD including signs, symptoms, treatment, what symptoms should prompt the patient to seek immediate medical care, and risk reduction measures to take.  Clemon Chambers, RN, MSN, FNP-C [facility] Phone: [phone]  Clinic MD: Early  05/14/2016 9:19 AM

## 2016-05-24 ENCOUNTER — Encounter: Payer: Self-pay | Admitting: Vascular Surgery

## 2016-06-04 ENCOUNTER — Ambulatory Visit (INDEPENDENT_AMBULATORY_CARE_PROVIDER_SITE_OTHER): Payer: Medicaid Other | Admitting: Vascular Surgery

## 2016-06-04 ENCOUNTER — Encounter: Payer: Self-pay | Admitting: Vascular Surgery

## 2016-06-04 VITALS — BP 129/74 | HR 63 | Temp 97.5°F | Ht 75.0 in | Wt 142.6 lb

## 2016-06-04 DIAGNOSIS — I779 Disorder of arteries and arterioles, unspecified: Secondary | ICD-10-CM | POA: Diagnosis not present

## 2016-06-04 NOTE — Progress Notes (Signed)
Vascular [facility]ist of [place]  Patient name: [name] [name] MRN: [id number] DOB: [birth year] Sex: male  REASON FOR VISIT: Follow-up left great toe ulceration  HPI: [name] [name] is a 65 y.o. male to me from prior left femoropopliteal bypass for limb salvage. He had been seen ago several weeks ago with the ulceration at the base of his left great toe. He did have prior extensive necrosis over the entire dorsum of his foot which eventually healed.  History reviewed. No pertinent past medical history.  Family History  Problem Relation Age of Onset  . Hypertension Mother   . Heart attack Mother   . Diabetes Mother   . Heart disease Mother   . Aneurysm Father   . Heart disease Father   . Heart attack Sister   . Heart disease Sister   . Heart disease Brother     heart transplant    SOCIAL HISTORY: Social History  Substance Use Topics  . Smoking status: Current Every Day Smoker -- 0.50 packs/day for 46 years    Types: Cigarettes  . Smokeless tobacco: Never Used  . Alcohol Use: 0.0 oz/week    0 Standard drinks or equivalent per week     Comment: a12/17/20214 "last drink was 3-4 yr ago; never had problem w/it"    Allergies  Allergen Reactions  . Zetia [Ezetimibe] Anaphylaxis and Swelling    Tongue and throat   . Atorvastatin Other (See Comments)    Malaise & muscle weakness  . Penicillins Other (See Comments)    Unknown.  Tolerates cefepime  . Pravastatin Sodium     Pravastatin 40 mg qday and 40 mg q M/W/F caused muscle aches  . Lisinopril Rash  . Promethazine Rash    Current Outpatient Prescriptions  Medication Sig Dispense Refill  . acetaminophen (TYLENOL) 325 MG tablet Take 2 tablets (650 mg total) by mouth every 6 (six) hours as needed for fever. 30 tablet 0  . albuterol (PROVENTIL HFA;VENTOLIN HFA) 108 (90 BASE) MCG/ACT inhaler Inhale 2 puffs into the lungs every 6 (six) hours as needed for wheezing or shortness  of breath. 1 Inhaler 2  . alum & mag hydroxide-simeth (MAALOX/MYLANTA) 200-200-20 MG/5ML suspension Take 30 mLs by mouth every 6 (six) hours as needed for indigestion or heartburn (or bloating). 355 mL 0  . antiseptic oral rinse (CPC / CETYLPYRIDINIUM CHLORIDE 0.05%) 0.05 % LIQD solution 7 mLs by Mouth Rinse route 2 times daily at 12 noon and 4 pm. 30 mL 0  . bisacodyl (DULCOLAX) 10 MG suppository Place 1 suppository (10 mg total) rectally every 12 (twelve) hours as needed for mild constipation or moderate constipation. 12 suppository 0  . collagenase (SANTYL) ointment Apply thin layer to wound bed of left foot daily 30 g 1  . dicyclomine (BENTYL) 20 MG tablet Take 1 tablet (20 mg total) by mouth 2 (two) times daily. 20 tablet 0  . diphenhydrAMINE (BENADRYL) 25 mg capsule Take 1 capsule (25 mg total) by mouth every 6 (six) hours as needed for itching, allergies or sleep (anxiety). 30 capsule 0  . feeding supplement, ENSURE ENLIVE, (ENSURE ENLIVE) LIQD Take 237 mLs by mouth 3 (three) times daily between meals. 237 mL 12  . gabapentin (NEURONTIN) 300 MG capsule Take 300 mg by mouth 3 (three) times daily.    . insulin aspart (NOVOLOG) 100 UNIT/ML injection Inject 0-15 Units into the skin 3 (three) times daily with meals. 10 mL 11  . magnesium oxide (MAG-OX)  400 (241.3 Mg) MG tablet Take 1 tablet (400 mg total) by mouth 2 (two) times daily. 60 tablet 0  . methocarbamol (ROBAXIN) 500 MG tablet Take 2 tablets (1,000 mg total) by mouth 3 (three) times daily. 90 tablet 0  . mirtazapine (REMERON) 15 MG tablet Take 15 mg by mouth at bedtime. Reported on 05/08/2016    . ondansetron (ZOFRAN) 4 MG tablet Take 1 tablet (4 mg total) by mouth every 6 (six) hours. 12 tablet 0  . Sennosides (SENNA-LAX PO) Take 2 tablets by mouth daily.    Marland Kitchen thiamine 100 MG tablet Take 1 tablet (100 mg total) by mouth daily. 30 tablet 0  . traMADol (ULTRAM) 50 MG tablet Take 1-2 tablets by mouth every 6 hours as needed for pain 40  tablet 0  . triamcinolone cream (KENALOG) 0.1 % Apply 1 application topically 2 (two) times daily as needed (for eczema).    . vitamin C (ASCORBIC ACID) 500 MG tablet Take 500 mg by mouth 2 (two) times daily.    Marland Kitchen zinc sulfate 220 MG capsule Take 220 mg by mouth daily. X 45 days until 02/23/16    . cephALEXin (KEFLEX) 500 MG capsule Take 1 capsule (500 mg total) by mouth 3 (three) times daily. (Patient not taking: Reported on 06/04/2016) 42 capsule 0   No current facility-administered medications for this visit.    REVIEW OF SYSTEMS:  [X]  denotes positive finding, [ ]  denotes negative finding Cardiac  Comments:  Chest pain or chest pressure:    Shortness of breath upon exertion:    Short of breath when lying flat:    Irregular heart rhythm:        Vascular    Pain in calf, thigh, or hip brought on by ambulation:    Pain in feet at night that wakes you up from your sleep:     Blood clot in your veins:    Leg swelling:         Pulmonary    Oxygen at home:    Productive cough:     Wheezing:         Neurologic    Sudden weakness in arms or legs:     Sudden numbness in arms or legs:     Sudden onset of difficulty speaking or slurred speech:    Temporary loss of vision in one eye:     Problems with dizziness:         Gastrointestinal    Blood in stool:     Vomited blood:         Genitourinary    Burning when urinating:     Blood in urine:        Psychiatric    Major depression:         Hematologic    Bleeding problems:                                  PHYSICAL EXAM: Filed Vitals:   06/04/16 1050 06/04/16 1051  BP: 149/80 129/74  Pulse: 63   Temp: 97.5 F (36.4 C)   TempSrc: Oral   Height: 6\' 3"  (1.905 m)   Weight: 142 lb 9.6 oz (64.683 kg)   SpO2: 97%     GENERAL: The patient is a well-nourished male, in no acute distress. The vital signs are documented above. Vascular: Does have palpable left popliteal pulse. PULMONARY: There is good air exchange  MUSCULOSKELETAL: There are no major deformities or cyanosis. NEUROLOGIC: No focal weakness or paresthesias are detected. SKIN: Superficial ulceration at the base of his great toenail bed. No drainage or erythema PSYCHIATRIC: The patient has a normal affect.    MEDICAL ISSUES:  Stable wound at the base of his left great toe. We'll see him again in one month for continued follow-up. Feel that he does have adequate flow to heal this. He will notify should he develop any fevers or erythema.   Rosetta Posner, MD FACS [facility] Tel [phone] Pager [phone]

## 2016-07-04 ENCOUNTER — Encounter: Payer: Self-pay | Admitting: Vascular Surgery

## 2016-07-09 ENCOUNTER — Encounter: Payer: Self-pay | Admitting: Vascular Surgery

## 2016-07-09 ENCOUNTER — Ambulatory Visit (INDEPENDENT_AMBULATORY_CARE_PROVIDER_SITE_OTHER): Payer: Medicaid Other | Admitting: Vascular Surgery

## 2016-07-09 VITALS — BP 133/81 | HR 67 | Ht 75.0 in | Wt 141.5 lb

## 2016-07-09 DIAGNOSIS — I739 Peripheral vascular disease, unspecified: Secondary | ICD-10-CM

## 2016-07-09 NOTE — Progress Notes (Signed)
[facility] [place]  Patient name: [name] [name] MRN: [id number] DOB: 20-Jun-1951 Sex: male  REASON FOR VISIT: Follow-up left great toe wound  HPI: [name] [name] is a 65 y.o. male who presents for one month follow-up of a wound at the base of left great toe. He previously had an extensive wound to the dorsum of his left foot that has now healed. He previously underwent left femoral to popliteal bypass. The patient is currently going to a pain clinic.    Family History  Problem Relation Age of Onset  . Hypertension Mother   . Heart attack Mother   . Diabetes Mother   . Heart disease Mother   . Aneurysm Father   . Heart disease Father   . Heart attack Sister   . Heart disease Sister   . Heart disease Brother     heart transplant    SOCIAL HISTORY: Social History  Substance Use Topics  . Smoking status: Current Every Day Smoker    Packs/day: 0.50    Years: 46.00    Types: Cigarettes  . Smokeless tobacco: Never Used  . Alcohol use 0.0 oz/week     Comment: a12/17/20214 "last drink was 3-4 yr ago; never had problem w/it"    Allergies  Allergen Reactions  . Zetia [Ezetimibe] Anaphylaxis and Swelling    Tongue and throat   . Atorvastatin Other (See Comments)    Malaise & muscle weakness  . Penicillins Other (See Comments)    Unknown.  Tolerates cefepime  . Pravastatin Sodium     Pravastatin 40 mg qday and 40 mg q M/W/F caused muscle aches  . Lisinopril Rash  . Promethazine Rash    Current Outpatient Prescriptions  Medication Sig Dispense Refill  . acetaminophen (TYLENOL) 325 MG tablet Take 2 tablets (650 mg total) by mouth every 6 (six) hours as needed for fever. 30 tablet 0  . albuterol (PROVENTIL HFA;VENTOLIN HFA) 108 (90 BASE) MCG/ACT inhaler Inhale 2 puffs into the lungs every 6 (six) hours as needed for wheezing or shortness of breath. 1 Inhaler 2  . alum & mag hydroxide-simeth (MAALOX/MYLANTA) 200-200-20 MG/5ML suspension Take 30 mLs  by mouth every 6 (six) hours as needed for indigestion or heartburn (or bloating). 355 mL 0  . antiseptic oral rinse (CPC / CETYLPYRIDINIUM CHLORIDE 0.05%) 0.05 % LIQD solution 7 mLs by Mouth Rinse route 2 times daily at 12 noon and 4 pm. 30 mL 0  . bisacodyl (DULCOLAX) 10 MG suppository Place 1 suppository (10 mg total) rectally every 12 (twelve) hours as needed for mild constipation or moderate constipation. 12 suppository 0  . collagenase (SANTYL) ointment Apply thin layer to wound bed of left foot daily 30 g 1  . dicyclomine (BENTYL) 20 MG tablet Take 1 tablet (20 mg total) by mouth 2 (two) times daily. 20 tablet 0  . diphenhydrAMINE (BENADRYL) 25 mg capsule Take 1 capsule (25 mg total) by mouth every 6 (six) hours as needed for itching, allergies or sleep (anxiety). 30 capsule 0  . feeding supplement, ENSURE ENLIVE, (ENSURE ENLIVE) LIQD Take 237 mLs by mouth 3 (three) times daily between meals. 237 mL 12  . gabapentin (NEURONTIN) 300 MG capsule Take 300 mg by mouth 3 (three) times daily.    . insulin aspart (NOVOLOG) 100 UNIT/ML injection Inject 0-15 Units into the skin 3 (three) times daily with meals. 10 mL 11  . magnesium oxide (MAG-OX) 400 (241.3 Mg) MG tablet Take 1 tablet (  400 mg total) by mouth 2 (two) times daily. 60 tablet 0  . methocarbamol (ROBAXIN) 500 MG tablet Take 2 tablets (1,000 mg total) by mouth 3 (three) times daily. 90 tablet 0  . mirtazapine (REMERON) 15 MG tablet Take 15 mg by mouth at bedtime. Reported on 05/08/2016    . ondansetron (ZOFRAN) 4 MG tablet Take 1 tablet (4 mg total) by mouth every 6 (six) hours. 12 tablet 0  . Sennosides (SENNA-LAX PO) Take 2 tablets by mouth daily.    Marland Kitchen thiamine 100 MG tablet Take 1 tablet (100 mg total) by mouth daily. 30 tablet 0  . traMADol (ULTRAM) 50 MG tablet Take 1-2 tablets by mouth every 6 hours as needed for pain 40 tablet 0  . triamcinolone cream (KENALOG) 0.1 % Apply 1 application topically 2 (two) times daily as needed (for  eczema).    . vitamin C (ASCORBIC ACID) 500 MG tablet Take 500 mg by mouth 2 (two) times daily.    Marland Kitchen zinc sulfate 220 MG capsule Take 220 mg by mouth daily. X 45 days until 02/23/16    . cephALEXin (KEFLEX) 500 MG capsule Take 1 capsule (500 mg total) by mouth 3 (three) times daily. (Patient not taking: Reported on 06/04/2016) 42 capsule 0   No current facility-administered medications for this visit.     REVIEW OF SYSTEMS:  [X]  denotes positive finding, [ ]  denotes negative finding Cardiac  Comments:  Chest pain or chest pressure:    Shortness of breath upon exertion:    Short of breath when lying flat:    Irregular heart rhythm:        Vascular    Pain in calf, thigh, or hip brought on by ambulation:    Pain in feet at night that wakes you up from your sleep:  x   Blood clot in your veins:    Leg swelling:         Pulmonary    Oxygen at home:    Productive cough:     Wheezing:         Neurologic    Sudden weakness in arms or legs:     Sudden numbness in arms or legs:     Sudden onset of difficulty speaking or slurred speech:    Temporary loss of vision in one eye:  x   Problems with dizziness:         Gastrointestinal    Blood in stool:     Vomited blood:         Genitourinary    Burning when urinating:     Blood in urine:        Psychiatric    Major depression:         Hematologic    Bleeding problems:    Problems with blood clotting too easily:        Skin    Rashes or ulcers:        Constitutional    Fever or chills: x     PHYSICAL EXAM: Vitals:   07/09/16 1334  BP: 133/81  Pulse: 67  SpO2: 97%  Weight: 141 lb 8 oz (64.2 kg)  Height: 6\' 3"  (1.905 m)    GENERAL: The patient is a well-nourished male, in no acute distress. The vital signs are documented above. VASCULAR: Palpable left popliteal pulse. PULMONARY: Nonlabored respiratory effort. MUSCULOSKELETAL: No abnormalities. NEUROLOGIC: No focal deficits. SKIN: 4-5 mm superficial ulceration at  the base of left great toenail. No  drainage. No erythema. PSYCHIATRIC: The patient has a normal affect.   MEDICAL ISSUES: Superficial wound at base of left great toenail. This has continued to improve since one month ago. This should continue to heal. He will follow-up in 6 months with left lower extremity graft duplex and ABIs.    Virgina Jock, PA-C [facility] [place]     I have examined the patient, reviewed and agree with above.  Curt Jews, MD 07/09/2016 1:59 PM

## 2016-07-23 ENCOUNTER — Other Ambulatory Visit: Payer: Self-pay | Admitting: *Deleted

## 2016-07-23 DIAGNOSIS — I739 Peripheral vascular disease, unspecified: Secondary | ICD-10-CM

## 2016-08-16 ENCOUNTER — Other Ambulatory Visit: Payer: Self-pay | Admitting: Adult Health

## 2016-10-01 ENCOUNTER — Other Ambulatory Visit: Payer: Self-pay | Admitting: Adult Health

## 2016-10-22 ENCOUNTER — Other Ambulatory Visit: Payer: Self-pay | Admitting: Adult Health

## 2016-11-06 ENCOUNTER — Encounter (HOSPITAL_COMMUNITY): Payer: Self-pay | Admitting: Emergency Medicine

## 2016-11-06 ENCOUNTER — Ambulatory Visit (HOSPITAL_COMMUNITY)
Admission: EM | Admit: 2016-11-06 | Discharge: 2016-11-06 | Disposition: A | Discharge status: Home / Self Care | Payer: Medicaid Other | Attending: Family Medicine | Admitting: Family Medicine

## 2016-11-06 DIAGNOSIS — G510 Bell's palsy: Secondary | ICD-10-CM | POA: Diagnosis not present

## 2016-11-06 HISTORY — DX: Type 2 diabetes mellitus without complications: E11.9

## 2016-11-06 MED ORDER — METHYLPREDNISOLONE ACETATE 80 MG/ML IJ SUSP
80.0000 mg | Freq: Once | INTRAMUSCULAR | Status: AC
  Administered 2016-11-06: 80 mg via INTRAMUSCULAR

## 2016-11-06 MED ORDER — TRIAMCINOLONE ACETONIDE 40 MG/ML IJ SUSP
40.0000 mg | Freq: Once | INTRAMUSCULAR | Status: AC
  Administered 2016-11-06: 40 mg via INTRAMUSCULAR

## 2016-11-06 MED ORDER — TRIAMCINOLONE ACETONIDE 40 MG/ML IJ SUSP
INTRAMUSCULAR | Status: AC
  Filled 2016-11-06: qty 1

## 2016-11-06 MED ORDER — METHYLPREDNISOLONE 4 MG PO TBPK: ORAL_TABLET | ORAL | 0 refills | Status: DC

## 2016-11-06 MED ORDER — METHYLPREDNISOLONE ACETATE 80 MG/ML IJ SUSP
INTRAMUSCULAR | Status: AC
  Filled 2016-11-06: qty 1

## 2016-11-06 NOTE — ED Triage Notes (Signed)
Pt began drooling at night about 10 days ago.  Three days ago pt reports difficulty chewing, burning in his left eye, and left facial numbness and drooping.  Pt has no other neurological effects.

## 2016-11-06 NOTE — ED Provider Notes (Signed)
[facility]    CSN: [id number] Arrival date & time: 11/06/16  1258     History   Chief Complaint Chief Complaint  Patient presents with  . facial numbness    HPI [name] [name] is a 65 y.o. male.   The history is provided by the patient.  Facial Injury  Location:  L cheek, mouth and forehead Pain details:    Quality:  Dull, sharp, numbness and stiffness   Severity:  Moderate   Duration:  3 days   Progression:  Unchanged Relieved by:  None tried Ineffective treatments:  None tried Associated symptoms: no headaches, no loss of consciousness, no nausea and no neck pain     Past Medical History:  Diagnosis Date  . Diabetes mellitus without complication ([place])   . Hypertension     Patient Active Problem List   Diagnosis Date Noted  . Mood disorder ([place]) 01/28/2016  . Depression 01/28/2016  . Acute blood loss anemia 01/06/2016  . Polyneuropathy ([place]) 01/06/2016  . Hypomagnesemia 12/16/2015  . Demand ischemia on 1/20 from acute blood loss anemia ([place]) 12/16/2015  . Abdominal wall fluid collections   . Acute encephalopathy 12/12/2015  . Seizures ([place]) 12/12/2015  . Acute kidney injury ([place]) 12/12/2015  . Controlled diabetes mellitus with circulatory complication, without long-term current use of insulin ([place]) 12/12/2015  . Anemia of chronic disease 12/12/2015  . Sepsis ([place]) 12/12/2015  . Leukocytosis 12/12/2015  . Thrombocytopenia ([place]) 12/12/2015  . Palliative care encounter   . Acute respiratory failure with hypoxemia ([place]) 12/06/2015  . DVT of axillary vein, acute right ([place])   . Small bowel obstruction & perforation s/p OR exploration & repair 12/01/2015 12/02/2015  . Purulent peritonitis ([place]) 12/02/2015  . Protein-calorie malnutrition, severe ([place]) 07/30/2015    Past Surgical History:  Procedure Laterality Date  . ABDOMINAL AORTAGRAM N/A 11/11/2011   Procedure: ABDOMINAL Maxcine Ham;  Surgeon: Angelia Mould, MD;  Location: [facility] CATH LAB;   Service: Cardiovascular;  Laterality: N/A;  . AORTA - BILATERAL FEMORAL ARTERY BYPASS GRAFT  11/25/2011   Procedure: AORTA BIFEMORAL BYPASS GRAFT;  Surgeon: Rosetta Posner, MD;  Location: [place];  Service: Vascular;  Laterality: N/A;  . COLONOSCOPY    . FEMORAL-POPLITEAL BYPASS GRAFT Left 07/28/2015   Procedure: LEFT FEMORAL-POPLITEAL ARTERY BYPASS GRAFT;  Surgeon: Rosetta Posner, MD;  Location: [facility];  Service: Vascular;  Laterality: Left;  . ILIAC ARTERY STENT  05/06/2011  . INGUINAL HERNIA REPAIR Right   . LAPAROTOMY N/A 12/01/2015   Procedure: EXPLORATORY LAPAROTOMY WITH RADICAL PERITONEAL DEBRIDEMENT, CLOSURE OF SMALL BOWEL PERFORATION AND LYSIS OF ADHESIONS.;  Surgeon: Johnathan Hausen, MD;  Location: [facility] ORS;  Service: General;  Laterality: N/A;  . LEFT HEART CATHETERIZATION WITH CORONARY ANGIOGRAM N/A 06/07/2013   Procedure: LEFT HEART CATHETERIZATION WITH CORONARY ANGIOGRAM;  Surgeon: Sherren Mocha, MD;  Location[facility] C[facility] LAB;  Service: Cardiovascular;  Laterality: N/A;  . LEFT HEART CATHETERIZATION WITH CORONARY ANGIOGRAM N/A 12/10/2013   Procedure: LEFT HEART CATHETERIZATION WITH CORONARY ANGIOGRAM;  Surgeon: Blane Ohara, MD;  Location: [facility] CATH LAB;  Service: Cardiovascular;  Laterality: N/A;  . LOWER EXTREMITY ANGIOGRAM Bilateral 11/11/2011   Procedure: LOWER EXTREMITY ANGIOGRAM;  Surgeon: Angelia Mould, MD;  Location: [facility] CATH LAB;  Service: Cardiovascular;  Laterality: Bilateral;  . PERCUTANEOUS CORONARY STENT INTERVENTION (PCI-S) Right 06/07/2013   Procedure: PERCUTANEOUS CORONARY STENT INTERVENTION (PCI-S);  Surgeon: Sherren Mocha, MD;  Location: [facility] CATH LAB;  Service: Cardiovascular;  Laterality: Right;  .  PERIPHERAL VASCULAR CATHETERIZATION N/A 07/26/2015   Procedure: Abdominal Aortogram;  Surgeon: Serafina Mitchell, MD;  Location: [facility] CV LAB;  Service: Cardiovascular;  Laterality: N/A;  . PERIPHERAL VASCULAR CATHETERIZATION Bilateral 07/26/2015   Procedure: Lower Extremity  Angiography;  Surgeon: Serafina Mitchell, MD;  Location: [place] [facility] CV LAB;  Service: Cardiovascular;  Laterality: Bilateral;  . PR VEIN BYPASS GRAFT,AORTO-FEM-POP  11/24/2012  . TONSILLECTOMY         Home Medications    Prior to Admission medications   Medication Sig Start Date End Date Taking? Authorizing Provider  amoxicillin (AMOXIL) 875 MG tablet Take 875 mg by mouth 2 (two) times daily.   Yes Historical Provider, MD  collagenase (SANTYL) ointment Apply thin layer to wound bed of left foot daily 10/26/15  Yes Rosetta Posner, MD  insulin aspart (NOVOLOG) 100 UNIT/ML injection Inject 0-15 Units into the skin 3 (three) times daily with meals. 12/29/15  Yes Robbie Lis, MD  ondansetron (ZOFRAN) 4 MG tablet Take 1 tablet (4 mg total) by mouth every 6 (six) hours. 05/08/16  Yes Comer Locket, PA-C  oxyCODONE (OXYCONTIN) 15 mg 12 hr tablet Take 15 mg by mouth every 12 (twelve) hours.   Yes Historical Provider, MD  oxyCODONE (ROXICODONE) 15 MG immediate release tablet Take 15 mg by mouth every 6 (six) hours as needed for pain.   Yes Historical Provider, MD  thiamine 100 MG tablet Take 1 tablet (100 mg total) by mouth daily. 12/29/15  Yes Robbie Lis, MD  vitamin C (ASCORBIC ACID) 500 MG tablet Take 500 mg by mouth 2 (two) times daily.   Yes Historical Provider, MD  acetaminophen (TYLENOL) 325 MG tablet Take 2 tablets (650 mg total) by mouth every 6 (six) hours as needed for fever. 12/29/15   Robbie Lis, MD  albuterol (PROVENTIL HFA;VENTOLIN HFA) 108 (90 BASE) MCG/ACT inhaler Inhale 2 puffs into the lungs every 6 (six) hours as needed for wheezing or shortness of breath. 11/11/13   Ripudeep Krystal Eaton, MD  alum & mag hydroxide-simeth (MAALOX/MYLANTA) 200-200-20 MG/5ML suspension Take 30 mLs by mouth every 6 (six) hours as needed for indigestion or heartburn (or bloating). 12/29/15   Robbie Lis, MD  antiseptic oral rinse (CPC / CETYLPYRIDINIUM CHLORIDE 0.05%) 0.05 % LIQD solution 7 mLs by Mouth Rinse  route 2 times daily at 12 noon and 4 pm. 12/29/15   Robbie Lis, MD  bisacodyl (DULCOLAX) 10 MG suppository Place 1 suppository (10 mg total) rectally every 12 (twelve) hours as needed for mild constipation or moderate constipation. 12/29/15   Robbie Lis, MD  cephALEXin (KEFLEX) 500 MG capsule Take 1 capsule (500 mg total) by mouth 3 (three) times daily. Patient not taking: Reported on 06/04/2016 05/08/16   Sharmon Leyden Nickel, NP  dicyclomine (BENTYL) 20 MG tablet Take 1 tablet (20 mg total) by mouth 2 (two) times daily. 05/08/16   Comer Locket, PA-C  diphenhydrAMINE (BENADRYL) 25 mg capsule Take 1 capsule (25 mg total) by mouth every 6 (six) hours as needed for itching, allergies or sleep (anxiety). 12/29/15   Robbie Lis, MD  feeding supplement, ENSURE ENLIVE, (ENSURE ENLIVE) LIQD Take 237 mLs by mouth 3 (three) times daily between meals. 12/29/15   Robbie Lis, MD  gabapentin (NEURONTIN) 300 MG capsule Take 300 mg by mouth 3 (three) times daily.    Historical Provider, MD  magnesium oxide (MAG-OX) 400 (241.3 Mg) MG tablet Take 1 tablet (400 mg total)  by mouth 2 (two) times daily. 12/29/15   Robbie Lis, MD  methocarbamol (ROBAXIN) 500 MG tablet Take 2 tablets (1,000 mg total) by mouth 3 (three) times daily. 12/29/15   Robbie Lis, MD  mirtazapine (REMERON) 15 MG tablet Take 15 mg by mouth at bedtime. Reported on 05/08/2016    Historical Provider, MD  Sennosides (SENNA-LAX PO) Take 2 tablets by mouth daily.    Historical Provider, MD  traMADol (ULTRAM) 50 MG tablet Take 1-2 tablets by mouth every 6 hours as needed for pain 05/08/16   Sharmon Leyden Nickel, NP  triamcinolone cream (KENALOG) 0.1 % Apply 1 application topically 2 (two) times daily as needed (for eczema).    Historical Provider, MD  zinc sulfate 220 MG capsule Take 220 mg by mouth daily. X 45 days until 02/23/16    Historical Provider, MD    Family History Family History  Problem Relation Age of Onset  . Hypertension Mother   . Heart  attack Mother   . Diabetes Mother   . Heart disease Mother   . Aneurysm Father   . Heart disease Father   . Heart attack Sister   . Heart disease Sister   . Heart disease Brother     heart transplant    Social History Social History  Substance Use Topics  . Smoking status: Current Every Day Smoker    Packs/day: 0.50    Years: 46.00    Types: Cigarettes  . Smokeless tobacco: Never Used  . Alcohol use No     Comment: a12/17/20214 "last drink was 3-4 yr ago; never had problem w/it"     Allergies   Zetia [ezetimibe]; Atorvastatin; Penicillins; Pravastatin sodium; Lisinopril; and Promethazine   Review of Systems Review of Systems  HENT: Negative.   Respiratory: Negative.   Gastrointestinal: Negative for nausea.  Musculoskeletal: Negative for neck pain.  Neurological: Positive for facial asymmetry, weakness and numbness. Negative for dizziness, loss of consciousness and headaches.  Hematological: Negative.      Physical Exam Triage Vital Signs ED Triage Vitals [11/06/16 1326]  Enc Vitals Group     BP 156/91     Pulse Rate 60     Resp      Temp 98.1 F (36.7 C)     Temp src      SpO2 98 %     Weight      Height      Head Circumference      Peak Flow      Pain Score 5     Pain Loc      Pain Edu?      Excl. in [place]?    No data found.   Updated Vital Signs BP 156/91 (BP Location: Right Arm)   Pulse 60   Temp 98.1 F (36.7 C)   SpO2 98%   Visual Acuity Right Eye Distance:   Left Eye Distance:   Bilateral Distance:    Right Eye Near:   Left Eye Near:    Bilateral Near:     Physical Exam  Constitutional: He is oriented to person, place, and time. No distress.  HENT:  Head: Normocephalic.  Right Ear: External ear normal.  Left Ear: External ear normal.  Nose: Nose normal.  Mouth/Throat: Oropharynx is clear and moist.  Eyes: Conjunctivae are normal. Pupils are equal, round, and reactive to light.  Unable to close left eye.  Neck: Normal range of  motion. Neck supple.  Lymphadenopathy:  He has no cervical adenopathy.  Neurological: He is alert and oriented to person, place, and time. A cranial nerve deficit is present.  Facial n weakness left side.   Skin: Skin is warm and dry.  Nursing note and vitals reviewed.    UC Treatments / Results  Labs (all labs ordered are listed, but only abnormal results are displayed) Labs Reviewed - No data to display  EKG  EKG Interpretation None       Radiology No results found.  Procedures Procedures (including critical care time)  Medications Ordered in UC Medications - No data to display   Initial Impression / Assessment and Plan / UC Course  I have reviewed the triage vital signs and the nursing notes.  Pertinent labs & imaging results that were available during my care of the patient were reviewed by me and considered in my medical decision making (see chart for details).  Clinical Course       Final Clinical Impressions(s) / UC Diagnoses   Final diagnoses:  None    New Prescriptions New Prescriptions   No medications on file     Billy Fischer, MD 11/06/16 2021

## 2017-01-06 ENCOUNTER — Encounter: Payer: Self-pay | Admitting: Vascular Surgery

## 2017-01-14 ENCOUNTER — Encounter: Payer: Self-pay | Admitting: Vascular Surgery

## 2017-01-14 ENCOUNTER — Ambulatory Visit (HOSPITAL_COMMUNITY)
Admission: RE | Admit: 2017-01-14 | Discharge: 2017-01-14 | Disposition: A | Discharge status: Home / Self Care | Payer: Medicaid Other | Source: Ambulatory Visit | Attending: Vascular Surgery | Admitting: Vascular Surgery

## 2017-01-14 ENCOUNTER — Ambulatory Visit (INDEPENDENT_AMBULATORY_CARE_PROVIDER_SITE_OTHER): Payer: Medicaid Other | Admitting: Vascular Surgery

## 2017-01-14 ENCOUNTER — Ambulatory Visit (INDEPENDENT_AMBULATORY_CARE_PROVIDER_SITE_OTHER)
Admission: RE | Admit: 2017-01-14 | Discharge: 2017-01-14 | Disposition: A | Discharge status: Home / Self Care | Payer: Medicaid Other | Source: Ambulatory Visit | Attending: Vascular Surgery | Admitting: Vascular Surgery

## 2017-01-14 VITALS — BP 150/87 | HR 59 | Temp 97.8°F | Resp 20 | Ht 75.0 in | Wt 137.0 lb

## 2017-01-14 DIAGNOSIS — Z720 Tobacco use: Secondary | ICD-10-CM | POA: Diagnosis not present

## 2017-01-14 DIAGNOSIS — I739 Peripheral vascular disease, unspecified: Secondary | ICD-10-CM | POA: Diagnosis not present

## 2017-01-14 DIAGNOSIS — E1151 Type 2 diabetes mellitus with diabetic peripheral angiopathy without gangrene: Secondary | ICD-10-CM | POA: Diagnosis not present

## 2017-01-14 DIAGNOSIS — R938 Abnormal findings on diagnostic imaging of other specified body structures: Secondary | ICD-10-CM | POA: Diagnosis not present

## 2017-01-14 DIAGNOSIS — R0989 Other specified symptoms and signs involving the circulatory and respiratory systems: Secondary | ICD-10-CM | POA: Diagnosis present

## 2017-01-14 DIAGNOSIS — E785 Hyperlipidemia, unspecified: Secondary | ICD-10-CM | POA: Insufficient documentation

## 2017-01-14 NOTE — Progress Notes (Signed)
[facility]ist of [place]  Patient name: [name] [name] MRN: [id number] DOB: [birth year] Sex: male  REASON FOR VISIT: Follow-up severe peripheral vascular occlusive disease. He looks quite good today. He's had no further difficulty regarding lower extremity tissue loss.  HPI: [name] [name] is a 66 y.o. male here for follow-up. His walking without claudication. 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  Past Medical History:  Diagnosis Date  . Diabetes mellitus without complication ([place])   . Hypertension     Family History  Problem Relation Age of Onset  . Hypertension Mother   . Heart attack Mother   . Diabetes Mother   . Heart disease Mother   . Aneurysm Father   . Heart disease Father   . Heart attack Sister   . Heart disease Sister   . Heart disease Brother     heart transplant    SOCIAL HISTORY: Social History  Substance Use Topics  . Smoking status: Current Every Day Smoker    Packs/day: 0.50    Years: 46.00    Types: Cigarettes  . Smokeless tobacco: Never Used  . Alcohol use No     Comment: a12/17/20214 "last drink was 3-4 yr ago; never had problem w/it"    Allergies  Allergen Reactions  . Zetia [Ezetimibe] Anaphylaxis and Swelling    Tongue and throat   . Atorvastatin Other (See Comments)    Malaise & muscle weakness  . Penicillins Other (See Comments)    Unknown.  Tolerates cefepime  . Pravastatin Sodium     Pravastatin 40 mg qday and 40 mg q M/W/F caused muscle aches  . Lisinopril Rash  . Promethazine Rash    Current Outpatient Prescriptions  Medication Sig Dispense Refill  . acetaminophen (TYLENOL) 325 MG tablet Take 2 tablets (650 mg total) by mouth every 6 (six) hours as needed for fever. 30 tablet 0  . albuterol (PROVENTIL HFA;VENTOLIN HFA) 108 (90 BASE) MCG/ACT inhaler Inhale 2 puffs into the lungs every  6 (six) hours as needed for wheezing or shortness of breath. 1 Inhaler 2  . alum & mag hydroxide-simeth (MAALOX/MYLANTA) 200-200-20 MG/5ML suspension Take 30 mLs by mouth every 6 (six) hours as needed for indigestion or heartburn (or bloating). 355 mL 0  . amoxicillin (AMOXIL) 875 MG tablet Take 875 mg by mouth 2 (two) times daily.    Marland Kitchen antiseptic oral rinse (CPC / CETYLPYRIDINIUM CHLORIDE 0.05%) 0.05 % LIQD solution 7 mLs by Mouth Rinse route 2 times daily at 12 noon and 4 pm. 30 mL 0  . bisacodyl (DULCOLAX) 10 MG suppository Place 1 suppository (10 mg total) rectally every 12 (twelve) hours as needed for mild constipation or moderate constipation. 12 suppository 0  . collagenase (SANTYL) ointment Apply thin layer to wound bed of left foot daily 30 g 1  . dicyclomine (BENTYL) 20 MG tablet Take 1 tablet (20 mg total) by mouth 2 (two) times daily. 20 tablet 0  . diphenhydrAMINE (BENADRYL) 25 mg capsule Take 1 capsule (25 mg total) by mouth every 6 (six) hours as needed for itching, allergies or sleep (anxiety). 30 capsule 0  . feeding supplement, ENSURE ENLIVE, (ENSURE ENLIVE) LIQD Take 237 mLs by mouth 3 (three) times daily between meals. 237 mL 12  . gabapentin (NEURONTIN) 300 MG capsule Take 300 mg by mouth 3 (three) times daily.    . insulin aspart (NOVOLOG)  100 UNIT/ML injection Inject 0-15 Units into the skin 3 (three) times daily with meals. 10 mL 11  . magnesium oxide (MAG-OX) 400 (241.3 Mg) MG tablet Take 1 tablet (400 mg total) by mouth 2 (two) times daily. 60 tablet 0  . methocarbamol (ROBAXIN) 500 MG tablet Take 2 tablets (1,000 mg total) by mouth 3 (three) times daily. 90 tablet 0  . methylPREDNISolone (MEDROL DOSEPAK) 4 MG TBPK tablet follow package directions, take all of medicine, start on thurs. 21 tablet 0  . mirtazapine (REMERON) 15 MG tablet Take 15 mg by mouth at bedtime. Reported on 05/08/2016    . ondansetron (ZOFRAN) 4 MG tablet Take 1 tablet (4 mg total) by mouth every 6 (six)  hours. 12 tablet 0  . oxyCODONE (OXYCONTIN) 15 mg 12 hr tablet Take 15 mg by mouth every 12 (twelve) hours.    Marland Kitchen oxyCODONE (ROXICODONE) 15 MG immediate release tablet Take 15 mg by mouth every 6 (six) hours as needed for pain.    . Sennosides (SENNA-LAX PO) Take 2 tablets by mouth daily.    Marland Kitchen thiamine 100 MG tablet Take 1 tablet (100 mg total) by mouth daily. 30 tablet 0  . traMADol (ULTRAM) 50 MG tablet Take 1-2 tablets by mouth every 6 hours as needed for pain 40 tablet 0  . triamcinolone cream (KENALOG) 0.1 % Apply 1 application topically 2 (two) times daily as needed (for eczema).    . vitamin C (ASCORBIC ACID) 500 MG tablet Take 500 mg by mouth 2 (two) times daily.    Marland Kitchen zinc sulfate 220 MG capsule Take 220 mg by mouth daily. X 45 days until 02/23/16    . cephALEXin (KEFLEX) 500 MG capsule Take 1 capsule (500 mg total) by mouth 3 (three) times daily. (Patient not taking: Reported on 06/04/2016) 42 capsule 0   No current facility-administered medications for this visit.     REVIEW OF SYSTEMS:  [X]  denotes positive finding, [ ]  denotes negative finding Cardiac  Comments:  Chest pain or chest pressure:    Shortness of breath upon exertion:    Short of breath when lying flat:    Irregular heart rhythm:        Vascular    Pain in calf, thigh, or hip brought on by ambulation:    Pain in feet at night that wakes you up from your sleep:     Blood clot in your veins:    Leg swelling:           PHYSICAL EXAM: Vitals:   01/14/17 1413 01/14/17 1414  BP: (!) 158/88 (!) 150/87  Pulse: (!) 59   Resp: 20   Temp: 97.8 F (36.6 C)   TempSrc: Oral   SpO2: 96%   Weight: 137 lb (62.1 kg)   Height: 6\' 3"  (1.905 m)     GENERAL: The patient is a well-nourished male, in no acute distress. The vital signs are documented above. CARDIOVASCULAR: 3+ femoral and 2+ posterior tibial pulse bilaterally PULMONARY: There is good air exchange  MUSCULOSKELETAL: There are no major deformities or  cyanosis. NEUROLOGIC: No focal weakness or paresthesias are detected. SKIN: There are no ulcers or rashes noted. Healed eschar on the dorsum of his left foot  PSYCHIATRIC: The patient has a normal affect.  DATA:  Noninvasive studies reveal a difficult to interpret flow in his left leg. Does appear that this is his graft runs just alongside his native artery. There is patency and this was similar to  what was seen the last time. He does have an ankle arm index of 0.6 bilaterally  MEDICAL ISSUES: All. We'll continue his walking program. We'll notify should he develop any new ischemic difficulties. We will see him again in one year    Rosetta Posner, MD [facility] Tel [phone] Pager [phone]

## 2017-01-17 NOTE — Addendum Note (Signed)
Addended by: Lianne Cure A on: 01/17/2017 01:26 PM   Modules accepted: Orders

## 2017-02-03 ENCOUNTER — Ambulatory Visit (INDEPENDENT_AMBULATORY_CARE_PROVIDER_SITE_OTHER): Payer: Medicaid Other | Admitting: Cardiovascular Disease

## 2017-02-03 ENCOUNTER — Encounter: Payer: Self-pay | Admitting: Cardiovascular Disease

## 2017-02-03 VITALS — BP 156/88 | HR 52 | Ht 75.0 in | Wt 137.0 lb

## 2017-02-03 DIAGNOSIS — I2510 Atherosclerotic heart disease of native coronary artery without angina pectoris: Secondary | ICD-10-CM

## 2017-02-03 NOTE — Patient Instructions (Signed)

## 2017-02-03 NOTE — Progress Notes (Signed)
Cardiology Office Note Date:  02/03/2017   ID:  [name] [name], DOB 1951-05-16, MRN [id number]  PCP:  Yong Channel, MD  Cardiologist:  Sherren Mocha, MD    Chief Complaint  Patient presents with  . Coronary Artery Disease     History of Present Illness: [name] [name] is a 66 y.o. male who presents for follow-up of CAD. He initially presented in 2014 with NSTEMI and was found to have severe stenosis of the RCA treated with PCI (Promus DES) and chronic occlusion of the LCx/OM1 branch treated medically. Other medical problems include PAD with history of aortobifemoral bypass and left fem-pop bypass (followed by Dr Donnetta Hutching), HTN, hyperlipidemia, diabetes, and seizure disorder.  Since his last visit he had a prolonged hospitalization with small bowel obstruction requiring surgery. The patient was in the hospital for 2 months. He has been on chronic narcotics ever since that time. He's lost a lot of weight. He is now about one year removed from all of this but still feels weak and doesn't have a good appetite. He denies any cardiac related problems. Specifically denies chest pain, shortness of breath, leg swelling, or heart palpitations. He's had no lightheadedness or syncope.  Past Medical History:  Diagnosis Date  . Diabetes mellitus without complication ([place])   . Hypertension     Past Surgical History:  Procedure Laterality Date  . ABDOMINAL AORTAGRAM N/A 11/11/2011   Procedure: ABDOMINAL Maxcine Ham;  Surgeon: Angelia Mould, MD;  Location: [facility] CATH LAB;  Service: Cardiovascular;  Laterality: N/A;  . AORTA - BILATERAL FEMORAL ARTERY BYPASS GRAFT  11/25/2011   Procedure: AORTA BIFEMORAL BYPASS GRAFT;  Surgeon: Rosetta Posner, MD;  Location: [place];  Service: Vascular;  Laterality: N/A;  . COLONOSCOPY    . FEMORAL-POPLITEAL BYPASS GRAFT Left 07/28/2015   Procedure: LEFT FEMORAL-POPLITEAL ARTERY BYPASS GRAFT;  Surgeon: Rosetta Posner, MD;  Location: [place];  Service: Vascular;   Laterality: Left;  . ILIAC ARTERY STENT  05/06/2011  . INGUINAL HERNIA REPAIR Right   . LAPAROTOMY N/A 12/01/2015   Procedure: EXPLORATORY LAPAROTOMY WITH RADICAL PERITONEAL DEBRIDEMENT, CLOSURE OF SMALL BOWEL PERFORATION AND LYSIS OF ADHESIONS.;  Surgeon: Johnathan Hausen, MD;  Location: [facility];  Service: General;  Laterality: N/A;  . LEFT HEART CATHETERIZATION WITH CORONARY ANGIOGRAM N/A 06/07/2013   Procedure: LEFT HEART CATHETERIZATION WITH CORONARY ANGIOGRAM;  Surgeon: Sherren Mocha, MD;  Location: [facility] CATH LAB;  Service: Cardiovascular;  Laterality: N/A;  . LEFT HEART CATHETERIZATION WITH CORONARY ANGIOGRAM N/A 12/10/2013   Procedure: LEFT HEART CATHETERIZATION WITH CORONARY ANGIOGRAM;  Surgeon: Blane Ohara, MD;  Location: [facility] CATH LAB;  Service: Cardiovascular;  Laterality: N/A;  . LOWER EXTREMITY ANGIOGRAM Bilateral 11/11/2011   Procedure: LOWER EXTREMITY ANGIOGRAM;  Surgeon: Angelia Mould, MD;  Location: [facility] CATH LAB;  Service: Cardiovascular;  Laterality: Bilateral;  . PERCUTANEOUS CORONARY STENT INTERVENTION (PCI-S) Right 06/07/2013   Procedure: PERCUTANEOUS CORONARY STENT INTERVENTION (PCI-S);  Surgeon: Sherren Mocha, MD;  Location: [facility] CATH LAB;  Service: Cardiovascular;  Laterality: Right;  . PERIPHERAL VASCULAR CATHETERIZATION N/A 07/26/2015   Procedure: Abdominal Aortogram;  Surgeon: Serafina Mitchell, MD;  Location: [place] CV LAB;  Service: Cardiovascular;  Laterality: N/A;  . PERIPHERAL VASCULAR CATHETERIZATION Bilateral 07/26/2015   Procedure: Lower Extremity Angiography;  Surgeon: Serafina Mitchell, MD;  Location: [place] CV LAB;  Service: Cardiovascular;  Laterality: Bilateral;  . PR VEIN BYPASS GRAFT,AORTO-FEM-POP  11/24/2012  . TONSILLECTOMY      Current Outpatient Prescriptions  Medication Sig Dispense Refill  . acetaminophen (TYLENOL) 325 MG tablet Take 2 tablets (650 mg total) by mouth every 6 (six) hours as needed for fever. 30 tablet 0  . albuterol (PROVENTIL  HFA;VENTOLIN HFA) 108 (90 BASE) MCG/ACT inhaler Inhale 2 puffs into the lungs every 6 (six) hours as needed for wheezing or shortness of breath. 1 Inhaler 2  . alum & mag hydroxide-simeth (MAALOX/MYLANTA) 200-200-20 MG/5ML suspension Take 30 mLs by mouth every 6 (six) hours as needed for indigestion or heartburn (or bloating). 355 mL 0  . amoxicillin (AMOXIL) 875 MG tablet Take 875 mg by mouth 2 (two) times daily.    Marland Kitchen antiseptic oral rinse (CPC / CETYLPYRIDINIUM CHLORIDE 0.05%) 0.05 % LIQD solution 7 mLs by Mouth Rinse route 2 times daily at 12 noon and 4 pm. 30 mL 0  . bisacodyl (DULCOLAX) 10 MG suppository Place 1 suppository (10 mg total) rectally every 12 (twelve) hours as needed for mild constipation or moderate constipation. 12 suppository 0  . cephALEXin (KEFLEX) 500 MG capsule Take 1 capsule (500 mg total) by mouth 3 (three) times daily. 42 capsule 0  . collagenase (SANTYL) ointment Apply thin layer to wound bed of left foot daily 30 g 1  . dicyclomine (BENTYL) 20 MG tablet Take 1 tablet (20 mg total) by mouth 2 (two) times daily. 20 tablet 0  . diphenhydrAMINE (BENADRYL) 25 mg capsule Take 1 capsule (25 mg total) by mouth every 6 (six) hours as needed for itching, allergies or sleep (anxiety). 30 capsule 0  . feeding supplement, ENSURE ENLIVE, (ENSURE ENLIVE) LIQD Take 237 mLs by mouth 3 (three) times daily between meals. 237 mL 12  . gabapentin (NEURONTIN) 300 MG capsule Take 300 mg by mouth 3 (three) times daily.    . insulin aspart (NOVOLOG) 100 UNIT/ML injection Inject 0-15 Units into the skin 3 (three) times daily with meals. 10 mL 11  . magnesium oxide (MAG-OX) 400 (241.3 Mg) MG tablet Take 1 tablet (400 mg total) by mouth 2 (two) times daily. 60 tablet 0  . methocarbamol (ROBAXIN) 500 MG tablet Take 2 tablets (1,000 mg total) by mouth 3 (three) times daily. 90 tablet 0  . methylPREDNISolone (MEDROL DOSEPAK) 4 MG TBPK tablet follow package directions, take all of medicine, start on  thurs. 21 tablet 0  . mirtazapine (REMERON) 15 MG tablet Take 15 mg by mouth at bedtime. Reported on 05/08/2016    . ondansetron (ZOFRAN) 4 MG tablet Take 1 tablet (4 mg total) by mouth every 6 (six) hours. 12 tablet 0  . oxyCODONE (OXYCONTIN) 15 mg 12 hr tablet Take 15 mg by mouth every 12 (twelve) hours.    Marland Kitchen oxyCODONE (ROXICODONE) 15 MG immediate release tablet Take 15 mg by mouth every 6 (six) hours as needed for pain.    . Sennosides (SENNA-LAX PO) Take 2 tablets by mouth daily.    Marland Kitchen thiamine 100 MG tablet Take 1 tablet (100 mg total) by mouth daily. 30 tablet 0  . traMADol (ULTRAM) 50 MG tablet Take 1-2 tablets by mouth every 6 hours as needed for pain 40 tablet 0  . triamcinolone cream (KENALOG) 0.1 % Apply 1 application topically 2 (two) times daily as needed (for eczema).    . vitamin C (ASCORBIC ACID) 500 MG tablet Take 500 mg by mouth 2 (two) times daily.    Marland Kitchen zinc sulfate 220 MG capsule Take 220 mg by mouth daily. X 45 days until 02/23/16     No  current facility-administered medications for this visit.     Allergies:   Zetia [ezetimibe]; Penicillins; Pravastatin sodium; Atorvastatin; Crestor [rosuvastatin]; Lisinopril; and Promethazine   Social History:  The patient  reports that he has been smoking Cigarettes.  He has a 23.00 pack-year smoking history. He has never used smokeless tobacco. He reports that he does not drink alcohol or use drugs.   Family History:  The patient's  family history includes Aneurysm in his father; Diabetes in his mother; Heart attack in his mother and sister; Heart disease in his brother, father, mother, and sister; Hypertension in his mother.    ROS:  Please see the history of present illness. All other systems are reviewed and negative.    PHYSICAL EXAM: VS:  BP (!) 156/88   Pulse (!) 52   Ht 6\' 3"  (1.905 m)   Wt 137 lb (62.1 kg)   BMI 17.12 kg/m  , BMI Body mass index is 17.12 kg/m. GEN: Thin male, in no acute distress  HEENT: normal  Neck:  no JVD, no masses. No carotid bruits Cardiac: RRR without murmur or gallop                Respiratory:  clear to auscultation bilaterally, normal work of breathing GI: soft, nontender, nondistended, + BS MS: no deformity or atrophy  Ext: no pretibial edema Skin: warm and dry, no rash Neuro:  Strength and sensation are intact Psych: euthymic mood, full affect  EKG:  EKG is ordered today. The ekg ordered today shows Sinus brady 52 bpm, nonspecific ST abnormality  Recent Labs: 02/13/2016: ALT 35; BUN 19; Creatinine, Ser 0.69; Hemoglobin 12.0; Platelets 156; Potassium 4.1; Sodium 138   Lipid Panel     Component Value Date/Time   CHOL 154 11/24/2015 0445   TRIG 115 12/25/2015 0450   HDL 36 (L) 11/24/2015 0445   CHOLHDL 4.3 11/24/2015 0445   VLDL 22 11/24/2015 0445   LDLCALC 96 11/24/2015 0445      Wt Readings from Last 3 Encounters:  02/03/17 137 lb (62.1 kg)  01/14/17 137 lb (62.1 kg)  07/09/16 141 lb 8 oz (64.2 kg)     ASSESSMENT AND PLAN: 1.  CAD, native vessel: No symptoms of angina.  2. Hypertension, uncontrolled: Discussed initiation of antihypertensive therapy. The patient declines. He understands the risks related to untreated hypertension. I reviewed his vital signs over multiple office visits with different providers and he is consistently hypertensive.  3. Severe protein calorie malnutrition  4. Peripheral arterial disease: Followed by Dr. Donnetta Hutching.  Overall [name] [name] is stable from a cardiac perspective. He is not really interested in preventative Rx for treatment of CAD/HTN/Hyperlipidemia. His BMI is down to 17.  Current medicines are reviewed with the patient today.  The patient does not have concerns regarding medicines.  Labs/ tests ordered today include:  No orders of the defined types were placed in this encounter.   Disposition:   FU one year  Signed, Sherren Mocha, MD  02/03/2017 9:44 AM    [facility] [place],  [place], [place]  [address] Phone: [phone]; Fax: [phone]

## 2017-03-07 ENCOUNTER — Encounter: Payer: Self-pay | Admitting: Cardiology

## 2017-09-07 IMAGING — CR DG ABDOMEN ACUTE W/ 1V CHEST
3 series · 3 of 3 positions shown · non-contrast
Comparison: CT abdomen and pelvis 10/18/2014. Chest x-rays
01/17/2015 and earlier.

CLINICAL DATA: 64-year-old presenting with severe acute
superimposed upon chronic intermittent generalized abdominal pain,
associated with nausea and vomiting.

EXAM:
DG ABDOMEN ACUTE W/ 1V CHEST

[w abdomen decub]
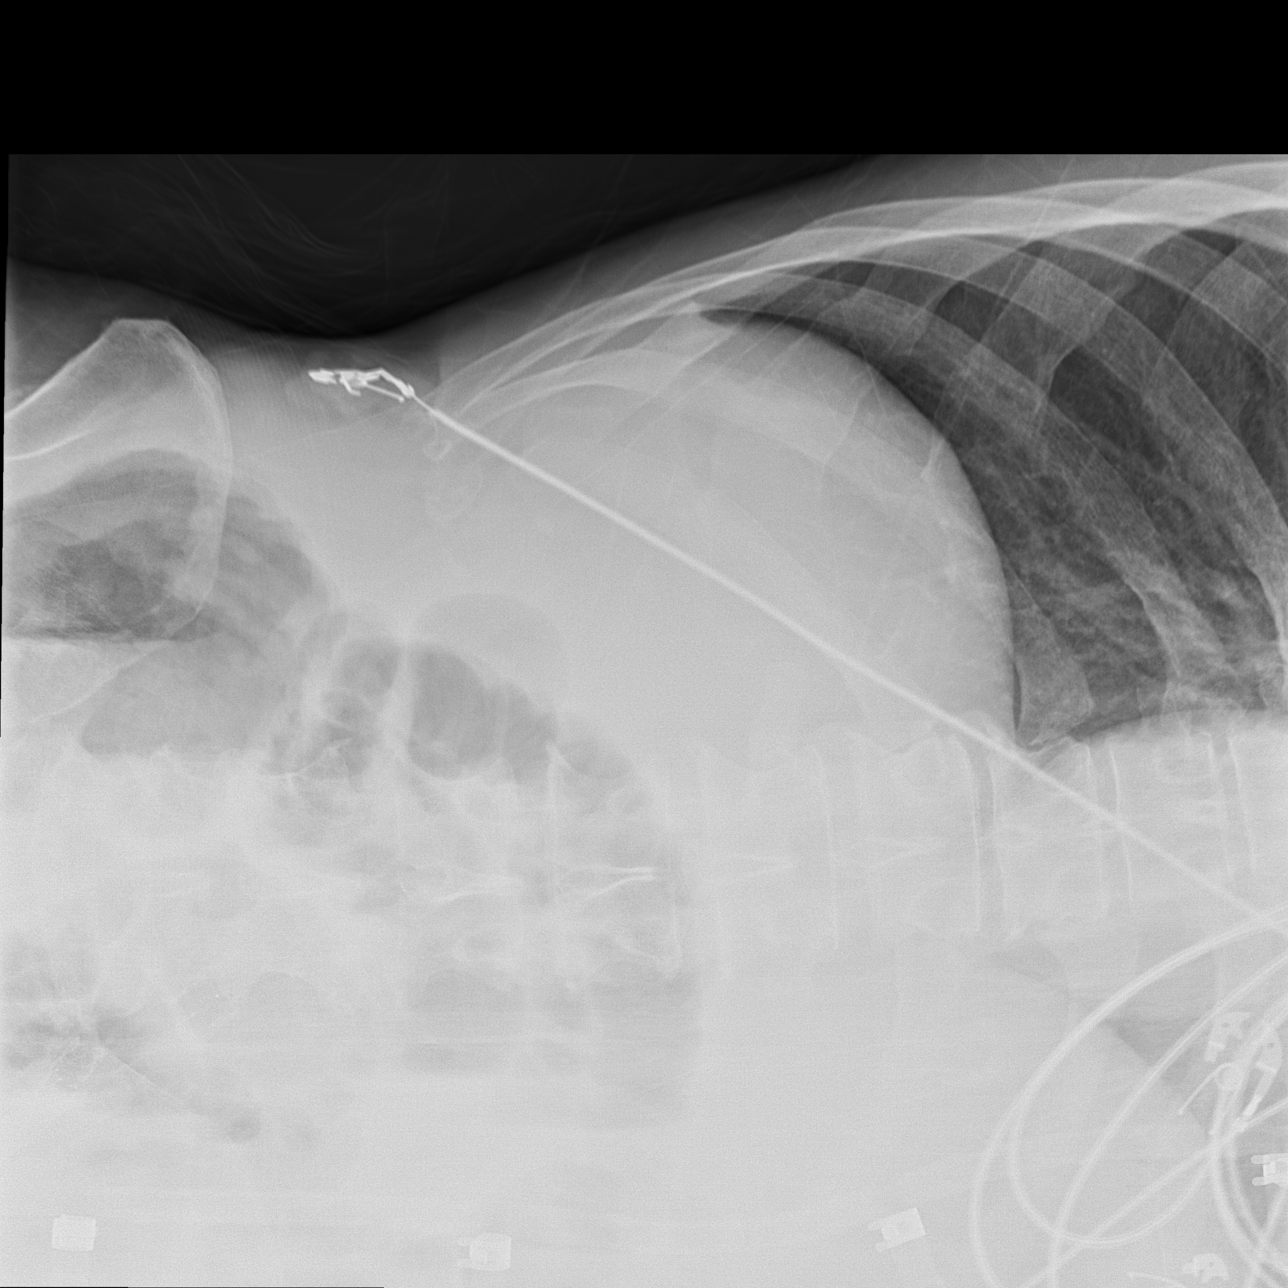

[x abdomen supine]
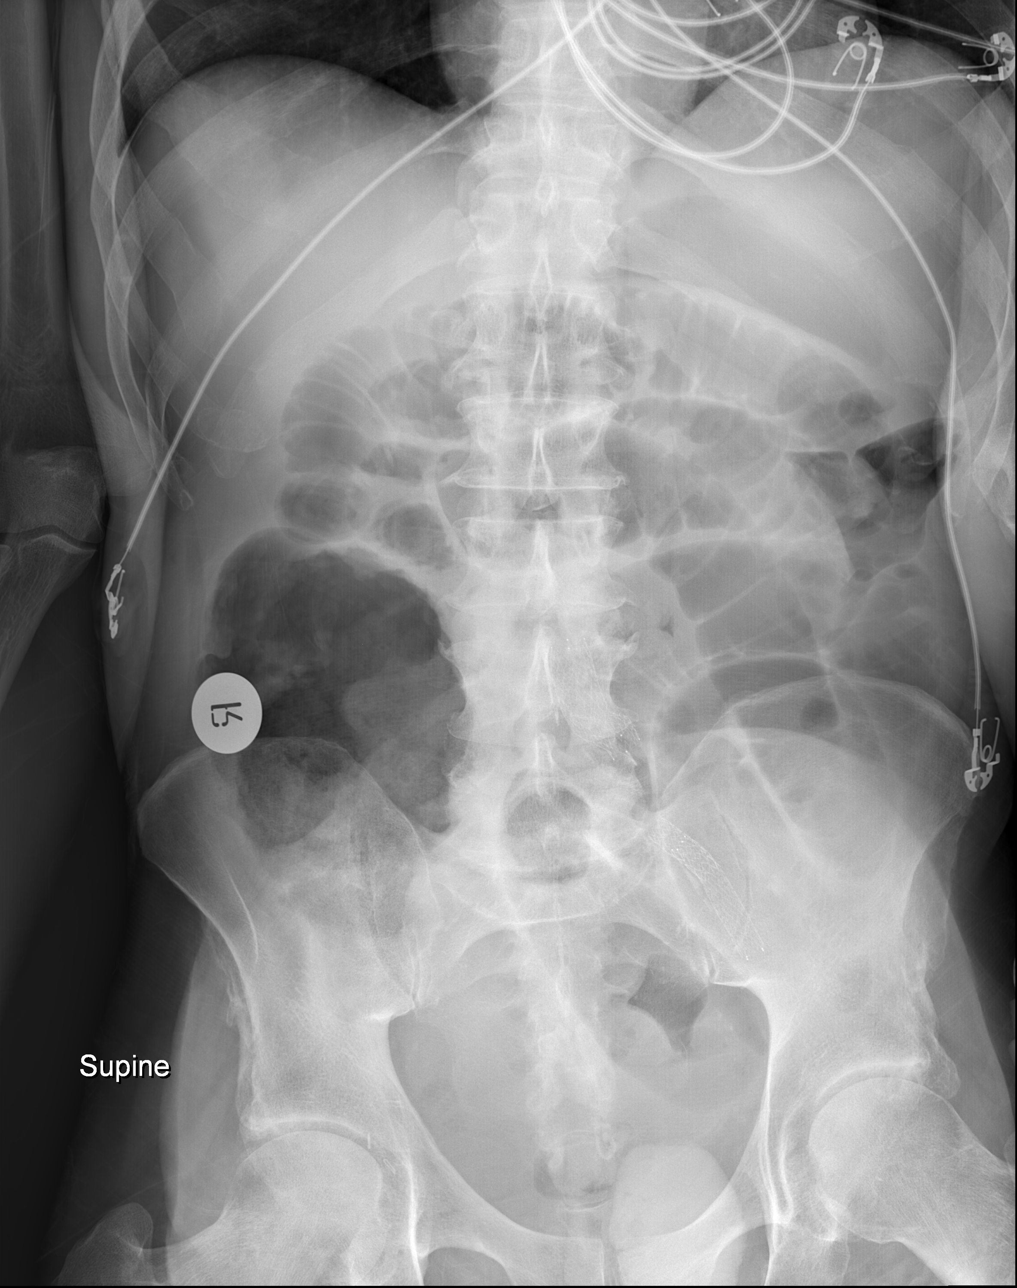

[x chest ap]
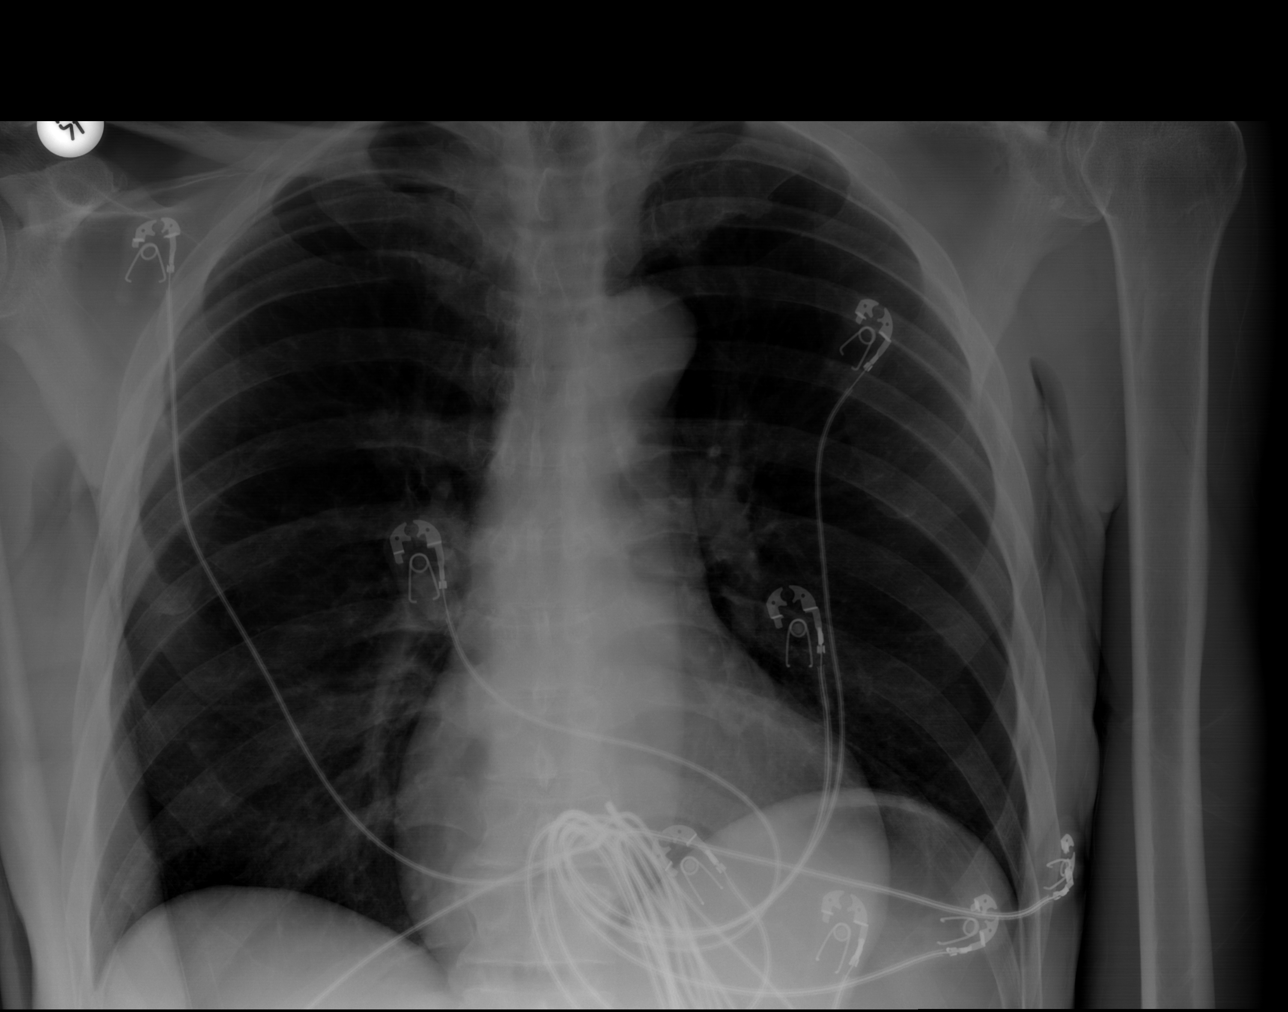

[3 of 3 positions shown; findings below may reference images not displayed]

FINDINGS: Multiple dilated loops of small bowel throughout the abdomen and
pelvis demonstrating air-fluid levels on the erect image. Air-fluid
level in the mildly distended and relatively featureless cecum and
proximal ascending colon. Gas throughout otherwise normal caliber
colon to the rectum. No evidence of free air on the lateral
decubitus image. Native abdominal aortic and left common iliac and
external iliac artery stents, though the patient has undergone a
prior aortobifemoral bypass graft procedure as noted on the prior
CT.

Cardiac silhouette mildly enlarged, unchanged allowing for
differences in technique. Lungs emphysematous though clear. No
pleural effusions.
IMPRESSION: 1. Small bowel obstruction.  No free intraperitoneal air.
2. Possible ascending colitis.
3. Mild cardiomegaly. COPD/emphysema. No acute cardiopulmonary
disease.

## 2017-09-07 IMAGING — DX DG ABDOMEN 1V
1 series · 1 of 1 positions shown · non-contrast
Comparison: CT of the abdomen and pelvis performed earlier today at
[DATE] p.m.

CLINICAL DATA: Status post nasogastric tube placement. Initial
encounter.

EXAM:
ABDOMEN - 1 VIEW

[abdomen kub]
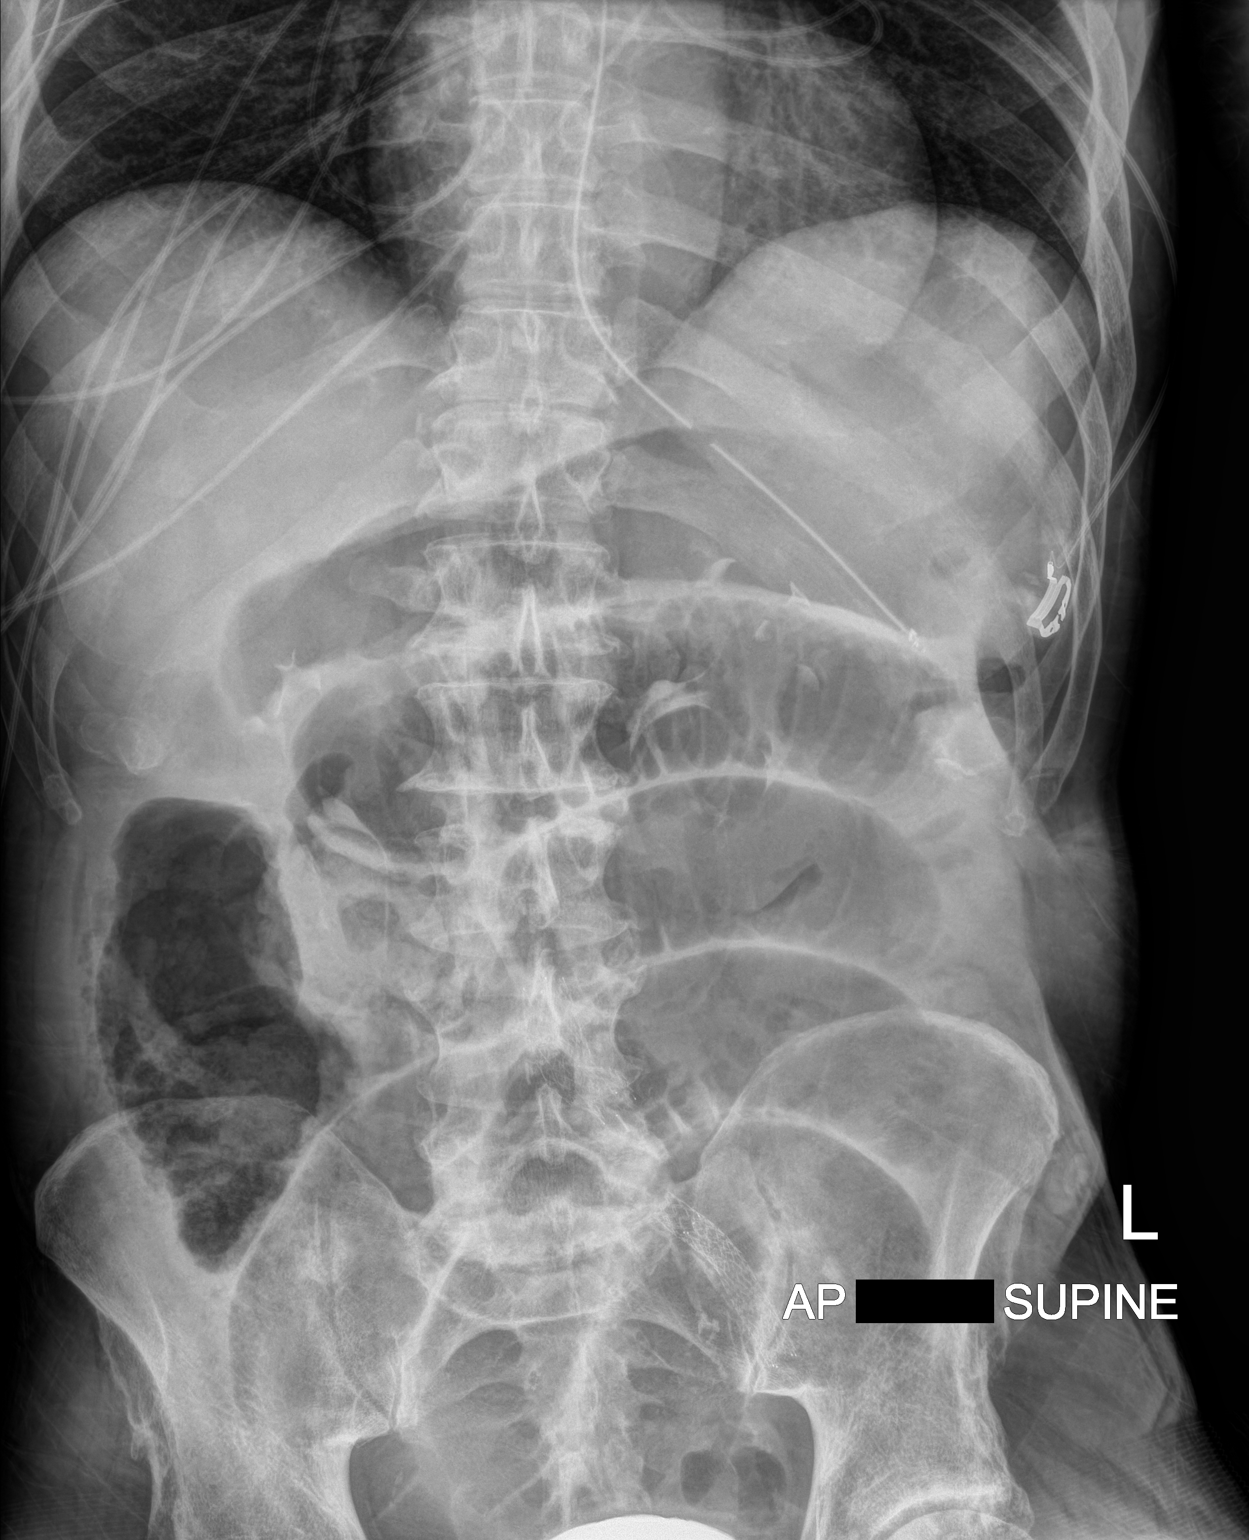

[1 of 1 positions shown; findings below may reference images not displayed]

FINDINGS: The patient's enteric tube is seen ending overlying the body of the
stomach, with the side port about the gastroesophageal junction.

There is dilatation of small bowel loops up to 4.9 cm in maximal
diameter, compatible with small bowel obstruction as previously
noted. No definite free air is seen within the abdomen, though
evaluation is limited on a single supine view.

No acute osseous abnormalities are identified. A left common iliac
stent is noted. The visualized lung apices are grossly clear.
IMPRESSION: 1. Enteric tube seen ending overlying the body of the stomach, with
the side port about the gastroesophageal junction.
2. Dilatation of small bowel loops up to 4.9 cm in maximal diameter,
compatible with small bowel obstruction as previously noted. On
further evaluation of recent CT, the appearance is suspicious for a
somewhat complex mesenteric volvulus at the lower abdomen with
concern for closed loop obstruction, and decreased enhancement of a
long segment of the distal ileum best noted on coronal images,
concerning for impending bowel ischemia. There is approximately 360
degrees of twisting at the mesenteric axis. Would correlate
clinically.
These results were called by telephone at the time of interpretation
on 11/22/2015 at [DATE] to Dr. Zoe, who verbally acknowledged
these results.

## 2017-09-08 IMAGING — DX DG ABDOMEN 1V
1 series · 1 of 1 positions shown · non-contrast
Comparison: 11/21/2015

CLINICAL DATA: Follow-up SBO

EXAM:
ABDOMEN - 1 VIEW

[abdomen kub]
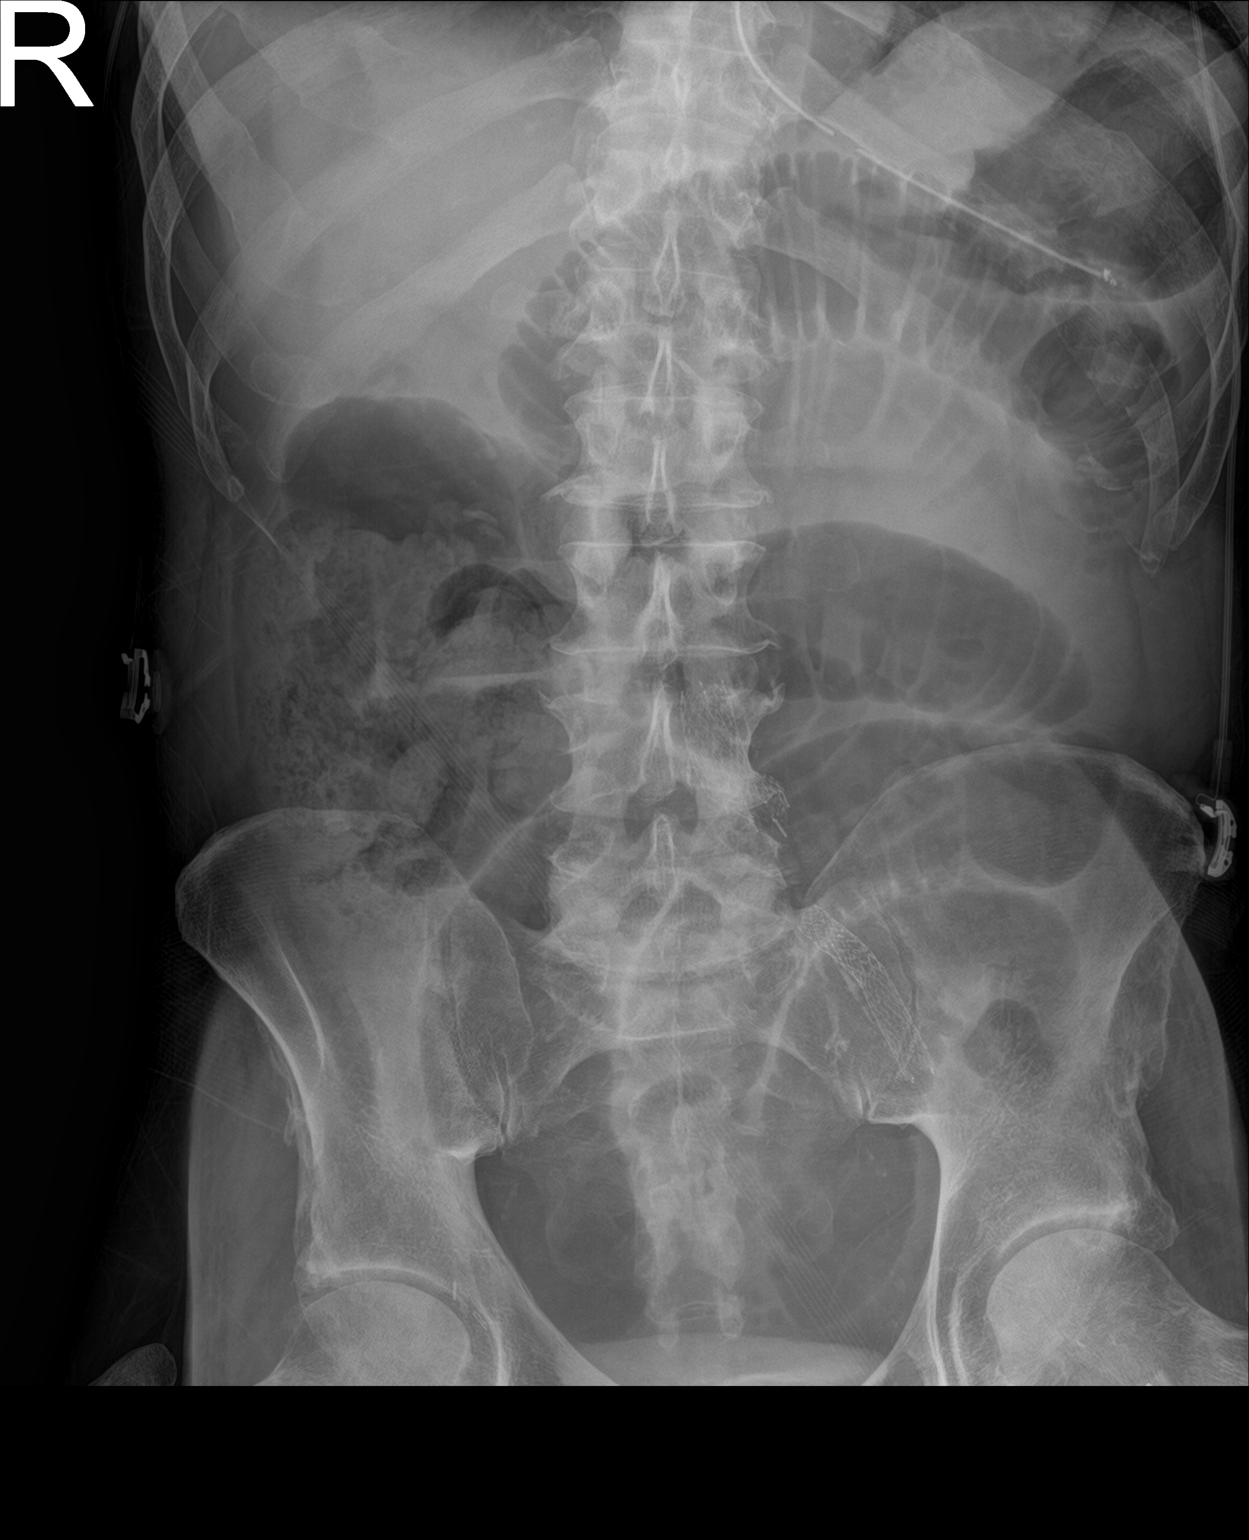

[1 of 1 positions shown; findings below may reference images not displayed]

FINDINGS: Enteric tube in the proximal stomach.

Moderate dilatation of multiple small bowel loops, compatible with
persistent small bowel obstruction.

Vascular stents.

Residual extra contrast in the bladder.
IMPRESSION: Enteric tube in the proximal stomach.

Stable small bowel dilatation, consistent with persistent small
bowel obstruction.

## 2017-11-10 ENCOUNTER — Ambulatory Visit (INDEPENDENT_AMBULATORY_CARE_PROVIDER_SITE_OTHER): Payer: Medicare Other | Admitting: Family

## 2017-11-10 ENCOUNTER — Encounter: Payer: Self-pay | Admitting: *Deleted

## 2017-11-10 ENCOUNTER — Other Ambulatory Visit: Payer: Self-pay

## 2017-11-10 ENCOUNTER — Encounter: Payer: Self-pay | Admitting: Family

## 2017-11-10 ENCOUNTER — Ambulatory Visit (HOSPITAL_COMMUNITY)
Admission: RE | Admit: 2017-11-10 | Discharge: 2017-11-10 | Disposition: A | Discharge status: Home / Self Care | Payer: Medicare Other | Source: Ambulatory Visit | Attending: Surgery | Admitting: Surgery

## 2017-11-10 VITALS — BP 142/92 | HR 62 | Temp 97.9°F | Resp 17 | Wt 135.6 lb

## 2017-11-10 DIAGNOSIS — I779 Disorder of arteries and arterioles, unspecified: Secondary | ICD-10-CM | POA: Diagnosis not present

## 2017-11-10 DIAGNOSIS — F17200 Nicotine dependence, unspecified, uncomplicated: Secondary | ICD-10-CM

## 2017-11-10 DIAGNOSIS — Z95828 Presence of other vascular implants and grafts: Secondary | ICD-10-CM | POA: Diagnosis not present

## 2017-11-10 DIAGNOSIS — E1151 Type 2 diabetes mellitus with diabetic peripheral angiopathy without gangrene: Secondary | ICD-10-CM | POA: Diagnosis not present

## 2017-11-10 DIAGNOSIS — G894 Chronic pain syndrome: Secondary | ICD-10-CM | POA: Diagnosis not present

## 2017-11-10 DIAGNOSIS — I739 Peripheral vascular disease, unspecified: Secondary | ICD-10-CM

## 2017-11-10 NOTE — Patient Instructions (Signed)
Steps to Quit Smoking Smoking tobacco can be bad for your health. It can also affect almost every organ in your body. Smoking puts you and people around you at risk for many serious long-lasting (chronic) diseases. Quitting smoking is hard, but it is one of the best things that you can do for your health. It is never too late to quit. What are the benefits of quitting smoking? When you quit smoking, you lower your risk for getting serious diseases and conditions. They can include:  Lung cancer or lung disease.  Heart disease.  Stroke.  Heart attack.  Not being able to have children (infertility).  Weak bones (osteoporosis) and broken bones (fractures).  If you have coughing, wheezing, and shortness of breath, those symptoms may get better when you quit. You may also get sick less often. If you are pregnant, quitting smoking can help to lower your chances of having a baby of low birth weight. What can I do to help me quit smoking? Talk with your doctor about what can help you quit smoking. Some things you can do (strategies) include:  Quitting smoking totally, instead of slowly cutting back how much you smoke over a period of time.  Going to in-person counseling. You are more likely to quit if you go to many counseling sessions.  Using resources and support systems, such as: ? Online chats with a counselor. ? Phone quitlines. ? Printed self-help materials. ? Support groups or group counseling. ? Text messaging programs. ? Mobile phone apps or applications.  Taking medicines. Some of these medicines may have nicotine in them. If you are pregnant or breastfeeding, do not take any medicines to quit smoking unless your doctor says it is okay. Talk with your doctor about counseling or other things that can help you.  Talk with your doctor about using more than one strategy at the same time, such as taking medicines while you are also going to in-person counseling. This can help make  quitting easier. What things can I do to make it easier to quit? Quitting smoking might feel very hard at first, but there is a lot that you can do to make it easier. Take these steps:  Talk to your family and friends. Ask them to support and encourage you.  Call phone quitlines, reach out to support groups, or work with a counselor.  Ask people who smoke to not smoke around you.  Avoid places that make you want (trigger) to smoke, such as: ? Bars. ? Parties. ? Smoke-break areas at work.  Spend time with people who do not smoke.  Lower the stress in your life. Stress can make you want to smoke. Try these things to help your stress: ? Getting regular exercise. ? Deep-breathing exercises. ? Yoga. ? Meditating. ? Doing a body scan. To do this, close your eyes, focus on one area of your body at a time from head to toe, and notice which parts of your body are tense. Try to relax the muscles in those areas.  Download or buy apps on your mobile phone or tablet that can help you stick to your quit plan. There are many free apps, such as QuitGuide from the CDC (Centers for Disease Control and Prevention). You can find more support from smokefree.gov and other websites.  This information is not intended to replace advice given to you by your health care provider. Make sure you discuss any questions you have with your health care provider. Document Released: 09/07/2009 Document   Revised: 07/09/2016 Document Reviewed: 03/28/2015 Elsevier Interactive Patient Education  2018 [employer]vier Inc.     Peripheral Vascular Disease Peripheral vascular disease (PVD) is a disease of the blood vessels that are not part of your heart and brain. A simple term for PVD is poor circulation. In most cases, PVD narrows the blood vessels that carry blood from your heart to the rest of your body. This can result in a decreased supply of blood to your arms, legs, and internal organs, like your stomach or kidneys.  However, it most often affects a person's lower legs and feet. There are two types of PVD.  Organic PVD. This is the more common type. It is caused by damage to the structure of blood vessels.  Functional PVD. This is caused by conditions that make blood vessels contract and tighten (spasm).  Without treatment, PVD tends to get worse over time. PVD can also lead to acute ischemic limb. This is when an arm or limb suddenly has trouble getting enough blood. This is a medical emergency. Follow these instructions at home:  Take medicines only as told by your doctor.  Do not use any tobacco products, including cigarettes, chewing tobacco, or electronic cigarettes. If you need help quitting, ask your doctor.  Lose weight if you are overweight, and maintain a healthy weight as told by your doctor.  Eat a diet that is low in fat and cholesterol. If you need help, ask your doctor.  Exercise regularly. Ask your doctor for some good activities for you.  Take good care of your feet. ? Wear comfortable shoes that fit well. ? Check your feet often for any cuts or sores. Contact a doctor if:  You have cramps in your legs while walking.  You have leg pain when you are at rest.  You have coldness in a leg or foot.  Your skin changes.  You are unable to get or have an erection (erectile dysfunction).  You have cuts or sores on your feet that are not healing. Get help right away if:  Your arm or leg turns cold and blue.  Your arms or legs become red, warm, swollen, painful, or numb.  You have chest pain or trouble breathing.  You suddenly have weakness in your face, arm, or leg.  You become very confused or you cannot speak.  You suddenly have a very bad headache.  You suddenly cannot see. This information is not intended to replace advice given to you by your health care provider. Make sure you discuss any questions you have with your health care provider. Document Released:  02/05/2010 Document Revised: 04/18/2016 Document Reviewed: 04/21/2014 Elsevier Interactive Patient Education  2017 Elsevier Inc.  

## 2017-11-10 NOTE — Progress Notes (Signed)
[facility] [place]   CC: 1-2 week hx of feet and right calf pain, with history of peripheral artery occlusive disease  History of Present Illness [name] [name] is a 66 y.o. male who is s/p left femoral to below-knee popliteal bypass with saphenous vein on 07/28/2015 by Dr. Donnetta Hutching.  He is also s/p aorto- bilateral femoral artery bypass graft on 12-31- 2012 by Dr. Donnetta Hutching.  He is also s/i iliac artery stent placement, possibly right, in June 2012.    Dr. Donnetta Hutching last saw pt on 12-27-16. At that time noninvasive studies revealed a difficult to interpret flow in his left leg; did appear that his graft runs just alongside his native artery. There was patency and this was similar to what was seen the last time. He had an ankle arm index of 0.6 bilaterally. Continue his walking program, return again in one year.  He returns today with c/o pain that started about 1-2 weeks ago in both feet and right calf. He has chronic pain and states he take 15 mg oxycodone 3x/day and 15 mg OxyContin twice/day.   He had surgery in January 2017 for a small bowel obstruction.   Pt Diabetic: Yes, states his A1C is in good control Pt smoker: smoker (admits to 1/2 ppd x 47 yrs)  Pt meds include: Statin :No Betablocker: No ASA: No Other anticoagulants/antiplatelets: no   Past Medical History:  Diagnosis Date  . Diabetes mellitus without complication ([place])   . Hypertension     Social History Social History   Tobacco Use  . Smoking status: Current Every Day Smoker    Packs/day: 0.50    Years: 46.00    Pack years: 23.00    Types: Cigarettes  . Smokeless tobacco: Never Used  Substance Use Topics  . Alcohol use: No    Alcohol/week: 0.0 oz    Comment: a12/17/20214 "last drink was 3-4 yr ago; never had problem w/it"  . Drug use: No    Comment: stopped marijuana May 07, 2012, stopped cocaine 1980    Family History Family History  Problem Relation Age of Onset  . Hypertension  Mother   . Heart attack Mother   . Diabetes Mother   . Heart disease Mother   . Aneurysm Father   . Heart disease Father   . Heart attack Sister   . Heart disease Sister   . Heart disease Brother        heart transplant    Past Surgical History:  Procedure Laterality Date  . ABDOMINAL AORTAGRAM N/A 11/11/2011   Procedure: ABDOMINAL Maxcine Ham;  Surgeon: Angelia Mould, MD;  Location: [facility] CATH LAB;  Service: Cardiovascular;  Laterality: N/A;  . AORTA - BILATERAL FEMORAL ARTERY BYPASS GRAFT  11/25/2011   Procedure: AORTA BIFEMORAL BYPASS GRAFT;  Surgeon: Rosetta Posner, MD;  Location: [name];  Service: Vascular;  Laterality: N/A;  . COLONOSCOPY    . FEMORAL-POPLITEAL BYPASS GRAFT Left 07/28/2015   Procedure: LEFT FEMORAL-POPLITEAL ARTERY BYPASS GRAFT;  Surgeon: Rosetta Posner, MD;  Location: [place];  Service: Vascular;  Laterality: Left;  . ILIAC ARTERY STENT  05/06/2011  . INGUINAL HERNIA REPAIR Right   . LAPAROTOMY N/A 12/01/2015   Procedure: EXPLORATORY LAPAROTOMY WITH RADICAL PERITONEAL DEBRIDEMENT, CLOSURE OF SMALL BOWEL PERFORATION AND LYSIS OF ADHESIONS.;  Surgeon: Johnathan Hausen, MD;  Location: [facility];  Service: General;  Laterality: N/A;  . LEFT HEART CATHETERIZATION WITH CORONARY ANGIOGRAM N/A 06/07/2013   Procedure: LEFT HEART CATHETERIZATION  WITH CORONARY ANGIOGRAM;  Surgeon: Sherren Mocha, MD;  Location: [facility] CATH LAB;  Service: Cardiovascular;  Laterality: N/A;  . LEFT HEART CATHETERIZATION WITH CORONARY ANGIOGRAM N/A 12/10/2013   Procedure: LEFT HEART CATHETERIZATION WITH CORONARY ANGIOGRAM;  Surgeon: Blane Ohara, MD;  Location: [facility] CATH LAB;  Service: Cardiovascular;  Laterality: N/A;  . LOWER EXTREMITY ANGIOGRAM Bilateral 11/11/2011   Procedure: LOWER EXTREMITY ANGIOGRAM;  Surgeon: Angelia Mould, MD;  Location: [facility] CATH LAB;  Service: Cardiovascular;  Laterality: Bilateral;  . PERCUTANEOUS CORONARY STENT INTERVENTION (PCI-S) Right 06/07/2013   Procedure: PERCUTANEOUS  CORONARY STENT INTERVENTION (PCI-S);  Surgeon: Sherren Mocha, MD;  Location: [facility] CATH LAB;  Service: Cardiovascular;  Laterality: Right;  . PERIPHERAL VASCULAR CATHETERIZATION N/A 07/26/2015   Procedure: Abdominal Aortogram;  Surgeon: Serafina Mitchell, MD;  Location: [place] CV LAB;  Service: Cardiovascular;  Laterality: N/A;  . PERIPHERAL VASCULAR CATHETERIZATION Bilateral 07/26/2015   Procedure: Lower Extremity Angiography;  Surgeon: Serafina Mitchell, MD;  Location: [place] CV LAB;  Service: Cardiovascular;  Laterality: Bilateral;  . PR VEIN BYPASS GRAFT,AORTO-FEM-POP  11/24/2012  . TONSILLECTOMY      Allergies  Allergen Reactions  . Zetia [Ezetimibe] Anaphylaxis and Swelling    Tongue and throat  Tongue and throat  Tongue and throat   . Penicillins Other (See Comments) and Itching    Unknown Unknown.  Tolerates cefepime  . Pravastatin Sodium Other (See Comments)    Pravastatin 40 mg qday and 40 mg q M/W/F caused muscle aches Pravastatin 40 mg qday and 40 mg q M/W/F caused muscle aches Pravastatin 40 mg qday and 40 mg q M/W/F caused muscle aches  . Atorvastatin Other (See Comments) and Rash    Malaise & muscle weakness  . Crestor [Rosuvastatin] Other (See Comments) and Rash    Malaise & muscle weakness  . Lisinopril Rash  . Promethazine Rash    Current Outpatient Medications  Medication Sig Dispense Refill  . acetaminophen (TYLENOL) 325 MG tablet Take 2 tablets (650 mg total) by mouth every 6 (six) hours as needed for fever. 30 tablet 0  . albuterol (PROVENTIL HFA;VENTOLIN HFA) 108 (90 BASE) MCG/ACT inhaler Inhale 2 puffs into the lungs every 6 (six) hours as needed for wheezing or shortness of breath. 1 Inhaler 2  . alum & mag hydroxide-simeth (MAALOX/MYLANTA) 200-200-20 MG/5ML suspension Take 30 mLs by mouth every 6 (six) hours as needed for indigestion or heartburn (or bloating). 355 mL 0  . antiseptic oral rinse (CPC / CETYLPYRIDINIUM CHLORIDE 0.05%) 0.05 % LIQD solution  7 mLs by Mouth Rinse route 2 times daily at 12 noon and 4 pm. 30 mL 0  . bisacodyl (DULCOLAX) 10 MG suppository Place 1 suppository (10 mg total) rectally every 12 (twelve) hours as needed for mild constipation or moderate constipation. 12 suppository 0  . cephALEXin (KEFLEX) 500 MG capsule Take 1 capsule (500 mg total) by mouth 3 (three) times daily. 42 capsule 0  . collagenase (SANTYL) ointment Apply thin layer to wound bed of left foot daily 30 g 1  . dicyclomine (BENTYL) 20 MG tablet Take 1 tablet (20 mg total) by mouth 2 (two) times daily. 20 tablet 0  . diphenhydrAMINE (BENADRYL) 25 mg capsule Take 1 capsule (25 mg total) by mouth every 6 (six) hours as needed for itching, allergies or sleep (anxiety). 30 capsule 0  . feeding supplement, ENSURE ENLIVE, (ENSURE ENLIVE) LIQD Take 237 mLs by mouth 3 (three) times daily between meals. 237 mL 12  .  gabapentin (NEURONTIN) 300 MG capsule Take 300 mg by mouth 3 (three) times daily.    . insulin aspart (NOVOLOG) 100 UNIT/ML injection Inject 0-15 Units into the skin 3 (three) times daily with meals. 10 mL 11  . magnesium oxide (MAG-OX) 400 (241.3 Mg) MG tablet Take 1 tablet (400 mg total) by mouth 2 (two) times daily. 60 tablet 0  . methocarbamol (ROBAXIN) 500 MG tablet Take 2 tablets (1,000 mg total) by mouth 3 (three) times daily. 90 tablet 0  . mirtazapine (REMERON) 15 MG tablet Take 15 mg by mouth at bedtime. Reported on 05/08/2016    . ondansetron (ZOFRAN) 4 MG tablet Take 1 tablet (4 mg total) by mouth every 6 (six) hours. 12 tablet 0  . oxyCODONE (OXYCONTIN) 15 mg 12 hr tablet Take 15 mg by mouth every 12 (twelve) hours.    . Sennosides (SENNA-LAX PO) Take 2 tablets by mouth daily.    Marland Kitchen thiamine 100 MG tablet Take 1 tablet (100 mg total) by mouth daily. 30 tablet 0  . traMADol (ULTRAM) 50 MG tablet Take 1-2 tablets by mouth every 6 hours as needed for pain 40 tablet 0  . triamcinolone cream (KENALOG) 0.1 % Apply 1 application topically 2 (two)  times daily as needed (for eczema).    . vitamin C (ASCORBIC ACID) 500 MG tablet Take 500 mg by mouth 2 (two) times daily.    Marland Kitchen zinc sulfate 220 MG capsule Take 220 mg by mouth daily. X 45 days until 02/23/16     No current facility-administered medications for this visit.     ROS: See HPI for pertinent positives and negatives.   Physical Examination  Vitals:   11/10/17 1541 11/10/17 1544  BP: 140/89 (!) 142/92  Pulse: 62   Resp: 17   Temp: 97.9 F (36.6 C)   TempSrc: Oral   SpO2: 100%   Weight: 135 lb 9.6 oz (61.5 kg)    Body mass index is 16.95 kg/m.  General: A&O x 3, WD, thin male. Gait: slow, using walking stick Eyes: Pupils are equal Pulmonary: Respirations are non labored, CTAB, adequate air movement in all fields.  Cardiac: Regular rythm and rate, no murmur appreciated    VASCULAR EXAM: No carotid bruits. Abdominal aortic pulse is not palpable. Radial pulses: 2+ palpable and =  Extremities without ischemic changes, no gangrene; no open wounds. The left great toenail is missing.       LE Pulses Right Left   FEMORAL 2+ palpable 2+ palpable    POPLITEAL not palpable  not palpable   POSTERIOR TIBIAL Not palpable  not palpable    DORSALIS PEDIS  ANTERIOR TIBIAL Not palpable  not palpable    PERONEAL Not palpable  not Palpable    Abdomen: soft, NT, no palpable masses. Skin: no rashes, see Extremities Musculoskeletal: no muscle wasting or atrophy. Neurologic: A&O X 3; Appropriate Affect; SENSATION:absent in feet; MOTOR FUNCTION: moving all extremities equally, able to wiggle all toes, motor strength 5/5 throughout. Speech is fluent/normal. CN 2-12 grossly intact    ASSESSMENT: [name] [name] is a 66 y.o. male who is s/p left femoral  to below-knee popliteal bypass with saphenous vein on 07/28/2015.  He is also s/p aorto- bilateral femoral artery bypass graft on 12-31- 2012 by Dr. Donnetta Hutching.  He is also s/i iliac artery stent placement, possibly right, in June 2012.   He returns today with c/o pain that started about 1-2 weeks ago in both feet and right calf,  numbness in both feet He has chronic pain and states he take 15 mg oxycodone 3x/day and 15 mg OxyContin twice/day.  He states he has severe pain in his right calf, but he does not seem in sever distress. There are no signs of ischemia in his feet or legs, no gangrene, no ulcers, no open wounds.  Last serum creatinine result we have on file was 0.69 on 02-13-16.   Pt's atherosclerotic risk factors include well controlled DM currently and continued smoking, currently 1/2 ppd, ongoing for about 47 years. He is not taking a statin, not taking ASA or any antiplatelet agent.  DATA  ABI (Date: 11/10/2017):  R:   ABI: 0.00 (was 0.59 on 01-14-17),   PT: absent  DP: absent  TBI:  0.00 (was 0.50)  L:   ABI: 0.00 (was 0.62),   PT: absent  DP: absent  TBI: 0.34 (was 0.56) Bilateral ABI is 0 with absent waveforms bilaterally. Right TBI is 0, left is 0.34.  Severe decline in bilateral ABI.    PLAN:  Based on the patient's vascular studies and examination, and after Dr. Trula Slade spoke with and examined pt, pt will be scheduled for arteriogram with bilateral run off, possible intervention, by Dr. Trula Slade on 11-13-17.   The patient was counseled re smoking cessation and given several free resources re smoking cessation.  I discussed in depth with the patient the nature of atherosclerosis, and emphasized the importance of maximal medical management including strict control of blood pressure, blood glucose, and lipid levels, obtaining regular exercise, and cessation of smoking.  The patient is aware that without maximal medical management the underlying atherosclerotic  disease process will progress, limiting the benefit of any interventions.  The patient was given information about PAD including signs, symptoms, treatment, what symptoms should prompt the patient to seek immediate medical care, and risk reduction measures to take.  Clemon Chambers, RN, MSN, FNP-C [facility] [employer] Phone: [phone]  Clinic MD: Trula Slade  11/10/17 3:58 PM

## 2017-11-10 NOTE — H&P (View-Only) (Signed)
[facility] OF [place]   CC: 1-2 week hx of feet and right calf pain, with history of peripheral artery occlusive disease  History of Present Illness [name] [name] is a 66 y.o. male who is s/p left femoral to below-knee popliteal bypass with saphenous vein on 07/28/2015 by Dr. Donnetta Hutching.  He is also s/p aorto- bilateral femoral artery bypass graft on 12-31- 2012 by Dr. Donnetta Hutching.  He is also s/i iliac artery stent placement, possibly right, in June 2012.    Dr. Donnetta Hutching last saw pt on 12-27-16. At that time noninvasive studies revealed a difficult to interpret flow in his left leg; did appear that his graft runs just alongside his native artery. There was patency and this was similar to what was seen the last time. He had an ankle arm index of 0.6 bilaterally. Continue his walking program, return again in one year.  He returns today with c/o pain that started about 1-2 weeks ago in both feet and right calf. He has chronic pain and states he take 15 mg oxycodone 3x/day and 15 mg OxyContin twice/day.   He had surgery in January 2017 for a small bowel obstruction.   Pt Diabetic: Yes, states his A1C is in good control Pt smoker: smoker (admits to 1/2 ppd x 47 yrs)  Pt meds include: Statin :No Betablocker: No ASA: No Other anticoagulants/antiplatelets: no   Past Medical History:  Diagnosis Date  . Diabetes mellitus without complication ([place])   . Hypertension     Social History Social History   Tobacco Use  . Smoking status: Current Every Day Smoker    Packs/day: 0.50    Years: 46.00    Pack years: 23.00    Types: Cigarettes  . Smokeless tobacco: Never Used  Substance Use Topics  . Alcohol use: No    Alcohol/week: 0.0 oz    Comment: a12/17/20214 "last drink was 3-4 yr ago; never had problem w/it"  . Drug use: No    Comment: stopped marijuana May 07, 2012, stopped cocaine 1980    Family History Family History  Problem Relation Age of Onset  . Hypertension  Mother   . Heart attack Mother   . Diabetes Mother   . Heart disease Mother   . Aneurysm Father   . Heart disease Father   . Heart attack Sister   . Heart disease Sister   . Heart disease Brother        heart transplant    Past Surgical History:  Procedure Laterality Date  . ABDOMINAL AORTAGRAM N/A 11/11/2011   Procedure: ABDOMINAL Maxcine Ham;  Surgeon: Angelia Mould, MD;  Location: [facility] CATH LAB;  Service: Cardiovascular;  Laterality: N/A;  . AORTA - BILATERAL FEMORAL ARTERY BYPASS GRAFT  11/25/2011   Procedure: AORTA BIFEMORAL BYPASS GRAFT;  Surgeon: Rosetta Posner, MD;  Location: [place];  Service: Vascular;  Laterality: N/A;  . COLONOSCOPY    . FEMORAL-POPLITEAL BYPASS GRAFT Left 07/28/2015   Procedure: LEFT FEMORAL-POPLITEAL ARTERY BYPASS GRAFT;  Surgeon: Rosetta Posner, MD;  Location: [place];  Service: Vascular;  Laterality: Left;  . ILIAC ARTERY STENT  05/06/2011  . INGUINAL HERNIA REPAIR Right   . LAPAROTOMY N/A 12/01/2015   Procedure: EXPLORATORY LAPAROTOMY WITH RADICAL PERITONEAL DEBRIDEMENT, CLOSURE OF SMALL BOWEL PERFORATION AND LYSIS OF ADHESIONS.;  Surgeon: Johnathan Hausen, MD;  Location: [facility];  Service: General;  Laterality: N/A;  . LEFT HEART CATHETERIZATION WITH CORONARY ANGIOGRAM N/A 06/07/2013   Procedure: LEFT HEART CATHETERIZATION  WITH CORONARY ANGIOGRAM;  Surgeon: Sherren Mocha, MD;  Location: [facility] CATH LAB;  Service: Cardiovascular;  Laterality: N/A;  . LEFT HEART CATHETERIZATION WITH CORONARY ANGIOGRAM N/A 12/10/2013   Procedure: LEFT HEART CATHETERIZATION WITH CORONARY ANGIOGRAM;  Surgeon: Blane Ohara, MD;  Location: [facility] CATH LAB;  Service: Cardiovascular;  Laterality: N/A;  . LOWER EXTREMITY ANGIOGRAM Bilateral 11/11/2011   Procedure: LOWER EXTREMITY ANGIOGRAM;  Surgeon: Angelia Mould, MD;  Location: [facility] CATH LAB;  Service: Cardiovascular;  Laterality: Bilateral;  . PERCUTANEOUS CORONARY STENT INTERVENTION (PCI-S) Right 06/07/2013   Procedure: PERCUTANEOUS  CORONARY STENT INTERVENTION (PCI-S);  Surgeon: Sherren Mocha, MD;  Location: [facility] CATH LAB;  Service: Cardiovascular;  Laterality: Right;  . PERIPHERAL VASCULAR CATHETERIZATION N/A 07/26/2015   Procedure: Abdominal Aortogram;  Surgeon: Serafina Mitchell, MD;  Location: [place] CV LAB;  Service: Cardiovascular;  Laterality: N/A;  . PERIPHERAL VASCULAR CATHETERIZATION Bilateral 07/26/2015   Procedure: Lower Extremity Angiography;  Surgeon: Serafina Mitchell, MD;  Location: [place] CV LAB;  Service: Cardiovascular;  Laterality: Bilateral;  . PR VEIN BYPASS GRAFT,AORTO-FEM-POP  11/24/2012  . TONSILLECTOMY      Allergies  Allergen Reactions  . Zetia [Ezetimibe] Anaphylaxis and Swelling    Tongue and throat  Tongue and throat  Tongue and throat   . Penicillins Other (See Comments) and Itching    Unknown Unknown.  Tolerates cefepime  . Pravastatin Sodium Other (See Comments)    Pravastatin 40 mg qday and 40 mg q M/W/F caused muscle aches Pravastatin 40 mg qday and 40 mg q M/W/F caused muscle aches Pravastatin 40 mg qday and 40 mg q M/W/F caused muscle aches  . Atorvastatin Other (See Comments) and Rash    Malaise & muscle weakness  . Crestor [Rosuvastatin] Other (See Comments) and Rash    Malaise & muscle weakness  . Lisinopril Rash  . Promethazine Rash    Current Outpatient Medications  Medication Sig Dispense Refill  . acetaminophen (TYLENOL) 325 MG tablet Take 2 tablets (650 mg total) by mouth every 6 (six) hours as needed for fever. 30 tablet 0  . albuterol (PROVENTIL HFA;VENTOLIN HFA) 108 (90 BASE) MCG/ACT inhaler Inhale 2 puffs into the lungs every 6 (six) hours as needed for wheezing or shortness of breath. 1 Inhaler 2  . alum & mag hydroxide-simeth (MAALOX/MYLANTA) 200-200-20 MG/5ML suspension Take 30 mLs by mouth every 6 (six) hours as needed for indigestion or heartburn (or bloating). 355 mL 0  . antiseptic oral rinse (CPC / CETYLPYRIDINIUM CHLORIDE 0.05%) 0.05 % LIQD solution  7 mLs by Mouth Rinse route 2 times daily at 12 noon and 4 pm. 30 mL 0  . bisacodyl (DULCOLAX) 10 MG suppository Place 1 suppository (10 mg total) rectally every 12 (twelve) hours as needed for mild constipation or moderate constipation. 12 suppository 0  . cephALEXin (KEFLEX) 500 MG capsule Take 1 capsule (500 mg total) by mouth 3 (three) times daily. 42 capsule 0  . collagenase (SANTYL) ointment Apply thin layer to wound bed of left foot daily 30 g 1  . dicyclomine (BENTYL) 20 MG tablet Take 1 tablet (20 mg total) by mouth 2 (two) times daily. 20 tablet 0  . diphenhydrAMINE (BENADRYL) 25 mg capsule Take 1 capsule (25 mg total) by mouth every 6 (six) hours as needed for itching, allergies or sleep (anxiety). 30 capsule 0  . feeding supplement, ENSURE ENLIVE, (ENSURE ENLIVE) LIQD Take 237 mLs by mouth 3 (three) times daily between meals. 237 mL 12  .  gabapentin (NEURONTIN) 300 MG capsule Take 300 mg by mouth 3 (three) times daily.    . insulin aspart (NOVOLOG) 100 UNIT/ML injection Inject 0-15 Units into the skin 3 (three) times daily with meals. 10 mL 11  . magnesium oxide (MAG-OX) 400 (241.3 Mg) MG tablet Take 1 tablet (400 mg total) by mouth 2 (two) times daily. 60 tablet 0  . methocarbamol (ROBAXIN) 500 MG tablet Take 2 tablets (1,000 mg total) by mouth 3 (three) times daily. 90 tablet 0  . mirtazapine (REMERON) 15 MG tablet Take 15 mg by mouth at bedtime. Reported on 05/08/2016    . ondansetron (ZOFRAN) 4 MG tablet Take 1 tablet (4 mg total) by mouth every 6 (six) hours. 12 tablet 0  . oxyCODONE (OXYCONTIN) 15 mg 12 hr tablet Take 15 mg by mouth every 12 (twelve) hours.    . Sennosides (SENNA-LAX PO) Take 2 tablets by mouth daily.    Marland Kitchen thiamine 100 MG tablet Take 1 tablet (100 mg total) by mouth daily. 30 tablet 0  . traMADol (ULTRAM) 50 MG tablet Take 1-2 tablets by mouth every 6 hours as needed for pain 40 tablet 0  . triamcinolone cream (KENALOG) 0.1 % Apply 1 application topically 2 (two)  times daily as needed (for eczema).    . vitamin C (ASCORBIC ACID) 500 MG tablet Take 500 mg by mouth 2 (two) times daily.    Marland Kitchen zinc sulfate 220 MG capsule Take 220 mg by mouth daily. X 45 days until 02/23/16     No current facility-administered medications for this visit.     ROS: See HPI for pertinent positives and negatives.   Physical Examination  Vitals:   11/10/17 1541 11/10/17 1544  BP: 140/89 (!) 142/92  Pulse: 62   Resp: 17   Temp: 97.9 F (36.6 C)   TempSrc: Oral   SpO2: 100%   Weight: 135 lb 9.6 oz (61.5 kg)    Body mass index is 16.95 kg/m.  General: A&O x 3, WD, thin male. Gait: slow, using walking stick Eyes: Pupils are equal Pulmonary: Respirations are non labored, CTAB, adequate air movement in all fields.  Cardiac: Regular rythm and rate, no murmur appreciated    VASCULAR EXAM: No carotid bruits. Abdominal aortic pulse is not palpable. Radial pulses: 2+ palpable and =  Extremities without ischemic changes, no gangrene; no open wounds. The left great toenail is missing.       LE Pulses Right Left   FEMORAL 2+ palpable 2+ palpable    POPLITEAL not palpable  not palpable   POSTERIOR TIBIAL Not palpable  not palpable    DORSALIS PEDIS  ANTERIOR TIBIAL Not palpable  not palpable    PERONEAL Not palpable  not Palpable    Abdomen: soft, NT, no palpable masses. Skin: no rashes, see Extremities Musculoskeletal: no muscle wasting or atrophy. Neurologic: A&O X 3; Appropriate Affect; SENSATION:absent in feet; MOTOR FUNCTION: moving all extremities equally, able to wiggle all toes, motor strength 5/5 throughout. Speech is fluent/normal. CN 2-12 grossly intact    ASSESSMENT: [name] [name] is a 66 y.o. male who is s/p left femoral  to below-knee popliteal bypass with saphenous vein on 07/28/2015.  He is also s/p aorto- bilateral femoral artery bypass graft on 12-31- 2012 by Dr. Donnetta Hutching.  He is also s/i iliac artery stent placement, possibly right, in June 2012.   He returns today with c/o pain that started about 1-2 weeks ago in both feet and right calf,  numbness in both feet He has chronic pain and states he take 15 mg oxycodone 3x/day and 15 mg OxyContin twice/day.  He states he has severe pain in his right calf, but he does not seem in sever distress. There are no signs of ischemia in his feet or legs, no gangrene, no ulcers, no open wounds.  Last serum creatinine result we have on file was 0.69 on 02-13-16.   Pt's atherosclerotic risk factors include well controlled DM currently and continued smoking, currently 1/2 ppd, ongoing for about 47 years. He is not taking a statin, not taking ASA or any antiplatelet agent.  DATA  ABI (Date: 11/10/2017):  R:   ABI: 0.00 (was 0.59 on 01-14-17),   PT: absent  DP: absent  TBI:  0.00 (was 0.50)  L:   ABI: 0.00 (was 0.62),   PT: absent  DP: absent  TBI: 0.34 (was 0.56) Bilateral ABI is 0 with absent waveforms bilaterally. Right TBI is 0, left is 0.34.  Severe decline in bilateral ABI.    PLAN:  Based on the patient's vascular studies and examination, and after Dr. Trula Slade spoke with and examined pt, pt will be scheduled for arteriogram with bilateral run off, possible intervention, by Dr. Trula Slade on 11-13-17.   The patient was counseled re smoking cessation and given several free resources re smoking cessation.  I discussed in depth with the patient the nature of atherosclerosis, and emphasized the importance of maximal medical management including strict control of blood pressure, blood glucose, and lipid levels, obtaining regular exercise, and cessation of smoking.  The patient is aware that without maximal medical management the underlying atherosclerotic  disease process will progress, limiting the benefit of any interventions.  The patient was given information about PAD including signs, symptoms, treatment, what symptoms should prompt the patient to seek immediate medical care, and risk reduction measures to take.  Clemon Chambers, RN, MSN, FNP-C [facility] [employer] [facility] Phone: [phone]  Clinic MD: Trula Slade  11/10/17 3:58 PM

## 2017-11-11 ENCOUNTER — Other Ambulatory Visit: Payer: Self-pay | Admitting: *Deleted

## 2017-11-13 ENCOUNTER — Other Ambulatory Visit: Payer: Self-pay

## 2017-11-13 ENCOUNTER — Inpatient Hospital Stay (HOSPITAL_COMMUNITY)
Admission: RE | Admit: 2017-11-13 | Discharge: 2017-11-16 | DRG: 253 | Disposition: A | Discharge status: Home / Self Care | Payer: Medicare Other | Source: Ambulatory Visit | Attending: Surgery | Admitting: Surgery

## 2017-11-13 ENCOUNTER — Encounter (HOSPITAL_COMMUNITY)
Admission: RE | Disposition: A | Discharge status: Home / Self Care | Payer: Self-pay | Source: Ambulatory Visit | Attending: Surgery

## 2017-11-13 ENCOUNTER — Encounter (HOSPITAL_COMMUNITY): Payer: Self-pay | Admitting: General Practice

## 2017-11-13 DIAGNOSIS — Z888 Allergy status to other drugs, medicaments and biological substances status: Secondary | ICD-10-CM

## 2017-11-13 DIAGNOSIS — Z794 Long term (current) use of insulin: Secondary | ICD-10-CM

## 2017-11-13 DIAGNOSIS — Z79891 Long term (current) use of opiate analgesic: Secondary | ICD-10-CM

## 2017-11-13 DIAGNOSIS — F17210 Nicotine dependence, cigarettes, uncomplicated: Secondary | ICD-10-CM | POA: Diagnosis present

## 2017-11-13 DIAGNOSIS — E1151 Type 2 diabetes mellitus with diabetic peripheral angiopathy without gangrene: Secondary | ICD-10-CM | POA: Diagnosis present

## 2017-11-13 DIAGNOSIS — G8929 Other chronic pain: Secondary | ICD-10-CM | POA: Diagnosis present

## 2017-11-13 DIAGNOSIS — I70721 Atherosclerosis of other type of bypass graft(s) of the extremities with rest pain, right leg: Secondary | ICD-10-CM | POA: Diagnosis present

## 2017-11-13 DIAGNOSIS — Z880 Allergy status to penicillin: Secondary | ICD-10-CM | POA: Diagnosis not present

## 2017-11-13 DIAGNOSIS — I739 Peripheral vascular disease, unspecified: Secondary | ICD-10-CM | POA: Diagnosis present

## 2017-11-13 DIAGNOSIS — K219 Gastro-esophageal reflux disease without esophagitis: Secondary | ICD-10-CM | POA: Diagnosis present

## 2017-11-13 DIAGNOSIS — T82868A Thrombosis of vascular prosthetic devices, implants and grafts, initial encounter: Secondary | ICD-10-CM | POA: Diagnosis present

## 2017-11-13 DIAGNOSIS — I743 Embolism and thrombosis of arteries of the lower extremities: Secondary | ICD-10-CM | POA: Diagnosis present

## 2017-11-13 DIAGNOSIS — Z79899 Other long term (current) drug therapy: Secondary | ICD-10-CM | POA: Diagnosis not present

## 2017-11-13 DIAGNOSIS — Z95820 Peripheral vascular angioplasty status with implants and grafts: Secondary | ICD-10-CM | POA: Diagnosis not present

## 2017-11-13 DIAGNOSIS — Z833 Family history of diabetes mellitus: Secondary | ICD-10-CM | POA: Diagnosis not present

## 2017-11-13 DIAGNOSIS — I70221 Atherosclerosis of native arteries of extremities with rest pain, right leg: Secondary | ICD-10-CM

## 2017-11-13 DIAGNOSIS — I10 Essential (primary) hypertension: Secondary | ICD-10-CM | POA: Diagnosis present

## 2017-11-13 DIAGNOSIS — I998 Other disorder of circulatory system: Secondary | ICD-10-CM

## 2017-11-13 HISTORY — PX: ABDOMINAL AORTOGRAM W/LOWER EXTREMITY: CATH118223

## 2017-11-13 HISTORY — PX: ULTRASOUND GUIDANCE FOR VASCULAR ACCESS: SHX6516

## 2017-11-13 HISTORY — DX: Gastro-esophageal reflux disease without esophagitis: K21.9

## 2017-11-13 HISTORY — DX: Peripheral vascular disease, unspecified: I73.9

## 2017-11-13 LAB — POCT I-STAT, CHEM 8
BUN: 9 mg/dL (ref 6–20)
CREATININE: 0.7 mg/dL (ref 0.61–1.24)
Calcium, Ion: 1.31 mmol/L (ref 1.15–1.40)
Chloride: 99 mmol/L — ABNORMAL LOW (ref 101–111)
Glucose, Bld: 99 mg/dL (ref 65–99)
HEMATOCRIT: 44 % (ref 39.0–52.0)
HEMOGLOBIN: 15 g/dL (ref 13.0–17.0)
POTASSIUM: 4.1 mmol/L (ref 3.5–5.1)
SODIUM: 141 mmol/L (ref 135–145)
TCO2: 31 mmol/L (ref 22–32)

## 2017-11-13 LAB — GLUCOSE, CAPILLARY: GLUCOSE-CAPILLARY: 96 mg/dL (ref 65–99)

## 2017-11-13 SURGERY — ABDOMINAL AORTOGRAM W/LOWER EXTREMITY
Anesthesia: LOCAL

## 2017-11-13 MED ORDER — OXYCODONE HCL 5 MG PO TABS
ORAL_TABLET | ORAL | Status: AC
  Filled 2017-11-13: qty 3

## 2017-11-13 MED ORDER — OXYCODONE HCL 5 MG PO TABS
15.0000 mg | ORAL_TABLET | Freq: Four times a day (QID) | ORAL | Status: DC | PRN
  Administered 2017-11-13 – 2017-11-16 (×10): 15 mg via ORAL
  Filled 2017-11-13 (×9): qty 3

## 2017-11-13 MED ORDER — SODIUM CHLORIDE 0.9 % IV SOLN
INTRAVENOUS | Status: DC
  Administered 2017-11-13: 09:00:00 via INTRAVENOUS

## 2017-11-13 MED ORDER — HEPARIN (PORCINE) IN NACL 2-0.9 UNIT/ML-% IJ SOLN
INTRAMUSCULAR | Status: AC
  Filled 2017-11-13: qty 1000

## 2017-11-13 MED ORDER — SODIUM CHLORIDE 0.9 % IV SOLN: 250.0000 mL | INTRAVENOUS | Status: DC | PRN

## 2017-11-13 MED ORDER — OXYCODONE HCL ER 15 MG PO T12A: 15.0000 mg | EXTENDED_RELEASE_TABLET | Freq: Two times a day (BID) | ORAL | Status: DC

## 2017-11-13 MED ORDER — LIDOCAINE HCL (PF) 1 % IJ SOLN
INTRAMUSCULAR | Status: AC
  Filled 2017-11-13: qty 30

## 2017-11-13 MED ORDER — IODIXANOL 320 MG/ML IV SOLN
INTRAVENOUS | Status: DC | PRN
  Administered 2017-11-13: 145 mL via INTRA_ARTERIAL

## 2017-11-13 MED ORDER — HYDRALAZINE HCL 20 MG/ML IJ SOLN: 5.0000 mg | INTRAMUSCULAR | Status: DC | PRN

## 2017-11-13 MED ORDER — SODIUM CHLORIDE 0.9% FLUSH: 3.0000 mL | INTRAVENOUS | Status: DC | PRN

## 2017-11-13 MED ORDER — ZOLPIDEM TARTRATE 5 MG PO TABS
10.0000 mg | ORAL_TABLET | Freq: Every day | ORAL | Status: DC
  Administered 2017-11-13 – 2017-11-15 (×3): 10 mg via ORAL
  Filled 2017-11-13 (×3): qty 2

## 2017-11-13 MED ORDER — OXYCODONE HCL ER 15 MG PO T12A
15.0000 mg | EXTENDED_RELEASE_TABLET | Freq: Two times a day (BID) | ORAL | Status: DC
  Administered 2017-11-13 – 2017-11-16 (×6): 15 mg via ORAL
  Filled 2017-11-13 (×6): qty 1

## 2017-11-13 MED ORDER — HEPARIN (PORCINE) IN NACL 2-0.9 UNIT/ML-% IJ SOLN
INTRAMUSCULAR | Status: AC | PRN
  Administered 2017-11-13: 1000 mL

## 2017-11-13 MED ORDER — LABETALOL HCL 5 MG/ML IV SOLN: 10.0000 mg | INTRAVENOUS | Status: DC | PRN

## 2017-11-13 MED ORDER — LIDOCAINE HCL (PF) 1 % IJ SOLN
INTRAMUSCULAR | Status: DC | PRN
  Administered 2017-11-13: 25 mL

## 2017-11-13 MED ORDER — ASPIRIN EC 81 MG PO TBEC
81.0000 mg | DELAYED_RELEASE_TABLET | Freq: Every day | ORAL | Status: DC
  Administered 2017-11-14: 81 mg via ORAL
  Filled 2017-11-13 (×3): qty 1

## 2017-11-13 MED ORDER — CEFAZOLIN SODIUM-DEXTROSE 2-4 GM/100ML-% IV SOLN
2.0000 g | INTRAVENOUS | Status: AC
  Administered 2017-11-14: 2 g via INTRAVENOUS
  Filled 2017-11-13 (×2): qty 100

## 2017-11-13 MED ORDER — GABAPENTIN 300 MG PO CAPS
300.0000 mg | ORAL_CAPSULE | Freq: Three times a day (TID) | ORAL | Status: DC
  Administered 2017-11-14 (×2): 300 mg via ORAL
  Filled 2017-11-13 (×4): qty 1

## 2017-11-13 MED ORDER — CEFAZOLIN SODIUM-DEXTROSE 2-4 GM/100ML-% IV SOLN
2.0000 g | INTRAVENOUS | Status: DC
  Filled 2017-11-13: qty 100

## 2017-11-13 MED ORDER — ACETAMINOPHEN 325 MG PO TABS
650.0000 mg | ORAL_TABLET | ORAL | Status: DC | PRN
  Administered 2017-11-14: 650 mg via ORAL
  Filled 2017-11-13: qty 2

## 2017-11-13 MED ORDER — SODIUM CHLORIDE 0.9% FLUSH
3.0000 mL | Freq: Two times a day (BID) | INTRAVENOUS | Status: DC
  Administered 2017-11-13 – 2017-11-16 (×6): 3 mL via INTRAVENOUS

## 2017-11-13 MED ORDER — ONDANSETRON HCL 4 MG/2ML IJ SOLN
4.0000 mg | Freq: Four times a day (QID) | INTRAMUSCULAR | Status: DC | PRN
Start: 2017-11-13 — End: 2017-11-14
  Administered 2017-11-14: 4 mg via INTRAVENOUS

## 2017-11-13 MED ORDER — SODIUM CHLORIDE 0.9 % IV SOLN: INTRAVENOUS | Status: AC

## 2017-11-13 SURGICAL SUPPLY — 10 items
CATH OMNI FLUSH 5F 65CM (CATHETERS) ×3 IMPLANT
COVER PRB 48X5XTLSCP FOLD TPE (BAG) ×2 IMPLANT
COVER PROBE 5X48 (BAG) ×1
KIT MICROINTRODUCER STIFF 5F (SHEATH) ×3 IMPLANT
KIT PV (KITS) ×3 IMPLANT
SHEATH PINNACLE 5F 10CM (SHEATH) ×3 IMPLANT
SYR MEDRAD MARK V 150ML (SYRINGE) ×3 IMPLANT
TRANSDUCER W/STOPCOCK (MISCELLANEOUS) ×3 IMPLANT
TRAY PV CATH (CUSTOM PROCEDURE TRAY) ×3 IMPLANT
WIRE BENTSON .035X145CM (WIRE) ×6 IMPLANT

## 2017-11-13 NOTE — Op Note (Signed)
    Patient name: [name] [name] MRN: [id number] DOB: Sep 15, 1951 Sex: male  11/13/2017 Pre-operative Diagnosis: Bilateral wrist pain Post-operative diagnosis:  Same Surgeon:  [name] Procedure Performed:  1.  Ultrasound-guided access, left femoral artery  2.  Abdominal aortogram  3.  Bilateral lower extremity runoff  4.  Conscious sedation (greater than 15 minutes)     Indications: Conscious sedation was administered with the use of IV fentanyl and Versed under continuous physician and nurse monitoring.  Heart rate, blood pressure, and oxygen saturations were continuously monitored.  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  He came into the office a few days ago with complaints of pain in his right foot as well as changes to his left foot.  He is here today for further evaluation  Procedure:  The patient was identified in the holding area and taken to room 8.  The patient was then placed supine on the table and prepped and draped in the usual sterile fashion.  A time out was called.  Ultrasound was used to evaluate the left common femoral artery.  It was patent .  A digital ultrasound image was acquired.  A micropuncture needle was used to access the left common femoral artery under ultrasound guidance.  An 018 wire was advanced without resistance and a micropuncture sheath was placed.  The 018 wire was removed and a benson wire was placed.  The micropuncture sheath was exchanged for a 5 french sheath.  An omniflush catheter was advanced over the wire to the level of L-1.  An abdominal angiogram was obtained.  Next, bilateral lower extremity runoff was performed  Findings:   Aortogram: No significant renal artery stenosis is identified.  A infrarenal aortic graft is visualized.  The left limb of the graft is patent.  The right limb is occluded.    Right Lower Extremity:  There is very delayed reconstitution of the right profunda  femoral artery.  The superficial femoral artery is occluded.  There is reconstitution of a very diseased popliteal artery and two-vessel runoff via the posterior tibial and peroneal artery.  Visualization is severely limited secondary to proximal disease.  Left Lower Extremity: The right profundofemoral artery is widely patent as is the anastomosis to the left common femoral artery.  The superficial femoral artery is occluded.  There is reconstitution of the peroneal artery which is the dominant runoff.  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  #4  Single-vessel runoff via the peroneal artery on the left   V. [name], M.D. [facility] Office: [phone] Pager:  [phone]

## 2017-11-13 NOTE — Interval H&P Note (Signed)
History and Physical Interval Note:  11/13/2017 9:17 AM  [name] [name]  has presented today for surgery, with the diagnosis of pad  The various methods of treatment have been discussed with the patient and family. After consideration of risks, benefits and other options for treatment, the patient has consented to  Procedure(s): ABDOMINAL AORTOGRAM W/LOWER EXTREMITY (N/A) as a surgical intervention .  The patient's history has been reviewed, patient examined, no change in status, stable for surgery.  I have reviewed the patient's chart and labs.  Questions were answered to the patient's satisfaction.     [name]

## 2017-11-13 NOTE — Progress Notes (Signed)
Site area: Right groin a 7 french arterial sheath was removed  Site Prior to Removal:  Level 0  Pressure Applied For 25 MINUTES    Bedrest Beginning at 1220p  Manual:   Yes.    Patient Status During Pull:  stable  Post Pull Groin Site:  Level 0  Post Pull Instructions Given:  Yes.    Post Pull Pulses Present:  Yes.    Dressing Applied:  Yes.    Comments:  VS remain stable.Left femoral  Pulse bounding.

## 2017-11-14 ENCOUNTER — Inpatient Hospital Stay (HOSPITAL_COMMUNITY): Payer: Medicare Other | Admitting: Certified Registered Nurse Anesthetist

## 2017-11-14 ENCOUNTER — Encounter (HOSPITAL_COMMUNITY)
Admission: RE | Disposition: A | Discharge status: Home / Self Care | Payer: Self-pay | Source: Ambulatory Visit | Attending: Surgery

## 2017-11-14 ENCOUNTER — Encounter (HOSPITAL_COMMUNITY): Payer: Self-pay | Admitting: Surgery

## 2017-11-14 HISTORY — PX: PATCH ANGIOPLASTY: SHX6230

## 2017-11-14 HISTORY — PX: THROMBECTOMY FEMORAL ARTERY: SHX6406

## 2017-11-14 HISTORY — PX: FEMORAL-FEMORAL BYPASS GRAFT: SHX936

## 2017-11-14 LAB — BASIC METABOLIC PANEL
Anion gap: 4 — ABNORMAL LOW (ref 5–15)
BUN: 11 mg/dL (ref 6–20)
CALCIUM: 9.5 mg/dL (ref 8.9–10.3)
CO2: 27 mmol/L (ref 22–32)
CREATININE: 0.64 mg/dL (ref 0.61–1.24)
Chloride: 106 mmol/L (ref 101–111)
GFR calc Af Amer: >60 mL/min (ref >60)
GLUCOSE: 135 mg/dL — AB (ref 65–99)
POTASSIUM: 4 mmol/L (ref 3.5–5.1)
SODIUM: 137 mmol/L (ref 135–145)

## 2017-11-14 LAB — GLUCOSE, CAPILLARY
GLUCOSE-CAPILLARY: 105 mg/dL — AB (ref 65–99)
Glucose-Capillary: 248 mg/dL — ABNORMAL HIGH (ref 65–99)

## 2017-11-14 LAB — CBC
HEMATOCRIT: 40.2 % (ref 39.0–52.0)
Hemoglobin: 13 g/dL (ref 13.0–17.0)
MCH: 29.2 pg (ref 26.0–34.0)
MCHC: 32.3 g/dL (ref 30.0–36.0)
MCV: 90.3 fL (ref 78.0–100.0)
PLATELETS: UNDETERMINED 10*3/uL (ref 150–400)
RBC: 4.45 MIL/uL (ref 4.22–5.81)
RDW: 13.9 % (ref 11.5–15.5)
WBC: 4.4 10*3/uL (ref 4.0–10.5)

## 2017-11-14 LAB — TYPE AND SCREEN
ABO/RH(D): A NEG
Antibody Screen: NEGATIVE

## 2017-11-14 LAB — PROTIME-INR
INR: 1.09
PROTHROMBIN TIME: 14.1 s (ref 11.4–15.2)

## 2017-11-14 SURGERY — CREATION, BYPASS, ARTERIAL, FEMORAL TO FEMORAL, USING GRAFT
Anesthesia: General | Site: Groin | Laterality: Right

## 2017-11-14 MED ORDER — ALUM & MAG HYDROXIDE-SIMETH 200-200-20 MG/5ML PO SUSP: 15.0000 mL | ORAL | Status: DC | PRN

## 2017-11-14 MED ORDER — POLYETHYLENE GLYCOL 3350 17 G PO PACK
17.0000 g | PACK | Freq: Every day | ORAL | Status: DC | PRN
  Administered 2017-11-14: 17 g via ORAL
  Filled 2017-11-14: qty 1

## 2017-11-14 MED ORDER — PHENOL 1.4 % MT LIQD: 1.0000 | OROMUCOSAL | Status: DC | PRN

## 2017-11-14 MED ORDER — PANTOPRAZOLE SODIUM 40 MG PO TBEC
40.0000 mg | DELAYED_RELEASE_TABLET | Freq: Every day | ORAL | Status: DC
  Administered 2017-11-15 – 2017-11-16 (×2): 40 mg via ORAL
  Filled 2017-11-14 (×2): qty 1

## 2017-11-14 MED ORDER — HEPARIN SODIUM (PORCINE) 5000 UNIT/ML IJ SOLN
5000.0000 [IU] | Freq: Three times a day (TID) | INTRAMUSCULAR | Status: DC
  Administered 2017-11-15 (×2): 5000 [IU] via SUBCUTANEOUS
  Filled 2017-11-14 (×3): qty 1

## 2017-11-14 MED ORDER — ONDANSETRON HCL 4 MG/2ML IJ SOLN: 4.0000 mg | Freq: Once | INTRAMUSCULAR | Status: DC | PRN

## 2017-11-14 MED ORDER — DOCUSATE SODIUM 100 MG PO CAPS
100.0000 mg | ORAL_CAPSULE | Freq: Every day | ORAL | Status: DC
Start: 2017-11-15 — End: 2017-11-16
  Administered 2017-11-15 – 2017-11-16 (×2): 100 mg via ORAL
  Filled 2017-11-14 (×2): qty 1

## 2017-11-14 MED ORDER — EPHEDRINE SULFATE-NACL 50-0.9 MG/10ML-% IV SOSY
PREFILLED_SYRINGE | INTRAVENOUS | Status: DC | PRN
  Administered 2017-11-14: 10 mg via INTRAVENOUS

## 2017-11-14 MED ORDER — SUGAMMADEX SODIUM 200 MG/2ML IV SOLN
INTRAVENOUS | Status: DC | PRN
  Administered 2017-11-14: 125 mg via INTRAVENOUS

## 2017-11-14 MED ORDER — ROCURONIUM BROMIDE 100 MG/10ML IV SOLN
INTRAVENOUS | Status: DC | PRN
  Administered 2017-11-14: 10 mg via INTRAVENOUS
  Administered 2017-11-14: 50 mg via INTRAVENOUS
  Administered 2017-11-14: 20 mg via INTRAVENOUS
  Administered 2017-11-14: 10 mg via INTRAVENOUS

## 2017-11-14 MED ORDER — LACTATED RINGERS IV SOLN
INTRAVENOUS | Status: DC
  Administered 2017-11-14 (×2): via INTRAVENOUS

## 2017-11-14 MED ORDER — POTASSIUM CHLORIDE CRYS ER 20 MEQ PO TBCR: 20.0000 meq | EXTENDED_RELEASE_TABLET | Freq: Every day | ORAL | Status: DC | PRN

## 2017-11-14 MED ORDER — LABETALOL HCL 5 MG/ML IV SOLN: 10.0000 mg | INTRAVENOUS | Status: DC | PRN

## 2017-11-14 MED ORDER — LIDOCAINE HCL (CARDIAC) 20 MG/ML IV SOLN
INTRAVENOUS | Status: DC | PRN
  Administered 2017-11-14: 100 mg via INTRAVENOUS

## 2017-11-14 MED ORDER — HYDROMORPHONE HCL 1 MG/ML IJ SOLN
INTRAMUSCULAR | Status: AC
  Filled 2017-11-14: qty 1

## 2017-11-14 MED ORDER — HEMOSTATIC AGENTS (NO CHARGE) OPTIME
TOPICAL | Status: DC | PRN
  Administered 2017-11-14: 2 via TOPICAL

## 2017-11-14 MED ORDER — SODIUM CHLORIDE 0.9 % IV SOLN: 500.0000 mL | Freq: Once | INTRAVENOUS | Status: DC | PRN

## 2017-11-14 MED ORDER — MORPHINE SULFATE (PF) 2 MG/ML IV SOLN: 2.0000 mg | INTRAVENOUS | Status: DC | PRN

## 2017-11-14 MED ORDER — SODIUM CHLORIDE 0.9 % IV SOLN
INTRAVENOUS | Status: DC
  Administered 2017-11-14: 18:00:00 via INTRAVENOUS

## 2017-11-14 MED ORDER — MEPERIDINE HCL 25 MG/ML IJ SOLN: 6.2500 mg | INTRAMUSCULAR | Status: DC | PRN

## 2017-11-14 MED ORDER — DEXAMETHASONE SODIUM PHOSPHATE 10 MG/ML IJ SOLN
INTRAMUSCULAR | Status: DC | PRN
  Administered 2017-11-14: 10 mg via INTRAVENOUS

## 2017-11-14 MED ORDER — PROPOFOL 10 MG/ML IV BOLUS
INTRAVENOUS | Status: DC | PRN
  Administered 2017-11-14: 110 mg via INTRAVENOUS

## 2017-11-14 MED ORDER — CEFUROXIME SODIUM 1.5 G IV SOLR
1.5000 g | Freq: Two times a day (BID) | INTRAVENOUS | Status: AC
  Administered 2017-11-14 – 2017-11-15 (×2): 1.5 g via INTRAVENOUS
  Filled 2017-11-14 (×2): qty 1.5

## 2017-11-14 MED ORDER — DEXTROSE 5 % IV SOLN
INTRAVENOUS | Status: DC | PRN
  Administered 2017-11-14: 20 ug/min via INTRAVENOUS

## 2017-11-14 MED ORDER — HEPARIN SODIUM (PORCINE) 5000 UNIT/ML IJ SOLN
INTRAMUSCULAR | Status: DC | PRN
  Administered 2017-11-14: 500 mL

## 2017-11-14 MED ORDER — FENTANYL CITRATE (PF) 100 MCG/2ML IJ SOLN
50.0000 ug | Freq: Once | INTRAMUSCULAR | Status: AC
  Administered 2017-11-14: 50 ug via INTRAVENOUS

## 2017-11-14 MED ORDER — GUAIFENESIN-DM 100-10 MG/5ML PO SYRP: 15.0000 mL | ORAL_SOLUTION | ORAL | Status: DC | PRN

## 2017-11-14 MED ORDER — LACTATED RINGERS IV SOLN
INTRAVENOUS | Status: DC | PRN
  Administered 2017-11-14: 14:00:00 via INTRAVENOUS

## 2017-11-14 MED ORDER — MIDAZOLAM HCL 5 MG/5ML IJ SOLN
INTRAMUSCULAR | Status: DC | PRN
  Administered 2017-11-14: 2 mg via INTRAVENOUS

## 2017-11-14 MED ORDER — MIDAZOLAM HCL 2 MG/2ML IJ SOLN
INTRAMUSCULAR | Status: AC
  Filled 2017-11-14: qty 2

## 2017-11-14 MED ORDER — FENTANYL CITRATE (PF) 100 MCG/2ML IJ SOLN
INTRAMUSCULAR | Status: AC
  Administered 2017-11-14: 50 ug via INTRAVENOUS
  Filled 2017-11-14: qty 2

## 2017-11-14 MED ORDER — FENTANYL CITRATE (PF) 250 MCG/5ML IJ SOLN
INTRAMUSCULAR | Status: AC
  Filled 2017-11-14: qty 5

## 2017-11-14 MED ORDER — 0.9 % SODIUM CHLORIDE (POUR BTL) OPTIME
TOPICAL | Status: DC | PRN
  Administered 2017-11-14: 2000 mL

## 2017-11-14 MED ORDER — SODIUM CHLORIDE 0.9 % IV SOLN: Freq: Once | INTRAVENOUS | Status: DC

## 2017-11-14 MED ORDER — PROTAMINE SULFATE 10 MG/ML IV SOLN
INTRAVENOUS | Status: DC | PRN
  Administered 2017-11-14: 40 mg via INTRAVENOUS
  Administered 2017-11-14: 10 mg via INTRAVENOUS

## 2017-11-14 MED ORDER — BISACODYL 10 MG RE SUPP: 10.0000 mg | Freq: Every day | RECTAL | Status: DC | PRN

## 2017-11-14 MED ORDER — ONDANSETRON HCL 4 MG/2ML IJ SOLN: 4.0000 mg | Freq: Four times a day (QID) | INTRAMUSCULAR | Status: DC | PRN

## 2017-11-14 MED ORDER — MAGNESIUM SULFATE 2 GM/50ML IV SOLN
2.0000 g | Freq: Every day | INTRAVENOUS | Status: DC | PRN
  Filled 2017-11-14: qty 50

## 2017-11-14 MED ORDER — FENTANYL CITRATE (PF) 100 MCG/2ML IJ SOLN
INTRAMUSCULAR | Status: DC | PRN
  Administered 2017-11-14: 100 ug via INTRAVENOUS
  Administered 2017-11-14: 50 ug via INTRAVENOUS

## 2017-11-14 MED ORDER — HYDROMORPHONE HCL 1 MG/ML IJ SOLN
0.2500 mg | INTRAMUSCULAR | Status: DC | PRN
  Administered 2017-11-14 (×4): 0.5 mg via INTRAVENOUS

## 2017-11-14 MED ORDER — PROPOFOL 10 MG/ML IV BOLUS
INTRAVENOUS | Status: AC
  Filled 2017-11-14: qty 20

## 2017-11-14 MED ORDER — METOPROLOL TARTRATE 5 MG/5ML IV SOLN: 2.0000 mg | INTRAVENOUS | Status: DC | PRN

## 2017-11-14 SURGICAL SUPPLY — 66 items
BANDAGE ESMARK 6X9 LF (GAUZE/BANDAGES/DRESSINGS) IMPLANT
BNDG ESMARK 6X9 LF (GAUZE/BANDAGES/DRESSINGS)
CANISTER SUCT 3000ML PPV (MISCELLANEOUS) ×5 IMPLANT
CANNULA VESSEL 3MM 2 BLNT TIP (CANNULA) ×5 IMPLANT
CATH EMB 3FR 80CM (CATHETERS) ×5 IMPLANT
CATH EMB 5FR 80CM (CATHETERS) ×5 IMPLANT
CATH EMB 6FR 80CM (CATHETERS) ×5 IMPLANT
CLIP VESOCCLUDE MED 24/CT (CLIP) ×5 IMPLANT
CLIP VESOCCLUDE SM WIDE 24/CT (CLIP) ×5 IMPLANT
CUFF TOURNIQUET SINGLE 24IN (TOURNIQUET CUFF) IMPLANT
CUFF TOURNIQUET SINGLE 34IN LL (TOURNIQUET CUFF) IMPLANT
CUFF TOURNIQUET SINGLE 44IN (TOURNIQUET CUFF) IMPLANT
DERMABOND ADVANCED (GAUZE/BANDAGES/DRESSINGS) ×2
DERMABOND ADVANCED .7 DNX12 (GAUZE/BANDAGES/DRESSINGS) ×3 IMPLANT
DRAIN CHANNEL 15F RND FF W/TCR (WOUND CARE) IMPLANT
DRAPE HALF SHEET 40X57 (DRAPES) ×5 IMPLANT
DRAPE X-RAY CASS 24X20 (DRAPES) IMPLANT
ELECT REM PT RETURN 9FT ADLT (ELECTROSURGICAL) ×5
ELECTRODE REM PT RTRN 9FT ADLT (ELECTROSURGICAL) ×3 IMPLANT
EVACUATOR SILICONE 100CC (DRAIN) IMPLANT
GLOVE BIO SURGEON STRL SZ 6.5 (GLOVE) ×16 IMPLANT
GLOVE BIO SURGEONS STRL SZ 6.5 (GLOVE) ×4
GLOVE BIOGEL PI IND STRL 6.5 (GLOVE) ×12 IMPLANT
GLOVE BIOGEL PI IND STRL 7.5 (GLOVE) ×3 IMPLANT
GLOVE BIOGEL PI INDICATOR 6.5 (GLOVE) ×8
GLOVE BIOGEL PI INDICATOR 7.5 (GLOVE) ×2
GLOVE SURG SS PI 7.5 STRL IVOR (GLOVE) ×10 IMPLANT
GOWN STRL REUS W/ TWL LRG LVL3 (GOWN DISPOSABLE) ×9 IMPLANT
GOWN STRL REUS W/ TWL XL LVL3 (GOWN DISPOSABLE) ×3 IMPLANT
GOWN STRL REUS W/TWL LRG LVL3 (GOWN DISPOSABLE) ×6
GOWN STRL REUS W/TWL XL LVL3 (GOWN DISPOSABLE) ×2
HEMOSTAT SNOW SURGICEL 2X4 (HEMOSTASIS) ×10 IMPLANT
INSERT FOGARTY SM (MISCELLANEOUS) ×5 IMPLANT
KIT BASIN OR (CUSTOM PROCEDURE TRAY) ×5 IMPLANT
KIT ROOM TURNOVER OR (KITS) ×5 IMPLANT
MARKER GRAFT CORONARY BYPASS (MISCELLANEOUS) IMPLANT
NS IRRIG 1000ML POUR BTL (IV SOLUTION) ×10 IMPLANT
PACK PERIPHERAL VASCULAR (CUSTOM PROCEDURE TRAY) ×5 IMPLANT
PAD ARMBOARD 7.5X6 YLW CONV (MISCELLANEOUS) ×10 IMPLANT
PATCH HEMASHIELD 8X75 (Vascular Products) ×5 IMPLANT
SET COLLECT BLD 21X3/4 12 (NEEDLE) IMPLANT
STOPCOCK 4 WAY LG BORE MALE ST (IV SETS) IMPLANT
SUT ETHILON 3 0 PS 1 (SUTURE) IMPLANT
SUT GORETEX 6.0 TT13 (SUTURE) IMPLANT
SUT GORETEX 6.0 TT9 (SUTURE) IMPLANT
SUT PROLENE 4 0 RB 1 (SUTURE) ×4
SUT PROLENE 4-0 RB1 .5 CRCL 36 (SUTURE) ×6 IMPLANT
SUT PROLENE 5 0 C 1 24 (SUTURE) ×30 IMPLANT
SUT PROLENE 6 0 BV (SUTURE) ×20 IMPLANT
SUT PROLENE 7 0 BV 1 (SUTURE) IMPLANT
SUT PROLENE 7 0 BV1 MDA (SUTURE) ×5 IMPLANT
SUT SILK 2 0 SH (SUTURE) IMPLANT
SUT SILK 3 0 (SUTURE)
SUT SILK 3-0 18XBRD TIE 12 (SUTURE) IMPLANT
SUT VIC AB 2-0 CT1 27 (SUTURE) ×4
SUT VIC AB 2-0 CT1 TAPERPNT 27 (SUTURE) ×6 IMPLANT
SUT VIC AB 3-0 SH 27 (SUTURE) ×4
SUT VIC AB 3-0 SH 27X BRD (SUTURE) ×6 IMPLANT
SUT VICRYL 4-0 PS2 18IN ABS (SUTURE) ×10 IMPLANT
SYR 3ML LL SCALE MARK (SYRINGE) ×10 IMPLANT
TAPE UMBILICAL COTTON 1/8X30 (MISCELLANEOUS) ×5 IMPLANT
TOWEL GREEN STERILE (TOWEL DISPOSABLE) ×10 IMPLANT
TRAY FOLEY W/METER SILVER 16FR (SET/KITS/TRAYS/PACK) ×5 IMPLANT
TUBING EXTENTION W/L.L. (IV SETS) IMPLANT
UNDERPAD 30X30 (UNDERPADS AND DIAPERS) ×5 IMPLANT
WATER STERILE IRR 1000ML POUR (IV SOLUTION) ×5 IMPLANT

## 2017-11-14 NOTE — Anesthesia Preprocedure Evaluation (Signed)
Anesthesia Evaluation  Patient identified by MRN, date of birth, ID band Patient awake    Reviewed: Allergy & Precautions, NPO status , Patient's Chart, lab work & pertinent test results  Airway Mallampati: I  TM Distance: >3 FB Neck ROM: Full    Dental   Pulmonary former smoker,    Pulmonary exam normal        Cardiovascular hypertension, Pt. on medications Normal cardiovascular exam     Neuro/Psych    GI/Hepatic GERD  Medicated and Controlled,  Endo/Other  diabetes, Type 2, Oral Hypoglycemic Agents  Renal/GU      Musculoskeletal   Abdominal   Peds  Hematology   Anesthesia Other Findings   Reproductive/Obstetrics                             Anesthesia Physical Anesthesia Plan  ASA: III  Anesthesia Plan: General   Post-op Pain Management:    Induction: Intravenous  PONV Risk Score and Plan: 2 and Ondansetron, Midazolam and Treatment may vary due to age or medical condition  Airway Management Planned: Oral ETT  Additional Equipment:   Intra-op Plan:   Post-operative Plan: Extubation in OR  Informed Consent: I have reviewed the patients History and Physical, chart, labs and discussed the procedure including the risks, benefits and alternatives for the proposed anesthesia with the patient or authorized representative who has indicated his/her understanding and acceptance.     Plan Discussed with: CRNA and Surgeon  Anesthesia Plan Comments:         Anesthesia Quick Evaluation

## 2017-11-14 NOTE — Transfer of Care (Signed)
Immediate Anesthesia Transfer of Care Note  Patient: FARRON WATROUS  Procedure(s) Performed: REDO BYPASS  FEMORAL-FEMORAL ARTERY EXPOSURE (Right Groin) THROMBECTOMY AORTA  BI-FEMORAL (N/A Groin) PATCH ANGIOPLASTY COMMON FEMROAL AND PROFUNDA (N/A Groin)  Patient Location: PACU  Anesthesia Type:General  Level of Consciousness: awake, alert , oriented and patient cooperative  Airway & Oxygen Therapy: Patient Spontanous Breathing and Patient connected to nasal cannula oxygen  Post-op Assessment: Report given to RN and Post -op Vital signs reviewed and stable  Post vital signs: Reviewed and stable  Last Vitals:  Vitals:   11/14/17 0308 11/14/17 0828  BP: 132/80 (!) 147/82  Pulse: (!) 51 (!) 53  Resp: 11 18  Temp: 37.1 C 36.9 C  SpO2: 98% 98%    Last Pain:  Vitals:   11/14/17 0926  TempSrc:   PainSc: Asleep      Patients Stated Pain Goal: 2 (47/42/59 5638)  Complications: No apparent anesthesia complications

## 2017-11-14 NOTE — H&P (Signed)
Patient name: [name] [name] MRN: [id number] DOB: 1950-12-05 Sex: male    HISTORY OF PRESENT ILLNESS:   [name] [name] is a 66 y.o. male who underwent angio yesterday revealing an occluded right limb of his ABF.  He is having rest pain  CURRENT MEDICATIONS:    Current Facility-Administered Medications  Medication Dose Route Frequency Provider Last Rate Last Dose  . [MAR Hold] 0.9 %  sodium chloride infusion  250 mL Intravenous PRN Serafina Mitchell, MD      . 0.9 %  sodium chloride infusion   Intravenous Once Harder, Rebeca Alert, CRNA      . [MAR Hold] acetaminophen (TYLENOL) tablet 650 mg  650 mg Oral Q4H PRN Serafina Mitchell, MD   650 mg at 11/14/17 0200  . [MAR Hold] aspirin EC tablet 81 mg  81 mg Oral Daily Serafina Mitchell, MD   81 mg at 11/14/17 0901  . [MAR Hold] ceFAZolin (ANCEF) IVPB 2g/100 mL premix  2 g Intravenous To SS-Surg Serafina Mitchell, MD      . Doug Sou Hold] gabapentin (NEURONTIN) capsule 300 mg  300 mg Oral TID Serafina Mitchell, MD   300 mg at 11/14/17 0901  . [MAR Hold] hydrALAZINE (APRESOLINE) injection 5 mg  5 mg Intravenous Q20 Min PRN Serafina Mitchell, MD      . Doug Sou Hold] labetalol (NORMODYNE,TRANDATE) injection 10 mg  10 mg Intravenous Q10 min PRN Serafina Mitchell, MD      . lactated ringers infusion   Intravenous Continuous Lillia Abed, MD 10 mL/hr at 11/14/17 1102    . [MAR Hold] ondansetron (ZOFRAN) injection 4 mg  4 mg Intravenous Q6H PRN Serafina Mitchell, MD      . Doug Sou Hold] oxyCODONE (Oxy IR/ROXICODONE) immediate release tablet 15 mg  15 mg Oral Q6H PRN Serafina Mitchell, MD   15 mg at 11/14/17 0404  . [MAR Hold] oxyCODONE (OXYCONTIN) 12 hr tablet 15 mg  15 mg Oral Q12H Serafina Mitchell, MD   15 mg at 11/14/17 0616  . [MAR Hold] sodium chloride flush (NS) 0.9 % injection 3 mL  3 mL Intravenous Q12H Serafina Mitchell, MD   3 mL at 11/14/17 0934  . [MAR Hold] sodium chloride flush (NS) 0.9 % injection 3 mL  3 mL Intravenous PRN  Serafina Mitchell, MD      . Doug Sou Hold] zolpidem (AMBIEN) tablet 10 mg  10 mg Oral QHS Serafina Mitchell, MD   10 mg at 11/13/17 2221    REVIEW OF SYSTEMS:   [X]  denotes positive finding, [ ]  denotes negative finding Cardiac  Comments:  Chest pain or chest pressure:    Shortness of breath upon exertion:    Short of breath when lying flat:    Irregular heart rhythm:    Constitutional    Fever or chills:      PHYSICAL EXAM:   Vitals:   11/13/17 2358 11/14/17 0308 11/14/17 0828 11/14/17 1057  BP: 131/74 132/80 (!) 147/82   Pulse: 61 (!) 51 (!) 53   Resp: 18 11 18    Temp: 98.7 F (37.1 C) 98.8 F (37.1 C) 98.5 F (36.9 C)   TempSrc: Oral Oral Oral   SpO2: 95% 98% 98%   Weight:    138 lb (62.6 kg)  Height:    6\' 3"  (1.905 m)    GENERAL: The patient is a well-nourished male, in no acute distress. The vital signs are documented  above. CARDIOVASCULAR: There is a regular rate and rhythm. PULMONARY: Non-labored respirations No pedla pulses  STUDIES:   none   MEDICAL ISSUES:   Discussed proceeding with thrombectomy of the right limb of his ABF vs left to right fem-fem bypass and possible right fem pop.  Pt agrees and understands risks  [name], MD [facility] [phone] Pager [phone]

## 2017-11-14 NOTE — Op Note (Signed)
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  The superficial femoral artery had been disconnected previously.  There were 2 large profunda branches that the right limb of the aortobifem graft was connected to.  The more lateral of the  2 profundofemoral arteries was chronically occluded.  I opened the hood of the aortic graft down onto the one remaining patent profundofemoral artery.  All neointima and chronic thrombus was evacuated.  Dacryon patch angioplasty was performed going onto the profundofemoral artery approximately 1 cm and then back up onto the right limb of the aortic graft.  Indications: The patient presented to the office this week with rest pain in the right leg.  He also had pain in the left leg.  He underwent angiography yesterday which showed occlusion of the right limb of his aortic graft.  He is here today for revascularization.  Procedure:  The patient was identified in the holding area and taken to [place] [address]  The patient was then placed supine on the table. general anesthesia was administered.  The patient was prepped and draped in the usual sterile fashion.  A time out was called and antibiotics were administered.  The patient's previous right longitudinal groin incision was opened with a 10 blade.  Cautery was used to divide subcutaneous tissue.  Using sharp and cautery dissection, I isolated  the right limb of the aortobifemoral graft which had previously been sewn to the distal common femoral artery and a end to end fashion.  I did identify the superficial femoral artery which had previously been ligated and disconnected.  The patient had 2 large approximately 4-5 mm profunda branches that were each individually isolated.  The lateral of the 2 branches was heavily calcified.  Once I had full exposure, the patient was fully heparinized.  I occluded the vessels and then using #11 blade to make a longitudinal graftotomy on the anterior surface of the aortobifemoral limb.  There was soft thrombus which appeared to be somewhat acute present.  I evacuated all of the thrombus.  I tried to find a lumen in the lateral of the 2 profunda branches and there was no lumen.  I then used a #11 blade to make an arteriotomy in the lateral profunda branch and this was chronically occluded.  I then proceeded to extend the graftotomy onto the profundofemoral artery that had a patent lumen.  I then passed a #3 Fogarty catheter down this went approximately 35 cm.  This was done multiple times until all thrombus was evacuated and there was excellent backbleeding.  This was then flushed with heparin saline and reoccluded.  I then used a #5 and #6 Foley catheter and advanced this up into the aortic limb of the graft.  I perform thrombectomy removing a large volume thrombus and eventually establishing excellent inflow.  Several negative passes were performed.  Once I was satisfied with the thrombectomy, I selected a dacryon patch and perform patch angioplasty with the  patch going out onto the profundofemoral artery approximately 1 cm.  Patch angioplasty was then completed incorporating the right limb of the aortic graft.  Prior to completion the appropriate flushing maneuvers were performed and the anastomosis was completed.  At this point, the patient had an excellent Doppler signal in the 1 remaining profundofemoral artery.  I  then reversed the heparin with protamine.  Once I was satisfied with hemostasis, the deep tissue was reapproximated with 2 layers of 2-0 Vicryl.  The subcutaneous tissue was reapproximated with 3-0 Vicryl and the skin was closed with 4-0 Vicryl followed by Dermabond.  Patient was successfully extubated and taken to the recovery room in stable condition   Disposition: To PACU stable   V. [name], M.D. [facility] Office: [phone] Pager:  [phone]

## 2017-11-14 NOTE — Discharge Instructions (Signed)
° °[facility] ° °Discharge instructions ° °Lower Extremity Surgery ° °Please refer to the following instruction for your post-procedure care. Your surgeon or physician assistant will discuss any changes with you. ° °Activity ° °You are encouraged to walk as much as you can. You can slowly return to normal activities during the month after your surgery. Avoid strenuous activity and heavy lifting until your doctor tells you it's OK. Avoid activities such as vacuuming or swinging a golf club. Do not drive until your doctor give the OK and you are no longer taking prescription pain medications. It is also normal to have difficulty with sleep habits, eating and bowel movement after surgery. These will go away with time. ° °Bathing/Showering ° °You may shower after you go home. Do not soak in a bathtub, hot tub, or swim until the incision heals completely. ° °Incision Care ° °Clean your incision with mild soap and water. Shower every day. Pat the area dry with a clean towel. You do not need a bandage unless otherwise instructed. Do not apply any ointments or creams to your incision. If you have open wounds you will be instructed how to care for them or a visiting nurse may be arranged for you. If you have staples or sutures along your incision they will be removed at your post-op appointment. You may have skin glue on your incision. Do not peel it off. It will come off on its own in about one week. ° °Wash the groin wound with soap and water daily and pat dry. (No tub bath-only shower)  Then put a dry gauze or washcloth in the groin to keep this area dry to help prevent wound infection.  Do this daily and as needed.  Do not use Vaseline or neosporin on your incisions.  Only use soap and water on your incisions and then protect and keep dry. ° °Diet ° °Resume your normal diet. There are no special food restrictions following this procedure. A low fat/ low cholesterol diet is recommended  for all patients with vascular disease. In order to heal from your surgery, it is CRITICAL to get adequate nutrition. Your body requires vitamins, minerals, and protein. Vegetables are the best source of vitamins and minerals. Vegetables also provide the perfect balance of protein. Processed food has little nutritional value, so try to avoid this. ° °Medications ° °Resume taking all your medications unless your doctor or physician assistant tells you not to. If your incision is causing pain, you may take over-the-counter pain relievers such as acetaminophen (Tylenol). If you were prescribed a stronger pain medication, please aware these medication can cause nausea and constipation. Prevent nausea by taking the medication with a snack or meal. Avoid constipation by drinking plenty of fluids and eating foods with high amount of fiber, such as fruits, vegetables, and grains. Take Colace 100 mg (an over-the-counter stool softener) twice a day as needed for constipation. Do not take Tylenol if you are taking prescription pain medications. ° °Follow Up ° °Our office will schedule a follow up appointment 2-3 weeks following discharge. ° °Please call us immediately for any of the following conditions ° °•Severe or worsening pain in your legs or feet while at rest or while walking •Increase pain, redness, warmth, or drainage (pus) from your incision site(s) °Fever of 101 degree or higher °The swelling in your leg with the bypass suddenly worsens and becomes more painful than when you were in the hospital °If you have been   instructed to feel your graft pulse then you should do so every day. If you can no longer feel this pulse, call the office immediately. Not all patients are given this instruction.  Leg swelling is common after leg bypass surgery.  The swelling should improve over a few months following surgery. To improve the swelling, you may elevate your legs above the level of your heart while you are sitting or  resting. Your surgeon or physician assistant may ask you to apply an ACE wrap or wear compression (TED) stockings to help to reduce swelling.  Reduce your risk of vascular disease  Stop smoking. If you would like help call [facility] at 1-800-QUIT-NOW (1-800-784-8669) or [place] at 336-586-4000.  Manage your cholesterol Maintain a desired weight Control your diabetes weight Control your diabetes Keep your blood pressure down  If you have any questions, please call the office at [phone] 

## 2017-11-14 NOTE — Anesthesia Procedure Notes (Signed)
Procedure Name: Intubation Date/Time: 11/14/2017 1:31 PM Performed by: Candis Shine, CRNA Pre-anesthesia Checklist: Patient identified, Emergency Drugs available, Suction available and Patient being monitored Patient Re-evaluated:Patient Re-evaluated prior to induction Oxygen Delivery Method: Circle System Utilized Preoxygenation: Pre-oxygenation with 100% oxygen Induction Type: IV induction Ventilation: Mask ventilation without difficulty Laryngoscope Size: Mac and 4 Grade View: Grade I Tube type: Oral Tube size: 7.5 mm Number of attempts: 1 Airway Equipment and Method: Stylet Placement Confirmation: ETT inserted through vocal cords under direct vision,  positive ETCO2 and breath sounds checked- equal and bilateral Secured at: 23 cm Tube secured with: Tape Dental Injury: Teeth and Oropharynx as per pre-operative assessment

## 2017-11-15 LAB — GLUCOSE, CAPILLARY
GLUCOSE-CAPILLARY: 217 mg/dL — AB (ref 65–99)
GLUCOSE-CAPILLARY: 170 mg/dL — AB (ref 65–99)
Glucose-Capillary: 156 mg/dL — ABNORMAL HIGH (ref 65–99)
Glucose-Capillary: 141 mg/dL — ABNORMAL HIGH (ref 65–99)

## 2017-11-15 LAB — BASIC METABOLIC PANEL
Anion gap: 7 (ref 5–15)
BUN: 14 mg/dL (ref 6–20)
CALCIUM: 9.4 mg/dL (ref 8.9–10.3)
CO2: 28 mmol/L (ref 22–32)
CREATININE: 0.86 mg/dL (ref 0.61–1.24)
Chloride: 104 mmol/L (ref 101–111)
GFR calc non Af Amer: >60 mL/min (ref >60)
Glucose, Bld: 249 mg/dL — ABNORMAL HIGH (ref 65–99)
Potassium: 4.4 mmol/L (ref 3.5–5.1)
SODIUM: 139 mmol/L (ref 135–145)

## 2017-11-15 LAB — CBC
HCT: 39 % (ref 39.0–52.0)
Hemoglobin: 12.5 g/dL — ABNORMAL LOW (ref 13.0–17.0)
MCH: 29.4 pg (ref 26.0–34.0)
MCHC: 32.1 g/dL (ref 30.0–36.0)
MCV: 91.8 fL (ref 78.0–100.0)
Platelets: UNDETERMINED 10*3/uL (ref 150–400)
RBC: 4.25 MIL/uL (ref 4.22–5.81)
RDW: 14 % (ref 11.5–15.5)
WBC: 6.3 10*3/uL (ref 4.0–10.5)

## 2017-11-15 NOTE — Evaluation (Signed)
Physical Therapy Evaluation Patient Details Name: [name] [name] MRN: [id number] DOB: 07-29-1951 Today's Date: 11/15/2017   History of Present Illness  Pt is a 66 y/o male s/p status post thrombectomy of right limb of aortobifemoral graft with Dacron patch angioplasty. PMH including but not limited to DM, HTN and is a smoker.    Clinical Impression  Pt presented sitting in recliner chair, awake and willing to participate in therapy session. Prior to admission, pt reported that he ambulated with use of SPC and was independent with ADLs. Pt ambulated in hallway with use of RW and supervision for safety. Pt very steady with use of RW. PT will continue to follow pt acutely to ensure a safe d/c home.   Follow Up Recommendations No PT follow up    Equipment Recommendations  None recommended by PT    Recommendations for Other Services       Precautions / Restrictions Precautions Precautions: Fall Restrictions Weight Bearing Restrictions: No      Mobility  Bed Mobility               General bed mobility comments: pt OOB in recliner chair upon arrival  Transfers Overall transfer level: Modified independent Equipment used: None                Ambulation/Gait Ambulation/Gait assistance: Supervision Ambulation Distance (Feet): 200 Feet Assistive device: Rolling walker (2 wheeled) Gait Pattern/deviations: Step-through pattern;Decreased stride length Gait velocity: decreased Gait velocity interpretation: Below normal speed for age/gender General Gait Details: no instability or LOB with use of RW, supervision for safety  Stairs            Wheelchair Mobility    Modified Rankin (Stroke Patients Only)       Balance Overall balance assessment: Needs assistance Sitting-balance support: Feet supported Sitting balance-Leahy Scale: Normal     Standing balance support: During functional activity;No upper extremity supported Standing balance-Leahy Scale:  Fair                               Pertinent Vitals/Pain Pain Assessment: Faces Faces Pain Scale: Hurts a little bit Pain Location: R LE, incision site Pain Descriptors / Indicators: Guarding;Sore Pain Intervention(s): Monitored during session    Home Living Family/patient expects to be discharged to:: Private residence Living Arrangements: Alone Available Help at Discharge: Family;Friend(s);Available PRN/intermittently Type of Home: House Home Access: Stairs to enter   Entrance Stairs-Number of Steps: 2 Home Layout: One level Home Equipment: Walker - 4 wheels;Cane - single point      Prior Function Level of Independence: Independent with assistive device(s)         Comments: pt ambulates with use of SPC     Hand Dominance        Extremity/Trunk Assessment   Upper Extremity Assessment Upper Extremity Assessment: Overall WFL for tasks assessed    Lower Extremity Assessment Lower Extremity Assessment: Overall WFL for tasks assessed;RLE deficits/detail RLE Deficits / Details: sensation to light touch diminished as compared to L LE       Communication   Communication: No difficulties  Cognition Arousal/Alertness: Awake/alert Behavior During Therapy: WFL for tasks assessed/performed Overall Cognitive Status: Within Functional Limits for tasks assessed                                        General  Comments      Exercises     Assessment/Plan    PT Assessment Patient needs continued PT services  PT Problem List Decreased balance;Decreased mobility;Decreased coordination       PT Treatment Interventions DME instruction;Gait training;Stair training;Functional mobility training;Therapeutic activities;Therapeutic exercise;Balance training;Neuromuscular re-education;Patient/family education    PT Goals (Current goals can be found in the Care Plan section)  Acute Rehab PT Goals Patient Stated Goal: return home today PT Goal  Formulation: With patient Time For Goal Achievement: 11/29/17 Potential to Achieve Goals: Good    Frequency Min 3X/week   Barriers to discharge        Co-evaluation               AM-PAC PT "6 Clicks" Daily Activity  Outcome Measure Difficulty turning over in bed (including adjusting bedclothes, sheets and blankets)?: None Difficulty moving from lying on back to sitting on the side of the bed? : None Difficulty sitting down on and standing up from a chair with arms (e.g., wheelchair, bedside commode, etc,.)?: A Little Help needed moving to and from a bed to chair (including a wheelchair)?: None Help needed walking in hospital room?: None Help needed climbing 3-5 steps with a railing? : A Little 6 Click Score: 22    End of Session Equipment Utilized During Treatment: Gait belt Activity Tolerance: Patient tolerated treatment well Patient left: in chair;with call bell/phone within reach;with family/visitor present Nurse Communication: Mobility status PT Visit Diagnosis: Other abnormalities of gait and mobility (R26.89)    Time: 6659-9357 PT Time Calculation (min) (ACUTE ONLY): 12 min   Charges:   PT Evaluation $PT Eval Moderate Complexity: 1 Mod     PT G Codes:        Attu [place], PT, DPT [place] 11/15/2017, 12:25 PM

## 2017-11-15 NOTE — Anesthesia Postprocedure Evaluation (Signed)
Anesthesia Post Note  Patient: [name] [name]  Procedure(s) Performed: REDO BYPASS  FEMORAL-FEMORAL ARTERY EXPOSURE (Right Groin) THROMBECTOMY AORTA  BI-FEMORAL (N/A Groin) PATCH ANGIOPLASTY COMMON FEMROAL AND PROFUNDA (N/A Groin)     Patient location during evaluation: PACU Anesthesia Type: General Level of consciousness: awake and alert Pain management: pain level controlled Vital Signs Assessment: post-procedure vital signs reviewed and stable Respiratory status: spontaneous breathing, nonlabored ventilation, respiratory function stable and patient connected to nasal cannula oxygen Cardiovascular status: blood pressure returned to baseline and stable Postop Assessment: no apparent nausea or vomiting Anesthetic complications: no    Last Vitals:  Vitals:   11/15/17 0000 11/15/17 0335  BP: 111/72 106/72  Pulse: 66 71  Resp: 15 11  Temp:  36.8 C  SpO2:  96%    Last Pain:  Vitals:   11/15/17 0436  TempSrc:   PainSc: 4                  Verlisa Vara DAVID

## 2017-11-15 NOTE — Progress Notes (Signed)
    Subjective  - POD #1, status post thrombectomy of right limb of aortobifemoral graft with Dacron patch angioplasty  Patient states that his right leg feels dramatically better. He is concerned about some discoloration on his left leg but his left leg does not bother him as much as it did before the operation   Physical Exam:  Venous appearing signals obtained in the right foot, which were not present preoperative the. His incision is healing nicely.       Assessment/Plan:  POD #1  Patient will attempt ambulation today.  Once his pain control is adequate he can be discharged home.  No intervention is recommended on his left leg at this time.  Statin is not indicated due to allergy.  PT and OT evaluation  [name] [name] 11/15/2017 11:08 AM --  Vitals:   11/15/17 0335 11/15/17 0827  BP: 106/72 124/75  Pulse: 71 68  Resp: 11 (!) 21  Temp: 98.3 F (36.8 C)   SpO2: 96% 100%    Intake/Output Summary (Last 24 hours) at 11/15/2017 1108 Last data filed at 11/15/2017 0530 Gross per 24 hour  Intake 2160 ml  Output 1725 ml  Net 435 ml     Laboratory CBC    Component Value Date/Time   WBC 6.3 11/15/2017 0306   HGB 12.5 (L) 11/15/2017 0306   HGB 12.5 (L) 07/28/2009 1533   HCT 39.0 11/15/2017 0306   HCT 37.1 (L) 07/28/2009 1533   PLT PLATELET CLUMPS NOTED ON SMEAR, UNABLE TO ESTIMATE 11/15/2017 0306   PLT 145 07/28/2009 1533    BMET    Component Value Date/Time   NA 139 11/15/2017 0306   K 4.4 11/15/2017 0306   CL 104 11/15/2017 0306   CO2 28 11/15/2017 0306   GLUCOSE 249 (H) 11/15/2017 0306   BUN 14 11/15/2017 0306   CREATININE 0.86 11/15/2017 0306   CALCIUM 9.4 11/15/2017 0306   GFRNONAA >60 11/15/2017 0306   GFRAA >60 11/15/2017 0306    COAG Lab Results  Component Value Date   INR 1.09 11/14/2017   INR 1.28 12/27/2015   INR 1.82 (H) 12/08/2015   No results found for: PTT  Antibiotics Anti-infectives (From admission, onward)   Start      Dose/Rate Route Frequency Ordered Stop   11/14/17 2100  cefUROXime (ZINACEF) 1.5 g in dextrose 5 % 50 mL IVPB     1.5 g 100 mL/hr over 30 Minutes Intravenous Every 12 hours 11/14/17 1820 11/15/17 0918   11/14/17 0500  ceFAZolin (ANCEF) IVPB 2g/100 mL premix    Comments:  Send with pt to OR   2 g 200 mL/hr over 30 Minutes Intravenous To ShortStay Surgical 11/13/17 1650 11/14/17 1340   11/13/17 1115  ceFAZolin (ANCEF) IVPB 2g/100 mL premix  Status:  Discontinued    Comments:  Send with pt to OR   2 g 200 mL/hr over 30 Minutes Intravenous To ShortStay Surgical 11/13/17 1100 11/13/17 1650       [name] [name], M.D. [facility] Office: [phone] Pager:  [phone]

## 2017-11-16 LAB — GLUCOSE, CAPILLARY
GLUCOSE-CAPILLARY: 123 mg/dL — AB (ref 65–99)
GLUCOSE-CAPILLARY: 139 mg/dL — AB (ref 65–99)

## 2017-11-16 NOTE — Progress Notes (Signed)
OT Cancellation Note and Discharge  Patient Details Name: SAFIR MICHALEC MRN: [id number] DOB: 21-Mar-1951   Cancelled Treatment:    Reason Eval/Treat Not Completed: OT screened, no needs identified, will sign off. Pt S 200 feet with PT yesterday. In to speak with pt and he report he is not having any issues with LBB/D, getting to toilet and does not foresee any issues with showering/getting into-out of tub/shower.   Almon Register [phone] 11/16/2017, 12:59 PM

## 2017-11-17 ENCOUNTER — Encounter (HOSPITAL_COMMUNITY): Payer: Self-pay | Admitting: Surgery

## 2017-11-17 NOTE — Discharge Summary (Signed)
Physician Discharge Summary  Patient ID: [name] [name] MRN: [id number] DOB/AGE: [birth year] 65 y.o.  Admit date: 11/13/2017 Discharge date: 11/17/2017  Admission Diagnoses:  Discharge Diagnoses:  Active Problems:   PAD (peripheral artery disease) ([place])   Discharged Condition: good  Hospital Course: Patient has a history of an aortobifemoral bypass graft and left femoral-popliteal bypass graft by Dr. early.  He presented to the office on 11/10/2017 with severe pain in his right leg.  He underwent angiography on 11/13/2017 and was found to have occlusion of the right limb of his aortobifemoral bypass graft as well as his left femoral-popliteal bypass graft.  On 11/14/2017 he underwent thrombectomy of the right limb of the bypass graft and patch angioplasty of the common femoral and profunda femoral artery.  On postoperative day 1, his symptoms had significantly improved in his right leg.  On postoperative day 2 he was ambulatory and without significant pain and therefore discharged.  He did clear physical therapy prior to discharge.  Consults: None      Discharge Exam: Blood pressure 127/80, pulse (!) 59, temperature 98.7 F (37.1 C), temperature source Oral, resp. rate 14, height 6\' 3"  (1.905 m), weight 138 lb (62.6 kg), SpO2 97 %. Both feet are warm and well perfused with monophasic Doppler signals.  His right groin incision is healing nicely.  No wounds in the legs are noted.  Disposition: 01-Home or Self Care   Allergies as of 11/16/2017      Reactions   Zetia [ezetimibe] Anaphylaxis, Swelling   Tongue and throat  Tongue and throat  Tongue and throat    Pravastatin Sodium Other (See Comments)   Pravastatin 40 mg qday and 40 mg q M/W/F caused muscle aches Pravastatin 40 mg qday and 40 mg q M/W/F caused muscle aches Pravastatin 40 mg qday and 40 mg q M/W/F caused muscle aches   Atorvastatin Other (See Comments), Rash   Malaise & muscle weakness   Crestor [rosuvastatin]  Other (See Comments), Rash   Malaise & muscle weakness   Lisinopril Rash   Promethazine Rash      Medication List    TAKE these medications   gabapentin 300 MG capsule Commonly known as:  NEURONTIN Take 300 mg by mouth 3 (three) times daily.   OXYCONTIN 15 mg 12 hr tablet Generic drug:  oxyCODONE Take 15 mg by mouth every 12 (twelve) hours.   oxyCODONE 15 MG immediate release tablet Commonly known as:  ROXICODONE Take 15 mg by mouth every 6 (six) hours as needed for pain. Notes to patient:  Last dose 8 AM 11/16/17   zolpidem 10 MG tablet Commonly known as:  AMBIEN Take 10 mg by mouth at bedtime.      Follow-up Information    Serafina Mitchell, MD In 2 weeks.   Specialties:  Vascular Surgery, Cardiology Why:  Office will call you to arrange your appt (sent) Contact information: [address] [place] [address] [phone]           Signed: Wells Oral Remache 11/17/2017, 2:57 PM

## 2017-11-20 ENCOUNTER — Telehealth: Payer: Self-pay | Admitting: Surgery

## 2017-11-20 NOTE — Telephone Encounter (Signed)
-----   Message from Mena Goes, RN sent at 11/15/2017 11:48 AM EST ----- Regarding: 2 weeks postop   ----- Message ----- From: Gabriel Earing, PA-C Sent: 11/14/2017   4:33 PM To: Vvs Charge Pool  S/p right thrombectomy of CFA and patch angioplasty CFA/profunda. F/u with Dr. Trula Slade in 2 weeks.  Thanks

## 2017-11-20 NOTE — Telephone Encounter (Signed)
Spoke to pt for appt 1/14 mailed lttr 11/20/17 bg

## 2017-11-27 NOTE — Progress Notes (Signed)
PT Progress Note Addendum for G-Codes    12-15-2017 1205  PT G-Codes **NOT FOR INPATIENT CLASS**  Functional Assessment Tool Used AM-PAC 6 Clicks Basic Mobility;Clinical judgement  Functional Limitation Mobility: Walking and moving around  Mobility: Walking and Moving Around Current Status [phone]) CJ  Mobility: Walking and Moving Around Goal Status [phone]) [place], [place], DPT [phone]

## 2017-12-08 ENCOUNTER — Ambulatory Visit (INDEPENDENT_AMBULATORY_CARE_PROVIDER_SITE_OTHER): Payer: Self-pay | Admitting: Surgery

## 2017-12-08 ENCOUNTER — Encounter: Payer: Self-pay | Admitting: Surgery

## 2017-12-08 VITALS — BP 150/90 | HR 66 | Temp 97.5°F | Resp 20 | Ht 75.0 in | Wt 139.0 lb

## 2017-12-08 DIAGNOSIS — I779 Disorder of arteries and arterioles, unspecified: Secondary | ICD-10-CM

## 2017-12-08 NOTE — Progress Notes (Signed)
   Patient name: [name] [name] MRN: [id number] DOB: 05-Dec-1950 Sex: male  REASON FOR VISIT:     post op  HISTORY OF PRESENT ILLNESS:   [name] [name] is a 67 y.o. male returns today for his postoperative visit.  In 2016 he underwent a left femoral to below-knee popliteal artery bypass graft with saphenous vein by Dr. early.  He had previously undergone aortobifemoral bypass graft in 2012.  He was seen by our nurse practitioner in December with a 1-2-week history of right calf pain.  He has had a significant drop in his ankle-brachial indices.  He underwent angiography on 11/13/2017 and was found to have an occluded right limb of his aortobifemoral bypass graft.  The left femoral-popliteal bypass graft was occluded.  On 1221 he underwent thrombectomy of the right limb of his aortobifemoral bypass graft.  Patch angioplasty was performed onto the profundofemoral artery.  He is back today for follow-up.  His right leg symptoms have completely resolved.  He still has pain in his left leg with walking.  He does not have any open wounds  CURRENT MEDICATIONS:    Current Outpatient Medications  Medication Sig Dispense Refill  . gabapentin (NEURONTIN) 300 MG capsule Take 300 mg by mouth 3 (three) times daily.    Marland Kitchen oxyCODONE (OXYCONTIN) 15 mg 12 hr tablet Take 15 mg by mouth every 12 (twelve) hours.    Marland Kitchen oxyCODONE (ROXICODONE) 15 MG immediate release tablet Take 15 mg by mouth every 6 (six) hours as needed for pain.    Marland Kitchen zolpidem (AMBIEN) 10 MG tablet Take 10 mg by mouth at bedtime.     No current facility-administered medications for this visit.     REVIEW OF SYSTEMS:   [X]  denotes positive finding, [ ]  denotes negative finding Cardiac  Comments:  Chest pain or chest pressure:  Palpable femoral pulses.  All incisions have healed   Shortness of breath upon exertion:    Short of breath when lying flat:    Irregular heart rhythm:    Constitutional    Fever  or chills:      PHYSICAL EXAM:   Vitals:   12/08/17 1013 12/08/17 1014  BP: (!) 153/91 (!) 150/90  Pulse: 66   Resp: 20   Temp: (!) 97.5 F (36.4 C)   TempSrc: Oral   SpO2: 100%   Weight: 139 lb (63 kg)   Height: 6\' 3"  (1.905 m)     GENERAL: The patient is a well-nourished male, in no acute distress. The vital signs are documented above. CARDIOVASCULAR: There is a regular rate and rhythm. PULMONARY: Non-labored respirations   STUDIES:   None   MEDICAL ISSUES:   The patient's right leg ischemic pain has resolved following thrombectomy of the right limb of the aortobifemoral bypass graft.  Patient still continues to have some claudication symptoms in the left leg.  I discussed the importance of an exercise program.  We also discussed that surgical revascularization would not be indicated except for limb threatening situations.  He will be scheduled for follow-up with duplex and ABIs in 3 months.  [name], MD [facility] [phone] Pager [phone]

## 2017-12-09 NOTE — Addendum Note (Signed)
Addended by: Lianne Cure A on: 12/09/2017 03:46 PM   Modules accepted: Orders

## 2018-01-20 ENCOUNTER — Ambulatory Visit: Payer: Medicaid Other | Admitting: Vascular Surgery

## 2018-01-20 ENCOUNTER — Encounter (HOSPITAL_COMMUNITY): Payer: Medicaid Other

## 2018-03-12 ENCOUNTER — Ambulatory Visit: Payer: Medicare Other | Admitting: Physician Assistant

## 2018-03-12 NOTE — Progress Notes (Deleted)
Cardiology Office Note    Date:  03/12/2018   ID:  MENNO VANBERGEN, DOB 05-31-1951, MRN [id number]  PCP:  Kristopher Glee., MD  Cardiologist: Dr. Burt Knack  Chief Complaint: yearly follow up for CAD  History of Present Illness:   [name] [name] is a 67 y.o. male  CAD, PAD, HTN, hyperlipidemia, diabetes, and seizure disorder presents for yearly follow up.   He initially presented in 2014 with NSTEMI and was found to have severe stenosis of the RCA treated with PCI (Promus DES) and chronic occlusion of the LCx/OM1 branch treated medically.   Hx of PVD s/p aortobifemoral bypass in 2012 (by Dr. Donnetta Hutching),  Left fem-pop bypass ( by Dr Early),  And most recently 10/2017 (by Dr. Trula Slade) underwent thrombectomy of the right limb of his aortobifemoral bypass graft.  Patch angioplasty was performed onto the profundofemoral artery.    He was doing well on cardiac stand point when last seen by Dr. Burt Knack 02/03/17.      Past Medical History:  Diagnosis Date  . Arthritis   . Diabetes mellitus without complication ([place])   . GERD (gastroesophageal reflux disease)   . Hypertension   . PAD (peripheral artery disease) ([place])     Past Surgical History:  Procedure Laterality Date  . ABDOMINAL AORTAGRAM N/A 11/11/2011   Procedure: ABDOMINAL Maxcine Ham;  Surgeon: Angelia Mould, MD;  Location: [facility] CATH LAB;  Service: Cardiovascular;  Laterality: N/A;  . ABDOMINAL AORTOGRAM W/LOWER EXTREMITY N/A 11/13/2017   Procedure: ABDOMINAL AORTOGRAM W/LOWER EXTREMITY;  Surgeon: Serafina Mitchell, MD;  Location: [place] CV LAB;  Service: Cardiovascular;  Laterality: N/A;  . AORTA - BILATERAL FEMORAL ARTERY BYPASS GRAFT  11/25/2011   Procedure: AORTA BIFEMORAL BYPASS GRAFT;  Surgeon: Rosetta Posner, MD;  Location: [place];  Service: Vascular;  Laterality: N/A;  . COLONOSCOPY    . FEMORAL-FEMORAL BYPASS GRAFT Right 11/14/2017   Procedure: REDO BYPASS  FEMORAL-FEMORAL ARTERY EXPOSURE;  Surgeon: Serafina Mitchell, MD;  Location: [place];  Service: Vascular;  Laterality: Right;  . FEMORAL-POPLITEAL BYPASS GRAFT Left 07/28/2015   Procedure: LEFT FEMORAL-POPLITEAL ARTERY BYPASS GRAFT;  Surgeon: Rosetta Posner, MD;  Location: [place];  Service: Vascular;  Laterality: Left;  . ILIAC ARTERY STENT  05/06/2011  . INGUINAL HERNIA REPAIR Right   . LAPAROTOMY N/A 12/01/2015   Procedure: EXPLORATORY LAPAROTOMY WITH RADICAL PERITONEAL DEBRIDEMENT, CLOSURE OF SMALL BOWEL PERFORATION AND LYSIS OF ADHESIONS.;  Surgeon: Johnathan Hausen, MD;  Location: [facility];  Service: General;  Laterality: N/A;  . LEFT HEART CATHETERIZATION WITH CORONARY ANGIOGRAM N/A 06/07/2013   Procedure: LEFT HEART CATHETERIZATION WITH CORONARY ANGIOGRAM;  Surgeon: Sherren Mocha, MD;  Location: [facility] CATH LAB;  Service: Cardiovascular;  Laterality: N/A;  . LEFT HEART CATHETERIZATION WITH CORONARY ANGIOGRAM N/A 12/10/2013   Procedure: LEFT HEART CATHETERIZATION WITH CORONARY ANGIOGRAM;  Surgeon: Blane Ohara, MD;  Location: [facility] CATH LAB;  Service: Cardiovascular;  Laterality: N/A;  . LOWER EXTREMITY ANGIOGRAM Bilateral 11/11/2011   Procedure: LOWER EXTREMITY ANGIOGRAM;  Surgeon: Angelia Mould, MD;  Location: [facility] CATH LAB;  Service: Cardiovascular;  Laterality: Bilateral;  . PATCH ANGIOPLASTY N/A 11/14/2017   Procedure: [place];  Surgeon: Serafina Mitchell, MD;  Location: [facility] OR;  Service: Vascular;  Laterality: N/A;  . PERCUTANEOUS CORONARY STENT INTERVENTION (PCI-S) Right 06/07/2013   Procedure: PERCUTANEOUS CORONARY STENT INTERVENTION (PCI-S);  Surgeon: Sherren Mocha, MD;  Location: [facility] CATH LAB;  Service: Cardiovascular;  Laterality: Right;  . PERIPHERAL VASCULAR CATHETERIZATION N/A 07/26/2015   Procedure: Abdominal Aortogram;  Surgeon: Serafina Mitchell, MD;  Location: [place] CV LAB;  Service: Cardiovascular;  Laterality: N/A;  . PERIPHERAL VASCULAR CATHETERIZATION Bilateral 07/26/2015   Procedure: Lower  Extremity Angiography;  Surgeon: Serafina Mitchell, MD;  Location: [place] CV LAB;  Service: Cardiovascular;  Laterality: Bilateral;  . PR VEIN BYPASS GRAFT,AORTO-FEM-POP  11/24/2012  . THROMBECTOMY FEMORAL ARTERY N/A 11/14/2017   Procedure: THROMBECTOMY AORTA  BI-FEMORAL;  Surgeon: Serafina Mitchell, MD;  Location: [facility] OR;  Service: Vascular;  Laterality: N/A;  . TONSILLECTOMY    . ULTRASOUND GUIDANCE FOR VASCULAR ACCESS  11/13/2017   Procedure: Ultrasound Guidance For Vascular Access;  Surgeon: Serafina Mitchell, MD;  Location: [place] CV LAB;  Service: Cardiovascular;;    Current Medications: Prior to Admission medications   Medication Sig Start Date End Date Taking? Authorizing Provider  gabapentin (NEURONTIN) 300 MG capsule Take 300 mg by mouth 3 (three) times daily.    [provider]  oxyCODONE (OXYCONTIN) 15 mg 12 hr tablet Take 15 mg by mouth every 12 (twelve) hours.    [provider]  oxyCODONE (ROXICODONE) 15 MG immediate release tablet Take 15 mg by mouth every 6 (six) hours as needed for pain.    [provider]  zolpidem (AMBIEN) 10 MG tablet Take 10 mg by mouth at bedtime.    [provider]    Allergies:   Zetia [ezetimibe]; Pravastatin sodium; Atorvastatin; Crestor [rosuvastatin]; Lisinopril; and Promethazine   Social History   Socioeconomic History  . Marital status: Single    Spouse name: Not on file  . Number of children: Not on file  . Years of education: Not on file  . Highest education level: Not on file  Occupational History  . Not on file  Social Needs  . Financial resource strain: Not on file  . Food insecurity:    Worry: Not on file    Inability: Not on file  . Transportation needs:    Medical: Not on file    Non-medical: Not on file  Tobacco Use  . Smoking status: Former Smoker    Packs/day: 0.50    Years: 46.00    Pack years: 23.00    Types: Cigarettes    Last attempt to quit: 11/12/2017    Years since  quitting: 0.3  . Smokeless tobacco: Never Used  Substance and Sexual Activity  . Alcohol use: No    Alcohol/week: 0.0 oz    Comment: a12/17/20214 "last drink was 3-4 yr ago; never had problem w/it"  . Drug use: No    Frequency: 1.0 times per week    Types: Marijuana, Cocaine    Comment: stopped marijuana May 07, 2012, stopped cocaine 1980  . Sexual activity: Not Currently  Lifestyle  . Physical activity:    Days per week: Not on file    Minutes per session: Not on file  . Stress: Not on file  Relationships  . Social connections:    Talks on phone: Not on file    Gets together: Not on file    Attends religious service: Not on file    Active member of club or organization: Not on file    Attends meetings of clubs or organizations: Not on file    Relationship status: Not on file  Other Topics Concern  . Not on file  Social History Narrative  . Not on file  Family History:  The patient's family history includes Aneurysm in his father; Diabetes in his mother; Heart attack in his mother and sister; Heart disease in his brother, father, mother, and sister; Hypertension in his mother. ***  ROS:   Please see the history of present illness.    ROS All other systems reviewed and are negative.   PHYSICAL EXAM:   VS:  There were no vitals taken for this visit.   GEN: Well nourished, well developed, in no acute distress  HEENT: normal  Neck: no JVD, carotid bruits, or masses Cardiac: ***RRR; no murmurs, rubs, or gallops,no edema  Respiratory:  clear to auscultation bilaterally, normal work of breathing GI: soft, nontender, nondistended, + BS MS: no deformity or atrophy  Skin: warm and dry, no rash Neuro:  Alert and Oriented x 3, Strength and sensation are intact Psych: euthymic mood, full affect  Wt Readings from Last 3 Encounters:  12/08/17 139 lb (63 kg)  11/14/17 138 lb (62.6 kg)  11/10/17 135 lb 9.6 oz (61.5 kg)      Studies/Labs Reviewed:   EKG:  EKG is ordered  today.  The ekg ordered today demonstrates ***  Recent Labs: 11/15/2017: BUN 14; Creatinine, Ser 0.86; Hemoglobin 12.5; Platelets PLATELET CLUMPS NOTED ON SMEAR, UNABLE TO ESTIMATE; Potassium 4.4; Sodium 139   Lipid Panel    Component Value Date/Time   CHOL 154 11/24/2015 0445   TRIG 115 12/25/2015 0450   HDL 36 (L) 11/24/2015 0445   CHOLHDL 4.3 11/24/2015 0445   VLDL 22 11/24/2015 0445   LDLCALC 96 11/24/2015 0445    Additional studies/ records that were reviewed today include:   As above  ASSESSMENT & PLAN:    1. CAD  2. PVD - AS described above. Followed by [facility].   3. HTN  4. HLD    Medication Adjustments/Labs and Tests Ordered: Current medicines are reviewed at length with the patient today.  Concerns regarding medicines are outlined above.  Medication changes, Labs and Tests ordered today are listed in the Patient Instructions below. There are no Patient Instructions on file for this visit.   Jarrett Soho, [place]  03/12/2018 1:26 PM    [facility] [place], [place], [place]  [address] Phone: [phone]; Fax: [phone]

## 2018-03-17 ENCOUNTER — Encounter (HOSPITAL_COMMUNITY): Payer: Medicare Other

## 2018-03-17 ENCOUNTER — Ambulatory Visit: Payer: Medicare Other | Admitting: Vascular Surgery

## 2018-04-08 NOTE — Progress Notes (Deleted)
Cardiology Office Note    Date:  04/08/2018   ID:  [name] [name], DOB 1951-10-20, MRN [id number]  PCP:  Kristopher Glee., MD  Cardiologist:  Dr. Burt Knack   Chief Complaint: yearly follow up for CAD  History of Present Illness:   [name] [name] is a 67 y.o. male with hx of CAD, PAD followed by [facility], HTN, HLD, DM, SBO and seizure disorder presented for follow up.   He initially presented in 2014 with NSTEMI and was found to have severe stenosis of the RCA treated with PCI (Promus DES) and chronic occlusion of the LCx/OM1 branch treated medically  In regards to his PAD s/p  aortobifemoral bypass in 2012; left fem-pop bypass in 2016 and most recently 11/14/17 underwent thrombectomy of the right limb of his aortobifemoral bypass graft.  Patch angioplasty was performed onto the profundofemoral artery.  Noted uncontrolled HTN during last OV with Dr. Burt Knack 01/2017. Declined antihypertensive therapy.   Here today for yearly follow up.     Past Medical History:  Diagnosis Date  . Arthritis   . Diabetes mellitus without complication ([place])   . GERD (gastroesophageal reflux disease)   . Hypertension   . PAD (peripheral artery disease) ([place])     Past Surgical History:  Procedure Laterality Date  . ABDOMINAL AORTAGRAM N/A 11/11/2011   Procedure: ABDOMINAL Maxcine Ham;  Surgeon: Angelia Mould, MD;  Location: [facility] CATH LAB;  Service: Cardiovascular;  Laterality: N/A;  . ABDOMINAL AORTOGRAM W/LOWER EXTREMITY N/A 11/13/2017   Procedure: ABDOMINAL AORTOGRAM W/LOWER EXTREMITY;  Surgeon: Serafina Mitchell, MD;  Location: [place] CV LAB;  Service: Cardiovascular;  Laterality: N/A;  . AORTA - BILATERAL FEMORAL ARTERY BYPASS GRAFT  11/25/2011   Procedure: AORTA BIFEMORAL BYPASS GRAFT;  Surgeon: Rosetta Posner, MD;  Location: [place];  Service: Vascular;  Laterality: N/A;  . COLONOSCOPY    . FEMORAL-FEMORAL BYPASS GRAFT Right 11/14/2017   Procedure: REDO BYPASS  FEMORAL-FEMORAL ARTERY  EXPOSURE;  Surgeon: Serafina Mitchell, MD;  Location: [place];  Service: Vascular;  Laterality: Right;  . FEMORAL-POPLITEAL BYPASS GRAFT Left 07/28/2015   Procedure: LEFT FEMORAL-POPLITEAL ARTERY BYPASS GRAFT;  Surgeon: Rosetta Posner, MD;  Location: [place];  Service: Vascular;  Laterality: Left;  . ILIAC ARTERY STENT  05/06/2011  . INGUINAL HERNIA REPAIR Right   . LAPAROTOMY N/A 12/01/2015   Procedure: EXPLORATORY LAPAROTOMY WITH RADICAL PERITONEAL DEBRIDEMENT, CLOSURE OF SMALL BOWEL PERFORATION AND LYSIS OF ADHESIONS.;  Surgeon: Johnathan Hausen, MD;  Location: [facility];  Service: General;  Laterality: N/A;  . LEFT HEART CATHETERIZATION WITH CORONARY ANGIOGRAM N/A 06/07/2013   Procedure: LEFT HEART CATHETERIZATION WITH CORONARY ANGIOGRAM;  Surgeon: Sherren Mocha, MD;  Location: [facility] CATH LAB;  Service: Cardiovascular;  Laterality: N/A;  . LEFT HEART CATHETERIZATION WITH CORONARY ANGIOGRAM N/A 12/10/2013   Procedure: LEFT HEART CATHETERIZATION WITH CORONARY ANGIOGRAM;  Surgeon: Blane Ohara, MD;  Location: [facility] CATH LAB;  Service: Cardiovascular;  Laterality: N/A;  . LOWER EXTREMITY ANGIOGRAM Bilateral 11/11/2011   Procedure: LOWER EXTREMITY ANGIOGRAM;  Surgeon: Angelia Mould, MD;  Location: [facility] CATH LAB;  Service: Cardiovascular;  Laterality: Bilateral;  . PATCH ANGIOPLASTY N/A 11/14/2017   Procedure: [place];  Surgeon: Serafina Mitchell, MD;  Location: [facility] OR;  Service: Vascular;  Laterality: N/A;  . PERCUTANEOUS CORONARY STENT INTERVENTION (PCI-S) Right 06/07/2013   Procedure: PERCUTANEOUS CORONARY STENT INTERVENTION (PCI-S);  Surgeon: Sherren Mocha, MD;  Location: [facility] CATH LAB;  Service:  Cardiovascular;  Laterality: Right;  . PERIPHERAL VASCULAR CATHETERIZATION N/A 07/26/2015   Procedure: Abdominal Aortogram;  Surgeon: Serafina Mitchell, MD;  Location: [place] CV LAB;  Service: Cardiovascular;  Laterality: N/A;  . PERIPHERAL VASCULAR CATHETERIZATION Bilateral  07/26/2015   Procedure: Lower Extremity Angiography;  Surgeon: Serafina Mitchell, MD;  Location: [place] CV LAB;  Service: Cardiovascular;  Laterality: Bilateral;  . PR VEIN BYPASS GRAFT,AORTO-FEM-POP  11/24/2012  . THROMBECTOMY FEMORAL ARTERY N/A 11/14/2017   Procedure: THROMBECTOMY AORTA  BI-FEMORAL;  Surgeon: Serafina Mitchell, MD;  Location: [facility] OR;  Service: Vascular;  Laterality: N/A;  . TONSILLECTOMY    . ULTRASOUND GUIDANCE FOR VASCULAR ACCESS  11/13/2017   Procedure: Ultrasound Guidance For Vascular Access;  Surgeon: Serafina Mitchell, MD;  Location: [place] CV LAB;  Service: Cardiovascular;;    Current Medications: Prior to Admission medications   Medication Sig Start Date End Date Taking? Authorizing Provider  gabapentin (NEURONTIN) 300 MG capsule Take 300 mg by mouth 3 (three) times daily.    [provider]  oxyCODONE (OXYCONTIN) 15 mg 12 hr tablet Take 15 mg by mouth every 12 (twelve) hours.    [provider]  oxyCODONE (ROXICODONE) 15 MG immediate release tablet Take 15 mg by mouth every 6 (six) hours as needed for pain.    [provider]  zolpidem (AMBIEN) 10 MG tablet Take 10 mg by mouth at bedtime.    [provider]    Allergies:   Zetia [ezetimibe]; Pravastatin sodium; Atorvastatin; Crestor [rosuvastatin]; Lisinopril; and Promethazine   Social History   Socioeconomic History  . Marital status: Single    Spouse name: Not on file  . Number of children: Not on file  . Years of education: Not on file  . Highest education level: Not on file  Occupational History  . Not on file  Social Needs  . Financial resource strain: Not on file  . Food insecurity:    Worry: Not on file    Inability: Not on file  . Transportation needs:    Medical: Not on file    Non-medical: Not on file  Tobacco Use  . Smoking status: Former Smoker    Packs/day: 0.50    Years: 46.00    Pack years: 23.00    Types: Cigarettes    Last attempt to  quit: 11/12/2017    Years since quitting: 0.4  . Smokeless tobacco: Never Used  Substance and Sexual Activity  . Alcohol use: No    Alcohol/week: 0.0 oz    Comment: a12/17/20214 "last drink was 3-4 yr ago; never had problem w/it"  . Drug use: No    Frequency: 1.0 times per week    Types: Marijuana, Cocaine    Comment: stopped marijuana May 07, 2012, stopped cocaine 1980  . Sexual activity: Not Currently  Lifestyle  . Physical activity:    Days per week: Not on file    Minutes per session: Not on file  . Stress: Not on file  Relationships  . Social connections:    Talks on phone: Not on file    Gets together: Not on file    Attends religious service: Not on file    Active member of club or organization: Not on file    Attends meetings of clubs or organizations: Not on file    Relationship status: Not on file  Other Topics Concern  . Not on file  Social History Narrative  . Not on file  Family History:  The patient's family history includes Aneurysm in his father; Diabetes in his mother; Heart attack in his mother and sister; Heart disease in his brother, father, mother, and sister; Hypertension in his mother. ***  ROS:   Please see the history of present illness.    ROS All other systems reviewed and are negative.   PHYSICAL EXAM:   VS:  There were no vitals taken for this visit.   GEN: Well nourished, well developed, in no acute distress  HEENT: normal  Neck: no JVD, carotid bruits, or masses Cardiac: ***RRR; no murmurs, rubs, or gallops,no edema  Respiratory:  clear to auscultation bilaterally, normal work of breathing GI: soft, nontender, nondistended, + BS MS: no deformity or atrophy  Skin: warm and dry, no rash Neuro:  Alert and Oriented x 3, Strength and sensation are intact Psych: euthymic mood, full affect  Wt Readings from Last 3 Encounters:  12/08/17 139 lb (63 kg)  11/14/17 138 lb (62.6 kg)  11/10/17 135 lb 9.6 oz (61.5 kg)      Studies/Labs  Reviewed:   EKG:  EKG is ordered today.  The ekg ordered today demonstrates ***  Recent Labs: 11/15/2017: BUN 14; Creatinine, Ser 0.86; Hemoglobin 12.5; Platelets PLATELET CLUMPS NOTED ON SMEAR, UNABLE TO ESTIMATE; Potassium 4.4; Sodium 139   Lipid Panel    Component Value Date/Time   CHOL 154 11/24/2015 0445   TRIG 115 12/25/2015 0450   HDL 36 (L) 11/24/2015 0445   CHOLHDL 4.3 11/24/2015 0445   VLDL 22 11/24/2015 0445   LDLCALC 96 11/24/2015 0445    Additional studies/ records that were reviewed today include:   Echocardiogram:  Cardiac Catheterization:     ASSESSMENT & PLAN:    1. ***    Medication Adjustments/Labs and Tests Ordered: Current medicines are reviewed at length with the patient today.  Concerns regarding medicines are outlined above.  Medication changes, Labs and Tests ordered today are listed in the Patient Instructions below. There are no Patient Instructions on file for this visit.   Jarrett Soho, [place]  04/08/2018 3:39 PM    [facility] [place], [place], [place]  [address] Phone: [phone]; Fax: [phone]

## 2018-04-10 ENCOUNTER — Ambulatory Visit: Payer: Medicare Other | Admitting: Physician Assistant

## 2018-04-10 ENCOUNTER — Encounter (HOSPITAL_COMMUNITY): Payer: Self-pay | Admitting: *Deleted

## 2018-04-10 ENCOUNTER — Emergency Department (HOSPITAL_COMMUNITY)
Admission: EM | Admit: 2018-04-10 | Discharge: 2018-04-10 | Disposition: A | Discharge status: Home / Self Care | Payer: Medicare Other | Attending: Emergency Medicine | Admitting: Emergency Medicine

## 2018-04-10 ENCOUNTER — Other Ambulatory Visit: Payer: Self-pay

## 2018-04-10 DIAGNOSIS — R1013 Epigastric pain: Secondary | ICD-10-CM

## 2018-04-10 DIAGNOSIS — E119 Type 2 diabetes mellitus without complications: Secondary | ICD-10-CM | POA: Insufficient documentation

## 2018-04-10 DIAGNOSIS — I10 Essential (primary) hypertension: Secondary | ICD-10-CM | POA: Diagnosis not present

## 2018-04-10 DIAGNOSIS — T50901A Poisoning by unspecified drugs, medicaments and biological substances, accidental (unintentional), initial encounter: Secondary | ICD-10-CM

## 2018-04-10 DIAGNOSIS — Z87891 Personal history of nicotine dependence: Secondary | ICD-10-CM | POA: Diagnosis not present

## 2018-04-10 DIAGNOSIS — Z79899 Other long term (current) drug therapy: Secondary | ICD-10-CM | POA: Insufficient documentation

## 2018-04-10 DIAGNOSIS — F1120 Opioid dependence, uncomplicated: Secondary | ICD-10-CM | POA: Insufficient documentation

## 2018-04-10 LAB — CBC
HCT: 47.7 % (ref 39.0–52.0)
Hemoglobin: 15.1 g/dL (ref 13.0–17.0)
MCH: 29 pg (ref 26.0–34.0)
MCHC: 31.7 g/dL (ref 30.0–36.0)
MCV: 91.7 fL (ref 78.0–100.0)
PLATELETS: UNDETERMINED 10*3/uL (ref 150–400)
RBC: 5.2 MIL/uL (ref 4.22–5.81)
RDW: 13.8 % (ref 11.5–15.5)
WBC: 5.5 10*3/uL (ref 4.0–10.5)

## 2018-04-10 LAB — ACETAMINOPHEN LEVEL

## 2018-04-10 LAB — RAPID URINE DRUG SCREEN, HOSP PERFORMED
Amphetamines: NOT DETECTED
BARBITURATES: NOT DETECTED
Benzodiazepines: NOT DETECTED
COCAINE: NOT DETECTED
Opiates: POSITIVE — AB
Tetrahydrocannabinol: POSITIVE — AB

## 2018-04-10 LAB — COMPREHENSIVE METABOLIC PANEL
ALBUMIN: 4.7 g/dL (ref 3.5–5.0)
ALT: 20 U/L (ref 17–63)
AST: 29 U/L (ref 15–41)
Alkaline Phosphatase: 63 U/L (ref 38–126)
Anion gap: 10 (ref 5–15)
BUN: 15 mg/dL (ref 6–20)
CHLORIDE: 100 mmol/L — AB (ref 101–111)
CO2: 28 mmol/L (ref 22–32)
CREATININE: 0.79 mg/dL (ref 0.61–1.24)
Calcium: 10.7 mg/dL — ABNORMAL HIGH (ref 8.9–10.3)
GFR calc Af Amer: >60 mL/min (ref >60)
GFR calc non Af Amer: >60 mL/min (ref >60)
GLUCOSE: 127 mg/dL — AB (ref 65–99)
Potassium: 4.2 mmol/L (ref 3.5–5.1)
SODIUM: 138 mmol/L (ref 135–145)
Total Bilirubin: 0.6 mg/dL (ref 0.3–1.2)
Total Protein: 7.7 g/dL (ref 6.5–8.1)

## 2018-04-10 LAB — URINALYSIS, ROUTINE W REFLEX MICROSCOPIC
BILIRUBIN URINE: NEGATIVE
Bacteria, UA: NONE SEEN
Glucose, UA: NEGATIVE mg/dL
Ketones, ur: NEGATIVE mg/dL
LEUKOCYTES UA: NEGATIVE
Nitrite: NEGATIVE
Protein, ur: 100 mg/dL — AB
Specific Gravity, Urine: 1.017 (ref 1.005–1.030)
pH: 7 (ref 5.0–8.0)

## 2018-04-10 LAB — SALICYLATE LEVEL

## 2018-04-10 LAB — ETHANOL: Alcohol, Ethyl (B): <10 mg/dL (ref <10)

## 2018-04-10 MED ORDER — OXYCODONE HCL 5 MG PO TABS
10.0000 mg | ORAL_TABLET | Freq: Once | ORAL | Status: AC
Start: 2018-04-10 — End: 2018-04-10
  Administered 2018-04-10: 10 mg via ORAL
  Filled 2018-04-10: qty 2

## 2018-04-10 NOTE — ED Notes (Signed)
Patient walked to bathroom and back to bed with no problems, steady gait.

## 2018-04-10 NOTE — ED Notes (Signed)
Pt attempting to climb out of bed, not answering questions.

## 2018-04-10 NOTE — Discharge Instructions (Addendum)
Take your usual medications as prescribed. Follow up with your doctor for recheck as needed.

## 2018-04-10 NOTE — ED Notes (Signed)
ED Provider at bedside. 

## 2018-04-10 NOTE — ED Triage Notes (Signed)
The pt arrived by [facility] from home  The pt told ems that he had ingested 40 mg of oxycodone sometime today  The  hje took narcan and c/o burning all over.  ujnable to answer questions in triage  Grabbing for things he thinks he sees agitated then falls asleep  Having to be held in the w/c  C/o nausea

## 2018-04-10 NOTE — ED Triage Notes (Signed)
The pt will not answer questions  He is confused.  Not focusing  Non-sense conversation

## 2018-04-10 NOTE — ED Provider Notes (Signed)
[place] EMERGENCY DEPARTMENT Provider Note   CSN: [id number] Arrival date & time: 04/10/18  0200     History   Chief Complaint Chief Complaint  Patient presents with  . Ingestion    HPI [name] [name] is a 67 y.o. male.  The patient arrives via EMS with complaint of "burning all over" after taking Narcan earlier tonight at home. He reports he is on chronic pain medication for back pain. He states he hasn't slept in 36 hours due to stress and took more than his usual dose of narcotics. He then used narcan, which is the first time he has ever needed it, and feels he had a reaction described as "my stomach was on fire". No vomiting. He states that his symptoms of epigastric pain have resolved and he currently has no complaints.   The history is provided by the patient. No language interpreter was used.  Ingestion  Associated symptoms include abdominal pain. Pertinent negatives include no chest pain and no shortness of breath.    Past Medical History:  Diagnosis Date  . Arthritis   . Diabetes mellitus without complication ([place])   . GERD (gastroesophageal reflux disease)   . Hypertension   . PAD (peripheral artery disease) [facility])     Patient Active Problem List   Diagnosis Date Noted  . PAD (peripheral artery disease) ([place]) 11/13/2017  . Mood disorder ([place]) 01/28/2016  . Depression 01/28/2016  . Acute blood loss anemia 01/06/2016  . Polyneuropathy 01/06/2016  . Hypomagnesemia 12/16/2015  . Demand ischemia on 1/20 from acute blood loss anemia ([place]) 12/16/2015  . Abdominal wall fluid collections   . Acute encephalopathy 12/12/2015  . Seizures ([place]) 12/12/2015  . Acute kidney injury ([place]) 12/12/2015  . Controlled diabetes mellitus with circulatory complication, without long-term current use of insulin ([place]) 12/12/2015  . Anemia of chronic disease 12/12/2015  . Sepsis ([place]) 12/12/2015  . Leukocytosis 12/12/2015  . Thrombocytopenia ([place]) 12/12/2015  .  Palliative care encounter   . Acute respiratory failure with hypoxemia ([place]) 12/06/2015  . DVT of axillary vein, acute right ([place])   . Small bowel obstruction & perforation s/p OR exploration & repair 12/01/2015 12/02/2015  . Purulent peritonitis ([place]) 12/02/2015  . Protein-calorie malnutrition, severe ([place]) 07/30/2015    Past Surgical History:  Procedure Laterality Date  . ABDOMINAL AORTAGRAM N/A 11/11/2011   Procedure: ABDOMINAL Maxcine Ham;  Surgeon: Angelia Mould, MD;  Location: [facility] CATH LAB;  Service: Cardiovascular;  Laterality: N/A;  . ABDOMINAL AORTOGRAM W/LOWER EXTREMITY N/A 11/13/2017   Procedure: ABDOMINAL AORTOGRAM W/LOWER EXTREMITY;  Surgeon: Serafina Mitchell, MD;  Location: [place] CV LAB;  Service: Cardiovascular;  Laterality: N/A;  . AORTA - BILATERAL FEMORAL ARTERY BYPASS GRAFT  11/25/2011   Procedure: AORTA BIFEMORAL BYPASS GRAFT;  Surgeon: Rosetta Posner, MD;  Location: [place];  Service: Vascular;  Laterality: N/A;  . COLONOSCOPY    . FEMORAL-FEMORAL BYPASS GRAFT Right 11/14/2017   Procedure: REDO BYPASS  FEMORAL-FEMORAL ARTERY EXPOSURE;  Surgeon: Serafina Mitchell, MD;  Location: [place];  Service: Vascular;  Laterality: Right;  . FEMORAL-POPLITEAL BYPASS GRAFT Left 07/28/2015   Procedure: LEFT FEMORAL-POPLITEAL ARTERY BYPASS GRAFT;  Surgeon: Rosetta Posner, MD;  Location: [place];  Service: Vascular;  Laterality: Left;  . ILIAC ARTERY STENT  05/06/2011  . INGUINAL HERNIA REPAIR Right   . LAPAROTOMY N/A 12/01/2015   Procedure: EXPLORATORY LAPAROTOMY WITH RADICAL PERITONEAL DEBRIDEMENT, CLOSURE OF SMALL BOWEL PERFORATION AND LYSIS OF ADHESIONS.;  Surgeon: Johnathan Hausen, MD;  Location: [facility] ORS;  Service: General;  Laterality: N/A;  . LEFT HEART CATHETERIZATION WITH CORONARY ANGIOGRAM N/A 06/07/2013   Procedure: LEFT HEART CATHETERIZATION WITH CORONARY ANGIOGRAM;  Surgeon: Sherren Mocha, MD;  Location: [facility] CATH LAB;  Service: Cardiovascular;  Laterality: N/A;  . LEFT HEART  CATHETERIZATION WITH CORONARY ANGIOGRAM N/A 12/10/2013   Procedure: LEFT HEART CATHETERIZATION WITH CORONARY ANGIOGRAM;  Surgeon: Blane Ohara, MD;  Location: [facility] CATH LAB;  Service: Cardiovascular;  Laterality: N/A;  . LOWER EXTREMITY ANGIOGRAM Bilateral 11/11/2011   Procedure: LOWER EXTREMITY ANGIOGRAM;  Surgeon: Angelia Mould, MD;  Location: [facility] CATH LAB;  Service: Cardiovascular;  Laterality: Bilateral;  . PATCH ANGIOPLASTY N/A 11/14/2017   Procedure: [place];  Surgeon: Serafina Mitchell, MD;  Location: [facility] OR;  Service: Vascular;  Laterality: N/A;  . PERCUTANEOUS CORONARY STENT INTERVENTION (PCI-S) Right 06/07/2013   Procedure: PERCUTANEOUS CORONARY STENT INTERVENTION (PCI-S);  Surgeon: Sherren Mocha, MD;  Location: [facility] CATH LAB;  Service: Cardiovascular;  Laterality: Right;  . PERIPHERAL VASCULAR CATHETERIZATION N/A 07/26/2015   Procedure: Abdominal Aortogram;  Surgeon: Serafina Mitchell, MD;  Location: [place] CV LAB;  Service: Cardiovascular;  Laterality: N/A;  . PERIPHERAL VASCULAR CATHETERIZATION Bilateral 07/26/2015   Procedure: Lower Extremity Angiography;  Surgeon: Serafina Mitchell, MD;  Location: [place] CV LAB;  Service: Cardiovascular;  Laterality: Bilateral;  . PR VEIN BYPASS GRAFT,AORTO-FEM-POP  11/24/2012  . THROMBECTOMY FEMORAL ARTERY N/A 11/14/2017   Procedure: THROMBECTOMY AORTA  BI-FEMORAL;  Surgeon: Serafina Mitchell, MD;  Location: [facility] OR;  Service: Vascular;  Laterality: N/A;  . TONSILLECTOMY    . ULTRASOUND GUIDANCE FOR VASCULAR ACCESS  11/13/2017   Procedure: Ultrasound Guidance For Vascular Access;  Surgeon: Serafina Mitchell, MD;  Location: [place] CV LAB;  Service: Cardiovascular;;        Home Medications    Prior to Admission medications   Medication Sig Start Date End Date Taking? Authorizing Provider  gabapentin (NEURONTIN) 300 MG capsule Take 300 mg by mouth 3 (three) times daily.    [provider]   oxyCODONE (OXYCONTIN) 15 mg 12 hr tablet Take 15 mg by mouth every 12 (twelve) hours.    [provider]  oxyCODONE (ROXICODONE) 15 MG immediate release tablet Take 15 mg by mouth every 6 (six) hours as needed for pain.    [provider]  zolpidem (AMBIEN) 10 MG tablet Take 10 mg by mouth at bedtime.    [provider]    Family History Family History  Problem Relation Age of Onset  . Hypertension Mother   . Heart attack Mother   . Diabetes Mother   . Heart disease Mother   . Aneurysm Father   . Heart disease Father   . Heart attack Sister   . Heart disease Sister   . Heart disease Brother        heart transplant    Social History Social History   Tobacco Use  . Smoking status: Former Smoker    Packs/day: 0.50    Years: 46.00    Pack years: 23.00    Types: Cigarettes    Last attempt to quit: 11/12/2017    Years since quitting: 0.4  . Smokeless tobacco: Never Used  Substance Use Topics  . Alcohol use: No    Alcohol/week: 0.0 oz    Comment: a12/17/20214 "last drink was 3-4 yr ago; never had problem w/it"  . Drug use: No  Frequency: 1.0 times per week    Types: Marijuana, Cocaine    Comment: stopped marijuana May 07, 2012, stopped cocaine 1980     Allergies   Zetia [ezetimibe]; Pravastatin sodium; Atorvastatin; Crestor [rosuvastatin]; Lisinopril; and Promethazine   Review of Systems Review of Systems  Constitutional: Negative for chills and fever.  Respiratory: Negative.  Negative for shortness of breath.   Cardiovascular: Negative.  Negative for chest pain.  Gastrointestinal: Positive for abdominal pain. Negative for vomiting.  Musculoskeletal: Positive for back pain (chronic).  Skin: Negative.   Neurological: Negative.  Negative for weakness and numbness.     Physical Exam Updated Vital Signs BP (!) 151/105 (BP Location: Right Arm)   Pulse 77   Temp 99.6 F (37.6 C) (Axillary)   Resp 20   Ht 6\' 3"  (1.905 m)   Wt 63 kg  (139 lb)   SpO2 95%   BMI 17.37 kg/m   Physical Exam  Constitutional: He is oriented to person, place, and time. He appears well-developed and well-nourished. No distress.  Cardiovascular: Normal rate.  No murmur heard. Pulmonary/Chest: Effort normal. He has no wheezes. He has no rales.  Abdominal: Soft. There is no tenderness.  Neurological: He is alert and oriented to person, place, and time. He exhibits normal muscle tone.  The patient is oriented to my exam x 3. CN's 3-12 grossly intact. Speech is clear and focused. No facial asymmetry. No lateralizing weakness.  No deficits of coordination. Ambulatory without imbalance.    Skin: Skin is warm and dry.     ED Treatments / Results  Labs (all labs ordered are listed, but only abnormal results are displayed) Labs Reviewed  COMPREHENSIVE METABOLIC PANEL - Abnormal; Notable for the following components:      Result Value   Chloride 100 (*)    Glucose, Bld 127 (*)    Calcium 10.7 (*)    All other components within normal limits  CBC  URINALYSIS, ROUTINE W REFLEX MICROSCOPIC  ACETAMINOPHEN LEVEL  ETHANOL  RAPID URINE DRUG SCREEN, HOSP PERFORMED  SALICYLATE LEVEL    EKG None  Radiology No results found.  Procedures Procedures (including critical care time)  Medications Ordered in ED Medications - No data to display   Initial Impression / Assessment and Plan / ED Course  I have reviewed the triage vital signs and the nursing notes.  Pertinent labs & imaging results that were available during my care of the patient were reviewed by me and considered in my medical decision making (see chart for details).     Patient noted to be confused on triage. He is currently oriented, coherent, able to give a focused history. He is having no epigastric pain.   He reports that at home he takes pain medication about every 2 hours and he is starting to have pain in his back c/w chronic pain.   There is no confusion. No intent to  self harm. He is ambulatory, coherent. He can be discharged home without concern  Final Clinical Impressions(s) / ED Diagnoses   Final diagnoses:  None   1. Epigastric pain, resolved 2. Narcotic dependence  ED Discharge Orders    None       Charlann Lange, PA-C 04/10/18 0403    Orpah Greek, MD 04/10/18 [phone]

## 2018-05-12 ENCOUNTER — Ambulatory Visit (HOSPITAL_COMMUNITY)
Admission: RE | Admit: 2018-05-12 | Discharge: 2018-05-12 | Disposition: A | Discharge status: Home / Self Care | Payer: Medicare Other | Source: Ambulatory Visit | Attending: Vascular Surgery | Admitting: Vascular Surgery

## 2018-05-12 ENCOUNTER — Other Ambulatory Visit: Payer: Self-pay

## 2018-05-12 ENCOUNTER — Encounter: Payer: Self-pay | Admitting: Vascular Surgery

## 2018-05-12 ENCOUNTER — Ambulatory Visit (INDEPENDENT_AMBULATORY_CARE_PROVIDER_SITE_OTHER): Payer: Medicare Other | Admitting: Vascular Surgery

## 2018-05-12 ENCOUNTER — Ambulatory Visit (INDEPENDENT_AMBULATORY_CARE_PROVIDER_SITE_OTHER)
Admission: RE | Admit: 2018-05-12 | Discharge: 2018-05-12 | Disposition: A | Discharge status: Home / Self Care | Payer: Medicare Other | Source: Ambulatory Visit | Attending: Surgery | Admitting: Surgery

## 2018-05-12 VITALS — BP 171/93 | HR 50 | Temp 99.1°F | Resp 16 | Ht 75.0 in | Wt 145.0 lb

## 2018-05-12 DIAGNOSIS — I779 Disorder of arteries and arterioles, unspecified: Secondary | ICD-10-CM

## 2018-05-12 NOTE — Progress Notes (Signed)
Vascular and Vein Specialist of [place]  Patient name: [name] [name] MRN: [id number] DOB: 05/09/1951 Sex: male  REASON FOR VISIT: Follow-up extensive past peripheral vascular history  HPI: [name] [name] is a 67 y.o. male here today for follow-up.  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  He subsequently underwent left femoral to popliteal bypass in 2016 with a major gangrene in his left foot.  He presented in December 2018 with occlusion of the right limb of his aortofemoral bypass and was also found to have an asymptomatic occlusion of his left femoral to popliteal bypass.  He underwent thrombectomy and patch angioplasty distal revision of his right limb of his graft and is done well since that time.  He reports that he is walking without difficulty and denies any claudication symptoms.  He has had no tissue loss  Past Medical History:  Diagnosis Date  . Arthritis   . Diabetes mellitus without complication ([place])   . GERD (gastroesophageal reflux disease)   . Hypertension   . PAD (peripheral artery disease) ([facility])     Family History  Problem Relation Age of Onset  . Hypertension Mother   . Heart attack Mother   . Diabetes Mother   . Heart disease Mother   . Aneurysm Father   . Heart disease Father   . Heart attack Sister   . Heart disease Sister   . Heart disease Brother        heart transplant    SOCIAL HISTORY: Social History   Tobacco Use  . Smoking status: Former Smoker    Packs/day: 0.50    Years: 46.00    Pack years: 23.00    Types: Cigarettes    Last attempt to quit: 11/12/2017    Years since quitting: 0.4  . Smokeless tobacco: Never Used  . Tobacco comment: 12 cigarettes per day.   Substance Use Topics  . Alcohol use: No    Alcohol/week: 0.0 oz    Comment: a12/17/20214 "last drink was 3-4 yr ago; never had problem w/it"    Allergies  Allergen Reactions  . Zetia [Ezetimibe]  Anaphylaxis and Swelling    Tongue and throat  Tongue and throat  Tongue and throat   . Pravastatin Sodium Other (See Comments)    Pravastatin 40 mg qday and 40 mg q M/W/F caused muscle aches Pravastatin 40 mg qday and 40 mg q M/W/F caused muscle aches Pravastatin 40 mg qday and 40 mg q M/W/F caused muscle aches  . Atorvastatin Other (See Comments) and Rash    Malaise & muscle weakness  . Crestor [Rosuvastatin] Other (See Comments) and Rash    Malaise & muscle weakness  . Lisinopril Rash  . Promethazine Rash    Current Outpatient Medications  Medication Sig Dispense Refill  . gabapentin (NEURONTIN) 300 MG capsule Take 300 mg by mouth 3 (three) times daily.    Marland Kitchen oxyCODONE (OXYCONTIN) 15 mg 12 hr tablet Take 15 mg by mouth every 12 (twelve) hours.    Marland Kitchen zolpidem (AMBIEN) 10 MG tablet Take 10 mg by mouth at bedtime.     No current facility-administered medications for this visit.     REVIEW OF SYSTEMS:  [X]  denotes positive finding, [ ]  denotes negative finding Cardiac  Comments:  Chest pain or chest pressure:    Shortness of breath upon exertion:    Short of breath when lying flat:    Irregular heart rhythm:  Vascular    Pain in calf, thigh, or hip brought on by ambulation:    Pain in feet at night that wakes you up from your sleep:     Blood clot in your veins:    Leg swelling:           PHYSICAL EXAM: Vitals:   05/12/18 0935  BP: (!) 171/93  Pulse: (!) 50  Resp: 16  Temp: 99.1 F (37.3 C)  TempSrc: Oral  SpO2: 97%  Weight: 145 lb (65.8 kg)  Height: 6\' 3"  (1.905 m)    GENERAL: The patient is a well-nourished male, in no acute distress. The vital signs are documented above. CARDIOVASCULAR: 2+ radial and 3+ femoral pulses bilaterally.  I do not palpate distal pulses. PULMONARY: There is good air exchange  MUSCULOSKELETAL: There are no major deformities or cyanosis. NEUROLOGIC: No focal weakness or paresthesias are detected. SKIN: There are no ulcers or  rashes noted. PSYCHIATRIC: The patient has a normal affect.  DATA:  Globin index 0.61 on the right and 0.53 on the left  MEDICAL ISSUES: Stable patent aortobifemoral bypass with asymptomatic occlusion of his left femoral-popliteal bypass.  We will continue his walking program.  We will see [place] again in 6 months with repeat ankle arm indices.  He knows to notify us immediately or present to [facility] emergency room for new onset of ischemia    Rosetta Posner, MD [facility] Tel [phone] Pager [phone]

## 2018-05-15 ENCOUNTER — Other Ambulatory Visit: Payer: Self-pay

## 2018-05-15 DIAGNOSIS — I779 Disorder of arteries and arterioles, unspecified: Secondary | ICD-10-CM

## 2018-06-19 ENCOUNTER — Encounter (HOSPITAL_COMMUNITY): Payer: Self-pay | Admitting: Emergency Medicine

## 2018-06-19 ENCOUNTER — Other Ambulatory Visit: Payer: Self-pay

## 2018-06-19 ENCOUNTER — Emergency Department (HOSPITAL_COMMUNITY)
Admission: EM | Admit: 2018-06-19 | Discharge: 2018-06-22 | Disposition: A | Discharge status: Home / Self Care | Payer: Medicaid Other | Attending: Emergency Medicine | Admitting: Emergency Medicine

## 2018-06-19 DIAGNOSIS — Z87891 Personal history of nicotine dependence: Secondary | ICD-10-CM | POA: Diagnosis not present

## 2018-06-19 DIAGNOSIS — R45851 Suicidal ideations: Secondary | ICD-10-CM | POA: Insufficient documentation

## 2018-06-19 DIAGNOSIS — I10 Essential (primary) hypertension: Secondary | ICD-10-CM | POA: Insufficient documentation

## 2018-06-19 DIAGNOSIS — Z79899 Other long term (current) drug therapy: Secondary | ICD-10-CM | POA: Diagnosis not present

## 2018-06-19 DIAGNOSIS — F1120 Opioid dependence, uncomplicated: Secondary | ICD-10-CM

## 2018-06-19 DIAGNOSIS — M7918 Myalgia, other site: Secondary | ICD-10-CM | POA: Diagnosis not present

## 2018-06-19 DIAGNOSIS — F329 Major depressive disorder, single episode, unspecified: Secondary | ICD-10-CM | POA: Insufficient documentation

## 2018-06-19 DIAGNOSIS — E119 Type 2 diabetes mellitus without complications: Secondary | ICD-10-CM | POA: Insufficient documentation

## 2018-06-19 LAB — COMPREHENSIVE METABOLIC PANEL
ALT: 16 U/L (ref 0–44)
ANION GAP: 10 (ref 5–15)
AST: 25 U/L (ref 15–41)
Albumin: 4.2 g/dL (ref 3.5–5.0)
Alkaline Phosphatase: 50 U/L (ref 38–126)
BUN: 16 mg/dL (ref 8–23)
CALCIUM: 10 mg/dL (ref 8.9–10.3)
CHLORIDE: 106 mmol/L (ref 98–111)
CO2: 22 mmol/L (ref 22–32)
CREATININE: 0.72 mg/dL (ref 0.61–1.24)
Glucose, Bld: 92 mg/dL (ref 70–99)
Potassium: 4.3 mmol/L (ref 3.5–5.1)
SODIUM: 138 mmol/L (ref 135–145)
Total Bilirubin: 0.6 mg/dL (ref 0.3–1.2)
Total Protein: 7.5 g/dL (ref 6.5–8.1)

## 2018-06-19 LAB — CBC WITH DIFFERENTIAL/PLATELET
Abs Immature Granulocytes: 0 10*3/uL (ref 0.0–0.1)
BASOS ABS: 0.1 10*3/uL (ref 0.0–0.1)
Basophils Relative: 1 %
EOS ABS: 0.2 10*3/uL (ref 0.0–0.7)
EOS PCT: 4 %
HEMATOCRIT: 47.4 % (ref 39.0–52.0)
Hemoglobin: 15 g/dL (ref 13.0–17.0)
IMMATURE GRANULOCYTES: 0 %
LYMPHS ABS: 2.4 10*3/uL (ref 0.7–4.0)
LYMPHS PCT: 44 %
MCH: 30.1 pg (ref 26.0–34.0)
MCHC: 31.6 g/dL (ref 30.0–36.0)
MCV: 95.2 fL (ref 78.0–100.0)
Monocytes Absolute: 0.6 10*3/uL (ref 0.1–1.0)
Monocytes Relative: 11 %
NEUTROS PCT: 40 %
Neutro Abs: 2.2 10*3/uL (ref 1.7–7.7)
Platelets: 119 10*3/uL — ABNORMAL LOW (ref 150–400)
RBC: 4.98 MIL/uL (ref 4.22–5.81)
RDW: 14 % (ref 11.5–15.5)
WBC: 5.4 10*3/uL (ref 4.0–10.5)

## 2018-06-19 LAB — ETHANOL: Alcohol, Ethyl (B): <10 mg/dL (ref <10)

## 2018-06-19 MED ORDER — METHOCARBAMOL 500 MG PO TABS
500.0000 mg | ORAL_TABLET | Freq: Three times a day (TID) | ORAL | Status: DC | PRN
  Administered 2018-06-19 – 2018-06-22 (×7): 500 mg via ORAL
  Filled 2018-06-19 (×8): qty 1

## 2018-06-19 MED ORDER — ZOLPIDEM TARTRATE 5 MG PO TABS
10.0000 mg | ORAL_TABLET | Freq: Every evening | ORAL | Status: DC | PRN
  Administered 2018-06-19: 10 mg via ORAL
  Filled 2018-06-19: qty 2

## 2018-06-19 MED ORDER — CLONIDINE HCL 0.2 MG PO TABS
0.1000 mg | ORAL_TABLET | Freq: Four times a day (QID) | ORAL | Status: AC
  Administered 2018-06-19 – 2018-06-21 (×6): 0.1 mg via ORAL
  Filled 2018-06-19 (×6): qty 1

## 2018-06-19 MED ORDER — CLONIDINE HCL 0.2 MG PO TABS: 0.1000 mg | ORAL_TABLET | Freq: Every day | ORAL | Status: DC

## 2018-06-19 MED ORDER — ONDANSETRON 4 MG PO TBDP
4.0000 mg | ORAL_TABLET | Freq: Four times a day (QID) | ORAL | Status: DC | PRN
  Administered 2018-06-20 – 2018-06-22 (×6): 4 mg via ORAL
  Filled 2018-06-19 (×6): qty 1

## 2018-06-19 MED ORDER — ACETAMINOPHEN 500 MG PO TABS
500.0000 mg | ORAL_TABLET | Freq: Four times a day (QID) | ORAL | Status: DC | PRN
  Administered 2018-06-19: 500 mg via ORAL
  Administered 2018-06-20 (×3): 1000 mg via ORAL
  Administered 2018-06-20: 500 mg via ORAL
  Administered 2018-06-21 (×2): 1000 mg via ORAL
  Administered 2018-06-21: 500 mg via ORAL
  Administered 2018-06-22 (×3): 1000 mg via ORAL
  Filled 2018-06-19: qty 2
  Filled 2018-06-19: qty 1
  Filled 2018-06-19 (×7): qty 2
  Filled 2018-06-19: qty 1
  Filled 2018-06-19: qty 2

## 2018-06-19 MED ORDER — DICYCLOMINE HCL 20 MG PO TABS
20.0000 mg | ORAL_TABLET | Freq: Four times a day (QID) | ORAL | Status: DC | PRN
  Administered 2018-06-20 – 2018-06-22 (×4): 20 mg via ORAL
  Filled 2018-06-19 (×5): qty 1

## 2018-06-19 MED ORDER — LOPERAMIDE HCL 2 MG PO CAPS: 2.0000 mg | ORAL_CAPSULE | ORAL | Status: DC | PRN

## 2018-06-19 MED ORDER — CLONIDINE HCL 0.2 MG PO TABS
0.1000 mg | ORAL_TABLET | Freq: Two times a day (BID) | ORAL | Status: DC
  Administered 2018-06-21 – 2018-06-22 (×2): 0.1 mg via ORAL
  Filled 2018-06-19 (×2): qty 1

## 2018-06-19 MED ORDER — HYDROXYZINE HCL 25 MG PO TABS
25.0000 mg | ORAL_TABLET | Freq: Four times a day (QID) | ORAL | Status: DC | PRN
  Administered 2018-06-19 – 2018-06-22 (×7): 25 mg via ORAL
  Filled 2018-06-19 (×7): qty 1

## 2018-06-19 MED ORDER — NAPROXEN 250 MG PO TABS
500.0000 mg | ORAL_TABLET | Freq: Two times a day (BID) | ORAL | Status: DC | PRN
  Administered 2018-06-20 – 2018-06-22 (×5): 500 mg via ORAL
  Filled 2018-06-19 (×4): qty 2

## 2018-06-19 NOTE — ED Notes (Signed)
TTS at bedside. 

## 2018-06-19 NOTE — ED Triage Notes (Addendum)
Pt arrives via POV from home with c/o needing help getting off of prescribed narcotics. States goes to pain management for oxycodone. Ran out of pain medication. Not due for refill for 3 days. States addiction began 2 years ago after abdomen surgery. States is suicidal now- with fleeting thoughts. Denies plan. Cooperative, calm. Last used 1 day ago. No support system.

## 2018-06-19 NOTE — ED Notes (Signed)
Pt given sandwich, orange juice, crackers and peanut better.

## 2018-06-19 NOTE — Progress Notes (Signed)
Contacted pt's nurse, Leda Gauze RN, to inform her that pt is being reviewed at [place]. Informed Leda Gauze RN that the [facility] was requesting a new set of vitals and a urine screen. Leda Gauze RN expressed an understanding.
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
## 2018-06-19 NOTE — ED Provider Notes (Signed)
[facility] EMERGENCY DEPARTMENT Provider Note   CSN: [id number] Arrival date & time: 06/19/18  1240     History   Chief Complaint Chief Complaint  Patient presents with  . Drug / Alcohol Assessment  . Suicidal    HPI [name] [name] is a 67 y.o. male with a past medical history of hypertension, diabetes who presents to ED for evaluation of suicidal ideations, requesting to detox from narcotic use.  States that he has been taking oxycodone and OxyContin daily for the past 2 years after having a surgery on his abdomen for an ulcer.  He reports some "fleeting suicidal thoughts."  Denies any prior history of suicide attempt.  Last took his pain medicine yesterday.  This is being prescribed by his PCP.  Denies any vomiting, diarrhea.  Does report generalized body aches.  Denies any HI, AVH.  HPI  Past Medical History:  Diagnosis Date  . Arthritis   . Diabetes mellitus without complication ([place])   . GERD (gastroesophageal reflux disease)   . Hypertension   . PAD (peripheral artery disease) [facility])     Patient Active Problem List   Diagnosis Date Noted  . PAD (peripheral artery disease) ([place]) 11/13/2017  . Mood disorder ([place]) 01/28/2016  . Depression 01/28/2016  . Acute blood loss anemia 01/06/2016  . Polyneuropathy 01/06/2016  . Hypomagnesemia 12/16/2015  . Demand ischemia on 1/20 from acute blood loss anemia ([place]) 12/16/2015  . Abdominal wall fluid collections   . Acute encephalopathy 12/12/2015  . Seizures ([place]) 12/12/2015  . Acute kidney injury ([place]) 12/12/2015  . Controlled diabetes mellitus with circulatory complication, without long-term current use of insulin ([place]) 12/12/2015  . Anemia of chronic disease 12/12/2015  . Sepsis ([place]) 12/12/2015  . Leukocytosis 12/12/2015  . Thrombocytopenia ([place]) 12/12/2015  . Palliative care encounter   . Acute respiratory failure with hypoxemia ([place]) 12/06/2015  . DVT of axillary vein, acute right ([place])   . Small  bowel obstruction & perforation s/p OR exploration & repair 12/01/2015 12/02/2015  . Purulent peritonitis ([place]) 12/02/2015  . Protein-calorie malnutrition, severe ([place]) 07/30/2015    Past Surgical History:  Procedure Laterality Date  . ABDOMINAL AORTAGRAM N/A 11/11/2011   Procedure: ABDOMINAL Maxcine Ham;  Surgeon: Angelia Mould, MD;  Location: [facility] CATH LAB;  Service: Cardiovascular;  Laterality: N/A;  . ABDOMINAL AORTOGRAM W/LOWER EXTREMITY N/A 11/13/2017   Procedure: ABDOMINAL AORTOGRAM W/LOWER EXTREMITY;  Surgeon: Serafina Mitchell, MD;  Location: [place] CV LAB;  Service: Cardiovascular;  Laterality: N/A;  . AORTA - BILATERAL FEMORAL ARTERY BYPASS GRAFT  11/25/2011   Procedure: AORTA BIFEMORAL BYPASS GRAFT;  Surgeon: Rosetta Posner, MD;  Location: [facility];  Service: Vascular;  Laterality: N/A;  . COLONOSCOPY    . FEMORAL-FEMORAL BYPASS GRAFT Right 11/14/2017   Procedure: REDO BYPASS  FEMORAL-FEMORAL ARTERY EXPOSURE;  Surgeon: Serafina Mitchell, MD;  Location: [facility];  Service: Vascular;  Laterality: Right;  . FEMORAL-POPLITEAL BYPASS GRAFT Left 07/28/2015   Procedure: LEFT FEMORAL-POPLITEAL ARTERY BYPASS GRAFT;  Surgeon: Rosetta Posner, MD;  Location: [facility];  Service: Vascular;  Laterality: Left;  . ILIAC ARTERY STENT  05/06/2011  . INGUINAL HERNIA REPAIR Right   . LAPAROTOMY N/A 12/01/2015   Procedure: EXPLORATORY LAPAROTOMY WITH RADICAL PERITONEAL DEBRIDEMENT, CLOSURE OF SMALL BOWEL PERFORATION AND LYSIS OF ADHESIONS.;  Surgeon: Johnathan Hausen, MD;  Location: [facility] ORS;  Service: General;  Laterality: N/A;  . LEFT HEART CATHETERIZATION WITH CORONARY ANGIOGRAM N/A 06/07/2013   Procedure:  LEFT HEART CATHETERIZATION WITH CORONARY ANGIOGRAM;  Surgeon: Sherren Mocha, MD;  Location[facility] CATH LAB;  Service: Cardiovascular;  Laterality: N/A;  . LEFT HEART CATHETERIZATION WITH CORONARY ANGIOGRAM N/A 12/10/2013   Procedure: LEFT HEART CATHETERIZATION WITH CORONARY ANGIOGRAM;  Surgeon: Blane Ohara, MD;   Location: [facility] CATH LAB;  Service: Cardiovascular;  Laterality: N/A;  . LOWER EXTREMITY ANGIOGRAM Bilateral 11/11/2011   Procedure: LOWER EXTREMITY ANGIOGRAM;  Surgeon: Angelia Mould, MD;  Location: [facility] CATH LAB;  Service: Cardiovascular;  Laterality: Bilateral;  . PATCH ANGIOPLASTY N/A 11/14/2017   Procedure: [place];  Surgeon: Serafina Mitchell, MD;  Location: [facility] OR;  Service: Vascular;  Laterality: N/A;  . PERCUTANEOUS CORONARY STENT INTERVENTION (PCI-S) Right 06/07/2013   Procedure: PERCUTANEOUS CORONARY STENT INTERVENTION (PCI-S);  Surgeon: Sherren Mocha, MD;  Location: [facility] CATH LAB;  Service: Cardiovascular;  Laterality: Right;  . PERIPHERAL VASCULAR CATHETERIZATION N/A 07/26/2015   Procedure: Abdominal Aortogram;  Surgeon: Serafina Mitchell, MD;  Location: [place] CV LAB;  Service: Cardiovascular;  Laterality: N/A;  . PERIPHERAL VASCULAR CATHETERIZATION Bilateral 07/26/2015   Procedure: Lower Extremity Angiography;  Surgeon: Serafina Mitchell, MD;  Location: [place] CV LAB;  Service: Cardiovascular;  Laterality: Bilateral;  . PR VEIN BYPASS GRAFT,AORTO-FEM-POP  11/24/2012  . THROMBECTOMY FEMORAL ARTERY N/A 11/14/2017   Procedure: THROMBECTOMY AORTA  BI-FEMORAL;  Surgeon: Serafina Mitchell, MD;  Location: [facility] OR;  Service: Vascular;  Laterality: N/A;  . TONSILLECTOMY    . ULTRASOUND GUIDANCE FOR VASCULAR ACCESS  11/13/2017   Procedure: Ultrasound Guidance For Vascular Access;  Surgeon: Serafina Mitchell, MD;  Location: [place] CV LAB;  Service: Cardiovascular;;        Home Medications    Prior to Admission medications   Medication Sig Start Date End Date Taking? Authorizing Provider  acetaminophen (TYLENOL) 500 MG tablet Take 500-1,000 mg by mouth every 6 (six) hours as needed (for headaches).   Yes [provider]  Oxycodone HCl 20 MG TABS Take 20 mg by mouth every 6 (six) hours.    Yes [provider]  zolpidem (AMBIEN)  10 MG tablet Take 10 mg by mouth at bedtime as needed for sleep.    Yes [provider]  sildenafil (REVATIO) 20 MG tablet TK 2 TO 5 TS PO ONE TO TWO H B  SEX PRN 05/19/18   [provider]    Family History Family History  Problem Relation Age of Onset  . Hypertension Mother   . Heart attack Mother   . Diabetes Mother   . Heart disease Mother   . Aneurysm Father   . Heart disease Father   . Heart attack Sister   . Heart disease Sister   . Heart disease Brother        heart transplant    Social History Social History   Tobacco Use  . Smoking status: Former Smoker    Packs/day: 0.50    Years: 46.00    Pack years: 23.00    Types: Cigarettes    Last attempt to quit: 11/12/2017    Years since quitting: 0.6  . Smokeless tobacco: Never Used  . Tobacco comment: 12 cigarettes per day.   Substance Use Topics  . Alcohol use: No    Alcohol/week: 0.0 oz    Comment: a12/17/20214 "last drink was 3-4 yr ago; never had problem w/it"  . Drug use: No    Frequency: 1.0 times per week  Types: Marijuana, Cocaine    Comment: stopped marijuana May 07, 2012, stopped cocaine 1980     Allergies   Ezetimibe; Pravastatin sodium; Atorvastatin; Crestor [rosuvastatin]; Lisinopril; and Promethazine   Review of Systems Review of Systems  Constitutional: Negative for appetite change, chills and fever.  HENT: Negative for ear pain, rhinorrhea, sneezing and sore throat.   Eyes: Negative for photophobia and visual disturbance.  Respiratory: Negative for cough, chest tightness, shortness of breath and wheezing.   Cardiovascular: Negative for chest pain and palpitations.  Gastrointestinal: Negative for abdominal pain, blood in stool, constipation, diarrhea, nausea and vomiting.  Genitourinary: Negative for dysuria, hematuria and urgency.  Musculoskeletal: Negative for myalgias.  Skin: Negative for rash.  Neurological: Negative for dizziness, weakness and light-headedness.    Psychiatric/Behavioral: Positive for suicidal ideas. The patient is nervous/anxious.      Physical Exam Updated Vital Signs BP (!) 158/102 (BP Location: Right Arm)   Pulse 66   Temp 98.4 F (36.9 C) (Oral)   Resp 18   Ht 6\' 3"  (1.905 m)   Wt 65.8 kg (145 lb)   SpO2 98%   BMI 18.12 kg/m   Physical Exam  Constitutional: He appears well-developed and well-nourished. No distress.  HENT:  Head: Normocephalic and atraumatic.  Nose: Nose normal.  Eyes: Conjunctivae and EOM are normal. Left eye exhibits no discharge. No scleral icterus.  Neck: Normal range of motion. Neck supple.  Cardiovascular: Normal rate, regular rhythm, normal heart sounds and intact distal pulses. Exam reveals no gallop and no friction rub.  No murmur heard. Pulmonary/Chest: Effort normal and breath sounds normal. No respiratory distress.  Abdominal: Soft. Bowel sounds are normal. He exhibits no distension. There is no tenderness. There is no guarding.  Musculoskeletal: Normal range of motion. He exhibits no edema.  Neurological: He is alert. He exhibits normal muscle tone. Coordination normal.  Skin: Skin is warm and dry. No rash noted.  Psychiatric: His affect is blunt. He expresses suicidal ideation. He expresses no homicidal ideation. He expresses no suicidal plans and no homicidal plans.  Nursing note and vitals reviewed.    ED Treatments / Results  Labs (all labs ordered are listed, but only abnormal results are displayed) Labs Reviewed  CBC WITH DIFFERENTIAL/PLATELET - Abnormal; Notable for the following components:      Result Value   Platelets 119 (*)    All other components within normal limits  COMPREHENSIVE METABOLIC PANEL  ETHANOL  RAPID URINE DRUG SCREEN, HOSP PERFORMED    EKG None  Radiology No results found.  Procedures Procedures (including critical care time)  Medications Ordered in ED Medications - No data to display   Initial Impression / Assessment and Plan / ED  Course  I have reviewed the triage vital signs and the nursing notes.  Pertinent labs & imaging results that were available during my care of the patient were reviewed by me and considered in my medical decision making (see chart for details).     67 year old male presents to ED for evaluation of suicidal ideations, requesting detox from daily opiate use.  He has been taking OxyContin and oxycodone as prescribed by his PCP daily for the past 2 years after an abdominal surgery.  He reports generalized body aches but denies any specific chest pain, abdominal pain, vomiting, fever, lightheadedness.  He does report suicidal thoughts associated with no specific plan.  Denies any HI, AVH.  Medical screening lab work is unremarkable. Patient is medically cleared for TTS evaluation.  Portions of this note were generated with [employer]ist. Dictation errors may occur despite best attempts at proofreading.  Final Clinical Impressions(s) / ED Diagnoses   Final diagnoses:  None    ED Discharge Orders    None       Delia Heady, PA-C 06/19/18 2205    Duffy Bruce, MD 06/22/18 [phone]

## 2018-06-19 NOTE — ED Notes (Signed)
Pt calm and cooperative requesting for meds to help to go sleep. Pt asked to provide urine sample needed for [facility] to complete inpatient referral. PT stated not able to provide urine sample, pt stated he had urinated when he was in POD E and specimen was  not collected. Pt provided with urinal.

## 2018-06-20 LAB — RAPID URINE DRUG SCREEN, HOSP PERFORMED
Amphetamines: NOT DETECTED
Barbiturates: NOT DETECTED
Benzodiazepines: NOT DETECTED
Cocaine: NOT DETECTED
OPIATES: NOT DETECTED
Tetrahydrocannabinol: POSITIVE — AB

## 2018-06-20 LAB — CBG MONITORING, ED: Glucose-Capillary: 153 mg/dL — ABNORMAL HIGH (ref 70–99)

## 2018-06-20 MED ORDER — LORAZEPAM 2 MG/ML IJ SOLN: 2.0000 mg | Freq: Once | INTRAMUSCULAR | Status: DC

## 2018-06-20 NOTE — ED Notes (Addendum)
Pt states he feels inspired after his friends from his church visited him. Pt ambulated to hallway. Pt noted w/shoes on that have shoe strings. Pt escorted back to his room and was asked by staff to remove them for safety reasons. Pt verbalized understanding - gave shoes to staff - Staff placed them w/his other belongings in locker 3 = 2 labeled belongings bags. Non-skid socks given.

## 2018-06-20 NOTE — ED Notes (Signed)
Dinner tray ordered.

## 2018-06-20 NOTE — Progress Notes (Signed)
Snack received.

## 2018-06-20 NOTE — Progress Notes (Signed)
ED CM reviewed patient's record awaiting [place] on disposition.

## 2018-06-20 NOTE — ED Notes (Signed)
Pt given hot packs per RN Wells Guiles

## 2018-06-20 NOTE — ED Notes (Signed)
Pt c/o pain continues - offered pt heat or ice packs - requested heat packs. Pt ambulated in hallway w/Sitter "to help w/pain".

## 2018-06-20 NOTE — ED Notes (Signed)
Pt resting quietly on bed watching tv.

## 2018-06-20 NOTE — ED Notes (Signed)
Pt sitting in recliner in room. Offered for pt to watch tv - declined.

## 2018-06-20 NOTE — ED Notes (Signed)
PA Upstill notified patient crying in pain, PA stated will come to evaluate pt.

## 2018-06-20 NOTE — ED Notes (Signed)
Regular Diet tray ordered for pt.

## 2018-06-20 NOTE — ED Notes (Addendum)
This nurse noted that clonidine tablet was supposed to be split in half. Pt. Given 0.2 mg of  Clonidine.Pt. Is A/O X4 and at baseline. Charge and Belif, MD made aware. Was given orders to watch pt. BP over the next couple of hours. Will continue to monitor.

## 2018-06-20 NOTE — ED Notes (Signed)
Visitors w/pt x 3.

## 2018-06-20 NOTE — ED Notes (Signed)
Bentyl given for c/o abd cramping. Pt sitting in recliner chair in room.

## 2018-06-21 LAB — CBG MONITORING, ED
GLUCOSE-CAPILLARY: 201 mg/dL — AB (ref 70–99)
Glucose-Capillary: 158 mg/dL — ABNORMAL HIGH (ref 70–99)

## 2018-06-21 NOTE — ED Notes (Addendum)
Manual pulse taken and was 42. Pt. Is alert and oriented X4 and asymptomatic.  Leonette Monarch, MD made aware. Was given verbal order to obtain 12- lead. Will continue to monitor.

## 2018-06-21 NOTE — ED Notes (Signed)
Pt OOB in hallway sitting in chair outside of room

## 2018-06-21 NOTE — ED Notes (Signed)
Patient states he has DM and has not had BS check;chart confirms patient has hx of DM-Tech to check BS-Monique,RN

## 2018-06-21 NOTE — ED Notes (Signed)
Patient was given DeCaf.Coke, Cookies and Crackers w/ Peanut Butter for Snack.

## 2018-06-21 NOTE — ED Notes (Signed)
Regular Diet  Was ordered for Dinner.

## 2018-06-21 NOTE — ED Triage Notes (Signed)
0815 Pt requesting meds for withdrawal . Pt reports HA ,nausea .

## 2018-06-21 NOTE — ED Notes (Addendum)
Ashok Cordia, MD made aware of heart rate of 44.  MD checked on pt. And told this nurse it is probably from him resting. Will continue to monitor.

## 2018-06-21 NOTE — ED Notes (Signed)
Pt is BIB

## 2018-06-21 NOTE — Progress Notes (Signed)
Patient meets criteria for inpatient treatment. There are currently no appropriate beds available at [facility] per [facility]. CSW faxed referrals to the following facilities for review.  [address], [place], [place], [place], [place], [place], [place], [place], [place], [place], [place], [place], [place], [place], and [place].   TTS will continue to seek bed placement.     Herbert Moors, MSW, LCSW, LCAS 06/21/2018 8:43 AM

## 2018-06-22 DIAGNOSIS — F1120 Opioid dependence, uncomplicated: Secondary | ICD-10-CM

## 2018-06-22 DIAGNOSIS — F1123 Opioid dependence with withdrawal: Secondary | ICD-10-CM

## 2018-06-22 MED ORDER — ACETAMINOPHEN 500 MG PO TABS: 1000.0000 mg | ORAL_TABLET | Freq: Three times a day (TID) | ORAL | 0 refills | Status: DC | PRN

## 2018-06-22 MED ORDER — METHOCARBAMOL 500 MG PO TABS: 500.0000 mg | ORAL_TABLET | Freq: Two times a day (BID) | ORAL | 0 refills | Status: DC

## 2018-06-22 MED ORDER — ONDANSETRON HCL 4 MG PO TABS: 4.0000 mg | ORAL_TABLET | Freq: Three times a day (TID) | ORAL | 0 refills | Status: DC | PRN

## 2018-06-22 NOTE — Progress Notes (Signed)
Patient is currently homeless and does not have access to medication at this time.  CSW requesting that patient be given one weeks worth of meds at d/c.  CSW spoke to [facility] ED Nurse, Luellen Pucker, and asked her to communicate request to [place].  [name] [name]. Judi Cong, MSW, [place] Disposition Clinical Social Work [phone] (cell) [phone] (office)

## 2018-06-22 NOTE — ED Notes (Signed)
Pt resting with no signs of distress at this time.

## 2018-06-22 NOTE — ED Notes (Signed)
Sitter gave pt breakfast tray. Pt now eating breakfast.

## 2018-06-22 NOTE — Progress Notes (Addendum)
Patient re-assessed by [place] Physician [place], Elmarie Shiley, [place].  Patient does not meet mental health inpatient criteria and is recommended for d/c with follow-up for detox treatment.  Referral sent to [facility]  Referral contact information faxed to [facility]  F for patient, prior to d/c. [employer] operator, Luellen Pucker, notified.  Areatha Keas. Judi Cong, MSW, [place] Disposition Clinical Social Work [phone] (cell) [phone] (office)

## 2018-06-22 NOTE — ED Notes (Signed)
Regular diet was ordered for breakfast. 

## 2018-06-22 NOTE — Consult Note (Addendum)
Telepsych Consultation   Reason for Consult: Suicidal ideation related to opioid dependence Referring Physician: EDP Location of Patient: [facility] Location of Provider: [place] Department  Patient Identification: [name] [name] MRN:  [id number] Principal Diagnosis: Opiate dependence [facility]) Diagnosis:   Patient Active Problem List   Diagnosis Date Noted  . Opiate dependence ([place]) [F11.20] 06/22/2018  . PAD (peripheral artery disease) ([place]) [I73.9] 11/13/2017  . Mood disorder ([place]) [F39] 01/28/2016  . Depression [F32.9] 01/28/2016  . Acute blood loss anemia [D62] 01/06/2016  . Polyneuropathy [G62.9] 01/06/2016  . Hypomagnesemia [E83.42] 12/16/2015  . Demand ischemia on 1/20 from acute blood loss anemia ([place]) [I24.8] 12/16/2015  . Abdominal wall fluid collections [R18.8]   . Acute encephalopathy [G93.40] 12/12/2015  . Seizures ([place]) [R56.9] 12/12/2015  . Acute kidney injury ([place]) [N17.9] 12/12/2015  . Controlled diabetes mellitus with circulatory complication, without long-term current use of insulin ([place]) [E11.59] 12/12/2015  . Anemia of chronic disease [D63.8] 12/12/2015  . Sepsis ([place]) [A41.9] 12/12/2015  . Leukocytosis [D72.829] 12/12/2015  . Thrombocytopenia ([place]) [D69.6] 12/12/2015  . Palliative care encounter [Z51.5]   . Acute respiratory failure with hypoxemia ([place]) [J96.01] 12/06/2015  . DVT of axillary vein, acute right ([place]) [I62.A11]   . Small bowel obstruction & perforation s/p OR exploration & repair 12/01/2015 [K63.1] 12/02/2015  . Purulent peritonitis ([place]) [K65.0] 12/02/2015  . Protein-calorie malnutrition, severe ([place]) [E43] 07/30/2015    Total Time spent with patient: 20 minutes  Subjective:   [name] [name] is a 67 y.o. male patient admitted with suicidal ideation after "running out of my pain medications. They were not lasting me long enough. Then I started feeling the withdrawal."  HPI:    Per initial Tele Assessment Note on 06/19/2018  by Windell Hummingbird, Counselor:   [name] [name] is a 67 y.o. male who arrived to [facility] in his POV hoping to be seen due to the depression he has been feeling. 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 Pt states he and his doctor had tried to reduce his medication but that pt was not able to be free of pain, so he used more of the medication than was prescribed, thus running out of the medication before the end of the month. Pt states he asked his doctor for refills but his doctor refused so [facility]. Pt states that he saw the people outside of the clinic this afternoon and that they scared him and that he did not want to end up like them, so he decided he wants to come off of the medication. Pt decided to come to the ED in hopes that he could go through withdrawals / detox and get help for his mental health.  Pt shares he has been having fleeting thoughts of suicide. He denies any intent, any plans or any previous attempts. He states he has never been hospitalized for mental health reasons with the exception of when he was 56 and his mother became aggravated and took him to the hospital asking for the doctor to do something with him. Pt states the doctor gave pt one pill and had pt stay overnight and that he and the doctor then laughed about it and the doctor sent pt home the next day. Pt has never had a therapist nor a psychiatrist. Pt shares he has been having fleeting thoughts of HI but has no  record of harming anyone, has no plan, and has no intent. Pt denies NSSIB and AVH.  Pt states he is divorced and that he lives alone. He denies any SA and denies any access to weapons. Pt denies any involvement with the court system. Pt shares he was VA by his mother when he was younger. He denies his family even experienced SI, MH, or SA. He states one of his religious brothers is his greatest support.  Pt states he  receives a check for disability. He states that, when he's feeling depressed, he has low self-esteem, insomnia, fatigue, and he feels guilty, irritable, and he isolates and spends more time in bed. Pt states he is able to complete his ADLs independently.  Pt was oriented x4. His recent and remote memory is intact. Pt was cooperative throughout the assessment. Pt's judgement and insight are fair; his impulse control is impaired at this time.   Per am psychiatric evaluation on 06/22/2018:  Patient pleasant and cooperative during the assessment. He denies any previous psychiatric history. Patient states "I am having withdrawal symptoms. I did not realize this would happen. I was using them too frequently because they were not lasting long enough. Now I am having nausea, sweats, body-aches, and my stomach is upset. I have been getting some medication to help with the symptoms. I don't really want to harm myself. I'm just worried about going home too early and not having any monitoring. I live alone. I have never tried to hurt myself before and I do not have a plan. I want to get off the opiate medications. I don't want something to control me like that." Patient denies any plan of self harm. [name] [name] expresses distress due to his present symptoms of opiate withdrawal. He is currently on the clonidine protocol to manage the symptoms. At this time the patient does not meet any criteria for inpatient psychiatric treatment. The social worker will initiate a referral to [facility] to address his desire to safely stop taking the opiate medications that he has been prescribed for the last three years.   Past Psychiatric History: Denies  Risk to Self: Suicidal Ideation: Yes-Currently Present Suicidal Intent: No Is patient at risk for suicide?: Yes Suicidal Plan?: No Access to Means: No What has been your use of drugs/alcohol within the last 12 months?: Pt has been abusing his prescription opioids How many times?:  0 Other Self Harm Risks: None noted Triggers for Past Attempts: Unknown Intentional Self Injurious Behavior: None Risk to Others: Homicidal Ideation: Denies as of 06/22/2018.  Thoughts of Harm to Others: Denies Comment - Thoughts of Harm to Others: Pt states he has "fleeting thoughts" of HI Current Homicidal Intent: No Current Homicidal Plan: No Access to Homicidal Means: No Identified Victim: None noted History of harm to others?: No Assessment of Violence: On admission Violent Behavior Description: None noted Does patient have access to weapons?: No(Pt denies) Criminal Charges Pending?: No Does patient have a court date: No Prior Inpatient Therapy: Prior Inpatient Therapy: No Prior Outpatient Therapy: Prior Outpatient Therapy: Yes Prior Therapy Dates: Once when pt was 67 years old Prior Therapy Facilty/Provider(s): Unknown Reason for Treatment: Pt's mother was upset wtih him, took him to the dr Does patient have an ACCT team?: No Does patient have Intensive In-House Services?  : No Does patient have [facility] services? : No Does patient have P4CC services?: No  Past Medical History:  Past Medical History:  Diagnosis Date  . Arthritis   .  Diabetes mellitus without complication ([place])   . GERD (gastroesophageal reflux disease)   . Hypertension   . PAD (peripheral artery disease) ([place])     Past Surgical History:  Procedure Laterality Date  . ABDOMINAL AORTAGRAM N/A 11/11/2011   Procedure: ABDOMINAL Maxcine Ham;  Surgeon: Angelia Mould, MD;  Location: [facility] CATH LAB;  Service: Cardiovascular;  Laterality: N/A;  . ABDOMINAL AORTOGRAM W/LOWER EXTREMITY N/A 11/13/2017   Procedure: ABDOMINAL AORTOGRAM W/LOWER EXTREMITY;  Surgeon: Serafina Mitchell, MD;  Location: [place] CV LAB;  Service: Cardiovascular;  Laterality: N/A;  . AORTA - BILATERAL FEMORAL ARTERY BYPASS GRAFT  11/25/2011   Procedure: AORTA BIFEMORAL BYPASS GRAFT;  Surgeon: Rosetta Posner, MD;  Location: [place];   Service: Vascular;  Laterality: N/A;  . COLONOSCOPY    . FEMORAL-FEMORAL BYPASS GRAFT Right 11/14/2017   Procedure: REDO BYPASS  FEMORAL-FEMORAL ARTERY EXPOSURE;  Surgeon: Serafina Mitchell, MD;  Location: [place];  Service: Vascular;  Laterality: Right;  . FEMORAL-POPLITEAL BYPASS GRAFT Left 07/28/2015   Procedure: LEFT FEMORAL-POPLITEAL ARTERY BYPASS GRAFT;  Surgeon: Rosetta Posner, MD;  Location: [place];  Service: Vascular;  Laterality: Left;  . ILIAC ARTERY STENT  05/06/2011  . INGUINAL HERNIA REPAIR Right   . LAPAROTOMY N/A 12/01/2015   Procedure: EXPLORATORY LAPAROTOMY WITH RADICAL PERITONEAL DEBRIDEMENT, CLOSURE OF SMALL BOWEL PERFORATION AND LYSIS OF ADHESIONS.;  Surgeon: Johnathan Hausen, MD;  Location: [facility] ORS;  Service: General;  Laterality: N/A;  . LEFT HEART CATHETERIZATION WITH CORONARY ANGIOGRAM N/A 06/07/2013   Procedure: LEFT HEART CATHETERIZATION WITH CORONARY ANGIOGRAM;  Surgeon: Sherren Mocha, MD;  Location: [facility] CATH LAB;  Service: Cardiovascular;  Laterality: N/A;  . LEFT HEART CATHETERIZATION WITH CORONARY ANGIOGRAM N/A 12/10/2013   Procedure: LEFT HEART CATHETERIZATION WITH CORONARY ANGIOGRAM;  Surgeon: Blane Ohara, MD;  Location: [facility] CATH LAB;  Service: Cardiovascular;  Laterality: N/A;  . LOWER EXTREMITY ANGIOGRAM Bilateral 11/11/2011   Procedure: LOWER EXTREMITY ANGIOGRAM;  Surgeon: Angelia Mould, MD;  Location: [facility] CATH LAB;  Service: Cardiovascular;  Laterality: Bilateral;  . PATCH ANGIOPLASTY N/A 11/14/2017   Procedure: [place];  Surgeon: Serafina Mitchell, MD;  Location: [facility] OR;  Service: Vascular;  Laterality: N/A;  . PERCUTANEOUS CORONARY STENT INTERVENTION (PCI-S) Right 06/07/2013   Procedure: PERCUTANEOUS CORONARY STENT INTERVENTION (PCI-S);  Surgeon: Sherren Mocha, MD;  Location: [facility] CATH LAB;  Service: Cardiovascular;  Laterality: Right;  . PERIPHERAL VASCULAR CATHETERIZATION N/A 07/26/2015   Procedure: Abdominal Aortogram;   Surgeon: Serafina Mitchell, MD;  Location: [place] CV LAB;  Service: Cardiovascular;  Laterality: N/A;  . PERIPHERAL VASCULAR CATHETERIZATION Bilateral 07/26/2015   Procedure: Lower Extremity Angiography;  Surgeon: Serafina Mitchell, MD;  Location: [place] CV LAB;  Service: Cardiovascular;  Laterality: Bilateral;  . PR VEIN BYPASS GRAFT,AORTO-FEM-POP  11/24/2012  . THROMBECTOMY FEMORAL ARTERY N/A 11/14/2017   Procedure: THROMBECTOMY AORTA  BI-FEMORAL;  Surgeon: Serafina Mitchell, MD;  Location: [facility] OR;  Service: Vascular;  Laterality: N/A;  . TONSILLECTOMY    . ULTRASOUND GUIDANCE FOR VASCULAR ACCESS  11/13/2017   Procedure: Ultrasound Guidance For Vascular Access;  Surgeon: Serafina Mitchell, MD;  Location: [place] CV LAB;  Service: Cardiovascular;;   Family History:  Family History  Problem Relation Age of Onset  . Hypertension Mother   . Heart attack Mother   . Diabetes Mother   . Heart disease Mother   . Aneurysm Father   . Heart disease Father   .  Heart attack Sister   . Heart disease Sister   . Heart disease Brother        heart transplant   Family Psychiatric  History: Denies Social History:  Social History   Substance and Sexual Activity  Alcohol Use No  . Alcohol/week: 0.0 oz   Comment: a12/17/20214 "last drink was 3-4 yr ago; never had problem w/it"     Social History   Substance and Sexual Activity  Drug Use No  . Frequency: 1.0 times per week  . Types: Marijuana, Cocaine   Comment: stopped marijuana May 07, 2012, stopped cocaine 1980    Social History   Socioeconomic History  . Marital status: Single    Spouse name: Not on file  . Number of children: Not on file  . Years of education: Not on file  . Highest education level: Not on file  Occupational History  . Not on file  Social Needs  . Financial resource strain: Not on file  . Food insecurity:    Worry: Not on file    Inability: Not on file  . Transportation needs:    Medical: Not on file     Non-medical: Not on file  Tobacco Use  . Smoking status: Former Smoker    Packs/day: 0.50    Years: 46.00    Pack years: 23.00    Types: Cigarettes    Last attempt to quit: 11/12/2017    Years since quitting: 0.6  . Smokeless tobacco: Never Used  . Tobacco comment: 12 cigarettes per day.   Substance and Sexual Activity  . Alcohol use: No    Alcohol/week: 0.0 oz    Comment: a12/17/20214 "last drink was 3-4 yr ago; never had problem w/it"  . Drug use: No    Frequency: 1.0 times per week    Types: Marijuana, Cocaine    Comment: stopped marijuana May 07, 2012, stopped cocaine 1980  . Sexual activity: Not Currently  Lifestyle  . Physical activity:    Days per week: Not on file    Minutes per session: Not on file  . Stress: Not on file  Relationships  . Social connections:    Talks on phone: Not on file    Gets together: Not on file    Attends religious service: Not on file    Active member of club or organization: Not on file    Attends meetings of clubs or organizations: Not on file    Relationship status: Not on file  Other Topics Concern  . Not on file  Social History Narrative  . Not on file   Additional Social History:    Allergies:   Allergies  Allergen Reactions  . Ezetimibe Anaphylaxis and Swelling    Tongue and throat swell  . Pravastatin Sodium Other (See Comments)    "Pravastatin 40 mg qday and 40 mg q M/W/F caused muscle aches"   . Atorvastatin Other (See Comments) and Rash    Malaise & muscle weakness  . Crestor [Rosuvastatin] Rash and Other (See Comments)    Malaise & muscle weakness  . Lisinopril Rash  . Promethazine Rash    Labs:  Results for orders placed or performed during the hospital encounter of 06/19/18 (from the past 48 hour(s))  CBG monitoring, ED     Status: Abnormal   Collection Time: 06/21/18  6:18 AM  Result Value Ref Range   Glucose-Capillary 201 (H) 70 - 99 mg/dL  CBG monitoring, ED     Status: Abnormal  Collection Time:  06/21/18  8:38 AM  Result Value Ref Range   Glucose-Capillary 158 (H) 70 - 99 mg/dL    Medications:  Current Facility-Administered Medications  Medication Dose Route Frequency Provider Last Rate Last Dose  . acetaminophen (TYLENOL) tablet 500-1,000 mg  500-1,000 mg Oral Q6H PRN Khatri, Hina, PA-C   1,000 mg at 06/22/18 0731  . cloNIDine (CATAPRES) tablet 0.1 mg  0.1 mg Oral BID Khatri, Hina, PA-C   0.1 mg at 06/22/18 5701   Followed by  . [START ON 06/24/2018] cloNIDine (CATAPRES) tablet 0.1 mg  0.1 mg Oral Daily Khatri, Hina, PA-C      . dicyclomine (BENTYL) tablet 20 mg  20 mg Oral Q6H PRN Khatri, Hina, PA-C   20 mg at 06/22/18 0041  . hydrOXYzine (ATARAX/VISTARIL) tablet 25 mg  25 mg Oral Q6H PRN Khatri, Hina, PA-C   25 mg at 06/21/18 2309  . loperamide (IMODIUM) capsule 2-4 mg  2-4 mg Oral PRN Khatri, Hina, PA-C      . methocarbamol (ROBAXIN) tablet 500 mg  500 mg Oral Q8H PRN Khatri, Hina, PA-C   500 mg at 06/21/18 2108  . naproxen (NAPROSYN) tablet 500 mg  500 mg Oral BID PRN Khatri, Hina, PA-C   500 mg at 06/22/18 0733  . ondansetron (ZOFRAN-ODT) disintegrating tablet 4 mg  4 mg Oral Q6H PRN Khatri, Hina, PA-C   4 mg at 06/21/18 1849   Current Outpatient Medications  Medication Sig Dispense Refill  . acetaminophen (TYLENOL) 500 MG tablet Take 500 mg by mouth every 6 (six) hours as needed (for headaches).     . Oxycodone HCl 20 MG TABS Take 20 mg by mouth every 6 (six) hours.     . sildenafil (REVATIO) 20 MG tablet Take 40-100 mg by mouth 1-2 hours prior to activity as needed for E.D.  11  . zolpidem (AMBIEN) 10 MG tablet Take 10 mg by mouth at bedtime as needed for sleep.       Musculoskeletal:  Unable to assess via camera  Psychiatric Specialty Exam: Physical Exam  Psychiatric: His speech is normal and behavior is normal. Judgment and thought content normal. Cognition and memory are normal. He exhibits a depressed mood.    Review of Systems  Psychiatric/Behavioral: Positive  for depression and substance abuse (Has been overusing his opiates due to chronic pain problems).    Blood pressure (!) 145/86, pulse (!) 44, temperature 97.8 F (36.6 C), temperature source Oral, resp. rate 20, height 6\' 3"  (1.905 m), weight 65.8 kg (145 lb), SpO2 100 %.Body mass index is 18.12 kg/m.  General Appearance: Casual  Eye Contact:  Good  Speech:  Clear and Coherent  Volume:  Normal  Mood:  Depressed  Affect:  Appropriate  Thought Process:  Coherent and Goal Directed  Orientation:  Full (Time, Place, and Person)  Thought Content:  WDL  Suicidal Thoughts:  Yes.  without intent/plan appears to be related to current opiate withdrawal symptoms  Homicidal Thoughts:  No  Memory:  Immediate;   Good Recent;   Good Remote;   Good  Judgement:  Good  Insight:  Present  Psychomotor Activity:  Normal  Concentration:  Concentration: Good and Attention Span: Good  Recall:  Good  Fund of Knowledge:  Good  Language:  Good  Akathisia:  No  Handed:  Right  AIMS (if indicated):     Assets:  Communication Skills Desire for Improvement Financial Resources/Insurance Housing Leisure Time Resilience Talents/Skills  ADL's:  Intact  Cognition:  WNL  Sleep:        Treatment Plan Summary: Plan Discharge home with referral to [facility] initiated by [facility]TS social worker.   Disposition: No evidence of imminent risk to self or others at present.   Patient does not meet criteria for psychiatric inpatient admission. Discussed crisis plan, support from social network, calling 911, coming to the Emergency Department, and calling Suicide Hotline.  This service was provided via telemedicine using a 2-way, interactive audio and video technology.  Names of all persons participating in this telemedicine service and their role in this encounter. Name: Elmarie Shiley  Role: PMHNP-C  Name: Jule Ser Role: Patient  Name:  Role:   Name:  Role:     Elmarie Shiley, NP 06/22/2018 10:16 AM

## 2018-06-22 NOTE — ED Notes (Signed)
Declined W/C at D/C and was escorted to lobby by RN. 

## 2018-06-22 NOTE — ED Notes (Signed)
Pt c/o of pain. RN notified.

## 2018-06-22 NOTE — Discharge Instructions (Addendum)
You were evaluated at the [facility] emergency Department.  After careful evaluation, we did not find any emergent condition requiring admission or further testing in the hospital.  Please follow-up with [facility] and use the prescriptions provided as needed.  Please return to the Emergency Department if you experience any worsening of your condition.  We encourage you to follow up with a primary care provider.  Thank you for allowing [place] to be a part of your care.

## 2018-06-24 ENCOUNTER — Emergency Department (HOSPITAL_COMMUNITY): Payer: Medicare Other

## 2018-06-24 ENCOUNTER — Encounter (HOSPITAL_COMMUNITY): Payer: Self-pay

## 2018-06-24 ENCOUNTER — Emergency Department (HOSPITAL_COMMUNITY)
Admission: EM | Admit: 2018-06-24 | Discharge: 2018-06-24 | Disposition: A | Discharge status: Home / Self Care | Payer: Medicare Other | Attending: Emergency Medicine | Admitting: Emergency Medicine

## 2018-06-24 DIAGNOSIS — I739 Peripheral vascular disease, unspecified: Secondary | ICD-10-CM

## 2018-06-24 DIAGNOSIS — M79661 Pain in right lower leg: Secondary | ICD-10-CM | POA: Insufficient documentation

## 2018-06-24 DIAGNOSIS — M79672 Pain in left foot: Secondary | ICD-10-CM | POA: Diagnosis not present

## 2018-06-24 DIAGNOSIS — I10 Essential (primary) hypertension: Secondary | ICD-10-CM | POA: Insufficient documentation

## 2018-06-24 DIAGNOSIS — Z79899 Other long term (current) drug therapy: Secondary | ICD-10-CM | POA: Insufficient documentation

## 2018-06-24 DIAGNOSIS — M79671 Pain in right foot: Secondary | ICD-10-CM | POA: Diagnosis not present

## 2018-06-24 DIAGNOSIS — M79605 Pain in left leg: Secondary | ICD-10-CM | POA: Diagnosis present

## 2018-06-24 DIAGNOSIS — Z87891 Personal history of nicotine dependence: Secondary | ICD-10-CM | POA: Diagnosis not present

## 2018-06-24 DIAGNOSIS — E119 Type 2 diabetes mellitus without complications: Secondary | ICD-10-CM | POA: Insufficient documentation

## 2018-06-24 LAB — I-STAT CHEM 8, ED
BUN: 17 mg/dL (ref 8–23)
Calcium, Ion: 1.32 mmol/L (ref 1.15–1.40)
Chloride: 103 mmol/L (ref 98–111)
Creatinine, Ser: 0.7 mg/dL (ref 0.61–1.24)
Glucose, Bld: 112 mg/dL — ABNORMAL HIGH (ref 70–99)
HEMATOCRIT: 46 % (ref 39.0–52.0)
HEMOGLOBIN: 15.6 g/dL (ref 13.0–17.0)
POTASSIUM: 4.2 mmol/L (ref 3.5–5.1)
Sodium: 139 mmol/L (ref 135–145)
TCO2: 27 mmol/L (ref 22–32)

## 2018-06-24 LAB — CK: CK TOTAL: 224 U/L (ref 49–397)

## 2018-06-24 MED ORDER — IOPAMIDOL (ISOVUE-370) INJECTION 76%
INTRAVENOUS | Status: AC
  Filled 2018-06-24: qty 100

## 2018-06-24 MED ORDER — KETOROLAC TROMETHAMINE 30 MG/ML IJ SOLN
15.0000 mg | Freq: Once | INTRAMUSCULAR | Status: AC
  Administered 2018-06-24: 15 mg via INTRAVENOUS
  Filled 2018-06-24: qty 1

## 2018-06-24 MED ORDER — IOPAMIDOL (ISOVUE-370) INJECTION 76%
100.0000 mL | Freq: Once | INTRAVENOUS | Status: AC | PRN
  Administered 2018-06-24: 100 mL via INTRAVENOUS

## 2018-06-24 NOTE — ED Provider Notes (Signed)
[facility] EMERGENCY DEPARTMENT Provider Note   CSN: [id number] Arrival date & time: 06/24/18  0127     History   Chief Complaint Chief Complaint  Patient presents with  . Leg Pain    HPI [name] [name] is a 67 y.o. male.  Patient with history of peripheral vascular disease status post stents and multiple vascular procedures to revascularize his lower legs.  Presents with bilateral foot and leg pain for the past several days.  He believes that "1 of my grafts is clotted".  He believes his pulse and his right femoral groin is decreased compared to baseline.  His last surgery was in December.  He does not take any blood thinners.  He describes pain in both of his feet and calves bilaterally which is progressively worsening.  Notably he was recently in detox for opiate dependence and is no longer using opiates.  He denies any chest pain or shortness of breath.  He denies any weakness in his legs.  He denies any numbness or tingling.  The history is provided by the patient.  Leg Pain      Past Medical History:  Diagnosis Date  . Arthritis   . Diabetes mellitus without complication ([place])   . GERD (gastroesophageal reflux disease)   . Hypertension   . PAD (peripheral artery disease) [facility])     Patient Active Problem List   Diagnosis Date Noted  . Opiate dependence ([place]) 06/22/2018  . PAD (peripheral artery disease) ([place]) 11/13/2017  . Mood disorder ([place]) 01/28/2016  . Depression 01/28/2016  . Acute blood loss anemia 01/06/2016  . Polyneuropathy 01/06/2016  . Hypomagnesemia 12/16/2015  . Demand ischemia on 1/20 from acute blood loss anemia ([place]) 12/16/2015  . Abdominal wall fluid collections   . Acute encephalopathy 12/12/2015  . Seizures ([place]) 12/12/2015  . Acute kidney injury ([place]) 12/12/2015  . Controlled diabetes mellitus with circulatory complication, without long-term current use of insulin ([place]) 12/12/2015  . Anemia of chronic disease 12/12/2015    . Sepsis ([place]) 12/12/2015  . Leukocytosis 12/12/2015  . Thrombocytopenia ([place]) 12/12/2015  . Palliative care encounter   . Acute respiratory failure with hypoxemia ([place]) 12/06/2015  . DVT of axillary vein, acute right ([place])   . Small bowel obstruction & perforation s/p OR exploration & repair 12/01/2015 12/02/2015  . Purulent peritonitis ([place]) 12/02/2015  . Protein-calorie malnutrition, severe ([place]) 07/30/2015    Past Surgical History:  Procedure Laterality Date  . ABDOMINAL AORTAGRAM N/A 11/11/2011   Procedure: ABDOMINAL Maxcine Ham;  Surgeon: Angelia Mould, MD;  Location[facility] C[facility] LAB;  Service: Cardiovascular;  Laterality: N/A;  . ABDOMINAL AORTOGRAM W/LOWER EXTREMITY N/A 11/13/2017   Procedure: ABDOMINAL AORTOGRAM W/LOWER EXTREMITY;  Surgeon: Serafina Mitchell, MD;  Location: [facility] CV LAB;  Service: Cardiovascular;  Laterality: N/A;  . AORTA - BILATERAL FEMORAL ARTERY BYPASS GRAFT  11/25/2011   Procedure: AORTA BIFEMORAL BYPASS GRAFT;  Surgeon: Rosetta Posner, MD;  Location: [facility];  Service: Vascular;  Laterality: N/A;  . COLONOSCOPY    . FEMORAL-FEMORAL BYPASS GRAFT Right 11/14/2017   Procedure: REDO BYPASS  FEMORAL-FEMORAL ARTERY EXPOSURE;  Surgeon: Serafina Mitchell, MD;  Location: [facility];  Service: Vascular;  Laterality: Right;  . FEMORAL-POPLITEAL BYPASS GRAFT Left 07/28/2015   Procedure: LEFT FEMORAL-POPLITEAL ARTERY BYPASS GRAFT;  Surgeon: Rosetta Posner, MD;  Location: [facility];  Service: Vascular;  Laterality: Left;  . ILIAC ARTERY STENT  05/06/2011  . INGUINAL HERNIA REPAIR Right   .  LAPAROTOMY N/A 12/01/2015   Procedure: EXPLORATORY LAPAROTOMY WITH RADICAL PERITONEAL DEBRIDEMENT, CLOSURE OF SMALL BOWEL PERFORATION AND LYSIS OF ADHESIONS.;  Surgeon: Johnathan Hausen, MD;  Location: [facility] ORS;  Service: General;  Laterality: N/A;  . LEFT HEART CATHETERIZATION WITH CORONARY ANGIOGRAM N/A 06/07/2013   Procedure: LEFT HEART CATHETERIZATION WITH CORONARY ANGIOGRAM;  Surgeon: Sherren Mocha, MD;  Location: [facility] CATH LAB;  Service: Cardiovascular;  Laterality: N/A;  . LEFT HEART CATHETERIZATION WITH CORONARY ANGIOGRAM N/A 12/10/2013   Procedure: LEFT HEART CATHETERIZATION WITH CORONARY ANGIOGRAM;  Surgeon: Blane Ohara, MD;  Location[facility] CATH LAB;  Service: Cardiovascular;  Laterality: N/A;  . LOWER EXTREMITY ANGIOGRAM Bilateral 11/11/2011   Procedure: LOWER EXTREMITY ANGIOGRAM;  Surgeon: Angelia Mould, MD;  Location: [facility] CATH LAB;  Service: Cardiovascular;  Laterality: Bilateral;  . PATCH ANGIOPLASTY N/A 11/14/2017   Procedure: [place] [employer] [place];  Surgeon: Serafina Mitchell, MD;  Location: [facility] OR;  Service: Vascular;  Laterality: N/A;  . PERCUTANEOUS CORONARY STENT INTERVENTION (PCI-S) Right 06/07/2013   Procedure: PERCUTANEOUS CORONARY STENT INTERVENTION (PCI-S);  Surgeon: Sherren Mocha, MD;  Location: [facility] CATH LAB;  Service: Cardiovascular;  Laterality: Right;  . PERIPHERAL VASCULAR CATHETERIZATION N/A 07/26/2015   Procedure: Abdominal Aortogram;  Surgeon: Serafina Mitchell, MD;  Location: [facility] CV LAB;  Service: Cardiovascular;  Laterality: N/A;  . PERIPHERAL VASCULAR CATHETERIZATION Bilateral 07/26/2015   Procedure: Lower Extremity Angiography;  Surgeon: Serafina Mitchell, MD;  Location: [facility] CV LAB;  Service: Cardiovascular;  Laterality: Bilateral;  . PR VEIN BYPASS GRAFT,AORTO-FEM-POP  11/24/2012  . THROMBECTOMY FEMORAL ARTERY N/A 11/14/2017   Procedure: THROMBECTOMY AORTA  BI-FEMORAL;  Surgeon: Serafina Mitchell, MD;  Location: [facility] OR;  Service: Vascular;  Laterality: N/A;  . TONSILLECTOMY    . ULTRASOUND GUIDANCE FOR VASCULAR ACCESS  11/13/2017   Procedure: Ultrasound Guidance For Vascular Access;  Surgeon: Serafina Mitchell, MD;  Location: [facility] CV LAB;  Service: Cardiovascular;;        Home Medications    Prior to Admission medications   Medication Sig Start Date End Date Taking? Authorizing Provider  acetaminophen  (TYLENOL) 500 MG tablet Take 500 mg by mouth every 6 (six) hours as needed (for headaches).     [provider]  acetaminophen (TYLENOL) 500 MG tablet Take 2 tablets (1,000 mg total) by mouth every 8 (eight) hours as needed for mild pain. 06/22/18   Maudie Flakes, MD  methocarbamol (ROBAXIN) 500 MG tablet Take 1 tablet (500 mg total) by mouth 2 (two) times daily. 06/22/18   Maudie Flakes, MD  ondansetron (ZOFRAN) 4 MG tablet Take 1 tablet (4 mg total) by mouth every 8 (eight) hours as needed for nausea or vomiting. 06/22/18   Maudie Flakes, MD  Oxycodone HCl 20 MG TABS Take 20 mg by mouth every 6 (six) hours.     [provider]  sildenafil (REVATIO) 20 MG tablet Take 40-100 mg by mouth 1-2 hours prior to activity as needed for E.D. 05/19/18   [provider]  zolpidem (AMBIEN) 10 MG tablet Take 10 mg by mouth at bedtime as needed for sleep.     [provider]    Family History Family History  Problem Relation Age of Onset  . Hypertension Mother   . Heart attack Mother   . Diabetes Mother   . Heart disease Mother   . Aneurysm Father   . Heart disease Father   . Heart attack  Sister   . Heart disease Sister   . Heart disease Brother        heart transplant    Social History Social History   Tobacco Use  . Smoking status: Former Smoker    Packs/day: 0.50    Years: 46.00    Pack years: 23.00    Types: Cigarettes    Last attempt to quit: 11/12/2017    Years since quitting: 0.6  . Smokeless tobacco: Never Used  . Tobacco comment: 12 cigarettes per day.   Substance Use Topics  . Alcohol use: No    Alcohol/week: 0.0 oz    Comment: a12/17/20214 "last drink was 3-4 yr ago; never had problem w/it"  . Drug use: No    Frequency: 1.0 times per week    Types: Marijuana, Cocaine    Comment: stopped marijuana May 07, 2012, stopped cocaine 1980     Allergies   Ezetimibe; Pravastatin sodium; Atorvastatin; Crestor [rosuvastatin]; Lisinopril; and  Promethazine   Review of Systems Review of Systems  Constitutional: Negative for activity change, appetite change and fever.  HENT: Negative for congestion and sinus pain.   Respiratory: Negative for cough, chest tightness and shortness of breath.   Cardiovascular: Negative for chest pain and leg swelling.  Gastrointestinal: Negative for abdominal pain, nausea and vomiting.  Genitourinary: Negative for dysuria and hematuria.  Musculoskeletal: Positive for arthralgias and myalgias.  Skin: Negative for wound.  Neurological: Negative for dizziness, tremors and weakness.    all other systems are negative except as noted in the HPI and PMH.    Physical Exam Updated Vital Signs BP 134/90   Pulse 86   Temp 98 F (36.7 C) (Oral)   Resp 20   SpO2 100%   Physical Exam  Constitutional: He is oriented to person, place, and time. He appears well-developed and well-nourished. No distress.  HENT:  Head: Normocephalic and atraumatic.  Mouth/Throat: Oropharynx is clear and moist. No oropharyngeal exudate.  Eyes: Pupils are equal, round, and reactive to light. Conjunctivae and EOM are normal.  Neck: Normal range of motion. Neck supple.  No meningismus.  Cardiovascular: Normal rate, regular rhythm, normal heart sounds and intact distal pulses.  No murmur heard. Femoral pulses palpable and equal.  Pulmonary/Chest: Effort normal and breath sounds normal. No respiratory distress.  Abdominal: Soft. There is no tenderness. There is no rebound and no guarding.  Musculoskeletal: Normal range of motion. He exhibits no edema or tenderness.  Feet are warm bilaterally.  Monophasic PT pulses present bilaterally with Doppler.  Full range of motion of bilateral hips, knees and ankles.  Compartments are soft.  There is no asymmetry or significant swelling of his legs.  Neurological: He is alert and oriented to person, place, and time. No cranial nerve deficit. He exhibits normal muscle tone. Coordination  normal.  No ataxia on finger to nose bilaterally. No pronator drift. 5/5 strength throughout. CN 2-12 intact.Equal grip strength. Sensation intact.   Skin: Skin is warm.  Psychiatric: He has a normal mood and affect. His behavior is normal.  Nursing note and vitals reviewed.    ED Treatments / Results  Labs (all labs ordered are listed, but only abnormal results are displayed) Labs Reviewed  I-STAT CHEM 8, ED - Abnormal; Notable for the following components:      Result Value   Glucose, Bld 112 (*)    All other components within normal limits  CK    EKG None  Radiology No results found.  Procedures  Procedures (including critical care time)  Medications Ordered in ED Medications - No data to display   Initial Impression / Assessment and Plan / ED Course  I have reviewed the triage vital signs and the nursing notes.  Pertinent labs & imaging results that were available during my care of the patient were reviewed by me and considered in my medical decision making (see chart for details).    Patient with peripheral vascular disease and concerned there is a problem with his grafts and stents.  He does have dopplerable pulses in his feet and his feet are warm.  CK normal. Electrolytes normal. Patient does not want pain medication.  CT angios results discussed with Dr. Scot Dock of vascular surgery.  SFA occlusions appear to be chronic.  There is intact two-vessel runoff to each foot.  Femoral anastomosis aneurysms noted.   Dr. Scot Dock agrees with no acute intervention needed.  Patient is safe to follow-up with his primary vascular surgeon. Patient is ambulatory.  Advised to followup with Dr. Donnetta Hutching this week. Return to the ED with worsening pain, weakness, numbness, temperature or color change in feet or any other concerns. Final Clinical Impressions(s) / ED Diagnoses   Final diagnoses:  Claudication of both lower extremities [facility])    ED Discharge Orders    None         Ezequiel Essex, MD 06/24/18 [phone]

## 2018-06-24 NOTE — Consult Note (Signed)
Patient name: [name] [name] MRN: [id number] DOB: 1951-03-12 Sex: male  REASON FOR VISIT:   Pain in both feet and no right femoral pulse.  HPI:   [name] [name] is a pleasant 67 y.o. male who is undergone previous aortobifemoral bypass grafting by Dr. Sherren Mocha Early in 2012.  He was last seen in June by Dr. Donnetta Hutching and was doing well.  He was to return in 1 year.  He apparently called the office yesterday and was unable to get through.  He called last night about 1 AM concerned that he could not feel a pulse in the right groin and that he was having burning pain in his feet.  I instructed him to come to the emergency department.  He was evaluated by the emergency department physician and a CT angiogram was ordered which showed that his aortobifemoral bypass graft was patent.  The patient tells me that he could not feel a pulse in the right groin which is why he was concerned.  He describes burning pain in both feet which is likely neuropathy given his history of diabetes.  I do not get any significant history of claudication, rest pain, or nonhealing ulcers.  He does continue to smoke a pack per day of cigarettes.  No current facility-administered medications for this encounter.    Current Outpatient Medications  Medication Sig Dispense Refill  . acetaminophen (TYLENOL) 500 MG tablet Take 500 mg by mouth every 6 (six) hours as needed (for headaches).     Marland Kitchen acetaminophen (TYLENOL) 500 MG tablet Take 2 tablets (1,000 mg total) by mouth every 8 (eight) hours as needed for mild pain. 30 tablet 0  . methocarbamol (ROBAXIN) 500 MG tablet Take 1 tablet (500 mg total) by mouth 2 (two) times daily. 30 tablet 0  . ondansetron (ZOFRAN) 4 MG tablet Take 1 tablet (4 mg total) by mouth every 8 (eight) hours as needed for nausea or vomiting. 30 tablet 0  . Oxycodone HCl 20 MG TABS Take 20 mg by mouth every 6 (six) hours.     . sildenafil (REVATIO) 20 MG tablet Take 40-100 mg by mouth 1-2 hours prior to  activity as needed for E.D.  11  . zolpidem (AMBIEN) 10 MG tablet Take 10 mg by mouth at bedtime as needed for sleep.       REVIEW OF SYSTEMS:  [X]  denotes positive finding, [ ]  denotes negative finding Vascular    Leg swelling    Cardiac    Chest pain or chest pressure:    Shortness of breath upon exertion:    Short of breath when lying flat:    Irregular heart rhythm:    Constitutional    Fever or chills:     PHYSICAL EXAM:   Vitals:   06/24/18 0415 06/24/18 0530 06/24/18 0615 06/24/18 0639  BP: 138/81 137/80 (!) 119/101 (!) 176/105  Pulse: (!) 51 (!) 58 61 66  Resp:    16  Temp:      TempSrc:      SpO2: 100% 99% 97% 99%    GENERAL: The patient is a well-nourished male, in no acute distress. The vital signs are documented above. CARDIOVASCULAR: There is a regular rate and rhythm. PULMONARY: There is good air exchange bilaterally without wheezing or rales. VASCULAR: He has palpable femoral pulses.  He is warm feet with Doppler signals in the dorsalis pedis and posterior tibial positions bilaterally. The left femoral pulse is slightly prominent.  DATA:  CT ANGIOGRAM: I have reviewed his CT angiogram which shows that his aortobifemoral bypass graft is patent.  He has chronic bilateral superficial femoral artery occlusions and a chronically occluded left femoropopliteal bypass graft.  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  MEDICAL ISSUES:   BURNING PAIN BOTH FEET: His burning pain in his feet is likely secondary to neuropathy.  I have reassured him that his aortobifemoral bypass graft is patent.  We have discussed the importance of tobacco cessation.  Given the dilatation of the femoral anastomoses I have recommended a follow-up visit with Dr. Sherren Mocha Early in 3 months with a duplex of both groins at that time.  Deitra Mayo [facility] [phone]

## 2018-06-24 NOTE — ED Triage Notes (Signed)
Pt states that over the past few days he has been leg pain in both legs, pt states he bilateral femoral stents and states he thinks he does not have a pulse on R side. Pt has strong femoral pulse and weak pedals bilateral, pt very anxious. Legs warm and dry

## 2018-06-24 NOTE — Discharge Instructions (Signed)
Your testing today shows that your graft in your legs are open and there is no acute blockage.  Follow-up with Dr. Donnetta Hutching for an appointment this week.  Return to the ED if you develop new or worsening symptoms.

## 2018-06-26 ENCOUNTER — Telehealth: Payer: Self-pay | Admitting: Vascular Surgery

## 2018-06-26 NOTE — Telephone Encounter (Signed)
Sched. Appt. Spoke with pt. Mailed letter 09/23/18(wed.) 3pm Groin pseud. 4pm f/u MD

## 2018-06-29 ENCOUNTER — Other Ambulatory Visit: Payer: Self-pay

## 2018-06-29 DIAGNOSIS — I724 Aneurysm of artery of lower extremity: Secondary | ICD-10-CM

## 2018-09-22 ENCOUNTER — Ambulatory Visit (INDEPENDENT_AMBULATORY_CARE_PROVIDER_SITE_OTHER): Payer: Medicare Other | Admitting: Vascular Surgery

## 2018-09-22 ENCOUNTER — Ambulatory Visit (HOSPITAL_COMMUNITY)
Admission: RE | Admit: 2018-09-22 | Discharge: 2018-09-22 | Disposition: A | Discharge status: Home / Self Care | Payer: Medicare Other | Source: Ambulatory Visit | Attending: Internal Medicine | Admitting: Internal Medicine

## 2018-09-22 ENCOUNTER — Other Ambulatory Visit: Payer: Self-pay

## 2018-09-22 ENCOUNTER — Encounter: Payer: Self-pay | Admitting: Vascular Surgery

## 2018-09-22 VITALS — BP 153/103 | HR 62 | Resp 18 | Ht 75.0 in | Wt 145.8 lb

## 2018-09-22 DIAGNOSIS — I739 Peripheral vascular disease, unspecified: Secondary | ICD-10-CM | POA: Diagnosis not present

## 2018-09-22 DIAGNOSIS — I779 Disorder of arteries and arterioles, unspecified: Secondary | ICD-10-CM

## 2018-09-22 DIAGNOSIS — I724 Aneurysm of artery of lower extremity: Secondary | ICD-10-CM | POA: Insufficient documentation

## 2018-09-22 NOTE — Progress Notes (Signed)
[facility] [place]  Patient name: [name] [name] MRN: [id number] DOB: 1951-09-25 Sex: male  REASON FOR VISIT: Follow-up diffuse peripheral vascular occlusive disease  HPI: [name] [name] is a 67 y.o. male status post aortobifemoral bypass for aortic occlusion.  Subsequently underwent left femoral to popliteal bypass for extensive gangrene over the dorsum of his left foot.  This was all in the remote past.  He healed his left foot wound.  He has on one occasion had thrombosis of his right limb of his aortofemoral bypass and had thrombectomy of this.  He presented with concern regarding not being able to feel the pulse in his right groin in July.  He underwent CT angiogram which revealed widely patent aortofemoral bypass.  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  He did have ectasia of his iliac anastomoses bilaterally and is seen today to rule out any progression of false aneurysms.  He has no claudication symptoms.  He is not having any lower extremity tissue loss.  He does have inability to gain weight having undergone surgery for bowel obstruction and open abdomen several years ago as well  Past Medical History:  Diagnosis Date  . Arthritis   . Diabetes mellitus without complication ([place])   . GERD (gastroesophageal reflux disease)   . Hypertension   . PAD (peripheral artery disease) ([facility])     Family History  Problem Relation Age of Onset  . Hypertension Mother   . Heart attack Mother   . Diabetes Mother   . Heart disease Mother   . Aneurysm Father   . Heart disease Father   . Heart attack Sister   . Heart disease Sister   . Heart disease Brother        heart transplant    SOCIAL HISTORY: Social History   Tobacco Use  . Smoking status: Former Smoker    Packs/day: 0.50    Years: 46.00    Pack years: 23.00   Types: Cigarettes    Last attempt to quit: 11/12/2017    Years since quitting: 0.8  . Smokeless tobacco: Never Used  . Tobacco comment: 12 cigarettes per day.   Substance Use Topics  . Alcohol use: No    Alcohol/week: 0.0 standard drinks    Comment: a12/17/20214 "last drink was 3-4 yr ago; never had problem w/it"    Allergies  Allergen Reactions  . Ezetimibe Anaphylaxis and Swelling    Tongue and throat swell  . Pravastatin Sodium Other (See Comments)    "Pravastatin 40 mg qday and 40 mg q M/W/F caused muscle aches"   . Atorvastatin Other (See Comments) and Rash    Malaise & muscle weakness  . Crestor [Rosuvastatin] Rash and Other (See Comments)    Malaise & muscle weakness  . Lisinopril Rash  . Promethazine Rash    Current Outpatient Medications  Medication Sig Dispense Refill  . acetaminophen (TYLENOL) 500 MG tablet Take 500 mg by mouth every 6 (six) hours as needed (for headaches).     Marland Kitchen acetaminophen (TYLENOL) 500 MG tablet Take 2 tablets (1,000 mg total) by mouth every 8 (eight) hours as needed for mild pain. (Patient not taking: Reported on 09/22/2018) 30 tablet 0  . methocarbamol (ROBAXIN) 500 MG tablet Take 1 tablet (500 mg total) by mouth 2 (two) times daily. (Patient not taking: Reported on 09/22/2018) 30 tablet 0  . ondansetron (ZOFRAN)  4 MG tablet Take 1 tablet (4 mg total) by mouth every 8 (eight) hours as needed for nausea or vomiting. (Patient not taking: Reported on 09/22/2018) 30 tablet 0  . Oxycodone HCl 20 MG TABS Take 20 mg by mouth every 6 (six) hours.     . sildenafil (REVATIO) 20 MG tablet Take 40-100 mg by mouth 1-2 hours prior to activity as needed for E.D.  11  . zolpidem (AMBIEN) 10 MG tablet Take 10 mg by mouth at bedtime as needed for sleep.      No current facility-administered medications for this visit.     REVIEW OF SYSTEMS:  [X]  denotes positive finding, [ ]  denotes negative finding Cardiac  Comments:  Chest pain or chest pressure:      Shortness of breath upon exertion:    Short of breath when lying flat:    Irregular heart rhythm:        Vascular    Pain in calf, thigh, or hip brought on by ambulation:    Pain in feet at night that wakes you up from your sleep:     Blood clot in your veins:    Leg swelling:           PHYSICAL EXAM: Vitals:   09/22/18 1600 09/22/18 1602  BP: (!) 150/100 (!) 153/103  Pulse: 62   Resp: 18   SpO2: 96%   Weight: 145 lb 12.8 oz (66.1 kg)   Height: 6\' 3"  (1.905 m)     GENERAL: The patient is a well-nourished male, in no acute distress. The vital signs are documented above. CARDIOVASCULAR: Plus radial and 2+ femoral pulses bilaterally.  He has a well-healed groin incisions and full pulsation bilaterally.  Distal pulses palpable PULMONARY: There is good air exchange  MUSCULOSKELETAL: There are no major deformities or cyanosis. NEUROLOGIC: No focal weakness or paresthesias are detected. SKIN: There are no ulcers or rashes noted. PSYCHIATRIC: The patient has a normal affect.  DATA:  Duplex today shows a maximal diameter of approximately 1.8 and 1.9 cm in the right and left groin respectively  MEDICAL ISSUES: Stable follow-up with extensive history of arterial occlusive disease.  Will notify should he develop any ischemic changes.  Otherwise will be seen again in 1 year with ankle arm index and bilateral femoral duplex    Rosetta Posner, MD [facility] Tel [phone] Pager [phone]

## 2018-09-23 ENCOUNTER — Ambulatory Visit: Payer: Medicare Other | Admitting: Vascular Surgery

## 2018-09-23 ENCOUNTER — Encounter (HOSPITAL_COMMUNITY): Payer: Medicare Other

## 2018-10-27 ENCOUNTER — Encounter (HOSPITAL_COMMUNITY): Payer: Medicare Other

## 2018-10-27 ENCOUNTER — Ambulatory Visit: Payer: Medicare Other | Admitting: Vascular Surgery

## 2018-12-02 ENCOUNTER — Ambulatory Visit: Payer: Self-pay | Admitting: Cardiology

## 2018-12-02 DIAGNOSIS — I10 Essential (primary) hypertension: Secondary | ICD-10-CM | POA: Insufficient documentation

## 2018-12-02 DIAGNOSIS — I2510 Atherosclerotic heart disease of native coronary artery without angina pectoris: Secondary | ICD-10-CM | POA: Insufficient documentation

## 2018-12-02 NOTE — Progress Notes (Deleted)
Cardiology Office Note:    Date:  12/02/2018   ID:  [name] [name], DOB 1951/04/14, MRN [id number]  PCP:  Kristopher Glee., MD  Cardiologist:  No primary care provider on file.  Referring MD: Kristopher Glee., MD   No chief complaint on file. ***  History of Present Illness:    [name] [name] is a 68 y.o. male with a past medical history significant for CAD with NSTEMI 2014 and PCI to the RCA with chronic occlusion of the left circumflex/OM1 branch treated medically.  Other medical problems include HTN, HLD, diabetes, seizure disorder and PAD s/p aorto bifemoral bypass and left femoral-popliteal bypass grafting.  He was hospitalized in 10/2017 with occlusion of the right limb of his aorto bifemoral bypass as well as his left femoral-popliteal bypass graft.  He underwent successful thrombectomy.  Chronic narcotics   [name] [name] is here today for annual follow-up  Past Medical History:  Diagnosis Date  . Arthritis   . Diabetes mellitus without complication ([place])   . GERD (gastroesophageal reflux disease)   . Hypertension   . PAD (peripheral artery disease) ([place])     Past Surgical History:  Procedure Laterality Date  . ABDOMINAL AORTAGRAM N/A 11/11/2011   Procedure: ABDOMINAL Maxcine Ham;  Surgeon: Angelia Mould, MD;  Location: [facility] CATH LAB;  Service: Cardiovascular;  Laterality: N/A;  . ABDOMINAL AORTOGRAM W/LOWER EXTREMITY N/A 11/13/2017   Procedure: ABDOMINAL AORTOGRAM W/LOWER EXTREMITY;  Surgeon: Serafina Mitchell, MD;  Location: [place] CV LAB;  Service: Cardiovascular;  Laterality: N/A;  . AORTA - BILATERAL FEMORAL ARTERY BYPASS GRAFT  11/25/2011   Procedure: AORTA BIFEMORAL BYPASS GRAFT;  Surgeon: Rosetta Posner, MD;  Location: [place];  Service: Vascular;  Laterality: N/A;  . COLONOSCOPY    . FEMORAL-FEMORAL BYPASS GRAFT Right 11/14/2017   Procedure: REDO BYPASS  FEMORAL-FEMORAL ARTERY EXPOSURE;  Surgeon: Serafina Mitchell, MD;  Location: [place];  Service: Vascular;   Laterality: Right;  . FEMORAL-POPLITEAL BYPASS GRAFT Left 07/28/2015   Procedure: LEFT FEMORAL-POPLITEAL ARTERY BYPASS GRAFT;  Surgeon: Rosetta Posner, MD;  Location: [place];  Service: Vascular;  Laterality: Left;  . ILIAC ARTERY STENT  05/06/2011  . INGUINAL HERNIA REPAIR Right   . LAPAROTOMY N/A 12/01/2015   Procedure: EXPLORATORY LAPAROTOMY WITH RADICAL PERITONEAL DEBRIDEMENT, CLOSURE OF SMALL BOWEL PERFORATION AND LYSIS OF ADHESIONS.;  Surgeon: Johnathan Hausen, MD;  [facility]: [facility];  Service: General;  Laterality: N/A;  . LEFT HEART CATHETERIZATION WITH CORONARY ANGIOGRAM N/A 06/07/2013   Procedure: LEFT HEART CATHETERIZATION WITH CORONARY ANGIOGRAM;  Surgeon: Sherren Mocha, MD;  Location: [facility] CATH LAB;  Service: Cardiovascular;  Laterality: N/A;  . LEFT HEART CATHETERIZATION WITH CORONARY ANGIOGRAM N/A 12/10/2013   Procedure: LEFT HEART CATHETERIZATION WITH CORONARY ANGIOGRAM;  Surgeon: Blane Ohara, MD;  Location: [facility] CATH LAB;  Service: Cardiovascular;  Laterality: N/A;  . LOWER EXTREMITY ANGIOGRAM Bilateral 11/11/2011   Procedure: LOWER EXTREMITY ANGIOGRAM;  Surgeon: Angelia Mould, MD;  Location: [facility] CATH LAB;  Service: Cardiovascular;  Laterality: Bilateral;  . PATCH ANGIOPLASTY N/A 11/14/2017   Procedure: [place];  Surgeon: Serafina Mitchell, MD;  [facility]: [facility] OR;  Service: Vascular;  Laterality: N/A;  . PERCUTANEOUS CORONARY STENT INTERVENTION (PCI-S) Right 06/07/2013   Procedure: PERCUTANEOUS CORONARY STENT INTERVENTION (PCI-S);  Surgeon: Sherren Mocha, MD;  Location: [facility] CATH LAB;  Service: Cardiovascular;  Laterality: Right;  . PERIPHERAL VASCULAR CATHETERIZATION N/A 07/26/2015   Procedure: Abdominal Aortogram;  Surgeon:  Serafina Mitchell, MD;  Location: [place] CV LAB;  Service: Cardiovascular;  Laterality: N/A;  . PERIPHERAL VASCULAR CATHETERIZATION Bilateral 07/26/2015   Procedure: Lower Extremity Angiography;  Surgeon: Serafina Mitchell, MD;   Location: [place] CV LAB;  Service: Cardiovascular;  Laterality: Bilateral;  . PR VEIN BYPASS GRAFT,AORTO-FEM-POP  11/24/2012  . THROMBECTOMY FEMORAL ARTERY N/A 11/14/2017   Procedure: THROMBECTOMY AORTA  BI-FEMORAL;  Surgeon: Serafina Mitchell, MD;  Location[facility];  Service: Vascular;  Laterality: N/A;  . TONSILLECTOMY    . ULTRASOUND GUIDANCE FOR VASCULAR ACCESS  11/13/2017   Procedure: Ultrasound Guidance For Vascular Access;  Surgeon: Serafina Mitchell, MD;  Location: [place] CV LAB;  Service: Cardiovascular;;    Current Medications: No outpatient medications have been marked as taking for the 12/02/18 encounter (Appointment) with Daune Perch, NP.     Allergies:   Ezetimibe; Pravastatin sodium; Atorvastatin; Crestor [rosuvastatin]; Lisinopril; and Promethazine   Social History   Socioeconomic History  . Marital status: Single    Spouse name: Not on file  . Number of children: Not on file  . Years of education: Not on file  . Highest education level: Not on file  Occupational History  . Not on file  Social Needs  . Financial resource strain: Not on file  . Food insecurity:    Worry: Not on file    Inability: Not on file  . Transportation needs:    Medical: Not on file    Non-medical: Not on file  Tobacco Use  . Smoking status: Former Smoker    Packs/day: 0.50    Years: 46.00    Pack years: 23.00    Types: Cigarettes    Last attempt to quit: 11/12/2017    Years since quitting: 1.0  . Smokeless tobacco: Never Used  . Tobacco comment: 12 cigarettes per day.   Substance and Sexual Activity  . Alcohol use: No    Alcohol/week: 0.0 standard drinks    Comment: a12/17/20214 "last drink was 3-4 yr ago; never had problem w/it"  . Drug use: No    Frequency: 1.0 times per week    Types: Marijuana, Cocaine    Comment: stopped marijuana May 07, 2012, stopped cocaine 1980  . Sexual activity: Not Currently  Lifestyle  . Physical activity:    Days per week: Not on  file    Minutes per session: Not on file  . Stress: Not on file  Relationships  . Social connections:    Talks on phone: Not on file    Gets together: Not on file    Attends religious service: Not on file    Active member of club or organization: Not on file    Attends meetings of clubs or organizations: Not on file    Relationship status: Not on file  Other Topics Concern  . Not on file  Social History Narrative  . Not on file     Family History: The patient's family history includes Aneurysm in his father; Diabetes in his mother; Heart attack in his mother and sister; Heart disease in his brother, father, mother, and sister; Hypertension in his mother. ROS:   Please see the history of present illness.     All other systems reviewed and are negative.  EKGs/Labs/Other Studies Reviewed:    The following studies were reviewed today: ***  EKG:  EKG is *** ordered today.  The ekg ordered today demonstrates ***  Recent Labs: 06/19/2018: ALT 16; Platelets 119  06/24/2018: BUN 17; Creatinine, Ser 0.70; Hemoglobin 15.6; Potassium 4.2; Sodium 139   Recent Lipid Panel    Component Value Date/Time   CHOL 154 11/24/2015 0445   TRIG 115 12/25/2015 0450   HDL 36 (L) 11/24/2015 0445   CHOLHDL 4.3 11/24/2015 0445   VLDL 22 11/24/2015 0445   LDLCALC 96 11/24/2015 0445    Physical Exam:    VS:  There were no vitals taken for this visit.    Wt Readings from Last 3 Encounters:  09/22/18 145 lb 12.8 oz (66.1 kg)  06/19/18 145 lb (65.8 kg)  05/12/18 145 lb (65.8 kg)     Physical Exam***   ASSESSMENT:    No diagnosis found. PLAN:    In order of problems listed above:  1.  CAD: Hx NSTEMI with PCI to RCA and chronic occlusion of the left circumflex/OM1 branch.  Patient has declined medical therapy for secondary prevention of CAD/hypertension/hyperlipidemia.  2.  Hypertension: Patient has declined treatment in the past  3.  Peripheral artery disease: Followed by Dr. early.   Hospitalized in 10/2017 for thrombus of his peripheral bypass graft, successfully treated.  4.  Severe protein calorie malnutrition: Is on Glucerna  5.  Type 2 diabetes on insulin with meals: Management per primary care  6.  Hyperlipidemia: Patient has declined medical therapy for lipids.  He is working on his diet.  Medication Adjustments/Labs and Tests Ordered: Current medicines are reviewed at length with the patient today.  Concerns regarding medicines are outlined above. Labs and tests ordered and medication changes are outlined in the patient instructions below:  There are no Patient Instructions on file for this visit.   Signed, Daune Perch, NP  12/02/2018 5:12 AM    [place]

## 2019-03-18 ENCOUNTER — Telehealth: Payer: Self-pay | Admitting: *Deleted

## 2019-03-18 NOTE — Telephone Encounter (Signed)
lmovm to call back and move appointment from May 4th

## 2019-03-18 NOTE — Telephone Encounter (Signed)
This pts name is not on vm and is speaking in a different language. Will try later.

## 2019-03-18 NOTE — Telephone Encounter (Signed)
Follow up   Pt is returning call and he doesn't want a virtual visit and wants an office visit    Please call

## 2019-03-19 NOTE — Telephone Encounter (Signed)
Left message for patient to call back and speak with me - put him through to my cell [phone]

## 2019-03-25 ENCOUNTER — Telehealth: Payer: Self-pay | Admitting: Cardiovascular Disease

## 2019-03-25 NOTE — Progress Notes (Signed)
Virtual Visit via Telephone Note   This visit type was conducted due to national recommendations for restrictions regarding the COVID-19 Pandemic (e.g. social distancing) in an effort to limit this patient's exposure and mitigate transmission in our community.  Due to his co-morbid illnesses, this patient is at least at moderate risk for complications without adequate follow up.  This format is felt to be most appropriate for this patient at this time.  The patient did not have access to video technology/had technical difficulties with video requiring transitioning to audio format only (telephone).  All issues noted in this document were discussed and addressed.  No physical exam could be performed with this format.  Please refer to the patient's chart for his  consent to telehealth for [facility].   Date:  03/26/2019   ID:  [name] [name], DOB 05-26-1951, MRN [id number]  Patient Location: Home Provider Location: Home  PCP:  Kristopher Glee., MD  Cardiologist:  Sherren Mocha, MD   Electrophysiologist:  None  VVS:  Dr. Donnetta Hutching  Evaluation Performed:  Follow-Up Visit  Chief Complaint:  FU on CAD  History of Present Illness:    [name] [name] is a 68 y.o. male with CAD s/p NSTEMI tx with Promus DES to mid RCA (CTO of OM1 tx medically), ischemic CM with EF 35-40%, PAD s/p aortobifemoral bypass, HTN, HL, DM, seizure d/o.  EF has returned to normal.  Cardiac Catheterization in 2015 demonstrated patent RCA stent, CTO of OM1 and no significant CAD elsewhere.  Med Rx was continued.  He had a prolonged hospitalization in 12/2015 due to perforated small bowel.  He was last seen by Dr. Burt Knack in 01/2017.  In 10/2017, he underwent thrombectomy of the R limb of the aortobifemoral bypass and patch angioplasty of the common femoral and profunda femoral artery.    Today, he notes he has chest pain that comes and goes.  He has had this for years without any change.  He has chronic dyspnea on exertion that  is unchanged.  He has not had paroxysmal nocturnal dyspnea, syncope, leg swelling.    The patient does not have symptoms concerning for COVID-19 infection (fever, chills, cough, or new shortness of breath).    Past Medical History:  Diagnosis Date  . Arthritis   . Diabetes mellitus without complication ([place])   . GERD (gastroesophageal reflux disease)   . Hypertension   . PAD (peripheral artery disease) ([place])    Past Surgical History:  Procedure Laterality Date  . ABDOMINAL AORTAGRAM N/A 11/11/2011   Procedure: ABDOMINAL Maxcine Ham;  Surgeon: Angelia Mould, MD;  Location: [facility] CATH LAB;  Service: Cardiovascular;  Laterality: N/A;  . ABDOMINAL AORTOGRAM W/LOWER EXTREMITY N/A 11/13/2017   Procedure: ABDOMINAL AORTOGRAM W/LOWER EXTREMITY;  Surgeon: Serafina Mitchell, MD;  Location: [place] CV LAB;  Service: Cardiovascular;  Laterality: N/A;  . AORTA - BILATERAL FEMORAL ARTERY BYPASS GRAFT  11/25/2011   Procedure: AORTA BIFEMORAL BYPASS GRAFT;  Surgeon: Rosetta Posner, MD;  Location: [place];  Service: Vascular;  Laterality: N/A;  . COLONOSCOPY    . FEMORAL-FEMORAL BYPASS GRAFT Right 11/14/2017   Procedure: REDO BYPASS  FEMORAL-FEMORAL ARTERY EXPOSURE;  Surgeon: Serafina Mitchell, MD;  Location: [place];  Service: Vascular;  Laterality: Right;  . FEMORAL-POPLITEAL BYPASS GRAFT Left 07/28/2015   Procedure: LEFT FEMORAL-POPLITEAL ARTERY BYPASS GRAFT;  Surgeon: Rosetta Posner, MD;  Location: [place];  Service: Vascular;  Laterality: Left;  . ILIAC ARTERY STENT  05/06/2011  .  INGUINAL HERNIA REPAIR Right   . LAPAROTOMY N/A 12/01/2015   Procedure: EXPLORATORY LAPAROTOMY WITH RADICAL PERITONEAL DEBRIDEMENT, CLOSURE OF SMALL BOWEL PERFORATION AND LYSIS OF ADHESIONS.;  Surgeon: Johnathan Hausen, MD;  Location: [facility];  Service: General;  Laterality: N/A;  . LEFT HEART CATHETERIZATION WITH CORONARY ANGIOGRAM N/A 06/07/2013   Procedure: LEFT HEART CATHETERIZATION WITH CORONARY ANGIOGRAM;  Surgeon: Sherren Mocha, MD;  Location: [facility] CATH LAB;  Service: Cardiovascular;  Laterality: N/A;  . LEFT HEART CATHETERIZATION WITH CORONARY ANGIOGRAM N/A 12/10/2013   Procedure: LEFT HEART CATHETERIZATION WITH CORONARY ANGIOGRAM;  Surgeon: Blane Ohara, MD;  Location: [facility] CATH LAB;  Service: Cardiovascular;  Laterality: N/A;  . LOWER EXTREMITY ANGIOGRAM Bilateral 11/11/2011   Procedure: LOWER EXTREMITY ANGIOGRAM;  Surgeon: Angelia Mould, MD;  Location: [facility] CATH LAB;  Service: Cardiovascular;  Laterality: Bilateral;  . PATCH ANGIOPLASTY N/A 11/14/2017   Procedure: [place];  Surgeon: Serafina Mitchell, MD;  Location: [facility] OR;  Service: Vascular;  Laterality: N/A;  . PERCUTANEOUS CORONARY STENT INTERVENTION (PCI-S) Right 06/07/2013   Procedure: PERCUTANEOUS CORONARY STENT INTERVENTION (PCI-S);  Surgeon: Sherren Mocha, MD;  Location: [facility] CATH LAB;  Service: Cardiovascular;  Laterality: Right;  . PERIPHERAL VASCULAR CATHETERIZATION N/A 07/26/2015   Procedure: Abdominal Aortogram;  Surgeon: Serafina Mitchell, MD;  Location: [place] CV LAB;  Service: Cardiovascular;  Laterality: N/A;  . PERIPHERAL VASCULAR CATHETERIZATION Bilateral 07/26/2015   Procedure: Lower Extremity Angiography;  Surgeon: Serafina Mitchell, MD;  Location: [place] CV LAB;  Service: Cardiovascular;  Laterality: Bilateral;  . PR VEIN BYPASS GRAFT,AORTO-FEM-POP  11/24/2012  . THROMBECTOMY FEMORAL ARTERY N/A 11/14/2017   Procedure: THROMBECTOMY AORTA  BI-FEMORAL;  Surgeon: Serafina Mitchell, MD;  Location: [facility] OR;  Service: Vascular;  Laterality: N/A;  . TONSILLECTOMY    . ULTRASOUND GUIDANCE FOR VASCULAR ACCESS  11/13/2017   Procedure: Ultrasound Guidance For Vascular Access;  Surgeon: Serafina Mitchell, MD;  Location: [place] CV LAB;  Service: Cardiovascular;;     Current Meds  Medication Sig  . acetaminophen (TYLENOL) 500 MG tablet Take 500 mg by mouth every 6 (six) hours as needed (for headaches).    . zolpidem (AMBIEN) 10 MG tablet Take 10 mg by mouth at bedtime as needed for sleep.      Allergies:   Ezetimibe; Atorvastatin; Crestor [rosuvastatin]; Lisinopril; Pravastatin sodium; and Promethazine   Social History   Tobacco Use  . Smoking status: Former Smoker    Packs/day: 0.50    Years: 46.00    Pack years: 23.00    Types: Cigarettes    Last attempt to quit: 11/12/2017    Years since quitting: 1.3  . Smokeless tobacco: Never Used  . Tobacco comment: 12 cigarettes per day.   Substance Use Topics  . Alcohol use: No    Alcohol/week: 0.0 standard drinks    Comment: a12/17/20214 "last drink was 3-4 yr ago; never had problem w/it"  . Drug use: No    Frequency: 1.0 times per week    Types: Marijuana, Cocaine    Comment: stopped marijuana May 07, 2012, stopped cocaine 1980     Family Hx: The patient's family history includes Aneurysm in his father; Diabetes in his mother; Heart attack in his mother and sister; Heart disease in his brother, father, mother, and sister; Hypertension in his mother.  ROS:   Please see the history of present illness.    All other systems reviewed and are negative.  Prior CV studies:   The following studies were reviewed today:   - LHC (11/2013):  Mid LAD 30%, OM1 occluded (L-L collats), prox RCA stent ok, EF 55-65%.    - Echo (07/2013):  EF 55-60%, no RWMA.  - Nuclear (11/2013):  Inf and lat infarct with mild peri-infarct ischemia, EF 41%; intermediate risk   Labs/Other Tests and Data Reviewed:    EKG:  No ECG reviewed.  Recent Labs:    06/19/2018: ALT 16; Platelets 119 06/24/2018: BUN 17; Creatinine, Ser 0.70; Hemoglobin 15.6; Potassium 4.2; Sodium 139   Recent Lipid Panel Lab Results  Component Value Date/Time   CHOL 154 11/24/2015 04:45 AM   TRIG 115 12/25/2015 04:50 AM   HDL 36 (L) 11/24/2015 04:45 AM   CHOLHDL 4.3 11/24/2015 04:45 AM   LDLCALC 96 11/24/2015 04:45 AM      Wt Readings from Last 3 Encounters:  03/26/19 144  lb (65.3 kg)  09/22/18 145 lb 12.8 oz (66.1 kg)  06/19/18 145 lb (65.8 kg)     Objective:    Vital Signs:  Ht 6\' 3"  (1.905 m)   Wt 144 lb (65.3 kg)   BMI 18.00 kg/m    VITAL SIGNS:  reviewed GEN:  no acute distress RESPIRATORY:  no labored breathing noted during our conversation NEURO:  alert and oriented. PSYCH:  he seems to be in good spirits.   ASSESSMENT & PLAN:    Coronary artery disease involving native coronary artery of native heart without angina pectoris Prior hx of MI treated with DES to the RCA. His OM is known to be chronically occluded.  He has chronic chest pain but has not had any change in his symptoms.  He has been resistant to taking medications for risk reduction or prevention.  He had a prolonged admission in 2017 with perforated bowel.  ASA may not be the best choice for myocardial infarction prevention, but clopidogrel would be a reasonable choice.  I have discussed this with him today but he would like to consider this for now.  He is intol of statins.    PAOD (peripheral arterial occlusive disease) ([place]) Continue follow up with vascular surgery as planned.  Essential (primary) hypertension He is unable to check his blood pressure.  I will arrange for him to get a cuff.  His blood pressure is typically too high and he is not interested in taking medications.  At his last visit with his PCP, Amlodipine was recommended for his blood pressure.  The patient is not taking this and he tells me that his primary care provider was "quite pleased" with his blood pressure at his last visit.  We did discuss the importance of good blood pressure control to prevent heart attacks and strokes.   Hyperlipidemia, unspecified hyperlipidemia type He is intol of statins.  I did suggest we consider a referral to the [facility] in the future for consideration of a PCSK-9 inhibitor.  He notes that he takes a lot of herbal supplements to help with his cholesterol.    COVID-19  Education: The signs and symptoms of COVID-19 were discussed with the patient and how to seek care for testing (follow up with PCP or arrange E-visit).  The importance of social distancing was discussed today.  Time:   Today, I have spent 19 minutes with the patient with telehealth technology discussing the above problems.     Medication Adjustments/Labs and Tests Ordered: Current medicines are reviewed at length with the patient today.  Concerns regarding medicines are outlined above.   Tests Ordered: No orders of the defined types were placed in this encounter.   Medication Changes: No orders of the defined types were placed in this encounter.   Disposition:  Follow up in 1 year(s)  Signed, Richardson Dopp, PA-C  03/26/2019 2:00 PM    [place]

## 2019-03-25 NOTE — Telephone Encounter (Signed)
NEW MESSAGE D   I called pt to reschedule pt for his appt with Dr Burt Knack moved him to appt with Richardson Dopp for 5/1 , patient is have complaint of muscle tightness in his chest, he said it was not chest pain , but a tightness , pinching feeling, asked if I was a nurse and I told him no but I would send message to Dr York Cerise nurse   Pt c/o of Chest Pain: STAT if CP now or developed within 24 hours  1. Are you having CP right now? No   2. Are you experiencing any other symptoms (ex. SOB, nausea, vomiting, sweating)? no  3. How long have you been experiencing CP? Chest tightness comes and goes , been happening awhile , doesn't know if it has seomething to do with valve replacement?  4. Is your CP continuous or coming and going? Comes and goes   5. Have you taken Nitroglycerin? no ?

## 2019-03-25 NOTE — Telephone Encounter (Signed)
Patient set up for MyChart? No decline   Is patient using Smartphone/computer/tablet yes   Did audio/video work?  Does patient need telephone visit? No   Best phone number to use? [phone]  Special Instructions? Patient has scale for wt , but does not have bp      Virtual Visit Pre-Appointment Phone Call  "(Name), I am calling you today to discuss your upcoming appointment. We are currently trying to limit exposure to the virus that causes COVID-19 by seeing patients at home rather than in the office."  1. "What is the BEST phone number to call the day of the visit?" - include this in appointment notes  2. Do you have or have access to (through a family member/friend) a smartphone with video capability that we can use for your visit?" a. If yes - list this number in appt notes as cell (if different from BEST phone #) and list the appointment type as a VIDEO visit in appointment notes b. If no - list the appointment type as a PHONE visit in appointment notes  3. Confirm consent - "In the setting of the current Covid19 crisis, you are scheduled for a (phone or video) visit with your provider on (date) at (time).  Just as we do with many in-office visits, in order for you to participate in this visit, we must obtain consent.  If you'd like, I can send this to your mychart (if signed up) or email for you to review.  Otherwise, I can obtain your verbal consent now.  All virtual visits are billed to your insurance company just like a normal visit would be.  By agreeing to a virtual visit, we'd like you to understand that the technology does not allow for your provider to perform an examination, and thus may limit your provider's ability to fully assess your condition. If your provider identifies any concerns that need to be evaluated in person, we will make arrangements to do so.  Finally, though the technology is pretty good, we cannot assure that it will always work on either your or our  end, and in the setting of a video visit, we may have to convert it to a phone-only visit.  In either situation, we cannot ensure that we have a secure connection.  Are you willing to proceed?" STAFF: Did the patient verbally acknowledge consent to telehealth visit? Document YES/NO here: yes   4. Advise patient to be prepared - "Two hours prior to your appointment, go ahead and check your blood pressure, pulse, oxygen saturation, and your weight (if you have the equipment to check those) and write them all down. When your visit starts, your provider will ask you for this information. If you have an Apple Watch or Kardia device, please plan to have heart rate information ready on the day of your appointment. Please have a pen and paper handy nearby the day of the visit as well."  5. Give patient instructions for MyChart download to smartphone OR Doximity/Doxy.me as below if video visit (depending on what platform provider is using)  6. Inform patient they will receive a phone call 15 minutes prior to their appointment time (may be from unknown caller ID) so they should be prepared to answer    [name] [name] has been deemed a candidate for a follow-up tele-health visit to limit community exposure during the Covid-19 pandemic. I spoke with the patient via phone to ensure availability of phone/video source, confirm preferred  email & phone number, and discuss instructions and expectations.  I reminded [name] [name] to be prepared with any vital sign and/or heart rhythm information that could potentially be obtained via home monitoring, at the time of his visit. I reminded [name] [name] to expect a phone call prior to his visit.  [name] [name] 03/25/2019 4:12 PM   INSTRUCTIONS FOR DOWNLOADING THE MYCHART APP TO SMARTPHONE  - The patient must first make sure to have activated MyChart and know their login information - If Apple, go to [employer] and type in MyChart in the  search bar and download the app. If Android, ask patient to go to [employer] and type in [place] in the search bar and download the app. The app is free but as with any other app downloads, their phone may require them to verify saved payment information or Apple/Android password.  - The patient will need to then log into the app with their MyChart username and password, and select [name] [name] as their healthcare provider to link the account. When it is time for your visit, go to the MyChart app, find appointments, and click Begin Video Visit. Be sure to Select Allow for your device to access the Microphone and Camera for your visit. You will then be connected, and your provider will be with you shortly.  **If they have any issues connecting, or need assistance please contact MyChart service desk [phone])**  **If using a computer, in order to ensure the best quality for their visit they will need to use either of the following Internet Browsers: [employer], or Google Chrome**  IF USING DOXIMITY or DOXY.ME - The patient will receive a link just prior to their visit by text.     FULL LENGTH CONSENT FOR TELE-HEALTH VISIT   I hereby voluntarily request, consent and authorize [facility] and its employed or contracted physicians, physician assistants, nurse practitioners or other licensed health care professionals (the Practitioner), to provide me with telemedicine health care services (the Services") as deemed necessary by the treating Practitioner. I acknowledge and consent to receive the Services by the Practitioner via telemedicine. I understand that the telemedicine visit will involve communicating with the Practitioner through live audiovisual communication technology and the disclosure of certain medical information by electronic transmission. I acknowledge that I have been given the opportunity to request an in-person assessment or other available alternative prior  to the telemedicine visit and am voluntarily participating in the telemedicine visit.  I understand that I have the right to withhold or withdraw my consent to the use of telemedicine in the course of my care at any time, without affecting my right to future care or treatment, and that the Practitioner or I may terminate the telemedicine visit at any time. I understand that I have the right to inspect all information obtained and/or recorded in the course of the telemedicine visit and may receive copies of available information for a reasonable fee.  I understand that some of the potential risks of receiving the Services via telemedicine include:   Delay or interruption in medical evaluation due to technological equipment failure or disruption;  Information transmitted may not be sufficient (e.g. poor resolution of images) to allow for appropriate medical decision making by the Practitioner; and/or   In rare instances, security protocols could fail, causing a breach of personal health information.  Furthermore, I acknowledge that it is my responsibility to provide information about my medical history, conditions and  care that is complete and accurate to the best of my ability. I acknowledge that Practitioner's advice, recommendations, and/or decision may be based on factors not within their control, such as incomplete or inaccurate data provided by me or distortions of diagnostic images or specimens that may result from electronic transmissions. I understand that the practice of medicine is not an exact science and that Practitioner makes no warranties or guarantees regarding treatment outcomes. I acknowledge that I will receive a copy of this consent concurrently upon execution via email to the email address I last provided but may also request a printed copy by calling the office of [facility].    I understand that my insurance will be billed for this visit.   I have read or had this consent read  to me.  I understand the contents of this consent, which adequately explains the benefits and risks of the Services being provided via telemedicine.   I have been provided ample opportunity to ask questions regarding this consent and the Services and have had my questions answered to my satisfaction.  I give my informed consent for the services to be provided through the use of telemedicine in my medical care  By participating in this telemedicine visit I agree to the above.

## 2019-03-25 NOTE — Telephone Encounter (Signed)
Called to check on patient. He states he has no symptoms right now. When he dos have chest pain, it almost feels like he slept wrong and a muscle is pulled. He will keep his overdue appointment as scheduled for tomorrow.  He was grateful for call and agrees with plan.

## 2019-03-25 NOTE — Telephone Encounter (Signed)
Left message on vm to move appointment from Dr Burt Knack schedule for Monday 5/4 , ok to put patient with APP for virtual appt, get consent from patient

## 2019-03-26 ENCOUNTER — Telehealth: Payer: Self-pay | Admitting: Licensed Clinical Social Worker

## 2019-03-26 ENCOUNTER — Encounter: Payer: Self-pay | Admitting: Physician Assistant

## 2019-03-26 ENCOUNTER — Telehealth (INDEPENDENT_AMBULATORY_CARE_PROVIDER_SITE_OTHER): Payer: Medicare Other | Admitting: Physician Assistant

## 2019-03-26 ENCOUNTER — Other Ambulatory Visit: Payer: Self-pay

## 2019-03-26 VITALS — Ht 75.0 in | Wt 144.0 lb

## 2019-03-26 DIAGNOSIS — Z7189 Other specified counseling: Secondary | ICD-10-CM

## 2019-03-26 DIAGNOSIS — I2510 Atherosclerotic heart disease of native coronary artery without angina pectoris: Secondary | ICD-10-CM

## 2019-03-26 DIAGNOSIS — E785 Hyperlipidemia, unspecified: Secondary | ICD-10-CM

## 2019-03-26 DIAGNOSIS — I10 Essential (primary) hypertension: Secondary | ICD-10-CM

## 2019-03-26 DIAGNOSIS — I779 Disorder of arteries and arterioles, unspecified: Secondary | ICD-10-CM

## 2019-03-26 NOTE — Telephone Encounter (Signed)
CSW referred to assist patient with obtaining a BP cuff. CSW contacted patient to inform cuff will be delivered to her home next week. Patient grateful for support and assistance. CSW available as needed. Raquel Sarna, [place], [place]

## 2019-03-26 NOTE — Patient Instructions (Signed)
Medication Instructions:  No changes  If you need a refill on your cardiac medications before your next appointment, please call your pharmacy.   Lab work: None   If you have labs (blood work) drawn today and your tests are completely normal, you will receive your results only by: Marland Kitchen MyChart Message (if you have MyChart) OR . A paper copy in the mail If you have any lab test that is abnormal or we need to change your treatment, we will call you to review the results.  Testing/Procedures: None   Follow-Up: At [facility], you and your health needs are our priority.  As part of our continuing mission to provide you with exceptional heart care, we have created designated Provider Care Teams.  These Care Teams include your primary Cardiologist (physician) and Advanced Practice Providers (APPs -  Physician Assistants and Nurse Practitioners) who all work together to provide you with the care you need, when you need it. You will need a follow up appointment in:  12 months.  Please call our office 2 months in advance to schedule this appointment.  You may see Sherren Mocha, MD or Richardson Dopp, PA-C   Any Other Special Instructions Will Be Listed Below (If Applicable).  I will have a blood pressure cuff sent to your house.  If your blood pressure is greater than 130/80, call us so we can start you on blood pressure medication.  If you change your mind about taking Plavix (blood thinner) to prevent heart attacks or a cholesterol medication (to lower your risk of another heart attack), let us know.

## 2019-03-29 ENCOUNTER — Ambulatory Visit: Payer: Medicare Other | Admitting: Cardiovascular Disease

## 2019-12-21 ENCOUNTER — Encounter (HOSPITAL_COMMUNITY): Payer: Medicare Other

## 2019-12-21 ENCOUNTER — Other Ambulatory Visit: Payer: Self-pay

## 2019-12-21 ENCOUNTER — Ambulatory Visit: Payer: Medicare Other | Admitting: Vascular Surgery

## 2019-12-21 DIAGNOSIS — I779 Disorder of arteries and arterioles, unspecified: Secondary | ICD-10-CM

## 2019-12-21 DIAGNOSIS — I724 Aneurysm of artery of lower extremity: Secondary | ICD-10-CM

## 2020-03-15 ENCOUNTER — Emergency Department (HOSPITAL_COMMUNITY): Payer: Medicare Other | Admitting: Critical Care Medicine

## 2020-03-15 ENCOUNTER — Inpatient Hospital Stay (HOSPITAL_COMMUNITY)
Admission: EM | Admit: 2020-03-15 | Discharge: 2020-03-18 | DRG: 254 | Disposition: A | Discharge status: Home / Self Care | Payer: Medicare Other | Attending: Vascular Surgery | Admitting: Vascular Surgery

## 2020-03-15 ENCOUNTER — Encounter (HOSPITAL_COMMUNITY)
Admission: EM | Disposition: A | Discharge status: Home / Self Care | Payer: Self-pay | Source: Home / Self Care | Attending: Vascular Surgery

## 2020-03-15 ENCOUNTER — Encounter (HOSPITAL_COMMUNITY): Payer: Self-pay | Admitting: Emergency Medicine

## 2020-03-15 ENCOUNTER — Emergency Department (HOSPITAL_COMMUNITY): Payer: Medicare Other

## 2020-03-15 ENCOUNTER — Other Ambulatory Visit: Payer: Self-pay

## 2020-03-15 DIAGNOSIS — K219 Gastro-esophageal reflux disease without esophagitis: Secondary | ICD-10-CM | POA: Diagnosis present

## 2020-03-15 DIAGNOSIS — I70222 Atherosclerosis of native arteries of extremities with rest pain, left leg: Secondary | ICD-10-CM | POA: Diagnosis not present

## 2020-03-15 DIAGNOSIS — I2510 Atherosclerotic heart disease of native coronary artery without angina pectoris: Secondary | ICD-10-CM | POA: Diagnosis present

## 2020-03-15 DIAGNOSIS — I10 Essential (primary) hypertension: Secondary | ICD-10-CM | POA: Diagnosis present

## 2020-03-15 DIAGNOSIS — Z833 Family history of diabetes mellitus: Secondary | ICD-10-CM

## 2020-03-15 DIAGNOSIS — T82868A Thrombosis of vascular prosthetic devices, implants and grafts, initial encounter: Principal | ICD-10-CM | POA: Diagnosis present

## 2020-03-15 DIAGNOSIS — E1151 Type 2 diabetes mellitus with diabetic peripheral angiopathy without gangrene: Secondary | ICD-10-CM | POA: Diagnosis present

## 2020-03-15 DIAGNOSIS — F17210 Nicotine dependence, cigarettes, uncomplicated: Secondary | ICD-10-CM | POA: Diagnosis present

## 2020-03-15 DIAGNOSIS — Z8249 Family history of ischemic heart disease and other diseases of the circulatory system: Secondary | ICD-10-CM

## 2020-03-15 DIAGNOSIS — Z20822 Contact with and (suspected) exposure to covid-19: Secondary | ICD-10-CM | POA: Diagnosis present

## 2020-03-15 DIAGNOSIS — I998 Other disorder of circulatory system: Secondary | ICD-10-CM

## 2020-03-15 DIAGNOSIS — I724 Aneurysm of artery of lower extremity: Secondary | ICD-10-CM | POA: Diagnosis present

## 2020-03-15 DIAGNOSIS — Y832 Surgical operation with anastomosis, bypass or graft as the cause of abnormal reaction of the patient, or of later complication, without mention of misadventure at the time of the procedure: Secondary | ICD-10-CM | POA: Diagnosis present

## 2020-03-15 DIAGNOSIS — Z955 Presence of coronary angioplasty implant and graft: Secondary | ICD-10-CM | POA: Diagnosis not present

## 2020-03-15 DIAGNOSIS — I70229 Atherosclerosis of native arteries of extremities with rest pain, unspecified extremity: Secondary | ICD-10-CM

## 2020-03-15 DIAGNOSIS — Z888 Allergy status to other drugs, medicaments and biological substances status: Secondary | ICD-10-CM

## 2020-03-15 HISTORY — PX: FALSE ANEURYSM REPAIR: SHX5152

## 2020-03-15 HISTORY — PX: AORTA - BILATERAL FEMORAL ARTERY BYPASS GRAFT: SHX1175

## 2020-03-15 HISTORY — PX: THROMBECTOMY: PRO61

## 2020-03-15 LAB — TYPE AND SCREEN
ABO/RH(D): A NEG
Antibody Screen: NEGATIVE

## 2020-03-15 LAB — CBC WITH DIFFERENTIAL/PLATELET
Abs Immature Granulocytes: 0.01 10*3/uL (ref 0.00–0.07)
Basophils Absolute: 0.1 10*3/uL (ref 0.0–0.1)
Basophils Relative: 1 %
Eosinophils Absolute: 0.1 10*3/uL (ref 0.0–0.5)
Eosinophils Relative: 2 %
HCT: 46.4 % (ref 39.0–52.0)
Hemoglobin: 15.1 g/dL (ref 13.0–17.0)
Immature Granulocytes: 0 %
Lymphocytes Relative: 45 %
Lymphs Abs: 1.8 10*3/uL (ref 0.7–4.0)
MCH: 30.1 pg (ref 26.0–34.0)
MCHC: 32.5 g/dL (ref 30.0–36.0)
MCV: 92.4 fL (ref 80.0–100.0)
Monocytes Absolute: 0.6 10*3/uL (ref 0.1–1.0)
Monocytes Relative: 14 %
Neutro Abs: 1.5 10*3/uL — ABNORMAL LOW (ref 1.7–7.7)
Neutrophils Relative %: 38 %
Platelets: UNDETERMINED 10*3/uL (ref 150–400)
RBC: 5.02 MIL/uL (ref 4.22–5.81)
RDW: 13.9 % (ref 11.5–15.5)
WBC: 4.1 10*3/uL (ref 4.0–10.5)
nRBC: 0 % (ref 0.0–0.2)

## 2020-03-15 LAB — POCT I-STAT, CHEM 8
BUN: 11 mg/dL (ref 8–23)
Calcium, Ion: 1.33 mmol/L (ref 1.15–1.40)
Chloride: 104 mmol/L (ref 98–111)
Creatinine, Ser: 0.5 mg/dL — ABNORMAL LOW (ref 0.61–1.24)
Glucose, Bld: 134 mg/dL — ABNORMAL HIGH (ref 70–99)
HCT: 41 % (ref 39.0–52.0)
Hemoglobin: 13.9 g/dL (ref 13.0–17.0)
Potassium: 4.2 mmol/L (ref 3.5–5.1)
Sodium: 138 mmol/L (ref 135–145)
TCO2: 27 mmol/L (ref 22–32)

## 2020-03-15 LAB — COMPREHENSIVE METABOLIC PANEL
ALT: 19 U/L (ref 0–44)
AST: 22 U/L (ref 15–41)
Albumin: 3.9 g/dL (ref 3.5–5.0)
Alkaline Phosphatase: 47 U/L (ref 38–126)
Anion gap: 11 (ref 5–15)
BUN: 14 mg/dL (ref 8–23)
CO2: 23 mmol/L (ref 22–32)
Calcium: 9.9 mg/dL (ref 8.9–10.3)
Chloride: 100 mmol/L (ref 98–111)
Creatinine, Ser: 0.72 mg/dL (ref 0.61–1.24)
GFR calc Af Amer: >60 mL/min (ref >60)
GFR calc non Af Amer: >60 mL/min (ref >60)
Glucose, Bld: 159 mg/dL — ABNORMAL HIGH (ref 70–99)
Potassium: 4.1 mmol/L (ref 3.5–5.1)
Sodium: 134 mmol/L — ABNORMAL LOW (ref 135–145)
Total Bilirubin: 0.6 mg/dL (ref 0.3–1.2)
Total Protein: 6.8 g/dL (ref 6.5–8.1)

## 2020-03-15 LAB — RESPIRATORY PANEL BY RT PCR (FLU A&B, COVID)
Influenza A by PCR: NEGATIVE
Influenza B by PCR: NEGATIVE
SARS Coronavirus 2 by RT PCR: NEGATIVE

## 2020-03-15 LAB — GLUCOSE, CAPILLARY
Glucose-Capillary: 136 mg/dL — ABNORMAL HIGH (ref 70–99)
Glucose-Capillary: 236 mg/dL — ABNORMAL HIGH (ref 70–99)

## 2020-03-15 LAB — LACTIC ACID, PLASMA: Lactic Acid, Venous: 1 mmol/L (ref 0.5–1.9)

## 2020-03-15 LAB — SURGICAL PCR SCREEN
MRSA, PCR: NEGATIVE
Staphylococcus aureus: NEGATIVE

## 2020-03-15 LAB — PLATELET COUNT: Platelets: 117 10*3/uL — ABNORMAL LOW (ref 150–400)

## 2020-03-15 SURGERY — CREATION, BYPASS, ARTERIAL, AORTA TO FEMORAL, BILATERAL, USING GRAFT
Anesthesia: General | Site: Groin | Laterality: Left

## 2020-03-15 MED ORDER — PHENYLEPHRINE 40 MCG/ML (10ML) SYRINGE FOR IV PUSH (FOR BLOOD PRESSURE SUPPORT)
PREFILLED_SYRINGE | INTRAVENOUS | Status: AC
  Filled 2020-03-15: qty 20

## 2020-03-15 MED ORDER — PANTOPRAZOLE SODIUM 40 MG PO TBEC
40.0000 mg | DELAYED_RELEASE_TABLET | Freq: Every day | ORAL | Status: DC
  Administered 2020-03-17 – 2020-03-18 (×2): 40 mg via ORAL
  Filled 2020-03-15 (×2): qty 1

## 2020-03-15 MED ORDER — SODIUM CHLORIDE 0.9 % IV SOLN: INTRAVENOUS | Status: DC

## 2020-03-15 MED ORDER — FENTANYL CITRATE (PF) 100 MCG/2ML IJ SOLN
INTRAMUSCULAR | Status: AC
  Filled 2020-03-15: qty 2

## 2020-03-15 MED ORDER — POTASSIUM CHLORIDE CRYS ER 20 MEQ PO TBCR: 20.0000 meq | EXTENDED_RELEASE_TABLET | Freq: Every day | ORAL | Status: DC | PRN

## 2020-03-15 MED ORDER — HEPARIN SODIUM (PORCINE) 1000 UNIT/ML IJ SOLN
INTRAMUSCULAR | Status: DC | PRN
  Administered 2020-03-15: 7000 [IU] via INTRAVENOUS

## 2020-03-15 MED ORDER — LABETALOL HCL 5 MG/ML IV SOLN: 10.0000 mg | INTRAVENOUS | Status: DC | PRN

## 2020-03-15 MED ORDER — DEXAMETHASONE SODIUM PHOSPHATE 10 MG/ML IJ SOLN
INTRAMUSCULAR | Status: AC
  Filled 2020-03-15: qty 1

## 2020-03-15 MED ORDER — FENTANYL CITRATE (PF) 250 MCG/5ML IJ SOLN
INTRAMUSCULAR | Status: AC
  Filled 2020-03-15: qty 5

## 2020-03-15 MED ORDER — CHLORHEXIDINE GLUCONATE CLOTH 2 % EX PADS: 6.0000 | MEDICATED_PAD | Freq: Every day | CUTANEOUS | Status: DC

## 2020-03-15 MED ORDER — MUPIROCIN 2 % EX OINT: 1.0000 "application " | TOPICAL_OINTMENT | Freq: Once | CUTANEOUS | Status: AC

## 2020-03-15 MED ORDER — PHENYLEPHRINE 40 MCG/ML (10ML) SYRINGE FOR IV PUSH (FOR BLOOD PRESSURE SUPPORT)
PREFILLED_SYRINGE | INTRAVENOUS | Status: DC | PRN
  Administered 2020-03-15 (×2): 80 ug via INTRAVENOUS
  Administered 2020-03-15: 120 ug via INTRAVENOUS

## 2020-03-15 MED ORDER — HYDROMORPHONE HCL 1 MG/ML IJ SOLN
0.2500 mg | INTRAMUSCULAR | Status: DC | PRN
  Administered 2020-03-15 (×2): 0.5 mg via INTRAVENOUS

## 2020-03-15 MED ORDER — MORPHINE SULFATE (PF) 2 MG/ML IV SOLN
2.0000 mg | INTRAVENOUS | Status: DC | PRN
  Administered 2020-03-15 – 2020-03-18 (×12): 2 mg via INTRAVENOUS
  Filled 2020-03-15 (×12): qty 1

## 2020-03-15 MED ORDER — CEFAZOLIN SODIUM-DEXTROSE 2-4 GM/100ML-% IV SOLN
2.0000 g | Freq: Three times a day (TID) | INTRAVENOUS | Status: AC
  Administered 2020-03-15 – 2020-03-16 (×2): 2 g via INTRAVENOUS
  Filled 2020-03-15 (×2): qty 100

## 2020-03-15 MED ORDER — HYDRALAZINE HCL 20 MG/ML IJ SOLN: 5.0000 mg | INTRAMUSCULAR | Status: DC | PRN

## 2020-03-15 MED ORDER — ROCURONIUM BROMIDE 10 MG/ML (PF) SYRINGE
PREFILLED_SYRINGE | INTRAVENOUS | Status: AC
  Filled 2020-03-15: qty 20

## 2020-03-15 MED ORDER — PHENYLEPHRINE HCL-NACL 10-0.9 MG/250ML-% IV SOLN
INTRAVENOUS | Status: DC | PRN
  Administered 2020-03-15: 50 ug/min via INTRAVENOUS

## 2020-03-15 MED ORDER — FLUMAZENIL 0.5 MG/5ML IV SOLN
INTRAVENOUS | Status: AC
  Filled 2020-03-15: qty 5

## 2020-03-15 MED ORDER — LACTATED RINGERS IV SOLN: INTRAVENOUS | Status: DC

## 2020-03-15 MED ORDER — PHENOL 1.4 % MT LIQD: 1.0000 | OROMUCOSAL | Status: DC | PRN

## 2020-03-15 MED ORDER — ONDANSETRON HCL 4 MG/2ML IJ SOLN
4.0000 mg | Freq: Once | INTRAMUSCULAR | Status: AC
  Administered 2020-03-15: 4 mg via INTRAVENOUS
  Filled 2020-03-15: qty 2

## 2020-03-15 MED ORDER — METOPROLOL TARTRATE 5 MG/5ML IV SOLN: 2.0000 mg | INTRAVENOUS | Status: DC | PRN

## 2020-03-15 MED ORDER — GLYCOPYRROLATE PF 0.2 MG/ML IJ SOSY
PREFILLED_SYRINGE | INTRAMUSCULAR | Status: DC | PRN
  Administered 2020-03-15 (×2): .1 mg via INTRAVENOUS

## 2020-03-15 MED ORDER — MAGNESIUM SULFATE 2 GM/50ML IV SOLN: 2.0000 g | Freq: Every day | INTRAVENOUS | Status: DC | PRN

## 2020-03-15 MED ORDER — ASPIRIN EC 81 MG PO TBEC
81.0000 mg | DELAYED_RELEASE_TABLET | Freq: Every day | ORAL | Status: DC
  Administered 2020-03-17 – 2020-03-18 (×2): 81 mg via ORAL
  Filled 2020-03-15 (×2): qty 1

## 2020-03-15 MED ORDER — ONDANSETRON HCL 4 MG/2ML IJ SOLN
INTRAMUSCULAR | Status: AC
  Filled 2020-03-15: qty 4

## 2020-03-15 MED ORDER — ONDANSETRON HCL 4 MG/2ML IJ SOLN: 4.0000 mg | Freq: Four times a day (QID) | INTRAMUSCULAR | Status: DC | PRN

## 2020-03-15 MED ORDER — ACETAMINOPHEN 325 MG RE SUPP: 325.0000 mg | RECTAL | Status: DC | PRN

## 2020-03-15 MED ORDER — LACTATED RINGERS IV SOLN: INTRAVENOUS | Status: DC | PRN

## 2020-03-15 MED ORDER — EPHEDRINE 5 MG/ML INJ
INTRAVENOUS | Status: AC
  Filled 2020-03-15: qty 10

## 2020-03-15 MED ORDER — MUPIROCIN 2 % EX OINT
TOPICAL_OINTMENT | CUTANEOUS | Status: AC
  Administered 2020-03-15: 1 via TOPICAL
  Filled 2020-03-15: qty 22

## 2020-03-15 MED ORDER — PROPOFOL 10 MG/ML IV BOLUS
INTRAVENOUS | Status: AC
  Filled 2020-03-15: qty 20

## 2020-03-15 MED ORDER — MORPHINE SULFATE (PF) 4 MG/ML IV SOLN
4.0000 mg | Freq: Once | INTRAVENOUS | Status: AC
  Administered 2020-03-15: 4 mg via INTRAVENOUS
  Filled 2020-03-15: qty 1

## 2020-03-15 MED ORDER — OXYCODONE-ACETAMINOPHEN 5-325 MG PO TABS
1.0000 | ORAL_TABLET | ORAL | Status: DC | PRN
  Administered 2020-03-15 – 2020-03-18 (×13): 2 via ORAL
  Filled 2020-03-15 (×12): qty 2

## 2020-03-15 MED ORDER — LIDOCAINE 2% (20 MG/ML) 5 ML SYRINGE
INTRAMUSCULAR | Status: AC
  Filled 2020-03-15: qty 10

## 2020-03-15 MED ORDER — SODIUM CHLORIDE 0.9 % IV SOLN: 500.0000 mL | Freq: Once | INTRAVENOUS | Status: DC | PRN

## 2020-03-15 MED ORDER — HYDROMORPHONE HCL 1 MG/ML IJ SOLN
INTRAMUSCULAR | Status: AC
  Filled 2020-03-15: qty 1

## 2020-03-15 MED ORDER — ONDANSETRON HCL 4 MG/2ML IJ SOLN
4.0000 mg | Freq: Four times a day (QID) | INTRAMUSCULAR | Status: DC | PRN
  Administered 2020-03-17: 4 mg via INTRAVENOUS
  Filled 2020-03-15: qty 2

## 2020-03-15 MED ORDER — FENTANYL CITRATE (PF) 100 MCG/2ML IJ SOLN
25.0000 ug | INTRAMUSCULAR | Status: DC | PRN
  Administered 2020-03-15: 50 ug via INTRAVENOUS

## 2020-03-15 MED ORDER — MIDAZOLAM HCL 5 MG/5ML IJ SOLN
INTRAMUSCULAR | Status: DC | PRN
  Administered 2020-03-15: 2 mg via INTRAVENOUS

## 2020-03-15 MED ORDER — CEFAZOLIN SODIUM 1 G IJ SOLR
INTRAMUSCULAR | Status: AC
  Filled 2020-03-15: qty 20

## 2020-03-15 MED ORDER — DEXAMETHASONE SODIUM PHOSPHATE 10 MG/ML IJ SOLN
INTRAMUSCULAR | Status: DC | PRN
  Administered 2020-03-15: 4 mg via INTRAVENOUS

## 2020-03-15 MED ORDER — ARTIFICIAL TEARS OPHTHALMIC OINT
TOPICAL_OINTMENT | OPHTHALMIC | Status: AC
  Filled 2020-03-15: qty 3.5

## 2020-03-15 MED ORDER — MIDAZOLAM HCL 2 MG/2ML IJ SOLN
INTRAMUSCULAR | Status: AC
  Filled 2020-03-15: qty 2

## 2020-03-15 MED ORDER — DORZOLAMIDE HCL-TIMOLOL MAL 2-0.5 % OP SOLN
1.0000 [drp] | Freq: Two times a day (BID) | OPHTHALMIC | Status: DC
  Administered 2020-03-15 – 2020-03-18 (×3): 1 [drp] via OPHTHALMIC
  Filled 2020-03-15: qty 10

## 2020-03-15 MED ORDER — ACETAMINOPHEN 325 MG PO TABS: 325.0000 mg | ORAL_TABLET | ORAL | Status: DC | PRN

## 2020-03-15 MED ORDER — SODIUM CHLORIDE 0.9 % IV SOLN
INTRAVENOUS | Status: DC | PRN
  Administered 2020-03-15: 500 mL

## 2020-03-15 MED ORDER — HYDROMORPHONE HCL 1 MG/ML IJ SOLN
1.0000 mg | Freq: Once | INTRAMUSCULAR | Status: AC
  Administered 2020-03-15: 1 mg via INTRAVENOUS
  Filled 2020-03-15: qty 1

## 2020-03-15 MED ORDER — 0.9 % SODIUM CHLORIDE (POUR BTL) OPTIME
TOPICAL | Status: DC | PRN
  Administered 2020-03-15: 13:00:00 1000 mL

## 2020-03-15 MED ORDER — LIDOCAINE 2% (20 MG/ML) 5 ML SYRINGE
INTRAMUSCULAR | Status: DC | PRN
  Administered 2020-03-15: 50 mg via INTRAVENOUS

## 2020-03-15 MED ORDER — PROPOFOL 10 MG/ML IV BOLUS
INTRAVENOUS | Status: DC | PRN
  Administered 2020-03-15: 100 mg via INTRAVENOUS

## 2020-03-15 MED ORDER — GABAPENTIN 300 MG PO CAPS
300.0000 mg | ORAL_CAPSULE | Freq: Three times a day (TID) | ORAL | Status: DC | PRN
  Administered 2020-03-16 – 2020-03-17 (×2): 300 mg via ORAL
  Filled 2020-03-15 (×2): qty 1

## 2020-03-15 MED ORDER — ZOLPIDEM TARTRATE 5 MG PO TABS
5.0000 mg | ORAL_TABLET | Freq: Every evening | ORAL | Status: DC | PRN
  Administered 2020-03-16 – 2020-03-17 (×2): 5 mg via ORAL
  Filled 2020-03-15 (×2): qty 1

## 2020-03-15 MED ORDER — BISACODYL 10 MG RE SUPP: 10.0000 mg | Freq: Every day | RECTAL | Status: DC | PRN

## 2020-03-15 MED ORDER — OXYCODONE HCL 5 MG/5ML PO SOLN: 5.0000 mg | Freq: Once | ORAL | Status: DC | PRN

## 2020-03-15 MED ORDER — SODIUM CHLORIDE 0.9 % IV SOLN
INTRAVENOUS | Status: AC
  Filled 2020-03-15: qty 1.2

## 2020-03-15 MED ORDER — CEFAZOLIN SODIUM-DEXTROSE 2-3 GM-%(50ML) IV SOLR
INTRAVENOUS | Status: DC | PRN
  Administered 2020-03-15: 2 g via INTRAVENOUS

## 2020-03-15 MED ORDER — ROCURONIUM BROMIDE 10 MG/ML (PF) SYRINGE
PREFILLED_SYRINGE | INTRAVENOUS | Status: DC | PRN
  Administered 2020-03-15: 50 mg via INTRAVENOUS
  Administered 2020-03-15 (×2): 10 mg via INTRAVENOUS
  Administered 2020-03-15: 30 mg via INTRAVENOUS

## 2020-03-15 MED ORDER — DOCUSATE SODIUM 100 MG PO CAPS
100.0000 mg | ORAL_CAPSULE | Freq: Every day | ORAL | Status: DC
  Administered 2020-03-17 – 2020-03-18 (×2): 100 mg via ORAL
  Filled 2020-03-15 (×2): qty 1

## 2020-03-15 MED ORDER — EPHEDRINE SULFATE-NACL 50-0.9 MG/10ML-% IV SOSY
PREFILLED_SYRINGE | INTRAVENOUS | Status: DC | PRN
  Administered 2020-03-15: 5 mg via INTRAVENOUS

## 2020-03-15 MED ORDER — POLYETHYLENE GLYCOL 3350 17 G PO PACK: 17.0000 g | PACK | Freq: Every day | ORAL | Status: DC | PRN

## 2020-03-15 MED ORDER — ALUM & MAG HYDROXIDE-SIMETH 200-200-20 MG/5ML PO SUSP: 15.0000 mL | ORAL | Status: DC | PRN

## 2020-03-15 MED ORDER — OXYCODONE HCL 5 MG PO TABS: 5.0000 mg | ORAL_TABLET | Freq: Once | ORAL | Status: DC | PRN

## 2020-03-15 MED ORDER — METHOCARBAMOL 500 MG PO TABS: 500.0000 mg | ORAL_TABLET | Freq: Two times a day (BID) | ORAL | Status: DC | PRN

## 2020-03-15 MED ORDER — HEPARIN SODIUM (PORCINE) 5000 UNIT/ML IJ SOLN
5000.0000 [IU] | Freq: Three times a day (TID) | INTRAMUSCULAR | Status: DC
  Administered 2020-03-16 – 2020-03-17 (×2): 5000 [IU] via SUBCUTANEOUS
  Filled 2020-03-15 (×3): qty 1

## 2020-03-15 MED ORDER — FENTANYL CITRATE (PF) 250 MCG/5ML IJ SOLN
INTRAMUSCULAR | Status: DC | PRN
  Administered 2020-03-15 (×2): 50 ug via INTRAVENOUS
  Administered 2020-03-15: 100 ug via INTRAVENOUS
  Administered 2020-03-15: 50 ug via INTRAVENOUS

## 2020-03-15 MED ORDER — PROTAMINE SULFATE 10 MG/ML IV SOLN
INTRAVENOUS | Status: DC | PRN
  Administered 2020-03-15: 50 mg via INTRAVENOUS

## 2020-03-15 MED ORDER — SUCCINYLCHOLINE CHLORIDE 200 MG/10ML IV SOSY
PREFILLED_SYRINGE | INTRAVENOUS | Status: AC
  Filled 2020-03-15: qty 10

## 2020-03-15 MED ORDER — ONDANSETRON HCL 4 MG/2ML IJ SOLN
INTRAMUSCULAR | Status: DC | PRN
  Administered 2020-03-15: 4 mg via INTRAVENOUS

## 2020-03-15 MED ORDER — SUGAMMADEX SODIUM 200 MG/2ML IV SOLN
INTRAVENOUS | Status: DC | PRN
  Administered 2020-03-15: 140 mg via INTRAVENOUS

## 2020-03-15 MED ORDER — GUAIFENESIN-DM 100-10 MG/5ML PO SYRP: 15.0000 mL | ORAL_SOLUTION | ORAL | Status: DC | PRN

## 2020-03-15 MED ORDER — IOHEXOL 350 MG/ML SOLN
100.0000 mL | Freq: Once | INTRAVENOUS | Status: AC | PRN
  Administered 2020-03-15: 100 mL via INTRAVENOUS

## 2020-03-15 SURGICAL SUPPLY — 50 items
BANDAGE ESMARK 6X9 LF (GAUZE/BANDAGES/DRESSINGS) IMPLANT
BNDG ESMARK 6X9 LF (GAUZE/BANDAGES/DRESSINGS)
CANISTER SUCT 3000ML PPV (MISCELLANEOUS) ×5 IMPLANT
CANNULA VESSEL 3MM 2 BLNT TIP (CANNULA) ×15 IMPLANT
CATH EMB 4FR 80CM (CATHETERS) ×5 IMPLANT
CATH EMB 5FR 80CM (CATHETERS) ×5 IMPLANT
CLIP LIGATING EXTRA MED SLVR (CLIP) ×5 IMPLANT
CLIP LIGATING EXTRA SM BLUE (MISCELLANEOUS) ×5 IMPLANT
CUFF TOURN SGL QUICK 34 (TOURNIQUET CUFF)
CUFF TOURN SGL QUICK 42 (TOURNIQUET CUFF) IMPLANT
CUFF TRNQT CYL 34X4.125X (TOURNIQUET CUFF) IMPLANT
DERMABOND ADVANCED (GAUZE/BANDAGES/DRESSINGS) ×2
DERMABOND ADVANCED .7 DNX12 (GAUZE/BANDAGES/DRESSINGS) ×3 IMPLANT
DRAIN SNY 10X20 3/4 PERF (WOUND CARE) IMPLANT
DRAPE HALF SHEET 40X57 (DRAPES) IMPLANT
DRAPE PERI GROIN 82X75IN TIB (DRAPES) IMPLANT
DRAPE X-RAY CASS 24X20 (DRAPES) IMPLANT
ELECT REM PT RETURN 9FT ADLT (ELECTROSURGICAL) ×5
ELECTRODE REM PT RTRN 9FT ADLT (ELECTROSURGICAL) ×3 IMPLANT
EVACUATOR SILICONE 100CC (DRAIN) IMPLANT
FELT TEFLON 1X6 (MISCELLANEOUS) IMPLANT
GLOVE SS BIOGEL STRL SZ 7.5 (GLOVE) ×3 IMPLANT
GLOVE SUPERSENSE BIOGEL SZ 7.5 (GLOVE) ×2
GOWN STRL REUS W/ TWL LRG LVL3 (GOWN DISPOSABLE) ×9 IMPLANT
GOWN STRL REUS W/TWL LRG LVL3 (GOWN DISPOSABLE) ×15
GRAFT HEMASHIELD 8MM (Vascular Products) ×5 IMPLANT
GRAFT VASC STRG 30X8KNIT (Vascular Products) ×3 IMPLANT
INSERT FOGARTY SM (MISCELLANEOUS) IMPLANT
KIT BASIN OR (CUSTOM PROCEDURE TRAY) ×5 IMPLANT
KIT TURNOVER KIT B (KITS) ×5 IMPLANT
NS IRRIG 1000ML POUR BTL (IV SOLUTION) ×10 IMPLANT
PACK PERIPHERAL VASCULAR (CUSTOM PROCEDURE TRAY) ×5 IMPLANT
PAD ARMBOARD 7.5X6 YLW CONV (MISCELLANEOUS) ×10 IMPLANT
PADDING CAST COTTON 6X4 STRL (CAST SUPPLIES) IMPLANT
SET COLLECT BLD 21X3/4 12 (NEEDLE) IMPLANT
SPONGE LAP 18X18 RF (DISPOSABLE) IMPLANT
STOPCOCK 4 WAY LG BORE MALE ST (IV SETS) IMPLANT
SUT PROLENE 5 0 C 1 24 (SUTURE) ×20 IMPLANT
SUT PROLENE 5 0 C 1 36 (SUTURE) ×5 IMPLANT
SUT PROLENE 6 0 CC (SUTURE) ×5 IMPLANT
SUT SILK 2 0 SH (SUTURE) ×5 IMPLANT
SUT VIC AB 2-0 CT1 36 (SUTURE) ×5 IMPLANT
SUT VIC AB 3-0 SH 27 (SUTURE) ×10
SUT VIC AB 3-0 SH 27X BRD (SUTURE) ×6 IMPLANT
SYR 3ML LL SCALE MARK (SYRINGE) ×5 IMPLANT
TOWEL GREEN STERILE (TOWEL DISPOSABLE) ×5 IMPLANT
TRAY FOLEY MTR SLVR 16FR STAT (SET/KITS/TRAYS/PACK) ×5 IMPLANT
TUBING EXTENTION W/L.L. (IV SETS) IMPLANT
UNDERPAD 30X30 (UNDERPADS AND DIAPERS) ×5 IMPLANT
WATER STERILE IRR 1000ML POUR (IV SOLUTION) ×10 IMPLANT

## 2020-03-15 NOTE — Anesthesia Procedure Notes (Signed)
Procedure Name: Intubation Date/Time: 03/15/2020 12:10 PM Performed by: Wilburn Cornelia, CRNA Pre-anesthesia Checklist: Patient identified, Emergency Drugs available, Suction available, Patient being monitored and Timeout performed Patient Re-evaluated:Patient Re-evaluated prior to induction Oxygen Delivery Method: Circle system utilized Preoxygenation: Pre-oxygenation with 100% oxygen Induction Type: IV induction Ventilation: Mask ventilation without difficulty Laryngoscope Size: Mac and 4 Grade View: Grade IV Tube type: Oral Tube size: 7.5 mm Number of attempts: 1 Airway Equipment and Method: Stylet Placement Confirmation: ETT inserted through vocal cords under direct vision,  positive ETCO2,  CO2 detector and breath sounds checked- equal and bilateral Secured at: 23 cm Tube secured with: Tape Dental Injury: Teeth and Oropharynx as per pre-operative assessment

## 2020-03-15 NOTE — ED Notes (Signed)
Pt transported to CT ?

## 2020-03-15 NOTE — Progress Notes (Signed)
    Patient comfortable intact motor, no pedal doppler signals Groin stable with palpable femoral pulse  S/P Right groin exploration with excision of false aneurysm.  Thrombectomy of left limb of the aortofemoral graft and profundus femoris artery.  Interposition graft of 8 mm Dacron from the old limb of the graft to into into the deep femoral artery.  Dr. Donnetta Hutching aware  Roxy Horseman PA-C

## 2020-03-15 NOTE — Op Note (Signed)
    OPERATIVE REPORT  DATE OF SURGERY: 03/15/2020  PATIENT: [name] [name], 69 y.o. 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  Thrombectomy of left limb of the aortofemoral graft and profundus femoris artery.  Interposition graft of 8 mm Dacron from the old limb of the graft to into into the deep femoral artery.  SURGEON:  Curt Jews, M.D.  PHYSICIAN ASSISTANT: Liana Crocker, PA-C  ANESTHESIA: General  EBL: per anesthesia record  Total I/O In: 1100 [I.V.:1100] Out: 475 [Urine:275; Blood:200]  BLOOD ADMINISTERED: none  DRAINS: none  SPECIMEN: none  COUNTS CORRECT:  YES  PATIENT DISPOSITION:  PACU - hemodynamically stable  PROCEDURE DETAILS: The patient was taken the operating placed supine position where the area of both groins and left leg were prepped draped usual sterile fashion.  Incision was made through the old scar and continued down to the femoral artery.  Patient had a moderate sized chronic false aneurysm.  The prior Dacron limb of the old aortofemoral graft on the left was encircled with vessel loop.  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  Patient had a vein graft femoropopliteal bypass several years ago and this was known to be chronically occluded and it was excised.  The patient's native superficial femoral artery had been excised in the past.  The patient had a large caliber deep femoral artery.  The patient was given 7000 intravenous heparin after adequate circulation time the false aneurysm was open.  There was thrombus present as expected.  The deep femoral artery was thrombectomized and there was good arterial backbleeding.  This was reoccluded.  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  The old left limb of  the aortofemoral graft was separated from the old anastomosis.  The external iliac was oversewn with a 5-0 Prolene suture since it was chronically occluded.  The left limb of the graft was thrombectomized with a 5 Fogarty catheter.  Excellent arterial inflow was encountered after removal of the thrombus.  When no further clot was removed the graft was reoccluded with a vascular clamp.  An 8 mm Dacron graft was brought onto the field and was sewn into into the old left limb of the aortofemoral bypass with a running 5-0 Prolene suture.  This anastomosis was tested and found to be adequate.  Next the graft was cut to the appropriate length and was sewn into into the deep femoral artery.  Prior to completion of the closure the usual flushing maneuvers were undertaken.  The anastomosis completed and there was good Doppler flow noted through the deep femoral artery.  There was no audible flow at the foot.  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  This felt that this brought him back to the flow that he had prior to his acute occlusion of his left limb 2 days ago.  The patient was given 50 mg of protamine to reverse heparin.  The groin wounds were closed with 2-0 Vicryl in several layers in the subcutaneous tissue and the skin was closed with 3-0 subcuticular Vicryl stitch   Rosetta Posner, M.D., [facility] 03/15/2020 3:50 PM

## 2020-03-15 NOTE — ED Provider Notes (Signed)
[facility] EMERGENCY DEPARTMENT Provider Note   CSN: [id number] Arrival date & time: 03/15/20  0344     History Chief Complaint  Patient presents with  . Leg Pain    [name] [name] is a very pleasant 69 y.o. male who presents emergency department with chief complaint of peripheral vascular disease, diabetes, hypertension who presents the emergency department with chief complaint of 10 out of 10 left leg pain.  Patient has history of multiple revascularization surgeries.  He had bilateral femoral bypass grafts.  Patient states that 2 days ago after finishing a job pressure washing he noticed that his left leg was severely painful and felt as it did previously when he had a blockage.  He noticed that he could not feel a pulse in the left femoral  bypass graft.  He states that he was hoping it would go away however it has progressively worsened.  He has altered sensation in his toes on the left side.  He has pain on the right side that is chronic. He denies fevers, chills,, cough, abdominal pain.  HPI       Past Medical History:  Diagnosis Date  . Arthritis   . Diabetes mellitus without complication ([place])   . GERD (gastroesophageal reflux disease)   . Hypertension   . PAD (peripheral artery disease) [facility])     Patient Active Problem List   Diagnosis Date Noted  . CAD (coronary artery disease) 12/02/2018  . Essential (primary) hypertension 12/02/2018  . Opiate dependence ([place]) 06/22/2018  . PAD (peripheral artery disease) ([place]) 11/13/2017  . Mood disorder ([place]) 01/28/2016  . Depression 01/28/2016  . Acute blood loss anemia 01/06/2016  . Polyneuropathy 01/06/2016  . Hypomagnesemia 12/16/2015  . Demand ischemia on 1/20 from acute blood loss anemia ([place]) 12/16/2015  . Abdominal wall fluid collections   . Acute encephalopathy 12/12/2015  . Seizures ([place]) 12/12/2015  . Acute kidney injury ([place]) 12/12/2015  . Controlled diabetes mellitus with circulatory  complication, without long-term current use of insulin ([place]) 12/12/2015  . Anemia of chronic disease 12/12/2015  . Sepsis ([place]) 12/12/2015  . Leukocytosis 12/12/2015  . Thrombocytopenia ([place]) 12/12/2015  . Palliative care encounter   . Acute respiratory failure with hypoxemia ([place]) 12/06/2015  . DVT of axillary vein, acute right ([place])   . Small bowel obstruction & perforation s/p OR exploration & repair 12/01/2015 12/02/2015  . Purulent peritonitis ([place]) 12/02/2015  . Protein-calorie malnutrition, severe ([place]) 07/30/2015    Past Surgical History:  Procedure Laterality Date  . ABDOMINAL AORTAGRAM N/A 11/11/2011   Procedure: ABDOMINAL Maxcine Ham;  Surgeon: Angelia Mould, MD;  Location: [facility] CATH LAB;  Service: Cardiovascular;  Laterality: N/A;  . ABDOMINAL AORTOGRAM W/LOWER EXTREMITY N/A 11/13/2017   Procedure: ABDOMINAL AORTOGRAM W/LOWER EXTREMITY;  Surgeon: Serafina Mitchell, MD;  Location: [place] CV LAB;  Service: Cardiovascular;  Laterality: N/A;  . AORTA - BILATERAL FEMORAL ARTERY BYPASS GRAFT  11/25/2011   Procedure: AORTA BIFEMORAL BYPASS GRAFT;  Surgeon: Rosetta Posner, MD;  Location: [place];  Service: Vascular;  Laterality: N/A;  . COLONOSCOPY    . FEMORAL-FEMORAL BYPASS GRAFT Right 11/14/2017   Procedure: REDO BYPASS  FEMORAL-FEMORAL ARTERY EXPOSURE;  Surgeon: Serafina Mitchell, MD;  Location: [place];  Service: Vascular;  Laterality: Right;  . FEMORAL-POPLITEAL BYPASS GRAFT Left 07/28/2015   Procedure: LEFT FEMORAL-POPLITEAL ARTERY BYPASS GRAFT;  Surgeon: Rosetta Posner, MD;  Location: [place];  Service: Vascular;  Laterality: Left;  . ILIAC  ARTERY STENT  05/06/2011  . INGUINAL HERNIA REPAIR Right   . LAPAROTOMY N/A 12/01/2015   Procedure: EXPLORATORY LAPAROTOMY WITH RADICAL PERITONEAL DEBRIDEMENT, CLOSURE OF SMALL BOWEL PERFORATION AND LYSIS OF ADHESIONS.;  Surgeon: Johnathan Hausen, MD;  Location: [facility];  Service: General;  Laterality: N/A;  . LEFT HEART CATHETERIZATION WITH CORONARY  ANGIOGRAM N/A 06/07/2013   Procedure: LEFT HEART CATHETERIZATION WITH CORONARY ANGIOGRAM;  Surgeon: Sherren Mocha, MD;  Location: [facility] CATH LAB;  Service: Cardiovascular;  Laterality: N/A;  . LEFT HEART CATHETERIZATION WITH CORONARY ANGIOGRAM N/A 12/10/2013   Procedure: LEFT HEART CATHETERIZATION WITH CORONARY ANGIOGRAM;  Surgeon: Blane Ohara, MD;  Location: [facility] CATH LAB;  Service: Cardiovascular;  Laterality: N/A;  . LOWER EXTREMITY ANGIOGRAM Bilateral 11/11/2011   Procedure: LOWER EXTREMITY ANGIOGRAM;  Surgeon: Angelia Mould, MD;  Location: [facility] CATH LAB;  Service: Cardiovascular;  Laterality: Bilateral;  . PATCH ANGIOPLASTY N/A 11/14/2017   Procedure: [place];  Surgeon: Serafina Mitchell, MD;  Location: [facility] OR;  Service: Vascular;  Laterality: N/A;  . PERCUTANEOUS CORONARY STENT INTERVENTION (PCI-S) Right 06/07/2013   Procedure: PERCUTANEOUS CORONARY STENT INTERVENTION (PCI-S);  Surgeon: Sherren Mocha, MD;  Location: [facility] CATH LAB;  Service: Cardiovascular;  Laterality: Right;  . PERIPHERAL VASCULAR CATHETERIZATION N/A 07/26/2015   Procedure: Abdominal Aortogram;  Surgeon: Serafina Mitchell, MD;  Location: [place] CV LAB;  Service: Cardiovascular;  Laterality: N/A;  . PERIPHERAL VASCULAR CATHETERIZATION Bilateral 07/26/2015   Procedure: Lower Extremity Angiography;  Surgeon: Serafina Mitchell, MD;  Location: [place] CV LAB;  Service: Cardiovascular;  Laterality: Bilateral;  . PR VEIN BYPASS GRAFT,AORTO-FEM-POP  11/24/2012  . THROMBECTOMY FEMORAL ARTERY N/A 11/14/2017   Procedure: THROMBECTOMY AORTA  BI-FEMORAL;  Surgeon: Serafina Mitchell, MD;  Location: [facility] OR;  Service: Vascular;  Laterality: N/A;  . TONSILLECTOMY    . ULTRASOUND GUIDANCE FOR VASCULAR ACCESS  11/13/2017   Procedure: Ultrasound Guidance For Vascular Access;  Surgeon: Serafina Mitchell, MD;  Location: [place] CV LAB;  Service: Cardiovascular;;       Family History  Problem  Relation Age of Onset  . Hypertension Mother   . Heart attack Mother   . Diabetes Mother   . Heart disease Mother   . Aneurysm Father   . Heart disease Father   . Heart attack Sister   . Heart disease Sister   . Heart disease Brother        heart transplant    Social History   Tobacco Use  . Smoking status: Former Smoker    Packs/day: 0.50    Years: 46.00    Pack years: 23.00    Types: Cigarettes    Quit date: 11/12/2017    Years since quitting: 2.3  . Smokeless tobacco: Never Used  . Tobacco comment: 12 cigarettes per day.   Substance Use Topics  . Alcohol use: No    Alcohol/week: 0.0 standard drinks    Comment: a12/17/20214 "last drink was 3-4 yr ago; never had problem w/it"  . Drug use: No    Frequency: 1.0 times per week    Types: Marijuana, Cocaine    Comment: stopped marijuana May 07, 2012, stopped cocaine 1980    Home Medications Prior to Admission medications   Medication Sig Start Date End Date Taking? Authorizing Provider  acetaminophen (TYLENOL) 500 MG tablet Take 500 mg by mouth every 6 (six) hours as needed (for headaches).     [provider]  zolpidem Lorrin Mais)  10 MG tablet Take 10 mg by mouth at bedtime as needed for sleep.     [provider]    Allergies    Ezetimibe, Atorvastatin, Crestor [rosuvastatin], Lisinopril, Pravastatin sodium, and Promethazine  Review of Systems   Review of Systems Ten systems reviewed and are negative for acute change, except as noted in the HPI.   Physical Exam Updated Vital Signs BP (!) 157/87 (BP Location: Left Arm)   Pulse 62   Temp 98 F (36.7 C) (Oral)   Resp 17   Ht 6\' 3"  (1.905 m)   Wt 65.3 kg   SpO2 100%   BMI 17.99 kg/m   Physical Exam Vitals and nursing note reviewed.  Constitutional:      General: He is not in acute distress.    Appearance: He is well-developed. He is not diaphoretic.  HENT:     Head: Normocephalic and atraumatic.  Eyes:     General: No scleral icterus.     Conjunctiva/sclera: Conjunctivae normal.  Cardiovascular:     Rate and Rhythm: Normal rate and regular rhythm.     Pulses:          Femoral pulses are 3+ on the right side and 0 on the left side.      Popliteal pulses are 0 on the left side.       Dorsalis pedis pulses are detected w/ Doppler on the right side and 0 on the left side.       Posterior tibial pulses are detected w/ Doppler on the right side and 0 on the left side.     Heart sounds: Normal heart sounds.     Comments: Patient with cold, pale white toes on the left.  There is some erythematous skin in the heel with 10-second capillary refill.  Unable to Doppler pulses in the foot, unable to palpate popliteal pulses in the left leg.  I can hear a faint bruit on full volume with a Doppler on the left side. Pulmonary:     Effort: Pulmonary effort is normal. No respiratory distress.     Breath sounds: Normal breath sounds.  Abdominal:     Palpations: Abdomen is soft.     Tenderness: There is no abdominal tenderness.  Musculoskeletal:     Cervical back: Normal range of motion and neck supple.  Skin:    General: Skin is warm and dry.  Neurological:     Mental Status: He is alert.  Psychiatric:        Behavior: Behavior normal.     ED Results / Procedures / Treatments   Labs (all labs ordered are listed, but only abnormal results are displayed) Labs Reviewed  RESPIRATORY PANEL BY RT PCR (FLU A&B, COVID)  COMPREHENSIVE METABOLIC PANEL  CBC WITH DIFFERENTIAL/PLATELET  LACTIC ACID, PLASMA  LACTIC ACID, PLASMA    EKG None  Radiology No results found.  Procedures .Critical Care Performed by: Margarita Mail, PA-C Authorized by: Margarita Mail, PA-C   Critical care provider statement:    Critical care time (minutes):  60   Critical care time was exclusive of:  Separately billable procedures and treating other patients   Critical care was necessary to treat or prevent imminent or life-threatening deterioration of  the following conditions:  Circulatory failure (Ischemic leg)   Critical care was time spent personally by me on the following activities:  Discussions with consultants, evaluation of patient's response to treatment, examination of patient, ordering and performing treatments and interventions, ordering and  review of laboratory studies, ordering and review of radiographic studies, pulse oximetry, re-evaluation of patient's condition, obtaining history from patient or surrogate and review of old charts   (including critical care time)  Medications Ordered in ED Medications  morphine 4 MG/ML injection 4 mg (has no administration in time range)  ondansetron (ZOFRAN) injection 4 mg (has no administration in time range)    ED Course  I have reviewed the triage vital signs and the nursing notes.  Pertinent labs & imaging results that were available during my care of the patient were reviewed by me and considered in my medical decision making (see chart for details).  Clinical Course as of Mar 15 722  Wed Mar 15, 2020  0717 I spoke with Dr. Donnetta Hutching who asks that we make the patient NPO and will come see him urgently   [AH]    Clinical Course User Index [AH] Margarita Mail, PA-C   MDM Rules/Calculators/A&P                     This is a 69 year old male with history of peripheral arterial disease who presents with left leg pain and concern for acute ischemia.  I personally ordered, interpreted and reviewed labs which include a Covid test which is negative for Covid or flu A/B, lactic acid which is in normal limits, CBC shows no leukocytosis.  CMP shows mildly low sodium in the setting of elevated blood glucose which is otherwise insignificant.  Patient has an ischemic leg from the groin to the foot.  I discussed the case with Dr. Donnetta Hutching who saw the patient at bedside and asked for CT angiogram to help determine the course of the artery but it does agree the patient has ischemic leg and does not  recommend giving IV heparin at this time.  The patient will be taken to surgery.  CT angiogram is ordered will be reviewed by Dr. Donnetta Hutching.  The patient was given multiple rounds of pain medication due to severe ischemia which appears acute/subacute.  Patient is stable throughout his ER visit. Final Clinical Impression(s) / ED Diagnoses Final diagnoses:  None    Rx / DC Orders ED Discharge Orders    None       Margarita Mail, PA-C 03/15/20 1726    Tegeler, Gwenyth Allegra, MD 03/15/20 2112

## 2020-03-15 NOTE — Consult Note (Addendum)
History of Present Illness:  Patient is a 69 y.o. year old male who presents for evaluation of claudication and rest pain since Saturday.   He states his left leg feels the same as his right did when he had an occlusion years ago.    He States on Saturday he was pressure washing and his leg felt numb, he states that over the next few days he had increased pain in the left LE.  He denise new ulcers.    He is status post aortobifemoral bypass for aortic occlusion.  Subsequently underwent left femoral to popliteal bypass for extensive gangrene over the dorsum of his left foot.  He has on one occasion had thrombosis of his right limb of his aortofemoral bypass and had thrombectomy of this.  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   Statin Allergy  Asa no  Anticoagulation No  Past Medical History:  Diagnosis Date  . Arthritis   . Diabetes mellitus without complication ([place])   . GERD (gastroesophageal reflux disease)   . Hypertension   . PAD (peripheral artery disease) ([place])     Past Surgical History:  Procedure Laterality Date  . ABDOMINAL AORTAGRAM N/A 11/11/2011   Procedure: ABDOMINAL Maxcine Ham;  Surgeon: Angelia Mould, MD;  Location: [facility] CATH LAB;  Service: Cardiovascular;  Laterality: N/A;  . ABDOMINAL AORTOGRAM W/LOWER EXTREMITY N/A 11/13/2017   Procedure: ABDOMINAL AORTOGRAM W/LOWER EXTREMITY;  Surgeon: Serafina Mitchell, MD;  Location: [place] CV LAB;  Service: Cardiovascular;  Laterality: N/A;  . AORTA - BILATERAL FEMORAL ARTERY BYPASS GRAFT  11/25/2011   Procedure: AORTA BIFEMORAL BYPASS GRAFT;  Surgeon: Rosetta Posner, MD;  Location: [place];  Service: Vascular;  Laterality: N/A;  . COLONOSCOPY    . FEMORAL-FEMORAL BYPASS GRAFT Right 11/14/2017   Procedure: REDO BYPASS  FEMORAL-FEMORAL ARTERY EXPOSURE;  Surgeon: Serafina Mitchell, MD;  Location: [place];  Service:  Vascular;  Laterality: Right;  . FEMORAL-POPLITEAL BYPASS GRAFT Left 07/28/2015   Procedure: LEFT FEMORAL-POPLITEAL ARTERY BYPASS GRAFT;  Surgeon: Rosetta Posner, MD;  Location: [place];  Service: Vascular;  Laterality: Left;  . ILIAC ARTERY STENT  05/06/2011  . INGUINAL HERNIA REPAIR Right   . LAPAROTOMY N/A 12/01/2015   Procedure: EXPLORATORY LAPAROTOMY WITH RADICAL PERITONEAL DEBRIDEMENT, CLOSURE OF SMALL BOWEL PERFORATION AND LYSIS OF ADHESIONS.;  Surgeon: Johnathan Hausen, MD;  Location: [facility];  Service: General;  Laterality: N/A;  . LEFT HEART CATHETERIZATION WITH CORONARY ANGIOGRAM N/A 06/07/2013   Procedure: LEFT HEART CATHETERIZATION WITH CORONARY ANGIOGRAM;  Surgeon: Sherren Mocha, MD;  Location: [facility] CATH LAB;  Service: Cardiovascular;  Laterality: N/A;  . LEFT HEART CATHETERIZATION WITH CORONARY ANGIOGRAM N/A 12/10/2013   Procedure: LEFT HEART CATHETERIZATION WITH CORONARY ANGIOGRAM;  Surgeon: [place] Ohara, MD;  Location: [facility] CATH LAB;  Service: Cardiovascular;  Laterality: N/A;  . LOWER EXTREMITY ANGIOGRAM Bilateral 11/11/2011   Procedure: LOWER EXTREMITY ANGIOGRAM;  Surgeon: Angelia Mould, MD;  Location: [facility] CATH LAB;  Service: Cardiovascular;  Laterality: Bilateral;  . PATCH ANGIOPLASTY N/A 11/14/2017   Procedure: [place];  Surgeon: Serafina Mitchell, MD;  Location: [facility] OR;  Service: Vascular;  Laterality: N/A;  . PERCUTANEOUS CORONARY STENT INTERVENTION (PCI-S) Right 06/07/2013   Procedure: PERCUTANEOUS CORONARY STENT INTERVENTION (PCI-S);  Surgeon: Sherren Mocha, MD;  Location: [facility] CATH LAB;  Service: Cardiovascular;  Laterality: Right;  . PERIPHERAL  VASCULAR CATHETERIZATION N/A 07/26/2015   Procedure: Abdominal Aortogram;  Surgeon: Serafina Mitchell, MD;  Location: [place] CV LAB;  Service: Cardiovascular;  Laterality: N/A;  . PERIPHERAL VASCULAR CATHETERIZATION Bilateral 07/26/2015   Procedure: Lower Extremity Angiography;  Surgeon: Serafina Mitchell, MD;  Location: [place] CV LAB;  Service: Cardiovascular;  Laterality: Bilateral;  . PR VEIN BYPASS GRAFT,AORTO-FEM-POP  11/24/2012  . THROMBECTOMY FEMORAL ARTERY N/A 11/14/2017   Procedure: THROMBECTOMY AORTA  BI-FEMORAL;  Surgeon: Serafina Mitchell, MD;  Location: [facility] OR;  Service: Vascular;  Laterality: N/A;  . TONSILLECTOMY    . ULTRASOUND GUIDANCE FOR VASCULAR ACCESS  11/13/2017   Procedure: Ultrasound Guidance For Vascular Access;  Surgeon: Serafina Mitchell, MD;  Location: [place] CV LAB;  Service: Cardiovascular;;    ROS:   General:  No weight loss, Fever, chills  HEENT: No recent headaches, no nasal bleeding, no visual changes, no sore throat  Neurologic: No dizziness, blackouts, seizures. No recent symptoms of stroke or mini- stroke. No recent episodes of slurred speech, or temporary blindness.  Cardiac: No recent episodes of chest pain/pressure, no shortness of breath at rest.  No shortness of breath with exertion.  Denies history of atrial fibrillation or irregular heartbeat  Vascular: + history of rest pain in feet.  + history of claudication.  No history of non-healing ulcer, No history of DVT   Pulmonary: No home oxygen, no productive cough, no hemoptysis,  No asthma or wheezing  Musculoskeletal:  [ ]  Arthritis, [ ]  Low back pain,  [ ]  Joint pain  Hematologic:No history of hypercoagulable state.  No history of easy bleeding.  No history of anemia  Gastrointestinal: No hematochezia or melena,  No gastroesophageal reflux, no trouble swallowing  Urinary: [ ]  chronic Kidney disease, [ ]  on HD - [ ]  MWF or [ ]  TTHS, [ ]  Burning with urination, [ ]  Frequent urination, [ ]  Difficulty urinating;   Skin: No rashes  Psychological: No history of anxiety,  No history of depression  Social History Social History   Tobacco Use  . Smoking status: Former Smoker    Packs/day: 0.50    Years: 46.00    Pack years: 23.00    Types: Cigarettes    Quit date: 11/12/2017     Years since quitting: 2.3  . Smokeless tobacco: Never Used  . Tobacco comment: 12 cigarettes per day.   Substance Use Topics  . Alcohol use: No    Alcohol/week: 0.0 standard drinks    Comment: a12/17/20214 "last drink was 3-4 yr ago; never had problem w/it"  . Drug use: No    Frequency: 1.0 times per week    Types: Marijuana, Cocaine    Comment: stopped marijuana May 07, 2012, stopped cocaine 1980    Family History Family History  Problem Relation Age of Onset  . Hypertension Mother   . Heart attack Mother   . Diabetes Mother   . Heart disease Mother   . Aneurysm Father   . Heart disease Father   . Heart attack Sister   . Heart disease Sister   . Heart disease Brother        heart transplant    Allergies  Allergies  Allergen Reactions  . Ezetimibe Anaphylaxis and Swelling    Tongue and throat swell  . Atorvastatin Other (See Comments) and Rash    Malaise & muscle weakness  . Crestor [Rosuvastatin] Rash and Other (See Comments)    Malaise & muscle weakness  .  Lisinopril Rash  . Pravastatin Sodium Other (See Comments)    "Pravastatin 40 mg qday and 40 mg q M/W/F caused muscle aches"   . Promethazine Rash     No current facility-administered medications for this encounter.   Current Outpatient Medications  Medication Sig Dispense Refill  . acetaminophen (TYLENOL) 500 MG tablet Take 500 mg by mouth every 6 (six) hours as needed (for headaches).     . zolpidem (AMBIEN) 10 MG tablet Take 10 mg by mouth at bedtime as needed for sleep.       Physical Examination  Vitals:   03/15/20 0353 03/15/20 0651 03/15/20 0730 03/15/20 0815  BP: (!) 156/101 (!) 157/87 (!) 161/94 (!) 145/96  Pulse: 76 62 (!) 59 (!) 58  Resp: 16 17 16 16   Temp: 98 F (36.7 C)     TempSrc: Oral     SpO2: 98% 100% 100% 97%  Weight: 65.3 kg     Height: 6\' 3"  (1.905 m)       Body mass index is 17.99 kg/m.  General:  Alert and oriented, no acute distress HEENT: Normal Neck: No bruit  or JVD Pulmonary: Clear to auscultation bilaterally Cardiac: Regular Rate and Rhythm without murmur Abdomen: Soft, non-tender, non-distended, no mass, no scars Skin: No rash Extremity Pulses:  2+ radial, brachial, right palpable, left fait palpable femoral, non palpable dorsalis pedis, posterior tibial pulses bilaterally Musculoskeletal: No deformity or edema  Neurologic: Upper and lower extremity motor intact uses hands to move left leg sit to supine position changes  DATA:  Pending CTA    ASSESSMENT:   Status post aortobifemoral bypass for aortic occlusion.  Subsequently underwent left femoral to popliteal bypass for extensive gangrene over the dorsum of his left foot.   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   Acute left LE stenosis with symptoms.  PLAN:  We will review the CTA and plan o OR for thrombectomy verse redo left LE bypass with Dr. Donnetta Hutching.  NPO.   Roxy Horseman PA-C [facility] Office: [phone]  I have examined the patient, reviewed and agree with above.  Patient has a extensive past history lower extremity arterial insufficiency.  Initially presented in March 2011 with severe bilateral lower extremity occlusion disease.  He underwent left common iliac artery angioplasty and stenting and left common femoral endarterectomy and a left to right femorofemoral bypass.  He had Bilaal Leib failure of this and a year and a half later in December 2012 underwent aortofemoral bypass.  He had known chronic SFA occlusions.  He presented then in 2016 in August with extensive gangrenous changes on the dorsum of his left foot and underwent urgent left femoral to below-knee popliteal bypass with saphenous vein.  At that time he had peroneal and posterior tibial runoff.  He eventually healed his foot.  He then presented in December 2018 with occlusion of the right  limb of his aortofemoral bypass and in December 2018 underwent thrombectomy of his right limb of his aortofemoral bypass and patch angioplasty of his right common femoral artery.  He presents now with a 2-day history of severe ischemia of his left lower extremity.  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  He has no left femoral pulse.  Has bounding right femoral pulse.  He clearly  is occluded the left limb of his aortofemoral graft.  He has known chronic occlusion of the left femoral to popliteal graft.  He underwent a CT angiogram.  I did review his prior arteriogram from December 2018.  At that time his left limb of his aortofemoral graft was anastomosed to his profundus femoris artery and he had no common femoral noted.  On today's CT scan he has some small branches of his profunda.  He will be taken emergently to the operating room for attempted thrombectomy and possible femorofemoral bypass and also possible left femoral to popliteal bypass.  He understands that this is a limb threatening if we cannot achieve patency of his bypass  Curt Jews, MD 03/15/2020 11:26 AM

## 2020-03-15 NOTE — ED Notes (Signed)
Pt changed into pt gown. Belongings at bedside sent with pt to short stay.

## 2020-03-15 NOTE — Anesthesia Procedure Notes (Signed)
Arterial Line Insertion Start/End4/21/2021 11:30 AM, 03/15/2020 11:35 AM Performed by: Teressa Lower., CRNA, CRNA  Patient location: Pre-op. Preanesthetic checklist: patient identified, IV checked, site marked, risks and benefits discussed, surgical consent, monitors and equipment checked, pre-op evaluation, timeout performed and anesthesia consent Lidocaine 1% used for infiltration Right, radial was placed Catheter size: 20 G Hand hygiene performed , maximum sterile barriers used  and Seldinger technique used Allen's test indicative of satisfactory collateral circulation Attempts: 1 Procedure performed without using ultrasound guided technique. Following insertion, dressing applied and Biopatch. Post procedure assessment: normal and unchanged  Patient tolerated the procedure well with no immediate complications.

## 2020-03-15 NOTE — Transfer of Care (Signed)
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  Last Pain:  Vitals:   03/15/20 1049  TempSrc:   PainSc: 5          Complications: No apparent anesthesia complications

## 2020-03-15 NOTE — Progress Notes (Signed)
Patient ID: [name] [name], male   DOB: 1951/07/03, 69 y.o.   MRN: [id number] Comfortable in PACU.  Left groin without hematoma.  2+ left femoral pulse.  Patient does not have audible flow in his foot.  Suspect this is related to his foot being cool.  He does have normal motor function and slight diminished sensory function as preop.  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

## 2020-03-15 NOTE — ED Triage Notes (Signed)
Pt c/o 10/10 bilateral leg pain and feeling cool to touch pt concern about femoral artery blockage.

## 2020-03-15 NOTE — Anesthesia Preprocedure Evaluation (Signed)
Anesthesia Evaluation  Patient identified by MRN, date of birth, ID band Patient awake    Reviewed: Allergy & Precautions, H&P , NPO status , Patient's Chart, lab work & pertinent test results  Airway Mallampati: II   Neck ROM: full    Dental   Pulmonary Patient abstained from smoking., former smoker,    breath sounds clear to auscultation       Cardiovascular hypertension, + CAD and + Peripheral Vascular Disease   Rhythm:regular Rate:Normal     Neuro/Psych Seizures -,  PSYCHIATRIC DISORDERS Depression  Neuromuscular disease    GI/Hepatic GERD  ,  Endo/Other  diabetes  Renal/GU      Musculoskeletal  (+) Arthritis ,   Abdominal   Peds  Hematology   Anesthesia Other Findings   Reproductive/Obstetrics                             Anesthesia Physical Anesthesia Plan  ASA: III  Anesthesia Plan: General   Post-op Pain Management:    Induction: Intravenous  PONV Risk Score and Plan: 2 and Ondansetron, Dexamethasone, Midazolam and Treatment may vary due to age or medical condition  Airway Management Planned: Oral ETT  Additional Equipment: Arterial line  Intra-op Plan:   Post-operative Plan: Extubation in OR  Informed Consent: I have reviewed the patients History and Physical, chart, labs and discussed the procedure including the risks, benefits and alternatives for the proposed anesthesia with the patient or authorized representative who has indicated his/her understanding and acceptance.       Plan Discussed with: CRNA, Anesthesiologist and Surgeon  Anesthesia Plan Comments:         Anesthesia Quick Evaluation

## 2020-03-15 NOTE — ED Notes (Signed)
EDP at bedside  

## 2020-03-16 ENCOUNTER — Inpatient Hospital Stay (HOSPITAL_COMMUNITY): Payer: Medicare Other | Admitting: Certified Registered Nurse Anesthetist

## 2020-03-16 ENCOUNTER — Encounter (HOSPITAL_COMMUNITY)
Admission: EM | Disposition: A | Discharge status: Home / Self Care | Payer: Self-pay | Source: Home / Self Care | Attending: Vascular Surgery

## 2020-03-16 ENCOUNTER — Encounter (HOSPITAL_COMMUNITY): Payer: Self-pay | Admitting: Vascular Surgery

## 2020-03-16 DIAGNOSIS — E1151 Type 2 diabetes mellitus with diabetic peripheral angiopathy without gangrene: Secondary | ICD-10-CM | POA: Diagnosis not present

## 2020-03-16 DIAGNOSIS — I998 Other disorder of circulatory system: Secondary | ICD-10-CM | POA: Diagnosis not present

## 2020-03-16 DIAGNOSIS — T82868A Thrombosis of vascular prosthetic devices, implants and grafts, initial encounter: Secondary | ICD-10-CM

## 2020-03-16 DIAGNOSIS — I70222 Atherosclerosis of native arteries of extremities with rest pain, left leg: Secondary | ICD-10-CM | POA: Diagnosis not present

## 2020-03-16 DIAGNOSIS — Z20822 Contact with and (suspected) exposure to covid-19: Secondary | ICD-10-CM | POA: Diagnosis not present

## 2020-03-16 HISTORY — PX: THROMBECTOMY W/ EMBOLECTOMY: SHX2507

## 2020-03-16 LAB — CBC
HCT: 40.2 % (ref 39.0–52.0)
Hemoglobin: 13.2 g/dL (ref 13.0–17.0)
MCH: 30.1 pg (ref 26.0–34.0)
MCHC: 32.8 g/dL (ref 30.0–36.0)
MCV: 91.6 fL (ref 80.0–100.0)
Platelets: 115 10*3/uL — ABNORMAL LOW (ref 150–400)
RBC: 4.39 MIL/uL (ref 4.22–5.81)
RDW: 13.8 % (ref 11.5–15.5)
WBC: 7.3 10*3/uL (ref 4.0–10.5)
nRBC: 0 % (ref 0.0–0.2)

## 2020-03-16 LAB — GLUCOSE, CAPILLARY
Glucose-Capillary: 213 mg/dL — ABNORMAL HIGH (ref 70–99)
Glucose-Capillary: 165 mg/dL — ABNORMAL HIGH (ref 70–99)
Glucose-Capillary: 183 mg/dL — ABNORMAL HIGH (ref 70–99)
Glucose-Capillary: 198 mg/dL — ABNORMAL HIGH (ref 70–99)
Glucose-Capillary: 191 mg/dL — ABNORMAL HIGH (ref 70–99)

## 2020-03-16 LAB — BASIC METABOLIC PANEL
Anion gap: 9 (ref 5–15)
BUN: 17 mg/dL (ref 8–23)
CO2: 23 mmol/L (ref 22–32)
Calcium: 9.3 mg/dL (ref 8.9–10.3)
Chloride: 101 mmol/L (ref 98–111)
Creatinine, Ser: 0.94 mg/dL (ref 0.61–1.24)
GFR calc Af Amer: >60 mL/min (ref >60)
GFR calc non Af Amer: >60 mL/min (ref >60)
Glucose, Bld: 270 mg/dL — ABNORMAL HIGH (ref 70–99)
Potassium: 4.7 mmol/L (ref 3.5–5.1)
Sodium: 133 mmol/L — ABNORMAL LOW (ref 135–145)

## 2020-03-16 SURGERY — THROMBECTOMY ARTERIOVENOUS GORE-TEX GRAFT
Anesthesia: General | Site: Leg Upper | Laterality: Left

## 2020-03-16 MED ORDER — SODIUM CHLORIDE 0.9 % IV SOLN
INTRAVENOUS | Status: DC | PRN
  Administered 2020-03-16: 500 mL

## 2020-03-16 MED ORDER — LABETALOL HCL 5 MG/ML IV SOLN
INTRAVENOUS | Status: AC
  Administered 2020-03-16: 5 mg
  Filled 2020-03-16: qty 4

## 2020-03-16 MED ORDER — LIDOCAINE 2% (20 MG/ML) 5 ML SYRINGE
INTRAMUSCULAR | Status: AC
  Filled 2020-03-16: qty 5

## 2020-03-16 MED ORDER — GLYCOPYRROLATE 0.2 MG/ML IJ SOLN
INTRAMUSCULAR | Status: DC | PRN
  Administered 2020-03-16: .2 mg via INTRAVENOUS

## 2020-03-16 MED ORDER — LACTATED RINGERS IV SOLN: INTRAVENOUS | Status: DC

## 2020-03-16 MED ORDER — PHENYLEPHRINE 40 MCG/ML (10ML) SYRINGE FOR IV PUSH (FOR BLOOD PRESSURE SUPPORT)
PREFILLED_SYRINGE | INTRAVENOUS | Status: AC
  Filled 2020-03-16: qty 20

## 2020-03-16 MED ORDER — ONDANSETRON HCL 4 MG/2ML IJ SOLN
INTRAMUSCULAR | Status: AC
  Filled 2020-03-16: qty 2

## 2020-03-16 MED ORDER — EPHEDRINE SULFATE 50 MG/ML IJ SOLN
INTRAMUSCULAR | Status: DC | PRN
  Administered 2020-03-16: 5 mg via INTRAVENOUS

## 2020-03-16 MED ORDER — CEFAZOLIN SODIUM-DEXTROSE 2-4 GM/100ML-% IV SOLN
2.0000 g | Freq: Three times a day (TID) | INTRAVENOUS | Status: AC
  Administered 2020-03-16 – 2020-03-17 (×2): 2 g via INTRAVENOUS
  Filled 2020-03-16 (×2): qty 100

## 2020-03-16 MED ORDER — CEFAZOLIN SODIUM 1 G IJ SOLR
INTRAMUSCULAR | Status: AC
  Filled 2020-03-16: qty 20

## 2020-03-16 MED ORDER — SODIUM CHLORIDE 0.9 % IV SOLN
INTRAVENOUS | Status: AC
  Filled 2020-03-16: qty 1.2

## 2020-03-16 MED ORDER — SODIUM CHLORIDE 0.9 % IR SOLN
Status: DC | PRN
  Administered 2020-03-16: 2000 mL

## 2020-03-16 MED ORDER — MIDAZOLAM HCL 5 MG/5ML IJ SOLN
INTRAMUSCULAR | Status: DC | PRN
  Administered 2020-03-16: 2 mg via INTRAVENOUS

## 2020-03-16 MED ORDER — FENTANYL CITRATE (PF) 250 MCG/5ML IJ SOLN
INTRAMUSCULAR | Status: AC
  Filled 2020-03-16: qty 5

## 2020-03-16 MED ORDER — LIDOCAINE HCL (CARDIAC) PF 100 MG/5ML IV SOSY
PREFILLED_SYRINGE | INTRAVENOUS | Status: DC | PRN
  Administered 2020-03-16: 60 mg via INTRAVENOUS

## 2020-03-16 MED ORDER — SUGAMMADEX SODIUM 200 MG/2ML IV SOLN
INTRAVENOUS | Status: DC | PRN
  Administered 2020-03-16: 200 mg via INTRAVENOUS

## 2020-03-16 MED ORDER — PROPOFOL 10 MG/ML IV BOLUS
INTRAVENOUS | Status: AC
  Filled 2020-03-16: qty 20

## 2020-03-16 MED ORDER — FENTANYL CITRATE (PF) 100 MCG/2ML IJ SOLN
25.0000 ug | INTRAMUSCULAR | Status: DC | PRN
  Administered 2020-03-16 (×2): 50 ug via INTRAVENOUS

## 2020-03-16 MED ORDER — PHENYLEPHRINE HCL (PRESSORS) 10 MG/ML IV SOLN
INTRAVENOUS | Status: DC | PRN
  Administered 2020-03-16: 80 ug via INTRAVENOUS

## 2020-03-16 MED ORDER — ACETAMINOPHEN 10 MG/ML IV SOLN: 1000.0000 mg | Freq: Once | INTRAVENOUS | Status: DC | PRN

## 2020-03-16 MED ORDER — FENTANYL CITRATE (PF) 100 MCG/2ML IJ SOLN
INTRAMUSCULAR | Status: DC | PRN
  Administered 2020-03-16: 50 ug via INTRAVENOUS

## 2020-03-16 MED ORDER — FENTANYL CITRATE (PF) 100 MCG/2ML IJ SOLN
INTRAMUSCULAR | Status: AC
  Filled 2020-03-16: qty 2

## 2020-03-16 MED ORDER — ONDANSETRON HCL 4 MG/2ML IJ SOLN: 4.0000 mg | Freq: Once | INTRAMUSCULAR | Status: DC | PRN

## 2020-03-16 MED ORDER — GLYCOPYRROLATE PF 0.2 MG/ML IJ SOSY
PREFILLED_SYRINGE | INTRAMUSCULAR | Status: AC
  Filled 2020-03-16: qty 1

## 2020-03-16 MED ORDER — MIDAZOLAM HCL 2 MG/2ML IJ SOLN
INTRAMUSCULAR | Status: AC
  Filled 2020-03-16: qty 2

## 2020-03-16 MED ORDER — ROCURONIUM BROMIDE 10 MG/ML (PF) SYRINGE
PREFILLED_SYRINGE | INTRAVENOUS | Status: AC
  Filled 2020-03-16: qty 10

## 2020-03-16 MED ORDER — PROPOFOL 10 MG/ML IV BOLUS
INTRAVENOUS | Status: DC | PRN
  Administered 2020-03-16: 100 mg via INTRAVENOUS

## 2020-03-16 MED ORDER — CEFAZOLIN SODIUM-DEXTROSE 1-4 GM/50ML-% IV SOLN
INTRAVENOUS | Status: DC | PRN
  Administered 2020-03-16: 2 g via INTRAVENOUS

## 2020-03-16 MED ORDER — PHENYLEPHRINE HCL-NACL 10-0.9 MG/250ML-% IV SOLN
INTRAVENOUS | Status: DC | PRN
  Administered 2020-03-16: 40 ug/min via INTRAVENOUS

## 2020-03-16 MED ORDER — FENTANYL CITRATE (PF) 100 MCG/2ML IJ SOLN
INTRAMUSCULAR | Status: DC | PRN
  Administered 2020-03-16 (×4): 50 ug via INTRAVENOUS

## 2020-03-16 MED ORDER — INSULIN ASPART 100 UNIT/ML ~~LOC~~ SOLN
0.0000 [IU] | Freq: Three times a day (TID) | SUBCUTANEOUS | Status: DC
  Administered 2020-03-16 – 2020-03-18 (×4): 2 [IU] via SUBCUTANEOUS
  Administered 2020-03-18: 1 [IU] via SUBCUTANEOUS

## 2020-03-16 MED ORDER — OXYCODONE-ACETAMINOPHEN 5-325 MG PO TABS
ORAL_TABLET | ORAL | Status: AC
  Filled 2020-03-16: qty 2

## 2020-03-16 MED ORDER — HEPARIN SODIUM (PORCINE) 1000 UNIT/ML IJ SOLN
INTRAMUSCULAR | Status: DC | PRN
  Administered 2020-03-16: 7000 [IU] via INTRAVENOUS

## 2020-03-16 MED ORDER — EPHEDRINE 5 MG/ML INJ
INTRAVENOUS | Status: AC
  Filled 2020-03-16: qty 10

## 2020-03-16 MED ORDER — ROCURONIUM BROMIDE 100 MG/10ML IV SOLN
INTRAVENOUS | Status: DC | PRN
  Administered 2020-03-16: 20 mg via INTRAVENOUS
  Administered 2020-03-16: 40 mg via INTRAVENOUS

## 2020-03-16 SURGICAL SUPPLY — 39 items
ARMBAND PINK RESTRICT EXTREMIT (MISCELLANEOUS) ×2 IMPLANT
CANISTER SUCT 3000ML PPV (MISCELLANEOUS) ×2 IMPLANT
CANNULA VESSEL 3MM 2 BLNT TIP (CANNULA) ×4 IMPLANT
CATH EMB 3FR 80CM (CATHETERS) ×2 IMPLANT
CATH EMB 4FR 80CM (CATHETERS) IMPLANT
CATH EMB 5FR 80CM (CATHETERS) ×2 IMPLANT
CLIP LIGATING EXTRA MED SLVR (CLIP) ×2 IMPLANT
CLIP LIGATING EXTRA SM BLUE (MISCELLANEOUS) ×2 IMPLANT
COVER WAND RF STERILE (DRAPES) IMPLANT
DECANTER SPIKE VIAL GLASS SM (MISCELLANEOUS) ×2 IMPLANT
DERMABOND ADVANCED (GAUZE/BANDAGES/DRESSINGS) ×1
DERMABOND ADVANCED .7 DNX12 (GAUZE/BANDAGES/DRESSINGS) ×1 IMPLANT
ELECT REM PT RETURN 9FT ADLT (ELECTROSURGICAL) ×2
ELECTRODE REM PT RTRN 9FT ADLT (ELECTROSURGICAL) ×1 IMPLANT
GLOVE BIO SURGEON STRL SZ 6.5 (GLOVE) ×4 IMPLANT
GLOVE BIOGEL PI IND STRL 6.5 (GLOVE) ×2 IMPLANT
GLOVE BIOGEL PI IND STRL 7.0 (GLOVE) ×1 IMPLANT
GLOVE BIOGEL PI INDICATOR 6.5 (GLOVE) ×2
GLOVE BIOGEL PI INDICATOR 7.0 (GLOVE) ×1
GLOVE ECLIPSE 6.5 STRL STRAW (GLOVE) ×2 IMPLANT
GLOVE SS BIOGEL STRL SZ 7.5 (GLOVE) ×1 IMPLANT
GLOVE SUPERSENSE BIOGEL SZ 7.5 (GLOVE) ×1
GOWN STRL REUS W/ TWL LRG LVL3 (GOWN DISPOSABLE) ×4 IMPLANT
GOWN STRL REUS W/TWL LRG LVL3 (GOWN DISPOSABLE) ×8
KIT BASIN OR (CUSTOM PROCEDURE TRAY) ×2 IMPLANT
KIT TURNOVER KIT B (KITS) ×2 IMPLANT
NS IRRIG 1000ML POUR BTL (IV SOLUTION) ×2 IMPLANT
PACK CV ACCESS (CUSTOM PROCEDURE TRAY) ×2 IMPLANT
PAD ARMBOARD 7.5X6 YLW CONV (MISCELLANEOUS) ×4 IMPLANT
SUT PROLENE 5 0 C 1 24 (SUTURE) ×4 IMPLANT
SUT PROLENE 6 0 CC (SUTURE) ×4 IMPLANT
SUT VIC AB 2-0 CT1 27 (SUTURE) ×2
SUT VIC AB 2-0 CT1 TAPERPNT 27 (SUTURE) ×1 IMPLANT
SUT VIC AB 3-0 SH 27 (SUTURE) ×4
SUT VIC AB 3-0 SH 27X BRD (SUTURE) ×2 IMPLANT
SYR 3ML LL SCALE MARK (SYRINGE) ×2 IMPLANT
TOWEL GREEN STERILE (TOWEL DISPOSABLE) ×2 IMPLANT
UNDERPAD 30X30 (UNDERPADS AND DIAPERS) ×2 IMPLANT
WATER STERILE IRR 1000ML POUR (IV SOLUTION) ×2 IMPLANT

## 2020-03-16 NOTE — Progress Notes (Signed)
Patient ID: [name] [name], male   DOB: August 28, 1951, 69 y.o.   MRN: [id number] On physical exam today, the patient was found to have no left femoral pulse.  He does report that his foot feels better.  I do not have Doppler flow in his foot.  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  Discussed at length with the patient that we will need urgent return to the operating room this morning.  He has not had breakfast.  Unclear as to the cause of his recurrent thrombosis.  Explained that this could be either inflow or outflow or even potentially technical issue regarding his revision yesterday.  Will be prepared for potential femorofemoral however had good inflow yesterday.  Also will be prepared for femoral to popliteal bypass.  This would be difficult to do due to his known severe tibial disease and no vein for bypass.

## 2020-03-16 NOTE — Anesthesia Postprocedure Evaluation (Signed)
Anesthesia Post Note  Patient: [name] [name]  Procedure(s) Performed: LEFT LEG THROMBECTOMY with REVISION PROFUNDA BYPASS GRAFT (Left Leg Upper)     Patient location during evaluation: PACU Anesthesia Type: General Level of consciousness: awake Pain management: pain level controlled Vital Signs Assessment: post-procedure vital signs reviewed and stable Respiratory status: spontaneous breathing, nonlabored ventilation, respiratory function stable and patient connected to nasal cannula oxygen Cardiovascular status: blood pressure returned to baseline and stable Postop Assessment: no apparent nausea or vomiting Anesthetic complications: no    Last Vitals:  Vitals:   03/16/20 1213 03/16/20 1248  BP: 128/82 140/83  Pulse: (!) 46 (!) 45  Resp: 19 17  Temp:  36.6 C  SpO2: 97% 95%    Last Pain:  Vitals:   03/16/20 1248  TempSrc: Oral  PainSc:                  [name]

## 2020-03-16 NOTE — Progress Notes (Signed)
Seen/eval'd by vasc PA Maureen / movement intact, some venous filling, no pulses detected by feel or doppler

## 2020-03-16 NOTE — Anesthesia Postprocedure Evaluation (Signed)
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)     Patient location during evaluation: PACU Anesthesia Type: General Level of consciousness: awake and alert Pain management: pain level controlled Vital Signs Assessment: post-procedure vital signs reviewed and stable Respiratory status: spontaneous breathing, nonlabored ventilation, respiratory function stable and patient connected to nasal cannula oxygen Cardiovascular status: blood pressure returned to baseline and stable Postop Assessment: no apparent nausea or vomiting Anesthetic complications: no    Last Vitals:  Vitals:   03/16/20 0353 03/16/20 0400  BP: 110/69 121/77  Pulse: (!) 57 (!) 45  Resp: 14 13  Temp: 36.8 C 36.7 C  SpO2: 100% 99%    Last Pain:  Vitals:   03/16/20 0400  TempSrc: Oral  PainSc:                  [name]

## 2020-03-16 NOTE — Anesthesia Preprocedure Evaluation (Addendum)
Anesthesia Evaluation  Patient identified by MRN, date of birth, ID band Patient awake    Reviewed: Allergy & Precautions, NPO status , Patient's Chart, lab work & pertinent test results  Airway Mallampati: II  TM Distance: >3 FB Neck ROM: Full    Dental  (+) Missing   Pulmonary Current Smoker and Patient abstained from smoking.,    Pulmonary exam normal breath sounds clear to auscultation       Cardiovascular hypertension, + CAD, + Cardiac Stents and + Peripheral Vascular Disease  Normal cardiovascular exam Rhythm:Regular Rate:Normal     Neuro/Psych Seizures -, Well Controlled,  PSYCHIATRIC DISORDERS Depression    GI/Hepatic Neg liver ROS, PUD,   Endo/Other  diabetes  Renal/GU Renal disease     Musculoskeletal negative musculoskeletal ROS (+)   Abdominal   Peds  Hematology negative hematology ROS (+)   Anesthesia Other Findings LEFT FOOT ISCHEMIA  Reproductive/Obstetrics                            Anesthesia Physical Anesthesia Plan  ASA: III  Anesthesia Plan: General   Post-op Pain Management:    Induction: Intravenous  PONV Risk Score and Plan: 1 and Ondansetron, Dexamethasone and Treatment may vary due to age or medical condition  Airway Management Planned: Oral ETT  Additional Equipment:   Intra-op Plan:   Post-operative Plan: Extubation in OR  Informed Consent: I have reviewed the patients History and Physical, chart, labs and discussed the procedure including the risks, benefits and alternatives for the proposed anesthesia with the patient or authorized representative who has indicated his/her understanding and acceptance.     Dental advisory given  Plan Discussed with: CRNA  Anesthesia Plan Comments: (Potential intraoperative arterial line discussed )       Anesthesia Quick Evaluation

## 2020-03-16 NOTE — Progress Notes (Signed)
PT Cancellation Note  Patient Details Name: [name] [name] MRN: [id number] DOB: 10-04-1951   Cancelled Treatment:    Reason Eval/Treat Not Completed: Patient at procedure or test/unavailable   [name] [name] 03/16/2020, 8:08 AM  [name] [name], PT Acute Rehabilitation Services Pager: [phone] Office: [phone]

## 2020-03-16 NOTE — Op Note (Signed)
    OPERATIVE REPORT  DATE OF SURGERY: 03/16/2020  PATIENT: [name] [name], 69 y.o. 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  PHYSICIAN ASSISTANT: [facility]  ANESTHESIA: General  EBL: per anesthesia record  Total I/O In: 100 [I.V.:100] Out: 225 [Urine:200; Blood:25]  BLOOD ADMINISTERED: none  DRAINS: none  SPECIMEN: none  COUNTS CORRECT:  YES  PATIENT DISPOSITION:  PACU - hemodynamically stable  PROCEDURE DETAILS: Patient status post thrombectomy of left limb of his aortofemoral bypass and revision of the distal anastomosis today prior to the procedure.  He was found this morning to have recurrent occlusion of his graft.  He is taken back to the operating room for reexploration.  Patient was placed supine position and the area of both groins and left leg were prepped draped you sterile fashion.  The incision was reopened in the left groin and carried down to isolate the Dacron graft which was thrombosed.  The distal anastomosis was entered into the profundus femoris artery.  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  The anastomosis was taken down.  The profundus was occluded with thrombus.  Dissection was continued further distally and 2 large branches were individually isolated and controlled with red Vesseloops.  The patient was given 7000 units of intravenous heparin.  After adequate circulation time these into individual branches were thrombectomized.  There was clot only at the orifice of the more medial branch.  On the more lateral larger branch the Fogarty catheter would pass to 35 cm and more chronic.  Thrombus was removed with  excellent backbleeding.  Several additional passes were negative for any more thrombus.  These branches were reoccluded.  Next the 5 Fogarty catheter was passed up the left limb of the aortofemoral bypass.  Thrombus was removed and there was excellent arterial inflow with no sensation that there was any kinking or difficulty with the proximal inflow.  The graft was reoccluded.  The fundus femoris artery was opened further distally and additional common femoral artery was resected.  The graft was brought into approximation with the profundus femoris artery and was sewn into end with a running 5-0 Prolene suture after spatulating the profundus femoris artery.  Prior to completion of the closure the usual flushing maneuvers were undertaken.  Anastomosis was completed and clamps removed.  There was excellent Doppler flow through both profunda branches with good diastolic flow.  Patient had known chronic superficial femoral occlusion and severe tibial disease with peroneal artery runoff only.  I did not have audible signal in the foot but felt this was chronic.  The patient's heparin was not reversed with protamine.  The wounds were irrigated with saline and hemostasis obtained with cautery.  The wound was closed with several layers of 2-0 Vicryl suture to close the subcutaneous tissue.  The skin was closed with 3-0 subcuticular Vicryl suture.  Sterile dressing was applied and the patient was transferred to the recovery room in stable condition   [name] [name], M.D., [facility] 03/16/2020 11:42 AM

## 2020-03-16 NOTE — Transfer of Care (Signed)
Immediate Anesthesia Transfer of Care Note  Patient: [name] [name]  Procedure(s) Performed: LEFT LEG THROMBECTOMY with REVISION PROFUNDA BYPASS GRAFT (Left Leg Upper)  Patient Location: PACU  Anesthesia Type:General  Level of Consciousness: awake, alert  and oriented  Airway & Oxygen Therapy: Patient Spontanous Breathing and Patient connected to nasal cannula oxygen  Post-op Assessment: Report given to RN, Post -op Vital signs reviewed and stable and Patient moving all extremities  Post vital signs: Reviewed and stable  Last Vitals:  Vitals Value Taken Time  BP 161/108 03/16/20 1141  Temp    Pulse 67 03/16/20 1144  Resp 11 03/16/20 1144  SpO2 100 % 03/16/20 1144  Vitals shown include unvalidated device data.  Last Pain:  Vitals:   03/16/20 0744  TempSrc: Oral  PainSc:          Complications: No apparent anesthesia complications

## 2020-03-16 NOTE — Anesthesia Procedure Notes (Signed)
Procedure Name: Intubation Date/Time: 03/16/2020 10:14 AM Performed by: Alquan Morrish T, CRNA Pre-anesthesia Checklist: Patient identified, Emergency Drugs available, Suction available and Patient being monitored Patient Re-evaluated:Patient Re-evaluated prior to induction Oxygen Delivery Method: Circle system utilized Preoxygenation: Pre-oxygenation with 100% oxygen Induction Type: IV induction Ventilation: Mask ventilation without difficulty Laryngoscope Size: Mac and 4 Grade View: Grade I Tube type: Oral Tube size: 7.5 mm Number of attempts: 1 Airway Equipment and Method: Stylet and Oral airway Placement Confirmation: ETT inserted through vocal cords under direct vision,  positive ETCO2 and breath sounds checked- equal and bilateral Secured at: 23 cm Tube secured with: Tape Dental Injury: Teeth and Oropharynx as per pre-operative assessment

## 2020-03-16 NOTE — Progress Notes (Signed)
   03/16/20 0900  OT Visit Information  Last OT Received On 03/16/20  Reason Eval/Treat Not Completed Patient at procedure or test/ unavailable  History of Present Illness 69 yo admitted with bil LE pain s/p Rt groin false aneursym excision and thrombectomy of LLE. PMHx: bil fem BPG, PAD, DM, HTN   Tyrone Schimke, OT Acute Rehabilitation Services Pager: [phone] Office: [phone]

## 2020-03-16 NOTE — Progress Notes (Signed)
Pt called out red fluid in bed. Right groin checked. Small amount of fluid oozing from right groin. Dressing placed and bed sheet changed. No active oozing after dressing applied.

## 2020-03-16 NOTE — Progress Notes (Signed)
Patient ID: [name] [name], male   DOB: Mar 26, 1951, 69 y.o.   MRN: [id number] Comfortable in his room.  Some bleeding from his groin incision.  No evidence of hematoma.  2+ left femoral pulse  I did not reverse his heparin due to his thrombosis overnight.  Will not have him walk actively this evening and begin mobilization tomorrow.

## 2020-03-17 ENCOUNTER — Encounter (HOSPITAL_COMMUNITY): Payer: Medicare Other

## 2020-03-17 DIAGNOSIS — I998 Other disorder of circulatory system: Secondary | ICD-10-CM | POA: Diagnosis not present

## 2020-03-17 DIAGNOSIS — T82868A Thrombosis of vascular prosthetic devices, implants and grafts, initial encounter: Secondary | ICD-10-CM | POA: Diagnosis not present

## 2020-03-17 LAB — GLUCOSE, CAPILLARY
Glucose-Capillary: 187 mg/dL — ABNORMAL HIGH (ref 70–99)
Glucose-Capillary: 197 mg/dL — ABNORMAL HIGH (ref 70–99)
Glucose-Capillary: 154 mg/dL — ABNORMAL HIGH (ref 70–99)

## 2020-03-17 MED ORDER — RIVAROXABAN 2.5 MG PO TABS
2.5000 mg | ORAL_TABLET | Freq: Two times a day (BID) | ORAL | Status: DC
  Administered 2020-03-17 – 2020-03-18 (×2): 2.5 mg via ORAL
  Filled 2020-03-17 (×3): qty 1

## 2020-03-17 MED ORDER — HEPARIN (PORCINE) 25000 UT/250ML-% IV SOLN
800.0000 [IU]/h | INTRAVENOUS | Status: AC
  Administered 2020-03-17: 800 [IU]/h via INTRAVENOUS
  Filled 2020-03-17: qty 250

## 2020-03-17 NOTE — Evaluation (Signed)
Physical Therapy Evaluation Patient Details Name: [name] [name] MRN: [id number] DOB: 1951-08-23 Today's Date: 03/17/2020   History of Present Illness  69 yo admitted with bil LE pain s/p Rt groin false aneursym excision and thrombectomy of LLE 4/21. s/p Left groin exploration and thrombectomy of left limb of aortofemoral bypass and revision of distal anastomosis, into into profundus femoris artery 4/22. PMHx: bil fem BPG, PAD, DM, HTN  Clinical Impression  Patient presents with decreased sensation BLEs, RLE pain, impaired balance and impaired mobility s/p above. Pt lives alone, uses SPC vs rollator and reports being Mod I PTA with difficulty more recently due to pain. Today, pt tolerated bed mobility, transfers and gait training with Min guard assist for balance/safety. Reports feeling much better than he has in years. Encouraged walking with nursing 2-3 times daily. Will follow acutely to maximize independence and mobility prior to return home.     Follow Up Recommendations No PT follow up;Supervision for mobility/OOB    Equipment Recommendations  None recommended by PT    Recommendations for Other Services       Precautions / Restrictions Precautions Precautions: Fall Restrictions Weight Bearing Restrictions: No      Mobility  Bed Mobility Overal bed mobility: Modified Independent             General bed mobility comments: Use of rail to get to EOB.  Transfers Overall transfer level: Needs assistance Equipment used: Rolling walker (2 wheeled) Transfers: Sit to/from Stand Sit to Stand: Min guard         General transfer comment: Min guard for safety. Stood from [employer], transferred to chair post ambulation.  Ambulation/Gait Ambulation/Gait assistance: Min guard Gait Distance (Feet): 300 Feet Assistive device: Rolling walker (2 wheeled) Gait Pattern/deviations: Step-through pattern;Decreased stride length Gait velocity: decreased Gait velocity interpretation:  1.31 - 2.62 ft/sec, indicative of limited community ambulator General Gait Details: Steady gait with RW for support, cues for upright posture. Some incoordination noted BLEs. (reports hx of paralysis BLEs at 69 yo)?  Stairs            Wheelchair Mobility    Modified Rankin (Stroke Patients Only)       Balance Overall balance assessment: Needs assistance Sitting-balance support: Feet supported;No upper extremity supported Sitting balance-Leahy Scale: Good     Standing balance support: During functional activity Standing balance-Leahy Scale: Fair Standing balance comment: Does better with UE support for dynamic tasks; can stand statically wtihout UE support.                             Pertinent Vitals/Pain Pain Assessment: Faces Faces Pain Scale: Hurts little more Pain Location: lower RLE Pain Descriptors / Indicators: Shooting Pain Intervention(s): Repositioned;Monitored during session    [place] expects to be discharged to:: Private residence Living Arrangements: Alone Available Help at Discharge: Family;Available PRN/intermittently Type of Home: House Home Access: Stairs to enter Entrance Stairs-Rails: Left Entrance Stairs-Number of Steps: 2 Home Layout: One level Home Equipment: Walker - 4 wheels;Cane - single point      Prior Function Level of Independence: Independent with assistive device(s)         Comments: Uses SPC to ambulate vs rollator as needed.     Hand Dominance   Dominant Hand: Right    Extremity/Trunk Assessment   Upper Extremity Assessment Upper Extremity Assessment: Defer to OT evaluation    Lower Extremity Assessment Lower Extremity Assessment: Generalized weakness;RLE deficits/detail;LLE  deficits/detail RLE Sensation: decreased light touch;history of peripheral neuropathy LLE Sensation: decreased light touch;history of peripheral neuropathy       Communication   Communication: No difficulties   Cognition Arousal/Alertness: Awake/alert Behavior During Therapy: WFL for tasks assessed/performed Overall Cognitive Status: Within Functional Limits for tasks assessed                                 General Comments: "I haven't felt this good in years."      General Comments General comments (skin integrity, edema, etc.): VSS on RA.    Exercises General Exercises - Lower Extremity Ankle Circles/Pumps: AROM;Both;10 reps;Seated Quad Sets: AROM;Both;5 reps;Seated   Assessment/Plan    PT Assessment Patient needs continued PT services  PT Problem List Decreased strength;Decreased mobility;Pain;Impaired sensation;Decreased balance;Decreased activity tolerance       PT Treatment Interventions Therapeutic activities;Gait training;Therapeutic exercise;Patient/family education;Balance training;Functional mobility training;Stair training    PT Goals (Current goals can be found in the Care Plan section)  Acute Rehab PT Goals Patient Stated Goal: to be independent PT Goal Formulation: With patient Time For Goal Achievement: 03/31/20 Potential to Achieve Goals: Good    Frequency Min 3X/week   Barriers to discharge Decreased caregiver support;Inaccessible home environment lives alone    Co-evaluation               AM-PAC PT "6 Clicks" Mobility  Outcome Measure Help needed turning from your back to your side while in a flat bed without using bedrails?: None Help needed moving from lying on your back to sitting on the side of a flat bed without using bedrails?: A Little Help needed moving to and from a bed to a chair (including a wheelchair)?: A Little Help needed standing up from a chair using your arms (e.g., wheelchair or bedside chair)?: A Little Help needed to walk in hospital room?: A Little Help needed climbing 3-5 steps with a railing? : A Little 6 Click Score: 19    End of Session Equipment Utilized During Treatment: Gait belt Activity Tolerance:  Patient tolerated treatment well Patient left: in chair;with call bell/phone within reach Nurse Communication: Mobility status PT Visit Diagnosis: Pain;Difficulty in walking, not elsewhere classified (R26.2);Muscle weakness (generalized) (M62.81) Pain - Right/Left: Right Pain - part of body: Leg    Time: [id number] PT Time Calculation (min) (ACUTE ONLY): 27 min   Charges:   PT Evaluation $PT Eval Moderate Complexity: 1 Mod PT Treatments $Gait Training: 8-22 mins        Marisa Severin, PT, DPT Acute Rehabilitation Services Pager [phone] Office [place] 03/17/2020, 10:39 AM

## 2020-03-17 NOTE — Discharge Instructions (Signed)
[facility]  Discharge instructions  Lower Extremity Bypass Surgery  Please refer to the following instruction for your post-procedure care. Your surgeon or physician assistant will discuss any changes with you.  Activity  You are encouraged to walk as much as you can. You can slowly return to normal activities during the month after your surgery. Avoid strenuous activity and heavy lifting until your doctor tells you it's OK. Avoid activities such as vacuuming or swinging a golf club. Do not drive until your doctor give the OK and you are no longer taking prescription pain medications. It is also normal to have difficulty with sleep habits, eating and bowel movement after surgery. These will go away with time.  Bathing/Showering  Shower daily after you go home. Do not soak in a bathtub, hot tub, or swim until the incision heals completely.  Incision Care  Clean your incision with mild soap and water. Shower every day. Pat the area dry with a clean towel. You do not need a bandage unless otherwise instructed. Do not apply any ointments or creams to your incision. If you have open wounds you will be instructed how to care for them or a visiting nurse may be arranged for you. If you have staples or sutures along your incision they will be removed at your post-op appointment. You may have skin glue on your incision. Do not peel it off. It will come off on its own in about one week.  Wash the groin wound with soap and water daily and pat dry. (No tub bath-only shower)  Then put a dry gauze or washcloth in the groin to keep this area dry to help prevent wound infection.  Do this daily and as needed.  Do not use Vaseline or neosporin on your incisions.  Only use soap and water on your incisions and then protect and keep dry.  Diet  Resume your normal diet. There are no special food restrictions following this procedure. A low fat/ low cholesterol diet is  recommended for all patients with vascular disease. In order to heal from your surgery, it is CRITICAL to get adequate nutrition. Your body requires vitamins, minerals, and protein. Vegetables are the best source of vitamins and minerals. Vegetables also provide the perfect balance of protein. Processed food has little nutritional value, so try to avoid this.  Medications  Resume taking all your medications unless your doctor or physician assistant tells you not to. If your incision is causing pain, you may take over-the-counter pain relievers such as acetaminophen (Tylenol). If you were prescribed a stronger pain medication, please aware these medication can cause nausea and constipation. Prevent nausea by taking the medication with a snack or meal. Avoid constipation by drinking plenty of fluids and eating foods with high amount of fiber, such as fruits, vegetables, and grains. Take Colace 100 mg (an over-the-counter stool softener) twice a day as needed for constipation.  Do not take Tylenol if you are taking prescription pain medications.  Follow Up  Our office will schedule a follow up appointment 2-3 weeks following discharge.  Please call us immediately for any of the following conditions  .Severe or worsening pain in your legs or feet while at rest or while walking .Increase pain, redness, warmth, or drainage (pus) from your incision site(s) . Fever of 101 degree or higher . The swelling in your leg with the bypass suddenly worsens and becomes more painful than when you were in the hospital .  If you have been instructed to feel your graft pulse then you should do so every day. If you can no longer feel this pulse, call the office immediately. Not all patients are given this instruction. .  Leg swelling is common after leg bypass surgery.  The swelling should improve over a few months following surgery. To improve the swelling, you may elevate your legs above the level of your heart while  you are sitting or resting. Your surgeon or physician assistant may ask you to apply an ACE wrap or wear compression (TED) stockings to help to reduce swelling.  Reduce your risk of vascular disease  Stop smoking. If you would like help call [facility] at 1-800-QUIT-NOW 289-242-3791) or [facility] at 615-283-5327.  . Manage your cholesterol . Maintain a desired weight . Control your diabetes weight . Control your diabetes . Keep your blood pressure down .  If you have any questions, please call the office at [phone]  __________________________________________  Information on my medicine - XARELTO (rivaroxaban)  This medication education was reviewed with me or my healthcare representative as part of my discharge preparation.    WHY WAS XARELTO PRESCRIBED FOR YOU? Xarelto was prescribed to treat blood clots that may have been found in the veins of your legs (deep vein thrombosis) or in your lungs (pulmonary embolism) and to reduce the risk of them occurring again.  What do you need to know about Xarelto? The starting dose is one 2.5 mg tablet taken twice daily  DO NOT stop taking Xarelto without talking to the health care provider who prescribed the medication.  After discharge, you should have regular check-up appointments with your healthcare provider that is prescribing your Xarelto.  In the future your dose may need to be changed if your kidney function changes by a significant amount.  What do you do if you miss a dose? If you are taking Xarelto TWICE DAILY and you miss a dose, take it as soon as you remember.  Do not take two doses of Xarelto at the same time.   Important Safety Information Xarelto is a blood thinner medicine that can cause bleeding. You should call your healthcare provider right away if you experience any of the following: ? Bleeding from an injury or your nose that does not stop. ? Unusual colored urine (red or dark brown) or unusual  colored stools (red or black). ? Unusual bruising for unknown reasons. ? A serious fall or if you hit your head (even if there is no bleeding).  Some medicines may interact with Xarelto and might increase your risk of bleeding while on Xarelto. To help avoid this, consult your healthcare provider or pharmacist prior to using any new prescription or non-prescription medications, including herbals, vitamins, non-steroidal anti-inflammatory drugs (NSAIDs) and supplements.  This website has more information on Xarelto: [email or web address]

## 2020-03-17 NOTE — Evaluation (Signed)
Occupational Therapy Evaluation and Discharge Patient Details Name: [name] [name] MRN: [id number] DOB: [birth year] Today's Date: 03/17/2020    History of Present Illness 69 yo admitted with bil LE pain s/p Rt groin false aneursym excision and thrombectomy of LLE 4/21. s/p Left groin exploration and thrombectomy of left limb of aortofemoral bypass and revision of distal anastomosis, into into profundus femoris artery 4/22. PMHx: bil fem BPG, PAD, DM, HTN   Clinical Impression   Pt is functioning at a supervision level in mobility and ADL due to IV line and BP of 95/55. No further OT needs. Pt stating that walking down the hall was the farthest he had walked in 3 years. He is very pleased with the outcome of his surgery.     Follow Up Recommendations  No OT follow up    Equipment Recommendations  None recommended by OT    Recommendations for Other Services       Precautions / Restrictions Precautions Precautions: Fall Restrictions Weight Bearing Restrictions: No      Mobility Bed Mobility Overal bed mobility: Modified Independent             General bed mobility comments: to return to supine, assists LEs with his hands  Transfers Overall transfer level: Needs assistance Equipment used: Rolling walker (2 wheeled) Transfers: Sit to/from Stand Sit to Stand: Supervision         General transfer comment: good technique, supervision for safety    Balance Overall balance assessment: Needs assistance   Sitting balance-Leahy Scale: Good       Standing balance-Leahy Scale: Fair Standing balance comment: Does better with UE support for dynamic tasks; can stand statically wtihout UE support.                           ADL either performed or assessed with clinical judgement   ADL                                         General ADL Comments: Overall functioning at a supervision level for IV line and due to low BP.     Vision Baseline  Vision/History: Wears glasses Patient Visual Report: No change from baseline       Perception     Praxis      Pertinent Vitals/Pain Pain Assessment: Faces Faces Pain Scale: Hurts even more Pain Location: B LEs Pain Descriptors / Indicators: Cramping Pain Intervention(s): Repositioned;Patient requesting pain meds-RN notified     Hand Dominance Right   Extremity/Trunk Assessment Upper Extremity Assessment Upper Extremity Assessment: Overall WFL for tasks assessed   Lower Extremity Assessment Lower Extremity Assessment: Defer to PT evaluation       Communication Communication Communication: No difficulties   Cognition Arousal/Alertness: Awake/alert Behavior During Therapy: WFL for tasks assessed/performed Overall Cognitive Status: Within Functional Limits for tasks assessed                                     General Comments       Exercises     Shoulder Instructions      Home Living Family/patient expects to be discharged to:: Private residence Living Arrangements: Alone Available Help at Discharge: Family;Available PRN/intermittently Type of Home: House Home Access: Stairs to enter [employer] of Steps:  2 Entrance Stairs-Rails: Left Home Layout: One level     Bathroom Shower/Tub: Teacher, early years/pre: Standard     Home Equipment: Environmental consultant - 4 wheels;Cane - single point;Shower seat          Prior Functioning/Environment Level of Independence: Independent with assistive device(s)        Comments: Uses SPC to ambulate vs rollator as needed.        OT Problem List:        OT Treatment/Interventions:      OT Goals(Current goals can be found in the care plan section) Acute Rehab OT Goals Patient Stated Goal: to be independent  OT Frequency:     Barriers to D/C:            Co-evaluation              AM-PAC OT "6 Clicks" Daily Activity     Outcome Measure Help from another person eating  meals?: None Help from another person taking care of personal grooming?: A Little Help from another person toileting, which includes using toliet, bedpan, or urinal?: A Little Help from another person bathing (including washing, rinsing, drying)?: A Little Help from another person to put on and taking off regular upper body clothing?: None Help from another person to put on and taking off regular lower body clothing?: A Little 6 Click Score: 20   End of Session Equipment Utilized During Treatment: Rolling walker  Activity Tolerance: Patient tolerated treatment well Patient left: in bed;with call bell/phone within reach;with nursing/sitter in room  OT Visit Diagnosis: Pain;Other abnormalities of gait and mobility (R26.89)                Time: 1350-1416 OT Time Calculation (min): 26 min Charges:  OT General Charges $OT Visit: 1 Visit OT Evaluation $OT Eval Low Complexity: 1 Low OT Treatments $Self Care/Home Management : 8-22 mins  Nestor Lewandowsky, OTR/L Acute Rehabilitation Services Pager: [phone] Office: [phone]  Malka So 03/17/2020, 3:01 PM

## 2020-03-17 NOTE — Progress Notes (Signed)
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 Ezetimibe Anaphylaxis and Swelling    Tongue and throat swell  . Atorvastatin Other (See Comments) and Rash    Malaise & muscle weakness  . Crestor [Rosuvastatin] Rash and Other (See Comments)    Malaise & muscle weakness  . Lisinopril Rash  . Pravastatin Sodium Other (See Comments)    "Pravastatin 40 mg qday and 40 mg q M/W/F caused muscle aches"   . Promethazine Rash    Patient Measurements: Height: 6\' 3"  (190.5 cm) Weight: 65.3 kg (143 lb 15.4 oz) IBW/kg (Calculated) : 84.5 Heparin Dosing Weight:   Vital Signs: Temp: 97.9 F (36.6 C) (04/23 1340) Temp Source: Oral (04/23 1340) BP: 91/65 (04/23 1340) Pulse Rate: 60 (04/23 1340)  Labs: Recent Labs    03/15/20 0738 03/15/20 0738 03/15/20 1155 03/15/20 1340 03/16/20 0155  HGB 15.1   < >  --  13.9 13.2  HCT 46.4  --   --  41.0 40.2  PLT PLATELET CLUMPS NOTED ON SMEAR, UNABLE TO ESTIMATE  --  117*  --  115*  CREATININE 0.72  --   --  0.50* 0.94   < > = values in this interval not displayed.    Estimated Creatinine Clearance: 70.4 mL/min (by C-G formula based on SCr of 0.94 mg/dL).   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  He was started on heparin drip by [facility] on 4/23 at [place] was consulted on 4/23 to transition patient to low dose xarelto 2.5 mg bid for PAD (per [facility] recom.)    Plan:  - d/c heparin drip - start xarelto 2.5 mg PO bid - monitor for s/sx bleeding  Kirbie Stodghill P 03/17/2020,3:49 PM

## 2020-03-17 NOTE — TOC Benefit Eligibility Note (Signed)
Transition of Care [facility]) Benefit Eligibility Note    Patient Details  Name: [name] [name] MRN: [id number] Date of Birth: 1950-12-05   Medication/Dose: ELIQUIS  5 MG BID -CO-PAY- $ 4.00    and   XARELTO 20 MG DAILY- CO-PAY- $4.00  Covered?: Yes  Tier: 3 Drug  Prescription Coverage Preferred Pharmacy: [facility]  [place] with Person/Company/Phone Number:: COURTNEY   @  [employer] # [phone]  Co-Pay: $ 4.00  Prior Approval: No  Deductible: Unmet(OUT-OF-POCKET-UNMET)  Additional Notes: SECONDARY INS: MEDICAID [place] ACCESS EFF-DATE 10-25-2017 CO-PAY- $4.00 FOR  EACH PRESCRIPTION    Memory Argue Phone Number: 03/17/2020, 10:59 AM

## 2020-03-17 NOTE — Progress Notes (Addendum)
Progress Note    03/17/2020 7:13 AM 1 Day Post-Op  Subjective:  "I feel the best I've felt in years"  Afebrile HR 40's-60's SB/NSR 0000000 systolic [id number] RA  Vitals:   03/16/20 2338 03/17/20 0350  BP: 105/66 (!) 117/59  Pulse: (!) 57 76  Resp: 12 10  Temp: 97.8 F (36.6 C) 97.8 F (36.6 C)  SpO2: 99% 98%    Physical Exam: Cardiac:  regular Lungs:  Non labored Incisions:  Left groin clean and dry Extremities:  Monophasic left PT doppler signal; motor and sensory are intact; left calf is soft without tenderness   CBC    Component Value Date/Time   WBC 7.3 03/16/2020 0155   RBC 4.39 03/16/2020 0155   HGB 13.2 03/16/2020 0155   HGB 12.5 (L) 07/28/2009 1533   HCT 40.2 03/16/2020 0155   HCT 37.1 (L) 07/28/2009 1533   PLT 115 (L) 03/16/2020 0155   PLT 145 07/28/2009 1533   MCV 91.6 03/16/2020 0155   MCV 93.3 07/28/2009 1533   MCH 30.1 03/16/2020 0155   MCHC 32.8 03/16/2020 0155   RDW 13.8 03/16/2020 0155   RDW 14.7 (H) 07/28/2009 1533   LYMPHSABS 1.8 03/15/2020 0738   LYMPHSABS 2.4 07/28/2009 1533   MONOABS 0.6 03/15/2020 0738   MONOABS 0.4 07/28/2009 1533   EOSABS 0.1 03/15/2020 0738   EOSABS 0.2 07/28/2009 1533   BASOSABS 0.1 03/15/2020 0738   BASOSABS 0.1 07/28/2009 1533    BMET    Component Value Date/Time   NA 133 (L) 03/16/2020 0155   K 4.7 03/16/2020 0155   CL 101 03/16/2020 0155   CO2 23 03/16/2020 0155   GLUCOSE 270 (H) 03/16/2020 0155   BUN 17 03/16/2020 0155   CREATININE 0.94 03/16/2020 0155   CALCIUM 9.3 03/16/2020 0155   GFRNONAA >60 03/16/2020 0155   GFRAA >60 03/16/2020 0155    INR    Component Value Date/Time   INR 1.09 11/14/2017 0330     Intake/Output Summary (Last 24 hours) at 03/17/2020 0713 Last data filed at 03/17/2020 0500 Gross per 24 hour  Intake 2040 ml  Output 1575 ml  Net 465 ml      Assessment:  69 y.o. male is s/p:  Right groin exploration with excision of false aneurysm.  Thrombectomy of left limb of  the aortofemoral graft and profundus femoris artery.  Interposition graft of 8 mm Dacron from the old limb of the graft to into into the deep femoral artery. 2 Days Post-Op and Left groin exploration and thrombectomy of left limb of aortofemoral bypass and revision of distal anastomosis, into into profundus femoris artery  1 Day Post-Op  Plan: -pt doing well this morning with monophasic PT doppler signal and motor and sensory intact.  Left calf is soft and non tender.  -pt had some oozing from groin yesterday post op and has not ambulated.  -will start low dose heparin this am-if he tolerates without bleeding, will start Xarelto later today and plan on dc tomorrow if no issues F/u with Dr. Donnetta Hutching in 2-3 weeks.  -DVT prophylaxis:  Sq heparin   Leontine Locket, PA-C [facility] [phone] 03/17/2020 7:13 AM  I agree with the above.  I have seen and evaluated the patient.  He does not have any significant hematoma in the left leg which is now pain-free.  We discussed low-dose heparin therapy today with transition to PAD dosing of Xarelto tonight.  If he tolerates this without any significant change  in the hematoma in his left groin, we will plan for discharge tomorrow  [name]

## 2020-03-18 DIAGNOSIS — T82868A Thrombosis of vascular prosthetic devices, implants and grafts, initial encounter: Secondary | ICD-10-CM | POA: Diagnosis not present

## 2020-03-18 DIAGNOSIS — I998 Other disorder of circulatory system: Secondary | ICD-10-CM | POA: Diagnosis not present

## 2020-03-18 LAB — CBC
HCT: 31.6 % — ABNORMAL LOW (ref 39.0–52.0)
Hemoglobin: 10.1 g/dL — ABNORMAL LOW (ref 13.0–17.0)
MCH: 30.1 pg (ref 26.0–34.0)
MCHC: 32 g/dL (ref 30.0–36.0)
MCV: 94.3 fL (ref 80.0–100.0)
Platelets: 122 10*3/uL — ABNORMAL LOW (ref 150–400)
RBC: 3.35 MIL/uL — ABNORMAL LOW (ref 4.22–5.81)
RDW: 14 % (ref 11.5–15.5)
WBC: 5.7 10*3/uL (ref 4.0–10.5)
nRBC: 0 % (ref 0.0–0.2)

## 2020-03-18 LAB — GLUCOSE, CAPILLARY
Glucose-Capillary: 150 mg/dL — ABNORMAL HIGH (ref 70–99)
Glucose-Capillary: 183 mg/dL — ABNORMAL HIGH (ref 70–99)

## 2020-03-18 LAB — BASIC METABOLIC PANEL
Anion gap: 7 (ref 5–15)
BUN: 12 mg/dL (ref 8–23)
CO2: 28 mmol/L (ref 22–32)
Calcium: 9.1 mg/dL (ref 8.9–10.3)
Chloride: 104 mmol/L (ref 98–111)
Creatinine, Ser: 0.77 mg/dL (ref 0.61–1.24)
GFR calc Af Amer: >60 mL/min (ref >60)
GFR calc non Af Amer: >60 mL/min (ref >60)
Glucose, Bld: 164 mg/dL — ABNORMAL HIGH (ref 70–99)
Potassium: 4.5 mmol/L (ref 3.5–5.1)
Sodium: 139 mmol/L (ref 135–145)

## 2020-03-18 MED ORDER — RIVAROXABAN 2.5 MG PO TABS: 2.5000 mg | ORAL_TABLET | Freq: Two times a day (BID) | ORAL | 3 refills | Status: DC

## 2020-03-18 MED ORDER — ASPIRIN EC 81 MG PO TBEC: 81.0000 mg | DELAYED_RELEASE_TABLET | Freq: Every day | ORAL | Status: DC

## 2020-03-18 MED ORDER — OXYCODONE-ACETAMINOPHEN 5-325 MG PO TABS: 1.0000 | ORAL_TABLET | Freq: Four times a day (QID) | ORAL | 0 refills | Status: DC | PRN

## 2020-03-18 NOTE — Progress Notes (Signed)
D/c tele and IVs. Went over AVS with pt and all questions were answered. Packed pt's belongings with AVS and eye drop med.    Lavenia Atlas, RN

## 2020-03-18 NOTE — Progress Notes (Addendum)
  Progress Note    03/18/2020 8:23 AM 2 Days Post-Op  Subjective:  Feels good.  No issues with walking.  Wants to go home.   Tm 99.7 now afebrile HR 50's-70's NSR 0000000 systolic 0000000 RA  Vitals:   03/18/20 0019 03/18/20 0312  BP: 111/70 121/84  Pulse: 68   Resp: 12 18  Temp: 99.7 F (37.6 C) 99.6 F (37.6 C)  SpO2: 100% 100%    Physical Exam: Cardiac:  regular Lungs:  Non labored Incisions:  Left groin is clean; small hematoma relatively unchanged Extremities:  Good doppler signal left PT signal   CBC    Component Value Date/Time   WBC 5.7 03/18/2020 0219   RBC 3.35 (L) 03/18/2020 0219   HGB 10.1 (L) 03/18/2020 0219   HGB 12.5 (L) 07/28/2009 1533   HCT 31.6 (L) 03/18/2020 0219   HCT 37.1 (L) 07/28/2009 1533   PLT 122 (L) 03/18/2020 0219   PLT 145 07/28/2009 1533   MCV 94.3 03/18/2020 0219   MCV 93.3 07/28/2009 1533   MCH 30.1 03/18/2020 0219   MCHC 32.0 03/18/2020 0219   RDW 14.0 03/18/2020 0219   RDW 14.7 (H) 07/28/2009 1533   LYMPHSABS 1.8 03/15/2020 0738   LYMPHSABS 2.4 07/28/2009 1533   MONOABS 0.6 03/15/2020 0738   MONOABS 0.4 07/28/2009 1533   EOSABS 0.1 03/15/2020 0738   EOSABS 0.2 07/28/2009 1533   BASOSABS 0.1 03/15/2020 0738   BASOSABS 0.1 07/28/2009 1533    BMET    Component Value Date/Time   NA 139 03/18/2020 0219   K 4.5 03/18/2020 0219   CL 104 03/18/2020 0219   CO2 28 03/18/2020 0219   GLUCOSE 164 (H) 03/18/2020 0219   BUN 12 03/18/2020 0219   CREATININE 0.77 03/18/2020 0219   CALCIUM 9.1 03/18/2020 0219   GFRNONAA >60 03/18/2020 0219   GFRAA >60 03/18/2020 0219    INR    Component Value Date/Time   INR 1.09 11/14/2017 0330     Intake/Output Summary (Last 24 hours) at 03/18/2020 0823 Last data filed at 03/18/2020 0019 Gross per 24 hour  Intake 1080 ml  Output --  Net 1080 ml     Assessment:  69 y.o. male is s/p:  Right groin exploration with excision of false aneurysm. Thrombectomy of left limb of the  aortofemoral graft and profundus femoris artery. Interposition graft of 8 mm Dacron from the old limb of the graft to into into the deep femoral artery  2 Days Post-Op  Plan: -pt with good doppler signal left PT signal; motor and sensory remain intact and left calf is soft and non tender. -left groin remains stable since starting low dose Xarelto last evening.  Will d/c on xarelto and asa.  F/u with Dr. Donnetta Hutching in 2-3 weeks. -dc home.    Leontine Locket, PA-C [facility] [phone] 03/18/2020 8:23 AM  I agree with the above.  Have seen and evaluated patient.  He has brisk left posterior tibial Doppler signal.  There is no significant left groin hematoma.  He is ready to go home.  He did well with ambulation yesterday.  We discussed going home on PAD dosing of Xarelto in addition to aspirin.  We will schedule follow-up with Dr. Donnetta Hutching with then a few weeks.  [name]

## 2020-03-18 NOTE — Discharge Summary (Signed)
Discharge Summary     [name] [name] Feb 21, 1951 69 y.o. male  [id number]  Admission Date: 03/15/2020  Discharge Date: 03/18/2020  Physician: Rosetta Posner, MD  Admission Diagnosis: Critical lower limb ischemia [I99.8]  HPI:   This is a 69 y.o. male who presents for evaluation of claudication and rest pain since Saturday.  He states his left leg feels the same as his right did when he had an occlusion years ago.               He States on Saturday he was pressure washing and his leg felt numb, he states that over the next few days he had increased pain in the left LE.  He denise new ulcers.               He is status post aortobifemoral bypass for aortic occlusion. Subsequently underwent left femoral to popliteal bypass for extensive gangrene over the dorsum of his left foot.  He has on one occasion had thrombosis of his right limb of his aortofemoral bypass and had thrombectomy of this.  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   Hospital Course:  The patient was admitted to the hospital and taken to the operating room on 03/15/2020 and underwent: Right groin exploration with excision of false aneurysm.  Thrombectomy of left limb of the aortofemoral graft and profundus femoris artery.  Interposition graft of 8 mm Dacron from the old limb of the graft to into into the deep femoral artery.    The pt tolerated the procedure well and was transported to the PACU in good condition.  Comfortable in PACU.  Left groin without hematoma.  2+ left femoral pulse.  Patient does not have audible flow in his foot.  Suspect this is related to his foot being cool.  He does have normal motor function and slight diminished sensory function as preop.  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  On POD 1, On physical exam today, the patient was found to have no left femoral pulse.  He does  report that his foot feels better.  I do not have Doppler flow in his foot.  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  Discussed at length with the patient that we will need urgent return to the operating room this morning.  He has not had breakfast.  Unclear as to the cause of his recurrent thrombosis.  Explained that this could be either inflow or outflow or even potentially technical issue regarding his revision yesterday.  Will be prepared for potential femorofemoral however had good inflow yesterday.  Also will be prepared for femoral to popliteal bypass.  This would be difficult to do due to his known severe tibial disease and no vein for bypass.  He was taken to the operating room on 03/16/2020 and underwent:  Left groin exploration and thrombectomy of left limb of aortofemoral bypass and revision of distal anastomosis, into into profundus femoris artery by Dr. Donnetta Hutching.  Comfortable in his room.  Some bleeding from his groin incision.  No evidence of hematoma.  2+ left femoral pulse  I did not reverse his heparin due to his thrombosis overnight.  Will not have him walk actively this evening and begin mobilization tomorrow.   On 03/17/2020, he was started on heparin gtt.  He tolerated that well and was started on PAD dosing of  Xarelto that evening.  He ambulated without difficulty.     On 03/18/2020, he had a good doppler signal left PT with motor and sensory in tact.  Left calf was soft and non tender.  His groin was stable with starting anticoagulation and he was discharged home and will f/u with Dr. Donnetta Hutching in 2-3 weeks.    The remainder of the hospital course consisted of increasing mobilization and increasing intake of solids without difficulty.  CBC    Component Value Date/Time   WBC 5.7 03/18/2020 0219   RBC 3.35 (L) 03/18/2020 0219   HGB 10.1 (L) 03/18/2020 0219   HGB 12.5 (L) 07/28/2009 1533   HCT 31.6 (L) 03/18/2020 0219   HCT 37.1 (L) 07/28/2009  1533   PLT 122 (L) 03/18/2020 0219   PLT 145 07/28/2009 1533   MCV 94.3 03/18/2020 0219   MCV 93.3 07/28/2009 1533   MCH 30.1 03/18/2020 0219   MCHC 32.0 03/18/2020 0219   RDW 14.0 03/18/2020 0219   RDW 14.7 (H) 07/28/2009 1533   LYMPHSABS 1.8 03/15/2020 0738   LYMPHSABS 2.4 07/28/2009 1533   MONOABS 0.6 03/15/2020 0738   MONOABS 0.4 07/28/2009 1533   EOSABS 0.1 03/15/2020 0738   EOSABS 0.2 07/28/2009 1533   BASOSABS 0.1 03/15/2020 0738   BASOSABS 0.1 07/28/2009 1533    BMET    Component Value Date/Time   NA 139 03/18/2020 0219   K 4.5 03/18/2020 0219   CL 104 03/18/2020 0219   CO2 28 03/18/2020 0219   GLUCOSE 164 (H) 03/18/2020 0219   BUN 12 03/18/2020 0219   CREATININE 0.77 03/18/2020 0219   CALCIUM 9.1 03/18/2020 0219   GFRNONAA >60 03/18/2020 0219   GFRAA >60 03/18/2020 0219       Discharge Diagnosis:  Critical lower limb ischemia [I99.8]  Secondary Diagnosis: Patient Active Problem List   Diagnosis Date Noted  . Critical lower limb ischemia 03/15/2020  . CAD (coronary artery disease) 12/02/2018  . Essential (primary) hypertension 12/02/2018  . Opiate dependence ([place]) 06/22/2018  . PAD (peripheral artery disease) ([place]) 11/13/2017  . Mood disorder ([place]) 01/28/2016  . Depression 01/28/2016  . Acute blood loss anemia 01/06/2016  . Polyneuropathy 01/06/2016  . Hypomagnesemia 12/16/2015  . Demand ischemia on 1/20 from acute blood loss anemia ([place]) 12/16/2015  . Abdominal wall fluid collections   . Acute encephalopathy 12/12/2015  . Seizures ([place]) 12/12/2015  . Acute kidney injury ([place]) 12/12/2015  . Controlled diabetes mellitus with circulatory complication, without long-term current use of insulin ([place]) 12/12/2015  . Anemia of chronic disease 12/12/2015  . Sepsis ([place]) 12/12/2015  . Leukocytosis 12/12/2015  . Thrombocytopenia ([place]) 12/12/2015  . Palliative care encounter   . Acute respiratory failure with hypoxemia ([place]) 12/06/2015  . DVT of axillary  vein, acute right ([place])   . Small bowel obstruction & perforation s/p OR exploration & repair 12/01/2015 12/02/2015  . Purulent peritonitis ([place]) 12/02/2015  . Protein-calorie malnutrition, severe ([place]) 07/30/2015   Past Medical History:  Diagnosis Date  . Arthritis   . Diabetes mellitus without complication ([place])   . GERD (gastroesophageal reflux disease)   . Hypertension   . PAD (peripheral artery disease) ([facility])      Allergies as of 03/18/2020      Reactions   Ezetimibe Anaphylaxis, Swelling   Tongue and throat swell   Atorvastatin Other (See Comments), Rash   Malaise & muscle weakness   Crestor [rosuvastatin] Rash, Other (See Comments)   Malaise & muscle weakness  Lisinopril Rash   Pravastatin Sodium Other (See Comments)   "Pravastatin 40 mg qday and 40 mg q M/W/F caused muscle aches"   Promethazine Rash      Medication List    TAKE these medications   acetaminophen 500 MG tablet Commonly known as: TYLENOL Take 500 mg by mouth every 6 (six) hours as needed (for headaches).   aspirin EC 81 MG tablet Take 1 tablet (81 mg total) by mouth daily.   dorzolamide-timolol 22.3-6.8 MG/ML ophthalmic solution Commonly known as: COSOPT 1 drop 2 (two) times daily.   gabapentin 300 MG capsule Commonly known as: NEURONTIN Take 300 mg by mouth 3 (three) times daily as needed (pain).   methocarbamol 500 MG tablet Commonly known as: ROBAXIN Take 500 mg by mouth 2 (two) times daily as needed for muscle spasms.   oxyCODONE-acetaminophen 5-325 MG tablet Commonly known as: Percocet Take 1 tablet by mouth every 6 (six) hours as needed.   rivaroxaban 2.5 MG Tabs tablet Commonly known as: XARELTO Take 1 tablet (2.5 mg total) by mouth 2 (two) times daily.   zolpidem 10 MG tablet Commonly known as: AMBIEN Take 10 mg by mouth at bedtime as needed for sleep.       Discharge Instructions: [facility] Discharge instructions Lower Extremity Bypass  Surgery  Please refer to the following instruction for your post-procedure care. Your surgeon or physician assistant will discuss any changes with you.  Activity  You are encouraged to walk as much as you can. You can slowly return to normal activities during the month after your surgery. Avoid strenuous activity and heavy lifting until your doctor tells you it's OK. Avoid activities such as vacuuming or swinging a golf club. Do not drive until your doctor give the OK and you are no longer taking prescription pain medications. It is also normal to have difficulty with sleep habits, eating and bowel movement after surgery. These will go away with time.  Bathing/Showering  You may shower after you go home. Do not soak in a bathtub, hot tub, or swim until the incision heals completely.  Incision Care  Clean your incision with mild soap and water. Shower every day. Pat the area dry with a clean towel. You do not need a bandage unless otherwise instructed. Do not apply any ointments or creams to your incision. If you have open wounds you will be instructed how to care for them or a visiting nurse may be arranged for you. If you have staples or sutures along your incision they will be removed at your post-op appointment. You may have skin glue on your incision. Do not peel it off. It will come off on its own in about one week.  Wash the groin wound with soap and water daily and pat dry. (No tub bath-only shower)  Then put a dry gauze or washcloth in the groin to keep this area dry to help prevent wound infection.  Do this daily and as needed.  Do not use Vaseline or neosporin on your incisions.  Only use soap and water on your incisions and then protect and keep dry.  Diet  Resume your normal diet. There are no special food restrictions following this procedure. A low fat/ low cholesterol diet is recommended for all patients with vascular disease. In order to heal from your surgery, it is CRITICAL to  get adequate nutrition. Your body requires vitamins, minerals, and protein. Vegetables are the best source of vitamins and  minerals. Vegetables also provide the perfect balance of protein. Processed food has little nutritional value, so try to avoid this.  Medications  Resume taking all your medications unless your doctor or Physician Assistant tells you not to. If your incision is causing pain, you may take over-the-counter pain relievers such as acetaminophen (Tylenol). If you were prescribed a stronger pain medication, please aware these medication can cause nausea and constipation. Prevent nausea by taking the medication with a snack or meal. Avoid constipation by drinking plenty of fluids and eating foods with high amount of fiber, such as fruits, vegetables, and grains. Take Colace 100 mg (an over-the-counter stool softener) twice a day as needed for constipation.  Do not take Tylenol if you are taking prescription pain medications.  Follow Up  Our office will schedule a follow up appointment 2-3 weeks following discharge.  Please call us immediately for any of the following conditions  .Severe or worsening pain in your legs or feet while at rest or while walking .Increase pain, redness, warmth, or drainage (pus) from your incision site(s) . Fever of 101 degree or higher . The swelling in your leg with the bypass suddenly worsens and becomes more painful than when you were in the hospital . If you have been instructed to feel your graft pulse then you should do so every day. If you can no longer feel this pulse, call the office immediately. Not all patients are given this instruction. .  Leg swelling is common after leg bypass surgery.  The swelling should improve over a few months following surgery. To improve the swelling, you may elevate your legs above the level of your heart while you are sitting or resting. Your surgeon or physician assistant may ask you to apply an ACE wrap or wear  compression (TED) stockings to help to reduce swelling.  Reduce your risk of vascular disease  Stop smoking. If you would like help call [facility] at 1-800-QUIT-NOW 442-779-5838) or [place] at (854) 497-8398.  . Manage your cholesterol . Maintain a desired weight . Control your diabetes weight . Control your diabetes . Keep your blood pressure down .  If you have any questions, please call the office at [phone]   Prescriptions given: 1.  Roxicet #20 No Refill 2.  Aspirin 81mg  daily 3.  Xarelto 2.5mg  bid #60 two refills  Disposition: home  Patient's condition: is Good  Follow up: 1. Dr. Donnetta Hutching in 2-3 weeks   Leontine Locket, PA-C [facility] [phone] 03/18/2020  8:57 AM  - For VQI Registry use ---   Post-op:  Wound infection: No  Graft infection: No  Transfusion: No    If yes, n/a units given New Arrhythmia: No Ipsilateral amputation: No, [ ]  Minor, [ ]  BKA, [ ]  AKA Discharge patency: [ ]  Primary, [ ]  Primary assisted, [x]  Secondary, [ ]  Occluded Patency judged by: [x ] Dopper only, [ ]  Palpable graft pulse, []  Palpable distal pulse, [ ]  ABI inc. > 0.15, [ ]  Duplex Discharge ABI: R not done, L not done D/C Ambulatory Status: Ambulatory  Complications: MI: No, [ ]  Troponin only, [ ]  EKG or Clinical CHF: No Resp failure:No, [ ]  Pneumonia, [ ]  Ventilator Chg in renal function: No, [ ]  Inc. Cr > 0.5, [ ]  Temp. Dialysis,  [ ]  Permanent dialysis Stroke: No, [ ]  Minor, [ ]  Major Return to OR: No  Reason for return to OR: [ ]  Bleeding, [ ]  Infection, [ ]  Thrombosis, [ ]   Revision  Discharge medications: Statin use:  yes ASA use:  yes Plavix use:  no Beta blocker use: no CCB use:  No ACEI use:   no ARB use:  no Coumadin use: no Xarelto: yes

## 2020-03-20 ENCOUNTER — Inpatient Hospital Stay: Admit: 2020-03-20 | Payer: Medicare Other | Admitting: Vascular Surgery

## 2020-03-20 SURGERY — DEBRIDEMENT, INGUINAL REGION
Anesthesia: Monitor Anesthesia Care | Laterality: Left

## 2020-03-27 ENCOUNTER — Ambulatory Visit (INDEPENDENT_AMBULATORY_CARE_PROVIDER_SITE_OTHER): Payer: Self-pay | Admitting: Physician Assistant

## 2020-03-27 ENCOUNTER — Other Ambulatory Visit: Payer: Self-pay

## 2020-03-27 VITALS — BP 128/85 | HR 70 | Temp 97.0°F | Resp 18 | Ht 75.0 in | Wt 153.0 lb

## 2020-03-27 DIAGNOSIS — I739 Peripheral vascular disease, unspecified: Secondary | ICD-10-CM

## 2020-03-27 MED ORDER — OXYCODONE-ACETAMINOPHEN 5-325 MG PO TABS: 1.0000 | ORAL_TABLET | Freq: Four times a day (QID) | ORAL | 0 refills | Status: DC | PRN

## 2020-03-27 NOTE — Progress Notes (Signed)
    Postoperative Visit   History of Present Illness   [name] [name] is a 69 y.o. year old male who presents for postoperative follow-up.  He is status post thrombectomy of left limb of aortobifemoral bypass graft and interposition graft from ABF limb to profunda by Dr. Donnetta Hutching on 03/15/2020.  Postop day #1 patient returned to the OR emergently due to recurrent thrombosis of left limb of aortobifemoral bypass.  He presents today with ongoing postoperative pain and some drainage from left groin incision.  He states his left leg still feels much better compared to before surgery.  He is ambulatory with a walker.  He denies any fevers, chills, nausea/vomiting.   For VQI Use Only   PRE-ADM LIVING: Home  AMB STATUS: Ambulatory with Assistance   Physical Examination   Vitals:   03/27/20 1441  BP: 128/85  Pulse: 70  Resp: 18  Temp: (!) 97 F (36.1 C)  TempSrc: Temporal  SpO2: 100%  Weight: 153 lb (69.4 kg)  Height: 6\' 3"  (1.905 m)    LLE: Incision healing well with minimal proximal pole dehiscence superficially; no purulence or sign of infection; some fullness to palpation; palpable left femoral pulse; feet are symmetrically warm to touch   Medical Decision Making   [name] [name] is a 69 y.o. year old male who presents s/p thrombectomy of left limb of aortobifemoral bypass graft and interposition graft from left limb to profunda.  Return to the OR on postoperative day #1 for thrombectomy of left limb  . Left groin incision with some fullness however no obvious sign of infection; incision appears to be healing well . There is a 2+ palpable left femoral pulse and patient states left foot continues to feel much better compared to before initial surgery . Encouraged daily cleansing of left groin incision and placing a 4 x 4 gauze to wick moisture from incision . #20 5-325mg  Percocet prescribed . Patient will follow up with Dr. Donnetta Hutching next week as scheduled   Dagoberto Ligas  PA-C [facility] [place] Office: [phone]  Clinic MD: Early

## 2020-04-04 ENCOUNTER — Encounter: Payer: Self-pay | Admitting: Vascular Surgery

## 2020-04-04 ENCOUNTER — Other Ambulatory Visit: Payer: Self-pay

## 2020-04-04 ENCOUNTER — Ambulatory Visit (INDEPENDENT_AMBULATORY_CARE_PROVIDER_SITE_OTHER): Payer: Self-pay | Admitting: Vascular Surgery

## 2020-04-04 VITALS — BP 131/85 | HR 60 | Temp 97.7°F | Resp 20 | Ht 75.0 in | Wt 150.0 lb

## 2020-04-04 DIAGNOSIS — I739 Peripheral vascular disease, unspecified: Secondary | ICD-10-CM

## 2020-04-04 NOTE — Progress Notes (Signed)
   Patient name: [name] [name] MRN: [id number] DOB: Feb 22, 1951 Sex: male  REASON FOR VISIT: [place] up recent thrombectomy and revision of left limb of aortofemoral bypass  HPI: [name] [name] is a 69 y.o. male today for follow-up.  He had presented with occlusion of the left limb of his aortofemoral bypass.  He had severe ischemia.  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  He underwent thrombectomy and revision of this and had recurrent thrombosis the night following this procedure.  He was taken back to the operating room and underwent a redo thrombectomy and no technical issues were seen.  He is here today for follow-up.  He reports sensation of heaviness in both feet.  He does have some soreness in his groin  Current Outpatient Medications  Medication Sig Dispense Refill  . acetaminophen (TYLENOL) 500 MG tablet Take 500 mg by mouth every 6 (six) hours as needed (for headaches).     . dorzolamide-timolol (COSOPT) 22.3-6.8 MG/ML ophthalmic solution 1 drop 2 (two) times daily.    Marland Kitchen gabapentin (NEURONTIN) 300 MG capsule Take 300 mg by mouth 3 (three) times daily as needed (pain).     . methocarbamol (ROBAXIN) 500 MG tablet Take 500 mg by mouth 2 (two) times daily as needed for muscle spasms.     Marland Kitchen oxyCODONE-acetaminophen (PERCOCET) 5-325 MG tablet Take 1 tablet by mouth every 6 (six) hours as needed. 25 tablet 0  . rivaroxaban (XARELTO) 2.5 MG TABS tablet Take 1 tablet (2.5 mg total) by mouth 2 (two) times daily. 60 tablet 3  . zolpidem (AMBIEN) 10 MG tablet Take 10 mg by mouth at bedtime as needed for sleep.     Marland Kitchen aspirin EC 81 MG tablet Take 1 tablet (81 mg total) by mouth daily. (Patient not taking: Reported on 03/27/2020)     No current facility-administered medications for this visit.     PHYSICAL EXAM: Vitals:   04/04/20 0816  BP: 131/85  Pulse: 60  Resp: 20  Temp: 97.7 F (36.5 C)  SpO2: 98%  Weight: 150 lb (68  kg)  Height: 6\' 3"  (1.905 m)    GENERAL: The patient is a well-nourished male, in no acute distress. The vital signs are documented above. 2+ femoral pulses in his groins bilaterally.  He has known infrainguinal disease.  Feet are warm bilaterally with neuro intact  MEDICAL ISSUES: Stable status post thrombectomy revision left limb of aortofemoral bypass.  We will see him again in 3 months with ankle arm indices  He does have issues with chronic pain and has been in chronic pain management in the past.  He is asking for narcotic refill.  I explained that I am not comfortable with this since he really is past the surgical incisional type pain.  He will discuss this with primary care and has a appointment tomorrow with Karie Chimera, MD [facility] Tel [phone] Pager [phone]

## 2020-04-05 ENCOUNTER — Other Ambulatory Visit: Payer: Self-pay | Admitting: *Deleted

## 2020-04-05 DIAGNOSIS — I739 Peripheral vascular disease, unspecified: Secondary | ICD-10-CM

## 2020-08-08 NOTE — Progress Notes (Deleted)
Cardiology Office Note:    Date:  08/08/2020   ID:  [name] [name], DOB 01-May-1951, MRN [id number]  PCP:  Kristopher Glee., MD  [facility] Cardiologist:  Sherren Mocha, MD *** [facility] Electrophysiologist:  None   Referring MD: Kristopher Glee., MD   Chief Complaint:  No chief complaint on file.    Patient Profile:    [name] [name] is a 69 y.o. male with:   Coronary artery disease   S/p NSTEMI tx with DES to mRCA (CTO of OM1 tx med)  Cath in 2015: patent RCA stent, occluded OM1, stable anatomy  Ischemic CM  EF 35-40  Peripheral arterial disease   S/p Ao-Bifem bypass  S/p L fem to pop bypass  S/p thrombectomy of R limb of Ao-Bifem bypass in 01/2017   S/p thrombectomy of L limb of Ao-Bifem bypass in 02/2020   Hypertension   Hyperlipidemia   Diabetes mellitus   Seizure d/o  Admx w perforated small bowel in 2/17  Prior CV studies: - LHC (11/2013): Mid LAD 30%, OM1 occluded (L-L collats), prox RCA stent ok, EF 55-65%.  - Echo (07/2013): EF 55-60%, no RWMA. - Nuclear (11/2013): Inf and lat infarct with mild peri-infarct ischemia, EF 41%; intermediate risk  History of Present Illness:    [name] [name] was last seen in 03/2019 via telemedicine.  ***      Past Medical History:  Diagnosis Date  . Arthritis   . Diabetes mellitus without complication ([place])   . GERD (gastroesophageal reflux disease)   . Hypertension   . PAD (peripheral artery disease) ([place])   . Peptic ulcer     Current Medications: No outpatient medications have been marked as taking for the 08/09/20 encounter (Appointment) with Richardson Dopp T, PA-C.     Allergies:   Ezetimibe, Atorvastatin, Crestor [rosuvastatin], Lisinopril, Pravastatin sodium, and Promethazine   Social History   Tobacco Use  . Smoking status: Current Every Day Smoker    Packs/day: 0.25    Years: 46.00    Pack years: 11.50    Types: Cigarettes  . Smokeless tobacco: Never Used  . Tobacco comment: 12  cigarettes per day.   Vaping Use  . Vaping Use: Never used  Substance Use Topics  . Alcohol use: No    Alcohol/week: 0.0 standard drinks    Comment: a12/17/20214 "last drink was 3-4 yr ago; never had problem w/it"  . Drug use: No    Frequency: 1.0 times per week    Types: Marijuana, Cocaine    Comment: stopped marijuana May 07, 2012, stopped cocaine 1980     Family Hx: The patient's family history includes Aneurysm in his father; Diabetes in his mother; Heart attack in his mother and sister; Heart disease in his brother, father, mother, and sister; Hypertension in his mother.  ROS   EKGs/Labs/Other Test Reviewed:    EKG:  EKG is *** ordered today.  The ekg ordered today demonstrates ***  Recent Labs: 03/15/2020: ALT 19 03/18/2020: BUN 12; Creatinine, Ser 0.77; Hemoglobin 10.1; Platelets 122; Potassium 4.5; Sodium 139   Recent Lipid Panel Lab Results  Component Value Date/Time   CHOL 154 11/24/2015 04:45 AM   TRIG 115 12/25/2015 04:50 AM   HDL 36 (L) 11/24/2015 04:45 AM   CHOLHDL 4.3 11/24/2015 04:45 AM   LDLCALC 96 11/24/2015 04:45 AM    Physical Exam:    VS:  There were no vitals taken for this visit.    Wt Readings from Last  3 Encounters:  04/04/20 150 lb (68 kg)  03/27/20 153 lb (69.4 kg)  03/16/20 143 lb 15.4 oz (65.3 kg)     Physical Exam ***  ASSESSMENT & PLAN:    *** Coronary artery disease involving native coronary artery of native heart without angina pectoris Prior hx of MI treated with DES to the RCA. His OM is known to be chronically occluded.  He has chronic chest pain but has not had any change in his symptoms.  He has been resistant to taking medications for risk reduction or prevention.  He had a prolonged admission in 2017 with perforated bowel.  ASA may not be the best choice for myocardial infarction prevention, but clopidogrel would be a reasonable choice.  I have discussed this with him today but he would like to consider this for now.  He is  intol of statins.    PAOD (peripheral arterial occlusive disease) ([place]) Continue follow up with vascular surgery as planned.  Essential (primary) hypertension He is unable to check his blood pressure.  I will arrange for him to get a cuff.  His blood pressure is typically too high and he is not interested in taking medications.  At his last visit with his PCP, Amlodipine was recommended for his blood pressure.  The patient is not taking this and he tells me that his primary care provider was "quite pleased" with his blood pressure at his last visit.  We did discuss the importance of good blood pressure control to prevent heart attacks and strokes.   Hyperlipidemia, unspecified hyperlipidemia type He is intol of statins.  I did suggest we consider a referral to the [facility] in the future for consideration of a PCSK-9 inhibitor.  He notes that he takes a lot of herbal supplements to help with his cholesterol.    Dispo:  No follow-ups on file.   Medication Adjustments/Labs and Tests Ordered: Current medicines are reviewed at length with the patient today.  Concerns regarding medicines are outlined above.  Tests Ordered: No orders of the defined types were placed in this encounter.  Medication Changes: No orders of the defined types were placed in this encounter.   Signed, Richardson Dopp, PA-C  08/08/2020 8:42 PM    [facility] [address], [place], [place]  [address] Phone: [phone]; Fax: [phone]

## 2020-08-09 ENCOUNTER — Ambulatory Visit: Payer: Medicare Other | Admitting: Physician Assistant

## 2020-09-06 ENCOUNTER — Ambulatory Visit: Payer: Medicare Other | Admitting: Physician Assistant

## 2020-10-30 ENCOUNTER — Ambulatory Visit (INDEPENDENT_AMBULATORY_CARE_PROVIDER_SITE_OTHER): Payer: Medicare Other | Admitting: Physician Assistant

## 2020-10-30 ENCOUNTER — Encounter: Payer: Self-pay | Admitting: Physician Assistant

## 2020-10-30 ENCOUNTER — Other Ambulatory Visit: Payer: Self-pay

## 2020-10-30 ENCOUNTER — Ambulatory Visit: Payer: Medicare Other | Admitting: Physician Assistant

## 2020-10-30 VITALS — BP 132/76 | HR 53 | Ht 75.0 in | Wt 160.0 lb

## 2020-10-30 DIAGNOSIS — I25119 Atherosclerotic heart disease of native coronary artery with unspecified angina pectoris: Secondary | ICD-10-CM

## 2020-10-30 DIAGNOSIS — E782 Mixed hyperlipidemia: Secondary | ICD-10-CM

## 2020-10-30 DIAGNOSIS — I779 Disorder of arteries and arterioles, unspecified: Secondary | ICD-10-CM | POA: Diagnosis not present

## 2020-10-30 DIAGNOSIS — I10 Essential (primary) hypertension: Secondary | ICD-10-CM | POA: Diagnosis not present

## 2020-10-30 NOTE — Progress Notes (Signed)
Cardiology Office Note:    Date:  10/30/2020   ID:  [name] [name], DOB November 14, 1951, MRN [id number]  PCP:  Kristopher Glee., MD  [facility] Cardiologist:  Sherren Mocha, MD   [facility] Electrophysiologist:  None  VVS:  Dr. Donnetta Hutching  Referring MD: Kristopher Glee., MD   Chief Complaint:  Follow-up (CAD)    Patient Profile:    [name] [name] is a 69 y.o. male with:   Coronary artery disease  S/p NSTEMI >> PCI: DES to mRCA (CTO of OM1 tx med)  Cath 1/15: OM1 100 (CTO); pRCA stent ok  Ischemic cardiomyopathy  EF 35-40 >> returned to normal   Peripheral arterial disease   S/p Ao-bifem bypass  S/p thrombecomty of R limb of Ao-bifem bypass and patch angioplasty of CFA and PFA 12/18  S/p excision of R groin pseudoaneurysm, thrombectomy of L limb of Ao-Fem graft and PFA in 02/2020  Hypertension   Hyperlipidemia   Diabetes mellitus   Seizure d/o  Hx of perforated small bowel in 12/2015 - prolonged hospitalization   Prior CV studies:   - LHC (11/2013): Mid LAD 30%, OM1 occluded (L-L collats), prox RCA stent ok, EF 55-65%.  - Echo (07/2013): EF 55-60%, no RWMA. - Nuclear (11/2013): Inf and lat infarct with mild peri-infarct ischemia, EF 41%; intermediate risk  - LHC (05/2013):  LAD prox 30; D1 ost 30, LCx 80, 100 (CTO); RCA mid 95, EF 60   - PCI (05/2013):  3x32 mm Promus DES to mRCA   History of Present Illness:    Mr. Sauseda was last seen via telemedicine in May 2020.  He returns for follow-up.  He is here alone.  He is not sure what medications he is taking.  However, he does note that this PCP now has him on Repatha.  He is not sure of the dose.  He does not take ASA.  We discussed starting Plavix but he does not want to take this.  He has occasional L sided chest pain.  He has had this since his MI.  His symptoms have not changed.  He has not had to take NTG.  He has not had significant changes in his shortness of breath.  He has not had syncope, leg  swelling.  He is fairly sedentary.       Past Medical History:  Diagnosis Date  . Arthritis   . Diabetes mellitus without complication ([place])   . GERD (gastroesophageal reflux disease)   . Hypertension   . PAD (peripheral artery disease) ([place])   . Peptic ulcer     Current Medications: Current Meds  Medication Sig  . acetaminophen (TYLENOL) 500 MG tablet Take 500 mg by mouth every 6 (six) hours as needed (for headaches).   . dorzolamide-timolol (COSOPT) 22.3-6.8 MG/ML ophthalmic solution 1 drop 2 (two) times daily.  Marland Kitchen gabapentin (NEURONTIN) 300 MG capsule Take 300 mg by mouth 3 (three) times daily as needed (pain).   . methocarbamol (ROBAXIN) 500 MG tablet Take 500 mg by mouth 2 (two) times daily as needed for muscle spasms.   Marland Kitchen zolpidem (AMBIEN) 10 MG tablet Take 10 mg by mouth at bedtime as needed for sleep.   . [DISCONTINUED] aspirin EC 81 MG tablet Take 1 tablet (81 mg total) by mouth daily.     Allergies:   Ezetimibe, Atorvastatin, Crestor [rosuvastatin], Lisinopril, Pravastatin sodium, and Promethazine   Social History   Tobacco Use  . Smoking status: Current Every Day Smoker  Packs/day: 0.25    Years: 46.00    Pack years: 11.50    Types: Cigarettes  . Smokeless tobacco: Never Used  . Tobacco comment: 12 cigarettes per day.   Vaping Use  . Vaping Use: Never used  Substance Use Topics  . Alcohol use: No    Alcohol/week: 0.0 standard drinks    Comment: a12/17/20214 "last drink was 3-4 yr ago; never had problem w/it"  . Drug use: No    Frequency: 1.0 times per week    Types: Marijuana, Cocaine    Comment: stopped marijuana May 07, 2012, stopped cocaine 1980     Family Hx: The patient's family history includes Aneurysm in his father; Diabetes in his mother; Heart attack in his mother and sister; Heart disease in his brother, father, mother, and sister; Hypertension in his mother.  ROS   EKGs/Labs/Other Test Reviewed:    EKG:  EKG is   ordered today.  The ekg  ordered today demonstrates sinus brady, HR 53, normal axis, non-specific ST-TW changes, QTc 431   Recent Labs: 03/15/2020: ALT 19 03/18/2020: BUN 12; Creatinine, Ser 0.77; Hemoglobin 10.1; Platelets 122; Potassium 4.5; Sodium 139   Recent Lipid Panel Lab Results  Component Value Date/Time   CHOL 154 11/24/2015 04:45 AM   TRIG 115 12/25/2015 04:50 AM   HDL 36 (L) 11/24/2015 04:45 AM   CHOLHDL 4.3 11/24/2015 04:45 AM   LDLCALC 96 11/24/2015 04:45 AM      Risk Assessment/Calculations:      Physical Exam:    VS:  BP 132/76   Pulse (!) 53   Ht 6\' 3"  (1.905 m)   Wt 160 lb (72.6 kg)   SpO2 98%   BMI 20.00 kg/m     Wt Readings from Last 3 Encounters:  10/30/20 160 lb (72.6 kg)  04/04/20 150 lb (68 kg)  03/27/20 153 lb (69.4 kg)     Constitutional:      Appearance: Healthy appearance. Not in distress.  Neck:     Thyroid: No thyromegaly.     Vascular: No carotid bruit. JVD normal.  Pulmonary:     Effort: Pulmonary effort is normal.     Breath sounds: No wheezing. No rales.  Cardiovascular:     Normal rate. Regular rhythm. Normal S1. Normal S2.     Murmurs: There is no murmur.  Edema:    Peripheral edema absent.  Abdominal:     Palpations: Abdomen is soft.  Skin:    General: Skin is warm and dry.  Neurological:     General: No focal deficit present.     Mental Status: Alert and oriented to person, place and time.     Cranial Nerves: Cranial nerves are intact.       ASSESSMENT & PLAN:    1. Coronary artery disease involving native coronary artery of native heart with angina pectoris ([place]) Prior hx of MI treated with DES to the RCA. His OM is known to be chronically occluded.  He has chronic chest pain but has not had any change in his symptoms.  We discussed the benefit of taking antiplatelet Rx.  I have recommended taking Clopidogrel.  But, he declines. He is now taking Evolocumab.  I have asked him to contact us to let us know what medications he is taking.    2.  PAOD (peripheral arterial occlusive disease) ([place]) Continue f/u with Dr. Donnetta Hutching as directed.   3. Essential (primary) hypertension BP somewhat borderline today.  He would not  be able to tolerate beta-blocker due to bradycardia and has a hx of allergy to ACE inhibitor.  I have asked him to monitor his BP and let us know if it is running high.  We could start him on Amlodipine if needed.   4. Mixed hyperlipidemia Followed by PCP.  Goal LDL < 70.       Dispo:  Return in about 6 months (around 04/30/2021) for Routine Follow Up, w/ Dr. Burt Knack, in person.   Medication Adjustments/Labs and Tests Ordered: Current medicines are reviewed at length with the patient today.  Concerns regarding medicines are outlined above.  Tests Ordered: Orders Placed This Encounter  Procedures  . EKG 12-Lead   Medication Changes: No orders of the defined types were placed in this encounter.   Signed, Richardson Dopp, PA-C  10/30/2020 4:13 PM    [facility] [address], [place], [place]  [address] Phone: [phone]; Fax: [phone]

## 2020-10-30 NOTE — Patient Instructions (Signed)
Medication Instructions:  Your physician recommends that you continue on your current medications as directed. Please refer to the Current Medication list given to you today.  *If you need a refill on your cardiac medications before your next appointment, please call your pharmacy*  Lab Work: None ordered today  Testing/Procedures: None ordered today  Follow-Up: At [facility], you and your health needs are our priority.  As part of our continuing mission to provide you with exceptional heart care, we have created designated Provider Care Teams.  These Care Teams include your primary Cardiologist (physician) and Advanced Practice Providers (APPs -  Physician Assistants and Nurse Practitioners) who all work together to provide you with the care you need, when you need it.  Your next appointment:   6 month(s)  The format for your next appointment:   In Person  Provider:   Sherren Mocha, MD  Other Instructions Call the office at [phone] to let us know what medications you are on

## 2021-10-19 ENCOUNTER — Emergency Department (HOSPITAL_COMMUNITY): Payer: Medicare Other

## 2021-10-19 ENCOUNTER — Emergency Department (HOSPITAL_COMMUNITY)
Admission: EM | Admit: 2021-10-19 | Discharge: 2021-10-19 | Disposition: A | Discharge status: Home / Self Care | Payer: Medicare Other | Attending: Emergency Medicine | Admitting: Emergency Medicine

## 2021-10-19 ENCOUNTER — Other Ambulatory Visit: Payer: Self-pay

## 2021-10-19 ENCOUNTER — Encounter: Payer: Self-pay | Admitting: Physician Assistant

## 2021-10-19 ENCOUNTER — Encounter (HOSPITAL_COMMUNITY): Payer: Self-pay

## 2021-10-19 ENCOUNTER — Ambulatory Visit (HOSPITAL_COMMUNITY)
Admission: EM | Admit: 2021-10-19 | Discharge: 2021-10-19 | Disposition: A | Discharge status: Other Acute Inpatient Hospital | Payer: Medicare Other | Attending: Emergency Medicine | Admitting: Emergency Medicine

## 2021-10-19 DIAGNOSIS — R079 Chest pain, unspecified: Secondary | ICD-10-CM | POA: Insufficient documentation

## 2021-10-19 DIAGNOSIS — R1084 Generalized abdominal pain: Secondary | ICD-10-CM | POA: Insufficient documentation

## 2021-10-19 DIAGNOSIS — I2510 Atherosclerotic heart disease of native coronary artery without angina pectoris: Secondary | ICD-10-CM | POA: Insufficient documentation

## 2021-10-19 DIAGNOSIS — I10 Essential (primary) hypertension: Secondary | ICD-10-CM | POA: Insufficient documentation

## 2021-10-19 DIAGNOSIS — M25512 Pain in left shoulder: Secondary | ICD-10-CM | POA: Diagnosis not present

## 2021-10-19 DIAGNOSIS — F17210 Nicotine dependence, cigarettes, uncomplicated: Secondary | ICD-10-CM | POA: Diagnosis not present

## 2021-10-19 DIAGNOSIS — M542 Cervicalgia: Secondary | ICD-10-CM | POA: Diagnosis not present

## 2021-10-19 LAB — COMPREHENSIVE METABOLIC PANEL
ALT: 13 U/L (ref 0–44)
AST: 22 U/L (ref 15–41)
Albumin: 3.9 g/dL (ref 3.5–5.0)
Alkaline Phosphatase: 43 U/L (ref 38–126)
Anion gap: 11 (ref 5–15)
BUN: 19 mg/dL (ref 8–23)
CO2: 20 mmol/L — ABNORMAL LOW (ref 22–32)
Calcium: 10 mg/dL (ref 8.9–10.3)
Chloride: 102 mmol/L (ref 98–111)
Creatinine, Ser: 0.82 mg/dL (ref 0.61–1.24)
GFR, Estimated: >60 mL/min (ref >60)
Glucose, Bld: 130 mg/dL — ABNORMAL HIGH (ref 70–99)
Potassium: 4 mmol/L (ref 3.5–5.1)
Sodium: 133 mmol/L — ABNORMAL LOW (ref 135–145)
Total Bilirubin: 0.5 mg/dL (ref 0.3–1.2)

## 2021-10-19 LAB — CBC
HCT: 46.5 % (ref 39.0–52.0)
Hemoglobin: 15 g/dL (ref 13.0–17.0)
MCH: 29.8 pg (ref 26.0–34.0)
MCHC: 32.3 g/dL (ref 30.0–36.0)
MCV: 92.4 fL (ref 80.0–100.0)
Platelets: 148 10*3/uL — ABNORMAL LOW (ref 150–400)
RBC: 5.03 MIL/uL (ref 4.22–5.81)
RDW: 13.8 % (ref 11.5–15.5)
WBC: 4.9 10*3/uL (ref 4.0–10.5)
nRBC: 0 % (ref 0.0–0.2)

## 2021-10-19 LAB — LIPASE, BLOOD: Lipase: 35 U/L (ref 11–51)

## 2021-10-19 LAB — TROPONIN I (HIGH SENSITIVITY)
Troponin I (High Sensitivity): 6 ng/L (ref <18)
Troponin I (High Sensitivity): 8 ng/L (ref <18)

## 2021-10-19 MED ORDER — LIDOCAINE 5 % EX PTCH
1.0000 | MEDICATED_PATCH | CUTANEOUS | Status: DC
  Administered 2021-10-19: 1 via TRANSDERMAL
  Filled 2021-10-19: qty 1

## 2021-10-19 MED ORDER — ACETAMINOPHEN 500 MG PO TABS: 500.0000 mg | ORAL_TABLET | Freq: Four times a day (QID) | ORAL | 0 refills | Status: AC | PRN

## 2021-10-19 MED ORDER — ALUM & MAG HYDROXIDE-SIMETH 200-200-20 MG/5ML PO SUSP
30.0000 mL | Freq: Once | ORAL | Status: AC
  Administered 2021-10-19: 30 mL via ORAL
  Filled 2021-10-19: qty 30

## 2021-10-19 MED ORDER — FAMOTIDINE IN NACL 20-0.9 MG/50ML-% IV SOLN
20.0000 mg | Freq: Once | INTRAVENOUS | Status: AC
  Administered 2021-10-19: 20 mg via INTRAVENOUS
  Filled 2021-10-19: qty 50

## 2021-10-19 MED ORDER — ACETAMINOPHEN 325 MG PO TABS
325.0000 mg | ORAL_TABLET | Freq: Once | ORAL | Status: AC
  Administered 2021-10-19: 325 mg via ORAL
  Filled 2021-10-19: qty 1

## 2021-10-19 MED ORDER — ASPIRIN 81 MG PO CHEW
CHEWABLE_TABLET | ORAL | Status: AC
  Filled 2021-10-19: qty 4

## 2021-10-19 MED ORDER — LIDOCAINE 5 % EX PTCH: 1.0000 | MEDICATED_PATCH | CUTANEOUS | 0 refills | Status: AC

## 2021-10-19 MED ORDER — ASPIRIN 81 MG PO CHEW
324.0000 mg | CHEWABLE_TABLET | Freq: Once | ORAL | Status: AC
  Administered 2021-10-19: 324 mg via ORAL

## 2021-10-19 MED ORDER — SODIUM CHLORIDE 0.9 % IV BOLUS
500.0000 mL | Freq: Once | INTRAVENOUS | Status: AC
  Administered 2021-10-19: 500 mL via INTRAVENOUS

## 2021-10-19 MED ORDER — LIDOCAINE VISCOUS HCL 2 % MT SOLN
15.0000 mL | Freq: Once | OROMUCOSAL | Status: AC
  Administered 2021-10-19: 15 mL via ORAL
  Filled 2021-10-19: qty 15

## 2021-10-19 MED ORDER — OMEPRAZOLE 20 MG PO CPDR: 20.0000 mg | DELAYED_RELEASE_CAPSULE | Freq: Every day | ORAL | 0 refills | Status: AC

## 2021-10-19 NOTE — ED Notes (Signed)
[facility] here to transport patient. 

## 2021-10-19 NOTE — ED Triage Notes (Signed)
Pt presents with constant left side chest pain that radiates to left arm & neck with some abdominal pain, weakness, and shortness of breath X 4 days.

## 2021-10-19 NOTE — ED Notes (Signed)
Patient is being discharged from the Urgent Care and sent to the Emergency Department via [place] . Per Dr Alphonzo Cruise, patient is in need of higher level of care due to Chaest Pain, SOB, & Weakness. Patient is aware and verbalizes understanding of plan of care.    Vitals:   10/19/21 1311 10/19/21 1313  BP: (!) 142/101   Pulse: 61   Resp: 17   Temp:  98.1 F (36.7 C)  SpO2: 100%       Charge Nurse called & given report

## 2021-10-19 NOTE — Progress Notes (Signed)
Dr. Harrington Challenger fielded ER call from this patient - per her note, "Pt came to ED with constant CP x days Exertional component but trop negative Relieved with GI cocktail Will call him to set up appt " I have sent a message to our office's scheduling team requesting a follow-up appointment, and our office will call the patient with this information.

## 2021-10-19 NOTE — ED Notes (Signed)
Pt administered Aspirin 324mg  PO per verbal order from Dr Alphonzo Cruise.

## 2021-10-19 NOTE — ED Provider Notes (Signed)
HPI  SUBJECTIVE:  [name] [name] is a 70 y.o. male who presents with  3 to 4 days of worsening left-sided chest pain described as dull, sharp, aching and pressure-like.  It has become constant.  It radiates up his left neck and into his left arm.  He reports shortness of breath, worsening dyspnea on exertion, palpitations.  It does not radiate to his back.  he denies accompanying nausea or diaphoresis.  He has tried taking hot showers and Neurontin without improvement in his symptoms.  Symptoms are worse with exertion.  He has a past medical history of MI, status post stent, cardiomyopathy, diabetes, hypertension, peptic ulcer disease, hypercholesterolemia peripheral vascular disease, status post bilateral femoral artery bypass graft.  He has not taking any of his medications for any of these conditions in 2 years.  He states that this pain feels exactly the same as his previous MI.  He has not taken any aspirin today.   Past Medical History:  Diagnosis Date   Arthritis    Diabetes mellitus without complication (HCC)    GERD (gastroesophageal reflux disease)    Hypertension    PAD (peripheral artery disease) ([place])    Peptic ulcer     Past Surgical History:  Procedure Laterality Date   ABDOMINAL AORTAGRAM N/A 11/11/2011   Procedure: ABDOMINAL Maxcine Ham;  Surgeon: Angelia Mould, MD;  Location: [facility] CATH LAB;  Service: Cardiovascular;  Laterality: N/A;   ABDOMINAL AORTOGRAM W/LOWER EXTREMITY N/A 11/13/2017   Procedure: ABDOMINAL AORTOGRAM W/LOWER EXTREMITY;  Surgeon: Serafina Mitchell, MD;  Location: [place] CV LAB;  Service: Cardiovascular;  Laterality: N/A;   AORTA - BILATERAL FEMORAL ARTERY BYPASS GRAFT  11/25/2011   Procedure: AORTA BIFEMORAL BYPASS GRAFT;  Surgeon: Rosetta Posner, MD;  Location: [facility] OR;  Service: Vascular;  Laterality: N/A;   AORTA - BILATERAL FEMORAL ARTERY BYPASS GRAFT Left 03/15/2020   Procedure: THROMBECTOMY OF left  limb of AORTA BIFEMORAL BYPASS GRAFT with  interposition graft of left aortobifemoral limb to deep femoral artery;  Surgeon: Rosetta Posner, MD;  Location: [place];  Service: Vascular;  Laterality: Left;   COLONOSCOPY     FALSE ANEURYSM REPAIR  03/15/2020   Procedure: Resection of left femoral False Aneurysm;  Surgeon: Rosetta Posner, MD;  Location: [place];  Service: Vascular;;   FEMORAL-FEMORAL BYPASS GRAFT Right 11/14/2017   Procedure: REDO BYPASS  FEMORAL-FEMORAL ARTERY EXPOSURE;  Surgeon: Serafina Mitchell, MD;  Location: [place];  Service: Vascular;  Laterality: Right;   FEMORAL-POPLITEAL BYPASS GRAFT Left 07/28/2015   Procedure: LEFT FEMORAL-POPLITEAL ARTERY BYPASS GRAFT;  Surgeon: Rosetta Posner, MD;  Location: [place];  Service: Vascular;  Laterality: Left;   ILIAC ARTERY STENT  05/06/2011   INGUINAL HERNIA REPAIR Right    LAPAROTOMY N/A 12/01/2015   Procedure: EXPLORATORY LAPAROTOMY WITH RADICAL PERITONEAL DEBRIDEMENT, CLOSURE OF SMALL BOWEL PERFORATION AND LYSIS OF ADHESIONS.;  Surgeon: Johnathan Hausen, MD;  Location: [facility] ORS;  Service: General;  Laterality: N/A;   LEFT HEART CATHETERIZATION WITH CORONARY ANGIOGRAM N/A 06/07/2013   Procedure: LEFT HEART CATHETERIZATION WITH CORONARY ANGIOGRAM;  Surgeon: Sherren Mocha, MD;  Location[facility] C[facility] LAB;  Service: Cardiovascular;  Laterality: N/A;   LEFT HEART CATHETERIZATION WITH CORONARY ANGIOGRAM N/A 12/10/2013   Procedure: LEFT HEART CATHETERIZATION WITH CORONARY ANGIOGRAM;  Surgeon: Blane Ohara, MD;  Location: [facility] CATH LAB;  Service: Cardiovascular;  Laterality: N/A;   LOWER EXTREMITY ANGIOGRAM Bilateral 11/11/2011   Procedure: LOWER EXTREMITY ANGIOGRAM;  Surgeon: Harrell Gave  Nicole Cella, MD;  Location: [facility] CATH LAB;  Service: Cardiovascular;  Laterality: Bilateral;   PATCH ANGIOPLASTY N/A 11/14/2017   Procedure: [place];  Surgeon: Serafina [place], MD;  Location: [facility] OR;  Service: Vascular;  Laterality: N/A;   PERCUTANEOUS CORONARY STENT INTERVENTION (PCI-S) Right  06/07/2013   Procedure: PERCUTANEOUS CORONARY STENT INTERVENTION (PCI-S);  Surgeon: Sherren Mocha, MD;  Location: [facility] CATH LAB;  Service: Cardiovascular;  Laterality: Right;   PERIPHERAL VASCULAR CATHETERIZATION N/A 07/26/2015   Procedure: Abdominal Aortogram;  Surgeon: Serafina Mitchell, MD;  Location: [place] CV LAB;  Service: Cardiovascular;  Laterality: N/A;   PERIPHERAL VASCULAR CATHETERIZATION Bilateral 07/26/2015   Procedure: Lower Extremity Angiography;  Surgeon: Serafina Mitchell, MD;  Location: [place] CV LAB;  Service: Cardiovascular;  Laterality: Bilateral;   PR VEIN BYPASS GRAFT,AORTO-FEM-POP  11/24/2012   THROMBECTOMY Left 03/15/2020   Thrombectomy of left limb    THROMBECTOMY FEMORAL ARTERY N/A 11/14/2017   Procedure: THROMBECTOMY AORTA  BI-FEMORAL;  Surgeon: Serafina Mitchell, MD;  Location: [facility] OR;  Service: Vascular;  Laterality: N/A;   THROMBECTOMY W/ EMBOLECTOMY Left 03/16/2020   Procedure: LEFT LEG THROMBECTOMY with REVISION PROFUNDA BYPASS GRAFT;  Surgeon: Rosetta Posner, MD;  Location: [facility] OR;  Service: Vascular;  Laterality: Left;   TONSILLECTOMY     ULTRASOUND GUIDANCE FOR VASCULAR ACCESS  11/13/2017   Procedure: Ultrasound Guidance For Vascular Access;  Surgeon: Serafina Mitchell, MD;  Location: [place] CV LAB;  Service: Cardiovascular;;    Family History  Problem Relation Age of Onset   Hypertension Mother    Heart attack Mother    Diabetes Mother    Heart disease Mother    Aneurysm Father    Heart disease Father    Heart attack Sister    Heart disease Sister    Heart disease Brother        heart transplant    Social History   Tobacco Use   Smoking status: Every Day    Packs/day: 0.25    Years: 46.00    Pack years: 11.50    Types: Cigarettes   Smokeless tobacco: Never   Tobacco comments:    12 cigarettes per day.   Vaping Use   Vaping Use: Never used  Substance Use Topics   Alcohol use: No    Alcohol/week: 0.0 standard drinks    Comment:  a12/17/20214 "last drink was 3-4 yr ago; never had problem w/it"   Drug use: No    Frequency: 1.0 times per week    Types: Marijuana, Cocaine    Comment: stopped marijuana May 07, 2012, stopped cocaine 1980    No current facility-administered medications for this encounter.  Current Outpatient Medications:    acetaminophen (TYLENOL) 500 MG tablet, Take 500 mg by mouth every 6 (six) hours as needed (for headaches). , Disp: , Rfl:    dorzolamide-timolol (COSOPT) 22.3-6.8 MG/ML ophthalmic solution, 1 drop 2 (two) times daily., Disp: , Rfl:    gabapentin (NEURONTIN) 300 MG capsule, Take 300 mg by mouth 3 (three) times daily as needed (pain). , Disp: , Rfl:    methocarbamol (ROBAXIN) 500 MG tablet, Take 500 mg by mouth 2 (two) times daily as needed for muscle spasms. , Disp: , Rfl:    zolpidem (AMBIEN) 10 MG tablet, Take 10 mg by mouth at bedtime as needed for sleep. , Disp: , Rfl:   Allergies  Allergen Reactions   Ezetimibe Anaphylaxis and Swelling  Tongue and throat swell   Atorvastatin Other (See Comments) and Rash    Malaise & muscle weakness   Crestor [Rosuvastatin] Rash and Other (See Comments)    Malaise & muscle weakness   Lisinopril Rash   Pravastatin Sodium Other (See Comments)    "Pravastatin 40 mg qday and 40 mg q M/W/F caused muscle aches"    Promethazine Rash     ROS  As noted in HPI.   Physical Exam  BP (!) 142/101 (BP Location: Right Arm)   Pulse 61   Temp 98.1 F (36.7 C) (Oral)   Resp 17   SpO2 100%   Constitutional: Well developed, well nourished, no acute distress Eyes:  EOMI, conjunctiva normal bilaterally HENT: Normocephalic, atraumatic,mucus membranes moist Respiratory: Normal inspiratory effort, lungs clear bilaterally Cardiovascular: Normal rate, regular rhythm, no murmurs rubs or gallop GI: nondistended skin: No rash, skin intact Musculoskeletal: no deformities Neurologic: Alert & oriented x 3, no focal neuro deficits Psychiatric: Speech  and behavior appropriate   ED Course   Medications - No data to display  Orders Placed This Encounter  Procedures   ED EKG    Standing Status:   Standing    Number of Occurrences:   1    Order Specific Question:   Reason for Exam    Answer:   Chest Pain    No results found for this or any previous visit (from the past 24 hour(s)). No results found.  ED Clinical Impression  1. Chest pain, unspecified type      ED Assessment/Plan  EKG: Sinus rhythm with occasional PVCs.  Normal axis.  No hypertrophy.  Q waves in V1 through V3 present on EKG from 12/21.  No ST-T wave elevation.  Patient symptomatic while EKG was obtained.  Concern for recurrent ACS, especially as he has not been on any of his medications for multiple medical issues in 2 years.  Giving 324 mg of aspirin here, establishing IV access.  Will transfer to the emergency department via [place].  Discussed rationale for transfer to the emergency department with patient.  He agrees with plan.  No orders of the defined types were placed in this encounter.     *This clinic note was created using Dragon dictation software. Therefore, there may be occasional mistakes despite careful proofreading.  ?    Melynda Ripple, MD 10/19/21 2036

## 2021-10-19 NOTE — ED Triage Notes (Signed)
Pt bib PTAR from an urgent care for further evaluation d/t CP. Non-complaint w his meds. A&O X4.   324 aspirin given by PTAR.

## 2021-10-19 NOTE — ED Provider Notes (Signed)
[place] EMERGENCY DEPARTMENT Provider Note   CSN: [id number] Arrival date & time: 10/19/21  1348     History Chief Complaint  Patient presents with   Chest Pain    [name] [name] is a 70 y.o. male with a past medical history of hypertension, MI who presents to the ED brought in by [facility] from urgent care complaining of left-sided chest pain onset 2-3 days.  Has left-sided chest pain radiates to his left shoulder and left neck.  Patient reports he has not been compliant with his medications.  Patient has associated shortness of breath and abdominal pain.  He has been given aspirin by PT with no relief of his symptoms.  He denies fever, chills, nausea, vomiting.   The history is provided by the patient. No language interpreter was used.  Chest Pain Pain location:  L chest Pain radiates to:  L shoulder and neck Pain severity:  Moderate Onset quality:  Sudden Duration:  3 days Timing:  Constant Progression:  Unchanged Chronicity:  New Relieved by:  None tried Worsened by:  Nothing Ineffective treatments:  None tried Associated symptoms: abdominal pain and shortness of breath   Associated symptoms: no cough, no fever, no nausea and no vomiting       Past Medical History:  Diagnosis Date   Arthritis    Diabetes mellitus without complication (HCC)    GERD (gastroesophageal reflux disease)    Hypertension    PAD (peripheral artery disease) (HCC)    Peptic ulcer     Patient Active Problem List   Diagnosis Date Noted   Critical lower limb ischemia ([place]) 03/15/2020   CAD (coronary artery disease) 12/02/2018   Essential (primary) hypertension 12/02/2018   Opiate dependence ([place]) 06/22/2018   PAD (peripheral artery disease) ([place]) 11/13/2017   Mood disorder ([place]) 01/28/2016   Depression 01/28/2016   Acute blood loss anemia 01/06/2016   Polyneuropathy 01/06/2016   Hypomagnesemia 12/16/2015   Demand ischemia on 1/20 from acute blood loss anemia ([place])  12/16/2015   Abdominal wall fluid collections    Acute encephalopathy 12/12/2015   Seizures ([place]) 12/12/2015   Acute kidney injury ([place]) 12/12/2015   Controlled diabetes mellitus with circulatory complication, without long-term current use of insulin ([place]) 12/12/2015   Anemia of chronic disease 12/12/2015   Sepsis ([place]) 12/12/2015   Leukocytosis 12/12/2015   Thrombocytopenia ([place]) 12/12/2015   Palliative care encounter    Acute respiratory failure with hypoxemia ([place]) 12/06/2015   DVT of axillary vein, acute right ([place])    Small bowel obstruction & perforation s/p OR exploration & repair 12/01/2015 12/02/2015   Purulent peritonitis ([place]) 12/02/2015   Protein-calorie malnutrition, severe ([place]) 07/30/2015    Past Surgical History:  Procedure Laterality Date   ABDOMINAL AORTAGRAM N/A 11/11/2011   Procedure: ABDOMINAL Maxcine Ham;  Surgeon: Angelia Mould, MD;  Location: [facility] CATH LAB;  Service: Cardiovascular;  Laterality: N/A;   ABDOMINAL AORTOGRAM W/LOWER EXTREMITY N/A 11/13/2017   Procedure: ABDOMINAL AORTOGRAM W/LOWER EXTREMITY;  Surgeon: Serafina Mitchell, MD;  Location: [facility] CV LAB;  Service: Cardiovascular;  Laterality: N/A;   AORTA - BILATERAL FEMORAL ARTERY BYPASS GRAFT  11/25/2011   Procedure: AORTA BIFEMORAL BYPASS GRAFT;  Surgeon: Rosetta Posner, MD;  Location: [facility] OR;  Service: Vascular;  Laterality: N/A;   AORTA - BILATERAL FEMORAL ARTERY BYPASS GRAFT Left 03/15/2020   Procedure: THROMBECTOMY OF left  limb of AORTA BIFEMORAL BYPASS GRAFT with interposition graft of left aortobifemoral limb to deep femoral  artery;  Surgeon: Rosetta Posner, MD;  Location: [facility];  Service: Vascular;  Laterality: Left;   COLONOSCOPY     FALSE ANEURYSM REPAIR  03/15/2020   Procedure: Resection of left femoral False Aneurysm;  Surgeon: Rosetta Posner, MD;  Location: [facility];  Service: Vascular;;   FEMORAL-FEMORAL BYPASS GRAFT Right 11/14/2017   Procedure: REDO BYPASS  FEMORAL-FEMORAL ARTERY  EXPOSURE;  Surgeon: Serafina Mitchell, MD;  Location: [facility] OR;  Service: Vascular;  Laterality: Right;   FEMORAL-POPLITEAL BYPASS GRAFT Left 07/28/2015   Procedure: LEFT FEMORAL-POPLITEAL ARTERY BYPASS GRAFT;  Surgeon: Rosetta Posner, MD;  Location: [facility];  Service: Vascular;  Laterality: Left;   ILIAC ARTERY STENT  05/06/2011   INGUINAL HERNIA REPAIR Right    LAPAROTOMY N/A 12/01/2015   Procedure: EXPLORATORY LAPAROTOMY WITH RADICAL PERITONEAL DEBRIDEMENT, CLOSURE OF SMALL BOWEL PERFORATION AND LYSIS OF ADHESIONS.;  Surgeon: Johnathan Hausen, MD;  Location: [facility] ORS;  Service: General;  Laterality: N/A;   LEFT HEART CATHETERIZATION WITH CORONARY ANGIOGRAM N/A 06/07/2013   Procedure: LEFT HEART CATHETERIZATION WITH CORONARY ANGIOGRAM;  Surgeon: Sherren Mocha, MD;  Location: [facility] CATH LAB;  Service: Cardiovascular;  Laterality: N/A;   LEFT HEART CATHETERIZATION WITH CORONARY ANGIOGRAM N/A 12/10/2013   Procedure: LEFT HEART CATHETERIZATION WITH CORONARY ANGIOGRAM;  Surgeon: Blane Ohara, MD;  Location: [facility] CATH LAB;  Service: Cardiovascular;  Laterality: N/A;   LOWER EXTREMITY ANGIOGRAM Bilateral 11/11/2011   Procedure: LOWER EXTREMITY ANGIOGRAM;  Surgeon: Angelia Mould, MD;  Location: [facility] CATH LAB;  Service: Cardiovascular;  Laterality: Bilateral;   PATCH ANGIOPLASTY N/A 11/14/2017   Procedure: [place];  Surgeon: Serafina Mitchell, MD;  Location: [facility] OR;  Service: Vascular;  Laterality: N/A;   PERCUTANEOUS CORONARY STENT INTERVENTION (PCI-S) Right 06/07/2013   Procedure: PERCUTANEOUS CORONARY STENT INTERVENTION (PCI-S);  Surgeon: Sherren Mocha, MD;  Location: [facility] CATH LAB;  Service: Cardiovascular;  Laterality: Right;   PERIPHERAL VASCULAR CATHETERIZATION N/A 07/26/2015   Procedure: Abdominal Aortogram;  Surgeon: Serafina Mitchell, MD;  Location: [place] CV LAB;  Service: Cardiovascular;  Laterality: N/A;   PERIPHERAL VASCULAR CATHETERIZATION Bilateral 07/26/2015    Procedure: Lower Extremity Angiography;  Surgeon: Serafina Mitchell, MD;  Location: [place] CV LAB;  Service: Cardiovascular;  Laterality: Bilateral;   PR VEIN BYPASS GRAFT,AORTO-FEM-POP  11/24/2012   THROMBECTOMY Left 03/15/2020   Thrombectomy of left limb    THROMBECTOMY FEMORAL ARTERY N/A 11/14/2017   Procedure: THROMBECTOMY AORTA  BI-FEMORAL;  Surgeon: Serafina Mitchell, MD;  Location: [facility] OR;  Service: Vascular;  Laterality: N/A;   THROMBECTOMY W/ EMBOLECTOMY Left 03/16/2020   Procedure: LEFT LEG THROMBECTOMY with REVISION PROFUNDA BYPASS GRAFT;  Surgeon: Rosetta Posner, MD;  Location: [facility] OR;  Service: Vascular;  Laterality: Left;   TONSILLECTOMY     ULTRASOUND GUIDANCE FOR VASCULAR ACCESS  11/13/2017   Procedure: Ultrasound Guidance For Vascular Access;  Surgeon: Serafina Mitchell, MD;  Location: [place] CV LAB;  Service: Cardiovascular;;       Family History  Problem Relation Age of Onset   Hypertension Mother    Heart attack Mother    Diabetes Mother    Heart disease Mother    Aneurysm Father    Heart disease Father    Heart attack Sister    Heart disease Sister    Heart disease Brother        heart transplant    Social History   Tobacco Use   Smoking  status: Every Day    Packs/day: 0.25    Years: 46.00    Pack years: 11.50    Types: Cigarettes   Smokeless tobacco: Never   Tobacco comments:    12 cigarettes per day.   Vaping Use   Vaping Use: Never used  Substance Use Topics   Alcohol use: No    Alcohol/week: 0.0 standard drinks    Comment: a12/17/20214 "last drink was 3-4 yr ago; never had problem w/it"   Drug use: No    Frequency: 1.0 times per week    Types: Marijuana, Cocaine    Comment: stopped marijuana May 07, 2012, stopped cocaine 1980    Home Medications Prior to Admission medications   Medication Sig Start Date End Date Taking? Authorizing Provider  acetaminophen (TYLENOL) 500 MG tablet Take 1 tablet (500 mg total) by mouth every 6 (six)  hours as needed. 10/19/21  Yes Sharisse Rantz A, PA-C  lidocaine (LIDODERM) 5 % Place 1 patch onto the skin daily. Remove & Discard patch within 12 hours or as directed by MD 10/19/21  Yes Alioune Hodgkin A, PA-C  omeprazole (PRILOSEC) 20 MG capsule Take 1 capsule (20 mg total) by mouth daily. 10/19/21 11/18/21 Yes Jacquel Mccamish A, PA-C    Allergies    Ezetimibe, Atorvastatin, Crestor [rosuvastatin], Lisinopril, Pravastatin sodium, and Promethazine  Review of Systems   Review of Systems  Constitutional:  Negative for chills and fever.  Respiratory:  Positive for shortness of breath. Negative for cough.   Cardiovascular:  Positive for chest pain.  Gastrointestinal:  Positive for abdominal pain. Negative for nausea and vomiting.  Skin:  Negative for rash.  All other systems reviewed and are negative.  Physical Exam Updated Vital Signs BP (!) 148/78 (BP Location: Right Arm)   Pulse (!) 49   Temp 98 F (36.7 C) (Oral)   Resp 15   SpO2 99%   Physical Exam  ED Results / Procedures / Treatments   Labs (all labs ordered are listed, but only abnormal results are displayed) Labs Reviewed  CBC - Abnormal; Notable for the following components:      Result Value   Platelets 148 (*)    All other components within normal limits  COMPREHENSIVE METABOLIC PANEL - Abnormal; Notable for the following components:   Sodium 133 (*)    CO2 20 (*)    Glucose, Bld 130 (*)    All other components within normal limits  LIPASE, BLOOD  TROPONIN I (HIGH SENSITIVITY)  TROPONIN I (HIGH SENSITIVITY)    EKG EKG Interpretation  Date/Time:  Friday October 19 2021 13:55:57 EST Ventricular Rate:  51 PR Interval:  162 QRS Duration: 112 QT Interval:  451 QTC Calculation: 416 R Axis:   62 Text Interpretation: Sinus rhythm Anterior infarct, old Minimal ST depression, inferior leads Similar to prior tracing 10/15 Confirmed by Wynona Dove ([id number]) on 10/19/2021 2:08:43 PM  Radiology DG Chest Port 1  View  Result Date: 10/19/2021 CLINICAL DATA:  Chest and stomach pain. EXAM: PORTABLE CHEST 1 VIEW COMPARISON:  02/19/2016 FINDINGS: Grossly unchanged cardiac silhouette and mediastinal contours. There is persistent mild elevation involving the lateral aspect of the right hemidiaphragm, unchanged. The lungs remain hyperexpanded with flattening of the bilateral hemidiaphragms. No focal airspace opacities. No pleural effusion or pneumothorax. No evidence of edema. No acute osseous abnormalities. IMPRESSION: Similar findings of lung hyperexpansion without superimposed acute cardiopulmonary disease. Electronically Signed   By: Sandi Mariscal M.D.   On: 10/19/2021 14:25  Procedures Procedures   Medications Ordered in ED Medications  lidocaine (LIDODERM) 5 % 1 patch (1 patch Transdermal Patch Applied 10/19/21 1618)  alum & mag hydroxide-simeth (MAALOX/MYLANTA) 200-200-20 MG/5ML suspension 30 mL (30 mLs Oral Given 10/19/21 1427)    And  lidocaine (XYLOCAINE) 2 % viscous mouth solution 15 mL (15 mLs Oral Given 10/19/21 1427)  famotidine (PEPCID) IVPB 20 mg premix (0 mg Intravenous Stopped 10/19/21 1527)  sodium chloride 0.9 % bolus 500 mL (0 mLs Intravenous Stopped 10/19/21 1558)  acetaminophen (TYLENOL) tablet 325 mg (325 mg Oral Given 10/19/21 1617)    ED Course  I have reviewed the triage vital signs and the nursing notes.  Pertinent labs & imaging results that were available during my care of the patient were reviewed by me and considered in my medical decision making (see chart for details).  Clinical Course as of 10/19/21 1912  Fri Oct 19, 2021  1502 Pt re-evaluated. Patient resting comfortably on stretcher. Patient pain minimally improved with GI cocktail. [SB]  1541 Patient reevaluated and reported pain improved with GI cocktail.  No chest pain or chest wall tenderness to palpation at this time. Pt still with neck pain and left shoulder pain [SB]  1638 Patient reevaluated neck pain and  shoulder pain improved with Tylenol and Lidoderm patch.  Patient able to eat sandwich without abdominal pain issues. [SB]  1914 Consult with cardiologist, Dr. Harrington Challenger who informed that her office will call the patient for an outpatient follow up on Monday, 11/28 [SB]  1756 Patient reevaluated prior to discharge.  Patient asleep on stretcher.  Stressed importance of being compliant with medication and cardiology appointment follow-ups.  Patient verbalizes understanding. Pt is safe for discharge at this time. [SB]    Clinical Course User Index [SB] Tuff Clabo A, PA-C   MDM Rules/Calculators/A&P                         Patient presents to the ED with constant chest pain x 2-3 days.  His chest pain radiates to his left neck and left arm.  Patient with history of MI, stent, diabetes, hypertension.  Patient given aspirin by EMS prior to arrival to the ED. patient noncompliant with medications and noncompliant with cardiology appointments.  Differential diagnosis includes ACS, aortic dissection, pneumothorax, PE. On exam patient with tenderness to palpation to chest wall, diffuse abdominal tenderness to without acute cardiovascular, respiratory, abdominal findings. Vital signs stable, patient not hypoxic or tachycardic. EKG without acute ST/T changes. Troponins negative. CBC, CMP, and lipase unremarkable.  Chest x-ray without acute cardiopulmonary findings.   Given GI cocktail in the ED with improvement of his symptoms. No chest wall tenderness to palpation after GI cocktail given. Pt given tylenol and lidoderm patch in the ED with mild relief of his neck and shoulder pain.  Multiple times throughout the stay, patient reevaluated and noted to be asleep on stretcher resting comfortably.  Patient able to consume sandwich and soda in the ED.  EKG without acute ST/T changes, troponins negative, chest x-ray negative, low suspicion for ACS at this time.  Chest x-ray without acute findings, vital signs stable, doubt  aortic dissection or pneumothorax at this time.  Consult with on-call cardiologist, Dr. Harrington Challenger given patient's constant exertional chest pain.  Dr. Harrington Challenger recommended outpatient follow-up in their office will call the patient on Monday to schedule appointment.    Prescriptions for Prilosec, Lidoderm patches, Tylenol given at discharge.  Discussed with patient  importance of maintaining follow-up with cardiologist and compliance with medications.  Patient acknowledges and verbalizes understanding.  Patient reevaluated prior to discharge, vital signs stable, no acute distress, patient resting comfortably on stretcher.  Strict return precautions given. Supportive measures discussed with patient.  Patient appears patient safe for discharge at this time.  Follow-up as indicated discharge.   Final Clinical Impression(s) / ED Diagnoses Final diagnoses:  Chest pain, unspecified type  Generalized abdominal pain    Rx / DC Orders ED Discharge Orders          Ordered    omeprazole (PRILOSEC) 20 MG capsule  Daily        10/19/21 1824    lidocaine (LIDODERM) 5 %  Every 24 hours        10/19/21 1824    acetaminophen (TYLENOL) 500 MG tablet  Every 6 hours PRN        10/19/21 1824             Willi Borowiak A, PA-C 10/19/21 1912    Jeanell Sparrow, DO 10/20/21 1610

## 2021-10-19 NOTE — Discharge Instructions (Addendum)
You will receive a phone call from the cardiology office on Monday.  It is important to maintain follow-up with your cardiologist as well as take your medications as prescribed.  You will be prescribed Tylenol, make sure to use as prescribed.  You are prescribed a Lidoderm patch.  It is important that you remove the patch after 12 hours and place a new patch to the area.  Take the Prilosec as prescribed.  Return to the ED if you are experiencing increasing/worsening chest pain, shortness of breath, abdominal pain.

## 2021-11-22 NOTE — Progress Notes (Deleted)
Cardiology Office Note:    Date:  11/22/2021   ID:  JAQUARI RECKNER, DOB Jan 11, 1951, MRN [id number]  PCP:  Kristopher Glee., MD   [place] Providers Cardiologist:  Sherren Mocha, MD     Referring MD: Kristopher Glee., MD   No chief complaint on file.   History of Present Illness:    [name] [name] is a 70 y.o. male with a hx of: Coronary artery disease S/p NSTEMI >> PCI: DES to mRCA (CTO of OM1 tx med) Cath 1/15: OM1 100 (CTO); pRCA stent ok Ischemic cardiomyopathy EF 35-40 >> returned to normal  Peripheral arterial disease  S/p Ao-bifem bypass S/p thrombecomty of R limb of Ao-bifem bypass and patch angioplasty of CFA and PFA 12/18 S/p excision of R groin pseudoaneurysm, thrombectomy of L limb of Ao-Fem graft and PFA in 02/2020 Hypertension  Hyperlipidemia  Diabetes mellitus  Seizure d/o Hx of perforated small bowel in 12/2015 - prolonged hospitalization     Past Medical History:  Diagnosis Date   Arthritis    Diabetes mellitus without complication ([place])    GERD (gastroesophageal reflux disease)    Hypertension    PAD (peripheral artery disease) ([place])    Peptic ulcer     Past Surgical History:  Procedure Laterality Date   ABDOMINAL AORTAGRAM N/A 11/11/2011   Procedure: ABDOMINAL Maxcine Ham;  Surgeon: Angelia Mould, MD;  [facility] CATH LAB;  Service: Cardiovascular;  Laterality: N/A;   ABDOMINAL AORTOGRAM W/LOWER EXTREMITY N/A 11/13/2017   Procedure: ABDOMINAL AORTOGRAM W/LOWER EXTREMITY;  Surgeon: Serafina Mitchell, MD;  Location: [place] CV LAB;  Service: Cardiovascular;  Laterality: N/A;   AORTA - BILATERAL FEMORAL ARTERY BYPASS GRAFT  11/25/2011   Procedure: AORTA BIFEMORAL BYPASS GRAFT;  Surgeon: Rosetta Posner, MD;  Location[facility] OR;  Service: Vascular;  Laterality: N/A;   AORTA - BILATERAL FEMORAL ARTERY BYPASS GRAFT Left 03/15/2020   Procedure: THROMBECTOMY OF left  limb of AORTA BIFEMORAL BYPASS GRAFT with interposition graft of left  aortobifemoral limb to deep femoral artery;  Surgeon: Rosetta Posner, MD;  Location: [place];  Service: Vascular;  Laterality: Left;   COLONOSCOPY     FALSE ANEURYSM REPAIR  03/15/2020   Procedure: Resection of left femoral False Aneurysm;  Surgeon: Rosetta Posner, MD;  Location: [place];  Service: Vascular;;   FEMORAL-FEMORAL BYPASS GRAFT Right 11/14/2017   Procedure: REDO BYPASS  FEMORAL-FEMORAL ARTERY EXPOSURE;  Surgeon: Serafina Mitchell, MD;  Location: [place];  Service: Vascular;  Laterality: Right;   FEMORAL-POPLITEAL BYPASS GRAFT Left 07/28/2015   Procedure: LEFT FEMORAL-POPLITEAL ARTERY BYPASS GRAFT;  Surgeon: Rosetta Posner, MD;  Location: [place];  Service: Vascular;  Laterality: Left;   ILIAC ARTERY STENT  05/06/2011   INGUINAL HERNIA REPAIR Right    LAPAROTOMY N/A 12/01/2015   Procedure: EXPLORATORY LAPAROTOMY WITH RADICAL PERITONEAL DEBRIDEMENT, CLOSURE OF SMALL BOWEL PERFORATION AND LYSIS OF ADHESIONS.;  Surgeon: Johnathan Hausen, MD;  Location: [facility];  Service: General;  Laterality: N/A;   LEFT HEART CATHETERIZATION WITH CORONARY ANGIOGRAM N/A 06/07/2013   Procedure: LEFT HEART CATHETERIZATION WITH CORONARY ANGIOGRAM;  Surgeon: Sherren Mocha, MD;  Location: [facility] CATH LAB;  Service: Cardiovascular;  Laterality: N/A;   LEFT HEART CATHETERIZATION WITH CORONARY ANGIOGRAM N/A 12/10/2013   Procedure: LEFT HEART CATHETERIZATION WITH CORONARY ANGIOGRAM;  Surgeon: Blane Ohara, MD;  Location: [facility] CATH LAB;  Service: Cardiovascular;  Laterality: N/A;   LOWER EXTREMITY ANGIOGRAM Bilateral 11/11/2011   Procedure: LOWER  EXTREMITY ANGIOGRAM;  Surgeon: Angelia Mould, MD;  Location: [facility] CATH LAB;  Service: Cardiovascular;  Laterality: Bilateral;   PATCH ANGIOPLASTY N/A 11/14/2017   Procedure: [place];  Surgeon: Serafina Mitchell, MD;  Location: [facility] OR;  Service: Vascular;  Laterality: N/A;   PERCUTANEOUS CORONARY STENT INTERVENTION (PCI-S) Right 06/07/2013   Procedure:  PERCUTANEOUS CORONARY STENT INTERVENTION (PCI-S);  Surgeon: Sherren Mocha, MD;  Location: [facility] CATH LAB;  Service: Cardiovascular;  Laterality: Right;   PERIPHERAL VASCULAR CATHETERIZATION N/A 07/26/2015   Procedure: Abdominal Aortogram;  Surgeon: Serafina Mitchell, MD;  Location: [place] CV LAB;  Service: Cardiovascular;  Laterality: N/A;   PERIPHERAL VASCULAR CATHETERIZATION Bilateral 07/26/2015   Procedure: Lower Extremity Angiography;  Surgeon: Serafina Mitchell, MD;  Location: [place] CV LAB;  Service: Cardiovascular;  Laterality: Bilateral;   PR VEIN BYPASS GRAFT,AORTO-FEM-POP  11/24/2012   THROMBECTOMY Left 03/15/2020   Thrombectomy of left limb    THROMBECTOMY FEMORAL ARTERY N/A 11/14/2017   Procedure: THROMBECTOMY AORTA  BI-FEMORAL;  Surgeon: Serafina Mitchell, MD;  [facility] OR;  Service: Vascular;  Laterality: N/A;   THROMBECTOMY W/ EMBOLECTOMY Left 03/16/2020   Procedure: LEFT LEG THROMBECTOMY with REVISION PROFUNDA BYPASS GRAFT;  Surgeon: Rosetta Posner, MD;  [facility] OR;  Service: Vascular;  Laterality: Left;   TONSILLECTOMY     ULTRASOUND GUIDANCE FOR VASCULAR ACCESS  11/13/2017   Procedure: Ultrasound Guidance For Vascular Access;  Surgeon: Serafina Mitchell, MD;  Location: [place] CV LAB;  Service: Cardiovascular;;    Current Medications: No outpatient medications have been marked as taking for the 11/23/21 encounter (Appointment) with Sherren Mocha, MD.     Allergies:   Ezetimibe, Atorvastatin, Crestor [rosuvastatin], Lisinopril, Pravastatin sodium, and Promethazine   Social History   Socioeconomic History   Marital status: Single    Spouse name: Not on file   Number of children: Not on file   Years of education: Not on file   Highest education level: Not on file  Occupational History   Not on file  Tobacco Use   Smoking status: Every Day    Packs/day: 0.25    Years: 46.00    Pack years: 11.50    Types: Cigarettes   Smokeless tobacco: Never    Tobacco comments:    12 cigarettes per day.   Vaping Use   Vaping Use: Never used  Substance and Sexual Activity   Alcohol use: No    Alcohol/week: 0.0 standard drinks    Comment: a12/17/20214 "last drink was 3-4 yr ago; never had problem w/it"   Drug use: No    Frequency: 1.0 times per week    Types: Marijuana, Cocaine    Comment: stopped marijuana May 07, 2012, stopped cocaine 1980   Sexual activity: Not Currently  Other Topics Concern   Not on file  Social History Narrative   Not on file   Social Determinants of Health   Financial Resource Strain: Not on file  Food Insecurity: Not on file  Transportation Needs: Not on file  Physical Activity: Not on file  Stress: Not on file  Social Connections: Not on file     Family History: The patient's ***family history includes Aneurysm in his father; Diabetes in his mother; Heart attack in his mother and sister; Heart disease in his brother, father, mother, and sister; Hypertension in his mother.  ROS:   Please see the history of present illness.    *** All other  systems reviewed and are negative.  EKGs/Labs/Other Studies Reviewed:    The following studies were reviewed today: ***  EKG:  EKG is *** ordered today.  The ekg ordered today demonstrates ***  Recent Labs: 10/19/2021: ALT 13; BUN 19; Creatinine, Ser 0.82; Hemoglobin 15.0; Platelets 148; Potassium 4.0; Sodium 133  Recent Lipid Panel    Component Value Date/Time   CHOL 154 11/24/2015 0445   TRIG 115 12/25/2015 0450   HDL 36 (L) 11/24/2015 0445   CHOLHDL 4.3 11/24/2015 0445   VLDL 22 11/24/2015 0445   LDLCALC 96 11/24/2015 0445     Risk Assessment/Calculations:   {Does this patient have ATRIAL FIBRILLATION?:[id number]}       Physical Exam:    VS:  There were no vitals taken for this visit.    Wt Readings from Last 3 Encounters:  10/30/20 160 lb (72.6 kg)  04/04/20 150 lb (68 kg)  03/27/20 153 lb (69.4 kg)     GEN: *** Well nourished, well  developed in no acute distress HEENT: Normal NECK: No JVD; No carotid bruits LYMPHATICS: No lymphadenopathy CARDIAC: ***RRR, no murmurs, rubs, gallops RESPIRATORY:  Clear to auscultation without rales, wheezing or rhonchi  ABDOMEN: Soft, non-tender, non-distended MUSCULOSKELETAL:  No edema; No deformity  SKIN: Warm and dry NEUROLOGIC:  Alert and oriented x 3 PSYCHIATRIC:  Normal affect   ASSESSMENT:    1. Coronary artery disease involving native coronary artery of native heart with angina pectoris ([place])   2. PAOD (peripheral arterial occlusive disease) ([place])   3. Essential (primary) hypertension   4. Mixed hyperlipidemia    PLAN:    In order of problems listed above:  ***      {Are you ordering a CV Procedure (e.g. stress test, cath, DCCV, TEE, etc)?   Press F2        :[id number]}    Medication Adjustments/Labs and Tests Ordered: Current medicines are reviewed at length with the patient today.  Concerns regarding medicines are outlined above.  No orders of the defined types were placed in this encounter.  No orders of the defined types were placed in this encounter.   There are no Patient Instructions on file for this visit.   Signed, Sherren Mocha, MD  11/22/2021 9:50 PM    [place]

## 2021-11-23 ENCOUNTER — Ambulatory Visit: Payer: Medicare Other | Admitting: Cardiovascular Disease

## 2022-02-26 ENCOUNTER — Encounter: Payer: Self-pay | Admitting: Cardiovascular Disease

## 2022-02-26 ENCOUNTER — Ambulatory Visit (INDEPENDENT_AMBULATORY_CARE_PROVIDER_SITE_OTHER): Payer: Medicare Other | Admitting: Cardiovascular Disease

## 2022-02-26 VITALS — BP 140/100 | HR 74 | Ht 75.0 in | Wt 146.6 lb

## 2022-02-26 DIAGNOSIS — I779 Disorder of arteries and arterioles, unspecified: Secondary | ICD-10-CM

## 2022-02-26 DIAGNOSIS — I2510 Atherosclerotic heart disease of native coronary artery without angina pectoris: Secondary | ICD-10-CM | POA: Diagnosis not present

## 2022-02-26 DIAGNOSIS — I10 Essential (primary) hypertension: Secondary | ICD-10-CM

## 2022-02-26 DIAGNOSIS — E782 Mixed hyperlipidemia: Secondary | ICD-10-CM | POA: Diagnosis not present

## 2022-02-26 MED ORDER — AMLODIPINE BESYLATE 5 MG PO TABS: 5.0000 mg | ORAL_TABLET | Freq: Every day | ORAL | 3 refills | Status: AC

## 2022-02-26 NOTE — Patient Instructions (Signed)
Medication Instructions:  ?START Amlodipine '5mg'$  daily ?*If you need a refill on your cardiac medications before your next appointment, please call your pharmacy* ? ? ?Lab Work: ?NONE ?If you have labs (blood work) drawn today and your tests are completely normal, you will receive your results only by: ?MyChart Message (if you have MyChart) OR ?A paper copy in the mail ?If you have any lab test that is abnormal or we need to change your treatment, we will call you to review the results. ? ? ?Testing/Procedures: ?NONE ? ? ?Follow-Up: ?At [facility], you and your health needs are our priority.  As part of our continuing mission to provide you with exceptional heart care, we have created designated Provider Care Teams.  These Care Teams include your primary Cardiologist (physician) and Advanced Practice Providers (APPs -  Physician Assistants and Nurse Practitioners) who all work together to provide you with the care you need, when you need it. ? ?We recommend signing up for the patient portal called "MyChart".  Sign up information is provided on this After Visit Summary.  MyChart is used to connect with patients for Virtual Visits (Telemedicine).  Patients are able to view lab/test results, encounter notes, upcoming appointments, etc.  Non-urgent messages can be sent to your provider as well.   ?To learn more about what you can do with MyChart, go to [email or web address].   ? ?Your next appointment:   ?1 year(s) ? ?The format for your next appointment:   ?In Person ? ?Provider:   ?Sherren Mocha, MD   ? ?  ?

## 2022-02-26 NOTE — Progress Notes (Signed)
?Cardiology Office Note:   ? ?Date:  02/26/2022  ? ?ID:  [name] [name], DOB [birth year], MRN [id number] ? ?PCP:  Kristopher Glee., MD ?  ?[facility] Providers ?Cardiologist:  Sherren Mocha, MD    ? ?Referring MD: Kristopher Glee., MD  ? ?Chief Complaint  ?Patient presents with  ? Coronary Artery Disease  ? ? ?History of Present Illness:   ? ?[name] [name] is a 71 y.o. male with a hx of: ?Coronary artery disease ?S/p NSTEMI >> PCI: DES to mRCA (CTO of OM1 tx med) ?Cath 1/15: OM1 100 (CTO); pRCA stent ok ?Ischemic cardiomyopathy ?EF 35-40 >> returned to normal  ?Peripheral arterial disease  ?S/p Ao-bifem bypass ?S/p thrombecomty of R limb of Ao-bifem bypass and patch angioplasty of CFA and PFA 12/18 ?S/p excision of R groin pseudoaneurysm, thrombectomy of L limb of Ao-Fem graft and PFA in 02/2020 ?Hypertension  ?Hyperlipidemia  ?Diabetes mellitus  ?Seizure d/o ?Hx of perforated small bowel in 12/2015 - prolonged hospitalization  ?Tobacco abuse - since age 37 ? ?The patient is here alone today.  He has no cardiac-related complaints.  He denies chest pain or pressure.  States his blood pressure is up and down but most of the time runs in the 130s over 80s.  He does have some elevated readings and he has to sit down to bring his blood pressure down.  He has been reluctant to take medications over the years he has intolerances or allergies to Zetia, atorvastatin, rosuvastatin, pravastatin.  He also has bad reactions to lisinopril.  He currently is taking no medicines.  He continues to smoke.  He denies heart palpitations, edema, orthopnea, or PND.  States that he feels better than he has felt in a long time. ? ?Past Medical History:  ?Diagnosis Date  ? Arthritis   ? Diabetes mellitus without complication ([place])   ? GERD (gastroesophageal reflux disease)   ? Hypertension   ? PAD (peripheral artery disease) ([name])   ? Peptic ulcer   ? ? ?Past Surgical History:  ?Procedure Laterality Date  ? ABDOMINAL AORTAGRAM N/A  11/11/2011  ? Procedure: ABDOMINAL AORTAGRAM;  Surgeon: Angelia Mould, MD;  Location: [facility] CATH LAB;  Service: Cardiovascular;  Laterality: N/A;  ? ABDOMINAL AORTOGRAM W/LOWER EXTREMITY N/A 11/13/2017  ? Procedure: ABDOMINAL AORTOGRAM W/LOWER EXTREMITY;  Surgeon: Serafina Mitchell, MD;  Location: [place] CV LAB;  Service: Cardiovascular;  Laterality: N/A;  ? AORTA - BILATERAL FEMORAL ARTERY BYPASS GRAFT  11/25/2011  ? Procedure: AORTA BIFEMORAL BYPASS GRAFT;  Surgeon: Rosetta Posner, MD;  Location: [place];  Service: Vascular;  Laterality: N/A;  ? [place] GRAFT Left 03/15/2020  ? Procedure: THROMBECTOMY OF left  limb of AORTA BIFEMORAL BYPASS GRAFT with interposition graft of left aortobifemoral limb to deep femoral artery;  Surgeon: Rosetta Posner, MD;  Location: [facility] OR;  Service: Vascular;  Laterality: Left;  ? COLONOSCOPY    ? FALSE ANEURYSM REPAIR  03/15/2020  ? Procedure: Resection of left femoral False Aneurysm;  Surgeon: Rosetta Posner, MD;  Location: [place];  Service: Vascular;;  ? FEMORAL-FEMORAL BYPASS GRAFT Right 11/14/2017  ? Procedure: REDO BYPASS  FEMORAL-FEMORAL ARTERY EXPOSURE;  Surgeon: Serafina Mitchell, MD;  Location: [facility] OR;  Service: Vascular;  Laterality: Right;  ? FEMORAL-POPLITEAL BYPASS GRAFT Left 07/28/2015  ? Procedure: LEFT FEMORAL-POPLITEAL ARTERY BYPASS GRAFT;  Surgeon: Rosetta Posner, MD;  Location: [place];  Service: Vascular;  Laterality: Left;  ?  ILIAC ARTERY STENT  05/06/2011  ? INGUINAL HERNIA REPAIR Right   ? LAPAROTOMY N/A 12/01/2015  ? Procedure: EXPLORATORY LAPAROTOMY WITH RADICAL PERITONEAL DEBRIDEMENT, CLOSURE OF SMALL BOWEL PERFORATION AND LYSIS OF ADHESIONS.;  Surgeon: Johnathan Hausen, MD;  Location: [facility];  Service: General;  Laterality: N/A;  ? LEFT HEART CATHETERIZATION WITH CORONARY ANGIOGRAM N/A 06/07/2013  ? Procedure: LEFT HEART CATHETERIZATION WITH CORONARY ANGIOGRAM;  Surgeon: Sherren Mocha, MD;  Location: [facility] CATH LAB;  Service: Cardiovascular;   Laterality: N/A;  ? LEFT HEART CATHETERIZATION WITH CORONARY ANGIOGRAM N/A 12/10/2013  ? Procedure: LEFT HEART CATHETERIZATION WITH CORONARY ANGIOGRAM;  Surgeon: Blane Ohara, MD;  Location: [facility] CATH LAB;  Service: Cardiovascular;  Laterality: N/A;  ? LOWER EXTREMITY ANGIOGRAM Bilateral 11/11/2011  ? Procedure: LOWER EXTREMITY ANGIOGRAM;  Surgeon: Angelia Mould, MD;  Location: [facility] CATH LAB;  Service: Cardiovascular;  Laterality: Bilateral;  ? PATCH ANGIOPLASTY N/A 11/14/2017  ? Procedure: PATCH ANGIOPLASTY COMMON FEMROAL AND PROFUNDA;  Surgeon: Serafina Mitchell, MD;  Location: [facility] OR;  Service: Vascular;  Laterality: N/A;  ? PERCUTANEOUS CORONARY STENT INTERVENTION (PCI-S) Right 06/07/2013  ? Procedure: PERCUTANEOUS CORONARY STENT INTERVENTION (PCI-S);  Surgeon: Sherren Mocha, MD;  Location: [facility] CATH LAB;  Service: Cardiovascular;  Laterality: Right;  ? PERIPHERAL VASCULAR CATHETERIZATION N/A 07/26/2015  ? Procedure: Abdominal Aortogram;  Surgeon: Serafina Mitchell, MD;  Location: [place] CV LAB;  Service: Cardiovascular;  Laterality: N/A;  ? PERIPHERAL VASCULAR CATHETERIZATION Bilateral 07/26/2015  ? Procedure: Lower Extremity Angiography;  Surgeon: Serafina Mitchell, MD;  Location: [place] CV LAB;  Service: Cardiovascular;  Laterality: Bilateral;  ? PR VEIN BYPASS GRAFT,AORTO-FEM-POP  11/24/2012  ? THROMBECTOMY Left 03/15/2020  ? Thrombectomy of left limb   ? THROMBECTOMY FEMORAL ARTERY N/A 11/14/2017  ? Procedure: THROMBECTOMY AORTA  BI-FEMORAL;  Surgeon: Serafina Mitchell, MD;  Location[facility] OR;  Service: Vascular;  Laterality: N/A;  ? THROMBECTOMY W/ EMBOLECTOMY Left 03/16/2020  ? Procedure: LEFT LEG THROMBECTOMY with REVISION PROFUNDA BYPASS GRAFT;  Surgeon: Rosetta Posner, MD;  Location[facility];  Service: Vascular;  Laterality: Left;  ? TONSILLECTOMY    ? ULTRASOUND GUIDANCE FOR VASCULAR ACCESS  11/13/2017  ? Procedure: Ultrasound Guidance For Vascular Access;  Surgeon: Serafina Mitchell, MD;  Location:  [place] CV LAB;  Service: Cardiovascular;;  ? ? ?Current Medications: ?Current Meds  ?Medication Sig  ? amLODipine (NORVASC) 5 MG tablet Take 1 tablet (5 mg total) by mouth daily.  ?  ? ?Allergies:   Ezetimibe, Atorvastatin, Crestor [rosuvastatin], Lisinopril, Pravastatin sodium, and Promethazine  ? ?Social History  ? ?Socioeconomic History  ? Marital status: Single  ?  Spouse name: Not on file  ? Number of children: Not on file  ? Years of education: Not on file  ? Highest education level: Not on file  ?Occupational History  ? Not on file  ?Tobacco Use  ? Smoking status: Every Day  ?  Packs/day: 0.25  ?  Years: 46.00  ?  Pack years: 11.50  ?  Types: Cigarettes  ? Smokeless tobacco: Never  ? Tobacco comments:  ?  12 cigarettes per day.   ?Vaping Use  ? Vaping Use: Never used  ?Substance and Sexual Activity  ? Alcohol use: No  ?  Alcohol/week: 0.0 standard drinks  ?  Comment: a12/17/20214 "last drink was 3-4 yr ago; never had problem w/it"  ? Drug use: No  ?  Frequency: 1.0 times per week  ?  Types: Marijuana,  Cocaine  ?  Comment: stopped marijuana May 07, 2012, stopped cocaine 1980  ? Sexual activity: Not Currently  ?Other Topics Concern  ? Not on file  ?Social History Narrative  ? Not on file  ? ?Social Determinants of Health  ? ?Financial Resource Strain: Not on file  ?Food Insecurity: Not on file  ?Transportation Needs: Not on file  ?Physical Activity: Not on file  ?Stress: Not on file  ?Social Connections: Not on file  ?  ? ?Family History: ?The patient's family history includes Aneurysm in his father; Diabetes in his mother; Heart attack in his mother and sister; Heart disease in his brother, father, mother, and sister; Hypertension in his mother. ? ?ROS:   ?Please see the history of present illness.    ?All other systems reviewed and are negative. ? ?EKGs/Labs/Other Studies Reviewed:   ? ?EKG:  EKG is not ordered today.  ? ?Recent Labs: ?10/19/2021: ALT 13; BUN 19; Creatinine, Ser 0.82; Hemoglobin 15.0;  Platelets 148; Potassium 4.0; Sodium 133  ?Recent Lipid Panel ?   ?Component Value Date/Time  ? CHOL 154 11/24/2015 0445  ? TRIG 115 12/25/2015 0450  ? HDL 36 (L) 11/24/2015 0445  ? CHOLHDL 4.3 11/24/2015 0445

## 2023-02-25 ENCOUNTER — Other Ambulatory Visit: Payer: Self-pay | Admitting: Internal Medicine

## 2023-02-25 DIAGNOSIS — R1032 Left lower quadrant pain: Secondary | ICD-10-CM

## 2023-02-25 DIAGNOSIS — R634 Abnormal weight loss: Secondary | ICD-10-CM

## 2023-03-19 ENCOUNTER — Ambulatory Visit
Admission: RE | Admit: 2023-03-19 | Discharge: 2023-03-19 | Disposition: A | Discharge status: Home / Self Care | Payer: Medicare Other | Source: Ambulatory Visit | Attending: Internal Medicine | Admitting: Internal Medicine

## 2023-03-19 DIAGNOSIS — R1032 Left lower quadrant pain: Secondary | ICD-10-CM

## 2023-03-19 DIAGNOSIS — R634 Abnormal weight loss: Secondary | ICD-10-CM

## 2023-03-19 MED ORDER — IOPAMIDOL (ISOVUE-300) INJECTION 61%
80.0000 mL | Freq: Once | INTRAVENOUS | Status: AC | PRN
  Administered 2023-03-19: 80 mL via INTRAVENOUS
